# Patient Record
Sex: Female | Born: 1955 | Race: White | Hispanic: No | Marital: Married | Smoking: Former smoker
Health system: Southern US, Community
[De-identification: ages and names within clinical notes are randomized; demographics above are authoritative.]

## PROBLEM LIST (undated history)

## (undated) DIAGNOSIS — E785 Hyperlipidemia, unspecified: Secondary | ICD-10-CM

## (undated) DIAGNOSIS — I890 Lymphedema, not elsewhere classified: Secondary | ICD-10-CM

## (undated) DIAGNOSIS — T4145XA Adverse effect of unspecified anesthetic, initial encounter: Secondary | ICD-10-CM

## (undated) DIAGNOSIS — R079 Chest pain, unspecified: Secondary | ICD-10-CM

## (undated) DIAGNOSIS — C801 Malignant (primary) neoplasm, unspecified: Secondary | ICD-10-CM

## (undated) DIAGNOSIS — K760 Fatty (change of) liver, not elsewhere classified: Secondary | ICD-10-CM

## (undated) DIAGNOSIS — M1990 Unspecified osteoarthritis, unspecified site: Secondary | ICD-10-CM

## (undated) DIAGNOSIS — T8189XA Other complications of procedures, not elsewhere classified, initial encounter: Secondary | ICD-10-CM

## (undated) DIAGNOSIS — M2550 Pain in unspecified joint: Secondary | ICD-10-CM

## (undated) DIAGNOSIS — G4733 Obstructive sleep apnea (adult) (pediatric): Secondary | ICD-10-CM

## (undated) DIAGNOSIS — F419 Anxiety disorder, unspecified: Secondary | ICD-10-CM

## (undated) DIAGNOSIS — K648 Other hemorrhoids: Secondary | ICD-10-CM

## (undated) DIAGNOSIS — M7989 Other specified soft tissue disorders: Secondary | ICD-10-CM

## (undated) DIAGNOSIS — M069 Rheumatoid arthritis, unspecified: Secondary | ICD-10-CM

## (undated) DIAGNOSIS — K439 Ventral hernia without obstruction or gangrene: Secondary | ICD-10-CM

## (undated) DIAGNOSIS — T8781 Dehiscence of amputation stump: Secondary | ICD-10-CM

## (undated) DIAGNOSIS — T8859XA Other complications of anesthesia, initial encounter: Secondary | ICD-10-CM

## (undated) DIAGNOSIS — I519 Heart disease, unspecified: Secondary | ICD-10-CM

## (undated) DIAGNOSIS — K644 Residual hemorrhoidal skin tags: Secondary | ICD-10-CM

## (undated) DIAGNOSIS — K829 Disease of gallbladder, unspecified: Secondary | ICD-10-CM

## (undated) DIAGNOSIS — R0600 Dyspnea, unspecified: Secondary | ICD-10-CM

## (undated) DIAGNOSIS — K460 Unspecified abdominal hernia with obstruction, without gangrene: Secondary | ICD-10-CM

## (undated) DIAGNOSIS — F32A Depression, unspecified: Secondary | ICD-10-CM

## (undated) DIAGNOSIS — I10 Essential (primary) hypertension: Secondary | ICD-10-CM

## (undated) HISTORY — DX: Unspecified abdominal hernia with obstruction, without gangrene: K46.0

## (undated) HISTORY — DX: Residual hemorrhoidal skin tags: K64.4

## (undated) HISTORY — DX: Unspecified osteoarthritis, unspecified site: M19.90

## (undated) HISTORY — DX: Residual hemorrhoidal skin tags: K64.8

## (undated) HISTORY — DX: Heart disease, unspecified: I51.9

## (undated) HISTORY — DX: Hyperlipidemia, unspecified: E78.5

## (undated) HISTORY — DX: Rheumatoid arthritis, unspecified: M06.9

## (undated) HISTORY — DX: Morbid (severe) obesity due to excess calories: E66.01

## (undated) HISTORY — DX: Obstructive sleep apnea (adult) (pediatric): G47.33

## (undated) HISTORY — DX: Other specified soft tissue disorders: M79.89

## (undated) HISTORY — PX: APPLICATION OF WOUND VAC: SHX5189

## (undated) HISTORY — DX: Essential (primary) hypertension: I10

## (undated) HISTORY — DX: Disease of gallbladder, unspecified: K82.9

## (undated) HISTORY — DX: Chest pain, unspecified: R07.9

## (undated) HISTORY — DX: Pain in unspecified joint: M25.50

## (undated) HISTORY — DX: Fatty (change of) liver, not elsewhere classified: K76.0

## (undated) HISTORY — PX: CARDIOVASCULAR STRESS TEST: SHX262

## (undated) HISTORY — DX: Depression, unspecified: F32.A

## (undated) HISTORY — PX: CHOLECYSTECTOMY: SHX55

## (undated) HISTORY — DX: Anxiety disorder, unspecified: F41.9

## (undated) HISTORY — DX: Ventral hernia without obstruction or gangrene: K43.9

---

## 2001-03-15 HISTORY — PX: CORONARY ARTERY BYPASS GRAFT: SHX141

## 2001-04-08 ENCOUNTER — Inpatient Hospital Stay (HOSPITAL_COMMUNITY): Admission: EM | Admit: 2001-04-08 | Discharge: 2001-04-20 | Payer: Self-pay | Admitting: Cardiology

## 2001-04-11 ENCOUNTER — Encounter: Payer: Self-pay | Admitting: Cardiothoracic Surgery

## 2001-04-12 ENCOUNTER — Encounter: Payer: Self-pay | Admitting: Cardiothoracic Surgery

## 2001-04-13 ENCOUNTER — Encounter: Payer: Self-pay | Admitting: Cardiothoracic Surgery

## 2001-04-14 ENCOUNTER — Encounter: Payer: Self-pay | Admitting: Cardiothoracic Surgery

## 2001-04-15 ENCOUNTER — Encounter: Payer: Self-pay | Admitting: Cardiothoracic Surgery

## 2001-04-17 ENCOUNTER — Encounter: Payer: Self-pay | Admitting: Cardiothoracic Surgery

## 2001-04-18 ENCOUNTER — Encounter: Payer: Self-pay | Admitting: Cardiothoracic Surgery

## 2001-04-19 ENCOUNTER — Encounter: Payer: Self-pay | Admitting: Cardiothoracic Surgery

## 2001-05-17 ENCOUNTER — Encounter: Payer: Self-pay | Admitting: Cardiology

## 2001-05-17 ENCOUNTER — Ambulatory Visit (HOSPITAL_COMMUNITY): Admission: RE | Admit: 2001-05-17 | Discharge: 2001-05-17 | Payer: Self-pay | Admitting: Cardiology

## 2001-05-25 ENCOUNTER — Encounter (HOSPITAL_COMMUNITY): Admission: RE | Admit: 2001-05-25 | Discharge: 2001-06-24 | Payer: Self-pay | Admitting: Cardiology

## 2001-06-26 ENCOUNTER — Encounter (HOSPITAL_COMMUNITY): Admission: RE | Admit: 2001-06-26 | Discharge: 2001-07-26 | Payer: Self-pay | Admitting: Cardiology

## 2001-07-28 ENCOUNTER — Encounter (HOSPITAL_COMMUNITY): Admission: RE | Admit: 2001-07-28 | Discharge: 2001-08-27 | Payer: Self-pay | Admitting: Cardiology

## 2001-09-04 ENCOUNTER — Encounter (HOSPITAL_COMMUNITY): Admission: RE | Admit: 2001-09-04 | Discharge: 2001-10-04 | Payer: Self-pay | Admitting: Cardiology

## 2002-04-12 ENCOUNTER — Encounter: Payer: Self-pay | Admitting: Family Medicine

## 2002-04-12 ENCOUNTER — Ambulatory Visit (HOSPITAL_COMMUNITY): Admission: RE | Admit: 2002-04-12 | Discharge: 2002-04-12 | Payer: Self-pay | Admitting: Cardiology

## 2002-04-12 ENCOUNTER — Ambulatory Visit (HOSPITAL_COMMUNITY): Admission: RE | Admit: 2002-04-12 | Discharge: 2002-04-12 | Payer: Self-pay | Admitting: Family Medicine

## 2002-04-19 ENCOUNTER — Ambulatory Visit (HOSPITAL_COMMUNITY): Admission: RE | Admit: 2002-04-19 | Discharge: 2002-04-19 | Payer: Self-pay | Admitting: Family Medicine

## 2002-04-19 ENCOUNTER — Encounter: Payer: Self-pay | Admitting: Family Medicine

## 2003-02-19 ENCOUNTER — Other Ambulatory Visit: Admission: RE | Admit: 2003-02-19 | Discharge: 2003-02-19 | Payer: Self-pay | Admitting: Family Medicine

## 2004-03-31 ENCOUNTER — Other Ambulatory Visit: Admission: RE | Admit: 2004-03-31 | Discharge: 2004-03-31 | Payer: Self-pay | Admitting: Family Medicine

## 2004-04-23 ENCOUNTER — Ambulatory Visit: Payer: Self-pay | Admitting: Cardiology

## 2005-11-08 ENCOUNTER — Other Ambulatory Visit: Admission: RE | Admit: 2005-11-08 | Discharge: 2005-11-08 | Payer: Self-pay | Admitting: Family Medicine

## 2006-07-27 ENCOUNTER — Ambulatory Visit: Payer: Self-pay | Admitting: Cardiology

## 2008-02-21 ENCOUNTER — Ambulatory Visit: Payer: Self-pay | Admitting: Cardiology

## 2008-02-26 ENCOUNTER — Ambulatory Visit: Payer: Self-pay | Admitting: Cardiology

## 2008-02-26 ENCOUNTER — Encounter (HOSPITAL_COMMUNITY): Admission: RE | Admit: 2008-02-26 | Discharge: 2008-03-13 | Payer: Self-pay | Admitting: Cardiology

## 2008-08-29 ENCOUNTER — Encounter (INDEPENDENT_AMBULATORY_CARE_PROVIDER_SITE_OTHER): Payer: Self-pay | Admitting: *Deleted

## 2008-11-12 DIAGNOSIS — I10 Essential (primary) hypertension: Secondary | ICD-10-CM | POA: Insufficient documentation

## 2008-11-12 DIAGNOSIS — E785 Hyperlipidemia, unspecified: Secondary | ICD-10-CM | POA: Insufficient documentation

## 2008-11-12 DIAGNOSIS — E669 Obesity, unspecified: Secondary | ICD-10-CM | POA: Insufficient documentation

## 2008-11-12 DIAGNOSIS — I2510 Atherosclerotic heart disease of native coronary artery without angina pectoris: Secondary | ICD-10-CM | POA: Insufficient documentation

## 2008-11-13 ENCOUNTER — Ambulatory Visit: Payer: Self-pay | Admitting: Cardiology

## 2009-12-10 ENCOUNTER — Ambulatory Visit: Payer: Self-pay | Admitting: Cardiology

## 2010-04-14 NOTE — Assessment & Plan Note (Signed)
Summary: [facility]   Primary Provider:  Paulita Cradle   History of Present Illness: The patient presents for one year followup. Since I last saw her she has done very well. She has had no chest discomfort, neck or arm discomfort. She does not have palpitations, presyncope or syncope. She has no PND or orthopnea. She has just started walking on a treadmill and is not bringing on any symptoms. I reviewed her labs and her cholesterol was not at target recently but she had been off Crestor because of cost. This is now remedied. Her last stress test was 2009.  Current Medications (verified): 1)  Crestor 10 Mg Tabs (Rosuvastatin Calcium) .Marland Kitchen.. 1 By Mouth Daily 2)  Niaspan 1000 Mg Cr-Tabs (Niacin (Antihyperlipidemic)) .Marland Kitchen.. 1 By Mouth Daily 3)  Diovan 160 Mg Tabs (Valsartan) .Marland Kitchen.. 1 By Mouth Daily 4)  Furosemide 20 Mg Tabs (Furosemide) .... 1/2 By Mouth Daily 5)  Fish Oil   Oil (Fish Oil) .... 2 By Mouth Daily 6)  Metoprolol Tartrate 25 Mg Tabs (Metoprolol Tartrate) .... 1/2 By Mouth Bid 7)  Aspirin 81 Mg  Tabs (Aspirin) .Marland Kitchen.. 1 By Mouth Daily  Allergies (verified): 1)  ! Ace Inhibitors  Past History:  Past Medical History: OBESITY, MORBID (ICD-278.01) DYSLIPIDEMIA (ICD-272.4) HYPERTENSION (ICD-401.9) Acute myocardial infarction with a ruptured plaque in the circumflex in 2003 Degenerative joint disease Internal and external hemorrhoids, Anxiety,  Past Surgical History: Four C-sections in the remote past Cholecystectomy CABG in 2003 by Dr. Tyrone Sage.  LIMA to the LAD, free RIMA to the circumflex.  Stress perfusion study December 2009 with no high-risk areas of ischemia.  She has a well-preserved ejection fraction.  Review of Systems       As stated in the HPI and negative for all other systems.   Vital Signs:  Patient profile:   55 year old female Height:      67 inches Weight:      340 pounds BMI:     53.44 Pulse rate:   57 / minute Resp:     18 per minute BP  sitting:   120 / 70  (right arm)  Vitals Entered By: Marrion Coy, CNA (December 10, 2009 11:49 AM)  Physical Exam  General:  Well developed, well nourished, in no acute distress. Head:  normocephalic and atraumatic Eyes:  PERRLA/EOM intact; conjunctiva and lids normal. Neck:  Neck supple, no JVD. No masses, thyromegaly or abnormal cervical nodes. Chest Wall:  Well-healed sternotomy scar Lungs:  Clear bilaterally to auscultation and percussion. Heart:  Non-displaced PMI, chest non-tender; regular rate and rhythm, S1, S2 without murmurs, rubs or gallops. Carotid upstroke normal, no bruit. Normal abdominal aortic size, no bruits. Femorals normal pulses, no bruits. Pedals normal pulses. No edema, no varicosities. Abdomen:  Bowel sounds positive; abdomen soft and non-tender without masses, organomegaly, or hernias noted. No hepatosplenomegaly. Msk:  Back normal, normal gait. Muscle strength and tone normal. Extremities:  No clubbing or cyanosis. Neurologic:  Alert and oriented x 3. Skin:  Intact without lesions or rashes. Cervical Nodes:  no significant adenopathy Inguinal Nodes:  no significant adenopathy Psych:  Normal affect.   EKG  Procedure date:  12/10/2009  Findings:      Sinus rhythm, rate 81, axis within normal limits, intervals within normal limits, RSR prime V1 and V2, no acute ST-T wave changes  Impression & Recommendations:  Problem # 1:  CAD (ICD-414.00) The patient has no new symptoms. No further cardiovascular testing is suggested. She will  continue with risk reduction.  Problem # 2:  OBESITY, MORBID (ICD-278.01) Eye and leg she is exercising. We discussed the need for weight loss with diet and exercise.  Problem # 3:  HYPERTENSION (ICD-401.9) Her blood pressure is controlled and she will continue the meds as listed.  Problem # 4:  DYSLIPIDEMIA (ICD-272.4) She recently started back her Crestor and this will be followed by her primary physician.

## 2010-07-28 NOTE — Assessment & Plan Note (Signed)
[facility]                            CARDIOLOGY OFFICE NOTE   NAME:Julia West, Julia West                  MRN:          [id number]  DATE:11/13/2008                            DOB:          Mar 10, 1956    PRIMARY CARE PHYSICIAN:  Paulita Cradle.   REASON FOR PRESENTATION:  Evaluate the patient with coronary artery  disease.   HISTORY OF PRESENT ILLNESS:  The patient presents for followup of her  known coronary artery disease.  Since I last saw her, she has had no new  problems.  She actually has a treadmill and has started walking 15  minutes a day on this.  With this level of activity, she does not  describe any chest discomfort, neck or arm discomfort.  She does not  report any palpitations, pre syncope or syncope.  She does not have any  PND or orthopnea.  She has had her lipids checked.  Her HDL was slightly  below the target at 32.  Her LDL is slightly above the target at 109.  The triglycerides are slightly high at 175.  However, she had not been  taking her niacin for a while.  She has recently switched to Diovan from  Hyzaar because of cost.  Her blood pressure was controlled at Ms.  Steadman's office, but is a little high here today.   PAST MEDICAL HISTORY:  Coronary artery disease (status post CABG in 2003  by Dr. Tyrone Sage.  LIMA to the LAD, free RIMA to the circumflex.  Stress  perfusion study December 2009 with no high-risk areas of ischemia.  She  has a well-preserved ejection fraction.), acute myocardial infarction  with a ruptured plaque in the circumflex in 2003, morbid obesity,  degenerative joint disease, internal and external hemorrhoids, anxiety,  4 cesarean sections, cholecystectomy.   ALLERGIES:  None.   MEDICATIONS:  1. Niaspan 500 mg b.i.d.  2. Diovan 160 mg daily.  3. Aspirin 325 mg daily.  4. Metoprolol 12.5 mg b.i.d.  5. Crestor 10 mg daily.  6. Lasix 10 mg daily.   REVIEW OF SYSTEMS:  As stated in the HPI and  otherwise negative for all  other systems.   PHYSICAL EXAMINATION:  GENERAL:  The patient is in no distress.  Blood  pressure 154/74, heart rate 60 and regular, weight 350 pounds.  HEENT:  Eyelids are unremarkable, pupils are equal, round and reactive  to light, fundi not visualized, oral mucosa unremarkable.  NECK:  No  jugular venous distention at 45 degrees, carotid upstroke brisk and  symmetric, no bruits, no thyromegaly.  LYMPHATICS:  No cervical, axillary, or inguinal adenopathy.  LUNGS:  Clear to auscultation bilaterally.  BACK:  No costovertebral tenderness.  CHEST:  Unremarkable.  HEART:  PMI not displaced or sustained, S1 and S2 within normal limits,  no S3, no S4, no clicks, no rubs, no murmurs.  ABDOMEN:  Obese, positive bowel sounds.  Normal in frequency and pitch,  no bruits, no rebound, no guarding, no midline pulsatile mass, no  hepatomegaly, no splenomegaly.  SKIN:  No rashes, no nodules.  EXTREMITIES:  2+ upper pulse,  1+ dorsalis pedis and post tibialis  bilaterally, mild bilateral lower extremity edema.  NEURO:  Oriented to person, place and time, cranial nerves II-XII  grossly intact, motor grossly intact.   EKG; sinus rhythm, rate 60, axis within normal limits, intervals within  normal limits, poor anterior R-wave progression, no acute ST-T wave  changes.   ASSESSMENT AND PLAN:  1. Coronary artery disease.  The patient is having no new symptoms.      She had a low-risk stress perfusion study last December.  At this      point, no further cardiovascular testing is suggested.  She needs      to continue with aggressive risk reduction.  2. Dyslipidemia.  Her target is almost there.  I have suggested that      she repeat her labs in about 6 weeks once she has been exercising      back on the niacin.  She can increase the niacin if she has not yet      at target with the above readings.  3. Obesity.  She is going to get on the website and look into the Lap-       Band surgery and see if Medicaid/Medicare would cover this.  This      is a significant problem that needs a drastic solution.  4. Hypertension.  Blood pressure is elevated today, but was not at the      most recent office visit with her primary care.  I have asked her      to keep a reading at home, going to [employer] or [employer].  She may      need to have Diovan HCT substituted if she is not at target.      However, if she loses weight, she probably will be target less than      140/90.  5. Followup.  She will come back in 1 year and I will see her as      needed otherwise.     Rollene Rotunda, MD, [facility]  Electronically Signed    JH/MedQ  DD: 11/13/2008  DT: 11/14/2008  Job #: [id number]   cc:   Paulita Cradle

## 2010-07-28 NOTE — Assessment & Plan Note (Signed)
[facility]                            CARDIOLOGY OFFICE NOTE   NAME:Borawski, BAILI STANG                  MRN:          [id number]  DATE:07/27/2006                            DOB:          1956/02/19    PRIMARY CARE PHYSICIAN:  Paulita Cradle, Nurse Practitioner   REASON FOR PRESENTATION:  Evaluate patient for coronary disease and  cardiomegaly on a recent chest x-ray.   HISTORY OF PRESENT ILLNESS:  The patient returns after about a two year  absence. She has a history of coronary disease as described below. She  recently had a chest x-ray that suggested cardiomegaly, but no other  findings.   The patient has been doing relatively well. She is battling morbid  obesity. Nothing she has tried in the past has allowed her to lose  weight. Her insurance company will not pay for any gastric surgery.  Despite this, she still walks and she has been trying to do this more  and more each day. She walks every day. With this, she does not have any  new shortness of breath. She does not have any resting shortness of  breath. She did not have any PND or orthopnea. She denies any chest  discomfort, neck discomfort, arm discomfort, activity-induced nausea,  vomiting or excessive diaphoresis. She has had no palpitations, pre-  syncope or syncope.   PAST MEDICAL HISTORY:  1. Coronary artery disease (status post coronary artery bypass graft      in 2003, by Dr.  Tyrone Sage, with a Left internal mammary artery      (LIMA) to the left anterior descending artery (LAD), free right      internal mammary to the circumflex).  2. Well-preserved ejection fraction.  3. Acute myocardial infarction with a ruptured plaque in the      circumflex in 2003.  4. Morbid obesity.  5. Degenerative joint disease.  6. Internal and external hemorrhoids.  7. Anxiety.  8. Four Cesarean section.  9. Cholecystectomy.   ALLERGIES:  None.   CURRENT MEDICATIONS:  1. Hyzaar 12.5 mg  daily.  2. Lasix 10 mg daily.  3. Crestor 10 mg daily.  4. Metoprolol 12.5 mg b.i.d.  5. Aspirin 325 mg daily.   REVIEW OF SYSTEMS:  As stated in the HPI and otherwise negative for  other systems.   PHYSICAL EXAMINATION:  The patient is in no distress. Blood pressure  148/84, heart rate 80 and regular.  HEENT: Eyelids unremarkable. Pupils equal, round, and reactive to light.  Fundi not visualized. Oral mucosa is unremarkable.  NECK: No jugular venous distention, waveform within normal limits.  Carotid upstroke brisk and symmetrical. No bruits, no thyromegaly.  LYMPHATICS: No cervical, axillary or inguinal adenopathy.  LUNGS: Clear to auscultation bilaterally.  BACK: No costovertebral angle tenderness.  CHEST: Well-healed sternotomy scar.  HEART: PMI not displaced or sustained. S1, S2 within normal limits. No  S3. No S4. No clicks, rubs or murmurs.  ABDOMEN: Morbidly obese, positive bowel sounds, normal in frequency and  pitch. No bruits. No rebounds. No guarding. No midline pulsatile mass.  No hepatomegaly, splenomegaly.  SKIN: No rashes,  no nodules.  EXTREMITIES: 2+ pulses. Mild bilateral lower extremity edema. No  cyanosis or clubbing.  NEURO: Oriented to person, place and time. Cranial nerves II-XII grossly  intact. Motor grossly intact.   EKG: Sinus rhythm, rate 88. Axis within normal limits. Intervals within  normal limits. No acute ST-T wave changes.   ASSESSMENT/PLAN:  1. Coronary disease. The patient is having no new symptoms. She had a      stress perfusion study ordered by Dr.  Daleen Squibb a couple of years after      her bypass. She has had no new symptoms since then. No further      cardiovascular testing is suggested. She will continue with      secondary risk reduction.  2. Cardiomegaly. This was on chest x-ray. She does not have any      symptoms consistent with reduced ejection fraction. I think we can      follow this clinically without further testing.  3.  Hypertension. Blood pressure is slightly elevated. Hopefully with      her walking she will lose enough weight that it could make an      impact on this slight elevation.  4. Risk reduction. The patient has her lipids followed by Paulita Cradle. The goal is an LDL of less than 100 and HDL greater than      50.  5. Morbid obesity. I would be happy to write letters to the insurance      company as well if anybody thinks it will help to try to get her to      surgery.  6. Followup. I will see her back in 18 months or sooner if needed.     Rollene Rotunda, MD, [facility]     JH/MedQ  DD: 07/27/2006  DT: 07/27/2006  Job #: [id number]   cc:   Paulita Cradle, FNP

## 2010-07-28 NOTE — Assessment & Plan Note (Signed)
[facility]                            CARDIOLOGY OFFICE NOTE   NAME:Ivanoff, MARKITA STCHARLES                  MRN:          [id number]  DATE:02/21/2008                            DOB:          12-Apr-1955    PRIMARY CARE PHYSICIAN:  Paulita Cradle, NP   REASON FOR PRESENTATION:  Patient with coronary disease.   HISTORY OF PRESENT ILLNESS:  The patient returns for followup 18 months  after her last visit.  Since I last saw her, she has had no new  cardiovascular complaints.  She is still not able to be as active as I  would hope, limited by joint pains predominantly.  She does walk her  dogs however.  She probably gets about a half-hour a day at a slower  pace.  She did swim in her daughter's pool this summer.  With this level  of activity, she does not describe any chest discomfort, neck or arm  discomfort.  She does not describe any palpitation or presyncope or  syncope.  She has not had any PND or orthopnea.  She did have to have  her med switched recently as her insurance company would not cover  Hyzaar.  Her blood pressure was up a little bit, but she is now on  Diovan responding well.   PAST MEDICAL HISTORY:  1. Coronary artery disease (status post CABG in 2003 by Dr. Tyrone Sage      with a LIMA to the LAD, free RIMA to the circumflex), well      preserved ejection fraction.  2. Acute myocardial infarction with ruptured plaque in the circumflex      in 2003.  3. Morbid obesity.  4. Degenerative joint disease.  5. Internal and external hemorrhoids.  6. Anxiety.  7. Four cesarean sections.  8. Cholecystectomy.   ALLERGIES:  None.   MEDICATIONS:  1. Niaspan 500 mg b.i.d.  2. Diovan 160 mg daily.  3. Aspirin 325 mg daily.  4. Metoprolol 12.5 mg b.i.d.  5. Crestor 10 mg daily.  6. Lasix 10 mg daily.   REVIEW OF SYSTEMS:  As stated in the HPI and otherwise negative for all  other systems.   PHYSICAL EXAMINATION:  GENERAL:  The patient is  pleasant and in no  distress.  VITAL SIGNS:  Blood pressure 140/66, heart rate 68 and regular, weight  greater than 350, body mass index greater than 52.  HEENT:  Eyes are unremarkable.  Pupils equal round and reactive to  light, fundi not visualized, oral mucosa unremarkable.  NECK:  No jugular venous distension at 45 degrees, carotid upstroke  brisk and symmetrical, no bruits, no thyromegaly.  LYMPHATICS:  No cervical, axillary, inguinal adenopathy.  LUNGS:  Clear to auscultation bilaterally.  BACK:  No costovertebral angle tenderness.  CHEST:  Unremarkable, except for a well-healed surgical scar.  HEART:  PMI not displaced or sustained, S1 and S2 within normal limits,  no S3, no S4, no clicks, no rubs, no murmurs.  ABDOMEN:  Morbidly obese, positive bowel sounds, normal in frequency and  pitch.  No bruits, no rebound, and no guarding or midline  pulsatile  mass.  No hepatomegaly, no splenomegaly.  SKIN:  No rashes, no nodules.  EXTREMITIES:  2+ pulses throughout, no edema, no cyanosis or clubbing.  NEUROLOGIC:  Oriented to person, place, and time.  Cranial nerves II  through XII grossly intact.  Motor grossly intact.   EKG:  Sinus rhythm, rate 68, axis within normal limits, intervals within  normal limits, no acute ST-T wave changes.   ASSESSMENT AND PLAN:  1. Coronary artery disease.  Patient has had coronary artery disease      with bypass grafting 6 years ago.  She has not had stress perfusion      testing since that time.  She is not particularly active.  Given      this, screening with a stress test is indicated.  She would not be      able to walk on a treadmill, so she will need an adenosine      Cardiolite.  Further evaluation based on these results.  2. Hypertension.  Blood pressure is well controlled.  She will      continue the medications as listed.  3. Dyslipidemia.  She recently had niacin added to raise her HDL.      This was excellent combination therapy and I  will defer to Ms.      Steadman with a goal HDL greater than 50 and HDL less than 70.  4. Morbid obesity.  We discussed again trying to find some kind of      exercise that she can do more routinely.  I would suggest a [facility] Diet for weight loss.  5. Followup.  I will see her back in 1 year or sooner if needed.     Rollene Rotunda, MD, [facility]  Electronically Signed    JH/MedQ  DD: 02/21/2008  DT: 02/22/2008  Job #: [id number]   cc:   Paulita Cradle, NP

## 2010-07-31 NOTE — Discharge Summary (Signed)
[place]. [facility]  Patient:    Julia West, Julia West Visit Number: [id number] MRN: [id number]          Service Type: MED Location: 2000 2037 01 Attending Physician:  Waldo Laine Dictated by:   Dominica Severin, P.A. Admit Date:  04/08/2001 Discharge Date: 04/20/2001   CC:         Colon Flattery, D.O.  Thomas C. Wall, M.D. [facility]  CVTS   Discharge Summary  DATE OF BIRTH:  [birth year].  PRIMARY ADMISSION DIAGNOSIS:  Myocardial infarction.  SECONDARY DIAGNOSES/PAST MEDICAL HISTORY: 1. Anxiety diagnosed April 07, 2001. 2. Status post cholecystectomy. 3. Status post cesarean sections x3. 4. History of tobacco abuse x25 years. 5. No known drug allergies.  DISCHARGE DIAGNOSES: 1. Two vessel coronary artery disease, status post coronary artery bypass    graft surgery. 2. Oral Candidiasis. 3. Postoperative febrile illness secondary to lung infection.  BRIEF HISTORY:  This is a 55 year old Caucasian female with episode of chest pain on April 07, 2001, after a stressful event at work evaluated by primary doctor, diagnosed with anxiety and was treated with medications.  On April 08, 2001, she experienced substernal chest pain with shortness of breath, diaphoresis while at rest. EMS took her to [facility].  Chest pain was relieved with nitroglycerin x1 en route.  EKG showed normal sinus rhythm with ST depression.  Troponin was elevated at 0.73.  She was transferred to [facility]. [facility] for further evaluation.  On arrival to [facility], she was evaluated by Rollene Rotunda, M.D.  HOSPITAL COURSE:  On EKG she had ST depression which was resolved.  She was pain-free.  She was hemodynamically stable with hypertension.  Heparin drip was discontinued and Integrelin drip was started.  Also, beta-blocker and ACE I and aspirin.  She was scheduled for a cardiac catheterization. She was ruled in for a  non-Q-wave myocardial infarction. On April 10, 2001, a heart catheterization revealed two vessel coronary artery disease with preserved left ventricular function.  Surgical consult was obtained by Ramon Dredge B. Tyrone Sage, M.D., who recommended undergoing coronary artery bypass graft surgery. The patient had agreed to continue.  She had pre-CABG Dopplers done on April 10, 2001, which revealed no significant carotid disease.  She had palpable pulses bilaterally in her lower extremities.  On April 11, 2001, she underwent a coronary artery bypass graft surgery x2 with left internal mammary artery to left anterior descending artery and the right internal mammary artery free graft to the circumflex branch. She tolerated the surgery well. She was transferred stable to the SICU.  She remained hemodynamically stable.  Her postoperative course was notable for a transient increase in her creatinine to 2.1.  On postop day #2, it was normal; on postop day #3, down to 1.0. She did have some pulmonary congestion. The sputum culture showed normal oropharyngeal flora. She responded to diuresis.  She remained oxygen dependent until the a.m. of postop day #6. Her respiratory distress had improved with incentive spirometer and decreased mobility.  Her oxygen saturation was 85 to 88% on room air. She also had postoperative anemia which required transfusion with hemoglobin of 6.9 and hematocrit of 21.5.  On postop day #4 she was asymptomatic.  She was transfused two units of packed red blood cells.  Postop day #5, her hemoglobin was up to 8.8 and her hematocrit was 26.3.  She had a smoking cessation counsel and the patient expressed desire to quit. She  was then also diagnosed with oral Candidiasis, treated with Mycostatin mouthwash. She was anticipated for discharge initially on April 17, 2001, although she had an increased temperature of 102.5 on postop day #6; therefore, her discharge was held.  Cultures  were sent on sputum, blood culture and urine. Urine was within normal limits.  Her sputum cultures grew gram positive cocci and gram negative rods, although initially preliminary reports stated that it was normal flora.  Her final culture also noted that she had normal flora.  In addition, her final urine culture, although it is preliminary report, noted a Staphylococcus species 40,000 colonies on the specimen obtained on April 17, 2001.  Blood cultures were without growth.  Since that day, she had another spike in her temperature on April 18, 2001, and it was anticipated that she would go home on April 19, 2001, although she was still oxygen dependent and she was started on IV vancomycin and she was on p.o. ciprofloxacin. She continued to ambulate, continued to work on her pulmonary toilet and incentive spirometer.  Her wound remained clean and dry.  She was tolerating her diet without difficulty.  She was later changed to p.o. Keflex and her vancomycin was discontinued.  On postop day #9, April 23, 2001, she was discharged home. She was tolerating room air oxygen saturations greater than 90%.  She was sent home on p.o. antibiotics both for her lungs as well as her urinary tract infection.  CONDITION ON DISCHARGE:  Stable.  She otherwise remained hemodynamically stable throughout her stay.  DISPOSITION:  She is to be discharged home.  DISCHARGE MEDICATIONS:  1. Enteric coated aspirin 325 mg daily.  2. Colace 100 mg tablet two tablets daily.  She was to hold for loose stools.  3. Tylox one or two tablets every four to six hours as needed for pain.  4. Mycostatin mouthwash 10 mm swish and swallow three times a day.  5. Ferrous sulfate 325 mg one daily with food.  6. Zoloft 50 mg daily.  7. Folic acid 1 mg daily.  8. Atenolol 25 mg take half tablet once a day.   9. Keflex 500 mg one tablet three times a day. 10. Ciprofloxacin 500 mg one tablet three times a day.  ACTIVITY:   She is instructed not to do any driving, no heavy lifting more than 10 pounds.  She is to continue her breathing exercises and her daily walks. DISCHARGE INSTRUCTIONS:  She is not do do any smoking.  DIET:  She is to follow a low fat, low sodium diet.  WOUND CARE:  She was told she could shower, clean her wounds with mild soap and water and call our office if any wound problems arise as noted on fact sheet.  FOLLOW-UP:  She is to see Maisie Fus C. Daleen Squibb, M.D., in [place], [place], on May 04, 2001, at 12 p.m.  She is to have a chest x-ray taken at [facility] on May 11, 2001, at 8:30 a.m.  Bring that chest x-ray with her to appointment with Dr. Tyrone Sage on May 11, 2001, at 9:40 a.m. Dictated by:   Dominica Severin, P.A. Attending Physician:  Waldo Laine DD:  04/20/01 TD:  04/21/01 Job: [id number]

## 2010-07-31 NOTE — Op Note (Signed)
[place]  Patient:    Julia West, Julia West Visit Number: [id number] MRN: [id number]          Service Type: MED Location: 2300 2314 01 Attending Physician:  Waldo Laine Dictated by:   Gwenith Daily Tyrone Sage, M.D. Proc. Date: 04/11/01 Admit Date:  04/08/2001   CC:         Arturo Morton. Riley Kill, M.D. [facility]   Operative Report  PREOPERATIVE DIAGNOSIS: Coronary occlusive disease, with acute myocardial infarction.  POSTOPERATIVE DIAGNOSIS: Coronary occlusive disease, with acute myocardial infarction.  OPERATION/PROCEDURE: Coronary artery bypass grafting x2, with left internal mammary to left anterior descending coronary artery and free right internal mammary graft to the circumflex coronary artery.  SURGEON: Gwenith Daily. Tyrone Sage, M.D.  ASSISTANT: Mikey Bussing, M.D.  INDICATIONS FOR PROCEDURE: The patient is a 55 year old female, with strong family history and family history of coronary disease, who presented with an episode of prolonged chest pain which started while she was at work.  She was transferred to [facility] and noted to have elevated enzymes.  She was stabilized medically and underwent catheterization by Dr. Riley Kill.  At the time of catheterization the patient had some luminal irregularities at the right coronary artery, which was a large vessel but without any obstruction.  She had a probable area of lesion in the proximal circumflex that appeared to be ruptured plaque and haziness, with 80% stenosis.  In addition, in the ostium of the takeoff of the left anterior descending coronary artery there was a 70-80% stenosis.  Because of the patients symptoms and critical anatomy involving both the LAD and the circumflex, which was unsuitable for angioplasty, coronary artery bypass grafting was recommended.  The patient agreed and signed informed consent.  DESCRIPTION OF PROCEDURE: With Swan-Ganz and  arterial line monitors in place, the patient underwent general endotracheal anesthesia without incident.  The skin of the chest and legs was prepped with Betadine and draped in the usual sterile manner.  A left median sternotomy was performed.  The left internal mammary artery was dissected down as a pedicle graft.  The distal artery was divided and had good free flow.  In similar fashion, the right internal mammary artery was dissected down as a pedicle graft.  The distal artery was divided and had good free flow.  Both of the vessels had been hydrostatically dilated with heparinized saline.  The pericardium was then opened.  The patient appeared to have overall preserved LV function, though she did have somewhat enlarged ventricle.  Her obesity of 280 pounds with a height of 5 feet 7 inches made consideration for off-pump bypass difficult because of her enlarged heart and anatomy.  She was systemically heparinized.  The ascending aorta and the right atrium were cannulated in the aortic root.  A bent cardioplegic needle was introduced into the ascending aorta.  The patient was placed on cardiopulmonary bypass, 2.4 liters/m/sq m.  The sites of anastomosis were selected and discussed out of the epicardium.  The patients body temperature was cooled to 32 degrees.  The aortic crossclamp was applied and 500 cc of cold blood cardioplegia administered with rapid diastolic arrest of the heart.  Myocardial septal temperature was monitored throughout the crossclamp.  Attention was turned first to the circumflex coronary artery, which was opened an admitted a 1.5 mm probe.  Using a running 8-0 Prolene the right internal mammary artery, which had been removed as a free graft, was anastomosed to the  distal circumflex.  In a similar fashion, the left anterior descending coronary artery was then opened between the mid and distal third, and using a running 8-0 Prolene the left internal mammary artery  was anastomosed to the left anterior descending coronary artery.  With release of the Edwards bulldog on the mammary artery there was appropriate rise in myocardial septal temperature.  The aortic crossclamp was removed, with total crossclamp time 40 minutes.  The patient spontaneously converted to a sinus rhythm.  A partial occlusion clamp was placed on the ascending aorta and a single punch aortotomy was performed to relatively normal aorta.  The proximal anastomosis with the right internal mammary to the aorta was then performed with a running 7-0 Prolene.  Air was evacuated from the grafts and the partial occlusion clamp was removed.  The patient was rewarmed.  The sites of anastomosis were inspected and were free of bleeding.  The patient was then ventilated and weaned from cardiopulmonary bypass without difficulty. Remained hemodynamically stable.  Was decannulated in the usual fashion. Protamine sulfate was administered and with the operative field hemostatic two atrial and two ventricular pacing wires were applied.  Graft markers were applied.  A left pleural tube and two mediastinal tubes were left in place. The sternum was closed with #6 stainless steel wire.  The fascia was closed with interrupted 0 Vicryl, running 3-0 Vicryl in subcutaneous tissue, 4-0 subcuticular stitch in the skin edges.  Dry dressings were applied.  Sponge and needle counts were reported as correct at the completion of the procedure. The patient tolerated the procedure without obvious complication and was transferred to the surgical intensive care unit for further postoperative care. Dictated by:   Gwenith Daily Tyrone Sage, M.D. Attending Physician:  Waldo Laine DD:  04/12/01 TD:  04/13/01 Job: [id number]

## 2010-07-31 NOTE — H&P (Signed)
[facility]  Patient:    Julia West, Julia West Visit Number: [id number] MRN: [id number]          Service Type: MED Location: CCUA 2927 01 Attending Physician:  Mirian Mo Dictated by:   Heloise Beecham, M.D. [facility] Admit Date:  04/08/2001   CC:         Colon Flattery, D.O., [place], [place]   History and Physical  PRIMARY CARE PHYSICIAN:  Dr. Colon Flattery, [place], [place].  CHIEF COMPLAINT:  Chest pain.  HISTORY OF PRESENT ILLNESS:  This is a 55 year old white female who had an argument with her boss one day prior to admission and became very stressed and anxious.  During this episode she was noted to be very hypertensive and had substernal chest pain lasting only a few minutes.  She was seen by her primary care physician, who diagnosed her with anxiety and started Zoloft and Xanax and noted a blood pressure of 180/100.  Today, on the day of admission, she was taking it easy at home when at 5 p.m. at rest she had crushing substernal chest pain associated with shortness of breath and diaphoresis.  The pain slowly increased to a point where she called 911.  En route to [facility] ER she received one sublingual nitroglycerin with complete resolution at chest pain. At [facility] ED an EKG showed normal sinus rhythm with ST depression in V4 through V6.  She had an elevated troponin I and was sent to [facility] for further evaluation.  PAST MEDICAL HISTORY: 1. Anxiety, diagnosed one day prior to admission. 2. Four C-sections in the remote past. 3. Cholecystectomy.  ALLERGIES:  No known drug allergies.  SOCIAL HISTORY:  She lives in [place], [place], with her husband. 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  She is married, with five children, one of whom has pulmonary stenosis.  She has a 25-pack-year history of smoking.  Her only exercise is at work.  She does not drink or do drugs.  FAMILY HISTORY:  Her mother died at age 57 with  an MI and CHF.  Her father died of suicide at age 57.  She has a sister, age 31, with hypertension and a brother, age 44, who is healthy.  MEDICATIONS: 1. Zoloft 50 mg q.d. 2. Xanax 0.5 mg, 1/2 to 1 tablet b.i.d. p.r.n.  REVIEW OF SYSTEMS:  Otherwise negative except as noted in history of present illness.  PHYSICAL EXAMINATION:  VITAL SIGNS:  Temperature 37.4, pulse 60, respiratory rate 15, blood pressure 185/75.  Weight 262 pounds.  Saturation of 97% on room air.  GENERAL:  Obese white female in no apparent distress.  Currently having no chest pain.  HEENT:  Normocephalic, atraumatic.  PERRLA.  EOMI.  Sclerae are clear.  Mucous membranes wet.  She has dentures.  Oropharynx, oral cavity clear.  NECK:  Supple.  Without lymphadenopathy.  No bruits, no JVD.  CARDIOVASCULAR:  Regular rate and rhythm.  No murmurs, rubs, or gallops. Normal S1, S2.  No S3 or S4.  PMI is normal.  Pulses are 2+ throughout and equal.  No bruits.  LUNGS:  Clear to auscultation bilaterally.  With no wheezes, rales, or rhonchi.  SKIN:  No rash.  No other lesions.  ABDOMEN:  Soft, nontender, nondistended.  No rebound or guarding.  No hepatosplenomegaly.  EXTREMITIES:  No cyanosis, clubbing, or edema.  No rashes, lesions, or petechiae.  NEUROLOGIC:  Alert and oriented x3.  Cranial nerves II-XII grossly intact.  LABORATORY DATA:  Chest x-ray is a poor AP film with no acute process.  EKG:  Rate 55, rhythm is normal sinus rhythm.  Axis is normal.  She has mild lateral ST depression.  No ST elevation.  White count 10.3, hemoglobin 12.4, hematocrit 38, platelet count 282.  Sodium 139, potassium 4.5, chloride 109, CO2 27, BUN 14, creatinine 0.9, glucose 121. CK 217, MB 13.6, troponin I 0.73.  PTT 24.6, PT 12.3, INR 1.1.  ASSESSMENT:  The patient is a 55 year old white female who is premenopausal and has a positive smoking history who presents with a non-ST elevation MI after two episodes of chest pain  in the last two days.  Her EKG at the [facility] ED is described above.  On arrival to [facility], her ST depression has resolved, and she is pain-free.  She is currently hemodynamically stable but has hypertension.  Given the fact that she has positive enzymes, positive EKG changes at [facility], we will start a 2B-3A inhibitor now and begin preparations for evaluation of her coronary anatomy.  Actually, after admission to the CCU the patient had a several-beat run of nonsustained VT without any chest pain or change in blood pressure.  During this episode she was asymptomatic.  PLAN: 1. Cardiovascular plan:  The patient is to be n.p.o. after midnight for    catheterization.  We will continue to rule out MI with enzymes.  We will    start an Integrilin drip.  Given that she has positive enzymes I will    obtain a lipid panel and start a beta blocker, ACE inhibitor, and aspirin.    We will continue her heparin for this time. 2. Anxiety:  We will continue her previous medications. 3. Smoking cessation. Dictated by:   Heloise Beecham, M.D. [facility] Attending Physician:  Mirian Mo DD:  04/09/01 TD:  04/09/01 Job: [id number]

## 2010-07-31 NOTE — Cardiovascular Report (Signed)
[place]. [facility]  Patient:    Julia West, Julia West Visit Number: [id number] MRN: [id number]          Service Type: MED Location: CCUA 2927 01 Attending Physician:  Mirian Mo Dictated by:   Arturo Morton Riley Kill, M.D. [facility] Proc. Date: 04/10/01 Admit Date:  04/08/2001   CC:         Colon Flattery, D.O.  Thomas C. Wall, M.D. [facility]  Cardiac Catheterization Lab   Cardiac Catheterization  INDICATIONS:  Ms. [name] is a 55 year old who presents with non-ST elevation myocardial infarction.  She is a smoker.  She is brought to the catheterization lab for further evaluation.  PROCEDURES: 1. Left heart catheterization. 2. Selective coronary arteriography. 3. Selective left ventriculography. 4. Subclavian angiography.  DESCRIPTION OF PROCEDURE:  The procedure was performed from the right femoral artery using #6 French catheters.  She tolerated the procedure without complication.  She was taken to the holding area in satisfactory clinical condition.  She was a little bit restless during the procedure and several doses of benzodiazepine were administered to try to calm her.  HEMODYNAMIC DATA: 1. Central aorta 160/80 (111 mean). 2. Left ventricle 161/6/14. 3. No aortic to left ventricular gradient.  ANGIOGRAPHIC DATA: 1. Left ventriculography was performed in the RAO and LAO projections.    Ejection fraction was preserved.  In the RAO projection, it was    calculated at 53% which is borderline low but it appeared normal in the    LAO projection.  There was some diastolic mitral regurgitation but there    did not appear to be significant systolic regurgitation in either view. 2. There is generalized coronary calcification which is moderate. 3. The left main has some tapered narrowing distally.  There is about 30-40%.    There was also slight damping of the catheter with each engagement;    however, no ostial lesion was noted. 4. The LAD  demonstrates about a 70% ostial stenosis best seen in the LAO    caudal views.  There is also about 30-40% narrowing in the LAD just beyond    the origin of the large diagonal with a second area of about 30-40%    narrowing after the septal perforator.  The distal wraps the apex and    appears to be a graftable vessel. 5. The circumflex provides a tiny first marginal branch which is free of    critical disease.  The AV circumflex has a 70% stenosis and there is a    50-70% area of hypodensity in the mid vessel suggesting ruptured plaque.    The distal vessel is suitable for grafting. 6. The right coronary artery is a dominant vessel that has about 20-30% mid    narrowing.  The PDA and posterolateral system are without critical disease.  CONCLUSIONS: 1. Preserved left ventricular function. 2. Probable ruptured plaque in the mid circumflex coronary. 3. High-grade ostial stenosis of the left anterior descending artery.  DISPOSITION:  The LAD is not ideal for percutaneous intervention.  We will get surgery to see her to potentially consider the options given the location of the LAD lesion.  She will need substantial lifestyle changes if she is to do well over the long haul.Dictated by:   Arturo Morton Riley Kill, M.D. [facility] Attending Physician:  Mirian Mo DD:  04/10/01 TD:  04/10/01 Job: [id number]

## 2010-07-31 NOTE — Procedures (Signed)
   NAME:  Julia West, Julia West                     ACCOUNT NO.:  [id number]   MEDICAL RECORD NO.:  [id number]                   PATIENT TYPE:  OUT   LOCATION:  RAD                                  FACILITY:  [facility]   PHYSICIAN:  Griffin Bing, M.D. [facility]           DATE OF BIRTH:  [birth year]   DATE OF PROCEDURE:  04/12/2002                  AGE:  55  DATE OF DISCHARGE:  04/12/2002                  SEX:  F                                CARDIAC ULTRASOUND   REFERRING PHYSICIANS:  Dr. Reola Calkins from [facility] and  Jesse Sans. Wall, M.D. [facility]   CLINICAL INFORMATION:  A 55 year old woman with prior CABG surgery; now with  chest pain.   M-MODE:  AORTA:  2.6  (<4.0)  LEFT ATRIUM:  4.2  (<4.0)  SEPTUM:  1.4  (0.7-1.1)  POSTERIOR WALL:  1.4  (0.7-1.1)  LV-DIASTOLE:  4.3  (<5.7)  LV-SYSTOLE:  3.8  (<4.0)   FINDINGS/IMPRESSION:  1. A technically difficult and somewhat limited echocardiographic study.  2. Normal left atrium, right atrium and right ventricle.  3. Normal mitral and tricuspid valves; aortic valve not well imaged, but     probably normal.  4. LV systolic function assessed with the assistance of intravenous     echocardiographic contrast.  5. Left ventricular size was normal.  There was borderline LVH.  Not all     myocardial segments adequately imaged--no areas of akinesis nor     dyskinesis.  Overall LV systolic function appeared normal.                                               Cumby Bing, M.D. [facility]    RR/MEDQ  D:  04/13/2002  T:  04/14/2002  Job:  [id number]   cc:   Dr. Reola Calkins  [facility] C. Wall, M.D. [facility]  [address]  [place]  [place] [address]  Fax: 1

## 2010-10-20 ENCOUNTER — Encounter: Payer: Self-pay | Admitting: Cardiology

## 2010-11-17 ENCOUNTER — Encounter: Payer: Self-pay | Admitting: Cardiology

## 2010-11-18 ENCOUNTER — Encounter: Payer: Self-pay | Admitting: Cardiology

## 2010-11-18 ENCOUNTER — Ambulatory Visit (INDEPENDENT_AMBULATORY_CARE_PROVIDER_SITE_OTHER): Payer: Medicare Other | Admitting: Cardiology

## 2010-11-18 DIAGNOSIS — I2510 Atherosclerotic heart disease of native coronary artery without angina pectoris: Secondary | ICD-10-CM

## 2010-11-18 DIAGNOSIS — I10 Essential (primary) hypertension: Secondary | ICD-10-CM

## 2010-11-18 DIAGNOSIS — E785 Hyperlipidemia, unspecified: Secondary | ICD-10-CM

## 2010-11-18 NOTE — Progress Notes (Signed)
HPI The patient presents for follow of CAD.  Since I last saw her she has done well.  The patient denies any new symptoms such as chest discomfort, neck or arm discomfort. There has been no new shortness of breath, PND or orthopnea. There have been no reported palpitations, presyncope or syncope.  She is exercising three times per week. She is getting ready for her daughters wedding.  She is also planning to have bariatric surgery.  Allergies  Allergen Reactions  . Ace Inhibitors     Current Outpatient Prescriptions  Medication Sig Dispense Refill  . aspirin 81 MG tablet Take 81 mg by mouth daily.        . diclofenac (VOLTAREN) 75 MG EC tablet Take 75 mg by mouth 2 (two) times daily.        . fish oil-omega-3 fatty acids 1000 MG capsule Take 2 g by mouth daily.        . furosemide (LASIX) 20 MG tablet Take 10 mg by mouth daily.        . metoprolol (LOPRESSOR) 50 MG tablet Take 50 mg by mouth 2 (two) times daily.        . niacin (NIASPAN) 1000 MG CR tablet Take 1,000 mg by mouth at bedtime.        . rosuvastatin (CRESTOR) 10 MG tablet Take 10 mg by mouth daily.        . valsartan-hydrochlorothiazide (DIOVAN-HCT) 160-12.5 MG per tablet Take 1 tablet by mouth daily.          Past Medical History  Diagnosis Date  . Morbid obesity   . Other and unspecified hyperlipidemia   . Unspecified essential hypertension   . Acute myocardial infarction     with a ruptured plaque in the circumflex in 2003  . Degenerative joint disease   . Internal and external hemorrhoids without complication   . Anxiety     Past Surgical History  Procedure Date  . Cesarean section     x 4 in remote past  . Cholecystectomy   . Coronary artery bypass graft 2003    by Dr. Tyrone Sage. LIMA to the LAD, free RIMA to the circumflex. Stress perfusion study December 2009 with no high-risk areas of ischemia. She has a well-preserved ejection fraction    ROS:  As stated in the HPI and negative for all other  systems.  PHYSICAL EXAM BP 144/68  Pulse 62  Resp 18  Ht 5\' 6"  (1.676 m)  Wt 345 lb (156.491 kg)  BMI 55.68 kg/m2 GENERAL:  Well appearing HEENT:  Pupils equal round and reactive, fundi not visualized, oral mucosa unremarkable NECK:  No jugular venous distention, waveform within normal limits, carotid upstroke brisk and symmetric, no bruits, no thyromegaly LYMPHATICS:  No cervical, inguinal adenopathy LUNGS:  Clear to auscultation bilaterally BACK:  No CVA tenderness CHEST:  Well healed sternotomy scar. HEART:  PMI not displaced or sustained,S1 and S2 within normal limits, no S3, no S4, no clicks, no rubs, no murmurs ABD:  Flat, positive bowel sounds normal in frequency in pitch, no bruits, no rebound, no guarding, no midline pulsatile mass, no hepatomegaly, no splenomegaly, obese EXT:  2 plus pulses throughout, no edema, no cyanosis no clubbing SKIN:  No rashes no nodules NEURO:  Cranial nerves II through XII grossly intact, motor grossly intact throughout PSYCH:  Cognitively intact, oriented to person place and time  EKG:  Sinus rhythm, rate 61, left axis deviation RSR V1 and V2, no acute ST-T  wave changes.  ASSESSMENT AND PLAN

## 2010-11-18 NOTE — Assessment & Plan Note (Signed)
The patient has no new sypmtoms.  No further cardiovascular testing is indicated.  We will continue with aggressive risk reduction and meds as listed.  

## 2010-11-18 NOTE — Assessment & Plan Note (Signed)
I encouraged her to go ahead with bariatric surgery planning.  She would need a stress test prior to the surgery and she will contact me in the months ahead if this surgery is planned

## 2010-11-18 NOTE — Patient Instructions (Addendum)
The current medical regimen is effective;  continue present plan and medications.  Follow up in 1 year with Dr Hochrein.  You will receive a letter in the mail 2 months before you are due.  Please call us when you receive this letter to schedule your follow up appointment.  

## 2010-11-18 NOTE — Assessment & Plan Note (Signed)
The blood pressure is at target. No change in medications is indicated. We will continue with therapeutic lifestyle changes (TLC).  

## 2010-11-18 NOTE — Assessment & Plan Note (Signed)
Her lipids are at target.  I reviewed these.

## 2012-01-03 ENCOUNTER — Other Ambulatory Visit: Payer: Self-pay | Admitting: Family Medicine

## 2012-01-03 DIAGNOSIS — R1904 Left lower quadrant abdominal swelling, mass and lump: Secondary | ICD-10-CM

## 2012-01-04 ENCOUNTER — Ambulatory Visit (HOSPITAL_COMMUNITY)
Admission: RE | Admit: 2012-01-04 | Discharge: 2012-01-04 | Disposition: A | Discharge status: Home / Self Care | Payer: Medicare Other | Source: Ambulatory Visit | Attending: Family Medicine | Admitting: Family Medicine

## 2012-01-04 DIAGNOSIS — R1904 Left lower quadrant abdominal swelling, mass and lump: Secondary | ICD-10-CM

## 2012-01-04 DIAGNOSIS — K439 Ventral hernia without obstruction or gangrene: Secondary | ICD-10-CM | POA: Insufficient documentation

## 2012-01-06 ENCOUNTER — Encounter (INDEPENDENT_AMBULATORY_CARE_PROVIDER_SITE_OTHER): Payer: Self-pay | Admitting: General Surgery

## 2012-01-06 ENCOUNTER — Ambulatory Visit (INDEPENDENT_AMBULATORY_CARE_PROVIDER_SITE_OTHER): Payer: Medicare Other | Admitting: General Surgery

## 2012-01-06 VITALS — BP 148/88 | HR 72 | Temp 95.0°F | Resp 18 | Ht 67.0 in | Wt 327.2 lb

## 2012-01-06 DIAGNOSIS — K432 Incisional hernia without obstruction or gangrene: Secondary | ICD-10-CM

## 2012-01-06 NOTE — Progress Notes (Signed)
Patient ID: Julia West, female   DOB: 1955-09-09, 56 y.o.   MRN: [id number]  No chief complaint on file.   HPI Julia West is a 56 y.o. female he is referred for a incarcerated incisional hernia. Patient states that approximately 2 weeks ago patient began having abdominal pain and a firm abdomen with rigidity. Patient presented to her PCP and underwent MRI which revealed incarcerated incisional hernia.  Since the patient is symptoms and did not have any nausea vomiting at that time. Patient does have history of having a CABG x2 in 2003. She states she has not had a stress test about 6-7 years. I spoke with her daughter Graciella Belton regarding her incarcerated hernia as well as the patient in regards to emerge situation which require fixation. HPI  Past Medical History  Diagnosis Date  . Morbid obesity   . Other and unspecified hyperlipidemia   . Unspecified essential hypertension   . Acute myocardial infarction     with a ruptured plaque in the circumflex in 2003  . Degenerative joint disease   . Internal and external hemorrhoids without complication   . Anxiety     Past Surgical History  Procedure Date  . Cesarean section     x 4 in remote past  . Cholecystectomy   . Coronary artery bypass graft 2003    by Dr. Tyrone Sage. LIMA to the LAD, free RIMA to the circumflex. Stress perfusion study December 2009 with no high-risk areas of ischemia. She has a well-preserved ejection fraction    Family History  Problem Relation Age of Onset  . Heart attack Mother   . Hypertension Sister     Social History History  Substance Use Topics  . Smoking status: Former Smoker -- 1.0 packs/day for 25 years    Types: Cigarettes  . Smokeless tobacco: Not on file  . Alcohol Use: No    Allergies  Allergen Reactions  . Ace Inhibitors     Current Outpatient Prescriptions  Medication Sig Dispense Refill  . aspirin 81 MG tablet Take 81 mg by mouth daily.        . diclofenac (VOLTAREN)  75 MG EC tablet Take 75 mg by mouth 2 (two) times daily.        . fish oil-omega-3 fatty acids 1000 MG capsule Take 2 g by mouth daily.        . furosemide (LASIX) 20 MG tablet Take 10 mg by mouth daily.        . metoprolol (LOPRESSOR) 50 MG tablet Take 50 mg by mouth 2 (two) times daily.        . niacin (NIASPAN) 1000 MG CR tablet Take 1,000 mg by mouth at bedtime.        . rosuvastatin (CRESTOR) 10 MG tablet Take 10 mg by mouth daily.        . valsartan-hydrochlorothiazide (DIOVAN-HCT) 160-12.5 MG per tablet Take 1 tablet by mouth daily.          Review of Systems Review of Systems  Blood pressure 148/88, pulse 72, temperature 95 F (35 C), temperature source Temporal, resp. rate 18, height 5\' 7"  (1.702 m), weight 327 lb 3.2 oz (148.417 kg).  Physical Exam Physical Exam  Data Reviewed MRI of the abdominal wall reveals a large umbilical anterior abdominal wall hernia with mesenteric fat however there is no bowel the hernia sac measures 2.6 x 7.3 x 1.5 cm the neck is most likely 4-5 cm.  Assessment    This  is a 56 year old female with incarcerated incisional hernia she is a C-section in the past. High risk for her apparent patient secondary to previous smoking history which started 2012 as well as a previous CABG in 2003. I discussed the risks and benefits of the patient and her daughter to include wound infection wound complications, cardiac issues, as well as having to stay on the ventilator postoperatively.  H. Acknowledged the fact that the patient needs to have her hernia repaired. They understand that she is a high risk patient like to proceed with cardiac evaluation for possible fixation. It is possible that might seek  second opinion and decided on possible fixation at that time.    Plan    1. Patient undergo cardiac evaluation  2. The patient will followup in call should his next scheduled. Also has symptoms of emergent incarceration/strangulation again discussed with the  patient and have been asked to proceed to the ER should any symptoms occur.       Marigene Ehlers., Romain Erion 01/06/2012, 9:53 AM

## 2012-01-07 ENCOUNTER — Ambulatory Visit: Payer: Medicare Other | Admitting: Physician Assistant

## 2012-01-07 ENCOUNTER — Other Ambulatory Visit (HOSPITAL_COMMUNITY): Payer: Medicare Other

## 2012-01-07 ENCOUNTER — Encounter: Payer: Self-pay | Admitting: Cardiology

## 2012-01-07 ENCOUNTER — Ambulatory Visit (INDEPENDENT_AMBULATORY_CARE_PROVIDER_SITE_OTHER): Payer: Medicare Other | Admitting: Cardiology

## 2012-01-07 VITALS — BP 153/73 | HR 45 | Ht 67.0 in | Wt 328.4 lb

## 2012-01-07 DIAGNOSIS — I2510 Atherosclerotic heart disease of native coronary artery without angina pectoris: Secondary | ICD-10-CM

## 2012-01-07 DIAGNOSIS — I10 Essential (primary) hypertension: Secondary | ICD-10-CM

## 2012-01-07 DIAGNOSIS — E785 Hyperlipidemia, unspecified: Secondary | ICD-10-CM

## 2012-01-07 NOTE — Progress Notes (Signed)
HPI The patient presents for follow of CAD.  Since I last saw her she has done well.  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 She is sent for preoperative clearance. She reports that she's been doing well without any of her previous angina. Her highest level of activity is taking care of a 57-month-old grandson. She does not report any new cardiovascular symptoms such as chest pressure, neck or arm discomfort. She does not have any palpitations, presyncope or syncope. She's not having any PND or orthopnea. She's had no new swelling.  Allergies  Allergen Reactions  . Ace Inhibitors     Current Outpatient Prescriptions  Medication Sig Dispense Refill  . aspirin 81 MG tablet Take 81 mg by mouth daily.        . diclofenac (VOLTAREN) 75 MG EC tablet Take 75 mg by mouth 2 (two) times daily.        . fish oil-omega-3 fatty acids 1000 MG capsule Take 2 g by mouth daily.        . furosemide (LASIX) 20 MG tablet Take 10 mg by mouth daily.        . metoprolol (LOPRESSOR) 50 MG tablet Take 50 mg by mouth 2 (two) times daily.        . niacin (NIASPAN) 1000 MG CR tablet Take 1,000 mg by mouth at bedtime.        . rosuvastatin (CRESTOR) 10 MG tablet Take 10 mg by mouth daily.        . valsartan-hydrochlorothiazide (DIOVAN-HCT) 160-12.5 MG per tablet Take 1 tablet by mouth daily.          Past Medical History  Diagnosis Date  . Morbid obesity   . Other and unspecified hyperlipidemia   . Unspecified essential hypertension   . Acute myocardial infarction     with a ruptured plaque in the circumflex in 2003  . Degenerative joint disease   . Internal and external hemorrhoids without complication   . Anxiety     Past Surgical History  Procedure Date  . Cesarean section     x 4 in remote past  . Cholecystectomy   . Coronary artery bypass graft 2003    by Dr. Tyrone Sage. LIMA to the LAD, free RIMA to the  circumflex. Stress perfusion study December 2009 with no high-risk areas of ischemia. She has a well-preserved ejection fraction    ROS:  As stated in the HPI and negative for all other systems.  PHYSICAL EXAM BP 153/73  Pulse 45  Ht 5\' 7"  (1.702 m)  Wt 328 lb 6.4 oz (148.961 kg)  BMI 51.43 kg/m2 GENERAL:  Well appearing HEENT:  Pupils equal round and reactive, fundi not visualized, oral mucosa unremarkable NECK:  No jugular venous distention, waveform within normal limits, carotid upstroke brisk and symmetric, no bruits, no thyromegaly LYMPHATICS:  No cervical, inguinal adenopathy LUNGS:  Clear to auscultation bilaterally BACK:  No CVA tenderness CHEST:  Well healed sternotomy scar. HEART:  PMI not displaced or sustained,S1 and S2 within normal limits, no S3, no S4, no clicks, no rubs, no murmurs ABD:  Flat, positive bowel sounds normal in frequency in pitch, no bruits, no rebound, no guarding, no midline pulsatile mass, no hepatomegaly, no splenomegaly, obese EXT:  2 plus pulses throughout, no edema, no cyanosis no clubbing SKIN:  No rashes no nodules NEURO:  Cranial nerves II through XII grossly  intact, motor grossly intact throughout PSYCH:  Cognitively intact, oriented to person place and time  EKG:  Sinus rhythm, rate 61, left axis deviation RSR V1 and V2, no acute ST-T wave changes.  ASSESSMENT AND PLAN  PRE OP - The patient is going for a surgery date is moderate risk from a cardiovascular standpoint. She has a moderate functional level of the high. It has been continuous since her bypass graft and 4 years since the last stress test. She needs stress testing for clearance but would not be a walk on a treadmill so she will have a [employer].  CAD - This will be evaluated as above.  HYPERTENSION -  The blood pressure is at target. No change in medications is indicated. We will continue with therapeutic lifestyle changes (TLC).   DYSLIPIDEMIA -  This is followed by  Allie Dimmer with a goal LDL less than 100 and HDL greater than 40.

## 2012-01-07 NOTE — Patient Instructions (Addendum)
The current medical regimen is effective;  continue present plan and medications.  Your physician has requested that you have a lexiscan myoview. For further information please visit [email or web address]. Please follow instruction sheet, as given.  Follow up in 1 year with Dr Hochrein.  You will receive a letter in the mail 2 months before you are due.  Please call us when you receive this letter to schedule your follow up appointment.  

## 2012-01-10 ENCOUNTER — Telehealth: Payer: Self-pay | Admitting: Cardiology

## 2012-01-10 NOTE — Telephone Encounter (Signed)
Pt wants to have an echo before she has a stress test

## 2012-01-10 NOTE — Telephone Encounter (Signed)
Spoke with pt. Pt will keep appt for myoview scheduled for 01/17/12.

## 2012-01-14 ENCOUNTER — Other Ambulatory Visit: Payer: Self-pay | Admitting: Cardiology

## 2012-01-14 DIAGNOSIS — R079 Chest pain, unspecified: Secondary | ICD-10-CM

## 2012-01-17 ENCOUNTER — Encounter (HOSPITAL_COMMUNITY)
Admission: RE | Admit: 2012-01-17 | Discharge: 2012-01-17 | Disposition: A | Discharge status: Home / Self Care | Payer: Medicare Other | Source: Ambulatory Visit | Attending: Cardiology | Admitting: Cardiology

## 2012-01-17 ENCOUNTER — Ambulatory Visit (HOSPITAL_COMMUNITY)
Admission: RE | Admit: 2012-01-17 | Discharge: 2012-01-17 | Disposition: A | Discharge status: Home / Self Care | Payer: Medicare Other | Source: Ambulatory Visit | Attending: Cardiology | Admitting: Cardiology

## 2012-01-17 ENCOUNTER — Ambulatory Visit (HOSPITAL_COMMUNITY): Admission: RE | Admit: 2012-01-17 | Payer: Medicare Other | Source: Ambulatory Visit

## 2012-01-17 ENCOUNTER — Encounter (HOSPITAL_COMMUNITY): Payer: Self-pay | Admitting: Internal Medicine

## 2012-01-17 ENCOUNTER — Other Ambulatory Visit: Payer: Self-pay | Admitting: *Deleted

## 2012-01-17 DIAGNOSIS — I2510 Atherosclerotic heart disease of native coronary artery without angina pectoris: Secondary | ICD-10-CM

## 2012-01-17 DIAGNOSIS — R079 Chest pain, unspecified: Secondary | ICD-10-CM

## 2012-01-17 DIAGNOSIS — Z01810 Encounter for preprocedural cardiovascular examination: Secondary | ICD-10-CM | POA: Insufficient documentation

## 2012-01-17 MED ORDER — SODIUM CHLORIDE 0.9 % IJ SOLN
INTRAMUSCULAR | Status: AC
  Administered 2012-01-17: 10 mL via INTRAVENOUS
  Filled 2012-01-17: qty 10

## 2012-01-17 MED ORDER — REGADENOSON 0.4 MG/5ML IV SOLN
INTRAVENOUS | Status: AC
  Administered 2012-01-17: 0.4 mg via INTRAVENOUS
  Filled 2012-01-17: qty 5

## 2012-01-17 MED ORDER — TECHNETIUM TC 99M SESTAMIBI - CARDIOLITE
30.0000 | Freq: Once | INTRAVENOUS | Status: AC | PRN
  Administered 2012-01-17: 31 via INTRAVENOUS

## 2012-01-17 NOTE — Progress Notes (Signed)
Stress Lab Nurses Notes - [facility]  BRANDIN DILDAY 01/17/2012 Reason for doing test: CAD and Surgical Clearance Type of test: Marlane Hatcher Nurse performing test: Parke Poisson, RN Nuclear Medicine Tech: Lyndel Pleasure Echo Tech: Not Applicable MD performing test: Lovina Reach & Joni Reining NP Family MD :  [facility]  Test explained and consent signed: yes IV started: 22g jelco, Saline lock flushed and No redness or edema Symptoms: Dizziness Treatment/Intervention: None Reason test stopped: protocol completed After recovery IV was: Discontinued via X-ray tech and No redness or edema Patient to return to Nuc. Med at : 11:00 Patient discharged: Home Patient's Condition upon discharge was: stable Comments:  During test BP 148/61 & HR 67.  Recovery BP 136/55 & HR 55.  Symptoms resolved in recovery. Erskine Speed T

## 2012-01-18 ENCOUNTER — Encounter (HOSPITAL_COMMUNITY)
Admission: RE | Admit: 2012-01-18 | Discharge: 2012-01-18 | Disposition: A | Discharge status: Home / Self Care | Payer: Medicare Other | Source: Ambulatory Visit | Attending: Cardiology | Admitting: Cardiology

## 2012-01-18 ENCOUNTER — Ambulatory Visit (HOSPITAL_COMMUNITY)
Admission: RE | Admit: 2012-01-18 | Discharge: 2012-01-18 | Disposition: A | Discharge status: Home / Self Care | Payer: Medicare Other | Source: Ambulatory Visit | Attending: Cardiology | Admitting: Cardiology

## 2012-01-18 ENCOUNTER — Ambulatory Visit (HOSPITAL_COMMUNITY): Payer: Medicare Other

## 2012-01-18 ENCOUNTER — Encounter (HOSPITAL_COMMUNITY): Payer: Medicare Other

## 2012-01-18 DIAGNOSIS — I2510 Atherosclerotic heart disease of native coronary artery without angina pectoris: Secondary | ICD-10-CM | POA: Insufficient documentation

## 2012-01-18 DIAGNOSIS — Z01810 Encounter for preprocedural cardiovascular examination: Secondary | ICD-10-CM | POA: Insufficient documentation

## 2012-01-18 MED ORDER — TECHNETIUM TC 99M SESTAMIBI - CARDIOLITE
25.0000 | Freq: Once | INTRAVENOUS | Status: AC | PRN
  Administered 2012-01-18: 24 via INTRAVENOUS

## 2012-01-25 ENCOUNTER — Telehealth: Payer: Self-pay | Admitting: Cardiology

## 2012-01-25 NOTE — Telephone Encounter (Signed)
busy

## 2012-01-25 NOTE — Telephone Encounter (Signed)
Office note and nuclear report faxed to Dr. Derrell Lolling

## 2012-01-25 NOTE — Telephone Encounter (Signed)
Spoke with patient and she was to call back with [facility] number.  Surgery not scheduled yet awaiting clearance Dr Antoine Poche.

## 2012-01-25 NOTE — Telephone Encounter (Signed)
New problem:    Was told by Pam RN to leave message with triage nurse. Report Dr.Ramirez office  @# [phone] .  Fax # [phone]

## 2012-01-27 ENCOUNTER — Encounter (INDEPENDENT_AMBULATORY_CARE_PROVIDER_SITE_OTHER): Payer: Self-pay

## 2012-01-27 ENCOUNTER — Other Ambulatory Visit (INDEPENDENT_AMBULATORY_CARE_PROVIDER_SITE_OTHER): Payer: Self-pay | Admitting: General Surgery

## 2012-02-07 ENCOUNTER — Observation Stay (HOSPITAL_COMMUNITY): Payer: Medicare Other

## 2012-02-07 ENCOUNTER — Encounter (HOSPITAL_COMMUNITY): Payer: Self-pay | Admitting: Emergency Medicine

## 2012-02-07 ENCOUNTER — Encounter (HOSPITAL_COMMUNITY): Payer: Self-pay | Admitting: Certified Registered Nurse Anesthetist

## 2012-02-07 ENCOUNTER — Encounter (HOSPITAL_COMMUNITY): Admission: EM | Disposition: A | Discharge status: Home / Self Care | Payer: Self-pay | Source: Home / Self Care

## 2012-02-07 ENCOUNTER — Inpatient Hospital Stay (HOSPITAL_COMMUNITY)
Admission: EM | Admit: 2012-02-07 | Discharge: 2012-02-14 | DRG: 330 | Disposition: A | Discharge status: Home / Self Care | Payer: Medicare Other | Attending: General Surgery | Admitting: General Surgery

## 2012-02-07 ENCOUNTER — Observation Stay (HOSPITAL_COMMUNITY): Payer: Medicare Other | Admitting: Certified Registered Nurse Anesthetist

## 2012-02-07 DIAGNOSIS — Z6841 Body Mass Index (BMI) 40.0 and over, adult: Secondary | ICD-10-CM

## 2012-02-07 DIAGNOSIS — Z951 Presence of aortocoronary bypass graft: Secondary | ICD-10-CM

## 2012-02-07 DIAGNOSIS — K560 Paralytic ileus: Secondary | ICD-10-CM | POA: Diagnosis present

## 2012-02-07 DIAGNOSIS — K439 Ventral hernia without obstruction or gangrene: Secondary | ICD-10-CM

## 2012-02-07 DIAGNOSIS — K430 Incisional hernia with obstruction, without gangrene: Secondary | ICD-10-CM

## 2012-02-07 DIAGNOSIS — I2510 Atherosclerotic heart disease of native coronary artery without angina pectoris: Secondary | ICD-10-CM | POA: Diagnosis present

## 2012-02-07 DIAGNOSIS — Z87891 Personal history of nicotine dependence: Secondary | ICD-10-CM

## 2012-02-07 DIAGNOSIS — I252 Old myocardial infarction: Secondary | ICD-10-CM

## 2012-02-07 DIAGNOSIS — I10 Essential (primary) hypertension: Secondary | ICD-10-CM | POA: Diagnosis present

## 2012-02-07 DIAGNOSIS — E785 Hyperlipidemia, unspecified: Secondary | ICD-10-CM | POA: Diagnosis present

## 2012-02-07 DIAGNOSIS — D72829 Elevated white blood cell count, unspecified: Secondary | ICD-10-CM | POA: Diagnosis present

## 2012-02-07 HISTORY — PX: BOWEL RESECTION: SHX1257

## 2012-02-07 HISTORY — PX: INCISIONAL HERNIA REPAIR: SHX193

## 2012-02-07 LAB — BASIC METABOLIC PANEL
CO2: 29 mEq/L (ref 19–32)
Calcium: 9.1 mg/dL (ref 8.4–10.5)
Chloride: 97 mEq/L (ref 96–112)
GFR calc Af Amer: >90 mL/min (ref >90)
Sodium: 136 mEq/L (ref 135–145)

## 2012-02-07 LAB — CBC
HCT: 47.9 % — ABNORMAL HIGH (ref 36.0–46.0)
HCT: 45.1 % (ref 36.0–46.0)
Hemoglobin: 16.7 g/dL — ABNORMAL HIGH (ref 12.0–15.0)
MCH: 29.4 pg (ref 26.0–34.0)
MCV: 84.3 fL (ref 78.0–100.0)
MCV: 85.5 fL (ref 78.0–100.0)
Platelets: 242 10*3/uL (ref 150–400)
Platelets: 237 10*3/uL (ref 150–400)
RBC: 5.68 MIL/uL — ABNORMAL HIGH (ref 3.87–5.11)
RBC: 5.02 MIL/uL (ref 3.87–5.11)
RBC: 5.22 MIL/uL — ABNORMAL HIGH (ref 3.87–5.11)
RDW: 15.1 % (ref 11.5–15.5)
WBC: 14.4 10*3/uL — ABNORMAL HIGH (ref 4.0–10.5)
WBC: 18.2 10*3/uL — ABNORMAL HIGH (ref 4.0–10.5)

## 2012-02-07 LAB — POCT I-STAT, CHEM 8
Calcium, Ion: 1.11 mmol/L — ABNORMAL LOW (ref 1.12–1.23)
Chloride: 100 mEq/L (ref 96–112)
HCT: 52 % — ABNORMAL HIGH (ref 36.0–46.0)
Hemoglobin: 17.7 g/dL — ABNORMAL HIGH (ref 12.0–15.0)
Potassium: 3.6 mEq/L (ref 3.5–5.1)

## 2012-02-07 LAB — CREATININE, SERUM
GFR calc Af Amer: 83 mL/min — ABNORMAL LOW (ref >90)
GFR calc non Af Amer: 72 mL/min — ABNORMAL LOW (ref >90)

## 2012-02-07 SURGERY — REPAIR, HERNIA, INCISIONAL
Anesthesia: General | Site: Abdomen | Wound class: Contaminated

## 2012-02-07 MED ORDER — HYDROMORPHONE 0.3 MG/ML IV SOLN
INTRAVENOUS | Status: DC
  Administered 2012-02-07: 0.4 mg via INTRAVENOUS
  Administered 2012-02-07: 16:00:00 via INTRAVENOUS
  Administered 2012-02-08: 0.999 mg via INTRAVENOUS
  Administered 2012-02-08: 1.79 mg via INTRAVENOUS
  Administered 2012-02-08: 0.749 mg via INTRAVENOUS
  Administered 2012-02-08: 2.3 mg via INTRAVENOUS
  Administered 2012-02-08: 0.6 mg via INTRAVENOUS
  Administered 2012-02-08: 0.9 mg via INTRAVENOUS
  Administered 2012-02-08: 16:00:00 via INTRAVENOUS
  Administered 2012-02-09: 0.6 mg via INTRAVENOUS
  Administered 2012-02-09: 0.3 mg via INTRAVENOUS
  Administered 2012-02-09: 1.5 mg via INTRAVENOUS
  Administered 2012-02-09: 0.9 mg via INTRAVENOUS
  Administered 2012-02-10: 0.6 mg via INTRAVENOUS
  Administered 2012-02-10: 0.9 mg via INTRAVENOUS
  Administered 2012-02-10 (×3): 0.3 mg via INTRAVENOUS
  Administered 2012-02-10 – 2012-02-11 (×2): 0.9 mg via INTRAVENOUS
  Administered 2012-02-11 (×2): 0.6 mg via INTRAVENOUS
  Administered 2012-02-11: 0.9 mg via INTRAVENOUS
  Administered 2012-02-11: 1.08 mg via INTRAVENOUS
  Administered 2012-02-11: 0.6 mg via INTRAVENOUS
  Administered 2012-02-11: 12:00:00 via INTRAVENOUS
  Administered 2012-02-12 (×3): 0.6 mg via INTRAVENOUS
  Administered 2012-02-12: 0.3 mg via INTRAVENOUS
  Administered 2012-02-13: 0.9 mg via INTRAVENOUS
  Filled 2012-02-07 (×3): qty 25

## 2012-02-07 MED ORDER — VALSARTAN-HYDROCHLOROTHIAZIDE 160-12.5 MG PO TABS: 1.0000 | ORAL_TABLET | Freq: Every day | ORAL | Status: DC

## 2012-02-07 MED ORDER — IRBESARTAN 150 MG PO TABS
150.0000 mg | ORAL_TABLET | Freq: Every day | ORAL | Status: DC
  Administered 2012-02-07 – 2012-02-14 (×7): 150 mg via ORAL
  Filled 2012-02-07 (×8): qty 1

## 2012-02-07 MED ORDER — SUCCINYLCHOLINE CHLORIDE 20 MG/ML IJ SOLN
INTRAMUSCULAR | Status: DC | PRN
  Administered 2012-02-07: 120 mg via INTRAVENOUS

## 2012-02-07 MED ORDER — CHLORHEXIDINE GLUCONATE 4 % EX LIQD
1.0000 | Freq: Once | CUTANEOUS | Status: DC
Start: 2012-02-07 — End: 2012-02-07
  Filled 2012-02-07: qty 15

## 2012-02-07 MED ORDER — HYDROMORPHONE HCL PF 1 MG/ML IJ SOLN: 0.2500 mg | INTRAMUSCULAR | Status: DC | PRN

## 2012-02-07 MED ORDER — CHLORHEXIDINE GLUCONATE 4 % EX LIQD
1.0000 "application " | Freq: Once | CUTANEOUS | Status: DC
  Filled 2012-02-07: qty 15

## 2012-02-07 MED ORDER — 0.9 % SODIUM CHLORIDE (POUR BTL) OPTIME
TOPICAL | Status: DC | PRN
  Administered 2012-02-07 (×4): 1000 mL

## 2012-02-07 MED ORDER — DIPHENHYDRAMINE HCL 50 MG/ML IJ SOLN
12.5000 mg | Freq: Four times a day (QID) | INTRAMUSCULAR | Status: DC | PRN
  Administered 2012-02-08 – 2012-02-11 (×4): 12.5 mg via INTRAVENOUS
  Filled 2012-02-07 (×4): qty 1

## 2012-02-07 MED ORDER — ZOLPIDEM TARTRATE 5 MG PO TABS
5.0000 mg | ORAL_TABLET | Freq: Every evening | ORAL | Status: DC | PRN
  Administered 2012-02-08 – 2012-02-13 (×7): 5 mg via ORAL
  Filled 2012-02-07 (×7): qty 1

## 2012-02-07 MED ORDER — NEOSTIGMINE METHYLSULFATE 1 MG/ML IJ SOLN
INTRAMUSCULAR | Status: DC | PRN
  Administered 2012-02-07: 5 mg via INTRAVENOUS

## 2012-02-07 MED ORDER — DICLOFENAC SODIUM 75 MG PO TBEC
75.0000 mg | DELAYED_RELEASE_TABLET | Freq: Two times a day (BID) | ORAL | Status: DC
  Administered 2012-02-07: 75 mg via ORAL
  Filled 2012-02-07 (×2): qty 1

## 2012-02-07 MED ORDER — IOHEXOL 300 MG/ML  SOLN
100.0000 mL | Freq: Once | INTRAMUSCULAR | Status: AC | PRN
  Administered 2012-02-07: 100 mL via INTRAVENOUS

## 2012-02-07 MED ORDER — SODIUM CHLORIDE 0.9 % IJ SOLN: 9.0000 mL | INTRAMUSCULAR | Status: DC | PRN

## 2012-02-07 MED ORDER — OXYCODONE HCL 5 MG PO TABS: 5.0000 mg | ORAL_TABLET | Freq: Once | ORAL | Status: AC | PRN

## 2012-02-07 MED ORDER — DEXAMETHASONE SODIUM PHOSPHATE 4 MG/ML IJ SOLN
INTRAMUSCULAR | Status: DC | PRN
  Administered 2012-02-07: 4 mg via INTRAVENOUS

## 2012-02-07 MED ORDER — DEXTROSE 5 % IV SOLN
3.0000 g | INTRAVENOUS | Status: DC
  Filled 2012-02-07: qty 3000

## 2012-02-07 MED ORDER — LACTATED RINGERS IV SOLN
INTRAVENOUS | Status: DC | PRN
  Administered 2012-02-07 (×3): via INTRAVENOUS

## 2012-02-07 MED ORDER — HYDROMORPHONE 0.3 MG/ML IV SOLN
INTRAVENOUS | Status: AC
  Filled 2012-02-07: qty 25

## 2012-02-07 MED ORDER — MORPHINE SULFATE 4 MG/ML IJ SOLN
4.0000 mg | INTRAMUSCULAR | Status: DC | PRN
  Administered 2012-02-07 (×3): 4 mg via INTRAVENOUS
  Filled 2012-02-07 (×3): qty 1

## 2012-02-07 MED ORDER — VECURONIUM BROMIDE 10 MG IV SOLR
INTRAVENOUS | Status: DC | PRN
  Administered 2012-02-07: 2 mg via INTRAVENOUS
  Administered 2012-02-07 (×2): 1 mg via INTRAVENOUS

## 2012-02-07 MED ORDER — GLYCOPYRROLATE 0.2 MG/ML IJ SOLN
INTRAMUSCULAR | Status: DC | PRN
  Administered 2012-02-07: 0.6 mg via INTRAVENOUS
  Administered 2012-02-07: .8 mg via INTRAVENOUS

## 2012-02-07 MED ORDER — ONDANSETRON HCL 4 MG/2ML IJ SOLN
INTRAMUSCULAR | Status: DC | PRN
  Administered 2012-02-07: 4 mg via INTRAVENOUS

## 2012-02-07 MED ORDER — ONDANSETRON HCL 4 MG/2ML IJ SOLN
4.0000 mg | Freq: Once | INTRAMUSCULAR | Status: AC
  Administered 2012-02-07: 4 mg via INTRAVENOUS
  Filled 2012-02-07: qty 2

## 2012-02-07 MED ORDER — HEPARIN SODIUM (PORCINE) 5000 UNIT/ML IJ SOLN
5000.0000 [IU] | Freq: Three times a day (TID) | INTRAMUSCULAR | Status: DC
  Administered 2012-02-07 – 2012-02-14 (×19): 5000 [IU] via SUBCUTANEOUS
  Filled 2012-02-07 (×24): qty 1

## 2012-02-07 MED ORDER — ONDANSETRON HCL 4 MG/2ML IJ SOLN: 4.0000 mg | Freq: Four times a day (QID) | INTRAMUSCULAR | Status: DC | PRN

## 2012-02-07 MED ORDER — KCL IN DEXTROSE-NACL 20-5-0.9 MEQ/L-%-% IV SOLN
INTRAVENOUS | Status: DC
  Administered 2012-02-08 – 2012-02-13 (×12): via INTRAVENOUS
  Filled 2012-02-07 (×20): qty 1000

## 2012-02-07 MED ORDER — METOPROLOL TARTRATE 50 MG PO TABS
50.0000 mg | ORAL_TABLET | Freq: Two times a day (BID) | ORAL | Status: DC
  Administered 2012-02-07 – 2012-02-14 (×11): 50 mg via ORAL
  Filled 2012-02-07 (×17): qty 1

## 2012-02-07 MED ORDER — FUROSEMIDE 20 MG PO TABS
10.0000 mg | ORAL_TABLET | Freq: Every day | ORAL | Status: DC
  Administered 2012-02-07 – 2012-02-11 (×5): 10 mg via ORAL
  Administered 2012-02-12: 10:00:00 via ORAL
  Administered 2012-02-13 – 2012-02-14 (×2): 10 mg via ORAL
  Filled 2012-02-07 (×8): qty 0.5

## 2012-02-07 MED ORDER — PHENYLEPHRINE HCL 10 MG/ML IJ SOLN
INTRAMUSCULAR | Status: DC | PRN
  Administered 2012-02-07: 120 ug via INTRAVENOUS
  Administered 2012-02-07: 80 ug via INTRAVENOUS
  Administered 2012-02-07: 40 ug via INTRAVENOUS
  Administered 2012-02-07 (×3): 80 ug via INTRAVENOUS

## 2012-02-07 MED ORDER — PROPOFOL 10 MG/ML IV BOLUS
INTRAVENOUS | Status: DC | PRN
  Administered 2012-02-07: 180 mg via INTRAVENOUS

## 2012-02-07 MED ORDER — PIPERACILLIN-TAZOBACTAM 3.375 G IVPB
3.3750 g | Freq: Three times a day (TID) | INTRAVENOUS | Status: DC
  Administered 2012-02-07 – 2012-02-08 (×3): 3.375 g via INTRAVENOUS
  Filled 2012-02-07 (×6): qty 50

## 2012-02-07 MED ORDER — OXYCODONE HCL 5 MG/5ML PO SOLN: 5.0000 mg | Freq: Once | ORAL | Status: AC | PRN

## 2012-02-07 MED ORDER — ROCURONIUM BROMIDE 100 MG/10ML IV SOLN
INTRAVENOUS | Status: DC | PRN
  Administered 2012-02-07: 50 mg via INTRAVENOUS

## 2012-02-07 MED ORDER — LIDOCAINE HCL (CARDIAC) 20 MG/ML IV SOLN
INTRAVENOUS | Status: DC | PRN
  Administered 2012-02-07: 70 mg via INTRAVENOUS

## 2012-02-07 MED ORDER — ONDANSETRON HCL 4 MG/2ML IJ SOLN
4.0000 mg | Freq: Four times a day (QID) | INTRAMUSCULAR | Status: DC | PRN
  Administered 2012-02-07: 4 mg via INTRAVENOUS
  Filled 2012-02-07: qty 2

## 2012-02-07 MED ORDER — SODIUM CHLORIDE 0.9 % IV SOLN
Freq: Once | INTRAVENOUS | Status: AC
  Administered 2012-02-07: 01:00:00 via INTRAVENOUS

## 2012-02-07 MED ORDER — HYDROMORPHONE HCL PF 1 MG/ML IJ SOLN
1.0000 mg | Freq: Once | INTRAMUSCULAR | Status: AC
  Administered 2012-02-07: 1 mg via INTRAVENOUS
  Filled 2012-02-07: qty 1

## 2012-02-07 MED ORDER — ACETAMINOPHEN 10 MG/ML IV SOLN
INTRAVENOUS | Status: DC | PRN
  Administered 2012-02-07: 1000 mg via INTRAVENOUS

## 2012-02-07 MED ORDER — PANTOPRAZOLE SODIUM 40 MG IV SOLR
40.0000 mg | Freq: Every day | INTRAVENOUS | Status: DC
  Administered 2012-02-07 – 2012-02-13 (×7): 40 mg via INTRAVENOUS
  Filled 2012-02-07 (×10): qty 40

## 2012-02-07 MED ORDER — LACTATED RINGERS IV SOLN
INTRAVENOUS | Status: DC
  Administered 2012-02-07: 11:00:00 via INTRAVENOUS

## 2012-02-07 MED ORDER — HYDROCHLOROTHIAZIDE 12.5 MG PO CAPS
12.5000 mg | ORAL_CAPSULE | Freq: Every day | ORAL | Status: DC
  Administered 2012-02-07 – 2012-02-14 (×7): 12.5 mg via ORAL
  Filled 2012-02-07 (×8): qty 1

## 2012-02-07 MED ORDER — KCL IN DEXTROSE-NACL 20-5-0.9 MEQ/L-%-% IV SOLN
INTRAVENOUS | Status: DC
  Administered 2012-02-07: 06:00:00 via INTRAVENOUS
  Filled 2012-02-07 (×3): qty 1000

## 2012-02-07 MED ORDER — ATORVASTATIN CALCIUM 10 MG PO TABS
10.0000 mg | ORAL_TABLET | Freq: Every day | ORAL | Status: DC
  Administered 2012-02-08 – 2012-02-11 (×5): 10 mg via ORAL
  Filled 2012-02-07 (×7): qty 1

## 2012-02-07 MED ORDER — FENTANYL CITRATE 0.05 MG/ML IJ SOLN
INTRAMUSCULAR | Status: DC | PRN
  Administered 2012-02-07: 50 ug via INTRAVENOUS
  Administered 2012-02-07: 100 ug via INTRAVENOUS
  Administered 2012-02-07: 50 ug via INTRAVENOUS
  Administered 2012-02-07: 100 ug via INTRAVENOUS

## 2012-02-07 MED ORDER — NALOXONE HCL 0.4 MG/ML IJ SOLN: 0.4000 mg | INTRAMUSCULAR | Status: DC | PRN

## 2012-02-07 MED ORDER — DIPHENHYDRAMINE HCL 12.5 MG/5ML PO ELIX
12.5000 mg | ORAL_SOLUTION | Freq: Four times a day (QID) | ORAL | Status: DC | PRN
  Filled 2012-02-07: qty 5

## 2012-02-07 SURGICAL SUPPLY — 67 items
BINDER ABD UNIV 12 45-62 (WOUND CARE) ×2 IMPLANT
BINDER ABDOMINAL 46IN 62IN (WOUND CARE) ×3
BLADE SURG ROTATE 9660 (MISCELLANEOUS) IMPLANT
CANISTER SUCTION 2500CC (MISCELLANEOUS) ×3 IMPLANT
CHLORAPREP W/TINT 26ML (MISCELLANEOUS) ×6 IMPLANT
CLOTH BEACON ORANGE TIMEOUT ST (SAFETY) ×3 IMPLANT
CONT SPEC 4OZ CLIKSEAL STRL BL (MISCELLANEOUS) ×3 IMPLANT
COVER SURGICAL LIGHT HANDLE (MISCELLANEOUS) ×3 IMPLANT
DERMABOND ADVANCED (GAUZE/BANDAGES/DRESSINGS)
DERMABOND ADVANCED .7 DNX12 (GAUZE/BANDAGES/DRESSINGS) IMPLANT
DRAIN CHANNEL 19F RND (DRAIN) ×6 IMPLANT
DRAPE LAPAROSCOPIC ABDOMINAL (DRAPES) ×3 IMPLANT
DRAPE UTILITY 15X26 W/TAPE STR (DRAPE) ×6 IMPLANT
DRAPE WARM FLUID 44X44 (DRAPE) ×3 IMPLANT
DRSG PAD ABDOMINAL 8X10 ST (GAUZE/BANDAGES/DRESSINGS) ×6 IMPLANT
ELECT CAUTERY BLADE 6.4 (BLADE) ×6 IMPLANT
ELECT REM PT RETURN 9FT ADLT (ELECTROSURGICAL) ×3
ELECTRODE REM PT RTRN 9FT ADLT (ELECTROSURGICAL) ×2 IMPLANT
EVACUATOR SILICONE 100CC (DRAIN) ×6 IMPLANT
GLOVE BIO SURGEON STRL SZ 6.5 (GLOVE) ×3 IMPLANT
GLOVE BIO SURGEON STRL SZ7 (GLOVE) ×3 IMPLANT
GLOVE BIOGEL M STRL SZ7.5 (GLOVE) ×6 IMPLANT
GLOVE BIOGEL PI IND STRL 6.5 (GLOVE) ×4 IMPLANT
GLOVE BIOGEL PI IND STRL 7.0 (GLOVE) ×4 IMPLANT
GLOVE BIOGEL PI IND STRL 8 (GLOVE) ×4 IMPLANT
GLOVE BIOGEL PI INDICATOR 6.5 (GLOVE) ×2
GLOVE BIOGEL PI INDICATOR 7.0 (GLOVE) ×2
GLOVE BIOGEL PI INDICATOR 8 (GLOVE) ×2
GLOVE ECLIPSE 7.0 STRL STRAW (GLOVE) ×3 IMPLANT
GLOVE ECLIPSE 7.5 STRL STRAW (GLOVE) ×3 IMPLANT
GLOVE SS BIOGEL STRL SZ 6.5 (GLOVE) ×2 IMPLANT
GLOVE SUPERSENSE BIOGEL SZ 6.5 (GLOVE) ×1
GLOVE SURG SS PI 6.5 STRL IVOR (GLOVE) ×6 IMPLANT
GOWN PREVENTION PLUS XXLARGE (GOWN DISPOSABLE) ×3 IMPLANT
GOWN STRL NON-REIN LRG LVL3 (GOWN DISPOSABLE) ×15 IMPLANT
KIT BASIN OR (CUSTOM PROCEDURE TRAY) ×3 IMPLANT
KIT ROOM TURNOVER OR (KITS) ×3 IMPLANT
LIGASURE IMPACT 36 18CM CVD LR (INSTRUMENTS) ×3 IMPLANT
NEEDLE HYPO 25GX1X1/2 BEV (NEEDLE) IMPLANT
NS IRRIG 1000ML POUR BTL (IV SOLUTION) ×6 IMPLANT
PACK GENERAL/GYN (CUSTOM PROCEDURE TRAY) ×3 IMPLANT
PAD ARMBOARD 7.5X6 YLW CONV (MISCELLANEOUS) ×3 IMPLANT
PAD SHARPS MAGNETIC DISPOSAL (MISCELLANEOUS) ×3 IMPLANT
PEN SKIN MARKING BROAD (MISCELLANEOUS) ×3 IMPLANT
RELOAD LINEAR CUT PROX 55 BLUE (ENDOMECHANICALS) ×6 IMPLANT
SEPRAFILM PROCEDURAL PACK 3X5 (MISCELLANEOUS) ×3 IMPLANT
SPONGE GAUZE 4X4 12PLY (GAUZE/BANDAGES/DRESSINGS) ×6 IMPLANT
STAPLER GUN LINEAR PROX 60 (STAPLE) ×3 IMPLANT
STAPLER PROXIMATE 55 BLUE (STAPLE) ×3 IMPLANT
STAPLER VISISTAT (STAPLE) ×6 IMPLANT
STAPLER VISISTAT 35W (STAPLE) IMPLANT
SUCTION POOLE TIP (SUCTIONS) ×3 IMPLANT
SUT ETHILON 2 0 FS 18 (SUTURE) ×6 IMPLANT
SUT MNCRL AB 4-0 PS2 18 (SUTURE) IMPLANT
SUT NOVA 1 T20/GS 25DT (SUTURE) ×6 IMPLANT
SUT PDS AB 1 TP1 96 (SUTURE) ×6 IMPLANT
SUT PROLENE 0 CT 2 (SUTURE) IMPLANT
SUT SILK 2 0 SH CR/8 (SUTURE) ×3 IMPLANT
SUT SILK 3 0 SH CR/8 (SUTURE) ×3 IMPLANT
SUT VIC AB 3-0 SH 27 (SUTURE)
SUT VIC AB 3-0 SH 27X BRD (SUTURE) IMPLANT
SYR CONTROL 10ML LL (SYRINGE) IMPLANT
TOWEL OR 17X24 6PK STRL BLUE (TOWEL DISPOSABLE) ×3 IMPLANT
TOWEL OR 17X26 10 PK STRL BLUE (TOWEL DISPOSABLE) ×3 IMPLANT
TRAY FOLEY CATH 14FRSI W/METER (CATHETERS) ×3 IMPLANT
WATER STERILE IRR 1000ML POUR (IV SOLUTION) IMPLANT
YANKAUER SUCT BULB TIP NO VENT (SUCTIONS) ×3 IMPLANT

## 2012-02-07 NOTE — Transfer of Care (Signed)
Immediate Anesthesia Transfer of Care Note  Patient: Julia West  Procedure(s) Performed: Procedure(s) (LRB) with comments: HERNIA REPAIR INCISIONAL () - Open, Primary repair, strangulated Incisional hernia. SMALL BOWEL RESECTION ()  Patient Location: PACU  Anesthesia Type:General  Level of Consciousness: awake, alert , oriented and patient cooperative  Airway & Oxygen Therapy: Patient Spontanous Breathing and Connected to CPAP oxygen for decreased O2sat  Post-op Assessment: Report given to PACU RN, Post -op Vital signs reviewed and stable and Patient moving all extremities  Post vital signs: Reviewed and stable  Complications: No apparent anesthesia complications

## 2012-02-07 NOTE — Progress Notes (Signed)
Subjective: Still having some pain. No flatus.   Objective: Vital signs in last 24 hours: Temp:  [98.4 F (36.9 C)] 98.4 F (36.9 C) (11/25 0432) Pulse Rate:  [63-88] 87  (11/25 0432) Resp:  [17-21] 21  (11/25 0432) BP: (146-181)/(54-94) 164/70 mmHg (11/25 0432) SpO2:  [88 %-95 %] 88 % (11/25 0432) Weight:  [328 lb 4.2 oz (148.9 kg)] 328 lb 4.2 oz (148.9 kg) (11/25 0432) Last BM Date: 02/06/12  Intake/Output from previous day: 11/24 0701 - 11/25 0700 In: 133.3 [I.V.:133.3] Out: -  Intake/Output this shift:    Alert,  Morbidly obese, resting comfortably cta ant Reg Obese, umbilical hernia -soft, bulge to right of lower midline incision, mild TTP. No cellulitis. No RT  Lab Results:   Basename 02/07/12 0805 02/07/12 0126 02/07/12 0104  WBC 14.4* -- 18.0*  HGB 14.4 17.7* --  HCT 42.9 52.0* --  PLT 242 -- 233   BMET  Basename 02/07/12 0126  NA 136  K 3.6  CL 100  CO2 --  GLUCOSE 162*  BUN 14  CREATININE 1.00  CALCIUM --   PT/INR No results found for this basename: LABPROT:2,INR:2 in the last 72 hours ABG No results found for this basename: PHART:2,PCO2:2,PO2:2,HCO3:2 in the last 72 hours  Studies/Results: Ct Abdomen Pelvis W Contrast  02/07/2012  *RADIOLOGY REPORT*  Clinical Data: Incarcerated abdominal hernia at the right lower quadrant.  Worsening pain and nausea.  CT ABDOMEN AND PELVIS WITH CONTRAST  Technique:  Multidetector CT imaging of the abdomen and pelvis was performed following the standard protocol during bolus administration of intravenous contrast.  Contrast: OMNIPAQUE IOHEXOL 300 MG/ML  SOLN  Comparison: MRI of the abdomen and pelvis performed 01/04/2012  Findings: Mild bibasilar atelectasis is noted.  The patient is status post median sternotomy.  The liver and spleen are unremarkable in appearance.  The gallbladder is within normal limits.  The pancreas and adrenal glands are unremarkable; slight apparent stranding about the tail of the  pancreas likely reflects normal vasculature.  A 5.8 cm cyst is noted at the upper pole of the right kidney.  The kidneys are otherwise unremarkable in appearance.  There is no evidence of hydronephrosis.  No renal or ureteral stones are seen. No perinephric stranding is appreciated.  The stomach is within normal limits.  No acute vascular abnormalities are seen.  Diffuse calcification is noted along the abdominal aorta and its branches.  The appendix is not definitely seen; there is no evidence for appendicitis.  The colon is largely decompressed.  Diffuse diverticulosis is noted along the proximal sigmoid colon; the colon is otherwise unremarkable in appearance.  A moderate-to-large anterior abdominal wall hernia is noted at the right lower quadrant, with a short segment of herniated small bowel.  A small amount of associated fluid is seen, and soft tissue stranding is noted about the herniated small bowel segment.  A small broad-based hernia is noted at the level of the umbilicus.  There is no evidence for bowel obstruction.  The bladder is mildly distended and grossly unremarkable in appearance.  The uterus is within normal limits.  The ovaries are relatively symmetric; no suspicious adnexal masses are seen.  No inguinal lymphadenopathy is seen.  No acute osseous abnormalities are identified.  IMPRESSION:  1.  Moderate to large anterior abdominal wall hernia at the right lower quadrant, with a short segment of herniated small bowel. Associated soft tissue inflammation and small amount of associated fluid; no evidence for bowel obstruction. 2.  Smaller broad-based hernia at the level of the umbilicus, containing only fat. 3.  5.8 cm right renal cyst noted. 4.  Mild bibasilar atelectasis seen. 5.  Diffuse calcification along the abdominal aorta and its branches. 6.  Diffuse diverticulosis along the proximal sigmoid colon, without evidence of diverticulitis.   Original Report Authenticated By: Tonia Ghent, M.D.       Anti-infectives: Anti-infectives    None      Assessment/Plan: Incarcerated incisional hernia - explained to pt and family that now she has intestine stuck in hernia and surgery is needed to prevent strangulation (which already may be occurring) given leukcytosis  We discussed the risk and benefits of surgery including but not limited to bleeding, infection, injury to surrounding structures, hernia recurrence,  hematoma/seroma formation, blood clot formation, urinary retention, post operative ileus, general anesthesia risk, long-term abdominal pain. I explained in this setting with incarcerated intestine with possible strangulation we probably will not be able to use mesh to reinforce the hernia repair because of the risk of mesh infection. Therefore, she is at high risk of hernia recurrence given this along with her morbid obesity. We discussed the possibility of intestinal resection with possible ostomy, including anastomotic leak/stricture. We also discussed the possibility of wound vac placement. I explained that I spoke with Dr Harden Mo who concurred with the plan. We discussed that this procedure can be quite uncomfortable and difficult to recover from. We discussed the importance of avoiding heavy lifting and straining for a period of 6 weeks.  CAD s/p CABG - recent cardiac perfusion study - ok for hernia surgery per cardiologist note  All of the pts and families questions were asked and answered  [name]. [name], MD, FACS General, Bariatric, & Minimally Invasive Surgery [facility], [place]    LOS: 0 days    [name] 02/07/2012

## 2012-02-07 NOTE — H&P (Signed)
Julia West is an 56 y.o. female.   Chief Complaint: abd pain HPI: 56 yo wf morbidly obese who saw Dr. Derrell Lolling recently for lower abd pain. An MRI showed her to have an incarcerated hernia below umbilicus containing only omental fat. She was lifting some groceries tonight and her pain worsened and the bulge seemed to get bigger. No sign of obstruction.  Past Medical History  Diagnosis Date  . Morbid obesity   . Other and unspecified hyperlipidemia   . Unspecified essential hypertension   . Acute myocardial infarction     with a ruptured plaque in the circumflex in 2003  . Degenerative joint disease   . Internal and external hemorrhoids without complication   . Anxiety   . Coronary artery disease     Past Surgical History  Procedure Date  . Cesarean section     x 4 in remote past  . Cholecystectomy   . Coronary artery bypass graft 2003    by Dr. Tyrone Sage. LIMA to the LAD, free RIMA to the circumflex. Stress perfusion study December 2009 with no high-risk areas of ischemia. She has a well-preserved ejection fraction    Family History  Problem Relation Age of Onset  . Heart attack Mother   . Hypertension Sister    Social History:  reports that she quit smoking about 2 months ago. Her smoking use included Cigarettes. She has a 25 pack-year smoking history. She does not have any smokeless tobacco history on file. She reports that she does not drink alcohol or use illicit drugs.  Allergies:  Allergies  Allergen Reactions  . Ace Inhibitors Cough     (Not in a hospital admission)  Results for orders placed during the hospital encounter of 02/07/12 (from the past 48 hour(s))  CBC     Status: Abnormal   Collection Time   02/07/12  1:04 AM      Component Value Range Comment   WBC 18.0 (*) 4.0 - 10.5 K/uL    RBC 5.68 (*) 3.87 - 5.11 MIL/uL    Hemoglobin 16.7 (*) 12.0 - 15.0 g/dL    HCT 11.9 (*) 14.7 - 46.0 %    MCV 84.3  78.0 - 100.0 fL    MCH 29.4  26.0 - 34.0 pg    MCHC 34.9  30.0 - 36.0 g/dL    RDW 82.9  56.2 - 13.0 %    Platelets 233  150 - 400 K/uL   POCT I-STAT, CHEM 8     Status: Abnormal   Collection Time   02/07/12  1:26 AM      Component Value Range Comment   Sodium 136  135 - 145 mEq/L    Potassium 3.6  3.5 - 5.1 mEq/L    Chloride 100  96 - 112 mEq/L    BUN 14  6 - 23 mg/dL    Creatinine, Ser 8.65  0.50 - 1.10 mg/dL    Glucose, Bld 784 (*) 70 - 99 mg/dL    Calcium, Ion 6.96 (*) 1.12 - 1.23 mmol/L    TCO2 28  0 - 100 mmol/L    Hemoglobin 17.7 (*) 12.0 - 15.0 g/dL    HCT 29.5 (*) 28.4 - 46.0 %    No results found.  Review of Systems  Constitutional: Negative.   HENT: Negative.   Eyes: Negative.   Respiratory: Negative.   Cardiovascular: Negative.   Gastrointestinal: Positive for abdominal pain. Negative for vomiting.  Genitourinary: Negative.   Musculoskeletal: Negative.  Skin: Negative.   Neurological: Negative.   Endo/Heme/Allergies: Negative.   Psychiatric/Behavioral: Negative.     Blood pressure 148/54, pulse 72, resp. rate 20, SpO2 95.00%. Physical Exam  Constitutional: She is oriented to person, place, and time.       Morbidly obese  HENT:  Head: Normocephalic and atraumatic.  Eyes: Conjunctivae normal and EOM are normal. Pupils are equal, round, and reactive to light.  Neck: Normal range of motion. Neck supple.  Cardiovascular: Normal rate, regular rhythm and normal heart sounds.   Respiratory: Effort normal and breath sounds normal.  GI: Soft. Bowel sounds are normal.       Incarcerated hernia infraumbilical. No distension. No cellulitis  Musculoskeletal: Normal range of motion.  Neurological: She is alert and oriented to person, place, and time.  Skin: Skin is warm and dry.  Psychiatric: She has a normal mood and affect. Her behavior is normal.     Assessment/Plan Pt has a large incarcerated hernia most likely containing only omental fat. Will get a CT to confirm this. Will admit for pain control and try to  get her to her surgery date unless she has bowel in the hernia.  TOTH III,PAUL S 02/07/2012, 2:23 AM

## 2012-02-07 NOTE — ED Notes (Signed)
Pt continues to desat  To 87% at 3L via [place]

## 2012-02-07 NOTE — Progress Notes (Signed)
56yo female admitted with incarcerated hernia below umbilicus with possible strangulation to begin IV ABX while awaiting confirmation and plan.  Will begin Zosyn 3.375g IV Q8H for CrCl ~90 ml/min.  Vernard Gambles, PharmD, BCPS 02/07/2012 7:53 AM

## 2012-02-07 NOTE — ED Notes (Signed)
Family at bedside. 

## 2012-02-07 NOTE — ED Notes (Signed)
Pt o2 bumped up to humidified 5L via [place]

## 2012-02-07 NOTE — Brief Op Note (Signed)
02/07/2012  1:34 PM  PATIENT:  Julia West  56 y.o. female  PRE-OPERATIVE DIAGNOSIS:  Incarcerated incisional VENTRAL HERNIA  POST-OPERATIVE DIAGNOSIS:  Strangulated incisional VENTRAL HERNIA  PROCEDURE:  Procedure(s) (LRB) with comments: HERNIA REPAIR INCISIONAL () - Open, Primary repair, strangulated Incisional hernia. SMALL BOWEL RESECTION ()  SURGEON:  Surgeon(s) and Role:    * [name], MD,FACS - Primary  PHYSICIAN ASSISTANT: Earl Gala, PA-C  ASSISTANTS: none   ANESTHESIA:   general  EBL:  Total I/O In: 2000 [I.V.:2000] Out: 745 [Urine:45; Other:500; Blood:200]  BLOOD ADMINISTERED:none  DRAINS: (90fr) Jackson-Pratt drain(s) with closed bulb suction in the subcu space x 2   LOCAL MEDICATIONS USED:  NONE  SPECIMEN:  Source of Specimen:  small bowel, omentum  DISPOSITION OF SPECIMEN:  PATHOLOGY  COUNTS:  YES  TOURNIQUET:  * No tourniquets in log *  DICTATION: .Other Dictation: Dictation Number [id number]  PLAN OF CARE: return to med-surg floor after PACU  PATIENT DISPOSITION:  PACU - hemodynamically stable.   Delay start of Pharmacological VTE agent (>24hrs) due to surgical blood loss or risk of bleeding: no

## 2012-02-07 NOTE — ED Notes (Signed)
Pt reports incarcerated hernia to R lower abd-has surgery scheduled for Dec 9th; pt reports increased pain around 10 pm, having "reflux symptoms", pt reports nausea, and hernia firm to palpation

## 2012-02-07 NOTE — Anesthesia Preprocedure Evaluation (Signed)
Anesthesia Evaluation  Patient identified by MRN, date of birth, ID band Patient awake    Reviewed: Allergy & Precautions, H&P , NPO status , Patient's Chart, lab work & pertinent test results  Airway Mallampati: III  Neck ROM: full    Dental   Pulmonary          Cardiovascular hypertension, + CAD, + Past MI and + CABG     Neuro/Psych Anxiety    GI/Hepatic   Endo/Other  Morbid obesity  Renal/GU      Musculoskeletal   Abdominal   Peds  Hematology   Anesthesia Other Findings   Reproductive/Obstetrics                           Anesthesia Physical Anesthesia Plan  ASA: III  Anesthesia Plan: General   Post-op Pain Management:    Induction: Intravenous  Airway Management Planned: Oral ETT  Additional Equipment:   Intra-op Plan:   Post-operative Plan: Extubation in OR  Informed Consent: I have reviewed the patients History and Physical, chart, labs and discussed the procedure including the risks, benefits and alternatives for the proposed anesthesia with the patient or authorized representative who has indicated his/her understanding and acceptance.     Plan Discussed with: CRNA and Surgeon  Anesthesia Plan Comments:         Anesthesia Quick Evaluation

## 2012-02-07 NOTE — ED Notes (Signed)
MD at bedside. 

## 2012-02-07 NOTE — ED Provider Notes (Signed)
History     CSN: [id number]  Arrival date & time 02/07/12  0047   First MD Initiated Contact with Patient 02/07/12 0052      Chief Complaint  Patient presents with  . Hernia    (Consider location/radiation/quality/duration/timing/severity/associated sxs/prior treatment) HPI History provided by patient and family bedside. Chief complaint is "incarcerated hernia" patient was recently diagnosed with abdominal wall hernia by MRI by her primary care physician. She has had previous C-sections. She followed up with general surgeon Dr. Derrell Lolling. She has been cleared by cardiology for surgery. She is scheduled for surgery 02/21/2012. Tonight at home her hernia more painful and got larger so she presents here for evaluation. Some nausea but no vomiting. Normal bowel movement yesterday. No blood in stools or constipation. She called her physician and was told to come the emergency department. Moderate in severity. No known alleviating factors. Pain does not radiate from lower mid abdomen. Sharp in quality. Past Medical History  Diagnosis Date  . Morbid obesity   . Other and unspecified hyperlipidemia   . Unspecified essential hypertension   . Acute myocardial infarction     with a ruptured plaque in the circumflex in 2003  . Degenerative joint disease   . Internal and external hemorrhoids without complication   . Anxiety   . Coronary artery disease     Past Surgical History  Procedure Date  . Cesarean section     x 4 in remote past  . Cholecystectomy   . Coronary artery bypass graft 2003    by Dr. Tyrone Sage. LIMA to the LAD, free RIMA to the circumflex. Stress perfusion study December 2009 with no high-risk areas of ischemia. She has a well-preserved ejection fraction    Family History  Problem Relation Age of Onset  . Heart attack Mother   . Hypertension Sister     History  Substance Use Topics  . Smoking status: Former Smoker -- 1.0 packs/day for 25 years    Types: Cigarettes    Quit date: 11/17/2011  . Smokeless tobacco: Not on file  . Alcohol Use: No    OB History    Grav Para Term Preterm Abortions TAB SAB Ect Mult Living                  Review of Systems  Constitutional: Negative for fever and chills.  HENT: Negative for neck pain and neck stiffness.   Eyes: Negative for pain.  Respiratory: Negative for shortness of breath.   Cardiovascular: Negative for chest pain.  Gastrointestinal: Positive for abdominal pain. Negative for vomiting.  Genitourinary: Negative for dysuria.  Musculoskeletal: Negative for back pain.  Skin: Negative for rash.  Neurological: Negative for headaches.  All other systems reviewed and are negative.    Allergies  Ace inhibitors  Home Medications   Current Outpatient Rx  Name  Route  Sig  Dispense  Refill  . ASPIRIN 81 MG PO TABS   Oral   Take 81 mg by mouth daily.           Marland Kitchen DICLOFENAC SODIUM 75 MG PO TBEC   Oral   Take 75 mg by mouth 2 (two) times daily.           . OMEGA-3 FATTY ACIDS 1000 MG PO CAPS   Oral   Take 2 g by mouth daily.           . FUROSEMIDE 20 MG PO TABS   Oral   Take 10 mg by mouth  daily.           Marland Kitchen METOPROLOL TARTRATE 50 MG PO TABS   Oral   Take 50 mg by mouth 2 (two) times daily.           Marland Kitchen ROSUVASTATIN CALCIUM 10 MG PO TABS   Oral   Take 10 mg by mouth daily.           Marland Kitchen VALSARTAN-HYDROCHLOROTHIAZIDE 160-12.5 MG PO TABS   Oral   Take 1 tablet by mouth daily.           Marland Kitchen ZOLPIDEM TARTRATE 10 MG PO TABS   Oral   Take 10 mg by mouth at bedtime as needed. For sleep           BP 181/94  Pulse 88  Resp 20  SpO2 94%  Physical Exam  Constitutional: She is oriented to person, place, and time. She appears well-developed and well-nourished.  HENT:  Head: Normocephalic and atraumatic.  Eyes: EOM are normal. Pupils are equal, round, and reactive to light.  Neck: Neck supple.  Cardiovascular: Normal rate, regular rhythm and intact distal pulses.     Pulmonary/Chest: Effort normal and breath sounds normal. No respiratory distress.  Abdominal: Soft.       Mildly tender mid lower abdominal hernia which is firm and not reducible. No bowel sounds within this mass, has dec bowel sounds otherwise. No peritonitis.  Musculoskeletal: Normal range of motion. She exhibits no edema.  Neurological: She is alert and oriented to person, place, and time.  Skin: Skin is warm and dry.    ED Course  Procedures (including critical care time)  Results for orders placed during the hospital encounter of 02/07/12  CBC      Component Value Range   WBC 18.0 (*) 4.0 - 10.5 K/uL   RBC 5.68 (*) 3.87 - 5.11 MIL/uL   Hemoglobin 16.7 (*) 12.0 - 15.0 g/dL   HCT 11.9 (*) 41.7 - 40.8 %   MCV 84.3  78.0 - 100.0 fL   MCH 29.4  26.0 - 34.0 pg   MCHC 34.9  30.0 - 36.0 g/dL   RDW 14.4  81.8 - 56.3 %   Platelets 233  150 - 400 K/uL  POCT I-STAT, CHEM 8      Component Value Range   Sodium 136  135 - 145 mEq/L   Potassium 3.6  3.5 - 5.1 mEq/L   Chloride 100  96 - 112 mEq/L   BUN 14  6 - 23 mg/dL   Creatinine, Ser 1.49  0.50 - 1.10 mg/dL   Glucose, Bld 702 (*) 70 - 99 mg/dL   Calcium, Ion 6.37 (*) 1.12 - 1.23 mmol/L   TCO2 28  0 - 100 mmol/L   Hemoglobin 17.7 (*) 12.0 - 15.0 g/dL   HCT 85.8 (*) 85.0 - 27.7 %     Initially I am unable to reduce this hernia. Given IV narcotics and kept in Trendelenburg position and on repeat attempt and still able to reduce hernia. General surgery consult obtained.   2:02 AM case discussed with Dr. Carolynne Edouard, GSU. He recommends getting a CT scan at this time to evaluate for any bowel/ obstruction. Will admit  MDM   Abdominal wall hernia. Old records reviewed including recent MRI of abdomen demonstrating this hernia. Labs. General surgery consult. CT scan and surgery admission. IV narcotics pain control.        Sunnie Nielsen, MD 02/07/12 [phone]

## 2012-02-07 NOTE — ED Notes (Signed)
Pt o2 sats rise to 98% when aroused at 5L via [place], but at rest stay at 92% 5L via [place]

## 2012-02-07 NOTE — Anesthesia Postprocedure Evaluation (Signed)
  Anesthesia Post-op Note  Patient: Julia West  Procedure(s) Performed: Procedure(s) (LRB) with comments: HERNIA REPAIR INCISIONAL () - Open, Primary repair, strangulated Incisional hernia. SMALL BOWEL RESECTION ()  Patient Location: PACU  Anesthesia Type:General  Level of Consciousness: awake  Airway and Oxygen Therapy: Patient Spontanous Breathing  Post-op Pain: mild  Post-op Assessment: Post-op Vital signs reviewed, Patient's Cardiovascular Status Stable, Respiratory Function Stable, Patent Airway, No signs of Nausea or vomiting and Pain level controlled  Post-op Vital Signs: stable  Complications: No apparent anesthesia complications

## 2012-02-07 NOTE — ED Notes (Signed)
Pt o2 sat dropped to 88%, started on 2L bumped up to 3L via [place].

## 2012-02-07 NOTE — ED Notes (Signed)
Pt not placed in Trendelenburg position at this time due to pt oxygen saturation.

## 2012-02-07 NOTE — Progress Notes (Signed)
Patient with an oxygenation of 90 on 4 l [place]. Call to dr. [name] and reported patient using cpap on arrival to pacu and oxygenation issues. He would like her to go to [address].  Room request changed per his request.

## 2012-02-08 ENCOUNTER — Encounter (HOSPITAL_COMMUNITY): Payer: Self-pay | Admitting: General Surgery

## 2012-02-08 LAB — BASIC METABOLIC PANEL
Calcium: 8.8 mg/dL (ref 8.4–10.5)
Chloride: 102 mEq/L (ref 96–112)
Creatinine, Ser: 0.92 mg/dL (ref 0.50–1.10)
GFR calc Af Amer: 78 mL/min — ABNORMAL LOW (ref >90)

## 2012-02-08 LAB — CBC
Platelets: 239 10*3/uL (ref 150–400)
RDW: 15.5 % (ref 11.5–15.5)
WBC: 16.4 10*3/uL — ABNORMAL HIGH (ref 4.0–10.5)

## 2012-02-08 MED ORDER — SODIUM CHLORIDE 0.9 % IJ SOLN
INTRAMUSCULAR | Status: AC
  Administered 2012-02-08: 12:00:00
  Filled 2012-02-08: qty 10

## 2012-02-08 MED ORDER — LACTATED RINGERS IV BOLUS (SEPSIS)
500.0000 mL | Freq: Once | INTRAVENOUS | Status: AC
  Administered 2012-02-08: 500 mL via INTRAVENOUS

## 2012-02-08 NOTE — Progress Notes (Signed)
1 Day Post-Op  Subjective: No major issues. No flatus. abd soreness. Some low uop this am. About 1 L from ng  Objective: Vital signs in last 24 hours: Temp:  [96.9 F (36.1 C)-98.5 F (36.9 C)] 98.5 F (36.9 C) (11/26 0800) Pulse Rate:  [55-78] 62  (11/26 0300) Resp:  [13-20] 16  (11/26 0458) BP: (99-145)/(39-79) 101/44 mmHg (11/26 0800) SpO2:  [83 %-94 %] 93 % (11/26 0458) FiO2 (%):  [4 %] 4 % (11/26 0800) Weight:  [343 lb 4.1 oz (155.7 kg)] 343 lb 4.1 oz (155.7 kg) (11/25 1740) Last BM Date:  (PTA)  Intake/Output from previous day: 11/25 0701 - 11/26 0700 In: 3975 [I.V.:3875; IV Piggyback:100] Out: 2100 [Urine:445; Emesis/NG output:850; Drains:105; Blood:200] Intake/Output this shift: Total I/O In: 260 [I.V.:250; IV Piggyback:10] Out: 289 [Urine:50; Emesis/NG output:200; Drains:39]  Alert, sitting in chair cta ant, dec bs at bases Obese, soft, binder in place. Drains - serosang No edema  Lab Results:   Julia West 02/08/12 0445 02/07/12 1822  WBC 16.4* 18.2*  HGB 13.9 14.7  HCT 43.0 45.1  PLT 239 237   BMET  Basename 02/08/12 0445 02/07/12 1822 02/07/12 0805  NA 136 -- 136  K 4.1 -- 3.8  CL 102 -- 97  CO2 27 -- 29  GLUCOSE 111* -- 124*  BUN 15 -- 11  CREATININE 0.92 0.87 --  CALCIUM 8.8 -- 9.1   PT/INR No results found for this basename: LABPROT:2,INR:2 in the last 72 hours ABG No results found for this basename: PHART:2,PCO2:2,PO2:2,HCO3:2 in the last 72 hours  Studies/Results: Ct Abdomen Pelvis W Contrast  02/07/2012  *RADIOLOGY REPORT*  Clinical Data: Incarcerated abdominal hernia at the right lower quadrant.  Worsening pain and nausea.  CT ABDOMEN AND PELVIS WITH CONTRAST  Technique:  Multidetector CT imaging of the abdomen and pelvis was performed following the standard protocol during bolus administration of intravenous contrast.  Contrast: OMNIPAQUE IOHEXOL 300 MG/ML  SOLN  Comparison: MRI of the abdomen and pelvis performed 01/04/2012   Findings: Mild bibasilar atelectasis is noted.  The patient is status post median sternotomy.  The liver and spleen are unremarkable in appearance.  The gallbladder is within normal limits.  The pancreas and adrenal glands are unremarkable; slight apparent stranding about the tail of the pancreas likely reflects normal vasculature.  A 5.8 cm cyst is noted at the upper pole of the right kidney.  The kidneys are otherwise unremarkable in appearance.  There is no evidence of hydronephrosis.  No renal or ureteral stones are seen. No perinephric stranding is appreciated.  The stomach is within normal limits.  No acute vascular abnormalities are seen.  Diffuse calcification is noted along the abdominal aorta and its branches.  The appendix is not definitely seen; there is no evidence for appendicitis.  The colon is largely decompressed.  Diffuse diverticulosis is noted along the proximal sigmoid colon; the colon is otherwise unremarkable in appearance.  A moderate-to-large anterior abdominal wall hernia is noted at the right lower quadrant, with a short segment of herniated small bowel.  A small amount of associated fluid is seen, and soft tissue stranding is noted about the herniated small bowel segment.  A small broad-based hernia is noted at the level of the umbilicus.  There is no evidence for bowel obstruction.  The bladder is mildly distended and grossly unremarkable in appearance.  The uterus is within normal limits.  The ovaries are relatively symmetric; no suspicious adnexal masses are seen.  No  inguinal lymphadenopathy is seen.  No acute osseous abnormalities are identified.  IMPRESSION:  1.  Moderate to large anterior abdominal wall hernia at the right lower quadrant, with a short segment of herniated small bowel. Associated soft tissue inflammation and small amount of associated fluid; no evidence for bowel obstruction. 2.  Smaller broad-based hernia at the level of the umbilicus, containing only fat. 3.  5.8  cm right renal cyst noted. 4.  Mild bibasilar atelectasis seen. 5.  Diffuse calcification along the abdominal aorta and its branches. 6.  Diffuse diverticulosis along the proximal sigmoid colon, without evidence of diverticulitis.   Original Report Authenticated By: Julia West, M.D.     Anti-infectives: Anti-infectives     Start     Dose/Rate Route Frequency Ordered Stop   02/08/12 0600   ceFAZolin (ANCEF) 3 g in dextrose 5 % 50 mL IVPB  Status:  Discontinued        3 g 160 mL/hr over 30 Minutes Intravenous On call to O.R. 02/07/12 1754 02/08/12 0748   02/07/12 1100   piperacillin-tazobactam (ZOSYN) IVPB 3.375 g  Status:  Discontinued        3.375 g 12.5 mL/hr over 240 Minutes Intravenous Every 8 hours 02/07/12 0956 02/08/12 0748          Assessment/Plan: s/p Procedure(s) (LRB) with comments: HERNIA REPAIR INCISIONAL () - Open, Primary repair, strangulated Incisional hernia. SMALL BOWEL RESECTION ()  pulm toilet, IS, wean O2 as tolerated Cont NPO xcept ice, ng tube to liws. Await return of bowel function OOB May have chewing gum D/c antibiotics Low uop this am - fluid bolus. Cr ok Cont DVT prophylaxis  Julia Sella. [name], MD, FACS General, Bariatric, & Minimally Invasive Surgery [facility], [place]   LOS: 1 day    Julia West 02/08/2012

## 2012-02-08 NOTE — Op Note (Signed)
[name], West NO.:  [id number]  MEDICAL RECORD NO.:  [id number]  LOCATION:  3303                         FACILITY:  [facility]  PHYSICIAN:  [name]. [name], MD, FACSDATE OF BIRTH:  02-05-1956  DATE OF PROCEDURE:  02/07/2012 DATE OF DISCHARGE:                              OPERATIVE REPORT   PREOPERATIVE DIAGNOSIS:  Incarcerated incisional ventral hernia.  POSTOPERATIVE DIAGNOSIS:  Strangulated incisional ventral hernia.  PROCEDURES: 1. Open primary repair of strangulated incisional hernia. 2. Small bowel resection with side-to-side anastomosis.  SURGEON:  [name]. [name], MD, FACS  ASSISTANT:  Letha Cape, PA  ANESTHESIA:  General.  EBL:  200 mL.  SPECIMEN:  Small bowel and omentum.  DRAINS:  Two 19-French JP drains in the subcutaneous tissue on top of the fascia.  INDICATIONS FOR PROCEDURE:  The patient is a morbidly obese, Caucasian female with a known incisional hernia.  She had seen one of my partners few weeks ago.  At that time, she had two incisional hernias, one at the umbilicus and one in the right lower quadrant, mainly just containing omentum.  She even in the process of getting worked up for planned surgery, which included cardiac clearance.  Unfortunately, the patient developed severe abdominal pain yesterday afternoon.  This was associated with nausea.  She came to the emergency room, was found to have a leukocytosis.  A CT scan was performed, which demonstrated an incarcerated small bowel in the lower midline incisional hernia.  There was associated inflammation.  She was admitted and made n.p.o.  Based on the finding of the incarcerated bowel with elevated white count and abdominal pain, I recommended proceeding to the operating room for exploratory laparotomy, possible small-bowel resection.  I explained to the patient and family at length that given the fact that the inflammation and possible strangulated bowel, mesh  implantation would not be feasible.  I also explained that she is at high risk for hernia recurrence.  We discussed the risks and benefits of surgery including, but not limited to, bleeding, infection, injury to surrounding structures, need to resect bowel, possible ostomy, possible anastomotic stricture, possible anastomotic leakage, high likelihood of hernia recurrence, wound healing complications, blood clot formation, cardiac complications, postoperative ileus, the typical postoperative course with drains and not being able to lift anything greater than 15 pounds for 6 weeks.  The patient elected to proceed to surgery.  DESCRIPTION OF PROCEDURE:  After obtaining informed consent, the patient was taken to the operating room and placed supine on the operating table in OR1 at [facility].  General endotracheal anesthesia was established.  Sequential compression devices were placed.  The Foley catheter was placed.  Her abdomen was prepped and draped in usual standard surgical fashion.  Surgical time-out was performed.  She received antibiotics prior to skin incision.  She received IV Tylenol. I outlined her prior lower midline incision with the marking pen.  I then incised the skin sharply with a #10 blade, excising her old scar. Divided the subcutaneous tissue.  She had an umbilical hernia as well as the right lower midline hernia.  I elected to gain entry to the abdominal cavity supraumbilically.  I divided the  subcutaneous tissue with Bovie electrocautery all the way down to the fascia.  I entered the abdominal cavity.  I reduced the umbilical hernia, which contained just omentum.  I then placed Kochers on the fascia.  I then opened up the remaining midline.  There were some omentum stuck to the midline, this was taken down sharply.  This left the incarcerated omentum and bowel to the right of the midline.  I could not reduce it.  The defect was slightly to the right  of the midline.  I ended up opening up the defect sharply with electrocautery.  There was nonviable necrotic omentum. There was also a small section of small bowel probably about 4 cm that was hyperemic and very congested with congestion in the mesentery.  At this point, I completely took down all the remaining adhesions to the anterior abdominal wall.  Based on how the small bowel looked, I elected to transect and remove this section of small bowel.  A small window was created in the mesentery just next to the small bowel lumen.  The bowel was then transected with a single fire of linear cutter [name] 55 stapler with a blue load.  This was done proximally as well.  I then took down the mesentery in a serial fashion with the LigaSure.  The bowel was brought side-to-side.  Enterotomies were made and one-fourth of stapler was placed through each enterotomy.  The stapler was brought together and fired on the antimesenteric border to create a common channel.  The common defect was then closed with a TA 60 stapler blue load.  I then closed the mesenteric defect with figure-of-eight 2-0 Vicryls.  I then imbricated the staple line with 3-0 silk.  I then placed a crotch suture with 3-0 silk.  We had already ran the remainder of the bowel both proximally and distally all the way down to the cecum.  There was no evidence of ischemia or bowel injury.  It should be noted that the patient had a very mobile cecum that actually bring to the midline.  Her appendix appeared normal.  There was a bunch of serosanguineous ascites in the abdominal cavity that I encountered initially when I went in.  At this point, we irrigated the abdomen.  Nonviable omentum had been resected with LigaSure device.  At this point because of the small bowel finding, I elected not to place any mesh.  Kochers were placed on the right side of the abdominal wall fascia.  I removed the umbilical hernia sac as well as the right lower  quadrant hernia sac.  I then lifted the skin and subcutaneous tissue fat off of the abdominal wall fascia and took it probably about 6 inches laterally.  This was carried over to the left side as well lifting the skin and fat off the fascia about 6 inches laterally on that side.  It was also lifted both superiorly and inferiorly.  At this point, Seprafilm was placed into the abdominal cavity after the small bowel and the omentum had been placed back in the abdomen and Seprafilm was placed on top.  A malleable was placed into the abdomen, we then started to reapproximate the fascia with the running #1 looped PDS, one from below and one from above.  I placed interrupted internal retention sutures consisting of #1 Novafils.  Seven Novafils were placed intermittently between the running PDS suture.  The subcutaneous tissue was irrigated.  We appeared to have hemostasis.  Two drains were  placed through the skin and subcutaneous tissues to lay on top of the fascia, one on the left and one on the right.  These drains were secured to the skin with 2-0 nylon.  The skin was reapproximated with skin staples.  Sterile dressings and drain sponges were applied followed by an abdominal binder.  The patient was extubated and brought to the recovery room in stable addition.  All needle, instrument, and sponge counts were correct x2.  There were no immediate complications. The patient tolerated the procedure well.     [name]. [name], MD, FACS    EMW/MEDQ  D:  02/07/2012  T:  02/08/2012  Job:  [id number]

## 2012-02-08 NOTE — Progress Notes (Signed)
MD notified of low urine output, new orders received will continue  to monitor.

## 2012-02-09 LAB — CBC WITH DIFFERENTIAL/PLATELET
Basophils Relative: 0 % (ref 0–1)
Hemoglobin: 12.7 g/dL (ref 12.0–15.0)
Lymphs Abs: 2.7 10*3/uL (ref 0.7–4.0)
Monocytes Relative: 11 % (ref 3–12)
Neutro Abs: 11.4 10*3/uL — ABNORMAL HIGH (ref 1.7–7.7)
Neutrophils Relative %: 70 % (ref 43–77)
RBC: 4.38 MIL/uL (ref 3.87–5.11)

## 2012-02-09 LAB — BASIC METABOLIC PANEL
GFR calc Af Amer: 87 mL/min — ABNORMAL LOW (ref >90)
GFR calc non Af Amer: 75 mL/min — ABNORMAL LOW (ref >90)
Potassium: 4.2 mEq/L (ref 3.5–5.1)
Sodium: 137 mEq/L (ref 135–145)

## 2012-02-09 MED ORDER — SODIUM CHLORIDE 0.9 % IJ SOLN
INTRAMUSCULAR | Status: AC
  Filled 2012-02-09: qty 10

## 2012-02-09 NOTE — Evaluation (Signed)
Occupational Therapy Evaluation Patient Details Name: Julia West MRN: [id number] DOB: Aug 27, 1955 Today's Date: 02/09/2012 Time: 9562-1308 OT Time Calculation (min): 23 min  OT Assessment / Plan / Recommendation Clinical Impression  Pt is 56 y/o female admitted for HERNIA REPAIR INCISIONAL - Open, Primary repair, strangulated Incisional hernia and SBO repair.  Pt moving well however overall deconditioned. Pt will benefit from skilled OT in the acute setting to maximize I with ADL and ADL mobility. No OT f/u needs anticipated at d/c.    OT Assessment  Patient needs continued OT Services    Follow Up Recommendations  No OT follow up;Supervision/Assistance - 24 hour    Barriers to Discharge      Equipment Recommendations  None recommended by OT;None recommended by PT    Recommendations for Other Services    Frequency  Min 2X/week    Precautions / Restrictions Precautions Precautions: None   Pertinent Vitals/Pain Pt reports abdominal pain when attempting to bend over- otherwise, no c/o pain.     ADL  Grooming: Min guard;Wash/dry hands Where Assessed - Grooming: Supported standing Upper Body Dressing: Set up;Supervision/safety Where Assessed - Upper Body Dressing: Unsupported sitting Lower Body Dressing: Maximal assistance Where Assessed - Lower Body Dressing: Supported sit to stand Toilet Transfer: Hydrographic surveyor Method: Sit to Barista: Materials engineer and Hygiene: Minimal assistance Where Assessed - Engineer, mining and Hygiene: Standing Equipment Used: Gait belt Transfers/Ambulation Related to ADLs: Min guard A for sit to stand; Min HHA for ambulation throughout room ADL Comments: Pt doing very well. States she typically sits on EOB and bring leg onto bed to don socks/shoes. Pt states she feel comfortable with husband assist her with this upon d/c    OT Diagnosis: Generalized  weakness;Acute pain  OT Problem List: Decreased activity tolerance;Impaired balance (sitting and/or standing);Decreased knowledge of use of DME or AE;Decreased knowledge of precautions;Pain OT Treatment Interventions: Self-care/ADL training;DME and/or AE instruction;Therapeutic activities;Patient/family education;Balance training   OT Goals Acute Rehab OT Goals OT Goal Formulation: With patient Time For Goal Achievement: 02/16/12 Potential to Achieve Goals: Good ADL Goals Pt Will Perform Grooming: Independently;Standing at sink ADL Goal: Grooming - Progress: Goal set today Pt Will Transfer to Toilet: Independently;Ambulation ADL Goal: Toilet Transfer - Progress: Goal set today Pt Will Perform Toileting - Clothing Manipulation: Independently;Standing ADL Goal: Toileting - Clothing Manipulation - Progress: Goal set today Pt Will Perform Toileting - Hygiene: Independently;Sit to stand from 3-in-1/toilet ADL Goal: Toileting - Hygiene - Progress: Goal set today Pt Will Perform Tub/Shower Transfer: Shower transfer;Independently;Ambulation  Visit Information  Last OT Received On: 02/09/12 Assistance Needed: +1    Subjective Data  Subjective: My husband will be at home to help me Patient Stated Goal: Return home   Prior Functioning     Home Living Lives With: Spouse Available Help at Discharge: Family;Available 24 hours/day Type of Home: Mobile home Home Access: Stairs to enter Entrance Stairs-Number of Steps: 5 Entrance Stairs-Rails: Can reach both Home Layout: One level Bathroom Shower/Tub: Health visitor: Standard Home Adaptive Equipment: None Prior Function Level of Independence: Independent Able to Take Stairs?: Reciprically Driving: Yes Vocation: On disability Communication Communication: No difficulties Dominant Hand: Right         Vision/Perception     Cognition  Overall Cognitive Status: Appears within functional limits for tasks  assessed/performed Arousal/Alertness: Awake/alert Orientation Level: Appears intact for tasks assessed Behavior During Session: [facility] for tasks performed  Extremity/Trunk Assessment Right Upper Extremity Assessment RUE ROM/Strength/Tone: Within functional levels Left Upper Extremity Assessment LUE ROM/Strength/Tone: Within functional levels     Mobility Transfers Sit to Stand: 4: Min guard;From chair/3-in-1 Stand to Sit: 4: Min guard Details for Transfer Assistance: Minguard for safety with cues for hand placement     Shoulder Instructions     Exercise     Balance     End of Session OT - End of Session Equipment Utilized During Treatment: Gait belt Activity Tolerance: Patient tolerated treatment well Patient left:  (w/c in prep for transfer to another floor) Nurse Communication: Mobility status  GO     Loni Delbridge 02/09/2012, 3:53 PM

## 2012-02-09 NOTE — Progress Notes (Signed)
Patient transferred to [facility] room 11.  Report called to receiving RN and all questions answered. Patient transferred via wheelchair with NT and family aware of new room assignment.

## 2012-02-09 NOTE — Evaluation (Signed)
Physical Therapy Evaluation Patient Details Name: Julia West MRN: [id number] DOB: 1955/03/28 Today's Date: 02/09/2012 Time: 1129-1150 PT Time Calculation (min): 21 min  PT Assessment / Plan / Recommendation Clinical Impression  Pt is 56 y/o female admitted for HERNIA REPAIR INCISIONAL - Open, Primary repair, strangulated Incisional hernia and SBO repair.  Pt moving well however overall deconditioned.  Pt will benefit from acute PT services to improve overall mobility prior to d/c home.    PT Assessment  Patient needs continued PT services    Follow Up Recommendations  No PT follow up    Does the patient have the potential to tolerate intense rehabilitation      Barriers to Discharge None      Equipment Recommendations  None recommended by PT    Recommendations for Other Services     Frequency Min 3X/week    Precautions / Restrictions Precautions Precautions: None Restrictions Weight Bearing Restrictions: No   Pertinent Vitals/Pain No c/o pain     Mobility  Transfers Transfers: Sit to Stand;Stand to Sit Sit to Stand: 4: Min guard;From chair/3-in-1 Stand to Sit: 4: Min guard (w/c to transfer off floor) Details for Transfer Assistance: Minguard for safety with cues for hand placement Ambulation/Gait Ambulation/Gait Assistance: 4: Min guard Ambulation Distance (Feet): 15 Feet Ambulation/Gait Assistance Details: minguard for safety Gait Pattern: Step-through pattern;Shuffle;Wide base of support;Antalgic Gait velocity: very slow cadence    Shoulder Instructions     Exercises     PT Diagnosis: Generalized weakness;Acute pain;Difficulty walking  PT Problem List: Decreased strength;Decreased activity tolerance;Decreased balance;Decreased mobility;Decreased knowledge of use of DME;Pain PT Treatment Interventions: DME instruction;Gait training;Stair training;Functional mobility training;Therapeutic activities;Therapeutic exercise;Balance training;Patient/family  education   PT Goals Acute Rehab PT Goals PT Goal Formulation: With patient Time For Goal Achievement: 02/16/12 Potential to Achieve Goals: Good Pt will go Supine/Side to Sit: with modified independence PT Goal: Supine/Side to Sit - Progress: Goal set today Pt will go Sit to Supine/Side: with modified independence PT Goal: Sit to Supine/Side - Progress: Goal set today Pt will go Sit to Stand: with modified independence PT Goal: Sit to Stand - Progress: Goal set today Pt will Ambulate: >150 feet;with modified independence;with least restrictive assistive device PT Goal: Ambulate - Progress: Goal set today Pt will Go Up / Down Stairs: 3-5 stairs;with modified independence;with least restrictive assistive device PT Goal: Up/Down Stairs - Progress: Goal set today  Visit Information  Last PT Received On: 02/09/12 Assistance Needed: +1    Subjective Data  Subjective: "I'm doing ok.  Just wish I wasn't here and unable to eat on Thanksgiving." Patient Stated Goal: To return home soon.   Prior Functioning  Home Living Lives With: Spouse Available Help at Discharge: Family;Available 24 hours/day Type of Home: Mobile home Home Access: Stairs to enter Entrance Stairs-Number of Steps: 5 Entrance Stairs-Rails: Can reach both Home Layout: One level Bathroom Shower/Tub: Health visitor: Standard Home Adaptive Equipment: None Prior Function Level of Independence: Independent Able to Take Stairs?: Reciprically Driving: Yes Vocation: On disability Communication Communication: No difficulties Dominant Hand: Right    Cognition  Overall Cognitive Status: Appears within functional limits for tasks assessed/performed Arousal/Alertness: Awake/alert Orientation Level: Appears intact for tasks assessed Behavior During Session: [facility] for tasks performed    Extremity/Trunk Assessment Right Lower Extremity Assessment RLE ROM/Strength/Tone: Within functional levels Left Lower  Extremity Assessment LLE ROM/Strength/Tone: Within functional levels   Balance    End of Session PT - End of Session Equipment Utilized During  Treatment: Gait belt;Oxygen (NG tube) Activity Tolerance: Patient tolerated treatment well Patient left:  (w/c to transfer floors) Nurse Communication: Mobility status  GP     Yochanan Eddleman 02/09/2012, 1:08 PM. Jake Shark, PT DPT [phone]

## 2012-02-09 NOTE — Progress Notes (Signed)
2 Days Post-Op  Subjective: Some flatus. Wants food. Pain ok.   Objective: Vital signs in last 24 hours: Temp:  [97.8 F (36.6 C)-98.9 F (37.2 C)] 98.9 F (37.2 C) (11/27 0737) Pulse Rate:  [59-72] 72  (11/27 0340) Resp:  [16-19] 19  (11/27 0340) BP: (107-135)/(42-57) 135/49 mmHg (11/27 0340) SpO2:  [90 %-91 %] 91 % (11/27 0340) Last BM Date:  (PTA)  Intake/Output from previous day: 11/26 0701 - 11/27 0700 In: 3885 [I.V.:3375; IV Piggyback:510] Out: 2124 [Urine:550; Emesis/NG output:1300; Drains:274] Intake/Output this shift: Total I/O In: -  Out: 530 [Urine:200; Emesis/NG output:300; Drains:30]  Alert, sitting in chair cta ant with decrease BS at bases Reg Obese, incision c/d/i; a little skin ischemia at umbilicus. Drains - old blood, hypoBS No edema  Lab Results:   Basename 02/09/12 0500 02/08/12 0445  WBC 16.3* 16.4*  HGB 12.7 13.9  HCT 39.8 43.0  PLT 200 239   BMET  Basename 02/09/12 0500 02/08/12 0445  NA 137 136  K 4.2 4.1  CL 103 102  CO2 24 27  GLUCOSE 108* 111*  BUN 19 15  CREATININE 0.84 0.92  CALCIUM 8.5 8.8   PT/INR No results found for this basename: LABPROT:2,INR:2 in the last 72 hours ABG No results found for this basename: PHART:2,PCO2:2,PO2:2,HCO3:2 in the last 72 hours  Studies/Results: No results found.  Anti-infectives: Anti-infectives     Start     Dose/Rate Route Frequency Ordered Stop   02/08/12 0600   ceFAZolin (ANCEF) 3 g in dextrose 5 % 50 mL IVPB  Status:  Discontinued        3 g 160 mL/hr over 30 Minutes Intravenous On call to O.R. 02/07/12 1754 02/08/12 0748   02/07/12 1100   piperacillin-tazobactam (ZOSYN) IVPB 3.375 g  Status:  Discontinued        3.375 g 12.5 mL/hr over 240 Minutes Intravenous Every 8 hours 02/07/12 0956 02/08/12 0748          Assessment/Plan: s/p Procedure(s) (LRB) with comments: HERNIA REPAIR INCISIONAL () - Open, Primary repair, strangulated Incisional hernia. SMALL BOWEL RESECTION  ()  tx to floor PT/OT Cont NG tube - 1300cc out (pt taking ice chips but still bilious) Ice chips D/c foley, Cr ok. uop acceptable Cont VTE prophylaxis  Julia West. [name], MD, FACS General, Bariatric, & Minimally Invasive Surgery [facility], [place]   LOS: 2 days    Julia West 02/09/2012

## 2012-02-10 LAB — CBC
HCT: 34.2 % — ABNORMAL LOW (ref 36.0–46.0)
Hemoglobin: 11 g/dL — ABNORMAL LOW (ref 12.0–15.0)
Hemoglobin: 12 g/dL (ref 12.0–15.0)
MCH: 28.9 pg (ref 26.0–34.0)
MCV: 88.7 fL (ref 78.0–100.0)
RBC: 3.81 MIL/uL — ABNORMAL LOW (ref 3.87–5.11)
RBC: 4.15 MIL/uL (ref 3.87–5.11)
WBC: 12.7 10*3/uL — ABNORMAL HIGH (ref 4.0–10.5)
WBC: 12.7 10*3/uL — ABNORMAL HIGH (ref 4.0–10.5)

## 2012-02-10 LAB — BASIC METABOLIC PANEL
Chloride: 105 mEq/L (ref 96–112)
GFR calc Af Amer: >90 mL/min (ref >90)
GFR calc non Af Amer: >90 mL/min (ref >90)
Potassium: 3.8 mEq/L (ref 3.5–5.1)
Sodium: 138 mEq/L (ref 135–145)

## 2012-02-10 NOTE — Progress Notes (Signed)
Lots of flatus But had >1300 bilious NG output  Alert, nad Drains - serosang, obese, soft, incision c/d/i - stable ischemia to umbilcus   Will CONT ng tube to LIWS - no clamping today since still with high output of bilious O/w plan per pa  [name]. [name], MD, FACS General, Bariatric, & Minimally Invasive Surgery [facility], [place]

## 2012-02-10 NOTE — Progress Notes (Signed)
Patient ID: Julia West, female   DOB: 03/04/1956, 56 y.o.   MRN: [id number] 3 Days Post-Op  Subjective: Doing well overall, passing lots of flatus, wants to eat, denies n/v.  Pain over controlled with PCA  Objective: Vital signs in last 24 hours: Temp:  [97.5 F (36.4 C)-99 F (37.2 C)] 97.9 F (36.6 C) (11/28 0641) Pulse Rate:  [58-89] 64  (11/28 0641) Resp:  [17-23] 20  (11/28 0641) BP: (107-143)/(47-65) 141/56 mmHg (11/28 0641) SpO2:  [89 %-98 %] 95 % (11/28 0641) Last BM Date:  (PTA)  Intake/Output from previous day: 11/27 0701 - 11/28 0700 In: 2380 [I.V.:2280; NG/GT:100] Out: 3490 [Urine:1900; Emesis/NG output:1300; Drains:290] Intake/Output this shift:    PE: Abd: obese but soft, mildly tender around incision, active bowel sounds  Drains:  #1(LLQ): 25cc #2(RLQ): 265cc  Lab Results:   Basename 02/10/12 0630 02/09/12 0500  WBC 12.7* 16.3*  HGB 11.0* 12.7  HCT 34.2* 39.8  PLT 192 200   BMET  Basename 02/10/12 0630 02/09/12 0500  NA 138 137  K 3.8 4.2  CL 105 103  CO2 26 24  GLUCOSE 104* 108*  BUN 11 19  CREATININE 0.66 0.84  CALCIUM 8.3* 8.5   PT/INR No results found for this basename: LABPROT:2,INR:2 in the last 72 hours CMP     Component Value Date/Time   NA 138 02/10/2012 0630   K 3.8 02/10/2012 0630   CL 105 02/10/2012 0630   CO2 26 02/10/2012 0630   GLUCOSE 104* 02/10/2012 0630   BUN 11 02/10/2012 0630   CREATININE 0.66 02/10/2012 0630   CALCIUM 8.3* 02/10/2012 0630   GFRNONAA >90 02/10/2012 0630   GFRAA >90 02/10/2012 0630   Lipase  No results found for this basename: lipase       Studies/Results: No results found.  Anti-infectives: Anti-infectives     Start     Dose/Rate Route Frequency Ordered Stop   02/08/12 0600   ceFAZolin (ANCEF) 3 g in dextrose 5 % 50 mL IVPB  Status:  Discontinued        3 g 160 mL/hr over 30 Minutes Intravenous On call to O.R. 02/07/12 1754 02/08/12 0748   02/07/12 1100    piperacillin-tazobactam (ZOSYN) IVPB 3.375 g  Status:  Discontinued        3.375 g 12.5 mL/hr over 240 Minutes Intravenous Every 8 hours 02/07/12 0956 02/08/12 0748           Assessment/Plan 1. POD#2-Open primary repair of strangulated incisional hernia, SBR: +flatus and active bowel sounds, pain controlled  --clamp NGT, if tolerates for 5-6 hours then d/c NGT and start clears  --OOB and ambulater  --will consider d/cing PCA if able to take in clears and start PO pain meds.  --dressing changes  --keep drains today and monitor output.   LOS: 3 days    Pravin Perezperez 02/10/2012

## 2012-02-11 NOTE — Progress Notes (Signed)
Patient tolerated 4 hours with NG clamped with no nausea or discomfort.  NG to LIS for one hour, then will continue 4 hours clamped, 1 hour to LIS.  Will continue to monitor.

## 2012-02-11 NOTE — Progress Notes (Signed)
JP care instructions given to patient.  Patient observed and demonstrated emptying of JP drain.  Instructed patient on recording JP output, dressing change to JP site and signs and symptoms of infection.  Verbalizes understanding with no further questions.  Will continue to reinforce teaching.

## 2012-02-11 NOTE — Progress Notes (Signed)
Sitting in chair, doing IS +flatus 600 ng tube  Obese, soft, serosang drains; stable skin necrosis at umbilicus  Ng clamping trials today If tolerates, d/c ng in am, clears  [name]. [name], MD, FACS General, Bariatric, & Minimally Invasive Surgery [facility], [place]

## 2012-02-11 NOTE — Progress Notes (Signed)
PT Cancellation Note  Patient Details Name: Julia West MRN: [id number] DOB: Jul 16, 1955   Cancelled Treatment:    Reason Eval/Treat Not Completed: Fatigue/lethargy limiting ability to participate (pt indicates she had ambulated with Nsg earlier this am.  )  Will f/u another time.   Sunny Schlein, [place] [phone] 02/11/2012, 11:59 AM

## 2012-02-11 NOTE — Progress Notes (Signed)
Patient ID: Julia West, female   DOB: 04/25/1955, 56 y.o.   MRN: [id number] 4 Days Post-Op  Subjective: Pt feels well.  Passing some flatus since yesterday.  Eating lots of ice chips she says (4-5 cups)  Objective: Vital signs in last 24 hours: Temp:  [98.1 F (36.7 C)-99.7 F (37.6 C)] 99.7 F (37.6 C) (11/29 0610) Pulse Rate:  [60-77] 77  (11/29 0610) Resp:  [18-24] 22  (11/29 0733) BP: (125-148)/(52-71) 148/52 mmHg (11/29 0610) SpO2:  [93 %-98 %] 93 % (11/29 0733) Last BM Date: 02/07/12  Intake/Output from previous day: 11/28 0701 - 11/29 0700 In: 3097.5 [I.V.:3087.5; IV Piggyback:10] Out: 3040 [Urine:2200; Emesis/NG output:600; Drains:240] Intake/Output this shift:    PE: Abd: soft, appropriately tender, incision c/d/i with staples, there is some umbilical necrosis and incisional necrosis of the skin, but wound still intact. Hypoactive BS, JPs with serosang output  Lab Results:   Basename 02/10/12 1000 02/10/12 0630  WBC 12.7* 12.7*  HGB 12.0 11.0*  HCT 36.8 34.2*  PLT 217 192   BMET  Basename 02/10/12 0630 02/09/12 0500  NA 138 137  K 3.8 4.2  CL 105 103  CO2 26 24  GLUCOSE 104* 108*  BUN 11 19  CREATININE 0.66 0.84  CALCIUM 8.3* 8.5   PT/INR No results found for this basename: LABPROT:2,INR:2 in the last 72 hours CMP     Component Value Date/Time   NA 138 02/10/2012 0630   K 3.8 02/10/2012 0630   CL 105 02/10/2012 0630   CO2 26 02/10/2012 0630   GLUCOSE 104* 02/10/2012 0630   BUN 11 02/10/2012 0630   CREATININE 0.66 02/10/2012 0630   CALCIUM 8.3* 02/10/2012 0630   GFRNONAA >90 02/10/2012 0630   GFRAA >90 02/10/2012 0630   Lipase  No results found for this basename: lipase       Studies/Results: No results found.  Anti-infectives: Anti-infectives     Start     Dose/Rate Route Frequency Ordered Stop   02/08/12 0600   ceFAZolin (ANCEF) 3 g in dextrose 5 % 50 mL IVPB  Status:  Discontinued        3 g 160 mL/hr over 30 Minutes  Intravenous On call to O.R. 02/07/12 1754 02/08/12 0748   02/07/12 1100   piperacillin-tazobactam (ZOSYN) IVPB 3.375 g  Status:  Discontinued        3.375 g 12.5 mL/hr over 240 Minutes Intravenous Every 8 hours 02/07/12 0956 02/08/12 0748           Assessment/Plan  1. S/p ex lap with primary incisional hernia repair and SBR 2. Post op ileus 3. Morbid obesity 4. HTN  Plan: 1. Will try clamping trials today.  If she tolerates well, will dc NGT tomorrow.  Most of patient's NGT output seems to be secondary to the amount of ice chips she is eating.  2. Cont mobilization and pulm toilet 3. Cont JP drains for now   LOS: 4 days    Altair Appenzeller E 02/11/2012, 9:08 AM Pager: [phone]

## 2012-02-12 LAB — CBC
HCT: 32.6 % — ABNORMAL LOW (ref 36.0–46.0)
Hemoglobin: 10.6 g/dL — ABNORMAL LOW (ref 12.0–15.0)
MCHC: 32.5 g/dL (ref 30.0–36.0)
MCV: 88.3 fL (ref 78.0–100.0)
RDW: 15.4 % (ref 11.5–15.5)

## 2012-02-12 NOTE — Progress Notes (Signed)
Doing well. Tolerated clamp. Small NG  Obese, soft, incision c/d/i. -stable skin ischemia at umbilicus Drains - serosang  D/c ngt Clears prob home Monday  [name]. [name], MD, FACS General, Bariatric, & Minimally Invasive Surgery [facility], [place]

## 2012-02-12 NOTE — Progress Notes (Signed)
5 Days Post-Op  Subjective: Pt feeling good today.  Pt hungry and thirsty.  Pt urinating well and passing flatus, also small watery BM yesterday.  Pt tolerating ice chips and NG being clamped.  Pt is up ambulating through the halls and using her IS.    Objective: Vital signs in last 24 hours: Temp:  [97.7 F (36.5 C)-99.1 F (37.3 C)] 98.4 F (36.9 C) (11/30 0504) Pulse Rate:  [59-78] 68  (11/30 0504) Resp:  [18-25] 24  (11/30 0504) BP: (102-144)/(48-88) 144/51 mmHg (11/30 0504) SpO2:  [90 %-94 %] 92 % (11/30 0504) Last BM Date: 02/07/12  Intake/Output from previous day: 11/29 0701 - 11/30 0700 In: 1740 [I.V.:1730; IV Piggyback:10] Out: 2455 [Urine:2000; Emesis/NG output:350; Drains:105] Intake/Output this shift:    PE: Gen:  Alert, NAD, pleasant Abd: soft, appropriately tender, incision c/d/i. +BS, JPs with sero, but mostly sang output   Lab Results:   Basename 02/12/12 0635 02/10/12 1000  WBC 9.4 12.7*  HGB 10.6* 12.0  HCT 32.6* 36.8  PLT 194 217   BMET  Basename 02/10/12 0630  NA 138  K 3.8  CL 105  CO2 26  GLUCOSE 104*  BUN 11  CREATININE 0.66  CALCIUM 8.3*   PT/INR No results found for this basename: LABPROT:2,INR:2 in the last 72 hours CMP     Component Value Date/Time   NA 138 02/10/2012 0630   K 3.8 02/10/2012 0630   CL 105 02/10/2012 0630   CO2 26 02/10/2012 0630   GLUCOSE 104* 02/10/2012 0630   BUN 11 02/10/2012 0630   CREATININE 0.66 02/10/2012 0630   CALCIUM 8.3* 02/10/2012 0630   GFRNONAA >90 02/10/2012 0630   GFRAA >90 02/10/2012 0630   Lipase  No results found for this basename: lipase       Studies/Results: No results found.  Anti-infectives: Anti-infectives     Start     Dose/Rate Route Frequency Ordered Stop   02/08/12 0600   ceFAZolin (ANCEF) 3 g in dextrose 5 % 50 mL IVPB  Status:  Discontinued        3 g 160 mL/hr over 30 Minutes Intravenous On call to O.R. 02/07/12 1754 02/08/12 0748   02/07/12 1100    piperacillin-tazobactam (ZOSYN) IVPB 3.375 g  Status:  Discontinued        3.375 g 12.5 mL/hr over 240 Minutes Intravenous Every 8 hours 02/07/12 0956 02/08/12 0748           Assessment/Plan 1. S/p ex lap with primary incisional hernia repair and SBR  2. Post op ileus  3. Morbid obesity  4. HTN   Plan:  1. Tolerated NG clamping well, will d/c NG and start on clears 2. Cont mobilization and pulm toilet  3. Cont JP drains for now    LOS: 5 days    DORT, Atleigh Gruen 02/12/2012, 8:30 AM Pager: [phone]

## 2012-02-13 LAB — CBC
HCT: 31.3 % — ABNORMAL LOW (ref 36.0–46.0)
MCV: 86.2 fL (ref 78.0–100.0)
RDW: 15.1 % (ref 11.5–15.5)
WBC: 9.7 10*3/uL (ref 4.0–10.5)

## 2012-02-13 MED ORDER — HYDROMORPHONE HCL PF 1 MG/ML IJ SOLN: 0.5000 mg | INTRAMUSCULAR | Status: DC | PRN

## 2012-02-13 MED ORDER — HYDROCODONE-ACETAMINOPHEN 5-325 MG PO TABS
0.5000 | ORAL_TABLET | ORAL | Status: DC | PRN
  Administered 2012-02-13: 2 via ORAL
  Administered 2012-02-14: 1 via ORAL
  Filled 2012-02-13: qty 2
  Filled 2012-02-13: qty 1

## 2012-02-13 NOTE — Progress Notes (Signed)
Doing well.  No n/v.  Obese. Soft, incision c/d/i -stable umbilical skin ishcemia  Adv diet as tolerated Home Monday if no issues today F/u with me in 3-4 weeks F/u with my nurse 12/9 for staple removal.  Will pull drains only when output <30cc/day for 2 days in row  Zaire Vanbuskirk M. [name], MD, FACS General, Bariatric, & Minimally Invasive Surgery [facility], [place]

## 2012-02-13 NOTE — Progress Notes (Signed)
6 Days Post-Op  Subjective: Pt much improved today.  Pt denies any significant pain, n/v.  Pt tolerating diet well and had a BM today and is passing flatus.  Pt up ambulating through the halls.    Objective: Vital signs in last 24 hours: Temp:  [98.4 F (36.9 C)-100 F (37.8 C)] 99 F (37.2 C) (12/01 0528) Pulse Rate:  [55-64] 56  (12/01 0528) Resp:  [15-24] 20  (12/01 0528) BP: (122-145)/(45-60) 130/45 mmHg (12/01 0528) SpO2:  [91 %-95 %] 94 % (12/01 0528) FiO2 (%):  [91 %] 91 % (12/01 0802) Last BM Date: 02/12/12  Intake/Output from previous day: 11/30 0701 - 12/01 0700 In: 2995 [P.O.:1200; I.V.:1795] Out: 3100 [Urine:2800; Drains:300] Intake/Output this shift:    PE: Gen:  Alert, NAD, pleasant Abd: Soft, NT/ND, +BS, no HSM, stable skin ischemia at umbilicus, incisions C/D/I, drain with sanguinous drainage   Lab Results:   Basename 02/13/12 0730 02/12/12 0635  WBC 9.7 9.4  HGB 10.2* 10.6*  HCT 31.3* 32.6*  PLT 204 194   BMET No results found for this basename: NA:2,K:2,CL:2,CO2:2,GLUCOSE:2,BUN:2,CREATININE:2,CALCIUM:2 in the last 72 hours PT/INR No results found for this basename: LABPROT:2,INR:2 in the last 72 hours CMP     Component Value Date/Time   NA 138 02/10/2012 0630   K 3.8 02/10/2012 0630   CL 105 02/10/2012 0630   CO2 26 02/10/2012 0630   GLUCOSE 104* 02/10/2012 0630   BUN 11 02/10/2012 0630   CREATININE 0.66 02/10/2012 0630   CALCIUM 8.3* 02/10/2012 0630   GFRNONAA >90 02/10/2012 0630   GFRAA >90 02/10/2012 0630   Lipase  No results found for this basename: lipase       Studies/Results: No results found.  Anti-infectives: Anti-infectives     Start     Dose/Rate Route Frequency Ordered Stop   02/08/12 0600   ceFAZolin (ANCEF) 3 g in dextrose 5 % 50 mL IVPB  Status:  Discontinued        3 g 160 mL/hr over 30 Minutes Intravenous On call to O.R. 02/07/12 1754 02/08/12 0748   02/07/12 1100   piperacillin-tazobactam (ZOSYN) IVPB 3.375 g   Status:  Discontinued        3.375 g 12.5 mL/hr over 240 Minutes Intravenous Every 8 hours 02/07/12 0956 02/08/12 0748           Assessment/Plan 1. S/p ex lap with primary incisional hernia repair and SBR  2. Post op ileus  3. Morbid obesity  4. HTN   Plan:  1. Prog to fulls today, then heart health diet tomorrow as tolerated 2. Cont mobilization and pulm toilet  3. Cont JP drains for now  4. D/c'ed PCA and started on orals 5.  Plan for D/C tomorrow if improved      LOS: 6 days    DORT, Angad Nabers 02/13/2012, 9:00 AM Pager: [phone]

## 2012-02-14 ENCOUNTER — Telehealth (INDEPENDENT_AMBULATORY_CARE_PROVIDER_SITE_OTHER): Payer: Self-pay | Admitting: General Surgery

## 2012-02-14 MED ORDER — HYDROCODONE-ACETAMINOPHEN 5-325 MG PO TABS: 1.0000 | ORAL_TABLET | ORAL | Status: DC | PRN

## 2012-02-14 NOTE — Telephone Encounter (Signed)
Pt called to report that her drain output has increased since last emptied at 1pm.She was discharged today and has been walking some this pm. She stated"the drainage looks the same as when she was in the hospital, dark red". I asked if it was bright red and she said not at this time. Her output from drain #2 was 100cc between 1pm and 3:30pm, drain #1 was 80cc. I reviewed this with Dr. Fredonia Highland and he said that there may be some pockets of fluid draining, plus increased activity. He said if drains continue to have increased output that she may need to be seen in office tomorrow. I advised her to call office this afternoon if color changes, or call on call Dr. This evening. Otherwise she will call in am to report drainage./gy

## 2012-02-14 NOTE — Discharge Summary (Signed)
Patient ID: MARIHA SLEEPER MRN: [id number] DOB/AGE: 1955/05/06 56 y.o.  Admit date: 02/07/2012 Discharge date: 02/14/2012  Procedures:  1. Open primary repair of strangulated incisional hernia.  2. Small bowel resection with side-to-side anastomosis.  Consults: None  Reason for Admission: 56 yo wf morbidly obese who saw Dr. Derrell Lolling recently for lower abd pain. An MRI showed her to have an incarcerated hernia below umbilicus containing only omental fat. She was lifting some groceries tonight and her pain worsened and the bulge seemed to get bigger. No sign of obstruction.  Admission Diagnoses:  1. Large incarcerated hernia 2. Morbid obesity  Hospital Course: The patient was admitted and taken to the operating room where she had an exploratory laparotomy with SBR and primary repair of hernia.  She had 2 JP drains placed in her subcutaneous tissue for drainage.  She had an NGT placed during surgery and this remained in place.  She had a post op ileus for several days.  On POD# 4, she began passing some flatus.  Her NGT was clamped.  She tolerated her NGT clamping trials and it was removed the following day.  Her diet was then able to be advanced as tolerated.  She otherwise remained stable throughout her admission.  PE: Abd: soft, wound intact with staples.  No drainage or erythema.  JP drains intact with serosang output  Discharge Diagnoses:  Patient Active Problem List  Diagnosis  . DYSLIPIDEMIA  . OBESITY, MORBID  . HYPERTENSION  . CAD  incarcerated ventral hernia S/p ex lap with SBR and primary repair of hernia   Discharge Medications:   Medication List     As of 02/14/2012 11:17 AM    TAKE these medications         aspirin 81 MG tablet   Take 81 mg by mouth daily.      diclofenac 75 MG EC tablet   Commonly known as: VOLTAREN   Take 75 mg by mouth 2 (two) times daily.      fish oil-omega-3 fatty acids 1000 MG capsule   Take 2 g by mouth daily.      furosemide 20  MG tablet   Commonly known as: LASIX   Take 10 mg by mouth daily.      HYDROcodone-acetaminophen 5-325 MG per tablet   Commonly known as: NORCO/VICODIN   Take 1-2 tablets by mouth every 4 (four) hours as needed (take 1/2 tab for mild pain, take 1 tab for moderate pain, take 2 tabs for severe pain).      metoprolol 50 MG tablet   Commonly known as: LOPRESSOR   Take 50 mg by mouth 2 (two) times daily.      rosuvastatin 10 MG tablet   Commonly known as: CRESTOR   Take 10 mg by mouth daily.      valsartan 160 MG tablet   Commonly known as: DIOVAN   Take 160 mg by mouth daily.      zolpidem 10 MG tablet   Commonly known as: AMBIEN   Take 10 mg by mouth at bedtime as needed. For sleep        Discharge Instructions:     Follow-up Information    Follow up with [name], MD,FACS. Schedule an appointment as soon as possible for a visit in 3 weeks.   Contact information:   [address] [place] [address] [phone]       Please follow up. (our office will call you with  nurse visit)          Signed: Letha Cape 02/14/2012, 11:17 AM

## 2012-02-14 NOTE — Discharge Summary (Signed)
Agree with above.   Follow up with Dr. Derrell Lolling.

## 2012-02-14 NOTE — Progress Notes (Signed)
Patient discharged to home with family.  Discharge teaching completed including follow up care, medications, JP drain care and signs and symptoms of infection.  Verbalizes understanding with no further questions.  Correctly demonstrated JP care, including emptying, recording output and dressing change.  Vital signs stable, no complaints of pain.  Tolerating regular diet with no nausea. Discharged per wheelchair with family.

## 2012-02-14 NOTE — Telephone Encounter (Signed)
Message copied by Liliana Cline on Mon Feb 14, 2012  3:22 PM ------      Message from: Littie Deeds      Created: Mon Feb 14, 2012 10:16 AM                   ----- Message -----         From: Letha Cape, PA         Sent: 02/14/2012  10:13 AM           To: [facility] Clinical Pool            This is a patient who had an ex lap with hernia repair (no mesh) by EW.  She needs to see his nurse early next week (mon-wed) for staple removal and drains removed.  Please call her with this appointment time.  She is going home today.            Thanks,       [employer]

## 2012-02-15 ENCOUNTER — Other Ambulatory Visit (HOSPITAL_COMMUNITY): Payer: Medicare Other

## 2012-02-15 NOTE — Telephone Encounter (Signed)
Patient called back. Her drainage decreased to 40 CC in Drain #1 and 55 cc in Drain #2 last night. This morning both drains were at 20 CC. Patient states a small amount of redness at drain site. No pain. No fever. I advised it is common to have a small amount of redness from irritation at the drain site. Patient to call back if redness spreads. Appt made for nurse visit on Monday.

## 2012-02-17 ENCOUNTER — Ambulatory Visit (INDEPENDENT_AMBULATORY_CARE_PROVIDER_SITE_OTHER): Payer: Medicare Other | Admitting: General Surgery

## 2012-02-17 ENCOUNTER — Encounter (INDEPENDENT_AMBULATORY_CARE_PROVIDER_SITE_OTHER): Payer: Self-pay | Admitting: General Surgery

## 2012-02-17 VITALS — BP 138/64 | HR 72 | Temp 97.7°F | Resp 28 | Ht 67.0 in | Wt 330.2 lb

## 2012-02-17 DIAGNOSIS — Z09 Encounter for follow-up examination after completed treatment for conditions other than malignant neoplasm: Secondary | ICD-10-CM

## 2012-02-17 MED ORDER — DOXYCYCLINE HYCLATE 100 MG PO TABS: 100.0000 mg | ORAL_TABLET | Freq: Two times a day (BID) | ORAL | Status: DC

## 2012-02-17 NOTE — Patient Instructions (Addendum)
Keep up the current wound care regimen Keep recording drain output Keep appt this Monday with [facility]

## 2012-02-17 NOTE — Progress Notes (Signed)
Subjective:     Patient ID: Julia West, female   DOB: 1956-01-27, 56 y.o.   MRN: [id number]  HPI 56 year old morbidly obese Caucasian female comes in today because of concerns about her left lower quadrant drain site. The patient underwent emergent exploratory laparotomy with small bowel resection and primary repair of a strangulated incisional hernia about 10 days ago. She denies any fever, chills, nausea, vomiting, diarrhea or constipation. She is no longer taking any narcotics. She reports a decent appetite. There is drains several times a day. She is averaging 80-100 cc per day per drain.  Review of Systems     Objective:   Physical Exam BP 138/64  Pulse 72  Temp 97.7 F (36.5 C) (Temporal)  Resp 28  Ht 5\' 7"  (1.702 m)  Wt 330 lb 3.2 oz (149.778 kg)  BMI 51.72 kg/m2 Morbidly obese wf in NAD Soft, obese, nontender; midline incision c/d/i. Periumbilical skin ischemia actually improved since hospital d/c - less ischemic looking - still dark but viable. Very distal lower midline incision intact - a few blisters near several staples in lower midline. Trace erythema last 1-2 inches of incisions.  LLQ drain site has about 4mm circumferential blanching erythema. Drains sersoang    Assessment:     S/p open primary repair of strangulated incisional hernia with SB resection    Plan:     Overall I think she is doing well. I'm very pleased that the periumbilical skin is improving. She is still having too much drainage to remove the surgical drains. She has developed some mild erythema around the left lower quadrant drain site. I will start her on some doxycycline. She was encouraged to continue recording her drain output. She is scheduled to see my nurse on Monday to have her staples removed. She is to followup with me in about 2 weeks. They are advised to call if the redness spreads  [name]. [name], MD, FACS General, Bariatric, & Minimally Invasive Surgery [facility],  [place]

## 2012-02-21 ENCOUNTER — Ambulatory Visit (INDEPENDENT_AMBULATORY_CARE_PROVIDER_SITE_OTHER): Payer: Medicare Other | Admitting: General Surgery

## 2012-02-21 ENCOUNTER — Ambulatory Visit (HOSPITAL_COMMUNITY): Admission: RE | Admit: 2012-02-21 | Payer: Medicare Other | Source: Ambulatory Visit | Admitting: General Surgery

## 2012-02-21 ENCOUNTER — Encounter (HOSPITAL_COMMUNITY): Admission: RE | Payer: Self-pay | Source: Ambulatory Visit

## 2012-02-21 DIAGNOSIS — Z4802 Encounter for removal of sutures: Secondary | ICD-10-CM

## 2012-02-21 SURGERY — REPAIR, HERNIA, INCISIONAL, LAPAROSCOPIC
Anesthesia: General

## 2012-02-21 NOTE — Progress Notes (Signed)
Patient comes in status post hernia repair with staples and two drains still in place. Drains are both over 30 cc in 24 hours so they were both kept in place. Every other staple was removed. Patient still has redness around her drains and bottom of her staple line. Dr Dwain Sarna examined patient and stated for patient to keep an eye on the area and see Dr [name] later this week. She is still on her antibiotics. Appt made for Thursday and patient instructed to call sooner if any drainage from wound, change in drain color/smell from drains or fevers.

## 2012-02-21 NOTE — Patient Instructions (Signed)
Keep Appt with Dr [name]

## 2012-02-22 ENCOUNTER — Encounter (HOSPITAL_COMMUNITY): Payer: Self-pay | Admitting: *Deleted

## 2012-02-22 ENCOUNTER — Emergency Department (HOSPITAL_COMMUNITY)
Admission: EM | Admit: 2012-02-22 | Discharge: 2012-02-23 | Disposition: A | Discharge status: Home / Self Care | Payer: Medicare Other | Attending: Emergency Medicine | Admitting: Emergency Medicine

## 2012-02-22 ENCOUNTER — Telehealth (INDEPENDENT_AMBULATORY_CARE_PROVIDER_SITE_OTHER): Payer: Self-pay | Admitting: General Surgery

## 2012-02-22 DIAGNOSIS — Z8659 Personal history of other mental and behavioral disorders: Secondary | ICD-10-CM | POA: Insufficient documentation

## 2012-02-22 DIAGNOSIS — T8131XA Disruption of external operation (surgical) wound, not elsewhere classified, initial encounter: Secondary | ICD-10-CM | POA: Insufficient documentation

## 2012-02-22 DIAGNOSIS — Z87891 Personal history of nicotine dependence: Secondary | ICD-10-CM | POA: Insufficient documentation

## 2012-02-22 DIAGNOSIS — Z7982 Long term (current) use of aspirin: Secondary | ICD-10-CM | POA: Insufficient documentation

## 2012-02-22 DIAGNOSIS — Z79899 Other long term (current) drug therapy: Secondary | ICD-10-CM | POA: Insufficient documentation

## 2012-02-22 DIAGNOSIS — I2510 Atherosclerotic heart disease of native coronary artery without angina pectoris: Secondary | ICD-10-CM | POA: Insufficient documentation

## 2012-02-22 DIAGNOSIS — E785 Hyperlipidemia, unspecified: Secondary | ICD-10-CM | POA: Insufficient documentation

## 2012-02-22 DIAGNOSIS — M1990 Unspecified osteoarthritis, unspecified site: Secondary | ICD-10-CM | POA: Insufficient documentation

## 2012-02-22 DIAGNOSIS — T8130XA Disruption of wound, unspecified, initial encounter: Secondary | ICD-10-CM

## 2012-02-22 DIAGNOSIS — Y849 Medical procedure, unspecified as the cause of abnormal reaction of the patient, or of later complication, without mention of misadventure at the time of the procedure: Secondary | ICD-10-CM | POA: Insufficient documentation

## 2012-02-22 DIAGNOSIS — I10 Essential (primary) hypertension: Secondary | ICD-10-CM | POA: Insufficient documentation

## 2012-02-22 DIAGNOSIS — I252 Old myocardial infarction: Secondary | ICD-10-CM | POA: Insufficient documentation

## 2012-02-22 NOTE — Telephone Encounter (Signed)
Her daughter called to say that after removing some staples, a 3-4 in segment of the incision has opened up and she was concerned that it might get infected.  I recommended that they come to the ER for evaluation and possible wound care.

## 2012-02-22 NOTE — ED Notes (Signed)
Pt had double hernia removal in November, had recheck on yesterday and had some staples removed. pts family reports wound was "open" tonight after attempting to change the dressing. Pt has 2inch opening noted to bottom of incision site.

## 2012-02-23 ENCOUNTER — Encounter (INDEPENDENT_AMBULATORY_CARE_PROVIDER_SITE_OTHER): Payer: Self-pay | Admitting: General Surgery

## 2012-02-23 ENCOUNTER — Ambulatory Visit (INDEPENDENT_AMBULATORY_CARE_PROVIDER_SITE_OTHER): Payer: Medicare Other | Admitting: General Surgery

## 2012-02-23 VITALS — BP 132/84 | HR 80 | Resp 20 | Ht 67.0 in | Wt 327.0 lb

## 2012-02-23 DIAGNOSIS — Z09 Encounter for follow-up examination after completed treatment for conditions other than malignant neoplasm: Secondary | ICD-10-CM

## 2012-02-23 DIAGNOSIS — T148XXA Other injury of unspecified body region, initial encounter: Secondary | ICD-10-CM

## 2012-02-23 MED ORDER — DOXYCYCLINE HYCLATE 100 MG PO TABS: 100.0000 mg | ORAL_TABLET | Freq: Two times a day (BID) | ORAL | Status: DC

## 2012-02-23 NOTE — Progress Notes (Signed)
Subjective:     Patient ID: Julia West, female   DOB: 1956-02-04, 56 y.o.   MRN: [id number]  HPI 55 year old Caucasian female comes in for wound check. She underwent an open incisional hernia repair with a small bowel resection for a strangulated incisional hernia on November 25. She had some of her surgical skin staples removed in the clinic this past Monday. She still has her surgical drains in. Yesterday she noticed that the lower part of her incision had opened up slightly. She went to the emergency room for evaluation. Wet-to-dry dressings were applied to the separated skin incision in the lower midline. When I saw her in the clinic late last week I placed her on some doxycycline for some erythema around her left lower quadrant drain site. She states that she has been doing well otherwise. She denies any fever, chills, nausea, vomiting, diarrhea or constipation.  She states the left lower quadrant drain stopped holding suction last evening. She is still putting out over 80 cc per drain per day  Review of Systems     Objective:   Physical Exam BP 132/84  Pulse 80  Resp 20  Ht 5\' 7"  (1.702 m)  Wt 327 lb (148.326 kg)  BMI 51.22 kg/m2 Alert, no apparent distress Abdomen-obese soft, nontender. Surgical staples still in place. The skin around the umbilicus still ischemic but viable. In the lower midline over her panus there is no cellulitis around the incision. The skin has separated for a length of about 3 inches. The wound tracts fairly deeply superiorly. The fascia appears intact. There is no surrounding induration. The left lower quadrant drain site only has minimal erythema. The drains are now more serosanguineous    Assessment:     Status post open primary repair of strangulated incisional hernia with small bowel resection now with some skin separation in the lower midline    Plan:     The patient was instructed on wet-to-dry dressings to the area has opened up. I'm going to  leave the remaining skin staples in for now. I renewed her doxycycline. We wrote down her wound care instructions. We will try to arrange home health nursing. Followup with me next week for a wound check  Julia West. [name], MD, FACS General, Bariatric, & Minimally Invasive Surgery [facility], [place]

## 2012-02-23 NOTE — Progress Notes (Signed)
The lower 2 inches of her midline wound opened up today and drained some bloody fluid.  Has some slight edema and slight erythema.  Wound probed and some serosanguinous fluid removed.  Wound packed with moist kerlix gauze.  Dressing applied.  Will need wet to dry dressings BID.  She will follow up in the office tomorrow for wound packing and wound care teaching and possible home health if family unable to care for wound.  She has been on abx and this was likely wound infection.

## 2012-02-23 NOTE — Patient Instructions (Addendum)
Keep recording drain output The left drain may not hold suction due to the separation of the skin incision Dressing Care - At least once daily - if not twice Remove outer bandages Remove gauze packing - can moisten with saline if 'stuck' Moisten Kerlix (gauze roll - 2in or 3 in wide) with saline, ring out, and pack into wound - can use a q-tip to help pack - leave small amount on outside to help with later change Cover with dry gauze  Pick up antibiotic at pharmacy

## 2012-02-23 NOTE — ED Provider Notes (Signed)
History     CSN: [id number]  Arrival date & time 02/22/12  2153   First MD Initiated Contact with Patient 02/23/12 0104      Chief Complaint  Patient presents with  . Wound Dehiscence    (Consider location/radiation/quality/duration/timing/severity/associated sxs/prior treatment) HPI 56 year old female with hernia operation in November with large midline incision presents to the emergency apartment with complaint of opening of her wound at the inferior portion. She had clinic visit yesterday and staples every other removed while there. Tonight her daughter was changing the dressing and noted that she had a opening to the inferior aspect. No drainage, no pain to the area. Patient with some redness around drain sites, and is currently on doxycycline. She talked to the on-call surgeon who recommended wet-to-dry dressings and followup in the clinic as scheduled on Friday. Patient was concerned as she cannot see the wound due to her pannus, and came to the emergency department. She denies any fever chills   Past Medical History  Diagnosis Date  . Morbid obesity   . Other and unspecified hyperlipidemia   . Unspecified essential hypertension   . Acute myocardial infarction     with a ruptured plaque in the circumflex in 2003  . Degenerative joint disease   . Internal and external hemorrhoids without complication   . Anxiety   . Coronary artery disease     Past Surgical History  Procedure Date  . Cesarean section     x 4 in remote past  . Cholecystectomy   . Coronary artery bypass graft 2003    by Dr. Tyrone Sage. LIMA to the LAD, free RIMA to the circumflex. Stress perfusion study December 2009 with no high-risk areas of ischemia. She has a well-preserved ejection fraction  . Incisional hernia repair 02/07/2012    Procedure: HERNIA REPAIR INCISIONAL;  Surgeon: [name], MD,FACS;  Location: [facility] OR;  Service: General;;  Open, Primary repair, strangulated Incisional hernia.  . Bowel  resection 02/07/2012    Procedure: SMALL BOWEL RESECTION;  Surgeon: [name], MD,FACS;  Location: [facility] OR;  Service: General;;    Family History  Problem Relation Age of Onset  . Heart attack Mother   . Hypertension Sister     History  Substance Use Topics  . Smoking status: Former Smoker -- 1.0 packs/day for 25 years    Types: Cigarettes    Quit date: 11/17/2011  . Smokeless tobacco: Not on file  . Alcohol Use: No    OB History    Grav Para Term Preterm Abortions TAB SAB Ect Mult Living                  Review of Systems  See History of Present Illness; otherwise all other systems are reviewed and negative  Allergies  Ace inhibitors  Home Medications   Current Outpatient Rx  Name  Route  Sig  Dispense  Refill  . ASPIRIN 81 MG PO TABS   Oral   Take 81 mg by mouth daily.           Marland Kitchen DICLOFENAC SODIUM 75 MG PO TBEC   Oral   Take 75 mg by mouth 2 (two) times daily.           Marland Kitchen DOXYCYCLINE HYCLATE 100 MG PO TABS   Oral   Take 1 tablet (100 mg total) by mouth 2 (two) times daily.   14 tablet   0   . OMEGA-3 FATTY ACIDS 1000 MG PO CAPS  Oral   Take 2 g by mouth daily.           . FUROSEMIDE 20 MG PO TABS   Oral   Take 10 mg by mouth daily.           Marland Kitchen HYDROCODONE-ACETAMINOPHEN 5-325 MG PO TABS   Oral   Take 1 tablet by mouth every 6 (six) hours as needed. For pain         . METOPROLOL TARTRATE 50 MG PO TABS   Oral   Take 50 mg by mouth 2 (two) times daily.           Marland Kitchen ROSUVASTATIN CALCIUM 10 MG PO TABS   Oral   Take 10 mg by mouth daily.           Marland Kitchen VALSARTAN 160 MG PO TABS   Oral   Take 160 mg by mouth daily.         Marland Kitchen ZOLPIDEM TARTRATE 10 MG PO TABS   Oral   Take 10 mg by mouth at bedtime as needed. For sleep           BP 129/65  Pulse 92  Temp 98 F (36.7 C) (Oral)  Resp 20  SpO2 91%  Physical Exam  Nursing note and vitals reviewed. Constitutional: She is oriented to person, place, and time. She appears  well-developed and well-nourished.       Morbidly obese female in no acute distress  HENT:  Head: Normocephalic and atraumatic.  Nose: Nose normal.  Mouth/Throat: Oropharynx is clear and moist.  Eyes: Conjunctivae normal and EOM are normal. Pupils are equal, round, and reactive to light.  Neck: Normal range of motion. Neck supple. No JVD present. No tracheal deviation present. No thyromegaly present.  Cardiovascular: Normal rate, regular rhythm, normal heart sounds and intact distal pulses.  Exam reveals no gallop and no friction rub.   No murmur heard. Pulmonary/Chest: Effort normal and breath sounds normal. No stridor. No respiratory distress. She has no wheezes. She has no rales. She exhibits no tenderness.  Abdominal: Soft. Bowel sounds are normal. She exhibits no distension and no mass. There is no tenderness. There is no rebound and no guarding.       Midline incision noted with staples in place. Patient has 3 cm area of open wound at the inferior portion. Wound very gently probed, does not seem to be deeper than subcutaneous tissues. There is no drainage or purulence noted in the wound base.  No sutures or mesh noted  Musculoskeletal: Normal range of motion. She exhibits no edema and no tenderness.  Lymphadenopathy:    She has no cervical adenopathy.  Neurological: She is alert and oriented to person, place, and time. She exhibits normal muscle tone. Coordination normal.  Skin: Skin is warm and dry. No rash noted. There is erythema (erythema around the wound without induration or crepitus). No pallor.  Psychiatric: She has a normal mood and affect. Her behavior is normal. Judgment and thought content normal.    ED Course  Procedures (including critical care time)  Labs Reviewed - No data to display No results found.   1. Wound dehiscence       MDM  56 year old female with wound dehiscence. Wound does not appear to be into the abdominal cavity. Discuss case with Dr.Layton, who  has seen the patient in the ER and agrees with wet to dry dressings and followup in the clinic        Rhona Leavens  Norlene Campbell, MD 02/23/12 [phone]

## 2012-02-23 NOTE — Addendum Note (Signed)
Addended byLiliana Cline on: 02/23/2012 01:13 PM   Modules accepted: Orders

## 2012-02-24 ENCOUNTER — Telehealth (INDEPENDENT_AMBULATORY_CARE_PROVIDER_SITE_OTHER): Payer: Self-pay | Admitting: General Surgery

## 2012-02-24 ENCOUNTER — Encounter (INDEPENDENT_AMBULATORY_CARE_PROVIDER_SITE_OTHER): Payer: Medicare Other | Admitting: General Surgery

## 2012-02-24 NOTE — Telephone Encounter (Signed)
Patient aware that I did refer to [facility] and it does not matter what office I refer to - it matters where they are traveling and they advised they do not have the staff in the area where she lives in order to send a nurse to her. Aware I had already faxed a referral to the number below and spoke with them directly.

## 2012-02-24 NOTE — Telephone Encounter (Signed)
Message copied by Liliana Cline on Thu Feb 24, 2012  4:11 PM ------      Message from: Debbe Odea R      Created: Thu Feb 24, 2012  4:03 PM      Regarding: referral      Contact: [phone]       Pt cannot afford $75 per trip, out-of-pocket, for [facility]. She called her insurance company and they said [facility] in [place] is considered in her network.  She has called them and they said all they need is the referral to be FAXd to them:  [phone].  bp

## 2012-02-24 NOTE — Telephone Encounter (Signed)
Called patient and made her aware I have tried home health referrals to [facility], [place], [place] and two other companies she got from her insurance. [facility] is not excepting new referrals. [facility] does not take her insurance and [facility] will pay at 50%. The other two companies are for assisted living. Patient made aware of the above. Her daughter is doing dressing changes at the moment. She is calling [facility] to figure out how much 50% would be. Patient aware referral with [facility] so if she would like them to see her she can let them know. She will call me if she needs me.

## 2012-02-25 ENCOUNTER — Telehealth (INDEPENDENT_AMBULATORY_CARE_PROVIDER_SITE_OTHER): Payer: Self-pay | Admitting: General Surgery

## 2012-02-25 NOTE — Telephone Encounter (Signed)
Spoke with patient and she asked me to call the insurance to get approval to have an out of network provider provide home health. I called insurance company at [phone] - they transferred me to multiple departments and no one would help me. Information given to clinical manager to work on [facility] seeing patient.

## 2012-02-25 NOTE — Telephone Encounter (Signed)
Message copied by Liliana Cline on Fri Feb 25, 2012 10:14 AM ------      Message from: Zacarias Pontes      Created: Fri Feb 25, 2012 10:04 AM       Pt needs a call back today,she is having trouble finding a home health care provider in network.Please call her before you leave today per pt request at [phone]

## 2012-02-28 ENCOUNTER — Encounter (INDEPENDENT_AMBULATORY_CARE_PROVIDER_SITE_OTHER): Payer: Medicare Other | Admitting: Surgery

## 2012-03-02 ENCOUNTER — Ambulatory Visit (INDEPENDENT_AMBULATORY_CARE_PROVIDER_SITE_OTHER): Payer: Medicare Other | Admitting: General Surgery

## 2012-03-02 ENCOUNTER — Encounter (INDEPENDENT_AMBULATORY_CARE_PROVIDER_SITE_OTHER): Payer: Self-pay | Admitting: General Surgery

## 2012-03-02 VITALS — BP 102/66 | HR 60 | Resp 25 | Ht 67.0 in | Wt 315.0 lb

## 2012-03-02 DIAGNOSIS — Z09 Encounter for follow-up examination after completed treatment for conditions other than malignant neoplasm: Secondary | ICD-10-CM

## 2012-03-02 MED ORDER — HYDROCODONE-ACETAMINOPHEN 5-325 MG PO TABS: 1.0000 | ORAL_TABLET | Freq: Four times a day (QID) | ORAL | Status: DC | PRN

## 2012-03-02 MED ORDER — ABDOMINAL BINDER/ELASTIC 3XL MISC: 1.0000 [IU] | Freq: Every morning | Status: DC

## 2012-03-02 NOTE — Progress Notes (Signed)
Subjective:     Patient ID: [name], female   DOB: 02-Aug-1955, 56 y.o.   MRN: [id number]  HPI 56 year old Caucasian female comes in for a wound check and postoperative check. She underwent an urgent incisional hernia repair on November 25. She had a strangulated incisional hernia which required a small bowel resection and  Open primary repair. When some of her surgical staples were removed from her midline incision her lower midline incision over her panus separated. She has been having wet-to-dry dressings done twice a day. She still has her surgical drains in place. Drain #1 has been averaging 90 cc then 40 cc and then 120 cc for the past 3 days. Drain #2 has averaged 80 cc and 90 cc for the past several days. Drain fluid is serous fluid. She is almost done with her antibiotic course. She denies any fever, chills, nausea, vomiting, diarrhea or constipation. She does have abdominal pain at night that she does try to sleep on her stomach.  Review of Systems     Objective:   Physical Exam BP 102/66  Pulse 60  Resp 25  Ht 5\' 7"  (1.702 m)  Wt 315 lb (142.883 kg)  BMI 49.34 kg/m2 Alert, no apparent distress Abdomen-soft, obese, nontender. Upper midline incision is well approximated. There is some skin necrosis and ischemia at the umbilicus which is stable. The lower part of the incision is still open. The length of the incision that is open is about 6 cm x 3 cm wide by about 5 cm deep. There is beefy granulation tissue at the base of the wound. The fascia appears intact. The wound does track superiorly subcutaneously. The left lower quadrant drain site has about 1 mm of erythema around it. The right lower quadrant drain site appears intact.    Assessment:     Status post open primary repair of strangulated incisional hernia with small bowel resection    Plan:     The erythema over her lower midline incision is almost gone. I am not going to renew her antibiotics. I am going to remove  the left lower quadrant drain today even though she still has a large amount of drain output. We will keep the right lower part of drain in place. We will remove the remaining part of her surgical skins staples. I explained to her and her daughter that the skin may separate at the umbilicus. If it does so we will start wet to dry dressings in that area. I renewed her prescription for Vicodin. I also gave her a prescription for new abdominal binder. I explained that the left lower quadrant drain site will heal over the next few days but may require daily dressing changes until the drain site is completely sealed up. Followup in 2 weeks  [name]. [name], MD, FACS General, Bariatric, & Minimally Invasive Surgery [facility], [place]

## 2012-03-06 ENCOUNTER — Telehealth (INDEPENDENT_AMBULATORY_CARE_PROVIDER_SITE_OTHER): Payer: Self-pay | Admitting: General Surgery

## 2012-03-06 ENCOUNTER — Encounter (INDEPENDENT_AMBULATORY_CARE_PROVIDER_SITE_OTHER): Payer: Self-pay | Admitting: General Surgery

## 2012-03-06 ENCOUNTER — Ambulatory Visit (INDEPENDENT_AMBULATORY_CARE_PROVIDER_SITE_OTHER): Payer: Medicare Other | Admitting: General Surgery

## 2012-03-06 VITALS — BP 134/66 | HR 60 | Temp 98.3°F | Resp 16 | Ht 67.0 in | Wt 316.0 lb

## 2012-03-06 DIAGNOSIS — S31109A Unspecified open wound of abdominal wall, unspecified quadrant without penetration into peritoneal cavity, initial encounter: Secondary | ICD-10-CM | POA: Insufficient documentation

## 2012-03-06 DIAGNOSIS — K439 Ventral hernia without obstruction or gangrene: Secondary | ICD-10-CM

## 2012-03-06 NOTE — Telephone Encounter (Signed)
Pt called to report an area of her wound is oozing pus; has very foul odor.  Denies fever.

## 2012-03-06 NOTE — Progress Notes (Signed)
Subjective:     Patient ID: [name], female   DOB: 1955/11/15, 56 y.o.   MRN: [id number]  HPI The patient is a 56 year old white female who is about one month status post emergent ventral hernia repair. Since her last visit with Dr. [name] she has had more opening of the superior portion of the wound. There is some smell to the drainage. She has had low-grade fevers of 99 home. Her appetite is good and her bowels are working normally.  Review of Systems     Objective:   Physical Exam On exam the lower portion of the wound is opened is clean with good granulation tissue and no tunneling. The midportion of the incision is now open. We probed it with a cotton swab and packed it with Kerlix gauze. This wound does communicate with the area of the drain and the drain no longer keeps a seal. The drain was removed without difficulty.    Assessment:     One month status post emergent ventral hernia repair now with an open abdominal wound.    Plan:     At this point the family will change the dressing and repack the wound twice a day. I think the patient started getting in the shower and washing and irrigating the wound. She will come back to see Dr. [name] in the next week or 2

## 2012-03-06 NOTE — Patient Instructions (Signed)
Change dressing with kerlix twice a day Remove dressing and start showering tomorrow

## 2012-03-09 ENCOUNTER — Telehealth (INDEPENDENT_AMBULATORY_CARE_PROVIDER_SITE_OTHER): Payer: Self-pay | Admitting: General Surgery

## 2012-03-09 NOTE — Telephone Encounter (Signed)
Patient's daughter called back wanting a wound vac ordered now.  I made her aware I would need an order from Dr [name] and he is not available until Monday. She wanted me to have another MD order this. I made her aware this decision needed to be made by the MD's that are caring for her. She did not want to accept this. She wanted the wound vac now. She wanted another MD to look at her chart and make the decision. She is concerned the wound is opened more. They wanted seen this week. I made an appt for her tomorrow with Dr Derrell Lolling at 2:45 pm.

## 2012-03-09 NOTE — Telephone Encounter (Signed)
Patient's daughter, Rockne Menghini, called to request a referral to a wound care specialist. I made her aware I will need an okay from Dr [name] for this referral. Patient had another call and will call back.

## 2012-03-09 NOTE — Telephone Encounter (Signed)
Stanton Kidney, nurse with [facility], called with wound update and request for wound vac.  The wound is not open at two sites and is tunneling.  The top of the incision is 9 cm deep and the bottom is 4.5 cm deep.  There is tunneling up and below.  The home health nurse is recommending a wound vac to expedite healing.  Pt's next appt is 03/17/12.  Please advise.

## 2012-03-10 ENCOUNTER — Encounter (INDEPENDENT_AMBULATORY_CARE_PROVIDER_SITE_OTHER): Payer: Medicare Other | Admitting: General Surgery

## 2012-03-13 ENCOUNTER — Telehealth (INDEPENDENT_AMBULATORY_CARE_PROVIDER_SITE_OTHER): Payer: Self-pay

## 2012-03-13 ENCOUNTER — Other Ambulatory Visit (INDEPENDENT_AMBULATORY_CARE_PROVIDER_SITE_OTHER): Payer: Self-pay | Admitting: General Surgery

## 2012-03-13 NOTE — Telephone Encounter (Signed)
LMOM for Julia West with [facility] to call me back. To make her aware Dr [name] is approving the wound vac and to make sure foam is packed deep to the base of the wound. Awaiting call back to see if they order wound vac of if I do.

## 2012-03-13 NOTE — Telephone Encounter (Signed)
Pt called wanting to know if Lesly Rubenstein has reviewed with Dr [name] about a wound vac or referral to wd center. I advised her that Dr [name] will be back in today and notes are in system re: this. I will send msg to [facility] as reminder.

## 2012-03-13 NOTE — Telephone Encounter (Signed)
Hydrocodone 5/325 #40 approved per Dr [name] and called to [facility].

## 2012-03-13 NOTE — Telephone Encounter (Signed)
[name], with [facility], returned call and given message to begin wound vac.  She stated there is already a wound vac at the pt's home and she is on the way there now.

## 2012-03-16 ENCOUNTER — Ambulatory Visit (INDEPENDENT_AMBULATORY_CARE_PROVIDER_SITE_OTHER): Payer: Medicare Other | Admitting: General Surgery

## 2012-03-16 ENCOUNTER — Encounter (INDEPENDENT_AMBULATORY_CARE_PROVIDER_SITE_OTHER): Payer: Self-pay | Admitting: General Surgery

## 2012-03-16 VITALS — BP 128/76 | HR 68 | Temp 98.1°F | Resp 16 | Ht 67.0 in | Wt 310.6 lb

## 2012-03-16 DIAGNOSIS — S31109A Unspecified open wound of abdominal wall, unspecified quadrant without penetration into peritoneal cavity, initial encounter: Secondary | ICD-10-CM

## 2012-03-16 NOTE — Progress Notes (Signed)
Subjective:     Patient ID: Julia West, female   DOB: 28-Jun-1955, 57 y.o.   MRN: [id number]  HPI 57 year old morbidly obese Caucasian female comes in today for a wound check. I last saw her in December.  The lower part of her midline incision had opened and was undergoing wet to dry dressings. Apparently around December 23 a larger portion of her upper midline incision opened. She saw one of my partners in the office on December 23 and her last subcutaneous drain was removed. She underwent wet-to-dry packing to that area. Since she was last seen she has seen the wound care physician in [place], Dr Julia West. They state that he debrided some of the wound and ordered a wound VAC. She states that the wound VAC was applied this past Monday and when the nurse change the wound VAC yesterday she noticed a portion of the wound was black. She denies any fever, chills, nausea, vomiting, diarrhea or constipation. She reports a decent appetite  Review of Systems     Objective:   Physical Exam BP 128/76  Pulse 68  Temp 98.1 F (36.7 C) (Temporal)  Resp 16  Ht 5\' 7"  (1.702 m)  Wt 310 lb 9.6 oz (140.887 kg)  BMI 48.65 kg/m2 Morbidly obese Caucasian female in no apparent distress Lungs-clear to auscultation Cardiac-regular rate and rhythm Abdomen-soft, morbidly obese; midline incision has opened up. She has 2 openings in her incision - a large upper portion that measures about 14 cm long by 7 cm wide by about 8 cm deep. There is beefy granulation tissue at the wound base. The fascia appears intact. Sutures are visible. The subcutaneous fat on both sides of the incision has superficial necrosis-it is black and gray. The lower portion of the midline incision is open; however, it is viable. There is no necrosis. There is beefy red granulation tissue    Assessment:     Status post emergent repair of strangulated ventral incisional hernia with small bowel resection and open primary hernia repair Open  abdominal wound    Plan:     She appears to have fat necrosis on both sides of the upper portion of the midline wound. we gently debrided both sides with scissors as well as scalpel today. The majority of all necrotic tissue was excised and debridement. She tolerated that well. The lower portion of the wound looks great. I answered all of her and her daughter's questions. She was encouraged to continue with a high protein diet. We packed the wound with a wet to dry gauze today. I cautioned them that she may have ongoing fat necrosis that may require additional bedside debridement. I think she can resume a wound VAC in the next day or so with a white sponge at the base followed by a black sponge. I reiterated to her that she is at increased risk for recurrence now. I also told them if they wanted to continue to see the wound care physician in [place] I had no objections. Followup with me in 10-12 days for another wound check  Julia West. Julia Campanile, MD, FACS General, Bariatric, & Minimally Invasive Surgery [facility], [place]

## 2012-03-16 NOTE — Patient Instructions (Signed)
Can resume wound vac on Friday. White sponge to base of wound then black sponge.

## 2012-03-17 ENCOUNTER — Encounter (INDEPENDENT_AMBULATORY_CARE_PROVIDER_SITE_OTHER): Payer: Medicare Other | Admitting: General Surgery

## 2012-03-17 ENCOUNTER — Telehealth (INDEPENDENT_AMBULATORY_CARE_PROVIDER_SITE_OTHER): Payer: Self-pay

## 2012-03-17 NOTE — Telephone Encounter (Signed)
Julia West at [facility] called to verify we care instructions. Notes from 03-16-12 visit with Dr [name] reviewed with her. She will order white sponge and restart wound vac once sponge arrives. She will continue saline wet to dry until sponge comes in.

## 2012-03-20 ENCOUNTER — Telehealth (INDEPENDENT_AMBULATORY_CARE_PROVIDER_SITE_OTHER): Payer: Self-pay | Admitting: General Surgery

## 2012-03-20 NOTE — Telephone Encounter (Signed)
Message copied by Liliana Cline on Mon Mar 20, 2012  9:34 AM ------      Message from: Zacarias Pontes      Created: Mon Mar 20, 2012  8:42 AM      Contact: [phone]       Pt has questions on ordering gauze and misc supplies.please return her call

## 2012-03-20 NOTE — Telephone Encounter (Signed)
Spoke with patient and she wanted to know how [employer] ordering worked -because she said she was told she could only order so much gauze per the company. I made her aware that we get the order and Dr [name] signs it and sends it back. That is all we do on our end and Dr [name] has signed all his paperwork. Made her aware if she is having trouble her home health nurse may be a better resource because they probably do this more. She will call with any other questions and keep appt on Thursday.

## 2012-03-23 ENCOUNTER — Ambulatory Visit (INDEPENDENT_AMBULATORY_CARE_PROVIDER_SITE_OTHER): Payer: Medicare Other | Admitting: General Surgery

## 2012-03-23 ENCOUNTER — Other Ambulatory Visit (INDEPENDENT_AMBULATORY_CARE_PROVIDER_SITE_OTHER): Payer: Self-pay | Admitting: General Surgery

## 2012-03-23 ENCOUNTER — Encounter (INDEPENDENT_AMBULATORY_CARE_PROVIDER_SITE_OTHER): Payer: Self-pay | Admitting: General Surgery

## 2012-03-23 VITALS — BP 110/70 | HR 60 | Temp 98.0°F | Resp 24 | Wt 299.0 lb

## 2012-03-23 DIAGNOSIS — S31109A Unspecified open wound of abdominal wall, unspecified quadrant without penetration into peritoneal cavity, initial encounter: Secondary | ICD-10-CM

## 2012-03-23 NOTE — Patient Instructions (Signed)
Can resume wound vac with white sponge base then black sponge base Friday

## 2012-03-23 NOTE — Progress Notes (Signed)
Subjective:     Patient ID: Julia West, female   DOB: 04-13-1955, 57 y.o.   MRN: [id number]  HPI 57 year old morbidly obese Caucasian female comes in today for a wound check. I last saw her last week.  The lower part of her midline incision had opened and was undergoing wet to dry dressings. Apparently around December 23 a larger portion of her upper midline incision opened. Last week, we debrided necrotic fat from both the left and right side of the upper midline incision. Her last vac change was yesterday. She has a white sponge base then black foam. She denies any fever, chills, nausea, vomiting, diarrhea or constipation. She reports a decent appetite  Review of Systems     Objective:   Physical Exam BP 110/70  Pulse 60  Temp 98 F (36.7 C) (Oral)  Resp 24  Wt 299 lb (135.626 kg) Morbidly obese Caucasian female in no apparent distress Abdomen-soft, morbidly obese; midline incision open. She has 2 openings in her incision - a large upper portion that measures about 14 cm long by 7 cm wide by about 7 cm deep. - a litter smaller- There is beefy granulation tissue at the wound base. The fascia appears intact. Sutures are visible. The subcutaneous fat on both sides of the incision has superficial necrosis-it is black and gray. The lower portion of the midline incision is open; however, it is viable. There is no necrosis. There is beefy red granulation tissue and smaller than last week    Assessment:     Status post emergent repair of strangulated ventral incisional hernia with small bowel resection and open primary hernia repair Open abdominal wound with ongoing fat necrosis    Plan:     She appears to have ongoing fat necrosis on both sides of the upper portion of the midline wound. we gently debrided both sides with scissors as well as scalpel today. The majority of all necrotic tissue was excised and debridement. She tolerated that well. The lower portion of the wound looks great.  I answered all of her and her daughter's questions. She was encouraged to continue with a high protein diet. We packed the wound with a wet to dry gauze today. I cautioned them that she may have ongoing fat necrosis that may require additional bedside debridement. I think she can resume a wound VAC in the next day or so with a white sponge at the base followed by a black sponge. I reiterated to her that she is at increased risk for recurrence now. I also told them if they wanted to continue to see the wound care physician in [place] I had no objections. Followup with me in 10-12 days for another wound check. If has additional ongoing fat necrosis will stop wound vac and go back to w-d dressings. The wound vac maybe to "powerful" for the subcutaneous fat  Julia West. Julia Campanile, MD, FACS General, Bariatric, & Minimally Invasive Surgery [facility], [place]

## 2012-03-27 ENCOUNTER — Telehealth (INDEPENDENT_AMBULATORY_CARE_PROVIDER_SITE_OTHER): Payer: Self-pay

## 2012-03-27 NOTE — Telephone Encounter (Signed)
The nurse called to report dark tissue in the wound.  It is not black.  She wants to know if she should put the wound vac back on or do a wet to dry dressing change.  I spoke to Dr [name] and Lesly Rubenstein and they advised apply wound vac and then they will see her Wednesday.

## 2012-03-29 ENCOUNTER — Encounter (INDEPENDENT_AMBULATORY_CARE_PROVIDER_SITE_OTHER): Payer: Self-pay | Admitting: General Surgery

## 2012-03-29 ENCOUNTER — Ambulatory Visit (INDEPENDENT_AMBULATORY_CARE_PROVIDER_SITE_OTHER): Payer: Medicare Other | Admitting: General Surgery

## 2012-03-29 ENCOUNTER — Telehealth (INDEPENDENT_AMBULATORY_CARE_PROVIDER_SITE_OTHER): Payer: Self-pay | Admitting: General Surgery

## 2012-03-29 VITALS — BP 128/70 | HR 64 | Temp 97.0°F | Resp 24 | Ht 67.0 in | Wt 297.6 lb

## 2012-03-29 DIAGNOSIS — S31109A Unspecified open wound of abdominal wall, unspecified quadrant without penetration into peritoneal cavity, initial encounter: Secondary | ICD-10-CM

## 2012-03-29 DIAGNOSIS — T8189XA Other complications of procedures, not elsewhere classified, initial encounter: Secondary | ICD-10-CM

## 2012-03-29 NOTE — Patient Instructions (Signed)
We will refer to Dr Kelly Splinter for a 2nd opinion regarding the wound management

## 2012-03-29 NOTE — Progress Notes (Signed)
Subjective:     Patient ID: Julia West, female   DOB: 15-Jun-1955, 57 y.o.   MRN: [id number]  HPI 57 year old morbidly obese Caucasian female comes in today for a wound check. I last saw her last week.  The lower part of her midline incision had opened and was undergoing wet to dry dressings. Apparently around December 23 a larger portion of her upper midline incision opened. Last week, we debrided necrotic fat from both the left and right side of the upper midline incision. Her last vac change was yesterday. She has a white sponge base then black foam. She denies any fever, chills, nausea, vomiting, diarrhea or constipation. She reports a good appetite  Review of Systems     Objective:   Physical Exam BP 128/70  Pulse 64  Temp 97 F (36.1 C) (Temporal)  Resp 24  Ht 5\' 7"  (1.702 m)  Wt 297 lb 9.6 oz (134.99 kg)  BMI 46.61 kg/m2 Morbidly obese Caucasian female in no apparent distress Abdomen-soft, morbidly obese; midline incision open. She has 2 openings in her incision - a large upper portion that measures about 13 cm long by 9.5 cm wide by about 8 cm deep. -essentially no change-there appears more skin non-viability on the left side of at least 1.5cm. There is beefy granulation tissue at the wound base. The fascia appears intact. Sutures are visible. The subcutaneous fat on both sides of the incision has ongoing necrosis-it is black and gray. The lower portion of the midline incision is open; however, it is viable. There is no necrosis. There is beefy red granulation tissue and smaller than last week 2.5 x 2.5 x 2.5cm.     Assessment:     Status post emergent repair of strangulated ventral incisional hernia with small bowel resection and open primary hernia repair 02/07/12 Open abdominal wound with ongoing fat necrosis    Plan:     She appears to have ongoing fat necrosis on both sides of the upper portion of the midline wound. we gently debrided both sides of subcu fat and skin  with scissors as well as scalpel today. She tolerated that well. The lower portion of the wound looks great. Marland Kitchen She was encouraged to continue with a high protein diet. We packed the wound with a wet to dry gauze today.   I explained that I discussed her case with Dr. Kelly Splinter last week. We just are not making any headway on her open abdominal wound. I believe that the patient will require going back to operating room for further debridement of her skin and subcutaneous tissue until we get that back to viable tissue. I explained this to the patient and her sister. I explained that the wound is not healing because she does not have adequate blood supply to the skin and subcutaneous tissue. Dr. Kelly Splinter has agreed to see the patient in consultation. We will try to get the patient into Dr. Leonie Green clinic for next Monday.  [name]. [name], MD, FACS General, Bariatric, & Minimally Invasive Surgery [facility], [place]

## 2012-03-29 NOTE — Telephone Encounter (Signed)
Fax sent to [facility] making them aware to d/c wound vac and start wet to dry daily dressing changes until further notice. Confirmation received.

## 2012-03-30 ENCOUNTER — Telehealth (INDEPENDENT_AMBULATORY_CARE_PROVIDER_SITE_OTHER): Payer: Self-pay | Admitting: General Surgery

## 2012-03-30 NOTE — Telephone Encounter (Signed)
Dr [name] out of the office. Dr Maisie Fus signed paperwork and it has been faxed to [facility] at [phone]. Confirmation received. Supply quantity pre-population by [employer]/insurance.

## 2012-03-30 NOTE — Telephone Encounter (Signed)
Message copied by Liliana Cline on Thu Mar 30, 2012  1:42 PM ------      Message from: Marnette Burgess      Created: Thu Mar 30, 2012  8:48 AM      Contact: [phone]       Patient called, she is almost out of supplies, called her supplier to place an order, the supplier will send an authorization today.  Per patient it is urgent to have that signed today or she will have to go out and buy some supplies and she also needs an increase in quantity.  If you have any questions please call.(pt of Dr. Migdalia Dk)

## 2012-04-03 ENCOUNTER — Encounter (HOSPITAL_BASED_OUTPATIENT_CLINIC_OR_DEPARTMENT_OTHER): Payer: Medicare Other | Attending: Plastic Surgery

## 2012-04-03 ENCOUNTER — Other Ambulatory Visit (INDEPENDENT_AMBULATORY_CARE_PROVIDER_SITE_OTHER): Payer: Self-pay | Admitting: General Surgery

## 2012-04-03 ENCOUNTER — Telehealth (INDEPENDENT_AMBULATORY_CARE_PROVIDER_SITE_OTHER): Payer: Self-pay | Admitting: General Surgery

## 2012-04-03 DIAGNOSIS — I10 Essential (primary) hypertension: Secondary | ICD-10-CM | POA: Insufficient documentation

## 2012-04-03 DIAGNOSIS — L98499 Non-pressure chronic ulcer of skin of other sites with unspecified severity: Secondary | ICD-10-CM | POA: Insufficient documentation

## 2012-04-03 DIAGNOSIS — T8189XA Other complications of procedures, not elsewhere classified, initial encounter: Secondary | ICD-10-CM | POA: Insufficient documentation

## 2012-04-03 DIAGNOSIS — Z87891 Personal history of nicotine dependence: Secondary | ICD-10-CM | POA: Insufficient documentation

## 2012-04-03 DIAGNOSIS — Z79899 Other long term (current) drug therapy: Secondary | ICD-10-CM | POA: Insufficient documentation

## 2012-04-03 DIAGNOSIS — Z7982 Long term (current) use of aspirin: Secondary | ICD-10-CM | POA: Insufficient documentation

## 2012-04-03 DIAGNOSIS — Y838 Other surgical procedures as the cause of abnormal reaction of the patient, or of later complication, without mention of misadventure at the time of the procedure: Secondary | ICD-10-CM | POA: Insufficient documentation

## 2012-04-03 DIAGNOSIS — I2510 Atherosclerotic heart disease of native coronary artery without angina pectoris: Secondary | ICD-10-CM | POA: Insufficient documentation

## 2012-04-03 DIAGNOSIS — E785 Hyperlipidemia, unspecified: Secondary | ICD-10-CM | POA: Insufficient documentation

## 2012-04-03 DIAGNOSIS — I252 Old myocardial infarction: Secondary | ICD-10-CM | POA: Insufficient documentation

## 2012-04-03 NOTE — Telephone Encounter (Signed)
Pt called in wanting a refill on her hydrocodone-acetaminophen 5-325 mg #40 1-2 tablet Q4-6H prn.  I after paging Dr. [name] he gave the okay on this and that it was sent to her pharmacy ([facility] [place], [place] [phone]).

## 2012-04-03 NOTE — Telephone Encounter (Signed)
Pt called in wanting another Rx for hydrocodone-acetaminophen 5-325 mg.  She explained that she is still having pain and discomfort but that she did go see the plastic surgeon for her wound.  I informed her that I would pass this message along to Dr. [name] and that if he gave the okay that I would [facility] [place] [place] [phone].

## 2012-04-03 NOTE — Progress Notes (Signed)
[facility]  NAME:  Julia West, Julia West NO.:  [id number]  MEDICAL RECORD NO.:  [id number]      DATE OF BIRTH:  [birth year]  PHYSICIAN:  Wayland Denis, DO       VISIT DATE:  04/03/2012                                  OFFICE VISIT   The patient is a 57 year old female, who is here for evaluation of a very serious large nonhealing surgical wound of her abdomen.  She has had multiple debridements in the past and it does not seem to be getting better.  The base is granulating, the sizes are quite a bit of necrotic fat.  MEDICATIONS:  Include aspirin, diclofenac, fish oil, furosemide, hydrocodone, metoprolol, Crestor, Diovan, Ambien.  She is allergic to ACE inhibitors.  PAST MEDICAL HISTORY:  Morbid obesity, lymphedema, angina, MI, hypertension, miscarriage, and hyperlipidemia.  SURGERY:  Cholecystectomy, tonsillectomy, C-section, coronary bypass and repair of strangulated incisional hernia for which this was created.  SOCIAL HISTORY:  She lives at home and has excellent support from her family, 2 daughters.  REVIEW OF SYSTEMS:  Otherwise negative.  PHYSICAL EXAMINATION:  GENERAL:  She is alert, oriented, cooperative, not in any acute distress.  She is pleasant. HEENT:  Pupils are equal.  Extraocular muscles are intact.  No cervical lymphadenopathy. CHEST:  Breathing is unlabored. HEART:  Regular. ABDOMEN:  Soft.  Unable to appreciate any organomegaly due to the size. The wound sizes are noted in the note with necrotic fat around, it does not appear to be infected and her abdominal wall seems to have been granulated over.  RECOMMEND:  Pre-albumin, hemoglobin A1c, and evaluate for protein status.  Continue with the wet to dry and set her up for surgery for irrigation and debridement with possible ACell and VAC placement.     Wayland Denis, DO     CS/MEDQ  D:  04/03/2012  T:  04/03/2012  Job:  [id number]

## 2012-04-11 NOTE — Progress Notes (Signed)
[facility]  NAME:  Julia West, Julia West NO.:  [id number]  MEDICAL RECORD NO.:  [id number]      DATE OF BIRTH:  10-31-1955  PHYSICIAN:  Wayland Denis, DO       VISIT DATE:  04/10/2012                                  OFFICE VISIT   The patient is a 57 year old female, who is here for followup on her abdominal ulcer.  She has been using normal saline, wet to dry.  There is still some necrotic tissue, but overall is looking better.  We are still waiting to get her to the operating room and have ordered prealbumin that was not done, so we are going to order that again today.  On exam, she is alert, oriented, cooperative, not in any acute distress. She is pleasant.  Her pupils are equal.  Her extraocular muscles are intact.  The abdomen does not appear to be infected, but does have some necrotic tissue with slight odor to be expected for the size and the necrosis. The lower wound is actually healing and has granulation tissue without any fibrous or necrotic tissue.  We also talked to her about vitamin C, zinc, multivitamin, protein.  She can shower and continue with the wet- to-dry dressings.  We will take her to the OR with plans for debridement, ACell and VAC placement.     Wayland Denis, DO     CS/MEDQ  D:  04/10/2012  T:  04/11/2012  Job:  [id number]

## 2012-04-11 NOTE — Progress Notes (Signed)
[facility]  NAME:  RUSTY, GLODOWSKI NO.:  [id number]  MEDICAL RECORD NO.:  [id number]      DATE OF BIRTH:  20-Feb-1956  PHYSICIAN:  Wayland Denis, DO       VISIT DATE:  04/10/2012                                  OFFICE VISIT   The patient is a 57 year old female with massive abdominal ulcer, post surgical with necrosis. She has been using wet-to-dry it with normal saline.  She is improving actually with some improvement in the granulation tissue.  Her medications and social history is otherwise unchanged.  On exam, she is alert and oriented, cooperative, not in any acute distress.  She is pleasant.  Pupils are equal.  Extraocular muscles are intact.  She has a lot of questions which were answered.  There is some necrotic tissue in the larger wound.  The smaller wound actually looks good and is granulating.  We will plan to take her to the OR.  We are still waiting on a pre-albumin.  She should continue with vitamins C, zinc and multivitamin and continue with protein.  We will also continue with the wet-to-dry dressings and she can shower.     Wayland Denis, DO     CS/MEDQ  D:  04/10/2012  T:  04/10/2012  Job:  [id number]

## 2012-04-12 ENCOUNTER — Encounter (HOSPITAL_BASED_OUTPATIENT_CLINIC_OR_DEPARTMENT_OTHER): Payer: Self-pay | Admitting: *Deleted

## 2012-04-12 ENCOUNTER — Other Ambulatory Visit: Payer: Self-pay | Admitting: Plastic Surgery

## 2012-04-12 ENCOUNTER — Other Ambulatory Visit (INDEPENDENT_AMBULATORY_CARE_PROVIDER_SITE_OTHER): Payer: Self-pay | Admitting: General Surgery

## 2012-04-12 ENCOUNTER — Telehealth (INDEPENDENT_AMBULATORY_CARE_PROVIDER_SITE_OTHER): Payer: Self-pay

## 2012-04-12 DIAGNOSIS — L98499 Non-pressure chronic ulcer of skin of other sites with unspecified severity: Secondary | ICD-10-CM

## 2012-04-12 MED ORDER — HYDROCODONE-ACETAMINOPHEN 5-325 MG PO TABS: 1.0000 | ORAL_TABLET | Freq: Four times a day (QID) | ORAL | Status: DC | PRN

## 2012-04-12 NOTE — Telephone Encounter (Signed)
Ok to refill 

## 2012-04-12 NOTE — Telephone Encounter (Signed)
Ok to refill pain med - protocol

## 2012-04-12 NOTE — Telephone Encounter (Signed)
Patient called in wanting refill of norco 5/325. She is set for surgery with [facility] on 2/3. [facility] will not give pain medicine so she is calling to see if [name] would refill it. Please advise.Marland Kitchen

## 2012-04-12 NOTE — Telephone Encounter (Signed)
Norco 5/325 #40 with [facility]

## 2012-04-12 NOTE — Addendum Note (Signed)
Addended byLiliana Cline on: 04/12/2012 01:17 PM   Modules accepted: Orders

## 2012-04-12 NOTE — Telephone Encounter (Signed)
Yes

## 2012-04-12 NOTE — H&P (Signed)
Julia West is an 56 y.o. female.   Chief Complaint: Large abdominal ulcer HPI: The patient is a 56 yrs old wf here for treatment of her large abdominal ulcer.  She had a hernia that was treated and underwent a laparotomy.  Post operatively she had loss of a large area of the periumbilical area that is full thickness.  She underwent debridement in the last few weeks.  She has remaining necrotic tissue in the abdominal wall.  The base of he ulcer has granulating tissue.  Past Medical History  Diagnosis Date  . Morbid obesity   . Other and unspecified hyperlipidemia   . Unspecified essential hypertension   . Acute myocardial infarction     with a ruptured plaque in the circumflex in 2003  . Degenerative joint disease   . Internal and external hemorrhoids without complication   . Anxiety   . Coronary artery disease   . Complication of anesthesia     states low O2 sats post-op 11/13  . Skin lesion     forehead    Past Surgical History  Procedure Date  . Cesarean section     x 4 in remote past  . Cholecystectomy   . Coronary artery bypass graft 2003    by Dr. Gerhardt. LIMA to the LAD, free RIMA to the circumflex. Stress perfusion study December 2009 with no high-risk areas of ischemia. She has a well-preserved ejection fraction  . Incisional hernia repair 02/07/2012    Procedure: HERNIA REPAIR INCISIONAL;  Surgeon: Eric M Wilson, MD,FACS;  Location: [facility] OR;  Service: General;;  Open, Primary repair, strangulated Incisional hernia.  . Bowel resection 02/07/2012    Procedure: SMALL BOWEL RESECTION;  Surgeon: Eric M Wilson, MD,FACS;  Location: [facility] OR;  Service: General;;  . Application of wound vac     Family History  Problem Relation Age of Onset  . Heart attack Mother   . Hypertension Sister    Social History:  reports that she quit smoking about 4 months ago. Her smoking use included Cigarettes. She has a 25 pack-year smoking history. She does not have any smokeless tobacco  history on file. She reports that she does not drink alcohol or use illicit drugs.  Allergies:  Allergies  Allergen Reactions  . Ace Inhibitors Cough     (Not in a hospital admission)  No results found for this or any previous visit (from the past 48 hour(s)). No results found.  Review of Systems  Constitutional: Negative.   HENT: Negative.   Eyes: Negative.   Respiratory: Negative.   Cardiovascular: Negative.   Gastrointestinal: Negative.   Genitourinary: Negative.   Musculoskeletal: Negative.   Skin: Negative.   Neurological: Negative.   Psychiatric/Behavioral: Negative.     There were no vitals taken for this visit. Physical Exam  Constitutional: She appears well-developed and well-nourished.  HENT:  Head: Normocephalic and atraumatic.  Eyes: Conjunctivae normal and EOM are normal. Pupils are equal, round, and reactive to light.  Neck: Normal range of motion.  Cardiovascular: Normal rate.   Respiratory: Effort normal. No respiratory distress. She has no wheezes.  GI: Soft. There is tenderness.    Musculoskeletal: Normal range of motion.  Neurological: She is alert.  Skin: Skin is warm.  Psychiatric: She has a normal mood and affect. Her behavior is normal. Judgment and thought content normal.     Assessment/Plan Large abdominal ulcer - plan for excision and debridement of abdominal ulcer with placement of Acell and the   VAC. Risks and complications were discussed.  SANGER,CLAIRE 04/12/2012, 4:19 PM    

## 2012-04-12 NOTE — Progress Notes (Signed)
Pt instructed npo p mn  2/2 x metoprolol w sip of water.  To [facility] 2/3 @ 1115.  Needs istat on arrival.  ekg cxr, myoview in epic.

## 2012-04-17 ENCOUNTER — Encounter (HOSPITAL_BASED_OUTPATIENT_CLINIC_OR_DEPARTMENT_OTHER): Payer: Self-pay | Admitting: Anesthesiology

## 2012-04-17 ENCOUNTER — Ambulatory Visit (HOSPITAL_BASED_OUTPATIENT_CLINIC_OR_DEPARTMENT_OTHER)
Admission: RE | Admit: 2012-04-17 | Discharge: 2012-04-17 | Disposition: A | Discharge status: Home / Self Care | Payer: Medicare Other | Source: Ambulatory Visit | Attending: Plastic Surgery | Admitting: Plastic Surgery

## 2012-04-17 ENCOUNTER — Ambulatory Visit (HOSPITAL_BASED_OUTPATIENT_CLINIC_OR_DEPARTMENT_OTHER): Payer: Medicare Other | Admitting: Anesthesiology

## 2012-04-17 ENCOUNTER — Encounter (HOSPITAL_BASED_OUTPATIENT_CLINIC_OR_DEPARTMENT_OTHER)
Admission: RE | Disposition: A | Discharge status: Home / Self Care | Payer: Self-pay | Source: Ambulatory Visit | Attending: Plastic Surgery

## 2012-04-17 ENCOUNTER — Encounter (HOSPITAL_BASED_OUTPATIENT_CLINIC_OR_DEPARTMENT_OTHER): Payer: Self-pay | Admitting: *Deleted

## 2012-04-17 ENCOUNTER — Encounter (HOSPITAL_BASED_OUTPATIENT_CLINIC_OR_DEPARTMENT_OTHER): Payer: Medicare Other | Attending: Plastic Surgery

## 2012-04-17 ENCOUNTER — Encounter (HOSPITAL_BASED_OUTPATIENT_CLINIC_OR_DEPARTMENT_OTHER): Payer: Self-pay | Admitting: Plastic Surgery

## 2012-04-17 DIAGNOSIS — I252 Old myocardial infarction: Secondary | ICD-10-CM | POA: Insufficient documentation

## 2012-04-17 DIAGNOSIS — I10 Essential (primary) hypertension: Secondary | ICD-10-CM | POA: Insufficient documentation

## 2012-04-17 DIAGNOSIS — D1801 Hemangioma of skin and subcutaneous tissue: Secondary | ICD-10-CM | POA: Insufficient documentation

## 2012-04-17 DIAGNOSIS — I2510 Atherosclerotic heart disease of native coronary artery without angina pectoris: Secondary | ICD-10-CM | POA: Insufficient documentation

## 2012-04-17 DIAGNOSIS — Z951 Presence of aortocoronary bypass graft: Secondary | ICD-10-CM | POA: Insufficient documentation

## 2012-04-17 DIAGNOSIS — L98499 Non-pressure chronic ulcer of skin of other sites with unspecified severity: Secondary | ICD-10-CM | POA: Insufficient documentation

## 2012-04-17 DIAGNOSIS — L989 Disorder of the skin and subcutaneous tissue, unspecified: Secondary | ICD-10-CM | POA: Insufficient documentation

## 2012-04-17 HISTORY — PX: INCISION AND DRAINAGE OF WOUND: SHX1803

## 2012-04-17 HISTORY — DX: Adverse effect of unspecified anesthetic, initial encounter: T41.45XA

## 2012-04-17 HISTORY — PX: LESION REMOVAL: SHX5196

## 2012-04-17 HISTORY — DX: Other complications of anesthesia, initial encounter: T88.59XA

## 2012-04-17 LAB — POCT I-STAT 4, (NA,K, GLUC, HGB,HCT)
HCT: 38 % (ref 36.0–46.0)
Hemoglobin: 12.9 g/dL (ref 12.0–15.0)

## 2012-04-17 SURGERY — IRRIGATION AND DEBRIDEMENT WOUND
Anesthesia: General | Site: Face

## 2012-04-17 MED ORDER — LACTATED RINGERS IV SOLN
INTRAVENOUS | Status: DC
  Administered 2012-04-17 (×4): via INTRAVENOUS
  Filled 2012-04-17: qty 1000

## 2012-04-17 MED ORDER — LACTATED RINGERS IV SOLN
INTRAVENOUS | Status: DC
  Filled 2012-04-17: qty 1000

## 2012-04-17 MED ORDER — LIDOCAINE HCL (CARDIAC) 20 MG/ML IV SOLN
INTRAVENOUS | Status: DC | PRN
  Administered 2012-04-17: 80 mg via INTRAVENOUS

## 2012-04-17 MED ORDER — DEXTROSE 5 % IV SOLN
3.0000 g | INTRAVENOUS | Status: AC
  Administered 2012-04-17: 3 g via INTRAVENOUS
  Filled 2012-04-17: qty 3000

## 2012-04-17 MED ORDER — MIDAZOLAM HCL 5 MG/5ML IJ SOLN
INTRAMUSCULAR | Status: DC | PRN
  Administered 2012-04-17: 0.5 mg via INTRAVENOUS

## 2012-04-17 MED ORDER — CEFAZOLIN SODIUM-DEXTROSE 2-3 GM-% IV SOLR
2.0000 g | INTRAVENOUS | Status: DC
  Filled 2012-04-17: qty 50

## 2012-04-17 MED ORDER — EPHEDRINE SULFATE 50 MG/ML IJ SOLN
INTRAMUSCULAR | Status: DC | PRN
  Administered 2012-04-17 (×2): 10 mg via INTRAVENOUS

## 2012-04-17 MED ORDER — SODIUM CHLORIDE 0.9 % IR SOLN
Status: DC | PRN
  Administered 2012-04-17: 13:00:00

## 2012-04-17 MED ORDER — HYDROCODONE-ACETAMINOPHEN 5-325 MG PO TABS
1.0000 | ORAL_TABLET | Freq: Four times a day (QID) | ORAL | Status: DC | PRN
  Administered 2012-04-17: 1 via ORAL
  Filled 2012-04-17: qty 1

## 2012-04-17 MED ORDER — ONDANSETRON HCL 4 MG/2ML IJ SOLN
INTRAMUSCULAR | Status: DC | PRN
  Administered 2012-04-17: 4 mg via INTRAVENOUS

## 2012-04-17 MED ORDER — FENTANYL CITRATE 0.05 MG/ML IJ SOLN
25.0000 ug | INTRAMUSCULAR | Status: DC | PRN
  Administered 2012-04-17: 50 ug via INTRAVENOUS
  Administered 2012-04-17: 25 ug via INTRAVENOUS
  Filled 2012-04-17: qty 1

## 2012-04-17 MED ORDER — FENTANYL CITRATE 0.05 MG/ML IJ SOLN
INTRAMUSCULAR | Status: DC | PRN
  Administered 2012-04-17 (×6): 25 ug via INTRAVENOUS
  Administered 2012-04-17: 50 ug via INTRAVENOUS
  Administered 2012-04-17: 25 ug via INTRAVENOUS

## 2012-04-17 MED ORDER — PROPOFOL 10 MG/ML IV BOLUS
INTRAVENOUS | Status: DC | PRN
  Administered 2012-04-17: 250 mg via INTRAVENOUS

## 2012-04-17 MED ORDER — BUPIVACAINE HCL (PF) 0.25 % IJ SOLN
INTRAMUSCULAR | Status: DC | PRN
  Administered 2012-04-17: 1 mL

## 2012-04-17 SURGICAL SUPPLY — 88 items
BAG DECANTER FOR FLEXI CONT (MISCELLANEOUS) IMPLANT
BANDAGE ELASTIC 3 VELCRO ST LF (GAUZE/BANDAGES/DRESSINGS) IMPLANT
BANDAGE ELASTIC 4 VELCRO ST LF (GAUZE/BANDAGES/DRESSINGS) IMPLANT
BANDAGE ELASTIC 6 VELCRO ST LF (GAUZE/BANDAGES/DRESSINGS) IMPLANT
BANDAGE GAUZE ELAST BULKY 4 IN (GAUZE/BANDAGES/DRESSINGS) IMPLANT
BENZOIN TINCTURE PRP APPL 2/3 (GAUZE/BANDAGES/DRESSINGS) IMPLANT
BLADE MINI RND TIP GREEN BEAV (BLADE) IMPLANT
BLADE SURG 10 STRL SS (BLADE) ×9 IMPLANT
BLADE SURG 15 STRL LF DISP TIS (BLADE) ×4 IMPLANT
BLADE SURG 15 STRL SS (BLADE) ×2
BNDG COHESIVE 1X5 TAN STRL LF (GAUZE/BANDAGES/DRESSINGS) IMPLANT
BNDG COHESIVE 4X5 TAN NS LF (GAUZE/BANDAGES/DRESSINGS) ×3 IMPLANT
BNDG ESMARK 4X9 LF (GAUZE/BANDAGES/DRESSINGS) IMPLANT
CANISTER OMNI JUG 16 LITER (MISCELLANEOUS) IMPLANT
CANISTER SUCTION 1200CC (MISCELLANEOUS) IMPLANT
CANISTER SUCTION 2500CC (MISCELLANEOUS) ×3 IMPLANT
CHLORAPREP W/TINT 26ML (MISCELLANEOUS) IMPLANT
CLOTH BEACON ORANGE TIMEOUT ST (SAFETY) ×3 IMPLANT
CORDS BIPOLAR (ELECTRODE) IMPLANT
COVER MAYO STAND STRL (DRAPES) ×3 IMPLANT
COVER TABLE BACK 60X90 (DRAPES) ×3 IMPLANT
DECANTER SPIKE VIAL GLASS SM (MISCELLANEOUS) IMPLANT
DRAIN PENROSE 18X1/2 LTX STRL (DRAIN) ×3 IMPLANT
DRAPE EXTREMITY T 121X128X90 (DRAPE) IMPLANT
DRAPE INCISE IOBAN 66X45 STRL (DRAPES) IMPLANT
DRAPE LG THREE QUARTER DISP (DRAPES) IMPLANT
DRSG ADAPTIC 3X8 NADH LF (GAUZE/BANDAGES/DRESSINGS) ×3 IMPLANT
DRSG EMULSION OIL 3X3 NADH (GAUZE/BANDAGES/DRESSINGS) IMPLANT
DRSG PAD ABDOMINAL 8X10 ST (GAUZE/BANDAGES/DRESSINGS) IMPLANT
ELECT NEEDLE TIP 2.8 STRL (NEEDLE) IMPLANT
ELECT REM PT RETURN 9FT ADLT (ELECTROSURGICAL) ×3
ELECTRODE REM PT RTRN 9FT ADLT (ELECTROSURGICAL) ×2 IMPLANT
GAUZE SPONGE 4X4 12PLY STRL LF (GAUZE/BANDAGES/DRESSINGS) IMPLANT
GAUZE XEROFORM 1X8 LF (GAUZE/BANDAGES/DRESSINGS) IMPLANT
GAUZE XEROFORM 5X9 LF (GAUZE/BANDAGES/DRESSINGS) IMPLANT
GLOVE BIO SURGEON STRL SZ 6.5 (GLOVE) ×18 IMPLANT
GOWN PREVENTION PLUS XLARGE (GOWN DISPOSABLE) ×3 IMPLANT
HANDPIECE INTERPULSE COAX TIP (DISPOSABLE)
IV NS IRRIG 3000ML ARTHROMATIC (IV SOLUTION) IMPLANT
MATRISTEM MICROMATRIX ×6 IMPLANT
MATRIX SURGICAL PSMX 10X15CM (Tissue) ×3 IMPLANT
MICROMATRIX 500MG (Tissue) ×6 IMPLANT
NEEDLE 27GAX1X1/2 (NEEDLE) IMPLANT
NEEDLE HYPO 30GX1 BEV (NEEDLE) IMPLANT
NS IRRIG 1000ML POUR BTL (IV SOLUTION) ×3 IMPLANT
PACK BASIN DAY SURGERY FS (CUSTOM PROCEDURE TRAY) ×3 IMPLANT
PADDING CAST ABS 3INX4YD NS (CAST SUPPLIES)
PADDING CAST ABS 4INX4YD NS (CAST SUPPLIES)
PADDING CAST ABS COTTON 3X4 (CAST SUPPLIES) IMPLANT
PADDING CAST ABS COTTON 4X4 ST (CAST SUPPLIES) IMPLANT
PENCIL BUTTON HOLSTER BLD 10FT (ELECTRODE) IMPLANT
SET HNDPC FAN SPRY TIP SCT (DISPOSABLE) IMPLANT
SLEEVE SCD COMPRESS KNEE MED (MISCELLANEOUS) IMPLANT
SOLUTION PARTIC MCRMTRX 500MG (Tissue) ×4 IMPLANT
SPLINT PLASTER CAST XFAST 3X15 (CAST SUPPLIES) IMPLANT
SPLINT PLASTER XTRA FASTSET 3X (CAST SUPPLIES)
SPONGE GAUZE 4X4 12PLY (GAUZE/BANDAGES/DRESSINGS) ×3 IMPLANT
SPONGE LAP 18X18 X RAY DECT (DISPOSABLE) IMPLANT
SPONGE LAP 4X18 X RAY DECT (DISPOSABLE) IMPLANT
STAPLER VISISTAT 35W (STAPLE) IMPLANT
STOCKINETTE 4X48 STRL (DRAPES) IMPLANT
STOCKINETTE 6  STRL (DRAPES) ×1
STOCKINETTE 6 STRL (DRAPES) ×2 IMPLANT
STOCKINETTE IMPERVIOUS LG (DRAPES) IMPLANT
STRIP CLOSURE SKIN 1/2X4 (GAUZE/BANDAGES/DRESSINGS) ×3 IMPLANT
SUCTION FRAZIER TIP 10 FR DISP (SUCTIONS) IMPLANT
SURGILUBE 2OZ TUBE FLIPTOP (MISCELLANEOUS) IMPLANT
SUT ETHILON 3 0 PS 1 (SUTURE) IMPLANT
SUT ETHILON 4 0 P 3 18 (SUTURE) IMPLANT
SUT ETHILON 5 0 PS 2 18 (SUTURE) IMPLANT
SUT MON AB 5-0 PS2 18 (SUTURE) ×3 IMPLANT
SUT PROLENE 3 0 PS 2 (SUTURE) IMPLANT
SUT SILK 3 0 PS 1 (SUTURE) IMPLANT
SUT VIC AB 3-0 FS2 27 (SUTURE) IMPLANT
SUT VIC AB 5-0 P-3 18X BRD (SUTURE) IMPLANT
SUT VIC AB 5-0 P3 18 (SUTURE)
SUT VIC AB 5-0 PS2 18 (SUTURE) ×3 IMPLANT
SYR BULB IRRIGATION 50ML (SYRINGE) IMPLANT
SYR CONTROL 10ML LL (SYRINGE) IMPLANT
TAPE HYPAFIX 6X30 (GAUZE/BANDAGES/DRESSINGS) IMPLANT
TIP FLEX 45CM EVICEL (HEMOSTASIS) IMPLANT
TIP RIGID 35CM EVICEL (HEMOSTASIS) ×3 IMPLANT
TOWEL OR 17X24 6PK STRL BLUE (TOWEL DISPOSABLE) ×6 IMPLANT
TRAY DSU PREP LF (CUSTOM PROCEDURE TRAY) ×3 IMPLANT
TUBE CONNECTING 12X1/4 (SUCTIONS) ×3 IMPLANT
UNDERPAD 30X30 INCONTINENT (UNDERPADS AND DIAPERS) ×3 IMPLANT
WATER STERILE IRR 1000ML POUR (IV SOLUTION) ×3 IMPLANT
YANKAUER SUCT BULB TIP NO VENT (SUCTIONS) IMPLANT

## 2012-04-17 NOTE — Brief Op Note (Signed)
04/17/2012  1:46 PM  PATIENT:  Julia West  57 y.o. female  PRE-OPERATIVE DIAGNOSIS:  NON HEALING SURGICAL WOUND OF THE ABDOMEN  POST-OPERATIVE DIAGNOSIS:  NON HEALING SURGICAL WOUND OF THE ABDOMEN  PROCEDURE:  Procedure(s) (LRB) with comments: IRRIGATION AND DEBRIDEMENT WOUND (N/A) - OF ABDOMINAL WOUND, SURGICAL PREP AND PLACEMENT OF VAC, REMOVAL FOREHEAD SKIN LESION LESION REMOVAL (N/A) -  CENTER OF FOREHEAD, REMOVAL FORHEAD SKIN LESION  SURGEON:  Surgeon(s) and Role:    * Maylee Bare [facility], DO - Primary  PHYSICIAN ASSISTANT: Shawn Rayburn, PA  ASSISTANTS: none   ANESTHESIA:   general  EBL:  Total I/O In: 1000 [I.V.:1000] Out: 300 [Blood:300]  BLOOD ADMINISTERED:none  DRAINS: none   LOCAL MEDICATIONS USED:  LIDOCAINE   SPECIMEN:  Source of Specimen:  forehead changing skin lesion  DISPOSITION OF SPECIMEN:  PATHOLOGY  COUNTS:  YES  TOURNIQUET:  * No tourniquets in log *  DICTATION: dictated  PLAN OF CARE: Discharge to home after PACU  PATIENT DISPOSITION:  PACU - hemodynamically stable.   Delay start of Pharmacological VTE agent (>24hrs) due to surgical blood loss or risk of bleeding: no

## 2012-04-17 NOTE — Anesthesia Postprocedure Evaluation (Signed)
Anesthesia Post Note  Patient: Julia West  Procedure(s) Performed: Procedure(s) (LRB): IRRIGATION AND DEBRIDEMENT WOUND (N/A) LESION REMOVAL (N/A)  Anesthesia type: General  Patient location: PACU  Post pain: Pain level controlled  Post assessment: Post-op Vital signs reviewed  Last Vitals:  Filed Vitals:   04/17/12 1415  BP: 116/61  Pulse: 68  Temp:   Resp: 24    Post vital signs: Reviewed  Level of consciousness: sedated  Complications: No apparent anesthesia complications

## 2012-04-17 NOTE — Transfer of Care (Signed)
Immediate Anesthesia Transfer of Care Note  Patient: [name]  Procedure(s) Performed: Procedure(s) (LRB): IRRIGATION AND DEBRIDEMENT WOUND (N/A) LESION REMOVAL (N/A)  Patient Location: Patient transported to PACU with oxygen via face mask at 4 Liters / Min  Anesthesia Type: General  Level of Consciousness: awake and alert   Airway & Oxygen Therapy: Patient Spontanous Breathing and Patient connected to face mask oxygen  Post-op Assessment: Report given to PACU RN and Post -op Vital signs reviewed and stable  Post vital signs: Reviewed and stable  Dentition: Teeth and oropharynx remain in pre-op condition  Complications: No apparent anesthesia complications

## 2012-04-17 NOTE — H&P (View-Only) (Signed)
Julia West is an 56 y.o. female.   Chief Complaint: Large abdominal ulcer HPI: The patient is a 56 yrs old wf here for treatment of her large abdominal ulcer.  She had a hernia that was treated and underwent a laparotomy.  Post operatively she had loss of a large area of the periumbilical area that is full thickness.  She underwent debridement in the last few weeks.  She has remaining necrotic tissue in the abdominal wall.  The base of he ulcer has granulating tissue.  Past Medical History  Diagnosis Date  . Morbid obesity   . Other and unspecified hyperlipidemia   . Unspecified essential hypertension   . Acute myocardial infarction     with a ruptured plaque in the circumflex in 2003  . Degenerative joint disease   . Internal and external hemorrhoids without complication   . Anxiety   . Coronary artery disease   . Complication of anesthesia     states low O2 sats post-op 11/13  . Skin lesion     forehead    Past Surgical History  Procedure Date  . Cesarean section     x 4 in remote past  . Cholecystectomy   . Coronary artery bypass graft 2003    by Dr. Gerhardt. LIMA to the LAD, free RIMA to the circumflex. Stress perfusion study December 2009 with no high-risk areas of ischemia. She has a well-preserved ejection fraction  . Incisional hernia repair 02/07/2012    Procedure: HERNIA REPAIR INCISIONAL;  Surgeon: Eric M Wilson, MD,FACS;  Location: [facility];  Service: General;;  Open, Primary repair, strangulated Incisional hernia.  . Bowel resection 02/07/2012    Procedure: SMALL BOWEL RESECTION;  Surgeon: Eric M Wilson, MD,FACS;  Location: [facility];  Service: General;;  . Application of wound vac     Family History  Problem Relation Age of Onset  . Heart attack Mother   . Hypertension Sister    Social History:  reports that she quit smoking about 4 months ago. Her smoking use included Cigarettes. She has a 25 pack-year smoking history. She does not have any smokeless tobacco  history on file. She reports that she does not drink alcohol or use illicit drugs.  Allergies:  Allergies  Allergen Reactions  . Ace Inhibitors Cough     (Not in a hospital admission)  No results found for this or any previous visit (from the past 48 hour(s)). No results found.  Review of Systems  Constitutional: Negative.   HENT: Negative.   Eyes: Negative.   Respiratory: Negative.   Cardiovascular: Negative.   Gastrointestinal: Negative.   Genitourinary: Negative.   Musculoskeletal: Negative.   Skin: Negative.   Neurological: Negative.   Psychiatric/Behavioral: Negative.     There were no vitals taken for this visit. Physical Exam  Constitutional: She appears well-developed and well-nourished.  HENT:  Head: Normocephalic and atraumatic.  Eyes: Conjunctivae normal and EOM are normal. Pupils are equal, round, and reactive to light.  Neck: Normal range of motion.  Cardiovascular: Normal rate.   Respiratory: Effort normal. No respiratory distress. She has no wheezes.  GI: Soft. There is tenderness.    Musculoskeletal: Normal range of motion.  Neurological: She is alert.  Skin: Skin is warm.  Psychiatric: She has a normal mood and affect. Her behavior is normal. Judgment and thought content normal.     Assessment/Plan Large abdominal ulcer - plan for excision and debridement of abdominal ulcer with placement of Acell and the   VAC. Risks and complications were discussed.  SANGER,[name] Llerena 04/12/2012, 4:19 PM    

## 2012-04-17 NOTE — Anesthesia Procedure Notes (Signed)
Procedure Name: LMA Insertion Date/Time: 04/17/2012 12:33 PM Performed by: Fran Lowes Pre-anesthesia Checklist: Patient identified, Emergency Drugs available, Suction available and Patient being monitored Patient Re-evaluated:Patient Re-evaluated prior to inductionOxygen Delivery Method: Circle System Utilized Preoxygenation: Pre-oxygenation with 100% oxygen Intubation Type: IV induction Ventilation: Mask ventilation without difficulty LMA: LMA with gastric port inserted LMA Size: 5.0 Number of attempts: 1 Airway Equipment and Method: bite block Placement Confirmation: positive ETCO2 Tube secured with: Tape Dental Injury: Teeth and Oropharynx as per pre-operative assessment

## 2012-04-17 NOTE — Anesthesia Preprocedure Evaluation (Addendum)
Anesthesia Evaluation  Patient identified by MRN, date of birth, ID band Patient awake    Reviewed: Allergy & Precautions, H&P , NPO status , Patient's Chart, lab work & pertinent test results, reviewed documented beta blocker date and time   Airway Mallampati: II TM Distance: >3 FB Neck ROM: full    Dental No notable dental hx. (+) Missing, Edentulous Upper and Dental Advisory Given,    Pulmonary neg pulmonary ROS,  Low )2 sats post op 11/13 breath sounds clear to auscultation  Pulmonary exam normal       Cardiovascular hypertension, Pt. on home beta blockers + CAD, + Past MI and + CABG Rhythm:regular Rate:Normal  AMI circumflex 2003   Neuro/Psych negative neurological ROS  negative psych ROS   GI/Hepatic negative GI ROS, Neg liver ROS,   Endo/Other  negative endocrine ROSMorbid obesity  Renal/GU negative Renal ROS  negative genitourinary   Musculoskeletal   Abdominal (+) + obese,   Peds  Hematology negative hematology ROS (+)   Anesthesia Other Findings   Reproductive/Obstetrics negative OB ROS                          Anesthesia Physical Anesthesia Plan  ASA: III  Anesthesia Plan: General   Post-op Pain Management:    Induction: Intravenous  Airway Management Planned: LMA  Additional Equipment:   Intra-op Plan:   Post-operative Plan:   Informed Consent: I have reviewed the patients History and Physical, chart, labs and discussed the procedure including the risks, benefits and alternatives for the proposed anesthesia with the patient or authorized representative who has indicated his/her understanding and acceptance.   Dental Advisory Given  Plan Discussed with: CRNA and Surgeon  Anesthesia Plan Comments:         Anesthesia Quick Evaluation

## 2012-04-17 NOTE — Interval H&P Note (Signed)
History and Physical Interval Note:  04/17/2012 12:14 PM  Julia West  has presented today for surgery, with the diagnosis of NON HEALING SURGICAL WOUND OF THE ABDOMEN  The various methods of treatment have been discussed with the patient and family. After consideration of risks, benefits and other options for treatment, the patient has consented to  Procedure(s) (LRB) with comments: IRRIGATION AND DEBRIDEMENT WOUND (N/A) - OF ABDOMINAL WOUND, POSSIBLE SURGICAL PREP AND PLACEMENT OF VAC as a surgical intervention .  The patient's history has been reviewed, patient examined, no change in status, stable for surgery.  I have reviewed the patient's chart and labs.  Questions were answered to the patient's satisfaction.   We will also excise the forehead lesion that is 1 cm in size.  SANGER,Yosiel Thieme

## 2012-04-18 ENCOUNTER — Encounter (HOSPITAL_BASED_OUTPATIENT_CLINIC_OR_DEPARTMENT_OTHER): Payer: Self-pay | Admitting: Plastic Surgery

## 2012-04-18 NOTE — Op Note (Signed)
NAME:  West, Julia           ACCOUNT NO.:  [id number]  MEDICAL RECORD NO.:  [id number]  LOCATION: [facility] FACILITY:  [facility]  PHYSICIAN:  Claire Sanger, DO      DATE OF BIRTH:  06/30/1955  DATE OF PROCEDURE:  04/17/2012 DATE OF DISCHARGE:                              OPERATIVE REPORT   PREOPERATIVE DIAGNOSIS:  Large abdominal wall ulcer.                          1 cm changing skin lesion of forehead  POSTOPERATIVE DIAGNOSIS:  Large abdominal wall ulcer.                          1 cm changing skin lesion of forehead  PROCEDURE:  Irrigation and debridement of abdominal wall ulcer, skin, subcutaneous tissue, and fat for purpose of placement of ACell and VAC. ACell 3 g and a 5 x 7 sheet and a large VAC, the wound was 17 x 11 x 7 Cm. Excision of 1 cm changing skin lesion of forehead.  ATTENDING SURGEON:  Claire Sanger, DO  ASSISTANT:  Shawn Rayburn, PA  ANESTHESIA:  General.  INDICATION FOR PROCEDURE:  The patient is a 56-year-old female with an extremely large abdominal ulcer from a hernia with complications postoperatively.  She presents for debridement.  She has undergone debridements by General Surgery in the office and just kept breaking down tissue and presenting with necrosis.  The decision was made to bring her to the OR.  Risks and complications were reviewed.  DESCRIPTION OF PROCEDURE:  The patient was taken to the operating room, placed on the operating room table in the supine position.  General anesthesia was administered.  Once adequate, a time-out was called.  All information was confirmed to be correct.  She was prepped and draped in the usual sterile fashion.  Using a 10 blade and a pair of Metz, the nonviable fat was debrided sharply to more viable tissue.  Some of the flaps were grave looking and some of it was just purely necrotic. Hemostasis was achieved with electrocautery.  There were no arterial vessels noted but some  engorged veins.  The pocket was irrigated with antibiotic solution and normal saline.  The skin, subcutaneous tissue, and fat was debrided.  The resulting wound was 20 x 15 x 7 cm.  ACell powder 3 g was placed in the area followed by an ACell sheet which was prepared according to the manufacturer guidelines and pinhole.  It was sutured in place with 5-0 Vicryl.  Adaptic was applied with Hydrogel and A VAC was applied to 75 mmHg pressure.    The forehead was prepped with betadine.  Lidocaine 1% with epinepherine  was injected around the forehead lesion.  After waiting several minutes for the epinepherine to take effect the lesion was excised in an elliptical fashion.  The skin edges were re-approximated with 5-0 monocryl. The patient did tolerate the procedure well.  There were no complications.  She was allowed to wake up, extubated, and taken to recovery in stable condition.    Claire Sanger, DO    CS/MEDQ  D:  04/17/2012  T:  04/18/2012  Job:  [id number] 

## 2012-04-18 NOTE — Op Note (Deleted)
NAME:  Julia West, Julia West NO.:  [id number]  MEDICAL RECORD NO.:  [id number]  LOCATION: [facility] FACILITY:  [facility]  PHYSICIAN:  Wayland Denis, DO      DATE OF BIRTH:  Mar 02, 1956  DATE OF PROCEDURE:  04/17/2012 DATE OF DISCHARGE:                              OPERATIVE REPORT   PREOPERATIVE DIAGNOSIS:  Large abdominal wall ulcer.                          1 cm changing skin lesion of forehead  POSTOPERATIVE DIAGNOSIS:  Large abdominal wall ulcer.                          1 cm changing skin lesion of forehead  PROCEDURE:  Irrigation and debridement of abdominal wall ulcer, skin, subcutaneous tissue, and fat for purpose of placement of ACell and VAC. ACell 3 g and a 5 x 7 sheet and a large VAC, the wound was 17 x 11 x 7 Cm. Excision of 1 cm changing skin lesion of forehead.  ATTENDING SURGEON:  Wayland Denis, DO  ASSISTANT:  Lazaro Arms, PA  ANESTHESIA:  General.  INDICATION FOR PROCEDURE:  The patient is a 57 year old female with an extremely large abdominal ulcer from a hernia with complications postoperatively.  She presents for debridement.  She has undergone debridements by General Surgery in the office and just kept breaking down tissue and presenting with necrosis.  The decision was made to bring her to the OR.  Risks and complications were reviewed.  DESCRIPTION OF PROCEDURE:  The patient was taken to the operating room, placed on the operating room table in the supine position.  General anesthesia was administered.  Once adequate, a time-out was called.  All information was confirmed to be correct.  She was prepped and draped in the usual sterile fashion.  Using a 10 blade and a pair of Metz, the nonviable fat was debrided sharply to more viable tissue.  Some of the flaps were grave looking and some of it was just purely necrotic. Hemostasis was achieved with electrocautery.  There were no arterial vessels noted but some  engorged veins.  The pocket was irrigated with antibiotic solution and normal saline.  The skin, subcutaneous tissue, and fat was debrided.  The resulting wound was 20 x 15 x 7 cm.  ACell powder 3 g was placed in the area followed by an ACell sheet which was prepared according to the manufacturer guidelines and pinhole.  It was sutured in place with 5-0 Vicryl.  Adaptic was applied with Hydrogel and A VAC was applied to 75 mmHg pressure.    The forehead was prepped with betadine.  Lidocaine 1% with epinepherine  was injected around the forehead lesion.  After waiting several minutes for the epinepherine to take effect the lesion was excised in an elliptical fashion.  The skin edges were re-approximated with 5-0 monocryl. The patient did tolerate the procedure well.  There were no complications.  She was allowed to wake up, extubated, and taken to recovery in stable condition.    Wayland Denis, DO    CS/MEDQ  D:  04/17/2012  T:  04/18/2012  Job:  [id number]

## 2012-04-21 ENCOUNTER — Encounter (HOSPITAL_BASED_OUTPATIENT_CLINIC_OR_DEPARTMENT_OTHER): Payer: Self-pay | Admitting: *Deleted

## 2012-04-21 NOTE — Progress Notes (Signed)
NPO AFTER MN. ARRIVES AT 1115. NEEDS ISTAT. CURRENT EKG IN EPIC AND CHART. WILL TAKE METOPROLOL AM OF SURG. W/ SIP OF WATER. 

## 2012-04-24 ENCOUNTER — Other Ambulatory Visit: Payer: Self-pay | Admitting: Physician Assistant

## 2012-04-24 ENCOUNTER — Other Ambulatory Visit: Payer: Self-pay | Admitting: Plastic Surgery

## 2012-04-24 ENCOUNTER — Ambulatory Visit (HOSPITAL_BASED_OUTPATIENT_CLINIC_OR_DEPARTMENT_OTHER)
Admission: RE | Admit: 2012-04-24 | Discharge: 2012-04-24 | Disposition: A | Discharge status: Home / Self Care | Payer: Medicare Other | Source: Ambulatory Visit | Attending: Plastic Surgery | Admitting: Plastic Surgery

## 2012-04-24 ENCOUNTER — Encounter (HOSPITAL_BASED_OUTPATIENT_CLINIC_OR_DEPARTMENT_OTHER): Payer: Self-pay | Admitting: Anesthesiology

## 2012-04-24 ENCOUNTER — Encounter (HOSPITAL_BASED_OUTPATIENT_CLINIC_OR_DEPARTMENT_OTHER)
Admission: RE | Disposition: A | Discharge status: Home / Self Care | Payer: Self-pay | Source: Ambulatory Visit | Attending: Plastic Surgery

## 2012-04-24 ENCOUNTER — Encounter (HOSPITAL_BASED_OUTPATIENT_CLINIC_OR_DEPARTMENT_OTHER): Payer: Self-pay | Admitting: Plastic Surgery

## 2012-04-24 ENCOUNTER — Ambulatory Visit (HOSPITAL_BASED_OUTPATIENT_CLINIC_OR_DEPARTMENT_OTHER): Payer: Medicare Other | Admitting: Anesthesiology

## 2012-04-24 DIAGNOSIS — L98499 Non-pressure chronic ulcer of skin of other sites with unspecified severity: Secondary | ICD-10-CM | POA: Insufficient documentation

## 2012-04-24 DIAGNOSIS — Z951 Presence of aortocoronary bypass graft: Secondary | ICD-10-CM | POA: Insufficient documentation

## 2012-04-24 DIAGNOSIS — I252 Old myocardial infarction: Secondary | ICD-10-CM | POA: Insufficient documentation

## 2012-04-24 DIAGNOSIS — E785 Hyperlipidemia, unspecified: Secondary | ICD-10-CM | POA: Insufficient documentation

## 2012-04-24 DIAGNOSIS — I2510 Atherosclerotic heart disease of native coronary artery without angina pectoris: Secondary | ICD-10-CM | POA: Insufficient documentation

## 2012-04-24 DIAGNOSIS — L989 Disorder of the skin and subcutaneous tissue, unspecified: Secondary | ICD-10-CM | POA: Insufficient documentation

## 2012-04-24 DIAGNOSIS — T8189XA Other complications of procedures, not elsewhere classified, initial encounter: Secondary | ICD-10-CM

## 2012-04-24 HISTORY — PX: INCISION AND DRAINAGE OF WOUND: SHX1803

## 2012-04-24 HISTORY — DX: Other complications of procedures, not elsewhere classified, initial encounter: T81.89XA

## 2012-04-24 LAB — POCT I-STAT 4, (NA,K, GLUC, HGB,HCT)
Glucose, Bld: 139 mg/dL — ABNORMAL HIGH (ref 70–99)
HCT: 31 % — ABNORMAL LOW (ref 36.0–46.0)
Hemoglobin: 10.5 g/dL — ABNORMAL LOW (ref 12.0–15.0)
Potassium: 3.8 meq/L (ref 3.5–5.1)
Sodium: 137 meq/L (ref 135–145)

## 2012-04-24 SURGERY — IRRIGATION AND DEBRIDEMENT WOUND
Anesthesia: General | Site: Abdomen | Wound class: Contaminated

## 2012-04-24 MED ORDER — METRONIDAZOLE IN NACL 5-0.79 MG/ML-% IV SOLN
INTRAVENOUS | Status: DC | PRN
  Administered 2012-04-24: 500 mg via INTRAVENOUS

## 2012-04-24 MED ORDER — 0.9 % SODIUM CHLORIDE (POUR BTL) OPTIME
TOPICAL | Status: DC | PRN
  Administered 2012-04-24: 6000 mL

## 2012-04-24 MED ORDER — SUCCINYLCHOLINE CHLORIDE 20 MG/ML IJ SOLN
INTRAMUSCULAR | Status: DC | PRN
  Administered 2012-04-24: 140 mg via INTRAVENOUS

## 2012-04-24 MED ORDER — LACTATED RINGERS IV SOLN
INTRAVENOUS | Status: DC | PRN
  Administered 2012-04-24 (×2): via INTRAVENOUS

## 2012-04-24 MED ORDER — FENTANYL CITRATE 0.05 MG/ML IJ SOLN
INTRAMUSCULAR | Status: DC | PRN
  Administered 2012-04-24 (×6): 50 ug via INTRAVENOUS

## 2012-04-24 MED ORDER — DEXTROSE 5 % IV SOLN
3.0000 g | INTRAVENOUS | Status: DC | PRN
  Administered 2012-04-24: 3 g via INTRAVENOUS

## 2012-04-24 MED ORDER — ONDANSETRON HCL 4 MG/2ML IJ SOLN
INTRAMUSCULAR | Status: DC | PRN
  Administered 2012-04-24: 4 mg via INTRAVENOUS

## 2012-04-24 MED ORDER — EPHEDRINE SULFATE 50 MG/ML IJ SOLN
INTRAMUSCULAR | Status: DC | PRN
  Administered 2012-04-24 (×4): 10 mg via INTRAVENOUS

## 2012-04-24 MED ORDER — LACTATED RINGERS IV SOLN
INTRAVENOUS | Status: DC
  Filled 2012-04-24: qty 1000

## 2012-04-24 MED ORDER — DEXAMETHASONE SODIUM PHOSPHATE 4 MG/ML IJ SOLN
INTRAMUSCULAR | Status: DC | PRN
  Administered 2012-04-24: 4 mg via INTRAVENOUS

## 2012-04-24 MED ORDER — SODIUM CHLORIDE 0.9 % IR SOLN
Status: DC | PRN
  Administered 2012-04-24: 14:00:00

## 2012-04-24 MED ORDER — LIDOCAINE HCL 4 % MT SOLN
OROMUCOSAL | Status: DC | PRN
  Administered 2012-04-24: 4 mL via TOPICAL

## 2012-04-24 MED ORDER — PROPOFOL 10 MG/ML IV BOLUS
INTRAVENOUS | Status: DC | PRN
  Administered 2012-04-24: 200 mg via INTRAVENOUS
  Administered 2012-04-24: 30 mg via INTRAVENOUS

## 2012-04-24 MED ORDER — MIDAZOLAM HCL 5 MG/5ML IJ SOLN
INTRAMUSCULAR | Status: DC | PRN
  Administered 2012-04-24 (×2): 1 mg via INTRAVENOUS

## 2012-04-24 MED ORDER — LIDOCAINE HCL (CARDIAC) 20 MG/ML IV SOLN
INTRAVENOUS | Status: DC | PRN
  Administered 2012-04-24: 40 mg via INTRAVENOUS
  Administered 2012-04-24: 60 mg via INTRAVENOUS

## 2012-04-24 SURGICAL SUPPLY — 88 items
BAG DECANTER FOR FLEXI CONT (MISCELLANEOUS) IMPLANT
BANDAGE ELASTIC 3 VELCRO ST LF (GAUZE/BANDAGES/DRESSINGS) IMPLANT
BANDAGE ELASTIC 4 VELCRO ST LF (GAUZE/BANDAGES/DRESSINGS) IMPLANT
BANDAGE ELASTIC 6 VELCRO ST LF (GAUZE/BANDAGES/DRESSINGS) IMPLANT
BANDAGE GAUZE ELAST BULKY 4 IN (GAUZE/BANDAGES/DRESSINGS) IMPLANT
BENZOIN TINCTURE PRP APPL 2/3 (GAUZE/BANDAGES/DRESSINGS) IMPLANT
BLADE MINI RND TIP GREEN BEAV (BLADE) IMPLANT
BLADE SURG 10 STRL SS (BLADE) ×10 IMPLANT
BLADE SURG 15 STRL LF DISP TIS (BLADE) ×1 IMPLANT
BLADE SURG 15 STRL SS (BLADE) ×1
BNDG COHESIVE 1X5 TAN STRL LF (GAUZE/BANDAGES/DRESSINGS) IMPLANT
BNDG COHESIVE 4X5 TAN NS LF (GAUZE/BANDAGES/DRESSINGS) IMPLANT
BNDG ESMARK 4X9 LF (GAUZE/BANDAGES/DRESSINGS) IMPLANT
CANISTER OMNI JUG 16 LITER (MISCELLANEOUS) IMPLANT
CANISTER SUCTION 1200CC (MISCELLANEOUS) IMPLANT
CANISTER SUCTION 2500CC (MISCELLANEOUS) ×4 IMPLANT
CHLORAPREP W/TINT 26ML (MISCELLANEOUS) IMPLANT
CLOTH BEACON ORANGE TIMEOUT ST (SAFETY) ×2 IMPLANT
CORDS BIPOLAR (ELECTRODE) IMPLANT
COVER MAYO STAND STRL (DRAPES) ×2 IMPLANT
COVER TABLE BACK 60X90 (DRAPES) ×2 IMPLANT
DECANTER SPIKE VIAL GLASS SM (MISCELLANEOUS) IMPLANT
DRAIN PENROSE 18X1/2 LTX STRL (DRAIN) IMPLANT
DRAPE EXTREMITY T 121X128X90 (DRAPE) IMPLANT
DRAPE INCISE IOBAN 66X45 STRL (DRAPES) ×2 IMPLANT
DRAPE LG THREE QUARTER DISP (DRAPES) IMPLANT
DRSG ADAPTIC 3X8 NADH LF (GAUZE/BANDAGES/DRESSINGS) ×6 IMPLANT
DRSG EMULSION OIL 3X3 NADH (GAUZE/BANDAGES/DRESSINGS) IMPLANT
DRSG PAD ABDOMINAL 8X10 ST (GAUZE/BANDAGES/DRESSINGS) IMPLANT
ELECT NEEDLE TIP 2.8 STRL (NEEDLE) IMPLANT
ELECT REM PT RETURN 9FT ADLT (ELECTROSURGICAL) ×2
ELECTRODE REM PT RTRN 9FT ADLT (ELECTROSURGICAL) ×1 IMPLANT
GAUZE SPONGE 4X4 12PLY STRL LF (GAUZE/BANDAGES/DRESSINGS) IMPLANT
GAUZE XEROFORM 1X8 LF (GAUZE/BANDAGES/DRESSINGS) IMPLANT
GAUZE XEROFORM 5X9 LF (GAUZE/BANDAGES/DRESSINGS) IMPLANT
GLOVE BIO SURGEON STRL SZ 6.5 (GLOVE) ×6 IMPLANT
GOWN PREVENTION PLUS XLARGE (GOWN DISPOSABLE) IMPLANT
HANDPIECE INTERPULSE COAX TIP (DISPOSABLE)
IV NS IRRIG 3000ML ARTHROMATIC (IV SOLUTION) IMPLANT
MATRIX SURGICAL PSM 10X15CM (Tissue) ×2 IMPLANT
MICROMATRIX 1000MG (Tissue) ×4 IMPLANT
NEEDLE 27GAX1X1/2 (NEEDLE) ×2 IMPLANT
NEEDLE HYPO 30GX1 BEV (NEEDLE) IMPLANT
NS IRRIG 1000ML POUR BTL (IV SOLUTION) ×12 IMPLANT
PACK BASIN DAY SURGERY FS (CUSTOM PROCEDURE TRAY) ×2 IMPLANT
PADDING CAST ABS 3INX4YD NS (CAST SUPPLIES)
PADDING CAST ABS 4INX4YD NS (CAST SUPPLIES)
PADDING CAST ABS COTTON 3X4 (CAST SUPPLIES) IMPLANT
PADDING CAST ABS COTTON 4X4 ST (CAST SUPPLIES) IMPLANT
PENCIL BUTTON HOLSTER BLD 10FT (ELECTRODE) ×2 IMPLANT
SET HNDPC FAN SPRY TIP SCT (DISPOSABLE) IMPLANT
SLEEVE SCD COMPRESS KNEE MED (MISCELLANEOUS) IMPLANT
SOLUTION PARTIC MCRMTRX 1000MG (Tissue) ×2 IMPLANT
SPLINT PLASTER CAST XFAST 3X15 (CAST SUPPLIES) IMPLANT
SPLINT PLASTER XTRA FASTSET 3X (CAST SUPPLIES)
SPONGE GAUZE 4X4 12PLY (GAUZE/BANDAGES/DRESSINGS) ×2 IMPLANT
SPONGE LAP 18X18 X RAY DECT (DISPOSABLE) ×8 IMPLANT
SPONGE LAP 4X18 X RAY DECT (DISPOSABLE) ×2 IMPLANT
STAPLER VISISTAT 35W (STAPLE) IMPLANT
STOCKINETTE 4X48 STRL (DRAPES) IMPLANT
STOCKINETTE 6  STRL (DRAPES)
STOCKINETTE 6 STRL (DRAPES) IMPLANT
STOCKINETTE IMPERVIOUS LG (DRAPES) IMPLANT
STRIP CLOSURE SKIN 1/2X4 (GAUZE/BANDAGES/DRESSINGS) IMPLANT
SUCTION FRAZIER TIP 10 FR DISP (SUCTIONS) IMPLANT
SURGILUBE 2OZ TUBE FLIPTOP (MISCELLANEOUS) ×2 IMPLANT
SUT ETHILON 2 0 FS 18 (SUTURE) ×2 IMPLANT
SUT ETHILON 3 0 PS 1 (SUTURE) ×4 IMPLANT
SUT ETHILON 4 0 P 3 18 (SUTURE) IMPLANT
SUT ETHILON 5 0 PS 2 18 (SUTURE) IMPLANT
SUT PROLENE 3 0 PS 2 (SUTURE) IMPLANT
SUT SILK 3 0 PS 1 (SUTURE) IMPLANT
SUT VIC AB 3-0 FS2 27 (SUTURE) IMPLANT
SUT VIC AB 5-0 P-3 18X BRD (SUTURE) IMPLANT
SUT VIC AB 5-0 P3 18 (SUTURE)
SUT VIC AB 5-0 PS2 18 (SUTURE) ×4 IMPLANT
SWAB CULTURE LIQ STUART DBL (MISCELLANEOUS) ×4 IMPLANT
SYR BULB IRRIGATION 50ML (SYRINGE) ×2 IMPLANT
SYR CONTROL 10ML LL (SYRINGE) ×2 IMPLANT
TAPE HYPAFIX 6X30 (GAUZE/BANDAGES/DRESSINGS) IMPLANT
TIP FLEX 45CM EVICEL (HEMOSTASIS) IMPLANT
TIP RIGID 35CM EVICEL (HEMOSTASIS) ×2 IMPLANT
TOWEL OR 17X24 6PK STRL BLUE (TOWEL DISPOSABLE) ×4 IMPLANT
TRAY DSU PREP LF (CUSTOM PROCEDURE TRAY) ×2 IMPLANT
TUBE CONNECTING 12X1/4 (SUCTIONS) ×2 IMPLANT
UNDERPAD 30X30 INCONTINENT (UNDERPADS AND DIAPERS) ×2 IMPLANT
WATER STERILE IRR 1000ML POUR (IV SOLUTION) ×2 IMPLANT
YANKAUER SUCT BULB TIP NO VENT (SUCTIONS) IMPLANT

## 2012-04-24 NOTE — Progress Notes (Signed)
Dr. Acey Lav called and reported room air sats from 88-97%, using IS and up to 2500, Ok to discharge home.

## 2012-04-24 NOTE — H&P (View-Only) (Signed)
Julia West is an 57 y.o. female.   Chief Complaint: Large abdominal ulcer HPI: The patient is a 57 yrs old wf here for treatment of her large abdominal ulcer.  She had a hernia that was treated and underwent a laparotomy.  Post operatively she had loss of a large area of the periumbilical area that is full thickness.  She underwent debridement in the last few weeks.  She has remaining necrotic tissue in the abdominal wall.  The base of he ulcer has granulating tissue.  Past Medical History  Diagnosis Date  . Morbid obesity   . Other and unspecified hyperlipidemia   . Unspecified essential hypertension   . Acute myocardial infarction     with a ruptured plaque in the circumflex in 2003  . Degenerative joint disease   . Internal and external hemorrhoids without complication   . Anxiety   . Coronary artery disease   . Complication of anesthesia     states low O2 sats post-op 11/13  . Skin lesion     forehead    Past Surgical History  Procedure Date  . Cesarean section     x 4 in remote past  . Cholecystectomy   . Coronary artery bypass graft 2003    by Dr. Tyrone Sage. LIMA to the LAD, free RIMA to the circumflex. Stress perfusion study December 2009 with no high-risk areas of ischemia. She has a well-preserved ejection fraction  . Incisional hernia repair 02/07/2012    Procedure: HERNIA REPAIR INCISIONAL;  Surgeon: [name], MD,FACS;  Location: [facility] OR;  Service: General;;  Open, Primary repair, strangulated Incisional hernia.  . Bowel resection 02/07/2012    Procedure: SMALL BOWEL RESECTION;  Surgeon: [name], MD,FACS;  Location: [facility] OR;  Service: General;;  . Application of wound vac     Family History  Problem Relation Age of Onset  . Heart attack Mother   . Hypertension Sister    Social History:  reports that she quit smoking about 4 months ago. Her smoking use included Cigarettes. She has a 25 pack-year smoking history. She does not have any smokeless tobacco  history on file. She reports that she does not drink alcohol or use illicit drugs.  Allergies:  Allergies  Allergen Reactions  . Ace Inhibitors Cough     (Not in a hospital admission)  No results found for this or any previous visit (from the past 48 hour(s)). No results found.  Review of Systems  Constitutional: Negative.   HENT: Negative.   Eyes: Negative.   Respiratory: Negative.   Cardiovascular: Negative.   Gastrointestinal: Negative.   Genitourinary: Negative.   Musculoskeletal: Negative.   Skin: Negative.   Neurological: Negative.   Psychiatric/Behavioral: Negative.     There were no vitals taken for this visit. Physical Exam  Constitutional: She appears well-developed and well-nourished.  HENT:  Head: Normocephalic and atraumatic.  Eyes: Conjunctivae normal and EOM are normal. Pupils are equal, round, and reactive to light.  Neck: Normal range of motion.  Cardiovascular: Normal rate.   Respiratory: Effort normal. No respiratory distress. She has no wheezes.  GI: Soft. There is tenderness.    Musculoskeletal: Normal range of motion.  Neurological: She is alert.  Skin: Skin is warm.  Psychiatric: She has a normal mood and affect. Her behavior is normal. Judgment and thought content normal.     Assessment/Plan Large abdominal ulcer - plan for excision and debridement of abdominal ulcer with placement of Acell and the  VAC. Risks and complications were discussed.  JuliaIyannah West 04/12/2012, 4:19 PM

## 2012-04-24 NOTE — Anesthesia Postprocedure Evaluation (Signed)
Anesthesia Post Note  Patient: Julia West  Procedure(s) Performed: Procedure(s) (LRB): IRRIGATION AND DEBRIDEMENT WOUND (N/A)  Anesthesia type: General  Patient location: PACU  Post pain: Pain level controlled  Post assessment: Post-op Vital signs reviewed  Last Vitals: BP 112/46  Pulse 73  Temp(Src) 37.3 C (Oral)  Resp 24  Ht 5\' 7"  (1.702 m)  Wt 280 lb (127.007 kg)  BMI 43.84 kg/m2  SpO2 98%  Post vital signs: Reviewed  Level of consciousness: sedated  Complications: No apparent anesthesia complications

## 2012-04-24 NOTE — OR Nursing (Signed)
Pre procedural timi out done at 1245pm

## 2012-04-24 NOTE — Anesthesia Preprocedure Evaluation (Addendum)
Anesthesia Evaluation  Patient identified by MRN, date of birth, ID band Patient awake    Reviewed: Allergy & Precautions, H&P , NPO status , Patient's Chart, lab work & pertinent test results, reviewed documented beta blocker date and time   Airway Mallampati: II TM Distance: >3 FB Neck ROM: full    Dental no notable dental hx. (+) Missing, Edentulous Upper and Dental Advisory Given,    Pulmonary neg pulmonary ROS,  Low )2 sats post op 11/13 breath sounds clear to auscultation  Pulmonary exam normal       Cardiovascular hypertension, Pt. on home beta blockers and Pt. on medications + CAD, + Past MI and + CABG Rhythm:regular Rate:Normal  AMI circumflex 2003   Neuro/Psych Anxiety negative neurological ROS     GI/Hepatic negative GI ROS, Neg liver ROS,   Endo/Other  Morbid obesity  Renal/GU negative Renal ROS  negative genitourinary   Musculoskeletal   Abdominal (+) + obese,   Peds  Hematology negative hematology ROS (+)   Anesthesia Other Findings   Reproductive/Obstetrics negative OB ROS                           Anesthesia Physical Anesthesia Plan  ASA: III  Anesthesia Plan: General   Post-op Pain Management:    Induction: Intravenous  Airway Management Planned: Oral ETT  Additional Equipment:   Intra-op Plan:   Post-operative Plan: Extubation in OR  Informed Consent: I have reviewed the patients History and Physical, chart, labs and discussed the procedure including the risks, benefits and alternatives for the proposed anesthesia with the patient or authorized representative who has indicated his/her understanding and acceptance.   Dental advisory given  Plan Discussed with: CRNA  Anesthesia Plan Comments:        Anesthesia Quick Evaluation

## 2012-04-24 NOTE — Anesthesia Procedure Notes (Addendum)
Procedure Name: Intubation Date/Time: 04/24/2012 1:04 PM Performed by: Norva Pavlov Pre-anesthesia Checklist: Patient identified, Emergency Drugs available, Suction available and Patient being monitored Patient Re-evaluated:Patient Re-evaluated prior to inductionOxygen Delivery Method: Circle System Utilized Preoxygenation: Pre-oxygenation with 100% oxygen Intubation Type: IV induction Ventilation: Mask ventilation without difficulty Laryngoscope Size: Mac and 4 Tube type: Oral Tube size: 7.0 mm Number of attempts: 1 Airway Equipment and Method: stylet and LTA kit utilized Placement Confirmation: ETT inserted through vocal cords under direct vision,  positive ETCO2 and breath sounds checked- equal and bilateral Secured at: 22 cm Tube secured with: Tape Dental Injury: Teeth and Oropharynx as per pre-operative assessment    Performed by: Huel Coventry G Ventilation: Oral airway inserted - appropriate to patient size

## 2012-04-24 NOTE — Brief Op Note (Signed)
04/24/2012  2:01 PM  PATIENT:  Julia West  57 y.o. female  PRE-OPERATIVE DIAGNOSIS:  NON-HEALING SURGICAL WOUND   POST-OPERATIVE DIAGNOSIS:  Non healing surgical wound  PROCEDURE:  Procedure(s) with comments: IRRIGATION AND DEBRIDEMENT WOUND (N/A) - I & D ABDOMINAL WOUND WITH VAC AND ACELL  SURGEON:  Surgeon(s) and Role:    * Tour manager, DO - Primary  PHYSICIAN ASSISTANT: Shawn Rayburn, PA  ASSISTANTS: none   ANESTHESIA:   general  EBL:  Total I/O In: 1000 [I.V.:1000] Out: -   BLOOD ADMINISTERED:none  DRAINS: none   LOCAL MEDICATIONS USED:  NONE  SPECIMEN:  Source of Specimen:  abdominal ulcer  DISPOSITION OF SPECIMEN:  micro  COUNTS:  YES  TOURNIQUET:  * No tourniquets in log *  DICTATION: dictation  PLAN OF CARE: Discharge to home after PACU  PATIENT DISPOSITION:  PACU - hemodynamically stable.   Delay start of Pharmacological VTE agent (>24hrs) due to surgical blood loss or risk of bleeding: no

## 2012-04-24 NOTE — Transfer of Care (Signed)
Immediate Anesthesia Transfer of Care Note  Patient: Julia West  Procedure(s) Performed: Procedure(s) (LRB): IRRIGATION AND DEBRIDEMENT WOUND (N/A)  Patient Location: PACU  Anesthesia Type: General  Level of Consciousness: sleepy  Airway & Oxygen Therapy: Patient Spontanous Breathing and Patient connected to face mask oxygen, oral airway in place.  Post-op Assessment: Report given to PACU RN and Post -op Vital signs reviewed and stable  Post vital signs: Reviewed and stable  Complications: No apparent anesthesia complications

## 2012-04-24 NOTE — Interval H&P Note (Signed)
History and Physical Interval Note:  04/24/2012 8:45 AM  Julia West  has presented today for surgery, with the diagnosis of NON-HEALING SURGICAL WOUND   The various methods of treatment have been discussed with the patient and family. After consideration of risks, benefits and other options for treatment, the patient has consented to  Procedure(s) with comments: IRRIGATION AND DEBRIDEMENT WOUND (N/A) - I & D ABDOMINAL WOUND WITH VAC AND ACELL as a surgical intervention .  The patient's history has been reviewed, patient examined, no change in status, stable for surgery.  I have reviewed the patient's chart and labs.  Questions were answered to the patient's satisfaction.     SANGER,CLAIRE

## 2012-04-25 ENCOUNTER — Encounter (HOSPITAL_BASED_OUTPATIENT_CLINIC_OR_DEPARTMENT_OTHER): Payer: Self-pay | Admitting: Plastic Surgery

## 2012-04-26 ENCOUNTER — Encounter (HOSPITAL_BASED_OUTPATIENT_CLINIC_OR_DEPARTMENT_OTHER): Payer: Self-pay | Admitting: *Deleted

## 2012-04-26 NOTE — Op Note (Signed)
NAME:  Julia West, Julia West NO.:  [id number]  MEDICAL RECORD NO.:  [id number]  LOCATION:[facility]   FACILITY:  [facility]  PHYSICIAN:  Wayland Denis, DO      DATE OF BIRTH:  04-Apr-1955  DATE OF PROCEDURE:  04/24/2012 DATE OF DISCHARGE:                              OPERATIVE REPORT   PREOPERATIVE DIAGNOSIS:  Large abdominal wall ulcer.  POSTOPERATIVE DIAGNOSIS:  Large abdominal wall ulcer.  PROCEDURE:  Irrigation and debridement of skin, subcutaneous tissue, and fat of large abdominal ulcer for purpose of preparations for placement of ACell and VAC 20 x 20 cm x 7 cm.  ATTENDING SURGEON:  Wayland Denis, DO  ASSISTANT:  Lazaro Arms, P.A.  ANESTHESIA:  General.  INDICATION FOR PROCEDURE:  The patient is a 57 year old white female who had hernia repaired and since then has undergone necrosis of her abdominal wall of the skin and fat area.  She has had multiple debridements by General Surgery and one debridement by Plastic Surgery 1 week ago.  She was treated with debridement, ACell placement, and VAC. She presents for more treatment and risks and complications were reviewed.  The patient wished to proceed.  DESCRIPTION OF PROCEDURE:  The patient was taken to the operating room, placed on the operating room table in a supine position.  General anesthesia was administered.  Once adequate, a time-out was called and all information was confirmed to be correct.  She was prepped and draped in the usual sterile fashion.  The 10-blade along with tenotomies were used to debride the skin and subcutaneous tissue and fat in all directions.  There was approximately 250 mL worth of nonviable fat that was necrotic that we removed.  The ACell sheet that had been placed on the abdominal wall had incorporated, so that encouraging.  The pocket was irrigated with copious amounts of antibiotic solution and warm saline.  Hemostasis was achieved, were needed  with electrocautery. ACell powder 3 g and 1 ACell sheet was placed again on the abdominal wall.  Adaptic was then applied with Hydrogel and VAC sponge.  The VAC was set at 75 mmHg.  The patient tolerated the procedure well.  There were no complications.  She was allowed to wake up, extubated, and taken to recovery room in stable condition.    Wayland Denis, DO    CS/MEDQ  D:  04/25/2012  T:  04/25/2012  Job:  [id number]

## 2012-04-26 NOTE — Progress Notes (Signed)
NPO AFTER MN. ARRIVES AT 1115. NEEDS ISTAT. CURRENT EKG IN EPIC AND CHART. WILL TAKE METOPROLOL AM OF SURG. W/ SIP OF WATER.

## 2012-04-27 LAB — WOUND CULTURE

## 2012-04-28 ENCOUNTER — Telehealth: Payer: Self-pay | Admitting: Physician Assistant

## 2012-04-28 ENCOUNTER — Other Ambulatory Visit: Payer: Self-pay | Admitting: Plastic Surgery

## 2012-04-28 DIAGNOSIS — L98499 Non-pressure chronic ulcer of skin of other sites with unspecified severity: Secondary | ICD-10-CM

## 2012-04-28 NOTE — Telephone Encounter (Signed)
Ordered Doxycycline 100mg  BID x 10 days Called in to [facility] [place], [place] @[address] [phone]

## 2012-04-29 LAB — ANAEROBIC CULTURE: Gram Stain: NONE SEEN

## 2012-05-01 ENCOUNTER — Ambulatory Visit (HOSPITAL_BASED_OUTPATIENT_CLINIC_OR_DEPARTMENT_OTHER)
Admission: RE | Admit: 2012-05-01 | Discharge: 2012-05-01 | Disposition: A | Discharge status: Home / Self Care | Payer: Medicare Other | Source: Ambulatory Visit | Attending: Plastic Surgery | Admitting: Plastic Surgery

## 2012-05-01 ENCOUNTER — Ambulatory Visit (HOSPITAL_BASED_OUTPATIENT_CLINIC_OR_DEPARTMENT_OTHER): Payer: Medicare Other | Admitting: Anesthesiology

## 2012-05-01 ENCOUNTER — Other Ambulatory Visit: Payer: Self-pay | Admitting: Physician Assistant

## 2012-05-01 ENCOUNTER — Telehealth: Payer: Self-pay | Admitting: Cardiology

## 2012-05-01 ENCOUNTER — Encounter (HOSPITAL_BASED_OUTPATIENT_CLINIC_OR_DEPARTMENT_OTHER)
Admission: RE | Disposition: A | Discharge status: Home / Self Care | Payer: Self-pay | Source: Ambulatory Visit | Attending: Plastic Surgery

## 2012-05-01 ENCOUNTER — Encounter (HOSPITAL_BASED_OUTPATIENT_CLINIC_OR_DEPARTMENT_OTHER): Payer: Self-pay | Admitting: Anesthesiology

## 2012-05-01 ENCOUNTER — Encounter (HOSPITAL_BASED_OUTPATIENT_CLINIC_OR_DEPARTMENT_OTHER): Payer: Self-pay | Admitting: Plastic Surgery

## 2012-05-01 DIAGNOSIS — E785 Hyperlipidemia, unspecified: Secondary | ICD-10-CM | POA: Insufficient documentation

## 2012-05-01 DIAGNOSIS — L98499 Non-pressure chronic ulcer of skin of other sites with unspecified severity: Secondary | ICD-10-CM

## 2012-05-01 DIAGNOSIS — I10 Essential (primary) hypertension: Secondary | ICD-10-CM | POA: Insufficient documentation

## 2012-05-01 HISTORY — PX: INCISION AND DRAINAGE OF WOUND: SHX1803

## 2012-05-01 LAB — POCT I-STAT 4, (NA,K, GLUC, HGB,HCT): Potassium: 3.7 mEq/L (ref 3.5–5.1)

## 2012-05-01 SURGERY — IRRIGATION AND DEBRIDEMENT WOUND
Anesthesia: General | Site: Abdomen | Wound class: Contaminated

## 2012-05-01 MED ORDER — HYDROMORPHONE HCL PF 1 MG/ML IJ SOLN
0.2500 mg | INTRAMUSCULAR | Status: DC | PRN
  Filled 2012-05-01: qty 1

## 2012-05-01 MED ORDER — LACTATED RINGERS IV SOLN
INTRAVENOUS | Status: DC
  Administered 2012-05-01 (×2): via INTRAVENOUS
  Filled 2012-05-01: qty 1000

## 2012-05-01 MED ORDER — SODIUM CHLORIDE 0.9 % IR SOLN
Status: DC | PRN
  Administered 2012-05-01: 14:00:00

## 2012-05-01 MED ORDER — 0.9 % SODIUM CHLORIDE (POUR BTL) OPTIME
TOPICAL | Status: DC | PRN
  Administered 2012-05-01: 4000 mL

## 2012-05-01 MED ORDER — ONDANSETRON HCL 4 MG/2ML IJ SOLN
INTRAMUSCULAR | Status: DC | PRN
  Administered 2012-05-01: 4 mg via INTRAVENOUS

## 2012-05-01 MED ORDER — LIDOCAINE HCL (CARDIAC) 20 MG/ML IV SOLN
INTRAVENOUS | Status: DC | PRN
  Administered 2012-05-01: 100 mg via INTRAVENOUS

## 2012-05-01 MED ORDER — DEXAMETHASONE SODIUM PHOSPHATE 4 MG/ML IJ SOLN
INTRAMUSCULAR | Status: DC | PRN
  Administered 2012-05-01: 10 mg via INTRAVENOUS

## 2012-05-01 MED ORDER — SUCCINYLCHOLINE CHLORIDE 20 MG/ML IJ SOLN
INTRAMUSCULAR | Status: DC | PRN
  Administered 2012-05-01: 20 mg via INTRAVENOUS

## 2012-05-01 MED ORDER — OXYCODONE HCL 5 MG/5ML PO SOLN
5.0000 mg | Freq: Once | ORAL | Status: DC | PRN
  Filled 2012-05-01: qty 5

## 2012-05-01 MED ORDER — MIDAZOLAM HCL 5 MG/5ML IJ SOLN
INTRAMUSCULAR | Status: DC | PRN
  Administered 2012-05-01: 2 mg via INTRAVENOUS

## 2012-05-01 MED ORDER — ACETAMINOPHEN 10 MG/ML IV SOLN
INTRAVENOUS | Status: DC | PRN
  Administered 2012-05-01: 1000 mg via INTRAVENOUS

## 2012-05-01 MED ORDER — ACETAMINOPHEN 10 MG/ML IV SOLN
1000.0000 mg | Freq: Once | INTRAVENOUS | Status: DC | PRN
  Filled 2012-05-01: qty 100

## 2012-05-01 MED ORDER — DEXTROSE 5 % IV SOLN: 3.0000 g | INTRAVENOUS | Status: DC | PRN

## 2012-05-01 MED ORDER — OXYCODONE HCL 5 MG/5ML PO SOLN
5.0000 mg | Freq: Once | ORAL | Status: AC | PRN
  Filled 2012-05-01: qty 5

## 2012-05-01 MED ORDER — MEPERIDINE HCL 25 MG/ML IJ SOLN
6.2500 mg | INTRAMUSCULAR | Status: DC | PRN
  Filled 2012-05-01: qty 1

## 2012-05-01 MED ORDER — OXYCODONE HCL 5 MG PO TABS
5.0000 mg | ORAL_TABLET | Freq: Once | ORAL | Status: AC | PRN
  Administered 2012-05-01: 5 mg via ORAL
  Filled 2012-05-01: qty 1

## 2012-05-01 MED ORDER — KETOROLAC TROMETHAMINE 30 MG/ML IJ SOLN
INTRAMUSCULAR | Status: DC | PRN
  Administered 2012-05-01: 30 mg via INTRAVENOUS

## 2012-05-01 MED ORDER — PROPOFOL 10 MG/ML IV BOLUS
INTRAVENOUS | Status: DC | PRN
  Administered 2012-05-01: 100 mg via INTRAVENOUS
  Administered 2012-05-01: 200 mg via INTRAVENOUS

## 2012-05-01 MED ORDER — METOCLOPRAMIDE HCL 5 MG/ML IJ SOLN
10.0000 mg | Freq: Once | INTRAMUSCULAR | Status: DC | PRN
  Filled 2012-05-01: qty 2

## 2012-05-01 MED ORDER — PROMETHAZINE HCL 25 MG/ML IJ SOLN
6.2500 mg | INTRAMUSCULAR | Status: DC | PRN
  Filled 2012-05-01: qty 1

## 2012-05-01 MED ORDER — FENTANYL CITRATE 0.05 MG/ML IJ SOLN
25.0000 ug | INTRAMUSCULAR | Status: DC | PRN
  Filled 2012-05-01: qty 1

## 2012-05-01 MED ORDER — FENTANYL CITRATE 0.05 MG/ML IJ SOLN
INTRAMUSCULAR | Status: DC | PRN
  Administered 2012-05-01 (×6): 50 ug via INTRAVENOUS

## 2012-05-01 MED ORDER — OXYCODONE HCL 5 MG PO TABS
5.0000 mg | ORAL_TABLET | Freq: Once | ORAL | Status: DC | PRN
  Filled 2012-05-01: qty 1

## 2012-05-01 MED ORDER — DEXTROSE 5 % IV SOLN
3.0000 g | INTRAVENOUS | Status: DC | PRN
  Administered 2012-05-01: 3 g via INTRAVENOUS

## 2012-05-01 SURGICAL SUPPLY — 91 items
BAG DECANTER FOR FLEXI CONT (MISCELLANEOUS) IMPLANT
BANDAGE ELASTIC 3 VELCRO ST LF (GAUZE/BANDAGES/DRESSINGS) IMPLANT
BANDAGE ELASTIC 4 VELCRO ST LF (GAUZE/BANDAGES/DRESSINGS) IMPLANT
BANDAGE ELASTIC 6 VELCRO ST LF (GAUZE/BANDAGES/DRESSINGS) IMPLANT
BANDAGE GAUZE ELAST BULKY 4 IN (GAUZE/BANDAGES/DRESSINGS) IMPLANT
BENZOIN TINCTURE PRP APPL 2/3 (GAUZE/BANDAGES/DRESSINGS) IMPLANT
BLADE MINI RND TIP GREEN BEAV (BLADE) IMPLANT
BLADE SURG 10 STRL SS (BLADE) IMPLANT
BLADE SURG 15 STRL LF DISP TIS (BLADE) ×2 IMPLANT
BLADE SURG 15 STRL SS (BLADE) ×2
BNDG COHESIVE 1X5 TAN STRL LF (GAUZE/BANDAGES/DRESSINGS) IMPLANT
BNDG COHESIVE 4X5 TAN NS LF (GAUZE/BANDAGES/DRESSINGS) IMPLANT
BNDG ESMARK 4X9 LF (GAUZE/BANDAGES/DRESSINGS) IMPLANT
CANISTER OMNI JUG 16 LITER (MISCELLANEOUS) IMPLANT
CANISTER SUCTION 1200CC (MISCELLANEOUS) IMPLANT
CANISTER SUCTION 2500CC (MISCELLANEOUS) ×4 IMPLANT
CHLORAPREP W/TINT 26ML (MISCELLANEOUS) IMPLANT
CLOTH BEACON ORANGE TIMEOUT ST (SAFETY) ×2 IMPLANT
CORDS BIPOLAR (ELECTRODE) IMPLANT
COVER MAYO STAND STRL (DRAPES) ×2 IMPLANT
COVER TABLE BACK 60X90 (DRAPES) ×2 IMPLANT
DECANTER SPIKE VIAL GLASS SM (MISCELLANEOUS) IMPLANT
DRAIN PENROSE 18X1/2 LTX STRL (DRAIN) IMPLANT
DRAPE EXTREMITY T 121X128X90 (DRAPE) IMPLANT
DRAPE INCISE IOBAN 66X45 STRL (DRAPES) ×2 IMPLANT
DRAPE LG THREE QUARTER DISP (DRAPES) IMPLANT
DRSG ADAPTIC 3X8 NADH LF (GAUZE/BANDAGES/DRESSINGS) ×6 IMPLANT
DRSG EMULSION OIL 3X3 NADH (GAUZE/BANDAGES/DRESSINGS) IMPLANT
DRSG PAD ABDOMINAL 8X10 ST (GAUZE/BANDAGES/DRESSINGS) IMPLANT
ELECT NEEDLE TIP 2.8 STRL (NEEDLE) IMPLANT
ELECT REM PT RETURN 9FT ADLT (ELECTROSURGICAL) ×2
ELECTRODE REM PT RTRN 9FT ADLT (ELECTROSURGICAL) ×1 IMPLANT
GAUZE SPONGE 4X4 12PLY STRL LF (GAUZE/BANDAGES/DRESSINGS) ×2 IMPLANT
GAUZE XEROFORM 1X8 LF (GAUZE/BANDAGES/DRESSINGS) IMPLANT
GAUZE XEROFORM 5X9 LF (GAUZE/BANDAGES/DRESSINGS) IMPLANT
GLOVE BIO SURGEON STRL SZ 6.5 (GLOVE) ×2 IMPLANT
GLOVE BIOGEL M STER SZ 6 (GLOVE) ×6 IMPLANT
GLOVE ECLIPSE 6.0 STRL STRAW (GLOVE) ×4 IMPLANT
GOWN PREVENTION PLUS XLARGE (GOWN DISPOSABLE) ×2 IMPLANT
GOWN SURGICAL LARGE (GOWNS) ×2 IMPLANT
HANDPIECE INTERPULSE COAX TIP (DISPOSABLE)
IV NS IRRIG 3000ML ARTHROMATIC (IV SOLUTION) IMPLANT
MATRIX SURGICAL PSMX 10X15CM (Tissue) ×2 IMPLANT
MICROMATRIX 1000MG (Tissue) ×2 IMPLANT
MICROMATRIX 500MG (Tissue) ×8 IMPLANT
NEEDLE 27GAX1X1/2 (NEEDLE) IMPLANT
NEEDLE HYPO 30GX1 BEV (NEEDLE) IMPLANT
NS IRRIG 1000ML POUR BTL (IV SOLUTION) ×8 IMPLANT
PACK BASIN DAY SURGERY FS (CUSTOM PROCEDURE TRAY) ×2 IMPLANT
PADDING CAST ABS 3INX4YD NS (CAST SUPPLIES)
PADDING CAST ABS 4INX4YD NS (CAST SUPPLIES)
PADDING CAST ABS COTTON 3X4 (CAST SUPPLIES) IMPLANT
PADDING CAST ABS COTTON 4X4 ST (CAST SUPPLIES) IMPLANT
PENCIL BUTTON HOLSTER BLD 10FT (ELECTRODE) ×2 IMPLANT
SET HNDPC FAN SPRY TIP SCT (DISPOSABLE) IMPLANT
SLEEVE SCD COMPRESS KNEE MED (MISCELLANEOUS) IMPLANT
SOLUTION PARTIC MCRMTRX 1000MG (Tissue) ×1 IMPLANT
SOLUTION PARTIC MCRMTRX 500MG (Tissue) ×4 IMPLANT
SPLINT PLASTER CAST XFAST 3X15 (CAST SUPPLIES) IMPLANT
SPLINT PLASTER XTRA FASTSET 3X (CAST SUPPLIES)
SPONGE GAUZE 4X4 12PLY (GAUZE/BANDAGES/DRESSINGS) ×2 IMPLANT
SPONGE LAP 18X18 X RAY DECT (DISPOSABLE) ×4 IMPLANT
SPONGE LAP 4X18 X RAY DECT (DISPOSABLE) IMPLANT
STAPLER VISISTAT 35W (STAPLE) IMPLANT
STOCKINETTE 4X48 STRL (DRAPES) IMPLANT
STOCKINETTE 6  STRL (DRAPES) ×1
STOCKINETTE 6 STRL (DRAPES) ×1 IMPLANT
STOCKINETTE IMPERVIOUS LG (DRAPES) IMPLANT
STRIP CLOSURE SKIN 1/2X4 (GAUZE/BANDAGES/DRESSINGS) IMPLANT
SUCTION FRAZIER TIP 10 FR DISP (SUCTIONS) IMPLANT
SURGILUBE 2OZ TUBE FLIPTOP (MISCELLANEOUS) ×4 IMPLANT
SUT ETHILON 3 0 PS 1 (SUTURE) IMPLANT
SUT ETHILON 4 0 P 3 18 (SUTURE) IMPLANT
SUT ETHILON 5 0 PS 2 18 (SUTURE) IMPLANT
SUT PROLENE 3 0 PS 2 (SUTURE) IMPLANT
SUT SILK 3 0 PS 1 (SUTURE) IMPLANT
SUT VIC AB 3-0 FS2 27 (SUTURE) IMPLANT
SUT VIC AB 5-0 P-3 18X BRD (SUTURE) ×1 IMPLANT
SUT VIC AB 5-0 P3 18 (SUTURE) ×1
SUT VIC AB 5-0 PS2 18 (SUTURE) IMPLANT
SYR BULB IRRIGATION 50ML (SYRINGE) IMPLANT
SYR CONTROL 10ML LL (SYRINGE) IMPLANT
TAPE HYPAFIX 6X30 (GAUZE/BANDAGES/DRESSINGS) IMPLANT
TIP FLEX 45CM EVICEL (HEMOSTASIS) IMPLANT
TIP RIGID 35CM EVICEL (HEMOSTASIS) IMPLANT
TOWEL OR 17X24 6PK STRL BLUE (TOWEL DISPOSABLE) ×4 IMPLANT
TRAY DSU PREP LF (CUSTOM PROCEDURE TRAY) ×2 IMPLANT
TUBE CONNECTING 12X1/4 (SUCTIONS) ×2 IMPLANT
UNDERPAD 30X30 INCONTINENT (UNDERPADS AND DIAPERS) ×2 IMPLANT
WATER STERILE IRR 1000ML POUR (IV SOLUTION) ×2 IMPLANT
YANKAUER SUCT BULB TIP NO VENT (SUCTIONS) IMPLANT

## 2012-05-01 NOTE — Telephone Encounter (Signed)
Pt's daughter called to let Dr. Antoine Poche know, that pt had a hernia repair in October 2013. At the time of the surgery the surgeon found that part of pt's bowel was necrotic; so part of the bowel was removed.  A week after the surgery  the wound became open, and was infected, after that pt's wound had problems healing ,then pt had to have a wound vac. Today pt is going to surgery which is her third time pt has gone under anesthesia, to have some necrotic tissue removed. Pt and daughter are concern and would like for Dr. Antoine Poche to know what is going on with pt.

## 2012-05-01 NOTE — Transfer of Care (Signed)
Immediate Anesthesia Transfer of Care Note  Patient: Julia West  Procedure(s) Performed: Procedure(s) with comments: IRRIGATION AND DEBRIDEMENT WOUND (N/A) - WITH SURGICAL PREP AND PLACEMENT OF VAC  Patient Location: PACU  Anesthesia Type:General  Level of Consciousness: awake, alert  and oriented  Airway & Oxygen Therapy: Patient Spontanous Breathing and Patient connected to nasal cannula oxygen  Post-op Assessment: Report given to PACU RN  Post vital signs: Reviewed and stable  Complications: No apparent anesthesia complications

## 2012-05-01 NOTE — Interval H&P Note (Signed)
History and Physical Interval Note:  05/01/2012 9:54 AM  Julia West  has presented today for surgery, with the diagnosis of ABDOMINAL WOUND  The various methods of treatment have been discussed with the patient and family. After consideration of risks, benefits and other options for treatment, the patient has consented to  Procedure(s) with comments: IRRIGATION AND DEBRIDEMENT WOUND (N/A) - WITH SURGICAL PREP AND PLACEMENT OF VAC as a surgical intervention .  The patient's history has been reviewed, patient examined, no change in status, stable for surgery.  I have reviewed the patient's chart and labs.  Questions were answered to the patient's satisfaction.     SANGER,Ibrahim Mcpheeters

## 2012-05-01 NOTE — Telephone Encounter (Signed)
Pt is having out pt surgeries for a wound and they are happening weekly and daughter is concerned and has questions as well as pt. She is having surgery today at 12noon and her daughter's name is Jeoffrey Massed [phone]

## 2012-05-01 NOTE — H&P (View-Only) (Signed)
Julia West is an 57 y.o. female.   Chief Complaint: Large abdominal ulcer HPI: The patient is a 57 yrs old wf here for treatment of her large abdominal ulcer.  She had a hernia that was treated and underwent a laparotomy.  Post operatively she had loss of a large area of the periumbilical area that is full thickness.  She underwent debridement in the last few weeks.  She has remaining necrotic tissue in the abdominal wall.  The base of he ulcer has granulating tissue.  Past Medical History  Diagnosis Date  . Morbid obesity   . Other and unspecified hyperlipidemia   . Unspecified essential hypertension   . Acute myocardial infarction     with a ruptured plaque in the circumflex in 2003  . Degenerative joint disease   . Internal and external hemorrhoids without complication   . Anxiety   . Coronary artery disease   . Complication of anesthesia     states low O2 sats post-op 11/13  . Skin lesion     forehead    Past Surgical History  Procedure Date  . Cesarean section     x 4 in remote past  . Cholecystectomy   . Coronary artery bypass graft 2003    by Dr. Gerhardt. LIMA to the LAD, free RIMA to the circumflex. Stress perfusion study December 2009 with no high-risk areas of ischemia. She has a well-preserved ejection fraction  . Incisional hernia repair 02/07/2012    Procedure: HERNIA REPAIR INCISIONAL;  Surgeon: Eric M Wilson, MD,FACS;  Location: [facility] OR;  Service: General;;  Open, Primary repair, strangulated Incisional hernia.  . Bowel resection 02/07/2012    Procedure: SMALL BOWEL RESECTION;  Surgeon: Eric M Wilson, MD,FACS;  Location: [facility] OR;  Service: General;;  . Application of wound vac     Family History  Problem Relation Age of Onset  . Heart attack Mother   . Hypertension Sister    Social History:  reports that she quit smoking about 4 months ago. Her smoking use included Cigarettes. She has a 25 pack-year smoking history. She does not have any smokeless tobacco  history on file. She reports that she does not drink alcohol or use illicit drugs.  Allergies:  Allergies  Allergen Reactions  . Ace Inhibitors Cough     (Not in a hospital admission)  No results found for this or any previous visit (from the past 48 hour(s)). No results found.  Review of Systems  Constitutional: Negative.   HENT: Negative.   Eyes: Negative.   Respiratory: Negative.   Cardiovascular: Negative.   Gastrointestinal: Negative.   Genitourinary: Negative.   Musculoskeletal: Negative.   Skin: Negative.   Neurological: Negative.   Psychiatric/Behavioral: Negative.     There were no vitals taken for this visit. Physical Exam  Constitutional: She appears well-developed and well-nourished.  HENT:  Head: Normocephalic and atraumatic.  Eyes: Conjunctivae normal and EOM are normal. Pupils are equal, round, and reactive to light.  Neck: Normal range of motion.  Cardiovascular: Normal rate.   Respiratory: Effort normal. No respiratory distress. She has no wheezes.  GI: Soft. There is tenderness.    Musculoskeletal: Normal range of motion.  Neurological: She is alert.  Skin: Skin is warm.  Psychiatric: She has a normal mood and affect. Her behavior is normal. Judgment and thought content normal.     Assessment/Plan Large abdominal ulcer - plan for excision and debridement of abdominal ulcer with placement of Acell and the   VAC. Risks and complications were discussed.  SANGER,CLAIRE 04/12/2012, 4:19 PM    

## 2012-05-01 NOTE — Anesthesia Postprocedure Evaluation (Signed)
Anesthesia Post Note  Patient: Julia West  Procedure(s) Performed: Procedure(s) (LRB): IRRIGATION AND DEBRIDEMENT WOUND (N/A)  Anesthesia type: General  Patient location: PACU  Post pain: Pain level controlled  Post assessment: Patient's Cardiovascular Status Stable  Last Vitals:  Filed Vitals:   05/01/12 1500  BP: 113/46  Pulse:   Temp:   Resp:     Post vital signs: Reviewed and stable  Level of consciousness: alert  Complications: No apparent anesthesia complications

## 2012-05-01 NOTE — Op Note (Signed)
NAME:  Julia West, Julia West NO.:  [id number]  MEDICAL RECORD NO.:  [id number]  LOCATION:[facility]      FACILITY:  [facility]  PHYSICIAN:  Wayland Denis, DO      DATE OF BIRTH:  29-Mar-1955  DATE OF PROCEDURE:  05/01/2012 DATE OF DISCHARGE:                              OPERATIVE REPORT   This was done at the [facility].  PREOPERATIVE DIAGNOSIS:  Large abdominal ulcer.  POSTOPERATIVE DIAGNOSIS:  Large abdominal ulcer.  PROCEDURE:  Irrigation and debridement of large abdominal ulcer, adipose excised for purpose of preparation for placement of ACell and the VAC 21 x 21 cm.  ATTENDING SURGEON:  Wayland Denis, DO.  ASSISTANT:  Shawn Rayburn, P.A.  ANESTHESIA:  General.  INDICATION FOR PROCEDURE:  The patient is a 57 year old female, who has undergone multiple debridements of her abdominal ulcer in the past.  She presents for further debridement.  Risks and complications were reviewed and included bleeding, pain, scar, risk of anesthesia.  DESCRIPTION OF PROCEDURE:  The patient was taken to the operating room and placed on the operating room table in a supine position.  General anesthesia was administered.  Once adequate, a time-out was called and all information was confirmed to be correct.  She was prepped and draped in the usual sterile fashion.  The Adaptic was removed that had been previously placed.  She had complete incorporation of the ACell sheet and powder that had been placed 1 week ago.  After the patient was prepped and draped, then a time-out was called.  Copious amounts of irrigation with antibiotic solution and normal saline was used to irrigate the wound.  Further debridement was done with a pair of tenotomies to remove some nonviable fat particularly along the undersurface of the skin.  A significant reduction of debridement was done compared to 1 week ago.  After hemostasis was obtained  with electrocautery, 3 g of ACell powder was placed in the wound followed by an ACell sheet.  The sheet was tacked in place with 5-0 Vicryl.  Adaptic was applied followed by K-Y Jelly.  VAC sponge was placed.  Two sutures of 2-0 nylon were used to keep the skin edges from separating further and just provide small midline tension.  The Ioban was then placed and the [facility] was set at 124 mmHg.  The patient tolerated the procedure well.  There were no complications.  She was allowed to wake up, extubated, and taken to recovery in stable condition.     Wayland Denis, DO     CS/MEDQ  D:  05/01/2012  T:  05/01/2012  Job:  [id number]

## 2012-05-01 NOTE — Anesthesia Procedure Notes (Signed)
Procedure Name: Intubation Date/Time: 05/01/2012 1:12 PM Performed by: Maris Berger T Pre-anesthesia Checklist: Patient identified, Emergency Drugs available, Suction available and Patient being monitored Patient Re-evaluated:Patient Re-evaluated prior to inductionOxygen Delivery Method: Circle System Utilized Preoxygenation: Pre-oxygenation with 100% oxygen Intubation Type: IV induction Ventilation: Mask ventilation without difficulty Laryngoscope Size: Mac and 3 Grade View: Grade I Tube type: Oral Tube size: 7.0 mm Number of attempts: 1 Airway Equipment and Method: stylet and oral airway Placement Confirmation: ETT inserted through vocal cords under direct vision,  positive ETCO2 and breath sounds checked- equal and bilateral Secured at: 22 cm Tube secured with: Tape Dental Injury: Teeth and Oropharynx as per pre-operative assessment

## 2012-05-01 NOTE — Brief Op Note (Signed)
05/01/2012  2:05 PM  PATIENT:  Julia West  57 y.o. female  PRE-OPERATIVE DIAGNOSIS:  ABDOMINAL WOUND  POST-OPERATIVE DIAGNOSIS:  ABDOMINAL WOUND  PROCEDURE:  Procedure(s) with comments: IRRIGATION AND DEBRIDEMENT WOUND (N/A) - WITH SURGICAL PREP AND PLACEMENT OF VAC  SURGEON:  Surgeon(s) and Role:    * Claire Sanger, DO - Primary  PHYSICIAN ASSISTANT: Shawn Rayburn, PA  ASSISTANTS: none   ANESTHESIA:   general  EBL:  Total I/O In: 200 [I.V.:200] Out: -   BLOOD ADMINISTERED:none  DRAINS: none   LOCAL MEDICATIONS USED:  NONE  SPECIMEN:  No Specimen  DISPOSITION OF SPECIMEN:  N/A  COUNTS:  YES  TOURNIQUET:  * No tourniquets in log *  DICTATION: dictated  PLAN OF CARE: Discharge to home after PACU  PATIENT DISPOSITION:  PACU - hemodynamically stable.   Delay start of Pharmacological VTE agent (>24hrs) due to surgical blood loss or risk of bleeding: no

## 2012-05-01 NOTE — Anesthesia Preprocedure Evaluation (Addendum)
Anesthesia Evaluation  Patient identified by MRN, date of birth, ID band Patient awake    Reviewed: Allergy & Precautions, H&P , NPO status , Patient's Chart, lab work & pertinent test results, reviewed documented beta blocker date and time   History of Anesthesia Complications Negative for: history of anesthetic complications  Airway Mallampati: II TM Distance: >3 FB Neck ROM: full    Dental no notable dental hx. (+) Missing, Edentulous Upper and Dental Advisory Given,    Pulmonary former smoker,  Low )2 sats post op 11/13 breath sounds clear to auscultation  Pulmonary exam normal       Cardiovascular hypertension, Pt. on home beta blockers and Pt. on medications + CAD, + Past MI and + CABG Rhythm:regular Rate:Normal  AMI circumflex 2003   Neuro/Psych Anxiety negative neurological ROS     GI/Hepatic negative GI ROS, Neg liver ROS,   Endo/Other  Morbid obesity  Renal/GU negative Renal ROS  negative genitourinary   Musculoskeletal   Abdominal (+) + obese,   Peds  Hematology negative hematology ROS (+)   Anesthesia Other Findings   Reproductive/Obstetrics negative OB ROS                          Anesthesia Physical  Anesthesia Plan  ASA: III  Anesthesia Plan: General   Post-op Pain Management:    Induction: Intravenous  Airway Management Planned: Oral ETT  Additional Equipment:   Intra-op Plan:   Post-operative Plan: Extubation in OR  Informed Consent: I have reviewed the patients History and Physical, chart, labs and discussed the procedure including the risks, benefits and alternatives for the proposed anesthesia with the patient or authorized representative who has indicated his/her understanding and acceptance.   Dental advisory given  Plan Discussed with: CRNA  Anesthesia Plan Comments:         Anesthesia Quick Evaluation

## 2012-05-02 ENCOUNTER — Encounter (HOSPITAL_BASED_OUTPATIENT_CLINIC_OR_DEPARTMENT_OTHER): Payer: Self-pay | Admitting: Plastic Surgery

## 2012-05-02 NOTE — Telephone Encounter (Signed)
Pt having surgical debridement weekly under anesthesia and just wants to make sure Dr Antoine Poche is OK with this from a cardiac stand point.

## 2012-05-03 ENCOUNTER — Encounter (HOSPITAL_BASED_OUTPATIENT_CLINIC_OR_DEPARTMENT_OTHER): Payer: Self-pay | Admitting: *Deleted

## 2012-05-03 NOTE — Telephone Encounter (Signed)
Pt.notified

## 2012-05-03 NOTE — Progress Notes (Signed)
Pt instructed npo p mn 2/ 23 x lopressor w sip of water.  Ok to take pain med if needed.  To [facility] 2/24 @ 1045.  Needs istat on arrival. Pt states no change in status since last visit.

## 2012-05-03 NOTE — Telephone Encounter (Signed)
Ok

## 2012-05-05 ENCOUNTER — Other Ambulatory Visit: Payer: Self-pay | Admitting: Plastic Surgery

## 2012-05-05 DIAGNOSIS — L98499 Non-pressure chronic ulcer of skin of other sites with unspecified severity: Secondary | ICD-10-CM

## 2012-05-05 NOTE — Progress Notes (Signed)
[facility]  NAME:  SYANNA, REMMERT                ACCOUNT NO.:  MEDICAL RECORD NO.:  [id number]      DATE OF BIRTH:  24-Jun-1955  PHYSICIAN:  Wayland Denis, DO       VISIT DATE:  05/01/2012                                  OFFICE VISIT   Ms. Roettger is a 57 year old white female, who suffers from morbid obesity.  She presents to the office for followup on her large abdominal wound.  She has undergone multiple debridements in the past and is status post debridement today.  She has had ACell and VAC placement on the abdominal wound.  The [facility] is [facility]. On her follow up appointment, she has limited mobility in ambulation, transferring and even changing positions.  Her nutritional status is impaired with a prealbumin of 8.2, and Nutrition consult has been arranged in order to maximize her nutritional status.  Her wound measurements are 20 x 15 x 7 cm and she has patches of the large wound and the smaller wound is 2 x 2 x 2.1.  They are interconnecting underneath.  In addition, she suffers from chronic wound pain due to the ongoing fat necrosis surrounding the actual wound.  At this point, mostly in the lateral positions.  This has impaired her ability to perform her daily activities and making it difficult even for feeding, grooming, dressing, toileting and bathing at home.  The roller walker would provide assistance with getting her out of the bed and getting her more mobile facilitating stability as well as she does her daily activities and prevent a fall which could lead to a hip fracture.  In addition, she would benefit from, and I find it medically necessary for her to have a hospital bed during this period which will help relieve pressure and facilitate changing positions, and hopefully prevent an additional ulcer from pressure of her bed.  A hospital bed and mattress would assist in correct positioning in ways that are not  feasible with a normal bed.  Her pain episodes are infrequent, although improving over the last few days.  There is also a cyst in her ability to position herself in the bed and to actually get out of the bed.     Wayland Denis, DO     CS/MEDQ  D:  05/04/2012  T:  05/04/2012  Job:  [id number]

## 2012-05-08 ENCOUNTER — Ambulatory Visit (HOSPITAL_BASED_OUTPATIENT_CLINIC_OR_DEPARTMENT_OTHER)
Admission: RE | Admit: 2012-05-08 | Discharge: 2012-05-08 | Disposition: A | Discharge status: Home / Self Care | Payer: Medicare Other | Source: Ambulatory Visit | Attending: Plastic Surgery | Admitting: Plastic Surgery

## 2012-05-08 ENCOUNTER — Encounter (HOSPITAL_BASED_OUTPATIENT_CLINIC_OR_DEPARTMENT_OTHER)
Admission: RE | Disposition: A | Discharge status: Home / Self Care | Payer: Self-pay | Source: Ambulatory Visit | Attending: Plastic Surgery

## 2012-05-08 ENCOUNTER — Ambulatory Visit (HOSPITAL_BASED_OUTPATIENT_CLINIC_OR_DEPARTMENT_OTHER): Payer: Medicare Other | Admitting: Anesthesiology

## 2012-05-08 ENCOUNTER — Encounter (HOSPITAL_BASED_OUTPATIENT_CLINIC_OR_DEPARTMENT_OTHER): Payer: Self-pay | Admitting: Anesthesiology

## 2012-05-08 ENCOUNTER — Encounter (HOSPITAL_BASED_OUTPATIENT_CLINIC_OR_DEPARTMENT_OTHER): Payer: Self-pay | Admitting: *Deleted

## 2012-05-08 DIAGNOSIS — Z79899 Other long term (current) drug therapy: Secondary | ICD-10-CM | POA: Insufficient documentation

## 2012-05-08 DIAGNOSIS — I252 Old myocardial infarction: Secondary | ICD-10-CM | POA: Insufficient documentation

## 2012-05-08 DIAGNOSIS — Z6841 Body Mass Index (BMI) 40.0 and over, adult: Secondary | ICD-10-CM | POA: Insufficient documentation

## 2012-05-08 DIAGNOSIS — Z7982 Long term (current) use of aspirin: Secondary | ICD-10-CM | POA: Insufficient documentation

## 2012-05-08 DIAGNOSIS — L98499 Non-pressure chronic ulcer of skin of other sites with unspecified severity: Secondary | ICD-10-CM

## 2012-05-08 DIAGNOSIS — I10 Essential (primary) hypertension: Secondary | ICD-10-CM | POA: Insufficient documentation

## 2012-05-08 DIAGNOSIS — I2510 Atherosclerotic heart disease of native coronary artery without angina pectoris: Secondary | ICD-10-CM | POA: Insufficient documentation

## 2012-05-08 DIAGNOSIS — E785 Hyperlipidemia, unspecified: Secondary | ICD-10-CM | POA: Insufficient documentation

## 2012-05-08 DIAGNOSIS — Z87891 Personal history of nicotine dependence: Secondary | ICD-10-CM | POA: Insufficient documentation

## 2012-05-08 DIAGNOSIS — Z951 Presence of aortocoronary bypass graft: Secondary | ICD-10-CM | POA: Insufficient documentation

## 2012-05-08 HISTORY — PX: INCISION AND DRAINAGE OF WOUND: SHX1803

## 2012-05-08 SURGERY — IRRIGATION AND DEBRIDEMENT WOUND
Anesthesia: General | Site: Abdomen | Wound class: Clean Contaminated

## 2012-05-08 MED ORDER — PROPOFOL 10 MG/ML IV BOLUS
INTRAVENOUS | Status: DC | PRN
  Administered 2012-05-08: 200 mg via INTRAVENOUS
  Administered 2012-05-08: 40 mg via INTRAVENOUS

## 2012-05-08 MED ORDER — FENTANYL CITRATE 0.05 MG/ML IJ SOLN
INTRAMUSCULAR | Status: DC | PRN
  Administered 2012-05-08 (×4): 50 ug via INTRAVENOUS

## 2012-05-08 MED ORDER — DEXTROSE 5 % IV SOLN
3.0000 g | INTRAVENOUS | Status: AC
  Administered 2012-05-08: 2 g via INTRAVENOUS
  Filled 2012-05-08: qty 3000

## 2012-05-08 MED ORDER — KETOROLAC TROMETHAMINE 30 MG/ML IJ SOLN
INTRAMUSCULAR | Status: DC | PRN
  Administered 2012-05-08: 30 mg via INTRAVENOUS

## 2012-05-08 MED ORDER — CEFAZOLIN SODIUM-DEXTROSE 2-3 GM-% IV SOLR
2.0000 g | INTRAVENOUS | Status: DC
  Filled 2012-05-08: qty 50

## 2012-05-08 MED ORDER — EPHEDRINE SULFATE 50 MG/ML IJ SOLN
INTRAMUSCULAR | Status: DC | PRN
  Administered 2012-05-08: 10 mg via INTRAVENOUS

## 2012-05-08 MED ORDER — SODIUM CHLORIDE 0.9 % IR SOLN
Status: DC | PRN
  Administered 2012-05-08: 13:00:00

## 2012-05-08 MED ORDER — FENTANYL CITRATE 0.05 MG/ML IJ SOLN
25.0000 ug | INTRAMUSCULAR | Status: DC | PRN
  Administered 2012-05-08: 25 ug via INTRAVENOUS
  Filled 2012-05-08: qty 1

## 2012-05-08 MED ORDER — SUCCINYLCHOLINE CHLORIDE 20 MG/ML IJ SOLN
INTRAMUSCULAR | Status: DC | PRN
  Administered 2012-05-08: 120 mg via INTRAVENOUS

## 2012-05-08 MED ORDER — LACTATED RINGERS IV SOLN
INTRAVENOUS | Status: DC
  Administered 2012-05-08: 11:00:00 via INTRAVENOUS
  Filled 2012-05-08: qty 1000

## 2012-05-08 MED ORDER — ACETAMINOPHEN 10 MG/ML IV SOLN
INTRAVENOUS | Status: DC | PRN
  Administered 2012-05-08: 1000 mg via INTRAVENOUS

## 2012-05-08 MED ORDER — LACTATED RINGERS IV SOLN
INTRAVENOUS | Status: DC
  Filled 2012-05-08: qty 1000

## 2012-05-08 MED ORDER — HYDROCODONE-ACETAMINOPHEN 5-325 MG PO TABS
1.0000 | ORAL_TABLET | Freq: Four times a day (QID) | ORAL | Status: DC | PRN
  Administered 2012-05-08: 1 via ORAL
  Filled 2012-05-08: qty 1

## 2012-05-08 MED ORDER — MIDAZOLAM HCL 5 MG/5ML IJ SOLN
INTRAMUSCULAR | Status: DC | PRN
  Administered 2012-05-08: 2 mg via INTRAVENOUS

## 2012-05-08 MED ORDER — MEPERIDINE HCL 25 MG/ML IJ SOLN
6.2500 mg | INTRAMUSCULAR | Status: DC | PRN
  Filled 2012-05-08: qty 1

## 2012-05-08 MED ORDER — PROMETHAZINE HCL 25 MG/ML IJ SOLN
6.2500 mg | INTRAMUSCULAR | Status: DC | PRN
  Filled 2012-05-08: qty 1

## 2012-05-08 SURGICAL SUPPLY — 87 items
BAG DECANTER FOR FLEXI CONT (MISCELLANEOUS) IMPLANT
BANDAGE ELASTIC 3 VELCRO ST LF (GAUZE/BANDAGES/DRESSINGS) IMPLANT
BANDAGE ELASTIC 4 VELCRO ST LF (GAUZE/BANDAGES/DRESSINGS) IMPLANT
BANDAGE ELASTIC 6 VELCRO ST LF (GAUZE/BANDAGES/DRESSINGS) IMPLANT
BANDAGE GAUZE ELAST BULKY 4 IN (GAUZE/BANDAGES/DRESSINGS) IMPLANT
BENZOIN TINCTURE PRP APPL 2/3 (GAUZE/BANDAGES/DRESSINGS) IMPLANT
BLADE MINI RND TIP GREEN BEAV (BLADE) IMPLANT
BLADE SURG 10 STRL SS (BLADE) IMPLANT
BLADE SURG 15 STRL LF DISP TIS (BLADE) ×1 IMPLANT
BLADE SURG 15 STRL SS (BLADE) ×1
BNDG COHESIVE 1X5 TAN STRL LF (GAUZE/BANDAGES/DRESSINGS) IMPLANT
BNDG COHESIVE 4X5 TAN NS LF (GAUZE/BANDAGES/DRESSINGS) ×2 IMPLANT
BNDG ESMARK 4X9 LF (GAUZE/BANDAGES/DRESSINGS) IMPLANT
CANISTER OMNI JUG 16 LITER (MISCELLANEOUS) IMPLANT
CANISTER SUCTION 1200CC (MISCELLANEOUS) IMPLANT
CANISTER SUCTION 2500CC (MISCELLANEOUS) IMPLANT
CHLORAPREP W/TINT 26ML (MISCELLANEOUS) IMPLANT
CLOTH BEACON ORANGE TIMEOUT ST (SAFETY) ×2 IMPLANT
CORDS BIPOLAR (ELECTRODE) IMPLANT
COVER MAYO STAND STRL (DRAPES) ×2 IMPLANT
COVER TABLE BACK 60X90 (DRAPES) ×2 IMPLANT
DECANTER SPIKE VIAL GLASS SM (MISCELLANEOUS) IMPLANT
DRAIN PENROSE 18X1/2 LTX STRL (DRAIN) ×2 IMPLANT
DRAPE EXTREMITY T 121X128X90 (DRAPE) IMPLANT
DRAPE INCISE IOBAN 66X45 STRL (DRAPES) IMPLANT
DRAPE LG THREE QUARTER DISP (DRAPES) IMPLANT
DRSG ADAPTIC 3X8 NADH LF (GAUZE/BANDAGES/DRESSINGS) ×2 IMPLANT
DRSG EMULSION OIL 3X3 NADH (GAUZE/BANDAGES/DRESSINGS) IMPLANT
DRSG PAD ABDOMINAL 8X10 ST (GAUZE/BANDAGES/DRESSINGS) IMPLANT
ELECT NEEDLE TIP 2.8 STRL (NEEDLE) IMPLANT
ELECT REM PT RETURN 9FT ADLT (ELECTROSURGICAL) ×2
ELECTRODE REM PT RTRN 9FT ADLT (ELECTROSURGICAL) ×1 IMPLANT
GAUZE SPONGE 4X4 12PLY STRL LF (GAUZE/BANDAGES/DRESSINGS) IMPLANT
GAUZE XEROFORM 1X8 LF (GAUZE/BANDAGES/DRESSINGS) IMPLANT
GAUZE XEROFORM 5X9 LF (GAUZE/BANDAGES/DRESSINGS) IMPLANT
GLOVE BIO SURGEON STRL SZ 6 (GLOVE) ×2 IMPLANT
GLOVE BIO SURGEON STRL SZ 6.5 (GLOVE) ×4 IMPLANT
GOWN PREVENTION PLUS XLARGE (GOWN DISPOSABLE) ×2 IMPLANT
HANDPIECE INTERPULSE COAX TIP (DISPOSABLE)
IV NS IRRIG 3000ML ARTHROMATIC (IV SOLUTION) IMPLANT
MATRIX SURGICAL PSM 10X15CM (Tissue) ×2 IMPLANT
MICROMATRIX 1000MG (Tissue) ×6 IMPLANT
NEEDLE 27GAX1X1/2 (NEEDLE) IMPLANT
NEEDLE HYPO 30GX1 BEV (NEEDLE) IMPLANT
NS IRRIG 1000ML POUR BTL (IV SOLUTION) ×2 IMPLANT
PACK BASIN DAY SURGERY FS (CUSTOM PROCEDURE TRAY) ×2 IMPLANT
PADDING CAST ABS 3INX4YD NS (CAST SUPPLIES)
PADDING CAST ABS 4INX4YD NS (CAST SUPPLIES)
PADDING CAST ABS COTTON 3X4 (CAST SUPPLIES) IMPLANT
PADDING CAST ABS COTTON 4X4 ST (CAST SUPPLIES) IMPLANT
PENCIL BUTTON HOLSTER BLD 10FT (ELECTRODE) IMPLANT
SET HNDPC FAN SPRY TIP SCT (DISPOSABLE) IMPLANT
SLEEVE SCD COMPRESS KNEE MED (MISCELLANEOUS) IMPLANT
SOLUTION PARTIC MCRMTRX 1000MG (Tissue) ×3 IMPLANT
SPLINT PLASTER CAST XFAST 3X15 (CAST SUPPLIES) IMPLANT
SPLINT PLASTER XTRA FASTSET 3X (CAST SUPPLIES)
SPONGE GAUZE 4X4 12PLY (GAUZE/BANDAGES/DRESSINGS) ×2 IMPLANT
SPONGE LAP 18X18 X RAY DECT (DISPOSABLE) IMPLANT
SPONGE LAP 4X18 X RAY DECT (DISPOSABLE) IMPLANT
STAPLER VISISTAT 35W (STAPLE) IMPLANT
STOCKINETTE 4X48 STRL (DRAPES) IMPLANT
STOCKINETTE 6  STRL (DRAPES) ×1
STOCKINETTE 6 STRL (DRAPES) ×1 IMPLANT
STOCKINETTE IMPERVIOUS LG (DRAPES) IMPLANT
STRIP CLOSURE SKIN 1/2X4 (GAUZE/BANDAGES/DRESSINGS) IMPLANT
SUCTION FRAZIER TIP 10 FR DISP (SUCTIONS) IMPLANT
SURGILUBE 2OZ TUBE FLIPTOP (MISCELLANEOUS) IMPLANT
SUT ETHILON 3 0 PS 1 (SUTURE) IMPLANT
SUT ETHILON 4 0 P 3 18 (SUTURE) IMPLANT
SUT ETHILON 5 0 PS 2 18 (SUTURE) IMPLANT
SUT PROLENE 3 0 PS 2 (SUTURE) IMPLANT
SUT SILK 3 0 PS 1 (SUTURE) IMPLANT
SUT VIC AB 3-0 FS2 27 (SUTURE) IMPLANT
SUT VIC AB 5-0 P-3 18X BRD (SUTURE) IMPLANT
SUT VIC AB 5-0 P3 18 (SUTURE)
SUT VIC AB 5-0 PS2 18 (SUTURE) IMPLANT
SYR BULB IRRIGATION 50ML (SYRINGE) IMPLANT
SYR CONTROL 10ML LL (SYRINGE) ×2 IMPLANT
TAPE HYPAFIX 6X30 (GAUZE/BANDAGES/DRESSINGS) IMPLANT
TIP FLEX 45CM EVICEL (HEMOSTASIS) IMPLANT
TIP RIGID 35CM EVICEL (HEMOSTASIS) IMPLANT
TOWEL OR 17X24 6PK STRL BLUE (TOWEL DISPOSABLE) ×2 IMPLANT
TRAY DSU PREP LF (CUSTOM PROCEDURE TRAY) IMPLANT
TUBE CONNECTING 12X1/4 (SUCTIONS) IMPLANT
UNDERPAD 30X30 INCONTINENT (UNDERPADS AND DIAPERS) ×2 IMPLANT
WATER STERILE IRR 1000ML POUR (IV SOLUTION) ×2 IMPLANT
YANKAUER SUCT BULB TIP NO VENT (SUCTIONS) IMPLANT

## 2012-05-08 NOTE — Brief Op Note (Signed)
05/08/2012  1:03 PM  PATIENT:  Julia West  57 y.o. female  PRE-OPERATIVE DIAGNOSIS:  ABDOMINAL WOUND   POST-OPERATIVE DIAGNOSIS:  ABDOMINAL WOUND   PROCEDURE:  Procedure(s) with comments: IRRIGATION AND DEBRIDEMENT OF ABD WOUND SURGICAL PREP AND PLACEMENT OF VAC  (N/A) - IRRIGATION AND DEBRIDEMENT OF ABD WOUND SURGICAL PREP AND PLACEMENT OF VAC   SURGEON:  Surgeon(s) and Role:    * Jerelene Salaam Sanger, DO - Primary  PHYSICIAN ASSISTANT: Shawn Rayburn, PA  ASSISTANTS: none   ANESTHESIA:   general  EBL:  Total I/O In: 200 [I.V.:200] Out: -   BLOOD ADMINISTERED:none  DRAINS: none   LOCAL MEDICATIONS USED:  NONE  SPECIMEN:  No Specimen  DISPOSITION OF SPECIMEN:  N/A  COUNTS:  YES  TOURNIQUET:  * No tourniquets in log *  DICTATION: dictated  PLAN OF CARE: Discharge to home after PACU  PATIENT DISPOSITION:  PACU - hemodynamically stable.   Delay start of Pharmacological VTE agent (>24hrs) due to surgical blood loss or risk of bleeding: no

## 2012-05-08 NOTE — H&P (View-Only) (Signed)
Julia West is an 56 y.o. female.   Chief Complaint: Large abdominal ulcer HPI: The patient is a 56 yrs old wf here for treatment of her large abdominal ulcer.  She had a hernia that was treated and underwent a laparotomy.  Post operatively she had loss of a large area of the periumbilical area that is full thickness.  She underwent debridement in the last few weeks.  She has remaining necrotic tissue in the abdominal wall.  The base of he ulcer has granulating tissue.  Past Medical History  Diagnosis Date  . Morbid obesity   . Other and unspecified hyperlipidemia   . Unspecified essential hypertension   . Acute myocardial infarction     with a ruptured plaque in the circumflex in 2003  . Degenerative joint disease   . Internal and external hemorrhoids without complication   . Anxiety   . Coronary artery disease   . Complication of anesthesia     states low O2 sats post-op 11/13  . Skin lesion     forehead    Past Surgical History  Procedure Date  . Cesarean section     x 4 in remote past  . Cholecystectomy   . Coronary artery bypass graft 2003    by Dr. Gerhardt. LIMA to the LAD, free RIMA to the circumflex. Stress perfusion study December 2009 with no high-risk areas of ischemia. She has a well-preserved ejection fraction  . Incisional hernia repair 02/07/2012    Procedure: HERNIA REPAIR INCISIONAL;  Surgeon: Eric M Wilson, MD,FACS;  Location: [facility] OR;  Service: General;;  Open, Primary repair, strangulated Incisional hernia.  . Bowel resection 02/07/2012    Procedure: SMALL BOWEL RESECTION;  Surgeon: Eric M Wilson, MD,FACS;  Location: [facility] OR;  Service: General;;  . Application of wound vac     Family History  Problem Relation Age of Onset  . Heart attack Mother   . Hypertension Sister    Social History:  reports that she quit smoking about 4 months ago. Her smoking use included Cigarettes. She has a 25 pack-year smoking history. She does not have any smokeless tobacco  history on file. She reports that she does not drink alcohol or use illicit drugs.  Allergies:  Allergies  Allergen Reactions  . Ace Inhibitors Cough     (Not in a hospital admission)  No results found for this or any previous visit (from the past 48 hour(s)). No results found.  Review of Systems  Constitutional: Negative.   HENT: Negative.   Eyes: Negative.   Respiratory: Negative.   Cardiovascular: Negative.   Gastrointestinal: Negative.   Genitourinary: Negative.   Musculoskeletal: Negative.   Skin: Negative.   Neurological: Negative.   Psychiatric/Behavioral: Negative.     There were no vitals taken for this visit. Physical Exam  Constitutional: She appears well-developed and well-nourished.  HENT:  Head: Normocephalic and atraumatic.  Eyes: Conjunctivae normal and EOM are normal. Pupils are equal, round, and reactive to light.  Neck: Normal range of motion.  Cardiovascular: Normal rate.   Respiratory: Effort normal. No respiratory distress. She has no wheezes.  GI: Soft. There is tenderness.    Musculoskeletal: Normal range of motion.  Neurological: She is alert.  Skin: Skin is warm.  Psychiatric: She has a normal mood and affect. Her behavior is normal. Judgment and thought content normal.     Assessment/Plan Large abdominal ulcer - plan for excision and debridement of abdominal ulcer with placement of Acell and the   VAC. Risks and complications were discussed.  West,Julia Selsor 04/12/2012, 4:19 PM    

## 2012-05-08 NOTE — Anesthesia Procedure Notes (Signed)
Procedure Name: Intubation Date/Time: 05/08/2012 12:25 PM Performed by: Maris Berger T Pre-anesthesia Checklist: Patient identified, Emergency Drugs available, Suction available and Patient being monitored Patient Re-evaluated:Patient Re-evaluated prior to inductionOxygen Delivery Method: Circle System Utilized Preoxygenation: Pre-oxygenation with 100% oxygen Intubation Type: IV induction Ventilation: Mask ventilation without difficulty Laryngoscope Size: Mac and 3 Grade View: Grade I Tube type: Oral Tube size: 7.0 mm Number of attempts: 1 Airway Equipment and Method: stylet and oral airway Placement Confirmation: ETT inserted through vocal cords under direct vision,  positive ETCO2 and breath sounds checked- equal and bilateral Secured at: 21 cm Tube secured with: Tape Dental Injury: Teeth and Oropharynx as per pre-operative assessment

## 2012-05-08 NOTE — Anesthesia Postprocedure Evaluation (Signed)
  Anesthesia Post-op Note  Patient: Julia West  Procedure(s) Performed: Procedure(s) (LRB): IRRIGATION AND DEBRIDEMENT OF ABD WOUND SURGICAL PREP AND PLACEMENT OF VAC  (N/A)  Patient Location: PACU  Anesthesia Type: General  Level of Consciousness: awake and alert   Airway and Oxygen Therapy: Patient Spontanous Breathing  Post-op Pain: mild  Post-op Assessment: Post-op Vital signs reviewed, Patient's Cardiovascular Status Stable, Respiratory Function Stable, Patent Airway and No signs of Nausea or vomiting  Last Vitals:  Filed Vitals:   05/08/12 1313  BP: 109/43  Pulse:   Temp: 37.1 C  Resp: 16    Post-op Vital Signs: stable   Complications: No apparent anesthesia complications

## 2012-05-08 NOTE — Anesthesia Preprocedure Evaluation (Signed)
Anesthesia Evaluation  Patient identified by MRN, date of birth, ID band Patient awake    Reviewed: Allergy & Precautions, H&P , NPO status , Patient's Chart, lab work & pertinent test results, reviewed documented beta blocker date and time   History of Anesthesia Complications Negative for: history of anesthetic complications  Airway Mallampati: II TM Distance: >3 FB Neck ROM: full    Dental no notable dental hx. (+) Missing, Edentulous Upper and Dental Advisory Given,    Pulmonary neg pulmonary ROS, former smoker,  Low )2 sats post op 11/13 breath sounds clear to auscultation  Pulmonary exam normal       Cardiovascular hypertension, Pt. on home beta blockers and Pt. on medications + CAD, + Past MI and + CABG Rhythm:regular Rate:Normal  AMI circumflex 2003   Neuro/Psych Anxiety negative neurological ROS  negative psych ROS   GI/Hepatic negative GI ROS, Neg liver ROS,   Endo/Other  Morbid obesity  Renal/GU negative Renal ROS  negative genitourinary   Musculoskeletal negative musculoskeletal ROS (+)   Abdominal (+) + obese,   Peds negative pediatric ROS (+)  Hematology negative hematology ROS (+)   Anesthesia Other Findings   Reproductive/Obstetrics negative OB ROS                           Anesthesia Physical  Anesthesia Plan  ASA: III  Anesthesia Plan: General   Post-op Pain Management:    Induction: Intravenous  Airway Management Planned: Oral ETT  Additional Equipment:   Intra-op Plan:   Post-operative Plan: Extubation in OR  Informed Consent: I have reviewed the patients History and Physical, chart, labs and discussed the procedure including the risks, benefits and alternatives for the proposed anesthesia with the patient or authorized representative who has indicated his/her understanding and acceptance.   Dental advisory given  Plan Discussed with: CRNA  Anesthesia  Plan Comments:         Anesthesia Quick Evaluation

## 2012-05-08 NOTE — Transfer of Care (Signed)
Immediate Anesthesia Transfer of Care Note  Patient: Julia West  Procedure(s) Performed: Procedure(s) with comments: IRRIGATION AND DEBRIDEMENT OF ABD WOUND SURGICAL PREP AND PLACEMENT OF VAC  (N/A) - IRRIGATION AND DEBRIDEMENT OF ABD WOUND SURGICAL PREP AND PLACEMENT OF VAC   Patient Location: PACU  Anesthesia Type:General  Level of Consciousness: awake and oriented  Airway & Oxygen Therapy: Patient Spontanous Breathing and Patient connected to nasal cannula oxygen  Post-op Assessment: Report given to PACU RN  Post vital signs: Reviewed and stable  Complications: No apparent anesthesia complications

## 2012-05-08 NOTE — Interval H&P Note (Signed)
History and Physical Interval Note:  05/08/2012 12:01 PM  Julia West  has presented today for surgery, with the diagnosis of ABDOMINAL WOUND   The various methods of treatment have been discussed with the patient and family. After consideration of risks, benefits and other options for treatment, the patient has consented to  Procedure(s) with comments: IRRIGATION AND DEBRIDEMENT OF ABD WOUND SURGICAL PREP AND PLACEMENT OF VAC  (N/A) - IRRIGATION AND DEBRIDEMENT OF ABD WOUND SURGICAL PREP AND PLACEMENT OF VAC  as a surgical intervention .  The patient's history has been reviewed, patient examined, no change in status, stable for surgery.  I have reviewed the patient's chart and labs.  Questions were answered to the patient's satisfaction.     SANGER,Dharma Pare

## 2012-05-09 ENCOUNTER — Encounter (HOSPITAL_BASED_OUTPATIENT_CLINIC_OR_DEPARTMENT_OTHER): Payer: Self-pay | Admitting: Plastic Surgery

## 2012-05-10 ENCOUNTER — Encounter (HOSPITAL_BASED_OUTPATIENT_CLINIC_OR_DEPARTMENT_OTHER): Payer: Self-pay | Admitting: *Deleted

## 2012-05-10 NOTE — Progress Notes (Signed)
NPO AFTER MN. ARRIVES AT 1015. NEEDS ISTAT. CURRENT EKG IN EPIC AND CHART. WILL TAKE METOPROLOL AM OF SURG W/ SIP OF WATER.

## 2012-05-15 ENCOUNTER — Ambulatory Visit (HOSPITAL_BASED_OUTPATIENT_CLINIC_OR_DEPARTMENT_OTHER): Payer: Medicare Other | Admitting: Anesthesiology

## 2012-05-15 ENCOUNTER — Encounter (HOSPITAL_BASED_OUTPATIENT_CLINIC_OR_DEPARTMENT_OTHER)
Admission: RE | Disposition: A | Discharge status: Home / Self Care | Payer: Self-pay | Source: Ambulatory Visit | Attending: Plastic Surgery

## 2012-05-15 ENCOUNTER — Ambulatory Visit (HOSPITAL_BASED_OUTPATIENT_CLINIC_OR_DEPARTMENT_OTHER)
Admission: RE | Admit: 2012-05-15 | Discharge: 2012-05-15 | Disposition: A | Discharge status: Home / Self Care | Payer: Medicare Other | Source: Ambulatory Visit | Attending: Plastic Surgery | Admitting: Plastic Surgery

## 2012-05-15 ENCOUNTER — Other Ambulatory Visit: Payer: Self-pay | Admitting: Plastic Surgery

## 2012-05-15 ENCOUNTER — Encounter (HOSPITAL_BASED_OUTPATIENT_CLINIC_OR_DEPARTMENT_OTHER): Payer: Self-pay | Admitting: Plastic Surgery

## 2012-05-15 ENCOUNTER — Encounter (HOSPITAL_BASED_OUTPATIENT_CLINIC_OR_DEPARTMENT_OTHER): Payer: Self-pay | Admitting: Anesthesiology

## 2012-05-15 DIAGNOSIS — E785 Hyperlipidemia, unspecified: Secondary | ICD-10-CM | POA: Insufficient documentation

## 2012-05-15 DIAGNOSIS — I2510 Atherosclerotic heart disease of native coronary artery without angina pectoris: Secondary | ICD-10-CM | POA: Insufficient documentation

## 2012-05-15 DIAGNOSIS — Z951 Presence of aortocoronary bypass graft: Secondary | ICD-10-CM | POA: Insufficient documentation

## 2012-05-15 DIAGNOSIS — Z7982 Long term (current) use of aspirin: Secondary | ICD-10-CM | POA: Insufficient documentation

## 2012-05-15 DIAGNOSIS — L98499 Non-pressure chronic ulcer of skin of other sites with unspecified severity: Secondary | ICD-10-CM | POA: Insufficient documentation

## 2012-05-15 DIAGNOSIS — L98492 Non-pressure chronic ulcer of skin of other sites with fat layer exposed: Secondary | ICD-10-CM

## 2012-05-15 DIAGNOSIS — I252 Old myocardial infarction: Secondary | ICD-10-CM | POA: Insufficient documentation

## 2012-05-15 DIAGNOSIS — Z79899 Other long term (current) drug therapy: Secondary | ICD-10-CM | POA: Insufficient documentation

## 2012-05-15 DIAGNOSIS — I10 Essential (primary) hypertension: Secondary | ICD-10-CM | POA: Insufficient documentation

## 2012-05-15 HISTORY — PX: INCISION AND DRAINAGE OF WOUND: SHX1803

## 2012-05-15 LAB — POCT I-STAT, CHEM 8
Calcium, Ion: 1.18 mmol/L (ref 1.12–1.23)
Creatinine, Ser: 0.9 mg/dL (ref 0.50–1.10)
Glucose, Bld: 111 mg/dL — ABNORMAL HIGH (ref 70–99)
HCT: 33 % — ABNORMAL LOW (ref 36.0–46.0)
Hemoglobin: 11.2 g/dL — ABNORMAL LOW (ref 12.0–15.0)
Potassium: 4.4 mEq/L (ref 3.5–5.1)

## 2012-05-15 SURGERY — IRRIGATION AND DEBRIDEMENT WOUND
Anesthesia: General | Site: Abdomen | Wound class: Clean Contaminated

## 2012-05-15 MED ORDER — SUCCINYLCHOLINE CHLORIDE 20 MG/ML IJ SOLN
INTRAMUSCULAR | Status: DC | PRN
  Administered 2012-05-15: 120 mg via INTRAVENOUS

## 2012-05-15 MED ORDER — FENTANYL CITRATE 0.05 MG/ML IJ SOLN
INTRAMUSCULAR | Status: DC | PRN
  Administered 2012-05-15 (×4): 50 ug via INTRAVENOUS

## 2012-05-15 MED ORDER — MIDAZOLAM HCL 5 MG/5ML IJ SOLN
INTRAMUSCULAR | Status: DC | PRN
  Administered 2012-05-15: 2 mg via INTRAVENOUS

## 2012-05-15 MED ORDER — KETOROLAC TROMETHAMINE 30 MG/ML IJ SOLN
INTRAMUSCULAR | Status: DC | PRN
  Administered 2012-05-15: 30 mg via INTRAVENOUS

## 2012-05-15 MED ORDER — PROPOFOL 10 MG/ML IV BOLUS
INTRAVENOUS | Status: DC | PRN
  Administered 2012-05-15: 250 mg via INTRAVENOUS

## 2012-05-15 MED ORDER — ONDANSETRON HCL 4 MG/2ML IJ SOLN
INTRAMUSCULAR | Status: DC | PRN
  Administered 2012-05-15: 4 mg via INTRAVENOUS

## 2012-05-15 MED ORDER — KETOROLAC TROMETHAMINE 30 MG/ML IJ SOLN
15.0000 mg | Freq: Once | INTRAMUSCULAR | Status: DC | PRN
  Filled 2012-05-15: qty 1

## 2012-05-15 MED ORDER — DEXAMETHASONE SODIUM PHOSPHATE 4 MG/ML IJ SOLN
INTRAMUSCULAR | Status: DC | PRN
  Administered 2012-05-15: 4 mg via INTRAVENOUS

## 2012-05-15 MED ORDER — CEFAZOLIN SODIUM-DEXTROSE 2-3 GM-% IV SOLR
2.0000 g | INTRAVENOUS | Status: AC
  Administered 2012-05-15: 3 g via INTRAVENOUS
  Filled 2012-05-15: qty 50

## 2012-05-15 MED ORDER — PROMETHAZINE HCL 25 MG/ML IJ SOLN
6.2500 mg | INTRAMUSCULAR | Status: DC | PRN
  Filled 2012-05-15: qty 1

## 2012-05-15 MED ORDER — EPHEDRINE SULFATE 50 MG/ML IJ SOLN
INTRAMUSCULAR | Status: DC | PRN
  Administered 2012-05-15: 15 mg via INTRAVENOUS
  Administered 2012-05-15 (×2): 10 mg via INTRAVENOUS

## 2012-05-15 MED ORDER — FENTANYL CITRATE 0.05 MG/ML IJ SOLN
25.0000 ug | INTRAMUSCULAR | Status: DC | PRN
  Filled 2012-05-15: qty 1

## 2012-05-15 MED ORDER — LACTATED RINGERS IV SOLN
INTRAVENOUS | Status: DC
  Administered 2012-05-15: 11:00:00 via INTRAVENOUS
  Filled 2012-05-15: qty 1000

## 2012-05-15 MED ORDER — LIDOCAINE HCL (CARDIAC) 20 MG/ML IV SOLN
INTRAVENOUS | Status: DC | PRN
  Administered 2012-05-15: 80 mg via INTRAVENOUS

## 2012-05-15 MED ORDER — SODIUM CHLORIDE 0.9 % IR SOLN
Status: DC | PRN
  Administered 2012-05-15: 13:00:00

## 2012-05-15 MED ORDER — ACETAMINOPHEN 10 MG/ML IV SOLN
INTRAVENOUS | Status: DC | PRN
  Administered 2012-05-15: 1000 mg via INTRAVENOUS

## 2012-05-15 MED ORDER — HYDROCODONE-ACETAMINOPHEN 5-325 MG PO TABS
1.0000 | ORAL_TABLET | Freq: Four times a day (QID) | ORAL | Status: AC | PRN
  Administered 2012-05-15: 1 via ORAL
  Filled 2012-05-15: qty 1

## 2012-05-15 SURGICAL SUPPLY — 90 items
BAG DECANTER FOR FLEXI CONT (MISCELLANEOUS) IMPLANT
BANDAGE ELASTIC 3 VELCRO ST LF (GAUZE/BANDAGES/DRESSINGS) IMPLANT
BANDAGE ELASTIC 4 VELCRO ST LF (GAUZE/BANDAGES/DRESSINGS) IMPLANT
BANDAGE ELASTIC 6 VELCRO ST LF (GAUZE/BANDAGES/DRESSINGS) IMPLANT
BANDAGE GAUZE ELAST BULKY 4 IN (GAUZE/BANDAGES/DRESSINGS) IMPLANT
BENZOIN TINCTURE PRP APPL 2/3 (GAUZE/BANDAGES/DRESSINGS) IMPLANT
BLADE MINI RND TIP GREEN BEAV (BLADE) IMPLANT
BLADE SURG 10 STRL SS (BLADE) ×2 IMPLANT
BLADE SURG 15 STRL LF DISP TIS (BLADE) ×1 IMPLANT
BLADE SURG 15 STRL SS (BLADE) ×1
BNDG COHESIVE 1X5 TAN STRL LF (GAUZE/BANDAGES/DRESSINGS) IMPLANT
BNDG COHESIVE 4X5 TAN NS LF (GAUZE/BANDAGES/DRESSINGS) ×2 IMPLANT
BNDG ESMARK 4X9 LF (GAUZE/BANDAGES/DRESSINGS) IMPLANT
CANISTER OMNI JUG 16 LITER (MISCELLANEOUS) IMPLANT
CANISTER SUCTION 1200CC (MISCELLANEOUS) IMPLANT
CANISTER SUCTION 2500CC (MISCELLANEOUS) ×2 IMPLANT
CHLORAPREP W/TINT 26ML (MISCELLANEOUS) IMPLANT
CLOTH BEACON ORANGE TIMEOUT ST (SAFETY) ×2 IMPLANT
CORDS BIPOLAR (ELECTRODE) IMPLANT
COVER MAYO STAND STRL (DRAPES) ×2 IMPLANT
COVER TABLE BACK 60X90 (DRAPES) ×2 IMPLANT
DECANTER SPIKE VIAL GLASS SM (MISCELLANEOUS) IMPLANT
DRAIN PENROSE 18X1/2 LTX STRL (DRAIN) ×2 IMPLANT
DRAPE EXTREMITY T 121X128X90 (DRAPE) IMPLANT
DRAPE INCISE IOBAN 66X45 STRL (DRAPES) ×2 IMPLANT
DRAPE LG THREE QUARTER DISP (DRAPES) IMPLANT
DRSG ADAPTIC 3X8 NADH LF (GAUZE/BANDAGES/DRESSINGS) ×2 IMPLANT
DRSG EMULSION OIL 3X3 NADH (GAUZE/BANDAGES/DRESSINGS) IMPLANT
DRSG PAD ABDOMINAL 8X10 ST (GAUZE/BANDAGES/DRESSINGS) IMPLANT
ELECT NEEDLE TIP 2.8 STRL (NEEDLE) IMPLANT
ELECT REM PT RETURN 9FT ADLT (ELECTROSURGICAL) ×2
ELECTRODE REM PT RTRN 9FT ADLT (ELECTROSURGICAL) ×1 IMPLANT
GAUZE SPONGE 4X4 12PLY STRL LF (GAUZE/BANDAGES/DRESSINGS) IMPLANT
GAUZE XEROFORM 1X8 LF (GAUZE/BANDAGES/DRESSINGS) IMPLANT
GAUZE XEROFORM 5X9 LF (GAUZE/BANDAGES/DRESSINGS) IMPLANT
GLOVE BIO SURGEON STRL SZ 6.5 (GLOVE) ×4 IMPLANT
GLOVE ECLIPSE 7.0 STRL STRAW (GLOVE) ×2 IMPLANT
GLOVE INDICATOR 7.5 STRL GRN (GLOVE) ×2 IMPLANT
GOWN PREVENTION PLUS XLARGE (GOWN DISPOSABLE) ×2 IMPLANT
HANDPIECE INTERPULSE COAX TIP (DISPOSABLE)
IV NS IRRIG 3000ML ARTHROMATIC (IV SOLUTION) IMPLANT
MICROMATRIX 1000MG (Tissue) ×6 IMPLANT
MICROMATRIX 500MG (Tissue) ×2 IMPLANT
NEEDLE 27GAX1X1/2 (NEEDLE) IMPLANT
NEEDLE HYPO 30GX1 BEV (NEEDLE) IMPLANT
NS IRRIG 1000ML POUR BTL (IV SOLUTION) IMPLANT
PACK BASIN DAY SURGERY FS (CUSTOM PROCEDURE TRAY) ×2 IMPLANT
PADDING CAST ABS 3INX4YD NS (CAST SUPPLIES)
PADDING CAST ABS 4INX4YD NS (CAST SUPPLIES)
PADDING CAST ABS COTTON 3X4 (CAST SUPPLIES) IMPLANT
PADDING CAST ABS COTTON 4X4 ST (CAST SUPPLIES) IMPLANT
PENCIL BUTTON HOLSTER BLD 10FT (ELECTRODE) IMPLANT
SET HNDPC FAN SPRY TIP SCT (DISPOSABLE) IMPLANT
SLEEVE SCD COMPRESS KNEE MED (MISCELLANEOUS) IMPLANT
SOLUTION PARTIC MCRMTRX 1000MG (Tissue) ×3 IMPLANT
SOLUTION PARTIC MCRMTRX 500MG (Tissue) ×1 IMPLANT
SPLINT PLASTER CAST XFAST 3X15 (CAST SUPPLIES) IMPLANT
SPLINT PLASTER XTRA FASTSET 3X (CAST SUPPLIES)
SPONGE GAUZE 4X4 12PLY (GAUZE/BANDAGES/DRESSINGS) IMPLANT
SPONGE LAP 18X18 X RAY DECT (DISPOSABLE) IMPLANT
SPONGE LAP 4X18 X RAY DECT (DISPOSABLE) IMPLANT
STAPLER VISISTAT 35W (STAPLE) IMPLANT
STOCKINETTE 4X48 STRL (DRAPES) IMPLANT
STOCKINETTE 6  STRL (DRAPES)
STOCKINETTE 6 STRL (DRAPES) IMPLANT
STOCKINETTE IMPERVIOUS LG (DRAPES) IMPLANT
STRIP CLOSURE SKIN 1/2X4 (GAUZE/BANDAGES/DRESSINGS) IMPLANT
SUCTION FRAZIER TIP 10 FR DISP (SUCTIONS) IMPLANT
SURGILUBE 2OZ TUBE FLIPTOP (MISCELLANEOUS) IMPLANT
SUT ETHILON 2 0 PS N (SUTURE) ×4 IMPLANT
SUT ETHILON 3 0 PS 1 (SUTURE) IMPLANT
SUT ETHILON 4 0 P 3 18 (SUTURE) IMPLANT
SUT ETHILON 5 0 PS 2 18 (SUTURE) IMPLANT
SUT PROLENE 3 0 PS 2 (SUTURE) IMPLANT
SUT SILK 3 0 PS 1 (SUTURE) IMPLANT
SUT VIC AB 3-0 FS2 27 (SUTURE) IMPLANT
SUT VIC AB 5-0 P-3 18X BRD (SUTURE) IMPLANT
SUT VIC AB 5-0 P3 18 (SUTURE)
SUT VIC AB 5-0 PS2 18 (SUTURE) IMPLANT
SYR BULB IRRIGATION 50ML (SYRINGE) IMPLANT
SYR CONTROL 10ML LL (SYRINGE) ×2 IMPLANT
TAPE HYPAFIX 6X30 (GAUZE/BANDAGES/DRESSINGS) IMPLANT
TIP FLEX 45CM EVICEL (HEMOSTASIS) IMPLANT
TIP RIGID 35CM EVICEL (HEMOSTASIS) IMPLANT
TOWEL OR 17X24 6PK STRL BLUE (TOWEL DISPOSABLE) ×2 IMPLANT
TRAY DSU PREP LF (CUSTOM PROCEDURE TRAY) IMPLANT
TUBE CONNECTING 12X1/4 (SUCTIONS) IMPLANT
UNDERPAD 30X30 INCONTINENT (UNDERPADS AND DIAPERS) ×2 IMPLANT
WATER STERILE IRR 1000ML POUR (IV SOLUTION) IMPLANT
YANKAUER SUCT BULB TIP NO VENT (SUCTIONS) IMPLANT

## 2012-05-15 NOTE — Transfer of Care (Signed)
Immediate Anesthesia Transfer of Care Note  Patient: Julia West  Procedure(s) Performed: Procedure(s) (LRB): IRRIGATION AND DEBRIDEMENT OF ABDOMINAL WOUND WITH POSSIBLE SURGICAL PREP AND PLACEMENT OF VAC (N/A)  Patient Location: PACU  Anesthesia Type: General  Level of Consciousness: drowsy  Airway & Oxygen Therapy: Patient Spontanous Breathing and Patient connected to face mask oxygen  Post-op Assessment: Report given to PACU RN and Post -op Vital signs reviewed and stable  Post vital signs: Reviewed and stable  Complications: No apparent anesthesia complications

## 2012-05-15 NOTE — H&P (View-Only) (Signed)
Julia West is an 57 y.o. female.   Chief Complaint: abdominal ulcer HPI: The patient is a 57 yrs old wf being treated for a massive abdominal ulcer.  She has undergone multiple debridements over the past several weeks.  Acell and the VAC were placed with improvement noted at the last debridement.  She is tolerating the VAC well at home and is starting show granulation formation at the base of the ulcer.  Past Medical History  Diagnosis Date  . Morbid obesity   . Other and unspecified hyperlipidemia   . Unspecified essential hypertension   . Acute myocardial infarction     with a ruptured plaque in the circumflex in 2003  . Degenerative joint disease   . Internal and external hemorrhoids without complication   . Anxiety   . Coronary artery disease   . Complication of anesthesia     states low O2 sats post-op 11/13  . Surgical wound, non healing ABDOMINAL     has wound vac @ 125 mm Hg    Past Surgical History  Procedure Laterality Date  . Cesarean section      x 4 in remote past  . Cholecystectomy    . Coronary artery bypass graft  2003    by Dr. Gerhardt. LIMA to the LAD, free RIMA to the circumflex. Stress perfusion study December 2009 with no high-risk areas of ischemia. She has a well-preserved ejection fraction  . Incisional hernia repair  02/07/2012    Procedure: HERNIA REPAIR INCISIONAL;  Surgeon: Eric M Wilson, MD,FACS;  Location: [facility];  Service: General;;  Open, Primary repair, strangulated Incisional hernia.  . Bowel resection  02/07/2012    Procedure: SMALL BOWEL RESECTION;  Surgeon: Eric M Wilson, MD,FACS;  Location: [facility];  Service: General;;  . Application of wound vac    . Incision and drainage of wound  04/17/2012    Procedure: IRRIGATION AND DEBRIDEMENT WOUND;  Surgeon: Claire Sanger, DO;  Location: [facility];  Service: Plastics;  Laterality: N/A;  OF ABDOMINAL WOUND, SURGICAL PREP AND PLACEMENT OF VAC, REMOVAL FOREHEAD SKIN LESION  . Lesion  removal  04/17/2012    Procedure: LESION REMOVAL;  Surgeon: Claire Sanger, DO;  Location: [facility];  Service: Plastics;  Laterality: N/A;   CENTER OF FOREHEAD, REMOVAL FORHEAD SKIN LESION  . Incision and drainage of wound N/A 04/24/2012    Procedure: IRRIGATION AND DEBRIDEMENT WOUND;  Surgeon: Claire Sanger, DO;  Location: [facility];  Service: Plastics;  Laterality: N/A;  I & D ABDOMINAL WOUND WITH VAC AND ACELL  . Incision and drainage of wound N/A 05/01/2012    Procedure: IRRIGATION AND DEBRIDEMENT WOUND;  Surgeon: Claire Sanger, DO;  Location: [facility];  Service: Plastics;  Laterality: N/A;  WITH SURGICAL PREP AND PLACEMENT OF VAC  . Incision and drainage of wound N/A 05/08/2012    Procedure: IRRIGATION AND DEBRIDEMENT OF ABD WOUND SURGICAL PREP AND PLACEMENT OF VAC ;  Surgeon: Claire Sanger, DO;  Location: [facility];  Service: Plastics;  Laterality: N/A;  IRRIGATION AND DEBRIDEMENT OF ABD WOUND SURGICAL PREP AND PLACEMENT OF VAC   . Cardiovascular stress test  01-17-2012  DR HOCHREIN    LOW RISK NUCLEAR TEST    Family History  Problem Relation Age of Onset  . Heart attack Mother   . Hypertension Sister    Social History:  reports that she quit smoking about 5 months ago. Her smoking use   included Cigarettes. She has a 25 pack-year smoking history. She has never used smokeless tobacco. She reports that she does not drink alcohol or use illicit drugs.  Allergies:  Allergies  Allergen Reactions  . Ace Inhibitors Cough    Medications Prior to Admission  Medication Sig Dispense Refill  . aspirin 81 MG tablet Take 81 mg by mouth daily.       . beta carotene w/minerals (OCUVITE) tablet Take 1 tablet by mouth daily.      . citalopram (CELEXA) 20 MG tablet Take 20 mg by mouth daily.      . diclofenac (VOLTAREN) 75 MG EC tablet Take 75 mg by mouth 2 (two) times daily.        . doxycycline (DORYX) 100 MG EC tablet Take 100 mg by  mouth 2 (two) times daily.      . Fe Fum-FePoly-Vit C-Vit B3 (INTEGRA PO) Take by mouth.      . fish oil-omega-3 fatty acids 1000 MG capsule Take 2 g by mouth daily.        . furosemide (LASIX) 20 MG tablet Take 10 mg by mouth daily.        . HYDROcodone-acetaminophen (NORCO/VICODIN) 5-325 MG per tablet Take 1 tablet by mouth every 6 (six) hours as needed for pain.  40 tablet  0  . metoprolol (LOPRESSOR) 50 MG tablet Take 50 mg by mouth 2 (two) times daily.        . rosuvastatin (CRESTOR) 10 MG tablet Take 10 mg by mouth daily.        . valsartan-hydrochlorothiazide (DIOVAN-HCT) 160-12.5 MG per tablet Take 1 tablet by mouth daily.      . vitamin C (ASCORBIC ACID) 500 MG tablet Take 500 mg by mouth daily.      . zinc gluconate 50 MG tablet Take 50 mg by mouth daily.      . zolpidem (AMBIEN) 10 MG tablet Take 10 mg by mouth at bedtime as needed. For sleep      . ALPRAZolam (XANAX) 0.25 MG tablet Take 0.25 mg by mouth at bedtime as needed for sleep.        No results found for this or any previous visit (from the past 48 hour(s)). No results found.  Review of Systems  Constitutional: Negative.   HENT: Negative.   Eyes: Negative.   Respiratory: Negative.   Cardiovascular: Negative.   Gastrointestinal: Negative.   Genitourinary: Negative.   Musculoskeletal: Negative.   Skin: Negative.   Psychiatric/Behavioral: Negative.     Blood pressure 134/69, pulse 56, temperature 97.3 F (36.3 C), temperature source Oral, resp. rate 18, weight 126.554 kg (279 lb), SpO2 98.00%. Physical Exam  Constitutional: She appears well-developed.  HENT:  Head: Normocephalic and atraumatic.  Eyes: Pupils are equal, round, and reactive to light.  Cardiovascular: Normal rate.   Respiratory: Effort normal.  GI: Soft. There is tenderness.    Skin: Skin is warm. There is erythema.  Psychiatric: She has a normal mood and affect. Her behavior is normal. Thought content normal.     Assessment/Plan Chronic  Abdominal ulcer - Irrigation and debridement of the ulcer with placement of Acell and the VAC.  SANGER,CLAIRE 05/15/2012, 10:44 AM    

## 2012-05-15 NOTE — H&P (Signed)
Julia West is an 57 y.o. female.   Chief Complaint: abdominal ulcer HPI: The patient is a 57 yrs old wf being treated for a massive abdominal ulcer.  She has undergone multiple debridements over the past several weeks.  Acell and the VAC were placed with improvement noted at the last debridement.  She is tolerating the VAC well at home and is starting show granulation formation at the base of the ulcer.  Past Medical History  Diagnosis Date  . Morbid obesity   . Other and unspecified hyperlipidemia   . Unspecified essential hypertension   . Acute myocardial infarction     with a ruptured plaque in the circumflex in 2003  . Degenerative joint disease   . Internal and external hemorrhoids without complication   . Anxiety   . Coronary artery disease   . Complication of anesthesia     states low O2 sats post-op 11/13  . Surgical wound, non healing ABDOMINAL     has wound vac @ 125 mm Hg    Past Surgical History  Procedure Laterality Date  . Cesarean section      x 4 in remote past  . Cholecystectomy    . Coronary artery bypass graft  2003    by Dr. Gerhardt. LIMA to the LAD, free RIMA to the circumflex. Stress perfusion study December 2009 with no high-risk areas of ischemia. She has a well-preserved ejection fraction  . Incisional hernia repair  02/07/2012    Procedure: HERNIA REPAIR INCISIONAL;  Surgeon: Eric M Wilson, MD,FACS;  Location: [facility];  Service: General;;  Open, Primary repair, strangulated Incisional hernia.  . Bowel resection  02/07/2012    Procedure: SMALL BOWEL RESECTION;  Surgeon: Eric M Wilson, MD,FACS;  Location: [facility];  Service: General;;  . Application of wound vac    . Incision and drainage of wound  04/17/2012    Procedure: IRRIGATION AND DEBRIDEMENT WOUND;  Surgeon: Claire Sanger, DO;  Location: [facility];  Service: Plastics;  Laterality: N/A;  OF ABDOMINAL WOUND, SURGICAL PREP AND PLACEMENT OF VAC, REMOVAL FOREHEAD SKIN LESION  . Lesion  removal  04/17/2012    Procedure: LESION REMOVAL;  Surgeon: Claire Sanger, DO;  Location: [facility];  Service: Plastics;  Laterality: N/A;   CENTER OF FOREHEAD, REMOVAL FORHEAD SKIN LESION  . Incision and drainage of wound N/A 04/24/2012    Procedure: IRRIGATION AND DEBRIDEMENT WOUND;  Surgeon: Claire Sanger, DO;  Location: [facility];  Service: Plastics;  Laterality: N/A;  I & D ABDOMINAL WOUND WITH VAC AND ACELL  . Incision and drainage of wound N/A 05/01/2012    Procedure: IRRIGATION AND DEBRIDEMENT WOUND;  Surgeon: Claire Sanger, DO;  Location: [facility];  Service: Plastics;  Laterality: N/A;  WITH SURGICAL PREP AND PLACEMENT OF VAC  . Incision and drainage of wound N/A 05/08/2012    Procedure: IRRIGATION AND DEBRIDEMENT OF ABD WOUND SURGICAL PREP AND PLACEMENT OF VAC ;  Surgeon: Claire Sanger, DO;  Location: [facility];  Service: Plastics;  Laterality: N/A;  IRRIGATION AND DEBRIDEMENT OF ABD WOUND SURGICAL PREP AND PLACEMENT OF VAC   . Cardiovascular stress test  01-17-2012  DR HOCHREIN    LOW RISK NUCLEAR TEST    Family History  Problem Relation Age of Onset  . Heart attack Mother   . Hypertension Sister    Social History:  reports that she quit smoking about 5 months ago. Her smoking use   included Cigarettes. She has a 25 pack-year smoking history. She has never used smokeless tobacco. She reports that she does not drink alcohol or use illicit drugs.  Allergies:  Allergies  Allergen Reactions  . Ace Inhibitors Cough    Medications Prior to Admission  Medication Sig Dispense Refill  . aspirin 81 MG tablet Take 81 mg by mouth daily.       . beta carotene w/minerals (OCUVITE) tablet Take 1 tablet by mouth daily.      . citalopram (CELEXA) 20 MG tablet Take 20 mg by mouth daily.      . diclofenac (VOLTAREN) 75 MG EC tablet Take 75 mg by mouth 2 (two) times daily.        . doxycycline (DORYX) 100 MG EC tablet Take 100 mg by  mouth 2 (two) times daily.      . Fe Fum-FePoly-Vit C-Vit B3 (INTEGRA PO) Take by mouth.      . fish oil-omega-3 fatty acids 1000 MG capsule Take 2 g by mouth daily.        . furosemide (LASIX) 20 MG tablet Take 10 mg by mouth daily.        . HYDROcodone-acetaminophen (NORCO/VICODIN) 5-325 MG per tablet Take 1 tablet by mouth every 6 (six) hours as needed for pain.  40 tablet  0  . metoprolol (LOPRESSOR) 50 MG tablet Take 50 mg by mouth 2 (two) times daily.        . rosuvastatin (CRESTOR) 10 MG tablet Take 10 mg by mouth daily.        . valsartan-hydrochlorothiazide (DIOVAN-HCT) 160-12.5 MG per tablet Take 1 tablet by mouth daily.      . vitamin C (ASCORBIC ACID) 500 MG tablet Take 500 mg by mouth daily.      . zinc gluconate 50 MG tablet Take 50 mg by mouth daily.      . zolpidem (AMBIEN) 10 MG tablet Take 10 mg by mouth at bedtime as needed. For sleep      . ALPRAZolam (XANAX) 0.25 MG tablet Take 0.25 mg by mouth at bedtime as needed for sleep.        No results found for this or any previous visit (from the past 48 hour(s)). No results found.  Review of Systems  Constitutional: Negative.   HENT: Negative.   Eyes: Negative.   Respiratory: Negative.   Cardiovascular: Negative.   Gastrointestinal: Negative.   Genitourinary: Negative.   Musculoskeletal: Negative.   Skin: Negative.   Psychiatric/Behavioral: Negative.     Blood pressure 134/69, pulse 56, temperature 97.3 F (36.3 C), temperature source Oral, resp. rate 18, weight 126.554 kg (279 lb), SpO2 98.00%. Physical Exam  Constitutional: She appears well-developed.  HENT:  Head: Normocephalic and atraumatic.  Eyes: Pupils are equal, round, and reactive to light.  Cardiovascular: Normal rate.   Respiratory: Effort normal.  GI: Soft. There is tenderness.    Skin: Skin is warm. There is erythema.  Psychiatric: She has a normal mood and affect. Her behavior is normal. Thought content normal.     Assessment/Plan Chronic  Abdominal ulcer - Irrigation and debridement of the ulcer with placement of Acell and the VAC.  SANGER,CLAIRE 05/15/2012, 10:44 AM    

## 2012-05-15 NOTE — Progress Notes (Signed)
Dr Leta Jungling notified of pt 02 sats dip into 84% approx q 5" but goes back up to 95 %. Pt on RA & using IS. Dr Leta Jungling requests 02 sats maintain > 90% on RA over 10" & not dip into 84% pt may be D/C home.

## 2012-05-15 NOTE — Anesthesia Postprocedure Evaluation (Signed)
  Anesthesia Post-op Note  Patient: Julia West  Procedure(s) Performed: Procedure(s) (LRB): IRRIGATION AND DEBRIDEMENT OF ABDOMINAL WOUND WITH POSSIBLE SURGICAL PREP AND PLACEMENT OF VAC (N/A)  Patient Location: PACU  Anesthesia Type: General  Level of Consciousness: awake and alert   Airway and Oxygen Therapy: Patient Spontanous Breathing  Post-op Pain: mild  Post-op Assessment: Post-op Vital signs reviewed, Patient's Cardiovascular Status Stable, Respiratory Function Stable, Patent Airway and No signs of Nausea or vomiting  Last Vitals:  Filed Vitals:   05/15/12 1315  BP: 118/47  Pulse: 58  Temp:   Resp: 18    Post-op Vital Signs: stable   Complications: No apparent anesthesia complications

## 2012-05-15 NOTE — Anesthesia Preprocedure Evaluation (Signed)
Anesthesia Evaluation  Patient identified by MRN, date of birth, ID band Patient awake    Reviewed: Allergy & Precautions, H&P , NPO status , Patient's Chart, lab work & pertinent test results  Airway Mallampati: II TM Distance: >3 FB Neck ROM: Full    Dental no notable dental hx.    Pulmonary former smoker,  breath sounds clear to auscultation  Pulmonary exam normal       Cardiovascular hypertension, + CAD, + Past MI and + CABG negative cardio ROS  Rhythm:Regular Rate:Normal     Neuro/Psych negative neurological ROS  negative psych ROS   GI/Hepatic negative GI ROS, Neg liver ROS,   Endo/Other  Morbid obesity  Renal/GU negative Renal ROS  negative genitourinary   Musculoskeletal negative musculoskeletal ROS (+)   Abdominal   Peds negative pediatric ROS (+)  Hematology  (+) Blood dyscrasia, anemia ,   Anesthesia Other Findings   Reproductive/Obstetrics negative OB ROS                           Anesthesia Physical Anesthesia Plan  ASA: III  Anesthesia Plan: General   Post-op Pain Management:    Induction: Intravenous  Airway Management Planned: Oral ETT  Additional Equipment:   Intra-op Plan:   Post-operative Plan: Extubation in OR  Informed Consent: I have reviewed the patients History and Physical, chart, labs and discussed the procedure including the risks, benefits and alternatives for the proposed anesthesia with the patient or authorized representative who has indicated his/her understanding and acceptance.   Dental advisory given  Plan Discussed with: CRNA and Surgeon  Anesthesia Plan Comments:         Anesthesia Quick Evaluation

## 2012-05-15 NOTE — Discharge Instructions (Signed)
Negative pressure 125 mmHg continuous suction.   Post Anesthesia Home Care Instructions  Activity: Get plenty of rest for the remainder of the day. A responsible adult should stay with you for 24 hours following the procedure.  For the next 24 hours, DO NOT: -Drive a car -Advertising copywriter -Drink alcoholic beverages -Take any medication unless instructed by your physician -Make any legal decisions or sign important papers.  Meals: Start with liquid foods such as gelatin or soup. Progress to regular foods as tolerated. Avoid greasy, spicy, heavy foods. If nausea and/or vomiting occur, drink only clear liquids until the nausea and/or vomiting subsides. Call your physician if vomiting continues.  Special Instructions/Symptoms: Your throat may feel dry or sore from the anesthesia or the breathing tube placed in your throat during surgery. If this causes discomfort, gargle with warm salt water. The discomfort should disappear within 24 hours.   Vacuum-Assisted Closure Therapy Vacuum-assisted closure therapy uses a device that removes fluid and germs from wounds to help them heal. It is used on wounds that cannot be closed with stitches. They often heal slowly. Vacuum-assisted therapy helps the wound stay clean and healthy while the open wound slowly grows back together. Vacuum-assisted closure therapy uses a bandage (dressing) that is made of foam. It is put inside the wound. Then, a drape is placed over the wound. This drape sticks to your skin to keep air out. A tube is hooked up to a small pump and is attached to the drape. The pump sucks out the fluid and germs. Vacuum-assisted closure therapy can also help reduce the bad smell that comes from the wound. HOW DOES IT WORK?  The vacuum pump pulls fluid through the foam dressing. The fluid goes into the tube and away from the wound. The fluid then goes into a container. The fluid in the container must be replaced if it is full or at least once a  week, even if the container is not full. The pulling from the pump helps to close the wound and bring better circulation to the wound area. The foam dressing covers and protects the wound. It helps your wound heal faster.  HOW DOES IT FEEL?   You might feel a little pulling when the pump is on.  You might feel some discomfort when the dressing is taken off. CAN I MOVE AROUND WITH VACUUM-ASSISTED CLOSURE THERAPY? Yes, it has a backup battery which is used when the machine is not plugged in, as long as the battery is working, you can move freely. WHAT ARE SOME THINGS I MUST KNOW?  Do not turn off the pump yourself.  Do not take off the dressing yourself, unless instructed to do so by your caregiver.  You can wash or shower with the dressing. However, you cannot take the pump into the shower. Make sure the wound dressing is protected and covered with plastic. The wound area must stay dry.  Do not turn off the pump for more than 2 hours. If the pump is off for more than 2 hours, your nurse must change your dressing. THE ALARM IS SOUNDING! WHAT SHOULD I DO?   Stay calm.  Ask your caregiver what to do if the alarm goes off, she will give you instructions of what to do.  Call your caregiver right away if the alarm goes off and you cannot fix the problem. WHEN SHOULD I CALL FOR HELP?   You have very bad pain.  You have bleeding that will not stop.  Your wound smells bad.  You have redness, swelling, or fluid leaking from your wound.  Your alarm goes off and you do not know what to do.  You have a fever.  Your wound itches or hurts.  Your dressing changes are often painful or bleeding often occurs.  You have diarrhea.  You have a sore throat.  You have a rash.  You feel nauseous.  You feel dizzy or weak. HOW DO I GET READY TO GO HOME WITH A PUMP?  A trained caregiver will talk to you and answer your questions about your vacuum-assisted closure therapy before you go home.  She will explain what to expect. A caregiver will also come to your home to apply the pump and care for your wound. The at home caregiver will also be available for questions and will come back for the scheduled dressing changes as well as if you are having a problem. If you have questions or do not know what to do when you go home, talk to your caregiver. Document Released: 02/12/2008 Document Revised: 05/24/2011 Document Reviewed: 02/12/2011 [facility] Patient Information 2013 [place], [place].

## 2012-05-15 NOTE — Anesthesia Procedure Notes (Signed)
Procedure Name: Intubation Date/Time: 05/15/2012 12:31 PM Performed by: Norva Pavlov Pre-anesthesia Checklist: Patient identified, Emergency Drugs available, Suction available and Patient being monitored Patient Re-evaluated:Patient Re-evaluated prior to inductionOxygen Delivery Method: Circle System Utilized Preoxygenation: Pre-oxygenation with 100% oxygen Intubation Type: IV induction Ventilation: Mask ventilation without difficulty Laryngoscope Size: 3 and Mac Grade View: Grade I Tube type: Oral Tube size: 7.0 mm Number of attempts: 1 Airway Equipment and Method: stylet and oral airway Placement Confirmation: ETT inserted through vocal cords under direct vision,  positive ETCO2 and breath sounds checked- equal and bilateral Secured at: 22 cm Tube secured with: Tape Dental Injury: Teeth and Oropharynx as per pre-operative assessment

## 2012-05-15 NOTE — Op Note (Signed)
NAMECINZIA, DEVOS   MEDICAL RECORD NO.: [id number]   LOCATION: [facility] FACILITY: [facility]   PHYSICIAN: Wayland Denis, DO   DATE OF PROCEDURE: 05/15/2012   OPERATIVE REPORT   PREOPERATIVE DIAGNOSIS: Large abdominal wall ulcer.   POSTOPERATIVE DIAGNOSIS: Large abdominal wall ulcer.   PROCEDURE: Irrigation and debridement of abdominal wall ulcer, skin, subcutaneous tissue, and fat for purpose of placement of ACell and VAC.  ACell 3.5 g and a large VAC, the wound was 10 x 10 x 4 cm.   ATTENDING SURGEON: Wayland Denis, DO   ASSISTANT: Lazaro Arms, PA   ANESTHESIA: General.   INDICATION FOR PROCEDURE: The patient is a 57 year old female with an extremely large abdominal ulcer. She presents for debridement. She has undergone debridements in the past and presents for further surgical care. Risks and complications were reviewed.   DESCRIPTION OF PROCEDURE: The patient was taken to the operating room, placed on the operating room table in the supine position. General  anesthesia was administered. Once adequate, a time-out was called. All information was confirmed to be correct. She was prepped and draped in  the usual sterile fashion.  The pocket was irrigated with antibiotic solution and normal saline. There is a great improvement in the tissue.  There was partial incorporation of the Acell sheet.  ACell powder 3.5 g was placed in the area.  Adaptic was applied with Hydrogel and A VAC was applied to 125 mmHg pressure. The skin edges were re-approximated with 2-0 nylon suture. The patient did tolerate the procedure well. There were no complications. She was allowed to wake up, extubated, and taken to recovery in stable condition.

## 2012-05-15 NOTE — Interval H&P Note (Signed)
History and Physical Interval Note:  05/15/2012 11:56 AM  Julia West  has presented today for surgery, with the diagnosis of ABDOMINAL WOUND   The various methods of treatment have been discussed with the patient and family. After consideration of risks, benefits and other options for treatment, the patient has consented to  Procedure(s) with comments: IRRIGATION AND DEBRIDEMENT OF ABD WOUND SURGICAL PREP AND PLACEMENT OF VAC  (N/A) - IRRIGATION AND DEBRIDEMENT OF ABD WOUND SURGICAL PREP AND PLACEMENT OF VAC  as a surgical intervention .  The patient's history has been reviewed, patient examined, no change in status, stable for surgery.  I have reviewed the patient's chart and labs.  Questions were answered to the patient's satisfaction.     SANGER,CLAIRE

## 2012-05-15 NOTE — Brief Op Note (Signed)
05/15/2012  1:11 PM  PATIENT:  Julia West  57 y.o. female  PRE-OPERATIVE DIAGNOSIS:  Abdominal Wound  POST-OPERATIVE DIAGNOSIS:  Abdominal wound  PROCEDURE:  Procedure(s): IRRIGATION AND DEBRIDEMENT OF ABDOMINAL WOUND WITH POSSIBLE SURGICAL PREP AND PLACEMENT OF VAC (N/A)  SURGEON:  Surgeon(s) and Role:    * Claire Sanger, DO - Primary  PHYSICIAN ASSISTANT: Shawn Rayburn, PA  ASSISTANTS: none   ANESTHESIA:   general  EBL:  Total I/O In: 200 [I.V.:200] Out: -   BLOOD ADMINISTERED:none  DRAINS: none   LOCAL MEDICATIONS USED:  NONE  SPECIMEN:  No Specimen  DISPOSITION OF SPECIMEN:  N/A  COUNTS:  YES  TOURNIQUET:  * No tourniquets in log *  DICTATION: .Dragon Dictation  PLAN OF CARE: Discharge to home after PACU  PATIENT DISPOSITION:  PACU - hemodynamically stable.   Delay start of Pharmacological VTE agent (>24hrs) due to surgical blood loss or risk of bleeding: no

## 2012-05-16 ENCOUNTER — Encounter (HOSPITAL_BASED_OUTPATIENT_CLINIC_OR_DEPARTMENT_OTHER): Payer: Self-pay | Admitting: Plastic Surgery

## 2012-05-16 NOTE — Progress Notes (Signed)
NPO AFTER MN. ARRIVES AT 1115. NEEDS ISTAT. CURRENT EKG IN EPIC AND CHART. WILL TAKE METOPROLOL AM OF SURG. W/ SIP OF WATER. 

## 2012-05-16 NOTE — Op Note (Signed)
NAMEHUDSYN, BARICH NO.:  [id number]  MEDICAL RECORD NO.:  [id number]  LOCATION: [facility]  PHYSICIAN:  Wayland Denis, DO      DATE OF BIRTH:  1955-05-08  DATE OF PROCEDURE:  05/08/2012 DATE OF DISCHARGE:                              OPERATIVE REPORT   LOCATION:  [facility]  PREOPERATIVE DIAGNOSIS:  Large abdominal wall ulcer.  POSTOPERATIVE DIAGNOSIS:  Large abdominal wall ulcer.  PROCEDURE:  Irrigation and debridement of abdominal wall ulcer, subcutaneous tissue, and fat for purposes of preparation for placement of ACell and VAC, ACell powder and a 5 x 7 sheet use with a large VAC, wound size 17 x 11 x 7.  ATTENDING SURGEON:  Wayland Denis, DO  ASSISTANT:  Shawn Rayburn, PA-C  ANESTHESIA:  General.  INDICATION FOR PROCEDURE:  The patient is a 56 year old female who presents with a chronic large abdominal ulcer.  She has undergone multiple debridements and presents for further management.  Risks and complications were reviewed and included bleeding, pain, scar, and the need for further surgery.  DESCRIPTION OF PROCEDURE:  The patient was taken to the operating room, placed on the operating room table in the supine position.  General anesthesia was administered and once adequate, a time-out was called. All the information was confirmed to be correct.  She was prepped and draped in the usual sterile fashion.  A 10 blade was used, along with a pair of mets to debride the nonviable fat mostly in the lower right and upper left quadrant.  The debridement was less extensive than previously, but still required some fat debridement. The ACell had complete take which was very encouraging and the hemostasis needed was achieved with electrocautery.  The pocket was irrigated with copious amounts of antibiotic solution.  ACell powder was placed with a 5 x 7 ACell sheet that was tacked in  place with 5-0 Vicryl.  The VAC was then set at 125 mmHg pressure, and Adaptic was placed over the ACell, followed by Hydrogel and the VAC sponge and then Ioban was used to obtain a seal.  A 2-0 nylon was used to reapproximate the skin by providing a little bit of tension, so that she does not rotate laterally and an excellent seal was obtained.  She was allowed to wake up, extubated, and taken to recovery room in stable condition.     Wayland Denis, DO     CS/MEDQ  D:  05/15/2012  T:  05/15/2012  Job:  [id number]

## 2012-05-22 ENCOUNTER — Encounter (HOSPITAL_BASED_OUTPATIENT_CLINIC_OR_DEPARTMENT_OTHER): Payer: Self-pay | Admitting: Plastic Surgery

## 2012-05-22 ENCOUNTER — Ambulatory Visit (HOSPITAL_BASED_OUTPATIENT_CLINIC_OR_DEPARTMENT_OTHER)
Admission: RE | Admit: 2012-05-22 | Discharge: 2012-05-22 | Disposition: A | Discharge status: Home / Self Care | Payer: Medicare Other | Source: Ambulatory Visit | Attending: Plastic Surgery | Admitting: Plastic Surgery

## 2012-05-22 ENCOUNTER — Encounter (HOSPITAL_BASED_OUTPATIENT_CLINIC_OR_DEPARTMENT_OTHER)
Admission: RE | Disposition: A | Discharge status: Home / Self Care | Payer: Self-pay | Source: Ambulatory Visit | Attending: Plastic Surgery

## 2012-05-22 ENCOUNTER — Encounter (HOSPITAL_BASED_OUTPATIENT_CLINIC_OR_DEPARTMENT_OTHER): Payer: Self-pay | Admitting: Anesthesiology

## 2012-05-22 ENCOUNTER — Ambulatory Visit (HOSPITAL_BASED_OUTPATIENT_CLINIC_OR_DEPARTMENT_OTHER): Payer: Medicare Other | Admitting: Anesthesiology

## 2012-05-22 DIAGNOSIS — I252 Old myocardial infarction: Secondary | ICD-10-CM | POA: Insufficient documentation

## 2012-05-22 DIAGNOSIS — I2510 Atherosclerotic heart disease of native coronary artery without angina pectoris: Secondary | ICD-10-CM | POA: Insufficient documentation

## 2012-05-22 DIAGNOSIS — Z7982 Long term (current) use of aspirin: Secondary | ICD-10-CM | POA: Insufficient documentation

## 2012-05-22 DIAGNOSIS — D649 Anemia, unspecified: Secondary | ICD-10-CM | POA: Insufficient documentation

## 2012-05-22 DIAGNOSIS — Z87891 Personal history of nicotine dependence: Secondary | ICD-10-CM | POA: Insufficient documentation

## 2012-05-22 DIAGNOSIS — L98499 Non-pressure chronic ulcer of skin of other sites with unspecified severity: Secondary | ICD-10-CM | POA: Insufficient documentation

## 2012-05-22 DIAGNOSIS — E785 Hyperlipidemia, unspecified: Secondary | ICD-10-CM | POA: Insufficient documentation

## 2012-05-22 DIAGNOSIS — I10 Essential (primary) hypertension: Secondary | ICD-10-CM | POA: Insufficient documentation

## 2012-05-22 DIAGNOSIS — Z6841 Body Mass Index (BMI) 40.0 and over, adult: Secondary | ICD-10-CM | POA: Insufficient documentation

## 2012-05-22 DIAGNOSIS — Z79899 Other long term (current) drug therapy: Secondary | ICD-10-CM | POA: Insufficient documentation

## 2012-05-22 HISTORY — PX: INCISION AND DRAINAGE OF WOUND: SHX1803

## 2012-05-22 LAB — POCT I-STAT 4, (NA,K, GLUC, HGB,HCT)
Glucose, Bld: 92 mg/dL (ref 70–99)
Potassium: 4 mEq/L (ref 3.5–5.1)
Sodium: 138 mEq/L (ref 135–145)

## 2012-05-22 SURGERY — IRRIGATION AND DEBRIDEMENT WOUND
Anesthesia: General | Site: Abdomen | Wound class: Clean

## 2012-05-22 MED ORDER — LACTATED RINGERS IV SOLN
INTRAVENOUS | Status: DC
  Administered 2012-05-22: 100 mL/h via INTRAVENOUS
  Filled 2012-05-22: qty 1000

## 2012-05-22 MED ORDER — HYDROCODONE-ACETAMINOPHEN 5-325 MG PO TABS
1.0000 | ORAL_TABLET | Freq: Four times a day (QID) | ORAL | Status: AC | PRN
  Administered 2012-05-22: 1 via ORAL
  Filled 2012-05-22: qty 1

## 2012-05-22 MED ORDER — MIDAZOLAM HCL 5 MG/5ML IJ SOLN
INTRAMUSCULAR | Status: DC | PRN
  Administered 2012-05-22: 2 mg via INTRAVENOUS

## 2012-05-22 MED ORDER — ONDANSETRON HCL 4 MG/2ML IJ SOLN
INTRAMUSCULAR | Status: DC | PRN
  Administered 2012-05-22: 4 mg via INTRAVENOUS

## 2012-05-22 MED ORDER — FENTANYL CITRATE 0.05 MG/ML IJ SOLN
INTRAMUSCULAR | Status: DC | PRN
  Administered 2012-05-22: 80 ug via INTRAVENOUS
  Administered 2012-05-22: 50 ug via INTRAVENOUS

## 2012-05-22 MED ORDER — PROPOFOL 10 MG/ML IV BOLUS
INTRAVENOUS | Status: DC | PRN
  Administered 2012-05-22: 300 mg via INTRAVENOUS

## 2012-05-22 MED ORDER — DEXAMETHASONE SODIUM PHOSPHATE 4 MG/ML IJ SOLN
INTRAMUSCULAR | Status: DC | PRN
  Administered 2012-05-22: 10 mg via INTRAVENOUS

## 2012-05-22 MED ORDER — CEFAZOLIN SODIUM-DEXTROSE 2-3 GM-% IV SOLR
INTRAVENOUS | Status: DC | PRN
  Administered 2012-05-22: 2 g via INTRAVENOUS

## 2012-05-22 MED ORDER — SUCCINYLCHOLINE CHLORIDE 20 MG/ML IJ SOLN
INTRAMUSCULAR | Status: DC | PRN
  Administered 2012-05-22: 100 mg via INTRAVENOUS

## 2012-05-22 MED ORDER — KETOROLAC TROMETHAMINE 30 MG/ML IJ SOLN
INTRAMUSCULAR | Status: DC | PRN
  Administered 2012-05-22: 30 mg via INTRAVENOUS

## 2012-05-22 MED ORDER — ACETAMINOPHEN 10 MG/ML IV SOLN
INTRAVENOUS | Status: DC | PRN
  Administered 2012-05-22: 1000 mg via INTRAVENOUS

## 2012-05-22 MED ORDER — 0.9 % SODIUM CHLORIDE (POUR BTL) OPTIME
TOPICAL | Status: DC | PRN
  Administered 2012-05-22: 1000 mL

## 2012-05-22 MED ORDER — SODIUM CHLORIDE 0.9 % IR SOLN
Status: DC | PRN
  Administered 2012-05-22: 13:00:00

## 2012-05-22 SURGICAL SUPPLY — 94 items
BAG DECANTER FOR FLEXI CONT (MISCELLANEOUS) IMPLANT
BANDAGE ELASTIC 3 VELCRO ST LF (GAUZE/BANDAGES/DRESSINGS) IMPLANT
BANDAGE ELASTIC 4 VELCRO ST LF (GAUZE/BANDAGES/DRESSINGS) IMPLANT
BANDAGE ELASTIC 6 VELCRO ST LF (GAUZE/BANDAGES/DRESSINGS) IMPLANT
BANDAGE GAUZE ELAST BULKY 4 IN (GAUZE/BANDAGES/DRESSINGS) IMPLANT
BENZOIN TINCTURE PRP APPL 2/3 (GAUZE/BANDAGES/DRESSINGS) IMPLANT
BLADE MINI RND TIP GREEN BEAV (BLADE) IMPLANT
BLADE SURG 10 STRL SS (BLADE) ×2 IMPLANT
BLADE SURG 15 STRL LF DISP TIS (BLADE) ×2 IMPLANT
BLADE SURG 15 STRL SS (BLADE) ×2
BNDG COHESIVE 1X5 TAN STRL LF (GAUZE/BANDAGES/DRESSINGS) IMPLANT
BNDG COHESIVE 4X5 TAN NS LF (GAUZE/BANDAGES/DRESSINGS) ×2 IMPLANT
BNDG ESMARK 4X9 LF (GAUZE/BANDAGES/DRESSINGS) IMPLANT
CANISTER OMNI JUG 16 LITER (MISCELLANEOUS) IMPLANT
CANISTER SUCTION 1200CC (MISCELLANEOUS) IMPLANT
CANISTER SUCTION 2500CC (MISCELLANEOUS) IMPLANT
CHLORAPREP W/TINT 26ML (MISCELLANEOUS) IMPLANT
CLOTH BEACON ORANGE TIMEOUT ST (SAFETY) ×2 IMPLANT
CORDS BIPOLAR (ELECTRODE) IMPLANT
COVER MAYO STAND STRL (DRAPES) ×2 IMPLANT
COVER TABLE BACK 60X90 (DRAPES) ×2 IMPLANT
DECANTER SPIKE VIAL GLASS SM (MISCELLANEOUS) IMPLANT
DRAIN PENROSE 18X1/2 LTX STRL (DRAIN) ×2 IMPLANT
DRAPE EXTREMITY T 121X128X90 (DRAPE) IMPLANT
DRAPE INCISE IOBAN 66X45 STRL (DRAPES) ×2 IMPLANT
DRAPE LG THREE QUARTER DISP (DRAPES) IMPLANT
DRSG ADAPTIC 3X8 NADH LF (GAUZE/BANDAGES/DRESSINGS) ×6 IMPLANT
DRSG EMULSION OIL 3X3 NADH (GAUZE/BANDAGES/DRESSINGS) IMPLANT
DRSG PAD ABDOMINAL 8X10 ST (GAUZE/BANDAGES/DRESSINGS) IMPLANT
ELECT NEEDLE TIP 2.8 STRL (NEEDLE) IMPLANT
ELECT REM PT RETURN 9FT ADLT (ELECTROSURGICAL) ×2
ELECTRODE REM PT RTRN 9FT ADLT (ELECTROSURGICAL) ×1 IMPLANT
GAUZE SPONGE 4X4 12PLY STRL LF (GAUZE/BANDAGES/DRESSINGS) IMPLANT
GAUZE XEROFORM 1X8 LF (GAUZE/BANDAGES/DRESSINGS) IMPLANT
GAUZE XEROFORM 5X9 LF (GAUZE/BANDAGES/DRESSINGS) IMPLANT
GLOVE BIO SURGEON STRL SZ 6.5 (GLOVE) ×2 IMPLANT
GLOVE BIO SURGEON STRL SZ7 (GLOVE) ×2 IMPLANT
GLOVE ECLIPSE 7.0 STRL STRAW (GLOVE) ×2 IMPLANT
GOWN PREVENTION PLUS XLARGE (GOWN DISPOSABLE) ×2 IMPLANT
GOWN SURGICAL LARGE (GOWNS) ×4 IMPLANT
HANDPIECE INTERPULSE COAX TIP (DISPOSABLE)
IV NS IRRIG 3000ML ARTHROMATIC (IV SOLUTION) IMPLANT
MATRIX SURGICAL PSM 10X15CM (Tissue) ×2 IMPLANT
MICROMATRIX 1000MG (Tissue) ×4 IMPLANT
MICROMATRIX 500MG (Tissue) ×2 IMPLANT
MONTGOMERY STRAPS ×4 IMPLANT
NEEDLE 27GAX1X1/2 (NEEDLE) IMPLANT
NEEDLE HYPO 30GX1 BEV (NEEDLE) IMPLANT
NS IRRIG 1000ML POUR BTL (IV SOLUTION) ×2 IMPLANT
PACK BASIN DAY SURGERY FS (CUSTOM PROCEDURE TRAY) ×2 IMPLANT
PADDING CAST ABS 3INX4YD NS (CAST SUPPLIES)
PADDING CAST ABS 4INX4YD NS (CAST SUPPLIES)
PADDING CAST ABS COTTON 3X4 (CAST SUPPLIES) IMPLANT
PADDING CAST ABS COTTON 4X4 ST (CAST SUPPLIES) IMPLANT
PENCIL BUTTON HOLSTER BLD 10FT (ELECTRODE) ×2 IMPLANT
SET HNDPC FAN SPRY TIP SCT (DISPOSABLE) IMPLANT
SLEEVE SCD COMPRESS KNEE MED (MISCELLANEOUS) IMPLANT
SOLUTION PARTIC MCRMTRX 1000MG (Tissue) ×2 IMPLANT
SOLUTION PARTIC MCRMTRX 500MG (Tissue) ×1 IMPLANT
SPLINT PLASTER CAST XFAST 3X15 (CAST SUPPLIES) IMPLANT
SPLINT PLASTER XTRA FASTSET 3X (CAST SUPPLIES)
SPONGE GAUZE 4X4 12PLY (GAUZE/BANDAGES/DRESSINGS) ×2 IMPLANT
SPONGE LAP 18X18 X RAY DECT (DISPOSABLE) IMPLANT
SPONGE LAP 4X18 X RAY DECT (DISPOSABLE) IMPLANT
STAPLER VISISTAT 35W (STAPLE) IMPLANT
STOCKINETTE 4X48 STRL (DRAPES) IMPLANT
STOCKINETTE 6  STRL (DRAPES) ×1
STOCKINETTE 6 STRL (DRAPES) ×1 IMPLANT
STOCKINETTE IMPERVIOUS LG (DRAPES) IMPLANT
STRIP CLOSURE SKIN 1/2X4 (GAUZE/BANDAGES/DRESSINGS) IMPLANT
SUCTION FRAZIER TIP 10 FR DISP (SUCTIONS) IMPLANT
SURGILUBE 2OZ TUBE FLIPTOP (MISCELLANEOUS) IMPLANT
SUT ETHILON 3 0 PS 1 (SUTURE) IMPLANT
SUT ETHILON 4 0 P 3 18 (SUTURE) IMPLANT
SUT ETHILON 5 0 PS 2 18 (SUTURE) IMPLANT
SUT PROLENE 3 0 PS 2 (SUTURE) IMPLANT
SUT SILK 0 PSL NDL (SUTURE) ×6 IMPLANT
SUT SILK 3 0 PS 1 (SUTURE) IMPLANT
SUT VIC AB 3-0 FS2 27 (SUTURE) IMPLANT
SUT VIC AB 5-0 P-3 18X BRD (SUTURE) IMPLANT
SUT VIC AB 5-0 P3 18 (SUTURE)
SUT VIC AB 5-0 PS2 18 (SUTURE) IMPLANT
SYR BULB IRRIGATION 50ML (SYRINGE) IMPLANT
SYR CONTROL 10ML LL (SYRINGE) ×2 IMPLANT
TAPE HYPAFIX 6X30 (GAUZE/BANDAGES/DRESSINGS) ×4 IMPLANT
TIP FLEX 45CM EVICEL (HEMOSTASIS) IMPLANT
TIP RIGID 35CM EVICEL (HEMOSTASIS) IMPLANT
TOWEL OR 17X24 6PK STRL BLUE (TOWEL DISPOSABLE) ×4 IMPLANT
TRAY DSU PREP LF (CUSTOM PROCEDURE TRAY) ×2 IMPLANT
TUBE CONNECTING 12X1/4 (SUCTIONS) ×2 IMPLANT
UMBILICAL TAPE ×6 IMPLANT
UNDERPAD 30X30 INCONTINENT (UNDERPADS AND DIAPERS) ×2 IMPLANT
WATER STERILE IRR 1000ML POUR (IV SOLUTION) IMPLANT
YANKAUER SUCT BULB TIP NO VENT (SUCTIONS) IMPLANT

## 2012-05-22 NOTE — Anesthesia Procedure Notes (Signed)
Procedure Name: Intubation Date/Time: 05/22/2012 12:44 PM Performed by: Maris Berger T Pre-anesthesia Checklist: Patient identified, Emergency Drugs available, Suction available and Patient being monitored Patient Re-evaluated:Patient Re-evaluated prior to inductionOxygen Delivery Method: Circle System Utilized Preoxygenation: Pre-oxygenation with 100% oxygen Intubation Type: IV induction Ventilation: Mask ventilation without difficulty Laryngoscope Size: Mac and 3 Grade View: Grade I Tube type: Oral Tube size: 7.0 mm Number of attempts: 1 Airway Equipment and Method: stylet Placement Confirmation: ETT inserted through vocal cords under direct vision,  positive ETCO2 and breath sounds checked- equal and bilateral Secured at: 21 cm Tube secured with: Tape Dental Injury: Teeth and Oropharynx as per pre-operative assessment  Comments: Pre oxygenated to 99%.  Rapid desat on induction to 80's, easily ventilated with sat returning to 100% after holding peep

## 2012-05-22 NOTE — Op Note (Signed)
NAMEZENOLA, DEZARN   MEDICAL RECORD NO.: [id number]   LOCATION: [facility] FACILITY: [facility]   PHYSICIAN: Wayland Denis, DO   DATE OF PROCEDURE: 05/22/2012   OPERATIVE REPORT   PREOPERATIVE DIAGNOSIS: Large abdominal wall ulcer.   POSTOPERATIVE DIAGNOSIS: Large abdominal wall ulcer.   PROCEDURE: Placement of Acell and the VAC on abdominal wall ulcer.   ACell 3.5 g and a large VAC, the wound was 10 x 10 x .3.5 cm.   ATTENDING SURGEON: Wayland Denis, DO   ASSISTANT: Lazaro Arms, PA   ANESTHESIA: General.   INDICATION FOR PROCEDURE: The patient is a 57 year old female with an extremely large abdominal ulcer. She presents for debridement. She has undergone debridements in the past and presents for further surgical care. Risks and complications were reviewed.   DESCRIPTION OF PROCEDURE: The patient was taken to the operating room, placed on the operating room table in the supine position. General anesthesia was administered. Once adequate, a time-out was called. All information was confirmed to be correct. She was prepped and draped in the usual sterile fashion. The pocket was irrigated with antibiotic solution and normal saline. There is a great improvement in the tissue. There was partial incorporation of the Acell sheet. ACell powder 2.5 g and a 5 x 9 sheet was placed in the area. Adaptic was applied with Hydrogel and A VAC was applied to 125 mmHg pressure. The skin edges were approximated with 1-0 silk suture. The patient did tolerate the procedure well. There were no complications. She was allowed to wake up, extubated, and taken to recovery in stable condition.

## 2012-05-22 NOTE — Transfer of Care (Signed)
Immediate Anesthesia Transfer of Care Note  Patient: Julia West  Procedure(s) Performed: Procedure(s): IRRIGATION AND DEBRIDEMENT OF ABODOMINAL WOUND WITH POSSIBLE SURGICAL PREP AND VAC PLACEMENT (N/A)  Patient Location: PACU  Anesthesia Type:General  Level of Consciousness: awake, alert  and oriented  Airway & Oxygen Therapy: Patient Spontanous Breathing and Patient connected to nasal cannula oxygen  Post-op Assessment: Report given to PACU RN  Post vital signs: Reviewed and stable  Complications: No apparent anesthesia complications

## 2012-05-22 NOTE — Preoperative (Signed)
Beta Blockers   Reason not to administer Beta Blockers:Concurrent use of intravenous inotropic medications during the perioperative 

## 2012-05-22 NOTE — Interval H&P Note (Signed)
History and Physical Interval Note:  05/22/2012 11:04 AM  Julia West  has presented today for surgery, with the diagnosis of Abdominal wound  The various methods of treatment have been discussed with the patient and family. After consideration of risks, benefits and other options for treatment, the patient has consented to  Procedure(s): IRRIGATION AND DEBRIDEMENT OF ABODOMINAL WOUND WITH POSSIBLE SURGICAL PREP AND VAC PLACEMENT (N/A) as a surgical intervention .  The patient's history has been reviewed, patient examined, no change in status, stable for surgery.  I have reviewed the patient's chart and labs.  Questions were answered to the patient's satisfaction.     SANGER,CLAIRE

## 2012-05-22 NOTE — Anesthesia Preprocedure Evaluation (Addendum)
Anesthesia Evaluation  Patient identified by MRN, date of birth, ID band Patient awake    Reviewed: Allergy & Precautions, H&P , NPO status , Patient's Chart, lab work & pertinent test results, reviewed documented beta blocker date and time   Airway Mallampati: II TM Distance: >3 FB Neck ROM: full    Dental no notable dental hx.    Pulmonary former smoker,  breath sounds clear to auscultation  Pulmonary exam normal       Cardiovascular Exercise Tolerance: Good hypertension, On Medications and On Home Beta Blockers + CAD and + Past MI Rhythm:regular Rate:Normal  Normal stress test 01/17/12   Neuro/Psych negative neurological ROS  negative psych ROS   GI/Hepatic negative GI ROS, Neg liver ROS,   Endo/Other  Morbid obesity  Renal/GU negative Renal ROS  negative genitourinary   Musculoskeletal   Abdominal (+) + obese,   Peds  Hematology negative hematology ROS (+) anemia ,   Anesthesia Other Findings   Reproductive/Obstetrics negative OB ROS                          Anesthesia Physical Anesthesia Plan  ASA: III  Anesthesia Plan: General   Post-op Pain Management:    Induction:   Airway Management Planned: Oral ETT  Additional Equipment:   Intra-op Plan:   Post-operative Plan:   Informed Consent: I have reviewed the patients History and Physical, chart, labs and discussed the procedure including the risks, benefits and alternatives for the proposed anesthesia with the patient or authorized representative who has indicated his/her understanding and acceptance.   Dental Advisory Given  Plan Discussed with: CRNA  Anesthesia Plan Comments:         Anesthesia Quick Evaluation

## 2012-05-22 NOTE — H&P (View-Only) (Signed)
Julia West is an 57 y.o. female.   Chief Complaint: abdominal ulcer HPI: The patient is a 57 yrs old wf being treated for a massive abdominal ulcer.  She has undergone multiple debridements over the past several weeks.  Acell and the [facility] were placed with improvement noted at the last debridement.  She is tolerating the VAC well at home and is starting show granulation formation at the base of the ulcer.  Past Medical History  Diagnosis Date  . Morbid obesity   . Other and unspecified hyperlipidemia   . Unspecified essential hypertension   . Acute myocardial infarction     with a ruptured plaque in the circumflex in 2003  . Degenerative joint disease   . Internal and external hemorrhoids without complication   . Anxiety   . Coronary artery disease   . Complication of anesthesia     states low O2 sats post-op 11/13  . Surgical wound, non healing ABDOMINAL     has wound vac @ 125 mm Hg    Past Surgical History  Procedure Laterality Date  . Cesarean section      x 4 in remote past  . Cholecystectomy    . Coronary artery bypass graft  2003    by Dr. Tyrone Sage. LIMA to the LAD, free RIMA to the circumflex. Stress perfusion study December 2009 with no high-risk areas of ischemia. She has a well-preserved ejection fraction  . Incisional hernia repair  02/07/2012    Procedure: HERNIA REPAIR INCISIONAL;  Surgeon: [name], MD,FACS;  Location: [facility] OR;  Service: General;;  Open, Primary repair, strangulated Incisional hernia.  . Bowel resection  02/07/2012    Procedure: SMALL BOWEL RESECTION;  Surgeon: [name], MD,FACS;  Location: [facility] OR;  Service: General;;  . Application of wound vac    . Incision and drainage of wound  04/17/2012    Procedure: IRRIGATION AND DEBRIDEMENT WOUND;  Surgeon: Wayland Denis, DO;  Location: [facility];  Service: Plastics;  Laterality: N/A;  OF ABDOMINAL WOUND, SURGICAL PREP AND PLACEMENT OF VAC, REMOVAL FOREHEAD SKIN LESION  . Lesion  removal  04/17/2012    Procedure: LESION REMOVAL;  Surgeon: Wayland Denis, DO;  Location: [facility];  Service: Plastics;  Laterality: N/A;   CENTER OF FOREHEAD, REMOVAL FORHEAD SKIN LESION  . Incision and drainage of wound N/A 04/24/2012    Procedure: IRRIGATION AND DEBRIDEMENT WOUND;  Surgeon: Wayland Denis, DO;  Location: [facility];  Service: Plastics;  Laterality: N/A;  I & D ABDOMINAL WOUND WITH VAC AND ACELL  . Incision and drainage of wound N/A 05/01/2012    Procedure: IRRIGATION AND DEBRIDEMENT WOUND;  Surgeon: Wayland Denis, DO;  Location: [facility];  Service: Plastics;  Laterality: N/A;  WITH SURGICAL PREP AND PLACEMENT OF VAC  . Incision and drainage of wound N/A 05/08/2012    Procedure: IRRIGATION AND DEBRIDEMENT OF ABD WOUND SURGICAL PREP AND PLACEMENT OF VAC ;  Surgeon: Wayland Denis, DO;  Location: [facility];  Service: Plastics;  Laterality: N/A;  IRRIGATION AND DEBRIDEMENT OF ABD WOUND SURGICAL PREP AND PLACEMENT OF VAC   . Cardiovascular stress test  01-17-2012  DR [facility]    LOW RISK NUCLEAR TEST    Family History  Problem Relation Age of Onset  . Heart attack Mother   . Hypertension Sister    Social History:  reports that she quit smoking about 5 months ago. Her smoking use  included Cigarettes. She has a 25 pack-year smoking history. She has never used smokeless tobacco. She reports that she does not drink alcohol or use illicit drugs.  Allergies:  Allergies  Allergen Reactions  . Ace Inhibitors Cough    Medications Prior to Admission  Medication Sig Dispense Refill  . aspirin 81 MG tablet Take 81 mg by mouth daily.       . beta carotene w/minerals (OCUVITE) tablet Take 1 tablet by mouth daily.      . citalopram (CELEXA) 20 MG tablet Take 20 mg by mouth daily.      . diclofenac (VOLTAREN) 75 MG EC tablet Take 75 mg by mouth 2 (two) times daily.        Marland Kitchen doxycycline (DORYX) 100 MG EC tablet Take 100 mg by  mouth 2 (two) times daily.      . Fe Fum-FePoly-Vit C-Vit B3 (INTEGRA PO) Take by mouth.      . fish oil-omega-3 fatty acids 1000 MG capsule Take 2 g by mouth daily.        . furosemide (LASIX) 20 MG tablet Take 10 mg by mouth daily.        Marland Kitchen HYDROcodone-acetaminophen (NORCO/VICODIN) 5-325 MG per tablet Take 1 tablet by mouth every 6 (six) hours as needed for pain.  40 tablet  0  . metoprolol (LOPRESSOR) 50 MG tablet Take 50 mg by mouth 2 (two) times daily.        . rosuvastatin (CRESTOR) 10 MG tablet Take 10 mg by mouth daily.        . valsartan-hydrochlorothiazide (DIOVAN-HCT) 160-12.5 MG per tablet Take 1 tablet by mouth daily.      . vitamin C (ASCORBIC ACID) 500 MG tablet Take 500 mg by mouth daily.      Marland Kitchen zinc gluconate 50 MG tablet Take 50 mg by mouth daily.      Marland Kitchen zolpidem (AMBIEN) 10 MG tablet Take 10 mg by mouth at bedtime as needed. For sleep      . ALPRAZolam (XANAX) 0.25 MG tablet Take 0.25 mg by mouth at bedtime as needed for sleep.        No results found for this or any previous visit (from the past 48 hour(s)). No results found.  Review of Systems  Constitutional: Negative.   HENT: Negative.   Eyes: Negative.   Respiratory: Negative.   Cardiovascular: Negative.   Gastrointestinal: Negative.   Genitourinary: Negative.   Musculoskeletal: Negative.   Skin: Negative.   Psychiatric/Behavioral: Negative.     Blood pressure 134/69, pulse 56, temperature 97.3 F (36.3 C), temperature source Oral, resp. rate 18, weight 126.554 kg (279 lb), SpO2 98.00%. Physical Exam  Constitutional: She appears well-developed.  HENT:  Head: Normocephalic and atraumatic.  Eyes: Pupils are equal, round, and reactive to light.  Cardiovascular: Normal rate.   Respiratory: Effort normal.  GI: Soft. There is tenderness.    Skin: Skin is warm. There is erythema.  Psychiatric: She has a normal mood and affect. Her behavior is normal. Thought content normal.     Assessment/Plan Chronic  Abdominal ulcer - Irrigation and debridement of the ulcer with placement of Acell and the VAC.  SANGER,CLAIRE 05/15/2012, 10:44 AM

## 2012-05-22 NOTE — Brief Op Note (Signed)
05/22/2012  1:23 PM  PATIENT:  Julia West  57 y.o. female  PRE-OPERATIVE DIAGNOSIS:  Chronic Abdominal ulcer  POST-OPERATIVE DIAGNOSIS:  Chronic abdominal ulcer  PROCEDURE:  Procedure(s): IRRIGATION AND DEBRIDEMENT OF ABODOMINAL WOUND WITH POSSIBLE SURGICAL PREP AND VAC PLACEMENT (N/A)  SURGEON:  Surgeon(s) and Role:    * Claire Sanger, DO - Primary  PHYSICIAN ASSISTANT: Shawn Rayburn, PA  ASSISTANTS: none   ANESTHESIA:   general  EBL:  Total I/O In: 200 [I.V.:200] Out: -   BLOOD ADMINISTERED:none  DRAINS: none   LOCAL MEDICATIONS USED:  NONE  SPECIMEN:  No Specimen  DISPOSITION OF SPECIMEN:  N/A  COUNTS:  YES  TOURNIQUET:  * No tourniquets in log *  DICTATION: .Dragon Dictation  PLAN OF CARE: Discharge to home after PACU  PATIENT DISPOSITION:  PACU - hemodynamically stable.   Delay start of Pharmacological VTE agent (>24hrs) due to surgical blood loss or risk of bleeding: no

## 2012-05-23 ENCOUNTER — Encounter (HOSPITAL_BASED_OUTPATIENT_CLINIC_OR_DEPARTMENT_OTHER): Payer: Self-pay | Admitting: Plastic Surgery

## 2012-05-23 NOTE — Anesthesia Postprocedure Evaluation (Signed)
  Anesthesia Post-op Note  Patient: Julia West  Procedure(s) Performed: Procedure(s) (LRB): IRRIGATION AND DEBRIDEMENT OF ABODOMINAL WOUND WITH  SURGICAL PREP AND VAC PLACEMENT (N/A)  Patient Location: PACU  Anesthesia Type: General  Level of Consciousness: awake and alert   Airway and Oxygen Therapy: Patient Spontanous Breathing  Post-op Pain: mild  Post-op Assessment: Post-op Vital signs reviewed, Patient's Cardiovascular Status Stable, Respiratory Function Stable, Patent Airway and No signs of Nausea or vomiting  Last Vitals:  Filed Vitals:   05/22/12 1607  BP: 134/71  Pulse:   Temp: 36.2 C  Resp: 18    Post-op Vital Signs: stable   Complications: No apparent anesthesia complications

## 2012-05-26 NOTE — Addendum Note (Signed)
Addendum created 05/26/12 1157 by Azell Der, MD   Modules edited: Anesthesia Responsible Staff

## 2012-05-29 ENCOUNTER — Encounter (HOSPITAL_BASED_OUTPATIENT_CLINIC_OR_DEPARTMENT_OTHER): Payer: Medicare Other | Attending: Plastic Surgery

## 2012-05-29 DIAGNOSIS — L98499 Non-pressure chronic ulcer of skin of other sites with unspecified severity: Secondary | ICD-10-CM | POA: Insufficient documentation

## 2012-06-05 ENCOUNTER — Emergency Department (HOSPITAL_COMMUNITY)
Admission: EM | Admit: 2012-06-05 | Discharge: 2012-06-05 | Disposition: A | Discharge status: Home / Self Care | Payer: Medicare Other | Attending: Emergency Medicine | Admitting: Emergency Medicine

## 2012-06-05 ENCOUNTER — Encounter (HOSPITAL_COMMUNITY): Payer: Self-pay | Admitting: *Deleted

## 2012-06-05 DIAGNOSIS — Y9289 Other specified places as the place of occurrence of the external cause: Secondary | ICD-10-CM | POA: Insufficient documentation

## 2012-06-05 DIAGNOSIS — I10 Essential (primary) hypertension: Secondary | ICD-10-CM | POA: Insufficient documentation

## 2012-06-05 DIAGNOSIS — I2510 Atherosclerotic heart disease of native coronary artery without angina pectoris: Secondary | ICD-10-CM | POA: Insufficient documentation

## 2012-06-05 DIAGNOSIS — Z8739 Personal history of other diseases of the musculoskeletal system and connective tissue: Secondary | ICD-10-CM | POA: Insufficient documentation

## 2012-06-05 DIAGNOSIS — Z79899 Other long term (current) drug therapy: Secondary | ICD-10-CM | POA: Insufficient documentation

## 2012-06-05 DIAGNOSIS — W010XXA Fall on same level from slipping, tripping and stumbling without subsequent striking against object, initial encounter: Secondary | ICD-10-CM | POA: Insufficient documentation

## 2012-06-05 DIAGNOSIS — Z8679 Personal history of other diseases of the circulatory system: Secondary | ICD-10-CM | POA: Insufficient documentation

## 2012-06-05 DIAGNOSIS — Z87891 Personal history of nicotine dependence: Secondary | ICD-10-CM | POA: Insufficient documentation

## 2012-06-05 DIAGNOSIS — Y939 Activity, unspecified: Secondary | ICD-10-CM | POA: Insufficient documentation

## 2012-06-05 DIAGNOSIS — Z7982 Long term (current) use of aspirin: Secondary | ICD-10-CM | POA: Insufficient documentation

## 2012-06-05 DIAGNOSIS — W19XXXA Unspecified fall, initial encounter: Secondary | ICD-10-CM

## 2012-06-05 DIAGNOSIS — S0990XA Unspecified injury of head, initial encounter: Secondary | ICD-10-CM | POA: Insufficient documentation

## 2012-06-05 DIAGNOSIS — B749 Filariasis, unspecified: Secondary | ICD-10-CM | POA: Insufficient documentation

## 2012-06-05 DIAGNOSIS — I252 Old myocardial infarction: Secondary | ICD-10-CM | POA: Insufficient documentation

## 2012-06-05 DIAGNOSIS — IMO0002 Reserved for concepts with insufficient information to code with codable children: Secondary | ICD-10-CM | POA: Insufficient documentation

## 2012-06-05 DIAGNOSIS — E785 Hyperlipidemia, unspecified: Secondary | ICD-10-CM | POA: Insufficient documentation

## 2012-06-05 DIAGNOSIS — F411 Generalized anxiety disorder: Secondary | ICD-10-CM | POA: Insufficient documentation

## 2012-06-05 NOTE — ED Provider Notes (Signed)
History     CSN: [id number]  Arrival date & time 06/05/12  1120   First MD Initiated Contact with Patient 06/05/12 1130      Chief Complaint  Patient presents with  . Fall    (Consider location/radiation/quality/duration/timing/severity/associated sxs/prior treatment) HPI Comments: This is a 57 year old female, no pertinent past medical history, who presents emergency department with chief complaint of fall. Patient was just finishing up treatment at the wound care Center, when she tripped over the curb and fell to the ground. She struck her head and face on the concrete. She denies being in any pain at this time. Denies loss of consciousness. She has not tried anything to alleviate her symptoms. Pain does not radiate.  The history is provided by the patient. No language interpreter was used.    Past Medical History  Diagnosis Date  . Morbid obesity   . Other and unspecified hyperlipidemia   . Unspecified essential hypertension   . Acute myocardial infarction     with a ruptured plaque in the circumflex in 2003  . Degenerative joint disease   . Internal and external hemorrhoids without complication   . Anxiety   . Coronary artery disease   . Complication of anesthesia     states low O2 sats post-op 11/13  . Surgical wound, non healing ABDOMINAL     has wound vac @ 125 mm Hg    Past Surgical History  Procedure Laterality Date  . Cesarean section      x 4 in remote past  . Cholecystectomy    . Coronary artery bypass graft  2003    by Dr. Tyrone Sage. LIMA to the LAD, free RIMA to the circumflex. Stress perfusion study December 2009 with no high-risk areas of ischemia. She has a well-preserved ejection fraction  . Incisional hernia repair  02/07/2012    Procedure: HERNIA REPAIR INCISIONAL;  Surgeon: [name], MD,FACS;  Location: [facility] OR;  Service: General;;  Open, Primary repair, strangulated Incisional hernia.  . Bowel resection  02/07/2012    Procedure: SMALL BOWEL  RESECTION;  Surgeon: [name], MD,FACS;  Location: [facility] OR;  Service: General;;  . Application of wound vac    . Incision and drainage of wound  04/17/2012    Procedure: IRRIGATION AND DEBRIDEMENT WOUND;  Surgeon: Wayland Denis, DO;  Location: [facility] [place];  Service: Plastics;  Laterality: N/A;  OF ABDOMINAL WOUND, SURGICAL PREP AND PLACEMENT OF VAC, REMOVAL FOREHEAD SKIN LESION  . Lesion removal  04/17/2012    Procedure: LESION REMOVAL;  Surgeon: Wayland Denis, DO;  Location: [facility] [place];  Service: Plastics;  Laterality: N/A;   CENTER OF FOREHEAD, REMOVAL FORHEAD SKIN LESION  . Incision and drainage of wound N/A 04/24/2012    Procedure: IRRIGATION AND DEBRIDEMENT WOUND;  Surgeon: Wayland Denis, DO;  Location: [facility] [place];  Service: Plastics;  Laterality: N/A;  I & D ABDOMINAL WOUND WITH VAC AND ACELL  . Incision and drainage of wound N/A 05/01/2012    Procedure: IRRIGATION AND DEBRIDEMENT WOUND;  Surgeon: Wayland Denis, DO;  Location: [facility] [place];  Service: Plastics;  Laterality: N/A;  WITH SURGICAL PREP AND PLACEMENT OF VAC  . Incision and drainage of wound N/A 05/08/2012    Procedure: IRRIGATION AND DEBRIDEMENT OF ABD WOUND SURGICAL PREP AND PLACEMENT OF VAC ;  Surgeon: Wayland Denis, DO;  Location: [facility];  Service: Plastics;  Laterality: N/A;  IRRIGATION AND DEBRIDEMENT OF ABD WOUND  SURGICAL PREP AND PLACEMENT OF VAC   . Cardiovascular stress test  01-17-2012  DR HOCHREIN    LOW RISK NUCLEAR TEST  . Incision and drainage of wound N/A 05/15/2012    Procedure: IRRIGATION AND DEBRIDEMENT OF ABDOMINAL WOUND WITH POSSIBLE SURGICAL PREP AND PLACEMENT OF VAC;  Surgeon: Wayland Denis, DO;  Location: [facility];  Service: Plastics;  Laterality: N/A;  . Incision and drainage of wound N/A 05/22/2012    Procedure: IRRIGATION AND DEBRIDEMENT OF ABODOMINAL WOUND WITH  SURGICAL PREP AND VAC PLACEMENT;  Surgeon: Wayland Denis, DO;  Location: [facility];  Service: Plastics;  Laterality: N/A;    Family History  Problem Relation Age of Onset  . Heart attack Mother   . Hypertension Sister     History  Substance Use Topics  . Smoking status: Former Smoker -- 1.00 packs/day for 25 years    Types: Cigarettes    Quit date: 11/17/2011  . Smokeless tobacco: Never Used  . Alcohol Use: No    OB History   Grav Para Term Preterm Abortions TAB SAB Ect Mult Living                  Review of Systems  All other systems reviewed and are negative.    Allergies  Ace inhibitors  Home Medications   Current Outpatient Rx  Name  Route  Sig  Dispense  Refill  . ALPRAZolam (XANAX) 0.25 MG tablet   Oral   Take 0.25 mg by mouth at bedtime as needed for sleep.         Marland Kitchen aspirin 81 MG tablet   Oral   Take 81 mg by mouth daily.          . citalopram (CELEXA) 20 MG tablet   Oral   Take 20 mg by mouth daily.         . Fe Fum-FePoly-Vit C-Vit B3 (INTEGRA PO)   Oral   Take by mouth.         . ferrous sulfate 325 (65 FE) MG tablet   Oral   Take 325 mg by mouth daily with breakfast.         . fish oil-omega-3 fatty acids 1000 MG capsule   Oral   Take 2 g by mouth daily.           . furosemide (LASIX) 20 MG tablet   Oral   Take 10 mg by mouth daily.           Marland Kitchen HYDROcodone-acetaminophen (NORCO/VICODIN) 5-325 MG per tablet   Oral   Take 1 tablet by mouth every 6 (six) hours as needed for pain.   40 tablet   0   . ibuprofen (ADVIL,MOTRIN) 200 MG tablet   Oral   Take 600 mg by mouth daily. Pain         . metoprolol (LOPRESSOR) 50 MG tablet   Oral   Take 50 mg by mouth 2 (two) times daily.           . penicillin v potassium (VEETID) 500 MG tablet   Oral   Take 500 mg by mouth 2 (two) times daily.         . rosuvastatin (CRESTOR) 10 MG tablet   Oral   Take 10 mg by mouth at bedtime.          . valsartan-hydrochlorothiazide (DIOVAN-HCT) 160-12.5 MG per  tablet   Oral   Take 1 tablet by mouth  daily.         . vitamin C (ASCORBIC ACID) 500 MG tablet   Oral   Take 500 mg by mouth daily.         Marland Kitchen zinc gluconate 50 MG tablet   Oral   Take 50 mg by mouth daily.         Marland Kitchen zolpidem (AMBIEN) 10 MG tablet   Oral   Take 10 mg by mouth at bedtime as needed. For sleep           BP 93/55  Pulse 59  Temp(Src) 98.9 F (37.2 C) (Oral)  Resp 18  SpO2 97%  Physical Exam  Nursing note and vitals reviewed. Constitutional: She is oriented to person, place, and time. She appears well-developed and well-nourished.  HENT:  Head: Normocephalic and atraumatic.  No bony tenderness over the facial bones, no obvious deformities  Eyes: Conjunctivae and EOM are normal. Pupils are equal, round, and reactive to light.  Neck: Normal range of motion. Neck supple.  Cardiovascular: Normal rate and regular rhythm.  Exam reveals no gallop and no friction rub.   No murmur heard. Pulmonary/Chest: Effort normal and breath sounds normal. No respiratory distress. She has no wheezes. She has no rales. She exhibits no tenderness.  Abdominal: Soft. Bowel sounds are normal. She exhibits no distension and no mass. There is no tenderness. There is no rebound and no guarding.  Musculoskeletal: Normal range of motion. She exhibits no edema and no tenderness.  Neurological: She is alert and oriented to person, place, and time.  Skin: Skin is warm and dry.  Abrasions to left cheek and left forehead, without evidence of foreign body, bleeding is controlled, no deep laceration  Psychiatric: She has a normal mood and affect. Her behavior is normal. Judgment and thought content normal.    ED Course  Procedures (including critical care time)  Labs Reviewed - No data to display No results found.   1. Fall, initial encounter       MDM  57 year old female with mechanical fall. She has some abrasions on her face, no bony tenderness or deformities. No imaging  warranted at this time based on [place] head CT rules. No loss of consciousness. Patient states that she feels fine, and was just told to come to the emergency department by her wound care doctor. She is stable and ready for discharge.       Roxy Horseman, PA-C 06/05/12 1239  Roxy Horseman, PA-C 06/05/12 1239

## 2012-06-05 NOTE — ED Notes (Signed)
Pt reports tripping on a cement block today falling.  Abrasions noted on L side of her face and nose.  Pt denies any pain anywhere else.  Pt also denies LOC at this time.

## 2012-06-05 NOTE — ED Provider Notes (Signed)
Medical screening examination/treatment/procedure(s) were performed by non-physician practitioner and as supervising physician I was immediately available for consultation/collaboration.   Lyanne Co, MD 06/05/12 [phone]

## 2012-06-06 ENCOUNTER — Other Ambulatory Visit: Payer: Self-pay | Admitting: Nurse Practitioner

## 2012-06-06 MED ORDER — ALPRAZOLAM 0.25 MG PO TABS: 0.2500 mg | ORAL_TABLET | Freq: Every evening | ORAL | Status: DC | PRN

## 2012-06-08 ENCOUNTER — Other Ambulatory Visit: Payer: Self-pay | Admitting: *Deleted

## 2012-06-08 MED ORDER — ALPRAZOLAM 0.25 MG PO TABS: 0.2500 mg | ORAL_TABLET | Freq: Two times a day (BID) | ORAL | Status: DC | PRN

## 2012-06-08 NOTE — Telephone Encounter (Signed)
Phone in RX. Let patient know

## 2012-06-13 NOTE — Progress Notes (Signed)
[facility]  NAME:  Julia West, Julia West NO.:  [id number]  MEDICAL RECORD NO.:  [id number]      DATE OF BIRTH:  May 04, 1955  PHYSICIAN:  Wayland Denis, DO       VISIT DATE:  06/12/2012                                  OFFICE VISIT   The patient is a 57 year old female who is here for followup on her chronic abdominal ulcer.  She is doing extremely well.  The area has improved remarkably filling in both in depth and in overall width.  We will continue with the [facility] and we will see her back in a week.     Wayland Denis, DO     CS/MEDQ  D:  06/12/2012  T:  06/13/2012  Job:  [id number]

## 2012-06-13 NOTE — Op Note (Deleted)
NAME:  Julia West, Julia West NO.:  [id number]  MEDICAL RECORD NO.:  [id number]  LOCATION: [facility]  FACILITY:  [facility]  PHYSICIAN:  Wayland Denis, DO      DATE OF BIRTH:  04/02/1955  DATE OF PROCEDURE:  05/29/2012 DATE OF DISCHARGE:                              OPERATIVE REPORT   PREOPERATIVE DIAGNOSIS:  Large abdominal wall ulcer.  POSTOPERATIVE DIAGNOSIS:  Large abdominal wall ulcer.  PROCEDURE:  Placement of ACell and VAC on abdominal wall ulcer.  Wound size 17 x 11 x 7 cm.  ATTENDING SURGEON:  Wayland Denis, DO  ASSISTANT:  Lazaro Arms, P.A.  ANESTHESIA:  General.  INDICATION FOR PROCEDURE:  The patient is a 57 year old white female, who has a chronic large abdominal ulcer.  She is undergoing serial debridements with irrigation, debridement, and placement of ACell.  She is showing signs of improvement.  DESCRIPTION OF PROCEDURE:  The patient was taken to the operating room and placed on the operating room table in supine position.  General anesthesia was administered.  Once anesthesia was adequate, a time-out was called and all the information was confirmed to be correct.  She was prepped and draped in the usual sterile fashion.  A 10 blade was used to debride any nonviable tissue, or necrosis of the fat area, which was mostly in the right lower and left upper quadrant.  Copious amounts of irrigation was used to irrigate the wound with antibiotic solution and warm saline.  Once that was complete, the ACell powder was placed in the wound with a sheet 5 x 7 cm.  The sheet was tacked in place with 5-0 Vicryl.  Adaptic was then applied with Hydrogel and the VAC was set at 125 mmHg pressure.  2-0 nylon was used to reapproximate the skin to some extent and improved and the size.  The patient tolerated the procedure well.  There were no complications.  The patient was allowed to wake up, extubated, and taken to recovery room in stable  condition.     Wayland Denis, DO     CS/MEDQ  D:  06/12/2012  T:  06/13/2012  Job:  [id number]

## 2012-06-13 NOTE — Op Note (Signed)
NAME:  West, Julia           ACCOUNT NO.:  [id number]  MEDICAL RECORD NO.:  [id number]  LOCATION: [facility]  FACILITY:  [facility]  PHYSICIAN:  Mykaela Arena Sanger, DO      DATE OF BIRTH:  01/09/1956  DATE OF PROCEDURE:  05/29/2012 DATE OF DISCHARGE:                              OPERATIVE REPORT   PREOPERATIVE DIAGNOSIS:  Large abdominal wall ulcer.  POSTOPERATIVE DIAGNOSIS:  Large abdominal wall ulcer.  PROCEDURE:  Placement of ACell and VAC on abdominal wall ulcer.  Wound size 17 x 11 x 7 cm.  ATTENDING SURGEON:  Aayan Haskew Sanger, DO  ASSISTANT:  Shawn Rayburn, P.A.  ANESTHESIA:  General.  INDICATION FOR PROCEDURE:  The patient is a 57-year-old white female, who has a chronic large abdominal ulcer.  She is undergoing serial debridements with irrigation, debridement, and placement of ACell.  She is showing signs of improvement.  DESCRIPTION OF PROCEDURE:  The patient was taken to the operating room and placed on the operating room table in supine position.  General anesthesia was administered.  Once anesthesia was adequate, a time-out was called and all the information was confirmed to be correct.  She was prepped and draped in the usual sterile fashion.  A 10 blade was used to debride any nonviable tissue, or necrosis of the fat area, which was mostly in the right lower and left upper quadrant.  Copious amounts of irrigation was used to irrigate the wound with antibiotic solution and warm saline.  Once that was complete, the ACell powder was placed in the wound with a sheet 5 x 7 cm.  The sheet was tacked in place with 5-0 Vicryl.  Adaptic was then applied with Hydrogel and the VAC was set at 125 mmHg pressure.  2-0 nylon was used to reapproximate the skin to some extent and improved and the size.  The patient tolerated the procedure well.  There were no complications.  The patient was allowed to wake up, extubated, and taken to recovery room in stable  condition.     Alby Schwabe Sanger, DO     CS/MEDQ  D:  06/12/2012  T:  06/13/2012  Job:  [id number] 

## 2012-06-19 ENCOUNTER — Encounter (HOSPITAL_BASED_OUTPATIENT_CLINIC_OR_DEPARTMENT_OTHER): Payer: Medicare Other | Attending: Plastic Surgery

## 2012-06-19 DIAGNOSIS — L98499 Non-pressure chronic ulcer of skin of other sites with unspecified severity: Secondary | ICD-10-CM | POA: Insufficient documentation

## 2012-06-20 ENCOUNTER — Other Ambulatory Visit: Payer: Self-pay | Admitting: *Deleted

## 2012-06-20 MED ORDER — ZOLPIDEM TARTRATE 10 MG PO TABS: 10.0000 mg | ORAL_TABLET | Freq: Every day | ORAL | Status: DC

## 2012-06-20 NOTE — Telephone Encounter (Signed)
rx called in

## 2012-06-20 NOTE — Telephone Encounter (Signed)
Pt last seen on 05-24-12. Ambien last filled on 05-18-12 for #30. Please advise. If approved please have nurse call in to [facility] in [place]. Thank you

## 2012-06-20 NOTE — Telephone Encounter (Signed)
Pleae call in [facility] with 1 refill

## 2012-06-20 NOTE — Progress Notes (Signed)
[facility]  NAME:  TRENIYA, LOBB NO.:  [id number]  MEDICAL RECORD NO.:  [id number]      DATE OF BIRTH:  12-Feb-1956  PHYSICIAN:  Wayland Denis, DO       VISIT DATE:  06/19/2012                                  OFFICE VISIT   The patient is a 57 year old female who is here for followup on her abdominal ulcer.  She is doing extremely well.  She has been using the VAC.  PHYSICAL EXAMINATION:  She is very pleased with her progress.  The area is granulating in and taken down on the lateral aspect.  We will continue with the [facility] and plan to take her back to the OR for just some standard irrigation and debridement, and placement of ACell and the VAC.     Wayland Denis, DO     CS/MEDQ  D:  06/19/2012  T:  06/20/2012  Job:  [id number]

## 2012-06-22 NOTE — Progress Notes (Signed)
NPO AFTER MN. ARRIVES AT 0800. NEEDS ISTAT. WILL TAKE METOPROLOL AM OF SURG W/ SIP OF WATER. CURRENT EKG IN EPIC.

## 2012-06-23 ENCOUNTER — Other Ambulatory Visit: Payer: Self-pay | Admitting: Plastic Surgery

## 2012-06-23 DIAGNOSIS — L98493 Non-pressure chronic ulcer of skin of other sites with necrosis of muscle: Secondary | ICD-10-CM

## 2012-06-27 NOTE — Progress Notes (Signed)
[facility]  NAME:  Julia West, Julia West NO.:  [id number]  MEDICAL RECORD NO.:  [id number]      DATE OF BIRTH:  1955-10-19  PHYSICIAN:  Wayland Denis, DO       VISIT DATE:  06/26/2012                                  OFFICE VISIT   The patient is a 57 year old female who is here for followup on her abdominal ulcer.  She is doing incredibly well with marked improvement in the overall size.  We will continue with the VAC.  We planned on taking her to the OR on Wednesday for more debridement and ACell and VAC, and we will see her back in a week.     Wayland Denis, DO     CS/MEDQ  D:  06/26/2012  T:  06/27/2012  Job:  [id number]

## 2012-06-28 ENCOUNTER — Ambulatory Visit (HOSPITAL_BASED_OUTPATIENT_CLINIC_OR_DEPARTMENT_OTHER): Payer: Medicare Other | Admitting: Anesthesiology

## 2012-06-28 ENCOUNTER — Encounter (HOSPITAL_BASED_OUTPATIENT_CLINIC_OR_DEPARTMENT_OTHER): Payer: Self-pay | Admitting: Anesthesiology

## 2012-06-28 ENCOUNTER — Ambulatory Visit (HOSPITAL_BASED_OUTPATIENT_CLINIC_OR_DEPARTMENT_OTHER)
Admission: RE | Admit: 2012-06-28 | Discharge: 2012-06-28 | Disposition: A | Discharge status: Home / Self Care | Payer: Medicare Other | Source: Ambulatory Visit | Attending: Plastic Surgery | Admitting: Plastic Surgery

## 2012-06-28 ENCOUNTER — Encounter (HOSPITAL_BASED_OUTPATIENT_CLINIC_OR_DEPARTMENT_OTHER): Payer: Self-pay

## 2012-06-28 ENCOUNTER — Encounter (HOSPITAL_BASED_OUTPATIENT_CLINIC_OR_DEPARTMENT_OTHER)
Admission: RE | Disposition: A | Discharge status: Home / Self Care | Payer: Self-pay | Source: Ambulatory Visit | Attending: Plastic Surgery

## 2012-06-28 DIAGNOSIS — I252 Old myocardial infarction: Secondary | ICD-10-CM | POA: Insufficient documentation

## 2012-06-28 DIAGNOSIS — Z79899 Other long term (current) drug therapy: Secondary | ICD-10-CM | POA: Insufficient documentation

## 2012-06-28 DIAGNOSIS — F411 Generalized anxiety disorder: Secondary | ICD-10-CM | POA: Insufficient documentation

## 2012-06-28 DIAGNOSIS — Z87891 Personal history of nicotine dependence: Secondary | ICD-10-CM | POA: Insufficient documentation

## 2012-06-28 DIAGNOSIS — L98499 Non-pressure chronic ulcer of skin of other sites with unspecified severity: Secondary | ICD-10-CM | POA: Insufficient documentation

## 2012-06-28 DIAGNOSIS — Z7982 Long term (current) use of aspirin: Secondary | ICD-10-CM | POA: Insufficient documentation

## 2012-06-28 DIAGNOSIS — I2510 Atherosclerotic heart disease of native coronary artery without angina pectoris: Secondary | ICD-10-CM | POA: Insufficient documentation

## 2012-06-28 DIAGNOSIS — L98493 Non-pressure chronic ulcer of skin of other sites with necrosis of muscle: Secondary | ICD-10-CM

## 2012-06-28 DIAGNOSIS — E785 Hyperlipidemia, unspecified: Secondary | ICD-10-CM | POA: Insufficient documentation

## 2012-06-28 HISTORY — PX: INCISION AND DRAINAGE OF WOUND: SHX1803

## 2012-06-28 LAB — POCT I-STAT 4, (NA,K, GLUC, HGB,HCT)
Glucose, Bld: 90 mg/dL (ref 70–99)
HCT: 36 % (ref 36.0–46.0)
Hemoglobin: 12.2 g/dL (ref 12.0–15.0)
Potassium: 3.5 mEq/L (ref 3.5–5.1)

## 2012-06-28 SURGERY — IRRIGATION AND DEBRIDEMENT WOUND
Anesthesia: General

## 2012-06-28 MED ORDER — ONDANSETRON HCL 4 MG/2ML IJ SOLN
INTRAMUSCULAR | Status: DC | PRN
  Administered 2012-06-28: 4 mg via INTRAVENOUS

## 2012-06-28 MED ORDER — MIDAZOLAM HCL 5 MG/5ML IJ SOLN
INTRAMUSCULAR | Status: DC | PRN
  Administered 2012-06-28 (×2): 1 mg via INTRAVENOUS

## 2012-06-28 MED ORDER — PROMETHAZINE HCL 25 MG/ML IJ SOLN
6.2500 mg | INTRAMUSCULAR | Status: DC | PRN
Start: 2012-06-28 — End: 2012-06-28
  Filled 2012-06-28: qty 1

## 2012-06-28 MED ORDER — KETOROLAC TROMETHAMINE 30 MG/ML IJ SOLN
INTRAMUSCULAR | Status: DC | PRN
  Administered 2012-06-28: 30 mg via INTRAVENOUS

## 2012-06-28 MED ORDER — PROPOFOL 10 MG/ML IV BOLUS
INTRAVENOUS | Status: DC | PRN
  Administered 2012-06-28: 200 mg via INTRAVENOUS

## 2012-06-28 MED ORDER — FENTANYL CITRATE 0.05 MG/ML IJ SOLN
INTRAMUSCULAR | Status: DC | PRN
  Administered 2012-06-28 (×3): 25 ug via INTRAVENOUS
  Administered 2012-06-28 (×2): 50 ug via INTRAVENOUS
  Administered 2012-06-28: 25 ug via INTRAVENOUS

## 2012-06-28 MED ORDER — LIDOCAINE HCL (CARDIAC) 20 MG/ML IV SOLN
INTRAVENOUS | Status: DC | PRN
  Administered 2012-06-28: 100 mg via INTRAVENOUS

## 2012-06-28 MED ORDER — EPHEDRINE SULFATE 50 MG/ML IJ SOLN
INTRAMUSCULAR | Status: DC | PRN
  Administered 2012-06-28 (×2): 10 mg via INTRAVENOUS

## 2012-06-28 MED ORDER — CEFAZOLIN SODIUM-DEXTROSE 2-3 GM-% IV SOLR
2.0000 g | INTRAVENOUS | Status: DC
  Filled 2012-06-28: qty 50

## 2012-06-28 MED ORDER — LACTATED RINGERS IV SOLN
INTRAVENOUS | Status: DC | PRN
  Administered 2012-06-28 (×2): via INTRAVENOUS

## 2012-06-28 MED ORDER — LACTATED RINGERS IV SOLN
INTRAVENOUS | Status: DC
  Administered 2012-06-28: 08:00:00 via INTRAVENOUS
  Filled 2012-06-28: qty 1000

## 2012-06-28 MED ORDER — CEFAZOLIN SODIUM-DEXTROSE 2-3 GM-% IV SOLR
INTRAVENOUS | Status: DC | PRN
  Administered 2012-06-28: 2 g via INTRAVENOUS

## 2012-06-28 MED ORDER — SODIUM CHLORIDE 0.9 % IR SOLN
Status: DC | PRN
  Administered 2012-06-28: 09:00:00

## 2012-06-28 MED ORDER — DEXAMETHASONE SODIUM PHOSPHATE 4 MG/ML IJ SOLN
INTRAMUSCULAR | Status: DC | PRN
  Administered 2012-06-28: 10 mg via INTRAVENOUS

## 2012-06-28 MED ORDER — FENTANYL CITRATE 0.05 MG/ML IJ SOLN
25.0000 ug | INTRAMUSCULAR | Status: DC | PRN
  Filled 2012-06-28: qty 1

## 2012-06-28 SURGICAL SUPPLY — 89 items
BAG DECANTER FOR FLEXI CONT (MISCELLANEOUS) IMPLANT
BANDAGE ELASTIC 3 VELCRO ST LF (GAUZE/BANDAGES/DRESSINGS) IMPLANT
BANDAGE ELASTIC 4 VELCRO ST LF (GAUZE/BANDAGES/DRESSINGS) IMPLANT
BANDAGE ELASTIC 6 VELCRO ST LF (GAUZE/BANDAGES/DRESSINGS) IMPLANT
BANDAGE GAUZE ELAST BULKY 4 IN (GAUZE/BANDAGES/DRESSINGS) IMPLANT
BENZOIN TINCTURE PRP APPL 2/3 (GAUZE/BANDAGES/DRESSINGS) IMPLANT
BLADE MINI RND TIP GREEN BEAV (BLADE) IMPLANT
BLADE SURG 10 STRL SS (BLADE) IMPLANT
BLADE SURG 15 STRL LF DISP TIS (BLADE) ×1 IMPLANT
BLADE SURG 15 STRL SS (BLADE) ×1
BNDG COHESIVE 1X5 TAN STRL LF (GAUZE/BANDAGES/DRESSINGS) IMPLANT
BNDG COHESIVE 4X5 TAN NS LF (GAUZE/BANDAGES/DRESSINGS) ×2 IMPLANT
BNDG ESMARK 4X9 LF (GAUZE/BANDAGES/DRESSINGS) IMPLANT
CANISTER OMNI JUG 16 LITER (MISCELLANEOUS) IMPLANT
CANISTER SUCTION 1200CC (MISCELLANEOUS) IMPLANT
CANISTER SUCTION 2500CC (MISCELLANEOUS) IMPLANT
CHLORAPREP W/TINT 26ML (MISCELLANEOUS) IMPLANT
CLOTH BEACON ORANGE TIMEOUT ST (SAFETY) ×2 IMPLANT
CORDS BIPOLAR (ELECTRODE) IMPLANT
COVER MAYO STAND STRL (DRAPES) ×2 IMPLANT
COVER TABLE BACK 60X90 (DRAPES) ×2 IMPLANT
DECANTER SPIKE VIAL GLASS SM (MISCELLANEOUS) IMPLANT
DRAIN PENROSE 18X1/2 LTX STRL (DRAIN) ×2 IMPLANT
DRAPE EXTREMITY T 121X128X90 (DRAPE) IMPLANT
DRAPE INCISE IOBAN 66X45 STRL (DRAPES) IMPLANT
DRAPE LG THREE QUARTER DISP (DRAPES) IMPLANT
DRSG EMULSION OIL 3X3 NADH (GAUZE/BANDAGES/DRESSINGS) IMPLANT
DRSG PAD ABDOMINAL 8X10 ST (GAUZE/BANDAGES/DRESSINGS) IMPLANT
ELECT NEEDLE TIP 2.8 STRL (NEEDLE) IMPLANT
ELECT REM PT RETURN 9FT ADLT (ELECTROSURGICAL) ×2
ELECTRODE REM PT RTRN 9FT ADLT (ELECTROSURGICAL) ×1 IMPLANT
GAUZE SPONGE 4X4 12PLY STRL LF (GAUZE/BANDAGES/DRESSINGS) IMPLANT
GAUZE XEROFORM 1X8 LF (GAUZE/BANDAGES/DRESSINGS) IMPLANT
GAUZE XEROFORM 5X9 LF (GAUZE/BANDAGES/DRESSINGS) IMPLANT
GLOVE BIO SURGEON STRL SZ 6.5 (GLOVE) ×4 IMPLANT
GLOVE BIO SURGEON STRL SZ7 (GLOVE) ×2 IMPLANT
GLOVE SKINSENSE NS SZ6.5 (GLOVE) ×1
GLOVE SKINSENSE STRL SZ6.5 (GLOVE) ×1 IMPLANT
GOWN PREVENTION PLUS XLARGE (GOWN DISPOSABLE) ×2 IMPLANT
HANDPIECE INTERPULSE COAX TIP (DISPOSABLE)
IV NS IRRIG 3000ML ARTHROMATIC (IV SOLUTION) IMPLANT
MATRIX SURGICAL PSMX 7X10CM (Tissue) ×2 IMPLANT
MICROMATRIX 500MG (Tissue) ×4 IMPLANT
MONTGOMMERY ×2 IMPLANT
NEEDLE 27GAX1X1/2 (NEEDLE) IMPLANT
NEEDLE HYPO 30GX1 BEV (NEEDLE) IMPLANT
NS IRRIG 1000ML POUR BTL (IV SOLUTION) ×2 IMPLANT
PACK BASIN DAY SURGERY FS (CUSTOM PROCEDURE TRAY) ×2 IMPLANT
PADDING CAST ABS 3INX4YD NS (CAST SUPPLIES)
PADDING CAST ABS 4INX4YD NS (CAST SUPPLIES)
PADDING CAST ABS COTTON 3X4 (CAST SUPPLIES) IMPLANT
PADDING CAST ABS COTTON 4X4 ST (CAST SUPPLIES) IMPLANT
PENCIL BUTTON HOLSTER BLD 10FT (ELECTRODE) IMPLANT
SET HNDPC FAN SPRY TIP SCT (DISPOSABLE) IMPLANT
SLEEVE SCD COMPRESS KNEE MED (MISCELLANEOUS) IMPLANT
SOLUTION PARTIC MCRMTRX 500MG (Tissue) ×2 IMPLANT
SPLINT PLASTER CAST XFAST 3X15 (CAST SUPPLIES) IMPLANT
SPLINT PLASTER XTRA FASTSET 3X (CAST SUPPLIES)
SPONGE GAUZE 4X4 12PLY (GAUZE/BANDAGES/DRESSINGS) ×2 IMPLANT
SPONGE LAP 18X18 X RAY DECT (DISPOSABLE) IMPLANT
SPONGE LAP 4X18 X RAY DECT (DISPOSABLE) IMPLANT
STAPLER VISISTAT 35W (STAPLE) IMPLANT
STOCKINETTE 4X48 STRL (DRAPES) IMPLANT
STOCKINETTE 6  STRL (DRAPES) ×1
STOCKINETTE 6 STRL (DRAPES) ×1 IMPLANT
STOCKINETTE IMPERVIOUS LG (DRAPES) IMPLANT
STRIP CLOSURE SKIN 1/2X4 (GAUZE/BANDAGES/DRESSINGS) IMPLANT
SUCTION FRAZIER TIP 10 FR DISP (SUCTIONS) IMPLANT
SURGILUBE 2OZ TUBE FLIPTOP (MISCELLANEOUS) IMPLANT
SUT ETHILON 3 0 PS 1 (SUTURE) IMPLANT
SUT ETHILON 4 0 P 3 18 (SUTURE) IMPLANT
SUT ETHILON 5 0 PS 2 18 (SUTURE) IMPLANT
SUT PROLENE 3 0 PS 2 (SUTURE) IMPLANT
SUT SILK 3 0 PS 1 (SUTURE) IMPLANT
SUT VIC AB 3-0 FS2 27 (SUTURE) IMPLANT
SUT VIC AB 5-0 P-3 18X BRD (SUTURE) IMPLANT
SUT VIC AB 5-0 P3 18 (SUTURE)
SUT VIC AB 5-0 PS2 18 (SUTURE) IMPLANT
SYR BULB IRRIGATION 50ML (SYRINGE) IMPLANT
SYR CONTROL 10ML LL (SYRINGE) ×2 IMPLANT
TAPE HYPAFIX 6X30 (GAUZE/BANDAGES/DRESSINGS) IMPLANT
TIP FLEX 45CM EVICEL (HEMOSTASIS) IMPLANT
TIP RIGID 35CM EVICEL (HEMOSTASIS) IMPLANT
TOWEL OR 17X24 6PK STRL BLUE (TOWEL DISPOSABLE) ×2 IMPLANT
TRAY DSU PREP LF (CUSTOM PROCEDURE TRAY) IMPLANT
TUBE CONNECTING 12X1/4 (SUCTIONS) IMPLANT
UNDERPAD 30X30 INCONTINENT (UNDERPADS AND DIAPERS) ×2 IMPLANT
WATER STERILE IRR 1000ML POUR (IV SOLUTION) ×2 IMPLANT
YANKAUER SUCT BULB TIP NO VENT (SUCTIONS) IMPLANT

## 2012-06-28 NOTE — Anesthesia Procedure Notes (Signed)
Procedure Name: LMA Insertion Date/Time: 06/28/2012 8:40 AM Performed by: Jessica Priest Pre-anesthesia Checklist: Patient identified, Emergency Drugs available, Suction available and Patient being monitored Patient Re-evaluated:Patient Re-evaluated prior to inductionOxygen Delivery Method: Circle System Utilized Preoxygenation: Pre-oxygenation with 100% oxygen Intubation Type: IV induction Ventilation: Mask ventilation without difficulty LMA: LMA with gastric port inserted LMA Size: 4.0 Number of attempts: 1 Placement Confirmation: positive ETCO2 Tube secured with: Tape Dental Injury: Teeth and Oropharynx as per pre-operative assessment

## 2012-06-28 NOTE — Anesthesia Postprocedure Evaluation (Signed)
  Anesthesia Post-op Note  Patient: Julia West  Procedure(s) Performed: Procedure(s) (LRB): IRRIGATION AND DEBRIDEMENT OF ABDOMINAL ULCER SURGICAL PREP AND PLACEMENT OF ACELL AND VAC (N/A)  Patient Location: PACU  Anesthesia Type: General  Level of Consciousness: awake and alert   Airway and Oxygen Therapy: Patient Spontanous Breathing  Post-op Pain: mild  Post-op Assessment: Post-op Vital signs reviewed, Patient's Cardiovascular Status Stable, Respiratory Function Stable, Patent Airway and No signs of Nausea or vomiting  Last Vitals:  Filed Vitals:   06/28/12 0925  BP: 108/45  Pulse: 47  Temp: 37.2 C  Resp: 8    Post-op Vital Signs: stable   Complications: No apparent anesthesia complications

## 2012-06-28 NOTE — H&P (Signed)
Julia West is an 57 y.o. female.   Chief Complaint: abdominal wall ulcer HPI: The patient is a 57 yrs old wf here for treatment of her chronic abdominal wall ulcer.  She has undergone serial debridement with placement of Acell and the VAC.  She has done very well and the wound is getting significantly smaller.  There infection has resolved and now it is a matter of healing.  Past Medical History  Diagnosis Date  . Morbid obesity   . Other and unspecified hyperlipidemia   . Unspecified essential hypertension   . Acute myocardial infarction     with a ruptured plaque in the circumflex in 2003  . Degenerative joint disease   . Internal and external hemorrhoids without complication   . Anxiety   . Coronary artery disease   . Complication of anesthesia     states low O2 sats post-op 11/13  . Surgical wound, non healing ABDOMINAL     has wound vac @ 125 mm Hg    Past Surgical History  Procedure Laterality Date  . Cesarean section      x 4 in remote past  . Cholecystectomy    . Coronary artery bypass graft  2003    by Dr. Tyrone Sage. LIMA to the LAD, free RIMA to the circumflex. Stress perfusion study December 2009 with no high-risk areas of ischemia. She has a well-preserved ejection fraction  . Incisional hernia repair  02/07/2012    Procedure: HERNIA REPAIR INCISIONAL;  Surgeon: [name], MD,FACS;  Location: [facility] OR;  Service: General;;  Open, Primary repair, strangulated Incisional hernia.  . Bowel resection  02/07/2012    Procedure: SMALL BOWEL RESECTION;  Surgeon: [name], MD,FACS;  Location: [facility] OR;  Service: General;;  . Application of wound vac    . Incision and drainage of wound  04/17/2012    Procedure: IRRIGATION AND DEBRIDEMENT WOUND;  Surgeon: Wayland Denis, DO;  Location: [facility] [place];  Service: Plastics;  Laterality: N/A;  OF ABDOMINAL WOUND, SURGICAL PREP AND PLACEMENT OF VAC, REMOVAL FOREHEAD SKIN LESION  . Lesion removal  04/17/2012     Procedure: LESION REMOVAL;  Surgeon: Wayland Denis, DO;  Location: [facility] [place];  Service: Plastics;  Laterality: N/A;   CENTER OF FOREHEAD, REMOVAL FORHEAD SKIN LESION  . Incision and drainage of wound N/A 04/24/2012    Procedure: IRRIGATION AND DEBRIDEMENT WOUND;  Surgeon: Wayland Denis, DO;  Location: [facility] [place];  Service: Plastics;  Laterality: N/A;  I & D ABDOMINAL WOUND WITH VAC AND ACELL  . Incision and drainage of wound N/A 05/01/2012    Procedure: IRRIGATION AND DEBRIDEMENT WOUND;  Surgeon: Wayland Denis, DO;  Location: [facility];  Service: Plastics;  Laterality: N/A;  WITH SURGICAL PREP AND PLACEMENT OF VAC  . Incision and drainage of wound N/A 05/08/2012    Procedure: IRRIGATION AND DEBRIDEMENT OF ABD WOUND SURGICAL PREP AND PLACEMENT OF VAC ;  Surgeon: Wayland Denis, DO;  Location: [facility];  Service: Plastics;  Laterality: N/A;  IRRIGATION AND DEBRIDEMENT OF ABD WOUND SURGICAL PREP AND PLACEMENT OF VAC   . Cardiovascular stress test  01-17-2012  DR HOCHREIN    LOW RISK NUCLEAR TEST  . Incision and drainage of wound N/A 05/15/2012    Procedure: IRRIGATION AND DEBRIDEMENT OF ABDOMINAL WOUND WITH POSSIBLE SURGICAL PREP AND PLACEMENT OF VAC;  Surgeon: Wayland Denis, DO;  Location: [facility];  Service: Plastics;  Laterality:  N/A;  . Incision and drainage of wound N/A 05/22/2012    Procedure: IRRIGATION AND DEBRIDEMENT OF ABODOMINAL WOUND WITH  SURGICAL PREP AND VAC PLACEMENT;  Surgeon: Wayland Denis, DO;  Location: [facility];  Service: Plastics;  Laterality: N/A;    Family History  Problem Relation Age of Onset  . Heart attack Mother   . Hypertension Sister    Social History:  reports that she quit smoking about 7 months ago. Her smoking use included Cigarettes. She has a 25 pack-year smoking history. She has never used smokeless tobacco. She reports that she does not drink alcohol or use illicit  drugs.  Allergies:  Allergies  Allergen Reactions  . Ace Inhibitors Cough    Medications Prior to Admission  Medication Sig Dispense Refill  . ALPRAZolam (XANAX) 0.25 MG tablet Take 1 tablet (0.25 mg total) by mouth 2 (two) times daily as needed for sleep.  60 tablet  0  . aspirin 81 MG tablet Take 81 mg by mouth daily.       . citalopram (CELEXA) 20 MG tablet Take 20 mg by mouth daily.      . Fe Fum-FePoly-Vit C-Vit B3 (INTEGRA PO) Take by mouth.      . ferrous sulfate 325 (65 FE) MG tablet Take 325 mg by mouth daily with breakfast.      . fish oil-omega-3 fatty acids 1000 MG capsule Take 2 g by mouth daily.        . furosemide (LASIX) 20 MG tablet Take 10 mg by mouth daily.        Marland Kitchen HYDROcodone-acetaminophen (NORCO/VICODIN) 5-325 MG per tablet Take 1 tablet by mouth every 6 (six) hours as needed for pain.  40 tablet  0  . ibuprofen (ADVIL,MOTRIN) 200 MG tablet Take 600 mg by mouth daily. Pain      . metoprolol (LOPRESSOR) 50 MG tablet Take 50 mg by mouth 2 (two) times daily.        . penicillin v potassium (VEETID) 500 MG tablet Take 500 mg by mouth 2 (two) times daily.      . rosuvastatin (CRESTOR) 10 MG tablet Take 10 mg by mouth at bedtime.       . valsartan-hydrochlorothiazide (DIOVAN-HCT) 160-12.5 MG per tablet Take 1 tablet by mouth daily.      . vitamin C (ASCORBIC ACID) 500 MG tablet Take 500 mg by mouth daily.      Marland Kitchen zinc gluconate 50 MG tablet Take 50 mg by mouth daily.      Marland Kitchen zolpidem (AMBIEN) 10 MG tablet Take 1 tablet (10 mg total) by mouth at bedtime. For sleep  30 tablet  1    Results for orders placed during the hospital encounter of 06/28/12 (from the past 48 hour(s))  POCT I-STAT 4, (NA,K, GLUC, HGB,HCT)     Status: None   Collection Time    06/28/12  7:54 AM      Result Value Range   Sodium 142  135 - 145 mEq/L   Potassium 3.5  3.5 - 5.1 mEq/L   Glucose, Bld 90  70 - 99 mg/dL   HCT 16.1  09.6 - 04.5 %   Hemoglobin 12.2  12.0 - 15.0 g/dL   No results  found.  Review of Systems  Constitutional: Negative.   HENT: Negative.   Eyes: Negative.   Respiratory: Negative.   Cardiovascular: Negative.   Gastrointestinal: Positive for abdominal pain. Negative for heartburn, nausea and constipation.  Genitourinary: Negative.  Musculoskeletal: Negative.   Skin: Negative.   Psychiatric/Behavioral: Negative.     Blood pressure 139/69, pulse 47, temperature 97 F (36.1 C), temperature source Oral, resp. rate 18, height 5\' 7"  (1.702 m), weight 130.636 kg (288 lb), SpO2 94.00%. Physical Exam  Constitutional: She appears well-developed and well-nourished.  HENT:  Head: Normocephalic and atraumatic.  Eyes: Conjunctivae and EOM are normal. Pupils are equal, round, and reactive to light.  Cardiovascular: Normal rate.   Respiratory: Effort normal.  GI: Soft. She exhibits distension. She exhibits no mass. There is tenderness. There is guarding. There is no rebound.  Neurological: She is alert.  Skin: Skin is warm.  Psychiatric: She has a normal mood and affect. Her behavior is normal.     Assessment/Plan Abdominal ulcer - plan for more irrigation and debridement with Acell and Vac placement. Risks and complications were reviewed and the consent was signed.  SANGER,CLAIRE 06/28/2012, 8:27 AM

## 2012-06-28 NOTE — Transfer of Care (Signed)
Immediate Anesthesia Transfer of Care Note  Patient: Julia West  Procedure(s) Performed: Procedure(s) (LRB): IRRIGATION AND DEBRIDEMENT OF ABDOMINAL ULCER SURGICAL PREP AND PLACEMENT OF ACELL AND VAC (N/A)  Patient Location: PACU  Anesthesia Type: General  Level of Consciousness: awake, sedated, patient cooperative and responds to stimulation  Airway & Oxygen Therapy: Patient Spontanous Breathing and Patient connected to face mask oxygen  Post-op Assessment: Report given to PACU RN, Post -op Vital signs reviewed and stable and Patient moving all extremities  Post vital signs: Reviewed and stable  Complications: No apparent anesthesia complications

## 2012-06-28 NOTE — Op Note (Signed)
OPERATIVE REPORT  LOCATION: [facility] FACILITY: [facility]   DATE OF PROCEDURE: 05/22/2012   PREOPERATIVE DIAGNOSIS: Large abdominal wall ulcer.   POSTOPERATIVE DIAGNOSIS: Large abdominal wall ulcer.   PROCEDURE: Placement of Acell and the VAC on abdominal wall ulcer. ACell 1 g powder and a sheet (5 x 5 x 2 cm)    ATTENDING SURGEON: Claire Sanger, DO  ANESTHESIA: General.   INDICATION FOR PROCEDURE: The patient is a 57 year old female with an extremely large abdominal ulcer. She presents for debridement. She has undergone debridements in the past and presents for further surgical care. Risks and complications were reviewed.   DESCRIPTION OF PROCEDURE: The patient was taken to the operating room, placed on the operating room table in the supine position. General anesthesia was administered. Once adequate, a time-out was called. All information was confirmed to be correct. She was prepped and draped in the usual sterile fashion. The pocket was irrigated with antibiotic solution and normal saline. There is a great improvement in the tissue. There was partial incorporation of the Acell sheet. ACell powder 1 g and a 5 x 9 sheet was placed in the area. Adaptic was applied with Hydrogel and A VAC was applied to 125 mmHg pressure. The patient did tolerate the procedure well. There were no complications. She was allowed to wake up, extubated, and taken to recovery in stable condition.

## 2012-06-28 NOTE — Anesthesia Preprocedure Evaluation (Addendum)
Anesthesia Evaluation  Patient identified by MRN, date of birth, ID band Patient awake  General Assessment Comment:Has had multiple procedures recently. She often has postoperative relative low oxygen saturations from years of smoking.  Reviewed: Allergy & Precautions, H&P , NPO status , Patient's Chart, lab work & pertinent test results  Airway Mallampati: II TM Distance: >3 FB Neck ROM: Full    Dental  (+) Edentulous Lower and Edentulous Upper   Pulmonary neg pulmonary ROS,  breath sounds clear to auscultation  Pulmonary exam normal       Cardiovascular hypertension, Pt. on medications and Pt. on home beta blockers + CAD and + Past MI Rhythm:Regular Rate:Normal     Neuro/Psych PSYCHIATRIC DISORDERS Anxiety negative neurological ROS     GI/Hepatic negative GI ROS, Neg liver ROS,   Endo/Other  Morbid obesity  Renal/GU negative Renal ROS  negative genitourinary   Musculoskeletal negative musculoskeletal ROS (+)   Abdominal (+) + obese,   Peds negative pediatric ROS (+)  Hematology negative hematology ROS (+)   Anesthesia Other Findings   Reproductive/Obstetrics negative OB ROS                          Anesthesia Physical Anesthesia Plan  ASA: III  Anesthesia Plan: General   Post-op Pain Management:    Induction: Intravenous  Airway Management Planned: LMA  Additional Equipment:   Intra-op Plan:   Post-operative Plan: Extubation in OR  Informed Consent: I have reviewed the patients History and Physical, chart, labs and discussed the procedure including the risks, benefits and alternatives for the proposed anesthesia with the patient or authorized representative who has indicated his/her understanding and acceptance.   Dental advisory given  Plan Discussed with: CRNA  Anesthesia Plan Comments:         Anesthesia Quick Evaluation

## 2012-06-28 NOTE — Brief Op Note (Signed)
06/28/2012  9:09 AM  PATIENT:  Julia West  57 y.o. female  PRE-OPERATIVE DIAGNOSIS:  abdominal ulcer  POST-OPERATIVE DIAGNOSIS:  abdominal ulcer  PROCEDURE:  Procedure(s): IRRIGATION AND DEBRIDEMENT OF ABDOMINAL ULCER SURGICAL PREP AND PLACEMENT OF ACELL AND VAC (N/A)  SURGEON:  Surgeon(s) and Role:    * Claire Sanger, DO - Primary  PHYSICIAN ASSISTANT: none  ASSISTANTS: Harrie Foreman, LPN   ANESTHESIA:   general  EBL:  Total I/O In: 1000 [I.V.:1000] Out: -   BLOOD ADMINISTERED:none  DRAINS: none   LOCAL MEDICATIONS USED:  NONE  SPECIMEN:  No Specimen  DISPOSITION OF SPECIMEN:  N/A  COUNTS:  YES  TOURNIQUET:  * No tourniquets in log *  DICTATION: .Dragon Dictation  PLAN OF CARE: Discharge to home after PACU  PATIENT DISPOSITION:  PACU - hemodynamically stable.   Delay start of Pharmacological VTE agent (>24hrs) due to surgical blood loss or risk of bleeding: no

## 2012-06-30 NOTE — Op Note (Signed)
OPERATIVE REPORT  LOCATION: [facility] FACILITY: [facility]   DATE OF PROCEDURE: 06/28/2012   PREOPERATIVE DIAGNOSIS: Large abdominal wall ulcer.   POSTOPERATIVE DIAGNOSIS: Large abdominal wall ulcer.   PROCEDURE: Placement of Acell and the VAC on abdominal wall ulcer. ACell 1 g powder and a sheet (5 x 5 x 2 cm)    ATTENDING SURGEON: Claire Sanger, DO  ANESTHESIA: General.   INDICATION FOR PROCEDURE: The patient is a 57 year old female with an extremely large abdominal ulcer. She presents for debridement. She has undergone debridements in the past and presents for further surgical care. Risks and complications were reviewed.   DESCRIPTION OF PROCEDURE: The patient was taken to the operating room, placed on the operating room table in the supine position. General anesthesia was administered. Once adequate, a time-out was called. All information was confirmed to be correct. She was prepped and draped in the usual sterile fashion. The pocket was irrigated with antibiotic solution and normal saline. There is a great improvement in the tissue. There was partial incorporation of the Acell sheet. ACell powder 1 g and a 5 x 9 sheet was placed in the area. Adaptic was applied with Hydrogel and A VAC was applied to 125 mmHg pressure. The patient did tolerate the procedure well. There were no complications. She was allowed to wake up, extubated, and taken to recovery in stable condition.

## 2012-07-03 ENCOUNTER — Encounter (HOSPITAL_BASED_OUTPATIENT_CLINIC_OR_DEPARTMENT_OTHER): Payer: Self-pay | Admitting: Plastic Surgery

## 2012-07-03 NOTE — Progress Notes (Signed)
[facility]  NAME:  Julia West, Julia West NO.:  [id number]  MEDICAL RECORD NO.:  [id number]      DATE OF BIRTH:  1955/09/07  PHYSICIAN:  Wayland Denis, DO       VISIT DATE:  07/03/2012                                  OFFICE VISIT   The patient is a 57 year old female who is here for followup on her abdominal ulcer.  She is doing extremely well, healing in.  No sign of infection.  The ACell is partially incorporated.  The Adaptic was changed.  VAC was reapplied with K-Y jelly and we will see her back in a week.     Wayland Denis, DO     CS/MEDQ  D:  07/03/2012  T:  07/03/2012  Job:  [id number]

## 2012-07-17 ENCOUNTER — Encounter (HOSPITAL_BASED_OUTPATIENT_CLINIC_OR_DEPARTMENT_OTHER): Payer: Medicare Other | Attending: Plastic Surgery

## 2012-07-17 DIAGNOSIS — L98499 Non-pressure chronic ulcer of skin of other sites with unspecified severity: Secondary | ICD-10-CM | POA: Insufficient documentation

## 2012-07-18 NOTE — Progress Notes (Signed)
[facility]  NAME:  Julia West, Julia West           ACCOUNT NO.:  [id number]  MEDICAL RECORD NO.:  [id number]      DATE OF BIRTH:  November 28, 1955  PHYSICIAN:  Wayland Denis, DO       VISIT DATE:  07/17/2012                                  OFFICE VISIT   The patient is a 57 year old female who is here for followup on her abdominal ulcer, and has gotten remarkably better and decrease in depth and width and height.  He has been using the VAC.  We will continue with that and change it in a week.     Wayland Denis, DO     CS/MEDQ  D:  07/17/2012  T:  07/18/2012  Job:  [id number]

## 2012-07-25 NOTE — Progress Notes (Signed)
[facility]  NAME:  Julia West, Julia West                ACCOUNT NO.:  MEDICAL RECORD NO.:  [id number]      DATE OF BIRTH:  03/28/1955  PHYSICIAN:  Wayland Denis, DO            VISIT DATE:                                  OFFICE VISIT   The patient is a 57 year old female, who is here for followup on her chronic abdominal ulcer.  She is doing extremely well.  Using the [facility], she has a little bit of VAC that was stuck there, that was pulled out, and there is a periwound irritation that is pretty significant.  So, we are going to hold the [facility] for a week and use Silvercel gel with wet-to- dry dressings daily, and we will see her back in a week.  She can shower daily as well.     Wayland Denis, DO     CS/MEDQ  D:  07/24/2012  T:  07/25/2012  Job:  [id number]

## 2012-08-01 NOTE — Progress Notes (Signed)
[facility]  NAME:  Julia West, Julia West           ACCOUNT NO.:  [id number]  MEDICAL RECORD NO.:  [id number]      DATE OF BIRTH:  [birth year]  PHYSICIAN:  Wayland Denis, DO       VISIT DATE:  07/31/2012                                  OFFICE VISIT   The patient is a 57 year old female who is here for followup of her huge abdominal ulcer.  She is doing incredibly well.  She has been off the [facility] for another week.  Her skin looks much better.  We will do Endoform for the next 2 weeks and see her back in a week and send the VAC back at this point.     Wayland Denis, DO     CS/MEDQ  D:  07/31/2012  T:  08/01/2012  Job:  [id number]

## 2012-08-14 ENCOUNTER — Encounter (HOSPITAL_BASED_OUTPATIENT_CLINIC_OR_DEPARTMENT_OTHER): Payer: Medicare Other | Attending: Plastic Surgery

## 2012-08-14 DIAGNOSIS — L98499 Non-pressure chronic ulcer of skin of other sites with unspecified severity: Secondary | ICD-10-CM | POA: Insufficient documentation

## 2012-08-15 NOTE — Progress Notes (Signed)
[facility]  NAME:  Julia West, Julia West NO.:  [id number]  MEDICAL RECORD NO.:  [id number]      DATE OF BIRTH:  [birth year]  PHYSICIAN:  Wayland Denis, DO       VISIT DATE:  08/14/2012                                  OFFICE VISIT   The patient is a 57 year old female who is here for followup after serial debridement of her abdominal ulceration that was chronic.  She has done extremely well.  The wound has improved remarkably in its overall size and depth.  There is no undermining.  The tissue is red, clean, and granulating.  We will continue with Endoform and then wet-to- dry dressings and we will see her back in a week.     Wayland Denis, DO     CS/MEDQ  D:  08/14/2012  T:  08/15/2012  Job:  [id number]

## 2012-08-29 NOTE — Progress Notes (Signed)
[facility]  NAME:  Julia West, Julia West                ACCOUNT NO.:  MEDICAL RECORD NO.:  [id number]      DATE OF BIRTH:  Jan 26, 1956  PHYSICIAN:  Wayland Denis, DO       VISIT DATE:  08/28/2012                                  OFFICE VISIT   The patient is a 57 year old female who is here for followup on her abdominal ulcer.  She is doing extremely well.  The area is healing. There is no sign of infection.  It is granulating well and getting smaller.  We will continue with a Fibracol for this week, and see her back in 1 week.     Wayland Denis, DO     CS/MEDQ  D:  08/28/2012  T:  08/28/2012  Job:  [id number]

## 2012-09-04 ENCOUNTER — Other Ambulatory Visit: Payer: Self-pay | Admitting: Nurse Practitioner

## 2012-09-04 NOTE — Progress Notes (Signed)
[facility]  NAME:  Julia West, Julia West NO.:  [id number]  MEDICAL RECORD NO.:  [id number]      DATE OF BIRTH:  01-17-1956  PHYSICIAN:  Wayland Denis, DO       VISIT DATE:  09/04/2012                                  OFFICE VISIT   The patient is a 57 year old female here for followup on her abdominal ulcer.  She has done extremely well.  It is decreased in size as well as depth.  It is clean.  There is no sign of infection or fibrous tissue. We will continue with the collagen and silver, and see her back in a week.     Wayland Denis, DO     CS/MEDQ  D:  09/04/2012  T:  09/04/2012  Job:  [id number]

## 2012-09-06 NOTE — Telephone Encounter (Signed)
Last seen 05/24/12, last filled 06/08/12. Call into [facility] [phone]

## 2012-09-06 NOTE — Telephone Encounter (Signed)
Med called in

## 2012-09-06 NOTE — Telephone Encounter (Signed)
Please call in xanax rx with 2 refills 

## 2012-09-18 ENCOUNTER — Encounter (HOSPITAL_BASED_OUTPATIENT_CLINIC_OR_DEPARTMENT_OTHER): Payer: Medicare Other | Attending: Plastic Surgery

## 2012-09-18 DIAGNOSIS — L98499 Non-pressure chronic ulcer of skin of other sites with unspecified severity: Secondary | ICD-10-CM | POA: Insufficient documentation

## 2012-10-03 NOTE — Progress Notes (Signed)
[facility]  NAME:  MACEE, VENABLES NO.:  [id number]  MEDICAL RECORD NO.:  [id number]      DATE OF BIRTH:  [birth year]  PHYSICIAN:  Wayland Denis, DO            VISIT DATE:                                  OFFICE VISIT   The patient is a 57 year old female, who is here for followup on her abdominal ulcer.  She has been done extremely well and has healed it. She is extremely pleased with her progress.  She can go back to daily activities.  I would recommend she work her way up slowly and we will see her in my office in 1 month.     Wayland Denis, DO     CS/MEDQ  D:  10/02/2012  T:  10/03/2012  Job:  [id number]

## 2012-12-04 ENCOUNTER — Other Ambulatory Visit: Payer: Self-pay | Admitting: Nurse Practitioner

## 2012-12-06 ENCOUNTER — Telehealth: Payer: Self-pay | Admitting: *Deleted

## 2012-12-06 MED ORDER — ALPRAZOLAM 0.25 MG PO TABS: 0.2500 mg | ORAL_TABLET | Freq: Every evening | ORAL | Status: DC | PRN

## 2012-12-06 NOTE — Telephone Encounter (Signed)
Wants refill on Xanax. Has an upcoming appt. If approved please route to Pool B so nurse can called to [facility]

## 2012-12-06 NOTE — Telephone Encounter (Signed)
Script called in

## 2012-12-06 NOTE — Telephone Encounter (Signed)
Please call in xanax 0.25 1 PO QHS prn #[phone] 1 refill

## 2013-01-12 ENCOUNTER — Encounter: Payer: Self-pay | Admitting: Nurse Practitioner

## 2013-01-12 ENCOUNTER — Ambulatory Visit (INDEPENDENT_AMBULATORY_CARE_PROVIDER_SITE_OTHER): Payer: Medicare Other | Admitting: Nurse Practitioner

## 2013-01-12 ENCOUNTER — Other Ambulatory Visit: Payer: Self-pay | Admitting: Nurse Practitioner

## 2013-01-12 VITALS — BP 149/90 | HR 52 | Temp 97.7°F | Ht 67.0 in | Wt 324.0 lb

## 2013-01-12 DIAGNOSIS — Z0000 Encounter for general adult medical examination without abnormal findings: Secondary | ICD-10-CM

## 2013-01-12 DIAGNOSIS — Z23 Encounter for immunization: Secondary | ICD-10-CM

## 2013-01-12 DIAGNOSIS — Z01419 Encounter for gynecological examination (general) (routine) without abnormal findings: Secondary | ICD-10-CM

## 2013-01-12 DIAGNOSIS — I10 Essential (primary) hypertension: Secondary | ICD-10-CM

## 2013-01-12 DIAGNOSIS — E785 Hyperlipidemia, unspecified: Secondary | ICD-10-CM

## 2013-01-12 DIAGNOSIS — Z124 Encounter for screening for malignant neoplasm of cervix: Secondary | ICD-10-CM

## 2013-01-12 LAB — POCT CBC
HCT, POC: 44.6 % (ref 37.7–47.9)
Hemoglobin: 14.2 g/dL (ref 12.2–16.2)
MCH, POC: 25.9 pg — AB (ref 27–31.2)
MCV: 81.2 fL (ref 80–97)
RBC: 5.5 M/uL — AB (ref 4.04–5.48)

## 2013-01-12 MED ORDER — FUROSEMIDE 20 MG PO TABS: 10.0000 mg | ORAL_TABLET | Freq: Every day | ORAL | Status: DC

## 2013-01-12 MED ORDER — METOPROLOL TARTRATE 50 MG PO TABS: 50.0000 mg | ORAL_TABLET | Freq: Two times a day (BID) | ORAL | Status: DC

## 2013-01-12 MED ORDER — VALSARTAN-HYDROCHLOROTHIAZIDE 160-12.5 MG PO TABS: 1.0000 | ORAL_TABLET | Freq: Every day | ORAL | Status: DC

## 2013-01-12 MED ORDER — FERROUS SULFATE 325 (65 FE) MG PO TABS: 325.0000 mg | ORAL_TABLET | Freq: Every day | ORAL | Status: DC

## 2013-01-12 NOTE — Progress Notes (Signed)
Subjective:    Patient ID: Julia West, female    DOB: [birth year], 57 y.o.   MRN: [id number]  Hypertension This is a chronic problem. The current episode started more than 1 year ago. The problem has been waxing and waning since onset. The problem is controlled. Associated symptoms include anxiety and peripheral edema. Pertinent negatives include no blurred vision, chest pain, palpitations or shortness of breath. Risk factors for coronary artery disease include dyslipidemia, family history and post-menopausal state. Past treatments include diuretics and calcium channel blockers. The current treatment provides moderate improvement. Compliance problems include exercise.  There is no history of a thyroid problem.  Hyperlipidemia This is a chronic problem. The current episode started more than 1 year ago. The problem is uncontrolled. Recent lipid tests were reviewed and are high. Exacerbating diseases include obesity. She has no history of diabetes. Associated symptoms include leg pain and myalgias. Pertinent negatives include no chest pain or shortness of breath. Treatments tried: Pt was taking Crestor, but had joint pain and stopped four months ago. The current treatment provides no improvement of lipids. Compliance problems include medication side effects and medication cost.  Risk factors for coronary artery disease include post-menopausal, hypertension, obesity and dyslipidemia.  Anxiety Presents for follow-up visit. Symptoms include insomnia. Patient reports no chest pain, depressed mood, excessive worry, nervous/anxious behavior, palpitations, panic or shortness of breath. Symptoms occur occasionally. The quality of sleep is fair. Nighttime awakenings: occasional.   There is no history of anxiety/panic attacks, fibromyalgia or hyperthyroidism. Past treatments include benzodiazephines. The treatment provided significant relief. Compliance with prior treatments has been good.  Insomnia Pt use to  take ambien but never follow-up for refills-Has been taking xanax .25 at bedtime it works well. No complaints Peripheral Edema Pt takes lasix daily-Pt states it is working well-Edema decreased in lower extremities   *Pt has had 8 surgeries since Nov 2013- hernia repair and wound repair. Pt would like to discuss options for weight loss while she is here.   Review of Systems  Eyes: Negative for blurred vision.  Respiratory: Negative for shortness of breath.   Cardiovascular: Negative for chest pain and palpitations.  Musculoskeletal: Positive for myalgias.  Psychiatric/Behavioral: The patient has insomnia. The patient is not nervous/anxious.        Objective:   Physical Exam  Constitutional: She is oriented to person, place, and time. She appears well-developed and well-nourished.  HENT:  Head: Normocephalic.  Right Ear: Hearing, tympanic membrane, external ear and ear canal normal.  Left Ear: Hearing, tympanic membrane, external ear and ear canal normal.  Nose: Nose normal.  Mouth/Throat: Uvula is midline and oropharynx is clear and moist.  Eyes: Conjunctivae and EOM are normal. Pupils are equal, round, and reactive to light.  Neck: Normal range of motion and full passive range of motion without pain. Neck supple. No JVD present. Carotid bruit is not present. No mass and no thyromegaly present.  Cardiovascular: Normal rate, normal heart sounds and intact distal pulses.   No murmur heard. Pulmonary/Chest: Effort normal and breath sounds normal. Right breast exhibits no inverted nipple, no mass, no nipple discharge, no skin change and no tenderness. Left breast exhibits no inverted nipple, no mass, no nipple discharge, no skin change and no tenderness.  Abdominal: Soft. Bowel sounds are normal. She exhibits no mass. There is no tenderness.  Genitourinary: Vagina normal and uterus normal. No breast swelling, tenderness, discharge or bleeding.  bimanual exam-No adnexal masses or  tenderness. Cervix parous and pink-  no discharge  Musculoskeletal: Normal range of motion.  Lymphadenopathy:    She has no cervical adenopathy.  Neurological: She is alert and oriented to person, place, and time.  Skin: Skin is warm and dry.  Psychiatric: She has a normal mood and affect. Her behavior is normal. Judgment and thought content normal.    BP 149/90  Pulse 52  Temp(Src) 97.7 F (36.5 C) (Oral)  Ht 5\' 7"  (1.702 m)  Wt 324 lb (146.965 kg)  BMI 50.73 kg/m2       Assessment & Plan:   1. Encounter for routine gynecological examination   2. HYPERTENSION   3. DYSLIPIDEMIA   4. OBESITY, MORBID   5. Annual physical exam    Orders Placed This Encounter  Procedures  . CMP14+EGFR  . NMR, lipoprofile  . Thyroid Panel With TSH  . POCT CBC   Meds ordered this encounter  Medications  . metoprolol (LOPRESSOR) 50 MG tablet    Sig: Take 1 tablet (50 mg total) by mouth 2 (two) times daily.    Dispense:  180 tablet    Refill:  3    Order Specific Question:  Supervising Provider    Answer:  Ernestina Penna [[id number]]  . ferrous sulfate 325 (65 FE) MG tablet    Sig: Take 1 tablet (325 mg total) by mouth daily with breakfast.    Dispense:  90 tablet    Refill:  3    Order Specific Question:  Supervising Provider    Answer:  Ernestina Penna [[id number]]  . furosemide (LASIX) 20 MG tablet    Sig: Take 0.5 tablets (10 mg total) by mouth daily.    Dispense:  90 tablet    Refill:  3    Order Specific Question:  Supervising Provider    Answer:  Ernestina Penna [[id number]]  . valsartan-hydrochlorothiazide (DIOVAN-HCT) 160-12.5 MG per tablet    Sig: Take 1 tablet by mouth daily.    Dispense:  90 tablet    Refill:  3    Order Specific Question:  Supervising Provider    Answer:  Deborra Medina   Flu shot given today Continue all meds Labs pending Diet and exercise encouraged Health maintenance reviewed Follow up in 6 months  Mary-Margaret Daphine Deutscher, FNP

## 2013-01-12 NOTE — Patient Instructions (Signed)

## 2013-01-14 LAB — CMP14+EGFR
ALT: 11 IU/L (ref 0–32)
Albumin/Globulin Ratio: 1.4 (ref 1.1–2.5)
Albumin: 4.3 g/dL (ref 3.5–5.5)
GFR calc Af Amer: 78 mL/min/{1.73_m2} (ref >59)
GFR calc non Af Amer: 67 mL/min/{1.73_m2} (ref >59)
Glucose: 97 mg/dL (ref 65–99)
Potassium: 4.6 mmol/L (ref 3.5–5.2)
Total Bilirubin: 0.3 mg/dL (ref 0.0–1.2)
Total Protein: 7.3 g/dL (ref 6.0–8.5)

## 2013-01-14 LAB — NMR, LIPOPROFILE
Cholesterol: 211 mg/dL — ABNORMAL HIGH (ref <200)
HDL Particle Number: 29.3 umol/L — ABNORMAL LOW (ref >=30.5)
LDL Size: 20.7 nm (ref >20.5)
Small LDL Particle Number: 831 nmol/L — ABNORMAL HIGH (ref <=527)
Triglycerides by NMR: 203 mg/dL — ABNORMAL HIGH (ref <150)

## 2013-01-14 LAB — THYROID PANEL WITH TSH
Free Thyroxine Index: 1.9 (ref 1.2–4.9)
T3 Uptake Ratio: 27 % (ref 24–39)

## 2013-01-15 NOTE — Telephone Encounter (Signed)
Please call in ambien rx 

## 2013-01-15 NOTE — Telephone Encounter (Signed)
Patient seen by you in office on 01-12-13. According to that note looks like patient was taking xanax at bedtime instead of ambien. Please advise. If approved please route to pool B so nurse can phone in to pharmacy

## 2013-01-16 ENCOUNTER — Other Ambulatory Visit: Payer: Self-pay | Admitting: Nurse Practitioner

## 2013-01-16 ENCOUNTER — Ambulatory Visit (INDEPENDENT_AMBULATORY_CARE_PROVIDER_SITE_OTHER): Payer: Medicare Other | Admitting: Family Medicine

## 2013-01-16 ENCOUNTER — Encounter: Payer: Self-pay | Admitting: Family Medicine

## 2013-01-16 VITALS — BP 134/70 | HR 57 | Temp 98.8°F | Ht 67.0 in | Wt 323.0 lb

## 2013-01-16 DIAGNOSIS — H60399 Other infective otitis externa, unspecified ear: Secondary | ICD-10-CM

## 2013-01-16 DIAGNOSIS — L0390 Cellulitis, unspecified: Secondary | ICD-10-CM

## 2013-01-16 DIAGNOSIS — L0291 Cutaneous abscess, unspecified: Secondary | ICD-10-CM

## 2013-01-16 DIAGNOSIS — H6091 Unspecified otitis externa, right ear: Secondary | ICD-10-CM

## 2013-01-16 LAB — SPECIMEN STATUS REPORT

## 2013-01-16 MED ORDER — NEOMYCIN-POLYMYXIN-HC 3.5-10000-1 OT SOLN: 3.0000 [drp] | Freq: Four times a day (QID) | OTIC | Status: DC

## 2013-01-16 MED ORDER — DOXYCYCLINE HYCLATE 100 MG PO CAPS: 100.0000 mg | ORAL_CAPSULE | Freq: Two times a day (BID) | ORAL | Status: DC

## 2013-01-16 NOTE — Progress Notes (Signed)
  Subjective:    Patient ID: Julia West, female    DOB: 11/17/1955, 57 y.o.   MRN: [id number]  HPI  Pt presents today with cellulitis of central forehead.  Pt was seen last week for flu shot.  Noticed area of redness on forehead.  Began to scratch area.  Has had progressive redness and pain.  No purulent drainage.  No headache or vision changes.   Has also had some R sided ear pain and discomfort over similar time frame.  No loss of hearing.   Pt is nondiabetic.     Review of Systems  All other systems reviewed and are negative.       Objective:   Physical Exam  Constitutional: She appears well-developed and well-nourished.  HENT:  Head: Normocephalic.    Left Ear: External ear normal.  R ear canal erythema and tenderness to otoscopic evaluation  2-3cm x 2-3 cm area of erythema, swelling and mild TTP between eyebrows   Eyes: Conjunctivae are normal. Pupils are equal, round, and reactive to light.  Neck: Normal range of motion.  Cardiovascular: Normal rate and regular rhythm.   Pulmonary/Chest: Effort normal and breath sounds normal.  Abdominal: Soft.  Musculoskeletal: Normal range of motion.  Neurological: She is alert.          Assessment & Plan:  Cellulitis - Plan: doxycycline (VIBRAMYCIN) 100 MG capsule  OE (otitis externa), right - Plan: neomycin-polymyxin-hydrocortisone (CORTISPORIN) otic solution  Wil place on doxy for soft tissue coverage Cortisporin otic for affected ear  Discussed infectious, derm and ENT red flags.  Follow up as needed.

## 2013-01-17 LAB — PAP IG W/ RFLX HPV ASCU

## 2013-01-18 ENCOUNTER — Other Ambulatory Visit: Payer: Self-pay | Admitting: Nurse Practitioner

## 2013-01-18 NOTE — Telephone Encounter (Signed)
rx called into pharmacy

## 2013-01-19 ENCOUNTER — Telehealth: Payer: Self-pay | Admitting: Family Medicine

## 2013-01-19 NOTE — Telephone Encounter (Signed)
Patient is very upset she has already been in here 2 times she will just go somewhere else if we can not help her.

## 2013-01-19 NOTE — Telephone Encounter (Signed)
Patient was seen for cellulitis of the central forehead. Patient needs to come in for reevaluation if she feels that her headache is worsening or she needs an antibiotic.

## 2013-03-01 ENCOUNTER — Other Ambulatory Visit: Payer: Self-pay | Admitting: Nurse Practitioner

## 2013-03-05 NOTE — Telephone Encounter (Signed)
Called onto [facility]

## 2013-03-05 NOTE — Telephone Encounter (Signed)
Please call in ambien rx 

## 2013-03-05 NOTE — Telephone Encounter (Signed)
Last seen 01/26/13  Dr Alvester Morin   If approved route to nurse to call into [facility]  [phone]

## 2013-03-31 ENCOUNTER — Other Ambulatory Visit: Payer: Self-pay | Admitting: Nurse Practitioner

## 2013-04-02 ENCOUNTER — Telehealth: Payer: Self-pay | Admitting: Nurse Practitioner

## 2013-04-02 MED ORDER — ZOLPIDEM TARTRATE 10 MG PO TABS: 10.0000 mg | ORAL_TABLET | Freq: Every day | ORAL | Status: DC

## 2013-04-02 NOTE — Telephone Encounter (Signed)
Called into [facility]

## 2013-04-02 NOTE — Telephone Encounter (Signed)
Please call in ambien 10mg  1 po qhs #30 0 refills

## 2013-04-30 ENCOUNTER — Other Ambulatory Visit: Payer: Self-pay | Admitting: Nurse Practitioner

## 2013-05-01 NOTE — Telephone Encounter (Signed)
Last seen 01/12/13, last filled 04/02/13. Call into [place] [phone]

## 2013-05-02 ENCOUNTER — Telehealth: Payer: Self-pay | Admitting: Nurse Practitioner

## 2013-05-02 IMAGING — CT CT ABD-PELV W/ CM
2 of 5 series · 16 of 46 positions shown, 18 images · IV contrast (APPLIED)
Comparison: MRI of the abdomen and pelvis performed 01/04/2012

CLINICAL DATA: Incarcerated abdominal hernia at the right lower
quadrant.  Worsening pain and nausea.

CT ABDOMEN AND PELVIS WITH CONTRAST
TECHNIQUE: Multidetector CT imaging of the abdomen and pelvis was
performed following the standard protocol during bolus
administration of intravenous contrast.
Contrast: 100mL OMNIPAQUE IOHEXOL 300 MG/ML  SOLN

[Series 2: abd/pelv with 5.0 b31f st · axial · 0.88mm/px · z∈[-394,+31]mm · 13 of 97 slices shown, 15 images]
[im 6/97  soft-tissue]
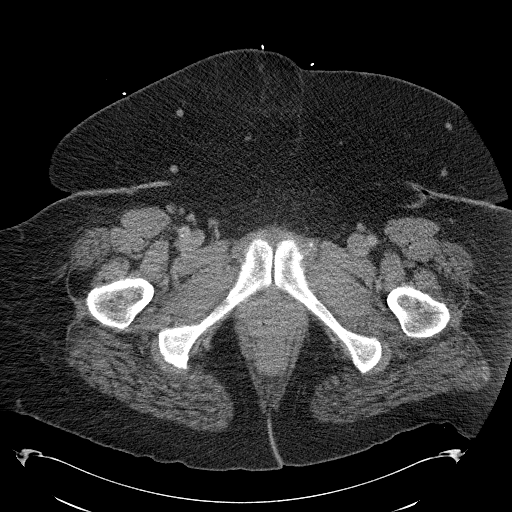
[im 6/97  bone]
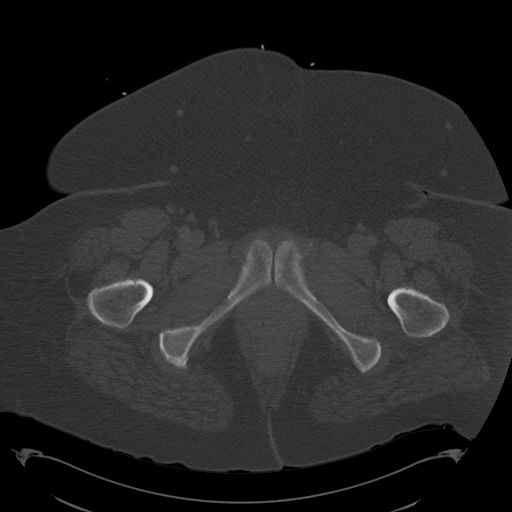
[im 11/97  soft-tissue]
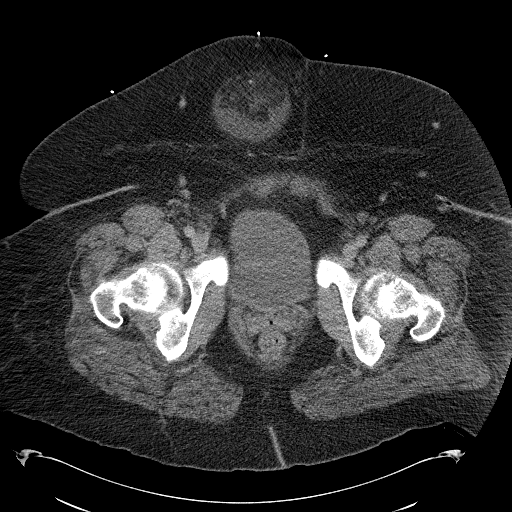
[im 22/97  soft-tissue]
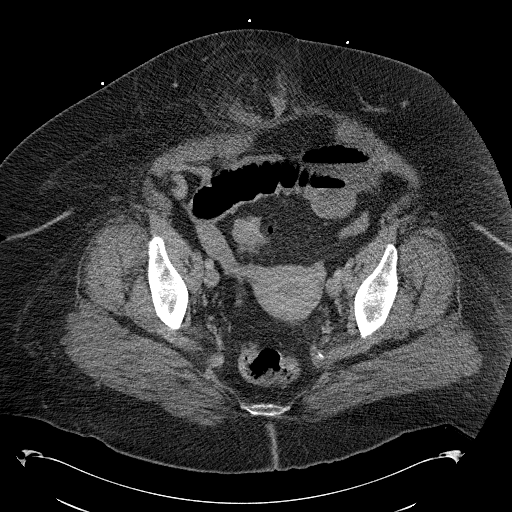
[im 27/97  soft-tissue]
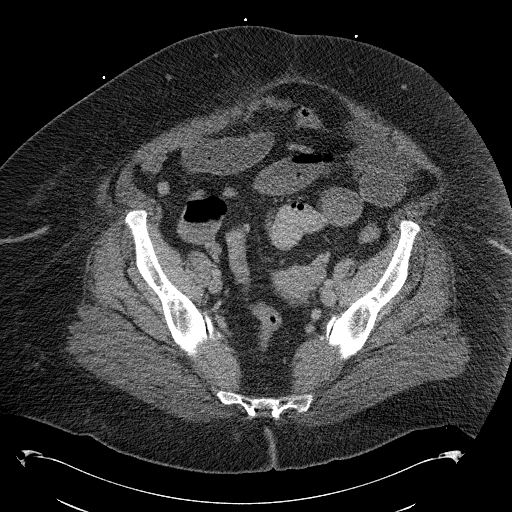
[im 33/97  soft-tissue]
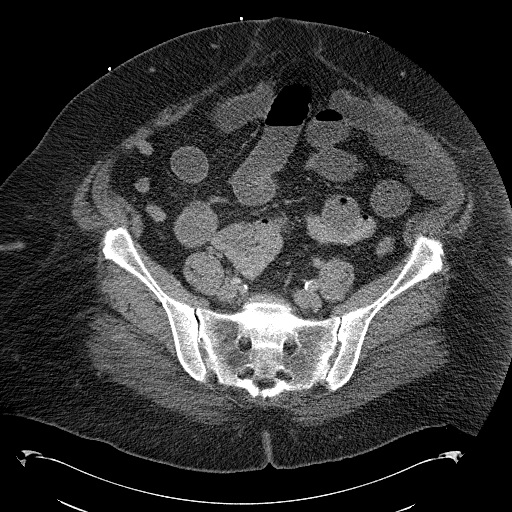
[im 43/97  soft-tissue]
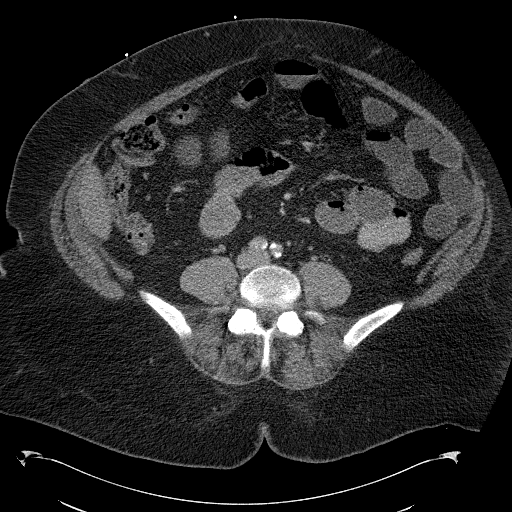
[im 49/97  soft-tissue]
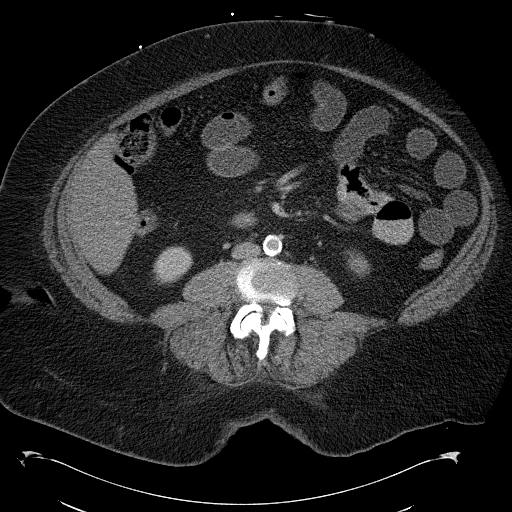
[im 54/97  soft-tissue]
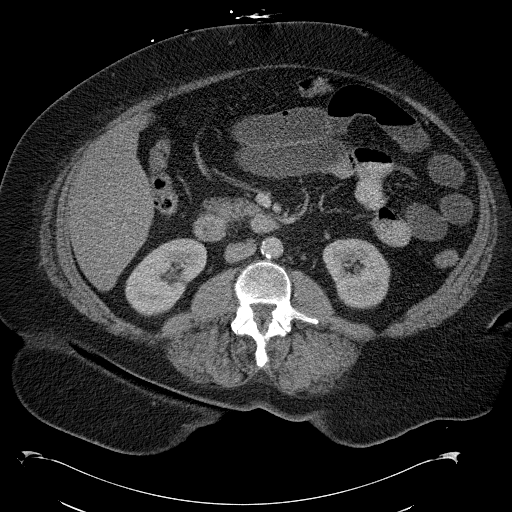
[im 65/97  soft-tissue]
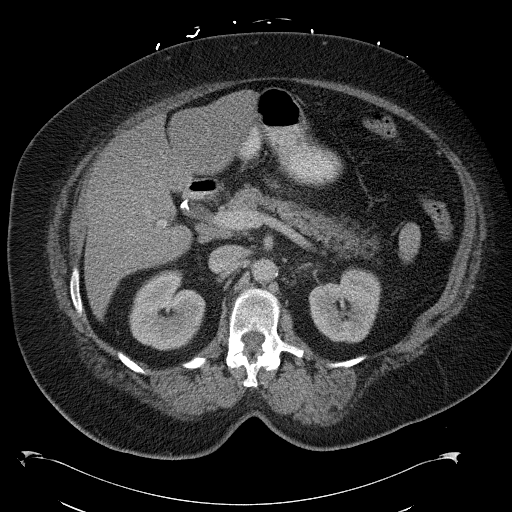
[im 65/97  bone]
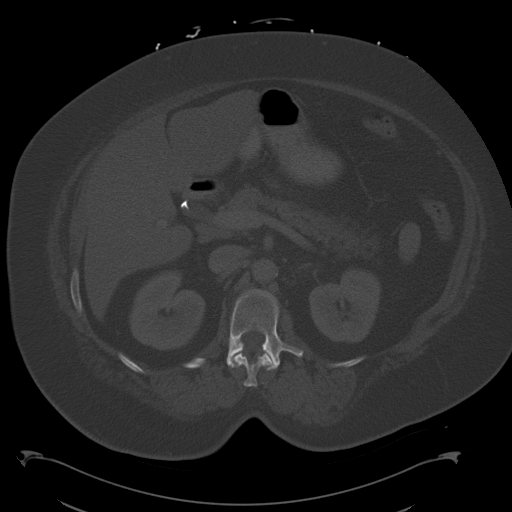
[im 70/97  soft-tissue]
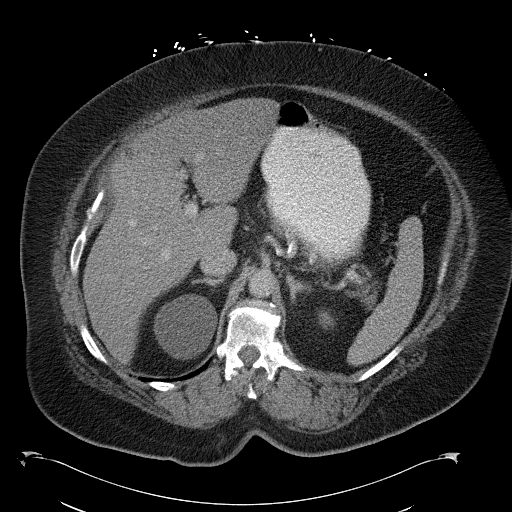
[im 75/97  soft-tissue]
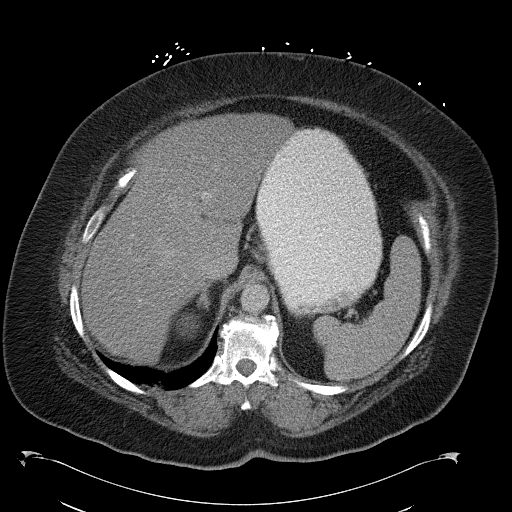
[im 86/97  soft-tissue]
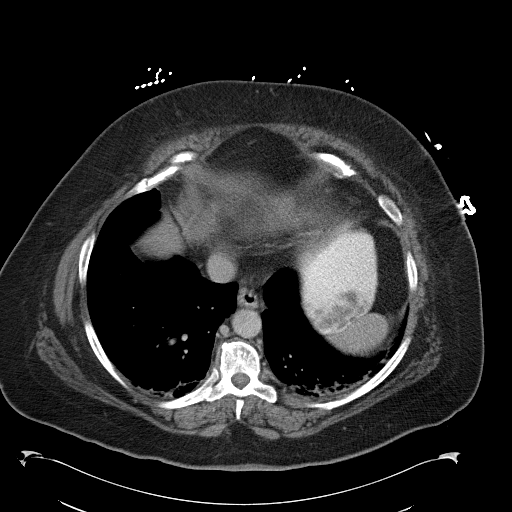
[im 91/97  soft-tissue]
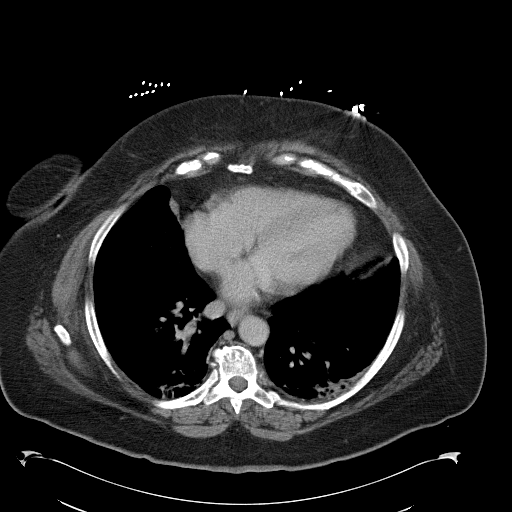

[Series 602: cor · coronal · 0.95mm/px · 3 of 193 slices shown]
[im 65/193  soft-tissue]
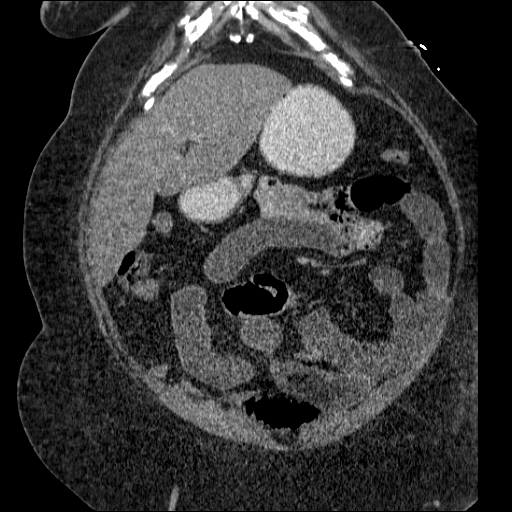
[im 86/193  soft-tissue]
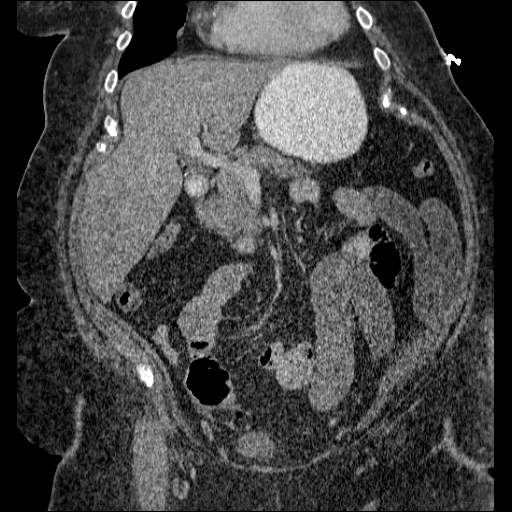
[im 107/193  soft-tissue]
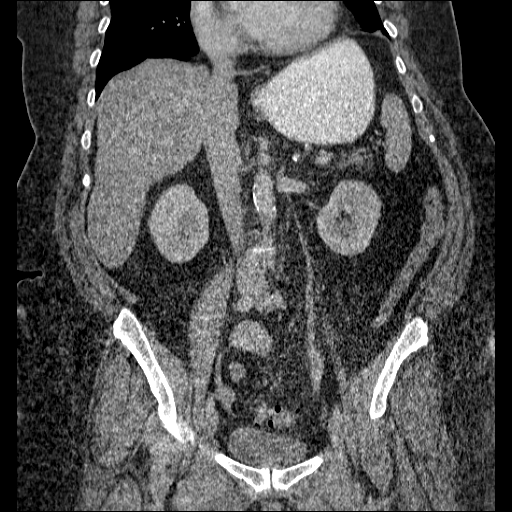

[16 of 46 positions shown; findings below may reference images not displayed]

FINDINGS: Mild bibasilar atelectasis is noted.  The patient is
status post median sternotomy.

The liver and spleen are unremarkable in appearance.  The
gallbladder is within normal limits.  The pancreas and adrenal
glands are unremarkable; slight apparent stranding about the tail
of the pancreas likely reflects normal vasculature.

A 5.8 cm cyst is noted at the upper pole of the right kidney.  The
kidneys are otherwise unremarkable in appearance.  There is no
evidence of hydronephrosis.  No renal or ureteral stones are seen.
No perinephric stranding is appreciated.

The stomach is within normal limits.  No acute vascular
abnormalities are seen.  Diffuse calcification is noted along the
abdominal aorta and its branches.

The appendix is not definitely seen; there is no evidence for
appendicitis.  The colon is largely decompressed.  Diffuse
diverticulosis is noted along the proximal sigmoid colon; the colon
is otherwise unremarkable in appearance.

A moderate-to-large anterior abdominal wall hernia is noted at the
right lower quadrant, with a short segment of herniated small
bowel.  A small amount of associated fluid is seen, and soft tissue
stranding is noted about the herniated small bowel segment.  A
small broad-based hernia is noted at the level of the umbilicus.

There is no evidence for bowel obstruction.

The bladder is mildly distended and grossly unremarkable in
appearance.  The uterus is within normal limits.  The ovaries are
relatively symmetric; no suspicious adnexal masses are seen.  No
inguinal lymphadenopathy is seen.

No acute osseous abnormalities are identified.
IMPRESSION: 1.  Moderate to large anterior abdominal wall hernia at the right
lower quadrant, with a short segment of herniated small bowel.
Associated soft tissue inflammation and small amount of associated
fluid; no evidence for bowel obstruction.
2.  Smaller broad-based hernia at the level of the umbilicus,
containing only fat.
3.  5.8 cm right renal cyst noted.
4.  Mild bibasilar atelectasis seen.
5.  Diffuse calcification along the abdominal aorta and its
branches.
6.  Diffuse diverticulosis along the proximal sigmoid colon,
without evidence of diverticulitis.

## 2013-05-02 NOTE — Telephone Encounter (Signed)
Called in.

## 2013-05-02 NOTE — Telephone Encounter (Signed)
Please call in ambien with 1 refills 

## 2013-05-03 MED ORDER — ZOLPIDEM TARTRATE 10 MG PO TABS: ORAL_TABLET | ORAL | Status: DC

## 2013-05-03 NOTE — Telephone Encounter (Signed)
Please call in ambien 10 mg 1 po qhs with 1 refills

## 2013-05-03 NOTE — Telephone Encounter (Signed)
This was called in yesterday.

## 2013-05-22 ENCOUNTER — Telehealth: Payer: Self-pay | Admitting: Nurse Practitioner

## 2013-05-22 NOTE — Telephone Encounter (Signed)
Clear nasal discharge and post nasal drainage. No facial pain/pressure. Sounds more like allergies. Sinus infection not likely. She has taken one dose of Allegra and has used Flonase. Suggested she continue the Flonase and allegra or another antihistamine. Give it a few days and if symptoms worsen or don't improve she should schedule an appt. Patient stated understanding and agreement to plan.

## 2013-05-25 ENCOUNTER — Encounter: Payer: Self-pay | Admitting: General Practice

## 2013-05-25 ENCOUNTER — Ambulatory Visit (INDEPENDENT_AMBULATORY_CARE_PROVIDER_SITE_OTHER): Payer: Medicare Other | Admitting: General Practice

## 2013-05-25 VITALS — BP 143/77 | HR 84 | Temp 97.8°F | Ht 67.0 in | Wt 346.8 lb

## 2013-05-25 DIAGNOSIS — J019 Acute sinusitis, unspecified: Secondary | ICD-10-CM

## 2013-05-25 MED ORDER — AZITHROMYCIN 250 MG PO TABS: ORAL_TABLET | ORAL | Status: DC

## 2013-05-25 NOTE — Patient Instructions (Signed)

## 2013-05-25 NOTE — Progress Notes (Signed)
   Subjective:    Patient ID: Julia West, female    DOB: 1955-05-13, 58 y.o.   MRN: [id number]  Sinusitis This is a new problem. The current episode started in the past 7 days. The problem has been gradually worsening since onset. There has been no fever. Associated symptoms include congestion and sinus pressure. Pertinent negatives include no coughing or sore throat. Past treatments include nothing.      Review of Systems  HENT: Positive for congestion and sinus pressure. Negative for sore throat.   Respiratory: Negative for cough and chest tightness.   Cardiovascular: Negative for chest pain and palpitations.       Objective:   Physical Exam  Constitutional: She appears well-developed and well-nourished.  HENT:  Head: Normocephalic and atraumatic.  Nose: Right sinus exhibits maxillary sinus tenderness and frontal sinus tenderness. Left sinus exhibits maxillary sinus tenderness and frontal sinus tenderness.  Mouth/Throat: Posterior oropharyngeal erythema present.  Cardiovascular: Normal rate, regular rhythm and normal heart sounds.   No murmur heard. Pulmonary/Chest: Effort normal and breath sounds normal. No respiratory distress. She exhibits no tenderness.  Neurological: She is alert.  Skin: Skin is warm and dry. No rash noted.  Psychiatric: She has a normal mood and affect.          Assessment & Plan:  1. Acute sinusitis - azithromycin (ZITHROMAX) 250 MG tablet; Take as directed  Dispense: 6 tablet; Refill: 0 -adequate hydration -RTO if symptoms worsen or unresolved Patient verbalized understanding Erby Pian, FNP-C

## 2013-06-02 ENCOUNTER — Telehealth: Payer: Self-pay | Admitting: Nurse Practitioner

## 2013-06-02 NOTE — Telephone Encounter (Signed)
Patient is very upset wanting this done

## 2013-06-04 ENCOUNTER — Other Ambulatory Visit: Payer: Self-pay | Admitting: Nurse Practitioner

## 2013-06-04 MED ORDER — FUROSEMIDE 20 MG PO TABS: 20.0000 mg | ORAL_TABLET | Freq: Every day | ORAL | Status: DC

## 2013-06-04 NOTE — Telephone Encounter (Signed)
Already taken care of

## 2013-07-04 ENCOUNTER — Telehealth: Payer: Self-pay | Admitting: Nurse Practitioner

## 2013-07-04 MED ORDER — ZOLPIDEM TARTRATE 10 MG PO TABS: ORAL_TABLET | ORAL | Status: DC

## 2013-07-04 NOTE — Telephone Encounter (Signed)
Please call in ambien 10mg  1 po QHS #30 with 1 refills

## 2013-07-04 NOTE — Telephone Encounter (Signed)
Left refill authorization on voicemail

## 2013-07-23 ENCOUNTER — Telehealth: Payer: Self-pay | Admitting: Nurse Practitioner

## 2013-07-23 DIAGNOSIS — M25561 Pain in right knee: Secondary | ICD-10-CM

## 2013-07-23 NOTE — Telephone Encounter (Signed)
Referral made to ortho but can't promise can get appointment this week.

## 2013-08-28 ENCOUNTER — Other Ambulatory Visit: Payer: Self-pay | Admitting: Nurse Practitioner

## 2013-08-30 NOTE — Telephone Encounter (Signed)
Last seen 05/25/13, last filled [facility]/02/13, [facility]

## 2013-08-31 ENCOUNTER — Telehealth: Payer: Self-pay | Admitting: Nurse Practitioner

## 2013-08-31 NOTE — Telephone Encounter (Signed)
[facility]

## 2013-09-12 ENCOUNTER — Ambulatory Visit (INDEPENDENT_AMBULATORY_CARE_PROVIDER_SITE_OTHER): Payer: Medicare Other | Admitting: Nurse Practitioner

## 2013-09-12 ENCOUNTER — Encounter: Payer: Self-pay | Admitting: Nurse Practitioner

## 2013-09-12 ENCOUNTER — Telehealth: Payer: Self-pay | Admitting: Nurse Practitioner

## 2013-09-12 VITALS — BP 130/62 | HR 68 | Temp 98.1°F | Ht 67.0 in | Wt 341.0 lb

## 2013-09-12 DIAGNOSIS — I10 Essential (primary) hypertension: Secondary | ICD-10-CM

## 2013-09-12 DIAGNOSIS — I2510 Atherosclerotic heart disease of native coronary artery without angina pectoris: Secondary | ICD-10-CM

## 2013-09-12 DIAGNOSIS — R635 Abnormal weight gain: Secondary | ICD-10-CM

## 2013-09-12 DIAGNOSIS — Z713 Dietary counseling and surveillance: Secondary | ICD-10-CM

## 2013-09-12 DIAGNOSIS — E785 Hyperlipidemia, unspecified: Secondary | ICD-10-CM

## 2013-09-12 MED ORDER — HYDROCODONE-ACETAMINOPHEN 5-325 MG PO TABS: 1.0000 | ORAL_TABLET | Freq: Four times a day (QID) | ORAL | Status: DC | PRN

## 2013-09-12 MED ORDER — ROSUVASTATIN CALCIUM 10 MG PO TABS: 10.0000 mg | ORAL_TABLET | Freq: Every day | ORAL | Status: DC

## 2013-09-12 MED ORDER — FUROSEMIDE 20 MG PO TABS: 20.0000 mg | ORAL_TABLET | Freq: Every day | ORAL | Status: DC

## 2013-09-12 MED ORDER — METOPROLOL TARTRATE 50 MG PO TABS: 50.0000 mg | ORAL_TABLET | Freq: Two times a day (BID) | ORAL | Status: DC

## 2013-09-12 MED ORDER — VALSARTAN-HYDROCHLOROTHIAZIDE 160-12.5 MG PO TABS: 1.0000 | ORAL_TABLET | Freq: Every day | ORAL | Status: DC

## 2013-09-12 NOTE — Progress Notes (Deleted)
Subjective:    Patient ID: Julia West, female    DOB: 10/09/1955, 58 y.o.   MRN: [id number] Patient is here today for chronic follow up.   Hypertension This is a chronic problem. The current episode started more than 1 year ago. The problem has been waxing and waning since onset. The problem is controlled. Associated symptoms include anxiety and peripheral edema. Pertinent negatives include no blurred vision, chest pain, palpitations or shortness of breath. Risk factors for coronary artery disease include dyslipidemia, family history and post-menopausal state. Past treatments include diuretics and calcium channel blockers. The current treatment provides moderate improvement. Compliance problems include exercise.  There is no history of a thyroid problem.  Hyperlipidemia This is a chronic problem. The current episode started more than 1 year ago. The problem is uncontrolled. Recent lipid tests were reviewed and are high. Exacerbating diseases include obesity. She has no history of diabetes. Associated symptoms include leg pain and myalgias. Pertinent negatives include no chest pain or shortness of breath. Treatments tried: Pt was taking Crestor, but had joint pain and stopped four months ago. The current treatment provides no improvement of lipids. Compliance problems include medication side effects and medication cost.  Risk factors for coronary artery disease include post-menopausal, hypertension, obesity and dyslipidemia.  Anxiety Presents for follow-up visit. Symptoms include insomnia. Patient reports no chest pain, depressed mood, excessive worry, nervous/anxious behavior, palpitations, panic or shortness of breath. Symptoms occur occasionally. The quality of sleep is fair. Nighttime awakenings: occasional.   There is no history of anxiety/panic attacks, fibromyalgia or hyperthyroidism. Past treatments include benzodiazephines. The treatment provided significant relief. Compliance with prior  treatments has been good.  Insomnia Pt use to take ambien but never follow-up for refills-Has been taking xanax .25 at bedtime it works well. No complaints Peripheral Edema Pt takes lasix daily-Pt states it is working well-Edema decreased in lower extremities     Review of Systems  Eyes: Negative for blurred vision.  Respiratory: Negative for shortness of breath.   Cardiovascular: Negative for chest pain and palpitations.  Musculoskeletal: Positive for myalgias.  Psychiatric/Behavioral: The patient has insomnia. The patient is not nervous/anxious.        Objective:   Physical Exam  Constitutional: She is oriented to person, place, and time. She appears well-developed and well-nourished.  HENT:  Head: Normocephalic.  Right Ear: Hearing, tympanic membrane, external ear and ear canal normal.  Left Ear: Hearing, tympanic membrane, external ear and ear canal normal.  Nose: Nose normal.  Mouth/Throat: Uvula is midline and oropharynx is clear and moist.  Eyes: Conjunctivae and EOM are normal. Pupils are equal, round, and reactive to light.  Neck: Normal range of motion and full passive range of motion without pain. Neck supple. No JVD present. Carotid bruit is not present. No mass and no thyromegaly present.  Cardiovascular: Normal rate, normal heart sounds and intact distal pulses.   No murmur heard. Pulmonary/Chest: Effort normal and breath sounds normal. Right breast exhibits no inverted nipple, no mass, no nipple discharge, no skin change and no tenderness. Left breast exhibits no inverted nipple, no mass, no nipple discharge, no skin change and no tenderness.  Abdominal: Soft. Bowel sounds are normal. She exhibits no mass. There is no tenderness.  Genitourinary: Vagina normal and uterus normal. No breast swelling, tenderness, discharge or bleeding.  bimanual exam-No adnexal masses or tenderness. Cervix parous and pink- no discharge  Musculoskeletal: Normal range of motion.   Lymphadenopathy:    She has no cervical   adenopathy.  Neurological: She is alert and oriented to person, place, and time.  Skin: Skin is warm and dry.  Psychiatric: She has a normal mood and affect. Her behavior is normal. Judgment and thought content normal.    BP 130/62  Pulse 68  Temp(Src) 98.1 F (36.7 C) (Oral)  Ht 5' 7" (1.702 m)  Wt 341 lb (154.677 kg)  BMI 53.40 kg/m2       Assessment & Plan:   1. DYSLIPIDEMIA   2. HYPERTENSION   3. CAD   4. OBESITY, MORBID   5. Weight loss counseling, encounter for    Orders Placed This Encounter  Procedures  . CMP14+EGFR  . NMR, lipoprofile   Meds ordered this encounter  Medications  . DISCONTD: HYDROcodone-acetaminophen (NORCO/VICODIN) 5-325 MG per tablet    Sig: Take by mouth. Take 1 tablet 2-3 times daily as needed for pain  . HYDROcodone-acetaminophen (NORCO/VICODIN) 5-325 MG per tablet    Sig: Take 1 tablet by mouth every 6 (six) hours as needed for moderate pain. Take 1 tablet 2-3 times daily as needed for pain    Dispense:  30 tablet    Refill:  0    Order Specific Question:  Supervising Provider    Answer:  Chipper Herb [[id number]]  . valsartan-hydrochlorothiazide (DIOVAN-HCT) 160-12.5 MG per tablet    Sig: Take 1 tablet by mouth daily.    Dispense:  90 tablet    Refill:  3    Order Specific Question:  Supervising Provider    Answer:  Chipper Herb [[id number]]  . rosuvastatin (CRESTOR) 10 MG tablet    Sig: Take 1 tablet (10 mg total) by mouth at bedtime.    Dispense:  90 tablet    Refill:  1    Order Specific Question:  Supervising Provider    Answer:  Chipper Herb [[id number]]  . metoprolol (LOPRESSOR) 50 MG tablet    Sig: Take 1 tablet (50 mg total) by mouth 2 (two) times daily.    Dispense:  180 tablet    Refill:  1    Order Specific Question:  Supervising Provider    Answer:  Chipper Herb [[id number]]  . furosemide (LASIX) 20 MG tablet    Sig: Take 1 tablet (20 mg total) by mouth daily.    Dispense:  90 tablet     Refill:  1    Order Specific Question:  Supervising Provider    Answer:  Chipper Herb [[id number]]    Labs pending Health maintenance reviewed Diet and exercise encouraged Continue all meds Follow up  In 3 mnths    Mary-Margaret Hassell Done, FNP

## 2013-09-12 NOTE — Progress Notes (Deleted)
Subjective:    Patient ID: Julia West, female    DOB: 07/25/1955, 58 y.o.   MRN: [id number] Patient is here today for chronic follow up.   Hypertension This is a chronic problem. The current episode started more than 1 year ago. The problem has been waxing and waning since onset. The problem is controlled. Associated symptoms include anxiety and peripheral edema. Pertinent negatives include no blurred vision, chest pain, palpitations or shortness of breath. Risk factors for coronary artery disease include dyslipidemia, family history and post-menopausal state. Past treatments include diuretics and calcium channel blockers. The current treatment provides moderate improvement. Compliance problems include exercise.  There is no history of a thyroid problem.  Hyperlipidemia This is a chronic problem. The current episode started more than 1 year ago. The problem is uncontrolled. Recent lipid tests were reviewed and are high. Exacerbating diseases include obesity. She has no history of diabetes. Associated symptoms include leg pain and myalgias. Pertinent negatives include no chest pain or shortness of breath. Treatments tried: Pt was taking Crestor, but had joint pain and stopped four months ago. The current treatment provides no improvement of lipids. Compliance problems include medication side effects and medication cost.  Risk factors for coronary artery disease include post-menopausal, hypertension, obesity and dyslipidemia.  Anxiety Presents for follow-up visit. Symptoms include insomnia. Patient reports no chest pain, depressed mood, excessive worry, nervous/anxious behavior, palpitations, panic or shortness of breath. Symptoms occur occasionally. The quality of sleep is fair. Nighttime awakenings: occasional.   There is no history of anxiety/panic attacks, fibromyalgia or hyperthyroidism. Past treatments include benzodiazephines. The treatment provided significant relief. Compliance with prior  treatments has been good.  Insomnia Pt use to take ambien but never follow-up for refills-Has been taking xanax .25 at bedtime it works well. No complaints Peripheral Edema Pt takes lasix daily-Pt states it is working well-Edema decreased in lower extremities   * abdominal wound finally healed  Review of Systems  Eyes: Negative for blurred vision.  Respiratory: Negative for shortness of breath.   Cardiovascular: Negative for chest pain and palpitations.  Musculoskeletal: Positive for myalgias.  Psychiatric/Behavioral: The patient has insomnia. The patient is not nervous/anxious.        Objective:   Physical Exam  Constitutional: She is oriented to person, place, and time. She appears well-developed and well-nourished.  HENT:  Head: Normocephalic.  Right Ear: Hearing, tympanic membrane, external ear and ear canal normal.  Left Ear: Hearing, tympanic membrane, external ear and ear canal normal.  Nose: Nose normal.  Mouth/Throat: Uvula is midline and oropharynx is clear and moist.  Eyes: Conjunctivae and EOM are normal. Pupils are equal, round, and reactive to light.  Neck: Normal range of motion and full passive range of motion without pain. Neck supple. No JVD present. Carotid bruit is not present. No mass and no thyromegaly present.  Cardiovascular: Normal rate, normal heart sounds and intact distal pulses.   No murmur heard. Pulmonary/Chest: Effort normal and breath sounds normal. Right breast exhibits no inverted nipple, no mass, no nipple discharge, no skin change and no tenderness. Left breast exhibits no inverted nipple, no mass, no nipple discharge, no skin change and no tenderness.  Abdominal: Soft. Bowel sounds are normal. She exhibits no mass. There is no tenderness.  Genitourinary: Vagina normal and uterus normal. No breast swelling, tenderness, discharge or bleeding.  bimanual exam-No adnexal masses or tenderness. Cervix parous and pink- no discharge  Musculoskeletal:  Normal range of motion.  Lymphadenopathy:      She has no cervical adenopathy.  Neurological: She is alert and oriented to person, place, and time.  Skin: Skin is warm and dry.  Psychiatric: She has a normal mood and affect. Her behavior is normal. Judgment and thought content normal.    BP 130/62  Pulse 68  Temp(Src) 98.1 F (36.7 C) (Oral)  Ht 5' 7" (1.702 m)  Wt 341 lb (154.677 kg)  BMI 53.40 kg/m2       Assessment & Plan:   1. DYSLIPIDEMIA   2. HYPERTENSION   3. CAD   4. OBESITY, MORBID   5. Weight loss counseling, encounter for    Orders Placed This Encounter  Procedures  . CMP14+EGFR  . NMR, lipoprofile   Meds ordered this encounter  Medications  . DISCONTD: HYDROcodone-acetaminophen (NORCO/VICODIN) 5-325 MG per tablet    Sig: Take by mouth. Take 1 tablet 2-3 times daily as needed for pain  . HYDROcodone-acetaminophen (NORCO/VICODIN) 5-325 MG per tablet    Sig: Take 1 tablet by mouth every 6 (six) hours as needed for moderate pain. Take 1 tablet 2-3 times daily as needed for pain    Dispense:  30 tablet    Refill:  0    Order Specific Question:  Supervising Provider    Answer:  MOORE, DONALD W [[id number]]  . valsartan-hydrochlorothiazide (DIOVAN-HCT) 160-12.5 MG per tablet    Sig: Take 1 tablet by mouth daily.    Dispense:  90 tablet    Refill:  3    Order Specific Question:  Supervising Provider    Answer:  MOORE, DONALD W [[id number]]  . rosuvastatin (CRESTOR) 10 MG tablet    Sig: Take 1 tablet (10 mg total) by mouth at bedtime.    Dispense:  90 tablet    Refill:  1    Order Specific Question:  Supervising Provider    Answer:  MOORE, DONALD W [[id number]]  . metoprolol (LOPRESSOR) 50 MG tablet    Sig: Take 1 tablet (50 mg total) by mouth 2 (two) times daily.    Dispense:  180 tablet    Refill:  1    Order Specific Question:  Supervising Provider    Answer:  MOORE, DONALD W [[id number]]  . furosemide (LASIX) 20 MG tablet    Sig: Take 1 tablet (20 mg total) by mouth daily.     Dispense:  90 tablet    Refill:  1    Order Specific Question:  Supervising Provider    Answer:  MOORE, DONALD W [[id number]]   hemoccult cards given to patient with directions Labs pending Health maintenance reviewed Diet and exercise encouraged Continue all meds Follow up  In 3 months   Mary-Margaret Martin, FNP    

## 2013-09-12 NOTE — Progress Notes (Deleted)
Subjective:    Patient ID: Julia West, female    DOB: 06/26/1955, 58 y.o.   MRN: [id number] Patient is here today for chronic follow up.   Hypertension This is a chronic problem. The current episode started more than 1 year ago. The problem has been waxing and waning since onset. The problem is controlled. Associated symptoms include anxiety and peripheral edema. Pertinent negatives include no blurred vision, chest pain, palpitations or shortness of breath. Risk factors for coronary artery disease include dyslipidemia, family history and post-menopausal state. Past treatments include diuretics and calcium channel blockers. The current treatment provides moderate improvement. Compliance problems include exercise.  There is no history of a thyroid problem.  Hyperlipidemia This is a chronic problem. The current episode started more than 1 year ago. The problem is uncontrolled. Recent lipid tests were reviewed and are high. Exacerbating diseases include obesity. She has no history of diabetes. Associated symptoms include leg pain and myalgias. Pertinent negatives include no chest pain or shortness of breath. Treatments tried: Pt was taking Crestor, but had joint pain and stopped four months ago. The current treatment provides no improvement of lipids. Compliance problems include medication side effects and medication cost.  Risk factors for coronary artery disease include post-menopausal, hypertension, obesity and dyslipidemia.  Anxiety Presents for follow-up visit. Symptoms include insomnia. Patient reports no chest pain, depressed mood, excessive worry, nervous/anxious behavior, palpitations, panic or shortness of breath. Symptoms occur occasionally. The quality of sleep is fair. Nighttime awakenings: occasional.   There is no history of anxiety/panic attacks, fibromyalgia or hyperthyroidism. Past treatments include benzodiazephines. The treatment provided significant relief. Compliance with prior  treatments has been good.  Insomnia Pt use to take ambien but never follow-up for refills-Has been taking xanax .25 at bedtime it works well. No complaints Peripheral Edema Pt takes lasix daily-Pt states it is working well-Edema decreased in lower extremities     Review of Systems  Eyes: Negative for blurred vision.  Respiratory: Negative for shortness of breath.   Cardiovascular: Negative for chest pain and palpitations.  Musculoskeletal: Positive for myalgias.  Psychiatric/Behavioral: The patient has insomnia. The patient is not nervous/anxious.        Objective:   Physical Exam  Constitutional: She is oriented to person, place, and time. She appears well-developed and well-nourished.  HENT:  Head: Normocephalic.  Right Ear: Hearing, tympanic membrane, external ear and ear canal normal.  Left Ear: Hearing, tympanic membrane, external ear and ear canal normal.  Nose: Nose normal.  Mouth/Throat: Uvula is midline and oropharynx is clear and moist.  Eyes: Conjunctivae and EOM are normal. Pupils are equal, round, and reactive to light.  Neck: Normal range of motion and full passive range of motion without pain. Neck supple. No JVD present. Carotid bruit is not present. No mass and no thyromegaly present.  Cardiovascular: Normal rate, normal heart sounds and intact distal pulses.   No murmur heard. Pulmonary/Chest: Effort normal and breath sounds normal. Right breast exhibits no inverted nipple, no mass, no nipple discharge, no skin change and no tenderness. Left breast exhibits no inverted nipple, no mass, no nipple discharge, no skin change and no tenderness.  Abdominal: Soft. Bowel sounds are normal. She exhibits no mass. There is no tenderness.  Genitourinary: Vagina normal and uterus normal. No breast swelling, tenderness, discharge or bleeding.  bimanual exam-No adnexal masses or tenderness. Cervix parous and pink- no discharge  Musculoskeletal: Normal range of motion.   Lymphadenopathy:    She has no cervical   adenopathy.  Neurological: She is alert and oriented to person, place, and time.  Skin: Skin is warm and dry.  Psychiatric: She has a normal mood and affect. Her behavior is normal. Judgment and thought content normal.    BP 130/62  Pulse 68  Temp(Src) 98.1 F (36.7 C) (Oral)  Ht 5' 7" (1.702 m)  Wt 341 lb (154.677 kg)  BMI 53.40 kg/m2       Assessment & Plan:   1. DYSLIPIDEMIA   2. HYPERTENSION   3. CAD   4. OBESITY, MORBID   5. Weight loss counseling, encounter for    Orders Placed This Encounter  Procedures  . CMP14+EGFR  . NMR, lipoprofile   Meds ordered this encounter  Medications  . DISCONTD: HYDROcodone-acetaminophen (NORCO/VICODIN) 5-325 MG per tablet    Sig: Take by mouth. Take 1 tablet 2-3 times daily as needed for pain  . HYDROcodone-acetaminophen (NORCO/VICODIN) 5-325 MG per tablet    Sig: Take 1 tablet by mouth every 6 (six) hours as needed for moderate pain. Take 1 tablet 2-3 times daily as needed for pain    Dispense:  30 tablet    Refill:  0    Order Specific Question:  Supervising Provider    Answer:  Chipper Herb [[id number]]  . valsartan-hydrochlorothiazide (DIOVAN-HCT) 160-12.5 MG per tablet    Sig: Take 1 tablet by mouth daily.    Dispense:  90 tablet    Refill:  3    Order Specific Question:  Supervising Provider    Answer:  Chipper Herb [[id number]]  . rosuvastatin (CRESTOR) 10 MG tablet    Sig: Take 1 tablet (10 mg total) by mouth at bedtime.    Dispense:  90 tablet    Refill:  1    Order Specific Question:  Supervising Provider    Answer:  Chipper Herb [[id number]]  . metoprolol (LOPRESSOR) 50 MG tablet    Sig: Take 1 tablet (50 mg total) by mouth 2 (two) times daily.    Dispense:  180 tablet    Refill:  1    Order Specific Question:  Supervising Provider    Answer:  Chipper Herb [[id number]]  . furosemide (LASIX) 20 MG tablet    Sig: Take 1 tablet (20 mg total) by mouth daily.    Dispense:  90 tablet     Refill:  1    Order Specific Question:  Supervising Provider    Answer:  Chipper Herb [[id number]]    Labs pending Health maintenance reviewed Diet and exercise encouraged Continue all meds Follow up  In 3 mnths    Mary-Margaret Hassell Done, FNP

## 2013-09-12 NOTE — Patient Instructions (Signed)

## 2013-09-12 NOTE — Progress Notes (Signed)
Subjective:    Patient ID: Julia West, female    DOB: 12/04/1955, 58 y.o.   MRN: [id number] Patient is here today for chronic follow up.   Hypertension This is a chronic problem. The current episode started more than 1 year ago. The problem has been waxing and waning since onset. The problem is controlled. Associated symptoms include anxiety and peripheral edema. Pertinent negatives include no blurred vision, chest pain, palpitations or shortness of breath. Risk factors for coronary artery disease include dyslipidemia, family history and post-menopausal state. Past treatments include diuretics and calcium channel blockers. The current treatment provides moderate improvement. Compliance problems include exercise.  There is no history of a thyroid problem.  Hyperlipidemia This is a chronic problem. The current episode started more than 1 year ago. The problem is uncontrolled. Recent lipid tests were reviewed and are high. Exacerbating diseases include obesity. She has no history of diabetes. Associated symptoms include leg pain and myalgias. Pertinent negatives include no chest pain or shortness of breath. Treatments tried: Pt was taking Crestor, but had joint pain and stopped four months ago. The current treatment provides no improvement of lipids. Compliance problems include medication side effects and medication cost.  Risk factors for coronary artery disease include post-menopausal, hypertension, obesity and dyslipidemia.  Anxiety Presents for follow-up visit. Symptoms include insomnia. Patient reports no chest pain, depressed mood, excessive worry, nervous/anxious behavior, palpitations, panic or shortness of breath. Symptoms occur occasionally. The quality of sleep is fair. Nighttime awakenings: occasional.   There is no history of anxiety/panic attacks, fibromyalgia or hyperthyroidism. Past treatments include benzodiazephines. The treatment provided significant relief. Compliance with prior  treatments has been good.  Insomnia Pt use to take ambien but never follow-up for refills-Has been taking xanax .25 at bedtime it works well. No complaints Peripheral Edema Pt takes lasix daily-Pt states it is working well-Edema decreased in lower extremities     Review of Systems  Eyes: Negative for blurred vision.  Respiratory: Negative for shortness of breath.   Cardiovascular: Negative for chest pain and palpitations.  Musculoskeletal: Positive for myalgias.  Psychiatric/Behavioral: The patient has insomnia. The patient is not nervous/anxious.        Objective:   Physical Exam  Constitutional: She is oriented to person, place, and time. She appears well-developed and well-nourished.  HENT:  Head: Normocephalic.  Right Ear: Hearing, tympanic membrane, external ear and ear canal normal.  Left Ear: Hearing, tympanic membrane, external ear and ear canal normal.  Nose: Nose normal.  Mouth/Throat: Uvula is midline and oropharynx is clear and moist.  Eyes: Conjunctivae and EOM are normal. Pupils are equal, round, and reactive to light.  Neck: Normal range of motion and full passive range of motion without pain. Neck supple. No JVD present. Carotid bruit is not present. No mass and no thyromegaly present.  Cardiovascular: Normal rate, normal heart sounds and intact distal pulses.   No murmur heard. Pulmonary/Chest: Effort normal and breath sounds normal. Right breast exhibits no inverted nipple, no mass, no nipple discharge, no skin change and no tenderness. Left breast exhibits no inverted nipple, no mass, no nipple discharge, no skin change and no tenderness.  Abdominal: Soft. Bowel sounds are normal. She exhibits no mass. There is no tenderness.  Genitourinary: Vagina normal and uterus normal. No breast swelling, tenderness, discharge or bleeding.  bimanual exam-No adnexal masses or tenderness. Cervix parous and pink- no discharge  Musculoskeletal: Normal range of motion.   Lymphadenopathy:    She has no cervical   adenopathy.  Neurological: She is alert and oriented to person, place, and time.  Skin: Skin is warm and dry.  Psychiatric: She has a normal mood and affect. Her behavior is normal. Judgment and thought content normal.    BP 130/62  Pulse 68  Temp(Src) 98.1 F (36.7 C) (Oral)  Ht 5' 7" (1.702 m)  Wt 341 lb (154.677 kg)  BMI 53.40 kg/m2       Assessment & Plan:   1. DYSLIPIDEMIA   2. HYPERTENSION   3. CAD   4. OBESITY, MORBID   5. Weight loss counseling, encounter for    Orders Placed This Encounter  Procedures  . CMP14+EGFR  . NMR, lipoprofile   Meds ordered this encounter  Medications  . DISCONTD: HYDROcodone-acetaminophen (NORCO/VICODIN) 5-325 MG per tablet    Sig: Take by mouth. Take 1 tablet 2-3 times daily as needed for pain  . HYDROcodone-acetaminophen (NORCO/VICODIN) 5-325 MG per tablet    Sig: Take 1 tablet by mouth every 6 (six) hours as needed for moderate pain. Take 1 tablet 2-3 times daily as needed for pain    Dispense:  30 tablet    Refill:  0    Order Specific Question:  Supervising Provider    Answer:  Chipper Herb [[id number]]  . valsartan-hydrochlorothiazide (DIOVAN-HCT) 160-12.5 MG per tablet    Sig: Take 1 tablet by mouth daily.    Dispense:  90 tablet    Refill:  3    Order Specific Question:  Supervising Provider    Answer:  Chipper Herb [[id number]]  . rosuvastatin (CRESTOR) 10 MG tablet    Sig: Take 1 tablet (10 mg total) by mouth at bedtime.    Dispense:  90 tablet    Refill:  1    Order Specific Question:  Supervising Provider    Answer:  Chipper Herb [[id number]]  . metoprolol (LOPRESSOR) 50 MG tablet    Sig: Take 1 tablet (50 mg total) by mouth 2 (two) times daily.    Dispense:  180 tablet    Refill:  1    Order Specific Question:  Supervising Provider    Answer:  Chipper Herb [[id number]]  . furosemide (LASIX) 20 MG tablet    Sig: Take 1 tablet (20 mg total) by mouth daily.    Dispense:  90 tablet     Refill:  1    Order Specific Question:  Supervising Provider    Answer:  Chipper Herb [[id number]]    Labs pending Health maintenance reviewed Diet and exercise encouraged Continue all meds Follow up  In 3 mnths    Mary-Margaret Hassell Done, FNP

## 2013-09-13 ENCOUNTER — Other Ambulatory Visit: Payer: Self-pay | Admitting: Nurse Practitioner

## 2013-09-13 LAB — CMP14+EGFR
ALK PHOS: 67 IU/L (ref 39–117)
ALT: 17 IU/L (ref 0–32)
AST: 16 IU/L (ref 0–40)
Albumin/Globulin Ratio: 1.5 (ref 1.1–2.5)
Albumin: 4.3 g/dL (ref 3.6–4.8)
BUN / CREAT RATIO: 16 (ref 11–26)
BUN: 18 mg/dL (ref 8–27)
CO2: 27 mmol/L (ref 18–29)
CREATININE: 1.14 mg/dL — AB (ref 0.57–1.00)
Calcium: 9.6 mg/dL (ref 8.7–10.3)
Chloride: 98 mmol/L (ref 97–108)
GFR calc Af Amer: 60 mL/min/{1.73_m2} (ref >59)
GFR calc non Af Amer: 52 mL/min/{1.73_m2} — ABNORMAL LOW (ref >59)
GLOBULIN, TOTAL: 2.8 g/dL (ref 1.5–4.5)
Glucose: 114 mg/dL — ABNORMAL HIGH (ref 65–99)
POTASSIUM: 4.6 mmol/L (ref 3.5–5.2)
SODIUM: 142 mmol/L (ref 134–144)
Total Bilirubin: 0.3 mg/dL (ref 0.0–1.2)
Total Protein: 7.1 g/dL (ref 6.0–8.5)

## 2013-09-13 LAB — NMR, LIPOPROFILE
Cholesterol: 199 mg/dL (ref 100–199)
HDL Cholesterol by NMR: 35 mg/dL — ABNORMAL LOW (ref >39)
HDL PARTICLE NUMBER: 27.1 umol/L — AB (ref >=30.5)
LDL Particle Number: 1541 nmol/L — ABNORMAL HIGH (ref <1000)
LDL Size: 20.6 nm (ref >20.5)
LDLC SERPL CALC-MCNC: 115 mg/dL — AB (ref 0–99)
LP-IR SCORE: 79 — AB (ref <=45)
SMALL LDL PARTICLE NUMBER: 776 nmol/L — AB (ref <=527)
Triglycerides by NMR: 247 mg/dL — ABNORMAL HIGH (ref 0–149)

## 2013-09-13 MED ORDER — FENOFIBRATE 145 MG PO TABS: 145.0000 mg | ORAL_TABLET | Freq: Every day | ORAL | Status: DC

## 2013-09-13 NOTE — Telephone Encounter (Signed)
NTBS to discuss 

## 2013-09-13 NOTE — Telephone Encounter (Signed)
Patient aware.

## 2013-10-22 ENCOUNTER — Other Ambulatory Visit: Payer: Self-pay | Admitting: Nurse Practitioner

## 2013-10-22 NOTE — Telephone Encounter (Signed)
Please call in ambien with 1 refills 

## 2013-10-22 NOTE — Telephone Encounter (Signed)
Last seen 09/12/13  MMM   If approved route to nurse to call into [facility]

## 2013-10-23 NOTE — Telephone Encounter (Signed)
Called in.

## 2013-10-31 ENCOUNTER — Encounter: Payer: Self-pay | Admitting: Cardiology

## 2013-10-31 ENCOUNTER — Ambulatory Visit (INDEPENDENT_AMBULATORY_CARE_PROVIDER_SITE_OTHER): Payer: Medicare Other | Admitting: Cardiology

## 2013-10-31 VITALS — BP 149/72 | HR 62 | Ht 67.0 in | Wt 342.0 lb

## 2013-10-31 DIAGNOSIS — I2510 Atherosclerotic heart disease of native coronary artery without angina pectoris: Secondary | ICD-10-CM

## 2013-10-31 DIAGNOSIS — I10 Essential (primary) hypertension: Secondary | ICD-10-CM

## 2013-10-31 NOTE — Patient Instructions (Signed)
The current medical regimen is effective;  continue present plan and medications.  Follow up in 1 year with Dr Skains.  You will receive a letter in the mail 2 months before you are due.  Please call us when you receive this letter to schedule your follow up appointment.  

## 2013-10-31 NOTE — Progress Notes (Signed)
HPI The patient presents for follow of CAD.  She has stress perfusion study in 2013 it was low risk demonstrating an potential small distal inferolateral ischemic defect. However, this was managed medically.  She was cleared for umbilical hernia repair at that time. However, she ended having emergent surgery and she's had multiple surgeries after that for repair of an incarcerated bowel and then subsequent dehiscence of the wound. With all of this she denies any cardiovascular pains. She's had no chest pressure, neck or arm discomfort. She's had no new shortness of breath, PND or orthopnea. She's actually exercising in a pool now.  Allergies  Allergen Reactions  . Ace Inhibitors Cough    Current Outpatient Prescriptions  Medication Sig Dispense Refill  . aspirin 81 MG tablet Take 81 mg by mouth daily.       . fenofibrate (TRICOR) 145 MG tablet Take 1 tablet (145 mg total) by mouth daily.  30 tablet  5  . fish oil-omega-3 fatty acids 1000 MG capsule Take 2 g by mouth daily.        . furosemide (LASIX) 20 MG tablet Take 1 tablet (20 mg total) by mouth daily.  90 tablet  1  . ibuprofen (ADVIL,MOTRIN) 200 MG tablet Take 600 mg by mouth daily. Pain      . metoprolol (LOPRESSOR) 50 MG tablet Take 1 tablet (50 mg total) by mouth 2 (two) times daily.  180 tablet  1  . rosuvastatin (CRESTOR) 10 MG tablet Take 1 tablet (10 mg total) by mouth at bedtime.  90 tablet  1  . valsartan-hydrochlorothiazide (DIOVAN-HCT) 160-12.5 MG per tablet Take 1 tablet by mouth daily.  90 tablet  3  . zolpidem (AMBIEN) 10 MG tablet TAKE 1 TABLET BY MOUTH AT BEDTIME  30 tablet  1  . HYDROcodone-acetaminophen (NORCO/VICODIN) 5-325 MG per tablet Take 1 tablet by mouth every 6 (six) hours as needed for moderate pain. Take 1 tablet 2-3 times daily as needed for pain  30 tablet  0   No current facility-administered medications for this visit.    Past Medical History  Diagnosis Date  . Morbid obesity   . Other and  unspecified hyperlipidemia   . Unspecified essential hypertension   . Acute myocardial infarction     with a ruptured plaque in the circumflex in 2003  . Degenerative joint disease   . Internal and external hemorrhoids without complication   . Anxiety   . Coronary artery disease   . Complication of anesthesia     states low O2 sats post-op 11/13  . Surgical wound, non healing ABDOMINAL     has wound vac @ 125 mm Hg    Past Surgical History  Procedure Laterality Date  . Cesarean section      x 4 in remote past  . Cholecystectomy    . Coronary artery bypass graft  2003    by Dr. Servando Snare. LIMA to the LAD, free RIMA to the circumflex. Stress perfusion study December 2009 with no high-risk areas of ischemia. She has a well-preserved ejection fraction  . Incisional hernia repair  02/07/2012    Procedure: HERNIA REPAIR INCISIONAL;  Surgeon: Gayland Curry, MD,FACS;  Location: [place];  Service: General;;  Open, Primary repair, strangulated Incisional hernia.  . Bowel resection  02/07/2012    Procedure: SMALL BOWEL RESECTION;  Surgeon: Gayland Curry, MD,FACS;  Location: [place];  Service: General;;  . Application of wound vac    . Incision  and drainage of wound  04/17/2012    Procedure: IRRIGATION AND DEBRIDEMENT WOUND;  Surgeon: Theodoro Kos, DO;  Location: [place];  Service: Plastics;  Laterality: N/A;  OF ABDOMINAL WOUND, SURGICAL PREP AND PLACEMENT OF VAC, REMOVAL FOREHEAD SKIN LESION  . Lesion removal  04/17/2012    Procedure: LESION REMOVAL;  Surgeon: Theodoro Kos, DO;  Location: [place];  Service: Plastics;  Laterality: N/A;   CENTER OF FOREHEAD, REMOVAL FORHEAD SKIN LESION  . Incision and drainage of wound N/A 04/24/2012    Procedure: IRRIGATION AND DEBRIDEMENT WOUND;  Surgeon: Theodoro Kos, DO;  Location: [place];  Service: Plastics;  Laterality: N/A;  I & D ABDOMINAL WOUND WITH VAC AND ACELL  . Incision and drainage of wound N/A  05/01/2012    Procedure: IRRIGATION AND DEBRIDEMENT WOUND;  Surgeon: Theodoro Kos, DO;  Location: [place];  Service: Plastics;  Laterality: N/A;  WITH SURGICAL PREP AND PLACEMENT OF VAC  . Incision and drainage of wound N/A 05/08/2012    Procedure: IRRIGATION AND DEBRIDEMENT OF ABD WOUND SURGICAL PREP AND PLACEMENT OF VAC ;  Surgeon: Theodoro Kos, DO;  Location: [place];  Service: Plastics;  Laterality: N/A;  IRRIGATION AND DEBRIDEMENT OF ABD WOUND SURGICAL PREP AND PLACEMENT OF VAC   . Cardiovascular stress test  01-17-2012  DR Oyindamola Key    LOW RISK NUCLEAR TEST  . Incision and drainage of wound N/A 05/15/2012    Procedure: IRRIGATION AND DEBRIDEMENT OF ABDOMINAL WOUND WITH POSSIBLE SURGICAL PREP AND PLACEMENT OF VAC;  Surgeon: Theodoro Kos, DO;  Location: [place];  Service: Plastics;  Laterality: N/A;  . Incision and drainage of wound N/A 05/22/2012    Procedure: IRRIGATION AND DEBRIDEMENT OF ABODOMINAL WOUND WITH  SURGICAL PREP AND VAC PLACEMENT;  Surgeon: Theodoro Kos, DO;  Location: [place];  Service: Plastics;  Laterality: N/A;  . Incision and drainage of wound N/A 06/28/2012    Procedure: IRRIGATION AND DEBRIDEMENT OF ABDOMINAL ULCER SURGICAL PREP AND PLACEMENT OF ACELL AND VAC;  Surgeon: Theodoro Kos, DO;  Location: [place];  Service: Plastics;  Laterality: N/A;    ROS:  As stated in the HPI and negative for all other systems.  PHYSICAL EXAM BP 149/72  Pulse 62  Ht 5\' 7"  (1.702 m)  Wt 342 lb (155.13 kg)  BMI 53.55 kg/m2 GENERAL:  Well appearing HEENT:  Pupils equal round and reactive, fundi not visualized, oral mucosa unremarkable NECK:  No jugular venous distention, waveform within normal limits, carotid upstroke brisk and symmetric, no bruits, no thyromegaly LYMPHATICS:  No cervical, inguinal adenopathy LUNGS:  Clear to auscultation bilaterally BACK:  No CVA tenderness CHEST:  Well  healed sternotomy scar. HEART:  PMI not displaced or sustained,S1 and S2 within normal limits, no S3, no S4, no clicks, no rubs, no murmurs ABD:  Flat, positive bowel sounds normal in frequency in pitch, no bruits, no rebound, no guarding, no midline pulsatile mass, no hepatomegaly, no splenomegaly, obese EXT:  2 plus pulses throughout, no edema, no cyanosis no clubbing SKIN:  No rashes no nodules NEURO:  Cranial nerves II through XII grossly intact, motor grossly intact throughout PSYCH:  Cognitively intact, oriented to person place and time  EKG:  Sinus rhythm, rate 65, left axis deviation RSR V1 and V2, no acute ST-T wave changes.  10/31/2013  ASSESSMENT AND PLAN  CAD - The patient has no new sypmtoms.  No  further cardiovascular testing is indicated.  We will continue with aggressive risk reduction and meds as listed.  HYPERTENSION -  Although the blood pressure is slightly elevated today she says this is quite unusual.. No change in medications is indicated. We will continue with therapeutic lifestyle changes (TLC).   DYSLIPIDEMIA -  Her LDL is slightly elevated at 115. This is slightly above target with her increased activity we will see if this comes down. She will remain on the meds as listed.

## 2013-12-18 ENCOUNTER — Other Ambulatory Visit: Payer: Self-pay | Admitting: Nurse Practitioner

## 2013-12-20 NOTE — Telephone Encounter (Signed)
Patient last seen in office on 09-12-13. Rx last filled on 11-24-13 for #30. Please advise. If approved please route to Pool B so nurse can phone in to pharmacy

## 2013-12-20 NOTE — Telephone Encounter (Signed)
Please call in ambien  with 0 refills 

## 2013-12-21 NOTE — Telephone Encounter (Signed)
Called to pharmacy 

## 2014-01-01 ENCOUNTER — Ambulatory Visit (INDEPENDENT_AMBULATORY_CARE_PROVIDER_SITE_OTHER): Payer: Medicare Other | Admitting: Family Medicine

## 2014-01-01 ENCOUNTER — Encounter: Payer: Self-pay | Admitting: Family Medicine

## 2014-01-01 VITALS — BP 123/69 | HR 80 | Temp 98.2°F | Ht 67.0 in | Wt 343.0 lb

## 2014-01-01 DIAGNOSIS — J069 Acute upper respiratory infection, unspecified: Secondary | ICD-10-CM

## 2014-01-01 DIAGNOSIS — M25561 Pain in right knee: Secondary | ICD-10-CM

## 2014-01-01 MED ORDER — HYDROCODONE-ACETAMINOPHEN 5-325 MG PO TABS: 1.0000 | ORAL_TABLET | Freq: Four times a day (QID) | ORAL | Status: DC | PRN

## 2014-01-01 MED ORDER — AZITHROMYCIN 250 MG PO TABS: ORAL_TABLET | ORAL | Status: DC

## 2014-01-01 NOTE — Progress Notes (Signed)
   Subjective:    Patient ID: Julia West, female    DOB: 11/01/1955, 58 y.o.   MRN: [id number]  HPI C/o right ear pressure and right knee pain discomfort.  She is c/o uri sx's   Review of Systems No chest pain, SOB, HA, dizziness, vision change, N/V, diarrhea, constipation, dysuria, urinary urgency or frequency, myalgias, arthralgias or rash.     Objective:   Physical Exam  Vital signs noted  Well developed well nourished female.  HEENT - Head atraumatic Normocephalic                Eyes - PERRLA, Conjuctiva - clear Sclera- Clear EOMI                Ears - EAC's Wnl TM's Wnl Gross Hearing WNL                Nose - Nares patent                 Throat - oropharanx wnl Respiratory - Lungs CTA bilateral Cardiac - RRR S1 and S2 without murmur GI - Abdomen soft Nontender and bowel sounds active x 4 Extremities - No edema. Neuro - Grossly intact.      Assessment & Plan:  Right knee pain - Plan: HYDROcodone-acetaminophen (NORCO/VICODIN) 5-325 MG per tablet  URI (upper respiratory infection) - Plan: azithromycin (ZITHROMAX) 250 MG tablet  Lysbeth Penner FNP

## 2014-01-21 ENCOUNTER — Other Ambulatory Visit: Payer: Self-pay | Admitting: Nurse Practitioner

## 2014-01-22 NOTE — Telephone Encounter (Signed)
Last ov 01/01/14. Route to nurse pool. If approved call to [facility]. Last refill 12/21/13.

## 2014-01-22 NOTE — Telephone Encounter (Signed)
[facility]

## 2014-01-25 ENCOUNTER — Ambulatory Visit: Payer: Medicare Other | Admitting: Obstetrics and Gynecology

## 2014-02-04 ENCOUNTER — Encounter: Payer: Self-pay | Admitting: Family Medicine

## 2014-02-04 ENCOUNTER — Ambulatory Visit (INDEPENDENT_AMBULATORY_CARE_PROVIDER_SITE_OTHER): Payer: Medicare Other | Admitting: Family Medicine

## 2014-02-04 VITALS — BP 155/62 | HR 54 | Temp 97.0°F | Ht 67.0 in | Wt 335.8 lb

## 2014-02-04 DIAGNOSIS — H65191 Other acute nonsuppurative otitis media, right ear: Secondary | ICD-10-CM

## 2014-02-04 DIAGNOSIS — B009 Herpesviral infection, unspecified: Secondary | ICD-10-CM

## 2014-02-04 MED ORDER — HYDROCODONE-ACETAMINOPHEN 10-325 MG PO TABS: 1.0000 | ORAL_TABLET | Freq: Three times a day (TID) | ORAL | Status: DC | PRN

## 2014-02-04 MED ORDER — AMOXICILLIN 875 MG PO TABS: 875.0000 mg | ORAL_TABLET | Freq: Two times a day (BID) | ORAL | Status: DC

## 2014-02-04 MED ORDER — VALACYCLOVIR HCL 1 G PO TABS: 1000.0000 mg | ORAL_TABLET | Freq: Two times a day (BID) | ORAL | Status: DC

## 2014-02-04 NOTE — Progress Notes (Signed)
   Subjective:    Patient ID: Julia West, female    DOB: 28-Oct-1955, 58 y.o.   MRN: [id number]  HPI 58 year old female with pain and a rash on her right upper buttock. There was some blistering initially but the blisters have now ruptured and there is just some erythema. He does not have a true dermatomal distribution so I think it is not likely to be zoster.  She also some complains of pain and fullness in her right ear. The there is popping. She does not have any sinus pain pressure or postnasal drainage    Review of Systems  Constitutional: Negative.   HENT: Positive for ear pain.   Eyes: Negative.   Respiratory: Negative.   Cardiovascular: Negative.   Gastrointestinal: Negative.   Endocrine: Negative.   Genitourinary: Negative.   Musculoskeletal: Positive for myalgias.       Bilateral thumb pain  Skin: Positive for rash.  Hematological: Negative.   Psychiatric/Behavioral: Negative.        Objective:   Physical Exam  Constitutional: She is oriented to person, place, and time. She appears well-developed and well-nourished.  HENT:  Right TM is red with increased vascular markings. TM is a little immobile with Valsalva maneuver  Eyes: Conjunctivae and EOM are normal.  Neck: Normal range of motion. Neck supple.  Cardiovascular: Normal rate, regular rhythm and normal heart sounds.   Pulmonary/Chest: Effort normal and breath sounds normal.  Abdominal: Soft. Bowel sounds are normal.  Musculoskeletal: Normal range of motion.  Neurological: She is alert and oriented to person, place, and time. She has normal reflexes.  Skin: Skin is warm and dry.  Area of erythema and old blister on right upper buttock suspicious for herpes simplex  Psychiatric: She has a normal mood and affect. Her behavior is normal. Thought content normal.          Assessment & Plan:  1. Acute nonsuppurative otitis media of right ear Amoxicillin 875 mg twice a day, ear exercises as discussed with  patient, and continued use of Flonase  2. Herpes simplex Discussed simplex versus zoster plan Valtrex 1000 mg twice a day for 1 week  Wardell Honour MD

## 2014-02-04 NOTE — Patient Instructions (Signed)
Herpes Simplex Herpes simplex is generally classified as Type 1 or Type 2. Type 1 is generally the type that is responsible for cold sores. Type 2 is generally associated with sexually transmitted diseases. We now know that most of the thoughts on these viruses are inaccurate. We find that HSV1 is also present genitally and HSV2 can be present orally, but this will vary in different locations of the world. Herpes simplex is usually detected by doing a culture. Blood tests are also available for this virus; however, the accuracy is often not as good.  PREPARATION FOR TEST No preparation or fasting is necessary. NORMAL FINDINGS  No virus present  No HSV antigens or antibodies present Ranges for normal findings may vary among different laboratories and hospitals. You should always check with your doctor after having lab work or other tests done to discuss the meaning of your test results and whether your values are considered within normal limits. MEANING OF TEST  Your caregiver will go over the test results with you and discuss the importance and meaning of your results, as well as treatment options and the need for additional tests if necessary. OBTAINING THE TEST RESULTS  It is your responsibility to obtain your test results. Ask the lab or department performing the test when and how you will get your results. Document Released: 04/03/2004 Document Revised: 05/24/2011 Document Reviewed: 02/10/2008 Exit[employer] Patient Information 2015 [employer]. This information is not intended to replace advice given to you by your health care provider. Make sure you discuss any questions you have with your health care provider.  

## 2014-03-21 ENCOUNTER — Other Ambulatory Visit: Payer: Medicare Other | Admitting: Obstetrics and Gynecology

## 2014-03-22 ENCOUNTER — Other Ambulatory Visit: Payer: Medicare Other | Admitting: Obstetrics and Gynecology

## 2014-03-25 ENCOUNTER — Encounter: Payer: Self-pay | Admitting: Obstetrics and Gynecology

## 2014-03-25 ENCOUNTER — Other Ambulatory Visit (HOSPITAL_COMMUNITY)
Admission: RE | Admit: 2014-03-25 | Discharge: 2014-03-25 | Disposition: A | Discharge status: Home / Self Care | Payer: Medicare Other | Source: Ambulatory Visit | Attending: Obstetrics and Gynecology | Admitting: Obstetrics and Gynecology

## 2014-03-25 ENCOUNTER — Ambulatory Visit (INDEPENDENT_AMBULATORY_CARE_PROVIDER_SITE_OTHER): Payer: Medicare Other | Admitting: Obstetrics and Gynecology

## 2014-03-25 VITALS — BP 118/60 | Ht 66.0 in | Wt 344.0 lb

## 2014-03-25 DIAGNOSIS — Z1151 Encounter for screening for human papillomavirus (HPV): Secondary | ICD-10-CM | POA: Insufficient documentation

## 2014-03-25 DIAGNOSIS — L680 Hirsutism: Secondary | ICD-10-CM

## 2014-03-25 DIAGNOSIS — Z01419 Encounter for gynecological examination (general) (routine) without abnormal findings: Secondary | ICD-10-CM | POA: Diagnosis not present

## 2014-03-25 DIAGNOSIS — Z124 Encounter for screening for malignant neoplasm of cervix: Secondary | ICD-10-CM | POA: Insufficient documentation

## 2014-03-25 LAB — FECAL OCCULT BLOOD, GUAIAC: Fecal Occult Blood: NEGATIVE

## 2014-03-25 MED ORDER — CLOBETASOL PROPIONATE 0.05 % EX CREA: 1.0000 "application " | TOPICAL_CREAM | Freq: Two times a day (BID) | CUTANEOUS | Status: DC

## 2014-03-25 NOTE — Addendum Note (Signed)
Addended by: Farley Ly on: 03/25/2014 01:20 PM   Modules accepted: Orders

## 2014-03-25 NOTE — Progress Notes (Signed)
Patient ID: Julia West, female   DOB: 1955-04-12, 59 y.o.   MRN: [id number] Pt here today for her annual exam. Pt states that she would like to discuss with Dr. Glo Herring, the bleeding she has been experiencing. The pt states that she went from August of 2013 to October of 2015 with no period then started in October 2015 and bled for almost 2 weeks and now has bleeding when ever "it" wants to. Very irregular bleeding. Pt states that she can't sleep she has excess hair growth of her face, Pt states that she keeps an itchy feeling on the right side of her vagina on the outer side of the vagina.

## 2014-03-25 NOTE — Progress Notes (Signed)
Patient ID: Julia West, female   DOB: [birth year], 59 y.o.   MRN: [id number]  Assessment:  Annual Gyn Exam Postmenopausal bleeding Morbid obesity S/p resection of incarcerated bowel with postop wound infection and 10 f/u wound care incisions Vulvar prurutis.   Plan:  1. pap smear done, next pap due 3 years 2. return annually or prn 3    Annual mammogram advised 4.   Follow-up for u/s and subsequent endometrial biopsy Subjective:  Julia West is a 59 y.o. female No obstetric history on file. who presents for annual exam. No LMP recorded. Patient is postmenopausal. The patient has complaints today of intermittent vaginal bleeding that started three months ago.  She states that she had not experienced vaginal bleeding for a year and a half prior to this episode.  She does not have a history of abnormal pap smears or hysterectomy.  She had emergency surgery in November 2014 due to an incarcerated hernia.  She was unable to heal from the surgery and was on a vacuum pump for 9 months and underwent 9-10 abdominal surgeries.  She has not had a colonoscopy recently.   Labial and vulvar pruritis on right side (pt with right leg lymphedema >>left)  The following portions of the patient's history were reviewed and updated as appropriate: allergies, current medications, past family history, past medical history, past social history, past surgical history and problem list. Past Medical History  Diagnosis Date  . Morbid obesity   . Other and unspecified hyperlipidemia   . Unspecified essential hypertension   . Acute myocardial infarction     with a ruptured plaque in the circumflex in 2003  . Degenerative joint disease   . Internal and external hemorrhoids without complication   . Anxiety   . Coronary artery disease   . Complication of anesthesia     states low O2 sats post-op 11/13  . Surgical wound, non healing ABDOMINAL     has wound vac @ 125 mm Hg    Past Surgical History   Procedure Laterality Date  . Cesarean section      x 4 in remote past  . Cholecystectomy    . Coronary artery bypass graft  2003    by Dr. Servando Snare. LIMA to the LAD, free RIMA to the circumflex. Stress perfusion study December 2009 with no high-risk areas of ischemia. She has a well-preserved ejection fraction  . Incisional hernia repair  02/07/2012    Procedure: HERNIA REPAIR INCISIONAL;  Surgeon: Gayland Curry, MD,FACS;  Location: [place];  Service: General;;  Open, Primary repair, strangulated Incisional hernia.  . Bowel resection  02/07/2012    Procedure: SMALL BOWEL RESECTION;  Surgeon: Gayland Curry, MD,FACS;  Location: [place];  Service: General;;  . Application of wound vac    . Incision and drainage of wound  04/17/2012    Procedure: IRRIGATION AND DEBRIDEMENT WOUND;  Surgeon: Theodoro Kos, DO;  Location: [place];  Service: Plastics;  Laterality: N/A;  OF ABDOMINAL WOUND, SURGICAL PREP AND PLACEMENT OF VAC, REMOVAL FOREHEAD SKIN LESION  . Lesion removal  04/17/2012    Procedure: LESION REMOVAL;  Surgeon: Theodoro Kos, DO;  Location: [place];  Service: Plastics;  Laterality: N/A;   CENTER OF FOREHEAD, REMOVAL FORHEAD SKIN LESION  . Incision and drainage of wound N/A 04/24/2012    Procedure: IRRIGATION AND DEBRIDEMENT WOUND;  Surgeon: Theodoro Kos, DO;  Location: [place];  Service: Plastics;  Laterality: N/A;  I & D ABDOMINAL WOUND WITH VAC AND ACELL  . Incision and drainage of wound N/A 05/01/2012    Procedure: IRRIGATION AND DEBRIDEMENT WOUND;  Surgeon: Theodoro Kos, DO;  Location: [place];  Service: Plastics;  Laterality: N/A;  WITH SURGICAL PREP AND PLACEMENT OF VAC  . Incision and drainage of wound N/A 05/08/2012    Procedure: IRRIGATION AND DEBRIDEMENT OF ABD WOUND SURGICAL PREP AND PLACEMENT OF VAC ;  Surgeon: Theodoro Kos, DO;  Location: [place];  Service: Plastics;  Laterality: N/A;   IRRIGATION AND DEBRIDEMENT OF ABD WOUND SURGICAL PREP AND PLACEMENT OF VAC   . Cardiovascular stress test  01-17-2012  DR HOCHREIN    LOW RISK NUCLEAR TEST  . Incision and drainage of wound N/A 05/15/2012    Procedure: IRRIGATION AND DEBRIDEMENT OF ABDOMINAL WOUND WITH POSSIBLE SURGICAL PREP AND PLACEMENT OF VAC;  Surgeon: Theodoro Kos, DO;  Location: [place];  Service: Plastics;  Laterality: N/A;  . Incision and drainage of wound N/A 05/22/2012    Procedure: IRRIGATION AND DEBRIDEMENT OF ABODOMINAL WOUND WITH  SURGICAL PREP AND VAC PLACEMENT;  Surgeon: Theodoro Kos, DO;  Location: [place];  Service: Plastics;  Laterality: N/A;  . Incision and drainage of wound N/A 06/28/2012    Procedure: IRRIGATION AND DEBRIDEMENT OF ABDOMINAL ULCER SURGICAL PREP AND PLACEMENT OF ACELL AND VAC;  Surgeon: Theodoro Kos, DO;  Location: [place];  Service: Plastics;  Laterality: N/A;    Current outpatient prescriptions: aspirin 81 MG tablet, Take 81 mg by mouth daily. , Disp: , Rfl: ;  fish oil-omega-3 fatty acids 1000 MG capsule, Take 2 g by mouth daily.  , Disp: , Rfl: ;  furosemide (LASIX) 20 MG tablet, Take 1 tablet (20 mg total) by mouth daily., Disp: 90 tablet, Rfl: 1;  ibuprofen (ADVIL,MOTRIN) 200 MG tablet, Take 600 mg by mouth daily. Pain, Disp: , Rfl:  metoprolol (LOPRESSOR) 50 MG tablet, Take 1 tablet (50 mg total) by mouth 2 (two) times daily., Disp: 180 tablet, Rfl: 1;  Multiple Vitamin (MULTIVITAMIN) tablet, Take 1 tablet by mouth daily., Disp: , Rfl: ;  valsartan-hydrochlorothiazide (DIOVAN-HCT) 160-12.5 MG per tablet, Take 1 tablet by mouth daily., Disp: 90 tablet, Rfl: 3  Review of Systems Constitutional: negative Gastrointestinal: negative Genitourinary: negative  Objective:  BP 118/60 mmHg  Ht 5\' 6"  (1.676 m)  Wt 344 lb (156.037 kg)  BMI 55.55 kg/m2   BMI: Body mass index is 55.55 kg/(m^2).  General Appearance: Alert, appropriate  appearance for age. No acute distress HEENT: Grossly normal Neck / Thyroid:  Cardiovascular: RRR; normal S1, S2, no murmur Lungs: CTA bilaterally Back: No CVAT Breast Exam: No dimpling, nipple retraction or discharge. No masses or nodes. Gastrointestinal: Soft, non-tender, no masses or organomegaly Pelvic Exam: External genitalia: normal general appearance Vaginal: normal mucosa without prolapse or lesions and normal without tenderness, induration or masses, good support, vaginal tissues are smooth with no atrophy Cervix: normal appearance Adnexa: normal bimanual exam, nontender, no masses Uterus: normal single, nontender Rectovaginal: normal rectal, no masses Lymphatic Exam: Non-palpable nodes in neck, clavicular, axillary, or inguinal regions Skin: no rash or abnormalities Neurologic: Normal gait and speech, no tremor  Psychiatric: Alert and oriented, appropriate affect.  Urinalysis:Not done Hemoccult: Negative  Mallory Shirk. MD Pgr [phone] 11:43 AM  This chart was scribed for Jonnie Kind, MD by Donato Schultz, ED Scribe. This patient was seen in Room 2 and the patient's care  was started at 11:43 AM.

## 2014-03-26 LAB — TESTOSTERONE: Testosterone: 61 ng/dL (ref 10–70)

## 2014-03-26 LAB — CYTOLOGY - PAP

## 2014-03-28 ENCOUNTER — Telehealth: Payer: Self-pay | Admitting: Obstetrics and Gynecology

## 2014-03-28 NOTE — Telephone Encounter (Signed)
Pt aware of results and stated that she would discuss the blood with Dr. Glo Herring at her next visit.

## 2014-04-03 ENCOUNTER — Other Ambulatory Visit: Payer: Self-pay | Admitting: Obstetrics and Gynecology

## 2014-04-03 DIAGNOSIS — N950 Postmenopausal bleeding: Secondary | ICD-10-CM

## 2014-04-08 ENCOUNTER — Ambulatory Visit (INDEPENDENT_AMBULATORY_CARE_PROVIDER_SITE_OTHER): Payer: Medicare Other

## 2014-04-08 ENCOUNTER — Ambulatory Visit (INDEPENDENT_AMBULATORY_CARE_PROVIDER_SITE_OTHER): Payer: Medicare Other | Admitting: Obstetrics and Gynecology

## 2014-04-08 ENCOUNTER — Encounter: Payer: Self-pay | Admitting: Obstetrics and Gynecology

## 2014-04-08 VITALS — BP 124/70 | Ht 67.0 in

## 2014-04-08 DIAGNOSIS — R938 Abnormal findings on diagnostic imaging of other specified body structures: Secondary | ICD-10-CM | POA: Diagnosis not present

## 2014-04-08 DIAGNOSIS — R9389 Abnormal findings on diagnostic imaging of other specified body structures: Secondary | ICD-10-CM

## 2014-04-08 DIAGNOSIS — N950 Postmenopausal bleeding: Secondary | ICD-10-CM

## 2014-04-09 DIAGNOSIS — N840 Polyp of corpus uteri: Secondary | ICD-10-CM | POA: Insufficient documentation

## 2014-04-09 NOTE — Progress Notes (Signed)
Patient ID: Julia West, female   DOB: Aug 04, 1955, 59 y.o.   MRN: [id number]   [facility] Visit  Patient name: Julia West MRN [id number]  Date of birth: [birth year]  CC & HPI:  Julia West is a 59 y.o. female presenting today for followup of u/s  Additionally the pt has not bought clobetasol for vulvar discomfort due to cost  ROS:  Pt reports that she had an endometrial biopsy in 2011, but review of EPIC shows NO evidence to confirm this.  Pertinent History Reviewed:   Reviewed: Significant for obesity. Postmenopausal status. Medical         Past Medical History  Diagnosis Date  . Morbid obesity   . Other and unspecified hyperlipidemia   . Unspecified essential hypertension   . Acute myocardial infarction     with a ruptured plaque in the circumflex in 2003  . Degenerative joint disease   . Internal and external hemorrhoids without complication   . Anxiety   . Coronary artery disease   . Complication of anesthesia     states low O2 sats post-op 11/13  . Surgical wound, non healing ABDOMINAL     has wound vac @ 125 mm Hg                              Surgical Hx:    Past Surgical History  Procedure Laterality Date  . Cesarean section      x 4 in remote past  . Cholecystectomy    . Coronary artery bypass graft  2003    by Dr. Servando Snare. LIMA to the LAD, free RIMA to the circumflex. Stress perfusion study December 2009 with no high-risk areas of ischemia. She has a well-preserved ejection fraction  . Incisional hernia repair  02/07/2012    Procedure: HERNIA REPAIR INCISIONAL;  Surgeon: Gayland Curry, MD,FACS;  Location: [place];  Service: General;;  Open, Primary repair, strangulated Incisional hernia.  . Bowel resection  02/07/2012    Procedure: SMALL BOWEL RESECTION;  Surgeon: Gayland Curry, MD,FACS;  Location: [place];  Service: General;;  . Application of wound vac    . Incision and drainage of wound  04/17/2012    Procedure: IRRIGATION AND  DEBRIDEMENT WOUND;  Surgeon: Theodoro Kos, DO;  Location: [place];  Service: Plastics;  Laterality: N/A;  OF ABDOMINAL WOUND, SURGICAL PREP AND PLACEMENT OF VAC, REMOVAL FOREHEAD SKIN LESION  . Lesion removal  04/17/2012    Procedure: LESION REMOVAL;  Surgeon: Theodoro Kos, DO;  Location: [place];  Service: Plastics;  Laterality: N/A;   CENTER OF FOREHEAD, REMOVAL FORHEAD SKIN LESION  . Incision and drainage of wound N/A 04/24/2012    Procedure: IRRIGATION AND DEBRIDEMENT WOUND;  Surgeon: Theodoro Kos, DO;  Location: [place];  Service: Plastics;  Laterality: N/A;  I & D ABDOMINAL WOUND WITH VAC AND ACELL  . Incision and drainage of wound N/A 05/01/2012    Procedure: IRRIGATION AND DEBRIDEMENT WOUND;  Surgeon: Theodoro Kos, DO;  Location: [place];  Service: Plastics;  Laterality: N/A;  WITH SURGICAL PREP AND PLACEMENT OF VAC  . Incision and drainage of wound N/A 05/08/2012    Procedure: IRRIGATION AND DEBRIDEMENT OF ABD WOUND SURGICAL PREP AND PLACEMENT OF VAC ;  Surgeon: Theodoro Kos, DO;  Location: [place];  Service: Plastics;  Laterality: N/A;  IRRIGATION AND  DEBRIDEMENT OF ABD WOUND SURGICAL PREP AND PLACEMENT OF VAC   . Cardiovascular stress test  01-17-2012  DR HOCHREIN    LOW RISK NUCLEAR TEST  . Incision and drainage of wound N/A 05/15/2012    Procedure: IRRIGATION AND DEBRIDEMENT OF ABDOMINAL WOUND WITH POSSIBLE SURGICAL PREP AND PLACEMENT OF VAC;  Surgeon: Theodoro Kos, DO;  Location: [place];  Service: Plastics;  Laterality: N/A;  . Incision and drainage of wound N/A 05/22/2012    Procedure: IRRIGATION AND DEBRIDEMENT OF ABODOMINAL WOUND WITH  SURGICAL PREP AND VAC PLACEMENT;  Surgeon: Theodoro Kos, DO;  Location: [place];  Service: Plastics;  Laterality: N/A;  . Incision and drainage of wound N/A 06/28/2012    Procedure: IRRIGATION AND DEBRIDEMENT OF ABDOMINAL  ULCER SURGICAL PREP AND PLACEMENT OF ACELL AND VAC;  Surgeon: Theodoro Kos, DO;  Location: [place];  Service: Plastics;  Laterality: N/A;   Medications: Reviewed & Updated - see associated section                       Current outpatient prescriptions:  .  aspirin 81 MG tablet, Take 81 mg by mouth daily. , Disp: , Rfl:  .  clobetasol cream (TEMOVATE) 3.50 %, Apply 1 application topically 2 (two) times daily., Disp: 30 g, Rfl: 0 .  fish oil-omega-3 fatty acids 1000 MG capsule, Take 2 g by mouth daily.  , Disp: , Rfl:  .  furosemide (LASIX) 20 MG tablet, Take 1 tablet (20 mg total) by mouth daily., Disp: 90 tablet, Rfl: 1 .  ibuprofen (ADVIL,MOTRIN) 200 MG tablet, Take 600 mg by mouth daily. Pain, Disp: , Rfl:  .  metoprolol (LOPRESSOR) 50 MG tablet, Take 1 tablet (50 mg total) by mouth 2 (two) times daily., Disp: 180 tablet, Rfl: 1 .  Multiple Vitamin (MULTIVITAMIN) tablet, Take 1 tablet by mouth daily., Disp: , Rfl:  .  valsartan-hydrochlorothiazide (DIOVAN-HCT) 160-12.5 MG per tablet, Take 1 tablet by mouth daily., Disp: 90 tablet, Rfl: 3   Social History: Reviewed -  reports that she quit smoking about 2 years ago. Her smoking use included Cigarettes. She has a 25 pack-year smoking history. She has never used smokeless tobacco.  Objective Findings:  Vitals: Blood pressure 124/70, height 5\' 7"  (1.702 m).  Physical Examination: talk only review of records   Assessment & Plan:   A:  1. Endometrial thickening 18  Mm   P:  1. Endometrial biopsy 1 wk.

## 2014-04-15 ENCOUNTER — Other Ambulatory Visit: Payer: Self-pay | Admitting: Obstetrics and Gynecology

## 2014-04-15 ENCOUNTER — Ambulatory Visit (INDEPENDENT_AMBULATORY_CARE_PROVIDER_SITE_OTHER): Payer: Medicare Other | Admitting: Obstetrics and Gynecology

## 2014-04-15 VITALS — BP 120/60 | Ht 67.0 in | Wt 349.0 lb

## 2014-04-15 DIAGNOSIS — N840 Polyp of corpus uteri: Secondary | ICD-10-CM | POA: Diagnosis not present

## 2014-04-15 DIAGNOSIS — N950 Postmenopausal bleeding: Secondary | ICD-10-CM | POA: Insufficient documentation

## 2014-04-15 DIAGNOSIS — N841 Polyp of cervix uteri: Secondary | ICD-10-CM | POA: Diagnosis not present

## 2014-04-15 NOTE — Progress Notes (Signed)
Patient ID: Julia West, female   DOB: 10-02-1955, 59 y.o.   MRN: [id number]  Endometrial Biopsy: Patient given informed consent, signed copy in the chart, time out was performed. Time out taken. The patient was placed in the lithotomy position and the cervix brought into view with sterile speculum.  Portio of cervix cleansed x 2 with betadine swabs.  A tenaculum was placed in the anterior lip of the cervix. The uterus was sounded for depth of 8.5 cm. Milex uterine Explora 3 mm was introduced to into the uterus, suction created,  and an endometrial sample was obtained. All equipment was removed and accounted for.   The patient tolerated the procedure well.   Patient given post procedure instructions.  Followup: 4 days by phone  This chart was scribed for Jonnie Kind, MD by Donato Schultz, ED Scribe. This patient was seen in Room 2 and the patient's care was started at 11:54 AM.

## 2014-04-19 ENCOUNTER — Telehealth: Payer: Self-pay | Admitting: Obstetrics and Gynecology

## 2014-04-19 NOTE — Telephone Encounter (Signed)
Pt aware of results but has some questions on what the next step is and what she needs to do about the bleeding that she is having. I advised the pt that Dr..Ferguson is out of the office today and that he would be back Monday but that I would send him this message anyway but it may be Monday before she heard from him.

## 2014-04-24 NOTE — Telephone Encounter (Signed)
Pt has benign endometrial biopsy, but the 18 mm tissue lining in uterus is suggestive of a polyp or other pathology. Hysteroscopy and D&C recommended. Pt will see me on 22 Feb, and that can be preop Pt sees Dr Percival Spanish for cardiology, and should be fine for a minor procedure.

## 2014-05-03 ENCOUNTER — Encounter: Payer: Medicare Other | Admitting: Obstetrics and Gynecology

## 2014-05-06 ENCOUNTER — Encounter: Payer: Self-pay | Admitting: Obstetrics and Gynecology

## 2014-05-06 ENCOUNTER — Ambulatory Visit (INDEPENDENT_AMBULATORY_CARE_PROVIDER_SITE_OTHER): Payer: Medicare Other | Admitting: Obstetrics and Gynecology

## 2014-05-06 VITALS — BP 140/80 | Wt 350.0 lb

## 2014-05-06 DIAGNOSIS — N950 Postmenopausal bleeding: Secondary | ICD-10-CM

## 2014-05-06 NOTE — Progress Notes (Addendum)
Patient ID: Julia West, female   DOB: 1955-08-04, 59 y.o.   MRN: [id number]  Preoperative History and Physical  Julia West is a 59 y.o. No obstetric history on file. here for surgical management of endometrial polyps.   No significant preoperative concerns. Pt notes lymphedema on the left side after removal of lymph nodes.   Proposed surgery: hysteroscopy , dilation and curettage, removal of endometrial polyp  Past Medical History  Diagnosis Date  . Morbid obesity   . Other and unspecified hyperlipidemia   . Unspecified essential hypertension   . Acute myocardial infarction     with a ruptured plaque in the circumflex in 2003  . Degenerative joint disease   . Internal and external hemorrhoids without complication   . Anxiety   . Coronary artery disease   . Complication of anesthesia     states low O2 sats post-op 11/13  . Surgical wound, non healing ABDOMINAL     has wound vac @ 125 mm Hg   Past Surgical History  Procedure Laterality Date  . Cesarean section      x 4 in remote past  . Cholecystectomy    . Coronary artery bypass graft  2003    by Dr. Servando Snare. LIMA to the LAD, free RIMA to the circumflex. Stress perfusion study December 2009 with no high-risk areas of ischemia. She has a well-preserved ejection fraction  . Incisional hernia repair  02/07/2012    Procedure: HERNIA REPAIR INCISIONAL;  Surgeon: Gayland Curry, MD,FACS;  Location: [place];  Service: General;;  Open, Primary repair, strangulated Incisional hernia.  . Bowel resection  02/07/2012    Procedure: SMALL BOWEL RESECTION;  Surgeon: Gayland Curry, MD,FACS;  Location: [place];  Service: General;;  . Application of wound vac    . Incision and drainage of wound  04/17/2012    Procedure: IRRIGATION AND DEBRIDEMENT WOUND;  Surgeon: Theodoro Kos, DO;  Location: [place];  Service: Plastics;  Laterality: N/A;  OF ABDOMINAL WOUND, SURGICAL PREP AND PLACEMENT OF VAC, REMOVAL FOREHEAD SKIN  LESION  . Lesion removal  04/17/2012    Procedure: LESION REMOVAL;  Surgeon: Theodoro Kos, DO;  Location: [place];  Service: Plastics;  Laterality: N/A;   CENTER OF FOREHEAD, REMOVAL FORHEAD SKIN LESION  . Incision and drainage of wound N/A 04/24/2012    Procedure: IRRIGATION AND DEBRIDEMENT WOUND;  Surgeon: Theodoro Kos, DO;  Location: [place];  Service: Plastics;  Laterality: N/A;  I & D ABDOMINAL WOUND WITH VAC AND ACELL  . Incision and drainage of wound N/A 05/01/2012    Procedure: IRRIGATION AND DEBRIDEMENT WOUND;  Surgeon: Theodoro Kos, DO;  Location: [place];  Service: Plastics;  Laterality: N/A;  WITH SURGICAL PREP AND PLACEMENT OF VAC  . Incision and drainage of wound N/A 05/08/2012    Procedure: IRRIGATION AND DEBRIDEMENT OF ABD WOUND SURGICAL PREP AND PLACEMENT OF VAC ;  Surgeon: Theodoro Kos, DO;  Location: [place];  Service: Plastics;  Laterality: N/A;  IRRIGATION AND DEBRIDEMENT OF ABD WOUND SURGICAL PREP AND PLACEMENT OF VAC   . Cardiovascular stress test  01-17-2012  DR HOCHREIN    LOW RISK NUCLEAR TEST  . Incision and drainage of wound N/A 05/15/2012    Procedure: IRRIGATION AND DEBRIDEMENT OF ABDOMINAL WOUND WITH POSSIBLE SURGICAL PREP AND PLACEMENT OF VAC;  Surgeon: Theodoro Kos, DO;  Location: [place];  Service: Plastics;  Laterality: N/A;  .  Incision and drainage of wound N/A 05/22/2012    Procedure: IRRIGATION AND DEBRIDEMENT OF ABODOMINAL WOUND WITH  SURGICAL PREP AND VAC PLACEMENT;  Surgeon: Theodoro Kos, DO;  Location: [place];  Service: Plastics;  Laterality: N/A;  . Incision and drainage of wound N/A 06/28/2012    Procedure: IRRIGATION AND DEBRIDEMENT OF ABDOMINAL ULCER SURGICAL PREP AND PLACEMENT OF ACELL AND VAC;  Surgeon: Theodoro Kos, DO;  Location: [place];  Service: Plastics;  Laterality: N/A;   OB History  No data available  Patient  denies any other pertinent gynecologic issues.   Current Outpatient Prescriptions on File Prior to Visit  Medication Sig Dispense Refill  . aspirin 81 MG tablet Take 81 mg by mouth daily.     . clobetasol cream (TEMOVATE) 0.97 % Apply 1 application topically 2 (two) times daily. 30 g 0  . fish oil-omega-3 fatty acids 1000 MG capsule Take 2 g by mouth daily.      . furosemide (LASIX) 20 MG tablet Take 1 tablet (20 mg total) by mouth daily. 90 tablet 1  . ibuprofen (ADVIL,MOTRIN) 200 MG tablet Take 600 mg by mouth daily. Pain    . metoprolol (LOPRESSOR) 50 MG tablet Take 1 tablet (50 mg total) by mouth 2 (two) times daily. 180 tablet 1  . Multiple Vitamin (MULTIVITAMIN) tablet Take 1 tablet by mouth daily.    . valsartan-hydrochlorothiazide (DIOVAN-HCT) 160-12.5 MG per tablet Take 1 tablet by mouth daily. 90 tablet 3   No current facility-administered medications on file prior to visit.   Allergies  Allergen Reactions  . Ace Inhibitors Cough    Social History:   reports that she quit smoking about 2 years ago. Her smoking use included Cigarettes. She has a 25 pack-year smoking history. She has never used smokeless tobacco. She reports that she does not drink alcohol or use illicit drugs.  Family History  Problem Relation Age of Onset  . Heart attack Mother   . Hypertension Sister   . Diabetes Brother   . Birth defects Son     heart defect    Review of Systems: Noncontributory  PHYSICAL EXAM: Blood pressure 140/80, weight 350 lb (158.759 kg). General appearance - alert, well appearing, and in no distress Chest - clear to auscultation, no wheezes, rales or rhonchi, symmetric air entry Heart - normal rate (80 bpm) and regular rhythm Abdomen - soft, nontender, nondistended, no masses or organomegaly Pelvic - examination not indicated; completed previously Extremities - peripheral pulses normal, no pedal edema, no clubbing or cyanosis  Labs: No results found for this or any  previous visit (from the past 336 hour(s)).  Imaging Studies: US Transvaginal Non-ob  2014/04/24   GYNECOLOGIC SONOGRAM   Julia West is a 59 y.o.  for a pelvic sonogram for post  menopausal bleeding (with clots noted) off and on since October 2015.  Uterus                      Retroverted 7.1 x 4.8 x 4.4 cm,   Endometrium          18.3 mm, symmetrical, no obvious solitary mass noted  within the endometrial cavity  Right ovary             2.9 x 2.6 x 2.1 cm,   Left ovary                3.4 x 2.0 x 2.0 cm,   No  free fluid noted within the pelvis  Patient body habitus makes the scan technically difficult  Technician Comments:  Retroverted UTERUS, ENDOM- 18.78mm, bilateral adnexa/ovaries appear WNL, no  free fluid noted    Lazarus Gowda 04/08/2014 10:11 AM  Clinical Impression and recommendations:  I have reviewed the sonogram results above.  Combined with the patient's current clinical course, below are my  impressions and any appropriate recommendations for management based on  the sonographic findings:  1 Abnormally thickened endometrium, recommend endometrial biopsy. 2. Patient reports that she has had prior biopsy in 2012, but current  biopsy still advised . No evidence of prior biopsy in EPIC.  Elajah Kunsman V     Assessment: Patient Active Problem List   Diagnosis Date Noted  . Post-menopausal bleeding 04/15/2014  . Endometrial thickening on ultra sound 04/09/2014  . Ventral hernia 03/06/2012  . Open abdominal wall wound 03/06/2012  . DYSLIPIDEMIA 11/12/2008  . OBESITY, MORBID 11/12/2008  . HYPERTENSION 11/12/2008  . CAD 11/12/2008    Plan: Patient will undergo surgical management with hysteroscopy, dilation and curettage, removal of endometrial polyp.   The risks of surgery were discussed in detail with the patient including but not limited to: bleeding which may require transfusion or reoperation; infection which may require antibiotics; injury to surrounding organs which may  involve bowel, bladder, ureters ; need for additional procedures including laparoscopy or laparotomy; thromboembolic phenomenon, surgical site problems and other postoperative/anesthesia complications. Likelihood of success in alleviating the patient's condition was discussed. Routine postoperative instructions will be reviewed with the patient and her family in detail after surgery.   05/06/2014 10:55 AM  This chart was scribed for Jonnie Kind, MD by Tula Nakayama, ED Scribe. This patient was seen in office and the patient's care was started at 10:55 AM.   I personally performed the services described in this documentation, which was scribed in my presence. The recorded information has been reviewed and considered accurate. It has been edited as necessary during review. Jonnie Kind, MD

## 2014-05-06 NOTE — Progress Notes (Signed)
Patient ID: Julia West, female   DOB: 1956-02-26, 59 y.o.   MRN: [id number]

## 2014-05-09 ENCOUNTER — Other Ambulatory Visit (HOSPITAL_COMMUNITY): Payer: Medicare Other

## 2014-05-09 NOTE — Patient Instructions (Signed)
Julia West  05/09/2014   Your procedure is scheduled on:  05/14/2014  Report to [facility] at  50  AM.  Call this number if you have problems the morning of surgery: [phone]   Remember:   Do not eat food or drink liquids after midnight.   Take these medicines the morning of surgery with A SIP OF WATER:  Metoprolol, valsartan   Do not wear jewelry, make-up or nail polish.  Do not wear lotions, powders, or perfumes.   Do not shave 48 hours prior to surgery. Men may shave face and neck.  Do not bring valuables to the hospital.  [facility] is not responsible for any belongings or valuables.               Contacts, dentures or bridgework may not be worn into surgery.  Leave suitcase in the car. After surgery it may be brought to your room.  For patients admitted to the hospital, discharge time is determined by your treatment team.               Patients discharged the day of surgery will not be allowed to drive home.  Name and phone number of your driver: family  Special Instructions: Shower using CHG 2 nights before surgery and the night before surgery.  If you shower the day of surgery use CHG.  Use special wash - you have one bottle of CHG for all showers.  You should use approximately 1/3 of the bottle for each shower.   Please read over the following fact sheets that you were given: Pain Booklet, Coughing and Deep Breathing, Surgical Site Infection Prevention, Anesthesia Post-op Instructions and Care and Recovery After Surgery Endometrial Ablation Endometrial ablation removes the lining of the uterus (endometrium). It is usually a same-day, outpatient treatment. Ablation helps avoid major surgery, such as surgery to remove the cervix and uterus (hysterectomy). After endometrial ablation, you will have little or no menstrual bleeding and may not be able to have children. However, if you are premenopausal, you will need to use a reliable method of birth control  following the procedure because of the small chance that pregnancy can occur. There are different reasons to have this procedure, which include:  Heavy periods.  Bleeding that is causing anemia.  Irregular bleeding.  Bleeding fibroids on the lining inside the uterus if they are smaller than 3 centimeters. This procedure should not be done if:  You want children in the future.  You have severe cramps with your menstrual period.  You have precancerous or cancerous cells in your uterus.  You were recently pregnant.  You have gone through menopause.  You have had major surgery on the uterus, such as a cesarean delivery. LET [facility] CARE PROVIDER KNOW ABOUT:  Any allergies you have.  All medicines you are taking, including vitamins, herbs, eye drops, creams, and over-the-counter medicines.  Previous problems you or members of your family have had with the use of anesthetics.  Any blood disorders you have.  Previous surgeries you have had.  Medical conditions you have. RISKS AND COMPLICATIONS  Generally, this is a safe procedure. However, as with any procedure, complications can occur. Possible complications include:  Perforation of the uterus.  Bleeding.  Infection of the uterus, bladder, or vagina.  Injury to surrounding organs.  An air bubble to the lung (air embolus).  Pregnancy following the procedure.  Failure of the procedure  to help the problem, requiring hysterectomy.  Decreased ability to diagnose cancer in the lining of the uterus. BEFORE THE PROCEDURE  The lining of the uterus must be tested to make sure there is no pre-cancerous or cancer cells present.  An ultrasound may be performed to look at the size of the uterus and to check for abnormalities.  Medicines may be given to thin the lining of the uterus. PROCEDURE  During the procedure, your health care provider will use a tool called a resectoscope to help see inside your uterus. There are  different ways to remove the lining of your uterus.   Radiofrequency - This method uses a radiofrequency-alternating electric current to remove the lining of the uterus.  Cryotherapy - This method uses extreme cold to freeze the lining of the uterus.  Heated-Free Liquid - This method uses heated salt (saline) solution to remove the lining of the uterus.  Microwave - This method uses high-energy microwaves to heat up the lining of the uterus to remove it.  Thermal balloon - This method involves inserting a catheter with a balloon tip into the uterus. The balloon tip is filled with heated fluid to remove the lining of the uterus. AFTER THE PROCEDURE  After your procedure, do not have sexual intercourse or insert anything into your vagina until permitted by your health care provider. After the procedure, you may experience:  Cramps.  Vaginal discharge.  Frequent urination. Document Released: 01/09/2004 Document Revised: 11/01/2012 Document Reviewed: 08/02/2012 [facility] Patient Information 2015 [place], [place]. This information is not intended to replace advice given to you by your health care provider. Make sure you discuss any questions you have with your health care provider. Hysteroscopy Hysteroscopy is a procedure used for looking inside the womb (uterus). It may be done for various reasons, including:  To evaluate abnormal bleeding, fibroid (benign, noncancerous) tumors, polyps, scar tissue (adhesions), and possibly cancer of the uterus.  To look for lumps (tumors) and other uterine growths.  To look for causes of why a woman cannot get pregnant (infertility), causes of recurrent loss of pregnancy (miscarriages), or a lost intrauterine device (IUD).  To perform a sterilization by blocking the fallopian tubes from inside the uterus. In this procedure, a thin, flexible tube with a tiny light and camera on the end of it (hysteroscope) is used to look inside the uterus. A hysteroscopy  should be done right after a menstrual period to be sure you are not pregnant. LET [facility] CARE PROVIDER KNOW ABOUT:   Any allergies you have.  All medicines you are taking, including vitamins, herbs, eye drops, creams, and over-the-counter medicines.  Previous problems you or members of your family have had with the use of anesthetics.  Any blood disorders you have.  Previous surgeries you have had.  Medical conditions you have. RISKS AND COMPLICATIONS  Generally, this is a safe procedure. However, as with any procedure, complications can occur. Possible complications include:  Putting a hole in the uterus.  Excessive bleeding.  Infection.  Damage to the cervix.  Injury to other organs.  Allergic reaction to medicines.  Too much fluid used in the uterus for the procedure. BEFORE THE PROCEDURE   Ask your health care provider about changing or stopping any regular medicines.  Do not take aspirin or blood thinners for 1 week before the procedure, or as directed by your health care provider. These can cause bleeding.  If you smoke, do not smoke for 2 weeks before the procedure.  In some cases, a medicine is placed in the cervix the day before the procedure. This medicine makes the cervix have a larger opening (dilate). This makes it easier for the instrument to be inserted into the uterus during the procedure.  Do not eat or drink anything for at least 8 hours before the surgery.  Arrange for someone to take you home after the procedure. PROCEDURE   You may be given a medicine to relax you (sedative). You may also be given one of the following:  A medicine that numbs the area around the cervix (local anesthetic).  A medicine that makes you sleep through the procedure (general anesthetic).  The hysteroscope is inserted through the vagina into the uterus. The camera on the hysteroscope sends a picture to a TV screen. This gives the surgeon a good view inside the  uterus.  During the procedure, air or a liquid is put into the uterus, which allows the surgeon to see better.  Sometimes, tissue is gently scraped from inside the uterus. These tissue samples are sent to a lab for testing. AFTER THE PROCEDURE   If you had a general anesthetic, you may be groggy for a couple hours after the procedure.  If you had a local anesthetic, you will be able to go home as soon as you are stable and feel ready.  You may have some cramping. This normally lasts for a couple days.  You may have bleeding, which varies from light spotting for a few days to menstrual-like bleeding for 3-7 days. This is normal.  If your test results are not back during the visit, make an appointment with your health care provider to find out the results. Document Released: 06/07/2000 Document Revised: 12/20/2012 Document Reviewed: 09/28/2012 [facility] Patient Information 2015 [place], [place]. This information is not intended to replace advice given to you by your health care provider. Make sure you discuss any questions you have with your health care provider. Dilation and Curettage or Vacuum Curettage Dilation and curettage (D&C) and vacuum curettage are minor procedures. A D&C involves stretching (dilation) the cervix and scraping (curettage) the inside lining of the womb (uterus). During a D&C, tissue is gently scraped from the inside lining of the uterus. During a vacuum curettage, the lining and tissue in the uterus are removed with the use of gentle suction.  Curettage may be performed to either diagnose or treat a problem. As a diagnostic procedure, curettage is performed to examine tissues from the uterus. A diagnostic curettage may be performed for the following symptoms:   Irregular bleeding in the uterus.   Bleeding with the development of clots.   Spotting between menstrual periods.   Prolonged menstrual periods.   Bleeding after menopause.   No menstrual period  (amenorrhea).   A change in size and shape of the uterus.  As a treatment procedure, curettage may be performed for the following reasons:   Removal of an IUD (intrauterine device).   Removal of retained placenta after giving birth. Retained placenta can cause an infection or bleeding severe enough to require transfusions.   Abortion.   Miscarriage.   Removal of polyps inside the uterus.   Removal of uncommon types of noncancerous lumps (fibroids).  LET [facility] CARE PROVIDER KNOW ABOUT:   Any allergies you have.   All medicines you are taking, including vitamins, herbs, eye drops, creams, and over-the-counter medicines.   Previous problems you or members of your family have had with the use of anesthetics.  Any blood disorders you have.   Previous surgeries you have had.   Medical conditions you have. RISKS AND COMPLICATIONS  Generally, this is a safe procedure. However, as with any procedure, complications can occur. Possible complications include:  Excessive bleeding.   Infection of the uterus.   Damage to the cervix.   Development of scar tissue (adhesions) inside the uterus, later causing abnormal amounts of menstrual bleeding.   Complications from the general anesthetic, if a general anesthetic is used.   Putting a hole (perforation) in the uterus. This is rare.  BEFORE THE PROCEDURE   Eat and drink before the procedure only as directed by your health care provider.   Arrange for someone to take you home.  PROCEDURE  This procedure usually takes about 15-30 minutes.  You will be given one of the following:  A medicine that numbs the area in and around the cervix (local anesthetic).   A medicine to make you sleep through the procedure (general anesthetic).  You will lie on your back with your legs in stirrups.   A warm metal or plastic instrument (speculum) will be placed in your vagina to keep it open and to allow the health  care provider to see the cervix.  There are two ways in which your cervix can be softened and dilated. These include:   Taking a medicine.   Having thin rods (laminaria) inserted into your cervix.   A curved tool (curette) will be used to scrape cells from the inside lining of the uterus. In some cases, gentle suction is applied with the curette. The curette will then be removed.  AFTER THE PROCEDURE   You will rest in the recovery area until you are stable and are ready to go home.   You may feel sick to your stomach (nauseous) or throw up (vomit) if you were given a general anesthetic.   You may have a sore throat if a tube was placed in your throat during general anesthesia.   You may have light cramping and bleeding. This may last for 2 days to 2 weeks after the procedure.   Your uterus needs to make a new lining after the procedure. This may make your next period late. Document Released: 03/01/2005 Document Revised: 11/01/2012 Document Reviewed: 09/28/2012 [facility] Patient Information 2015 [place], [place]. This information is not intended to replace advice given to you by your health care provider. Make sure you discuss any questions you have with your health care provider. PATIENT INSTRUCTIONS POST-ANESTHESIA  IMMEDIATELY FOLLOWING SURGERY:  Do not drive or operate machinery for the first twenty four hours after surgery.  Do not make any important decisions for twenty four hours after surgery or while taking narcotic pain medications or sedatives.  If you develop intractable nausea and vomiting or a severe headache please notify your doctor immediately.  FOLLOW-UP:  Please make an appointment with your surgeon as instructed. You do not need to follow up with anesthesia unless specifically instructed to do so.  WOUND CARE INSTRUCTIONS (if applicable):  Keep a dry clean dressing on the anesthesia/puncture wound site if there is drainage.  Once the wound has quit draining you  may leave it open to air.  Generally you should leave the bandage intact for twenty four hours unless there is drainage.  If the epidural site drains for more than 36-48 hours please call the anesthesia department.  QUESTIONS?:  Please feel free to call your physician or the hospital operator if you have  any questions, and they will be happy to assist you.

## 2014-05-10 ENCOUNTER — Other Ambulatory Visit: Payer: Self-pay | Admitting: Obstetrics and Gynecology

## 2014-05-10 ENCOUNTER — Encounter (HOSPITAL_COMMUNITY): Payer: Self-pay

## 2014-05-10 ENCOUNTER — Other Ambulatory Visit: Payer: Self-pay

## 2014-05-10 ENCOUNTER — Encounter (HOSPITAL_COMMUNITY)
Admission: RE | Admit: 2014-05-10 | Discharge: 2014-05-10 | Disposition: A | Discharge status: Home / Self Care | Payer: Medicare Other | Source: Ambulatory Visit | Attending: Obstetrics and Gynecology | Admitting: Obstetrics and Gynecology

## 2014-05-10 DIAGNOSIS — N950 Postmenopausal bleeding: Secondary | ICD-10-CM | POA: Diagnosis not present

## 2014-05-10 DIAGNOSIS — Z01818 Encounter for other preprocedural examination: Secondary | ICD-10-CM | POA: Diagnosis not present

## 2014-05-10 DIAGNOSIS — N840 Polyp of corpus uteri: Secondary | ICD-10-CM | POA: Diagnosis not present

## 2014-05-10 HISTORY — DX: Lymphedema, not elsewhere classified: I89.0

## 2014-05-10 LAB — URINALYSIS, ROUTINE W REFLEX MICROSCOPIC
Bilirubin Urine: NEGATIVE
GLUCOSE, UA: NEGATIVE mg/dL
KETONES UR: NEGATIVE mg/dL
Leukocytes, UA: NEGATIVE
Nitrite: NEGATIVE
Protein, ur: NEGATIVE mg/dL
Specific Gravity, Urine: 1.015 (ref 1.005–1.030)
Urobilinogen, UA: 0.2 mg/dL (ref 0.0–1.0)
pH: 5 (ref 5.0–8.0)

## 2014-05-10 LAB — CBC
HCT: 42.1 % (ref 36.0–46.0)
HEMOGLOBIN: 13.4 g/dL (ref 12.0–15.0)
MCH: 28.2 pg (ref 26.0–34.0)
MCHC: 31.8 g/dL (ref 30.0–36.0)
MCV: 88.6 fL (ref 78.0–100.0)
Platelets: 250 10*3/uL (ref 150–400)
RBC: 4.75 MIL/uL (ref 3.87–5.11)
RDW: 15.4 % (ref 11.5–15.5)
WBC: 12.2 10*3/uL — ABNORMAL HIGH (ref 4.0–10.5)

## 2014-05-10 LAB — COMPREHENSIVE METABOLIC PANEL
ALT: 15 U/L (ref 0–35)
AST: 17 U/L (ref 0–37)
Albumin: 3.6 g/dL (ref 3.5–5.2)
Alkaline Phosphatase: 59 U/L (ref 39–117)
Anion gap: 7 (ref 5–15)
BILIRUBIN TOTAL: 0.3 mg/dL (ref 0.3–1.2)
BUN: 21 mg/dL (ref 6–23)
CHLORIDE: 103 mmol/L (ref 96–112)
CO2: 29 mmol/L (ref 19–32)
Calcium: 9 mg/dL (ref 8.4–10.5)
Creatinine, Ser: 0.77 mg/dL (ref 0.50–1.10)
GFR calc Af Amer: >90 mL/min (ref >90)
GFR, EST NON AFRICAN AMERICAN: 89 mL/min — AB (ref >90)
Glucose, Bld: 109 mg/dL — ABNORMAL HIGH (ref 70–99)
POTASSIUM: 3.8 mmol/L (ref 3.5–5.1)
Sodium: 139 mmol/L (ref 135–145)
TOTAL PROTEIN: 6.7 g/dL (ref 6.0–8.3)

## 2014-05-10 LAB — HCG, SERUM, QUALITATIVE: Preg, Serum: NEGATIVE

## 2014-05-10 LAB — URINE MICROSCOPIC-ADD ON

## 2014-05-13 NOTE — Pre-Procedure Instructions (Signed)
Dr Patsey Berthold aware of EKG and no pt's meds. No orders given.

## 2014-05-14 ENCOUNTER — Ambulatory Visit (HOSPITAL_COMMUNITY)
Admission: RE | Admit: 2014-05-14 | Discharge: 2014-05-14 | Disposition: A | Discharge status: Home / Self Care | Payer: Medicare Other | Source: Ambulatory Visit | Attending: Obstetrics and Gynecology | Admitting: Obstetrics and Gynecology

## 2014-05-14 ENCOUNTER — Encounter (HOSPITAL_COMMUNITY)
Admission: RE | Disposition: A | Discharge status: Home / Self Care | Payer: Self-pay | Source: Ambulatory Visit | Attending: Obstetrics and Gynecology

## 2014-05-14 ENCOUNTER — Ambulatory Visit (HOSPITAL_COMMUNITY): Payer: Medicare Other | Admitting: Anesthesiology

## 2014-05-14 ENCOUNTER — Encounter (HOSPITAL_COMMUNITY): Payer: Self-pay

## 2014-05-14 DIAGNOSIS — Z87891 Personal history of nicotine dependence: Secondary | ICD-10-CM | POA: Diagnosis not present

## 2014-05-14 DIAGNOSIS — N8501 Benign endometrial hyperplasia: Secondary | ICD-10-CM | POA: Diagnosis not present

## 2014-05-14 DIAGNOSIS — Z951 Presence of aortocoronary bypass graft: Secondary | ICD-10-CM | POA: Diagnosis not present

## 2014-05-14 DIAGNOSIS — E785 Hyperlipidemia, unspecified: Secondary | ICD-10-CM | POA: Diagnosis not present

## 2014-05-14 DIAGNOSIS — E6609 Other obesity due to excess calories: Secondary | ICD-10-CM | POA: Insufficient documentation

## 2014-05-14 DIAGNOSIS — I10 Essential (primary) hypertension: Secondary | ICD-10-CM | POA: Diagnosis not present

## 2014-05-14 DIAGNOSIS — F419 Anxiety disorder, unspecified: Secondary | ICD-10-CM | POA: Insufficient documentation

## 2014-05-14 DIAGNOSIS — Z888 Allergy status to other drugs, medicaments and biological substances status: Secondary | ICD-10-CM | POA: Insufficient documentation

## 2014-05-14 DIAGNOSIS — N840 Polyp of corpus uteri: Secondary | ICD-10-CM | POA: Insufficient documentation

## 2014-05-14 DIAGNOSIS — M1990 Unspecified osteoarthritis, unspecified site: Secondary | ICD-10-CM | POA: Insufficient documentation

## 2014-05-14 DIAGNOSIS — Z6841 Body Mass Index (BMI) 40.0 and over, adult: Secondary | ICD-10-CM | POA: Diagnosis not present

## 2014-05-14 DIAGNOSIS — I2510 Atherosclerotic heart disease of native coronary artery without angina pectoris: Secondary | ICD-10-CM | POA: Diagnosis not present

## 2014-05-14 DIAGNOSIS — Z7982 Long term (current) use of aspirin: Secondary | ICD-10-CM | POA: Diagnosis not present

## 2014-05-14 DIAGNOSIS — N950 Postmenopausal bleeding: Secondary | ICD-10-CM | POA: Diagnosis not present

## 2014-05-14 DIAGNOSIS — Z9049 Acquired absence of other specified parts of digestive tract: Secondary | ICD-10-CM | POA: Insufficient documentation

## 2014-05-14 DIAGNOSIS — I252 Old myocardial infarction: Secondary | ICD-10-CM | POA: Insufficient documentation

## 2014-05-14 HISTORY — PX: POLYPECTOMY: SHX5525

## 2014-05-14 HISTORY — PX: HYSTEROSCOPY WITH D & C: SHX1775

## 2014-05-14 SURGERY — POLYPECTOMY
Anesthesia: General

## 2014-05-14 MED ORDER — CEFAZOLIN SODIUM-DEXTROSE 2-3 GM-% IV SOLR
INTRAVENOUS | Status: AC
  Filled 2014-05-14: qty 50

## 2014-05-14 MED ORDER — PROPOFOL 10 MG/ML IV BOLUS
INTRAVENOUS | Status: DC | PRN
  Administered 2014-05-14: 200 mg via INTRAVENOUS

## 2014-05-14 MED ORDER — LACTATED RINGERS IV SOLN
INTRAVENOUS | Status: DC
Start: 2014-05-14 — End: 2014-05-14
  Administered 2014-05-14: 07:00:00 via INTRAVENOUS

## 2014-05-14 MED ORDER — ONDANSETRON HCL 4 MG/2ML IJ SOLN
4.0000 mg | Freq: Once | INTRAMUSCULAR | Status: AC
  Administered 2014-05-14: 4 mg via INTRAVENOUS

## 2014-05-14 MED ORDER — GLYCOPYRROLATE 0.2 MG/ML IJ SOLN
INTRAMUSCULAR | Status: AC
  Filled 2014-05-14: qty 1

## 2014-05-14 MED ORDER — KETOROLAC TROMETHAMINE 30 MG/ML IJ SOLN
30.0000 mg | Freq: Once | INTRAMUSCULAR | Status: AC
  Administered 2014-05-14: 30 mg via INTRAVENOUS

## 2014-05-14 MED ORDER — DEXTROSE 5 % IV SOLN
3.0000 g | INTRAVENOUS | Status: DC
  Administered 2014-05-14: 3 g via INTRAVENOUS
  Filled 2014-05-14: qty 3000

## 2014-05-14 MED ORDER — KETOROLAC TROMETHAMINE 30 MG/ML IJ SOLN
INTRAMUSCULAR | Status: AC
  Filled 2014-05-14: qty 1

## 2014-05-14 MED ORDER — SUCCINYLCHOLINE CHLORIDE 20 MG/ML IJ SOLN
INTRAMUSCULAR | Status: DC | PRN
  Administered 2014-05-14: 130 mg via INTRAVENOUS

## 2014-05-14 MED ORDER — ROCURONIUM BROMIDE 100 MG/10ML IV SOLN
INTRAVENOUS | Status: DC | PRN
  Administered 2014-05-14: 8 mg via INTRAVENOUS

## 2014-05-14 MED ORDER — MIDAZOLAM HCL 2 MG/2ML IJ SOLN
INTRAMUSCULAR | Status: AC
  Filled 2014-05-14: qty 2

## 2014-05-14 MED ORDER — FENTANYL CITRATE 0.05 MG/ML IJ SOLN
INTRAMUSCULAR | Status: DC | PRN
  Administered 2014-05-14: 50 ug via INTRAVENOUS
  Administered 2014-05-14: 25 ug via INTRAVENOUS
  Administered 2014-05-14 (×2): 50 ug via INTRAVENOUS

## 2014-05-14 MED ORDER — EPHEDRINE SULFATE 50 MG/ML IJ SOLN
INTRAMUSCULAR | Status: DC | PRN
  Administered 2014-05-14 (×3): 10 mg via INTRAVENOUS

## 2014-05-14 MED ORDER — ONDANSETRON HCL 4 MG/2ML IJ SOLN
INTRAMUSCULAR | Status: AC
  Filled 2014-05-14: qty 2

## 2014-05-14 MED ORDER — FENTANYL CITRATE 0.05 MG/ML IJ SOLN: 25.0000 ug | INTRAMUSCULAR | Status: DC | PRN

## 2014-05-14 MED ORDER — PROPOFOL 10 MG/ML IV BOLUS
INTRAVENOUS | Status: AC
  Filled 2014-05-14: qty 20

## 2014-05-14 MED ORDER — SODIUM CHLORIDE 0.9 % IR SOLN
Status: DC | PRN
  Administered 2014-05-14: 3000 mL

## 2014-05-14 MED ORDER — FENTANYL CITRATE 0.05 MG/ML IJ SOLN
INTRAMUSCULAR | Status: AC
  Filled 2014-05-14: qty 5

## 2014-05-14 MED ORDER — CEFAZOLIN SODIUM 1-5 GM-% IV SOLN
INTRAVENOUS | Status: AC
  Filled 2014-05-14: qty 50

## 2014-05-14 MED ORDER — MIDAZOLAM HCL 2 MG/2ML IJ SOLN
1.0000 mg | INTRAMUSCULAR | Status: DC | PRN
  Administered 2014-05-14: 2 mg via INTRAVENOUS

## 2014-05-14 MED ORDER — LIDOCAINE HCL 1 % IJ SOLN
INTRAMUSCULAR | Status: DC | PRN
  Administered 2014-05-14: 50 mg via INTRADERMAL

## 2014-05-14 MED ORDER — BUPIVACAINE-EPINEPHRINE (PF) 0.5% -1:200000 IJ SOLN
INTRAMUSCULAR | Status: AC
  Filled 2014-05-14: qty 30

## 2014-05-14 MED ORDER — GLYCOPYRROLATE 0.2 MG/ML IJ SOLN
0.2000 mg | Freq: Once | INTRAMUSCULAR | Status: AC
  Administered 2014-05-14: 0.2 mg via INTRAVENOUS

## 2014-05-14 MED ORDER — ONDANSETRON HCL 4 MG/2ML IJ SOLN
4.0000 mg | Freq: Once | INTRAMUSCULAR | Status: DC | PRN
Start: 2014-05-14 — End: 2014-05-14

## 2014-05-14 SURGICAL SUPPLY — 29 items
BAG DECANTER FOR FLEXI CONT (MISCELLANEOUS) ×2 IMPLANT
BAG HAMPER (MISCELLANEOUS) ×2 IMPLANT
CLOTH BEACON ORANGE TIMEOUT ST (SAFETY) ×2 IMPLANT
COVER LIGHT HANDLE STERIS (MISCELLANEOUS) ×4 IMPLANT
DECANTER SPIKE VIAL GLASS SM (MISCELLANEOUS) ×2 IMPLANT
DRAPE PROXIMA HALF (DRAPES) ×2 IMPLANT
ELECT REM PT RETURN 9FT ADLT (ELECTROSURGICAL) ×2
ELECTRODE REM PT RTRN 9FT ADLT (ELECTROSURGICAL) ×1 IMPLANT
FORMALIN 10 PREFIL 120ML (MISCELLANEOUS) ×2 IMPLANT
GLOVE BIOGEL M 7.0 STRL (GLOVE) ×2 IMPLANT
GLOVE BIOGEL PI IND STRL 7.0 (GLOVE) ×2 IMPLANT
GLOVE BIOGEL PI IND STRL 9 (GLOVE) ×1 IMPLANT
GLOVE BIOGEL PI INDICATOR 7.0 (GLOVE) ×2
GLOVE BIOGEL PI INDICATOR 9 (GLOVE) ×1
GLOVE ECLIPSE 6.5 STRL STRAW (GLOVE) ×2 IMPLANT
GLOVE ECLIPSE 9.0 STRL (GLOVE) ×2 IMPLANT
GOWN SPEC L3 XXLG W/TWL (GOWN DISPOSABLE) ×2 IMPLANT
GOWN STRL REUS W/ TWL LRG LVL3 (GOWN DISPOSABLE) ×2 IMPLANT
GOWN STRL REUS W/TWL LRG LVL3 (GOWN DISPOSABLE) ×2
INST SET HYSTEROSCOPY (KITS) ×2 IMPLANT
IV NS IRRIG 3000ML ARTHROMATIC (IV SOLUTION) ×2 IMPLANT
KIT ROOM TURNOVER APOR (KITS) ×2 IMPLANT
MANIFOLD NEPTUNE II (INSTRUMENTS) ×2 IMPLANT
NS IRRIG 1000ML POUR BTL (IV SOLUTION) ×2 IMPLANT
PACK PERI GYN (CUSTOM PROCEDURE TRAY) ×2 IMPLANT
PAD ARMBOARD 7.5X6 YLW CONV (MISCELLANEOUS) ×2 IMPLANT
PAD TELFA 3X4 1S STER (GAUZE/BANDAGES/DRESSINGS) ×2 IMPLANT
SET BASIN LINEN APH (SET/KITS/TRAYS/PACK) ×2 IMPLANT
SET IRRIG Y TYPE TUR BLADDER L (SET/KITS/TRAYS/PACK) ×2 IMPLANT

## 2014-05-14 NOTE — Anesthesia Preprocedure Evaluation (Signed)
Anesthesia Evaluation  Patient identified by MRN, date of birth, ID band Patient awake    Reviewed: Allergy & Precautions, NPO status , Patient's Chart, lab work & pertinent test results, reviewed documented beta blocker date and time   History of Anesthesia Complications (+) history of anesthetic complications ("low O2 sat after surgery ")  Airway Mallampati: II  TM Distance: >3 FB Neck ROM: Full    Dental  (+) Edentulous Upper, Edentulous Lower   Pulmonary former smoker,  breath sounds clear to auscultation        Cardiovascular hypertension, Pt. on medications and Pt. on home beta blockers + CAD, + Past MI and + DOE Rhythm:Regular Rate:Bradycardia     Neuro/Psych PSYCHIATRIC DISORDERS Anxiety    GI/Hepatic   Endo/Other  Morbid obesity  Renal/GU      Musculoskeletal  (+) Arthritis -,   Abdominal   Peds  Hematology   Anesthesia Other Findings   Reproductive/Obstetrics                             Anesthesia Physical Anesthesia Plan  ASA: III  Anesthesia Plan: General   Post-op Pain Management:    Induction: Intravenous, Rapid sequence and Cricoid pressure planned  Airway Management Planned: Oral ETT  Additional Equipment:   Intra-op Plan:   Post-operative Plan: Extubation in OR  Informed Consent: I have reviewed the patients History and Physical, chart, labs and discussed the procedure including the risks, benefits and alternatives for the proposed anesthesia with the patient or authorized representative who has indicated his/her understanding and acceptance.     Plan Discussed with:   Anesthesia Plan Comments:         Anesthesia Quick Evaluation

## 2014-05-14 NOTE — Op Note (Signed)
See brief operative note for details

## 2014-05-14 NOTE — Anesthesia Procedure Notes (Signed)
Procedure Name: Intubation Date/Time: 05/14/2014 7:45 AM Performed by: Tressie Stalker E Pre-anesthesia Checklist: Patient identified, Patient being monitored, Timeout performed, Emergency Drugs available and Suction available Patient Re-evaluated:Patient Re-evaluated prior to inductionOxygen Delivery Method: Circle System Utilized Preoxygenation: Pre-oxygenation with 100% oxygen Intubation Type: IV induction, Rapid sequence and Cricoid Pressure applied Ventilation: Mask ventilation without difficulty Laryngoscope Size: Mac and 3 Grade View: Grade I Tube type: Oral Tube size: 7.0 mm Number of attempts: 1 Airway Equipment and Method: Stylet Placement Confirmation: ETT inserted through vocal cords under direct vision,  positive ETCO2 and breath sounds checked- equal and bilateral Secured at: 22 cm Tube secured with: Tape Dental Injury: Teeth and Oropharynx as per pre-operative assessment

## 2014-05-14 NOTE — H&P (Signed)
Expand All Collapse All   Patient ID: Julia West, female DOB: [birth year], 59 y.o. MRN: [id number]  Preoperative History and Physical  Julia West is a 59 y.o. No obstetric history on file. here for surgical management of endometrial polyps. No significant preoperative concerns. Pt notes lymphedema on the left side after removal of lymph nodes.   Proposed surgery: hysteroscopy , dilation and curettage, removal of endometrial polyp  Past Medical History  Diagnosis Date  . Morbid obesity   . Other and unspecified hyperlipidemia   . Unspecified essential hypertension   . Acute myocardial infarction     with a ruptured plaque in the circumflex in 2003  . Degenerative joint disease   . Internal and external hemorrhoids without complication   . Anxiety   . Coronary artery disease   . Complication of anesthesia     states low O2 sats post-op 11/13  . Surgical wound, non healing ABDOMINAL     has wound vac @ 125 mm Hg   Past Surgical History  Procedure Laterality Date  . Cesarean section      x 4 in remote past  . Cholecystectomy    . Coronary artery bypass graft  2003    by Dr. Servando Snare. LIMA to the LAD, free RIMA to the circumflex. Stress perfusion study December 2009 with no high-risk areas of ischemia. She has a well-preserved ejection fraction  . Incisional hernia repair  02/07/2012    Procedure: HERNIA REPAIR INCISIONAL; Surgeon: Gayland Curry, MD,FACS; Location: [place]; Service: General;; Open, Primary repair, strangulated Incisional hernia.  . Bowel resection  02/07/2012    Procedure: SMALL BOWEL RESECTION; Surgeon: Gayland Curry, MD,FACS; Location: [place]; Service: General;;  . Application of wound vac    . Incision and drainage of wound  04/17/2012    Procedure: IRRIGATION AND DEBRIDEMENT WOUND; Surgeon: Theodoro Kos, DO; Location: [place];  Service: Plastics; Laterality: N/A; OF ABDOMINAL WOUND, SURGICAL PREP AND PLACEMENT OF VAC, REMOVAL FOREHEAD SKIN LESION  . Lesion removal  04/17/2012    Procedure: LESION REMOVAL; Surgeon: Theodoro Kos, DO; Location: [place]; Service: Plastics; Laterality: N/A; CENTER OF FOREHEAD, REMOVAL FORHEAD SKIN LESION  . Incision and drainage of wound N/A 04/24/2012    Procedure: IRRIGATION AND DEBRIDEMENT WOUND; Surgeon: Theodoro Kos, DO; Location: [place]; Service: Plastics; Laterality: N/A; I & D ABDOMINAL WOUND WITH VAC AND ACELL  . Incision and drainage of wound N/A 05/01/2012    Procedure: IRRIGATION AND DEBRIDEMENT WOUND; Surgeon: Theodoro Kos, DO; Location: [place]; Service: Plastics; Laterality: N/A; WITH SURGICAL PREP AND PLACEMENT OF VAC  . Incision and drainage of wound N/A 05/08/2012    Procedure: IRRIGATION AND DEBRIDEMENT OF ABD WOUND SURGICAL PREP AND PLACEMENT OF VAC ; Surgeon: Theodoro Kos, DO; Location: [place]; Service: Plastics; Laterality: N/A; IRRIGATION AND DEBRIDEMENT OF ABD WOUND SURGICAL PREP AND PLACEMENT OF VAC   . Cardiovascular stress test  01-17-2012 DR HOCHREIN    LOW RISK NUCLEAR TEST  . Incision and drainage of wound N/A 05/15/2012    Procedure: IRRIGATION AND DEBRIDEMENT OF ABDOMINAL WOUND WITH POSSIBLE SURGICAL PREP AND PLACEMENT OF VAC; Surgeon: Theodoro Kos, DO; Location: [place]; Service: Plastics; Laterality: N/A;  . Incision and drainage of wound N/A 05/22/2012    Procedure: IRRIGATION AND DEBRIDEMENT OF ABODOMINAL WOUND WITH SURGICAL PREP AND VAC PLACEMENT; Surgeon: Theodoro Kos, DO; Location: [place]; Service: Plastics; Laterality: N/A;  .  Incision and drainage of wound N/A 06/28/2012    Procedure: IRRIGATION AND DEBRIDEMENT OF ABDOMINAL ULCER SURGICAL PREP AND  PLACEMENT OF ACELL AND VAC; Surgeon: Theodoro Kos, DO; Location: [place]; Service: Plastics; Laterality: N/A;   OB History  No data available  Patient denies any other pertinent gynecologic issues.   Current Outpatient Prescriptions on File Prior to Visit  Medication Sig Dispense Refill  . aspirin 81 MG tablet Take 81 mg by mouth daily.     . clobetasol cream (TEMOVATE) 5.42 % Apply 1 application topically 2 (two) times daily. 30 g 0  . fish oil-omega-3 fatty acids 1000 MG capsule Take 2 g by mouth daily.     . furosemide (LASIX) 20 MG tablet Take 1 tablet (20 mg total) by mouth daily. 90 tablet 1  . ibuprofen (ADVIL,MOTRIN) 200 MG tablet Take 600 mg by mouth daily. Pain    . metoprolol (LOPRESSOR) 50 MG tablet Take 1 tablet (50 mg total) by mouth 2 (two) times daily. 180 tablet 1  . Multiple Vitamin (MULTIVITAMIN) tablet Take 1 tablet by mouth daily.    . valsartan-hydrochlorothiazide (DIOVAN-HCT) 160-12.5 MG per tablet Take 1 tablet by mouth daily. 90 tablet 3   No current facility-administered medications on file prior to visit.   Allergies  Allergen Reactions  . Ace Inhibitors Cough    Social History:  reports that she quit smoking about 2 years ago. Her smoking use included Cigarettes. She has a 25 pack-year smoking history. She has never used smokeless tobacco. She reports that she does not drink alcohol or use illicit drugs.  Family History  Problem Relation Age of Onset  . Heart attack Mother   . Hypertension Sister   . Diabetes Brother   . Birth defects Son     heart defect    Review of Systems: Noncontributory  PHYSICAL EXAM: Blood pressure 140/80, weight 350 lb (158.759 kg). General appearance - alert, well appearing, and in no distress Chest - clear to auscultation, no wheezes, rales or rhonchi, symmetric air entry Heart - normal rate (80 bpm) and regular  rhythm Abdomen - soft, nontender, nondistended, no masses or organomegaly Pelvic - examination not indicated; completed previously Extremities - peripheral pulses normal, no pedal edema, no clubbing or cyanosis  Labs: No results found for this or any previous visit (from the past 336 hour(s)).  Imaging Studies:  Imaging Results    US Transvaginal Non-ob  04/08/2014 GYNECOLOGIC SONOGRAM Julia West is a 59 y.o. for a pelvic sonogram for post menopausal bleeding (with clots noted) off and on since October 2015. Uterus Retroverted 7.1 x 4.8 x 4.4 cm, Endometrium 18.3 mm, symmetrical, no obvious solitary mass noted within the endometrial cavity Right ovary 2.9 x 2.6 x 2.1 cm, Left ovary 3.4 x 2.0 x 2.0 cm, No free fluid noted within the pelvis Patient body habitus makes the scan technically difficult Technician Comments: Retroverted UTERUS, ENDOM- 18.44mm, bilateral adnexa/ovaries appear WNL, no free fluid noted Lazarus Gowda 04/08/2014 10:11 AM Clinical Impression and recommendations: I have reviewed the sonogram results above. Combined with the patient's current clinical course, below are my impressions and any appropriate recommendations for management based on the sonographic findings: 1 Abnormally thickened endometrium, recommend endometrial biopsy. 2. Patient reports that she has had prior biopsy in 2012, but current biopsy still advised . No evidence of prior biopsy in EPIC. Nathin Saran V     Assessment: Patient Active Problem List   Diagnosis Date Noted  .  Post-menopausal bleeding 04/15/2014  . Endometrial thickening on ultra sound 04/09/2014  . Ventral hernia 03/06/2012  . Open abdominal wall wound 03/06/2012  . DYSLIPIDEMIA 11/12/2008  . OBESITY, MORBID 11/12/2008  . HYPERTENSION 11/12/2008  . CAD 11/12/2008    Plan: Patient will undergo surgical management  with hysteroscopy, dilation and curettage, removal of endometrial polyp. The risks of surgery were discussed in detail with the patient including but not limited to: bleeding which may require transfusion or reoperation; infection which may require antibiotics; injury to surrounding organs which may involve bowel, bladder, ureters ; need for additional procedures including laparoscopy or laparotomy; thromboembolic phenomenon, surgical site problems and other postoperative/anesthesia complications. Likelihood of success in alleviating the patient's condition was discussed. Routine postoperative instructions will be reviewed with the patient and her family in detail after surgery.   05/06/2014 10:55 AM  This chart was scribed for Jonnie Kind, MD by Tula Nakayama, ED Scribe. This patient was seen in office and the patient's care was started at 10:55 AM.   I personally performed the services described in this documentation, which was scribed in my presence. The recorded information has been reviewed and considered accurate. It has been edited as necessary during review. Jonnie Kind, MD

## 2014-05-14 NOTE — Brief Op Note (Signed)
05/14/2014  8:50 AM  PATIENT:  Julia West  59 y.o. female  PRE-OPERATIVE DIAGNOSIS:  post menopausal bleeding Endometrial polyp  POST-OPERATIVE DIAGNOSIS: postmenopausal  bleeding, endometrial polyps  PROCEDURE:  Procedure(s): ENDOMETRIAL POLYPECTOMY (N/A) DILATATION AND CURETTAGE (no specimen); HYSTEROSCOPY (N/A)  SURGEON:  Surgeon(s) and Role:    * Jonnie Kind, MD - Primary  PHYSICIAN ASSISTANT:   ASSISTANTS: none   ANESTHESIA:   general  EBL:  Total I/O In: 900 [I.V.:900] Out: 0   BLOOD ADMINISTERED:none  DRAINS: none   LOCAL MEDICATIONS USED:  NONE  SPECIMEN:  Source of Specimen:  Endometrial polyps  DISPOSITION OF SPECIMEN:  PATHOLOGY  COUNTS:  YES  TOURNIQUET:  * No tourniquets in log *  DICTATION: .Dragon Dictation  PLAN OF CARE: Discharge to home after PACU  PATIENT DISPOSITION:  PACU - hemodynamically stable.   Delay start of Pharmacological VTE agent (>24hrs) due to surgical blood loss or risk of bleeding: not applicable Details of procedure:. Patient was taken operating room prepped and draped for vaginal procedure after general anesthesia introduced and intubation performed. Timeout was conducted. Ancef was administered 3 g. Speculum was inserted, cervix grasped with single-tooth tenaculum sounded to 9.5 cm, dilated to 23 [place] allowing introduction of the 23 [place] 30 hysteroscope which identified the endometrial cavity as having multiple small polyps. Each individual polyp was amputated at its base and extracted. Photo documentation of the smooth endometrial cavity followed. Smooth curettage obtaining no additional tissue fragments. Tubal ostia were visualized. There was no evidence of complication. Specimen was passed off the table and patient allowed to awaken and go to recovery room. Family was counseled over the outcome of the procedure and pathology report will be pending, to notify family and patient in 1 week

## 2014-05-14 NOTE — Anesthesia Postprocedure Evaluation (Signed)
  Anesthesia Post-op Note  Patient: Julia West  Procedure(s) Performed: Procedure(s): ENDOMETRIAL POLYPECTOMY (N/A) DILATATION AND CURETTAGE (no specimen); HYSTEROSCOPY (N/A)  Patient Location: PACU  Anesthesia Type:General  Level of Consciousness: awake, alert  and oriented  Airway and Oxygen Therapy: Patient Spontanous Breathing  Post-op Pain: none  Post-op Assessment: Post-op Vital signs reviewed, Patient's Cardiovascular Status Stable, Respiratory Function Stable, Patent Airway and No signs of Nausea or vomiting  Post-op Vital Signs: Reviewed and stable  Last Vitals:  Filed Vitals:   05/14/14 0730  BP: 124/54  Pulse:   Temp:   Resp: 15    Complications: No apparent anesthesia complications

## 2014-05-14 NOTE — Discharge Instructions (Signed)
Hysteroscopy Hysteroscopy is a procedure used for looking inside the womb (uterus). It may be done for various reasons, including:  To evaluate abnormal bleeding, fibroid (benign, noncancerous) tumors, polyps, scar tissue (adhesions), and possibly cancer of the uterus.  To look for lumps (tumors) and other uterine growths.  To look for causes of why a woman cannot get pregnant (infertility), causes of recurrent loss of pregnancy (miscarriages), or a lost intrauterine device (IUD).  To perform a sterilization by blocking the fallopian tubes from inside the uterus. In this procedure, a thin, flexible tube with a tiny light and camera on the end of it (hysteroscope) is used to look inside the uterus. A hysteroscopy should be done right after a menstrual period to be sure you are not pregnant. LET [facility] CARE PROVIDER KNOW ABOUT:   Any allergies you have.  All medicines you are taking, including vitamins, herbs, eye drops, creams, and over-the-counter medicines.  Previous problems you or members of your family have had with the use of anesthetics.  Any blood disorders you have.  Previous surgeries you have had.  Medical conditions you have. RISKS AND COMPLICATIONS  Generally, this is a safe procedure. However, as with any procedure, complications can occur. Possible complications include:  Putting a hole in the uterus.  Excessive bleeding.  Infection.  Damage to the cervix.  Injury to other organs.  Allergic reaction to medicines.  Too much fluid used in the uterus for the procedure. BEFORE THE PROCEDURE   Ask your health care provider about changing or stopping any regular medicines.  Do not take aspirin or blood thinners for 1 week before the procedure, or as directed by your health care provider. These can cause bleeding.  If you smoke, do not smoke for 2 weeks before the procedure.  In some cases, a medicine is placed in the cervix the day before the procedure.  This medicine makes the cervix have a larger opening (dilate). This makes it easier for the instrument to be inserted into the uterus during the procedure.  Do not eat or drink anything for at least 8 hours before the surgery.  Arrange for someone to take you home after the procedure. PROCEDURE   You may be given a medicine to relax you (sedative). You may also be given one of the following:  A medicine that numbs the area around the cervix (local anesthetic).  A medicine that makes you sleep through the procedure (general anesthetic).  The hysteroscope is inserted through the vagina into the uterus. The camera on the hysteroscope sends a picture to a TV screen. This gives the surgeon a good view inside the uterus.  During the procedure, air or a liquid is put into the uterus, which allows the surgeon to see better.  Sometimes, tissue is gently scraped from inside the uterus. These tissue samples are sent to a lab for testing. AFTER THE PROCEDURE   If you had a general anesthetic, you may be groggy for a couple hours after the procedure.  If you had a local anesthetic, you will be able to go home as soon as you are stable and feel ready.  You may have some cramping. This normally lasts for a couple days.  You may have bleeding, which varies from light spotting for a few days to menstrual-like bleeding for 3-7 days. This is normal.  If your test results are not back during the visit, make an appointment with your health care provider to find out the  results. Document Released: 06/07/2000 Document Revised: 12/20/2012 Document Reviewed: 09/28/2012 Exit[employer] Patient Information 2015 [employer]. This information is not intended to replace advice given to you by your health care provider. Make sure you discuss any questions you have with your health care provider.   

## 2014-05-14 NOTE — Transfer of Care (Signed)
Immediate Anesthesia Transfer of Care Note  Patient: Julia West  Procedure(s) Performed: Procedure(s): ENDOMETRIAL POLYPECTOMY (N/A) DILATATION AND CURETTAGE (no specimen); HYSTEROSCOPY (N/A)  Patient Location: PACU  Anesthesia Type:General  Level of Consciousness: awake, alert  and oriented  Airway & Oxygen Therapy: Patient Spontanous Breathing and Patient connected to face mask oxygen  Post-op Assessment: Report given to RN  Post vital signs: Reviewed and stable  Last Vitals:  Filed Vitals:   05/14/14 0730  BP: 124/54  Pulse:   Temp:   Resp: 15    Complications: No apparent anesthesia complications

## 2014-05-15 ENCOUNTER — Encounter (HOSPITAL_COMMUNITY): Payer: Self-pay | Admitting: Obstetrics and Gynecology

## 2014-05-15 ENCOUNTER — Encounter: Payer: Self-pay | Admitting: Obstetrics and Gynecology

## 2014-05-15 NOTE — Addendum Note (Signed)
Addendum  created 05/15/14 1029 by Mickel Baas, CRNA   Modules edited: Anesthesia Medication Administration

## 2014-05-16 ENCOUNTER — Telehealth: Payer: Self-pay | Admitting: Obstetrics and Gynecology

## 2014-05-16 NOTE — Telephone Encounter (Signed)
Discuss at followup postop appt.

## 2014-05-16 NOTE — Telephone Encounter (Signed)
Pt aware that PCOS testing is not necessary per Dr. Glo Herring. Pt was also advised that Dr. Glo Herring would discuss her labs at her post op appointment.

## 2014-06-03 ENCOUNTER — Other Ambulatory Visit: Payer: Self-pay | Admitting: Nurse Practitioner

## 2014-06-17 ENCOUNTER — Ambulatory Visit (INDEPENDENT_AMBULATORY_CARE_PROVIDER_SITE_OTHER): Payer: Medicare Other | Admitting: Obstetrics and Gynecology

## 2014-06-17 ENCOUNTER — Encounter: Payer: Self-pay | Admitting: Obstetrics and Gynecology

## 2014-06-17 VITALS — BP 140/84 | Ht 67.0 in | Wt 344.5 lb

## 2014-06-17 DIAGNOSIS — Z9889 Other specified postprocedural states: Secondary | ICD-10-CM

## 2014-06-17 DIAGNOSIS — N840 Polyp of corpus uteri: Secondary | ICD-10-CM | POA: Diagnosis not present

## 2014-06-17 NOTE — Progress Notes (Signed)
Patient ID: Julia West, female   DOB: 24-Oct-1955, 59 y.o.   MRN: [id number]  Subjective:     Julia West is a 59 y.o. female who presents to the clinic 4 weeks status post endometrial polypectomy for pelvic pain. Diet:       regular without difficulty. Bowel function is: normal. Pain:     The patient is not having any pain.   Pt reports 6 lb weight loss after surgery due to lifestyle and diet changes.   The following portions of the patient's history were reviewed and updated as appropriate: allergies, current medications, past family history, past medical history, past social history, past surgical history and problem list.  Review of Systems Pertinent items are noted in HPI.    Objective:    BP 140/84 mmHg  Ht 5\' 7"  (1.702 m)  Wt 344 lb 8 oz (156.264 kg)  BMI 53.94 kg/m2 General:  alert  Abdomen: soft, bowel sounds active, non-tender extensive surgical scars  Incision:   n/a       Pelvic: normal efg, vagina, and cervix.               Bimanual noncontributory    Assessment:  S.p removal of endometrial polyps benign  Doing well postoperatively. Operative findings again reviewed. Pathology report discussed. pt will get ROS in 1 yr , pap and consideration of u/s in 3 yr.   Plan:    1. Continue any current medications. . 3. Activity restrictions: none 4. Anticipated return to work: now. contiue exercise at [facility] 5. Follow up: 1 year for  Ros. Consider u/s at  80yrs.and pap   This chart was scribed for Julia Kind, MD by Tula Nakayama, ED Scribe. This patient was seen in room 1 and the patient's care was started at 10:12 AM.   I personally performed the services described in this documentation, which was SCRIBED in my presence. The recorded information has been reviewed and considered accurate. It has been edited as necessary during review. Julia Kind, MD

## 2014-09-05 ENCOUNTER — Other Ambulatory Visit: Payer: Self-pay

## 2014-09-05 ENCOUNTER — Ambulatory Visit (INDEPENDENT_AMBULATORY_CARE_PROVIDER_SITE_OTHER): Payer: Medicare Other | Admitting: Family Medicine

## 2014-09-05 ENCOUNTER — Encounter: Payer: Self-pay | Admitting: Family Medicine

## 2014-09-05 VITALS — BP 159/79 | HR 57 | Temp 97.1°F | Ht 67.0 in | Wt 341.2 lb

## 2014-09-05 DIAGNOSIS — I10 Essential (primary) hypertension: Secondary | ICD-10-CM | POA: Diagnosis not present

## 2014-09-05 DIAGNOSIS — H6501 Acute serous otitis media, right ear: Secondary | ICD-10-CM

## 2014-09-05 DIAGNOSIS — E785 Hyperlipidemia, unspecified: Secondary | ICD-10-CM | POA: Diagnosis not present

## 2014-09-05 DIAGNOSIS — M5431 Sciatica, right side: Secondary | ICD-10-CM

## 2014-09-05 DIAGNOSIS — M25561 Pain in right knee: Secondary | ICD-10-CM

## 2014-09-05 LAB — POCT CBC
GRANULOCYTE PERCENT: 63.9 % (ref 37–80)
HEMATOCRIT: 47.2 % (ref 37.7–47.9)
HEMOGLOBIN: 14.5 g/dL (ref 12.2–16.2)
Lymph, poc: 3.7 — AB (ref 0.6–3.4)
MCH, POC: 26.4 pg — AB (ref 27–31.2)
MCHC: 30.7 g/dL — AB (ref 31.8–35.4)
MCV: 86 fL (ref 80–97)
MPV: 9.5 fL (ref 0–99.8)
POC Granulocyte: 7.3 — AB (ref 2–6.9)
POC LYMPH PERCENT: 32.5 %L (ref 10–50)
Platelet Count, POC: 257 10*3/uL (ref 142–424)
RBC: 5.49 M/uL — AB (ref 4.04–5.48)
RDW, POC: 16.8 %
WBC: 11.5 10*3/uL — AB (ref 4.6–10.2)

## 2014-09-05 MED ORDER — PREDNISONE 10 MG PO TABS: ORAL_TABLET | ORAL | Status: DC

## 2014-09-05 MED ORDER — CEFUROXIME AXETIL 500 MG PO TABS: 500.0000 mg | ORAL_TABLET | Freq: Two times a day (BID) | ORAL | Status: DC

## 2014-09-05 MED ORDER — FUROSEMIDE 20 MG PO TABS: ORAL_TABLET | ORAL | Status: DC

## 2014-09-05 NOTE — Progress Notes (Signed)
Subjective:  Patient ID: Julia West, female    DOB: 1955/07/03  Age: 59 y.o. MRN: [id number]  CC: Medication Refill; Ear Problem; Sciatica; and Knee Pain   HPI SHAELYNN DRAGOS presents for right knee pain. She is having increasing pain of 6/10. This responded well to injection 3 months ago and she would like to have the injection again today. The pain is beginning to slow down her ability to ambulate. She is also having discomfort in the right ear. She denies any changes in hearing. There is a full sensation. Additionally her sciatica has been flaring up. Pain radiates down the right posterior thigh from the buttocks. Kilos just past the knee. Pain is 5-7/10. Worsened with ambulation.  History Doni has a past medical history of Morbid obesity; Other and unspecified hyperlipidemia; Unspecified essential hypertension; Acute myocardial infarction; Degenerative joint disease; Internal and external hemorrhoids without complication; Anxiety; Coronary artery disease; Complication of anesthesia; Surgical wound, non healing (ABDOMINAL ); Degenerative joint disease; and Lymphedema of arm.   She has past surgical history that includes Cesarean section; Cholecystectomy; Coronary artery bypass graft (2003); Incisional hernia repair (02/07/2012); Bowel resection (02/07/2012); Application if wound vac; Incision and drainage of wound (04/17/2012); Lesion removal (04/17/2012); Incision and drainage of wound (N/A, 04/24/2012); Incision and drainage of wound (N/A, 05/01/2012); Incision and drainage of wound (N/A, 05/08/2012); Cardiovascular stress test (01-17-2012  [facility]); Incision and drainage of wound (N/A, 05/15/2012); Incision and drainage of wound (N/A, 05/22/2012); Incision and drainage of wound (N/A, 06/28/2012); polypectomy (N/A, 05/14/2014); and Hysteroscopy w/D&C (N/A, 05/14/2014).   Her family history includes Birth defects in her son; Diabetes in her brother; Heart attack in her mother; Hypertension  in her sister.She reports that she quit smoking about 2 years ago. Her smoking use included Cigarettes. She has a 25 pack-year smoking history. She has never used smokeless tobacco. She reports that she does not drink alcohol or use illicit drugs.  Outpatient Prescriptions Prior to Visit  Medication Sig Dispense Refill  . aspirin 81 MG tablet Take 81 mg by mouth daily.     . fish oil-omega-3 fatty acids 1000 MG capsule Take 2 g by mouth daily.      Marland Kitchen ibuprofen (ADVIL,MOTRIN) 200 MG tablet Take 600 mg by mouth daily as needed for moderate pain. Pain    . metoprolol (LOPRESSOR) 50 MG tablet Take 1 tablet (50 mg total) by mouth 2 (two) times daily. 180 tablet 1  . Multiple Vitamin (MULTIVITAMIN) tablet Take 1 tablet by mouth daily.    . valsartan-hydrochlorothiazide (DIOVAN-HCT) 160-12.5 MG per tablet Take 1 tablet by mouth daily. 90 tablet 3  . furosemide (LASIX) 20 MG tablet TAKE 1 TABLET (20 MG TOTAL) BY MOUTH DAILY. 90 tablet 0  . clobetasol cream (TEMOVATE) 9.16 % Apply 1 application topically 2 (two) times daily. 30 g 0   No facility-administered medications prior to visit.    ROS Review of Systems  Constitutional: Negative for fever, chills, diaphoresis, appetite change, fatigue and unexpected weight change.  HENT: Negative for congestion, ear pain, hearing loss, postnasal drip, rhinorrhea, sneezing, sore throat and trouble swallowing.   Eyes: Negative for pain.  Respiratory: Negative for cough, chest tightness and shortness of breath.   Cardiovascular: Negative for chest pain and palpitations.  Gastrointestinal: Negative for nausea, vomiting, abdominal pain, diarrhea and constipation.  Genitourinary: Negative for dysuria, frequency and menstrual problem.  Musculoskeletal: Negative for joint swelling and arthralgias.  Skin: Negative for rash.  Neurological: Negative for dizziness,  weakness, numbness and headaches.  Psychiatric/Behavioral: Negative for dysphoric mood and agitation.     Objective:  BP 159/79 mmHg  Pulse 57  Temp(Src) 97.1 F (36.2 C) (Oral)  Ht _0  (1.702 m)  Wt 341 lb 3.2 oz (154.767 kg)  BMI 53.43 kg/m2  BP Readings from Last 3 Encounters:  09/05/14 159/79  06/17/14 140/84  05/14/14 120/51    Wt Readings from Last 3 Encounters:  09/05/14 341 lb 3.2 oz (154.767 kg)  06/17/14 344 lb 8 oz (156.264 kg)  05/14/14 350 lb (158.759 kg)     Physical Exam  Constitutional: She is oriented to person, place, and time. She appears well-developed and well-nourished. No distress.  HENT:  Head: Normocephalic and atraumatic.  Right Ear: External ear normal.  Left Ear: External ear normal.  Nose: Nose normal.  Mouth/Throat: Oropharynx is clear and moist.  Eyes: Conjunctivae and EOM are normal. Pupils are equal, round, and reactive to light.  Neck: Normal range of motion. Neck supple. No thyromegaly present.  Cardiovascular: Normal rate, regular rhythm and normal heart sounds.   No murmur heard. Pulmonary/Chest: Effort normal and breath sounds normal. No respiratory distress. She has no wheezes. She has no rales.  Abdominal: Soft. Bowel sounds are normal. She exhibits no distension. There is no tenderness.  Lymphadenopathy:    She has no cervical adenopathy.  Neurological: She is alert and oriented to person, place, and time. She has normal reflexes.  Skin: Skin is warm and dry.  Psychiatric: She has a normal mood and affect. Her behavior is normal. Judgment and thought content normal.    No results found for: HGBA1C  Lab Results  Component Value Date   WBC 11.5* 09/05/2014   HGB 14.5 09/05/2014   HCT 47.2 09/05/2014   PLT 250 05/10/2014   GLUCOSE 93 09/05/2014   CHOL 177 09/05/2014   TRIG 127 09/05/2014   HDL 46 09/05/2014   LDLCALC 106* 09/05/2014   ALT 10 09/05/2014   AST 16 09/05/2014   NA 141 09/05/2014   K 4.7 09/05/2014   CL 100 09/05/2014   CREATININE 1.00 09/05/2014   BUN 16 09/05/2014   CO2 25 09/05/2014   TSH 1.140  01/12/2013    No results found.  Assessment & Plan:   Esmae was seen today for medication refill, ear problem, sciatica and knee pain.  Diagnoses and all orders for this visit:  Right knee pain Orders: -     POCT CBC -     CMP14+EGFR -     Lipid panel  OBESITY, MORBID Orders: -     POCT CBC -     CMP14+EGFR -     Lipid panel  Essential hypertension Orders: -     POCT CBC -     CMP14+EGFR -     Lipid panel  Hyperlipidemia Orders: -     Lipid panel  Sciatica, right  Right acute serous otitis media, recurrence not specified  Other orders -     predniSONE (DELTASONE) 10 MG tablet; Take 5 daily for 3 days followed by 4,3,2 and 1 for 3 days each. -     cefUROXime (CEFTIN) 500 MG tablet; Take 1 tablet (500 mg total) by mouth 2 (two) times daily with a meal.   I have discontinued Ms. Mulvaney's clobetasol cream. I am also having her start on predniSONE and cefUROXime. Additionally, I am having her maintain her aspirin, fish oil-omega-3 fatty acids, ibuprofen, valsartan-hydrochlorothiazide, metoprolol, multivitamin, and fenofibrate.  Meds  ordered this encounter  Medications  . fenofibrate (TRICOR) 145 MG tablet    Sig: Take 1 tablet by mouth daily.    Refill:  5  . predniSONE (DELTASONE) 10 MG tablet    Sig: Take 5 daily for 3 days followed by 4,3,2 and 1 for 3 days each.    Dispense:  45 tablet    Refill:  0  . cefUROXime (CEFTIN) 500 MG tablet    Sig: Take 1 tablet (500 mg total) by mouth 2 (two) times daily with a meal.    Dispense:  20 tablet    Refill:  0   A steroid injection was performed at after sterile prep lateral aspect of the right knee using 1% plain Lidocaine and 12 mg of Celestone. This was well tolerated.   Follow-up: Return in about 3 months (around 12/06/2014).  [name], M.D.

## 2014-09-06 LAB — CMP14+EGFR
ALBUMIN: 4.4 g/dL (ref 3.6–4.8)
ALT: 10 IU/L (ref 0–32)
AST: 16 IU/L (ref 0–40)
Albumin/Globulin Ratio: 1.5 (ref 1.1–2.5)
Alkaline Phosphatase: 48 IU/L (ref 39–117)
BILIRUBIN TOTAL: 0.4 mg/dL (ref 0.0–1.2)
BUN/Creatinine Ratio: 16 (ref 11–26)
BUN: 16 mg/dL (ref 8–27)
CALCIUM: 9.5 mg/dL (ref 8.7–10.3)
CHLORIDE: 100 mmol/L (ref 97–108)
CO2: 25 mmol/L (ref 18–29)
Creatinine, Ser: 1 mg/dL (ref 0.57–1.00)
GFR calc Af Amer: 70 mL/min/{1.73_m2} (ref >59)
GFR, EST NON AFRICAN AMERICAN: 61 mL/min/{1.73_m2} (ref >59)
GLOBULIN, TOTAL: 3 g/dL (ref 1.5–4.5)
GLUCOSE: 93 mg/dL (ref 65–99)
Potassium: 4.7 mmol/L (ref 3.5–5.2)
Sodium: 141 mmol/L (ref 134–144)
TOTAL PROTEIN: 7.4 g/dL (ref 6.0–8.5)

## 2014-09-06 LAB — LIPID PANEL
Chol/HDL Ratio: 3.8 ratio units (ref 0.0–4.4)
Cholesterol, Total: 177 mg/dL (ref 100–199)
HDL: 46 mg/dL (ref >39)
LDL Calculated: 106 mg/dL — ABNORMAL HIGH (ref 0–99)
Triglycerides: 127 mg/dL (ref 0–149)
VLDL Cholesterol Cal: 25 mg/dL (ref 5–40)

## 2014-09-17 ENCOUNTER — Other Ambulatory Visit: Payer: Self-pay | Admitting: Family Medicine

## 2014-09-17 NOTE — Telephone Encounter (Signed)
Last seen 09/05/14  Dr Livia Snellen

## 2014-09-18 ENCOUNTER — Telehealth: Payer: Self-pay | Admitting: Family Medicine

## 2014-09-18 ENCOUNTER — Other Ambulatory Visit: Payer: Self-pay

## 2014-09-18 DIAGNOSIS — I10 Essential (primary) hypertension: Secondary | ICD-10-CM

## 2014-09-18 MED ORDER — METOPROLOL TARTRATE 50 MG PO TABS: 50.0000 mg | ORAL_TABLET | Freq: Two times a day (BID) | ORAL | Status: DC

## 2014-09-18 MED ORDER — FENOFIBRATE 145 MG PO TABS: 145.0000 mg | ORAL_TABLET | Freq: Every day | ORAL | Status: DC

## 2014-09-18 MED ORDER — VALSARTAN-HYDROCHLOROTHIAZIDE 160-12.5 MG PO TABS: 1.0000 | ORAL_TABLET | Freq: Every day | ORAL | Status: DC

## 2014-09-18 MED ORDER — FUROSEMIDE 20 MG PO TABS: ORAL_TABLET | ORAL | Status: DC

## 2014-10-09 ENCOUNTER — Telehealth: Payer: Self-pay | Admitting: Family Medicine

## 2014-10-09 NOTE — Telephone Encounter (Signed)
Patient aware that the prednisone was a one time medication and that she is not suppose to have it refilled.

## 2014-10-23 ENCOUNTER — Telehealth: Payer: Self-pay | Admitting: Family Medicine

## 2014-10-23 MED ORDER — PREDNISONE 10 MG PO TABS: ORAL_TABLET | ORAL | Status: DC

## 2014-10-23 NOTE — Telephone Encounter (Signed)
Go ahead with the refill of the Dosepak as in July. If her symptoms continue after that I would like to see her in follow-up for more long-term management.

## 2014-10-23 NOTE — Telephone Encounter (Signed)
TC pt finished the prednisone back from first of July, she felt really good with this and was able to move about better, now that she has been off of it her back pain is keeping her up at night. She is wondering if she can get a refill on the prednisone or is there another medication that can be called in for her back pain/arthritis. Has her husbands birthday celebration this weekend and is just needing to be able to get around. [facility]

## 2014-10-23 NOTE — Telephone Encounter (Signed)
Patient aware and has appointment in September

## 2014-11-20 ENCOUNTER — Ambulatory Visit: Payer: Medicare Other | Admitting: Cardiology

## 2014-11-30 ENCOUNTER — Other Ambulatory Visit: Payer: Self-pay | Admitting: Nurse Practitioner

## 2014-12-04 ENCOUNTER — Telehealth: Payer: Self-pay | Admitting: Family Medicine

## 2014-12-04 MED ORDER — VALSARTAN-HYDROCHLOROTHIAZIDE 160-12.5 MG PO TABS: 1.0000 | ORAL_TABLET | Freq: Every day | ORAL | Status: DC

## 2014-12-04 NOTE — Telephone Encounter (Signed)
done

## 2014-12-09 ENCOUNTER — Ambulatory Visit (INDEPENDENT_AMBULATORY_CARE_PROVIDER_SITE_OTHER): Payer: Medicare Other | Admitting: Family Medicine

## 2014-12-09 ENCOUNTER — Encounter: Payer: Self-pay | Admitting: Family Medicine

## 2014-12-09 VITALS — BP 139/58 | HR 57 | Temp 97.1°F | Ht 67.0 in | Wt 348.8 lb

## 2014-12-09 DIAGNOSIS — I10 Essential (primary) hypertension: Secondary | ICD-10-CM

## 2014-12-09 DIAGNOSIS — M129 Arthropathy, unspecified: Secondary | ICD-10-CM

## 2014-12-09 DIAGNOSIS — E785 Hyperlipidemia, unspecified: Secondary | ICD-10-CM | POA: Diagnosis not present

## 2014-12-09 DIAGNOSIS — G4700 Insomnia, unspecified: Secondary | ICD-10-CM | POA: Diagnosis not present

## 2014-12-09 DIAGNOSIS — M25561 Pain in right knee: Secondary | ICD-10-CM

## 2014-12-09 DIAGNOSIS — R5383 Other fatigue: Secondary | ICD-10-CM | POA: Diagnosis not present

## 2014-12-09 DIAGNOSIS — M1710 Unilateral primary osteoarthritis, unspecified knee: Secondary | ICD-10-CM

## 2014-12-09 MED ORDER — BETAMETHASONE 0.6 MG/5ML PO SOLN: 1.2000 mg | Freq: Two times a day (BID) | ORAL | Status: DC

## 2014-12-09 MED ORDER — TRAZODONE HCL 150 MG PO TABS: ORAL_TABLET | ORAL | Status: DC

## 2014-12-09 MED ORDER — PHENTERMINE HCL 37.5 MG PO CAPS: 37.5000 mg | ORAL_CAPSULE | ORAL | Status: DC

## 2014-12-09 NOTE — Progress Notes (Signed)
Subjective:  Patient ID: Julia West, female    DOB: 01-Feb-1956  Age: 59 y.o. MRN: [id number]  CC: Hypertension and Hyperlipidemia   HPI Julia West presents for  follow-up of hypertension. Patient has no history of headache chest pain or shortness of breath or recent cough. Patient also denies symptoms of TIA such as numbness weakness lateralizing. Patient checks  blood pressure at home and has not had any elevated readings recently. Patient denies side effects from his medication. States taking it regularly.  Patient also  in for follow-up of elevated cholesterol. Doing well without complaints on current medication. Denies side effects of statin including myalgia and arthralgia and nausea. Also in today for liver function testing. Currently no chest pain, shortness of breath or other cardiovascular related symptoms noted.  Pain in right knee persists. Moderately severe. She states that she is trying to lose weight. She is concerned about taking prednisone because it makes her very hungry. Requests steroid injection.    History [name] has a past medical history of Morbid obesity; Other and unspecified hyperlipidemia; Unspecified essential hypertension; Acute myocardial infarction; Degenerative joint disease; Internal and external hemorrhoids without complication; Anxiety; Coronary artery disease; Complication of anesthesia; Surgical wound, non healing (ABDOMINAL ); Degenerative joint disease; and Lymphedema of arm.   She has past surgical history that includes Cesarean section; Cholecystectomy; Coronary artery bypass graft (2003); Incisional hernia repair (02/07/2012); Bowel resection (02/07/2012); Application if wound vac; Incision and drainage of wound (04/17/2012); Lesion removal (04/17/2012); Incision and drainage of wound (N/A, 04/24/2012); Incision and drainage of wound (N/A, 05/01/2012); Incision and drainage of wound (N/A, 05/08/2012); Cardiovascular stress test (01-17-2012  [facility]); Incision and drainage of wound (N/A, 05/15/2012); Incision and drainage of wound (N/A, 05/22/2012); Incision and drainage of wound (N/A, 06/28/2012); polypectomy (N/A, 05/14/2014); and Hysteroscopy w/D&C (N/A, 05/14/2014).   Her family history includes Birth defects in her son; Diabetes in her brother; Heart attack in her mother; Hypertension in her sister.She reports that she quit smoking about 3 years ago. Her smoking use included Cigarettes. She has a 25 pack-year smoking history. She has never used smokeless tobacco. She reports that she does not drink alcohol or use illicit drugs.  Current Outpatient Prescriptions on File Prior to Visit  Medication Sig Dispense Refill  . aspirin 81 MG tablet Take 81 mg by mouth daily.     . fenofibrate (TRICOR) 145 MG tablet Take 1 tablet (145 mg total) by mouth daily. 90 tablet 1  . fish oil-omega-3 fatty acids 1000 MG capsule Take 2 g by mouth daily.      . furosemide (LASIX) 20 MG tablet TAKE 1 TABLET (20 MG TOTAL) BY MOUTH DAILY. 90 tablet 1  . ibuprofen (ADVIL,MOTRIN) 200 MG tablet Take 600 mg by mouth daily as needed for moderate pain. Pain    . metoprolol (LOPRESSOR) 50 MG tablet Take 1 tablet (50 mg total) by mouth 2 (two) times daily. 180 tablet 1  . Multiple Vitamin (MULTIVITAMIN) tablet Take 1 tablet by mouth daily.    . valsartan-hydrochlorothiazide (DIOVAN-HCT) 160-12.5 MG per tablet Take 1 tablet by mouth daily. 90 tablet 1   No current facility-administered medications on file prior to visit.    ROS Review of Systems  Constitutional: Negative for fever, chills, diaphoresis, appetite change, fatigue and unexpected weight change.  HENT: Negative for congestion, ear pain, hearing loss, postnasal drip, rhinorrhea, sneezing, sore throat and trouble swallowing.   Eyes: Negative for pain.  Respiratory: Negative for cough,  chest tightness and shortness of breath.   Cardiovascular: Negative for chest pain and palpitations.  Gastrointestinal:  Negative for nausea, vomiting, abdominal pain, diarrhea and constipation.  Genitourinary: Negative for dysuria, frequency and menstrual problem.  Musculoskeletal: Positive for myalgias, joint swelling and arthralgias.  Skin: Negative for rash.  Neurological: Negative for dizziness, weakness, numbness and headaches.  Psychiatric/Behavioral: Negative for dysphoric mood and agitation.    Objective:  BP 139/58 mmHg  Pulse 57  Temp(Src) 97.1 F (36.2 C) (Oral)  Ht $R'5\' 7"'KH$  (1.702 m)  Wt 348 lb 12.8 oz (158.215 kg)  BMI 54.62 kg/m2  BP Readings from Last 3 Encounters:  12/09/14 139/58  09/05/14 159/79  06/17/14 140/84    Wt Readings from Last 3 Encounters:  12/09/14 348 lb 12.8 oz (158.215 kg)  09/05/14 341 lb 3.2 oz (154.767 kg)  06/17/14 344 lb 8 oz (156.264 kg)     Physical Exam  Constitutional: She is oriented to person, place, and time. She appears well-developed and well-nourished. No distress.  HENT:  Head: Normocephalic and atraumatic.  Right Ear: External ear normal.  Left Ear: External ear normal.  Nose: Nose normal.  Mouth/Throat: Oropharynx is clear and moist.  Eyes: Conjunctivae and EOM are normal. Pupils are equal, round, and reactive to light.  Neck: Normal range of motion. Neck supple. No thyromegaly present.  Cardiovascular: Normal rate, regular rhythm and normal heart sounds.   No murmur heard. Pulmonary/Chest: Effort normal and breath sounds normal. No respiratory distress. She has no wheezes. She has no rales.  Abdominal: Soft. Bowel sounds are normal. She exhibits no distension. There is no tenderness.  Lymphadenopathy:    She has no cervical adenopathy.  Neurological: She is alert and oriented to person, place, and time. She has normal reflexes.  Skin: Skin is warm and dry.  Psychiatric: She has a normal mood and affect. Her behavior is normal. Judgment and thought content normal.    No results found for: HGBA1C  Lab Results  Component Value Date    WBC 11.5* 09/05/2014   HGB 14.5 09/05/2014   HCT 47.2 09/05/2014   PLT 250 05/10/2014   GLUCOSE 93 09/05/2014   CHOL 177 09/05/2014   TRIG 127 09/05/2014   HDL 46 09/05/2014   LDLCALC 106* 09/05/2014   ALT 10 09/05/2014   AST 16 09/05/2014   NA 141 09/05/2014   K 4.7 09/05/2014   CL 100 09/05/2014   CREATININE 1.00 09/05/2014   BUN 16 09/05/2014   CO2 25 09/05/2014   TSH 1.140 01/12/2013    No results found.  Assessment & Plan:   Celsa was seen today for hypertension and hyperlipidemia.  Diagnoses and all orders for this visit:  Essential hypertension -     CMP14+EGFR  Hyperlipemia -     Lipid panel  Other fatigue -     CBC with Differential/Platelet -     TSH -     T4, Free  Arthritis of knee -     PR DRAIN/INJECT LARGE JOINT/BURSA  Insomnia  Other orders -     Discontinue: betamethasone (CELESTONE) 0.6 MG/5ML solution; Take 10 mLs (1.2 mg total) by mouth 2 (two) times daily. -     phentermine 37.5 MG capsule; Take 1 capsule (37.5 mg total) by mouth every morning. -     traZODone (DESYREL) 150 MG tablet; 1 or 2 at bedtime for sleep   I have discontinued Ms. Daoust's cefUROXime, predniSONE, and betamethasone. I am also having her start  on phentermine and traZODone. Additionally, I am having her maintain her aspirin, fish oil-omega-3 fatty acids, ibuprofen, multivitamin, fenofibrate, furosemide, valsartan-hydrochlorothiazide, and metoprolol.  Meds ordered this encounter  Medications  . DISCONTD: betamethasone (CELESTONE) 0.6 MG/5ML solution    Sig: Take 10 mLs (1.2 mg total) by mouth 2 (two) times daily.    Dispense:  118 mL    Refill:  0  . phentermine 37.5 MG capsule    Sig: Take 1 capsule (37.5 mg total) by mouth every morning.    Dispense:  30 capsule    Refill:  2  . traZODone (DESYREL) 150 MG tablet    Sig: 1 or 2 at bedtime for sleep    Dispense:  60 tablet    Refill:  2   A steroid injection was performed at the lateral joint space of the  right knee using 1% plain Lidocaine and 9 mg of Celestone. This was well tolerated.   Follow-up: Return in about 3 months (around 03/10/2015).  [name], M.D.

## 2014-12-10 LAB — CMP14+EGFR
A/G RATIO: 1.6 (ref 1.1–2.5)
ALT: 12 IU/L (ref 0–32)
AST: 12 IU/L (ref 0–40)
Albumin: 3.9 g/dL (ref 3.6–4.8)
Alkaline Phosphatase: 42 IU/L (ref 39–117)
BUN/Creatinine Ratio: 21 (ref 11–26)
BUN: 20 mg/dL (ref 8–27)
Bilirubin Total: 0.4 mg/dL (ref 0.0–1.2)
CALCIUM: 9.5 mg/dL (ref 8.7–10.3)
CHLORIDE: 100 mmol/L (ref 97–108)
CO2: 25 mmol/L (ref 18–29)
Creatinine, Ser: 0.95 mg/dL (ref 0.57–1.00)
GFR, EST AFRICAN AMERICAN: 75 mL/min/{1.73_m2} (ref >59)
GFR, EST NON AFRICAN AMERICAN: 65 mL/min/{1.73_m2} (ref >59)
GLOBULIN, TOTAL: 2.5 g/dL (ref 1.5–4.5)
Glucose: 123 mg/dL — ABNORMAL HIGH (ref 65–99)
POTASSIUM: 4.1 mmol/L (ref 3.5–5.2)
SODIUM: 140 mmol/L (ref 134–144)
Total Protein: 6.4 g/dL (ref 6.0–8.5)

## 2014-12-10 LAB — CBC WITH DIFFERENTIAL/PLATELET
Basophils Absolute: 0.1 10*3/uL (ref 0.0–0.2)
Basos: 0 %
EOS (ABSOLUTE): 0.3 10*3/uL (ref 0.0–0.4)
EOS: 3 %
HEMATOCRIT: 43.5 % (ref 34.0–46.6)
Hemoglobin: 14 g/dL (ref 11.1–15.9)
IMMATURE GRANULOCYTES: 0 %
Immature Grans (Abs): 0 10*3/uL (ref 0.0–0.1)
LYMPHS ABS: 3.2 10*3/uL — AB (ref 0.7–3.1)
Lymphs: 28 %
MCH: 28.7 pg (ref 26.6–33.0)
MCHC: 32.2 g/dL (ref 31.5–35.7)
MCV: 89 fL (ref 79–97)
Monocytes Absolute: 0.9 10*3/uL (ref 0.1–0.9)
Monocytes: 8 %
NEUTROS ABS: 6.9 10*3/uL (ref 1.4–7.0)
Neutrophils: 61 %
Platelets: 320 10*3/uL (ref 150–379)
RBC: 4.88 x10E6/uL (ref 3.77–5.28)
RDW: 15.6 % — ABNORMAL HIGH (ref 12.3–15.4)
WBC: 11.4 10*3/uL — ABNORMAL HIGH (ref 3.4–10.8)

## 2014-12-10 LAB — TSH: TSH: 0.414 u[IU]/mL — AB (ref 0.450–4.500)

## 2014-12-10 LAB — LIPID PANEL
CHOL/HDL RATIO: 4.2 ratio (ref 0.0–4.4)
Cholesterol, Total: 147 mg/dL (ref 100–199)
HDL: 35 mg/dL — ABNORMAL LOW (ref >39)
LDL Calculated: 79 mg/dL (ref 0–99)
TRIGLYCERIDES: 164 mg/dL — AB (ref 0–149)
VLDL Cholesterol Cal: 33 mg/dL (ref 5–40)

## 2014-12-10 LAB — T4, FREE: Free T4: 1.25 ng/dL (ref 0.82–1.77)

## 2014-12-11 ENCOUNTER — Telehealth: Payer: Self-pay | Admitting: Family Medicine

## 2014-12-11 NOTE — Telephone Encounter (Signed)
Pt notified of results Verbalizes understanding 

## 2014-12-11 NOTE — Telephone Encounter (Signed)
-----   Message from [name], MD sent at 12/10/2014  2:02 PM EDT ----- Alleen Borne,    Your lab result is normal.Some minor variations that are not significant are commonly marked abnormal, but do not represent any medical problem for you.  Best regards, [name], M.D.

## 2014-12-14 ENCOUNTER — Other Ambulatory Visit: Payer: Self-pay | Admitting: Family Medicine

## 2014-12-18 ENCOUNTER — Ambulatory Visit (INDEPENDENT_AMBULATORY_CARE_PROVIDER_SITE_OTHER): Payer: Medicare Other | Admitting: Cardiology

## 2014-12-18 ENCOUNTER — Encounter: Payer: Self-pay | Admitting: Cardiology

## 2014-12-18 VITALS — BP 120/62 | HR 60 | Ht 66.0 in | Wt 338.0 lb

## 2014-12-18 DIAGNOSIS — I10 Essential (primary) hypertension: Secondary | ICD-10-CM

## 2014-12-18 NOTE — Progress Notes (Signed)
HPI The patient presents for follow of CAD.  She has stress perfusion study in 2013 it was low risk demonstrating an potential small distal inferolateral ischemic defect. However, this was managed medically.  Since I last saw her she has done well.  The patient denies any new symptoms such as chest discomfort, neck or arm discomfort. There has been no new shortness of breath, PND or orthopnea. There have been no reported palpitations, presyncope or syncope.  She does walk for exercise.   Allergies  Allergen Reactions  . Ace Inhibitors Cough    Current Outpatient Prescriptions  Medication Sig Dispense Refill  . aspirin 81 MG tablet Take 81 mg by mouth daily.     . fenofibrate (TRICOR) 145 MG tablet Take 1 tablet by mouth  daily 90 tablet 1  . fish oil-omega-3 fatty acids 1000 MG capsule Take 2 g by mouth daily.      . furosemide (LASIX) 20 MG tablet TAKE 1 TABLET (20 MG TOTAL) BY MOUTH DAILY. 90 tablet 1  . ibuprofen (ADVIL,MOTRIN) 200 MG tablet Take 600 mg by mouth daily as needed for moderate pain. Pain    . metoprolol (LOPRESSOR) 50 MG tablet Take 1 tablet (50 mg total) by mouth 2 (two) times daily. 180 tablet 1  . Multiple Vitamin (MULTIVITAMIN) tablet Take 1 tablet by mouth daily.    . traZODone (DESYREL) 150 MG tablet 1 or 2 at bedtime for sleep (Patient taking differently: Take 150 mg by mouth. 1 or 2 at bedtime for sleep) 60 tablet 2  . traZODone (DESYREL) 150 MG tablet 150 mg. Take 1 to 2 by mouth daily as needed for sleep    . valsartan-hydrochlorothiazide (DIOVAN-HCT) 160-12.5 MG per tablet Take 1 tablet by mouth daily. 90 tablet 1  . phentermine 37.5 MG capsule Take 1 capsule (37.5 mg total) by mouth every morning. (Patient not taking: Reported on 12/18/2014) 30 capsule 2   No current facility-administered medications for this visit.    Past Medical History  Diagnosis Date  . Morbid obesity ([place])   . Other and unspecified hyperlipidemia   . Unspecified essential  hypertension   . Acute myocardial infarction     with a ruptured plaque in the circumflex in 2003  . Degenerative joint disease   . Internal and external hemorrhoids without complication   . Anxiety   . Coronary artery disease   . Complication of anesthesia     states low O2 sats post-op 11/13  . Surgical wound, non healing ABDOMINAL     has wound vac @ 125 mm Hg  . Degenerative joint disease   . Lymphedema of arm     right    Past Surgical History  Procedure Laterality Date  . Cesarean section      x 4 in remote past  . Cholecystectomy    . Coronary artery bypass graft  2003    by Dr. Servando Snare. LIMA to the LAD, free RIMA to the circumflex. Stress perfusion study December 2009 with no high-risk areas of ischemia. She has a well-preserved ejection fraction  . Incisional hernia repair  02/07/2012    Procedure: HERNIA REPAIR INCISIONAL;  Surgeon: Gayland Curry, MD,FACS;  Location: [place];  Service: General;;  Open, Primary repair, strangulated Incisional hernia.  . Bowel resection  02/07/2012    Procedure: SMALL BOWEL RESECTION;  Surgeon: Gayland Curry, MD,FACS;  Location: [place];  Service: General;;  . Application of wound vac    .  Incision and drainage of wound  04/17/2012    Procedure: IRRIGATION AND DEBRIDEMENT WOUND;  Surgeon: Theodoro Kos, DO;  Location: [place];  Service: Plastics;  Laterality: N/A;  OF ABDOMINAL WOUND, SURGICAL PREP AND PLACEMENT OF VAC, REMOVAL FOREHEAD SKIN LESION  . Lesion removal  04/17/2012    Procedure: LESION REMOVAL;  Surgeon: Theodoro Kos, DO;  Location: [place];  Service: Plastics;  Laterality: N/A;   CENTER OF FOREHEAD, REMOVAL FORHEAD SKIN LESION  . Incision and drainage of wound N/A 04/24/2012    Procedure: IRRIGATION AND DEBRIDEMENT WOUND;  Surgeon: Theodoro Kos, DO;  Location: [place];  Service: Plastics;  Laterality: N/A;  I & D ABDOMINAL WOUND WITH VAC AND ACELL  . Incision and drainage of  wound N/A 05/01/2012    Procedure: IRRIGATION AND DEBRIDEMENT WOUND;  Surgeon: Theodoro Kos, DO;  Location: [place];  Service: Plastics;  Laterality: N/A;  WITH SURGICAL PREP AND PLACEMENT OF VAC  . Incision and drainage of wound N/A 05/08/2012    Procedure: IRRIGATION AND DEBRIDEMENT OF ABD WOUND SURGICAL PREP AND PLACEMENT OF VAC ;  Surgeon: Theodoro Kos, DO;  Location: [place];  Service: Plastics;  Laterality: N/A;  IRRIGATION AND DEBRIDEMENT OF ABD WOUND SURGICAL PREP AND PLACEMENT OF VAC   . Cardiovascular stress test  01-17-2012  DR Rolondo Pierre    LOW RISK NUCLEAR TEST  . Incision and drainage of wound N/A 05/15/2012    Procedure: IRRIGATION AND DEBRIDEMENT OF ABDOMINAL WOUND WITH POSSIBLE SURGICAL PREP AND PLACEMENT OF VAC;  Surgeon: Theodoro Kos, DO;  Location: [place];  Service: Plastics;  Laterality: N/A;  . Incision and drainage of wound N/A 05/22/2012    Procedure: IRRIGATION AND DEBRIDEMENT OF ABODOMINAL WOUND WITH  SURGICAL PREP AND VAC PLACEMENT;  Surgeon: Theodoro Kos, DO;  Location: [place];  Service: Plastics;  Laterality: N/A;  . Incision and drainage of wound N/A 06/28/2012    Procedure: IRRIGATION AND DEBRIDEMENT OF ABDOMINAL ULCER SURGICAL PREP AND PLACEMENT OF ACELL AND VAC;  Surgeon: Theodoro Kos, DO;  Location: [place];  Service: Plastics;  Laterality: N/A;  . Polypectomy N/A 05/14/2014    Procedure: ENDOMETRIAL POLYPECTOMY;  Surgeon: Jonnie Kind, MD;  Location: [facility];  Service: Gynecology;  Laterality: N/A;  . Hysteroscopy w/d&c N/A 05/14/2014    Procedure: DILATATION AND CURETTAGE (no specimen); HYSTEROSCOPY;  Surgeon: Jonnie Kind, MD;  Location: [facility];  Service: Gynecology;  Laterality: N/A;    ROS:  As stated in the HPI and negative for all other systems.  PHYSICAL EXAM BP 120/62 mmHg  Pulse 60  Ht 5\' 6"  (1.676 m)  Wt 338 lb (153.316 kg)  BMI 54.58 kg/m2 GENERAL:   Well appearing NECK:  No jugular venous distention, waveform within normal limits, carotid upstroke brisk and symmetric, no bruits, no thyromegaly LUNGS:  Clear to auscultation bilaterally BACK:  No CVA tenderness CHEST:  Well healed sternotomy scar. HEART:  PMI not displaced or sustained,S1 and S2 within normal limits, no S3, no S4, no clicks, no rubs, no murmurs ABD:  Flat, positive bowel sounds normal in frequency in pitch, no bruits, no rebound, no guarding, no midline pulsatile mass, no hepatomegaly, no splenomegaly, obese EXT:  2 plus pulses throughout, no edema, no cyanosis no clubbing   EKG:  Sinus rhythm, rate 60, left axis deviation RSR V1 and V2, no acute ST-T wave changes.  12/18/2014  ASSESSMENT AND PLAN  CAD - The patient has no new sypmtoms.  No further cardiovascular testing is indicated.  We will continue with aggressive risk reduction and meds as listed. I will consider doing a POET (Plain Old Exercise Treadmill) next year.  HYPERTENSION - . The blood pressure is at target. No change in medications is indicated. We will continue with therapeutic lifestyle changes (TLC).  DYSLIPIDEMIA -  Lab Results  Component Value Date   CHOL 147 12/09/2014   TRIG 164* 12/09/2014   HDL 35* 12/09/2014   LDLCALC 79 12/09/2014   She has not tolerated statins in the past. She has good lipid profile as above and so will remain on the meds as listed.  OBESITY - We had a long discussion about strategies to manage this. Because of her past complications of previous surgery she probably would not be a good candidate for bariatric treatment.

## 2014-12-18 NOTE — Patient Instructions (Signed)
Medication Instructions:  The current medical regimen is effective;  continue present plan and medications.  Follow-Up: Follow up in 1 year with Dr. Hochrein in [place].  You will receive a letter in the mail 2 months before you are due.  Please call us when you receive this letter to schedule your follow up appointment.  Thank you for choosing [facility]!!        

## 2015-01-22 ENCOUNTER — Other Ambulatory Visit: Payer: Self-pay | Admitting: Nurse Practitioner

## 2015-02-18 ENCOUNTER — Telehealth: Payer: Self-pay | Admitting: Family Medicine

## 2015-02-18 NOTE — Telephone Encounter (Signed)
Spoke to pt

## 2015-02-19 ENCOUNTER — Encounter: Payer: Self-pay | Admitting: *Deleted

## 2015-02-27 ENCOUNTER — Telehealth: Payer: Self-pay | Admitting: Family Medicine

## 2015-02-27 NOTE — Telephone Encounter (Signed)
Spoke with patient and advised with her hypertension she only needed the plain mucinex do not use a decongestant.

## 2015-02-27 NOTE — Telephone Encounter (Signed)
Stp and advised she can try mucinex. Pt voiced understanding.

## 2015-03-11 ENCOUNTER — Ambulatory Visit (INDEPENDENT_AMBULATORY_CARE_PROVIDER_SITE_OTHER): Payer: Medicare Other | Admitting: Family Medicine

## 2015-03-11 ENCOUNTER — Encounter: Payer: Self-pay | Admitting: Family Medicine

## 2015-03-11 VITALS — BP 125/60 | HR 46 | Temp 97.2°F | Ht 66.0 in | Wt 341.2 lb

## 2015-03-11 DIAGNOSIS — J0100 Acute maxillary sinusitis, unspecified: Secondary | ICD-10-CM

## 2015-03-11 DIAGNOSIS — E785 Hyperlipidemia, unspecified: Secondary | ICD-10-CM | POA: Diagnosis not present

## 2015-03-11 DIAGNOSIS — I10 Essential (primary) hypertension: Secondary | ICD-10-CM

## 2015-03-11 DIAGNOSIS — M1710 Unilateral primary osteoarthritis, unspecified knee: Secondary | ICD-10-CM

## 2015-03-11 DIAGNOSIS — M129 Arthropathy, unspecified: Secondary | ICD-10-CM | POA: Diagnosis not present

## 2015-03-11 MED ORDER — DICLOFENAC SODIUM 75 MG PO TBEC: 75.0000 mg | DELAYED_RELEASE_TABLET | Freq: Two times a day (BID) | ORAL | Status: DC

## 2015-03-11 MED ORDER — AMOXICILLIN-POT CLAVULANATE 875-125 MG PO TABS: 1.0000 | ORAL_TABLET | Freq: Two times a day (BID) | ORAL | Status: DC

## 2015-03-11 MED ORDER — FENOFIBRATE 160 MG PO TABS: 160.0000 mg | ORAL_TABLET | Freq: Every day | ORAL | Status: DC

## 2015-03-11 NOTE — Progress Notes (Signed)
Subjective:  Patient ID: Julia West, female    DOB: [birth year]  Age: 59 y.o. MRN: [id number]  CC: Follow-up   HPI BOBBIJO HOLST presents for  follow-up of hypertension. Patient has no history of headache chest pain or shortness of breath or recent cough. Patient also denies symptoms of TIA such as numbness weakness lateralizing. Patient checks  blood pressure at home and has not had any elevated readings recently. Patient denies side effects from his medication. States taking it regularly.  Patient also  in for follow-up of elevated cholesterol. Doing well without complaints on current medication. Denies side effects of statin including myalgia and arthralgia and nausea. Also in today for liver function testing. Currently no chest pain, shortness of breath or other cardiovascular related symptoms noted.  Current symptoms include congestion, facial pain, nasal congestion, no  fever, non productive cough, post nasal drip and sinus pressure with no fever, chills, night sweats or weight loss. Onset of symptoms was a few days ago, gradually worsening since that time. Pt.is drinking moderate amounts of fluids.     History Laci has a past medical history of Morbid obesity ([place]); Other and unspecified hyperlipidemia; Unspecified essential hypertension; Acute myocardial infarction; Degenerative joint disease; Internal and external hemorrhoids without complication; Anxiety; Coronary artery disease; Complication of anesthesia; Surgical wound, non healing (ABDOMINAL ); Degenerative joint disease; and Lymphedema of arm.   She has past surgical history that includes Cesarean section; Cholecystectomy; Coronary artery bypass graft (2003); Incisional hernia repair (02/07/2012); Bowel resection (02/07/2012); Application if wound vac; Incision and drainage of wound (04/17/2012); Lesion removal (04/17/2012); Incision and drainage of wound (N/A, 04/24/2012); Incision and drainage of wound (N/A, 05/01/2012);  Incision and drainage of wound (N/A, 05/08/2012); Cardiovascular stress test (01-17-2012  DR [facility]); Incision and drainage of wound (N/A, 05/15/2012); Incision and drainage of wound (N/A, 05/22/2012); Incision and drainage of wound (N/A, 06/28/2012); polypectomy (N/A, 05/14/2014); and Hysteroscopy w/D&C (N/A, 05/14/2014).   Her family history includes Birth defects in her son; Diabetes in her brother; Heart attack in her mother; Hypertension in her sister.She reports that she quit smoking about 3 years ago. Her smoking use included Cigarettes. She has a 25 pack-year smoking history. She has never used smokeless tobacco. She reports that she does not drink alcohol or use illicit drugs.  Current Outpatient Prescriptions on File Prior to Visit  Medication Sig Dispense Refill  . aspirin 81 MG tablet Take 81 mg by mouth daily.     . fish oil-omega-3 fatty acids 1000 MG capsule Take 2 g by mouth daily.      . furosemide (LASIX) 20 MG tablet TAKE 1 TABLET (20 MG TOTAL) BY MOUTH DAILY. 90 tablet 1  . metoprolol (LOPRESSOR) 50 MG tablet Take 1 tablet (50 mg total) by mouth 2 (two) times daily. 180 tablet 1  . Multiple Vitamin (MULTIVITAMIN) tablet Take 1 tablet by mouth daily.    . traZODone (DESYREL) 150 MG tablet 1 or 2 at bedtime for sleep (Patient taking differently: Take 150 mg by mouth. 1 or 2 at bedtime for sleep) 60 tablet 2  . traZODone (DESYREL) 150 MG tablet 150 mg. Take 1 to 2 by mouth daily as needed for sleep    . valsartan-hydrochlorothiazide (DIOVAN-HCT) 160-12.5 MG per tablet Take 1 tablet by mouth daily. 90 tablet 1   No current facility-administered medications on file prior to visit.    ROS Review of Systems  Constitutional: Negative for fever, chills, activity change and appetite change.  HENT: Positive for congestion, postnasal drip, rhinorrhea and sinus pressure. Negative for ear discharge, ear pain, hearing loss, nosebleeds, sneezing, sore throat and trouble swallowing.   Eyes:  Negative for visual disturbance.  Respiratory: Negative for cough, chest tightness and shortness of breath.   Cardiovascular: Negative for chest pain and palpitations.  Gastrointestinal: Negative for nausea, abdominal pain and diarrhea.  Genitourinary: Negative for dysuria.  Musculoskeletal: Negative for myalgias and arthralgias.  Skin: Negative for rash.    Objective:  BP 125/60 mmHg  Pulse 46  Temp(Src) 97.2 F (36.2 C) (Oral)  Ht _0  (1.676 m)  Wt 341 lb 3.2 oz (154.767 kg)  BMI 55.10 kg/m2  SpO2 96%  BP Readings from Last 3 Encounters:  03/11/15 125/60  12/18/14 120/62  12/09/14 139/58    Wt Readings from Last 3 Encounters:  03/11/15 341 lb 3.2 oz (154.767 kg)  12/18/14 338 lb (153.316 kg)  12/09/14 348 lb 12.8 oz (158.215 kg)     Physical Exam  Constitutional: She is oriented to person, place, and time. She appears well-developed and well-nourished. No distress.  HENT:  Head: Normocephalic and atraumatic.  Right Ear: Tympanic membrane and external ear normal. No decreased hearing is noted.  Left Ear: Tympanic membrane and external ear normal. No decreased hearing is noted.  Nose: Mucosal edema and sinus tenderness present. Right sinus exhibits maxillary sinus tenderness. Right sinus exhibits no frontal sinus tenderness. Left sinus exhibits maxillary sinus tenderness. Left sinus exhibits no frontal sinus tenderness.  Mouth/Throat: Oropharynx is clear and moist. No oropharyngeal exudate or posterior oropharyngeal erythema.  Eyes: Conjunctivae and EOM are normal. Pupils are equal, round, and reactive to light.  Neck: Normal range of motion. Neck supple. No Brudzinski's sign noted. No thyromegaly present.  Cardiovascular: Normal rate, regular rhythm and normal heart sounds.   No murmur heard. Pulmonary/Chest: Effort normal and breath sounds normal. No respiratory distress. She has no wheezes. She has no rales.  Abdominal: Soft. Bowel sounds are normal. She exhibits no  distension. There is no tenderness.  Lymphadenopathy:       Head (right side): No preauricular adenopathy present.       Head (left side): No preauricular adenopathy present.    She has no cervical adenopathy.       Right cervical: No superficial cervical adenopathy present.      Left cervical: No superficial cervical adenopathy present.  Neurological: She is alert and oriented to person, place, and time. She has normal reflexes.  Skin: Skin is warm and dry.  Psychiatric: She has a normal mood and affect. Her behavior is normal. Judgment and thought content normal.    No results found for: HGBA1C  Lab Results  Component Value Date   WBC 11.4* 12/09/2014   HGB 14.5 09/05/2014   HCT 43.5 12/09/2014   PLT 250 05/10/2014   GLUCOSE 92 03/11/2015   CHOL 142 03/11/2015   TRIG 110 03/11/2015   HDL 47 03/11/2015   LDLCALC 73 03/11/2015   ALT 12 03/11/2015   AST 15 03/11/2015   NA 143 03/11/2015   K 4.3 03/11/2015   CL 97 03/11/2015   CREATININE 0.93 03/11/2015   BUN 17 03/11/2015   CO2 27 03/11/2015   TSH 0.414* 12/09/2014    No results found.  Assessment & Plan:    Hyperlipidemia - Plan: CMP14+EGFR, Lipid panel  Acute maxillary sinusitis, recurrence not specified  Essential hypertension  Morbid obesity, unspecified obesity type ([place])  Arthritis of knee Medcation additions, deletions  and continuations summarized below:  Current outpatient prescriptions:  .  aspirin 81 MG tablet, Take 81 mg by mouth daily. , Disp: , Rfl:  .  fish oil-omega-3 fatty acids 1000 MG capsule, Take 2 g by mouth daily.  , Disp: , Rfl:  .  furosemide (LASIX) 20 MG tablet, TAKE 1 TABLET (20 MG TOTAL) BY MOUTH DAILY., Disp: 90 tablet, Rfl: 1 .  metoprolol (LOPRESSOR) 50 MG tablet, Take 1 tablet (50 mg total) by mouth 2 (two) times daily., Disp: 180 tablet, Rfl: 1 .  Multiple Vitamin (MULTIVITAMIN) tablet, Take 1 tablet by mouth daily., Disp: , Rfl:  .  traZODone (DESYREL) 150 MG tablet, 1 or 2 at  bedtime for sleep (Patient taking differently: Take 150 mg by mouth. 1 or 2 at bedtime for sleep), Disp: 60 tablet, Rfl: 2 .  traZODone (DESYREL) 150 MG tablet, 150 mg. Take 1 to 2 by mouth daily as needed for sleep, Disp: , Rfl:  .  valsartan-hydrochlorothiazide (DIOVAN-HCT) 160-12.5 MG per tablet, Take 1 tablet by mouth daily., Disp: 90 tablet, Rfl: 1 .  amoxicillin-clavulanate (AUGMENTIN) 875-125 MG tablet, Take 1 tablet by mouth 2 (two) times daily. Take all of this medication, Disp: 20 tablet, Rfl: 0 .  diclofenac (VOLTAREN) 75 MG EC tablet, Take 1 tablet (75 mg total) by mouth 2 (two) times daily., Disp: 180 tablet, Rfl: 1 .  fenofibrate 160 MG tablet, Take 1 tablet (160 mg total) by mouth daily. For cholesterol and triglyceride, Disp: 90 tablet, Rfl: 3     Follow-up: No Follow-up on file.  [name], M.D.

## 2015-03-12 LAB — CMP14+EGFR
A/G RATIO: 1.6 (ref 1.1–2.5)
ALK PHOS: 41 IU/L (ref 39–117)
ALT: 12 IU/L (ref 0–32)
AST: 15 IU/L (ref 0–40)
Albumin: 4.1 g/dL (ref 3.6–4.8)
BILIRUBIN TOTAL: 0.3 mg/dL (ref 0.0–1.2)
BUN/Creatinine Ratio: 18 (ref 11–26)
BUN: 17 mg/dL (ref 8–27)
CO2: 27 mmol/L (ref 18–29)
Calcium: 9.7 mg/dL (ref 8.7–10.3)
Chloride: 97 mmol/L (ref 96–106)
Creatinine, Ser: 0.93 mg/dL (ref 0.57–1.00)
GFR calc Af Amer: 77 mL/min/{1.73_m2} (ref >59)
GFR, EST NON AFRICAN AMERICAN: 67 mL/min/{1.73_m2} (ref >59)
GLOBULIN, TOTAL: 2.5 g/dL (ref 1.5–4.5)
Glucose: 92 mg/dL (ref 65–99)
Potassium: 4.3 mmol/L (ref 3.5–5.2)
Sodium: 143 mmol/L (ref 134–144)
Total Protein: 6.6 g/dL (ref 6.0–8.5)

## 2015-03-12 LAB — LIPID PANEL
CHOLESTEROL TOTAL: 142 mg/dL (ref 100–199)
Chol/HDL Ratio: 3 ratio units (ref 0.0–4.4)
HDL: 47 mg/dL (ref >39)
LDL CALC: 73 mg/dL (ref 0–99)
TRIGLYCERIDES: 110 mg/dL (ref 0–149)
VLDL CHOLESTEROL CAL: 22 mg/dL (ref 5–40)

## 2015-03-21 ENCOUNTER — Telehealth: Payer: Self-pay | Admitting: Family Medicine

## 2015-03-21 ENCOUNTER — Other Ambulatory Visit: Payer: Self-pay | Admitting: *Deleted

## 2015-03-21 MED ORDER — AMOXICILLIN-POT CLAVULANATE 875-125 MG PO TABS: 1.0000 | ORAL_TABLET | Freq: Two times a day (BID) | ORAL | Status: DC

## 2015-03-21 NOTE — Telephone Encounter (Signed)
Refill of augmentin sent. Use mucinex D also (OTC _ ask pharmacist for it.)

## 2015-03-21 NOTE — Telephone Encounter (Signed)
Patient is having some drainage,  post nasal drip, and cough

## 2015-03-21 NOTE — Telephone Encounter (Signed)
Detailed message left for patient with instructions per [facility].

## 2015-03-21 NOTE — Progress Notes (Signed)
RX for Augmentin sent into mail order in error Canceled Rx thru mail order and called into [facility] per pt request

## 2015-03-25 ENCOUNTER — Other Ambulatory Visit: Payer: Self-pay | Admitting: *Deleted

## 2015-03-25 ENCOUNTER — Other Ambulatory Visit: Payer: Self-pay

## 2015-03-25 MED ORDER — TRAZODONE HCL 150 MG PO TABS: ORAL_TABLET | ORAL | Status: DC

## 2015-03-25 MED ORDER — TRAZODONE HCL 150 MG PO TABS: 150.0000 mg | ORAL_TABLET | Freq: Every day | ORAL | Status: DC

## 2015-03-25 NOTE — Telephone Encounter (Signed)
Last seen 03/11/15  Dr Livia Snellen  This is for mail order

## 2015-03-25 NOTE — Progress Notes (Signed)
Pt wanted RX sent into [facility] sent in per pt request

## 2015-03-27 ENCOUNTER — Other Ambulatory Visit: Payer: Self-pay | Admitting: Family Medicine

## 2015-03-27 NOTE — Telephone Encounter (Signed)
Last seen 03/11/15  Dr Livia Snellen

## 2015-05-06 ENCOUNTER — Other Ambulatory Visit: Payer: Self-pay | Admitting: Family Medicine

## 2015-05-20 ENCOUNTER — Telehealth: Payer: Self-pay | Admitting: Family Medicine

## 2015-05-20 NOTE — Telephone Encounter (Signed)
Complains of post nasal drainage and cough. No fever and no other symptoms. Suggested Delsym, Claritin, and Flonase.  She will try these for a few days. If symptoms worse or persist she will call back.

## 2015-08-06 ENCOUNTER — Other Ambulatory Visit: Payer: Self-pay | Admitting: Family Medicine

## 2015-08-20 ENCOUNTER — Ambulatory Visit (INDEPENDENT_AMBULATORY_CARE_PROVIDER_SITE_OTHER): Payer: Medicare Other | Admitting: Family Medicine

## 2015-08-20 ENCOUNTER — Encounter: Payer: Self-pay | Admitting: Family Medicine

## 2015-08-20 VITALS — BP 138/56 | HR 60 | Temp 97.2°F | Ht 66.0 in | Wt 325.2 lb

## 2015-08-20 DIAGNOSIS — J40 Bronchitis, not specified as acute or chronic: Secondary | ICD-10-CM | POA: Diagnosis not present

## 2015-08-20 DIAGNOSIS — I10 Essential (primary) hypertension: Secondary | ICD-10-CM | POA: Diagnosis not present

## 2015-08-20 DIAGNOSIS — E785 Hyperlipidemia, unspecified: Secondary | ICD-10-CM

## 2015-08-20 DIAGNOSIS — J329 Chronic sinusitis, unspecified: Secondary | ICD-10-CM

## 2015-08-20 MED ORDER — AMOXICILLIN-POT CLAVULANATE 875-125 MG PO TABS: 1.0000 | ORAL_TABLET | Freq: Two times a day (BID) | ORAL | Status: DC

## 2015-08-20 MED ORDER — PSEUDOEPHEDRINE-GUAIFENESIN ER 120-1200 MG PO TB12: 1.0000 | ORAL_TABLET | Freq: Two times a day (BID) | ORAL | Status: DC

## 2015-08-20 NOTE — Addendum Note (Signed)
Addended by: Marin Olp on: 08/20/2015 04:05 PM   Modules accepted: Orders

## 2015-08-20 NOTE — Progress Notes (Signed)
Subjective:  Patient ID: Julia West, female    DOB: Dec 26, 1955  Age: 60 y.o. MRN: [id number]  CC: Hypertension and Hyperlipidemia   HPI Julia West presents for  follow-up of hypertension. Patient has no history of headache chest pain or shortness of breath or recent cough. Patient also denies symptoms of TIA such as numbness weakness lateralizing. Patient checks  blood pressure at home and has not had any elevated readings recently. Patient denies side effects from his medication. States taking it regularly.  Patient also  in for follow-up of elevated cholesterol. Doing well without complaints on current medication. Denies side effects of statin including myalgia and arthralgia and nausea. Also in today for liver function testing. Currently no chest pain, shortness of breath or other cardiovascular related symptoms noted.  Also having congestion, facial pain, nasal congestion, productive cough, post nasal drip and sinus pressure with no fever, chills, night sweats. Onset of symptoms was a few days ago, gradually worsening since that time. Pt.is drinking moderate amounts of fluids.     History Rickita has a past medical history of Morbid obesity ([place]); Other and unspecified hyperlipidemia; Unspecified essential hypertension; Acute myocardial infarction; Degenerative joint disease; Internal and external hemorrhoids without complication; Anxiety; Coronary artery disease; Complication of anesthesia; Surgical wound, non healing (ABDOMINAL ); Degenerative joint disease; and Lymphedema of arm.   She has past surgical history that includes Cesarean section; Cholecystectomy; Coronary artery bypass graft (2003); Incisional hernia repair (02/07/2012); Bowel resection (02/07/2012); Application if wound vac; Incision and drainage of wound (04/17/2012); Lesion removal (04/17/2012); Incision and drainage of wound (N/A, 04/24/2012); Incision and drainage of wound (N/A, 05/01/2012); Incision and  drainage of wound (N/A, 05/08/2012); Cardiovascular stress test (01-17-2012  [facility]); Incision and drainage of wound (N/A, 05/15/2012); Incision and drainage of wound (N/A, 05/22/2012); Incision and drainage of wound (N/A, 06/28/2012); polypectomy (N/A, 05/14/2014); and Hysteroscopy w/D&C (N/A, 05/14/2014).   Her family history includes Birth defects in her son; Diabetes in her brother; Heart attack in her mother; Hypertension in her sister.She reports that she quit smoking about 3 years ago. Her smoking use included Cigarettes. She has a 25 pack-year smoking history. She has never used smokeless tobacco. She reports that she does not drink alcohol or use illicit drugs.  Current Outpatient Prescriptions on File Prior to Visit  Medication Sig Dispense Refill  . aspirin 81 MG tablet Take 81 mg by mouth daily.     . diclofenac (VOLTAREN) 75 MG EC tablet Take 1 tablet by mouth two  times daily 180 tablet 0  . fenofibrate 160 MG tablet Take 1 tablet (160 mg total) by mouth daily. For cholesterol and triglyceride 90 tablet 3  . fish oil-omega-3 fatty acids 1000 MG capsule Take 2 g by mouth daily.      . furosemide (LASIX) 20 MG tablet Take 1 tablet by mouth  daily 90 tablet 1  . metoprolol (LOPRESSOR) 50 MG tablet Take 1 tablet by mouth two  times daily 180 tablet 1  . Multiple Vitamin (MULTIVITAMIN) tablet Take 1 tablet by mouth daily.    . traZODone (DESYREL) 150 MG tablet TAKE 1 TO 2 TABLETS AT BEDTIME FOR SLEEP 60 tablet 2  . valsartan-hydrochlorothiazide (DIOVAN-HCT) 160-12.5 MG tablet Take 1 tablet by mouth  daily 90 tablet 0   No current facility-administered medications on file prior to visit.    ROS Review of Systems  Constitutional: Negative for fever, activity change and appetite change.  HENT: Negative for congestion, rhinorrhea  and sore throat.   Eyes: Negative for visual disturbance.  Respiratory: Negative for cough and shortness of breath.   Cardiovascular: Negative for chest pain and  palpitations.  Gastrointestinal: Negative for nausea, abdominal pain and diarrhea.  Genitourinary: Negative for dysuria.  Musculoskeletal: Negative for myalgias and arthralgias.    Objective:  BP 138/56 mmHg  Pulse 60  Temp(Src) 97.2 F (36.2 C) (Oral)  Ht 5' 6" (1.676 m)  Wt 325 lb 3.2 oz (147.51 kg)  BMI 52.51 kg/m2  SpO2 96%  BP Readings from Last 3 Encounters:  08/20/15 138/56  03/11/15 125/60  12/18/14 120/62    Wt Readings from Last 3 Encounters:  08/20/15 325 lb 3.2 oz (147.51 kg)  03/11/15 341 lb 3.2 oz (154.767 kg)  12/18/14 338 lb (153.316 kg)     Physical Exam  Constitutional: She is oriented to person, place, and time. She appears well-developed and well-nourished. No distress.  HENT:  Head: Normocephalic and atraumatic.  Right Ear: External ear normal.  Left Ear: External ear normal.  Nose: Nose normal.  Mouth/Throat: Oropharynx is clear and moist.  Eyes: Conjunctivae and EOM are normal. Pupils are equal, round, and reactive to light.  Neck: Normal range of motion. Neck supple. No thyromegaly present.  Cardiovascular: Normal rate, regular rhythm and normal heart sounds.   No murmur heard. Pulmonary/Chest: Effort normal and breath sounds normal. No respiratory distress. She has no wheezes. She has no rales.  Abdominal: Soft. Bowel sounds are normal. She exhibits no distension. There is no tenderness.  Lymphadenopathy:    She has no cervical adenopathy.  Neurological: She is alert and oriented to person, place, and time. She has normal reflexes.  Skin: Skin is warm and dry.  Psychiatric: She has a normal mood and affect. Her behavior is normal. Judgment and thought content normal.    No results found for: HGBA1C  Lab Results  Component Value Date   WBC 11.4* 12/09/2014   HGB 14.5 09/05/2014   HCT 43.5 12/09/2014   PLT 320 12/09/2014   GLUCOSE 92 03/11/2015   CHOL 142 03/11/2015   TRIG 110 03/11/2015   HDL 47 03/11/2015   LDLCALC 73 03/11/2015     ALT 12 03/11/2015   AST 15 03/11/2015   NA 143 03/11/2015   K 4.3 03/11/2015   CL 97 03/11/2015   CREATININE 0.93 03/11/2015   BUN 17 03/11/2015   CO2 27 03/11/2015   TSH 0.414* 12/09/2014    No results found.  Assessment & Plan:   Erilyn was seen today for hypertension and hyperlipidemia.  Diagnoses and all orders for this visit:  Essential hypertension -     CBC with Differential/Platelet -     CMP14+EGFR -     Lipid panel  Hyperlipemia -     CBC with Differential/Platelet -     CMP14+EGFR -     Lipid panel  Morbid obesity, unspecified obesity type (HCC) -     CBC with Differential/Platelet -     CMP14+EGFR -     Lipid panel  Sinobronchitis  Other orders -     amoxicillin-clavulanate (AUGMENTIN) 875-125 MG tablet; Take 1 tablet by mouth 2 (two) times daily. Take all of this medication -     Pseudoephedrine-Guaifenesin [phone] MG TB12; Take 1 tablet by mouth 2 (two) times daily. For congestion   I have discontinued Ms. Rhatigan's amoxicillin-clavulanate. I am also having her start on amoxicillin-clavulanate and Pseudoephedrine-Guaifenesin. Additionally, I am having her maintain her aspirin, fish  oil-omega-3 fatty acids, multivitamin, fenofibrate, traZODone, furosemide, metoprolol, valsartan-hydrochlorothiazide, and diclofenac.  Meds ordered this encounter  Medications  . amoxicillin-clavulanate (AUGMENTIN) 875-125 MG tablet    Sig: Take 1 tablet by mouth 2 (two) times daily. Take all of this medication    Dispense:  20 tablet    Refill:  0  . Pseudoephedrine-Guaifenesin [phone] MG TB12    Sig: Take 1 tablet by mouth 2 (two) times daily. For congestion    Dispense:  20 each    Refill:  0     Follow-up: Return in about 6 months (around 02/19/2016).  [name], M.D.

## 2015-08-20 NOTE — Addendum Note (Signed)
Addended by: Thana Ates on: 08/20/2015 01:54 PM   Modules accepted: Orders

## 2015-08-21 LAB — CBC WITH DIFFERENTIAL/PLATELET
BASOS: 0 %
Basophils Absolute: 0 10*3/uL (ref 0.0–0.2)
EOS (ABSOLUTE): 0.3 10*3/uL (ref 0.0–0.4)
EOS: 3 %
HEMATOCRIT: 42.8 % (ref 34.0–46.6)
Hemoglobin: 14 g/dL (ref 11.1–15.9)
Immature Grans (Abs): 0 10*3/uL (ref 0.0–0.1)
Immature Granulocytes: 0 %
LYMPHS ABS: 3.3 10*3/uL — AB (ref 0.7–3.1)
Lymphs: 30 %
MCH: 28.6 pg (ref 26.6–33.0)
MCHC: 32.7 g/dL (ref 31.5–35.7)
MCV: 88 fL (ref 79–97)
MONOS ABS: 0.7 10*3/uL (ref 0.1–0.9)
Monocytes: 6 %
Neutrophils Absolute: 6.7 10*3/uL (ref 1.4–7.0)
Neutrophils: 61 %
Platelets: 302 10*3/uL (ref 150–379)
RBC: 4.89 x10E6/uL (ref 3.77–5.28)
RDW: 15.1 % (ref 12.3–15.4)
WBC: 11 10*3/uL — ABNORMAL HIGH (ref 3.4–10.8)

## 2015-08-21 LAB — CMP14+EGFR
A/G RATIO: 1.5 (ref 1.2–2.2)
ALBUMIN: 4.4 g/dL (ref 3.6–4.8)
ALT: 15 IU/L (ref 0–32)
AST: 16 IU/L (ref 0–40)
Alkaline Phosphatase: 39 IU/L (ref 39–117)
BUN / CREAT RATIO: 21 (ref 12–28)
BUN: 28 mg/dL — ABNORMAL HIGH (ref 8–27)
Bilirubin Total: 0.4 mg/dL (ref 0.0–1.2)
CALCIUM: 9.7 mg/dL (ref 8.7–10.3)
CO2: 25 mmol/L (ref 18–29)
Chloride: 97 mmol/L (ref 96–106)
Creatinine, Ser: 1.32 mg/dL — ABNORMAL HIGH (ref 0.57–1.00)
GFR calc Af Amer: 50 mL/min/{1.73_m2} — ABNORMAL LOW (ref >59)
GFR, EST NON AFRICAN AMERICAN: 43 mL/min/{1.73_m2} — AB (ref >59)
GLOBULIN, TOTAL: 2.9 g/dL (ref 1.5–4.5)
Glucose: 86 mg/dL (ref 65–99)
POTASSIUM: 4.5 mmol/L (ref 3.5–5.2)
SODIUM: 142 mmol/L (ref 134–144)
Total Protein: 7.3 g/dL (ref 6.0–8.5)

## 2015-08-21 LAB — LIPID PANEL
CHOLESTEROL TOTAL: 172 mg/dL (ref 100–199)
Chol/HDL Ratio: 4.5 ratio units — ABNORMAL HIGH (ref 0.0–4.4)
HDL: 38 mg/dL — AB (ref >39)
LDL Calculated: 100 mg/dL — ABNORMAL HIGH (ref 0–99)
TRIGLYCERIDES: 169 mg/dL — AB (ref 0–149)
VLDL Cholesterol Cal: 34 mg/dL (ref 5–40)

## 2015-08-27 ENCOUNTER — Telehealth: Payer: Self-pay | Admitting: *Deleted

## 2015-08-27 NOTE — Telephone Encounter (Signed)
Lab results reviewed with patient.

## 2015-09-08 ENCOUNTER — Ambulatory Visit: Payer: Medicare Other | Admitting: Family Medicine

## 2015-09-23 ENCOUNTER — Telehealth: Payer: Self-pay | Admitting: Family Medicine

## 2015-09-23 NOTE — Telephone Encounter (Signed)
Is viral if antibiotic did not help- will have to run its course

## 2015-09-23 NOTE — Telephone Encounter (Signed)
Patient aware.

## 2015-09-23 NOTE — Telephone Encounter (Signed)
Patient was seen 6/17 and given amoxicillin and states she is no better. Patient has tried OTC mucinex and benadryl and npt helping. Patient also states that she has been drinking hot tea with lemon. Patient is still having bilateral eye pressure, cracking in bilateral ears, cough and runny nose. Covering PCP please advise.

## 2015-10-16 ENCOUNTER — Other Ambulatory Visit: Payer: Self-pay | Admitting: Family Medicine

## 2015-12-17 ENCOUNTER — Encounter: Payer: Self-pay | Admitting: Cardiology

## 2015-12-30 NOTE — Progress Notes (Signed)
c   HPI The patient presents for follow of CAD.  She has stress perfusion study in 2013 it was low risk demonstrating a potential small distal inferolateral ischemic defect. However, this was managed medically.  Since I last saw her she has done very well.  She has lost over 35 lbs since last Dec.  She walks for exercise.  The patient denies any new symptoms such as chest discomfort, neck or arm discomfort. There has been no new shortness of breath, PND or orthopnea. There have been no reported palpitations, presyncope or syncope.  Allergies  Allergen Reactions  . Ace Inhibitors Cough    Current Outpatient Prescriptions  Medication Sig Dispense Refill  . aspirin 81 MG tablet Take 81 mg by mouth daily.     . diclofenac (VOLTAREN) 75 MG EC tablet Take 1 tablet by mouth two  times daily 180 tablet 1  . fenofibrate 160 MG tablet Take 1 tablet (160 mg total) by mouth daily. For cholesterol and triglyceride 90 tablet 3  . fish oil-omega-3 fatty acids 1000 MG capsule Take 2 g by mouth daily.      . furosemide (LASIX) 20 MG tablet Take 1 tablet by mouth  daily 90 tablet 1  . metoprolol (LOPRESSOR) 50 MG tablet Take 1 tablet by mouth two  times daily 180 tablet 1  . Multiple Vitamin (MULTIVITAMIN) tablet Take 1 tablet by mouth daily.    . traZODone (DESYREL) 150 MG tablet Take one to two tablets by mouth at bedtime    . valsartan-hydrochlorothiazide (DIOVAN-HCT) 160-12.5 MG tablet Take 1 tablet by mouth  daily 90 tablet 1   No current facility-administered medications for this visit.     Past Medical History:  Diagnosis Date  . Acute myocardial infarction    with a ruptured plaque in the circumflex in 2003  . Anxiety   . Complication of anesthesia    states low O2 sats post-op 11/13  . Degenerative joint disease   . Internal and external hemorrhoids without complication   . Lymphedema of arm    right  . Morbid obesity ([place])   . Other and unspecified hyperlipidemia   . Surgical wound,  non healing ABDOMINAL    has wound vac @ 125 mm Hg  . Unspecified essential hypertension     Past Surgical History:  Procedure Laterality Date  . APPLICATION OF WOUND VAC    . BOWEL RESECTION  02/07/2012   Procedure: SMALL BOWEL RESECTION;  Surgeon: Gayland Curry, MD,FACS;  Location: [place];  Service: General;;  . CARDIOVASCULAR STRESS TEST  01-17-2012  DR [facility]ier[facility] W[facility]   LOW RISK NUCLEAR TEST  . CESAREAN SECTION     x 4 in remote past  . CHOLECYSTECTOMY    . CORONARY ARTERY BYPASS GRAFT  2003   by Dr. Servando Snare. LIMA to the LAD, free RIMA to the circumflex. Stress perfusion study December 2009 with no high-risk areas of ischemia. She has a well-preserved ejection fraction  . HYSTEROSCOPY W/D&C N/A 05/14/2014   Procedure: DILATATION AND CURETTAGE (no specimen); HYSTEROSCOPY;  Surgeon: Jonnie Kind, MD;  Location: [facility];  Service: Gynecology;  Laterality: N/A;  . INCISION AND DRAINAGE OF WOUND  04/17/2012   Procedure: IRRIGATION AND DEBRIDEMENT WOUND;  Surgeon: Theodoro Kos, DO;  Location: [place];  Service: Plastics;  Laterality: N/A;  OF ABDOMINAL WOUND, SURGICAL PREP AND PLACEMENT OF VAC, REMOVAL FOREHEAD SKIN LESION  . INCISION AND DRAINAGE OF WOUND N/A 04/24/2012   Procedure:  IRRIGATION AND DEBRIDEMENT WOUND;  Surgeon: Theodoro Kos, DO;  Location: [place];  Service: Plastics;  Laterality: N/A;  I & D ABDOMINAL WOUND WITH VAC AND ACELL  . INCISION AND DRAINAGE OF WOUND N/A 05/01/2012   Procedure: IRRIGATION AND DEBRIDEMENT WOUND;  Surgeon: Theodoro Kos, DO;  Location: [place];  Service: Plastics;  Laterality: N/A;  WITH SURGICAL PREP AND PLACEMENT OF VAC  . INCISION AND DRAINAGE OF WOUND N/A 05/08/2012   Procedure: IRRIGATION AND DEBRIDEMENT OF ABD WOUND SURGICAL PREP AND PLACEMENT OF VAC ;  Surgeon: Theodoro Kos, DO;  Location: [place];  Service: Plastics;  Laterality: N/A;  IRRIGATION AND DEBRIDEMENT OF ABD WOUND  SURGICAL PREP AND PLACEMENT OF VAC   . INCISION AND DRAINAGE OF WOUND N/A 05/15/2012   Procedure: IRRIGATION AND DEBRIDEMENT OF ABDOMINAL WOUND WITH POSSIBLE SURGICAL PREP AND PLACEMENT OF VAC;  Surgeon: Theodoro Kos, DO;  Location: [place];  Service: Plastics;  Laterality: N/A;  . INCISION AND DRAINAGE OF WOUND N/A 05/22/2012   Procedure: IRRIGATION AND DEBRIDEMENT OF ABODOMINAL WOUND WITH  SURGICAL PREP AND VAC PLACEMENT;  Surgeon: Theodoro Kos, DO;  Location: [place];  Service: Plastics;  Laterality: N/A;  . INCISION AND DRAINAGE OF WOUND N/A 06/28/2012   Procedure: IRRIGATION AND DEBRIDEMENT OF ABDOMINAL ULCER SURGICAL PREP AND PLACEMENT OF ACELL AND VAC;  Surgeon: Theodoro Kos, DO;  Location: [place];  Service: Plastics;  Laterality: N/A;  . INCISIONAL HERNIA REPAIR  02/07/2012   Procedure: HERNIA REPAIR INCISIONAL;  Surgeon: Gayland Curry, MD,FACS;  Location: [place];  Service: General;;  Open, Primary repair, strangulated Incisional hernia.  Marland Kitchen LESION REMOVAL  04/17/2012   Procedure: LESION REMOVAL;  Surgeon: Theodoro Kos, DO;  Location: [place];  Service: Plastics;  Laterality: N/A;   CENTER OF FOREHEAD, REMOVAL FORHEAD SKIN LESION  . POLYPECTOMY N/A 05/14/2014   Procedure: ENDOMETRIAL POLYPECTOMY;  Surgeon: Jonnie Kind, MD;  Location: [facility];  Service: Gynecology;  Laterality: N/A;    ROS:   As stated in the HPI and negative for all other systems.  PHYSICAL EXAM BP 132/73   Pulse (!) 48   Ht 5\' 7"  (1.702 m)   Wt (!) 304 lb (137.9 kg)   BMI 47.61 kg/m  GENERAL:  Well appearing NECK:  No jugular venous distention, waveform within normal limits, carotid upstroke brisk and symmetric, no bruits, no thyromegaly LUNGS:  Clear to auscultation bilaterally BACK:  No CVA tenderness CHEST:  Well healed sternotomy scar. HEART:  PMI not displaced or sustained,S1 and S2 within normal limits, no S3, no S4, no clicks, no  rubs, no murmurs ABD:  Flat, positive bowel sounds normal in frequency in pitch, no bruits, no rebound, no guarding, no midline pulsatile mass, no hepatomegaly, no splenomegaly, obese EXT:  2 plus pulses throughout, no edema, no cyanosis no clubbing   EKG:  Sinus rhythm, rate 48, left axis deviation RSR V1 and V2, no acute ST-T wave changes.  12/31/2015  ASSESSMENT AND PLAN  CAD - The patient has no new sypmtoms.  No further cardiovascular testing is indicated.  We will continue with aggressive risk reduction and meds as listed.   HYPERTENSION - . The blood pressure is at target. No change in medications is indicated. We will continue with therapeutic lifestyle changes (TLC).  DYSLIPIDEMIA -  Lab Results  Component Value Date   CHOL 172 08/20/2015   TRIG 169 (  H) 08/20/2015   HDL 38 (L) 08/20/2015   LDLCALC 100 (H) 08/20/2015   She has not tolerated statins in the past. She has good lipid profile as above and so will remain on the meds as listed.  OBESITY - I am so proud of her!!!   She will continue more of the same.

## 2015-12-31 ENCOUNTER — Ambulatory Visit (INDEPENDENT_AMBULATORY_CARE_PROVIDER_SITE_OTHER): Payer: Medicare Other | Admitting: Cardiology

## 2015-12-31 ENCOUNTER — Encounter: Payer: Self-pay | Admitting: Cardiology

## 2015-12-31 VITALS — BP 132/73 | HR 48 | Ht 67.0 in | Wt 304.0 lb

## 2015-12-31 DIAGNOSIS — I2510 Atherosclerotic heart disease of native coronary artery without angina pectoris: Secondary | ICD-10-CM | POA: Diagnosis not present

## 2015-12-31 DIAGNOSIS — I10 Essential (primary) hypertension: Secondary | ICD-10-CM

## 2015-12-31 MED ORDER — NITROGLYCERIN 0.4 MG SL SUBL: 0.4000 mg | SUBLINGUAL_TABLET | SUBLINGUAL | 99 refills | Status: DC | PRN

## 2015-12-31 NOTE — Addendum Note (Signed)
Addended by: Shellia Cleverly on: 12/31/2015 11:13 AM   Modules accepted: Orders

## 2015-12-31 NOTE — Patient Instructions (Signed)

## 2016-01-01 ENCOUNTER — Other Ambulatory Visit: Payer: Self-pay | Admitting: Family Medicine

## 2016-01-08 ENCOUNTER — Other Ambulatory Visit: Payer: Self-pay | Admitting: *Deleted

## 2016-01-08 ENCOUNTER — Other Ambulatory Visit (HOSPITAL_COMMUNITY): Payer: Self-pay | Admitting: *Deleted

## 2016-01-08 MED ORDER — NITROGLYCERIN 0.4 MG SL SUBL: 0.4000 mg | SUBLINGUAL_TABLET | SUBLINGUAL | 6 refills | Status: DC | PRN

## 2016-01-09 ENCOUNTER — Ambulatory Visit (INDEPENDENT_AMBULATORY_CARE_PROVIDER_SITE_OTHER): Payer: Medicare Other | Admitting: Family Medicine

## 2016-01-09 ENCOUNTER — Other Ambulatory Visit: Payer: Self-pay | Admitting: *Deleted

## 2016-01-09 ENCOUNTER — Encounter: Payer: Self-pay | Admitting: Family Medicine

## 2016-01-09 VITALS — BP 138/78 | HR 50 | Temp 98.1°F | Ht 67.0 in | Wt 305.0 lb

## 2016-01-09 DIAGNOSIS — N3090 Cystitis, unspecified without hematuria: Secondary | ICD-10-CM

## 2016-01-09 DIAGNOSIS — R3915 Urgency of urination: Secondary | ICD-10-CM

## 2016-01-09 DIAGNOSIS — Z23 Encounter for immunization: Secondary | ICD-10-CM | POA: Diagnosis not present

## 2016-01-09 LAB — URINALYSIS, COMPLETE
Bilirubin, UA: NEGATIVE
Glucose, UA: NEGATIVE
NITRITE UA: NEGATIVE
RBC, UA: NEGATIVE
Specific Gravity, UA: 1.025 (ref 1.005–1.030)
Urobilinogen, Ur: 1 mg/dL (ref 0.2–1.0)
pH, UA: 5 (ref 5.0–7.5)

## 2016-01-09 LAB — MICROSCOPIC EXAMINATION

## 2016-01-09 MED ORDER — LEVOFLOXACIN 500 MG PO TABS: 500.0000 mg | ORAL_TABLET | Freq: Every day | ORAL | 0 refills | Status: DC

## 2016-01-09 NOTE — Progress Notes (Signed)
Subjective:  Patient ID: Julia West, female    DOB: 05/31/1955  Age: 60 y.o. MRN: [id number]  CC: Urinary Tract Infection   HPI [name] presents for burning with urination and frequency for several days. Denies fever . No flank pain. No nausea, vomiting.    History Julia West has a past medical history of Acute myocardial infarction; Anxiety; Complication of anesthesia; Degenerative joint disease; Internal and external hemorrhoids without complication; Lymphedema of arm; Morbid obesity ([place]); Other and unspecified hyperlipidemia; Surgical wound, non healing (ABDOMINAL ); and Unspecified essential hypertension.   She has a past surgical history that includes Cesarean section; Cholecystectomy; Coronary artery bypass graft (2003); Incisional hernia repair (02/07/2012); Bowel resection (02/07/2012); Application if wound vac; Incision and drainage of wound (04/17/2012); Lesion removal (04/17/2012); Incision and drainage of wound (N/A, 04/24/2012); Incision and drainage of wound (N/A, 05/01/2012); Incision and drainage of wound (N/A, 05/08/2012); Cardiovascular stress test (01-17-2012  [facility]); Incision and drainage of wound (N/A, 05/15/2012); Incision and drainage of wound (N/A, 05/22/2012); Incision and drainage of wound (N/A, 06/28/2012); polypectomy (N/A, 05/14/2014); and Hysteroscopy w/D&C (N/A, 05/14/2014).   Her family history includes Birth defects in her son; Diabetes in her brother; Heart attack in her mother; Hypertension in her sister.She reports that she quit smoking about 4 years ago. Her smoking use included Cigarettes. She has a 25.00 pack-year smoking history. She has never used smokeless tobacco. She reports that she does not drink alcohol or use drugs.    ROS Review of Systems  Constitutional: Negative for chills, diaphoresis and fever.  HENT: Negative for congestion.   Eyes: Negative for visual disturbance.  Respiratory: Negative for cough and shortness of breath.     Cardiovascular: Negative for chest pain and palpitations.  Gastrointestinal: Negative for constipation, diarrhea and nausea.  Genitourinary: Positive for dysuria, frequency and urgency. Negative for decreased urine volume, flank pain, hematuria, menstrual problem and pelvic pain.  Musculoskeletal: Negative for arthralgias and joint swelling.  Skin: Negative for rash.  Neurological: Negative for dizziness and numbness.    Objective:  BP 138/78   Pulse (!) 50   Temp 98.1 F (36.7 C) (Oral)   Ht 5\' 7"  (1.702 m)   Wt (!) 305 lb (138.3 kg)   BMI 47.77 kg/m   BP Readings from Last 3 Encounters:  01/09/16 138/78  12/31/15 132/73  08/20/15 (!) 138/56    Wt Readings from Last 3 Encounters:  01/09/16 (!) 305 lb (138.3 kg)  12/31/15 (!) 304 lb (137.9 kg)  08/20/15 (!) 325 lb 3.2 oz (147.5 kg)     Physical Exam  Constitutional: She is oriented to person, place, and time. She appears well-developed and well-nourished.  HENT:  Head: Normocephalic and atraumatic.  Cardiovascular: Normal rate and regular rhythm.   No murmur heard. Pulmonary/Chest: Effort normal and breath sounds normal.  Abdominal: Soft. Bowel sounds are normal. She exhibits no mass. There is no tenderness. There is no rebound and no guarding.  Musculoskeletal: She exhibits no tenderness.  Neurological: She is alert and oriented to person, place, and time.  Skin: Skin is warm and dry.  Psychiatric: She has a normal mood and affect. Her behavior is normal.     Lab Results  Component Value Date   WBC 11.0 (H) 08/20/2015   HGB 14.5 09/05/2014   HCT 42.8 08/20/2015   PLT 302 08/20/2015   GLUCOSE 86 08/20/2015   CHOL 172 08/20/2015   TRIG 169 (H) 08/20/2015   HDL 38 (L) 08/20/2015  LDLCALC 100 (H) 08/20/2015   ALT 15 08/20/2015   AST 16 08/20/2015   NA 142 08/20/2015   K 4.5 08/20/2015   CL 97 08/20/2015   CREATININE 1.32 (H) 08/20/2015   BUN 28 (H) 08/20/2015   CO2 25 08/20/2015   TSH 0.414 (L)  12/09/2014    No results found.  Assessment & Plan:   Jeris was seen today for urinary tract infection.  Diagnoses and all orders for this visit:  Cystitis  Urinary urgency -     Urinalysis, Complete -     Urine culture  Encounter for immunization -     Flu Vaccine QUAD 36+ mos IM  Other orders -     levofloxacin (LEVAQUIN) 500 MG tablet; Take 1 tablet (500 mg total) by mouth daily. -     Microscopic Examination    I am having Ms. Domagala start on levofloxacin. I am also having her maintain her aspirin, fish oil-omega-3 fatty acids, multivitamin, metoprolol, valsartan-hydrochlorothiazide, diclofenac, furosemide, fenofibrate, traZODone, and nitroGLYCERIN.  Meds ordered this encounter  Medications  . levofloxacin (LEVAQUIN) 500 MG tablet    Sig: Take 1 tablet (500 mg total) by mouth daily.    Dispense:  7 tablet    Refill:  0     Follow-up: Return if symptoms worsen or fail to improve.  [name], M.D.

## 2016-01-10 LAB — URINE CULTURE: ORGANISM ID, BACTERIA: NO GROWTH

## 2016-02-02 ENCOUNTER — Encounter: Payer: Medicare Other | Admitting: *Deleted

## 2016-02-02 DIAGNOSIS — Z1231 Encounter for screening mammogram for malignant neoplasm of breast: Secondary | ICD-10-CM | POA: Diagnosis not present

## 2016-04-13 ENCOUNTER — Other Ambulatory Visit: Payer: Self-pay | Admitting: Family Medicine

## 2016-07-01 ENCOUNTER — Other Ambulatory Visit: Payer: Self-pay | Admitting: Orthopedic Surgery

## 2016-07-01 ENCOUNTER — Other Ambulatory Visit (INDEPENDENT_AMBULATORY_CARE_PROVIDER_SITE_OTHER): Payer: Medicare Other

## 2016-07-01 ENCOUNTER — Ambulatory Visit (INDEPENDENT_AMBULATORY_CARE_PROVIDER_SITE_OTHER): Payer: Medicare Other

## 2016-07-01 DIAGNOSIS — R52 Pain, unspecified: Secondary | ICD-10-CM

## 2016-07-01 DIAGNOSIS — M79671 Pain in right foot: Secondary | ICD-10-CM | POA: Diagnosis not present

## 2016-07-01 DIAGNOSIS — M2140 Flat foot [pes planus] (acquired), unspecified foot: Secondary | ICD-10-CM | POA: Diagnosis not present

## 2016-07-01 DIAGNOSIS — M25572 Pain in left ankle and joints of left foot: Secondary | ICD-10-CM | POA: Diagnosis not present

## 2016-07-01 DIAGNOSIS — Z029 Encounter for administrative examinations, unspecified: Secondary | ICD-10-CM

## 2016-07-02 ENCOUNTER — Other Ambulatory Visit: Payer: Self-pay | Admitting: Family Medicine

## 2016-07-14 DIAGNOSIS — M19072 Primary osteoarthritis, left ankle and foot: Secondary | ICD-10-CM | POA: Diagnosis not present

## 2016-07-14 DIAGNOSIS — M25572 Pain in left ankle and joints of left foot: Secondary | ICD-10-CM | POA: Diagnosis not present

## 2016-07-14 DIAGNOSIS — M6702 Short Achilles tendon (acquired), left ankle: Secondary | ICD-10-CM | POA: Diagnosis not present

## 2016-07-14 DIAGNOSIS — M2140 Flat foot [pes planus] (acquired), unspecified foot: Secondary | ICD-10-CM | POA: Diagnosis not present

## 2016-07-15 ENCOUNTER — Telehealth: Payer: Self-pay | Admitting: Family Medicine

## 2016-07-15 ENCOUNTER — Encounter: Payer: Self-pay | Admitting: Family Medicine

## 2016-07-15 ENCOUNTER — Ambulatory Visit (INDEPENDENT_AMBULATORY_CARE_PROVIDER_SITE_OTHER): Payer: Medicare Other | Admitting: Family Medicine

## 2016-07-15 VITALS — BP 117/51 | HR 45 | Temp 97.8°F | Ht 67.0 in | Wt 289.0 lb

## 2016-07-15 DIAGNOSIS — H6501 Acute serous otitis media, right ear: Secondary | ICD-10-CM | POA: Diagnosis not present

## 2016-07-15 DIAGNOSIS — M1710 Unilateral primary osteoarthritis, unspecified knee: Secondary | ICD-10-CM

## 2016-07-15 MED ORDER — PSEUDOEPHEDRINE-GUAIFENESIN ER 120-1200 MG PO TB12: 1.0000 | ORAL_TABLET | Freq: Two times a day (BID) | ORAL | 0 refills | Status: DC

## 2016-07-15 MED ORDER — CELECOXIB 400 MG PO CAPS: 400.0000 mg | ORAL_CAPSULE | Freq: Every day | ORAL | 5 refills | Status: DC

## 2016-07-15 MED ORDER — AMOXICILLIN-POT CLAVULANATE 875-125 MG PO TABS: 1.0000 | ORAL_TABLET | Freq: Two times a day (BID) | ORAL | 0 refills | Status: DC

## 2016-07-15 NOTE — Progress Notes (Signed)
Subjective:  Patient ID: Julia West, female    DOB: October 27, 1955  Age: 61 y.o. MRN: [id number]  CC: Knee Pain (pt here today wanting a cortisone shot in her knee and was seen at [place] yesterday for foot and ankle. She also thinks she is having sinus problems.)   HPI Julia West presents for Symptoms include congestion, facial pain, nasal congestion, non productive cough, post nasal drip and sinus pressure. There is no fever, chills, or sweats. Onset of symptoms was a few days ago, gradually worsening since that time.   Knee pain as above. Moderately severe. Has to limp. Getting worse. Wants to delay knee replacement. Responds well to injection, but oral meds are not effective.   History Julia West has a past medical history of Acute myocardial infarction; Anxiety; Complication of anesthesia; Degenerative joint disease; Internal and external hemorrhoids without complication; Lymphedema of arm; Morbid obesity ([place]); Other and unspecified hyperlipidemia; Surgical wound, non healing (ABDOMINAL ); and Unspecified essential hypertension.   She has a past surgical history that includes Cesarean section; Cholecystectomy; Coronary artery bypass graft (2003); Incisional hernia repair (02/07/2012); Bowel resection (02/07/2012); Application if wound vac; Incision and drainage of wound (04/17/2012); Lesion removal (04/17/2012); Incision and drainage of wound (N/A, 04/24/2012); Incision and drainage of wound (N/A, 05/01/2012); Incision and drainage of wound (N/A, 05/08/2012); Cardiovascular stress test (01-17-2012  [facility]); Incision and drainage of wound (N/A, 05/15/2012); Incision and drainage of wound (N/A, 05/22/2012); Incision and drainage of wound (N/A, 06/28/2012); polypectomy (N/A, 05/14/2014); and Hysteroscopy w/D&C (N/A, 05/14/2014).   Her family history includes Birth defects in her son; Diabetes in her brother; Heart attack in her mother; Hypertension in her sister.She reports that she quit  smoking about 4 years ago. Her smoking use included Cigarettes. She has a 25.00 pack-year smoking history. She has never used smokeless tobacco. She reports that she does not drink alcohol or use drugs.    ROS Review of Systems  Constitutional: Negative for activity change, appetite change, chills and fever.  HENT: Positive for congestion, postnasal drip, rhinorrhea and sinus pressure. Negative for ear discharge, ear pain, hearing loss, nosebleeds, sneezing, sore throat and trouble swallowing.   Respiratory: Negative for cough, chest tightness and shortness of breath.   Cardiovascular: Negative for chest pain and palpitations.  Gastrointestinal: Negative for nausea and vomiting.  Musculoskeletal: Positive for arthralgias, gait problem, joint swelling and myalgias.  Skin: Negative for rash.    Objective:  BP (!) 117/51   Pulse (!) 45   Temp 97.8 F (36.6 C) (Oral)   Ht 5\' 7"  (1.702 m)   Wt 289 lb (131.1 kg)   BMI 45.26 kg/m   BP Readings from Last 3 Encounters:  07/15/16 (!) 117/51  01/09/16 138/78  12/31/15 132/73    Wt Readings from Last 3 Encounters:  07/15/16 289 lb (131.1 kg)  01/09/16 (!) 305 lb (138.3 kg)  12/31/15 (!) 304 lb (137.9 kg)     Physical Exam  Constitutional: She appears well-developed and well-nourished.  HENT:  Head: Normocephalic and atraumatic.  Right Ear: External ear normal. A middle ear effusion is present. No decreased hearing is noted.  Left Ear: Tympanic membrane and external ear normal. No decreased hearing is noted.  Nose: Mucosal edema present. Right sinus exhibits no frontal sinus tenderness. Left sinus exhibits no frontal sinus tenderness.  Mouth/Throat: No oropharyngeal exudate or posterior oropharyngeal erythema.  Neck: No Brudzinski's sign noted.  Pulmonary/Chest: Breath sounds normal. No respiratory distress.  Musculoskeletal:  Right knee: She exhibits decreased range of motion, swelling, effusion, abnormal alignment and bony  tenderness. She exhibits no erythema. Tenderness found. Medial joint line and lateral joint line tenderness noted.  Lymphadenopathy:       Head (right side): No preauricular adenopathy present.       Head (left side): No preauricular adenopathy present.       Right cervical: No superficial cervical adenopathy present.      Left cervical: No superficial cervical adenopathy present.      Assessment & Plan:   Julia West was seen today for knee pain.  Diagnoses and all orders for this visit:  Arthritis of knee  Right acute serous otitis media, recurrence not specified  Other orders -     amoxicillin-clavulanate (AUGMENTIN) 875-125 MG tablet; Take 1 tablet by mouth 2 (two) times daily. Take all of this medication -     Pseudoephedrine-Guaifenesin [phone] MG TB12; Take 1 tablet by mouth 2 (two) times daily. For congestion -     Discontinue: celecoxib (CELEBREX) 400 MG capsule; Take 1 capsule (400 mg total) by mouth daily. With food for arthritis    A steroid injection was performed at medial joint line, righ tknee  using 1% plain Lidocaine and 9 mg of Celestone. This was well tolerated.    I have discontinued Julia West's levofloxacin and diclofenac. I am also having her start on amoxicillin-clavulanate and Pseudoephedrine-Guaifenesin. Additionally, I am having her maintain her aspirin, fish oil-omega-3 fatty acids, multivitamin, nitroGLYCERIN, furosemide, valsartan-hydrochlorothiazide, fenofibrate, traZODone, and metoprolol.  Allergies as of 07/15/2016      Reactions   Ace Inhibitors Cough      Medication List       Accurate as of 07/15/16 11:59 PM. Always use your most recent med list.          amoxicillin-clavulanate 875-125 MG tablet Commonly known as:  AUGMENTIN Take 1 tablet by mouth 2 (two) times daily. Take all of this medication   aspirin 81 MG tablet Take 81 mg by mouth daily.   celecoxib 400 MG capsule Commonly known as:  CELEBREX Take 1 capsule (400 mg total)  by mouth daily. With food for arthritis   fenofibrate 160 MG tablet TAKE 1 TABLET BY MOUTH  DAILY FOR CHOLESTEROL AND  TRIGLYCERIDES   fish oil-omega-3 fatty acids 1000 MG capsule Take 2 g by mouth daily.   furosemide 20 MG tablet Commonly known as:  LASIX TAKE 1 TABLET BY MOUTH  DAILY   metoprolol 50 MG tablet Commonly known as:  LOPRESSOR TAKE 1 TABLET BY MOUTH TWO  TIMES DAILY   multivitamin tablet Take 1 tablet by mouth daily.   nitroGLYCERIN 0.4 MG SL tablet Commonly known as:  NITROSTAT Place 1 tablet (0.4 mg total) under the tongue every 5 (five) minutes x 3 doses as needed for chest pain. If pain persist after call 911   Pseudoephedrine-Guaifenesin [phone] MG Tb12 Take 1 tablet by mouth 2 (two) times daily. For congestion   traZODone 150 MG tablet Commonly known as:  DESYREL TAKE 1 OR 2 TABLETS BY  MOUTH AT BEDTIME AS NEEDED  FOR SLEEP   valsartan-hydrochlorothiazide 160-12.5 MG tablet Commonly known as:  DIOVAN-HCT TAKE 1 TABLET BY MOUTH  DAILY        Follow-up: Return in about 3 months (around 10/15/2016) for Pain.  [name], M.D.

## 2016-07-18 ENCOUNTER — Other Ambulatory Visit: Payer: Self-pay | Admitting: Family Medicine

## 2016-07-18 MED ORDER — MELOXICAM 15 MG PO TABS
15.0000 mg | ORAL_TABLET | Freq: Every day | ORAL | 5 refills | Status: DC
Start: 2016-07-18 — End: 2016-10-22

## 2016-07-18 NOTE — Telephone Encounter (Signed)
I sent in the requested prescription 

## 2016-07-19 NOTE — Telephone Encounter (Signed)
Aware, per message on voice mail , meloxicam was sent to pharmacy.

## 2016-07-29 ENCOUNTER — Telehealth: Payer: Self-pay | Admitting: Family Medicine

## 2016-07-29 NOTE — Telephone Encounter (Signed)
Pt has continued ear pressure Denies pain Will try some Flonase nasal spray and OTC decongestant Will RTC if sxs persist or worsen

## 2016-08-03 ENCOUNTER — Other Ambulatory Visit: Payer: Self-pay | Admitting: Family Medicine

## 2016-09-09 ENCOUNTER — Other Ambulatory Visit: Payer: Self-pay | Admitting: Family Medicine

## 2016-09-09 NOTE — Telephone Encounter (Signed)
Last seen 07/15/16  Dr Livia Snellen

## 2016-09-14 DIAGNOSIS — M2142 Flat foot [pes planus] (acquired), left foot: Secondary | ICD-10-CM | POA: Diagnosis not present

## 2016-10-22 ENCOUNTER — Other Ambulatory Visit: Payer: Self-pay | Admitting: *Deleted

## 2016-10-25 MED ORDER — MELOXICAM 15 MG PO TABS: 15.0000 mg | ORAL_TABLET | Freq: Every day | ORAL | 0 refills | Status: DC

## 2016-10-28 ENCOUNTER — Other Ambulatory Visit: Payer: Self-pay | Admitting: Family Medicine

## 2016-12-10 ENCOUNTER — Encounter: Payer: Self-pay | Admitting: Family Medicine

## 2016-12-10 ENCOUNTER — Ambulatory Visit (INDEPENDENT_AMBULATORY_CARE_PROVIDER_SITE_OTHER): Payer: Medicare Other | Admitting: Family Medicine

## 2016-12-10 VITALS — BP 130/58 | HR 47 | Temp 98.2°F | Ht 67.0 in | Wt 287.0 lb

## 2016-12-10 DIAGNOSIS — H6504 Acute serous otitis media, recurrent, right ear: Secondary | ICD-10-CM | POA: Diagnosis not present

## 2016-12-10 DIAGNOSIS — Z780 Asymptomatic menopausal state: Secondary | ICD-10-CM

## 2016-12-10 DIAGNOSIS — Z0000 Encounter for general adult medical examination without abnormal findings: Secondary | ICD-10-CM | POA: Diagnosis not present

## 2016-12-10 DIAGNOSIS — M1711 Unilateral primary osteoarthritis, right knee: Secondary | ICD-10-CM

## 2016-12-10 DIAGNOSIS — I10 Essential (primary) hypertension: Secondary | ICD-10-CM | POA: Diagnosis not present

## 2016-12-10 DIAGNOSIS — Z23 Encounter for immunization: Secondary | ICD-10-CM | POA: Diagnosis not present

## 2016-12-10 DIAGNOSIS — E785 Hyperlipidemia, unspecified: Secondary | ICD-10-CM | POA: Diagnosis not present

## 2016-12-10 DIAGNOSIS — M1710 Unilateral primary osteoarthritis, unspecified knee: Secondary | ICD-10-CM

## 2016-12-10 LAB — URINALYSIS
BILIRUBIN UA: NEGATIVE
GLUCOSE, UA: NEGATIVE
Ketones, UA: NEGATIVE
Nitrite, UA: NEGATIVE
PH UA: 5 (ref 5.0–7.5)
PROTEIN UA: NEGATIVE
RBC, UA: NEGATIVE
Specific Gravity, UA: 1.015 (ref 1.005–1.030)
Urobilinogen, Ur: 0.2 mg/dL (ref 0.2–1.0)

## 2016-12-10 MED ORDER — FENOFIBRATE 160 MG PO TABS: ORAL_TABLET | ORAL | 1 refills | Status: DC

## 2016-12-10 MED ORDER — METOPROLOL TARTRATE 50 MG PO TABS
25.0000 mg | ORAL_TABLET | Freq: Two times a day (BID) | ORAL | 1 refills | Status: DC
Start: 2016-12-10 — End: 2017-02-08

## 2016-12-10 MED ORDER — DICLOFENAC SODIUM 75 MG PO TBEC: 75.0000 mg | DELAYED_RELEASE_TABLET | Freq: Two times a day (BID) | ORAL | 1 refills | Status: DC

## 2016-12-10 MED ORDER — VALSARTAN-HYDROCHLOROTHIAZIDE 160-12.5 MG PO TABS: 1.0000 | ORAL_TABLET | Freq: Every day | ORAL | 1 refills | Status: DC

## 2016-12-10 MED ORDER — AMOXICILLIN-POT CLAVULANATE 875-125 MG PO TABS: 1.0000 | ORAL_TABLET | Freq: Two times a day (BID) | ORAL | 0 refills | Status: DC

## 2016-12-10 NOTE — Progress Notes (Addendum)
Subjective:  Patient ID: Julia West, female    DOB: May 16, 1955  Age: 61 y.o. MRN: [id number]  CC: Annual Exam (pt here today for CPE)   HPI Julia West presents for CPE and follow up of medical dx. Now wearing left foot brace. Chronic   Deformity noted. Surgery recommended for reconstruction. Pt. Declined, accepted brace as a substitute. Pt. Able to ambulate with a cane. Right knee pain has returned and requests steroid injection. That still gives significant relief for several months.   follow-up of hypertension. Patient has no history of headache chest pain or shortness of breath or recent cough. Patient also denies symptoms of TIA such as numbness weakness lateralizing. Patient checks  blood pressure at home and has not had any elevated readings recently. Patient denies side effects from his medication. States taking it regularly. Patient in for follow-up of elevated cholesterol. Doing well without complaints on current medication. Denies side effects of statin including myalgia and arthralgia and nausea. Also in today for liver function testing. Currently no chest pain, shortness of breath or other cardiovascular related symptoms noted.  Depression screen [facility] 2/9 12/10/2016 07/15/2016 01/09/2016  Decreased Interest 0 0 0  Down, Depressed, Hopeless 0 0 0  PHQ - 2 Score 0 0 0  Altered sleeping - - -  Tired, decreased energy - - -  Change in appetite - - -  Feeling bad or failure about yourself  - - -  Trouble concentrating - - -  Moving slowly or fidgety/restless - - -  Suicidal thoughts - - -  PHQ-9 Score - - -    History Julia West has a past medical history of Acute myocardial infarction; Anxiety; Complication of anesthesia; Degenerative joint disease; Internal and external hemorrhoids without complication; Lymphedema of arm; Morbid obesity ([place]); Other and unspecified hyperlipidemia; Surgical wound, non healing (ABDOMINAL ); and Unspecified essential hypertension.   She  has a past surgical history that includes Cesarean section; Cholecystectomy; Coronary artery bypass graft (2003); Incisional hernia repair (02/07/2012); Bowel resection (02/07/2012); Application if wound vac; Incision and drainage of wound (04/17/2012); Lesion removal (04/17/2012); Incision and drainage of wound (N/A, 04/24/2012); Incision and drainage of wound (N/A, 05/01/2012); Incision and drainage of wound (N/A, 05/08/2012); Cardiovascular stress test (01-17-2012  [facility]); Incision and drainage of wound (N/A, 05/15/2012); Incision and drainage of wound (N/A, 05/22/2012); Incision and drainage of wound (N/A, 06/28/2012); polypectomy (N/A, 05/14/2014); and Hysteroscopy w/D&C (N/A, 05/14/2014).   Her family history includes Birth defects in her son; Diabetes in her brother; Heart attack in her mother; Hypertension in her sister.She reports that she quit smoking about 5 years ago. Her smoking use included Cigarettes. She has a 25.00 pack-year smoking history. She has never used smokeless tobacco. She reports that she does not drink alcohol or use drugs.    ROS Review of Systems  Constitutional: Negative for appetite change, chills, diaphoresis, fatigue, fever and unexpected weight change.  HENT: Negative for congestion, ear pain, hearing loss, postnasal drip, rhinorrhea, sneezing, sore throat and trouble swallowing.   Eyes: Negative for pain.  Respiratory: Negative for cough, chest tightness and shortness of breath.   Cardiovascular: Negative for chest pain and palpitations.  Gastrointestinal: Negative for abdominal pain, constipation, diarrhea, nausea and vomiting.  Endocrine: Negative for cold intolerance, heat intolerance, polydipsia, polyphagia and polyuria.  Genitourinary: Negative for dysuria, frequency and menstrual problem.  Musculoskeletal: Positive for arthralgias (right knee, left foot) and joint swelling (right knee).  Skin: Negative for rash.  Allergic/Immunologic:  Negative for environmental  allergies.  Neurological: Negative for dizziness, weakness, numbness and headaches.  Psychiatric/Behavioral: Negative for agitation and dysphoric mood.    Objective:  BP (!) 130/58   Pulse (!) 47   Temp 98.2 F (36.8 C) (Oral)   Ht _0  (1.702 m)   Wt 287 lb (130.2 kg)   BMI 44.95 kg/m   BP Readings from Last 3 Encounters:  12/10/16 (!) 130/58  07/15/16 (!) 117/51  01/09/16 138/78    Wt Readings from Last 3 Encounters:  12/10/16 287 lb (130.2 kg)  07/15/16 289 lb (131.1 kg)  01/09/16 (!) 305 lb (138.3 kg)     Physical Exam  Constitutional: She is oriented to person, place, and time. She appears well-developed and well-nourished. No distress.  HENT:  Head: Normocephalic and atraumatic.  Right Ear: External ear normal. A middle ear effusion is present.  Left Ear: External ear normal.  Nose: Nose normal.  Mouth/Throat: Oropharynx is clear and moist.  Eyes: Pupils are equal, round, and reactive to light. Conjunctivae and EOM are normal.  Neck: Normal range of motion. Neck supple. No thyromegaly present.  Cardiovascular: Normal rate, regular rhythm and normal heart sounds.   No murmur heard. Pulmonary/Chest: Effort normal and breath sounds normal. No respiratory distress. She has no wheezes. She has no rales.  Breast exam deferred by pt. Plans to see GYN for Pap & breast exam this fall.    Abdominal: Soft. Normal appearance and bowel sounds are normal. She exhibits no distension, no abdominal bruit and no mass. There is no splenomegaly or hepatomegaly. There is no tenderness. There is no tenderness at McBurney's point and negative Murphy's sign.  Musculoskeletal: Normal range of motion. She exhibits no edema or tenderness.  Lymphadenopathy:    She has no cervical adenopathy.  Neurological: She is alert and oriented to person, place, and time. She has normal reflexes.  Skin: Skin is warm and dry. No rash noted.  Psychiatric: She has a normal mood and affect. Her behavior  is normal. Judgment and thought content normal.      Assessment & Plan:   Julia West was seen today for annual exam.  Diagnoses and all orders for this visit:  Hyperlipidemia, unspecified hyperlipidemia type -     fenofibrate 160 MG tablet; TAKE 1 TABLET BY MOUTH  DAILY FOR CHOLESTEROL AND  TRIGLYCERIDES -     Lipid panel -     Urinalysis  Need for immunization against influenza -     Flu Vaccine QUAD 36+ mos IM  Essential hypertension -     metoprolol tartrate (LOPRESSOR) 50 MG tablet; Take 0.5 tablets (25 mg total) by mouth 2 (two) times daily. -     CBC with Differential/Platelet -     CMP14+EGFR  Postmenopausal -     VITAMIN D 25 Hydroxy (Vit-D Deficiency, Fractures)  Recurrent acute serous otitis media of right ear -     amoxicillin-clavulanate (AUGMENTIN) 875-125 MG tablet; Take 1 tablet by mouth 2 (two) times daily.  Arthritis of knee -     diclofenac (VOLTAREN) 75 MG EC tablet; Take 1 tablet (75 mg total) by mouth 2 (two) times daily.  Well adult exam  Other orders -     valsartan-hydrochlorothiazide (DIOVAN-HCT) 160-12.5 MG tablet; Take 1 tablet by mouth daily.    A steroid injection was performed at medial joint line right knee using betadine prep and 9 mg of Celestone. This was well tolerated.    I have discontinued Julia West's amoxicillin-clavulanate and Pseudoephedrine-Guaifenesin. I have also changed her diclofenac, valsartan-hydrochlorothiazide, and metoprolol tartrate. Additionally, I am having her start on amoxicillin-clavulanate. Lastly, I am having her maintain her aspirin, fish oil-omega-3 fatty acids, multivitamin, nitroGLYCERIN, furosemide, meloxicam, traZODone, and fenofibrate.  Allergies as of 12/10/2016      Reactions   Ace Inhibitors Cough      Medication List       Accurate as of 12/10/16 10:49 PM. Always use your most recent med list.          amoxicillin-clavulanate 875-125 MG tablet Commonly known as:  AUGMENTIN Take 1 tablet  by mouth 2 (two) times daily.   aspirin 81 MG tablet Take 81 mg by mouth daily.   diclofenac 75 MG EC tablet Commonly known as:  VOLTAREN Take 1 tablet (75 mg total) by mouth 2 (two) times daily.   fenofibrate 160 MG tablet TAKE 1 TABLET BY MOUTH  DAILY FOR CHOLESTEROL AND  TRIGLYCERIDES   fish oil-omega-3 fatty acids 1000 MG capsule Take 2 g by mouth daily.   furosemide 20 MG tablet Commonly known as:  LASIX TAKE 1 TABLET BY MOUTH  DAILY   meloxicam 15 MG tablet Commonly known as:  MOBIC Take 1 tablet (15 mg total) by mouth daily. For joint and muscle pain   metoprolol tartrate 50 MG tablet Commonly known as:  LOPRESSOR Take 0.5 tablets (25 mg total) by mouth 2 (two) times daily.   multivitamin tablet Take 1 tablet by mouth daily.   nitroGLYCERIN 0.4 MG SL tablet Commonly known as:  NITROSTAT Place 1 tablet (0.4 mg total) under the tongue every 5 (five) minutes x 3 doses as needed for chest pain. If pain persist after call 911   traZODone 150 MG tablet Commonly known as:  DESYREL TAKE 1 OR 2 TABLETS BY  MOUTH AT BEDTIME AS NEEDED  FOR SLEEP   valsartan-hydrochlorothiazide 160-12.5 MG tablet Commonly known as:  DIOVAN-HCT Take 1 tablet by mouth daily.            Discharge Care Instructions        Start     Ordered   12/10/16 0000  diclofenac (VOLTAREN) 75 MG EC tablet  2 times daily     12/10/16 1155   12/10/16 0000  fenofibrate 160 MG tablet     12/10/16 1155   12/10/16 0000  valsartan-hydrochlorothiazide (DIOVAN-HCT) 160-12.5 MG tablet  Daily     12/10/16 1155   12/10/16 0000  metoprolol tartrate (LOPRESSOR) 50 MG tablet  2 times daily     12/10/16 1155   12/10/16 0000  amoxicillin-clavulanate (AUGMENTIN) 875-125 MG tablet  2 times daily     12/10/16 1156   12/10/16 0000  Flu Vaccine QUAD 36+ mos IM     12/10/16 1157   12/10/16 0000  CBC with Differential/Platelet     12/10/16 1159   12/10/16 0000  CMP14+EGFR     12/10/16 1159   12/10/16 0000   Lipid panel     12/10/16 1159   12/10/16 0000  VITAMIN D 25 Hydroxy (Vit-D Deficiency, Fractures)     12/10/16 1159   12/10/16 0000  Urinalysis     12/10/16 1159       Follow-up: Return in about 6 months (around 06/09/2017).  [name], M.D.

## 2016-12-11 LAB — VITAMIN D 25 HYDROXY (VIT D DEFICIENCY, FRACTURES): Vit D, 25-Hydroxy: 25.7 ng/mL — ABNORMAL LOW (ref 30.0–100.0)

## 2016-12-11 LAB — LIPID PANEL
CHOLESTEROL TOTAL: 172 mg/dL (ref 100–199)
Chol/HDL Ratio: 4.8 ratio — ABNORMAL HIGH (ref 0.0–4.4)
HDL: 36 mg/dL — ABNORMAL LOW (ref >39)
LDL CALC: 87 mg/dL (ref 0–99)
Triglycerides: 246 mg/dL — ABNORMAL HIGH (ref 0–149)
VLDL Cholesterol Cal: 49 mg/dL — ABNORMAL HIGH (ref 5–40)

## 2016-12-11 LAB — CBC WITH DIFFERENTIAL/PLATELET
BASOS ABS: 0.1 10*3/uL (ref 0.0–0.2)
Basos: 1 %
EOS (ABSOLUTE): 0.5 10*3/uL — ABNORMAL HIGH (ref 0.0–0.4)
EOS: 4 %
HEMOGLOBIN: 14.1 g/dL (ref 11.1–15.9)
Hematocrit: 43.3 % (ref 34.0–46.6)
IMMATURE GRANS (ABS): 0 10*3/uL (ref 0.0–0.1)
Immature Granulocytes: 0 %
Lymphocytes Absolute: 3.6 10*3/uL — ABNORMAL HIGH (ref 0.7–3.1)
Lymphs: 31 %
MCH: 29 pg (ref 26.6–33.0)
MCHC: 32.6 g/dL (ref 31.5–35.7)
MCV: 89 fL (ref 79–97)
Monocytes Absolute: 0.9 10*3/uL (ref 0.1–0.9)
Monocytes: 7 %
Neutrophils Absolute: 6.5 10*3/uL (ref 1.4–7.0)
Neutrophils: 57 %
PLATELETS: 295 10*3/uL (ref 150–379)
RBC: 4.87 x10E6/uL (ref 3.77–5.28)
RDW: 15.8 % — ABNORMAL HIGH (ref 12.3–15.4)
WBC: 11.6 10*3/uL — AB (ref 3.4–10.8)

## 2016-12-11 LAB — CMP14+EGFR
ALBUMIN: 4.6 g/dL (ref 3.6–4.8)
ALK PHOS: 45 IU/L (ref 39–117)
ALT: 7 IU/L (ref 0–32)
AST: 15 IU/L (ref 0–40)
Albumin/Globulin Ratio: 1.8 (ref 1.2–2.2)
BUN / CREAT RATIO: 19 (ref 12–28)
BUN: 24 mg/dL (ref 8–27)
Bilirubin Total: 0.3 mg/dL (ref 0.0–1.2)
CO2: 24 mmol/L (ref 20–29)
CREATININE: 1.24 mg/dL — AB (ref 0.57–1.00)
Calcium: 9.6 mg/dL (ref 8.7–10.3)
Chloride: 99 mmol/L (ref 96–106)
GFR calc Af Amer: 53 mL/min/{1.73_m2} — ABNORMAL LOW (ref >59)
GFR calc non Af Amer: 46 mL/min/{1.73_m2} — ABNORMAL LOW (ref >59)
GLUCOSE: 86 mg/dL (ref 65–99)
Globulin, Total: 2.5 g/dL (ref 1.5–4.5)
Potassium: 4.6 mmol/L (ref 3.5–5.2)
Sodium: 142 mmol/L (ref 134–144)
Total Protein: 7.1 g/dL (ref 6.0–8.5)

## 2016-12-21 ENCOUNTER — Other Ambulatory Visit: Payer: Self-pay | Admitting: Family Medicine

## 2016-12-21 ENCOUNTER — Telehealth: Payer: Self-pay | Admitting: Family Medicine

## 2016-12-21 DIAGNOSIS — H6504 Acute serous otitis media, recurrent, right ear: Secondary | ICD-10-CM

## 2016-12-21 MED ORDER — AMOXICILLIN-POT CLAVULANATE 875-125 MG PO TABS: 1.0000 | ORAL_TABLET | Freq: Two times a day (BID) | ORAL | 0 refills | Status: DC

## 2016-12-21 NOTE — Telephone Encounter (Signed)
Please advise 

## 2016-12-21 NOTE — Telephone Encounter (Signed)
Pt notified of RX 

## 2016-12-21 NOTE — Telephone Encounter (Signed)
I sent in the requested prescription 

## 2017-01-13 ENCOUNTER — Other Ambulatory Visit: Payer: Self-pay | Admitting: Family Medicine

## 2017-01-30 ENCOUNTER — Other Ambulatory Visit: Payer: Self-pay

## 2017-01-30 ENCOUNTER — Emergency Department (HOSPITAL_BASED_OUTPATIENT_CLINIC_OR_DEPARTMENT_OTHER): Payer: Medicare Other

## 2017-01-30 ENCOUNTER — Emergency Department (HOSPITAL_BASED_OUTPATIENT_CLINIC_OR_DEPARTMENT_OTHER)
Admission: EM | Admit: 2017-01-30 | Discharge: 2017-01-30 | Disposition: A | Discharge status: Home / Self Care | Payer: Medicare Other | Attending: Emergency Medicine | Admitting: Emergency Medicine

## 2017-01-30 ENCOUNTER — Encounter (HOSPITAL_BASED_OUTPATIENT_CLINIC_OR_DEPARTMENT_OTHER): Payer: Self-pay | Admitting: *Deleted

## 2017-01-30 DIAGNOSIS — Z79899 Other long term (current) drug therapy: Secondary | ICD-10-CM | POA: Insufficient documentation

## 2017-01-30 DIAGNOSIS — L97321 Non-pressure chronic ulcer of left ankle limited to breakdown of skin: Secondary | ICD-10-CM | POA: Insufficient documentation

## 2017-01-30 DIAGNOSIS — L97329 Non-pressure chronic ulcer of left ankle with unspecified severity: Secondary | ICD-10-CM | POA: Diagnosis not present

## 2017-01-30 DIAGNOSIS — Y929 Unspecified place or not applicable: Secondary | ICD-10-CM | POA: Insufficient documentation

## 2017-01-30 DIAGNOSIS — X58XXXA Exposure to other specified factors, initial encounter: Secondary | ICD-10-CM | POA: Insufficient documentation

## 2017-01-30 DIAGNOSIS — I252 Old myocardial infarction: Secondary | ICD-10-CM | POA: Diagnosis not present

## 2017-01-30 DIAGNOSIS — M7989 Other specified soft tissue disorders: Secondary | ICD-10-CM | POA: Diagnosis not present

## 2017-01-30 DIAGNOSIS — Y9389 Activity, other specified: Secondary | ICD-10-CM | POA: Diagnosis not present

## 2017-01-30 DIAGNOSIS — Z7982 Long term (current) use of aspirin: Secondary | ICD-10-CM | POA: Insufficient documentation

## 2017-01-30 DIAGNOSIS — Y998 Other external cause status: Secondary | ICD-10-CM | POA: Diagnosis not present

## 2017-01-30 DIAGNOSIS — Z87891 Personal history of nicotine dependence: Secondary | ICD-10-CM | POA: Diagnosis not present

## 2017-01-30 DIAGNOSIS — S99912A Unspecified injury of left ankle, initial encounter: Secondary | ICD-10-CM | POA: Diagnosis present

## 2017-01-30 DIAGNOSIS — I10 Essential (primary) hypertension: Secondary | ICD-10-CM | POA: Insufficient documentation

## 2017-01-30 DIAGNOSIS — I2510 Atherosclerotic heart disease of native coronary artery without angina pectoris: Secondary | ICD-10-CM | POA: Diagnosis not present

## 2017-01-30 MED ORDER — AMOXICILLIN-POT CLAVULANATE 875-125 MG PO TABS: 1.0000 | ORAL_TABLET | Freq: Two times a day (BID) | ORAL | 0 refills | Status: DC

## 2017-01-30 MED ORDER — AMOXICILLIN-POT CLAVULANATE 875-125 MG PO TABS
1.0000 | ORAL_TABLET | Freq: Once | ORAL | Status: AC
  Administered 2017-01-30: 1 via ORAL
  Filled 2017-01-30: qty 1

## 2017-01-30 NOTE — ED Triage Notes (Signed)
Pt reports wound to L ankle d/t ill-fitting boot after an ankle injury. Reports redness and drainage -- states she has appt with orthopedist tomorrow.

## 2017-01-30 NOTE — ED Notes (Signed)
Pt and FM given d/c instructions as per chart. Rx x 1. Verbalizes understanding. No questions.

## 2017-01-30 NOTE — ED Provider Notes (Signed)
[place] EMERGENCY DEPARTMENT Provider Note   CSN: [id number] Arrival date & time: 01/30/17  1602     History   Chief Complaint Chief Complaint  Patient presents with  . Wound Check    HPI Julia West is a 61 y.o. female. Chief complaint is foot infection  HPI: 61 year old female with a history of chronic deformity of her left foot and ankle. There is marked eversion and lateral rotation of the foot at the ankle. She was seen by orthopedics in place until "leather boot". She states she's when this was several months per his been rubbing on her ankle she is now developed an ulcer that is draining and she thinks may be infected. She has appointment tomorrow to see Dr. Sharol Given of [facility] orthopedics  Past Medical History:  Diagnosis Date  . Acute myocardial infarction    with a ruptured plaque in the circumflex in 2003  . Anxiety   . Complication of anesthesia    states low O2 sats post-op 11/13  . Degenerative joint disease   . Internal and external hemorrhoids without complication   . Lymphedema of arm    right  . Morbid obesity ([place])   . Other and unspecified hyperlipidemia   . Surgical wound, non healing ABDOMINAL    has wound vac @ 125 mm Hg  . Unspecified essential hypertension     Patient Active Problem List   Diagnosis Date Noted  . Post-menopausal bleeding 04/15/2014  . Endometrial polyp 04/09/2014  . Ventral hernia 03/06/2012  . Open abdominal wall wound 03/06/2012  . Hyperlipemia 11/12/2008  . OBESITY, MORBID 11/12/2008  . Essential hypertension 11/12/2008  . CAD 11/12/2008    Past Surgical History:  Procedure Laterality Date  . APPLICATION OF WOUND VAC    . CARDIOVASCULAR STRESS TEST  01-17-2012  DR HOCHREIN   LOW RISK NUCLEAR TEST  . CESAREAN SECTION     x 4 in remote past  . CHOLECYSTECTOMY    . CORONARY ARTERY BYPASS GRAFT  2003   by Dr. Servando Snare. LIMA to the LAD, free RIMA to the circumflex. Stress perfusion study December  2009 with no high-risk areas of ischemia. She has a well-preserved ejection fraction  . DILATATION AND CURETTAGE (no specimen); HYSTEROSCOPY N/A 05/14/2014   Performed by Jonnie Kind, MD at [facility]  . ENDOMETRIAL POLYPECTOMY N/A 05/14/2014   Performed by Jonnie Kind, MD at [facility]  . HERNIA REPAIR INCISIONAL  02/07/2012   Performed by Gayland Curry, MD,FACS at [place]  . IRRIGATION AND DEBRIDEMENT OF ABD WOUND SURGICAL PREP AND PLACEMENT OF VAC N/A 05/08/2012   Performed by Theodoro Kos, DO at [facility]  . IRRIGATION AND DEBRIDEMENT OF ABDOMINAL ULCER SURGICAL PREP AND PLACEMENT OF ACELL AND VAC N/A 06/28/2012   Performed by Theodoro Kos, DO at [facility]  . IRRIGATION AND DEBRIDEMENT OF ABDOMINAL WOUND WITH POSSIBLE SURGICAL PREP AND PLACEMENT OF VAC N/A 05/15/2012   Performed by Theodoro Kos, DO at [facility]  . IRRIGATION AND DEBRIDEMENT OF ABODOMINAL WOUND WITH  SURGICAL PREP AND VAC PLACEMENT N/A 05/22/2012   Performed by Theodoro Kos, DO at [facility]  . IRRIGATION AND DEBRIDEMENT WOUND N/A 05/01/2012   Performed by Theodoro Kos, DO at [facility]  . IRRIGATION AND DEBRIDEMENT WOUND N/A 04/24/2012   Performed by Theodoro Kos, DO at [facility]  . IRRIGATION AND DEBRIDEMENT WOUND N/A  04/17/2012   Performed by Theodoro Kos, DO at [facility]  . LESION REMOVAL N/A 04/17/2012   Performed by Theodoro Kos, DO at [facility]  . SMALL BOWEL RESECTION  02/07/2012   Performed by Gayland Curry, MD,FACS at [facility] OR    OB History    No data available       Home Medications    Prior to Admission medications   Medication Sig Start Date End Date Taking? Authorizing Provider  aspirin 81 MG tablet Take 81 mg by mouth daily.    Yes [provider]  diclofenac (VOLTAREN) 75 MG EC tablet Take 1 tablet (75 mg total) by mouth 2 (two) times daily. 12/10/16   Yes [name], MD  fenofibrate 160 MG tablet TAKE 1 TABLET BY MOUTH  DAILY FOR CHOLESTEROL AND  TRIGLYCERIDES 12/10/16  Yes Stacks, Cletus Gash, MD  fish oil-omega-3 fatty acids 1000 MG capsule Take 2 g by mouth daily.     Yes [provider]  furosemide (LASIX) 20 MG tablet TAKE 1 TABLET BY MOUTH  DAILY 08/04/16  Yes Stacks, Cletus Gash, MD  meloxicam (MOBIC) 15 MG tablet Take 1 tablet (15 mg total) by mouth daily. For joint and muscle pain 10/25/16  Yes Stacks, Cletus Gash, MD  metoprolol tartrate (LOPRESSOR) 50 MG tablet Take 0.5 tablets (25 mg total) by mouth 2 (two) times daily. 12/10/16  Yes [name], MD  nitroGLYCERIN (NITROSTAT) 0.4 MG SL tablet Place 1 tablet (0.4 mg total) under the tongue every 5 (five) minutes x 3 doses as needed for chest pain. If pain persist after call 911 01/08/16 01/30/17 Yes Hochrein, Jeneen Rinks, MD  traZODone (DESYREL) 150 MG tablet TAKE 1 OR 2 TABLETS BY  MOUTH AT BEDTIME AS NEEDED  FOR SLEEP 01/13/17  Yes Stacks, Cletus Gash, MD  valsartan-hydrochlorothiazide (DIOVAN-HCT) 160-12.5 MG tablet Take 1 tablet by mouth daily. 12/10/16  Yes [name], MD  amoxicillin-clavulanate (AUGMENTIN) 875-125 MG tablet Take 1 tablet 2 (two) times daily by mouth. 01/30/17   Tanna Furry, MD  Multiple Vitamin (MULTIVITAMIN) tablet Take 1 tablet by mouth daily.    [provider]    Family History Family History  Problem Relation Age of Onset  . Heart attack Mother   . Hypertension Sister   . Diabetes Brother   . Birth defects Son        heart defect    Social History Social History   Tobacco Use  . Smoking status: Former Smoker    Packs/day: 1.00    Years: 25.00    Pack years: 25.00    Types: Cigarettes    Last attempt to quit: 11/17/2011    Years since quitting: 5.2  . Smokeless tobacco: Never Used  Substance Use Topics  . Alcohol use: No  . Drug use: No     Allergies   Ace inhibitors   Review of Systems Review of Systems  Constitutional: Negative for  appetite change, chills, diaphoresis, fatigue and fever.  HENT: Negative for mouth sores, sore throat and trouble swallowing.   Eyes: Negative for visual disturbance.  Respiratory: Negative for cough, chest tightness, shortness of breath and wheezing.   Cardiovascular: Negative for chest pain.  Gastrointestinal: Negative for abdominal distention, abdominal pain, diarrhea, nausea and vomiting.  Endocrine: Negative for polydipsia, polyphagia and polyuria.  Genitourinary: Negative for dysuria, frequency and hematuria.  Musculoskeletal: Negative for gait problem.  Skin: Positive for wound. Negative for color change, pallor and rash.  Neurological: Negative for  dizziness, syncope, light-headedness and headaches.  Hematological: Does not bruise/bleed easily.  Psychiatric/Behavioral: Negative for behavioral problems and confusion.     Physical Exam Updated Vital Signs BP (!) 113/54 (BP Location: Left Arm)   Pulse 66   Temp 97.9 F (36.6 C) (Oral)   Resp 20   Ht 5\' 6"  (1.676 m)   Wt 127 kg (280 lb)   SpO2 94%   BMI 45.19 kg/m   Physical Exam  Constitutional: She is oriented to person, place, and time. She appears well-developed and well-nourished. No distress.  HENT:  Head: Normocephalic.  Eyes: Conjunctivae are normal. Pupils are equal, round, and reactive to light. No scleral icterus.  Neck: Normal range of motion. Neck supple. No thyromegaly present.  Cardiovascular: Normal rate and regular rhythm. Exam reveals no gallop and no friction rub.  No murmur heard. Pulmonary/Chest: Effort normal and breath sounds normal. No respiratory distress. She has no wheezes. She has no rales.  Abdominal: Soft. Bowel sounds are normal. She exhibits no distension. There is no tenderness. There is no rebound.  Musculoskeletal: Normal range of motion.  Evaluation of the left foot shows lateral rotation and eversion of the talotibial joint. This appears very chronic deformity and gives prominence of  the medial malleolus. She has a thickened edged ulcer overlying this. It is straining some turbid fluid. There is minimal surrounding erythema approximately 3 cm radially and each direction. No proximal lymphangitic spread.  Neurological: She is alert and oriented to person, place, and time.  Skin: Skin is warm and dry. No rash noted.  Psychiatric: She has a normal mood and affect. Her behavior is normal.     ED Treatments / Results  Labs (all labs ordered are listed, but only abnormal results are displayed) Labs Reviewed - No data to display  EKG  EKG Interpretation None       Radiology Dg Ankle 2 Views Left  Result Date: 01/30/2017 CLINICAL DATA:  61 year old female with skin wound over the lateral malleolus. Redness and swelling and drainage from the wound. EXAM: LEFT ANKLE - 2 VIEW COMPARISON:  Left foot radiograph dated 07/01/2016 FINDINGS: Evaluation of the ankle is limited due to suboptimal positioning of the foot. There is diffuse soft tissue swelling of the ankle. There is a small amorphous calcific density in the soft tissues over the lateral malleolus which may represent a displaced avulsed fracture. No other acute fracture or dislocation identified. The bones are osteopenic. There is degenerative changes at the ankle joint as well as degenerative changes of the tarsal joints with bone spurring. There is also soft tissue swelling over the medial hindfoot with a 7 mm deep skin ulcer with overlying dressing. There is pes planus. IMPRESSION: 1. Diffuse soft tissue swelling of the ankle. A small amorphous calcific density in the soft tissues of the lateral aspect of the ankle may represent an avulsed fracture possibly from the tip of the lateral malleolus. Evaluation is limited on the provided images due to suboptimal positioning of the foot. 2. Soft tissue swelling and skin ulcer over the medial aspect of the hindfoot. 3. Osteopenia with degenerative changes of the ankle.  Electronically Signed   By: Anner Crete M.D.   On: 01/30/2017 18:47    Procedures Procedures (including critical care time)  Medications Ordered in ED Medications  amoxicillin-clavulanate (AUGMENTIN) 875-125 MG per tablet 1 tablet (1 tablet Oral Given 01/30/17 1924)     Initial Impression / Assessment and Plan / ED Course  I have  reviewed the triage vital signs and the nursing notes.  Pertinent labs & imaging results that were available during my care of the patient were reviewed by me and considered in my medical decision making (see chart for details).  Clinical Course as of Jan 31 2204  Sun Jan 30, 2017  1900 DG Ankle 2 Views Left [MJ]  1900 DG Ankle 2 Views Left [MJ]  1900 DG Ankle 2 Views Left [MJ]  1900 DG Ankle 2 Views Left [MJ]    Clinical Course User Index [MJ] Tanna Furry, MD   His constitutional symptoms. Leg is not swollen. Neck is or for DVT. X-ray does not show obvious bony distraction. Given Augmentin. Prescription for the same. Wet-to-dry dressing and Ace wrap placed. Perfect follow-up with her seeing Dr. Sharol Given tomorrow.  Final Clinical Impressions(s) / ED Diagnoses   Final diagnoses:  Skin ulcer of left ankle, limited to breakdown of skin [facility])    ED Discharge Orders        Ordered    amoxicillin-clavulanate (AUGMENTIN) 875-125 MG tablet  2 times daily     01/30/17 1908       Tanna Furry, MD 01/30/17 2211

## 2017-01-30 NOTE — Discharge Instructions (Signed)
Antibiotic as prescribed. Change dressing daily with clean gauze. See Dr. Sharol Given tomorrow as scheduled.

## 2017-01-31 ENCOUNTER — Ambulatory Visit (INDEPENDENT_AMBULATORY_CARE_PROVIDER_SITE_OTHER): Payer: Medicare Other | Admitting: Orthopedic Surgery

## 2017-01-31 ENCOUNTER — Ambulatory Visit (INDEPENDENT_AMBULATORY_CARE_PROVIDER_SITE_OTHER): Payer: Self-pay

## 2017-01-31 ENCOUNTER — Encounter (INDEPENDENT_AMBULATORY_CARE_PROVIDER_SITE_OTHER): Payer: Self-pay | Admitting: Orthopedic Surgery

## 2017-01-31 VITALS — Ht 66.0 in | Wt 280.0 lb

## 2017-01-31 DIAGNOSIS — E11610 Type 2 diabetes mellitus with diabetic neuropathic arthropathy: Secondary | ICD-10-CM | POA: Diagnosis not present

## 2017-01-31 DIAGNOSIS — M86272 Subacute osteomyelitis, left ankle and foot: Secondary | ICD-10-CM

## 2017-01-31 MED ORDER — DOXYCYCLINE HYCLATE 100 MG PO TABS: 100.0000 mg | ORAL_TABLET | Freq: Two times a day (BID) | ORAL | 0 refills | Status: DC

## 2017-01-31 NOTE — Addendum Note (Signed)
Addended by: Azucena Cecil on: 01/31/2017 07:03 PM   Modules accepted: Orders

## 2017-01-31 NOTE — Progress Notes (Addendum)
Office Visit Note   Patient: Julia West           Date of Birth: December 15, 1955           MRN: [id number] Visit Date: 01/31/2017              Requested by: [name], MD [place],  [id number] PCP: [name], MD  Chief Complaint  Patient presents with  . Left Foot - Open Wound    To ER yesterday for ulcer       HPI: Patient is a 61 year old woman who presents with Charcot collapse of the left foot.  She went to the emergency room yesterday radiographs were obtained of her ankle she was told there was no evidence of osteomyelitis of the ankle.  She advised to perform a wet-to-dry dressing change she was given a prescription for Augmentin she has not filled.  Patient states she has a history of lymphedema she states she is not sure whether she is diabetic.  She states that she has seen 3 different orthopedic doctors at [place] and was referred to [facility] for an ankle foot orthosis.  Patient complains of redness and drainage from the ulcer over the talar head.  Assessment & Plan: Visit Diagnoses:  1. 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 Subacute osteomyelitis, left ankle and foot ([place])     Plan: Will need to proceed with excision of the infected bone of the talar head with reconstruction of the medial column with intramedullary screw fixation.  Discussed that patient has an increased risk of the wound not healing risk of potential amputation of the foot.  Discussed that she would need to be strict nonweightbearing after surgery.  Discussed that with weightbearing she could lose reduction and could break down the incision which would require amputation.  Discussed that she may require discharge to skilled nursing.  A prescription is called in for doxycycline.  We will set up surgery as soon as possible.  Follow-Up Instructions: No Follow-up on file.   Ortho Exam  Patient is alert, oriented, no adenopathy,  well-dressed, normal affect, normal respiratory effort. Examination patient has a good dorsalis pedis pulse.  She has a severe pronation and valgus deformity through the midfoot with a severe Charcot collapse.  She has ulcer over the medial border of the talar head.  This was probed with a Q-tip and the acute trip probed 1 cm deep down to the talar head.  There is no ascending cellulitis there is no abscess no drainage.  Patient does not have protective sensation.  Clinically patient has classic destructive changes of her foot with diabetic Charcot arthropathy.  Imaging: Dg Ankle 2 Views Left  Result Date: 01/30/2017 CLINICAL DATA:  61 year old female with skin wound over the lateral malleolus. Redness and swelling and drainage from the wound. EXAM: LEFT ANKLE - 2 VIEW COMPARISON:  Left foot radiograph dated 07/01/2016 FINDINGS: Evaluation of the ankle is limited due to suboptimal positioning of the foot. There is diffuse soft tissue swelling of the ankle. There is a small amorphous calcific density in the soft tissues over the lateral malleolus which may represent a displaced avulsed fracture. No other acute fracture or dislocation identified. The bones are osteopenic. There is degenerative changes at the ankle joint as well as degenerative changes of the tarsal joints with bone spurring. There is also soft tissue swelling over the medial hindfoot with a 7 mm  deep skin ulcer with overlying dressing. There is pes planus. IMPRESSION: 1. Diffuse soft tissue swelling of the ankle. A small amorphous calcific density in the soft tissues of the lateral aspect of the ankle may represent an avulsed fracture possibly from the tip of the lateral malleolus. Evaluation is limited on the provided images due to suboptimal positioning of the foot. 2. Soft tissue swelling and skin ulcer over the medial aspect of the hindfoot. 3. Osteopenia with degenerative changes of the ankle. Electronically Signed   By: Anner Crete  M.D.   On: 01/30/2017 18:47   No images are attached to the encounter.  Labs: Lab Results  Component Value Date   REPTSTATUS 04/29/2012 FINAL 04/24/2012   REPTSTATUS 04/27/2012 FINAL 04/24/2012   GRAMSTAIN  04/24/2012    NO WBC SEEN NO SQUAMOUS EPITHELIAL CELLS SEEN MODERATE GRAM POSITIVE COCCI IN PAIRS RARE GRAM NEGATIVE RODS   GRAMSTAIN  04/24/2012    NO WBC SEEN NO SQUAMOUS EPITHELIAL CELLS SEEN MODERATE GRAM POSITIVE COCCI IN PAIRS RARE GRAM NEGATIVE RODS   CULT NO ANAEROBES ISOLATED 04/24/2012   CULT MODERATE PROTEUS MIRABILIS 04/24/2012   LABORGA PROTEUS MIRABILIS 04/24/2012    Orders:  Orders Placed This Encounter  Procedures  . XR Foot Complete Left   No orders of the defined types were placed in this encounter.    Procedures: No procedures performed  Clinical Data: No additional findings.  ROS:  All other systems negative, except as noted in the HPI. Review of Systems  Objective: Vital Signs: Ht 5\' 6"  (1.676 m)   Wt 280 lb (127 kg)   BMI 45.19 kg/m   Specialty Comments:  No specialty comments available.  PMFS History: Patient Active Problem List   Diagnosis Date Noted  . Post-menopausal bleeding 04/15/2014  . Endometrial polyp 04/09/2014  . Ventral hernia 03/06/2012  . Open abdominal wall wound 03/06/2012  . Hyperlipemia 11/12/2008  . OBESITY, MORBID 11/12/2008  . Essential hypertension 11/12/2008  . CAD 11/12/2008   Past Medical History:  Diagnosis Date  . Acute myocardial infarction    with a ruptured plaque in the circumflex in 2003  . Anxiety   . Complication of anesthesia    states low O2 sats post-op 11/13  . Degenerative joint disease   . Internal and external hemorrhoids without complication   . Lymphedema of arm    right  . Morbid obesity ([place])   . Other and unspecified hyperlipidemia   . Surgical wound, non healing ABDOMINAL    has wound vac @ 125 mm Hg  . Unspecified essential hypertension     Family History    Problem Relation Age of Onset  . Heart attack Mother   . Hypertension Sister   . Diabetes Brother   . Birth defects Son        heart defect    Past Surgical History:  Procedure Laterality Date  . APPLICATION OF WOUND VAC    . CARDIOVASCULAR STRESS TEST  01-17-2012  DR HOCHREIN   LOW RISK NUCLEAR TEST  . CESAREAN SECTION     x 4 in remote past  . CHOLECYSTECTOMY    . CORONARY ARTERY BYPASS GRAFT  2003   by Dr. Servando Snare. LIMA to the LAD, free RIMA to the circumflex. Stress perfusion study December 2009 with no high-risk areas of ischemia. She has a well-preserved ejection fraction  . DILATATION AND CURETTAGE (no specimen); HYSTEROSCOPY N/A 05/14/2014   Performed by Jonnie Kind, MD at [facility]  .  ENDOMETRIAL POLYPECTOMY N/A 05/14/2014   Performed by Jonnie Kind, MD at [facility]  . HERNIA REPAIR INCISIONAL  02/07/2012   Performed by Gayland Curry, MD,FACS at [place]  . IRRIGATION AND DEBRIDEMENT OF ABD WOUND SURGICAL PREP AND PLACEMENT OF VAC N/A 05/08/2012   Performed by Theodoro Kos, DO at [facility]  . IRRIGATION AND DEBRIDEMENT OF ABDOMINAL ULCER SURGICAL PREP AND PLACEMENT OF ACELL AND VAC N/A 06/28/2012   Performed by Theodoro Kos, DO at [facility]  . IRRIGATION AND DEBRIDEMENT OF ABDOMINAL WOUND WITH POSSIBLE SURGICAL PREP AND PLACEMENT OF VAC N/A 05/15/2012   Performed by Theodoro Kos, DO at [facility]  . IRRIGATION AND DEBRIDEMENT OF ABODOMINAL WOUND WITH  SURGICAL PREP AND VAC PLACEMENT N/A 05/22/2012   Performed by Theodoro Kos, DO at [facility]  . IRRIGATION AND DEBRIDEMENT WOUND N/A 05/01/2012   Performed by Theodoro Kos, DO at [facility]  . IRRIGATION AND DEBRIDEMENT WOUND N/A 04/24/2012   Performed by Theodoro Kos, DO at [facility]  . IRRIGATION AND DEBRIDEMENT WOUND N/A 04/17/2012   Performed by Theodoro Kos, DO at [facility]  . LESION REMOVAL N/A  04/17/2012   Performed by Theodoro Kos, DO at [facility]  . SMALL BOWEL RESECTION  02/07/2012   Performed by Gayland Curry, MD,FACS at [place] History   Occupational History  . Occupation: Estate manager/land agent    Comment: In the [employer]  Tobacco Use  . Smoking status: Former Smoker    Packs/day: 1.00    Years: 25.00    Pack years: 25.00    Types: Cigarettes    Last attempt to quit: 11/17/2011    Years since quitting: 5.2  . Smokeless tobacco: Never Used  Substance and Sexual Activity  . Alcohol use: No  . Drug use: No  . Sexual activity: Yes    Birth control/protection: Post-menopausal

## 2017-02-01 ENCOUNTER — Telehealth (INDEPENDENT_AMBULATORY_CARE_PROVIDER_SITE_OTHER): Payer: Self-pay | Admitting: Orthopedic Surgery

## 2017-02-01 ENCOUNTER — Encounter: Payer: Self-pay | Admitting: Family Medicine

## 2017-02-01 NOTE — Telephone Encounter (Signed)
I called and spoke with patients daughter [name]. Due to information she received yesterday she had been thinking of several questions after processing this information. She had spoken with friends and is wondering if patients needs to be on IV ABX, even though she was placed on doxycycline yesterday and was already taking augmentin. She is worried that next week would be too long to wait. She did not know if it was due to it being a holiday. I did advised that Dr. Sharol Given was on call this holiday. I did have Dr. Sharol Given come to phone to speak with [facility] as well. He did offer to do surgery tomorrow 02/02/17, that the decision was up to them ultimately. He gave Cheryl's direct line she will discuss with her mother what they would like to do. Patient is wanting to hold off until next week. Surgery will be next Wednesday.

## 2017-02-01 NOTE — Telephone Encounter (Signed)
Patient was seen yesterday by Dr. Sharol Given.  After processing all the information from yesterday, Julia West (her daughter who was with her yesterday during the appointment) has some questions and would like someone to call her.  CB#[phone].  Thank you.

## 2017-02-02 ENCOUNTER — Telehealth (INDEPENDENT_AMBULATORY_CARE_PROVIDER_SITE_OTHER): Payer: Self-pay | Admitting: Orthopedic Surgery

## 2017-02-02 ENCOUNTER — Telehealth (INDEPENDENT_AMBULATORY_CARE_PROVIDER_SITE_OTHER): Payer: Self-pay

## 2017-02-02 ENCOUNTER — Telehealth: Payer: Self-pay | Admitting: Cardiology

## 2017-02-02 NOTE — Telephone Encounter (Signed)
I called and advised surgery will be 02-09-17.  I scheduled patient to see their PA, Suanne Marker next Tuesday, 02-08-17.

## 2017-02-02 NOTE — Telephone Encounter (Signed)
Patient called needing to know when should she stop taking her low dose aspirin. Patient advised she was asked that she call Dr Gavin Potters office to get cardiac clearence. The number to contact patient is [phone]

## 2017-02-02 NOTE — Telephone Encounter (Signed)
    Chart reviewed as part of pre-operative protocol coverage. Because of Julia West's past medical history and time since last visit, he/she will require a follow-up visit in order to better assess preoperative cardiovascular risk. This has already been scheduled per intake note ("Patient has not been seen in over year.  Scheduled her with Rosaria Ferries 02/08/17 11a").  Pre-op covering staff: - Please contact requesting surgeon's office via preferred method (i.e, phone, fax) to inform them of need for appointment prior to surgery.  Charlie Pitter, PA-C  02/02/2017, 2:51 PM

## 2017-02-02 NOTE — Telephone Encounter (Signed)
I sent  this to you since Julia West is out of the office. Thanks.

## 2017-02-02 NOTE — Telephone Encounter (Signed)
Julia West From [facility] called and states she received preop clearance today. Patient is scheduled for SU tomorrow. (EXCISION TALAR HEAD, INTERNAL FIXATION MEDIAL COLUMN LEFT FOOT).   Would like a Call back ASAP  [phone]  Ask for Pre op callback pool.

## 2017-02-02 NOTE — Telephone Encounter (Signed)
I called patient to advise cardiologist would like to see her. Appointment scheduled by [place]. Advised patient 11am appointment to arrive at 1045 to see Rosaria Ferries PA-C at [facility]. She can continue aspirin.

## 2017-02-02 NOTE — Telephone Encounter (Signed)
New Message   Patient has not been seen in over year.  Scheduled her with Rosaria Ferries 02/08/17 11a     [facility] Pre-operative Risk Assessment    Request for surgical clearance:  1. What type of surgery is being performed? Excision taylorhead with internal fixation left foot   2. When is this surgery scheduled?  02/03/17  3. Are there any medications that need to be held prior to surgery and how long? no  4. Practice name and name of physician performing surgery?  Dr Sharol Given   5. What is your office phone and fax number?  [facility] fax [phone]  6. Anesthesia type (None, local, MAC, general) ?  General    Howie Ill 02/02/2017, 1:19 PM  _________________________________________________________________   (provider comments below)

## 2017-02-02 NOTE — Telephone Encounter (Signed)
Left a message for Dr. Jess Barters Triage RN to call back re: clearance we received for pts procedure scheduled for tomorrow, 02/03/17. Pt hasn't been seen in over a year and pt will need an appt (02/08/17) before she can be cleared.

## 2017-02-02 NOTE — Telephone Encounter (Signed)
New message    Pt wants to know if there is anything in her blood work that would show that she is a diabetic?    She has a wound on  Her foot that is not healing

## 2017-02-02 NOTE — Telephone Encounter (Signed)
I called patient to advise cardiologist would like to see her. Appointment scheduled by [place]. Advised patient 11am appointment to arrive at 1045 to see Rosaria Ferries PA-C at [facility]. She can continue aspirin.

## 2017-02-02 NOTE — Telephone Encounter (Signed)
Judeen Hammans from [facility] called back to let us know pt already has an appt for 02/08/17 with Rosaria Ferries, PA-C for cardiac clearance.

## 2017-02-02 NOTE — Telephone Encounter (Signed)
I spoke w patient. She sees Dr. Percival Spanish annually and follows w PCP every 3 months.  States she has had a non-healing wound for several months. 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 They will plan to do some type of surgery which may include amputation.  Pt understandably upset about this. However, she does not note a history of DM and last glucose values have been normal range. She has never been advised that she might be diabetic.  I gave pt encouragement to call her PCP to look into this further, she states she will do so. I also let her know that if orthopedist requires cardiac clearance they will send [place] a request and proceed from there. She voiced understanding and thanks.

## 2017-02-02 NOTE — Telephone Encounter (Signed)
Patient left a message stating that she needs you all to call her cardiologist at [phone] for pre-op testing clearance.  Patient's CB#[phone].  Thank you.

## 2017-02-04 NOTE — Pre-Procedure Instructions (Signed)
MARGUERITE BARBA  02/04/2017      [id number] - [place], [place] - [address] [place] [address] Phone: [phone] Fax: [phone]  [id number] - [place], [place] [address] Phone: [phone] Fax: [phone]  [place], [facility] [address] [place] [address] Phone: [phone] Fax: [phone]    Your procedure is scheduled on Wednesday, February 09, 2017  Report to [facility] Admitting Entrance "A" at 9:00AM   Call this number if you have problems the morning of surgery:  [phone]   Remember:  Do not eat food or drink liquids after midnight.  Take these medicines the morning of surgery with A SIP OF WATER: Doxycycline (VIBRA-TABS) and Metoprolol tartrate (LOPRESSOR). If needed NitroGLYCERIN (NITROSTAT) for chest pain (Notify the staff if you had to take this medicine).  Follow your doctor's instruction regarding Aspirin.  As of today, stop taking all Aspirin products, Vitamins, Fish oils, and Herbal medications. Also stop all NSAIDS i.e. Advil, Ibuprofen, Motrin, Aleve, Anaprox, Naproxen, BC and Goody Powders.   Do not wear jewelry, make-up or nail polish.  Do not wear lotions, powders, perfumes, or deodorant.  Do not shave 48 hours prior to surgery.    Do not bring valuables to the hospital.  [facility] is not responsible for any belongings or valuables.  Contacts, dentures or bridgework may not be worn into surgery.  Leave your suitcase in the car.  After surgery it may be brought to your room.  For patients admitted to the hospital, discharge time will be determined by your treatment team.  Patients discharged the day of surgery will not be allowed to drive home.   Special instructions:   [facility]- Preparing For Surgery  Before  surgery, you can play an important role. Because skin is not sterile, your skin needs to be as free of germs as possible. You can reduce the number of germs on your skin by washing with CHG (chlorahexidine gluconate) Soap before surgery.  CHG is an antiseptic cleaner which kills germs and bonds with the skin to continue killing germs even after washing.  Please do not use if you have an allergy to CHG or antibacterial soaps. If your skin becomes reddened/irritated stop using the CHG.  Do not shave (including legs and underarms) for at least 48 hours prior to first CHG shower. It is OK to shave your face.  Please follow these instructions carefully.   1. Shower the NIGHT BEFORE SURGERY and the MORNING OF SURGERY with CHG.   2. If you chose to wash your hair, wash your hair first as usual with your normal shampoo.  3. After you shampoo, rinse your hair and body thoroughly to remove the shampoo.  4. Use CHG as you would any other liquid soap. You can apply CHG directly to the skin and wash gently with a scrungie or a clean washcloth.   5. Apply the CHG Soap to your body ONLY FROM THE NECK DOWN.  Do not use on open wounds or open sores. Avoid contact with your eyes, ears, mouth and genitals (private parts). Wash Face and genitals (private parts)  with your normal soap.  6. Wash thoroughly, paying special attention to the area where your surgery will be performed.  7. Thoroughly rinse your body  with warm water from the neck down.  8. DO NOT shower/wash with your normal soap after using and rinsing off the CHG Soap.  9. Pat yourself dry with a CLEAN TOWEL.  10. Wear CLEAN PAJAMAS to bed the night before surgery, wear comfortable clothes the morning of surgery  11. Place CLEAN SHEETS on your bed the night of your first shower and DO NOT SLEEP WITH PETS.  Day of Surgery: Do not apply any deodorants/lotions. Please wear clean clothes to the hospital/surgery center.    Please read over the  following fact sheets that you were given. Pain Booklet, Coughing and Deep Breathing and Surgical Site Infection Prevention

## 2017-02-07 ENCOUNTER — Other Ambulatory Visit (INDEPENDENT_AMBULATORY_CARE_PROVIDER_SITE_OTHER): Payer: Self-pay | Admitting: Family

## 2017-02-07 ENCOUNTER — Encounter (HOSPITAL_COMMUNITY): Payer: Self-pay

## 2017-02-07 ENCOUNTER — Other Ambulatory Visit (HOSPITAL_COMMUNITY): Payer: Medicare Other

## 2017-02-07 ENCOUNTER — Encounter (HOSPITAL_COMMUNITY)
Admission: RE | Admit: 2017-02-07 | Discharge: 2017-02-07 | Disposition: A | Discharge status: Home / Self Care | Payer: Medicare Other | Source: Ambulatory Visit | Attending: Orthopedic Surgery | Admitting: Orthopedic Surgery

## 2017-02-07 ENCOUNTER — Other Ambulatory Visit: Payer: Self-pay

## 2017-02-07 DIAGNOSIS — I10 Essential (primary) hypertension: Secondary | ICD-10-CM

## 2017-02-07 DIAGNOSIS — I2510 Atherosclerotic heart disease of native coronary artery without angina pectoris: Secondary | ICD-10-CM | POA: Diagnosis not present

## 2017-02-07 DIAGNOSIS — M86272 Subacute osteomyelitis, left ankle and foot: Secondary | ICD-10-CM | POA: Diagnosis not present

## 2017-02-07 DIAGNOSIS — I252 Old myocardial infarction: Secondary | ICD-10-CM | POA: Diagnosis not present

## 2017-02-07 DIAGNOSIS — R001 Bradycardia, unspecified: Secondary | ICD-10-CM

## 2017-02-07 DIAGNOSIS — Z01810 Encounter for preprocedural cardiovascular examination: Secondary | ICD-10-CM

## 2017-02-07 DIAGNOSIS — Z8249 Family history of ischemic heart disease and other diseases of the circulatory system: Secondary | ICD-10-CM | POA: Diagnosis not present

## 2017-02-07 DIAGNOSIS — R0600 Dyspnea, unspecified: Secondary | ICD-10-CM | POA: Insufficient documentation

## 2017-02-07 DIAGNOSIS — E785 Hyperlipidemia, unspecified: Secondary | ICD-10-CM | POA: Insufficient documentation

## 2017-02-07 DIAGNOSIS — Z9049 Acquired absence of other specified parts of digestive tract: Secondary | ICD-10-CM

## 2017-02-07 DIAGNOSIS — Z01812 Encounter for preprocedural laboratory examination: Secondary | ICD-10-CM | POA: Insufficient documentation

## 2017-02-07 DIAGNOSIS — Z01818 Encounter for other preprocedural examination: Secondary | ICD-10-CM | POA: Diagnosis not present

## 2017-02-07 DIAGNOSIS — I890 Lymphedema, not elsewhere classified: Secondary | ICD-10-CM | POA: Insufficient documentation

## 2017-02-07 DIAGNOSIS — I517 Cardiomegaly: Secondary | ICD-10-CM

## 2017-02-07 DIAGNOSIS — F419 Anxiety disorder, unspecified: Secondary | ICD-10-CM

## 2017-02-07 DIAGNOSIS — Z9889 Other specified postprocedural states: Secondary | ICD-10-CM

## 2017-02-07 DIAGNOSIS — Z951 Presence of aortocoronary bypass graft: Secondary | ICD-10-CM | POA: Diagnosis not present

## 2017-02-07 DIAGNOSIS — Z85828 Personal history of other malignant neoplasm of skin: Secondary | ICD-10-CM

## 2017-02-07 DIAGNOSIS — E1169 Type 2 diabetes mellitus with other specified complication: Secondary | ICD-10-CM | POA: Diagnosis not present

## 2017-02-07 DIAGNOSIS — Z888 Allergy status to other drugs, medicaments and biological substances status: Secondary | ICD-10-CM | POA: Diagnosis not present

## 2017-02-07 DIAGNOSIS — E11610 Type 2 diabetes mellitus with diabetic neuropathic arthropathy: Secondary | ICD-10-CM | POA: Diagnosis not present

## 2017-02-07 DIAGNOSIS — Z87891 Personal history of nicotine dependence: Secondary | ICD-10-CM | POA: Diagnosis not present

## 2017-02-07 HISTORY — DX: Dyspnea, unspecified: R06.00

## 2017-02-07 HISTORY — DX: Lymphedema, not elsewhere classified: I89.0

## 2017-02-07 HISTORY — DX: Malignant (primary) neoplasm, unspecified: C80.1

## 2017-02-07 LAB — CBC
HEMATOCRIT: 42.2 % (ref 36.0–46.0)
HEMOGLOBIN: 13.8 g/dL (ref 12.0–15.0)
MCH: 29.3 pg (ref 26.0–34.0)
MCHC: 32.7 g/dL (ref 30.0–36.0)
MCV: 89.6 fL (ref 78.0–100.0)
Platelets: 308 10*3/uL (ref 150–400)
RBC: 4.71 MIL/uL (ref 3.87–5.11)
RDW: 14.8 % (ref 11.5–15.5)
WBC: 11.6 10*3/uL — ABNORMAL HIGH (ref 4.0–10.5)

## 2017-02-07 LAB — BASIC METABOLIC PANEL
ANION GAP: 10 (ref 5–15)
BUN: 37 mg/dL — ABNORMAL HIGH (ref 6–20)
CALCIUM: 9.3 mg/dL (ref 8.9–10.3)
CO2: 25 mmol/L (ref 22–32)
Chloride: 103 mmol/L (ref 101–111)
Creatinine, Ser: 1.39 mg/dL — ABNORMAL HIGH (ref 0.44–1.00)
GFR calc non Af Amer: 39 mL/min — ABNORMAL LOW (ref >60)
GFR, EST AFRICAN AMERICAN: 46 mL/min — AB (ref >60)
Glucose, Bld: 128 mg/dL — ABNORMAL HIGH (ref 65–99)
POTASSIUM: 3.8 mmol/L (ref 3.5–5.1)
Sodium: 138 mmol/L (ref 135–145)

## 2017-02-07 NOTE — Pre-Procedure Instructions (Signed)
Julia West  02/07/2017    Your procedure is scheduled on Wednesday, February 09, 2017  Report to [facility] Admitting Entrance "A" at [place] surgery or procedure is scheduled for 11:00 AM   Call this number if you have problems the morning of surgery:[phone]--pre op desk number                             Remember:  Do not eat food or drink liquids after midnight Tuesday, November 27.   Take these medicines the morning of surgery with A SIP OF WATER:  Metoprolol tartrate (Lopressor).   May take Nitroglycerin if needed and notify nurse that you took it.  STOP taking  Aspirin Products (Goody Powder, Excedrin Migraine), Ibuprofen (Advil), Naproxen (Aleve), Vitamins and Herbal Products (ie Fish Oil)  Special instructions:   [facility]- Preparing For Surgery  Before surgery, you can play an important role. Because skin is not sterile, your skin needs to be as free of germs as possible. You can reduce the number of germs on your skin by washing with CHG (chlorahexidine gluconate) Soap before surgery.  CHG is an antiseptic cleaner which kills germs and bonds with the skin to continue killing germs even after washing.  Please do not use if you have an allergy to CHG or antibacterial soaps. If your skin becomes reddened/irritated stop using the CHG.  Do not shave (including legs and underarms) for at least 48 hours prior to first CHG shower. It is OK to shave your face.  Please follow these instructions carefully.   1. Shower the NIGHT BEFORE SURGERY and the MORNING OF SURGERY with CHG.   2. If you chose to wash your hair, wash your hair first as usual with your normal shampoo.  3. After you shampoo, rinse your hair and body thoroughly to remove the shampoo.  4. Use CHG as you would any other liquid soap. You can apply CHG directly to the skin and wash gently with a scrungie or a clean washcloth.   5. Apply the CHG Soap to your body  ONLY FROM THE NECK DOWN.  Do not use on open wounds or open sores. Avoid contact with your eyes, ears, mouth and genitals (private parts). Wash Face and genitals (private parts)  with your normal soap.  6. Wash thoroughly, paying special attention to the area where your surgery will be performed.  7. Thoroughly rinse your body with warm water from the neck down.  8. DO NOT shower/wash with your normal soap after using and rinsing off the CHG Soap.  9. Pat yourself dry with a CLEAN TOWEL.  10. Wear CLEAN PAJAMAS to bed the night before surgery, wear comfortable clothes the morning of surgery  11. Place CLEAN SHEETS on your bed the night of your first shower and DO NOT SLEEP WITH PETS.  Day of Surgery: shower as above Do not apply any deodorants/lotionspowders and colognes Please wear clean clothes to the hospital/surgery center.               Do not wear jewelry, make-up or nail polish.  Do not shave 48 hours prior to surgery.    Do not bring valuables to the hospital.  [facility] is not responsible for any belongings or valuables.  Contacts, dentures or bridgework may not be  worn into surgery.  Leave your suitcase in the car.  After surgery it may be brought to your room.  For patients admitted to the hospital, discharge time will be determined by your treatment team.  Patients discharged the day of surgery will not be allowed to drive home.   Please read over the following fact sheets that you were given. Pain Booklet, Coughing and Deep Breathing and Surgical Site Infection Prevention

## 2017-02-07 NOTE — Pre-Procedure Instructions (Signed)
Julia West  02/07/2017    Your procedure is scheduled on Wednesday, February 09, 2017  Report to [facility] Admitting Entrance "A" at [place] surgery or procedure is scheduled for 11:00 AM   Call this number if you have problems the morning of [phone]--pre op desk number                             Remember:  Do not eat food or drink liquids after midnight Tuesday, November 27.   Take these medicines the morning of surgery with A SIP OF WATER:  Metoprolol tartrate (Lopressor).   May take Nitroglycerin if needed and notify nurse that you took it.  Special instructions:   [facility]- Preparing For Surgery  Before surgery, you can play an important role. Because skin is not sterile, your skin needs to be as free of germs as possible. You can reduce the number of germs on your skin by washing with CHG (chlorahexidine gluconate) Soap before surgery.  CHG is an antiseptic cleaner which kills germs and bonds with the skin to continue killing germs even after washing.  Please do not use if you have an allergy to CHG or antibacterial soaps. If your skin becomes reddened/irritated stop using the CHG.  Do not shave (including legs and underarms) for at least 48 hours prior to first CHG shower. It is OK to shave your face.  Please follow these instructions carefully.   1. Shower the NIGHT BEFORE SURGERY and the MORNING OF SURGERY with CHG.   2. If you chose to wash your hair, wash your hair first as usual with your normal shampoo.  3. After you shampoo, rinse your hair and body thoroughly to remove the shampoo.  4. Use CHG as you would any other liquid soap. You can apply CHG directly to the skin and wash gently with a scrungie or a clean washcloth.   5. Apply the CHG Soap to your body ONLY FROM THE NECK DOWN.  Do not use on open wounds or open sores. Avoid contact with your eyes, ears, mouth and genitals (private parts). Wash Face  and genitals (private parts)  with your normal soap.  6. Wash thoroughly, paying special attention to the area where your surgery will be performed.  7. Thoroughly rinse your body with warm water from the neck down.  8. DO NOT shower/wash with your normal soap after using and rinsing off the CHG Soap.  9. Pat yourself dry with a CLEAN TOWEL.  10. Wear CLEAN PAJAMAS to bed the night before surgery, wear comfortable clothes the morning of surgery  11. Place CLEAN SHEETS on your bed the night of your first shower and DO NOT SLEEP WITH PETS.  Day of Surgery: shower as above Do not apply any deodorants/lotionspowders and colognes Please wear clean clothes to the hospital/surgery center.               Do not wear jewelry, make-up or nail polish.  Do not shave 48 hours prior to surgery.    Do not bring valuables to the hospital.  [facility] is not responsible for any belongings or valuables.  Contacts, dentures or bridgework may not be worn into surgery.  Leave your suitcase in the car.  After surgery it may be brought to your room.  For patients admitted to the hospital, discharge time will be determined by your treatment team.  Patients discharged the day of surgery will not be allowed to drive home.   Please read over the following fact sheets that you were given. Pain Booklet, Coughing and Deep Breathing and Surgical Site Infection Prevention

## 2017-02-07 NOTE — Progress Notes (Signed)
Mrs Robidoux denies chest pain or shortness of breath at rest.  Dr Jenkins Rouge is patient's cardiologist, patient has an appointment tomorrow with a PA at [facility] for surgery clearance. Ms Jackalyn Lombard reports that her glucose levels have been normal in the past and she is not aware of having an A1C , "Dr Sharol Given said this wound is one that is found in diabetics."

## 2017-02-07 NOTE — Progress Notes (Signed)
   02/07/17 0940  OBSTRUCTIVE SLEEP APNEA  Have you ever been diagnosed with sleep apnea through a sleep study? No  Do you snore loudly (loud enough to be heard through closed doors)?  1  Do you often feel tired, fatigued, or sleepy during the daytime (such as falling asleep during driving or talking to someone)? 0  Has anyone observed you stop breathing during your sleep? 0  Do you have, or are you being treated for high blood pressure? 1  BMI more than 35 kg/m2? 1  Age > 50 (1-yes) 1  Neck circumference greater than:Female 16 inches or larger, Female 17inches or larger? 1 (16.5)  Female Gender (Yes=1) 0  Obstructive Sleep Apnea Score 5  Score 5 or greater  Results sent to PCP

## 2017-02-08 ENCOUNTER — Encounter: Payer: Self-pay | Admitting: *Deleted

## 2017-02-08 ENCOUNTER — Encounter: Payer: Self-pay | Admitting: Physician Assistant

## 2017-02-08 ENCOUNTER — Ambulatory Visit: Payer: Medicare Other | Admitting: Physician Assistant

## 2017-02-08 VITALS — BP 133/69 | HR 50 | Ht 66.0 in | Wt 280.0 lb

## 2017-02-08 DIAGNOSIS — I10 Essential (primary) hypertension: Secondary | ICD-10-CM | POA: Diagnosis not present

## 2017-02-08 DIAGNOSIS — Z01818 Encounter for other preprocedural examination: Secondary | ICD-10-CM

## 2017-02-08 DIAGNOSIS — I2510 Atherosclerotic heart disease of native coronary artery without angina pectoris: Secondary | ICD-10-CM

## 2017-02-08 DIAGNOSIS — R001 Bradycardia, unspecified: Secondary | ICD-10-CM | POA: Diagnosis not present

## 2017-02-08 LAB — HEMOGLOBIN A1C
HEMOGLOBIN A1C: 5.6 % (ref 4.8–5.6)
Mean Plasma Glucose: 114 mg/dL

## 2017-02-08 MED ORDER — METOPROLOL TARTRATE 25 MG PO TABS: 12.5000 mg | ORAL_TABLET | Freq: Two times a day (BID) | ORAL | 3 refills | Status: DC

## 2017-02-08 MED ORDER — DEXTROSE 5 % IV SOLN
3.0000 g | INTRAVENOUS | Status: AC
  Administered 2017-02-09: 3 g via INTRAVENOUS
  Filled 2017-02-08: qty 3000

## 2017-02-08 NOTE — Progress Notes (Signed)
Anesthesia Chart Review: Patient is a 61 year old female scheduled for excision talar head, internal fixation medial column left foot on 02/09/17 by Dr. Meridee Score.  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 BMI is consistent with morbid obesity.   - PCP is listed as Dr. [name]. - Cardiologist is Dr. [name]. Patient was seen by Rosaria Ferries, PA-C on 02/08/17 for preoperative evaluation. Metoprolol dose was decreased due to bradycardia. She wrote, "Because of her history of bypass surgery her risk is slightly increased.  However, her RCRI is only 0.9% She is at acceptable risk for the planned procedure without additional testing."  Meds include ASA 81 mg, doxycycline, fenofibrate, Lasix, Nitro, trazodone, Diovan-HCT.  BP (!) 94/57   Pulse (!) 55   Temp 36.8 C   Resp 20   Ht 5\' 6"  (1.676 m)   Wt 278 lb 6.4 oz (126.3 kg)   SpO2 96%   BMI 44.93 kg/m   EKG 02/07/17: SB at 48 bpm, LVH.  Nuclear stress test 01/18/12: IMPRESSION: Equivocal pharmacologic stress nuclear myocardial study revealing mild left ventricular  dilatation, a small segmental wall motion abnormality with a normal ejection fraction, and no significant stress-induced EKG abnormalities.  On scintigraphic imaging, there were questionable small areas of ischemia in the distal inferolateral and basilar inferior segments, perhaps with some superimposed scarring.  Other findings as noted. (Dr. Percival Spanish reviewed and felt that overall scan was low risk. Patient was asymptomatic. He cleared patient for a surgical procedure at that time.)  Echo 04/12/02: FINDINGS/IMPRESSION:  1. A  technically difficult and somewhat limited echocardiographic study.  2. Normal left atrium, right atrium and right ventricle.  3. Normal mitral and tricuspid valves; aortic valve not well imaged, but     probably normal.  4. LV systolic function assessed with the assistance of intravenous     echocardiographic contrast.  5. Left ventricular size was normal.  There was borderline LVH.  Not all     myocardial segments adequately imaged--no areas of akinesis nor     dyskinesis.  Overall LV systolic function appeared normal.  Last LHC seen was prior to 2003 CABG.  Preoperative labs noted. BUN 37, Cr 1.39. WBC 11.6, H/H 13.8/42.2. A1c 5.6.   If no acute changes then I anticipate that she can proceed as planned.  George Hugh [facility] Short Stay Center/Anesthesiology Phone [phone] 02/08/2017 4:36 PM

## 2017-02-08 NOTE — Progress Notes (Signed)
Cardiology Office Note   Date:  02/08/2017   ID:  Julia West, DOB 12-09-1955, MRN [id number]  PCP:  [name], MD  Cardiologist: Dr. Percival Spanish, 12/31/2015 Rosaria Ferries, PA-C   Chief Complaint  Patient presents with  . Follow-up    cardiac clearance    History of Present Illness: Julia West is a 61 y.o. 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 She needs surgery to keep from losing her L foot.  She does not exercise, she has been restricted in activity because of her foot for the last week or so. Before that, she was carrying her 20 lb grandson around regularly. She takes care of him 3 days per week. She vacuums 3 x week because they have dogs, and does other housework.  She denies CP or SOB w/ exertion. No LE edema, no orthopnea or PND. She does not feel her activity is limited by SOB or CP at all. She has lost some weight, was 305 lbs 12/2015.   Her HR is low, her PCP cut the metoprolol 50 mg twice daily to 1/2 tab bid, but her HR is still low. She denies presyncope or syncope.    Past Medical History:  Diagnosis Date  . Acute myocardial infarction    with a ruptured plaque in the circumflex in 2003  . Anxiety   . 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 Complication of anesthesia    states low O2 sats post-op 11/13, always slow to awaken  . Degenerative joint disease   . Dyspnea    with activity- car to house - steps  . Heart murmur    "not to be concerned"  . HTN (hypertension)   . Hyperlipidemia LDL goal <70   . Internal and external hemorrhoids without complication   . Lymphedema    right side of body  . Lymphedema of arm    right  . Morbid obesity ([place])   . Surgical wound, non  healing ABDOMINAL    has wound vac @ 125 mm Hg    Past Surgical History:  Procedure Laterality Date  . APPLICATION OF WOUND VAC    . BOWEL RESECTION  02/07/2012   Procedure: SMALL BOWEL RESECTION;  Surgeon: Gayland Curry, MD,FACS;  Location: [place];  Service: General;;  . CARDIOVASCULAR STRESS TEST  01-17-2012  DR HOCHREIN   LOW RISK NUCLEAR TEST  . CESAREAN SECTION     x 4 in remote past  . CHOLECYSTECTOMY    . CORONARY ARTERY BYPASS GRAFT  2003   by Dr. Servando Snare. LIMA to the LAD, free RIMA to the circumflex. Stress perfusion study December 2009 with no high-risk areas of ischemia. She has a well-preserved ejection fraction  . HYSTEROSCOPY W/D&C N/A 05/14/2014   Procedure: DILATATION AND CURETTAGE (no specimen); HYSTEROSCOPY;  Surgeon: Jonnie Kind, MD;  Location: [facility];  Service: Gynecology;  Laterality: N/A;  . INCISION AND DRAINAGE OF WOUND  04/17/2012   Procedure: IRRIGATION AND DEBRIDEMENT WOUND;  Surgeon: Theodoro Kos, DO;  Location: [place];  Service: Plastics;  Laterality: N/A;  OF ABDOMINAL WOUND, SURGICAL PREP AND PLACEMENT OF VAC, REMOVAL FOREHEAD SKIN LESION  . INCISION  AND DRAINAGE OF WOUND N/A 04/24/2012   Procedure: IRRIGATION AND DEBRIDEMENT WOUND;  Surgeon: Theodoro Kos, DO;  Location: [place];  Service: Plastics;  Laterality: N/A;  I & D ABDOMINAL WOUND WITH VAC AND ACELL  . INCISION AND DRAINAGE OF WOUND N/A 05/01/2012   Procedure: IRRIGATION AND DEBRIDEMENT WOUND;  Surgeon: Theodoro Kos, DO;  Location: [place];  Service: Plastics;  Laterality: N/A;  WITH SURGICAL PREP AND PLACEMENT OF VAC  . INCISION AND DRAINAGE OF WOUND N/A 05/08/2012   Procedure: IRRIGATION AND DEBRIDEMENT OF ABD WOUND SURGICAL PREP AND PLACEMENT OF VAC ;  Surgeon: Theodoro Kos, DO;  Location: [place];  Service: Plastics;  Laterality: N/A;  IRRIGATION AND DEBRIDEMENT OF ABD WOUND SURGICAL PREP AND PLACEMENT OF VAC   .  INCISION AND DRAINAGE OF WOUND N/A 05/15/2012   Procedure: IRRIGATION AND DEBRIDEMENT OF ABDOMINAL WOUND WITH POSSIBLE SURGICAL PREP AND PLACEMENT OF VAC;  Surgeon: Theodoro Kos, DO;  Location: [place];  Service: Plastics;  Laterality: N/A;  . INCISION AND DRAINAGE OF WOUND N/A 05/22/2012   Procedure: IRRIGATION AND DEBRIDEMENT OF ABODOMINAL WOUND WITH  SURGICAL PREP AND VAC PLACEMENT;  Surgeon: Theodoro Kos, DO;  Location: [place];  Service: Plastics;  Laterality: N/A;  . INCISION AND DRAINAGE OF WOUND N/A 06/28/2012   Procedure: IRRIGATION AND DEBRIDEMENT OF ABDOMINAL ULCER SURGICAL PREP AND PLACEMENT OF ACELL AND VAC;  Surgeon: Theodoro Kos, DO;  Location: [place];  Service: Plastics;  Laterality: N/A;  . INCISIONAL HERNIA REPAIR  02/07/2012   Procedure: HERNIA REPAIR INCISIONAL;  Surgeon: Gayland Curry, MD,FACS;  Location: [place];  Service: General;;  Open, Primary repair, strangulated Incisional hernia.  Marland Kitchen LESION REMOVAL  04/17/2012   Procedure: LESION REMOVAL;  Surgeon: Theodoro Kos, DO;  Location: [place];  Service: Plastics;  Laterality: N/A;   CENTER OF FOREHEAD, REMOVAL FORHEAD SKIN LESION  . POLYPECTOMY N/A 05/14/2014   Procedure: ENDOMETRIAL POLYPECTOMY;  Surgeon: Jonnie Kind, MD;  Location: [facility];  Service: Gynecology;  Laterality: N/A;    Medication Sig  . aspirin 81 MG tablet Take 81 mg by mouth daily.   . diclofenac (VOLTAREN) 75 MG EC tablet Take 1 tablet (75 mg total) by mouth 2 (two) times daily.  Marland Kitchen doxycycline (VIBRA-TABS) 100 MG tablet Take 1 tablet (100 mg total) 2 (two) times daily by mouth.  . fenofibrate 160 MG tablet TAKE 1 TABLET BY MOUTH  DAILY FOR CHOLESTEROL AND  TRIGLYCERIDES (Patient taking differently: Take 160 mg by mouth daily. TAKE 1 TABLET BY MOUTH  DAILY FOR CHOLESTEROL AND  TRIGLYCERIDES)  . furosemide (LASIX) 20 MG tablet TAKE 1 TABLET BY MOUTH  DAILY  . ibuprofen (ADVIL,MOTRIN) 200  MG tablet Take 400 mg by mouth every 8 (eight) hours as needed for mild pain.  . meloxicam (MOBIC) 15 MG tablet Take 1 tablet (15 mg total) by mouth daily. For joint and muscle pain  . metoprolol tartrate (LOPRESSOR) 50 MG tablet Take 0.5 tablets (25 mg total) by mouth 2 (two) times daily.  . Multiple Vitamin (MULTIVITAMIN) tablet Take 1 tablet by mouth daily.  . nitroGLYCERIN (NITROSTAT) 0.4 MG SL tablet Place 1 tablet (0.4 mg total) under the tongue every 5 (five) minutes x 3 doses as needed for chest pain. If pain persist after call 911  . traZODone (DESYREL) 150 MG tablet TAKE 1 OR 2 TABLETS BY  MOUTH AT BEDTIME AS  NEEDED  FOR SLEEP  . valsartan-hydrochlorothiazide (DIOVAN-HCT) 160-12.5 MG tablet Take 1 tablet by mouth daily.  Marland Kitchen amoxicillin-clavulanate (AUGMENTIN) 875-125 MG tablet Take 1 tablet 2 (two) times daily by mouth. (Patient not taking: Reported on 02/08/2017)  . doxycycline (VIBRA-TABS) 100 MG tablet Take 1 tablet (100 mg total) 2 (two) times daily by mouth. (Patient not taking: Reported on 02/08/2017)   No current facility-administered medications for this visit.    Facility-Administered Medications Ordered in Other Visits  Medication Dose Route Frequency Provider Last Rate Last Dose  . [START ON 02/09/2017] ceFAZolin (ANCEF) 3 g in dextrose 5 % 50 mL IVPB  3 g Intravenous On Call to OR Newt Minion, MD        Allergies:   Ace inhibitors    Social History:  The patient  reports that she quit smoking about 5 years ago. Her smoking use included cigarettes. She has a 25.00 pack-year smoking history. she has never used smokeless tobacco. She reports that she does not drink alcohol or use drugs.   Family History:  The patient's family history includes Birth defects in her son; Diabetes in her brother; Heart attack in her mother; Hypertension in her sister.    ROS:  Please see the history of present illness. All other systems are reviewed and negative.    PHYSICAL EXAM: VS:   BP 133/69   Pulse (!) 50   Ht 5\' 6"  (1.676 m)   Wt 280 lb (127 kg)   BMI 45.19 kg/m  , BMI Body mass index is 45.19 kg/m. GEN: Well nourished, well developed, female in no acute distress  HEENT: normal for age  Neck: no JVD seen but difficult to assess secondary to body habitus, no carotid bruit, no masses Cardiac: RRR; soft murmur, no rubs, or gallops Respiratory:  clear to auscultation bilaterally, normal work of breathing GI: soft, nontender, nondistended, + BS MS: no deformity or atrophy; no edema; distal pulses are 2+ in all 4 extremities, left foot is bandaged and not disturbed Skin: warm and dry, no rash Neuro:  Strength and sensation are intact Psych: euthymic mood, full affect   EKG:  EKG is not ordered today. The ekg ordered yesterday demonstrates sinus brady, HR 48, +LVH, no acute ischemic changes No significant change from 12/2015 ECG  MYOVIEW: 01/17/2012 Scintigraphic Data: Acquisition notable for moderate breast attenuation.  The left ventricle was mildly dilated.  On tomographic images reconstructed in standard planes, there was generally patchy uptake of tracer in the myocardium.   Very small defects were seen in the distal inferolateral segment, the basilar inferior wall and the mid and basilar anteroseptal region.  The latter defect was numerically insignificant.  Moderate reversibility was seen inferolaterally and partial reversibility at the base of the inferior wall.  The gated reconstruction demonstrated moderate inferoseptal hypokinesis with preserved overall LV systolic function.  Estimated ejection was 63%.  There was essentially normal systolic accentuation of activity throughout except perhaps for a slight decrease at the base of the inferior wall. IMPRESSION: Equivocal pharmacologic stress nuclear myocardial study revealing mild left ventricular  dilatation, a small segmental wall motion abnormality with a normal ejection fraction, and no  significant stress-induced EKG abnormalities.  On scintigraphic imaging, there were questionable small areas of ischemia in the distal inferolateral and basilar inferior segments, perhaps with some superimposed scarring.  Other findings as noted.   Recent Labs: 12/10/2016: ALT 7 02/07/2017: BUN 37; Creatinine, Ser 1.39; Hemoglobin 13.8; Platelets 308; Potassium 3.8; Sodium 138  Lipid Panel    Component Value Date/Time   CHOL 172 12/10/2016 1203   TRIG 246 (H) 12/10/2016 1203   TRIG 247 (H) 09/12/2013 1137   HDL 36 (L) 12/10/2016 1203   HDL 35 (L) 09/12/2013 1137   CHOLHDL 4.8 (H) 12/10/2016 1203   LDLCALC 87 12/10/2016 1203   LDLCALC 115 (H) 09/12/2013 1137     Wt Readings from Last 3 Encounters:  02/08/17 280 lb (127 kg)  02/07/17 278 lb 6.4 oz (126.3 kg)  01/31/17 280 lb (127 kg)     Other studies Reviewed: Additional studies/ records that were reviewed today include: Office notes, hospital records and testing.  ASSESSMENT AND PLAN:  1.  Preop evaluation: She had bypass surgery in 2002, last stress test was low risk in 2013.  She is having no ischemic symptoms.  Prior to her acute problem, her activity level was good.  Although she does not exercise, she was able to carry her grandson around and do other things around the house without getting chest pain or shortness of breath.  No signs or symptoms of CHF, angina, or arrhythmia.  Because of her history of bypass surgery her risk is slightly increased.  However, her RCRI is only 0.9% She is at acceptable risk for the planned procedure without additional testing.  2.  CAD: She is encouraged to take her baby aspirin and metoprolol the day of the surgery.  She has been intolerant of statins in the past.  She is encouraged to maintain a low-sodium, low-cholesterol diet  3.  Sinus bradycardia: She is asymptomatic with a heart rate in the 40s.  However, I worried that the anesthesia will cause her heart rate to slow further.   I will therefore decrease her metoprolol to 25 mg twice daily down to 12.5 mg twice daily.  4.  Hypertension: 133/69; she is on both Lasix 20 mg a day and Diovan/HCTZ.  She is not having any signs and symptoms of volume overload. Her blood pressure is just above target today.  Continue current medications and reassess on the lower dose of metoprolol.   Current medicines are reviewed at length with the patient today.  The patient does not have concerns regarding medicines.  The following changes have been made: Decrease metoprolol  Labs/ tests ordered today include:  No orders of the defined types were placed in this encounter.    Disposition:   FU with Dr. Percival Spanish  Signed, Rosaria Ferries, PA-C  02/08/2017 4:08 PM    [place] Phone: [phone]; Fax: [phone]  This note was written with the assistance of speech recognition software. Please excuse any transcriptional errors.

## 2017-02-08 NOTE — Patient Instructions (Addendum)
Your physician has recommended you make the following change in your medication: DECREASE METOPROLOL TO 12.5 MG  TWICE DAILY   Your physician recommends that you schedule a follow-up appointment in:  [place]

## 2017-02-09 ENCOUNTER — Inpatient Hospital Stay (HOSPITAL_COMMUNITY): Payer: Medicare Other | Admitting: Vascular Surgery

## 2017-02-09 ENCOUNTER — Other Ambulatory Visit: Payer: Self-pay

## 2017-02-09 ENCOUNTER — Encounter (HOSPITAL_COMMUNITY): Payer: Self-pay | Admitting: Certified Registered"

## 2017-02-09 ENCOUNTER — Inpatient Hospital Stay (HOSPITAL_COMMUNITY)
Admission: RE | Admit: 2017-02-09 | Discharge: 2017-02-14 | DRG: 982 | Disposition: A | Discharge status: Home / Self Care | Payer: Medicare Other | Source: Ambulatory Visit | Attending: Orthopedic Surgery | Admitting: Orthopedic Surgery

## 2017-02-09 ENCOUNTER — Other Ambulatory Visit: Payer: Self-pay | Admitting: Family Medicine

## 2017-02-09 ENCOUNTER — Inpatient Hospital Stay (HOSPITAL_COMMUNITY): Payer: Medicare Other | Admitting: Anesthesiology

## 2017-02-09 ENCOUNTER — Encounter (HOSPITAL_COMMUNITY)
Admission: RE | Disposition: A | Discharge status: Home / Self Care | Payer: Self-pay | Source: Ambulatory Visit | Attending: Orthopedic Surgery

## 2017-02-09 DIAGNOSIS — I119 Hypertensive heart disease without heart failure: Secondary | ICD-10-CM | POA: Diagnosis not present

## 2017-02-09 DIAGNOSIS — I890 Lymphedema, not elsewhere classified: Secondary | ICD-10-CM | POA: Diagnosis present

## 2017-02-09 DIAGNOSIS — G8911 Acute pain due to trauma: Secondary | ICD-10-CM | POA: Diagnosis not present

## 2017-02-09 DIAGNOSIS — Z8249 Family history of ischemic heart disease and other diseases of the circulatory system: Secondary | ICD-10-CM | POA: Diagnosis not present

## 2017-02-09 DIAGNOSIS — M86172 Other acute osteomyelitis, left ankle and foot: Secondary | ICD-10-CM | POA: Diagnosis not present

## 2017-02-09 DIAGNOSIS — I10 Essential (primary) hypertension: Secondary | ICD-10-CM | POA: Diagnosis present

## 2017-02-09 DIAGNOSIS — E785 Hyperlipidemia, unspecified: Secondary | ICD-10-CM | POA: Diagnosis not present

## 2017-02-09 DIAGNOSIS — M86272 Subacute osteomyelitis, left ankle and foot: Secondary | ICD-10-CM | POA: Diagnosis not present

## 2017-02-09 DIAGNOSIS — M14672 Charcot's joint, left ankle and foot: Secondary | ICD-10-CM

## 2017-02-09 DIAGNOSIS — I252 Old myocardial infarction: Secondary | ICD-10-CM

## 2017-02-09 DIAGNOSIS — I2510 Atherosclerotic heart disease of native coronary artery without angina pectoris: Secondary | ICD-10-CM | POA: Diagnosis not present

## 2017-02-09 DIAGNOSIS — E1169 Type 2 diabetes mellitus with other specified complication: Secondary | ICD-10-CM | POA: Diagnosis not present

## 2017-02-09 DIAGNOSIS — Z951 Presence of aortocoronary bypass graft: Secondary | ICD-10-CM | POA: Diagnosis not present

## 2017-02-09 DIAGNOSIS — Z4789 Encounter for other orthopedic aftercare: Secondary | ICD-10-CM | POA: Diagnosis not present

## 2017-02-09 DIAGNOSIS — Z888 Allergy status to other drugs, medicaments and biological substances status: Secondary | ICD-10-CM | POA: Diagnosis not present

## 2017-02-09 DIAGNOSIS — S92909A Unspecified fracture of unspecified foot, initial encounter for closed fracture: Secondary | ICD-10-CM | POA: Diagnosis not present

## 2017-02-09 DIAGNOSIS — M869 Osteomyelitis, unspecified: Secondary | ICD-10-CM | POA: Diagnosis not present

## 2017-02-09 DIAGNOSIS — E11610 Type 2 diabetes mellitus with diabetic neuropathic arthropathy: Secondary | ICD-10-CM | POA: Diagnosis not present

## 2017-02-09 DIAGNOSIS — Z87891 Personal history of nicotine dependence: Secondary | ICD-10-CM

## 2017-02-09 DIAGNOSIS — G8918 Other acute postprocedural pain: Secondary | ICD-10-CM | POA: Diagnosis not present

## 2017-02-09 DIAGNOSIS — R2681 Unsteadiness on feet: Secondary | ICD-10-CM | POA: Diagnosis not present

## 2017-02-09 DIAGNOSIS — L89621 Pressure ulcer of left heel, stage 1: Secondary | ICD-10-CM | POA: Diagnosis not present

## 2017-02-09 HISTORY — PX: ORIF TOE FRACTURE: SHX5032

## 2017-02-09 LAB — GLUCOSE, CAPILLARY
GLUCOSE-CAPILLARY: 97 mg/dL (ref 65–99)
GLUCOSE-CAPILLARY: 98 mg/dL (ref 65–99)

## 2017-02-09 SURGERY — OPEN REDUCTION INTERNAL FIXATION (ORIF) METATARSAL (TOE) FRACTURE
Anesthesia: General | Laterality: Left

## 2017-02-09 MED ORDER — POLYETHYLENE GLYCOL 3350 17 G PO PACK
17.0000 g | PACK | Freq: Every day | ORAL | Status: DC | PRN
  Administered 2017-02-13: 17 g via ORAL
  Filled 2017-02-09: qty 1

## 2017-02-09 MED ORDER — MIDAZOLAM HCL 2 MG/2ML IJ SOLN
INTRAMUSCULAR | Status: AC
  Filled 2017-02-09: qty 2

## 2017-02-09 MED ORDER — METOCLOPRAMIDE HCL 5 MG/ML IJ SOLN: 5.0000 mg | Freq: Three times a day (TID) | INTRAMUSCULAR | Status: DC | PRN

## 2017-02-09 MED ORDER — FENTANYL CITRATE (PF) 100 MCG/2ML IJ SOLN
INTRAMUSCULAR | Status: AC
  Administered 2017-02-09: 50 ug via INTRAVENOUS
  Filled 2017-02-09: qty 2

## 2017-02-09 MED ORDER — CHLORHEXIDINE GLUCONATE 4 % EX LIQD: 60.0000 mL | Freq: Once | CUTANEOUS | Status: DC

## 2017-02-09 MED ORDER — FENTANYL CITRATE (PF) 100 MCG/2ML IJ SOLN
INTRAMUSCULAR | Status: AC
  Administered 2017-02-09: 50 ug
  Filled 2017-02-09: qty 2

## 2017-02-09 MED ORDER — HYDROMORPHONE HCL 1 MG/ML IJ SOLN
1.0000 mg | INTRAMUSCULAR | Status: DC | PRN
  Administered 2017-02-09 – 2017-02-11 (×4): 1 mg via INTRAVENOUS
  Filled 2017-02-09 (×5): qty 1

## 2017-02-09 MED ORDER — BUPIVACAINE-EPINEPHRINE (PF) 0.5% -1:200000 IJ SOLN
INTRAMUSCULAR | Status: DC | PRN
  Administered 2017-02-09: 30 mL via PERINEURAL

## 2017-02-09 MED ORDER — ASPIRIN EC 325 MG PO TBEC
325.0000 mg | DELAYED_RELEASE_TABLET | Freq: Every day | ORAL | Status: DC
  Administered 2017-02-09 – 2017-02-14 (×6): 325 mg via ORAL
  Filled 2017-02-09 (×6): qty 1

## 2017-02-09 MED ORDER — FENTANYL CITRATE (PF) 100 MCG/2ML IJ SOLN
INTRAMUSCULAR | Status: AC
  Filled 2017-02-09: qty 2

## 2017-02-09 MED ORDER — BISACODYL 10 MG RE SUPP: 10.0000 mg | Freq: Every day | RECTAL | Status: DC | PRN

## 2017-02-09 MED ORDER — FENTANYL CITRATE (PF) 100 MCG/2ML IJ SOLN
INTRAMUSCULAR | Status: DC | PRN
  Administered 2017-02-09 (×3): 50 ug via INTRAVENOUS
  Administered 2017-02-09 (×2): 25 ug via INTRAVENOUS

## 2017-02-09 MED ORDER — LACTATED RINGERS IV SOLN
INTRAVENOUS | Status: DC
  Administered 2017-02-09: 50 mL/h via INTRAVENOUS
  Administered 2017-02-09: 13:00:00 via INTRAVENOUS

## 2017-02-09 MED ORDER — IRBESARTAN 150 MG PO TABS
150.0000 mg | ORAL_TABLET | Freq: Every day | ORAL | Status: DC
  Administered 2017-02-13: 150 mg via ORAL
  Filled 2017-02-09 (×5): qty 1

## 2017-02-09 MED ORDER — FUROSEMIDE 20 MG PO TABS
20.0000 mg | ORAL_TABLET | Freq: Every day | ORAL | Status: DC
  Administered 2017-02-10 – 2017-02-14 (×5): 20 mg via ORAL
  Filled 2017-02-09 (×5): qty 1

## 2017-02-09 MED ORDER — PROPOFOL 10 MG/ML IV BOLUS
INTRAVENOUS | Status: DC | PRN
  Administered 2017-02-09: 200 mg via INTRAVENOUS
  Administered 2017-02-09: 40 mg via INTRAVENOUS

## 2017-02-09 MED ORDER — HYDROCHLOROTHIAZIDE 12.5 MG PO CAPS
12.5000 mg | ORAL_CAPSULE | Freq: Every day | ORAL | Status: DC
  Administered 2017-02-13 – 2017-02-14 (×2): 12.5 mg via ORAL
  Filled 2017-02-09 (×5): qty 1

## 2017-02-09 MED ORDER — METOCLOPRAMIDE HCL 5 MG PO TABS: 5.0000 mg | ORAL_TABLET | Freq: Three times a day (TID) | ORAL | Status: DC | PRN

## 2017-02-09 MED ORDER — METHOCARBAMOL 500 MG PO TABS
ORAL_TABLET | ORAL | Status: AC
  Administered 2017-02-09: 500 mg via ORAL
  Filled 2017-02-09: qty 1

## 2017-02-09 MED ORDER — 0.9 % SODIUM CHLORIDE (POUR BTL) OPTIME
TOPICAL | Status: DC | PRN
  Administered 2017-02-09: 1000 mL

## 2017-02-09 MED ORDER — OXYCODONE HCL 5 MG PO TABS
ORAL_TABLET | ORAL | Status: AC
  Administered 2017-02-09: 10 mg via ORAL
  Filled 2017-02-09: qty 2

## 2017-02-09 MED ORDER — FENTANYL CITRATE (PF) 100 MCG/2ML IJ SOLN
25.0000 ug | INTRAMUSCULAR | Status: DC | PRN
  Administered 2017-02-09 (×3): 50 ug via INTRAVENOUS

## 2017-02-09 MED ORDER — METOPROLOL TARTRATE 12.5 MG HALF TABLET
12.5000 mg | ORAL_TABLET | Freq: Two times a day (BID) | ORAL | Status: DC
  Administered 2017-02-09 – 2017-02-13 (×5): 12.5 mg via ORAL
  Filled 2017-02-09 (×9): qty 1

## 2017-02-09 MED ORDER — ACETAMINOPHEN 650 MG RE SUPP: 650.0000 mg | RECTAL | Status: DC | PRN

## 2017-02-09 MED ORDER — DEXTROSE 5 % IV SOLN: 500.0000 mg | Freq: Four times a day (QID) | INTRAVENOUS | Status: DC | PRN

## 2017-02-09 MED ORDER — FENTANYL CITRATE (PF) 250 MCG/5ML IJ SOLN
INTRAMUSCULAR | Status: AC
  Filled 2017-02-09: qty 5

## 2017-02-09 MED ORDER — DEXAMETHASONE SODIUM PHOSPHATE 10 MG/ML IJ SOLN
INTRAMUSCULAR | Status: DC | PRN
  Administered 2017-02-09: 10 mg via INTRAVENOUS

## 2017-02-09 MED ORDER — CEFAZOLIN SODIUM-DEXTROSE 2-4 GM/100ML-% IV SOLN
2.0000 g | Freq: Four times a day (QID) | INTRAVENOUS | Status: AC
  Administered 2017-02-09 – 2017-02-10 (×3): 2 g via INTRAVENOUS
  Filled 2017-02-09 (×3): qty 100

## 2017-02-09 MED ORDER — MIDAZOLAM HCL 2 MG/2ML IJ SOLN
INTRAMUSCULAR | Status: AC
  Administered 2017-02-09: 1 mg
  Filled 2017-02-09: qty 2

## 2017-02-09 MED ORDER — SODIUM CHLORIDE 0.9 % IV SOLN
INTRAVENOUS | Status: DC
  Administered 2017-02-09: 18:00:00 via INTRAVENOUS

## 2017-02-09 MED ORDER — OXYCODONE HCL 5 MG PO TABS
10.0000 mg | ORAL_TABLET | ORAL | Status: DC | PRN
  Administered 2017-02-09 – 2017-02-14 (×17): 10 mg via ORAL
  Filled 2017-02-09 (×17): qty 2

## 2017-02-09 MED ORDER — ONDANSETRON HCL 4 MG/2ML IJ SOLN
INTRAMUSCULAR | Status: DC | PRN
  Administered 2017-02-09: 4 mg via INTRAVENOUS

## 2017-02-09 MED ORDER — EPHEDRINE SULFATE 50 MG/ML IJ SOLN
INTRAMUSCULAR | Status: DC | PRN
  Administered 2017-02-09: 5 mg via INTRAVENOUS
  Administered 2017-02-09: 10 mg via INTRAVENOUS

## 2017-02-09 MED ORDER — METHOCARBAMOL 500 MG PO TABS
500.0000 mg | ORAL_TABLET | Freq: Four times a day (QID) | ORAL | Status: DC | PRN
  Administered 2017-02-09 – 2017-02-14 (×10): 500 mg via ORAL
  Filled 2017-02-09 (×9): qty 1

## 2017-02-09 MED ORDER — DOCUSATE SODIUM 100 MG PO CAPS
100.0000 mg | ORAL_CAPSULE | Freq: Two times a day (BID) | ORAL | Status: DC
  Administered 2017-02-09 – 2017-02-14 (×10): 100 mg via ORAL
  Filled 2017-02-09 (×10): qty 1

## 2017-02-09 MED ORDER — HYDROCODONE-ACETAMINOPHEN 5-325 MG PO TABS
1.0000 | ORAL_TABLET | ORAL | Status: DC | PRN
  Administered 2017-02-09 – 2017-02-13 (×11): 1 via ORAL
  Filled 2017-02-09 (×11): qty 1

## 2017-02-09 MED ORDER — MAGNESIUM CITRATE PO SOLN: 1.0000 | Freq: Once | ORAL | Status: DC | PRN

## 2017-02-09 MED ORDER — TRAZODONE HCL 100 MG PO TABS
150.0000 mg | ORAL_TABLET | Freq: Every evening | ORAL | Status: DC | PRN
  Administered 2017-02-09 – 2017-02-14 (×4): 150 mg via ORAL
  Filled 2017-02-09 (×4): qty 1

## 2017-02-09 MED ORDER — PROPOFOL 10 MG/ML IV BOLUS
INTRAVENOUS | Status: AC
  Filled 2017-02-09: qty 40

## 2017-02-09 MED ORDER — ONDANSETRON HCL 4 MG PO TABS: 4.0000 mg | ORAL_TABLET | Freq: Four times a day (QID) | ORAL | Status: DC | PRN

## 2017-02-09 MED ORDER — ACETAMINOPHEN 325 MG PO TABS
650.0000 mg | ORAL_TABLET | ORAL | Status: DC | PRN
  Administered 2017-02-13: 650 mg via ORAL
  Filled 2017-02-09: qty 2

## 2017-02-09 MED ORDER — GLYCOPYRROLATE 0.2 MG/ML IJ SOLN
INTRAMUSCULAR | Status: DC | PRN
  Administered 2017-02-09: 0.2 mg via INTRAVENOUS

## 2017-02-09 MED ORDER — VALSARTAN-HYDROCHLOROTHIAZIDE 160-12.5 MG PO TABS: 1.0000 | ORAL_TABLET | Freq: Every day | ORAL | Status: DC

## 2017-02-09 MED ORDER — ONDANSETRON HCL 4 MG/2ML IJ SOLN: 4.0000 mg | Freq: Once | INTRAMUSCULAR | Status: DC | PRN

## 2017-02-09 MED ORDER — ONDANSETRON HCL 4 MG/2ML IJ SOLN
4.0000 mg | Freq: Four times a day (QID) | INTRAMUSCULAR | Status: DC | PRN
  Administered 2017-02-10: 4 mg via INTRAVENOUS
  Filled 2017-02-09: qty 2

## 2017-02-09 MED ORDER — LIDOCAINE 2% (20 MG/ML) 5 ML SYRINGE
INTRAMUSCULAR | Status: DC | PRN
  Administered 2017-02-09: 60 mg via INTRAVENOUS

## 2017-02-09 SURGICAL SUPPLY — 57 items
BIT DRILL 2.5 X LONG (BIT) ×1
BIT DRILL PERC QC 2.8X200 100 (BIT) ×1 IMPLANT
BIT DRILL X LONG 2.5 (BIT) ×1 IMPLANT
BLADE AVERAGE 25X9 (BLADE) ×4 IMPLANT
BLADE MINI RND TIP GREEN BEAV (BLADE) IMPLANT
BNDG COHESIVE 1X5 TAN STRL LF (GAUZE/BANDAGES/DRESSINGS) IMPLANT
BNDG COHESIVE 6X5 TAN STRL LF (GAUZE/BANDAGES/DRESSINGS) ×2 IMPLANT
BNDG ESMARK 4X9 LF (GAUZE/BANDAGES/DRESSINGS) ×2 IMPLANT
BNDG GAUZE ELAST 4 BULKY (GAUZE/BANDAGES/DRESSINGS) ×2 IMPLANT
BNDG GAUZE STRTCH 6 (GAUZE/BANDAGES/DRESSINGS) IMPLANT
COTTON STERILE ROLL (GAUZE/BANDAGES/DRESSINGS) IMPLANT
COVER SURGICAL LIGHT HANDLE (MISCELLANEOUS) ×2 IMPLANT
CUFF TOURNIQUET SINGLE 18IN (TOURNIQUET CUFF) IMPLANT
CUFF TOURNIQUET SINGLE 24IN (TOURNIQUET CUFF) IMPLANT
CUFF TOURNIQUET SINGLE 34IN LL (TOURNIQUET CUFF) IMPLANT
CUFF TOURNIQUET SINGLE 44IN (TOURNIQUET CUFF) IMPLANT
DRAPE OEC MINIVIEW 54X84 (DRAPES) ×2 IMPLANT
DRAPE U-SHAPE 47X51 STRL (DRAPES) ×2 IMPLANT
DRILL BIT QUICK COUP 2.8MM 100 (BIT) ×1
DRILL BIT X LONG 2.5 (BIT) ×1
DRSG ADAPTIC 3X8 NADH LF (GAUZE/BANDAGES/DRESSINGS) ×2 IMPLANT
DURAPREP 26ML APPLICATOR (WOUND CARE) ×2 IMPLANT
ELECT REM PT RETURN 9FT ADLT (ELECTROSURGICAL) ×2
ELECTRODE REM PT RTRN 9FT ADLT (ELECTROSURGICAL) ×1 IMPLANT
GAUZE SPONGE 2X2 8PLY STRL LF (GAUZE/BANDAGES/DRESSINGS) IMPLANT
GAUZE SPONGE 4X4 12PLY STRL (GAUZE/BANDAGES/DRESSINGS) ×2 IMPLANT
GLOVE BIOGEL PI IND STRL 9 (GLOVE) ×1 IMPLANT
GLOVE BIOGEL PI INDICATOR 9 (GLOVE) ×1
GLOVE SURG ORTHO 9.0 STRL STRW (GLOVE) ×2 IMPLANT
GOWN STRL REUS W/ TWL XL LVL3 (GOWN DISPOSABLE) ×2 IMPLANT
GOWN STRL REUS W/TWL XL LVL3 (GOWN DISPOSABLE) ×2
KIT BASIN OR (CUSTOM PROCEDURE TRAY) ×2 IMPLANT
KIT ROOM TURNOVER OR (KITS) ×2 IMPLANT
MANIFOLD NEPTUNE II (INSTRUMENTS) ×2 IMPLANT
NEEDLE HYPO 25GX1X1/2 BEV (NEEDLE) IMPLANT
NS IRRIG 1000ML POUR BTL (IV SOLUTION) ×2 IMPLANT
PACK ORTHO EXTREMITY (CUSTOM PROCEDURE TRAY) ×2 IMPLANT
PAD ARMBOARD 7.5X6 YLW CONV (MISCELLANEOUS) ×4 IMPLANT
PAD CAST 4YDX4 CTTN HI CHSV (CAST SUPPLIES) IMPLANT
PADDING CAST COTTON 4X4 STRL (CAST SUPPLIES)
PLATE LOCK VA 3.5X78 LT (Plate) ×2 IMPLANT
SCREW 3.5X22MM (Screw) ×2 IMPLANT
SCREW CORTEX PROFILE 3.5X40 (Screw) ×6 IMPLANT
SCREW HEAD ST STARDR 3.5X38 (Screw) ×2 IMPLANT
SCREW LOCK ST STARDR 3.5X42 (Screw) ×2 IMPLANT
SCREW LOCK ST STARDR 3.5X46 (Screw) ×4 IMPLANT
SCREW LOCKING VA 3.5X30MM (Screw) ×2 IMPLANT
SPECIMEN JAR SMALL (MISCELLANEOUS) IMPLANT
SPONGE GAUZE 2X2 STER 10/PKG (GAUZE/BANDAGES/DRESSINGS)
SUCTION FRAZIER HANDLE 10FR (MISCELLANEOUS) ×1
SUCTION TUBE FRAZIER 10FR DISP (MISCELLANEOUS) ×1 IMPLANT
SUT ETHILON 2 0 PSLX (SUTURE) ×4 IMPLANT
SUT VIC AB 2-0 CT1 27 (SUTURE) ×1
SUT VIC AB 2-0 CT1 TAPERPNT 27 (SUTURE) ×1 IMPLANT
SYR CONTROL 10ML LL (SYRINGE) IMPLANT
TUBE CONNECTING 12X1/4 (SUCTIONS) ×2 IMPLANT
WATER STERILE IRR 1000ML POUR (IV SOLUTION) IMPLANT

## 2017-02-09 NOTE — H&P (Signed)
Julia West is an 61 y.o. female.   Chief Complaint: left foot open wound HPI: Patient is a 61 year old woman who presents with Charcot collapse of the left foot.  She went to the emergency room  radiographs were obtained of her ankle she was told there was no evidence of osteomyelitis of the ankle.  She advised to perform a wet-to-dry dressing change she was given a prescription for Augmentin she has not filled.  Patient states she has a history of lymphedema she states she is not sure whether she is diabetic.  She states that she has seen 3 different orthopedic doctors at [place] and was referred to [facility] for an ankle foot orthosis.  Patient complains of redness and drainage from the ulcer over the talar head.     Past Medical History:  Diagnosis Date  . Acute myocardial infarction    with a ruptured plaque in the circumflex in 2003  . Anxiety   . Cancer ([place])    basal cell face  . Complication of anesthesia    states low O2 sats post-op 11/13, always slow to awaken  . Degenerative joint disease   . Dyspnea    with activity- car to house - steps  . Heart murmur    "not to be concerned"  . HTN (hypertension)   . Hyperlipidemia LDL goal <70   . Internal and external hemorrhoids without complication   . Lymphedema    right side of body  . Lymphedema of arm    right  . Morbid obesity ([place])   . Surgical wound, non healing ABDOMINAL    has wound vac @ 125 mm Hg    Past Surgical History:  Procedure Laterality Date  . APPLICATION OF WOUND VAC    . BOWEL RESECTION  02/07/2012   Procedure: SMALL BOWEL RESECTION;  Surgeon: Gayland Curry, MD,FACS;  Location: [place];  Service: General;;  . CARDIOVASCULAR STRESS TEST  01-17-2012  DR HOCHREIN   LOW RISK NUCLEAR TEST  . CESAREAN SECTION     x 4 in remote past  . CHOLECYSTECTOMY    . CORONARY ARTERY BYPASS GRAFT  2003   by Dr. Servando Snare. LIMA to the LAD, free RIMA to the circumflex. Stress perfusion study December  2009 with no high-risk areas of ischemia. She has a well-preserved ejection fraction  . HYSTEROSCOPY W/D&C N/A 05/14/2014   Procedure: DILATATION AND CURETTAGE (no specimen); HYSTEROSCOPY;  Surgeon: Jonnie Kind, MD;  Location[facility];  Service: Gynecology;  Laterality: N/A;  . INCISION AND DRAINAGE OF WOUND  04/17/2012   Procedure: IRRIGATION AND DEBRIDEMENT WOUND;  Surgeon: Theodoro Kos, DO;  Location: [place];  Service: Plastics;  Laterality: N/A;  OF ABDOMINAL WOUND, SURGICAL PREP AND PLACEMENT OF VAC, REMOVAL FOREHEAD SKIN LESION  . INCISION AND DRAINAGE OF WOUND N/A 04/24/2012   Procedure: IRRIGATION AND DEBRIDEMENT WOUND;  Surgeon: Theodoro Kos, DO;  Location: [place];  Service: Plastics;  Laterality: N/A;  I & D ABDOMINAL WOUND WITH VAC AND ACELL  . INCISION AND DRAINAGE OF WOUND N/A 05/01/2012   Procedure: IRRIGATION AND DEBRIDEMENT WOUND;  Surgeon: Theodoro Kos, DO;  Location: [place];  Service: Plastics;  Laterality: N/A;  WITH SURGICAL PREP AND PLACEMENT OF VAC  . INCISION AND DRAINAGE OF WOUND N/A 05/08/2012   Procedure: IRRIGATION AND DEBRIDEMENT OF ABD WOUND SURGICAL PREP AND PLACEMENT OF VAC ;  Surgeon: Theodoro Kos, DO;  Location: [facility];  Service: Clinical cytogeneticist;  Laterality: N/A;  IRRIGATION AND DEBRIDEMENT OF ABD WOUND SURGICAL PREP AND PLACEMENT OF VAC   . INCISION AND DRAINAGE OF WOUND N/A 05/15/2012   Procedure: IRRIGATION AND DEBRIDEMENT OF ABDOMINAL WOUND WITH POSSIBLE SURGICAL PREP AND PLACEMENT OF VAC;  Surgeon: Theodoro Kos, DO;  Location: [place];  Service: Plastics;  Laterality: N/A;  . INCISION AND DRAINAGE OF WOUND N/A 05/22/2012   Procedure: IRRIGATION AND DEBRIDEMENT OF ABODOMINAL WOUND WITH  SURGICAL PREP AND VAC PLACEMENT;  Surgeon: Theodoro Kos, DO;  Location: [place];  Service: Plastics;  Laterality: N/A;  . INCISION AND DRAINAGE OF WOUND N/A 06/28/2012    Procedure: IRRIGATION AND DEBRIDEMENT OF ABDOMINAL ULCER SURGICAL PREP AND PLACEMENT OF ACELL AND VAC;  Surgeon: Theodoro Kos, DO;  Location: [place];  Service: Plastics;  Laterality: N/A;  . INCISIONAL HERNIA REPAIR  02/07/2012   Procedure: HERNIA REPAIR INCISIONAL;  Surgeon: Gayland Curry, MD,FACS;  Location: [place];  Service: General;;  Open, Primary repair, strangulated Incisional hernia.  Marland Kitchen LESION REMOVAL  04/17/2012   Procedure: LESION REMOVAL;  Surgeon: Theodoro Kos, DO;  Location: [place];  Service: Plastics;  Laterality: N/A;   CENTER OF FOREHEAD, REMOVAL FORHEAD SKIN LESION  . POLYPECTOMY N/A 05/14/2014   Procedure: ENDOMETRIAL POLYPECTOMY;  Surgeon: Jonnie Kind, MD;  Location[facility];  Service: Gynecology;  Laterality: N/A;    Family History  Problem Relation Age of Onset  . Heart attack Mother   . Hypertension Sister   . Diabetes Brother   . Birth defects Son        heart defect   Social History:  reports that she quit smoking about 5 years ago. Her smoking use included cigarettes. She has a 25.00 pack-year smoking history. she has never used smokeless tobacco. She reports that she does not drink alcohol or use drugs.  Allergies:  Allergies  Allergen Reactions  . Ace Inhibitors Cough    No medications prior to admission.    Results for orders placed or performed during the hospital encounter of 02/07/17 (from the past 48 hour(s))  Basic metabolic panel     Status: Abnormal   Collection Time: 02/07/17  9:59 AM  Result Value Ref Range   Sodium 138 135 - 145 mmol/L   Potassium 3.8 3.5 - 5.1 mmol/L   Chloride 103 101 - 111 mmol/L   CO2 25 22 - 32 mmol/L   Glucose, Bld 128 (H) 65 - 99 mg/dL   BUN 37 (H) 6 - 20 mg/dL   Creatinine, Ser 1.39 (H) 0.44 - 1.00 mg/dL   Calcium 9.3 8.9 - 10.3 mg/dL   GFR calc non Af Amer 39 (L) >60 mL/min   GFR calc Af Amer 46 (L) >60 mL/min    Comment: (NOTE) The eGFR has been calculated using the CKD  EPI equation. This calculation has not been validated in all clinical situations. eGFR's persistently <60 mL/min signify possible Chronic Kidney Disease.    Anion gap 10 5 - 15  CBC     Status: Abnormal   Collection Time: 02/07/17  9:59 AM  Result Value Ref Range   WBC 11.6 (H) 4.0 - 10.5 K/uL   RBC 4.71 3.87 - 5.11 MIL/uL   Hemoglobin 13.8 12.0 - 15.0 g/dL   HCT 42.2 36.0 - 46.0 %   MCV 89.6 78.0 - 100.0 fL   MCH 29.3 26.0 - 34.0 pg   MCHC  32.7 30.0 - 36.0 g/dL   RDW 14.8 11.5 - 15.5 %   Platelets 308 150 - 400 K/uL  Hemoglobin A1c     Status: None   Collection Time: 02/07/17  9:59 AM  Result Value Ref Range   Hgb A1c MFr Bld 5.6 4.8 - 5.6 %    Comment: (NOTE)         Prediabetes: 5.7 - 6.4         Diabetes: >6.4         Glycemic control for adults with diabetes: <7.0    Mean Plasma Glucose 114 mg/dL    Comment: (NOTE) Performed At: [facility] [place], [place] [id number] Rush Farmer MD TU:[id number]    No results found.  Review of Systems  All other systems reviewed and are negative.   There were no vitals taken for this visit. Physical Exam  Patient is alert, oriented, no adenopathy, well-dressed, normal affect, normal respiratory effort. Examination patient has a good dorsalis pedis pulse.  She has a severe pronation and valgus deformity through the midfoot with a severe Charcot collapse.  She has ulcer over the medial border of the talar head.  This was probed with a Q-tip and  probed 1 cm deep down to the talar head.  There is no ascending cellulitis there is no abscess no drainage.  Patient does not have protective sensation.  Clinically patient has classic destructive changes of her foot with diabetic Charcot arthropathy.   Assessment/Plan 1. 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 Subacute osteomyelitis, left ankle and foot ([place])     Plan: Will need to proceed with excision of the infected bone of  the talar head with reconstruction of the medial column with intramedullary screw fixation.  Discussed that patient has an increased risk of the wound not healing risk of potential amputation of the foot.  Discussed that she would need to be strict nonweightbearing after surgery.  Discussed that with weightbearing she could lose reduction and could break down the incision which would require amputation.  Discussed that she may require discharge to skilled nursing.  A prescription is called in for doxycycline.  We will set up surgery as soon as possible.     Newt Minion, MD 02/09/2017, 6:35 AM

## 2017-02-09 NOTE — Anesthesia Preprocedure Evaluation (Addendum)
Anesthesia Evaluation  Patient identified by MRN, date of birth, ID band Patient awake    Reviewed: Allergy & Precautions, NPO status , Patient's Chart, lab work & pertinent test results, reviewed documented beta blocker date and time   History of Anesthesia Complications (+) PROLONGED EMERGENCE and history of anesthetic complications ("low O2 sat after surgery ")  Airway Mallampati: III  TM Distance: >3 FB Neck ROM: Full    Dental  (+) Edentulous Upper, Edentulous Lower   Pulmonary neg COPD, former smoker,    breath sounds clear to auscultation       Cardiovascular hypertension, Pt. on medications and Pt. on home beta blockers + CAD, + Past MI, + CABG and + DOE   Rhythm:Regular Rate:Bradycardia  EKG - SB with LVH   Neuro/Psych PSYCHIATRIC DISORDERS Anxiety negative neurological ROS     GI/Hepatic Neg liver ROS, neg GERD  ,  Endo/Other  Morbid obesity  Renal/GU Renal InsufficiencyRenal disease     Musculoskeletal  (+) Arthritis ,   Abdominal (+) + obese,   Peds  Hematology   Anesthesia Other Findings Basal cell ca  Reproductive/Obstetrics                           Anesthesia Physical  Anesthesia Plan  ASA: III  Anesthesia Plan: General   Post-op Pain Management:  Regional for Post-op pain   Induction: Intravenous  PONV Risk Score and Plan: 4 or greater and Ondansetron, Dexamethasone and Midazolam  Airway Management Planned: LMA  Additional Equipment:   Intra-op Plan:   Post-operative Plan: Extubation in OR  Informed Consent: I have reviewed the patients History and Physical, chart, labs and discussed the procedure including the risks, benefits and alternatives for the proposed anesthesia with the patient or authorized representative who has indicated his/her understanding and acceptance.     Plan Discussed with: CRNA and Surgeon  Anesthesia Plan Comments:          Anesthesia Quick Evaluation

## 2017-02-09 NOTE — Anesthesia Postprocedure Evaluation (Signed)
Anesthesia Post Note  Patient: Julia West  Procedure(s) Performed: EXCISION TALAR HEAD, INTERNAL FIXATION MEDIAL COLUMN LEFT FOOT (Left )     Patient location during evaluation: PACU Anesthesia Type: General Level of consciousness: awake and alert Pain management: pain level controlled Vital Signs Assessment: post-procedure vital signs reviewed and stable Respiratory status: spontaneous breathing, nonlabored ventilation, respiratory function stable and patient connected to nasal cannula oxygen Cardiovascular status: blood pressure returned to baseline and stable Postop Assessment: no apparent nausea or vomiting Anesthetic complications: no    Last Vitals:  Vitals:   02/09/17 1330 02/09/17 1355  BP: (!) 141/47   Pulse: 62 78  Resp: 12   Temp:  36.6 C  SpO2: 93% 96%    Last Pain:  Vitals:   02/09/17 1355  TempSrc:   PainSc: 4     LLE Motor Response: Purposeful movement;Responds to commands (02/09/17 1355) LLE Sensation: Full sensation (02/09/17 1355)          Audry Pili

## 2017-02-09 NOTE — Plan of Care (Signed)
  Clinical Measurements: Ability to maintain clinical measurements within normal limits will improve 02/09/2017 1458 - Progressing by Williams Che, RN   Coping: Level of anxiety will decrease 02/09/2017 1458 - Progressing by Williams Che, RN   Pain Managment: General experience of comfort will improve 02/09/2017 1458 - Progressing by Williams Che, RN   Safety: Ability to remain free from injury will improve 02/09/2017 1458 - Progressing by Williams Che, RN

## 2017-02-09 NOTE — Progress Notes (Signed)
Orthopedic Tech Progress Note Patient Details:  Julia West 05-31-1955 [id number]  Ortho Devices Type of Ortho Device: CAM walker Ortho Device/Splint Location: lle Ortho Device/Splint Interventions: Application   Juanna Pudlo 02/09/2017, 1:28 PM

## 2017-02-09 NOTE — Anesthesia Procedure Notes (Signed)
Anesthesia Regional Block: Adductor canal block   Pre-Anesthetic Checklist: ,, timeout performed, Correct Patient, Correct Site, Correct Laterality, Correct Procedure, Correct Position, site marked, Risks and benefits discussed,  Surgical consent,  Pre-op evaluation,  At surgeon's request and post-op pain management  Laterality: Left  Prep: chloraprep       Needles:  Injection technique: Single-shot  Needle Type: Echogenic Needle     Needle Length: 10cm  Needle Gauge: 21     Additional Needles:   Narrative:  Start time: 02/09/2017 10:18 AM End time: 02/09/2017 10:23 AM Injection made incrementally with aspirations every 5 mL.  Performed by: Personally  Anesthesiologist: Audry Pili, MD  Additional Notes: No pain on injection. No increased resistance to injection. Injection made in 5cc increments. Good needle visualization. Patient tolerated the procedure well.

## 2017-02-09 NOTE — Transfer of Care (Signed)
Immediate Anesthesia Transfer of Care Note  Patient: Julia West  Procedure(s) Performed: EXCISION TALAR HEAD, INTERNAL FIXATION MEDIAL COLUMN LEFT FOOT (Left )  Patient Location: PACU  Anesthesia Type:GA combined with regional for post-op pain  Level of Consciousness: awake, alert  and oriented  Airway & Oxygen Therapy: Patient Spontanous Breathing and Patient connected to nasal cannula oxygen  Post-op Assessment: Report given to RN and Post -op Vital signs reviewed and stable  Post vital signs: Reviewed and stable  Last Vitals:  Vitals:   02/09/17 1055 02/09/17 1243  BP: (!) 152/50   Pulse: (!) 50 (P) 85  Resp: 13 (P) 12  Temp:  (P) 36.4 C  SpO2: 100% (P) 97%    Last Pain:  Vitals:   02/09/17 1055  TempSrc:   PainSc: 0-No pain      Patients Stated Pain Goal: 3 (83/25/49 8264)  Complications: No apparent anesthesia complications

## 2017-02-09 NOTE — Anesthesia Procedure Notes (Addendum)
Procedure Name: LMA Insertion Date/Time: 02/09/2017 11:31 AM Performed by: Imagene Riches, CRNA Pre-anesthesia Checklist: Patient identified, Emergency Drugs available, Suction available and Patient being monitored Patient Re-evaluated:Patient Re-evaluated prior to induction Oxygen Delivery Method: Circle System Utilized Preoxygenation: Pre-oxygenation with 100% oxygen Induction Type: IV induction Ventilation: Mask ventilation without difficulty LMA: LMA inserted LMA Size: 5.0 Number of attempts: 1 Placement Confirmation: positive ETCO2 Tube secured with: Tape Dental Injury: Teeth and Oropharynx as per pre-operative assessment

## 2017-02-09 NOTE — Op Note (Signed)
02/09/2017  12:42 PM  PATIENT:  Julia West    PRE-OPERATIVE DIAGNOSIS:  Osteomyelitis Left Talar Head, Charcot Foot  POST-OPERATIVE DIAGNOSIS:  Same  PROCEDURE:  EXCISION TALAR HEAD,  INTERNAL FIXATION MEDIAL COLUMN LEFT FOOT Local tissue rearrangement for wound closure 10 x 4 cm. C arm fluoroscopy.  SURGEON:  Newt Minion, MD  PHYSICIAN ASSISTANT:None ANESTHESIA:   General  PREOPERATIVE INDICATIONS:  Julia West is a  61 y.o. female with a diagnosis of Osteomyelitis Left Talar Head, Charcot Foot who failed conservative measures and elected for surgical management.    The risks benefits and alternatives were discussed with the patient preoperatively including but not limited to the risks of infection, bleeding, nerve injury, cardiopulmonary complications, the need for revision surgery, among others, and the patient was willing to proceed.  OPERATIVE IMPLANTS: Synthes medial column plate  OPERATIVE FINDINGS: No abscess complete Charcot collapse through the midfoot  OPERATIVE PROCEDURE: Patient was brought the operating room and underwent a general anesthetic.  After adequate levels of anesthesia were obtained patient's left lower extremity was prepped using DuraPrep draped into a sterile field a timeout was called.  A medial incision was made and this was used to encompass the ulcerative tissue this left a wound that was 10 x 4 cm.  Sharp dissection was carried down to the talonavicular joint.  The dislocated talar head the distal centimeter was resected.  The base of the navicular was also debrided of cartilage back to subchondral bone.  This had good petechial bleeding.  The medial column was reduced and a Synthes plate was applied along the medial column.  3 compression screws were placed proximally into the talus.  Compression and locking screws were placed distally with screws into the navicular and screws into the base of the first metatarsal.  C-arm fluoroscopy  verified alignment.  This reduced the pronation and valgus of the forefoot and provide a straight minor grade foot.  Local tissue rearrangement was used to close the wound that was 10 x 4 cm.  2-0 nylon was used for skin closure.  The wound was irrigated with normal saline throughout the case.  A sterile dressing was applied patient was extubated taken the PACU in stable condition.

## 2017-02-10 NOTE — Plan of Care (Signed)
  Coping: Level of anxiety will decrease 02/10/2017 1320 - Progressing by Williams Che, RN   Elimination: Will not experience complications related to bowel motility 02/10/2017 1320 - Progressing by Williams Che, RN   Pain Managment: General experience of comfort will improve 02/10/2017 1320 - Progressing by Williams Che, RN   Safety: Ability to remain free from injury will improve 02/10/2017 1320 - Progressing by Williams Che, RN

## 2017-02-10 NOTE — NC FL2 (Signed)
[place] MEDICAID FL2 LEVEL OF CARE SCREENING TOOL     IDENTIFICATION  Patient Name: Julia West Birthdate: Nov 20, 1955 Sex: female Admission Date (Current Location): 02/09/2017  [facility] and [place] Number:  Herbalist and Address:  [facility] [place], [place] [address] [place], [place] [address]      Provider Number: [id number]  Attending Physician Name and Address:  Newt Minion, MD  Relative Name and Phone Number:  Eddie Dibbles, spouse, [phone]    Current Level of Care: Hospital Recommended Level of Care: [place] Prior Approval Number:    Date Approved/Denied:   PASRR Number: [id number]  Discharge Plan: SNF    Current Diagnoses: Patient Active Problem List   Diagnosis Date Noted  . Charcot foot due to diabetes mellitus ([place]) 02/09/2017  . 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 Post-menopausal bleeding 04/15/2014  . Endometrial polyp 04/09/2014  . Ventral hernia 03/06/2012  . Open abdominal wall wound 03/06/2012  . Hyperlipemia 11/12/2008  . OBESITY, MORBID 11/12/2008  . Essential hypertension 11/12/2008  . CAD 11/12/2008    Orientation RESPIRATION BLADDER Height & Weight     Self, Time, Situation, Place  O2(Nasal cannula 2L) Continent Weight:   Height:     BEHAVIORAL SYMPTOMS/MOOD NEUROLOGICAL BOWEL NUTRITION STATUS      Continent Diet(Please see DC Summary)  AMBULATORY STATUS COMMUNICATION OF NEEDS Skin   Limited Assist Verbally Surgical wounds(Closed incision on foot and abdomen)                       Personal Care Assistance Level of Assistance  Bathing, Feeding, Dressing Bathing Assistance: Maximum assistance Feeding assistance: Independent Dressing Assistance: Limited assistance     Functional Limitations Info             SPECIAL CARE FACTORS FREQUENCY  PT (By licensed PT)     PT Frequency: 5x/week              Contractures      Additional  Factors Info  Code Status, Allergies Code Status Info: Full Allergies Info: Ace Inhibitors           Current Medications (02/10/2017):  This is the current hospital active medication list Current Facility-Administered Medications  Medication Dose Route Frequency Provider Last Rate Last Dose  . 0.9 %  sodium chloride infusion   Intravenous Continuous Newt Minion, MD 10 mL/hr at 02/09/17 1809    . acetaminophen (TYLENOL) tablet 650 mg  650 mg Oral Q4H PRN Newt Minion, MD       Or  . acetaminophen (TYLENOL) suppository 650 mg  650 mg Rectal Q4H PRN Newt Minion, MD      . aspirin EC tablet 325 mg  325 mg Oral Daily Newt Minion, MD   325 mg at 02/10/17 0901  . bisacodyl (DULCOLAX) suppository 10 mg  10 mg Rectal Daily PRN Newt Minion, MD      . docusate sodium (COLACE) capsule 100 mg  100 mg Oral BID Newt Minion, MD   100 mg at 02/10/17 0901  . furosemide (LASIX) tablet 20 mg  20 mg Oral Daily Newt Minion, MD   20 mg at 02/10/17 0902  . irbesartan (AVAPRO) tablet 150 mg  150 mg Oral Daily Newt Minion, MD       And  . hydrochlorothiazide (MICROZIDE) capsule 12.5 mg  12.5 mg Oral  Daily Newt Minion, MD      . HYDROcodone-acetaminophen (NORCO/VICODIN) 5-325 MG per tablet 1 tablet  1 tablet Oral Q4H PRN Newt Minion, MD   1 tablet at 02/10/17 1032  . HYDROmorphone (DILAUDID) injection 1 mg  1 mg Intravenous Q2H PRN Newt Minion, MD   1 mg at 02/10/17 0504  . magnesium citrate solution 1 Bottle  1 Bottle Oral Once PRN Newt Minion, MD      . methocarbamol (ROBAXIN) tablet 500 mg  500 mg Oral Q6H PRN Newt Minion, MD   500 mg at 02/10/17 0900   Or  . methocarbamol (ROBAXIN) 500 mg in dextrose 5 % 50 mL IVPB  500 mg Intravenous Q6H PRN Newt Minion, MD      . metoCLOPramide (REGLAN) tablet 5-10 mg  5-10 mg Oral Q8H PRN Newt Minion, MD       Or  . metoCLOPramide (REGLAN) injection 5-10 mg  5-10 mg Intravenous Q8H PRN Newt Minion, MD      . metoprolol  tartrate (LOPRESSOR) tablet 12.5 mg  12.5 mg Oral BID Newt Minion, MD   12.5 mg at 02/09/17 2057  . ondansetron (ZOFRAN) tablet 4 mg  4 mg Oral Q6H PRN Newt Minion, MD       Or  . ondansetron [facility]) injection 4 mg  4 mg Intravenous Q6H PRN Newt Minion, MD   4 mg at 02/10/17 0100  . oxyCODONE (Oxy IR/ROXICODONE) immediate release tablet 10 mg  10 mg Oral Q3H PRN Newt Minion, MD   10 mg at 02/10/17 1354  . polyethylene glycol (MIRALAX / GLYCOLAX) packet 17 g  17 g Oral Daily PRN Newt Minion, MD      . traZODone (DESYREL) tablet 150 mg  150 mg Oral QHS PRN Newt Minion, MD   150 mg at 02/09/17 2053     Discharge Medications: Please see discharge summary for a list of discharge medications.  Relevant Imaging Results:  Relevant Lab Results:   Additional Information SSN: [place], [place]

## 2017-02-10 NOTE — OR Nursing (Signed)
Late entry due to implant correction. 

## 2017-02-10 NOTE — Progress Notes (Signed)
Patient ID: Julia West, female   DOB: 02/21/1956, 61 y.o.   MRN: [id number] Postoperative day 1 reconstruction left foot status post Charcot arthropathy and collapse.  Patient has no complaints this morning, plan for physical therapy progressive ambulation nonweightbearing on the left I do not think the patient has the resources at home or will have the ability to maintain nonweightbearing.  Patient will need discharge to skilled nursing.

## 2017-02-10 NOTE — Clinical Social Work Note (Signed)
Clinical Social Work Assessment  Patient Details  Name: Julia West MRN: [id number] Date of Birth: 1955-11-04  Date of referral:  02/10/17               Reason for consult:  Facility Placement                Permission sought to share information with:  Chartered certified accountant granted to share information::  Yes, Verbal Permission Granted  Name::     Shavaun Osterloh  Agency::  SNF  Relationship::  spouse  Contact Information:     Housing/Transportation Living arrangements for the past 2 months:  [place] of Information:  Patient Patient Interpreter Needed:  None Criminal Activity/Legal Involvement Pertinent to Current Situation/Hospitalization:  No - Comment as needed Significant Relationships:  Adult Children, Spouse, Other Family Members Lives with:  Spouse Do you feel safe going back to the place where you live?  No Need for family participation in patient care:  No (Coment)  Care giving concerns:  Pt from home with spouse. Pt indicated that she managed her ADL's at home independently, however her husband does the cooking. Pt ambulated with at cane prior to hospitalization. Pt agrees with recommendation to [facility] and is interested in [facility] in [place].  CSW Will f/u on SNF placement.  Social Worker assessment / plan:  To assist with transition to SNF when medically ready. CSW will f/u on SNF offers and [facility] will be priority. CSW will assist with transport.  Employment status:  Disabled (Comment on whether or not currently receiving Disability) Insurance information:  Managed Medicare PT Recommendations:  [place] / Referral to community resources:  [place]  Patient/Family's Response to care:  Patient appreciative of CSW meeting to discuss SNF plans and disposition. No issues or concerns identified.  Patient/Family's Understanding of and Emotional Response to Diagnosis, Current  Treatment, and Prognosis:  Patient has good understanding of diagnosis, current treatment and prognosis as evidenced by her agreement to SNF placement and identifying that she can not return home until her impairment is improved. CSW will assist with placement. No issues or concerns identified.  Emotional Assessment Appearance:  Appears stated age Attitude/Demeanor/Rapport:  (Cooperative) Affect (typically observed):  Accepting, Appropriate Orientation:  Oriented to Situation, Oriented to  Time, Oriented to Place, Oriented to Self Alcohol / Substance use:  Not Applicable Psych involvement (Current and /or in the community):  No (Comment)  Discharge Needs  Concerns to be addressed:  Discharge Planning Concerns Readmission within the last 30 days:  No Current discharge risk:  Dependent with Mobility, Physical Impairment Barriers to Discharge:  No Barriers Identified   Normajean Baxter, LCSW 02/10/2017, 4:36 PM

## 2017-02-10 NOTE — Evaluation (Signed)
Physical Therapy Evaluation Patient Details Name: Julia West MRN: [id number] DOB: Apr 08, 1955 Today's Date: 02/10/2017   History of Present Illness  61 y.o female with Charcot arthropathy now s/p reconstruction of left foot on 11/27.  PMH includes: morbid obesity, lymphedema, HTN, dyspnea, basal cell cancer, anxiety, acute myocardial infarction,  CABG,     Clinical Impression  Patient is s/p above surgery resulting in functional limitations due to the deficits listed below (see PT Problem List). PTA, pt was mod I with all mobility utilizing cane for ambulation and electric scooter for long distances. Pt lives with husband in 1 story home with 6 stairs to enter. Upon eval, patient presenting with post op pain and weakness, obiesty, and fatigue that limits functional mobility. Currently min A x 2 for transfers, able to stand pivot into [facility] and bedside chair. Patient and I agree that SNF will be the best course for her before returning to home. Next PT session to progress OOB mobility and gait train. Patient will benefit from skilled PT to increase their independence and safety with mobility to allow discharge to the venue listed below.       Follow Up Recommendations SNF;DC plan and follow up therapy as arranged by surgeon;Supervision/Assistance - 24 hour    Equipment Recommendations  None recommended by PT    Recommendations for Other Services OT consult     Precautions / Restrictions Precautions Precautions: Fall Precaution Comments: NWB LLE, weakness, obeisty Restrictions Weight Bearing Restrictions: Yes LLE Weight Bearing: Non weight bearing      Mobility  Bed Mobility Overal bed mobility: Needs Assistance Bed Mobility: Supine to Sit     Supine to sit: Supervision     General bed mobility comments: HOB elevated, using bed rails to rise with BUE support. once sitting, good balance   Transfers Overall transfer level: Needs assistance Equipment used: Rolling walker  (2 wheeled) Transfers: Sit to/from [facility] Sit to Stand: Min assist;+2 physical assistance Stand pivot transfers: Min assist;+2 physical assistance       General transfer comment: Min A x2 to power up in walker. cues for hand placement and safety with transfers as patient allows the RW to travel to far from her body. Patient sits down prematurly, educated patient on safer seqeucning. Overall using scooting with RLE while adhering to WB status of LLE. unale to hop at this time.   Ambulation/Gait                Stairs            Wheelchair Mobility    Modified Rankin (Stroke Patients Only)       Balance Overall balance assessment: Needs assistance Sitting-balance support: Feet unsupported;No upper extremity supported Sitting balance-Leahy Scale: Good     Standing balance support: During functional activity;Bilateral upper extremity supported Standing balance-Leahy Scale: Poor Standing balance comment: Heavy reliance on BUE support during dynamic activity.                             Pertinent Vitals/Pain Pain Assessment: 0-10 Pain Score: 3  Pain Location: L FOOT Pain Descriptors / Indicators: Discomfort;Grimacing    Home Living Family/patient expects to be discharged to:: Skilled nursing facility Living Arrangements: Spouse/significant other               Additional Comments: Patient lives in 1 story condo with 6 stairs to eneter, BL rails, standard height toliet, tub with no grab  bars. Patient lives with husband who is able bodied and avail for support 24/7.     Prior Function Level of Independence: Independent with assistive device(s)         Comments: ambulation with SPC     Hand Dominance        Extremity/Trunk Assessment   Upper Extremity Assessment Upper Extremity Assessment: Overall WFL for tasks assessed    Lower Extremity Assessment Lower Extremity Assessment: Difficult to assess due to impaired  cognition(Strength RLE globally 4/5 LLE globally 3/5 post op pain ) LLE Deficits / Details: weakness from surgery.        Communication   Communication: No difficulties  Cognition Arousal/Alertness: Awake/alert Behavior During Therapy: WFL for tasks assessed/performed Overall Cognitive Status: Within Functional Limits for tasks assessed                                        General Comments General comments (skin integrity, edema, etc.): SpO2=87-92 on RA at rest, >90 with activity on 2L. HR=45-65 throughout session.     Exercises General Exercises - Lower Extremity Ankle Circles/Pumps: AROM;Right;10 reps Long Arc Quad: AROM;Both;10 reps Straight Leg Raises: AROM;Both;10 reps   Assessment/Plan    PT Assessment Patient needs continued PT services  PT Problem List Decreased strength;Decreased range of motion;Decreased activity tolerance;Decreased knowledge of use of DME;Decreased mobility;Decreased balance;Pain;Obesity       PT Treatment Interventions DME instruction;Stair training;Gait training;Functional mobility training;Therapeutic activities;Therapeutic exercise;Balance training    PT Goals (Current goals can be found in the Care Plan section)  Acute Rehab PT Goals Patient Stated Goal: to go to SNF PT Goal Formulation: With patient Time For Goal Achievement: 02/17/17 Potential to Achieve Goals: Good    Frequency Min 3X/week   Barriers to discharge        Co-evaluation               AM-PAC PT "6 Clicks" Daily Activity  Outcome Measure Difficulty turning over in bed (including adjusting bedclothes, sheets and blankets)?: A Little Difficulty moving from lying on back to sitting on the side of the bed? : A Little Difficulty sitting down on and standing up from a chair with arms (e.g., wheelchair, bedside commode, etc,.)?: Unable Help needed moving to and from a bed to chair (including a wheelchair)?: A Lot Help needed walking in hospital room?:  A Lot Help needed climbing 3-5 steps with a railing? : Total 6 Click Score: 12    End of Session Equipment Utilized During Treatment: Gait belt;Oxygen Activity Tolerance: Patient limited by fatigue Patient left: in chair;with call bell/phone within reach Nurse Communication: Mobility status PT Visit Diagnosis: Unsteadiness on feet (R26.81);Other abnormalities of gait and mobility (R26.89);Repeated falls (R29.6);Muscle weakness (generalized) (M62.81);Pain Pain - Right/Left: Left Pain - part of body: Ankle and joints of foot    Time: 0851-0920 PT Time Calculation (min) (ACUTE ONLY): 29 min   Charges:   PT Evaluation $PT Eval Moderate Complexity: 1 Mod PT Treatments $Therapeutic Activity: 8-22 mins   PT G Codes:        Reinaldo Berber, PT, DPT Acute Rehab Services Pager: [phone]    Reinaldo Berber 02/10/2017, 10:06 AM

## 2017-02-11 NOTE — Plan of Care (Signed)
  Coping: Level of anxiety will decrease 02/11/2017 1312 - Progressing by Williams Che, RN   Elimination: Will not experience complications related to bowel motility 02/11/2017 1312 - Progressing by Williams Che, RN   Pain Managment: General experience of comfort will improve 02/11/2017 1312 - Progressing by Williams Che, RN   Safety: Ability to remain free from injury will improve 02/11/2017 1312 - Progressing by Williams Che, RN

## 2017-02-11 NOTE — Progress Notes (Signed)
Patient ID: Julia West, female   DOB: June 22, 1955, 61 y.o.   MRN: [id number] Postoperative day 2 open reduction internal fixation Charcot collapse left foot.  Patient is strict nonweightbearing left lower extremity.  FL 2 is completed.  Anticipate discharge to skilled nursing on Monday.

## 2017-02-11 NOTE — Progress Notes (Signed)
Physical Therapy Treatment Patient Details Name: Julia West MRN: [id number] DOB: 11/22/1955 Today's Date: 02/11/2017    History of Present Illness 61 y.o female with Charcot arthropathy now s/p reconstruction of left foot on 11/27.  PMH includes: morbid obesity, lymphedema, HTN, dyspnea, basal cell cancer, anxiety, acute myocardial infarction,  CABG,     PT Comments    Session focused on transfer and gait training. Pt ambulating with min guard to bathroom and back to bed at this time, adhering to WB status. Pt eager and encouraged to continue to progress. Demonstrating improvements in safety with transfers. Next session should progress activity tolerance and ensure cary over of safety with transfers.      Follow Up Recommendations  SNF;DC plan and follow up therapy as arranged by surgeon;Supervision/Assistance - 24 hour     Equipment Recommendations  None recommended by PT    Recommendations for Other Services       Precautions / Restrictions Precautions Precautions: Fall Precaution Comments: NWB LLE, weakness, obeisty Restrictions Weight Bearing Restrictions: Yes LLE Weight Bearing: Non weight bearing    Mobility  Bed Mobility Overal bed mobility: Needs Assistance Bed Mobility: Sit to Supine       Sit to supine: Supervision   General bed mobility comments: able to sit to supine without physical assistance, utilizing bed rail.   Transfers Overall transfer level: Needs assistance Equipment used: Rolling walker (2 wheeled) Transfers: Sit to/from [facility] Sit to Stand: Min assist;+2 physical assistance Stand pivot transfers: Min assist;+2 physical assistance       General transfer comment: Min A x1 with x2 to follow with chair for safety. 1 LOB when sit to stand, able to do without LOB x3 this session. Patient improved with sequencing today, but could still use reinforcment in subsquesnt sessions.   Ambulation/Gait Ambulation/Gait  assistance: Min assist;+2 safety/equipment Ambulation Distance (Feet): 15 Feet Assistive device: Rolling walker (2 wheeled) Gait Pattern/deviations: Step-to pattern(R foot hop to gait with RW) Gait velocity: decreased   General Gait Details: Min guard-Min A for ambulation for balance, intermittent foot scooting to hop to gait, adherence to WB precautions with some light cueing. Overall progrsessing well with gait at this time.    Stairs            Wheelchair Mobility    Modified Rankin (Stroke Patients Only)       Balance Overall balance assessment: Needs assistance Sitting-balance support: Feet unsupported;No upper extremity supported Sitting balance-Leahy Scale: Good     Standing balance support: During functional activity;Bilateral upper extremity supported Standing balance-Leahy Scale: Poor Standing balance comment: Heavy reliance on BUE support during dynamic activity.                            Cognition Arousal/Alertness: Awake/alert Behavior During Therapy: WFL for tasks assessed/performed Overall Cognitive Status: Within Functional Limits for tasks assessed                                        Exercises General Exercises - Lower Extremity Ankle Circles/Pumps: AROM;Right;10 reps Long Arc Quad: AROM;Both;10 reps Straight Leg Raises: AROM;Both;10 reps    General Comments General comments (skin integrity, edema, etc.): SpO2=>90% on RA with activity       Pertinent Vitals/Pain Pain Assessment: Faces Faces Pain Scale: Hurts little more Pain Location: L FOOT Pain Descriptors / Indicators:  Discomfort;Grimacing Pain Intervention(s): Limited activity within patient's tolerance;Monitored during session;Premedicated before session    Home Living                      Prior Function            PT Goals (current goals can now be found in the care plan section) Acute Rehab PT Goals Patient Stated Goal: to go to SNF PT  Goal Formulation: With patient Time For Goal Achievement: 02/17/17 Potential to Achieve Goals: Good Progress towards PT goals: Progressing toward goals    Frequency    Min 3X/week      PT Plan Current plan remains appropriate    Co-evaluation              AM-PAC PT "6 Clicks" Daily Activity  Outcome Measure  Difficulty turning over in bed (including adjusting bedclothes, sheets and blankets)?: A Little Difficulty moving from lying on back to sitting on the side of the bed? : A Little Difficulty sitting down on and standing up from a chair with arms (e.g., wheelchair, bedside commode, etc,.)?: Unable Help needed moving to and from a bed to chair (including a wheelchair)?: A Lot Help needed walking in hospital room?: A Lot Help needed climbing 3-5 steps with a railing? : Total 6 Click Score: 12    End of Session Equipment Utilized During Treatment: Gait belt Activity Tolerance: Patient limited by fatigue Patient left: in chair;with call bell/phone within reach Nurse Communication: Mobility status PT Visit Diagnosis: Unsteadiness on feet (R26.81);Other abnormalities of gait and mobility (R26.89);Repeated falls (R29.6);Muscle weakness (generalized) (M62.81);Pain Pain - Right/Left: Left Pain - part of body: Ankle and joints of foot     Time: 4235-3614 PT Time Calculation (min) (ACUTE ONLY): 31 min  Charges:  $Gait Training: 8-22 mins $Therapeutic Activity: 8-22 mins                    G Codes:       Reinaldo Berber, PT, DPT Acute Rehab Services Pager: [phone]     Reinaldo Berber 02/11/2017, 2:16 PM

## 2017-02-12 ENCOUNTER — Encounter (HOSPITAL_COMMUNITY): Payer: Self-pay | Admitting: Orthopedic Surgery

## 2017-02-12 NOTE — Progress Notes (Signed)
Physical Therapy Treatment Patient Details Name: Julia West MRN: [id number] DOB: Jul 20, 1955 Today's Date: 02/12/2017    History of Present Illness 61 y.o female with Charcot arthropathy now s/p reconstruction of left foot on 11/27.  PMH includes: morbid obesity, lymphedema, HTN, dyspnea, basal cell cancer, anxiety, acute myocardial infarction,  CABG,     PT Comments    Continuing work on functional mobility and activity tolerance;  Difficulty maintaining NWB LLE in CAM boot today; we discussed options for mobility, like wheelchair, if she has difficulty keeping NWB   Follow Up Recommendations  SNF;DC plan and follow up therapy as arranged by surgeon;Supervision/Assistance - 24 hour     Equipment Recommendations  None recommended by PT    Recommendations for Other Services       Precautions / Restrictions Precautions Precautions: Fall Precaution Comments: NWB LLE, weakness, obeisty Restrictions LLE Weight Bearing: Non weight bearing    Mobility  Bed Mobility                  Transfers Overall transfer level: Needs assistance Equipment used: Rolling walker (2 wheeled) Transfers: Sit to/from Stand Sit to Stand: Mod assist;+2 physical assistance         General transfer comment: Cues for hand placement and safety; Mod assist to rise and steady while pt moved her hands from armrests to RW  Ambulation/Gait Ambulation/Gait assistance: Mod assist;+2 safety/equipment Ambulation Distance (Feet): 2 Feet Assistive device: Rolling walker (2 wheeled) Gait Pattern/deviations: Step-to pattern(R foot hop to gait with RW)     General Gait Details: Difficulty maintaining NWB LLE with Cam boot on and sneaker on R foot; took 2 steps, but unable to maintain NWB LLE   Stairs            Wheelchair Mobility    Modified Rankin (Stroke Patients Only)       Balance     Sitting balance-Leahy Scale: Good       Standing balance-Leahy Scale: Poor                               Cognition Arousal/Alertness: Awake/alert Behavior During Therapy: WFL for tasks assessed/performed Overall Cognitive Status: Within Functional Limits for tasks assessed                                        Exercises      General Comments General comments (skin integrity, edema, etc.): SpO2=>90% on RA with activity       Pertinent Vitals/Pain Pain Assessment: Faces Faces Pain Scale: Hurts little more Pain Location: L FOOT Pain Descriptors / Indicators: Discomfort;Grimacing Pain Intervention(s): Limited activity within patient's tolerance    Home Living                      Prior Function            PT Goals (current goals can now be found in the care plan section) Acute Rehab PT Goals Patient Stated Goal: to go to SNF PT Goal Formulation: With patient Time For Goal Achievement: 02/17/17 Potential to Achieve Goals: Good Progress towards PT goals: Not progressing toward goals - comment(Difficulty keeping NWB LLE)    Frequency    Min 3X/week      PT Plan Current plan remains appropriate    Co-evaluation  AM-PAC PT "6 Clicks" Daily Activity  Outcome Measure  Difficulty turning over in bed (including adjusting bedclothes, sheets and blankets)?: A Little Difficulty moving from lying on back to sitting on the side of the bed? : A Little Difficulty sitting down on and standing up from a chair with arms (e.g., wheelchair, bedside commode, etc,.)?: Unable Help needed moving to and from a bed to chair (including a wheelchair)?: Total Help needed walking in hospital room?: Total Help needed climbing 3-5 steps with a railing? : Total 6 Click Score: 10    End of Session Equipment Utilized During Treatment: Gait belt Activity Tolerance: Patient limited by fatigue Patient left: in chair;with call bell/phone within reach;with family/visitor present Nurse Communication: Mobility status PT Visit  Diagnosis: Unsteadiness on feet (R26.81);Other abnormalities of gait and mobility (R26.89);Repeated falls (R29.6);Muscle weakness (generalized) (M62.81);Pain Pain - Right/Left: Left Pain - part of body: Ankle and joints of foot     Time: 1335-1402 PT Time Calculation (min) (ACUTE ONLY): 27 min  Charges:  $Gait Training: 8-22 mins $Therapeutic Activity: 8-22 mins                    G Codes:       Roney Marion, PT  Acute Rehabilitation Services Pager [phone] Office [place] 02/12/2017, 2:58 PM

## 2017-02-12 NOTE — Progress Notes (Signed)
Subjective: 3 Days Post-Op Procedure(s) (LRB): EXCISION TALAR HEAD, INTERNAL FIXATION MEDIAL COLUMN LEFT FOOT (Left) Patient reports pain as mild and moderate.    Objective: Vital signs in last 24 hours: Temp:  [98.1 F (36.7 C)-99.9 F (37.7 C)] 99 F (37.2 C) (12/01 0600) Pulse Rate:  [50-65] 54 (12/01 0842) Resp:  [15] 15 (12/01 0600) BP: (124-142)/(32-41) 135/32 (12/01 0842) SpO2:  [93 %-95 %] 95 % (12/01 0600)  Intake/Output from previous day: 11/30 0701 - 12/01 0700 In: 500 [P.O.:500] Out: -  Intake/Output this shift: No intake/output data recorded.  No results for input(s): HGB in the last 72 hours. No results for input(s): WBC, RBC, HCT, PLT in the last 72 hours. No results for input(s): NA, K, CL, CO2, BUN, CREATININE, GLUCOSE, CALCIUM in the last 72 hours. No results for input(s): LABPT, INR in the last 72 hours.  Sensation intact distally Dorsiflexion/Plantar flexion intact  Assessment/Plan: 3 Days Post-Op Procedure(s) (LRB): EXCISION TALAR HEAD, INTERNAL FIXATION MEDIAL COLUMN LEFT FOOT (Left) Up with therapy  Biagio Borg 02/12/2017, 10:08 AM

## 2017-02-13 MED ORDER — ORAL CARE MOUTH RINSE
15.0000 mL | Freq: Two times a day (BID) | OROMUCOSAL | Status: DC
  Administered 2017-02-13 – 2017-02-14 (×2): 15 mL via OROMUCOSAL

## 2017-02-13 NOTE — Progress Notes (Signed)
Subjective: 4 Days Post-Op Procedure(s) (LRB): EXCISION TALAR HEAD, INTERNAL FIXATION MEDIAL COLUMN LEFT FOOT (Left) Patient reports pain as mild and moderate.    Objective: Vital signs in last 24 hours: Temp:  [97.7 F (36.5 C)-99.6 F (37.6 C)] 98.9 F (37.2 C) (12/02 0424) Pulse Rate:  [50-58] 58 (12/02 0424) Resp:  [15-18] 18 (12/02 0853) BP: (126-140)/(40-58) 140/58 (12/02 0424) SpO2:  [96 %-97 %] 97 % (12/01 2015)  Intake/Output from previous day: 12/01 0701 - 12/02 0700 In: 240 [P.O.:240] Out: -  Intake/Output this shift: Total I/O In: 240 [P.O.:240] Out: -   No results for input(s): HGB in the last 72 hours. No results for input(s): WBC, RBC, HCT, PLT in the last 72 hours. No results for input(s): NA, K, CL, CO2, BUN, CREATININE, GLUCOSE, CALCIUM in the last 72 hours. No results for input(s): LABPT, INR in the last 72 hours.  Toes warm with good cap refill.  Moving toes without difficulty.  Sensate  Assessment/Plan: 4 Days Post-Op Procedure(s) (LRB): EXCISION TALAR HEAD, INTERNAL FIXATION MEDIAL COLUMN LEFT FOOT (Left) Advance diet  Awaiting discharge on Monday  Brian Petrarca 02/13/2017, 10:30 AM

## 2017-02-13 NOTE — Progress Notes (Signed)
Physical Therapy Treatment Patient Details Name: Julia West MRN: [id number] DOB: 24-Jan-1956 Today's Date: 02/13/2017    History of Present Illness 61 y.o female with Charcot arthropathy now s/p reconstruction of left foot on 11/27.  PMH includes: morbid obesity, lymphedema, HTN, dyspnea, basal cell cancer, anxiety, acute myocardial infarction,  CABG,     PT Comments    Patient is making gradual progress toward mobility goals and able to maintain NWB L LE during stand pivot transfer. Pt did require assistance to stand/sit and maintain weight bearing status.  CAM boot in room does not fit properly due to bulky bandaging on L LE. Current plan remains appropriate.   Follow Up Recommendations  SNF;DC plan and follow up therapy as arranged by surgeon;Supervision/Assistance - 24 hour     Equipment Recommendations  None recommended by PT    Recommendations for Other Services OT consult     Precautions / Restrictions Precautions Precautions: Fall Precaution Comments: NWB LLE, weakness, obeisty Restrictions Weight Bearing Restrictions: Yes LLE Weight Bearing: Non weight bearing    Mobility  Bed Mobility Overal bed mobility: Modified Independent Bed Mobility: Supine to Sit           General bed mobility comments: use of rails and HOB elevated  Transfers Overall transfer level: Needs assistance Equipment used: Rolling walker (2 wheeled) Transfers: Sit to/from [facility] Sit to Stand: Mod assist;+2 physical assistance Stand pivot transfers: Min assist;+2 safety/equipment       General transfer comment: cues for safe hand placement and technique; assist to power up into standing and to maintain NWB when standing/sitting; pt able to maintian weight bearing status while pivoting but did require assist to steady and for management of RW  Ambulation/Gait             General Gait Details: deferred due to difficulty maintaining weight bearing status and  CAM boot not fitting proprerly   Stairs            Wheelchair Mobility    Modified Rankin (Stroke Patients Only)       Balance Overall balance assessment: Needs assistance   Sitting balance-Leahy Scale: Good     Standing balance support: During functional activity;Bilateral upper extremity supported Standing balance-Leahy Scale: Poor                              Cognition Arousal/Alertness: Awake/alert Behavior During Therapy: WFL for tasks assessed/performed Overall Cognitive Status: Within Functional Limits for tasks assessed                                        Exercises General Exercises - Upper Extremity Chair Push Up: AROM;5 reps General Exercises - Lower Extremity Ankle Circles/Pumps: AROM;Right;10 reps Long Arc Quad: AROM;Both;10 reps;Seated Hip Flexion/Marching: AROM;Both;10 reps;Seated    General Comments        Pertinent Vitals/Pain Pain Assessment: Faces Faces Pain Scale: Hurts a little bit Pain Location: L FOOT Pain Descriptors / Indicators: Discomfort Pain Intervention(s): Monitored during session;Repositioned    Home Living                      Prior Function            PT Goals (current goals can now be found in the care plan section) Acute Rehab PT Goals Patient Stated Goal:  to go to SNF PT Goal Formulation: With patient Time For Goal Achievement: 02/17/17 Potential to Achieve Goals: Good Progress towards PT goals: Progressing toward goals    Frequency    Min 3X/week      PT Plan Current plan remains appropriate    Co-evaluation              AM-PAC PT "6 Clicks" Daily Activity  Outcome Measure  Difficulty turning over in bed (including adjusting bedclothes, sheets and blankets)?: A Little Difficulty moving from lying on back to sitting on the side of the bed? : A Little Difficulty sitting down on and standing up from a chair with arms (e.g., wheelchair, bedside commode,  etc,.)?: Unable Help needed moving to and from a bed to chair (including a wheelchair)?: Total Help needed walking in hospital room?: Total Help needed climbing 3-5 steps with a railing? : Total 6 Click Score: 10    End of Session Equipment Utilized During Treatment: Gait belt Activity Tolerance: Patient tolerated treatment well Patient left: in chair;with call bell/phone within reach;with family/visitor present Nurse Communication: Mobility status PT Visit Diagnosis: Unsteadiness on feet (R26.81);Other abnormalities of gait and mobility (R26.89);Repeated falls (R29.6);Muscle weakness (generalized) (M62.81);Pain Pain - Right/Left: Left Pain - part of body: Ankle and joints of foot     Time: 0927-0950 PT Time Calculation (min) (ACUTE ONLY): 23 min  Charges:  $Therapeutic Activity: 23-37 mins                    G Codes:       Earney Navy, PTA Pager: [phone]     Darliss Cheney 02/13/2017, 10:17 AM

## 2017-02-13 NOTE — Care Management Note (Addendum)
Case Management Note  Patient Details  Name: MAKYNLIE ROSSINI MRN: [id number] Date of Birth: 08-25-1955  Subjective/Objective:   excision talar head, internal fixation medial column left foot                Action/Plan: Discharge Planning: Chart reviewed. Pt wants SNF rehab. CSW following for SNF placement  PCP  [name] MD   Expected Discharge Date:  02/14/2017              Expected Discharge Plan:  [place]  In-House Referral:  Clinical Social Work  Discharge planning Services  CM Consult  Post Acute Care Choice:  NA Choice offered to:  NA  DME Arranged:  N/A DME Agency:  NA  HH Arranged:  NA HH Agency:  NA  Status of Service:  Completed, signed off  If discussed at [employer] of Stay Meetings, dates discussed:    Additional Comments:  Erenest Rasher, RN 02/13/2017, 5:15 PM

## 2017-02-14 ENCOUNTER — Inpatient Hospital Stay (INDEPENDENT_AMBULATORY_CARE_PROVIDER_SITE_OTHER): Payer: Medicare Other | Admitting: Orthopedic Surgery

## 2017-02-14 DIAGNOSIS — E11610 Type 2 diabetes mellitus with diabetic neuropathic arthropathy: Secondary | ICD-10-CM | POA: Diagnosis not present

## 2017-02-14 DIAGNOSIS — G8911 Acute pain due to trauma: Secondary | ICD-10-CM | POA: Diagnosis not present

## 2017-02-14 DIAGNOSIS — S92909A Unspecified fracture of unspecified foot, initial encounter for closed fracture: Secondary | ICD-10-CM | POA: Diagnosis not present

## 2017-02-14 DIAGNOSIS — L89621 Pressure ulcer of left heel, stage 1: Secondary | ICD-10-CM | POA: Diagnosis not present

## 2017-02-14 DIAGNOSIS — R2681 Unsteadiness on feet: Secondary | ICD-10-CM | POA: Diagnosis not present

## 2017-02-14 DIAGNOSIS — S91302A Unspecified open wound, left foot, initial encounter: Secondary | ICD-10-CM | POA: Diagnosis not present

## 2017-02-14 DIAGNOSIS — Z951 Presence of aortocoronary bypass graft: Secondary | ICD-10-CM | POA: Diagnosis not present

## 2017-02-14 DIAGNOSIS — Z4789 Encounter for other orthopedic aftercare: Secondary | ICD-10-CM | POA: Diagnosis not present

## 2017-02-14 DIAGNOSIS — T8189XA Other complications of procedures, not elsewhere classified, initial encounter: Secondary | ICD-10-CM | POA: Diagnosis not present

## 2017-02-14 DIAGNOSIS — I119 Hypertensive heart disease without heart failure: Secondary | ICD-10-CM | POA: Diagnosis not present

## 2017-02-14 DIAGNOSIS — I2510 Atherosclerotic heart disease of native coronary artery without angina pectoris: Secondary | ICD-10-CM | POA: Diagnosis not present

## 2017-02-14 DIAGNOSIS — E785 Hyperlipidemia, unspecified: Secondary | ICD-10-CM | POA: Diagnosis not present

## 2017-02-14 DIAGNOSIS — M79672 Pain in left foot: Secondary | ICD-10-CM | POA: Diagnosis not present

## 2017-02-14 DIAGNOSIS — I252 Old myocardial infarction: Secondary | ICD-10-CM | POA: Diagnosis not present

## 2017-02-14 MED ORDER — HYDROCODONE-ACETAMINOPHEN 5-325 MG PO TABS: 1.0000 | ORAL_TABLET | ORAL | 0 refills | Status: DC | PRN

## 2017-02-14 NOTE — Discharge Summary (Signed)
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 female who was admitted 02/09/2017 with a chief complaint of open charcot collapse left foot, with a final diagnosis of Osteomyelitis Left Talar Head, Charcot Foot.  Patient was brought to the operating room on 02/09/2017 and underwent Procedure(s): EXCISION TALAR HEAD, INTERNAL FIXATION MEDIAL COLUMN LEFT FOOT.    Patient was given perioperative antibiotics:  Anti-infectives (From admission, onward)   Start     Dose/Rate Route Frequency Ordered Stop   02/09/17 1730  ceFAZolin (ANCEF) IVPB 2g/100 mL premix     2 g 200 mL/hr over 30 Minutes Intravenous Every 6 hours 02/09/17 1411 02/10/17 0559   02/09/17 0600  ceFAZolin (ANCEF) 3 g in dextrose 5 % 50 mL IVPB     3 g 130 mL/hr over 30 Minutes Intravenous On call to O.R. 02/08/17 0848 02/09/17 1134    .  Patient was given sequential compression devices, early ambulation, and aspirin for DVT prophylaxis.  Recent vital signs:  Patient Vitals for the past 24 hrs:  BP Temp Temp src Pulse Resp SpO2 Height  02/14/17 0440 (!) 132/36 99 F (37.2 C) Oral (!) 52 16 93 % -  02/13/17 2258 (!) 127/40 99.4 F (37.4 C) Oral (!) 55 17 92 % -  02/13/17 1410 (!) 122/55 98.1 F (36.7 C) Oral (!) 58 18 97 % -  02/13/17 1101 - - - 62 - 96 % -  02/13/17 1056 (!) 154/45 - - - - - -  02/13/17 0853 - - - - 18 - 5\' 6"  (1.676 m)  .  Recent laboratory studies: No results found.  Discharge Medications:   Allergies as of 02/14/2017      Reactions   Ace Inhibitors Cough      Medication List    STOP taking these medications   amoxicillin-clavulanate 875-125 MG tablet Commonly known as:   AUGMENTIN   diclofenac 75 MG EC tablet Commonly known as:  VOLTAREN   doxycycline 100 MG tablet Commonly known as:  VIBRA-TABS   ibuprofen 200 MG tablet Commonly known as:  ADVIL,MOTRIN   meloxicam 15 MG tablet Commonly known as:  MOBIC     TAKE these medications   aspirin 81 MG tablet Take 81 mg by mouth daily.   fenofibrate 160 MG tablet TAKE 1 TABLET BY MOUTH  DAILY FOR CHOLESTEROL AND  TRIGLYCERIDES What changed:    how much to take  how to take this  when to take this  additional instructions   furosemide 20 MG tablet Commonly known as:  LASIX TAKE 1 TABLET BY MOUTH  DAILY   HYDROcodone-acetaminophen 5-325 MG tablet Commonly known as:  NORCO/VICODIN Take 1 tablet by mouth every 4 (four) hours as needed for moderate pain or severe pain ((score 4 to 6)).   metoprolol tartrate 25 MG tablet Commonly known as:  LOPRESSOR Take 0.5 tablets (12.5 mg total) by mouth 2 (two) times daily.   multivitamin tablet Take 1 tablet by mouth daily.   nitroGLYCERIN 0.4 MG SL tablet Commonly known as:  NITROSTAT Place 1 tablet (0.4 mg total) under the tongue every 5 (five) minutes x 3 doses as needed for chest pain. If pain  persist after call 911   traZODone 150 MG tablet Commonly known as:  DESYREL TAKE 1 OR 2 TABLETS BY  MOUTH AT BEDTIME AS NEEDED  FOR SLEEP   valsartan-hydrochlorothiazide 160-12.5 MG tablet Commonly known as:  DIOVAN-HCT Take 1 tablet by mouth daily.            Discharge Care Instructions  (From admission, onward)        Start     Ordered   02/14/17 0000  Non weight bearing    Question Answer Comment  Laterality left   Extremity Lower      02/14/17 0607      Diagnostic Studies: Dg Ankle 2 Views Left  Result Date: 01/30/2017 CLINICAL DATA:  61 year old female with skin wound over the lateral malleolus. Redness and swelling and drainage from the wound. EXAM: LEFT ANKLE - 2 VIEW COMPARISON:  Left foot radiograph dated 07/01/2016  FINDINGS: Evaluation of the ankle is limited due to suboptimal positioning of the foot. There is diffuse soft tissue swelling of the ankle. There is a small amorphous calcific density in the soft tissues over the lateral malleolus which may represent a displaced avulsed fracture. No other acute fracture or dislocation identified. The bones are osteopenic. There is degenerative changes at the ankle joint as well as degenerative changes of the tarsal joints with bone spurring. There is also soft tissue swelling over the medial hindfoot with a 7 mm deep skin ulcer with overlying dressing. There is pes planus. IMPRESSION: 1. Diffuse soft tissue swelling of the ankle. A small amorphous calcific density in the soft tissues of the lateral aspect of the ankle may represent an avulsed fracture possibly from the tip of the lateral malleolus. Evaluation is limited on the provided images due to suboptimal positioning of the foot. 2. Soft tissue swelling and skin ulcer over the medial aspect of the hindfoot. 3. Osteopenia with degenerative changes of the ankle. Electronically Signed   By: Anner Crete M.D.   On: 01/30/2017 18:47   Xr Foot Complete Left  Result Date: 01/31/2017 3 view radiographs of the left foot shows destructive changes of the talar head consistent with osteomyelitis.  This communicates with the ulcer with subcutaneous air.   Patient benefited maximally from their hospital stay and there were no complications.     Disposition: 01-Home or Self Care Discharge Instructions    Call MD / Call 911   Complete by:  As directed    If you experience chest pain or shortness of breath, CALL 911 and be transported to the hospital emergency room.  If you develope a fever above 101 F, pus (white drainage) or increased drainage or redness at the wound, or calf pain, call your surgeon's office.   Constipation Prevention   Complete by:  As directed    Drink plenty of fluids.  Prune juice may be helpful.   You may use a stool softener, such as Colace (over the counter) 100 mg twice a day.  Use MiraLax (over the counter) for constipation as needed.   Diet - low sodium heart healthy   Complete by:  As directed    Increase activity slowly as tolerated   Complete by:  As directed    Non weight bearing   Complete by:  As directed    Laterality:  left   Extremity:  Lower   Post-op shoe   Complete by:  As directed       Contact information for follow-up providers  Newt Minion, MD Follow up in 1 week(s).   Specialty:  Orthopedic Surgery Contact information: [place] [address] [phone]            Contact information for after-discharge care    Destination    [facility] Follow up.   Service:  Skilled Nursing Contact information: [address] [place] [phone]                   Signed: Newt Minion 02/14/2017, 6:07 AM

## 2017-02-14 NOTE — Care Management Important Message (Signed)
Important Message  Patient Details  Name: Julia West MRN: [id number] Date of Birth: 02/02/1956   Medicare Important Message Given:  Yes    Erenest Rasher, RN 02/14/2017, 10:15 AM

## 2017-02-14 NOTE — Clinical Social Work Placement (Signed)
   CLINICAL SOCIAL WORK PLACEMENT  NOTE  Date:  02/14/2017  Patient Details  Name: Julia West MRN: [id number] Date of Birth: 04-03-1955  Clinical Social Work is seeking post-discharge placement for this patient at the [place] level of care (*CSW will initial, date and re-position this form in  chart as items are completed):  Yes   Patient/family provided with [facility] Work Department's list of facilities offering this level of care within the geographic area requested by the patient (or if unable, by the patient's family).  Yes   Patient/family informed of their freedom to choose among providers that offer the needed level of care, that participate in Medicare, Medicaid or managed care program needed by the patient, have an available bed and are willing to accept the patient.  Yes   Patient/family informed of [facility] ownership interest in [facility] and [facility], as well as of the fact that they are under no obligation to receive care at these facilities.  PASRR submitted to EDS on       PASRR number received on 02/10/17     Existing PASRR number confirmed on       FL2 transmitted to all facilities in geographic area requested by pt/family on 02/10/17     FL2 transmitted to all facilities within larger geographic area on       Patient informed that his/her managed care company has contracts with or will negotiate with certain facilities, including the following:        Yes   Patient/family informed of bed offers received.  Patient chooses bed at [facility]     Physician recommends and patient chooses bed at      Patient to be transferred to [facility] on 02/14/17.  Patient to be transferred to facility by [facility]     Patient family notified on 02/14/17 of transfer.  Name of family member notified:  pt responsible for self and CSW contacted spouse     PHYSICIAN       Additional Comment:     _______________________________________________ Normajean Baxter, LCSW 02/14/2017, 10:02 AM

## 2017-02-14 NOTE — Social Work (Signed)
Clinical Social Worker facilitated patient discharge including contacting patient family and facility to confirm patient discharge plans.  Clinical information faxed to facility and family agreeable with plan.    CSW arranged ambulance transport via [place] to [facility] .    RN to call [phone] report prior to discharge.  Clinical Social Worker will sign off for now as social work intervention is no longer needed. Please consult [place] again if new need arises.  [name], LCSW Clinical Social Worker [phone]

## 2017-02-15 ENCOUNTER — Other Ambulatory Visit (INDEPENDENT_AMBULATORY_CARE_PROVIDER_SITE_OTHER): Payer: Self-pay

## 2017-02-15 ENCOUNTER — Telehealth (INDEPENDENT_AMBULATORY_CARE_PROVIDER_SITE_OTHER): Payer: Self-pay | Admitting: Orthopedic Surgery

## 2017-02-15 DIAGNOSIS — Z4789 Encounter for other orthopedic aftercare: Secondary | ICD-10-CM | POA: Diagnosis not present

## 2017-02-15 DIAGNOSIS — Z951 Presence of aortocoronary bypass graft: Secondary | ICD-10-CM | POA: Diagnosis not present

## 2017-02-15 DIAGNOSIS — I119 Hypertensive heart disease without heart failure: Secondary | ICD-10-CM | POA: Diagnosis not present

## 2017-02-15 DIAGNOSIS — I2510 Atherosclerotic heart disease of native coronary artery without angina pectoris: Secondary | ICD-10-CM | POA: Diagnosis not present

## 2017-02-15 NOTE — Telephone Encounter (Signed)
I called and sw pts daughter. A bariatric walker was ordered for pt height and weight included in order to facility and advised that she is to be non weight bearing due to her recent surgery. Also per request order written for motorized wheelchair rental per daughters request. When the pt d/c home she would like for her to be independent and safe and said that she will work on the coverage with insurance and the rental company. She asked that this be included on the order as well. This was faxed to the facility as requested [phone]. also assured that the dressing can remain in tact form the surgery until her post op appt as long as the dressing is not soiled. If it does have drainage then nursing can remove and change daily but if nothing is noted ok to leave until appt with erin. To call with any additional questions.

## 2017-02-15 NOTE — Telephone Encounter (Signed)
Patients daughter, [name] called and said that nursing home does not have correct (larger) walker for patient since she is overweight. She stated that patient was not supposed to put any weight on her left foot. Can we get an order for a new walker for the facility? Also, she had a question about follow up for her foot as far as it being unwrapped. Can you call the daughter back to advise? # [phone]

## 2017-02-16 ENCOUNTER — Telehealth: Payer: Self-pay | Admitting: Family Medicine

## 2017-02-16 ENCOUNTER — Telehealth (INDEPENDENT_AMBULATORY_CARE_PROVIDER_SITE_OTHER): Payer: Self-pay

## 2017-02-16 NOTE — Telephone Encounter (Signed)
I also received a voicemail from [employer] at [facility] making sure it was ok to use bariatric walker, she wanted to confirm this wasn't suppose to be a wheelchair.  I called and left vm saying walker is ok for walker.

## 2017-02-16 NOTE — Telephone Encounter (Signed)
Patient is at [facility], patient daughter [name] [phone] calling stating they aren't using a larger wheelchair stating the hospital didn't give them that information. She states that only ONE person is helping her mom get around and she states Sharol Given specifically said NON weightbearing--she is worried this ONE person couldn't catch her if she fell. She states they are telling her they didn't get any specific orders from [place] and are begging [place] to call and give them some

## 2017-02-24 ENCOUNTER — Encounter (INDEPENDENT_AMBULATORY_CARE_PROVIDER_SITE_OTHER): Payer: Self-pay | Admitting: Family

## 2017-02-24 ENCOUNTER — Ambulatory Visit (INDEPENDENT_AMBULATORY_CARE_PROVIDER_SITE_OTHER): Payer: Medicare Other | Admitting: Family

## 2017-02-24 ENCOUNTER — Ambulatory Visit (INDEPENDENT_AMBULATORY_CARE_PROVIDER_SITE_OTHER): Payer: Medicare Other

## 2017-02-24 VITALS — Ht 66.0 in | Wt 280.0 lb

## 2017-02-24 DIAGNOSIS — I119 Hypertensive heart disease without heart failure: Secondary | ICD-10-CM | POA: Diagnosis not present

## 2017-02-24 DIAGNOSIS — Z4789 Encounter for other orthopedic aftercare: Secondary | ICD-10-CM | POA: Diagnosis not present

## 2017-02-24 DIAGNOSIS — E11610 Type 2 diabetes mellitus with diabetic neuropathic arthropathy: Secondary | ICD-10-CM | POA: Diagnosis not present

## 2017-02-24 DIAGNOSIS — I2510 Atherosclerotic heart disease of native coronary artery without angina pectoris: Secondary | ICD-10-CM | POA: Diagnosis not present

## 2017-02-24 DIAGNOSIS — M79672 Pain in left foot: Secondary | ICD-10-CM

## 2017-02-24 MED ORDER — DOXYCYCLINE HYCLATE 100 MG PO TABS: 100.0000 mg | ORAL_TABLET | Freq: Two times a day (BID) | ORAL | 0 refills | Status: DC

## 2017-02-24 NOTE — Progress Notes (Signed)
Post-Op Visit Note   Patient: Julia West           Date of Birth: 02-12-1956           MRN: [id number] Visit Date: 02/24/2017 PCP: [name], MD  Chief Complaint:  Chief Complaint  Patient presents with  . Left Foot - Routine Post Op    02/09/17 left foot internal fixation medial column    HPI:  HPI The patient is a 61 year old woman who presents today nearly 2 weeks status post left foot ORIF of the medial column for Charcot collapse.  Does have a history of lymphedema.  She is residing at skilled nursing for her rehabilitation.  Has swelling and ulceration along her incision today.  States has pain when wearing her cam boot this is too tight.  Has been having dry dressing changes for the last week.  Complains of burning pain to right posterolateral heel. Having issues with foot resting on heel, pressure.   Ortho Exam Examination of her incision incision well approximated with sutures there is no gaping.  Does have maceration and ulceration scattered along the incision there is weeping serous drainage from her incision.  Pitting edema up to the tibial tubercle.  No cellulitis.  Mild erythema surrounding incision.  No odor no purulence.  Visit Diagnoses:  1. Charcot foot due to diabetes mellitus ([place])   2. Pain in left foot   3. 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  Have provided a peripheral boot to offload her heel will provide a properly fitting Cam boot as well.  She will continue nonweightbearing.  Follow-Up Instructions: Return in about 1 week (around 03/03/2017).   Imaging: Xr Foot Complete Left  Result Date: 02/24/2017 Radiographs of left foot show stable internal fixation of medial column. No hardware failure.    Orders:  Orders Placed This Encounter  Procedures  . XR Foot Complete Left   Meds ordered this encounter  Medications  . doxycycline (VIBRA-TABS)  100 MG tablet    Sig: Take 1 tablet (100 mg total) by mouth 2 (two) times daily.    Dispense:  28 tablet    Refill:  0     PMFS History: Patient Active Problem List   Diagnosis Date Noted  . Charcot foot due to diabetes mellitus ([place]) 02/09/2017  . 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 Post-menopausal bleeding 04/15/2014  . Endometrial polyp 04/09/2014  . Ventral hernia 03/06/2012  . Open abdominal wall wound 03/06/2012  . Hyperlipemia 11/12/2008  . OBESITY, MORBID 11/12/2008  . Essential hypertension 11/12/2008  . CAD 11/12/2008   Past Medical History:  Diagnosis Date  . Acute myocardial infarction    with a ruptured plaque in the circumflex in 2003  . Anxiety   . Cancer ([place])    basal cell face  . Complication of anesthesia    states low O2 sats post-op 11/13, always slow to awaken  . Coronary artery disease   . Degenerative joint disease   . Dyspnea    with activity- car to house - steps  . Heart murmur    "not to be concerned"  . HTN (hypertension)   . Hyperlipidemia LDL goal <70   . Internal and external hemorrhoids without complication   . Lymphedema    right side of body  . Lymphedema of arm  right  . Morbid obesity ([place])   . Surgical wound, non healing ABDOMINAL    has wound vac @ 125 mm Hg    Family History  Problem Relation Age of Onset  . Heart attack Mother   . Hypertension Sister   . Diabetes Brother   . Birth defects Son        heart defect    Past Surgical History:  Procedure Laterality Date  . APPLICATION OF WOUND VAC    . BOWEL RESECTION  02/07/2012   Procedure: SMALL BOWEL RESECTION;  Surgeon: Gayland Curry, MD,FACS;  Location: [place];  Service: General;;  . CARDIOVASCULAR STRESS TEST  01-17-2012  DR HOCHREIN   LOW RISK NUCLEAR TEST  . CESAREAN SECTION     x 4 in remote past  . CHOLECYSTECTOMY    . CORONARY ARTERY BYPASS GRAFT  2003   by Dr. Servando Snare. LIMA to the LAD, free RIMA to the circumflex.  Stress perfusion study December 2009 with no high-risk areas of ischemia. She has a well-preserved ejection fraction  . HYSTEROSCOPY W/D&C N/A 05/14/2014   Procedure: DILATATION AND CURETTAGE (no specimen); HYSTEROSCOPY;  Surgeon: Jonnie Kind, MD;  Location: [facility];  Service: Gynecology;  Laterality: N/A;  . INCISION AND DRAINAGE OF WOUND  04/17/2012   Procedure: IRRIGATION AND DEBRIDEMENT WOUND;  Surgeon: Theodoro Kos, DO;  Location: [place];  Service: Plastics;  Laterality: N/A;  OF ABDOMINAL WOUND, SURGICAL PREP AND PLACEMENT OF VAC, REMOVAL FOREHEAD SKIN LESION  . INCISION AND DRAINAGE OF WOUND N/A 04/24/2012   Procedure: IRRIGATION AND DEBRIDEMENT WOUND;  Surgeon: Theodoro Kos, DO;  Location: [place];  Service: Plastics;  Laterality: N/A;  I & D ABDOMINAL WOUND WITH VAC AND ACELL  . INCISION AND DRAINAGE OF WOUND N/A 05/01/2012   Procedure: IRRIGATION AND DEBRIDEMENT WOUND;  Surgeon: Theodoro Kos, DO;  Location: [place];  Service: Plastics;  Laterality: N/A;  WITH SURGICAL PREP AND PLACEMENT OF VAC  . INCISION AND DRAINAGE OF WOUND N/A 05/08/2012   Procedure: IRRIGATION AND DEBRIDEMENT OF ABD WOUND SURGICAL PREP AND PLACEMENT OF VAC ;  Surgeon: Theodoro Kos, DO;  Location: [place];  Service: Plastics;  Laterality: N/A;  IRRIGATION AND DEBRIDEMENT OF ABD WOUND SURGICAL PREP AND PLACEMENT OF VAC   . INCISION AND DRAINAGE OF WOUND N/A 05/15/2012   Procedure: IRRIGATION AND DEBRIDEMENT OF ABDOMINAL WOUND WITH POSSIBLE SURGICAL PREP AND PLACEMENT OF VAC;  Surgeon: Theodoro Kos, DO;  Location: [place];  Service: Plastics;  Laterality: N/A;  . INCISION AND DRAINAGE OF WOUND N/A 05/22/2012   Procedure: IRRIGATION AND DEBRIDEMENT OF ABODOMINAL WOUND WITH  SURGICAL PREP AND VAC PLACEMENT;  Surgeon: Theodoro Kos, DO;  Location: [place];  Service: Plastics;  Laterality: N/A;  . INCISION AND  DRAINAGE OF WOUND N/A 06/28/2012   Procedure: IRRIGATION AND DEBRIDEMENT OF ABDOMINAL ULCER SURGICAL PREP AND PLACEMENT OF ACELL AND VAC;  Surgeon: Theodoro Kos, DO;  Location: [place];  Service: Plastics;  Laterality: N/A;  . INCISIONAL HERNIA REPAIR  02/07/2012   Procedure: HERNIA REPAIR INCISIONAL;  Surgeon: Gayland Curry, MD,FACS;  Location: [place];  Service: General;;  Open, Primary repair, strangulated Incisional hernia.  Marland Kitchen LESION REMOVAL  04/17/2012   Procedure: LESION REMOVAL;  Surgeon: Theodoro Kos, DO;  Location: [place];  Service: Plastics;  Laterality: N/A;   CENTER OF FOREHEAD, REMOVAL FORHEAD SKIN LESION  .  ORIF TOE FRACTURE Left 02/09/2017   Procedure: EXCISION TALAR HEAD, INTERNAL FIXATION MEDIAL COLUMN LEFT FOOT;  Surgeon: Newt Minion, MD;  Location: [place];  Service: Orthopedics;  Laterality: Left;  . POLYPECTOMY N/A 05/14/2014   Procedure: ENDOMETRIAL POLYPECTOMY;  Surgeon: Jonnie Kind, MD;  Location: [facility];  Service: Gynecology;  Laterality: N/A;   Social History   Occupational History  . Occupation: Estate manager/land agent    Comment: In the [employer]  Tobacco Use  . Smoking status: Former Smoker    Packs/day: 1.00    Years: 25.00    Pack years: 25.00    Types: Cigarettes    Last attempt to quit: 11/17/2011    Years since quitting: 5.2  . Smokeless tobacco: Never Used  Substance and Sexual Activity  . Alcohol use: No  . Drug use: No  . Sexual activity: Yes    Birth control/protection: Post-menopausal

## 2017-02-28 ENCOUNTER — Encounter (INDEPENDENT_AMBULATORY_CARE_PROVIDER_SITE_OTHER): Payer: Self-pay | Admitting: Orthopedic Surgery

## 2017-02-28 ENCOUNTER — Ambulatory Visit (INDEPENDENT_AMBULATORY_CARE_PROVIDER_SITE_OTHER): Payer: Medicare Other | Admitting: Orthopedic Surgery

## 2017-02-28 DIAGNOSIS — L89621 Pressure ulcer of left heel, stage 1: Secondary | ICD-10-CM

## 2017-02-28 DIAGNOSIS — E11610 Type 2 diabetes mellitus with diabetic neuropathic arthropathy: Secondary | ICD-10-CM

## 2017-02-28 NOTE — Progress Notes (Signed)
Office Visit Note   Patient: Julia West           Date of Birth: Jul 16, 1955           MRN: [id number] Visit Date: 02/28/2017              Requested by: [name], MD [place], [place] [address] PCP: [name], MD  Chief Complaint  Patient presents with  . Left Foot - Pain      HPI: Patient is a 61 year old woman who is status post open reduction internal fixation for Charcot collapse of the left foot.  She has been at skilled nursing the wrap was removed and no new compression was applied at skilled nursing.  Patient states she has been wearing the [facility] but has developed a decubitus heel ulcer.  Assessment & Plan: Visit Diagnoses:  1. Charcot foot due to diabetes mellitus ([place])   2. Decubitus ulcer of left heel, stage 1     Plan: The photographs from the foot and ankle after the compression wrap was initially removed compared to at this time she has significant increased swelling in the left lower extremity.  We will need to have her continue using the compression wraps or use compression socks.  Patient was discussed that she would like to continue with the compression wraps.  These need to stay in place until follow-up.  She is to have her heel floated off the bed so there is no pressure on her heel at any time.  She may discontinue the PRAFO if the heel was properly floated off the bed.  Patient was given orders to elevate the foot above the heart strict nonweightbearing on the left lower extremity float the heel off the bed she was given a note for physical therapy as well as occupational therapy and if necessary.  Follow-Up Instructions: Return in about 1 week (around 03/07/2017).   Ortho Exam  Patient is alert, oriented, no adenopathy, well-dressed, normal affect, normal respiratory effort. Examination patient has developed a new superficial decubitus heel ulcer on the left is approximately 3 cm in diameter and is superficial and dark in color  there is no cellulitis no drainage no full-thickness skin defect.  Examination of her foot she does have increased swelling there is no cellulitis no odor she has a very small amount of serosanguineous drainage the remainder of the surgical incision is well-healed.  Imaging: No results found. No images are attached to the encounter.  Labs: Lab Results  Component Value Date   HGBA1C 5.6 02/07/2017   REPTSTATUS 04/29/2012 FINAL 04/24/2012   REPTSTATUS 04/27/2012 FINAL 04/24/2012   GRAMSTAIN  04/24/2012    NO WBC SEEN NO SQUAMOUS EPITHELIAL CELLS SEEN MODERATE GRAM POSITIVE COCCI IN PAIRS RARE GRAM NEGATIVE RODS   GRAMSTAIN  04/24/2012    NO WBC SEEN NO SQUAMOUS EPITHELIAL CELLS SEEN MODERATE GRAM POSITIVE COCCI IN PAIRS RARE GRAM NEGATIVE RODS   CULT NO ANAEROBES ISOLATED 04/24/2012   CULT MODERATE PROTEUS MIRABILIS 04/24/2012   LABORGA PROTEUS MIRABILIS 04/24/2012    @LABSALLVALUES (HGBA1)@  There is no height or weight on file to calculate BMI.  Orders:  No orders of the defined types were placed in this encounter.  No orders of the defined types were placed in this encounter.    Procedures: No procedures performed  Clinical Data: No additional findings.  ROS:  All other systems negative, except as noted in the HPI. Review of Systems  Objective: Vital Signs:  There were no vitals taken for this visit.  Specialty Comments:  No specialty comments available.  PMFS History: Patient Active Problem List   Diagnosis Date Noted  . Charcot foot due to diabetes mellitus ([place]) 02/09/2017  . 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 Post-menopausal bleeding 04/15/2014  . Endometrial polyp 04/09/2014  . Ventral hernia 03/06/2012  . Open abdominal wall wound 03/06/2012  . Hyperlipemia 11/12/2008  . OBESITY, MORBID 11/12/2008  . Essential hypertension 11/12/2008  . CAD 11/12/2008   Past Medical History:  Diagnosis Date  . Acute  myocardial infarction    with a ruptured plaque in the circumflex in 2003  . Anxiety   . Cancer ([place])    basal cell face  . Complication of anesthesia    states low O2 sats post-op 11/13, always slow to awaken  . Coronary artery disease   . Degenerative joint disease   . Dyspnea    with activity- car to house - steps  . Heart murmur    "not to be concerned"  . HTN (hypertension)   . Hyperlipidemia LDL goal <70   . Internal and external hemorrhoids without complication   . Lymphedema    right side of body  . Lymphedema of arm    right  . Morbid obesity ([place])   . Surgical wound, non healing ABDOMINAL    has wound vac @ 125 mm Hg    Family History  Problem Relation Age of Onset  . Heart attack Mother   . Hypertension Sister   . Diabetes Brother   . Birth defects Son        heart defect    Past Surgical History:  Procedure Laterality Date  . APPLICATION OF WOUND VAC    . BOWEL RESECTION  02/07/2012   Procedure: SMALL BOWEL RESECTION;  Surgeon: Gayland Curry, MD,FACS;  Location: [place];  Service: General;;  . CARDIOVASCULAR STRESS TEST  01-17-2012  DR HOCHREIN   LOW RISK NUCLEAR TEST  . CESAREAN SECTION     x 4 in remote past  . CHOLECYSTECTOMY    . CORONARY ARTERY BYPASS GRAFT  2003   by Dr. Servando Snare. LIMA to the LAD, free RIMA to the circumflex. Stress perfusion study December 2009 with no high-risk areas of ischemia. She has a well-preserved ejection fraction  . HYSTEROSCOPY W/D&C N/A 05/14/2014   Procedure: DILATATION AND CURETTAGE (no specimen); HYSTEROSCOPY;  Surgeon: Jonnie Kind, MD;  Location: [facility];  Service: Gynecology;  Laterality: N/A;  . INCISION AND DRAINAGE OF WOUND  04/17/2012   Procedure: IRRIGATION AND DEBRIDEMENT WOUND;  Surgeon: Theodoro Kos, DO;  Location: [place];  Service: Plastics;  Laterality: N/A;  OF ABDOMINAL WOUND, SURGICAL PREP AND PLACEMENT OF VAC, REMOVAL FOREHEAD SKIN LESION  . INCISION AND DRAINAGE OF WOUND N/A 04/24/2012    Procedure: IRRIGATION AND DEBRIDEMENT WOUND;  Surgeon: Theodoro Kos, DO;  Location: [place];  Service: Plastics;  Laterality: N/A;  I & D ABDOMINAL WOUND WITH VAC AND ACELL  . INCISION AND DRAINAGE OF WOUND N/A 05/01/2012   Procedure: IRRIGATION AND DEBRIDEMENT WOUND;  Surgeon: Theodoro Kos, DO;  Location: [place];  Service: Plastics;  Laterality: N/A;  WITH SURGICAL PREP AND PLACEMENT OF VAC  . INCISION AND DRAINAGE OF WOUND N/A 05/08/2012   Procedure: IRRIGATION AND DEBRIDEMENT OF ABD WOUND SURGICAL PREP AND PLACEMENT OF VAC ;  Surgeon: Theodoro Kos, DO;  Location: [place];  Service: Plastics;  Laterality: N/A;  IRRIGATION AND DEBRIDEMENT OF ABD WOUND SURGICAL PREP AND PLACEMENT OF VAC   . INCISION AND DRAINAGE OF WOUND N/A 05/15/2012   Procedure: IRRIGATION AND DEBRIDEMENT OF ABDOMINAL WOUND WITH POSSIBLE SURGICAL PREP AND PLACEMENT OF VAC;  Surgeon: Theodoro Kos, DO;  Location: [place];  Service: Plastics;  Laterality: N/A;  . INCISION AND DRAINAGE OF WOUND N/A 05/22/2012   Procedure: IRRIGATION AND DEBRIDEMENT OF ABODOMINAL WOUND WITH  SURGICAL PREP AND VAC PLACEMENT;  Surgeon: Theodoro Kos, DO;  Location: [place];  Service: Plastics;  Laterality: N/A;  . INCISION AND DRAINAGE OF WOUND N/A 06/28/2012   Procedure: IRRIGATION AND DEBRIDEMENT OF ABDOMINAL ULCER SURGICAL PREP AND PLACEMENT OF ACELL AND VAC;  Surgeon: Theodoro Kos, DO;  Location: [place];  Service: Plastics;  Laterality: N/A;  . INCISIONAL HERNIA REPAIR  02/07/2012   Procedure: HERNIA REPAIR INCISIONAL;  Surgeon: Gayland Curry, MD,FACS;  Location: [place];  Service: General;;  Open, Primary repair, strangulated Incisional hernia.  Marland Kitchen LESION REMOVAL  04/17/2012   Procedure: LESION REMOVAL;  Surgeon: Theodoro Kos, DO;  Location: [place];  Service: Plastics;  Laterality: N/A;   CENTER OF FOREHEAD, REMOVAL  FORHEAD SKIN LESION  . ORIF TOE FRACTURE Left 02/09/2017   Procedure: EXCISION TALAR HEAD, INTERNAL FIXATION MEDIAL COLUMN LEFT FOOT;  Surgeon: Newt Minion, MD;  Location: [place];  Service: Orthopedics;  Laterality: Left;  . POLYPECTOMY N/A 05/14/2014   Procedure: ENDOMETRIAL POLYPECTOMY;  Surgeon: Jonnie Kind, MD;  Location: [facility];  Service: Gynecology;  Laterality: N/A;   Social History   Occupational History  . Occupation: Estate manager/land agent    Comment: In the [employer]  Tobacco Use  . Smoking status: Former Smoker    Packs/day: 1.00    Years: 25.00    Pack years: 25.00    Types: Cigarettes    Last attempt to quit: 11/17/2011    Years since quitting: 5.2  . Smokeless tobacco: Never Used  Substance and Sexual Activity  . Alcohol use: No  . Drug use: No  . Sexual activity: Yes    Birth control/protection: Post-menopausal

## 2017-03-03 ENCOUNTER — Ambulatory Visit (INDEPENDENT_AMBULATORY_CARE_PROVIDER_SITE_OTHER): Payer: Medicare Other | Admitting: Orthopedic Surgery

## 2017-03-03 ENCOUNTER — Telehealth (INDEPENDENT_AMBULATORY_CARE_PROVIDER_SITE_OTHER): Payer: Self-pay | Admitting: Orthopedic Surgery

## 2017-03-03 ENCOUNTER — Encounter (INDEPENDENT_AMBULATORY_CARE_PROVIDER_SITE_OTHER): Payer: Self-pay | Admitting: Orthopedic Surgery

## 2017-03-03 ENCOUNTER — Ambulatory Visit (INDEPENDENT_AMBULATORY_CARE_PROVIDER_SITE_OTHER): Payer: Medicare Other | Admitting: Family

## 2017-03-03 DIAGNOSIS — E11610 Type 2 diabetes mellitus with diabetic neuropathic arthropathy: Secondary | ICD-10-CM

## 2017-03-03 NOTE — Progress Notes (Signed)
Office Visit Note   Patient: Julia West           Date of Birth: 08-12-1955           MRN: [id number] Visit Date: 03/03/2017              Requested by: [name], MD [place], [place] [address] PCP: [name], MD  Chief Complaint  Patient presents with  . Left Foot - Follow-up    02/09/17 Excision Talar Head, internal fixation of medial column left foot.       HPI: Patient presents in follow-up status post reconstruction for Charcot collapse left foot.  Patient has been elevating her foot most of the time but has sat in a wheelchair with her leg dependent this caused increased swelling increased pain and because the top of the Dynaflex wrap to cut into her calf.  Assessment & Plan: Visit Diagnoses:  1. Charcot foot due to diabetes mellitus ([place])     Plan: Discussed the importance of strict elevation.  Discussed with without elevation she will have increased swelling she has potential for developing a wound proximal of the calf and also has a potential for wound dehiscence with potential for amputation with the wound dehiscence.  Patient and the family state they understand.  Patient will have the Dynaflex wrap change 3 times a week at skilled nursing.  She can work on therapy with her upper extremities still nonweightbearing on the left foot.  The family was interested in patient being admitted to the hospital to care for her foot and I stated that unless she had an acute medical condition such as pneumonia or urinary tract infection that admission to a hospital for bed rest and elevation of her foot would not be appropriate.  Discussed that the rehab facility does have wound care nurses and the medical director to care for her dressing changes and care for her medically.  Family is also inquiring regarding a different rehab facility or admission to the hospital rehab facility.  I discussed that a different rehab facility is their option and they could look into  another facility if they wish.  I do not feel that patient would be provided enough care and inpatient rehab to justify admission to inpatient rehab.  Reinforced again that without compliance with elevation and compression the risk of wound dehiscence is significant and the risk of a potential amputation is significant as well.  Follow-Up Instructions: Return in about 1 week (around 03/10/2017).   Ortho Exam  Patient is alert, oriented, no adenopathy, well-dressed, normal affect, normal respiratory effort. Examination patient now has wrinkling of the skin she is much improved from the previous exam the wound is approximately 90% healed with a small amount of serosanguineous drainage distally.  There is no redness no cellulitis no signs of insect infection.  Patient is developing some pressure areas on the heel and the importance of offloading her heel off the bed was discussed.  Imaging: No results found. No images are attached to the encounter.  Labs: Lab Results  Component Value Date   HGBA1C 5.6 02/07/2017   REPTSTATUS 04/29/2012 FINAL 04/24/2012   REPTSTATUS 04/27/2012 FINAL 04/24/2012   GRAMSTAIN  04/24/2012    NO WBC SEEN NO SQUAMOUS EPITHELIAL CELLS SEEN MODERATE GRAM POSITIVE COCCI IN PAIRS RARE GRAM NEGATIVE RODS   GRAMSTAIN  04/24/2012    NO WBC SEEN NO SQUAMOUS EPITHELIAL CELLS SEEN MODERATE GRAM POSITIVE COCCI IN PAIRS RARE GRAM  NEGATIVE RODS   CULT NO ANAEROBES ISOLATED 04/24/2012   CULT MODERATE PROTEUS MIRABILIS 04/24/2012   LABORGA PROTEUS MIRABILIS 04/24/2012    @LABSALLVALUES (HGBA1)@  There is no height or weight on file to calculate BMI.  Orders:  No orders of the defined types were placed in this encounter.  No orders of the defined types were placed in this encounter.    Procedures: No procedures performed  Clinical Data: No additional findings.  ROS:  All other systems negative, except as noted in the HPI. Review of  Systems  Objective: Vital Signs: There were no vitals taken for this visit.  Specialty Comments:  No specialty comments available.  PMFS History: Patient Active Problem List   Diagnosis Date Noted  . Charcot foot due to diabetes mellitus ([place]) 02/09/2017  . 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 Post-menopausal bleeding 04/15/2014  . Endometrial polyp 04/09/2014  . Ventral hernia 03/06/2012  . Open abdominal wall wound 03/06/2012  . Hyperlipemia 11/12/2008  . OBESITY, MORBID 11/12/2008  . Essential hypertension 11/12/2008  . CAD 11/12/2008   Past Medical History:  Diagnosis Date  . Acute myocardial infarction    with a ruptured plaque in the circumflex in 2003  . Anxiety   . Cancer ([place])    basal cell face  . Complication of anesthesia    states low O2 sats post-op 11/13, always slow to awaken  . Coronary artery disease   . Degenerative joint disease   . Dyspnea    with activity- car to house - steps  . Heart murmur    "not to be concerned"  . HTN (hypertension)   . Hyperlipidemia LDL goal <70   . Internal and external hemorrhoids without complication   . Lymphedema    right side of body  . Lymphedema of arm    right  . Morbid obesity ([place])   . Surgical wound, non healing ABDOMINAL    has wound vac @ 125 mm Hg    Family History  Problem Relation Age of Onset  . Heart attack Mother   . Hypertension Sister   . Diabetes Brother   . Birth defects Son        heart defect    Past Surgical History:  Procedure Laterality Date  . APPLICATION OF WOUND VAC    . BOWEL RESECTION  02/07/2012   Procedure: SMALL BOWEL RESECTION;  Surgeon: Gayland Curry, MD,FACS;  Location: [facility];  Service: General;;  . CARDIOVASCULAR STRESS TEST  01-17-2012  DR HOCHREIN   LOW RISK NUCLEAR TEST  . CESAREAN SECTION     x 4 in remote past  . CHOLECYSTECTOMY    . CORONARY ARTERY BYPASS GRAFT  2003   by Dr. Servando Snare. LIMA to the LAD, free RIMA to the  circumflex. Stress perfusion study December 2009 with no high-risk areas of ischemia. She has a well-preserved ejection fraction  . HYSTEROSCOPY W/D&C N/A 05/14/2014   Procedure: DILATATION AND CURETTAGE (no specimen); HYSTEROSCOPY;  Surgeon: Jonnie Kind, MD;  Location: [facility];  Service: Gynecology;  Laterality: N/A;  . INCISION AND DRAINAGE OF WOUND  04/17/2012   Procedure: IRRIGATION AND DEBRIDEMENT WOUND;  Surgeon: Theodoro Kos, DO;  Location: [facility];  Service: Plastics;  Laterality: N/A;  OF ABDOMINAL WOUND, SURGICAL PREP AND PLACEMENT OF VAC, REMOVAL FOREHEAD SKIN LESION  . INCISION AND DRAINAGE OF WOUND N/A 04/24/2012   Procedure: IRRIGATION AND DEBRIDEMENT WOUND;  Surgeon:  Claire Sanger, DO;  Location: [facility];  Service: Plastics;  Laterality: N/A;  I & D ABDOMINAL WOUND WITH VAC AND ACELL  . INCISION AND DRAINAGE OF WOUND N/A 05/01/2012   Procedure: IRRIGATION AND DEBRIDEMENT WOUND;  Surgeon: Theodoro Kos, DO;  Location: [facility];  Service: Plastics;  Laterality: N/A;  WITH SURGICAL PREP AND PLACEMENT OF VAC  . INCISION AND DRAINAGE OF WOUND N/A 05/08/2012   Procedure: IRRIGATION AND DEBRIDEMENT OF ABD WOUND SURGICAL PREP AND PLACEMENT OF VAC ;  Surgeon: Theodoro Kos, DO;  Location: [facility];  Service: Plastics;  Laterality: N/A;  IRRIGATION AND DEBRIDEMENT OF ABD WOUND SURGICAL PREP AND PLACEMENT OF VAC   . INCISION AND DRAINAGE OF WOUND N/A 05/15/2012   Procedure: IRRIGATION AND DEBRIDEMENT OF ABDOMINAL WOUND WITH POSSIBLE SURGICAL PREP AND PLACEMENT OF VAC;  Surgeon: Theodoro Kos, DO;  Location: [facility];  Service: Plastics;  Laterality: N/A;  . INCISION AND DRAINAGE OF WOUND N/A 05/22/2012   Procedure: IRRIGATION AND DEBRIDEMENT OF ABODOMINAL WOUND WITH  SURGICAL PREP AND VAC PLACEMENT;  Surgeon: Theodoro Kos, DO;  Location: [facility];  Service: Plastics;  Laterality: N/A;  .  INCISION AND DRAINAGE OF WOUND N/A 06/28/2012   Procedure: IRRIGATION AND DEBRIDEMENT OF ABDOMINAL ULCER SURGICAL PREP AND PLACEMENT OF ACELL AND VAC;  Surgeon: Theodoro Kos, DO;  Location: [facility];  Service: Plastics;  Laterality: N/A;  . INCISIONAL HERNIA REPAIR  02/07/2012   Procedure: HERNIA REPAIR INCISIONAL;  Surgeon: Gayland Curry, MD,FACS;  Location: [facility];  Service: General;;  Open, Primary repair, strangulated Incisional hernia.  Marland Kitchen LESION REMOVAL  04/17/2012   Procedure: LESION REMOVAL;  Surgeon: Theodoro Kos, DO;  Location: [facility];  Service: Plastics;  Laterality: N/A;   CENTER OF FOREHEAD, REMOVAL FORHEAD SKIN LESION  . ORIF TOE FRACTURE Left 02/09/2017   Procedure: EXCISION TALAR HEAD, INTERNAL FIXATION MEDIAL COLUMN LEFT FOOT;  Surgeon: Newt Minion, MD;  Location: [facility];  Service: Orthopedics;  Laterality: Left;  . POLYPECTOMY N/A 05/14/2014   Procedure: ENDOMETRIAL POLYPECTOMY;  Surgeon: Jonnie Kind, MD;  Location: [facility];  Service: Gynecology;  Laterality: N/A;   Social History   Occupational History  . Occupation: Estate manager/land agent    Comment: In the [employer]  Tobacco Use  . Smoking status: Former Smoker    Packs/day: 1.00    Years: 25.00    Pack years: 25.00    Types: Cigarettes    Last attempt to quit: 11/17/2011    Years since quitting: 5.2  . Smokeless tobacco: Never Used  Substance and Sexual Activity  . Alcohol use: No  . Drug use: No  . Sexual activity: Yes    Birth control/protection: Post-menopausal

## 2017-03-03 NOTE — Telephone Encounter (Signed)
error 

## 2017-03-10 ENCOUNTER — Ambulatory Visit (INDEPENDENT_AMBULATORY_CARE_PROVIDER_SITE_OTHER): Payer: Medicare Other | Admitting: Orthopaedic Surgery

## 2017-03-10 ENCOUNTER — Encounter (INDEPENDENT_AMBULATORY_CARE_PROVIDER_SITE_OTHER): Payer: Self-pay | Admitting: Orthopaedic Surgery

## 2017-03-10 DIAGNOSIS — E11610 Type 2 diabetes mellitus with diabetic neuropathic arthropathy: Secondary | ICD-10-CM

## 2017-03-10 MED ORDER — SULFAMETHOXAZOLE-TRIMETHOPRIM 800-160 MG PO TABS: 1.0000 | ORAL_TABLET | Freq: Two times a day (BID) | ORAL | 0 refills | Status: DC

## 2017-03-10 MED ORDER — MUPIROCIN 2 % EX OINT: 1.0000 "application " | TOPICAL_OINTMENT | Freq: Two times a day (BID) | CUTANEOUS | 0 refills | Status: DC

## 2017-03-10 NOTE — Progress Notes (Signed)
Patient ID: EWA HIPP, female   DOB: 1955-12-12, 60 y.o.   MRN: [id number]  Patient is 29 days status post reconstruction of Charcot foot collapse.  She comes in for wound check.  Her stitches are intact.  There is a couple spots of superficial dehiscence with subcutaneous tissue without any evidence of infection.  There is some local erythema around the incision.  Swelling is present but the compartments are soft.  Foot is without neurovascular compromise.  She has a stable heel pressure ulcer.  I switched her over to Bactrim today.  May continue with PT and OT for the left lower extremity.  Recommend Bactroban with Xeroform and dry dressings twice a day.  Follow-up in 1 week for recheck with a either Dr. Sharol Given or Dondra Prader

## 2017-03-10 NOTE — Telephone Encounter (Signed)
Attempted to contact patient - no call back - this encounter will be closed.  

## 2017-03-16 DIAGNOSIS — S91302A Unspecified open wound, left foot, initial encounter: Secondary | ICD-10-CM | POA: Diagnosis not present

## 2017-03-16 DIAGNOSIS — T8189XA Other complications of procedures, not elsewhere classified, initial encounter: Secondary | ICD-10-CM | POA: Diagnosis not present

## 2017-03-17 ENCOUNTER — Ambulatory Visit (INDEPENDENT_AMBULATORY_CARE_PROVIDER_SITE_OTHER): Payer: Medicare Other

## 2017-03-17 ENCOUNTER — Telehealth (INDEPENDENT_AMBULATORY_CARE_PROVIDER_SITE_OTHER): Payer: Self-pay

## 2017-03-17 ENCOUNTER — Ambulatory Visit (INDEPENDENT_AMBULATORY_CARE_PROVIDER_SITE_OTHER): Payer: Medicare Other | Admitting: Orthopedic Surgery

## 2017-03-17 ENCOUNTER — Encounter (INDEPENDENT_AMBULATORY_CARE_PROVIDER_SITE_OTHER): Payer: Self-pay | Admitting: Orthopedic Surgery

## 2017-03-17 DIAGNOSIS — L89621 Pressure ulcer of left heel, stage 1: Secondary | ICD-10-CM

## 2017-03-17 DIAGNOSIS — E11610 Type 2 diabetes mellitus with diabetic neuropathic arthropathy: Secondary | ICD-10-CM

## 2017-03-17 NOTE — Progress Notes (Signed)
Office Visit Note   Patient: Julia West           Date of Birth: [birth year]           MRN: [id number] Visit Date: 03/17/2017              Requested by: [name], MD [place], [place] [address] PCP: [name], MD  Chief Complaint  Patient presents with  . Left Foot - Routine Post Op      HPI: Patient presents in follow-up status post open reduction internal fixation for Charcot collapse left foot.  Patient states that she developed significant side effects from the Bactrim DS and has stopped taking it.  Patient states that her decubitus ulcer is getting worse in the left heel she did see Dr. Elisabeth Cara and recommended moisturizing lotion to the heel.  Patient has not been wearing her PRAFO boot.  Assessment & Plan: Visit Diagnoses:  1. Diabetic neurogenic arthropathy ([place])   2. Decubitus ulcer of left heel, stage 1     Plan: Patient was given instructions for wearing the [facility] boot 24 hours a day strict nonweightbearing on the left heel.  She may begin physical therapy progressive ambulation strict nonweightbearing on the left.  Dry dressing changes to the left heel and surgical incision harvest the sutures today.  Patient may try using a kneeling scooter but discussed that this would take a lot of balance.  Dry dressing change daily.  Follow-Up Instructions: Return in about 2 weeks (around 03/31/2017).   Ortho Exam  Patient is alert, oriented, no adenopathy, well-dressed, normal affect, normal respiratory effort. Examination the surgical incision is healing nicely there is a small area of hyper granulation tissue that is about 5 mm in diameter this was touched with silver nitrate.  Patient has a superficial black eschar over the left heel with a superficial decubitus heel ulcer.  There is no redness no cellulitis there is no skin breakdown.  We will harvest the sutures today.  Imaging: Xr Foot Complete Left  Result Date: 03/17/2017 3 view radiographs  of the left foot shows stable internal fixation for the Charcot collapse left medial condyle and  No images are attached to the encounter.  Labs: Lab Results  Component Value Date   HGBA1C 5.6 02/07/2017   REPTSTATUS 04/29/2012 FINAL 04/24/2012   REPTSTATUS 04/27/2012 FINAL 04/24/2012   GRAMSTAIN  04/24/2012    NO WBC SEEN NO SQUAMOUS EPITHELIAL CELLS SEEN MODERATE GRAM POSITIVE COCCI IN PAIRS RARE GRAM NEGATIVE RODS   GRAMSTAIN  04/24/2012    NO WBC SEEN NO SQUAMOUS EPITHELIAL CELLS SEEN MODERATE GRAM POSITIVE COCCI IN PAIRS RARE GRAM NEGATIVE RODS   CULT NO ANAEROBES ISOLATED 04/24/2012   CULT MODERATE PROTEUS MIRABILIS 04/24/2012   LABORGA PROTEUS MIRABILIS 04/24/2012    @LABSALLVALUES (HGBA1)@  There is no height or weight on file to calculate BMI.  Orders:  Orders Placed This Encounter  Procedures  . XR Foot Complete Left   No orders of the defined types were placed in this encounter.    Procedures: No procedures performed  Clinical Data: No additional findings.  ROS:  All other systems negative, except as noted in the HPI. Review of Systems  Objective: Vital Signs: There were no vitals taken for this visit.  Specialty Comments:  No specialty comments available.  PMFS History: Patient Active Problem List   Diagnosis Date Noted  . Charcot foot due to diabetes mellitus ([place]) 02/09/2017  . Type 2  diabetes mellitus with Charcot's joint of left foot ([place])   . Post-menopausal bleeding 04/15/2014  . Endometrial polyp 04/09/2014  . Ventral hernia 03/06/2012  . Open abdominal wall wound 03/06/2012  . Hyperlipemia 11/12/2008  . OBESITY, MORBID 11/12/2008  . Essential hypertension 11/12/2008  . CAD 11/12/2008   Past Medical History:  Diagnosis Date  . Acute myocardial infarction    with a ruptured plaque in the circumflex in 2003  . Anxiety   . Cancer ([place])    basal cell face  . Complication of anesthesia    states low O2 sats post-op 11/13, always  slow to awaken  . Coronary artery disease   . Degenerative joint disease   . Dyspnea    with activity- car to house - steps  . Heart murmur    "not to be concerned"  . HTN (hypertension)   . Hyperlipidemia LDL goal <70   . Internal and external hemorrhoids without complication   . Lymphedema    right side of body  . Lymphedema of arm    right  . Morbid obesity ([place])   . Surgical wound, non healing ABDOMINAL    has wound vac @ 125 mm Hg    Family History  Problem Relation Age of Onset  . Heart attack Mother   . Hypertension Sister   . Diabetes Brother   . Birth defects Son        heart defect    Past Surgical History:  Procedure Laterality Date  . APPLICATION OF WOUND VAC    . BOWEL RESECTION  02/07/2012   Procedure: SMALL BOWEL RESECTION;  Surgeon: Gayland Curry, MD,FACS;  Location: [facility];  Service: General;;  . CARDIOVASCULAR STRESS TEST  01-17-2012  DR HOCHREIN   LOW RISK NUCLEAR TEST  . CESAREAN SECTION     x 4 in remote past  . CHOLECYSTECTOMY    . CORONARY ARTERY BYPASS GRAFT  2003   by Dr. Servando Snare. LIMA to the LAD, free RIMA to the circumflex. Stress perfusion study December 2009 with no high-risk areas of ischemia. She has a well-preserved ejection fraction  . HYSTEROSCOPY W/D&C N/A 05/14/2014   Procedure: DILATATION AND CURETTAGE (no specimen); HYSTEROSCOPY;  Surgeon: Jonnie Kind, MD;  Location: [facility];  Service: Gynecology;  Laterality: N/A;  . INCISION AND DRAINAGE OF WOUND  04/17/2012   Procedure: IRRIGATION AND DEBRIDEMENT WOUND;  Surgeon: Theodoro Kos, DO;  Location: [facility];  Service: Plastics;  Laterality: N/A;  OF ABDOMINAL WOUND, SURGICAL PREP AND PLACEMENT OF VAC, REMOVAL FOREHEAD SKIN LESION  . INCISION AND DRAINAGE OF WOUND N/A 04/24/2012   Procedure: IRRIGATION AND DEBRIDEMENT WOUND;  Surgeon: Theodoro Kos, DO;  Location: [facility];  Service: Plastics;  Laterality: N/A;  I & D ABDOMINAL WOUND WITH VAC AND ACELL  .  INCISION AND DRAINAGE OF WOUND N/A 05/01/2012   Procedure: IRRIGATION AND DEBRIDEMENT WOUND;  Surgeon: Theodoro Kos, DO;  Location: [facility];  Service: Plastics;  Laterality: N/A;  WITH SURGICAL PREP AND PLACEMENT OF VAC  . INCISION AND DRAINAGE OF WOUND N/A 05/08/2012   Procedure: IRRIGATION AND DEBRIDEMENT OF ABD WOUND SURGICAL PREP AND PLACEMENT OF VAC ;  Surgeon: Theodoro Kos, DO;  Location: [facility];  Service: Plastics;  Laterality: N/A;  IRRIGATION AND DEBRIDEMENT OF ABD WOUND SURGICAL PREP AND PLACEMENT OF VAC   . INCISION AND DRAINAGE OF WOUND N/A 05/15/2012   Procedure: IRRIGATION AND DEBRIDEMENT OF ABDOMINAL  WOUND WITH POSSIBLE SURGICAL PREP AND PLACEMENT OF VAC;  Surgeon: Theodoro Kos, DO;  Location: [facility];  Service: Plastics;  Laterality: N/A;  . INCISION AND DRAINAGE OF WOUND N/A 05/22/2012   Procedure: IRRIGATION AND DEBRIDEMENT OF ABODOMINAL WOUND WITH  SURGICAL PREP AND VAC PLACEMENT;  Surgeon: Theodoro Kos, DO;  Location: [facility];  Service: Plastics;  Laterality: N/A;  . INCISION AND DRAINAGE OF WOUND N/A 06/28/2012   Procedure: IRRIGATION AND DEBRIDEMENT OF ABDOMINAL ULCER SURGICAL PREP AND PLACEMENT OF ACELL AND VAC;  Surgeon: Theodoro Kos, DO;  Location: [facility];  Service: Plastics;  Laterality: N/A;  . INCISIONAL HERNIA REPAIR  02/07/2012   Procedure: HERNIA REPAIR INCISIONAL;  Surgeon: Gayland Curry, MD,FACS;  Location: [facility];  Service: General;;  Open, Primary repair, strangulated Incisional hernia.  Marland Kitchen LESION REMOVAL  04/17/2012   Procedure: LESION REMOVAL;  Surgeon: Theodoro Kos, DO;  Location: [facility];  Service: Plastics;  Laterality: N/A;   CENTER OF FOREHEAD, REMOVAL FORHEAD SKIN LESION  . ORIF TOE FRACTURE Left 02/09/2017   Procedure: EXCISION TALAR HEAD, INTERNAL FIXATION MEDIAL COLUMN LEFT FOOT;  Surgeon: Newt Minion, MD;  Location: [facility];  Service:  Orthopedics;  Laterality: Left;  . POLYPECTOMY N/A 05/14/2014   Procedure: ENDOMETRIAL POLYPECTOMY;  Surgeon: Jonnie Kind, MD;  Location: [facility];  Service: Gynecology;  Laterality: N/A;   Social History   Occupational History  . Occupation: Estate manager/land agent    Comment: In the [employer]  Tobacco Use  . Smoking status: Former Smoker    Packs/day: 1.00    Years: 25.00    Pack years: 25.00    Types: Cigarettes    Last attempt to quit: 11/17/2011    Years since quitting: 5.3  . Smokeless tobacco: Never Used  Substance and Sexual Activity  . Alcohol use: No  . Drug use: No  . Sexual activity: Yes    Birth control/protection: Post-menopausal

## 2017-03-17 NOTE — Telephone Encounter (Signed)
Joy with [facility] would like clarification on elevation of left foot.  CB# is [phone].  Please advise.  Thank you.

## 2017-03-17 NOTE — Telephone Encounter (Signed)
Called and sw Joy to advise that when she is not doing physical therapy ( which is strict non weight bearing on the left) she should continue to elevate higher than her heart with [facility]

## 2017-03-24 ENCOUNTER — Encounter (INDEPENDENT_AMBULATORY_CARE_PROVIDER_SITE_OTHER): Payer: Self-pay | Admitting: Orthopedic Surgery

## 2017-03-24 ENCOUNTER — Ambulatory Visit (INDEPENDENT_AMBULATORY_CARE_PROVIDER_SITE_OTHER): Payer: Medicare Other | Admitting: Orthopedic Surgery

## 2017-03-24 DIAGNOSIS — E11610 Type 2 diabetes mellitus with diabetic neuropathic arthropathy: Secondary | ICD-10-CM

## 2017-03-24 MED ORDER — DOXYCYCLINE HYCLATE 100 MG PO TABS: 100.0000 mg | ORAL_TABLET | Freq: Two times a day (BID) | ORAL | 0 refills | Status: DC

## 2017-03-24 NOTE — Progress Notes (Signed)
Office Visit Note   Patient: Julia West           Date of Birth: March 02, 1956           MRN: [id number] Visit Date: 03/24/2017              Requested by: [name], MD [place], [place] [address] PCP: [name], MD  Chief Complaint  Patient presents with  . 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  Patient is at skilled nursing she has been keeping her foot dependent the most distal wound has broken open and now has hyper granulation tissue.  Assessment & Plan: Visit Diagnoses:  1. 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  Prescription provided for doxycycline.  Follow-Up Instructions: Return in about 2 weeks (around 04/07/2017).   Ortho Exam  Patient is alert, oriented, no adenopathy, well-dressed, normal affect, normal respiratory effort. Examination patient has increased breakdown of the incision with hyper granulation tissue with clear serous drainage.  The 2 layer areas of hyper granulation tissue are 7 mm in diameter and 1 mm proud this was touched with silver nitrate.  Patient does have increased swelling there is no cellulitis no odor no signs of infection.  Discussed the process of Charcot arthropathy discussed the importance of elevation with her foot level with her heart.  Imaging: No results found. No images are attached to the encounter.  Labs: Lab Results  Component Value Date   HGBA1C 5.6 02/07/2017   REPTSTATUS 04/29/2012 FINAL 04/24/2012   REPTSTATUS 04/27/2012 FINAL 04/24/2012   GRAMSTAIN  04/24/2012    NO WBC SEEN NO SQUAMOUS EPITHELIAL CELLS SEEN MODERATE GRAM POSITIVE COCCI IN PAIRS RARE GRAM NEGATIVE RODS   GRAMSTAIN  04/24/2012    NO WBC SEEN NO SQUAMOUS EPITHELIAL CELLS SEEN MODERATE GRAM POSITIVE COCCI IN PAIRS RARE GRAM NEGATIVE RODS   CULT NO ANAEROBES ISOLATED 04/24/2012   CULT MODERATE PROTEUS MIRABILIS 04/24/2012   LABORGA PROTEUS MIRABILIS 04/24/2012    @LABSALLVALUES (HGBA1)@  There is no height or weight on file to calculate BMI.  Orders:  No orders of the defined types were placed in this encounter.  Meds ordered this encounter  Medications  . doxycycline (VIBRA-TABS) 100 MG tablet    Sig: Take 1 tablet (100 mg total) by mouth 2 (two) times daily.    Dispense:  60 tablet    Refill:  0     Procedures: No procedures performed  Clinical Data: No additional findings.  ROS:  All other systems negative, except as noted in the HPI. Review of Systems  Objective: Vital Signs: There were no vitals taken for this visit.  Specialty Comments:  No specialty comments available.  PMFS History: Patient Active Problem List   Diagnosis Date Noted  . Charcot foot due to diabetes mellitus ([place]) 02/09/2017  . 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 Post-menopausal bleeding 04/15/2014  . Endometrial polyp 04/09/2014  . Ventral hernia 03/06/2012  . Open abdominal wall wound 03/06/2012  . Hyperlipemia 11/12/2008  . OBESITY, MORBID 11/12/2008  . Essential hypertension 11/12/2008  . CAD 11/12/2008   Past Medical History:  Diagnosis  Date  . Acute myocardial infarction    with a ruptured plaque in the circumflex in 2003  . Anxiety   . Cancer ([place])    basal cell face  . Complication of anesthesia    states low O2 sats post-op 11/13, always slow to awaken  . Coronary artery disease   . Degenerative joint disease   . Dyspnea    with activity- car to house - steps  . Heart murmur    "not to be concerned"  . HTN (hypertension)   . Hyperlipidemia LDL goal <70   . Internal and external hemorrhoids without complication   . Lymphedema    right  side of body  . Lymphedema of arm    right  . Morbid obesity ([place])   . Surgical wound, non healing ABDOMINAL    has wound vac @ 125 mm Hg    Family History  Problem Relation Age of Onset  . Heart attack Mother   . Hypertension Sister   . Diabetes Brother   . Birth defects Son        heart defect    Past Surgical History:  Procedure Laterality Date  . APPLICATION OF WOUND VAC    . BOWEL RESECTION  02/07/2012   Procedure: SMALL BOWEL RESECTION;  Surgeon: Gayland Curry, MD,FACS;  Location: [facility];  Service: General;;  . CARDIOVASCULAR STRESS TEST  01-17-2012  DR HOCHREIN   LOW RISK NUCLEAR TEST  . CESAREAN SECTION     x 4 in remote past  . CHOLECYSTECTOMY    . CORONARY ARTERY BYPASS GRAFT  2003   by Dr. Servando Snare. LIMA to the LAD, free RIMA to the circumflex. Stress perfusion study December 2009 with no high-risk areas of ischemia. She has a well-preserved ejection fraction  . HYSTEROSCOPY W/D&C N/A 05/14/2014   Procedure: DILATATION AND CURETTAGE (no specimen); HYSTEROSCOPY;  Surgeon: Jonnie Kind, MD;  Location: [facility];  Service: Gynecology;  Laterality: N/A;  . INCISION AND DRAINAGE OF WOUND  04/17/2012   Procedure: IRRIGATION AND DEBRIDEMENT WOUND;  Surgeon: Theodoro Kos, DO;  Location: [facility];  Service: Plastics;  Laterality: N/A;  OF ABDOMINAL WOUND, SURGICAL PREP AND PLACEMENT OF VAC, REMOVAL FOREHEAD SKIN LESION  . INCISION AND DRAINAGE OF WOUND N/A 04/24/2012   Procedure: IRRIGATION AND DEBRIDEMENT WOUND;  Surgeon: Theodoro Kos, DO;  Location: [facility];  Service: Plastics;  Laterality: N/A;  I & D ABDOMINAL WOUND WITH VAC AND ACELL  . INCISION AND DRAINAGE OF WOUND N/A 05/01/2012   Procedure: IRRIGATION AND DEBRIDEMENT WOUND;  Surgeon: Theodoro Kos, DO;  Location: [facility];  Service: Plastics;  Laterality: N/A;  WITH SURGICAL PREP AND PLACEMENT OF VAC  . INCISION AND DRAINAGE OF WOUND N/A 05/08/2012   Procedure: IRRIGATION  AND DEBRIDEMENT OF ABD WOUND SURGICAL PREP AND PLACEMENT OF VAC ;  Surgeon: Theodoro Kos, DO;  Location: [facility];  Service: Plastics;  Laterality: N/A;  IRRIGATION AND DEBRIDEMENT OF ABD WOUND SURGICAL PREP AND PLACEMENT OF VAC   . INCISION AND DRAINAGE OF WOUND N/A 05/15/2012   Procedure: IRRIGATION AND DEBRIDEMENT OF ABDOMINAL WOUND WITH POSSIBLE SURGICAL PREP AND PLACEMENT OF VAC;  Surgeon: Theodoro Kos, DO;  Location: [facility];  Service: Plastics;  Laterality: N/A;  . INCISION AND DRAINAGE OF WOUND N/A 05/22/2012   Procedure: IRRIGATION AND DEBRIDEMENT OF ABODOMINAL WOUND WITH  SURGICAL PREP AND VAC PLACEMENT;  Surgeon: Theodoro Kos, DO;  Location: [facility];  Service: Plastics;  Laterality: N/A;  . INCISION AND DRAINAGE OF WOUND N/A 06/28/2012   Procedure: IRRIGATION AND DEBRIDEMENT OF ABDOMINAL ULCER SURGICAL PREP AND PLACEMENT OF ACELL AND VAC;  Surgeon: Theodoro Kos, DO;  Location: [facility];  Service: Plastics;  Laterality: N/A;  . INCISIONAL HERNIA REPAIR  02/07/2012   Procedure: HERNIA REPAIR INCISIONAL;  Surgeon: Gayland Curry, MD,FACS;  Location: [facility];  Service: General;;  Open, Primary repair, strangulated Incisional hernia.  Marland Kitchen LESION REMOVAL  04/17/2012   Procedure: LESION REMOVAL;  Surgeon: Theodoro Kos, DO;  Location: [facility];  Service: Plastics;  Laterality: N/A;   CENTER OF FOREHEAD, REMOVAL FORHEAD SKIN LESION  . ORIF TOE FRACTURE Left 02/09/2017   Procedure: EXCISION TALAR HEAD, INTERNAL FIXATION MEDIAL COLUMN LEFT FOOT;  Surgeon: Newt Minion, MD;  Location: [facility];  Service: Orthopedics;  Laterality: Left;  . POLYPECTOMY N/A 05/14/2014   Procedure: ENDOMETRIAL POLYPECTOMY;  Surgeon: Jonnie Kind, MD;  Location: [facility];  Service: Gynecology;  Laterality: N/A;   Social History   Occupational History  . Occupation: Estate manager/land agent    Comment: In the [employer]  Tobacco Use  . Smoking  status: Former Smoker    Packs/day: 1.00    Years: 25.00    Pack years: 25.00    Types: Cigarettes    Last attempt to quit: 11/17/2011    Years since quitting: 5.3  . Smokeless tobacco: Never Used  Substance and Sexual Activity  . Alcohol use: No  . Drug use: No  . Sexual activity: Yes    Birth control/protection: Post-menopausal

## 2017-03-30 ENCOUNTER — Ambulatory Visit: Payer: Medicare Other | Admitting: Cardiology

## 2017-03-31 ENCOUNTER — Ambulatory Visit (INDEPENDENT_AMBULATORY_CARE_PROVIDER_SITE_OTHER): Payer: Medicare Other | Admitting: Orthopedic Surgery

## 2017-04-07 ENCOUNTER — Ambulatory Visit (INDEPENDENT_AMBULATORY_CARE_PROVIDER_SITE_OTHER): Payer: Medicare Other | Admitting: Orthopedic Surgery

## 2017-04-07 ENCOUNTER — Encounter (INDEPENDENT_AMBULATORY_CARE_PROVIDER_SITE_OTHER): Payer: Self-pay | Admitting: Orthopedic Surgery

## 2017-04-07 VITALS — Ht 66.0 in | Wt 280.0 lb

## 2017-04-07 DIAGNOSIS — E11610 Type 2 diabetes mellitus with diabetic neuropathic arthropathy: Secondary | ICD-10-CM

## 2017-04-07 DIAGNOSIS — L89621 Pressure ulcer of left heel, stage 1: Secondary | ICD-10-CM

## 2017-04-07 NOTE — Progress Notes (Signed)
Office Visit Note   Patient: Julia West           Date of Birth: 1955/12/10           MRN: [id number] Visit Date: 04/07/2017              Requested by: [name], MD [place], [place] [address] PCP: [name], MD  Chief Complaint  Patient presents with  . Left Foot - Routine Post Op    02/09/17 Excision talar head internal fixation medial column left 8 weeks post op      HPI: Patient presents 2 months status post open reduction internal fixation for Charcot collapse left foot.  Patient is leaving skilled nursing shortly and will have to put some weightbearing on her foot in order to get around her home.  She states that the skilled nursing will not allow her to stay any longer.  We will place her in a fracture boot to allow her for weightbearing for transfers only she will start Dial soap cleansing dry dressing changes daily with an Ace wrap.  Assessment & Plan: Visit Diagnoses:  1. 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 Decubitus ulcer of left heel, stage 1     Plan: Follow-up in 2 weeks with 3 view radiographs of the left foot.  Examination patient has 2 small areas of clear  Follow-Up Instructions: Return in about 2 weeks (around 04/21/2017).   Ortho Exam  Patient is alert, oriented, no adenopathy, well-dressed, normal affect, normal respiratory effort. Serous drainage with hyper granulation tissue there is no cellulitis no odor no clinical signs of infection there is no pain to palpation around the foot.  Imaging: No results found. No images are attached to the encounter.  Labs: Lab Results  Component Value Date   HGBA1C 5.6 02/07/2017   REPTSTATUS 04/29/2012 FINAL 04/24/2012   REPTSTATUS 04/27/2012 FINAL 04/24/2012   GRAMSTAIN  04/24/2012    NO WBC SEEN NO SQUAMOUS EPITHELIAL CELLS SEEN MODERATE GRAM POSITIVE COCCI IN PAIRS RARE GRAM NEGATIVE RODS   GRAMSTAIN  04/24/2012    NO WBC SEEN NO SQUAMOUS  EPITHELIAL CELLS SEEN MODERATE GRAM POSITIVE COCCI IN PAIRS RARE GRAM NEGATIVE RODS   CULT NO ANAEROBES ISOLATED 04/24/2012   CULT MODERATE PROTEUS MIRABILIS 04/24/2012   LABORGA PROTEUS MIRABILIS 04/24/2012    @LABSALLVALUES (HGBA1)@  Body mass index is 45.19 kg/m.  Orders:  No orders of the defined types were placed in this encounter.  No orders of the defined types were placed in this encounter.    Procedures: No procedures performed  Clinical Data: No additional findings.  ROS:  All other systems negative, except as noted in the HPI. Review of Systems  Objective: Vital Signs: Ht 5\' 6"  (1.676 m)   Wt 280 lb (127 kg)   BMI 45.19 kg/m   Specialty Comments:  No specialty comments available.  PMFS History: Patient Active Problem List   Diagnosis Date Noted  . Charcot foot due to diabetes mellitus ([place]) 02/09/2017  . 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 Post-menopausal bleeding 04/15/2014  . Endometrial polyp 04/09/2014  . Ventral hernia 03/06/2012  . Open abdominal wall wound 03/06/2012  . Hyperlipemia 11/12/2008  . OBESITY, MORBID 11/12/2008  . Essential hypertension 11/12/2008  . CAD 11/12/2008   Past Medical History:  Diagnosis Date  . Acute myocardial infarction    with a ruptured plaque  in the circumflex in 2003  . Anxiety   . Cancer ([place])    basal cell face  . Complication of anesthesia    states low O2 sats post-op 11/13, always slow to awaken  . Coronary artery disease   . Degenerative joint disease   . Dyspnea    with activity- car to house - steps  . Heart murmur    "not to be concerned"  . HTN (hypertension)   . Hyperlipidemia LDL goal <70   . Internal and external hemorrhoids without complication   . Lymphedema    right side of body  . Lymphedema of arm    right  . Morbid obesity ([place])   . Surgical wound, non healing ABDOMINAL    has wound vac @ 125 mm Hg    Family History  Problem Relation Age  of Onset  . Heart attack Mother   . Hypertension Sister   . Diabetes Brother   . Birth defects Son        heart defect    Past Surgical History:  Procedure Laterality Date  . APPLICATION OF WOUND VAC    . BOWEL RESECTION  02/07/2012   Procedure: SMALL BOWEL RESECTION;  Surgeon: Gayland Curry, MD,FACS;  Location: [facility];  Service: General;;  . CARDIOVASCULAR STRESS TEST  01-17-2012  DR HOCHREIN   LOW RISK NUCLEAR TEST  . CESAREAN SECTION     x 4 in remote past  . CHOLECYSTECTOMY    . CORONARY ARTERY BYPASS GRAFT  2003   by Dr. Servando Snare. LIMA to the LAD, free RIMA to the circumflex. Stress perfusion study December 2009 with no high-risk areas of ischemia. She has a well-preserved ejection fraction  . HYSTEROSCOPY W/D&C N/A 05/14/2014   Procedure: DILATATION AND CURETTAGE (no specimen); HYSTEROSCOPY;  Surgeon: Jonnie Kind, MD;  Location: [facility];  Service: Gynecology;  Laterality: N/A;  . INCISION AND DRAINAGE OF WOUND  04/17/2012   Procedure: IRRIGATION AND DEBRIDEMENT WOUND;  Surgeon: Theodoro Kos, DO;  Location: [facility];  Service: Plastics;  Laterality: N/A;  OF ABDOMINAL WOUND, SURGICAL PREP AND PLACEMENT OF VAC, REMOVAL FOREHEAD SKIN LESION  . INCISION AND DRAINAGE OF WOUND N/A 04/24/2012   Procedure: IRRIGATION AND DEBRIDEMENT WOUND;  Surgeon: Theodoro Kos, DO;  Location: [name];  Service: Plastics;  Laterality: N/A;  I & D ABDOMINAL WOUND WITH VAC AND ACELL  . INCISION AND DRAINAGE OF WOUND N/A 05/01/2012   Procedure: IRRIGATION AND DEBRIDEMENT WOUND;  Surgeon: Theodoro Kos, DO;  Location: [facility];  Service: Plastics;  Laterality: N/A;  WITH SURGICAL PREP AND PLACEMENT OF VAC  . INCISION AND DRAINAGE OF WOUND N/A 05/08/2012   Procedure: IRRIGATION AND DEBRIDEMENT OF ABD WOUND SURGICAL PREP AND PLACEMENT OF VAC ;  Surgeon: Theodoro Kos, DO;  Location: [facility];  Service: Plastics;  Laterality: N/A;  IRRIGATION  AND DEBRIDEMENT OF ABD WOUND SURGICAL PREP AND PLACEMENT OF VAC   . INCISION AND DRAINAGE OF WOUND N/A 05/15/2012   Procedure: IRRIGATION AND DEBRIDEMENT OF ABDOMINAL WOUND WITH POSSIBLE SURGICAL PREP AND PLACEMENT OF VAC;  Surgeon: Theodoro Kos, DO;  Location: [facility];  Service: Plastics;  Laterality: N/A;  . INCISION AND DRAINAGE OF WOUND N/A 05/22/2012   Procedure: IRRIGATION AND DEBRIDEMENT OF ABODOMINAL WOUND WITH  SURGICAL PREP AND VAC PLACEMENT;  Surgeon: Theodoro Kos, DO;  Location: [facility];  Service: Plastics;  Laterality: N/A;  .  INCISION AND DRAINAGE OF WOUND N/A 06/28/2012   Procedure: IRRIGATION AND DEBRIDEMENT OF ABDOMINAL ULCER SURGICAL PREP AND PLACEMENT OF ACELL AND VAC;  Surgeon: Theodoro Kos, DO;  Location: [facility];  Service: Plastics;  Laterality: N/A;  . INCISIONAL HERNIA REPAIR  02/07/2012   Procedure: HERNIA REPAIR INCISIONAL;  Surgeon: Gayland Curry, MD,FACS;  Location: [facility];  Service: General;;  Open, Primary repair, strangulated Incisional hernia.  Marland Kitchen LESION REMOVAL  04/17/2012   Procedure: LESION REMOVAL;  Surgeon: Theodoro Kos, DO;  Location: [facility];  Service: Plastics;  Laterality: N/A;   CENTER OF FOREHEAD, REMOVAL FORHEAD SKIN LESION  . ORIF TOE FRACTURE Left 02/09/2017   Procedure: EXCISION TALAR HEAD, INTERNAL FIXATION MEDIAL COLUMN LEFT FOOT;  Surgeon: Newt Minion, MD;  Location: [facility];  Service: Orthopedics;  Laterality: Left;  . POLYPECTOMY N/A 05/14/2014   Procedure: ENDOMETRIAL POLYPECTOMY;  Surgeon: Jonnie Kind, MD;  Location: [facility];  Service: Gynecology;  Laterality: N/A;   Social History   Occupational History  . Occupation: Estate manager/land agent    Comment: In the [employer]  Tobacco Use  . Smoking status: Former Smoker    Packs/day: 1.00    Years: 25.00    Pack years: 25.00    Types: Cigarettes    Last attempt to quit: 11/17/2011    Years since quitting: 5.3  . Smokeless  tobacco: Never Used  Substance and Sexual Activity  . Alcohol use: No  . Drug use: No  . Sexual activity: Yes    Birth control/protection: Post-menopausal

## 2017-04-10 DIAGNOSIS — Z85828 Personal history of other malignant neoplasm of skin: Secondary | ICD-10-CM | POA: Diagnosis not present

## 2017-04-10 DIAGNOSIS — M14672 Charcot's joint, left ankle and foot: Secondary | ICD-10-CM | POA: Diagnosis not present

## 2017-04-10 DIAGNOSIS — I119 Hypertensive heart disease without heart failure: Secondary | ICD-10-CM | POA: Diagnosis not present

## 2017-04-10 DIAGNOSIS — G8929 Other chronic pain: Secondary | ICD-10-CM | POA: Diagnosis not present

## 2017-04-10 DIAGNOSIS — R2681 Unsteadiness on feet: Secondary | ICD-10-CM | POA: Diagnosis not present

## 2017-04-10 DIAGNOSIS — Z4801 Encounter for change or removal of surgical wound dressing: Secondary | ICD-10-CM | POA: Diagnosis not present

## 2017-04-10 DIAGNOSIS — I890 Lymphedema, not elsewhere classified: Secondary | ICD-10-CM | POA: Diagnosis not present

## 2017-04-10 DIAGNOSIS — G4700 Insomnia, unspecified: Secondary | ICD-10-CM | POA: Diagnosis not present

## 2017-04-10 DIAGNOSIS — I2510 Atherosclerotic heart disease of native coronary artery without angina pectoris: Secondary | ICD-10-CM | POA: Diagnosis not present

## 2017-04-10 DIAGNOSIS — Z951 Presence of aortocoronary bypass graft: Secondary | ICD-10-CM | POA: Diagnosis not present

## 2017-04-10 DIAGNOSIS — E785 Hyperlipidemia, unspecified: Secondary | ICD-10-CM | POA: Diagnosis not present

## 2017-04-10 DIAGNOSIS — Z79891 Long term (current) use of opiate analgesic: Secondary | ICD-10-CM | POA: Diagnosis not present

## 2017-04-10 DIAGNOSIS — M86172 Other acute osteomyelitis, left ankle and foot: Secondary | ICD-10-CM | POA: Diagnosis not present

## 2017-04-10 DIAGNOSIS — Z7982 Long term (current) use of aspirin: Secondary | ICD-10-CM | POA: Diagnosis not present

## 2017-04-10 DIAGNOSIS — I252 Old myocardial infarction: Secondary | ICD-10-CM | POA: Diagnosis not present

## 2017-04-10 DIAGNOSIS — L89620 Pressure ulcer of left heel, unstageable: Secondary | ICD-10-CM | POA: Diagnosis not present

## 2017-04-10 DIAGNOSIS — M79672 Pain in left foot: Secondary | ICD-10-CM | POA: Diagnosis not present

## 2017-04-11 ENCOUNTER — Telehealth (INDEPENDENT_AMBULATORY_CARE_PROVIDER_SITE_OTHER): Payer: Self-pay | Admitting: *Deleted

## 2017-04-11 DIAGNOSIS — E785 Hyperlipidemia, unspecified: Secondary | ICD-10-CM | POA: Diagnosis not present

## 2017-04-11 DIAGNOSIS — M79672 Pain in left foot: Secondary | ICD-10-CM | POA: Diagnosis not present

## 2017-04-11 DIAGNOSIS — G8929 Other chronic pain: Secondary | ICD-10-CM | POA: Diagnosis not present

## 2017-04-11 DIAGNOSIS — Z79891 Long term (current) use of opiate analgesic: Secondary | ICD-10-CM | POA: Diagnosis not present

## 2017-04-11 DIAGNOSIS — M86172 Other acute osteomyelitis, left ankle and foot: Secondary | ICD-10-CM | POA: Diagnosis not present

## 2017-04-11 DIAGNOSIS — Z4801 Encounter for change or removal of surgical wound dressing: Secondary | ICD-10-CM | POA: Diagnosis not present

## 2017-04-11 DIAGNOSIS — Z7982 Long term (current) use of aspirin: Secondary | ICD-10-CM | POA: Diagnosis not present

## 2017-04-11 DIAGNOSIS — L89620 Pressure ulcer of left heel, unstageable: Secondary | ICD-10-CM | POA: Diagnosis not present

## 2017-04-11 DIAGNOSIS — I119 Hypertensive heart disease without heart failure: Secondary | ICD-10-CM | POA: Diagnosis not present

## 2017-04-11 DIAGNOSIS — G4700 Insomnia, unspecified: Secondary | ICD-10-CM | POA: Diagnosis not present

## 2017-04-11 DIAGNOSIS — I2510 Atherosclerotic heart disease of native coronary artery without angina pectoris: Secondary | ICD-10-CM | POA: Diagnosis not present

## 2017-04-11 DIAGNOSIS — I252 Old myocardial infarction: Secondary | ICD-10-CM | POA: Diagnosis not present

## 2017-04-11 DIAGNOSIS — M14672 Charcot's joint, left ankle and foot: Secondary | ICD-10-CM | POA: Diagnosis not present

## 2017-04-11 DIAGNOSIS — Z951 Presence of aortocoronary bypass graft: Secondary | ICD-10-CM | POA: Diagnosis not present

## 2017-04-11 DIAGNOSIS — I890 Lymphedema, not elsewhere classified: Secondary | ICD-10-CM | POA: Diagnosis not present

## 2017-04-11 NOTE — Telephone Encounter (Signed)
Received call from Kathlee Nations with [facility] stating pt recently had L foot surgery and pt has a L wound that is unstageable escar pressure ulcer of L Heal and wants to know if can paint it with betadine qd. Please advise.  CB: [phone]

## 2017-04-12 DIAGNOSIS — Z7982 Long term (current) use of aspirin: Secondary | ICD-10-CM | POA: Diagnosis not present

## 2017-04-12 DIAGNOSIS — I119 Hypertensive heart disease without heart failure: Secondary | ICD-10-CM | POA: Diagnosis not present

## 2017-04-12 DIAGNOSIS — I252 Old myocardial infarction: Secondary | ICD-10-CM | POA: Diagnosis not present

## 2017-04-12 DIAGNOSIS — Z79891 Long term (current) use of opiate analgesic: Secondary | ICD-10-CM | POA: Diagnosis not present

## 2017-04-12 DIAGNOSIS — L89620 Pressure ulcer of left heel, unstageable: Secondary | ICD-10-CM | POA: Diagnosis not present

## 2017-04-12 DIAGNOSIS — Z951 Presence of aortocoronary bypass graft: Secondary | ICD-10-CM | POA: Diagnosis not present

## 2017-04-12 DIAGNOSIS — M14672 Charcot's joint, left ankle and foot: Secondary | ICD-10-CM | POA: Diagnosis not present

## 2017-04-12 DIAGNOSIS — E785 Hyperlipidemia, unspecified: Secondary | ICD-10-CM | POA: Diagnosis not present

## 2017-04-12 DIAGNOSIS — G4700 Insomnia, unspecified: Secondary | ICD-10-CM | POA: Diagnosis not present

## 2017-04-12 DIAGNOSIS — G8929 Other chronic pain: Secondary | ICD-10-CM | POA: Diagnosis not present

## 2017-04-12 DIAGNOSIS — I2510 Atherosclerotic heart disease of native coronary artery without angina pectoris: Secondary | ICD-10-CM | POA: Diagnosis not present

## 2017-04-12 DIAGNOSIS — M86172 Other acute osteomyelitis, left ankle and foot: Secondary | ICD-10-CM | POA: Diagnosis not present

## 2017-04-12 DIAGNOSIS — M79672 Pain in left foot: Secondary | ICD-10-CM | POA: Diagnosis not present

## 2017-04-12 DIAGNOSIS — I890 Lymphedema, not elsewhere classified: Secondary | ICD-10-CM | POA: Diagnosis not present

## 2017-04-12 DIAGNOSIS — Z4801 Encounter for change or removal of surgical wound dressing: Secondary | ICD-10-CM | POA: Diagnosis not present

## 2017-04-12 MED ORDER — SILVER SULFADIAZINE 1 % EX CREA
1.0000 | TOPICAL_CREAM | Freq: Every day | CUTANEOUS | 0 refills | Status: DC
Start: 2017-04-12 — End: 2017-05-11

## 2017-04-12 NOTE — Telephone Encounter (Signed)
HHN aking if ok  to paint heel eschar with betadine daily as wound care what would you like them to do?

## 2017-04-12 NOTE — Telephone Encounter (Signed)
I called and spoke with Julia West to advise that per Dr. Sharol Given no betadine painting, please apply small amount of silvadene to eschar daily and float heels off the bed. She advise rx would be needed for silvadene. This was sent to [facility] in [place] saved in patients chart.

## 2017-04-12 NOTE — Telephone Encounter (Signed)
Could they apply a small amount of silvadine with dry dressing instead, make sure heels are floated off the bed

## 2017-04-13 DIAGNOSIS — I2510 Atherosclerotic heart disease of native coronary artery without angina pectoris: Secondary | ICD-10-CM | POA: Diagnosis not present

## 2017-04-13 DIAGNOSIS — I890 Lymphedema, not elsewhere classified: Secondary | ICD-10-CM | POA: Diagnosis not present

## 2017-04-13 DIAGNOSIS — G8929 Other chronic pain: Secondary | ICD-10-CM | POA: Diagnosis not present

## 2017-04-13 DIAGNOSIS — Z4801 Encounter for change or removal of surgical wound dressing: Secondary | ICD-10-CM | POA: Diagnosis not present

## 2017-04-13 DIAGNOSIS — Z79891 Long term (current) use of opiate analgesic: Secondary | ICD-10-CM | POA: Diagnosis not present

## 2017-04-13 DIAGNOSIS — E785 Hyperlipidemia, unspecified: Secondary | ICD-10-CM | POA: Diagnosis not present

## 2017-04-13 DIAGNOSIS — G4700 Insomnia, unspecified: Secondary | ICD-10-CM | POA: Diagnosis not present

## 2017-04-13 DIAGNOSIS — M79672 Pain in left foot: Secondary | ICD-10-CM | POA: Diagnosis not present

## 2017-04-13 DIAGNOSIS — I252 Old myocardial infarction: Secondary | ICD-10-CM | POA: Diagnosis not present

## 2017-04-13 DIAGNOSIS — L89620 Pressure ulcer of left heel, unstageable: Secondary | ICD-10-CM | POA: Diagnosis not present

## 2017-04-13 DIAGNOSIS — M14672 Charcot's joint, left ankle and foot: Secondary | ICD-10-CM | POA: Diagnosis not present

## 2017-04-13 DIAGNOSIS — Z951 Presence of aortocoronary bypass graft: Secondary | ICD-10-CM | POA: Diagnosis not present

## 2017-04-13 DIAGNOSIS — Z7982 Long term (current) use of aspirin: Secondary | ICD-10-CM | POA: Diagnosis not present

## 2017-04-13 DIAGNOSIS — M86172 Other acute osteomyelitis, left ankle and foot: Secondary | ICD-10-CM | POA: Diagnosis not present

## 2017-04-13 DIAGNOSIS — I119 Hypertensive heart disease without heart failure: Secondary | ICD-10-CM | POA: Diagnosis not present

## 2017-04-14 DIAGNOSIS — M86172 Other acute osteomyelitis, left ankle and foot: Secondary | ICD-10-CM | POA: Diagnosis not present

## 2017-04-14 DIAGNOSIS — I252 Old myocardial infarction: Secondary | ICD-10-CM | POA: Diagnosis not present

## 2017-04-14 DIAGNOSIS — T8189XA Other complications of procedures, not elsewhere classified, initial encounter: Secondary | ICD-10-CM | POA: Diagnosis not present

## 2017-04-14 DIAGNOSIS — Z951 Presence of aortocoronary bypass graft: Secondary | ICD-10-CM | POA: Diagnosis not present

## 2017-04-14 DIAGNOSIS — G8929 Other chronic pain: Secondary | ICD-10-CM | POA: Diagnosis not present

## 2017-04-14 DIAGNOSIS — Z7982 Long term (current) use of aspirin: Secondary | ICD-10-CM | POA: Diagnosis not present

## 2017-04-14 DIAGNOSIS — S31105A Unspecified open wound of abdominal wall, periumbilic region without penetration into peritoneal cavity, initial encounter: Secondary | ICD-10-CM | POA: Diagnosis not present

## 2017-04-14 DIAGNOSIS — G4700 Insomnia, unspecified: Secondary | ICD-10-CM | POA: Diagnosis not present

## 2017-04-14 DIAGNOSIS — I2510 Atherosclerotic heart disease of native coronary artery without angina pectoris: Secondary | ICD-10-CM | POA: Diagnosis not present

## 2017-04-14 DIAGNOSIS — E785 Hyperlipidemia, unspecified: Secondary | ICD-10-CM | POA: Diagnosis not present

## 2017-04-14 DIAGNOSIS — I119 Hypertensive heart disease without heart failure: Secondary | ICD-10-CM | POA: Diagnosis not present

## 2017-04-14 DIAGNOSIS — I890 Lymphedema, not elsewhere classified: Secondary | ICD-10-CM | POA: Diagnosis not present

## 2017-04-14 DIAGNOSIS — M79672 Pain in left foot: Secondary | ICD-10-CM | POA: Diagnosis not present

## 2017-04-14 DIAGNOSIS — Z4801 Encounter for change or removal of surgical wound dressing: Secondary | ICD-10-CM | POA: Diagnosis not present

## 2017-04-14 DIAGNOSIS — Z79891 Long term (current) use of opiate analgesic: Secondary | ICD-10-CM | POA: Diagnosis not present

## 2017-04-14 DIAGNOSIS — M14672 Charcot's joint, left ankle and foot: Secondary | ICD-10-CM | POA: Diagnosis not present

## 2017-04-14 DIAGNOSIS — L89620 Pressure ulcer of left heel, unstageable: Secondary | ICD-10-CM | POA: Diagnosis not present

## 2017-04-15 DIAGNOSIS — Z79891 Long term (current) use of opiate analgesic: Secondary | ICD-10-CM | POA: Diagnosis not present

## 2017-04-15 DIAGNOSIS — L89620 Pressure ulcer of left heel, unstageable: Secondary | ICD-10-CM | POA: Diagnosis not present

## 2017-04-15 DIAGNOSIS — Z4801 Encounter for change or removal of surgical wound dressing: Secondary | ICD-10-CM | POA: Diagnosis not present

## 2017-04-15 DIAGNOSIS — M79672 Pain in left foot: Secondary | ICD-10-CM | POA: Diagnosis not present

## 2017-04-15 DIAGNOSIS — M14672 Charcot's joint, left ankle and foot: Secondary | ICD-10-CM | POA: Diagnosis not present

## 2017-04-15 DIAGNOSIS — Z951 Presence of aortocoronary bypass graft: Secondary | ICD-10-CM | POA: Diagnosis not present

## 2017-04-15 DIAGNOSIS — I119 Hypertensive heart disease without heart failure: Secondary | ICD-10-CM | POA: Diagnosis not present

## 2017-04-15 DIAGNOSIS — I252 Old myocardial infarction: Secondary | ICD-10-CM | POA: Diagnosis not present

## 2017-04-15 DIAGNOSIS — E785 Hyperlipidemia, unspecified: Secondary | ICD-10-CM | POA: Diagnosis not present

## 2017-04-15 DIAGNOSIS — I890 Lymphedema, not elsewhere classified: Secondary | ICD-10-CM | POA: Diagnosis not present

## 2017-04-15 DIAGNOSIS — M86172 Other acute osteomyelitis, left ankle and foot: Secondary | ICD-10-CM | POA: Diagnosis not present

## 2017-04-15 DIAGNOSIS — I2510 Atherosclerotic heart disease of native coronary artery without angina pectoris: Secondary | ICD-10-CM | POA: Diagnosis not present

## 2017-04-15 DIAGNOSIS — G8929 Other chronic pain: Secondary | ICD-10-CM | POA: Diagnosis not present

## 2017-04-15 DIAGNOSIS — G4700 Insomnia, unspecified: Secondary | ICD-10-CM | POA: Diagnosis not present

## 2017-04-15 DIAGNOSIS — Z7982 Long term (current) use of aspirin: Secondary | ICD-10-CM | POA: Diagnosis not present

## 2017-04-18 DIAGNOSIS — Z4801 Encounter for change or removal of surgical wound dressing: Secondary | ICD-10-CM | POA: Diagnosis not present

## 2017-04-18 DIAGNOSIS — M86172 Other acute osteomyelitis, left ankle and foot: Secondary | ICD-10-CM | POA: Diagnosis not present

## 2017-04-18 DIAGNOSIS — L89620 Pressure ulcer of left heel, unstageable: Secondary | ICD-10-CM | POA: Diagnosis not present

## 2017-04-18 DIAGNOSIS — M14672 Charcot's joint, left ankle and foot: Secondary | ICD-10-CM | POA: Diagnosis not present

## 2017-04-18 DIAGNOSIS — I119 Hypertensive heart disease without heart failure: Secondary | ICD-10-CM | POA: Diagnosis not present

## 2017-04-18 DIAGNOSIS — I252 Old myocardial infarction: Secondary | ICD-10-CM | POA: Diagnosis not present

## 2017-04-18 DIAGNOSIS — Z951 Presence of aortocoronary bypass graft: Secondary | ICD-10-CM | POA: Diagnosis not present

## 2017-04-18 DIAGNOSIS — E785 Hyperlipidemia, unspecified: Secondary | ICD-10-CM | POA: Diagnosis not present

## 2017-04-18 DIAGNOSIS — I890 Lymphedema, not elsewhere classified: Secondary | ICD-10-CM | POA: Diagnosis not present

## 2017-04-18 DIAGNOSIS — G8929 Other chronic pain: Secondary | ICD-10-CM | POA: Diagnosis not present

## 2017-04-18 DIAGNOSIS — M79672 Pain in left foot: Secondary | ICD-10-CM | POA: Diagnosis not present

## 2017-04-18 DIAGNOSIS — G4700 Insomnia, unspecified: Secondary | ICD-10-CM | POA: Diagnosis not present

## 2017-04-18 DIAGNOSIS — Z7982 Long term (current) use of aspirin: Secondary | ICD-10-CM | POA: Diagnosis not present

## 2017-04-18 DIAGNOSIS — I2510 Atherosclerotic heart disease of native coronary artery without angina pectoris: Secondary | ICD-10-CM | POA: Diagnosis not present

## 2017-04-18 DIAGNOSIS — Z79891 Long term (current) use of opiate analgesic: Secondary | ICD-10-CM | POA: Diagnosis not present

## 2017-04-19 DIAGNOSIS — M86172 Other acute osteomyelitis, left ankle and foot: Secondary | ICD-10-CM | POA: Diagnosis not present

## 2017-04-19 DIAGNOSIS — R2681 Unsteadiness on feet: Secondary | ICD-10-CM | POA: Diagnosis not present

## 2017-04-19 DIAGNOSIS — Z85828 Personal history of other malignant neoplasm of skin: Secondary | ICD-10-CM | POA: Diagnosis not present

## 2017-04-19 DIAGNOSIS — E785 Hyperlipidemia, unspecified: Secondary | ICD-10-CM | POA: Diagnosis not present

## 2017-04-19 DIAGNOSIS — Z7982 Long term (current) use of aspirin: Secondary | ICD-10-CM | POA: Diagnosis not present

## 2017-04-19 DIAGNOSIS — M79672 Pain in left foot: Secondary | ICD-10-CM | POA: Diagnosis not present

## 2017-04-19 DIAGNOSIS — G8929 Other chronic pain: Secondary | ICD-10-CM | POA: Diagnosis not present

## 2017-04-19 DIAGNOSIS — Z4801 Encounter for change or removal of surgical wound dressing: Secondary | ICD-10-CM | POA: Diagnosis not present

## 2017-04-19 DIAGNOSIS — T8189XA Other complications of procedures, not elsewhere classified, initial encounter: Secondary | ICD-10-CM | POA: Diagnosis not present

## 2017-04-19 DIAGNOSIS — Z951 Presence of aortocoronary bypass graft: Secondary | ICD-10-CM | POA: Diagnosis not present

## 2017-04-19 DIAGNOSIS — L89620 Pressure ulcer of left heel, unstageable: Secondary | ICD-10-CM | POA: Diagnosis not present

## 2017-04-19 DIAGNOSIS — I119 Hypertensive heart disease without heart failure: Secondary | ICD-10-CM | POA: Diagnosis not present

## 2017-04-19 DIAGNOSIS — G4700 Insomnia, unspecified: Secondary | ICD-10-CM | POA: Diagnosis not present

## 2017-04-19 DIAGNOSIS — I2510 Atherosclerotic heart disease of native coronary artery without angina pectoris: Secondary | ICD-10-CM | POA: Diagnosis not present

## 2017-04-19 DIAGNOSIS — I252 Old myocardial infarction: Secondary | ICD-10-CM | POA: Diagnosis not present

## 2017-04-19 DIAGNOSIS — I890 Lymphedema, not elsewhere classified: Secondary | ICD-10-CM | POA: Diagnosis not present

## 2017-04-19 DIAGNOSIS — Z79891 Long term (current) use of opiate analgesic: Secondary | ICD-10-CM | POA: Diagnosis not present

## 2017-04-19 DIAGNOSIS — M14672 Charcot's joint, left ankle and foot: Secondary | ICD-10-CM | POA: Diagnosis not present

## 2017-04-19 DIAGNOSIS — M869 Osteomyelitis, unspecified: Secondary | ICD-10-CM | POA: Diagnosis not present

## 2017-04-20 DIAGNOSIS — G8929 Other chronic pain: Secondary | ICD-10-CM | POA: Diagnosis not present

## 2017-04-20 DIAGNOSIS — I890 Lymphedema, not elsewhere classified: Secondary | ICD-10-CM | POA: Diagnosis not present

## 2017-04-20 DIAGNOSIS — M14672 Charcot's joint, left ankle and foot: Secondary | ICD-10-CM | POA: Diagnosis not present

## 2017-04-20 DIAGNOSIS — E785 Hyperlipidemia, unspecified: Secondary | ICD-10-CM | POA: Diagnosis not present

## 2017-04-20 DIAGNOSIS — M86172 Other acute osteomyelitis, left ankle and foot: Secondary | ICD-10-CM | POA: Diagnosis not present

## 2017-04-20 DIAGNOSIS — I252 Old myocardial infarction: Secondary | ICD-10-CM | POA: Diagnosis not present

## 2017-04-20 DIAGNOSIS — L89620 Pressure ulcer of left heel, unstageable: Secondary | ICD-10-CM | POA: Diagnosis not present

## 2017-04-20 DIAGNOSIS — I2510 Atherosclerotic heart disease of native coronary artery without angina pectoris: Secondary | ICD-10-CM | POA: Diagnosis not present

## 2017-04-20 DIAGNOSIS — M79672 Pain in left foot: Secondary | ICD-10-CM | POA: Diagnosis not present

## 2017-04-20 DIAGNOSIS — G4700 Insomnia, unspecified: Secondary | ICD-10-CM | POA: Diagnosis not present

## 2017-04-20 DIAGNOSIS — Z79891 Long term (current) use of opiate analgesic: Secondary | ICD-10-CM | POA: Diagnosis not present

## 2017-04-20 DIAGNOSIS — Z4801 Encounter for change or removal of surgical wound dressing: Secondary | ICD-10-CM | POA: Diagnosis not present

## 2017-04-20 DIAGNOSIS — Z7982 Long term (current) use of aspirin: Secondary | ICD-10-CM | POA: Diagnosis not present

## 2017-04-20 DIAGNOSIS — Z951 Presence of aortocoronary bypass graft: Secondary | ICD-10-CM | POA: Diagnosis not present

## 2017-04-20 DIAGNOSIS — I119 Hypertensive heart disease without heart failure: Secondary | ICD-10-CM | POA: Diagnosis not present

## 2017-04-21 ENCOUNTER — Ambulatory Visit (INDEPENDENT_AMBULATORY_CARE_PROVIDER_SITE_OTHER): Payer: Medicare Other | Admitting: Orthopedic Surgery

## 2017-04-22 ENCOUNTER — Telehealth: Payer: Self-pay | Admitting: *Deleted

## 2017-04-22 DIAGNOSIS — Z79891 Long term (current) use of opiate analgesic: Secondary | ICD-10-CM | POA: Diagnosis not present

## 2017-04-22 DIAGNOSIS — M86172 Other acute osteomyelitis, left ankle and foot: Secondary | ICD-10-CM | POA: Diagnosis not present

## 2017-04-22 DIAGNOSIS — I252 Old myocardial infarction: Secondary | ICD-10-CM | POA: Diagnosis not present

## 2017-04-22 DIAGNOSIS — I890 Lymphedema, not elsewhere classified: Secondary | ICD-10-CM | POA: Diagnosis not present

## 2017-04-22 DIAGNOSIS — Z4801 Encounter for change or removal of surgical wound dressing: Secondary | ICD-10-CM | POA: Diagnosis not present

## 2017-04-22 DIAGNOSIS — L89620 Pressure ulcer of left heel, unstageable: Secondary | ICD-10-CM | POA: Diagnosis not present

## 2017-04-22 DIAGNOSIS — M14672 Charcot's joint, left ankle and foot: Secondary | ICD-10-CM | POA: Diagnosis not present

## 2017-04-22 DIAGNOSIS — M79672 Pain in left foot: Secondary | ICD-10-CM | POA: Diagnosis not present

## 2017-04-22 DIAGNOSIS — I2510 Atherosclerotic heart disease of native coronary artery without angina pectoris: Secondary | ICD-10-CM | POA: Diagnosis not present

## 2017-04-22 DIAGNOSIS — I119 Hypertensive heart disease without heart failure: Secondary | ICD-10-CM | POA: Diagnosis not present

## 2017-04-22 DIAGNOSIS — E785 Hyperlipidemia, unspecified: Secondary | ICD-10-CM | POA: Diagnosis not present

## 2017-04-22 DIAGNOSIS — Z7982 Long term (current) use of aspirin: Secondary | ICD-10-CM | POA: Diagnosis not present

## 2017-04-22 DIAGNOSIS — G4700 Insomnia, unspecified: Secondary | ICD-10-CM | POA: Diagnosis not present

## 2017-04-22 DIAGNOSIS — Z951 Presence of aortocoronary bypass graft: Secondary | ICD-10-CM | POA: Diagnosis not present

## 2017-04-22 DIAGNOSIS — G8929 Other chronic pain: Secondary | ICD-10-CM | POA: Diagnosis not present

## 2017-04-22 MED ORDER — SERTRALINE HCL 50 MG PO TABS: 50.0000 mg | ORAL_TABLET | Freq: Every day | ORAL | 0 refills | Status: DC

## 2017-04-22 NOTE — Telephone Encounter (Signed)
Pt notified of RX Will call back to schedule

## 2017-04-22 NOTE — Telephone Encounter (Signed)
Sent in #30 tabs. Must be seen for eval and refills, not able to do more than that until office visit. Can you schedule her f/u with PCP?

## 2017-04-22 NOTE — Telephone Encounter (Signed)
Pt called requesting refill on Sentraline 50mg  Not on pt's med list Pt states she was put on med while in rehab for foot Pt unable to come in for appt due to inability to walk and transportation issues Pt states med is working well Can med be refilled until pt is able to come in for appt

## 2017-04-26 ENCOUNTER — Telehealth (INDEPENDENT_AMBULATORY_CARE_PROVIDER_SITE_OTHER): Payer: Self-pay | Admitting: Orthopedic Surgery

## 2017-04-26 DIAGNOSIS — G4700 Insomnia, unspecified: Secondary | ICD-10-CM | POA: Diagnosis not present

## 2017-04-26 DIAGNOSIS — Z7982 Long term (current) use of aspirin: Secondary | ICD-10-CM | POA: Diagnosis not present

## 2017-04-26 DIAGNOSIS — M14672 Charcot's joint, left ankle and foot: Secondary | ICD-10-CM | POA: Diagnosis not present

## 2017-04-26 DIAGNOSIS — I890 Lymphedema, not elsewhere classified: Secondary | ICD-10-CM | POA: Diagnosis not present

## 2017-04-26 DIAGNOSIS — G8929 Other chronic pain: Secondary | ICD-10-CM | POA: Diagnosis not present

## 2017-04-26 DIAGNOSIS — M86172 Other acute osteomyelitis, left ankle and foot: Secondary | ICD-10-CM | POA: Diagnosis not present

## 2017-04-26 DIAGNOSIS — I252 Old myocardial infarction: Secondary | ICD-10-CM | POA: Diagnosis not present

## 2017-04-26 DIAGNOSIS — E785 Hyperlipidemia, unspecified: Secondary | ICD-10-CM | POA: Diagnosis not present

## 2017-04-26 DIAGNOSIS — I119 Hypertensive heart disease without heart failure: Secondary | ICD-10-CM | POA: Diagnosis not present

## 2017-04-26 DIAGNOSIS — L89620 Pressure ulcer of left heel, unstageable: Secondary | ICD-10-CM | POA: Diagnosis not present

## 2017-04-26 DIAGNOSIS — Z79891 Long term (current) use of opiate analgesic: Secondary | ICD-10-CM | POA: Diagnosis not present

## 2017-04-26 DIAGNOSIS — I2510 Atherosclerotic heart disease of native coronary artery without angina pectoris: Secondary | ICD-10-CM | POA: Diagnosis not present

## 2017-04-26 DIAGNOSIS — Z4801 Encounter for change or removal of surgical wound dressing: Secondary | ICD-10-CM | POA: Diagnosis not present

## 2017-04-26 DIAGNOSIS — Z951 Presence of aortocoronary bypass graft: Secondary | ICD-10-CM | POA: Diagnosis not present

## 2017-04-26 DIAGNOSIS — M79672 Pain in left foot: Secondary | ICD-10-CM | POA: Diagnosis not present

## 2017-04-26 NOTE — Telephone Encounter (Signed)
Ok change to dry dressing and we will evaluate at follow up

## 2017-04-26 NOTE — Telephone Encounter (Signed)
Morey Hummingbird, from [facility] called stating that the patient has been using the Silver Sulfadiazine and now her healthy tissue is coming up and the wound is not closing like it should.  She wanted to know if Dr. Sharol Given would want them to go back to dry dressing or continue with what they are doing.  Their next scheduled visit is next week.  They are able to go see again before then if Dr. Sharol Given would like them to.  CB#[phone].  Thank you.

## 2017-04-26 NOTE — Telephone Encounter (Signed)
Please see below and advise.

## 2017-04-27 DIAGNOSIS — I890 Lymphedema, not elsewhere classified: Secondary | ICD-10-CM | POA: Diagnosis not present

## 2017-04-27 DIAGNOSIS — Z951 Presence of aortocoronary bypass graft: Secondary | ICD-10-CM | POA: Diagnosis not present

## 2017-04-27 DIAGNOSIS — E785 Hyperlipidemia, unspecified: Secondary | ICD-10-CM | POA: Diagnosis not present

## 2017-04-27 DIAGNOSIS — L89620 Pressure ulcer of left heel, unstageable: Secondary | ICD-10-CM | POA: Diagnosis not present

## 2017-04-27 DIAGNOSIS — Z4801 Encounter for change or removal of surgical wound dressing: Secondary | ICD-10-CM | POA: Diagnosis not present

## 2017-04-27 DIAGNOSIS — M14672 Charcot's joint, left ankle and foot: Secondary | ICD-10-CM | POA: Diagnosis not present

## 2017-04-27 DIAGNOSIS — I252 Old myocardial infarction: Secondary | ICD-10-CM | POA: Diagnosis not present

## 2017-04-27 DIAGNOSIS — G4700 Insomnia, unspecified: Secondary | ICD-10-CM | POA: Diagnosis not present

## 2017-04-27 DIAGNOSIS — Z7982 Long term (current) use of aspirin: Secondary | ICD-10-CM | POA: Diagnosis not present

## 2017-04-27 DIAGNOSIS — M86172 Other acute osteomyelitis, left ankle and foot: Secondary | ICD-10-CM | POA: Diagnosis not present

## 2017-04-27 DIAGNOSIS — I2510 Atherosclerotic heart disease of native coronary artery without angina pectoris: Secondary | ICD-10-CM | POA: Diagnosis not present

## 2017-04-27 DIAGNOSIS — Z79891 Long term (current) use of opiate analgesic: Secondary | ICD-10-CM | POA: Diagnosis not present

## 2017-04-27 DIAGNOSIS — M79672 Pain in left foot: Secondary | ICD-10-CM | POA: Diagnosis not present

## 2017-04-27 DIAGNOSIS — G8929 Other chronic pain: Secondary | ICD-10-CM | POA: Diagnosis not present

## 2017-04-27 DIAGNOSIS — I119 Hypertensive heart disease without heart failure: Secondary | ICD-10-CM | POA: Diagnosis not present

## 2017-04-27 NOTE — Telephone Encounter (Signed)
Called and advised of message below. To call with questions.

## 2017-04-28 DIAGNOSIS — G8929 Other chronic pain: Secondary | ICD-10-CM | POA: Diagnosis not present

## 2017-04-28 DIAGNOSIS — I890 Lymphedema, not elsewhere classified: Secondary | ICD-10-CM | POA: Diagnosis not present

## 2017-04-28 DIAGNOSIS — M14672 Charcot's joint, left ankle and foot: Secondary | ICD-10-CM | POA: Diagnosis not present

## 2017-04-28 DIAGNOSIS — Z79891 Long term (current) use of opiate analgesic: Secondary | ICD-10-CM | POA: Diagnosis not present

## 2017-04-28 DIAGNOSIS — M79672 Pain in left foot: Secondary | ICD-10-CM | POA: Diagnosis not present

## 2017-04-28 DIAGNOSIS — I119 Hypertensive heart disease without heart failure: Secondary | ICD-10-CM | POA: Diagnosis not present

## 2017-04-28 DIAGNOSIS — G4700 Insomnia, unspecified: Secondary | ICD-10-CM | POA: Diagnosis not present

## 2017-04-28 DIAGNOSIS — I2510 Atherosclerotic heart disease of native coronary artery without angina pectoris: Secondary | ICD-10-CM | POA: Diagnosis not present

## 2017-04-28 DIAGNOSIS — M86172 Other acute osteomyelitis, left ankle and foot: Secondary | ICD-10-CM | POA: Diagnosis not present

## 2017-04-28 DIAGNOSIS — Z951 Presence of aortocoronary bypass graft: Secondary | ICD-10-CM | POA: Diagnosis not present

## 2017-04-28 DIAGNOSIS — Z7982 Long term (current) use of aspirin: Secondary | ICD-10-CM | POA: Diagnosis not present

## 2017-04-28 DIAGNOSIS — L89620 Pressure ulcer of left heel, unstageable: Secondary | ICD-10-CM | POA: Diagnosis not present

## 2017-04-28 DIAGNOSIS — I252 Old myocardial infarction: Secondary | ICD-10-CM | POA: Diagnosis not present

## 2017-04-28 DIAGNOSIS — Z4801 Encounter for change or removal of surgical wound dressing: Secondary | ICD-10-CM | POA: Diagnosis not present

## 2017-04-28 DIAGNOSIS — E785 Hyperlipidemia, unspecified: Secondary | ICD-10-CM | POA: Diagnosis not present

## 2017-04-29 DIAGNOSIS — I119 Hypertensive heart disease without heart failure: Secondary | ICD-10-CM | POA: Diagnosis not present

## 2017-04-29 DIAGNOSIS — I2510 Atherosclerotic heart disease of native coronary artery without angina pectoris: Secondary | ICD-10-CM | POA: Diagnosis not present

## 2017-04-29 DIAGNOSIS — Z4801 Encounter for change or removal of surgical wound dressing: Secondary | ICD-10-CM | POA: Diagnosis not present

## 2017-04-29 DIAGNOSIS — M14672 Charcot's joint, left ankle and foot: Secondary | ICD-10-CM | POA: Diagnosis not present

## 2017-04-29 DIAGNOSIS — Z951 Presence of aortocoronary bypass graft: Secondary | ICD-10-CM | POA: Diagnosis not present

## 2017-04-29 DIAGNOSIS — I890 Lymphedema, not elsewhere classified: Secondary | ICD-10-CM | POA: Diagnosis not present

## 2017-04-29 DIAGNOSIS — M86172 Other acute osteomyelitis, left ankle and foot: Secondary | ICD-10-CM | POA: Diagnosis not present

## 2017-04-29 DIAGNOSIS — L89620 Pressure ulcer of left heel, unstageable: Secondary | ICD-10-CM | POA: Diagnosis not present

## 2017-04-29 DIAGNOSIS — Z7982 Long term (current) use of aspirin: Secondary | ICD-10-CM | POA: Diagnosis not present

## 2017-04-29 DIAGNOSIS — M79672 Pain in left foot: Secondary | ICD-10-CM | POA: Diagnosis not present

## 2017-04-29 DIAGNOSIS — E785 Hyperlipidemia, unspecified: Secondary | ICD-10-CM | POA: Diagnosis not present

## 2017-04-29 DIAGNOSIS — G4700 Insomnia, unspecified: Secondary | ICD-10-CM | POA: Diagnosis not present

## 2017-04-29 DIAGNOSIS — Z79891 Long term (current) use of opiate analgesic: Secondary | ICD-10-CM | POA: Diagnosis not present

## 2017-04-29 DIAGNOSIS — I252 Old myocardial infarction: Secondary | ICD-10-CM | POA: Diagnosis not present

## 2017-04-29 DIAGNOSIS — G8929 Other chronic pain: Secondary | ICD-10-CM | POA: Diagnosis not present

## 2017-05-02 ENCOUNTER — Ambulatory Visit (INDEPENDENT_AMBULATORY_CARE_PROVIDER_SITE_OTHER): Payer: Medicare Other

## 2017-05-02 ENCOUNTER — Encounter (INDEPENDENT_AMBULATORY_CARE_PROVIDER_SITE_OTHER): Payer: Self-pay | Admitting: Orthopedic Surgery

## 2017-05-02 ENCOUNTER — Ambulatory Visit (INDEPENDENT_AMBULATORY_CARE_PROVIDER_SITE_OTHER): Payer: Medicare Other | Admitting: Orthopedic Surgery

## 2017-05-02 VITALS — Ht 66.0 in | Wt 280.0 lb

## 2017-05-02 DIAGNOSIS — M79672 Pain in left foot: Secondary | ICD-10-CM | POA: Diagnosis not present

## 2017-05-02 DIAGNOSIS — I70262 Atherosclerosis of native arteries of extremities with gangrene, left leg: Secondary | ICD-10-CM

## 2017-05-02 DIAGNOSIS — T8131XS Disruption of external operation (surgical) wound, not elsewhere classified, sequela: Secondary | ICD-10-CM

## 2017-05-02 NOTE — Progress Notes (Signed)
Office Visit Note   Patient: Julia West           Date of Birth: 29-May-1955           MRN: [id number] Visit Date: 05/02/2017              Requested by: [name], MD [place], [place] [address] PCP: [name], MD  Chief Complaint  Patient presents with  . Left Foot - Routine Post Op    04/11/17 excision talar head internal fixation  Medial column      HPI: Patient is a 62 year old woman who is approximately 2-1/2 months status post reconstruction of the medial column left foot secondary to Charcot collapse and arthropathy.  Patient has had maintenance of the medial column without hardware failure however she has had progressive wound dehiscence proximally and distally with 2 small wounds that now communicate with the hardware.  Patient is also had progressive increasing black gangrenous dry gangrene of the left heel ulcer despite being in a PRAFO boot for pressure unloading.  Assessment & Plan: Visit Diagnoses:  1. Pain in left foot   2. Dehiscence of operative wound, sequela   3. Atherosclerosis of native artery of left lower extremity with gangrene ([place])     Plan: Discussed with the patient that we could continue proceeding with limb salvage intervention with removal of the deep hardware debridement of the eschar skin grafting and wound VAC treatment.  Discussed that with her progressive gangrenous changes of the heel I am concerned that the microcirculation is insufficient for [place] to have adequate healing of the revision surgery.  Discussed that with patient's current limitations lack of mobility that her best option would be to proceed with a transtibial amputation.  We would have patient discharged to skilled nursing postoperatively anticipate return to home after that time and most likely could fit her for a prosthesis in approximately 4 months.  Discussed that continued limb salvage intervention is an option but the risks of complications with the  revision surgery are increased.  Follow-Up Instructions: Return in about 2 weeks (around 05/16/2017).   Ortho Exam  Patient is alert, oriented, no adenopathy, well-dressed, normal affect, normal respiratory effort. Examination patient is ambulating in a wheelchair.  The black decubitus heel ulcer is larger it is 4 cm in diameter there is no drainage no cellulitis no signs of infection.  The medial incision shows no redness no cellulitis.  The proximal and distal wounds are larger.  These are probed with a probe all the way down to the hardware and there is a large soft tissue void beneath these wounds.  There is no ascending cellulitis no purulent drainage.  Imaging: Xr Foot Complete Left  Result Date: 05/02/2017 3 view radiographs of the left foot shows restoration of the alignment of the medial column no hardware failure no loss of reduction no rocker-bottom deformity.  No images are attached to the encounter.  Labs: Lab Results  Component Value Date   HGBA1C 5.6 02/07/2017   REPTSTATUS 04/29/2012 FINAL 04/24/2012   REPTSTATUS 04/27/2012 FINAL 04/24/2012   GRAMSTAIN  04/24/2012    NO WBC SEEN NO SQUAMOUS EPITHELIAL CELLS SEEN MODERATE GRAM POSITIVE COCCI IN PAIRS RARE GRAM NEGATIVE RODS   GRAMSTAIN  04/24/2012    NO WBC SEEN NO SQUAMOUS EPITHELIAL CELLS SEEN MODERATE GRAM POSITIVE COCCI IN PAIRS RARE GRAM NEGATIVE RODS   CULT NO ANAEROBES ISOLATED 04/24/2012   CULT MODERATE PROTEUS MIRABILIS 04/24/2012  LABORGA PROTEUS MIRABILIS 04/24/2012    @LABSALLVALUES (HGBA1)@  Body mass index is 45.19 kg/m.  Orders:  Orders Placed This Encounter  Procedures  . XR Foot Complete Left   No orders of the defined types were placed in this encounter.    Procedures: No procedures performed  Clinical Data: No additional findings.  ROS:  All other systems negative, except as noted in the HPI. Review of Systems  Objective: Vital Signs: Ht 5\' 6"  (1.676 m)   Wt 280 lb (127  kg)   BMI 45.19 kg/m   Specialty Comments:  No specialty comments available.  PMFS History: Patient Active Problem List   Diagnosis Date Noted  . Charcot foot due to diabetes mellitus ([place]) 02/09/2017  . 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 Post-menopausal bleeding 04/15/2014  . Endometrial polyp 04/09/2014  . Ventral hernia 03/06/2012  . Open abdominal wall wound 03/06/2012  . Hyperlipemia 11/12/2008  . OBESITY, MORBID 11/12/2008  . Essential hypertension 11/12/2008  . CAD 11/12/2008   Past Medical History:  Diagnosis Date  . Acute myocardial infarction    with a ruptured plaque in the circumflex in 2003  . Anxiety   . Cancer ([place])    basal cell face  . Complication of anesthesia    states low O2 sats post-op 11/13, always slow to awaken  . Coronary artery disease   . Degenerative joint disease   . Dyspnea    with activity- car to house - steps  . Heart murmur    "not to be concerned"  . HTN (hypertension)   . Hyperlipidemia LDL goal <70   . Internal and external hemorrhoids without complication   . Lymphedema    right side of body  . Lymphedema of arm    right  . Morbid obesity ([place])   . Surgical wound, non healing ABDOMINAL    has wound vac @ 125 mm Hg    Family History  Problem Relation Age of Onset  . Heart attack Mother   . Hypertension Sister   . Diabetes Brother   . Birth defects Son        heart defect    Past Surgical History:  Procedure Laterality Date  . APPLICATION OF WOUND VAC    . BOWEL RESECTION  02/07/2012   Procedure: SMALL BOWEL RESECTION;  Surgeon: Gayland Curry, MD,FACS;  Location: [facility];  Service: General;;  . CARDIOVASCULAR STRESS TEST  01-17-2012  DR HOCHREIN   LOW RISK NUCLEAR TEST  . CESAREAN SECTION     x 4 in remote past  . CHOLECYSTECTOMY    . CORONARY ARTERY BYPASS GRAFT  2003   by Dr. Servando Snare. LIMA to the LAD, free RIMA to the circumflex. Stress perfusion study December 2009 with no  high-risk areas of ischemia. She has a well-preserved ejection fraction  . HYSTEROSCOPY W/D&C N/A 05/14/2014   Procedure: DILATATION AND CURETTAGE (no specimen); HYSTEROSCOPY;  Surgeon: Jonnie Kind, MD;  Location: [facility];  Service: Gynecology;  Laterality: N/A;  . INCISION AND DRAINAGE OF WOUND  04/17/2012   Procedure: IRRIGATION AND DEBRIDEMENT WOUND;  Surgeon: Theodoro Kos, DO;  Location: [facility];  Service: Plastics;  Laterality: N/A;  OF ABDOMINAL WOUND, SURGICAL PREP AND PLACEMENT OF VAC, REMOVAL FOREHEAD SKIN LESION  . INCISION AND DRAINAGE OF WOUND N/A 04/24/2012   Procedure: IRRIGATION AND DEBRIDEMENT WOUND;  Surgeon: Theodoro Kos, DO;  Location: [facility];  Service:  Plastics;  Laterality: N/A;  I & D ABDOMINAL WOUND WITH VAC AND ACELL  . INCISION AND DRAINAGE OF WOUND N/A 05/01/2012   Procedure: IRRIGATION AND DEBRIDEMENT WOUND;  Surgeon: Theodoro Kos, DO;  Location: [facility];  Service: Plastics;  Laterality: N/A;  WITH SURGICAL PREP AND PLACEMENT OF VAC  . INCISION AND DRAINAGE OF WOUND N/A 05/08/2012   Procedure: IRRIGATION AND DEBRIDEMENT OF ABD WOUND SURGICAL PREP AND PLACEMENT OF VAC ;  Surgeon: Theodoro Kos, DO;  Location: [facility];  Service: Plastics;  Laterality: N/A;  IRRIGATION AND DEBRIDEMENT OF ABD WOUND SURGICAL PREP AND PLACEMENT OF VAC   . INCISION AND DRAINAGE OF WOUND N/A 05/15/2012   Procedure: IRRIGATION AND DEBRIDEMENT OF ABDOMINAL WOUND WITH POSSIBLE SURGICAL PREP AND PLACEMENT OF VAC;  Surgeon: Theodoro Kos, DO;  Location: [facility];  Service: Plastics;  Laterality: N/A;  . INCISION AND DRAINAGE OF WOUND N/A 05/22/2012   Procedure: IRRIGATION AND DEBRIDEMENT OF ABODOMINAL WOUND WITH  SURGICAL PREP AND VAC PLACEMENT;  Surgeon: Theodoro Kos, DO;  Location: [facility];  Service: Plastics;  Laterality: N/A;  . INCISION AND DRAINAGE OF WOUND N/A 06/28/2012   Procedure:  IRRIGATION AND DEBRIDEMENT OF ABDOMINAL ULCER SURGICAL PREP AND PLACEMENT OF ACELL AND VAC;  Surgeon: Theodoro Kos, DO;  Location: [facility];  Service: Plastics;  Laterality: N/A;  . INCISIONAL HERNIA REPAIR  02/07/2012   Procedure: HERNIA REPAIR INCISIONAL;  Surgeon: Gayland Curry, MD,FACS;  Location: [facility];  Service: General;;  Open, Primary repair, strangulated Incisional hernia.  Marland Kitchen LESION REMOVAL  04/17/2012   Procedure: LESION REMOVAL;  Surgeon: Theodoro Kos, DO;  Location: [facility];  Service: Plastics;  Laterality: N/A;   CENTER OF FOREHEAD, REMOVAL FORHEAD SKIN LESION  . ORIF TOE FRACTURE Left 02/09/2017   Procedure: EXCISION TALAR HEAD, INTERNAL FIXATION MEDIAL COLUMN LEFT FOOT;  Surgeon: Newt Minion, MD;  Location: [facility];  Service: Orthopedics;  Laterality: Left;  . POLYPECTOMY N/A 05/14/2014   Procedure: ENDOMETRIAL POLYPECTOMY;  Surgeon: Jonnie Kind, MD;  Location: [facility];  Service: Gynecology;  Laterality: N/A;   Social History   Occupational History  . Occupation: Estate manager/land agent    Comment: In the [employer]  Tobacco Use  . Smoking status: Former Smoker    Packs/day: 1.00    Years: 25.00    Pack years: 25.00    Types: Cigarettes    Last attempt to quit: 11/17/2011    Years since quitting: 5.4  . Smokeless tobacco: Never Used  Substance and Sexual Activity  . Alcohol use: No  . Drug use: No  . Sexual activity: Yes    Birth control/protection: Post-menopausal

## 2017-05-03 ENCOUNTER — Telehealth: Payer: Self-pay | Admitting: Family Medicine

## 2017-05-03 DIAGNOSIS — G8929 Other chronic pain: Secondary | ICD-10-CM | POA: Diagnosis not present

## 2017-05-03 DIAGNOSIS — I2510 Atherosclerotic heart disease of native coronary artery without angina pectoris: Secondary | ICD-10-CM | POA: Diagnosis not present

## 2017-05-03 DIAGNOSIS — Z4801 Encounter for change or removal of surgical wound dressing: Secondary | ICD-10-CM | POA: Diagnosis not present

## 2017-05-03 DIAGNOSIS — E785 Hyperlipidemia, unspecified: Secondary | ICD-10-CM | POA: Diagnosis not present

## 2017-05-03 DIAGNOSIS — I890 Lymphedema, not elsewhere classified: Secondary | ICD-10-CM | POA: Diagnosis not present

## 2017-05-03 DIAGNOSIS — L89620 Pressure ulcer of left heel, unstageable: Secondary | ICD-10-CM | POA: Diagnosis not present

## 2017-05-03 DIAGNOSIS — Z951 Presence of aortocoronary bypass graft: Secondary | ICD-10-CM | POA: Diagnosis not present

## 2017-05-03 DIAGNOSIS — G4700 Insomnia, unspecified: Secondary | ICD-10-CM | POA: Diagnosis not present

## 2017-05-03 DIAGNOSIS — I119 Hypertensive heart disease without heart failure: Secondary | ICD-10-CM | POA: Diagnosis not present

## 2017-05-03 DIAGNOSIS — Z7982 Long term (current) use of aspirin: Secondary | ICD-10-CM | POA: Diagnosis not present

## 2017-05-03 DIAGNOSIS — Z79891 Long term (current) use of opiate analgesic: Secondary | ICD-10-CM | POA: Diagnosis not present

## 2017-05-03 DIAGNOSIS — M86172 Other acute osteomyelitis, left ankle and foot: Secondary | ICD-10-CM | POA: Diagnosis not present

## 2017-05-03 DIAGNOSIS — M79672 Pain in left foot: Secondary | ICD-10-CM | POA: Diagnosis not present

## 2017-05-03 DIAGNOSIS — I252 Old myocardial infarction: Secondary | ICD-10-CM | POA: Diagnosis not present

## 2017-05-03 DIAGNOSIS — M14672 Charcot's joint, left ankle and foot: Secondary | ICD-10-CM | POA: Diagnosis not present

## 2017-05-03 NOTE — Telephone Encounter (Signed)
Spoke to pt and advised we can't order labs for inpatient hospital as our providers don't have hospital privileges and can't do hospital orders. Pt voiced understanding.

## 2017-05-04 ENCOUNTER — Telehealth (INDEPENDENT_AMBULATORY_CARE_PROVIDER_SITE_OTHER): Payer: Self-pay | Admitting: Orthopedic Surgery

## 2017-05-04 ENCOUNTER — Encounter (HOSPITAL_COMMUNITY): Payer: Self-pay

## 2017-05-04 ENCOUNTER — Other Ambulatory Visit (INDEPENDENT_AMBULATORY_CARE_PROVIDER_SITE_OTHER): Payer: Self-pay | Admitting: Family

## 2017-05-04 NOTE — Progress Notes (Signed)
PCP: [name], MD  Cardiologist: Dr.  Josefa Half  EKG:m 01/2017  Stress test: 01/17/2012  ECHO: pt denies  Cardiac Cath: pt denies  Chest x-ray:denies past year, no recent respiratory infections.complications  Pt had CABG in 2003-anesthesia to review chart due to heart hx

## 2017-05-04 NOTE — Telephone Encounter (Signed)
Patient called stating that she has some questions in regards to some information that she read on her MyChart.  CB#[phone].  Thank you.

## 2017-05-04 NOTE — Progress Notes (Signed)
Anesthesia Chart Review: SAME DAY WORK-UP.  Patient is a 62 year old female scheduled for hardware removal left foot versus BKA on 05/06/17 by Dr. Meridee Score.   History includes former smoker (quit '13), MI (ruptured CX plaque) '03 s/p CABG (LIMA-LAD, free RIMA-CX) 04/11/01, HTN, HLD, anxiety, right lymphedema, exertional dyspnea, murmur, skin cancer ([facility]), cholecystectomy, incarcerated ventral hernia s/p hernia repair and small bowel resection 62/95/28 (complicated by wound dehiscence, s/p multiple I&D/Acell/Wound VAC procedures), excision talar head/internal fixation medial column left foot 02/09/17. Reports she had post-operative hypoxemia after 01/2012 and is slow to awaken from anesthesia. BMI is consistent with morbid obesity.   - PCP is listed as Dr. [name]. - Cardiologist is Dr. [name]. Patient was seen by Rosaria Ferries, PA-C on 02/08/17 for preoperative evaluation prior to left foot surgery (done 02/08/17). She was felt slightly higher risk, but acceptable risk for that surgery. B-blocker decreased at that time due to bradycardia.  Meds include ASA 81 mg, Wellbutrin, fenofibrate, Lasix, Norco, Lopressor, Nitro, Zoloft, tramadol, trazodone, Diovan-HCT.  EKG 02/07/17: SB at 48 bpm, LVH.  Nuclear stress test 01/17/12: IMPRESSION: Equivocal pharmacologic stress nuclear myocardial study revealing mild left ventricular dilatation, a small segmental wall motion abnormality with a normal ejection fraction, and no significant stress-induced EKG abnormalities. On scintigraphic imaging, there were questionable small areas of ischemia in the distal inferolateral and basilar inferior segments, perhaps with some superimposed scarring. Other findings as noted. (Dr. Percival Spanish reviewed and felt that overall scan was low risk. Patient was asymptomatic. He cleared patient for a surgical procedure at that time.)  Echo 04/12/02: FINDINGS/IMPRESSION: 1. A technically difficult and  somewhat limited echocardiographic study. 2. Normal left atrium, right atrium and right ventricle. 3. Normal mitral and tricuspid valves; aortic valve not well imaged, but probably normal. 4. LV systolic function assessed with the assistance of intravenous echocardiographic contrast. 5. Left ventricular size was normal. There was borderline LVH. Not all myocardial segments adequately imaged--no areas of akinesis nor dyskinesis. Overall LV systolic function appeared normal.  Last LHC seen was prior to 2003 CABG.  Currently last labs seen are from 02/07/17. (Message left with Malachy Mood at Dr. Jess Barters office to fax or scan more recent labs if available.) A1c 5.6 on 02/07/17.   Patient is a same day work-up, so further evaluation on the day of surgery. However, if no acute changes and if labs acceptable then I would anticipate that she can proceed as planned.  George Hugh [facility] Short Stay Center/Anesthesiology Phone [phone] 05/04/2017 4:45 PM

## 2017-05-04 NOTE — Telephone Encounter (Signed)
I called patient and discussed her treatment options and also answered all questions.  Discussed that the dry eschar on the heel is dry gangrene and do not know how deep this extends.  Discussed the importance of proceeding with skilled nursing care postoperatively.  Discussed treatment options and recommendations to proceed with a transtibial amputation.  Feel that this would be the quickest way to get the patient back up on her feet without problems with wound healing from further foot salvage treatment.

## 2017-05-04 NOTE — Telephone Encounter (Signed)
Pt's daughter also calling to discuss surgery.

## 2017-05-04 NOTE — Telephone Encounter (Signed)
After talking with the patient she wanted me to call her daughter.  I discussed with the daughter the black eschar being dry gangrene over the heel we discussed patient having a borderline elevated glucose with renal insufficiency.  Will obtain a glucose tolerance test during hospitalization and also use Neurontin for neuropathic pain plan for discharge to skilled nursing.

## 2017-05-04 NOTE — Telephone Encounter (Signed)
Patients daughter called inquiring about the amputation patient is supposed to be having on Friday, she was told to call with any questions she had about this procedure. Please advise Dianne # [phone]

## 2017-05-04 NOTE — Telephone Encounter (Signed)
For you to call and discuss with pt. Having a BKA

## 2017-05-05 ENCOUNTER — Telehealth (INDEPENDENT_AMBULATORY_CARE_PROVIDER_SITE_OTHER): Payer: Self-pay | Admitting: Orthopedic Surgery

## 2017-05-05 MED ORDER — DEXTROSE 5 % IV SOLN
3.0000 g | INTRAVENOUS | Status: DC
  Filled 2017-05-05: qty 3000

## 2017-05-05 NOTE — Telephone Encounter (Signed)
Devon from [facility] left a message stating that the patient is almost at her maximum usage for Skilled Nursing.  She stated that the patient is having surgery tomorrow.  Devon wanted to know if Dr. Sharol Given is going to write an order for Long-term care or Acute Rehab.  This does not count toward the patient's maximum days for Medicare.  Devon's number is [phone] ext. [phone].  Thank you.

## 2017-05-06 ENCOUNTER — Inpatient Hospital Stay (HOSPITAL_COMMUNITY)
Admission: RE | Admit: 2017-05-06 | Discharge: 2017-05-11 | DRG: 908 | Disposition: A | Discharge status: Skilled Nursing Facility | Payer: Medicare Other | Source: Ambulatory Visit | Attending: Orthopedic Surgery | Admitting: Orthopedic Surgery

## 2017-05-06 ENCOUNTER — Inpatient Hospital Stay (HOSPITAL_COMMUNITY): Payer: Medicare Other | Admitting: Vascular Surgery

## 2017-05-06 ENCOUNTER — Encounter (HOSPITAL_COMMUNITY): Payer: Self-pay | Admitting: *Deleted

## 2017-05-06 ENCOUNTER — Encounter (HOSPITAL_COMMUNITY)
Admission: RE | Disposition: A | Discharge status: Skilled Nursing Facility | Payer: Self-pay | Source: Ambulatory Visit | Attending: Orthopedic Surgery

## 2017-05-06 DIAGNOSIS — F419 Anxiety disorder, unspecified: Secondary | ICD-10-CM | POA: Diagnosis present

## 2017-05-06 DIAGNOSIS — Z87891 Personal history of nicotine dependence: Secondary | ICD-10-CM | POA: Diagnosis not present

## 2017-05-06 DIAGNOSIS — I890 Lymphedema, not elsewhere classified: Secondary | ICD-10-CM

## 2017-05-06 DIAGNOSIS — T8133XA Disruption of traumatic injury wound repair, initial encounter: Secondary | ICD-10-CM | POA: Diagnosis not present

## 2017-05-06 DIAGNOSIS — G8918 Other acute postprocedural pain: Secondary | ICD-10-CM | POA: Diagnosis not present

## 2017-05-06 DIAGNOSIS — I2510 Atherosclerotic heart disease of native coronary artery without angina pectoris: Secondary | ICD-10-CM | POA: Diagnosis present

## 2017-05-06 DIAGNOSIS — I70262 Atherosclerosis of native arteries of extremities with gangrene, left leg: Secondary | ICD-10-CM | POA: Diagnosis not present

## 2017-05-06 DIAGNOSIS — Z6841 Body Mass Index (BMI) 40.0 and over, adult: Secondary | ICD-10-CM | POA: Diagnosis not present

## 2017-05-06 DIAGNOSIS — T8131XA Disruption of external operation (surgical) wound, not elsewhere classified, initial encounter: Principal | ICD-10-CM | POA: Diagnosis present

## 2017-05-06 DIAGNOSIS — E785 Hyperlipidemia, unspecified: Secondary | ICD-10-CM | POA: Diagnosis not present

## 2017-05-06 DIAGNOSIS — I252 Old myocardial infarction: Secondary | ICD-10-CM

## 2017-05-06 DIAGNOSIS — R278 Other lack of coordination: Secondary | ICD-10-CM | POA: Diagnosis not present

## 2017-05-06 DIAGNOSIS — R001 Bradycardia, unspecified: Secondary | ICD-10-CM | POA: Diagnosis not present

## 2017-05-06 DIAGNOSIS — Z9049 Acquired absence of other specified parts of digestive tract: Secondary | ICD-10-CM

## 2017-05-06 DIAGNOSIS — S88019A Complete traumatic amputation at knee level, unspecified lower leg, initial encounter: Secondary | ICD-10-CM | POA: Diagnosis not present

## 2017-05-06 DIAGNOSIS — F32A Depression, unspecified: Secondary | ICD-10-CM | POA: Diagnosis present

## 2017-05-06 DIAGNOSIS — Z85828 Personal history of other malignant neoplasm of skin: Secondary | ICD-10-CM

## 2017-05-06 DIAGNOSIS — L89629 Pressure ulcer of left heel, unspecified stage: Secondary | ICD-10-CM | POA: Diagnosis not present

## 2017-05-06 DIAGNOSIS — Z951 Presence of aortocoronary bypass graft: Secondary | ICD-10-CM

## 2017-05-06 DIAGNOSIS — M14672 Charcot's joint, left ankle and foot: Secondary | ICD-10-CM | POA: Diagnosis present

## 2017-05-06 DIAGNOSIS — L97429 Non-pressure chronic ulcer of left heel and midfoot with unspecified severity: Secondary | ICD-10-CM | POA: Diagnosis not present

## 2017-05-06 DIAGNOSIS — I96 Gangrene, not elsewhere classified: Secondary | ICD-10-CM | POA: Diagnosis present

## 2017-05-06 DIAGNOSIS — Z89512 Acquired absence of left leg below knee: Secondary | ICD-10-CM | POA: Diagnosis not present

## 2017-05-06 DIAGNOSIS — Y838 Other surgical procedures as the cause of abnormal reaction of the patient, or of later complication, without mention of misadventure at the time of the procedure: Secondary | ICD-10-CM | POA: Diagnosis present

## 2017-05-06 DIAGNOSIS — I10 Essential (primary) hypertension: Secondary | ICD-10-CM | POA: Diagnosis not present

## 2017-05-06 DIAGNOSIS — G629 Polyneuropathy, unspecified: Secondary | ICD-10-CM | POA: Diagnosis present

## 2017-05-06 DIAGNOSIS — F329 Major depressive disorder, single episode, unspecified: Secondary | ICD-10-CM | POA: Diagnosis present

## 2017-05-06 DIAGNOSIS — M6281 Muscle weakness (generalized): Secondary | ICD-10-CM | POA: Diagnosis not present

## 2017-05-06 DIAGNOSIS — T8131XS Disruption of external operation (surgical) wound, not elsewhere classified, sequela: Secondary | ICD-10-CM | POA: Diagnosis not present

## 2017-05-06 DIAGNOSIS — G8911 Acute pain due to trauma: Secondary | ICD-10-CM | POA: Diagnosis not present

## 2017-05-06 HISTORY — PX: AMPUTATION: SHX166

## 2017-05-06 LAB — BASIC METABOLIC PANEL
ANION GAP: 14 (ref 5–15)
BUN: 26 mg/dL — ABNORMAL HIGH (ref 6–20)
CALCIUM: 9.7 mg/dL (ref 8.9–10.3)
CHLORIDE: 101 mmol/L (ref 101–111)
CO2: 21 mmol/L — AB (ref 22–32)
Creatinine, Ser: 0.92 mg/dL (ref 0.44–1.00)
GFR calc non Af Amer: >60 mL/min (ref >60)
Glucose, Bld: 94 mg/dL (ref 65–99)
Potassium: 3.7 mmol/L (ref 3.5–5.1)
Sodium: 136 mmol/L (ref 135–145)

## 2017-05-06 LAB — CBC
HEMATOCRIT: 41.6 % (ref 36.0–46.0)
HEMOGLOBIN: 13.3 g/dL (ref 12.0–15.0)
MCH: 27.5 pg (ref 26.0–34.0)
MCHC: 32 g/dL (ref 30.0–36.0)
MCV: 86 fL (ref 78.0–100.0)
Platelets: 366 10*3/uL (ref 150–400)
RBC: 4.84 MIL/uL (ref 3.87–5.11)
RDW: 16.1 % — AB (ref 11.5–15.5)
WBC: 12.4 10*3/uL — AB (ref 4.0–10.5)

## 2017-05-06 SURGERY — AMPUTATION BELOW KNEE
Anesthesia: General | Laterality: Left

## 2017-05-06 MED ORDER — MIDAZOLAM HCL 2 MG/2ML IJ SOLN
INTRAMUSCULAR | Status: AC
  Administered 2017-05-06: 2 mg via INTRAVENOUS
  Filled 2017-05-06: qty 2

## 2017-05-06 MED ORDER — METOCLOPRAMIDE HCL 5 MG/ML IJ SOLN: 5.0000 mg | Freq: Three times a day (TID) | INTRAMUSCULAR | Status: DC | PRN

## 2017-05-06 MED ORDER — NITROGLYCERIN 0.4 MG SL SUBL: 0.4000 mg | SUBLINGUAL_TABLET | SUBLINGUAL | Status: DC | PRN

## 2017-05-06 MED ORDER — HYDROMORPHONE HCL 1 MG/ML IJ SOLN: 0.2500 mg | INTRAMUSCULAR | Status: DC | PRN

## 2017-05-06 MED ORDER — HYDROMORPHONE HCL 1 MG/ML IJ SOLN
0.5000 mg | INTRAMUSCULAR | Status: AC | PRN
  Administered 2017-05-06 (×4): 0.5 mg via INTRAVENOUS

## 2017-05-06 MED ORDER — HYDROMORPHONE HCL 1 MG/ML IJ SOLN
1.0000 mg | INTRAMUSCULAR | Status: DC | PRN
  Administered 2017-05-06 (×3): 1 mg via INTRAVENOUS
  Filled 2017-05-06 (×3): qty 1

## 2017-05-06 MED ORDER — LIDOCAINE 2% (20 MG/ML) 5 ML SYRINGE
INTRAMUSCULAR | Status: AC
  Filled 2017-05-06: qty 5

## 2017-05-06 MED ORDER — ONDANSETRON HCL 4 MG/2ML IJ SOLN
INTRAMUSCULAR | Status: DC | PRN
  Administered 2017-05-06: 4 mg via INTRAVENOUS

## 2017-05-06 MED ORDER — DEXMEDETOMIDINE HCL IN NACL 200 MCG/50ML IV SOLN
INTRAVENOUS | Status: DC | PRN
  Administered 2017-05-06 (×3): 8 ug via INTRAVENOUS

## 2017-05-06 MED ORDER — OXYCODONE HCL 5 MG PO TABS
5.0000 mg | ORAL_TABLET | ORAL | Status: DC | PRN
  Administered 2017-05-07 – 2017-05-10 (×10): 5 mg via ORAL
  Filled 2017-05-06 (×9): qty 1

## 2017-05-06 MED ORDER — FENTANYL CITRATE (PF) 100 MCG/2ML IJ SOLN
50.0000 ug | INTRAMUSCULAR | Status: DC | PRN
  Administered 2017-05-06: 100 ug via INTRAVENOUS

## 2017-05-06 MED ORDER — SERTRALINE HCL 50 MG PO TABS
50.0000 mg | ORAL_TABLET | Freq: Every day | ORAL | Status: DC
  Administered 2017-05-06 – 2017-05-11 (×6): 50 mg via ORAL
  Filled 2017-05-06 (×6): qty 1

## 2017-05-06 MED ORDER — IRBESARTAN 150 MG PO TABS
150.0000 mg | ORAL_TABLET | Freq: Every day | ORAL | Status: DC
  Administered 2017-05-06 – 2017-05-11 (×6): 150 mg via ORAL
  Filled 2017-05-06 (×6): qty 1

## 2017-05-06 MED ORDER — KETOROLAC TROMETHAMINE 30 MG/ML IJ SOLN
INTRAMUSCULAR | Status: AC
  Administered 2017-05-06: 30 mg via INTRAVENOUS
  Filled 2017-05-06: qty 1

## 2017-05-06 MED ORDER — METOPROLOL TARTRATE 12.5 MG HALF TABLET
12.5000 mg | ORAL_TABLET | Freq: Two times a day (BID) | ORAL | Status: DC
  Administered 2017-05-06: 12.5 mg via ORAL
  Filled 2017-05-06: qty 1

## 2017-05-06 MED ORDER — VALSARTAN-HYDROCHLOROTHIAZIDE 160-12.5 MG PO TABS: 1.0000 | ORAL_TABLET | Freq: Every day | ORAL | Status: DC

## 2017-05-06 MED ORDER — FENTANYL CITRATE (PF) 250 MCG/5ML IJ SOLN
INTRAMUSCULAR | Status: AC
  Filled 2017-05-06: qty 5

## 2017-05-06 MED ORDER — METHOCARBAMOL 500 MG PO TABS
500.0000 mg | ORAL_TABLET | Freq: Four times a day (QID) | ORAL | Status: DC | PRN
  Administered 2017-05-06 – 2017-05-11 (×15): 500 mg via ORAL
  Filled 2017-05-06 (×15): qty 1

## 2017-05-06 MED ORDER — CEFAZOLIN SODIUM-DEXTROSE 2-4 GM/100ML-% IV SOLN
2.0000 g | Freq: Four times a day (QID) | INTRAVENOUS | Status: AC
  Administered 2017-05-06 – 2017-05-07 (×3): 2 g via INTRAVENOUS
  Filled 2017-05-06 (×3): qty 100

## 2017-05-06 MED ORDER — ACETAMINOPHEN 325 MG PO TABS
650.0000 mg | ORAL_TABLET | ORAL | Status: DC | PRN
Start: 2017-05-06 — End: 2017-05-11
  Filled 2017-05-06: qty 2

## 2017-05-06 MED ORDER — MIDAZOLAM HCL 2 MG/2ML IJ SOLN
INTRAMUSCULAR | Status: AC
  Administered 2017-05-06: 1 mg via INTRAVENOUS
  Filled 2017-05-06: qty 2

## 2017-05-06 MED ORDER — ONDANSETRON HCL 4 MG PO TABS: 4.0000 mg | ORAL_TABLET | Freq: Four times a day (QID) | ORAL | Status: DC | PRN

## 2017-05-06 MED ORDER — MEPERIDINE HCL 50 MG/ML IJ SOLN: 6.2500 mg | INTRAMUSCULAR | Status: DC | PRN

## 2017-05-06 MED ORDER — MIDAZOLAM HCL 2 MG/2ML IJ SOLN
1.0000 mg | Freq: Once | INTRAMUSCULAR | Status: AC
  Administered 2017-05-06: 1 mg via INTRAVENOUS

## 2017-05-06 MED ORDER — HYDROMORPHONE HCL 1 MG/ML IJ SOLN
INTRAMUSCULAR | Status: AC
  Administered 2017-05-06: 0.5 mg via INTRAVENOUS
  Filled 2017-05-06: qty 1

## 2017-05-06 MED ORDER — ACETAMINOPHEN 10 MG/ML IV SOLN: 1000.0000 mg | Freq: Once | INTRAVENOUS | Status: DC

## 2017-05-06 MED ORDER — POLYETHYLENE GLYCOL 3350 17 G PO PACK: 17.0000 g | PACK | Freq: Every day | ORAL | Status: DC | PRN

## 2017-05-06 MED ORDER — LIDOCAINE 2% (20 MG/ML) 5 ML SYRINGE
INTRAMUSCULAR | Status: DC | PRN
  Administered 2017-05-06: 40 mg via INTRAVENOUS
  Administered 2017-05-06: 60 mg via INTRAVENOUS

## 2017-05-06 MED ORDER — FUROSEMIDE 20 MG PO TABS
20.0000 mg | ORAL_TABLET | Freq: Every day | ORAL | Status: DC
  Administered 2017-05-07 – 2017-05-11 (×5): 20 mg via ORAL
  Filled 2017-05-06 (×5): qty 1

## 2017-05-06 MED ORDER — BISACODYL 10 MG RE SUPP: 10.0000 mg | Freq: Every day | RECTAL | Status: DC | PRN

## 2017-05-06 MED ORDER — FENTANYL CITRATE (PF) 100 MCG/2ML IJ SOLN
INTRAMUSCULAR | Status: AC
  Administered 2017-05-06: 50 ug via INTRAVENOUS
  Filled 2017-05-06: qty 2

## 2017-05-06 MED ORDER — ROPIVACAINE HCL 7.5 MG/ML IJ SOLN
INTRAMUSCULAR | Status: DC | PRN
  Administered 2017-05-06 (×6): 5 mL via PERINEURAL

## 2017-05-06 MED ORDER — MAGNESIUM CITRATE PO SOLN: 1.0000 | Freq: Once | ORAL | Status: DC | PRN

## 2017-05-06 MED ORDER — TRAZODONE HCL 50 MG PO TABS: 150.0000 mg | ORAL_TABLET | Freq: Every evening | ORAL | Status: DC | PRN

## 2017-05-06 MED ORDER — KETOROLAC TROMETHAMINE 30 MG/ML IJ SOLN
30.0000 mg | Freq: Once | INTRAMUSCULAR | Status: DC | PRN
  Administered 2017-05-06: 30 mg via INTRAVENOUS

## 2017-05-06 MED ORDER — LACTATED RINGERS IV SOLN
INTRAVENOUS | Status: DC
  Administered 2017-05-06 (×2): via INTRAVENOUS

## 2017-05-06 MED ORDER — OXYCODONE HCL 5 MG PO TABS
10.0000 mg | ORAL_TABLET | ORAL | Status: DC | PRN
  Administered 2017-05-06 – 2017-05-11 (×15): 10 mg via ORAL
  Filled 2017-05-06 (×17): qty 2

## 2017-05-06 MED ORDER — PROMETHAZINE HCL 25 MG/ML IJ SOLN
INTRAMUSCULAR | Status: AC
  Administered 2017-05-06: 6.25 mg via INTRAVENOUS
  Filled 2017-05-06: qty 1

## 2017-05-06 MED ORDER — ACETAMINOPHEN 650 MG RE SUPP: 650.0000 mg | RECTAL | Status: DC | PRN

## 2017-05-06 MED ORDER — FENTANYL CITRATE (PF) 250 MCG/5ML IJ SOLN
INTRAMUSCULAR | Status: DC | PRN
  Administered 2017-05-06 (×6): 50 ug via INTRAVENOUS

## 2017-05-06 MED ORDER — FENTANYL CITRATE (PF) 100 MCG/2ML IJ SOLN
50.0000 ug | INTRAMUSCULAR | Status: DC | PRN
  Administered 2017-05-06: 50 ug via INTRAVENOUS

## 2017-05-06 MED ORDER — DEXMEDETOMIDINE HCL IN NACL 200 MCG/50ML IV SOLN
INTRAVENOUS | Status: AC
  Filled 2017-05-06: qty 50

## 2017-05-06 MED ORDER — FENOFIBRATE 160 MG PO TABS
160.0000 mg | ORAL_TABLET | Freq: Every day | ORAL | Status: DC
  Administered 2017-05-06 – 2017-05-11 (×6): 160 mg via ORAL
  Filled 2017-05-06 (×6): qty 1

## 2017-05-06 MED ORDER — PROPOFOL 10 MG/ML IV BOLUS
INTRAVENOUS | Status: AC
  Filled 2017-05-06: qty 20

## 2017-05-06 MED ORDER — MIDAZOLAM HCL 2 MG/2ML IJ SOLN
1.0000 mg | INTRAMUSCULAR | Status: DC | PRN
  Administered 2017-05-06: 2 mg via INTRAVENOUS

## 2017-05-06 MED ORDER — GABAPENTIN 300 MG PO CAPS
300.0000 mg | ORAL_CAPSULE | Freq: Three times a day (TID) | ORAL | Status: DC
  Administered 2017-05-06 – 2017-05-11 (×15): 300 mg via ORAL
  Filled 2017-05-06 (×15): qty 1

## 2017-05-06 MED ORDER — 0.9 % SODIUM CHLORIDE (POUR BTL) OPTIME
TOPICAL | Status: DC | PRN
  Administered 2017-05-06: 1000 mL

## 2017-05-06 MED ORDER — METHOCARBAMOL 1000 MG/10ML IJ SOLN
500.0000 mg | Freq: Four times a day (QID) | INTRAVENOUS | Status: DC | PRN
  Filled 2017-05-06: qty 5

## 2017-05-06 MED ORDER — ONDANSETRON HCL 4 MG/2ML IJ SOLN: 4.0000 mg | Freq: Four times a day (QID) | INTRAMUSCULAR | Status: DC | PRN

## 2017-05-06 MED ORDER — BUPROPION HCL 100 MG PO TABS
50.0000 mg | ORAL_TABLET | Freq: Every day | ORAL | Status: DC
  Administered 2017-05-07 – 2017-05-11 (×5): 50 mg via ORAL
  Filled 2017-05-06 (×5): qty 0.5

## 2017-05-06 MED ORDER — DEXAMETHASONE SODIUM PHOSPHATE 10 MG/ML IJ SOLN
INTRAMUSCULAR | Status: DC | PRN
  Administered 2017-05-06: 4 mg via INTRAVENOUS

## 2017-05-06 MED ORDER — DOCUSATE SODIUM 100 MG PO CAPS
100.0000 mg | ORAL_CAPSULE | Freq: Two times a day (BID) | ORAL | Status: DC
  Administered 2017-05-06 – 2017-05-11 (×10): 100 mg via ORAL
  Filled 2017-05-06 (×10): qty 1

## 2017-05-06 MED ORDER — HYDROCHLOROTHIAZIDE 12.5 MG PO CAPS
12.5000 mg | ORAL_CAPSULE | Freq: Every day | ORAL | Status: DC
  Administered 2017-05-06 – 2017-05-11 (×6): 12.5 mg via ORAL
  Filled 2017-05-06 (×6): qty 1

## 2017-05-06 MED ORDER — ASPIRIN EC 81 MG PO TBEC
81.0000 mg | DELAYED_RELEASE_TABLET | Freq: Every day | ORAL | Status: DC
  Administered 2017-05-07 – 2017-05-11 (×5): 81 mg via ORAL
  Filled 2017-05-06 (×5): qty 1

## 2017-05-06 MED ORDER — METOCLOPRAMIDE HCL 5 MG PO TABS: 5.0000 mg | ORAL_TABLET | Freq: Three times a day (TID) | ORAL | Status: DC | PRN

## 2017-05-06 MED ORDER — PROPOFOL 10 MG/ML IV BOLUS
INTRAVENOUS | Status: DC | PRN
  Administered 2017-05-06: 120 mg via INTRAVENOUS
  Administered 2017-05-06: 40 mg via INTRAVENOUS

## 2017-05-06 MED ORDER — GABAPENTIN 400 MG PO CAPS: 400.0000 mg | ORAL_CAPSULE | Freq: Once | ORAL | Status: DC

## 2017-05-06 MED ORDER — PROMETHAZINE HCL 25 MG/ML IJ SOLN
6.2500 mg | INTRAMUSCULAR | Status: DC | PRN
  Administered 2017-05-06: 6.25 mg via INTRAVENOUS

## 2017-05-06 MED ORDER — SODIUM CHLORIDE 0.9 % IV SOLN
INTRAVENOUS | Status: DC
  Administered 2017-05-06: 14:00:00 via INTRAVENOUS

## 2017-05-06 MED ORDER — FENTANYL CITRATE (PF) 100 MCG/2ML IJ SOLN
INTRAMUSCULAR | Status: AC
  Administered 2017-05-06: 100 ug via INTRAVENOUS
  Filled 2017-05-06: qty 2

## 2017-05-06 MED ORDER — DEXTROSE 5 % IV SOLN
INTRAVENOUS | Status: DC | PRN
  Administered 2017-05-06: 3 g via INTRAVENOUS

## 2017-05-06 SURGICAL SUPPLY — 34 items
BLADE SAW RECIP 87.9 MT (BLADE) ×2 IMPLANT
BLADE SURG 21 STRL SS (BLADE) ×2 IMPLANT
BNDG COHESIVE 6X5 TAN STRL LF (GAUZE/BANDAGES/DRESSINGS) ×4 IMPLANT
BNDG GAUZE ELAST 4 BULKY (GAUZE/BANDAGES/DRESSINGS) ×4 IMPLANT
CANISTER SUCTION 1500CC (MISCELLANEOUS) ×2 IMPLANT
COVER SURGICAL LIGHT HANDLE (MISCELLANEOUS) ×2 IMPLANT
CUFF TOURNIQUET SINGLE 34IN LL (TOURNIQUET CUFF) IMPLANT
CUFF TOURNIQUET SINGLE 44IN (TOURNIQUET CUFF) IMPLANT
DRAPE INCISE IOBAN 66X45 STRL (DRAPES) IMPLANT
DRAPE U-SHAPE 47X51 STRL (DRAPES) ×2 IMPLANT
DRESSING PREVENA PLUS CUSTOM (GAUZE/BANDAGES/DRESSINGS) ×1 IMPLANT
DRSG PREVENA PLUS CUSTOM (GAUZE/BANDAGES/DRESSINGS) ×2
DRSG VAC ATS MED SENSATRAC (GAUZE/BANDAGES/DRESSINGS) ×2 IMPLANT
ELECT REM PT RETURN 9FT ADLT (ELECTROSURGICAL) ×2
ELECTRODE REM PT RTRN 9FT ADLT (ELECTROSURGICAL) ×1 IMPLANT
GLOVE BIOGEL PI IND STRL 9 (GLOVE) ×1 IMPLANT
GLOVE BIOGEL PI INDICATOR 9 (GLOVE) ×1
GLOVE SURG ORTHO 9.0 STRL STRW (GLOVE) ×2 IMPLANT
GOWN STRL REUS W/ TWL XL LVL3 (GOWN DISPOSABLE) ×2 IMPLANT
GOWN STRL REUS W/TWL XL LVL3 (GOWN DISPOSABLE) ×2
KIT BASIN OR (CUSTOM PROCEDURE TRAY) ×2 IMPLANT
KIT ROOM TURNOVER OR (KITS) ×2 IMPLANT
MANIFOLD NEPTUNE II (INSTRUMENTS) ×2 IMPLANT
NS IRRIG 1000ML POUR BTL (IV SOLUTION) ×2 IMPLANT
PACK ORTHO EXTREMITY (CUSTOM PROCEDURE TRAY) ×2 IMPLANT
PAD ARMBOARD 7.5X6 YLW CONV (MISCELLANEOUS) ×2 IMPLANT
SPONGE LAP 18X18 X RAY DECT (DISPOSABLE) IMPLANT
STAPLER VISISTAT 35W (STAPLE) IMPLANT
STOCKINETTE IMPERVIOUS LG (DRAPES) ×2 IMPLANT
SUT SILK 2 0 (SUTURE) ×1
SUT SILK 2-0 18XBRD TIE 12 (SUTURE) ×1 IMPLANT
SUT VIC AB 1 CTX 27 (SUTURE) IMPLANT
TOWEL OR 17X26 10 PK STRL BLUE (TOWEL DISPOSABLE) ×2 IMPLANT
YANKAUER SUCT BULB TIP NO VENT (SUCTIONS) ×2 IMPLANT

## 2017-05-06 NOTE — Anesthesia Preprocedure Evaluation (Signed)
Anesthesia Evaluation  Patient identified by MRN, date of birth, ID band Patient awake    Reviewed: Allergy & Precautions, NPO status , Patient's Chart, lab work & pertinent test results, reviewed documented beta blocker date and time   History of Anesthesia Complications (+) PROLONGED EMERGENCE and history of anesthetic complications ("low O2 sat after surgery ")  Airway Mallampati: III  TM Distance: >3 FB Neck ROM: Full    Dental  (+) Edentulous Upper, Edentulous Lower   Pulmonary neg COPD, former smoker,    breath sounds clear to auscultation       Cardiovascular hypertension, Pt. on medications and Pt. on home beta blockers + CAD, + Past MI, + CABG and + DOE   Rhythm:Regular Rate:Bradycardia  EKG - SB with LVH   Neuro/Psych PSYCHIATRIC DISORDERS Anxiety negative neurological ROS     GI/Hepatic Neg liver ROS, neg GERD  ,  Endo/Other  Morbid obesity  Renal/GU Renal InsufficiencyRenal disease     Musculoskeletal  (+) Arthritis ,   Abdominal (+) + obese,   Peds  Hematology   Anesthesia Other Findings Basal cell ca  Reproductive/Obstetrics                             Anesthesia Physical  Anesthesia Plan  ASA: III  Anesthesia Plan: General   Post-op Pain Management:  Regional for Post-op pain   Induction: Intravenous  PONV Risk Score and Plan: 4 or greater and Ondansetron, Dexamethasone and Midazolam  Airway Management Planned: LMA  Additional Equipment:   Intra-op Plan:   Post-operative Plan: Extubation in OR  Informed Consent: I have reviewed the patients History and Physical, chart, labs and discussed the procedure including the risks, benefits and alternatives for the proposed anesthesia with the patient or authorized representative who has indicated his/her understanding and acceptance.     Plan Discussed with: CRNA and Surgeon  Anesthesia Plan Comments:          Anesthesia Quick Evaluation

## 2017-05-06 NOTE — Anesthesia Postprocedure Evaluation (Signed)
Anesthesia Post Note  Patient: Julia West  Procedure(s) Performed: BELOW KNEE AMPUTATION (Left )     Patient location during evaluation: PACU Anesthesia Type: General Level of consciousness: awake and sedated Pain management: pain level not controlled Vital Signs Assessment: post-procedure vital signs reviewed and stable Respiratory status: spontaneous breathing Cardiovascular status: stable Anesthetic complications: yes Anesthetic complication details: PONV   Last Vitals:  Vitals:   05/06/17 1131 05/06/17 1146  BP: (!) 153/75 139/66  Pulse: 100 91  Resp: 15 12  Temp:    SpO2: 99% 97%    Last Pain:  Vitals:   05/06/17 1132  TempSrc:   PainSc: 10-Worst pain ever   Pain Goal: Patients Stated Pain Goal: 5 (05/06/17 0834)               Trooper

## 2017-05-06 NOTE — Transfer of Care (Signed)
Immediate Anesthesia Transfer of Care Note  Patient: Julia West  Procedure(s) Performed: BELOW KNEE AMPUTATION (Left )  Patient Location: PACU  Anesthesia Type:GA combined with regional for post-op pain  Level of Consciousness: sedated, drowsy and responds to stimulation  Airway & Oxygen Therapy: Patient Spontanous Breathing and Patient connected to nasal cannula oxygen  Post-op Assessment: Report given to RN and Post -op Vital signs reviewed and stable  Post vital signs: Reviewed and stable  Last Vitals:  Vitals:   05/06/17 0945 05/06/17 0950  BP: (!) 117/43 (!) 131/54  Pulse: (!) 55 (!) 51  Resp: 18 16  Temp:    SpO2: 92% 95%    Last Pain:  Vitals:   05/06/17 0834  TempSrc:   PainSc: 6       Patients Stated Pain Goal: 5 (86/57/84 6962)  Complications: No apparent anesthesia complications

## 2017-05-06 NOTE — Anesthesia Procedure Notes (Addendum)
Anesthesia Regional Block: Adductor canal block   Pre-Anesthetic Checklist: ,, timeout performed, Correct Patient, Correct Site, Correct Laterality, Correct Procedure, Correct Position, site marked, Risks and benefits discussed,  Surgical consent,  Pre-op evaluation,  At surgeon's request and post-op pain management  Laterality: Left and Lower  Prep: chloraprep       Needles:  Injection technique: Single-shot     Needle Length: 9cm  Needle Gauge: 21   Needle insertion depth: 4 cm   Additional Needles:   Procedures:,,,, ultrasound used (permanent image in chart),,,,  Narrative:  Start time: 05/06/2017 9:25 AM End time: 05/06/2017 9:43 AM Injection made incrementally with aspirations every 5 mL.  Performed by: Personally  Anesthesiologist: Lyn Hollingshead, MD

## 2017-05-06 NOTE — Anesthesia Procedure Notes (Signed)
Procedure Name: LMA Insertion Date/Time: 05/06/2017 10:11 AM Performed by: Renato Shin, CRNA Pre-anesthesia Checklist: Patient identified, Emergency Drugs available, Suction available and Patient being monitored Patient Re-evaluated:Patient Re-evaluated prior to induction Oxygen Delivery Method: Circle system utilized Preoxygenation: Pre-oxygenation with 100% oxygen Induction Type: IV induction LMA: LMA inserted LMA Size: 4.0 Number of attempts: 1 Placement Confirmation: positive ETCO2,  CO2 detector and breath sounds checked- equal and bilateral Tube secured with: Tape Dental Injury: Teeth and Oropharynx as per pre-operative assessment

## 2017-05-06 NOTE — H&P (Signed)
Julia West is an 62 y.o. female.   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  Patient has had progressive dehiscence of the surgical incisions and has had progressive black gangrenous changes to the heel pad.  Assessment:  Past Medical History:  Diagnosis Date  . Acute myocardial infarction    with a ruptured plaque in the circumflex in 2003  . Anxiety   . Cancer ([place])    basal cell face  . Complication of anesthesia    states low O2 sats post-op 11/13, always slow to awaken  . Coronary artery disease   . Degenerative joint disease   . Dyspnea    with activity- car to house - steps  . Heart murmur    "not to be concerned"  . HTN (hypertension)   . Hyperlipidemia LDL goal <70   . Internal and external hemorrhoids without complication   . Lymphedema    right side of body  . Lymphedema of arm    right  . Morbid obesity ([place])   . Surgical wound, non healing ABDOMINAL    has wound vac @ 125 mm Hg    Past Surgical History:  Procedure Laterality Date  . APPLICATION OF WOUND VAC    . BOWEL RESECTION  02/07/2012   Procedure: SMALL BOWEL RESECTION;  Surgeon: Julia Curry, MD,FACS;  Location: [place];  Service: General;;  . CARDIOVASCULAR STRESS TEST  01-17-2012  DR HOCHREIN   LOW RISK NUCLEAR TEST  . CESAREAN SECTION     x 4 in remote past  . CHOLECYSTECTOMY    . CORONARY ARTERY BYPASS GRAFT  2003   by Dr. Servando Snare. LIMA to the LAD, free RIMA to the circumflex. Stress perfusion study December 2009 with no high-risk areas of ischemia. She has a well-preserved ejection fraction  . HYSTEROSCOPY W/D&C N/A 05/14/2014   Procedure: DILATATION AND CURETTAGE (no specimen); HYSTEROSCOPY;  Surgeon: Julia Kind, MD;  Location: [facility];  Service: Gynecology;  Laterality: N/A;  . INCISION AND DRAINAGE OF WOUND  04/17/2012   Procedure: IRRIGATION AND  DEBRIDEMENT WOUND;  Surgeon: Julia Kos, DO;  Location: [place];  Service: Plastics;  Laterality: N/A;  OF ABDOMINAL WOUND, SURGICAL PREP AND PLACEMENT OF VAC, REMOVAL FOREHEAD SKIN LESION  . INCISION AND DRAINAGE OF WOUND N/A 04/24/2012   Procedure: IRRIGATION AND DEBRIDEMENT WOUND;  Surgeon: Julia Kos, DO;  Location: [place];  Service: Plastics;  Laterality: N/A;  I & D ABDOMINAL WOUND WITH VAC AND ACELL  . INCISION AND DRAINAGE OF WOUND N/A 05/01/2012   Procedure: IRRIGATION AND DEBRIDEMENT WOUND;  Surgeon: Julia Kos, DO;  Location: [place];  Service: Plastics;  Laterality: N/A;  WITH SURGICAL PREP AND PLACEMENT OF VAC  . INCISION AND DRAINAGE OF WOUND N/A 05/08/2012   Procedure: IRRIGATION AND DEBRIDEMENT OF ABD WOUND SURGICAL PREP AND PLACEMENT OF VAC ;  Surgeon: Julia Kos, DO;  Location: [place];  Service: Plastics;  Laterality: N/A;  IRRIGATION AND DEBRIDEMENT OF ABD WOUND SURGICAL PREP AND PLACEMENT OF VAC   . INCISION AND DRAINAGE OF WOUND N/A 05/15/2012   Procedure: IRRIGATION AND DEBRIDEMENT OF ABDOMINAL WOUND WITH POSSIBLE SURGICAL PREP AND PLACEMENT OF VAC;  Surgeon: Julia Kos, DO;  Location: [facility];  Service: Plastics;  Laterality: N/A;  . INCISION AND DRAINAGE OF  WOUND N/A 05/22/2012   Procedure: IRRIGATION AND DEBRIDEMENT OF ABODOMINAL WOUND WITH  SURGICAL PREP AND VAC PLACEMENT;  Surgeon: Julia Kos, DO;  Location: [place];  Service: Plastics;  Laterality: N/A;  . INCISION AND DRAINAGE OF WOUND N/A 06/28/2012   Procedure: IRRIGATION AND DEBRIDEMENT OF ABDOMINAL ULCER SURGICAL PREP AND PLACEMENT OF ACELL AND VAC;  Surgeon: Julia Kos, DO;  Location: [place];  Service: Plastics;  Laterality: N/A;  . INCISIONAL HERNIA REPAIR  02/07/2012   Procedure: HERNIA REPAIR INCISIONAL;  Surgeon: Julia Curry, MD,FACS;  Location: [place];  Service:  General;;  Open, Primary repair, strangulated Incisional hernia.  Marland Kitchen LESION REMOVAL  04/17/2012   Procedure: LESION REMOVAL;  Surgeon: Julia Kos, DO;  Location: [place];  Service: Plastics;  Laterality: N/A;   CENTER OF FOREHEAD, REMOVAL FORHEAD SKIN LESION  . ORIF TOE FRACTURE Left 02/09/2017   Procedure: EXCISION TALAR HEAD, INTERNAL FIXATION MEDIAL COLUMN LEFT FOOT;  Surgeon: Julia Minion, MD;  Location: [place];  Service: Orthopedics;  Laterality: Left;  . POLYPECTOMY N/A 05/14/2014   Procedure: ENDOMETRIAL POLYPECTOMY;  Surgeon: Julia Kind, MD;  Location: [facility];  Service: Gynecology;  Laterality: N/A;    Family History  Problem Relation Age of Onset  . Heart attack Mother   . Hypertension Sister   . Diabetes Brother   . Birth defects Son        heart defect   Social History:  reports that she quit smoking about 5 years ago. Her smoking use included cigarettes. She has a 25.00 pack-year smoking history. she has never used smokeless tobacco. She reports that she does not drink alcohol or use drugs.  Allergies:  Allergies  Allergen Reactions  . Ace Inhibitors Cough    No medications prior to admission.    No results found for this or any previous visit (from the past 48 hour(s)). No results found.  ROS  There were no vitals taken for this visit. Physical Exam  On examination patient has 2 small wounds medially which communicates to the hardware into a deep soft tissue void adjacent to the hardware.  There is no cellulitis her foot is plantigrade.  She has a progressive enlarging black decubitus heel ulcer with dry gangrene. Assessment/Plan Right foot wound dehiscence status post reconstruction for Charcot collapse with black eschar right heel with worsening dry gangrenous changes to the right heel.  Plan: Discussed with patient recommendation to proceed with a transtibial amputation versus continued foot salvage discussed the prolonged nature and  uncertain outcome of prolonged foot salvage with the microcirculatory disorder involving her foot with progressive gangrenous changes to the heel pad without weightbearing.  Patient and family state they understand a will discuss prior to surgery whether they want to pursue foot salvage intervention versus transtibial amputation.  Dr. Marla Roe to consult preoperatively with the family.  Julia Minion, MD 05/06/2017, 7:13 AM

## 2017-05-06 NOTE — Plan of Care (Signed)
  Nutrition: Adequate nutrition will be maintained 05/06/2017 1247 - Progressing by Williams Che, RN   Elimination: Will not experience complications related to bowel motility 05/06/2017 1247 - Progressing by Williams Che, RN   Pain Managment: General experience of comfort will improve 05/06/2017 1247 - Progressing by Williams Che, RN   Safety: Ability to remain free from injury will improve 05/06/2017 1247 - Progressing by Williams Che, RN   Skin Integrity: Risk for impaired skin integrity will decrease 05/06/2017 1247 - Progressing by Williams Che, RN

## 2017-05-06 NOTE — Op Note (Signed)
   Date of Surgery: 05/06/2017  INDICATIONS: Ms. 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  Patient has developed progressive ischemic changes to the incision with wound breakdown and a black necrotic eschar to the heel.  Due to patient's failure of foot salvage intervention she presents at this time for transtibial amputation.Marland Kitchen  PREOPERATIVE DIAGNOSIS: Charcot collapse with wound breakdown and decubitus left heel ulcer  POSTOPERATIVE DIAGNOSIS: Same.  PROCEDURE: Transtibial amputation Application of Prevena wound VAC  SURGEON: Sharol Given, M.D.  ANESTHESIA:  general  IV FLUIDS AND URINE: See anesthesia.  ESTIMATED BLOOD LOSS: 100 mL.  COMPLICATIONS: None.  DESCRIPTION OF PROCEDURE: The patient was brought to the operating room and underwent a general anesthetic. After adequate levels of anesthesia were obtained patient's lower extremity was prepped using DuraPrep draped into a sterile field. A timeout was called. The foot was draped out of the sterile field with impervious stockinette. A transverse incision was made 11 cm distal to the tibial tubercle. This curved proximally and a large posterior flap was created. The tibia was transected 1 cm proximal to the skin incision. The fibula was transected just proximal to the tibial incision. The tibia was beveled anteriorly. A large posterior flap was created. The sciatic nerve was pulled cut and allowed to retract. The vascular bundles were suture ligated with 2-0 silk. The deep and superficial fascial layers were closed using #1 Vicryl. The skin was closed using staples and 2-0 nylon. The wound was covered with a Prevena wound VAC. There was a good suction fit. A prosthetic shrinker was applied. Patient was extubated taken to the PACU in stable condition.   DISCHARGE PLANNING:  Antibiotic duration: 24  hours  Weightbearing: Nonweightbearing on the left  Pain medication: As needed  Dressing care/ Wound VAC: Incisional wound VAC  Discharge to: Skilled nursing facility.  Follow-up: In the office 1 week post operative.  Meridee Score, MD [place] 10:42 AM

## 2017-05-06 NOTE — Clinical Social Work Note (Addendum)
Clinical Social Work Assessment  Patient Details  Name: Julia West MRN: [id number] Date of Birth: 1955/06/24  Date of referral:                  Reason for consult:  Facility Placement                Permission sought to share information with:  Facility Art therapist granted to share information::  Yes, Release of Information Signed  Name::        Agency::  SNF  Relationship::     Contact Information:     Housing/Transportation Living arrangements for the past 2 months:  Single Family Home Source of Information:  Adult Children, Patient Patient Interpreter Needed:  None Criminal Activity/Legal Involvement Pertinent to Current Situation/Hospitalization:  No - Comment as needed Significant Relationships:  Adult Children, Other Family Members Lives with:  Spouse Do you feel safe going back to the place where you live?  No Need for family participation in patient care:  Yes (Comment)  Care giving concerns:  Pt from home with spouse and with new impairment will need SNF. Pt has been to SNF for an impairment back in November 2018. Pt was not pleased with SNF she was at previous and would like other facilities. Daughter is concerned about patient using the remaining SNF days out of the 100 and not having enough time in rehabilitation before having to pay. CSW validated her concerns and reiterated that the disposition would be for short term placement.  Daughter indicated that [facility], advised her that we need to use the language of "LTC or acute rehab" so that she would be covered for Skilled nursing. CSW indicated that is what we refer to from the hospital for short term rehab which is the same as acute rehab.  CSW then asked if daughter is looking for long term care-LTC and she said no, but that is the wording that Insurance company advised her need to be specified. CSW indicated that patient would need medicaid to cover any LTC bed and that process would need  to happen by family. Daughter indicated that she has already applied for medicaid and it is pending. CSW also indicated that if patient gets medicaid then she would be limited by nursing facilities that only accept medicaid.  CSW indicated that patient would go to SNF for the time needed and while daughter is working on [employer] process.  CSW validated daughters concerns about SNF placement/process. CSW would f/u to assist with SNF placement.  Social Worker assessment / plan:  Send out to SNF's in [place] once PT/OT evaluated. CSW will f/u for disposition.  Employment status:  Retired Forensic scientist:  Medicare PT Recommendations:  Not assessed at this time [place] / Referral to community resources:  Other (Comment Required)(Deferred, awaiting PT/OT Evaluations)  Patient/Family's Response to care:  Patient/daughter is concerned about SNF placement as she was not pleased with last facility. CSW validated and will provide other SNF's that can offer placement once evaluated by PT/OT.  Patient/Family's Understanding of and Emotional Response to Diagnosis, Current Treatment, and Prognosis: Patient/family acknowledge the need for SNF as patient cannot be cared for at home with new impairment. Family agreement with SNF and has experience with it in the past. Family is concerned about covering patient for SNF days if she uses her 100 days left. CSW validated. Daughter has applied for medicaid. CSW will continue to assist with disposition as patient will need SNF prior  to returning home.  Emotional Assessment Appearance:  Appears older than stated age Attitude/Demeanor/Rapport:  Lethargic Affect (typically observed):  Quiet(Resting) Orientation:  Oriented to Self, Oriented to Place, Oriented to  Time, Oriented to Situation Alcohol / Substance use:  Not Applicable Psych involvement (Current and /or in the community):  No (Comment)  Discharge Needs  Concerns to be addressed:   Discharge Planning Concerns Readmission within the last 30 days:  No Current discharge risk:  Dependent with Mobility, Physical Impairment Barriers to Discharge:  No Barriers Identified   Normajean Baxter, LCSW 05/06/2017, 3:00 PM

## 2017-05-06 NOTE — Telephone Encounter (Signed)
Please see below and advise. Thanks

## 2017-05-07 ENCOUNTER — Encounter (HOSPITAL_COMMUNITY): Payer: Self-pay | Admitting: Orthopedic Surgery

## 2017-05-07 DIAGNOSIS — Z89512 Acquired absence of left leg below knee: Secondary | ICD-10-CM

## 2017-05-07 DIAGNOSIS — R001 Bradycardia, unspecified: Secondary | ICD-10-CM | POA: Diagnosis present

## 2017-05-07 DIAGNOSIS — I2510 Atherosclerotic heart disease of native coronary artery without angina pectoris: Secondary | ICD-10-CM

## 2017-05-07 DIAGNOSIS — F32A Depression, unspecified: Secondary | ICD-10-CM | POA: Diagnosis present

## 2017-05-07 DIAGNOSIS — I890 Lymphedema, not elsewhere classified: Secondary | ICD-10-CM

## 2017-05-07 DIAGNOSIS — F329 Major depressive disorder, single episode, unspecified: Secondary | ICD-10-CM | POA: Diagnosis present

## 2017-05-07 LAB — URINALYSIS, ROUTINE W REFLEX MICROSCOPIC
Bilirubin Urine: NEGATIVE
Glucose, UA: NEGATIVE mg/dL
Hgb urine dipstick: NEGATIVE
Ketones, ur: NEGATIVE mg/dL
LEUKOCYTES UA: NEGATIVE
Nitrite: NEGATIVE
PROTEIN: NEGATIVE mg/dL
Specific Gravity, Urine: 1.009 (ref 1.005–1.030)
pH: 5 (ref 5.0–8.0)

## 2017-05-07 LAB — TROPONIN I
Troponin I: <0.03 ng/mL (ref <0.03)
Troponin I: <0.03 ng/mL (ref <0.03)
Troponin I: <0.03 ng/mL (ref <0.03)

## 2017-05-07 LAB — TSH: TSH: 0.28 u[IU]/mL — AB (ref 0.350–4.500)

## 2017-05-07 NOTE — Progress Notes (Signed)
Had call CCMD to let RN know if HR less than 45 and CCMD called sev. Times; CCMD called at this time HR 39; RN checking heart monitor on the floor and pt.'s HR 41-42- L. Irena Reichmann, [place] paged again at 0305- no RTC and paged [phone], RTC and informed of present HR; she will have Hospitalist see pt.; pt. resting no problems noted and c/o's chest pain.

## 2017-05-07 NOTE — Plan of Care (Signed)
  Nutrition: Adequate nutrition will be maintained 05/07/2017 1516 - Progressing by Williams Che, RN   Elimination: Will not experience complications related to bowel motility 05/07/2017 1516 - Progressing by Williams Che, RN   Pain Managment: General experience of comfort will improve 05/07/2017 1516 - Progressing by Williams Che, RN   Safety: Ability to remain free from injury will improve 05/07/2017 1516 - Progressing by Williams Che, RN   Skin Integrity: Risk for impaired skin integrity will decrease 05/07/2017 1516 - Progressing by Williams Che, RN

## 2017-05-07 NOTE — Progress Notes (Signed)
Dr. Blaine Hamper in to see pt.- talked with pt. and daughter.

## 2017-05-07 NOTE — Progress Notes (Signed)
Physical Therapy Evaluation Patient Details Name: Julia West MRN: [id number] DOB: 11/11/1955 Today's Date: 05/07/2017   History of Present Illness  Julia West is a 62 y/o female admitted on 05/06/17 for L transtibial amputation s/p wound dehiscence after reconstruction for Charcot collapse with worsening gangrenous change to L foot. Patient with a PMH significant for MI, anxiety, CAD, HTN, lymphedema.  Clinical Impression  Patient admitted with the above listed diagnosis. L LE BKA after reduced healing and wound dehiscence s/p reconstruction for Charcot collapse approx. 2.5 months ago. Patient motivated to work with PT today. Session focusing on improving independence with bed mobility and transfers with patient performing supine to sit EOB. Does require Min A for L LE management due to increased pain with active movements. Did not progress transfer training due to patients request and reports of increased phantom pain. Will continue to follow acutely to maximize functional mobility prior to d/c.      Follow Up Recommendations SNF    Equipment Recommendations  Other (comment)(will determine at next venue of care)    Recommendations for Other Services       Precautions / Restrictions Precautions Precautions: Fall Restrictions Weight Bearing Restrictions: Yes LLE Weight Bearing: Non weight bearing      Mobility  Bed Mobility Overal bed mobility: Needs Assistance Bed Mobility: Supine to Sit;Sit to Supine     Supine to sit: Min assist Sit to supine: Min assist   General bed mobility comments: Min A for assistance with residual limb due to pain  Transfers                 General transfer comment: patient requesting to hold transfers at this time due to phantom pain of L LE  Ambulation/Gait                Stairs            Wheelchair Mobility    Modified Rankin (Stroke Patients Only)       Balance Overall balance assessment: Needs  assistance Sitting-balance support: Single extremity supported Sitting balance-Leahy Scale: Good                                       Pertinent Vitals/Pain Pain Assessment: No/denies pain    Home Living Family/patient expects to be discharged to:: Skilled nursing facility Living Arrangements: Spouse/significant other   Type of Home: House Home Access: Stairs to enter Entrance Stairs-Rails: Can reach both Entrance Stairs-Number of Steps: 6 Home Layout: One level        Prior Function Level of Independence: Independent with assistive device(s)         Comments: ambulation with SPC     Hand Dominance   Dominant Hand: Right    Extremity/Trunk Assessment   Upper Extremity Assessment Upper Extremity Assessment: Overall WFL for tasks assessed    Lower Extremity Assessment Lower Extremity Assessment: Generalized weakness    Cervical / Trunk Assessment Cervical / Trunk Assessment: Normal  Communication   Communication: No difficulties  Cognition Arousal/Alertness: Awake/alert Behavior During Therapy: WFL for tasks assessed/performed Overall Cognitive Status: Within Functional Limits for tasks assessed                                        General Comments      Exercises  Assessment/Plan    PT Assessment Patient needs continued PT services  PT Problem List Decreased strength;Decreased range of motion;Decreased activity tolerance;Decreased balance;Decreased mobility;Decreased knowledge of use of DME;Pain       PT Treatment Interventions DME instruction;Gait training;Functional mobility training;Therapeutic activities;Therapeutic exercise;Balance training;Patient/family education    PT Goals (Current goals can be found in the Care Plan section)  Acute Rehab PT Goals Patient Stated Goal: regain mobility PT Goal Formulation: With patient Time For Goal Achievement: 05/21/17 Potential to Achieve Goals: Good    Frequency  Min 2X/week   Barriers to discharge Inaccessible home environment 6 stairs to enter home    Co-evaluation               AM-PAC PT "6 Clicks" Daily Activity  Outcome Measure Difficulty turning over in bed (including adjusting bedclothes, sheets and blankets)?: Unable Difficulty moving from lying on back to sitting on the side of the bed? : Unable Difficulty sitting down on and standing up from a chair with arms (e.g., wheelchair, bedside commode, etc,.)?: Unable Help needed moving to and from a bed to chair (including a wheelchair)?: A Lot Help needed walking in hospital room?: Total Help needed climbing 3-5 steps with a railing? : Total 6 Click Score: 7    End of Session   Activity Tolerance: Patient tolerated treatment well Patient left: in bed;with call bell/phone within reach;with family/visitor present;with SCD's reapplied Nurse Communication: Mobility status PT Visit Diagnosis: Unsteadiness on feet (R26.81);Other abnormalities of gait and mobility (R26.89);Muscle weakness (generalized) (M62.81);Difficulty in walking, not elsewhere classified (R26.2)    Time: 1040-1110 PT Time Calculation (min) (ACUTE ONLY): 30 min   Charges:   PT Evaluation $PT Eval Moderate Complexity: 1 Mod PT Treatments $Therapeutic Activity: 8-22 mins   PT G Codes:         Lanney Gins, PT, DPT 05/07/17 12:00 PM

## 2017-05-07 NOTE — Progress Notes (Signed)
Pt.'s HR 48-50's on pulse oximetry machine; paged Lanier Clam.PA and RTC 0130 stated to hold off on the Dilaudid and just give Oxyir the rest of the shift; 0155 called PA back daughter with pt. and wants her on telemetry and order given for tele.

## 2017-05-07 NOTE — Plan of Care (Signed)
  Education: Knowledge of General Education information will improve 05/07/2017 0515 - Progressing by Anson Fret, RN Note POC and orders reviewed with pt./daughter.

## 2017-05-07 NOTE — Progress Notes (Signed)
   Follow Up Note  62 yr old female s/p L BKA on 05/06/17, overnight had episodes of bradycardia. Hospitalist consulted to assist in managing bradycardia likely due to dilaudid + possible morbid obesity + BB. Pt BB was held and should be discontinued upon discharge. Pt still asymptomatic, denies any dizziness, pre-syncopal/syncopal episodes, chest pain, SOB, fever/chills. Reported some discomfort with urination. Pt and daughter insisted on cardiology consult due to hx of CABG. Cardiology consulted.   Exam: CV: S1, S2 present Lungs: CTA B/L Abd: Soft, NT, ND, BS+ Ext: L BKA ace wrapped  Present on Admission: . Gangrene of left foot ([place]) . Bradycardia . Hyperlipemia . CAD (coronary artery disease) . Depression . Essential hypertension   Disposition: Continue present management.

## 2017-05-07 NOTE — Progress Notes (Signed)
Subjective: 1 Day Post-Op Procedure(s) (LRB): BELOW KNEE AMPUTATION (Left) Patient reports pain as mild.  hospitalist consulted last night due to bradycardia.  Patient continues to be asymptomatic.  HR 48 this am.  Objective: Vital signs in last 24 hours: Temp:  [96.8 F (36 C)-98.6 F (37 C)] 98.1 F (36.7 C) (02/23 0752) Pulse Rate:  [48-103] 48 (02/23 0752) Resp:  [12-23] 15 (02/22 1216) BP: (96-153)/(34-81) 128/58 (02/23 0752) SpO2:  [92 %-100 %] 95 % (02/23 0752)  Intake/Output from previous day: 02/22 0701 - 02/23 0700 In: 900 [I.V.:900] Out: 100 [Blood:100] Intake/Output this shift: No intake/output data recorded.  Recent Labs    05/06/17 0828  HGB 13.3   Recent Labs    05/06/17 0828  WBC 12.4*  RBC 4.84  HCT 41.6  PLT 366   Recent Labs    05/06/17 0828  NA 136  K 3.7  CL 101  CO2 21*  BUN 26*  CREATININE 0.92  GLUCOSE 94  CALCIUM 9.7   No results for input(s): LABPT, INR in the last 72 hours.  Neurologically intact Neurovascular intact Sensation intact distally Intact pulses distally Dorsiflexion/Plantar flexion intact Incision: dressing C/D/I No cellulitis present Compartment soft  Assessment/Plan: 1 Day Post-Op Procedure(s) (LRB): BELOW KNEE AMPUTATION (Left) Advance diet Up with therapy  Continue wound vac NWB LLE OK FOR SPONGE BATH Continue plan per medicine regarding bradycardia *Dr. Sharol Given, patient and daughter wondering about obtaining a brace to help keep operative extremity in full extension to avoid flexion contracture.  Please adviseAundra Dubin 05/07/2017, 8:13 AM

## 2017-05-07 NOTE — Consult Note (Signed)
Cardiology Consultation:   Patient ID: Julia West; [id number]; [birth year]   Admit date: 05/06/2017 Date of Consult: 05/07/2017  Primary Care Provider: [name], MD Primary Cardiologist: No primary care provider on file. Dr. Percival Spanish Primary Electrophysiologist:  none   Patient Profile:   Julia West is a 62 y.o. female with CAD who is being seen today for the evaluation of bradycardia at the request of Dr. Sharol Given.  History of Present Illness:   Julia West is a 62 year old female with coronary artery disease post ruptured plaque in the circumflex in 2003, acute myocardial infarction at that time with subsequent CABG, LIMA to LAD, RIMA to circumflex with stress test in 2013 low risk showing a potential small distal inferolateral ischemic defect here with wound dehiscence and right heel eschar post reconstruction due to Charcot collapse.  She had progressive black gangrenous changes to the heel pad.  She underwent surgical procedure by Dr. Sharol Given to yesterday morning.  Subsequently, she was placed on Dilaudid for pain control.  In the middle the night, she had periodic heart rates in the upper 30s, low 40s.  Asymptomatic.  No prior history of syncope.  No chest pain, no shortness of breath.  We were asked to see her because of the transient bradycardia.  Past Medical History:  Diagnosis Date  . Acute myocardial infarction    with a ruptured plaque in the circumflex in 2003  . Anxiety   . Cancer ([place])    basal cell face  . Complication of anesthesia    states low O2 sats post-op 11/13, always slow to awaken  . Coronary artery disease   . Degenerative joint disease   . Dyspnea    with activity- car to house - steps  . Heart murmur    "not to be concerned"  . HTN (hypertension)   . Hyperlipidemia LDL goal <70   . Internal and external hemorrhoids without complication   . Lymphedema    right side of body  . Lymphedema of arm    right  . Morbid obesity ([place])     . Surgical wound, non healing ABDOMINAL    has wound vac @ 125 mm Hg    Past Surgical History:  Procedure Laterality Date  . AMPUTATION Left 05/06/2017   Procedure: BELOW KNEE AMPUTATION;  Surgeon: Newt Minion, MD;  Location: [facility];  Service: Orthopedics;  Laterality: Left;  . APPLICATION OF WOUND VAC    . BOWEL RESECTION  02/07/2012   Procedure: SMALL BOWEL RESECTION;  Surgeon: Gayland Curry, MD,FACS;  Location: [facility];  Service: General;;  . CARDIOVASCULAR STRESS TEST  01-17-2012  DR HOCHREIN   LOW RISK NUCLEAR TEST  . CESAREAN SECTION     x 4 in remote past  . CHOLECYSTECTOMY    . CORONARY ARTERY BYPASS GRAFT  2003   by Dr. Servando Snare. LIMA to the LAD, free RIMA to the circumflex. Stress perfusion study December 2009 with no high-risk areas of ischemia. She has a well-preserved ejection fraction  . HYSTEROSCOPY W/D&C N/A 05/14/2014   Procedure: DILATATION AND CURETTAGE (no specimen); HYSTEROSCOPY;  Surgeon: Jonnie Kind, MD;  Location: [facility];  Service: Gynecology;  Laterality: N/A;  . INCISION AND DRAINAGE OF WOUND  04/17/2012   Procedure: IRRIGATION AND DEBRIDEMENT WOUND;  Surgeon: Theodoro Kos, DO;  Location: [facility];  Service: Plastics;  Laterality: N/A;  OF ABDOMINAL WOUND, SURGICAL PREP AND PLACEMENT OF VAC, REMOVAL FOREHEAD SKIN LESION  . INCISION  AND DRAINAGE OF WOUND N/A 04/24/2012   Procedure: IRRIGATION AND DEBRIDEMENT WOUND;  Surgeon: Theodoro Kos, DO;  Location: [facility];  Service: Plastics;  Laterality: N/A;  I & D ABDOMINAL WOUND WITH VAC AND ACELL  . INCISION AND DRAINAGE OF WOUND N/A 05/01/2012   Procedure: IRRIGATION AND DEBRIDEMENT WOUND;  Surgeon: Theodoro Kos, DO;  Location: [facility];  Service: Plastics;  Laterality: N/A;  WITH SURGICAL PREP AND PLACEMENT OF VAC  . INCISION AND DRAINAGE OF WOUND N/A 05/08/2012   Procedure: IRRIGATION AND DEBRIDEMENT OF ABD WOUND SURGICAL PREP AND PLACEMENT OF VAC ;  Surgeon:  Theodoro Kos, DO;  Location: [facility];  Service: Plastics;  Laterality: N/A;  IRRIGATION AND DEBRIDEMENT OF ABD WOUND SURGICAL PREP AND PLACEMENT OF VAC   . INCISION AND DRAINAGE OF WOUND N/A 05/15/2012   Procedure: IRRIGATION AND DEBRIDEMENT OF ABDOMINAL WOUND WITH POSSIBLE SURGICAL PREP AND PLACEMENT OF VAC;  Surgeon: Theodoro Kos, DO;  Location: [facility];  Service: Plastics;  Laterality: N/A;  . INCISION AND DRAINAGE OF WOUND N/A 05/22/2012   Procedure: IRRIGATION AND DEBRIDEMENT OF ABODOMINAL WOUND WITH  SURGICAL PREP AND VAC PLACEMENT;  Surgeon: Theodoro Kos, DO;  Location: [facility];  Service: Plastics;  Laterality: N/A;  . INCISION AND DRAINAGE OF WOUND N/A 06/28/2012   Procedure: IRRIGATION AND DEBRIDEMENT OF ABDOMINAL ULCER SURGICAL PREP AND PLACEMENT OF ACELL AND VAC;  Surgeon: Theodoro Kos, DO;  Location: [facility];  Service: Plastics;  Laterality: N/A;  . INCISIONAL HERNIA REPAIR  02/07/2012   Procedure: HERNIA REPAIR INCISIONAL;  Surgeon: Gayland Curry, MD,FACS;  Location: [facility];  Service: General;;  Open, Primary repair, strangulated Incisional hernia.  Marland Kitchen LESION REMOVAL  04/17/2012   Procedure: LESION REMOVAL;  Surgeon: Theodoro Kos, DO;  Location: [facility];  Service: Plastics;  Laterality: N/A;   CENTER OF FOREHEAD, REMOVAL FORHEAD SKIN LESION  . ORIF TOE FRACTURE Left 02/09/2017   Procedure: EXCISION TALAR HEAD, INTERNAL FIXATION MEDIAL COLUMN LEFT FOOT;  Surgeon: Newt Minion, MD;  Location: [facility];  Service: Orthopedics;  Laterality: Left;  . POLYPECTOMY N/A 05/14/2014   Procedure: ENDOMETRIAL POLYPECTOMY;  Surgeon: Jonnie Kind, MD;  Location: [facility];  Service: Gynecology;  Laterality: N/A;     Home Medications:  Prior to Admission medications   Medication Sig Start Date End Date Taking? Authorizing Provider  aspirin 81 MG tablet Take 81 mg by mouth daily.    Yes [provider]    buPROPion (WELLBUTRIN) 100 MG tablet Take 50 mg by mouth daily.   Yes [provider]  docusate sodium (COLACE) 50 MG capsule Take 50 mg by mouth daily as needed for mild constipation.   Yes [provider]  fenofibrate 160 MG tablet TAKE 1 TABLET BY MOUTH  DAILY FOR CHOLESTEROL AND  TRIGLYCERIDES Patient taking differently: Take 160 mg by mouth daily.  12/10/16  Yes Stacks, Cletus Gash, MD  furosemide (LASIX) 20 MG tablet TAKE 1 TABLET BY MOUTH  DAILY Patient taking differently: Take 20 mg by mouth daily 08/04/16  Yes [name], MD  HYDROcodone-acetaminophen (NORCO/VICODIN) 5-325 MG tablet Take 1 tablet by mouth every 4 (four) hours as needed for moderate pain or severe pain ((score 4 to 6)). 02/14/17  Yes Newt Minion, MD  metoprolol tartrate (LOPRESSOR) 25 MG tablet Take 0.5 tablets (12.5 mg total) by mouth 2 (two) times daily. 02/08/17  Yes Barrett, Suanne Marker  G, PA-C  nitroGLYCERIN (NITROSTAT) 0.4 MG SL tablet Place 1 tablet (0.4 mg total) under the tongue every 5 (five) minutes x 3 doses as needed for chest pain. If pain persist after call 911 01/08/16 05/02/17 Yes Hochrein, Jeneen Rinks, MD  sertraline (ZOLOFT) 50 MG tablet Take 50 mg by mouth daily.   Yes [provider]  silver sulfADIAZINE (SILVADENE) 1 % cream Apply 1 application topically daily. 04/12/17  Yes Newt Minion, MD  traMADol (ULTRAM) 50 MG tablet Take 100 mg by mouth every 6 (six) hours as needed for moderate pain.   Yes [provider]  traZODone (DESYREL) 150 MG tablet TAKE 1 OR 2 TABLETS BY  MOUTH AT BEDTIME AS NEEDED  FOR SLEEP Patient taking differently: Take 150 mg by mouth at bedtime 01/13/17  Yes Stacks, Cletus Gash, MD  valsartan-hydrochlorothiazide (DIOVAN-HCT) 160-12.5 MG tablet Take 1 tablet by mouth daily. 12/10/16  Yes Stacks, Cletus Gash, MD  mupirocin ointment (BACTROBAN) 2 % Apply 1 application topically 2 (two) times daily. Patient not taking: Reported on 05/02/2017 03/10/17   Leandrew Koyanagi, MD     Inpatient Medications: Scheduled Meds: . aspirin EC  81 mg Oral Daily  . buPROPion  50 mg Oral Daily  . docusate sodium  100 mg Oral BID  . fenofibrate  160 mg Oral Daily  . furosemide  20 mg Oral Daily  . gabapentin  300 mg Oral TID  . irbesartan  150 mg Oral Daily   And  . hydrochlorothiazide  12.5 mg Oral Daily  . sertraline  50 mg Oral Daily   Continuous Infusions: . sodium chloride 10 mL/hr at 05/06/17 1330  . methocarbamol (ROBAXIN)  IV     PRN Meds: acetaminophen **OR** acetaminophen, bisacodyl, HYDROmorphone (DILAUDID) injection, magnesium citrate, methocarbamol **OR** methocarbamol (ROBAXIN)  IV, metoCLOPramide **OR** metoCLOPramide (REGLAN) injection, nitroGLYCERIN, ondansetron **OR** ondansetron (ZOFRAN) IV, oxyCODONE, oxyCODONE, polyethylene glycol, traZODone  Allergies:    Allergies  Allergen Reactions  . Ace Inhibitors Cough    Social History:   Social History   Socioeconomic History  . Marital status: Married    Spouse name: Not on file  . Number of children: 5  . Years of education: Not on file  . Highest education level: Not on file  Social Needs  . Financial resource strain: Not on file  . Food insecurity - worry: Not on file  . Food insecurity - inability: Not on file  . Transportation needs - medical: Not on file  . Transportation needs - non-medical: Not on file  Occupational History  . Occupation: Estate manager/land agent    Comment: In the [employer]  Tobacco Use  . Smoking status: Former Smoker    Packs/day: 1.00    Years: 25.00    Pack years: 25.00    Types: Cigarettes    Last attempt to quit: 11/17/2011    Years since quitting: 5.4  . Smokeless tobacco: Never Used  Substance and Sexual Activity  . Alcohol use: No  . Drug use: No  . Sexual activity: Yes    Birth control/protection: Post-menopausal  Other Topics Concern  . Not on file  Social History Narrative   Lives in [place] with her husband. 5 children. One of whom has pulmonary  stenosis. Her only exercise is at work    Family History:    Family History  Problem Relation Age of Onset  . Heart attack Mother   . Hypertension Sister   . Diabetes Brother   . Birth defects Son  heart defect     ROS:  Please see the history of present illness.   All other ROS reviewed and negative.     Physical Exam/Data:   Vitals:   05/07/17 0116 05/07/17 0429 05/07/17 0752 05/07/17 0900  BP: (!) 119/37 96/69 (!) 128/58 (!) 144/42  Pulse: (!) 49 (!) 51 (!) 48 (!) 54  Resp:      Temp: 98 F (36.7 C) 98.6 F (37 C) 98.1 F (36.7 C)   TempSrc: Oral Oral Oral   SpO2: 97% 99% 95%   Weight:       No intake or output data in the 24 hours ending 05/07/17 1244 Filed Weights   05/06/17 0805  Weight: 280 lb (127 kg)   Body mass index is 45.19 kg/m.  General:  Well nourished, well developed, in no acute distress, obese HEENT: normal Lymph: no adenopathy Neck: no JVD Endocrine:  No thryomegaly Vascular: No carotid bruits; FA pulses 2+ bilaterally without bruits  Cardiac:  normal S1, S2; RRR; no murmur  Lungs:  clear to auscultation bilaterally, no wheezing, rhonchi or rales  Abd: soft, nontender, no hepatomegaly  Ext: Right postoperative leg changes noted Musculoskeletal:  Post surgical change noted Skin: warm and dry  Neuro:  CNs 2-12 intact, no focal abnormalities noted Psych:  Normal affect   EKG:  The EKG was personally reviewed and demonstrates: Sinus bradycardia rate 48 with PVC, no ischemic changes. Telemetry:  Telemetry was personally reviewed and demonstrates: Transient heart rates with sinus bradycardia in the upper 30s, low 40s in the middle of the night, currently sinus rhythm in the 60s.  No pauses.  Relevant CV Studies:  Nuclear stress test 2013 overall low risk with small area of possible inferolateral ischemia.  Laboratory Data:  Chemistry Recent Labs  Lab 05/06/17 0828  NA 136  K 3.7  CL 101  CO2 21*  GLUCOSE 94  BUN 26*    CREATININE 0.92  CALCIUM 9.7  GFRNONAA >60  GFRAA >60  ANIONGAP 14    No results for input(s): PROT, ALBUMIN, AST, ALT, ALKPHOS, BILITOT in the last 168 hours. Hematology Recent Labs  Lab 05/06/17 0828  WBC 12.4*  RBC 4.84  HGB 13.3  HCT 41.6  MCV 86.0  MCH 27.5  MCHC 32.0  RDW 16.1*  PLT 366   Cardiac Enzymes Recent Labs  Lab 05/07/17 0504 05/07/17 1045  TROPONINI <0.03 <0.03   No results for input(s): TROPIPOC in the last 168 hours.  BNPNo results for input(s): BNP, PROBNP in the last 168 hours.  DDimer No results for input(s): DDIMER in the last 168 hours.  Radiology/Studies:  No results found.  Assessment and Plan:   Transient bradycardia -Agree with stopping low-dose metoprolol.  I would not restart this medication at home.  It is likely that her pain, sleep, Dilaudid all potentiated her bradycardic response last night.  Morbid obesity as well is typically associated with nocturnal bradycardia given its increased vagal tone effect.  Her early morning transient bradycardia as well is not unusual.  Overall reassurance has been given.  No further cardiac workup necessary.  Once again avoid beta-blockers.  Morbid obesity -Continue to encourage weight loss.  She snores. Encourage outpatient sleep evaluation. This plays a role as well in nocturnal bradycardia.   Gangrene postop below-knee amputation- Per primary team.  Please let us know we can be of further assistance.  We will sign off.  For questions or updates, please contact [facility] Please consult [email or web address]  for contact info under Cardiology/STEMI.   Signed, Candee Furbish, MD  05/07/2017 12:44 PM

## 2017-05-07 NOTE — Consult Note (Signed)
Medical Consultation   Julia West  UUV:[id number]  DOB: March 10, 1956  DOA: 05/06/2017  PCP: [name], MD   Outpatient Specialists:  Requesting physician: Lonell Grandchild  Reason for consultation: Bradycardia  History of Present Illness: Julia West is an 62 y.o. female hypertension, hyperlipidemia, depression, obesity, lymphedema, CAD, myocardial infarction, who was admitted by orthopedic surgeon and underwent left BKA yesterday. We were called to consult for pt's bradycardia.  Pt was s/p of left BKA yesterday. She has been doing fine post surgically except for pain. At about 9:20 PM, patient was given her regular metoprolol 12.5 mg per nurse. She developed bradycardia with heart rate down to 34. When I saw pt, her HR is 51. Pt is asymptomatic. Patient denies chest pain, shortness breath, palpitation. No fever or chills. Patient also denies nausea, vomiting, diarrhea, abdominal pain, symptoms of UTI or unilateral weakness. She complains of moderate pain is surgical site. Stat EKG showed sinus bradycardia, heart rate 48, Q wave in lead 3, mild T-wave inversion in V1-V3.  Review of Systems:   General: no fevers, chills, no changes in body weight, no changes in appetite Skin: no rash HEENT: no blurry vision, hearing changes or sore throat Pulm: no dyspnea, coughing, wheezing CV: no chest pain, palpitations, shortness of breath Abd: no nausea/vomiting, abdominal pain, diarrhea/constipation GU: no dysuria, hematuria, polyuria Ext: no arthralgias, myalgias. Neuro: no weakness, numbness, or tingling Musculoskeletal: s/p of L BKA with pain in surgical site.    Past Medical History: Past Medical History:  Diagnosis Date  . Acute myocardial infarction    with a ruptured plaque in the circumflex in 2003  . Anxiety   . Cancer ([place])    basal cell face  . Complication of anesthesia    states low O2 sats post-op 11/13, always slow to awaken  .  Coronary artery disease   . Degenerative joint disease   . Dyspnea    with activity- car to house - steps  . Heart murmur    "not to be concerned"  . HTN (hypertension)   . Hyperlipidemia LDL goal <70   . Internal and external hemorrhoids without complication   . Lymphedema    right side of body  . Lymphedema of arm    right  . Morbid obesity ([place])   . Surgical wound, non healing ABDOMINAL    has wound vac @ 125 mm Hg    Past Surgical History: Past Surgical History:  Procedure Laterality Date  . APPLICATION OF WOUND VAC    . BOWEL RESECTION  02/07/2012   Procedure: SMALL BOWEL RESECTION;  Surgeon: Gayland Curry, MD,FACS;  Location: [facility];  Service: General;;  . CARDIOVASCULAR STRESS TEST  01-17-2012  DR HOCHREIN   LOW RISK NUCLEAR TEST  . CESAREAN SECTION     x 4 in remote past  . CHOLECYSTECTOMY    . CORONARY ARTERY BYPASS GRAFT  2003   by Dr. Servando Snare. LIMA to the LAD, free RIMA to the circumflex. Stress perfusion study December 2009 with no high-risk areas of ischemia. She has a well-preserved ejection fraction  . HYSTEROSCOPY W/D&C N/A 05/14/2014   Procedure: DILATATION AND CURETTAGE (no specimen); HYSTEROSCOPY;  Surgeon: Jonnie Kind, MD;  [facility];  Service: Gynecology;  Laterality: N/A;  . INCISION AND DRAINAGE OF WOUND  04/17/2012   Procedure: IRRIGATION AND DEBRIDEMENT WOUND;  Surgeon: Theodoro Kos, DO;  Location: [facility];  Service: Plastics;  Laterality: N/A;  OF ABDOMINAL WOUND, SURGICAL PREP AND PLACEMENT OF VAC, REMOVAL FOREHEAD SKIN LESION  . INCISION AND DRAINAGE OF WOUND N/A 04/24/2012   Procedure: IRRIGATION AND DEBRIDEMENT WOUND;  Surgeon: Theodoro Kos, DO;  Location: [facility];  Service: Plastics;  Laterality: N/A;  I & D ABDOMINAL WOUND WITH VAC AND ACELL  . INCISION AND DRAINAGE OF WOUND N/A 05/01/2012   Procedure: IRRIGATION AND DEBRIDEMENT WOUND;  Surgeon: Theodoro Kos, DO;  Location: [facility];   Service: Plastics;  Laterality: N/A;  WITH SURGICAL PREP AND PLACEMENT OF VAC  . INCISION AND DRAINAGE OF WOUND N/A 05/08/2012   Procedure: IRRIGATION AND DEBRIDEMENT OF ABD WOUND SURGICAL PREP AND PLACEMENT OF VAC ;  Surgeon: Theodoro Kos, DO;  Location: [facility];  Service: Plastics;  Laterality: N/A;  IRRIGATION AND DEBRIDEMENT OF ABD WOUND SURGICAL PREP AND PLACEMENT OF VAC   . INCISION AND DRAINAGE OF WOUND N/A 05/15/2012   Procedure: IRRIGATION AND DEBRIDEMENT OF ABDOMINAL WOUND WITH POSSIBLE SURGICAL PREP AND PLACEMENT OF VAC;  Surgeon: Theodoro Kos, DO;  Location: [facility];  Service: Plastics;  Laterality: N/A;  . INCISION AND DRAINAGE OF WOUND N/A 05/22/2012   Procedure: IRRIGATION AND DEBRIDEMENT OF ABODOMINAL WOUND WITH  SURGICAL PREP AND VAC PLACEMENT;  Surgeon: Theodoro Kos, DO;  Location: [facility];  Service: Plastics;  Laterality: N/A;  . INCISION AND DRAINAGE OF WOUND N/A 06/28/2012   Procedure: IRRIGATION AND DEBRIDEMENT OF ABDOMINAL ULCER SURGICAL PREP AND PLACEMENT OF ACELL AND VAC;  Surgeon: Theodoro Kos, DO;  Location: [facility];  Service: Plastics;  Laterality: N/A;  . INCISIONAL HERNIA REPAIR  02/07/2012   Procedure: HERNIA REPAIR INCISIONAL;  Surgeon: Gayland Curry, MD,FACS;  Location: [facility];  Service: General;;  Open, Primary repair, strangulated Incisional hernia.  Marland Kitchen LESION REMOVAL  04/17/2012   Procedure: LESION REMOVAL;  Surgeon: Theodoro Kos, DO;  Location: [facility];  Service: Plastics;  Laterality: N/A;   CENTER OF FOREHEAD, REMOVAL FORHEAD SKIN LESION  . ORIF TOE FRACTURE Left 02/09/2017   Procedure: EXCISION TALAR HEAD, INTERNAL FIXATION MEDIAL COLUMN LEFT FOOT;  Surgeon: Newt Minion, MD;  Location: [facility];  Service: Orthopedics;  Laterality: Left;  . POLYPECTOMY N/A 05/14/2014   Procedure: ENDOMETRIAL POLYPECTOMY;  Surgeon: Jonnie Kind, MD;  Location: [facility];  Service: Gynecology;   Laterality: N/A;     Allergies:   Allergies  Allergen Reactions  . Ace Inhibitors Cough     Social History:  reports that she quit smoking about 5 years ago. Her smoking use included cigarettes. She has a 25.00 pack-year smoking history. she has never used smokeless tobacco. She reports that she does not drink alcohol or use drugs.   Family History: Family History  Problem Relation Age of Onset  . Heart attack Mother   . Hypertension Sister   . Diabetes Brother   . Birth defects Son        heart defect    Physical Exam: Vitals:   05/06/17 1644 05/06/17 2104 05/07/17 0116 05/07/17 0429  BP:  (!) 120/45 (!) 119/37 96/69  Pulse:  (!) 53 (!) 49 (!) 51  Resp:      Temp: (!) 96.8 F (36 C) 97.9 F (36.6 C) 98 F (36.7 C) 98.6 F (37 C)  TempSrc:  Oral Oral Oral  SpO2:  94% 97% 99%  Weight:       General: Not in acute distress HEENT: PERRL, EOMI, no scleral icterus, No JVD or bruit Cardiac: Q2/I2, RRR, 2/6 systolic murmurs, gallops or rubs Pulm: Clear to auscultation bilaterally. No rales, wheezing, rhonchi or rubs. Abd: Soft, nondistended, nontender, no rebound pain, no organomegaly, BS present Ext: has trace leg edema. 2+DP/PT pulse in R leg, s/p of L BKA. Musculoskeletal: No joint deformities, erythema, or stiffness, ROM full Skin: No rashes.  Neuro: Alert and oriented X3, cranial nerves II-XII grossly intact, muscle strength 5/5 in all extremeties Psych: Patient is not psychotic, no suicidal or hemocidal ideation.  Data reviewed:  I have personally reviewed following labs and imaging studies Labs:  CBC: Recent Labs  Lab 05/06/17 0828  WBC 12.4*  HGB 13.3  HCT 41.6  MCV 86.0  PLT 979    Basic Metabolic Panel: Recent Labs  Lab 05/06/17 0828  NA 136  K 3.7  CL 101  CO2 21*  GLUCOSE 94  BUN 26*  CREATININE 0.92  CALCIUM 9.7   GFR Estimated Creatinine Clearance: 84.3 mL/min (by C-G formula based on SCr of 0.92 mg/dL). Liver Function Tests: No  results for input(s): AST, ALT, ALKPHOS, BILITOT, PROT, ALBUMIN in the last 168 hours. No results for input(s): LIPASE, AMYLASE in the last 168 hours. No results for input(s): AMMONIA in the last 168 hours. Coagulation profile No results for input(s): INR, PROTIME in the last 168 hours.  Cardiac Enzymes: No results for input(s): CKTOTAL, CKMB, CKMBINDEX, TROPONINI in the last 168 hours. BNP: Invalid input(s): POCBNP CBG: No results for input(s): GLUCAP in the last 168 hours. D-Dimer No results for input(s): DDIMER in the last 72 hours. Hgb A1c No results for input(s): HGBA1C in the last 72 hours. Lipid Profile No results for input(s): CHOL, HDL, LDLCALC, TRIG, CHOLHDL, LDLDIRECT in the last 72 hours. Thyroid function studies No results for input(s): TSH, T4TOTAL, T3FREE, THYROIDAB in the last 72 hours.  Invalid input(s): FREET3 Anemia work up No results for input(s): VITAMINB12, FOLATE, FERRITIN, TIBC, IRON, RETICCTPCT in the last 72 hours. Urinalysis    Component Value Date/Time   COLORURINE YELLOW 05/10/2014 1039   APPEARANCEUR Clear 12/10/2016 1203   LABSPEC 1.015 05/10/2014 1039   PHURINE 5.0 05/10/2014 1039   GLUCOSEU Negative 12/10/2016 1203   HGBUR LARGE (A) 05/10/2014 1039   BILIRUBINUR Negative 12/10/2016 1203   KETONESUR NEGATIVE 05/10/2014 1039   PROTEINUR Negative 12/10/2016 1203   PROTEINUR NEGATIVE 05/10/2014 1039   UROBILINOGEN 0.2 05/10/2014 1039   NITRITE Negative 12/10/2016 1203   NITRITE NEGATIVE 05/10/2014 1039   LEUKOCYTESUR Trace (A) 12/10/2016 1203     Microbiology No results found for this or any previous visit (from the past 240 hour(s)).     Inpatient Medications:   Scheduled Meds: . aspirin EC  81 mg Oral Daily  . buPROPion  50 mg Oral Daily  . docusate sodium  100 mg Oral BID  . fenofibrate  160 mg Oral Daily  . furosemide  20 mg Oral Daily  . gabapentin  300 mg Oral TID  . irbesartan  150 mg Oral Daily   And  .  hydrochlorothiazide  12.5 mg Oral Daily  . sertraline  50 mg Oral Daily   Continuous Infusions: . sodium chloride 10 mL/hr at 05/06/17 1330  . methocarbamol (ROBAXIN)  IV       Radiological Exams on Admission: No results found.  Impression/Recommendations Principal Problem:   S/P BKA (below knee amputation) unilateral,  left [facility]) Active Problems:   Hyperlipemia   Essential hypertension   CAD (coronary artery disease)   Gangrene of left foot (HCC)   Wound dehiscence, surgical, sequela   Bradycardia   Depression   Lymphedema  Bradycardia: HR down to 34-->50s now. Patient is asymptomatic. Stat EKG showed sinus bradycardia, heart rate 48, Q wave in lead 3, mild T-wave inversion in V1-V3. Most likely due to metoprolol use and IV narcotic pain medication use.  -oberve on Tele -d/c'ed metoprolol -trop x 3 and TSH. -if significantly worsening or develop symptoms such as chest pain or SOB, will treat with glucagon  S/P BKA (below knee amputation) unilateral, left ([place]): -pain management per primary care team  HLD -fenofibrate  CAD (coronary artery disease): no CP -ASA and fenofibrate -Before meals metoprolol  Depression: Stable, no suicidal or homicidal ideations. -Continue home medications: Wellbutrin and Zoloft  HTN: -Irbesartan and HCTZ -IV hydralazine when necessary  Lymphedema: -Continue Lasix   Thank you for this consultation.  Our [facility] hospitalist team will follow the patient with you.   Time Spent: 30 min  Ivor Costa M.D. [facility] 05/07/2017, 5:40 AM

## 2017-05-08 DIAGNOSIS — E785 Hyperlipidemia, unspecified: Secondary | ICD-10-CM

## 2017-05-08 DIAGNOSIS — I890 Lymphedema, not elsewhere classified: Secondary | ICD-10-CM

## 2017-05-08 DIAGNOSIS — I10 Essential (primary) hypertension: Secondary | ICD-10-CM

## 2017-05-08 LAB — CBC WITH DIFFERENTIAL/PLATELET
Basophils Absolute: 0 10*3/uL (ref 0.0–0.1)
Basophils Relative: 0 %
EOS PCT: 4 %
Eosinophils Absolute: 0.4 10*3/uL (ref 0.0–0.7)
HCT: 34.7 % — ABNORMAL LOW (ref 36.0–46.0)
Hemoglobin: 10.8 g/dL — ABNORMAL LOW (ref 12.0–15.0)
LYMPHS PCT: 38 %
Lymphs Abs: 4.2 10*3/uL — ABNORMAL HIGH (ref 0.7–4.0)
MCH: 27.1 pg (ref 26.0–34.0)
MCHC: 31.1 g/dL (ref 30.0–36.0)
MCV: 87 fL (ref 78.0–100.0)
MONO ABS: 0.8 10*3/uL (ref 0.1–1.0)
Monocytes Relative: 7 %
Neutro Abs: 5.6 10*3/uL (ref 1.7–7.7)
Neutrophils Relative %: 51 %
PLATELETS: 336 10*3/uL (ref 150–400)
RBC: 3.99 MIL/uL (ref 3.87–5.11)
RDW: 16.2 % — ABNORMAL HIGH (ref 11.5–15.5)
WBC: 11 10*3/uL — ABNORMAL HIGH (ref 4.0–10.5)

## 2017-05-08 LAB — URINE CULTURE: Culture: NO GROWTH

## 2017-05-08 MED ORDER — ENOXAPARIN SODIUM 60 MG/0.6ML ~~LOC~~ SOLN
60.0000 mg | SUBCUTANEOUS | Status: DC
  Administered 2017-05-08 – 2017-05-11 (×4): 60 mg via SUBCUTANEOUS
  Filled 2017-05-08 (×4): qty 0.6

## 2017-05-08 NOTE — NC FL2 (Signed)
[place] MEDICAID FL2 LEVEL OF CARE SCREENING TOOL     IDENTIFICATION  Patient Name: Julia West Birthdate: 16-Mar-1955 Sex: female Admission Date (Current Location): 05/06/2017  [facility] and [place] Number:  Herbalist and Address:  The . [facility], [place] [address], [place], [place] [address]      Provider Number: [id number]  Attending Physician Name and Address:  Newt Minion, MD  Relative Name and Phone Number:  Darl Pikes, daughter, [phone]    Current Level of Care: Hospital Recommended Level of Care: [place] Prior Approval Number:    Date Approved/Denied:   PASRR Number: [id number]  Discharge Plan: SNF    Current Diagnoses: Patient Active Problem List   Diagnosis Date Noted  . Bradycardia 05/07/2017  . Depression 05/07/2017  . Lymphedema 05/07/2017  . S/P BKA (below knee amputation) unilateral, left ([place]) 05/06/2017  . Gangrene of left foot ([place])   . Wound dehiscence, surgical, sequela   . Charcot foot due to diabetes mellitus ([place]) 02/09/2017  . 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 Post-menopausal bleeding 04/15/2014  . Endometrial polyp 04/09/2014  . Ventral hernia 03/06/2012  . Open abdominal wall wound 03/06/2012  . Hyperlipemia 11/12/2008  . OBESITY, MORBID 11/12/2008  . Essential hypertension 11/12/2008  . CAD (coronary artery disease) 11/12/2008    Orientation RESPIRATION BLADDER Height & Weight     Self, Time, Situation, Place  O2(Nasal Cannula) Continent Weight: 280 lb (127 kg) Height:     BEHAVIORAL SYMPTOMS/MOOD NEUROLOGICAL BOWEL NUTRITION STATUS      Continent Diet(See DC Summary)  AMBULATORY STATUS COMMUNICATION OF NEEDS Skin   Extensive Assist Verbally Surgical wounds, Wound Vac                       Personal Care Assistance Level of Assistance  Bathing, Feeding, Dressing Bathing Assistance: Maximum assistance Feeding assistance:  Independent Dressing Assistance: Maximum assistance     Functional Limitations Info  Sight, Hearing, Speech Sight Info: Adequate Hearing Info: Adequate Speech Info: Adequate    SPECIAL CARE FACTORS FREQUENCY  PT (By licensed PT), OT (By licensed OT)     PT Frequency: 2x OT Frequency: 2x            Contractures Contractures Info: Not present    Additional Factors Info  Code Status, Allergies, Psychotropic Code Status Info: Full Allergies Info: ACE INHIBITORS  Psychotropic Info: Wellbutrim, Zoloft   Isolation Precautions Info: N/A     Current Medications (05/08/2017):  This is the current hospital active medication list Current Facility-Administered Medications  Medication Dose Route Frequency Provider Last Rate Last Dose  . 0.9 %  sodium chloride infusion   Intravenous Continuous Newt Minion, MD 10 mL/hr at 05/06/17 1330    . acetaminophen (TYLENOL) tablet 650 mg  650 mg Oral Q4H PRN Newt Minion, MD       Or  . acetaminophen (TYLENOL) suppository 650 mg  650 mg Rectal Q4H PRN Newt Minion, MD      . aspirin EC tablet 81 mg  81 mg Oral Daily Newt Minion, MD   81 mg at 05/08/17 0843  . bisacodyl (DULCOLAX) suppository 10 mg  10 mg Rectal Daily PRN Newt Minion, MD      . buPROPion [facility]) tablet 50 mg  50 mg Oral Daily Newt Minion, MD   50 mg at 05/08/17 0849  .  docusate sodium (COLACE) capsule 100 mg  100 mg Oral BID Newt Minion, MD   100 mg at 05/08/17 0841  . enoxaparin (LOVENOX) injection 60 mg  60 mg Subcutaneous Q24H Alma Friendly, MD   60 mg at 05/08/17 1313  . fenofibrate tablet 160 mg  160 mg Oral Daily Newt Minion, MD   160 mg at 05/08/17 0849  . furosemide (LASIX) tablet 20 mg  20 mg Oral Daily Newt Minion, MD   20 mg at 05/08/17 [phone]  . gabapentin (NEURONTIN) capsule 300 mg  300 mg Oral TID Newt Minion, MD   300 mg at 05/08/17 0841  . irbesartan (AVAPRO) tablet 150 mg  150 mg Oral Daily Newt Minion, MD   150 mg at  05/08/17 8115   And  . hydrochlorothiazide (MICROZIDE) capsule 12.5 mg  12.5 mg Oral Daily Newt Minion, MD   12.5 mg at 05/08/17 0841  . magnesium citrate solution 1 Bottle  1 Bottle Oral Once PRN Newt Minion, MD      . methocarbamol (ROBAXIN) tablet 500 mg  500 mg Oral Q6H PRN Newt Minion, MD   500 mg at 05/08/17 0343   Or  . methocarbamol (ROBAXIN) 500 mg in dextrose 5 % 50 mL IVPB  500 mg Intravenous Q6H PRN Newt Minion, MD      . metoCLOPramide (REGLAN) tablet 5-10 mg  5-10 mg Oral Q8H PRN Newt Minion, MD       Or  . metoCLOPramide (REGLAN) injection 5-10 mg  5-10 mg Intravenous Q8H PRN Newt Minion, MD      . nitroGLYCERIN (NITROSTAT) SL tablet 0.4 mg  0.4 mg Sublingual Q5 Min x 3 PRN Newt Minion, MD      . ondansetron [facility]) tablet 4 mg  4 mg Oral Q6H PRN Newt Minion, MD       Or  . ondansetron [facility]) injection 4 mg  4 mg Intravenous Q6H PRN Newt Minion, MD      . oxyCODONE (Oxy IR/ROXICODONE) immediate release tablet 10 mg  10 mg Oral Q3H PRN Newt Minion, MD   10 mg at 05/08/17 0343  . oxyCODONE (Oxy IR/ROXICODONE) immediate release tablet 5 mg  5 mg Oral Q3H PRN Newt Minion, MD   5 mg at 05/08/17 1312  . polyethylene glycol (MIRALAX / GLYCOLAX) packet 17 g  17 g Oral Daily PRN Newt Minion, MD      . sertraline (ZOLOFT) tablet 50 mg  50 mg Oral Daily Newt Minion, MD   50 mg at 05/08/17 [phone]  . traZODone (DESYREL) tablet 150 mg  150 mg Oral QHS PRN Newt Minion, MD         Discharge Medications: Please see discharge summary for a list of discharge medications.  Relevant Imaging Results:  Relevant Lab Results:   Additional Information SS#: Birney Barlow Harrison, LCSW

## 2017-05-08 NOTE — Plan of Care (Signed)
  Nutrition: Adequate nutrition will be maintained 05/08/2017 1104 - Progressing by Williams Che, RN   Elimination: Will not experience complications related to bowel motility 05/08/2017 1104 - Progressing by Williams Che, RN   Pain Managment: General experience of comfort will improve 05/08/2017 1104 - Progressing by Williams Che, RN   Safety: Ability to remain free from injury will improve 05/08/2017 1104 - Progressing by Williams Che, RN   Skin Integrity: Risk for impaired skin integrity will decrease 05/08/2017 1104 - Progressing by Williams Che, RN

## 2017-05-08 NOTE — Progress Notes (Signed)
PROGRESS NOTE  Julia West IRS:[id number] DOB: 09-13-1955 DOA: 05/06/2017 PCP: [name], MD  HPI/Recap of past 24 hours: Julia West is an 62 y.o. female hypertension, hyperlipidemia, depression, obesity, lymphedema, CAD, myocardial infarction, who was admitted by orthopedic surgeon for a left BKA on 05/06/17. Hospitalists were consulted due to episodes of bradycardia, likely due to narcotics.  Today, Julia West reported no new complaints asides post op pain, controlled by pain meds. Julia West denies any chest pain, SOB, abdominal pain, N/V, fever/chills. Julia West still c/o dysuria, UC pending.   Assessment/Plan: Principal Problem:   S/P BKA (below knee amputation) unilateral, left (HCC) Active Problems:   Hyperlipemia   Essential hypertension   CAD (coronary artery disease)   Gangrene of left foot (HCC)   Wound dehiscence, surgical, sequela   Bradycardia   Depression   Lymphedema  Bradycardia Improving, HR in the 50s-60, asymptomatic Likely multifactorial: dilaudid, morbid obesity, BB  Trop X 3 neg, EKG no acute ST changes Cardiology consulted, likely multifactorial as mentioned above, discontinue BB on discharge Telemetry D/C BB on discharge  S/P BKA (below knee amputation) unilateral, left Management by orthopedics team  HLD -fenofibrate  CAD (coronary artery disease) Chest pain free ASA and fenofibrate  Depression No suicidal or homicidal ideations. Continue home medications: Wellbutrin and Zoloft  HTN: Irbesartan and HCTZ  Lymphedema: Continue Lasix  Low TSH: Free T4 pending     Code Status: Full  Family Communication: Daughter at bedside   Disposition Plan: TBD, likely SNF   Consultants:  Orthopedics  Procedures:  L BKA on 05/06/17  Antimicrobials:  None  DVT prophylaxis:  Lovenox   Objective: Vitals:   05/07/17 0900 05/07/17 1500 05/07/17 1930 05/08/17 0514  BP: (!) 144/42 126/67 (!) 129/48 (!) 137/53  Pulse: (!) 54 (!) 53  60 60  Resp:  16 16 16   Temp:  98.2 F (36.8 C) 98.3 F (36.8 C) 98.9 F (37.2 C)  TempSrc:  Oral Oral Oral  SpO2:  97% 95% 93%  Weight:        Intake/Output Summary (Last 24 hours) at 05/08/2017 1126 Last data filed at 05/08/2017 1100 Gross per 24 hour  Intake 360 ml  Output 2701 ml  Net -2341 ml   Filed Weights   05/06/17 0805  Weight: 127 kg (280 lb)    Exam:   General: NAD  Cardiovascular: S1, S2 present  Respiratory: CTA  Abdomen: Obese, soft, NT, ND, BS+  Musculoskeletal: L BKA, Lymphedema on RLE  Skin: Normal  Psychiatry: Normal mood   Data Reviewed: CBC: Recent Labs  Lab 05/06/17 0828 05/08/17 0412  WBC 12.4* 11.0*  NEUTROABS  --  5.6  HGB 13.3 10.8*  HCT 41.6 34.7*  MCV 86.0 87.0  PLT 366 009   Basic Metabolic Panel: Recent Labs  Lab 05/06/17 0828  NA 136  K 3.7  CL 101  CO2 21*  GLUCOSE 94  BUN 26*  CREATININE 0.92  CALCIUM 9.7   GFR: Estimated Creatinine Clearance: 84.3 mL/min (by C-G formula based on SCr of 0.92 mg/dL). Liver Function Tests: No results for input(s): AST, ALT, ALKPHOS, BILITOT, PROT, ALBUMIN in the last 168 hours. No results for input(s): LIPASE, AMYLASE in the last 168 hours. No results for input(s): AMMONIA in the last 168 hours. Coagulation Profile: No results for input(s): INR, PROTIME in the last 168 hours. Cardiac Enzymes: Recent Labs  Lab 05/07/17 0504 05/07/17 1045 05/07/17 1741  TROPONINI <0.03 <0.03 <0.03   BNP (last 3  results) No results for input(s): PROBNP in the last 8760 hours. HbA1C: No results for input(s): HGBA1C in the last 72 hours. CBG: No results for input(s): GLUCAP in the last 168 hours. Lipid Profile: No results for input(s): CHOL, HDL, LDLCALC, TRIG, CHOLHDL, LDLDIRECT in the last 72 hours. Thyroid Function Tests: Recent Labs    05/07/17 0504  TSH 0.280*   Anemia Panel: No results for input(s): VITAMINB12, FOLATE, FERRITIN, TIBC, IRON, RETICCTPCT in the last 72  hours. Urine analysis:    Component Value Date/Time   COLORURINE STRAW (A) 05/07/2017 1751   APPEARANCEUR CLEAR 05/07/2017 1751   APPEARANCEUR Clear 12/10/2016 1203   LABSPEC 1.009 05/07/2017 1751   PHURINE 5.0 05/07/2017 1751   GLUCOSEU NEGATIVE 05/07/2017 1751   HGBUR NEGATIVE 05/07/2017 1751   BILIRUBINUR NEGATIVE 05/07/2017 1751   BILIRUBINUR Negative 12/10/2016 [place] 05/07/2017 1751   PROTEINUR NEGATIVE 05/07/2017 1751   UROBILINOGEN 0.2 05/10/2014 1039   NITRITE NEGATIVE 05/07/2017 1751   LEUKOCYTESUR NEGATIVE 05/07/2017 1751   LEUKOCYTESUR Trace (A) 12/10/2016 1203   Sepsis Labs: @LABRCNTIP (procalcitonin:4,lacticidven:4)  )No results found for this or any previous visit (from the past 240 hour(s)).    Studies: No results found.  Scheduled Meds: . aspirin EC  81 mg Oral Daily  . buPROPion  50 mg Oral Daily  . docusate sodium  100 mg Oral BID  . fenofibrate  160 mg Oral Daily  . furosemide  20 mg Oral Daily  . gabapentin  300 mg Oral TID  . irbesartan  150 mg Oral Daily   And  . hydrochlorothiazide  12.5 mg Oral Daily  . sertraline  50 mg Oral Daily    Continuous Infusions: . sodium chloride 10 mL/hr at 05/06/17 1330  . methocarbamol (ROBAXIN)  IV       LOS: 2 days     Alma Friendly, MD [facility]  If 7PM-7AM, please contact night-coverage [email or web address] Password [facility] 05/08/2017, 11:26 AM

## 2017-05-09 ENCOUNTER — Encounter (HOSPITAL_COMMUNITY): Payer: Self-pay | Admitting: *Deleted

## 2017-05-09 LAB — T4, FREE: Free T4: 1.16 ng/dL — ABNORMAL HIGH (ref 0.61–1.12)

## 2017-05-09 MED ORDER — KETOROLAC TROMETHAMINE 30 MG/ML IJ SOLN
30.0000 mg | Freq: Three times a day (TID) | INTRAMUSCULAR | Status: AC
  Administered 2017-05-09 – 2017-05-10 (×5): 30 mg via INTRAVENOUS
  Filled 2017-05-09 (×5): qty 1

## 2017-05-09 MED ORDER — FOSFOMYCIN TROMETHAMINE 3 G PO PACK
3.0000 g | PACK | Freq: Once | ORAL | Status: AC
  Administered 2017-05-09: 3 g via ORAL
  Filled 2017-05-09: qty 3

## 2017-05-09 MED ORDER — CYCLOBENZAPRINE HCL 10 MG PO TABS
5.0000 mg | ORAL_TABLET | Freq: Once | ORAL | Status: AC
  Administered 2017-05-09: 5 mg via ORAL
  Filled 2017-05-09: qty 1

## 2017-05-09 NOTE — Social Work (Addendum)
CSW f/u for SNF placement.  CSW contacted SNF's per daughter's interest: [facility], [facility], [facility].  CSW left message for admission staff at [facility] listed.  10:00am CSW contacted the daughter to discuss SNF offers. Daughter indicated that [facility] indicated that they may have a SNF bed. CSW sent referral to SNF again and will f/u with admission staff. Daughter indicated that she is going to SNF to take a tour of facility.  CSW discussed SNF offers with daughter as well.  CSW contacted admission Julia West, Julia West and she indicated that they will review clinicals/ insurance and let CSW know if they can offer a SNF bed.  CSW will continue to follow up.  4:03pm: CSW contacted daughter Julia West to discuss SNF offers again. CSW advised daughter that that [name] is not in network; [facility], [facility], [facility], [facility] have declined to offer SNF bed.  CSW provided daughter the list of SNF's that have offered a bed. CSW encouraged daughter to select a SNF expeditiously as the SNF will need to obtain Insurance Auth for placement. Daughter in agreement and will f/u.  CSW will continue to follow for disposition.  [name], LCSW Clinical Social Worker [phone]

## 2017-05-09 NOTE — Progress Notes (Signed)
Physical Therapy Treatment Patient Details Name: Julia West MRN: [id number] DOB: Apr 12, 1955 Today's Date: 05/09/2017    History of Present Illness Julia West is a 62 y/o female admitted on 05/06/17 for L transtibial amputation s/p wound dehiscence after reconstruction for Charcot collapse with worsening gangrenous change to L foot. Patient with a PMH significant for MI, anxiety, CAD, HTN, lymphedema.    PT Comments    Patient is making progress toward mobility goals and is eager to participate. Pt tolerated AP transfer EOB to recliner with mod A +2 and min A for bed mobility and getting into long sitting. NT educated on AP transfer for return to bed and NT and pt aware that hoyer lift may be required for safe return to bed from recliner. Continue to progress as tolerated.     Follow Up Recommendations  SNF     Equipment Recommendations  Other (comment)(will determine at next venue of care)    Recommendations for Other Services       Precautions / Restrictions Precautions Precautions: Fall Restrictions Weight Bearing Restrictions: Yes LLE Weight Bearing: Non weight bearing    Mobility  Bed Mobility Overal bed mobility: Needs Assistance Bed Mobility: Supine to Sit     Supine to sit: Min assist     General bed mobility comments: assist with bed pad to come into long sitting in preparation for AP transfer; cues for sequencing and use of rail  Transfers Overall transfer level: Needs assistance   Transfers: Anterior-Posterior Transfer       Anterior-Posterior transfers: Mod assist;+2 physical assistance   General transfer comment: cues for positioning, hand placement, anterior translation of trunk, and seqeuncing; use of bed pad to scoot hips   Ambulation/Gait                 Stairs            Wheelchair Mobility    Modified Rankin (Stroke Patients Only)       Balance Overall balance assessment: Needs assistance Sitting-balance support:  Bilateral upper extremity supported;Feet unsupported Sitting balance-Leahy Scale: Fair                                      Cognition Arousal/Alertness: Awake/alert Behavior During Therapy: WFL for tasks assessed/performed Overall Cognitive Status: Within Functional Limits for tasks assessed                                        Exercises      General Comments        Pertinent Vitals/Pain Pain Assessment: Faces Faces Pain Scale: Hurts even more Pain Location: L LE with mobility Pain Descriptors / Indicators: Aching;Sore Pain Intervention(s): Limited activity within patient's tolerance;Monitored during session;Premedicated before session;Repositioned    Home Living                      Prior Function            PT Goals (current goals can now be found in the care plan section) Acute Rehab PT Goals PT Goal Formulation: With patient Time For Goal Achievement: 05/21/17 Potential to Achieve Goals: Good Progress towards PT goals: Progressing toward goals    Frequency    Min 2X/week      PT Plan Current plan remains appropriate  Co-evaluation              AM-PAC PT "6 Clicks" Daily Activity  Outcome Measure  Difficulty turning over in bed (including adjusting bedclothes, sheets and blankets)?: Unable Difficulty moving from lying on back to sitting on the side of the bed? : Unable Difficulty sitting down on and standing up from a chair with arms (e.g., wheelchair, bedside commode, etc,.)?: Unable Help needed moving to and from a bed to chair (including a wheelchair)?: A Lot Help needed walking in hospital room?: Total Help needed climbing 3-5 steps with a railing? : Total 6 Click Score: 7    End of Session   Activity Tolerance: Patient tolerated treatment well Patient left: with call bell/phone within reach;in chair Nurse Communication: Mobility status PT Visit Diagnosis: Unsteadiness on feet (R26.81);Other  abnormalities of gait and mobility (R26.89);Muscle weakness (generalized) (M62.81);Difficulty in walking, not elsewhere classified (R26.2)     Time: 4481-8563 PT Time Calculation (min) (ACUTE ONLY): 30 min  Charges:  $Therapeutic Activity: 23-37 mins                    G Codes:       Earney Navy, PTA Pager: [phone]     Darliss Cheney 05/09/2017, 4:39 PM

## 2017-05-09 NOTE — Progress Notes (Signed)
PROGRESS NOTE  [name] HCW:[id number] DOB: [birth year] DOA: 05/06/2017 PCP: [name], MD  HPI/Recap of past 24 hours: Julia West is an 62 y.o. female hypertension, hyperlipidemia, depression, obesity, lymphedema, CAD, myocardial infarction, who was admitted by orthopedic surgeon for a left BKA on 05/06/17. Hospitalists were consulted due to episodes of bradycardia, likely due to narcotics.  Today, pt still reported dysuria, denies any chest pain, SOB, abdominal pain, N/V, fever/chills   Assessment/Plan: Principal Problem:   S/P BKA (below knee amputation) unilateral, left (HCC) Active Problems:   Hyperlipemia   Essential hypertension   CAD (coronary artery disease)   Gangrene of left foot (HCC)   Wound dehiscence, surgical, sequela   Bradycardia   Depression   Lymphedema  Bradycardia Resolved, HR in the 60s, asymptomatic Likely multifactorial: dilaudid, morbid obesity, BB  Trop X 3 neg, EKG no acute ST changes Cardiology consulted, likely multifactorial as mentioned above, discontinue BB on discharge Telemetry D/C BB on discharge  S/P BKA (below knee amputation) unilateral, left Management by orthopedics team  UTI C/O persistent dysuria U/A neg, UC no growth Due to persistent sym, will treat with 1 dose of fosfomycin  3G X 1dose  HLD -fenofibrate  CAD (coronary artery disease) Chest pain free ASA and fenofibrate  Depression No suicidal or homicidal ideations. Continue home medications: Wellbutrin and Zoloft  HTN: Irbesartan and HCTZ  Lymphedema: Continue Lasix  Low TSH: Free T4 minimal elevated at 1.16 PCP to repeat TSH and free T4     Code Status: Full  Family Communication: Daughter at bedside   Disposition Plan: TBD, likely SNF   Consultants:  Orthopedics  Procedures:  L BKA on 05/06/17  Antimicrobials:  PO fosfomycin  DVT prophylaxis:  Lovenox   Objective: Vitals:   05/08/17 1300 05/08/17 1959  05/09/17 0544 05/09/17 1300  BP: (!) 167/72 (!) 135/47 136/74 126/67  Pulse: 94 64 60 (!) 56  Resp: 16 18 18 18   Temp: 98.8 F (37.1 C) 99.6 F (37.6 C) 98 F (36.7 C) 98.1 F (36.7 C)  TempSrc: Oral Oral Oral Oral  SpO2: 99%  94% 92%  Weight:        Intake/Output Summary (Last 24 hours) at 05/09/2017 1630 Last data filed at 05/09/2017 1508 Gross per 24 hour  Intake 1456.33 ml  Output -  Net 1456.33 ml   Filed Weights   05/06/17 0805  Weight: 127 kg (280 lb)    Exam:   General: NAD  Cardiovascular: S1, S2 present  Respiratory: CTA  Abdomen: Obese, soft, NT, ND, BS+  Musculoskeletal: L BKA, Lymphedema on RLE  Skin: Normal  Psychiatry: Normal mood   Data Reviewed: CBC: Recent Labs  Lab 05/06/17 0828 05/08/17 0412  WBC 12.4* 11.0*  NEUTROABS  --  5.6  HGB 13.3 10.8*  HCT 41.6 34.7*  MCV 86.0 87.0  PLT 366 176   Basic Metabolic Panel: Recent Labs  Lab 05/06/17 0828  NA 136  K 3.7  CL 101  CO2 21*  GLUCOSE 94  BUN 26*  CREATININE 0.92  CALCIUM 9.7   GFR: Estimated Creatinine Clearance: 84.3 mL/min (by C-G formula based on SCr of 0.92 mg/dL). Liver Function Tests: No results for input(s): AST, ALT, ALKPHOS, BILITOT, PROT, ALBUMIN in the last 168 hours. No results for input(s): LIPASE, AMYLASE in the last 168 hours. No results for input(s): AMMONIA in the last 168 hours. Coagulation Profile: No results for input(s): INR, PROTIME in the last 168 hours. Cardiac Enzymes:  Recent Labs  Lab 05/07/17 0504 05/07/17 1045 05/07/17 1741  TROPONINI <0.03 <0.03 <0.03   BNP (last 3 results) No results for input(s): PROBNP in the last 8760 hours. HbA1C: No results for input(s): HGBA1C in the last 72 hours. CBG: No results for input(s): GLUCAP in the last 168 hours. Lipid Profile: No results for input(s): CHOL, HDL, LDLCALC, TRIG, CHOLHDL, LDLDIRECT in the last 72 hours. Thyroid Function Tests: Recent Labs    05/07/17 0504 05/09/17 0457  TSH  0.280*  --   FREET4  --  1.16*   Anemia Panel: No results for input(s): VITAMINB12, FOLATE, FERRITIN, TIBC, IRON, RETICCTPCT in the last 72 hours. Urine analysis:    Component Value Date/Time   COLORURINE STRAW (A) 05/07/2017 1751   APPEARANCEUR CLEAR 05/07/2017 1751   APPEARANCEUR Clear 12/10/2016 1203   LABSPEC 1.009 05/07/2017 1751   PHURINE 5.0 05/07/2017 1751   GLUCOSEU NEGATIVE 05/07/2017 1751   HGBUR NEGATIVE 05/07/2017 1751   BILIRUBINUR NEGATIVE 05/07/2017 1751   BILIRUBINUR Negative 12/10/2016 [place] 05/07/2017 1751   PROTEINUR NEGATIVE 05/07/2017 1751   UROBILINOGEN 0.2 05/10/2014 1039   NITRITE NEGATIVE 05/07/2017 1751   LEUKOCYTESUR NEGATIVE 05/07/2017 1751   LEUKOCYTESUR Trace (A) 12/10/2016 1203   Sepsis Labs: @LABRCNTIP (procalcitonin:4,lacticidven:4)  ) Recent Results (from the past 240 hour(s))  Urine Culture     Status: None   Collection Time: 05/07/17  2:50 PM  Result Value Ref Range Status   Specimen Description URINE, CLEAN CATCH  Final   Special Requests NONE  Final   Culture   Final    NO GROWTH Performed at [facility], [place] [address] [place], [place] [address]    Report Status 05/08/2017 FINAL  Final      Studies: No results found.  Scheduled Meds: . aspirin EC  81 mg Oral Daily  . buPROPion  50 mg Oral Daily  . docusate sodium  100 mg Oral BID  . enoxaparin (LOVENOX) injection  60 mg Subcutaneous Q24H  . fenofibrate  160 mg Oral Daily  . furosemide  20 mg Oral Daily  . gabapentin  300 mg Oral TID  . irbesartan  150 mg Oral Daily   And  . hydrochlorothiazide  12.5 mg Oral Daily  . ketorolac  30 mg Intravenous Q8H  . sertraline  50 mg Oral Daily    Continuous Infusions: . sodium chloride 10 mL/hr at 05/06/17 1330  . methocarbamol (ROBAXIN)  IV       LOS: 3 days     Alma Friendly, MD [facility]  If 7PM-7AM, please contact night-coverage [email or web address] Password [facility] 05/09/2017, 4:30  PM

## 2017-05-09 NOTE — Progress Notes (Signed)
Patient ID: Julia West, female   DOB: Nov 22, 1955, 62 y.o.   MRN: [id number] Status post transtibial amputation.  There is minimal drainage in the wound VAC canister.  Patient is having difficulty with mobilization and pain control.  Patient became oversedated with IV narcotic pain medicine.  We will plan for discharge to skilled nursing.  Creatinine in normal range we will add Toradol to her pain medication.

## 2017-05-10 ENCOUNTER — Encounter (HOSPITAL_COMMUNITY): Payer: Self-pay | Admitting: General Practice

## 2017-05-10 ENCOUNTER — Other Ambulatory Visit: Payer: Self-pay

## 2017-05-10 ENCOUNTER — Telehealth: Payer: Self-pay | Admitting: Physician Assistant

## 2017-05-10 LAB — HEMOGLOBIN A1C
Hgb A1c MFr Bld: 5.4 % (ref 4.8–5.6)
MEAN PLASMA GLUCOSE: 108 mg/dL

## 2017-05-10 NOTE — Telephone Encounter (Signed)
Called and spoke with pt in the hospital to let her know Dr Warren Lacy is not in the hospital this week, pt stated that her BP has been running low (diastolic) in the 75O and 83G. Advised pt to speak with the Dr and/or nurse on call about her BP, pt voice understanding

## 2017-05-10 NOTE — Plan of Care (Signed)
  Nutrition: Adequate nutrition will be maintained 05/10/2017 1035 - Progressing by Williams Che, RN   Elimination: Will not experience complications related to bowel motility 05/10/2017 1035 - Progressing by Williams Che, RN   Pain Managment: General experience of comfort will improve 05/10/2017 1035 - Progressing by Williams Che, RN   Safety: Ability to remain free from injury will improve 05/10/2017 1035 - Progressing by Williams Che, RN   Skin Integrity: Risk for impaired skin integrity will decrease 05/10/2017 1035 - Progressing by Williams Che, RN

## 2017-05-10 NOTE — Progress Notes (Signed)
PROGRESS NOTE  Julia West HDQ:[id number] DOB: 09-Jul-1955 DOA: 05/06/2017 PCP: [name], MD  HPI/Recap of past 24 hours: Julia West is an 62 y.o. female hypertension, hyperlipidemia, depression, obesity, lymphedema, CAD, myocardial infarction, who was admitted by orthopedic surgeon for a left BKA on 05/06/17. Hospitalists were consulted due to episodes of bradycardia, likely due to narcotics.  Today, pt reported feeling better, still unable to participate fully in PT. Denies any chest pain, SOB, abdominal pain, N/V, fever/chills.   Assessment/Plan: Principal Problem:   S/P BKA (below knee amputation) unilateral, left (HCC) Active Problems:   Hyperlipemia   Essential hypertension   CAD (coronary artery disease)   Gangrene of left foot (HCC)   Wound dehiscence, surgical, sequela   Bradycardia   Depression   Lymphedema  Bradycardia Resolved, HR in the 60s, asymptomatic Likely multifactorial: dilaudid, morbid obesity, BB  Trop X 3 neg, EKG no acute ST changes Cardiology consulted, likely multifactorial as mentioned above, discontinue BB on discharge Telemetry D/C BB on discharge  S/P BKA (below knee amputation) unilateral, left Management by orthopedics team  UTI Resolved C/O persistent dysuria U/A neg, UC no growth Due to persistent sym, treated with 1 dose of fosfomycin 3G on 05/09/17  HLD -fenofibrate  CAD (coronary artery disease) Chest pain free ASA and fenofibrate  Depression No suicidal or homicidal ideations. Continue home medications: Wellbutrin and Zoloft  HTN: Irbesartan and HCTZ  Lymphedema: Continue Lasix  Low TSH: Free T4 minimal elevated at 1.16 PCP to repeat TSH and free T4   HOSPITALIST WILL SIGN OFF ON PATIENT, THANK YOU FOR THE CONSULT    Code Status: Full  Family Communication: None at bedside   Disposition Plan: Likely SNF on 05/11/17   Consultants:  Orthopedics  Procedures:  L BKA on  05/06/17  Antimicrobials:  PO fosfomycin  DVT prophylaxis:  Lovenox   Objective: Vitals:   05/10/17 0547 05/10/17 0805 05/10/17 1350 05/10/17 1432  BP: (!) 133/45 (!) 108/36 129/69 (!) 128/41  Pulse: (!) 54 (!) 55 (!) 57 (!) 57  Resp: 18 16 17 18   Temp: 98.6 F (37 C) 98.3 F (36.8 C) 98.8 F (37.1 C) 99.9 F (37.7 C)  TempSrc: Oral Oral Oral Oral  SpO2: 94% 94% 95% 96%  Weight:        Intake/Output Summary (Last 24 hours) at 05/10/2017 1559 Last data filed at 05/10/2017 1000 Gross per 24 hour  Intake 960 ml  Output 900 ml  Net 60 ml   Filed Weights   05/06/17 0805 05/09/17 2132  Weight: 127 kg (280 lb) 119.5 kg (263 lb 7.2 oz)    Exam:   General: NAD  Cardiovascular: S1, S2 present  Respiratory: CTA  Abdomen: Obese, soft, NT, ND, BS+  Musculoskeletal: L BKA with wound vac, Lymphedema on RLE  Skin: Normal  Psychiatry: Normal mood   Data Reviewed: CBC: Recent Labs  Lab 05/06/17 0828 05/08/17 0412  WBC 12.4* 11.0*  NEUTROABS  --  5.6  HGB 13.3 10.8*  HCT 41.6 34.7*  MCV 86.0 87.0  PLT 366 119   Basic Metabolic Panel: Recent Labs  Lab 05/06/17 0828  NA 136  K 3.7  CL 101  CO2 21*  GLUCOSE 94  BUN 26*  CREATININE 0.92  CALCIUM 9.7   GFR: Estimated Creatinine Clearance: 81.3 mL/min (by C-G formula based on SCr of 0.92 mg/dL). Liver Function Tests: No results for input(s): AST, ALT, ALKPHOS, BILITOT, PROT, ALBUMIN in the last 168 hours. No  results for input(s): LIPASE, AMYLASE in the last 168 hours. No results for input(s): AMMONIA in the last 168 hours. Coagulation Profile: No results for input(s): INR, PROTIME in the last 168 hours. Cardiac Enzymes: Recent Labs  Lab 05/07/17 0504 05/07/17 1045 05/07/17 1741  TROPONINI <0.03 <0.03 <0.03   BNP (last 3 results) No results for input(s): PROBNP in the last 8760 hours. HbA1C: Recent Labs    05/09/17 0457  HGBA1C 5.4   CBG: No results for input(s): GLUCAP in the last 168  hours. Lipid Profile: No results for input(s): CHOL, HDL, LDLCALC, TRIG, CHOLHDL, LDLDIRECT in the last 72 hours. Thyroid Function Tests: Recent Labs    05/09/17 0457  FREET4 1.16*   Anemia Panel: No results for input(s): VITAMINB12, FOLATE, FERRITIN, TIBC, IRON, RETICCTPCT in the last 72 hours. Urine analysis:    Component Value Date/Time   COLORURINE STRAW (A) 05/07/2017 1751   APPEARANCEUR CLEAR 05/07/2017 1751   APPEARANCEUR Clear 12/10/2016 1203   LABSPEC 1.009 05/07/2017 1751   PHURINE 5.0 05/07/2017 1751   GLUCOSEU NEGATIVE 05/07/2017 1751   HGBUR NEGATIVE 05/07/2017 1751   BILIRUBINUR NEGATIVE 05/07/2017 1751   BILIRUBINUR Negative 12/10/2016 [place] 05/07/2017 1751   PROTEINUR NEGATIVE 05/07/2017 1751   UROBILINOGEN 0.2 05/10/2014 1039   NITRITE NEGATIVE 05/07/2017 1751   LEUKOCYTESUR NEGATIVE 05/07/2017 1751   LEUKOCYTESUR Trace (A) 12/10/2016 1203   Sepsis Labs: @LABRCNTIP (procalcitonin:4,lacticidven:4)  ) Recent Results (from the past 240 hour(s))  Urine Culture     Status: None   Collection Time: 05/07/17  2:50 PM  Result Value Ref Range Status   Specimen Description URINE, CLEAN CATCH  Final   Special Requests NONE  Final   Culture   Final    NO GROWTH Performed at [facility], [place] [address], [place], [place] [address]    Report Status 05/08/2017 FINAL  Final      Studies: No results found.  Scheduled Meds: . aspirin EC  81 mg Oral Daily  . buPROPion  50 mg Oral Daily  . docusate sodium  100 mg Oral BID  . enoxaparin (LOVENOX) injection  60 mg Subcutaneous Q24H  . fenofibrate  160 mg Oral Daily  . furosemide  20 mg Oral Daily  . gabapentin  300 mg Oral TID  . irbesartan  150 mg Oral Daily   And  . hydrochlorothiazide  12.5 mg Oral Daily  . sertraline  50 mg Oral Daily    Continuous Infusions: . sodium chloride 10 mL/hr at 05/06/17 1330  . methocarbamol (ROBAXIN)  IV       LOS: 4 days     Alma Friendly, MD [facility]  If 7PM-7AM, please contact night-coverage [email or web address] Password [facility] 05/10/2017, 3:59 PM

## 2017-05-10 NOTE — Social Work (Addendum)
CSW contacted daughter to discuss SNF placement. Daughter not pleased with the assistance CSW is providing as daughter wants patient to get long term care temporarily to ensure that she is covered through benefits. CSW advised daughter that patient is being recommending for short term/acurte rehab at a nursing facility.  Daughter has applied for medicaid, which would cover a long term bed in SNF if needed. CSW  Validated her feelings and advised that she can go to nursing facility while daughter can work on getting a long term bed for placement once the medicaid is active or patient/family would have to private pay for long term care. Daughter indicated that she is visiting facilities now. Daughter indicated that she wants the patient advocate from [facility] that is assisting her to speak with CSW. CSW provided daughter with contact information to share with [facility] advocate.  CSW then was provided contact information for [facility] patient [id number]([phone]) from floor RN. CSW contacted Devin at [facility] to discuss. CSW provided Devin with a list of SNF's that have offered a SNF bed. CSW explained her role in the disposition process. Devin indicated that she will check insurances for the SNF's that offered a SNF bed and provide to the daughter.  CSW will continue to follow.  [name], LCSW Clinical Social Worker [phone]

## 2017-05-10 NOTE — Social Work (Addendum)
CSW discussed SNF placement with patient. She expressed interest in [facility]. CSW sent referral to [facility] and will f/u.  CSW discussed the need to select a SNF today as the SNF will need to obtain Insurance Auth for placement. Pt voiced understanding and still had concerns about the SNF process. CSW validated concerns and explained what to expect with the SNF process again.  Pt desires to return home and is struggling with having to go to a facility again after being in a facility and dealing with this impairment since November. CSW provided supportive listening.  CSW then contacted admission staff at [facility] and left message requesting a call back to discuss. CSW also contacted, admisssion staff at [facility] in [place] as well. Dawn, admission staff, will review and get back to CSW.  CSW will continue to follow up for SNF placement.  9:43am: CSW met with patient at bedside and discussed SNF placement again. Pt struggling to settle on a SNF. CSW validated and discussed SNF offers again. Pt stated she will call daughter and discuss.  CSW reiterated that SNF will need time to obtain Insurance Auth for placement. CSW also advised patient that medicare covers a semi-private room and there could be a fee at [facility] for a private room.  CSW will f/u with daughter an patient again.  [name], LCSW Clinical Social Worker [phone]

## 2017-05-10 NOTE — Progress Notes (Signed)
Patient ID: Julia West, female   DOB: [birth year], 62 y.o.   MRN: [id number] Patient still with extremely slow progress with therapy with physical therapy.  She states she is able to use a sliding board to get from a bed to chair.  She has not performed any gait training yet.  Patient and family are trying to find their ideal skilled nursing facility.  Patient is not a candidate for inpatient rehab.  Anticipate discharge to skilled nursing tomorrow.  Patient is alert oriented no complaints this morning.

## 2017-05-10 NOTE — Telephone Encounter (Signed)
New message  Pt verbalized that she is calling for the [facility] is trying to discharge pt but pt want Dr.Hochrein to come and see her in the hospital   She states that her vitals are unstable, is being dc and sent to a rehab pt said   she dont want to leave until a provider from our office comes to see her

## 2017-05-11 DIAGNOSIS — S88019A Complete traumatic amputation at knee level, unspecified lower leg, initial encounter: Secondary | ICD-10-CM | POA: Diagnosis not present

## 2017-05-11 DIAGNOSIS — M6281 Muscle weakness (generalized): Secondary | ICD-10-CM | POA: Diagnosis not present

## 2017-05-11 DIAGNOSIS — T8130XD Disruption of wound, unspecified, subsequent encounter: Secondary | ICD-10-CM | POA: Diagnosis not present

## 2017-05-11 DIAGNOSIS — M14672 Charcot's joint, left ankle and foot: Secondary | ICD-10-CM | POA: Diagnosis not present

## 2017-05-11 DIAGNOSIS — I890 Lymphedema, not elsewhere classified: Secondary | ICD-10-CM | POA: Diagnosis not present

## 2017-05-11 DIAGNOSIS — I2510 Atherosclerotic heart disease of native coronary artery without angina pectoris: Secondary | ICD-10-CM | POA: Diagnosis not present

## 2017-05-11 DIAGNOSIS — R001 Bradycardia, unspecified: Secondary | ICD-10-CM | POA: Diagnosis not present

## 2017-05-11 DIAGNOSIS — I10 Essential (primary) hypertension: Secondary | ICD-10-CM | POA: Diagnosis not present

## 2017-05-11 DIAGNOSIS — R278 Other lack of coordination: Secondary | ICD-10-CM | POA: Diagnosis not present

## 2017-05-11 DIAGNOSIS — G8911 Acute pain due to trauma: Secondary | ICD-10-CM | POA: Diagnosis not present

## 2017-05-11 LAB — CREATININE, SERUM
CREATININE: 0.87 mg/dL (ref 0.44–1.00)
GFR calc Af Amer: >60 mL/min (ref >60)

## 2017-05-11 MED ORDER — HYDROCODONE-ACETAMINOPHEN 5-325 MG PO TABS: 1.0000 | ORAL_TABLET | Freq: Four times a day (QID) | ORAL | 0 refills | Status: DC | PRN

## 2017-05-11 NOTE — Progress Notes (Signed)
Clinical Social Worker facilitated patient discharge including contacting patient family and facility to confirm patient discharge plans.  Clinical information faxed to facility and family agreeable with plan.  CSW arranged ambulance transport via [facility] to [facility]  .  RN to call [phone] will be placed in room 3221)  for report prior to discharge.  Clinical Social Worker will sign off for now as social work intervention is no longer needed. Please consult [place] again if new need arises.  Rhea Pink, MSW, [place]

## 2017-05-11 NOTE — Progress Notes (Signed)
[name] to be D/C'd Skilled nursing facility per MD order.  Discussed prescriptions and follow up appointments with the patient. Prescriptions given to patient, medication list explained in detail. Pt verbalized understanding.  Allergies as of 05/11/2017      Reactions   Ace Inhibitors Cough      Medication List    STOP taking these medications   metoprolol tartrate 25 MG tablet Commonly known as:  LOPRESSOR   mupirocin ointment 2 % Commonly known as:  BACTROBAN   silver sulfADIAZINE 1 % cream Commonly known as:  SILVADENE   traMADol 50 MG tablet Commonly known as:  ULTRAM   traZODone 150 MG tablet Commonly known as:  DESYREL     TAKE these medications   aspirin 81 MG tablet Take 81 mg by mouth daily.   buPROPion 100 MG tablet Commonly known as:  WELLBUTRIN Take 50 mg by mouth daily.   docusate sodium 50 MG capsule Commonly known as:  COLACE Take 50 mg by mouth daily as needed for mild constipation.   fenofibrate 160 MG tablet TAKE 1 TABLET BY MOUTH  DAILY FOR CHOLESTEROL AND  TRIGLYCERIDES What changed:    how much to take  how to take this  when to take this  additional instructions   furosemide 20 MG tablet Commonly known as:  LASIX TAKE 1 TABLET BY MOUTH  DAILY What changed:    how much to take  how to take this  when to take this   HYDROcodone-acetaminophen 5-325 MG tablet Commonly known as:  NORCO/VICODIN Take 1-2 tablets by mouth every 6 (six) hours as needed for moderate pain or severe pain ((score 4 to 6)). What changed:    how much to take  when to take this   nitroGLYCERIN 0.4 MG SL tablet Commonly known as:  NITROSTAT Place 1 tablet (0.4 mg total) under the tongue every 5 (five) minutes x 3 doses as needed for chest pain. If pain persist after call 911   sertraline 50 MG tablet Commonly known as:  ZOLOFT Take 50 mg by mouth daily.   valsartan-hydrochlorothiazide 160-12.5 MG tablet Commonly known as:   DIOVAN-HCT Take 1 tablet by mouth daily.       Vitals:   05/10/17 2127 05/11/17 0500  BP: (!) 126/54 (!) 138/45  Pulse: (!) 54 61  Resp: 18 16  Temp: 99.2 F (37.3 C) 99.3 F (37.4 C)  SpO2: 96% 93%    Skin clean, dry and intact without evidence of skin break down, no evidence of skin tears noted. Dressing clean, dry and intact covered with 4x4 gauze and compression wrap. IV catheter discontinued intact. Site without signs and symptoms of complications. Dressing and pressure applied. Pt denies pain at this time. No complaints noted.  An After Visit Summary and prescriptions were printed and given to the [place]. Patient escorted via stretcher, and D/C to SNF via [facility].  [place] RN

## 2017-05-11 NOTE — Clinical Social Work Placement (Signed)
   CLINICAL SOCIAL WORK PLACEMENT  NOTE  Date:  05/11/2017  Patient Details  Name: Julia West MRN: [id number] Date of Birth: 1955/06/27  Clinical Social Work is seeking post-discharge placement for this patient at the [place] level of care (*CSW will initial, date and re-position this form in  chart as items are completed):  Yes   Patient/family provided with [facility] Work Department's list of facilities offering this level of care within the geographic area requested by the patient (or if unable, by the patient's family).  Yes   Patient/family informed of their freedom to choose among providers that offer the needed level of care, that participate in Medicare, Medicaid or managed care program needed by the patient, have an available bed and are willing to accept the patient.  Yes   Patient/family informed of [facility]'s ownership interest in [facility] and [facility], as well as of the fact that they are under no obligation to receive care at these facilities.  PASRR submitted to EDS on       PASRR number received on       Existing PASRR number confirmed on 05/11/17     FL2 transmitted to all facilities in geographic area requested by pt/family on 05/11/17     FL2 transmitted to all facilities within larger geographic area on       Patient informed that his/her managed care company has contracts with or will negotiate with certain facilities, including the following:        Yes   Patient/family informed of bed offers received.  Patient chooses bed at [facility]     Physician recommends and patient chooses bed at      Patient to be transferred to [facility] on 05/11/17.  Patient to be transferred to facility by [facility]     Patient family notified on 05/11/17 of transfer.  Name of family member notified:  pt daughter is aware/ spoke with dianne via phone      PHYSICIAN       Additional  Comment:    _______________________________________________ Wende Neighbors, LCSW 05/11/2017, 2:26 PM

## 2017-05-11 NOTE — Discharge Summary (Signed)
Discharge Diagnoses:  Principal Problem:   S/P BKA (below knee amputation) unilateral, left (HCC) Active Problems:   Hyperlipemia   Essential hypertension   CAD (coronary artery disease)   Gangrene of left foot (HCC)   Wound dehiscence, surgical, sequela   Bradycardia   Depression   Lymphedema   Surgeries: Procedure(s): BELOW KNEE AMPUTATION on 05/06/2017    Consultants:   Discharged Condition: Improved  Hospital Course: Julia West is an 62 y.o. female who was admitted 05/06/2017 with a chief complaint of wound dehiscence with Charcot collapse left foot, with a final diagnosis of Wound Dehiscence Left Charcot Foot.  Patient was brought to the operating room on 05/06/2017 and underwent Procedure(s): [place].    Patient was given perioperative antibiotics:  Anti-infectives (From admission, onward)   Start     Dose/Rate Route Frequency Ordered Stop   05/06/17 1600  ceFAZolin (ANCEF) IVPB 2g/100 mL premix     2 g 200 mL/hr over 30 Minutes Intravenous Every 6 hours 05/06/17 1237 05/07/17 0440   05/06/17 0945  ceFAZolin (ANCEF) 3 g in dextrose 5 % 50 mL IVPB  Status:  Discontinued     3 g 130 mL/hr over 30 Minutes Intravenous To Surgery 05/05/17 1120 05/06/17 1230    .  Patient was given sequential compression devices, early ambulation, and aspirin for DVT prophylaxis.  Recent vital signs:  Patient Vitals for the past 24 hrs:  BP Temp Temp src Pulse Resp SpO2  05/11/17 0500 (!) 138/45 99.3 F (37.4 C) Oral 61 16 93 %  05/10/17 2127 (!) 126/54 99.2 F (37.3 C) Oral (!) 54 18 96 %  05/10/17 1432 (!) 128/41 99.9 F (37.7 C) Oral (!) 57 18 96 %  05/10/17 1350 129/69 98.8 F (37.1 C) Oral (!) 57 17 95 %  05/10/17 0805 (!) 108/36 98.3 F (36.8 C) Oral (!) 55 16 94 %  .  Recent laboratory studies: No results found.  Discharge Medications:   Allergies as of 05/11/2017      Reactions   Ace Inhibitors Cough      Medication List    STOP taking these  medications   metoprolol tartrate 25 MG tablet Commonly known as:  LOPRESSOR   mupirocin ointment 2 % Commonly known as:  BACTROBAN   silver sulfADIAZINE 1 % cream Commonly known as:  SILVADENE   traMADol 50 MG tablet Commonly known as:  ULTRAM   traZODone 150 MG tablet Commonly known as:  DESYREL     TAKE these medications   aspirin 81 MG tablet Take 81 mg by mouth daily.   buPROPion 100 MG tablet Commonly known as:  WELLBUTRIN Take 50 mg by mouth daily.   docusate sodium 50 MG capsule Commonly known as:  COLACE Take 50 mg by mouth daily as needed for mild constipation.   fenofibrate 160 MG tablet TAKE 1 TABLET BY MOUTH  DAILY FOR CHOLESTEROL AND  TRIGLYCERIDES What changed:    how much to take  how to take this  when to take this  additional instructions   furosemide 20 MG tablet Commonly known as:  LASIX TAKE 1 TABLET BY MOUTH  DAILY What changed:    how much to take  how to take this  when to take this   HYDROcodone-acetaminophen 5-325 MG tablet Commonly known as:  NORCO/VICODIN Take 1-2 tablets by mouth every 6 (six) hours as needed for moderate pain or severe pain ((score 4 to 6)). What changed:  how much to take  when to take this   nitroGLYCERIN 0.4 MG SL tablet Commonly known as:  NITROSTAT Place 1 tablet (0.4 mg total) under the tongue every 5 (five) minutes x 3 doses as needed for chest pain. If pain persist after call 911   sertraline 50 MG tablet Commonly known as:  ZOLOFT Take 50 mg by mouth daily.   valsartan-hydrochlorothiazide 160-12.5 MG tablet Commonly known as:  DIOVAN-HCT Take 1 tablet by mouth daily.       Diagnostic Studies: Xr Foot Complete Left  Result Date: 05/02/2017 3 view radiographs of the left foot shows restoration of the alignment of the medial column no hardware failure no loss of reduction no rocker-bottom deformity.   Patient benefited maximally from their hospital stay and there were no  complications.     Disposition: 01-Home or Self Care Discharge Instructions    Call MD / Call 911   Complete by:  As directed    If you experience chest pain or shortness of breath, CALL 911 and be transported to the hospital emergency room.  If you develope a fever above 101 F, pus (white drainage) or increased drainage or redness at the wound, or calf pain, call your surgeon's office.   Constipation Prevention   Complete by:  As directed    Drink plenty of fluids.  Prune juice may be helpful.  You may use a stool softener, such as Colace (over the counter) 100 mg twice a day.  Use MiraLax (over the counter) for constipation as needed.   Diet - low sodium heart healthy   Complete by:  As directed    Increase activity slowly as tolerated   Complete by:  As directed      Follow-up Information    Newt Minion, MD Follow up in 2 week(s).   Specialty:  Orthopedic Surgery Contact information: [address] [place] [address] [phone]            Signed: Newt Minion 05/11/2017, 6:51 AM

## 2017-05-12 DIAGNOSIS — I10 Essential (primary) hypertension: Secondary | ICD-10-CM | POA: Diagnosis not present

## 2017-05-12 DIAGNOSIS — I2510 Atherosclerotic heart disease of native coronary artery without angina pectoris: Secondary | ICD-10-CM | POA: Diagnosis not present

## 2017-05-12 DIAGNOSIS — M14672 Charcot's joint, left ankle and foot: Secondary | ICD-10-CM | POA: Diagnosis not present

## 2017-05-12 DIAGNOSIS — T8130XD Disruption of wound, unspecified, subsequent encounter: Secondary | ICD-10-CM | POA: Diagnosis not present

## 2017-05-13 DIAGNOSIS — Y835 Amputation of limb(s) as the cause of abnormal reaction of the patient, or of later complication, without mention of misadventure at the time of the procedure: Secondary | ICD-10-CM | POA: Diagnosis present

## 2017-05-13 DIAGNOSIS — L03116 Cellulitis of left lower limb: Secondary | ICD-10-CM | POA: Diagnosis not present

## 2017-05-13 DIAGNOSIS — R278 Other lack of coordination: Secondary | ICD-10-CM | POA: Diagnosis not present

## 2017-05-13 DIAGNOSIS — M25561 Pain in right knee: Secondary | ICD-10-CM | POA: Diagnosis not present

## 2017-05-13 DIAGNOSIS — T148XXA Other injury of unspecified body region, initial encounter: Secondary | ICD-10-CM | POA: Diagnosis not present

## 2017-05-13 DIAGNOSIS — Z951 Presence of aortocoronary bypass graft: Secondary | ICD-10-CM | POA: Diagnosis not present

## 2017-05-13 DIAGNOSIS — L03818 Cellulitis of other sites: Secondary | ICD-10-CM | POA: Diagnosis not present

## 2017-05-13 DIAGNOSIS — R262 Difficulty in walking, not elsewhere classified: Secondary | ICD-10-CM | POA: Diagnosis not present

## 2017-05-13 DIAGNOSIS — T8789 Other complications of amputation stump: Secondary | ICD-10-CM | POA: Diagnosis not present

## 2017-05-13 DIAGNOSIS — R2681 Unsteadiness on feet: Secondary | ICD-10-CM | POA: Diagnosis not present

## 2017-05-13 DIAGNOSIS — M6281 Muscle weakness (generalized): Secondary | ICD-10-CM | POA: Diagnosis not present

## 2017-05-13 DIAGNOSIS — R509 Fever, unspecified: Secondary | ICD-10-CM | POA: Diagnosis not present

## 2017-05-13 DIAGNOSIS — M79662 Pain in left lower leg: Secondary | ICD-10-CM | POA: Diagnosis not present

## 2017-05-13 DIAGNOSIS — I517 Cardiomegaly: Secondary | ICD-10-CM | POA: Diagnosis not present

## 2017-05-13 DIAGNOSIS — I2510 Atherosclerotic heart disease of native coronary artery without angina pectoris: Secondary | ICD-10-CM | POA: Diagnosis not present

## 2017-05-13 DIAGNOSIS — Z79899 Other long term (current) drug therapy: Secondary | ICD-10-CM | POA: Diagnosis not present

## 2017-05-13 DIAGNOSIS — R2689 Other abnormalities of gait and mobility: Secondary | ICD-10-CM | POA: Diagnosis not present

## 2017-05-13 DIAGNOSIS — T8744 Infection of amputation stump, left lower extremity: Secondary | ICD-10-CM | POA: Diagnosis not present

## 2017-05-13 DIAGNOSIS — G546 Phantom limb syndrome with pain: Secondary | ICD-10-CM | POA: Diagnosis not present

## 2017-05-13 DIAGNOSIS — S8992XA Unspecified injury of left lower leg, initial encounter: Secondary | ICD-10-CM | POA: Diagnosis not present

## 2017-05-13 DIAGNOSIS — G8929 Other chronic pain: Secondary | ICD-10-CM | POA: Diagnosis not present

## 2017-05-13 DIAGNOSIS — L89620 Pressure ulcer of left heel, unstageable: Secondary | ICD-10-CM | POA: Diagnosis not present

## 2017-05-13 DIAGNOSIS — M79606 Pain in leg, unspecified: Secondary | ICD-10-CM | POA: Diagnosis not present

## 2017-05-13 DIAGNOSIS — I10 Essential (primary) hypertension: Secondary | ICD-10-CM | POA: Diagnosis not present

## 2017-05-13 DIAGNOSIS — T8740 Infection of amputation stump, unspecified extremity: Secondary | ICD-10-CM | POA: Diagnosis not present

## 2017-05-13 DIAGNOSIS — L089 Local infection of the skin and subcutaneous tissue, unspecified: Secondary | ICD-10-CM | POA: Diagnosis not present

## 2017-05-13 DIAGNOSIS — I252 Old myocardial infarction: Secondary | ICD-10-CM | POA: Diagnosis not present

## 2017-05-13 DIAGNOSIS — Z89512 Acquired absence of left leg below knee: Secondary | ICD-10-CM | POA: Diagnosis not present

## 2017-05-13 DIAGNOSIS — Z6841 Body Mass Index (BMI) 40.0 and over, adult: Secondary | ICD-10-CM | POA: Diagnosis not present

## 2017-05-13 DIAGNOSIS — Z7982 Long term (current) use of aspirin: Secondary | ICD-10-CM | POA: Diagnosis not present

## 2017-05-13 DIAGNOSIS — E785 Hyperlipidemia, unspecified: Secondary | ICD-10-CM | POA: Diagnosis not present

## 2017-05-13 DIAGNOSIS — Z85828 Personal history of other malignant neoplasm of skin: Secondary | ICD-10-CM | POA: Diagnosis not present

## 2017-05-13 DIAGNOSIS — T8131XS Disruption of external operation (surgical) wound, not elsewhere classified, sequela: Secondary | ICD-10-CM | POA: Diagnosis not present

## 2017-05-13 DIAGNOSIS — T8131XA Disruption of external operation (surgical) wound, not elsewhere classified, initial encounter: Secondary | ICD-10-CM | POA: Diagnosis not present

## 2017-05-13 DIAGNOSIS — M14672 Charcot's joint, left ankle and foot: Secondary | ICD-10-CM | POA: Diagnosis not present

## 2017-05-13 DIAGNOSIS — M86172 Other acute osteomyelitis, left ankle and foot: Secondary | ICD-10-CM | POA: Diagnosis not present

## 2017-05-13 DIAGNOSIS — M7989 Other specified soft tissue disorders: Secondary | ICD-10-CM | POA: Diagnosis not present

## 2017-05-13 DIAGNOSIS — Z87891 Personal history of nicotine dependence: Secondary | ICD-10-CM | POA: Diagnosis not present

## 2017-05-16 DIAGNOSIS — M79662 Pain in left lower leg: Secondary | ICD-10-CM | POA: Diagnosis not present

## 2017-05-16 DIAGNOSIS — I2510 Atherosclerotic heart disease of native coronary artery without angina pectoris: Secondary | ICD-10-CM | POA: Diagnosis not present

## 2017-05-16 DIAGNOSIS — G546 Phantom limb syndrome with pain: Secondary | ICD-10-CM | POA: Diagnosis not present

## 2017-05-16 DIAGNOSIS — M25561 Pain in right knee: Secondary | ICD-10-CM | POA: Diagnosis not present

## 2017-05-16 DIAGNOSIS — R2689 Other abnormalities of gait and mobility: Secondary | ICD-10-CM | POA: Diagnosis not present

## 2017-05-16 DIAGNOSIS — I10 Essential (primary) hypertension: Secondary | ICD-10-CM | POA: Diagnosis not present

## 2017-05-16 DIAGNOSIS — Z89512 Acquired absence of left leg below knee: Secondary | ICD-10-CM | POA: Diagnosis not present

## 2017-05-17 DIAGNOSIS — I10 Essential (primary) hypertension: Secondary | ICD-10-CM | POA: Diagnosis not present

## 2017-05-17 DIAGNOSIS — Z89512 Acquired absence of left leg below knee: Secondary | ICD-10-CM | POA: Diagnosis not present

## 2017-05-17 DIAGNOSIS — I2510 Atherosclerotic heart disease of native coronary artery without angina pectoris: Secondary | ICD-10-CM | POA: Diagnosis not present

## 2017-05-18 ENCOUNTER — Ambulatory Visit: Payer: Medicare Other | Admitting: Cardiology

## 2017-05-19 ENCOUNTER — Encounter (INDEPENDENT_AMBULATORY_CARE_PROVIDER_SITE_OTHER): Payer: Self-pay | Admitting: Orthopedic Surgery

## 2017-05-19 ENCOUNTER — Ambulatory Visit (INDEPENDENT_AMBULATORY_CARE_PROVIDER_SITE_OTHER): Payer: Medicare Other | Admitting: Orthopedic Surgery

## 2017-05-19 VITALS — Ht 66.0 in | Wt 263.0 lb

## 2017-05-19 DIAGNOSIS — Z89512 Acquired absence of left leg below knee: Secondary | ICD-10-CM

## 2017-05-19 DIAGNOSIS — G546 Phantom limb syndrome with pain: Secondary | ICD-10-CM | POA: Diagnosis not present

## 2017-05-19 NOTE — Progress Notes (Signed)
Office Visit Note   Patient: Julia West           Date of Birth: May 26, 1955           MRN: [id number] Visit Date: 05/19/2017              Requested by: [name], MD [place], [place] [address] PCP: [name], MD  Chief Complaint  Patient presents with  . Left Leg - Routine Post Op    05/06/17 left BKA      HPI: Patient presents in follow-up for left transtibial amputation.  She states she does have phantom pain.  Assessment & Plan: Visit Diagnoses:  1. Acquired absence of left leg below knee [facility])     Plan: [facility] is at [facility].  She is given a prescription for a biotech for a stump shrinker as well as a K2 level prosthesis.  Patient will follow-up in 2 weeks to remove the sutures.  Follow-Up Instructions: Return in about 2 weeks (around 06/02/2017).   Ortho Exam  Patient is alert, oriented, no adenopathy, well-dressed, normal affect, normal respiratory effort. Examination the wound edges are well approximated.  Patient has 2 areas of superficial eschar which are approximately 2 cm in diameter 0.1 mm deep from her microcirculatory insufficiency.  There is no wound dehiscence no cellulitis no drainage no odor no signs of infection.  Patient refused to have the staples removed today.  Patient's daughters are with her during the exam.  Imaging: No results found. No images are attached to the encounter.  Labs: Lab Results  Component Value Date   HGBA1C 5.4 05/09/2017   HGBA1C 5.6 02/07/2017   REPTSTATUS 05/08/2017 FINAL 05/07/2017   GRAMSTAIN  04/24/2012    NO WBC SEEN NO SQUAMOUS EPITHELIAL CELLS SEEN MODERATE GRAM POSITIVE COCCI IN PAIRS RARE GRAM NEGATIVE RODS   GRAMSTAIN  04/24/2012    NO WBC SEEN NO SQUAMOUS EPITHELIAL CELLS SEEN MODERATE GRAM POSITIVE COCCI IN PAIRS RARE GRAM NEGATIVE RODS   CULT  05/07/2017    NO GROWTH Performed at [facility], [place] [address] [place], [place] [address]    [place] 04/24/2012    @LABSALLVALUES (HGBA1)@  Body mass index is 42.45 kg/m.  Orders:  No orders of the defined types were placed in this encounter.  No orders of the defined types were placed in this encounter.    Procedures: No procedures performed  Clinical Data: No additional findings.  ROS:  All other systems negative, except as noted in the HPI. Review of Systems  Objective: Vital Signs: Ht 5\' 6"  (1.676 m)   Wt 263 lb (119.3 kg)   BMI 42.45 kg/m   Specialty Comments:  No specialty comments available.  PMFS History: Patient Active Problem List   Diagnosis Date Noted  . Acquired absence of left leg below knee ([place]) 05/19/2017  . Bradycardia 05/07/2017  . Depression 05/07/2017  . Lymphedema 05/07/2017  . S/P BKA (below knee amputation) unilateral, left ([place]) 05/06/2017  . Gangrene of left foot ([place])   . Wound dehiscence, surgical, sequela   . Charcot foot due to diabetes mellitus ([place]) 02/09/2017  . 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 Post-menopausal bleeding 04/15/2014  . Endometrial polyp 04/09/2014  . Ventral hernia 03/06/2012  . Open abdominal wall wound 03/06/2012  . Hyperlipemia 11/12/2008  . OBESITY, MORBID 11/12/2008  . Essential hypertension 11/12/2008  . CAD (coronary artery disease)  11/12/2008   Past Medical History:  Diagnosis Date  . Acute myocardial infarction    with a ruptured plaque in the circumflex in 2003  . Anxiety   . Cancer ([place])    basal cell face  . Complication of anesthesia    states low O2 sats post-op 11/13, always slow to awaken  . Coronary artery disease   . Degenerative joint disease   . Dyspnea    with activity- car to house - steps  . Heart murmur    "not to be concerned"  . HTN (hypertension)   . Hyperlipidemia LDL goal <70   . Internal and external hemorrhoids without complication   . Lymphedema    right side of body  . Lymphedema of arm    right  . Morbid obesity  ([place])   . Surgical wound, non healing ABDOMINAL    has wound vac @ 125 mm Hg    Family History  Problem Relation Age of Onset  . Heart attack Mother   . Hypertension Sister   . Diabetes Brother   . Birth defects Son        heart defect    Past Surgical History:  Procedure Laterality Date  . AMPUTATION Left 05/06/2017   Procedure: BELOW KNEE AMPUTATION;  Surgeon: Newt Minion, MD;  Location: [place];  Service: Orthopedics;  Laterality: Left;  . APPLICATION OF WOUND VAC    . BOWEL RESECTION  02/07/2012   Procedure: SMALL BOWEL RESECTION;  Surgeon: [place] Curry, MD,FACS;  Location: [place];  Service: General;;  . CARDIOVASCULAR STRESS TEST  01-17-2012  DR HOCHREIN   LOW RISK NUCLEAR TEST  . CESAREAN SECTION     x 4 in remote past  . CHOLECYSTECTOMY    . CORONARY ARTERY BYPASS GRAFT  2003   by Dr. Servando Snare. LIMA to the LAD, free RIMA to the circumflex. Stress perfusion study December 2009 with no high-risk areas of ischemia. She has a well-preserved ejection fraction  . HYSTEROSCOPY W/D&C N/A 05/14/2014   Procedure: DILATATION AND CURETTAGE (no specimen); HYSTEROSCOPY;  Surgeon: Jonnie Kind, MD;  Location: [facility];  Service: Gynecology;  Laterality: N/A;  . INCISION AND DRAINAGE OF WOUND  04/17/2012   Procedure: IRRIGATION AND DEBRIDEMENT WOUND;  Surgeon: Theodoro Kos, DO;  Location: [place];  Service: Plastics;  Laterality: N/A;  OF ABDOMINAL WOUND, SURGICAL PREP AND PLACEMENT OF VAC, REMOVAL FOREHEAD SKIN LESION  . INCISION AND DRAINAGE OF WOUND N/A 04/24/2012   Procedure: IRRIGATION AND DEBRIDEMENT WOUND;  Surgeon: Theodoro Kos, DO;  Location: [place];  Service: Plastics;  Laterality: N/A;  I & D ABDOMINAL WOUND WITH VAC AND ACELL  . INCISION AND DRAINAGE OF WOUND N/A 05/01/2012   Procedure: IRRIGATION AND DEBRIDEMENT WOUND;  Surgeon: Theodoro Kos, DO;  Location: [place];  Service: Plastics;  Laterality: N/A;  WITH SURGICAL PREP  AND PLACEMENT OF VAC  . INCISION AND DRAINAGE OF WOUND N/A 05/08/2012   Procedure: IRRIGATION AND DEBRIDEMENT OF ABD WOUND SURGICAL PREP AND PLACEMENT OF VAC ;  Surgeon: Theodoro Kos, DO;  Location: [place];  Service: Plastics;  Laterality: N/A;  IRRIGATION AND DEBRIDEMENT OF ABD WOUND SURGICAL PREP AND PLACEMENT OF VAC   . INCISION AND DRAINAGE OF WOUND N/A 05/15/2012   Procedure: IRRIGATION AND DEBRIDEMENT OF ABDOMINAL WOUND WITH POSSIBLE SURGICAL PREP AND PLACEMENT OF VAC;  Surgeon: Theodoro Kos, DO;  Location: [place];  Service:  Plastics;  Laterality: N/A;  . INCISION AND DRAINAGE OF WOUND N/A 05/22/2012   Procedure: IRRIGATION AND DEBRIDEMENT OF ABODOMINAL WOUND WITH  SURGICAL PREP AND VAC PLACEMENT;  Surgeon: Theodoro Kos, DO;  Location: [place];  Service: Plastics;  Laterality: N/A;  . INCISION AND DRAINAGE OF WOUND N/A 06/28/2012   Procedure: IRRIGATION AND DEBRIDEMENT OF ABDOMINAL ULCER SURGICAL PREP AND PLACEMENT OF ACELL AND VAC;  Surgeon: Theodoro Kos, DO;  Location: [place];  Service: Plastics;  Laterality: N/A;  . INCISIONAL HERNIA REPAIR  02/07/2012   Procedure: HERNIA REPAIR INCISIONAL;  Surgeon: [place] Curry, MD,FACS;  Location: [place];  Service: General;;  Open, Primary repair, strangulated Incisional hernia.  Marland Kitchen LESION REMOVAL  04/17/2012   Procedure: LESION REMOVAL;  Surgeon: Theodoro Kos, DO;  Location: [place];  Service: Plastics;  Laterality: N/A;   CENTER OF FOREHEAD, REMOVAL FORHEAD SKIN LESION  . ORIF TOE FRACTURE Left 02/09/2017   Procedure: EXCISION TALAR HEAD, INTERNAL FIXATION MEDIAL COLUMN LEFT FOOT;  Surgeon: Newt Minion, MD;  Location: [place];  Service: Orthopedics;  Laterality: Left;  . POLYPECTOMY N/A 05/14/2014   Procedure: ENDOMETRIAL POLYPECTOMY;  Surgeon: Jonnie Kind, MD;  Location: [facility];  Service: Gynecology;  Laterality: N/A;   Social History   Occupational  History  . Occupation: Estate manager/land agent    Comment: In the [employer]  Tobacco Use  . Smoking status: Former Smoker    Packs/day: 1.00    Years: 25.00    Pack years: 25.00    Types: Cigarettes    Last attempt to quit: 11/17/2011    Years since quitting: 5.5  . Smokeless tobacco: Never Used  Substance and Sexual Activity  . Alcohol use: No  . Drug use: No  . Sexual activity: Yes    Birth control/protection: Post-menopausal

## 2017-05-22 ENCOUNTER — Other Ambulatory Visit: Payer: Self-pay | Admitting: Pediatrics

## 2017-05-23 DIAGNOSIS — Z89512 Acquired absence of left leg below knee: Secondary | ICD-10-CM | POA: Diagnosis not present

## 2017-05-23 DIAGNOSIS — G546 Phantom limb syndrome with pain: Secondary | ICD-10-CM | POA: Diagnosis not present

## 2017-05-23 DIAGNOSIS — R2689 Other abnormalities of gait and mobility: Secondary | ICD-10-CM | POA: Diagnosis not present

## 2017-05-23 DIAGNOSIS — M25561 Pain in right knee: Secondary | ICD-10-CM | POA: Diagnosis not present

## 2017-05-23 DIAGNOSIS — M79662 Pain in left lower leg: Secondary | ICD-10-CM | POA: Diagnosis not present

## 2017-05-23 NOTE — Telephone Encounter (Signed)
Last seen 12/10/16

## 2017-05-25 DIAGNOSIS — M25561 Pain in right knee: Secondary | ICD-10-CM | POA: Diagnosis not present

## 2017-05-25 DIAGNOSIS — R2689 Other abnormalities of gait and mobility: Secondary | ICD-10-CM | POA: Diagnosis not present

## 2017-05-25 DIAGNOSIS — M79662 Pain in left lower leg: Secondary | ICD-10-CM | POA: Diagnosis not present

## 2017-05-25 DIAGNOSIS — G546 Phantom limb syndrome with pain: Secondary | ICD-10-CM | POA: Diagnosis not present

## 2017-05-25 DIAGNOSIS — Z89512 Acquired absence of left leg below knee: Secondary | ICD-10-CM | POA: Diagnosis not present

## 2017-06-01 DIAGNOSIS — M25561 Pain in right knee: Secondary | ICD-10-CM | POA: Diagnosis not present

## 2017-06-01 DIAGNOSIS — G546 Phantom limb syndrome with pain: Secondary | ICD-10-CM | POA: Diagnosis not present

## 2017-06-01 DIAGNOSIS — M79662 Pain in left lower leg: Secondary | ICD-10-CM | POA: Diagnosis not present

## 2017-06-01 DIAGNOSIS — R2689 Other abnormalities of gait and mobility: Secondary | ICD-10-CM | POA: Diagnosis not present

## 2017-06-01 DIAGNOSIS — Z89512 Acquired absence of left leg below knee: Secondary | ICD-10-CM | POA: Diagnosis not present

## 2017-06-02 ENCOUNTER — Ambulatory Visit (INDEPENDENT_AMBULATORY_CARE_PROVIDER_SITE_OTHER): Payer: Medicare Other | Admitting: Orthopedic Surgery

## 2017-06-02 ENCOUNTER — Encounter (INDEPENDENT_AMBULATORY_CARE_PROVIDER_SITE_OTHER): Payer: Self-pay | Admitting: Orthopedic Surgery

## 2017-06-02 VITALS — Ht 66.0 in | Wt 263.0 lb

## 2017-06-02 DIAGNOSIS — Z89512 Acquired absence of left leg below knee: Secondary | ICD-10-CM

## 2017-06-02 NOTE — Progress Notes (Signed)
Office Visit Note   Patient: Julia West           Date of Birth: May 04, 1955           MRN: [id number] Visit Date: 06/02/2017              Requested by: [name], MD [place], [place] [id number] PCP: [name], MD  Chief Complaint  Patient presents with  . Left Leg - Routine Post Op    05/06/17 Left BKA      HPI: Patient is a 62 year old woman who presents follow-up status post left transtibial amputation she is currently at skilled nursing.  She has appointment with biotech for a stump shrinker for the next week.  Patient states that she can barely stand a minute on the right lower extremity.  She complains of increased swelling of the right lower extremity and complains of swelling in the left lower extremity.  Assessment & Plan: Visit Diagnoses:  1. Acquired absence of left leg below knee (HCC)     Plan: Discussed the importance of compression for the right lower extremity.  Her calf is 55 cm in circumference and will need a double extra-large medical compression stocking a prescription was provided.  The importance of ensuring there is no wrinkles in the office we will down to make sure it does not cause a pressure point proximally.  Discussed the importance of exercise and elevation to decrease the swelling in both lower extremities.  Discussed that her eschar on the left leg appears superficial but the if this is deep we would need to plan for debridement surgery.  Discussed that with her difficulty standing that she most likely would require a scooter would not be a good candidate for prosthesis.  Discussed that she improves her strength in the right lower extremity she would then be a good candidate for a prosthesis and would not recommend a motorized scooter.  Follow-Up Instructions: Return in about 3 weeks (around 06/23/2017).   Ortho Exam  Patient is alert, oriented, no adenopathy, well-dressed, normal affect, normal respiratory  effort. Examination patient has massive swelling in both lower extremities there are no ulcers left leg has a eschar that is about 3 x 4 cm and appears superficial with 1 mm of depth.  The wound edges are well approximated we will harvest the sutures today.  She has significant pitting edema in the right lower extremity with her calf measuring 55 cm in circumference.  Patient's legs are dependent and she is only able to stand about a minute a day.  Imaging: No results found. No images are attached to the encounter.  Labs: Lab Results  Component Value Date   HGBA1C 5.4 05/09/2017   HGBA1C 5.6 02/07/2017   REPTSTATUS 05/08/2017 FINAL 05/07/2017   GRAMSTAIN  04/24/2012    NO WBC SEEN NO SQUAMOUS EPITHELIAL CELLS SEEN MODERATE GRAM POSITIVE COCCI IN PAIRS RARE GRAM NEGATIVE RODS   GRAMSTAIN  04/24/2012    NO WBC SEEN NO SQUAMOUS EPITHELIAL CELLS SEEN MODERATE GRAM POSITIVE COCCI IN PAIRS RARE GRAM NEGATIVE RODS   CULT  05/07/2017    NO GROWTH Performed at [facility], [place] [address] [place], [place] [address]    [place] 04/24/2012    @LABSALLVALUES (HGBA1)@  Body mass index is 42.45 kg/m.  Orders:  No orders of the defined types were placed in this encounter.  No orders of the defined types were placed in this encounter.  Procedures: No procedures performed  Clinical Data: No additional findings.  ROS:  All other systems negative, except as noted in the HPI. Review of Systems  Objective: Vital Signs: Ht 5\' 6"  (1.676 m)   Wt 263 lb (119.3 kg)   BMI 42.45 kg/m   Specialty Comments:  No specialty comments available.  PMFS History: Patient Active Problem List   Diagnosis Date Noted  . Acquired absence of left leg below knee ([place]) 05/19/2017  . Bradycardia 05/07/2017  . Depression 05/07/2017  . Lymphedema 05/07/2017  . S/P BKA (below knee amputation) unilateral, left ([place]) 05/06/2017  . Gangrene of left foot ([place])   . Wound  dehiscence, surgical, sequela   . Charcot foot due to diabetes mellitus ([place]) 02/09/2017  . 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 Post-menopausal bleeding 04/15/2014  . Endometrial polyp 04/09/2014  . Ventral hernia 03/06/2012  . Open abdominal wall wound 03/06/2012  . Hyperlipemia 11/12/2008  . OBESITY, MORBID 11/12/2008  . Essential hypertension 11/12/2008  . CAD (coronary artery disease) 11/12/2008   Past Medical History:  Diagnosis Date  . Acute myocardial infarction    with a ruptured plaque in the circumflex in 2003  . Anxiety   . Cancer ([place])    basal cell face  . Complication of anesthesia    states low O2 sats post-op 11/13, always slow to awaken  . Coronary artery disease   . Degenerative joint disease   . Dyspnea    with activity- car to house - steps  . Heart murmur    "not to be concerned"  . HTN (hypertension)   . Hyperlipidemia LDL goal <70   . Internal and external hemorrhoids without complication   . Lymphedema    right side of body  . Lymphedema of arm    right  . Morbid obesity ([place])   . Surgical wound, non healing ABDOMINAL    has wound vac @ 125 mm Hg    Family History  Problem Relation Age of Onset  . Heart attack Mother   . Hypertension Sister   . Diabetes Brother   . Birth defects Son        heart defect    Past Surgical History:  Procedure Laterality Date  . AMPUTATION Left 05/06/2017   Procedure: BELOW KNEE AMPUTATION;  Surgeon: Newt Minion, MD;  Location: [place];  Service: Orthopedics;  Laterality: Left;  . APPLICATION OF WOUND VAC    . BOWEL RESECTION  02/07/2012   Procedure: SMALL BOWEL RESECTION;  Surgeon: [place] Curry, MD,FACS;  Location: [place];  Service: General;;  . CARDIOVASCULAR STRESS TEST  01-17-2012  DR HOCHREIN   LOW RISK NUCLEAR TEST  . CESAREAN SECTION     x 4 in remote past  . CHOLECYSTECTOMY    . CORONARY ARTERY BYPASS GRAFT  2003   by Dr. Servando Snare. LIMA to the LAD, free RIMA to the  circumflex. Stress perfusion study December 2009 with no high-risk areas of ischemia. She has a well-preserved ejection fraction  . HYSTEROSCOPY W/D&C N/A 05/14/2014   Procedure: DILATATION AND CURETTAGE (no specimen); HYSTEROSCOPY;  Surgeon: Jonnie Kind, MD;  Location: [facility];  Service: Gynecology;  Laterality: N/A;  . INCISION AND DRAINAGE OF WOUND  04/17/2012   Procedure: IRRIGATION AND DEBRIDEMENT WOUND;  Surgeon: Theodoro Kos, DO;  Location: [place];  Service: Plastics;  Laterality: N/A;  OF ABDOMINAL WOUND, SURGICAL PREP AND PLACEMENT OF VAC,  REMOVAL FOREHEAD SKIN LESION  . INCISION AND DRAINAGE OF WOUND N/A 04/24/2012   Procedure: IRRIGATION AND DEBRIDEMENT WOUND;  Surgeon: Theodoro Kos, DO;  Location: [place];  Service: Plastics;  Laterality: N/A;  I & D ABDOMINAL WOUND WITH VAC AND ACELL  . INCISION AND DRAINAGE OF WOUND N/A 05/01/2012   Procedure: IRRIGATION AND DEBRIDEMENT WOUND;  Surgeon: Theodoro Kos, DO;  Location: [place];  Service: Plastics;  Laterality: N/A;  WITH SURGICAL PREP AND PLACEMENT OF VAC  . INCISION AND DRAINAGE OF WOUND N/A 05/08/2012   Procedure: IRRIGATION AND DEBRIDEMENT OF ABD WOUND SURGICAL PREP AND PLACEMENT OF VAC ;  Surgeon: Theodoro Kos, DO;  Location: [place];  Service: Plastics;  Laterality: N/A;  IRRIGATION AND DEBRIDEMENT OF ABD WOUND SURGICAL PREP AND PLACEMENT OF VAC   . INCISION AND DRAINAGE OF WOUND N/A 05/15/2012   Procedure: IRRIGATION AND DEBRIDEMENT OF ABDOMINAL WOUND WITH POSSIBLE SURGICAL PREP AND PLACEMENT OF VAC;  Surgeon: Theodoro Kos, DO;  Location: [facility];  Service: Plastics;  Laterality: N/A;  . INCISION AND DRAINAGE OF WOUND N/A 05/22/2012   Procedure: IRRIGATION AND DEBRIDEMENT OF ABODOMINAL WOUND WITH  SURGICAL PREP AND VAC PLACEMENT;  Surgeon: Theodoro Kos, DO;  Location: [facility];  Service: Plastics;  Laterality: N/A;  .  INCISION AND DRAINAGE OF WOUND N/A 06/28/2012   Procedure: IRRIGATION AND DEBRIDEMENT OF ABDOMINAL ULCER SURGICAL PREP AND PLACEMENT OF ACELL AND VAC;  Surgeon: Theodoro Kos, DO;  Location: [facility];  Service: Plastics;  Laterality: N/A;  . INCISIONAL HERNIA REPAIR  02/07/2012   Procedure: HERNIA REPAIR INCISIONAL;  Surgeon: Gay[place] Curry, MD,FACS;  Location: [place];  Service: General;;  Open, Primary repair, strangulated Incisional hernia.  Marland Kitchen LESION REMOVAL  04/17/2012   Procedure: LESION REMOVAL;  Surgeon: Theodoro Kos, DO;  Location: [place];  Service: Plastics;  Laterality: N/A;   CENTER OF FOREHEAD, REMOVAL FORHEAD SKIN LESION  . ORIF TOE FRACTURE Left 02/09/2017   Procedure: EXCISION TALAR HEAD, INTERNAL FIXATION MEDIAL COLUMN LEFT FOOT;  Surgeon: Newt Minion, MD;  Location: [facility];  Service: Orthopedics;  Laterality: Left;  . POLYPECTOMY N/A 05/14/2014   Procedure: ENDOMETRIAL POLYPECTOMY;  Surgeon: Jonnie Kind, MD;  Location: [facility];  Service: Gynecology;  Laterality: N/A;   Social History   Occupational History  . Occupation: Estate manager/land agent    Comment: In the [employer]  Tobacco Use  . Smoking status: Former Smoker    Packs/day: 1.00    Years: 25.00    Pack years: 25.00    Types: Cigarettes    Last attempt to quit: 11/17/2011    Years since quitting: 5.5  . Smokeless tobacco: Never Used  Substance and Sexual Activity  . Alcohol use: No  . Drug use: No  . Sexual activity: Yes    Birth control/protection: Post-menopausal

## 2017-06-03 DIAGNOSIS — T8789 Other complications of amputation stump: Secondary | ICD-10-CM | POA: Diagnosis not present

## 2017-06-07 ENCOUNTER — Ambulatory Visit (INDEPENDENT_AMBULATORY_CARE_PROVIDER_SITE_OTHER): Payer: Medicare Other

## 2017-06-07 DIAGNOSIS — L89620 Pressure ulcer of left heel, unstageable: Secondary | ICD-10-CM

## 2017-06-07 DIAGNOSIS — M86172 Other acute osteomyelitis, left ankle and foot: Secondary | ICD-10-CM | POA: Diagnosis not present

## 2017-06-07 DIAGNOSIS — G8929 Other chronic pain: Secondary | ICD-10-CM | POA: Diagnosis not present

## 2017-06-07 DIAGNOSIS — M14672 Charcot's joint, left ankle and foot: Secondary | ICD-10-CM | POA: Diagnosis not present

## 2017-06-08 DIAGNOSIS — M79662 Pain in left lower leg: Secondary | ICD-10-CM | POA: Diagnosis not present

## 2017-06-08 DIAGNOSIS — M25561 Pain in right knee: Secondary | ICD-10-CM | POA: Diagnosis not present

## 2017-06-08 DIAGNOSIS — R2689 Other abnormalities of gait and mobility: Secondary | ICD-10-CM | POA: Diagnosis not present

## 2017-06-08 DIAGNOSIS — Z89512 Acquired absence of left leg below knee: Secondary | ICD-10-CM | POA: Diagnosis not present

## 2017-06-08 DIAGNOSIS — G546 Phantom limb syndrome with pain: Secondary | ICD-10-CM | POA: Diagnosis not present

## 2017-06-15 DIAGNOSIS — Z89512 Acquired absence of left leg below knee: Secondary | ICD-10-CM | POA: Diagnosis not present

## 2017-06-15 DIAGNOSIS — M25561 Pain in right knee: Secondary | ICD-10-CM | POA: Diagnosis not present

## 2017-06-15 DIAGNOSIS — R2689 Other abnormalities of gait and mobility: Secondary | ICD-10-CM | POA: Diagnosis not present

## 2017-06-15 DIAGNOSIS — M79662 Pain in left lower leg: Secondary | ICD-10-CM | POA: Diagnosis not present

## 2017-06-15 DIAGNOSIS — G546 Phantom limb syndrome with pain: Secondary | ICD-10-CM | POA: Diagnosis not present

## 2017-06-17 ENCOUNTER — Ambulatory Visit: Payer: Medicare Other | Admitting: Family Medicine

## 2017-06-17 DIAGNOSIS — Z89512 Acquired absence of left leg below knee: Secondary | ICD-10-CM | POA: Diagnosis not present

## 2017-06-17 DIAGNOSIS — M79662 Pain in left lower leg: Secondary | ICD-10-CM | POA: Diagnosis not present

## 2017-06-17 DIAGNOSIS — M25561 Pain in right knee: Secondary | ICD-10-CM | POA: Diagnosis not present

## 2017-06-17 DIAGNOSIS — R2689 Other abnormalities of gait and mobility: Secondary | ICD-10-CM | POA: Diagnosis not present

## 2017-06-17 DIAGNOSIS — G546 Phantom limb syndrome with pain: Secondary | ICD-10-CM | POA: Diagnosis not present

## 2017-06-19 ENCOUNTER — Emergency Department (HOSPITAL_COMMUNITY): Payer: Medicare Other

## 2017-06-19 ENCOUNTER — Inpatient Hospital Stay (HOSPITAL_COMMUNITY)
Admission: EM | Admit: 2017-06-19 | Discharge: 2017-06-23 | DRG: 501 | Disposition: A | Discharge status: Skilled Nursing Facility | Payer: Medicare Other | Attending: Internal Medicine | Admitting: Internal Medicine

## 2017-06-19 ENCOUNTER — Encounter (HOSPITAL_COMMUNITY): Payer: Self-pay | Admitting: *Deleted

## 2017-06-19 ENCOUNTER — Other Ambulatory Visit: Payer: Self-pay

## 2017-06-19 DIAGNOSIS — I10 Essential (primary) hypertension: Secondary | ICD-10-CM | POA: Diagnosis present

## 2017-06-19 DIAGNOSIS — M79606 Pain in leg, unspecified: Secondary | ICD-10-CM | POA: Diagnosis not present

## 2017-06-19 DIAGNOSIS — Y835 Amputation of limb(s) as the cause of abnormal reaction of the patient, or of later complication, without mention of misadventure at the time of the procedure: Secondary | ICD-10-CM | POA: Diagnosis present

## 2017-06-19 DIAGNOSIS — L03116 Cellulitis of left lower limb: Secondary | ICD-10-CM | POA: Diagnosis present

## 2017-06-19 DIAGNOSIS — I2510 Atherosclerotic heart disease of native coronary artery without angina pectoris: Secondary | ICD-10-CM | POA: Diagnosis present

## 2017-06-19 DIAGNOSIS — S81802A Unspecified open wound, left lower leg, initial encounter: Secondary | ICD-10-CM

## 2017-06-19 DIAGNOSIS — L8992 Pressure ulcer of unspecified site, stage 2: Secondary | ICD-10-CM | POA: Diagnosis not present

## 2017-06-19 DIAGNOSIS — Z87891 Personal history of nicotine dependence: Secondary | ICD-10-CM | POA: Diagnosis not present

## 2017-06-19 DIAGNOSIS — Z85828 Personal history of other malignant neoplasm of skin: Secondary | ICD-10-CM

## 2017-06-19 DIAGNOSIS — Z79899 Other long term (current) drug therapy: Secondary | ICD-10-CM | POA: Diagnosis not present

## 2017-06-19 DIAGNOSIS — L089 Local infection of the skin and subcutaneous tissue, unspecified: Secondary | ICD-10-CM

## 2017-06-19 DIAGNOSIS — E785 Hyperlipidemia, unspecified: Secondary | ICD-10-CM | POA: Diagnosis present

## 2017-06-19 DIAGNOSIS — Z951 Presence of aortocoronary bypass graft: Secondary | ICD-10-CM

## 2017-06-19 DIAGNOSIS — Z89512 Acquired absence of left leg below knee: Secondary | ICD-10-CM | POA: Diagnosis not present

## 2017-06-19 DIAGNOSIS — Z6841 Body Mass Index (BMI) 40.0 and over, adult: Secondary | ICD-10-CM | POA: Diagnosis not present

## 2017-06-19 DIAGNOSIS — L03818 Cellulitis of other sites: Secondary | ICD-10-CM | POA: Diagnosis not present

## 2017-06-19 DIAGNOSIS — Z7982 Long term (current) use of aspirin: Secondary | ICD-10-CM

## 2017-06-19 DIAGNOSIS — T8131XS Disruption of external operation (surgical) wound, not elsewhere classified, sequela: Secondary | ICD-10-CM | POA: Diagnosis not present

## 2017-06-19 DIAGNOSIS — T8789 Other complications of amputation stump: Secondary | ICD-10-CM | POA: Diagnosis not present

## 2017-06-19 DIAGNOSIS — T8740 Infection of amputation stump, unspecified extremity: Secondary | ICD-10-CM | POA: Diagnosis not present

## 2017-06-19 DIAGNOSIS — M79662 Pain in left lower leg: Secondary | ICD-10-CM | POA: Diagnosis not present

## 2017-06-19 DIAGNOSIS — G546 Phantom limb syndrome with pain: Secondary | ICD-10-CM | POA: Diagnosis not present

## 2017-06-19 DIAGNOSIS — I252 Old myocardial infarction: Secondary | ICD-10-CM | POA: Diagnosis not present

## 2017-06-19 DIAGNOSIS — T8131XA Disruption of external operation (surgical) wound, not elsewhere classified, initial encounter: Secondary | ICD-10-CM | POA: Diagnosis present

## 2017-06-19 DIAGNOSIS — T8744 Infection of amputation stump, left lower extremity: Secondary | ICD-10-CM | POA: Diagnosis present

## 2017-06-19 DIAGNOSIS — M6281 Muscle weakness (generalized): Secondary | ICD-10-CM | POA: Diagnosis not present

## 2017-06-19 DIAGNOSIS — I517 Cardiomegaly: Secondary | ICD-10-CM | POA: Diagnosis not present

## 2017-06-19 DIAGNOSIS — T148XXA Other injury of unspecified body region, initial encounter: Secondary | ICD-10-CM | POA: Diagnosis not present

## 2017-06-19 DIAGNOSIS — M7989 Other specified soft tissue disorders: Secondary | ICD-10-CM | POA: Diagnosis not present

## 2017-06-19 DIAGNOSIS — E669 Obesity, unspecified: Secondary | ICD-10-CM | POA: Diagnosis present

## 2017-06-19 DIAGNOSIS — R278 Other lack of coordination: Secondary | ICD-10-CM | POA: Diagnosis not present

## 2017-06-19 DIAGNOSIS — S8992XA Unspecified injury of left lower leg, initial encounter: Secondary | ICD-10-CM | POA: Diagnosis not present

## 2017-06-19 DIAGNOSIS — S81839A Puncture wound without foreign body, unspecified lower leg, initial encounter: Secondary | ICD-10-CM | POA: Diagnosis not present

## 2017-06-19 DIAGNOSIS — R509 Fever, unspecified: Secondary | ICD-10-CM | POA: Diagnosis not present

## 2017-06-19 DIAGNOSIS — R2689 Other abnormalities of gait and mobility: Secondary | ICD-10-CM | POA: Diagnosis not present

## 2017-06-19 DIAGNOSIS — M25561 Pain in right knee: Secondary | ICD-10-CM | POA: Diagnosis not present

## 2017-06-19 LAB — BASIC METABOLIC PANEL
ANION GAP: 12 (ref 5–15)
BUN: 21 mg/dL — ABNORMAL HIGH (ref 6–20)
CALCIUM: 9.2 mg/dL (ref 8.9–10.3)
CO2: 27 mmol/L (ref 22–32)
Chloride: 100 mmol/L — ABNORMAL LOW (ref 101–111)
Creatinine, Ser: 1.22 mg/dL — ABNORMAL HIGH (ref 0.44–1.00)
GFR calc Af Amer: 53 mL/min — ABNORMAL LOW (ref >60)
GFR, EST NON AFRICAN AMERICAN: 46 mL/min — AB (ref >60)
GLUCOSE: 99 mg/dL (ref 65–99)
POTASSIUM: 3.6 mmol/L (ref 3.5–5.1)
Sodium: 139 mmol/L (ref 135–145)

## 2017-06-19 LAB — CBC WITH DIFFERENTIAL/PLATELET
Basophils Absolute: 0 10*3/uL (ref 0.0–0.1)
Basophils Relative: 0 %
EOS ABS: 0.4 10*3/uL (ref 0.0–0.7)
EOS PCT: 3 %
HCT: 38.3 % (ref 36.0–46.0)
Hemoglobin: 11.8 g/dL — ABNORMAL LOW (ref 12.0–15.0)
LYMPHS ABS: 3.7 10*3/uL (ref 0.7–4.0)
Lymphocytes Relative: 28 %
MCH: 27.2 pg (ref 26.0–34.0)
MCHC: 30.8 g/dL (ref 30.0–36.0)
MCV: 88.2 fL (ref 78.0–100.0)
MONO ABS: 1.1 10*3/uL — AB (ref 0.1–1.0)
MONOS PCT: 8 %
Neutro Abs: 8.3 10*3/uL — ABNORMAL HIGH (ref 1.7–7.7)
Neutrophils Relative %: 61 %
PLATELETS: 367 10*3/uL (ref 150–400)
RBC: 4.34 MIL/uL (ref 3.87–5.11)
RDW: 16.8 % — ABNORMAL HIGH (ref 11.5–15.5)
WBC: 13.5 10*3/uL — ABNORMAL HIGH (ref 4.0–10.5)

## 2017-06-19 LAB — I-STAT CG4 LACTIC ACID, ED: LACTIC ACID, VENOUS: 1.28 mmol/L (ref 0.5–1.9)

## 2017-06-19 MED ORDER — LORATADINE 10 MG PO TABS
10.0000 mg | ORAL_TABLET | Freq: Every day | ORAL | Status: DC
  Administered 2017-06-20 – 2017-06-23 (×4): 10 mg via ORAL
  Filled 2017-06-19 (×4): qty 1

## 2017-06-19 MED ORDER — ASPIRIN EC 81 MG PO TBEC
81.0000 mg | DELAYED_RELEASE_TABLET | Freq: Every day | ORAL | Status: DC
  Administered 2017-06-20 – 2017-06-23 (×4): 81 mg via ORAL
  Filled 2017-06-19 (×4): qty 1

## 2017-06-19 MED ORDER — ACETAMINOPHEN 325 MG PO TABS: 650.0000 mg | ORAL_TABLET | Freq: Four times a day (QID) | ORAL | Status: DC | PRN

## 2017-06-19 MED ORDER — VANCOMYCIN HCL 10 G IV SOLR
2500.0000 mg | Freq: Once | INTRAVENOUS | Status: AC
  Administered 2017-06-19: 2500 mg via INTRAVENOUS
  Filled 2017-06-19: qty 500

## 2017-06-19 MED ORDER — GABAPENTIN 100 MG PO CAPS
100.0000 mg | ORAL_CAPSULE | Freq: Every day | ORAL | Status: DC
  Administered 2017-06-20 – 2017-06-22 (×4): 100 mg via ORAL
  Filled 2017-06-19 (×4): qty 1

## 2017-06-19 MED ORDER — BUPROPION HCL 100 MG PO TABS
50.0000 mg | ORAL_TABLET | Freq: Every day | ORAL | Status: DC
  Administered 2017-06-20 – 2017-06-23 (×4): 50 mg via ORAL
  Filled 2017-06-19 (×4): qty 0.5

## 2017-06-19 MED ORDER — ADULT MULTIVITAMIN W/MINERALS CH
1.0000 | ORAL_TABLET | Freq: Every day | ORAL | Status: DC
  Administered 2017-06-20 – 2017-06-23 (×4): 1 via ORAL
  Filled 2017-06-19 (×4): qty 1

## 2017-06-19 MED ORDER — VANCOMYCIN HCL IN DEXTROSE 1-5 GM/200ML-% IV SOLN
1000.0000 mg | Freq: Once | INTRAVENOUS | Status: DC
  Filled 2017-06-19: qty 200

## 2017-06-19 MED ORDER — HYDROCODONE-ACETAMINOPHEN 5-325 MG PO TABS
1.0000 | ORAL_TABLET | Freq: Four times a day (QID) | ORAL | Status: DC | PRN
  Administered 2017-06-20 – 2017-06-21 (×4): 2 via ORAL
  Filled 2017-06-19 (×4): qty 2

## 2017-06-19 MED ORDER — DOCUSATE SODIUM 50 MG PO CAPS
50.0000 mg | ORAL_CAPSULE | Freq: Every day | ORAL | Status: DC | PRN
  Filled 2017-06-19: qty 1

## 2017-06-19 MED ORDER — HYDROCODONE-ACETAMINOPHEN 7.5-325 MG PO TABS: 1.0000 | ORAL_TABLET | Freq: Four times a day (QID) | ORAL | Status: DC | PRN

## 2017-06-19 MED ORDER — PIPERACILLIN-TAZOBACTAM 3.375 G IVPB 30 MIN
3.3750 g | Freq: Once | INTRAVENOUS | Status: AC
  Administered 2017-06-19: 3.375 g via INTRAVENOUS
  Filled 2017-06-19: qty 50

## 2017-06-19 MED ORDER — SODIUM CHLORIDE 0.9 % IV BOLUS
1000.0000 mL | Freq: Once | INTRAVENOUS | Status: AC
  Administered 2017-06-19: 1000 mL via INTRAVENOUS

## 2017-06-19 MED ORDER — ONDANSETRON HCL 4 MG PO TABS: 4.0000 mg | ORAL_TABLET | Freq: Four times a day (QID) | ORAL | Status: DC | PRN

## 2017-06-19 MED ORDER — SERTRALINE HCL 50 MG PO TABS
50.0000 mg | ORAL_TABLET | Freq: Every day | ORAL | Status: DC
  Administered 2017-06-20 – 2017-06-23 (×4): 50 mg via ORAL
  Filled 2017-06-19 (×4): qty 1

## 2017-06-19 MED ORDER — FENOFIBRATE 160 MG PO TABS
160.0000 mg | ORAL_TABLET | Freq: Every day | ORAL | Status: DC
  Administered 2017-06-20 – 2017-06-23 (×4): 160 mg via ORAL
  Filled 2017-06-19 (×5): qty 1

## 2017-06-19 MED ORDER — TRAZODONE HCL 50 MG PO TABS
150.0000 mg | ORAL_TABLET | Freq: Every day | ORAL | Status: DC
  Administered 2017-06-20 – 2017-06-22 (×4): 150 mg via ORAL
  Filled 2017-06-19 (×4): qty 1

## 2017-06-19 MED ORDER — VALSARTAN-HYDROCHLOROTHIAZIDE 160-12.5 MG PO TABS: 1.0000 | ORAL_TABLET | Freq: Every day | ORAL | Status: DC

## 2017-06-19 MED ORDER — ENOXAPARIN SODIUM 60 MG/0.6ML ~~LOC~~ SOLN
60.0000 mg | Freq: Every day | SUBCUTANEOUS | Status: DC
  Administered 2017-06-20 – 2017-06-23 (×3): 60 mg via SUBCUTANEOUS
  Filled 2017-06-19 (×4): qty 0.6

## 2017-06-19 MED ORDER — DICLOFENAC SODIUM 1 % TD GEL
2.0000 g | Freq: Three times a day (TID) | TRANSDERMAL | Status: DC
  Administered 2017-06-20 – 2017-06-22 (×6): 2 g via TOPICAL
  Filled 2017-06-19: qty 100

## 2017-06-19 MED ORDER — ACETAMINOPHEN 650 MG RE SUPP: 650.0000 mg | Freq: Four times a day (QID) | RECTAL | Status: DC | PRN

## 2017-06-19 MED ORDER — ONDANSETRON HCL 4 MG/2ML IJ SOLN: 4.0000 mg | Freq: Four times a day (QID) | INTRAMUSCULAR | Status: DC | PRN

## 2017-06-19 NOTE — Progress Notes (Signed)
A consult was received from an ED physician for zosyn and vancomycin per pharmacy dosing.  The patient's profile has been reviewed for ht/wt/allergies/indication/available labs.   A one time order has been placed for Zosyn 3.375 Gm and Vancomycin 2500 mg.  Further antibiotics/pharmacy consults should be ordered by admitting physician if indicated.                       Thank you, Dorrene German 06/19/2017  9:32 PM

## 2017-06-19 NOTE — ED Notes (Signed)
Patient transported to X-ray 

## 2017-06-19 NOTE — H&P (Addendum)
History and Physical    Julia West ZOX:[id number] DOB: [birth year] DOA: 06/19/2017  PCP: [name], MD  Patient coming from: Home  I have personally briefly reviewed patient's old medical records in [place]  Chief Complaint: Stump infection  HPI: Julia West is a 62 y.o. female with medical history significant of HTN, amputation of L foot BKA.  Patient is not diabetic.  Patient's post-op course from amputation 10/23/89 has been complicated by wound dehissance and infection.  This is followed by both Dr. Sharol Given who preformed the surgery as well as Dr. Marla Roe with plastic surgery / wound care.  Wound has been looking worse the past couple of days, and today wound care RN recommended that she go to ED.  Patient has had low grade fevers today.  Redness surrounding wound which has been spreading for past couple of days.   ED Course: Patient put on zosyn / vanc by EDP.  Dr. Marla Roe will see patient in AM.   Review of Systems: As per HPI otherwise 10 point review of systems negative.   Past Medical History:  Diagnosis Date  . Acute myocardial infarction    with a ruptured plaque in the circumflex in 2003  . Anxiety   . Cancer ([place])    basal cell face  . Complication of anesthesia    states low O2 sats post-op 11/13, always slow to awaken  . Coronary artery disease   . Degenerative joint disease   . Dyspnea    with activity- car to house - steps  . Heart murmur    "not to be concerned"  . HTN (hypertension)   . Hyperlipidemia LDL goal <70   . Internal and external hemorrhoids without complication   . Lymphedema    right side of body  . Lymphedema of arm    right  . Morbid obesity ([place])   . Surgical wound, non healing ABDOMINAL    has wound vac @ 125 mm Hg    Past Surgical History:  Procedure Laterality Date  . AMPUTATION Left 05/06/2017   Procedure: BELOW KNEE AMPUTATION;  Surgeon: Newt Minion, MD;  Location: [facility];  Service: Orthopedics;   Laterality: Left;  . APPLICATION OF WOUND VAC    . BOWEL RESECTION  02/07/2012   Procedure: SMALL BOWEL RESECTION;  Surgeon: Gayland Curry, MD,FACS;  Location: [facility];  Service: General;;  . CARDIOVASCULAR STRESS TEST  01-17-2012  DR HOCHREIN   LOW RISK NUCLEAR TEST  . CESAREAN SECTION     x 4 in remote past  . CHOLECYSTECTOMY    . CORONARY ARTERY BYPASS GRAFT  2003   by Dr. Servando Snare. LIMA to the LAD, free RIMA to the circumflex. Stress perfusion study December 2009 with no high-risk areas of ischemia. She has a well-preserved ejection fraction  . HYSTEROSCOPY W/D&C N/A 05/14/2014   Procedure: DILATATION AND CURETTAGE (no specimen); HYSTEROSCOPY;  Surgeon: Jonnie Kind, MD;  [facility]: [facility];  Service: Gynecology;  Laterality: N/A;  . INCISION AND DRAINAGE OF WOUND  04/17/2012   Procedure: IRRIGATION AND DEBRIDEMENT WOUND;  Surgeon: Theodoro Kos, DO;  Location: [facility];  Service: Plastics;  Laterality: N/A;  OF ABDOMINAL WOUND, SURGICAL PREP AND PLACEMENT OF VAC, REMOVAL FOREHEAD SKIN LESION  . INCISION AND DRAINAGE OF WOUND N/A 04/24/2012   Procedure: IRRIGATION AND DEBRIDEMENT WOUND;  Surgeon: Theodoro Kos, DO;  Location: [facility];  Service: Plastics;  Laterality: N/A;  I & D  ABDOMINAL WOUND WITH VAC AND ACELL  . INCISION AND DRAINAGE OF WOUND N/A 05/01/2012   Procedure: IRRIGATION AND DEBRIDEMENT WOUND;  Surgeon: Theodoro Kos, DO;  Location: [facility];  Service: Plastics;  Laterality: N/A;  WITH SURGICAL PREP AND PLACEMENT OF VAC  . INCISION AND DRAINAGE OF WOUND N/A 05/08/2012   Procedure: IRRIGATION AND DEBRIDEMENT OF ABD WOUND SURGICAL PREP AND PLACEMENT OF VAC ;  Surgeon: Theodoro Kos, DO;  Location: [facility];  Service: Plastics;  Laterality: N/A;  IRRIGATION AND DEBRIDEMENT OF ABD WOUND SURGICAL PREP AND PLACEMENT OF VAC   . INCISION AND DRAINAGE OF WOUND N/A 05/15/2012   Procedure: IRRIGATION AND DEBRIDEMENT OF  ABDOMINAL WOUND WITH POSSIBLE SURGICAL PREP AND PLACEMENT OF VAC;  Surgeon: Theodoro Kos, DO;  Location: [facility];  Service: Plastics;  Laterality: N/A;  . INCISION AND DRAINAGE OF WOUND N/A 05/22/2012   Procedure: IRRIGATION AND DEBRIDEMENT OF ABODOMINAL WOUND WITH  SURGICAL PREP AND VAC PLACEMENT;  Surgeon: Theodoro Kos, DO;  Location: [facility];  Service: Plastics;  Laterality: N/A;  . INCISION AND DRAINAGE OF WOUND N/A 06/28/2012   Procedure: IRRIGATION AND DEBRIDEMENT OF ABDOMINAL ULCER SURGICAL PREP AND PLACEMENT OF ACELL AND VAC;  Surgeon: Theodoro Kos, DO;  Location: [facility];  Service: Plastics;  Laterality: N/A;  . INCISIONAL HERNIA REPAIR  02/07/2012   Procedure: HERNIA REPAIR INCISIONAL;  Surgeon: Gayland Curry, MD,FACS;  Location: [facility];  Service: General;;  Open, Primary repair, strangulated Incisional hernia.  Marland Kitchen LESION REMOVAL  04/17/2012   Procedure: LESION REMOVAL;  Surgeon: Theodoro Kos, DO;  Location: [facility];  Service: Plastics;  Laterality: N/A;   CENTER OF FOREHEAD, REMOVAL FORHEAD SKIN LESION  . ORIF TOE FRACTURE Left 02/09/2017   Procedure: EXCISION TALAR HEAD, INTERNAL FIXATION MEDIAL COLUMN LEFT FOOT;  Surgeon: Newt Minion, MD;  Location: [facility];  Service: Orthopedics;  Laterality: Left;  . POLYPECTOMY N/A 05/14/2014   Procedure: ENDOMETRIAL POLYPECTOMY;  Surgeon: Jonnie Kind, MD;  Location: [facility];  Service: Gynecology;  Laterality: N/A;     reports that she quit smoking about 5 years ago. Her smoking use included cigarettes. She has a 25.00 pack-year smoking history. She has never used smokeless tobacco. She reports that she does not drink alcohol or use drugs.  Allergies  Allergen Reactions  . Ace Inhibitors Cough    Family History  Problem Relation Age of Onset  . Heart attack Mother   . Hypertension Sister   . Diabetes Brother   . Birth defects Son        heart defect     Prior  to Admission medications   Medication Sig Start Date End Date Taking? Authorizing Provider  aspirin 81 MG tablet Take 81 mg by mouth daily.     [provider]  buPROPion (WELLBUTRIN) 100 MG tablet Take 50 mg by mouth daily.    [provider]  docusate sodium (COLACE) 50 MG capsule Take 50 mg by mouth daily as needed for mild constipation.    [provider]  fenofibrate 160 MG tablet TAKE 1 TABLET BY MOUTH  DAILY FOR CHOLESTEROL AND  TRIGLYCERIDES Patient taking differently: Take 160 mg by mouth daily.  12/10/16   [name], MD  furosemide (LASIX) 20 MG tablet TAKE 1 TABLET BY MOUTH  DAILY Patient taking differently: Take 20 mg by mouth daily 08/04/16   [name], MD  HYDROcodone-acetaminophen (NORCO/VICODIN)  5-325 MG tablet Take 1-2 tablets by mouth every 6 (six) hours as needed for moderate pain or severe pain ((score 4 to 6)). 05/11/17   Newt Minion, MD  nitroGLYCERIN (NITROSTAT) 0.4 MG SL tablet Place 1 tablet (0.4 mg total) under the tongue every 5 (five) minutes x 3 doses as needed for chest pain. If pain persist after call 911 01/08/16 05/02/17  [name], MD  sertraline (ZOLOFT) 50 MG tablet TAKE 1 TABLET BY MOUTH EVERY DAY 05/23/17   [name], MD  valsartan-hydrochlorothiazide (DIOVAN-HCT) 160-12.5 MG tablet Take 1 tablet by mouth daily. 12/10/16   [name], MD    Physical Exam: Vitals:   06/19/17 1417 06/19/17 1420 06/19/17 2123  BP:  (!) 146/46 (!) 137/53  Pulse:  64 72  Resp:  18 17  Temp:  98.7 F (37.1 C)   TempSrc:  Oral   SpO2:  97% 93%  Weight: 119.3 kg (263 lb)    Height: 5\' 6"  (1.676 m)      Constitutional: NAD, calm, comfortable Eyes: PERRL, lids and conjunctivae normal ENMT: Mucous membranes are moist. Posterior pharynx clear of any exudate or lesions.Normal dentition.  Neck: normal, supple, no masses, no thyromegaly Respiratory: clear to auscultation bilaterally, no wheezing, no crackles. Normal respiratory  effort. No accessory muscle use.  Cardiovascular: Regular rate and rhythm, no murmurs / rubs / gallops. No extremity edema. 2+ pedal pulses. No carotid bruits.  Abdomen: no tenderness, no masses palpated. No hepatosplenomegaly. Bowel sounds positive.  Musculoskeletal: no clubbing / cyanosis. No joint deformity upper and lower extremities. Good ROM, no contractures. Normal muscle tone.  Skin:    Neurologic: CN 2-12 grossly intact. Sensation intact, DTR normal. Strength 5/5 in all 4.  Psychiatric: Normal judgment and insight. Alert and oriented x 3. Normal mood.    Labs on Admission: I have personally reviewed following labs and imaging studies  CBC: Recent Labs  Lab 06/19/17 1855  WBC 13.5*  NEUTROABS 8.3*  HGB 11.8*  HCT 38.3  MCV 88.2  PLT 517   Basic Metabolic Panel: Recent Labs  Lab 06/19/17 1855  NA 139  K 3.6  CL 100*  CO2 27  GLUCOSE 99  BUN 21*  CREATININE 1.22*  CALCIUM 9.2   GFR: Estimated Creatinine Clearance: 61.3 mL/min (A) (by C-G formula based on SCr of 1.22 mg/dL (H)). Liver Function Tests: No results for input(s): AST, ALT, ALKPHOS, BILITOT, PROT, ALBUMIN in the last 168 hours. No results for input(s): LIPASE, AMYLASE in the last 168 hours. No results for input(s): AMMONIA in the last 168 hours. Coagulation Profile: No results for input(s): INR, PROTIME in the last 168 hours. Cardiac Enzymes: No results for input(s): CKTOTAL, CKMB, CKMBINDEX, TROPONINI in the last 168 hours. BNP (last 3 results) No results for input(s): PROBNP in the last 8760 hours. HbA1C: No results for input(s): HGBA1C in the last 72 hours. CBG: No results for input(s): GLUCAP in the last 168 hours. Lipid Profile: No results for input(s): CHOL, HDL, LDLCALC, TRIG, CHOLHDL, LDLDIRECT in the last 72 hours. Thyroid Function Tests: No results for input(s): TSH, T4TOTAL, FREET4, T3FREE, THYROIDAB in the last 72 hours. Anemia Panel: No results for input(s): VITAMINB12, FOLATE,  FERRITIN, TIBC, IRON, RETICCTPCT in the last 72 hours. Urine analysis:    Component Value Date/Time   COLORURINE STRAW (A) 05/07/2017 1751   APPEARANCEUR CLEAR 05/07/2017 1751   APPEARANCEUR Clear 12/10/2016 1203   LABSPEC 1.009 05/07/2017 1751   PHURINE 5.0 05/07/2017 1751  GLUCOSEU NEGATIVE 05/07/2017 1751   HGBUR NEGATIVE 05/07/2017 1751   BILIRUBINUR NEGATIVE 05/07/2017 1751   BILIRUBINUR Negative 12/10/2016 [place] 05/07/2017 1751   PROTEINUR NEGATIVE 05/07/2017 1751   UROBILINOGEN 0.2 05/10/2014 1039   NITRITE NEGATIVE 05/07/2017 1751   LEUKOCYTESUR NEGATIVE 05/07/2017 1751   LEUKOCYTESUR Trace (A) 12/10/2016 1203    Radiological Exams on Admission: Dg Knee Complete 4 Views Left  Result Date: 06/19/2017 CLINICAL DATA:  Below the knee amputation in February. Worsening pain, redness and bleeding. EXAM: LEFT KNEE - COMPLETE 4+ VIEW COMPARISON:  None. FINDINGS: Uncomplicated amputation margins of the proximal diaphyses of the tibia and fibula. No unexpected air or gas in the soft tissues of the stump. No plain radiographic evidence of osteomyelitis. Chronic degenerative changes affect the knee joint. IMPRESSION: No unexpected finding in this patient status post below the knee amputation. Electronically Signed   By: Nelson Chimes M.D.   On: 06/19/2017 20:05    EKG: Independently reviewed.  Assessment/Plan Principal Problem:   Infection of amputation stump of left lower extremity (HCC) Active Problems:   Wound dehiscence, surgical, sequela   S/P BKA (below knee amputation) unilateral, left (HCC)   Acquired absence of left leg below knee ([place])    1. Infection of LLE stump - 1. Will leave on zosyn / vanc for the moment 2. BCx pending 3. Got 1L IVF bolus 4. Cont neurontin and norco 5. Wound care consult 6. Dr. Marla Roe coming to see patient in AM 2. HTN - 1. Holding Lasix 2. Will continue diovan-hctz in AM  DVT prophylaxis: Lovenox Code Status:  Full Family Communication: Daughter at bedside Disposition Plan: Home after admit Consults called: Dr. Marla Roe called by EDP Admission status: Admit to inpatient   Etta Quill DO [facility] Pager [phone]  If 7AM-7PM, please contact day team taking care of patient [email or web address] Password [facility]  06/19/2017, 10:54 PM

## 2017-06-19 NOTE — ED Triage Notes (Signed)
Left bka in Feb, has been going to wound care, now has increased pain, redness which is increasing,

## 2017-06-19 NOTE — ED Notes (Signed)
Hospitalist at bedside 

## 2017-06-19 NOTE — ED Provider Notes (Signed)
[facility] DEPT Provider Note   CSN: [id number] Arrival date & time: 06/19/17  1406     History   Chief Complaint Chief Complaint  Patient presents with  . Leg Pain    left    HPI Julia West is a 62 y.o. female.  Patient with hx pvd, htn, presents with poorly healing LLE BKA site. Surgery was 2/22 for non healing chronic foot wound/Charcot foot. Pt notes initially stump looked good, but in past few weeks, pain to stump, and now w spreading redness from wound, and new area of serosanguinous drainage along medial aspect of wound. States homecare/wound nurse outlined area of erythema this AM, and since it is mildly worse. Low grade fevers today.   The history is provided by the patient.  Leg Pain      Past Medical History:  Diagnosis Date  . Acute myocardial infarction    with a ruptured plaque in the circumflex in 2003  . Anxiety   . Cancer ([place])    basal cell face  . Complication of anesthesia    states low O2 sats post-op 11/13, always slow to awaken  . Coronary artery disease   . Degenerative joint disease   . Dyspnea    with activity- car to house - steps  . Heart murmur    "not to be concerned"  . HTN (hypertension)   . Hyperlipidemia LDL goal <70   . Internal and external hemorrhoids without complication   . Lymphedema    right side of body  . Lymphedema of arm    right  . Morbid obesity ([place])   . Surgical wound, non healing ABDOMINAL    has wound vac @ 125 mm Hg    Patient Active Problem List   Diagnosis Date Noted  . Acquired absence of left leg below knee ([place]) 05/19/2017  . Bradycardia 05/07/2017  . Depression 05/07/2017  . Lymphedema 05/07/2017  . S/P BKA (below knee amputation) unilateral, left ([place]) 05/06/2017  . Gangrene of left foot ([place])   . Wound dehiscence, surgical, sequela   . Charcot foot due to diabetes mellitus ([place]) 02/09/2017  . 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 Post-menopausal bleeding 04/15/2014  . Endometrial polyp 04/09/2014  . Ventral hernia 03/06/2012  . Open abdominal wall wound 03/06/2012  . Hyperlipemia 11/12/2008  . OBESITY, MORBID 11/12/2008  . Essential hypertension 11/12/2008  . CAD (coronary artery disease) 11/12/2008    Past Surgical History:  Procedure Laterality Date  . AMPUTATION Left 05/06/2017   Procedure: BELOW KNEE AMPUTATION;  Surgeon: Newt Minion, MD;  Location: [facility];  Service: Orthopedics;  Laterality: Left;  . APPLICATION OF WOUND VAC    . BOWEL RESECTION  02/07/2012   Procedure: SMALL BOWEL RESECTION;  Surgeon: Gayland Curry, MD,FACS;  Location: [facility];  Service: General;;  . CARDIOVASCULAR STRESS TEST  01-17-2012  DR HOCHREIN   LOW RISK NUCLEAR TEST  . CESAREAN SECTION     x 4 in remote past  . CHOLECYSTECTOMY    . CORONARY ARTERY BYPASS GRAFT  2003   by Dr. Servando Snare. LIMA to the LAD, free RIMA to the circumflex. Stress perfusion study December 2009 with no high-risk areas of ischemia. She has a well-preserved ejection fraction  . HYSTEROSCOPY W/D&C N/A 05/14/2014   Procedure: DILATATION AND CURETTAGE (no specimen); HYSTEROSCOPY;  Surgeon: Jonnie Kind, MD;  [facility];  Service: Gynecology;  Laterality:  N/A;  . INCISION AND DRAINAGE OF WOUND  04/17/2012   Procedure: IRRIGATION AND DEBRIDEMENT WOUND;  Surgeon: Theodoro Kos, DO;  Location: [facility];  Service: Plastics;  Laterality: N/A;  OF ABDOMINAL WOUND, SURGICAL PREP AND PLACEMENT OF VAC, REMOVAL FOREHEAD SKIN LESION  . INCISION AND DRAINAGE OF WOUND N/A 04/24/2012   Procedure: IRRIGATION AND DEBRIDEMENT WOUND;  Surgeon: Theodoro Kos, DO;  Location: [facility];  Service: Plastics;  Laterality: N/A;  I & D ABDOMINAL WOUND WITH VAC AND ACELL  . INCISION AND DRAINAGE OF WOUND N/A 05/01/2012   Procedure: IRRIGATION AND DEBRIDEMENT WOUND;  Surgeon: Theodoro Kos, DO;  Location: [facility];  Service:  Plastics;  Laterality: N/A;  WITH SURGICAL PREP AND PLACEMENT OF VAC  . INCISION AND DRAINAGE OF WOUND N/A 05/08/2012   Procedure: IRRIGATION AND DEBRIDEMENT OF ABD WOUND SURGICAL PREP AND PLACEMENT OF VAC ;  Surgeon: Theodoro Kos, DO;  Location: [facility];  Service: Plastics;  Laterality: N/A;  IRRIGATION AND DEBRIDEMENT OF ABD WOUND SURGICAL PREP AND PLACEMENT OF VAC   . INCISION AND DRAINAGE OF WOUND N/A 05/15/2012   Procedure: IRRIGATION AND DEBRIDEMENT OF ABDOMINAL WOUND WITH POSSIBLE SURGICAL PREP AND PLACEMENT OF VAC;  Surgeon: Theodoro Kos, DO;  Location: [facility];  Service: Plastics;  Laterality: N/A;  . INCISION AND DRAINAGE OF WOUND N/A 05/22/2012   Procedure: IRRIGATION AND DEBRIDEMENT OF ABODOMINAL WOUND WITH  SURGICAL PREP AND VAC PLACEMENT;  Surgeon: Theodoro Kos, DO;  Location: [facility];  Service: Plastics;  Laterality: N/A;  . INCISION AND DRAINAGE OF WOUND N/A 06/28/2012   Procedure: IRRIGATION AND DEBRIDEMENT OF ABDOMINAL ULCER SURGICAL PREP AND PLACEMENT OF ACELL AND VAC;  Surgeon: Theodoro Kos, DO;  Location: [facility];  Service: Plastics;  Laterality: N/A;  . INCISIONAL HERNIA REPAIR  02/07/2012   Procedure: HERNIA REPAIR INCISIONAL;  Surgeon: Gayland Curry, MD,FACS;  Location: [facility];  Service: General;;  Open, Primary repair, strangulated Incisional hernia.  Marland Kitchen LESION REMOVAL  04/17/2012   Procedure: LESION REMOVAL;  Surgeon: Theodoro Kos, DO;  Location: [facility];  Service: Plastics;  Laterality: N/A;   CENTER OF FOREHEAD, REMOVAL FORHEAD SKIN LESION  . ORIF TOE FRACTURE Left 02/09/2017   Procedure: EXCISION TALAR HEAD, INTERNAL FIXATION MEDIAL COLUMN LEFT FOOT;  Surgeon: Newt Minion, MD;  Location: [facility];  Service: Orthopedics;  Laterality: Left;  . POLYPECTOMY N/A 05/14/2014   Procedure: ENDOMETRIAL POLYPECTOMY;  Surgeon: Jonnie Kind, MD;  Location[facility];  Service: Gynecology;   Laterality: N/A;     OB History   None      Home Medications    Prior to Admission medications   Medication Sig Start Date End Date Taking? Authorizing Provider  aspirin 81 MG tablet Take 81 mg by mouth daily.     [provider]  buPROPion (WELLBUTRIN) 100 MG tablet Take 50 mg by mouth daily.    [provider]  docusate sodium (COLACE) 50 MG capsule Take 50 mg by mouth daily as needed for mild constipation.    [provider]  fenofibrate 160 MG tablet TAKE 1 TABLET BY MOUTH  DAILY FOR CHOLESTEROL AND  TRIGLYCERIDES Patient taking differently: Take 160 mg by mouth daily.  12/10/16   [name], MD  furosemide (LASIX) 20 MG tablet TAKE 1 TABLET BY MOUTH  DAILY Patient taking differently: Take 20 mg by mouth daily 08/04/16  [name], MD  HYDROcodone-acetaminophen (NORCO/VICODIN) 5-325 MG tablet Take 1-2 tablets by mouth every 6 (six) hours as needed for moderate pain or severe pain ((score 4 to 6)). 05/11/17   Newt Minion, MD  nitroGLYCERIN (NITROSTAT) 0.4 MG SL tablet Place 1 tablet (0.4 mg total) under the tongue every 5 (five) minutes x 3 doses as needed for chest pain. If pain persist after call 911 01/08/16 05/02/17  [name], MD  sertraline (ZOLOFT) 50 MG tablet TAKE 1 TABLET BY MOUTH EVERY DAY 05/23/17   [name], MD  valsartan-hydrochlorothiazide (DIOVAN-HCT) 160-12.5 MG tablet Take 1 tablet by mouth daily. 12/10/16   [name], MD    Family History Family History  Problem Relation Age of Onset  . Heart attack Mother   . Hypertension Sister   . Diabetes Brother   . Birth defects Son        heart defect    Social History Social History   Tobacco Use  . Smoking status: Former Smoker    Packs/day: 1.00    Years: 25.00    Pack years: 25.00    Types: Cigarettes    Last attempt to quit: 11/17/2011    Years since quitting: 5.5  . Smokeless tobacco: Never Used  Substance Use Topics  . Alcohol use: No  . Drug use: No       Allergies   Ace inhibitors   Review of Systems Review of Systems  Constitutional: Positive for fever.  HENT: Negative for sore throat.   Eyes: Negative for redness.  Respiratory: Negative for cough.   Cardiovascular: Negative for chest pain.  Gastrointestinal: Negative for abdominal pain.  Genitourinary: Negative for flank pain.  Musculoskeletal: Negative for back pain.  Skin: Positive for wound.  Neurological: Negative for headaches.  Hematological: Does not bruise/bleed easily.  Psychiatric/Behavioral: Negative for confusion.     Physical Exam Updated Vital Signs BP (!) 146/46 (BP Location: Left Arm)   Pulse 64   Temp 98.7 F (37.1 C) (Oral)   Resp 18   Ht 1.676 m (5\' 6" )   Wt 119.3 kg (263 lb)   SpO2 97%   BMI 42.45 kg/m   Physical Exam  Constitutional: She appears well-developed and well-nourished. No distress.  HENT:  Head: Atraumatic.  Eyes: Conjunctivae are normal. No scleral icterus.  Neck: Neck supple. No tracheal deviation present.  Cardiovascular: Normal rate, regular rhythm, normal heart sounds and intact distal pulses.  Pulmonary/Chest: Effort normal and breath sounds normal. No respiratory distress.  Abdominal: Normal appearance. She exhibits no distension. There is no tenderness.  Musculoskeletal:  Left BKA. Area eschar centrally, approximately 2 by 4 cm. Medial there is open area approximately 5 mm draining serosanguinous material (pt notes homecare rn probed wound and it tracked several cm underneath skin). Outline of erythema noted - currently erythema extends approximately 2-3 cm beyond that line. +increased warmth to skin of area.   Neurological: She is alert.  Skin: Skin is warm and dry. No rash noted. She is not diaphoretic.  Psychiatric: She has a normal mood and affect.  Nursing note and vitals reviewed.      ED Treatments / Results  Labs (all labs ordered are listed, but only abnormal results are displayed) Results for orders  placed or performed during the hospital encounter of 06/19/17  CBC with Differential/Platelet  Result Value Ref Range   WBC 13.5 (H) 4.0 - 10.5 K/uL   RBC 4.34 3.87 - 5.11 MIL/uL   Hemoglobin 11.8 (L)  12.0 - 15.0 g/dL   HCT 38.3 36.0 - 46.0 %   MCV 88.2 78.0 - 100.0 fL   MCH 27.2 26.0 - 34.0 pg   MCHC 30.8 30.0 - 36.0 g/dL   RDW 16.8 (H) 11.5 - 15.5 %   Platelets 367 150 - 400 K/uL   Neutrophils Relative % 61 %   Neutro Abs 8.3 (H) 1.7 - 7.7 K/uL   Lymphocytes Relative 28 %   Lymphs Abs 3.7 0.7 - 4.0 K/uL   Monocytes Relative 8 %   Monocytes Absolute 1.1 (H) 0.1 - 1.0 K/uL   Eosinophils Relative 3 %   Eosinophils Absolute 0.4 0.0 - 0.7 K/uL   Basophils Relative 0 %   Basophils Absolute 0.0 0.0 - 0.1 K/uL  Basic metabolic panel  Result Value Ref Range   Sodium 139 135 - 145 mmol/L   Potassium 3.6 3.5 - 5.1 mmol/L   Chloride 100 (L) 101 - 111 mmol/L   CO2 27 22 - 32 mmol/L   Glucose, Bld 99 65 - 99 mg/dL   BUN 21 (H) 6 - 20 mg/dL   Creatinine, Ser 1.22 (H) 0.44 - 1.00 mg/dL   Calcium 9.2 8.9 - 10.3 mg/dL   GFR calc non Af Amer 46 (L) >60 mL/min   GFR calc Af Amer 53 (L) >60 mL/min   Anion gap 12 5 - 15  I-Stat CG4 Lactic Acid, ED  Result Value Ref Range   Lactic Acid, Venous 1.28 0.5 - 1.9 mmol/L   EKG None  Radiology No results found.  Procedures Procedures (including critical care time)  Medications Ordered in ED Medications - No data to display   Initial Impression / Assessment and Plan / ED Course  I have reviewed the triage vital signs and the nursing notes.  Pertinent labs & imaging results that were available during my care of the patient were reviewed by me and considered in my medical decision making (see chart for details).  Iv ns. Labs.   Pts surgeon/wound care specialist notified - discussed care with Dr Marla Roe - she will see in hospital in AM, admit to medicine.   Labs pending.   Will order abx.  Reviewed nursing notes and prior charts  for additional history.   Iv abx given.  Iv ns bolus.  hospitalists consulted for admission.    Final Clinical Impressions(s) / ED Diagnoses   Final diagnoses:  None    ED Discharge Orders    None       Lajean Saver, MD 06/19/17 2145

## 2017-06-20 ENCOUNTER — Other Ambulatory Visit: Payer: Self-pay

## 2017-06-20 ENCOUNTER — Inpatient Hospital Stay (HOSPITAL_COMMUNITY): Payer: Medicare Other

## 2017-06-20 DIAGNOSIS — T8744 Infection of amputation stump, left lower extremity: Principal | ICD-10-CM

## 2017-06-20 DIAGNOSIS — I517 Cardiomegaly: Secondary | ICD-10-CM

## 2017-06-20 LAB — ECHOCARDIOGRAM LIMITED
Height: 66 in
Weight: 4208 oz

## 2017-06-20 LAB — MRSA PCR SCREENING: MRSA by PCR: NEGATIVE

## 2017-06-20 MED ORDER — VANCOMYCIN HCL IN DEXTROSE 1-5 GM/200ML-% IV SOLN
1000.0000 mg | INTRAVENOUS | Status: DC
  Administered 2017-06-20 – 2017-06-22 (×3): 1000 mg via INTRAVENOUS
  Filled 2017-06-20 (×4): qty 200

## 2017-06-20 MED ORDER — COLLAGENASE 250 UNIT/GM EX OINT
TOPICAL_OINTMENT | Freq: Every day | CUTANEOUS | Status: DC
  Administered 2017-06-20 – 2017-06-21 (×2): via TOPICAL
  Filled 2017-06-20: qty 30

## 2017-06-20 MED ORDER — COLLAGENASE 250 UNIT/GM EX OINT
TOPICAL_OINTMENT | Freq: Every day | CUTANEOUS | Status: DC
  Filled 2017-06-20: qty 90

## 2017-06-20 MED ORDER — HYDROCHLOROTHIAZIDE 12.5 MG PO CAPS
12.5000 mg | ORAL_CAPSULE | Freq: Every day | ORAL | Status: DC
  Administered 2017-06-20 – 2017-06-23 (×3): 12.5 mg via ORAL
  Filled 2017-06-20 (×4): qty 1

## 2017-06-20 MED ORDER — PIPERACILLIN-TAZOBACTAM 3.375 G IVPB
3.3750 g | Freq: Three times a day (TID) | INTRAVENOUS | Status: DC
  Administered 2017-06-20 – 2017-06-23 (×11): 3.375 g via INTRAVENOUS
  Filled 2017-06-20 (×11): qty 50

## 2017-06-20 MED ORDER — IRBESARTAN 150 MG PO TABS
150.0000 mg | ORAL_TABLET | Freq: Every day | ORAL | Status: DC
  Administered 2017-06-20 – 2017-06-23 (×3): 150 mg via ORAL
  Filled 2017-06-20 (×4): qty 1

## 2017-06-20 NOTE — Progress Notes (Signed)
Pharmacy Antibiotic Note  CORTANA VANDERFORD is a 62 y.o. female admitted on 06/19/2017 with wound infection.  Pharmacy has been consulted for zosyn and vancomycin dosing.  Plan: Zosyn 3.375g IV q8h (4 hour infusion).  Vancomycin 2500 mg x1 then 1 Gm IV q24h for est AUC = 460 Goal AUC = 400-500 Daily Scr F/u scr/cultures  Height: 5\' 6"  (167.6 cm) Weight: 263 lb (119.3 kg) IBW/kg (Calculated) : 59.3  Temp (24hrs), Avg:98.7 F (37.1 C), Min:98.7 F (37.1 C), Max:98.7 F (37.1 C)  Recent Labs  Lab 06/19/17 1855 06/19/17 2029  WBC 13.5*  --   CREATININE 1.22*  --   LATICACIDVEN  --  1.28    Estimated Creatinine Clearance: 61.3 mL/min (A) (by C-G formula based on SCr of 1.22 mg/dL (H)).    Allergies  Allergen Reactions  . Ace Inhibitors Cough    Antimicrobials this admission: 4/8 zosyn >>  4/8 vancomycin >>   Dose adjustments this admission:   Microbiology results:  BCx:   UCx:    Sputum:    MRSA PCR:   Thank you for allowing pharmacy to be a part of this patient's care.  Dorrene German 06/20/2017 12:02 AM

## 2017-06-20 NOTE — Consult Note (Signed)
ORTHOPAEDIC CONSULTATION  REQUESTING PHYSICIAN: Purohit, Konrad Dolores, MD  Chief Complaint: Redness and black eschar left transtibial amputation.  HPI: Julia West is a 62 y.o. female who presents with a thin black eschar over the transtibial amputation residual limb she did have cellulitis she was referred from [facility].  She was started on IV antibiotics and she states that the cellulitis has resolved significantly after starting IV antibiotics.  Past Medical History:  Diagnosis Date  . Acute myocardial infarction    with a ruptured plaque in the circumflex in 2003  . Anxiety   . Cancer ([place])    basal cell face  . Complication of anesthesia    states low O2 sats post-op 11/13, always slow to awaken  . Coronary artery disease   . Degenerative joint disease   . Dyspnea    with activity- car to house - steps  . Heart murmur    "not to be concerned"  . HTN (hypertension)   . Hyperlipidemia LDL goal <70   . Internal and external hemorrhoids without complication   . Lymphedema    right side of body  . Lymphedema of arm    right  . Morbid obesity ([place])   . Surgical wound, non healing ABDOMINAL    has wound vac @ 125 mm Hg   Past Surgical History:  Procedure Laterality Date  . AMPUTATION Left 05/06/2017   Procedure: BELOW KNEE AMPUTATION;  Surgeon: Newt Minion, MD;  Location: [facility];  Service: Orthopedics;  Laterality: Left;  . APPLICATION OF WOUND VAC    . BOWEL RESECTION  02/07/2012   Procedure: SMALL BOWEL RESECTION;  Surgeon: Gayland Curry, MD,FACS;  Location: [facility];  Service: General;;  . CARDIOVASCULAR STRESS TEST  01-17-2012  DR HOCHREIN   LOW RISK NUCLEAR TEST  . CESAREAN SECTION     x 4 in remote past  . CHOLECYSTECTOMY    . CORONARY ARTERY BYPASS GRAFT  2003   by Dr. Servando Snare. LIMA to the LAD, free RIMA to the circumflex. Stress perfusion study December 2009 with no high-risk areas of ischemia. She has a well-preserved ejection fraction  .  HYSTEROSCOPY W/D&C N/A 05/14/2014   Procedure: DILATATION AND CURETTAGE (no specimen); HYSTEROSCOPY;  Surgeon: Jonnie Kind, MD;  Location: [facility];  Service: Gynecology;  Laterality: N/A;  . INCISION AND DRAINAGE OF WOUND  04/17/2012   Procedure: IRRIGATION AND DEBRIDEMENT WOUND;  Surgeon: Theodoro Kos, DO;  Location: [place];  Service: Plastics;  Laterality: N/A;  OF ABDOMINAL WOUND, SURGICAL PREP AND PLACEMENT OF VAC, REMOVAL FOREHEAD SKIN LESION  . INCISION AND DRAINAGE OF WOUND N/A 04/24/2012   Procedure: IRRIGATION AND DEBRIDEMENT WOUND;  Surgeon: Theodoro Kos, DO;  Location: [facility];  Service: Plastics;  Laterality: N/A;  I & D ABDOMINAL WOUND WITH VAC AND ACELL  . INCISION AND DRAINAGE OF WOUND N/A 05/01/2012   Procedure: IRRIGATION AND DEBRIDEMENT WOUND;  Surgeon: Theodoro Kos, DO;  Location: [facility];  Service: Plastics;  Laterality: N/A;  WITH SURGICAL PREP AND PLACEMENT OF VAC  . INCISION AND DRAINAGE OF WOUND N/A 05/08/2012   Procedure: IRRIGATION AND DEBRIDEMENT OF ABD WOUND SURGICAL PREP AND PLACEMENT OF VAC ;  Surgeon: Theodoro Kos, DO;  Location: [facility];  Service: Plastics;  Laterality: N/A;  IRRIGATION AND DEBRIDEMENT OF ABD WOUND SURGICAL PREP AND PLACEMENT OF VAC   . INCISION AND DRAINAGE OF WOUND N/A 05/15/2012  Procedure: IRRIGATION AND DEBRIDEMENT OF ABDOMINAL WOUND WITH POSSIBLE SURGICAL PREP AND PLACEMENT OF VAC;  Surgeon: Theodoro Kos, DO;  Location: [facility];  Service: Plastics;  Laterality: N/A;  . INCISION AND DRAINAGE OF WOUND N/A 05/22/2012   Procedure: IRRIGATION AND DEBRIDEMENT OF ABODOMINAL WOUND WITH  SURGICAL PREP AND VAC PLACEMENT;  Surgeon: Theodoro Kos, DO;  Location: [facility];  Service: Plastics;  Laterality: N/A;  . INCISION AND DRAINAGE OF WOUND N/A 06/28/2012   Procedure: IRRIGATION AND DEBRIDEMENT OF ABDOMINAL ULCER SURGICAL PREP AND PLACEMENT OF  ACELL AND VAC;  Surgeon: Theodoro Kos, DO;  Location: [facility];  Service: Plastics;  Laterality: N/A;  . INCISIONAL HERNIA REPAIR  02/07/2012   Procedure: HERNIA REPAIR INCISIONAL;  Surgeon: Gayland Curry, MD,FACS;  Location: [facility];  Service: General;;  Open, Primary repair, strangulated Incisional hernia.  Marland Kitchen LESION REMOVAL  04/17/2012   Procedure: LESION REMOVAL;  Surgeon: Theodoro Kos, DO;  Location: [facility];  Service: Plastics;  Laterality: N/A;   CENTER OF FOREHEAD, REMOVAL FORHEAD SKIN LESION  . ORIF TOE FRACTURE Left 02/09/2017   Procedure: EXCISION TALAR HEAD, INTERNAL FIXATION MEDIAL COLUMN LEFT FOOT;  Surgeon: Newt Minion, MD;  Location: [facility];  Service: Orthopedics;  Laterality: Left;  . POLYPECTOMY N/A 05/14/2014   Procedure: ENDOMETRIAL POLYPECTOMY;  Surgeon: Jonnie Kind, MD;  Location: [facility];  Service: Gynecology;  Laterality: N/A;   Social History   Socioeconomic History  . Marital status: Married    Spouse name: Not on file  . Number of children: 5  . Years of education: Not on file  . Highest education level: Not on file  Occupational History  . Occupation: Estate manager/land agent    Comment: In the [employer]  . Financial resource strain: Not on file  . Food insecurity:    Worry: Not on file    Inability: Not on file  . Transportation needs:    Medical: Not on file    Non-medical: Not on file  Tobacco Use  . Smoking status: Former Smoker    Packs/day: 1.00    Years: 25.00    Pack years: 25.00    Types: Cigarettes    Last attempt to quit: 11/17/2011    Years since quitting: 5.5  . Smokeless tobacco: Never Used  Substance and Sexual Activity  . Alcohol use: No  . Drug use: No  . Sexual activity: Yes    Birth control/protection: Post-menopausal  Lifestyle  . Physical activity:    Days per week: Not on file    Minutes per session: Not on file  . Stress: Not on file  Relationships  . Social connections:     Talks on phone: Not on file    Gets together: Not on file    Attends religious service: Not on file    Active member of club or organization: Not on file    Attends meetings of clubs or organizations: Not on file    Relationship status: Not on file  Other Topics Concern  . Not on file  Social History Narrative   Lives in [place] with her husband. 5 children. One of whom has pulmonary stenosis. Her only exercise is at work   Family History  Problem Relation Age of Onset  . Heart attack Mother   . Hypertension Sister   . Diabetes Brother   . Birth defects Son        heart  defect   - negative except otherwise stated in the family history section Allergies  Allergen Reactions  . Ace Inhibitors Cough   Prior to Admission medications   Medication Sig Start Date End Date Taking? Authorizing Provider  aspirin 81 MG tablet Take 81 mg by mouth daily.    Yes [provider]  diclofenac sodium (VOLTAREN) 1 % GEL Apply 2 g topically 3 (three) times daily.   Yes [provider]  fenofibrate 160 MG tablet TAKE 1 TABLET BY MOUTH  DAILY FOR CHOLESTEROL AND  TRIGLYCERIDES Patient taking differently: Take 160 mg by mouth daily.  12/10/16  Yes Stacks, Cletus Gash, MD  fexofenadine (ALLEGRA) 180 MG tablet Take 180 mg by mouth daily.   Yes [provider]  furosemide (LASIX) 20 MG tablet TAKE 1 TABLET BY MOUTH  DAILY Patient taking differently: Take 20 mg by mouth daily 08/04/16  Yes Stacks, Cletus Gash, MD  gabapentin (NEURONTIN) 100 MG capsule Take 100 mg by mouth at bedtime.   Yes [provider]  HYDROcodone-acetaminophen (NORCO) 7.5-325 MG tablet Take 1 tablet by mouth every 6 (six) hours as needed for moderate pain.   Yes [provider]  loratadine (CLARITIN) 10 MG tablet Take 10 mg by mouth daily.   Yes [provider]  Multiple Vitamins-Minerals (MULTIVITAMIN ADULT) TABS Take 1 tablet by mouth daily.   Yes [provider]  nitroGLYCERIN  (NITROSTAT) 0.4 MG SL tablet Place 1 tablet (0.4 mg total) under the tongue every 5 (five) minutes x 3 doses as needed for chest pain. If pain persist after call 911 01/08/16 06/19/17 Yes Hochrein, Jeneen Rinks, MD  sertraline (ZOLOFT) 50 MG tablet TAKE 1 TABLET BY MOUTH EVERY DAY 05/23/17  Yes Stacks, Cletus Gash, MD  silver sulfADIAZINE (SILVADENE) 1 % cream Apply 1 application topically daily.   Yes [provider]  traZODone (DESYREL) 150 MG tablet Take 150 mg by mouth at bedtime.   Yes [provider]  valsartan-hydrochlorothiazide (DIOVAN-HCT) 160-12.5 MG tablet Take 1 tablet by mouth daily. 12/10/16  Yes [name], MD  HYDROcodone-acetaminophen (NORCO/VICODIN) 5-325 MG tablet Take 1-2 tablets by mouth every 6 (six) hours as needed for moderate pain or severe pain ((score 4 to 6)). Patient not taking: Reported on 06/19/2017 05/11/17   Newt Minion, MD   Dg Knee Complete 4 Views Left  Result Date: 06/19/2017 CLINICAL DATA:  Below the knee amputation in February. Worsening pain, redness and bleeding. EXAM: LEFT KNEE - COMPLETE 4+ VIEW COMPARISON:  None. FINDINGS: Uncomplicated amputation margins of the proximal diaphyses of the tibia and fibula. No unexpected air or gas in the soft tissues of the stump. No plain radiographic evidence of osteomyelitis. Chronic degenerative changes affect the knee joint. IMPRESSION: No unexpected finding in this patient status post below the knee amputation. Electronically Signed   By: Nelson Chimes M.D.   On: 06/19/2017 20:05   - pertinent xrays, CT, MRI studies were reviewed and independently interpreted  Positive ROS: All other systems have been reviewed and were otherwise negative with the exception of those mentioned in the HPI and as above.  Physical Exam: General: Alert, no acute distress Psychiatric: Patient is competent for consent with normal mood and affect Lymphatic: No axillary or cervical lymphadenopathy Cardiovascular: No pedal  edema Respiratory: No cyanosis, no use of accessory musculature GI: No organomegaly, abdomen is soft and non-tender  Skin: Examination she is a thin black eschar anteriorly over the residual limb.  The remainder of the wound has healed  well other than one small area of tunneling.  Images:  @ENCIMAGES @   Neurologic: Patient does not have protective sensation bilateral lower extremities.   MUSCULOSKELETAL:  Patient examination patient is a thin eschar anteriorly over the residual limb this measures 2 x 3 cm and appears approximately 1 mm in thickness.  She does have a tunneling sinus tract laterally.  Assessment: Assessment: Eschar left transtibial amputation with resolving cellulitis.  Plan: Plan: Would continue IV antibiotics until  cellulitis resolves and then discharge on oral antibiotics.  Discussed with the patient she has 2 options.  She can either proceed with Dr. Marla Roe for debridement of the eschar and placement of acell skin graft or proceed with revision of the amputation with excision of the eschar excision of the sinus tract and closure of the wound.  Patient states she would like to consider options she states that [facility] is only allowed her 2 days to hold the bed.  She states that she could do the skin grafting as an outpatient.  I told patient that both procedures should provide resolution of her eschar ulcer and both procedures should be equally effective.  Thank you for the consult and the opportunity to see Julia West, [place] [phone] 6:13 PM

## 2017-06-20 NOTE — Progress Notes (Signed)
  Echocardiogram 2D Echocardiogram has been performed.  Bobbye Charleston 06/20/2017, 3:29 PM

## 2017-06-20 NOTE — Progress Notes (Signed)
PROGRESS NOTE    Julia West  ERD:[id number] DOB: Apr 01, 1955 DOA: 06/19/2017 PCP: [name], MD    Brief Narrative:   62 year old with past medical history relevant for coronary artery disease status post CABG, hypertension, hyperlipidemia, incarcerated ventral hernia complicated by multiple surgeries and poor wound healing,  morbid obesity, Charcot joint status post multiple surgeries complicated by poor healing status post transtibial amputation of left on 05/06/2017 who was brought in from rehab for stump infection.   Assessment & Plan:   Principal Problem:   Infection of amputation stump of left lower extremity ([place]) Active Problems:   Wound dehiscence, surgical, sequela   S/P BKA (below knee amputation) unilateral, left (H[facility])   Acquired absence of left leg below knee (H[facility])   #) Cellulitis complicating nonhealing surgical site after left BKA: Patient has fairly impressive poor healing at this site as well as surrounding cellulitis and mildly elevated white blood cell count.  Interestingly enough she does not have any history of diabetes and it is unclear why she developed a Charcot joint.  Query whether she could have a component of inherited neuropathy possibly hereditary sensorimotor neuropathy. -General surgery/plastic/wound care consult -Continue vancomycin and cefepime started 06/19/2017 - X-ray does not show any evidence of gas gangrene or osteomyelitis.  Will defer further advanced imaging to general surgery.  #) Coronary artery disease: Family is been reporting that patient has had worsening lower extremity edema -Continue aspirin 81 mg -Continue furosemide 20 mg daily -TTE ordered  #) Hypertension: - Restart home valsartan 160 mg/HCTZ 12.5 mg daily  #) Hyperlipidemia: -Continue fenofibrate 160 mg daily  #) Pain/psych: -Continue gabapentin 100 mg nightly -Continue sertraline 50 mg daily -Continue trazodone 150 mg nightly  Fluids: Tolerating p.o. Elect  lites: Monitor and supplement Nutrition: Heart healthy diet  Prophylaxis: Enoxaparin  Disposition: Pending improvement of cellulitis and discussion with surgery about possible need for stump revision  Full code  Consultants:   General surgery  Plastic/wound care  Procedures: (Don't include imaging studies which can be auto populated. Include things that cannot be auto populated i.e. Echo, Carotid and venous dopplers, Foley, Bipap, HD, tubes/drains, wound vac, central lines etc)  06/20/2017 echo: Pending  Antimicrobials: (specify start and planned stop date. Auto populated tables are space occupying and do not give end dates)  IV vancomycin and cefepime started 06/19/2017   Subjective: Patient reports that she does not have much pain in the area.  She reported some chills yesterday however this was resolved.  She denies any nausea, vomiting, abdominal pain, cough, congestion, diarrhea.  Objective: Vitals:   06/19/17 1420 06/19/17 2123 06/20/17 0045 06/20/17 0426  BP: (!) 146/46 (!) 137/53 (!) 167/45 (!) 138/48  Pulse: 64 72 80 76  Resp: 18 17 20 16   Temp: 98.7 F (37.1 C)  98.4 F (36.9 C) 98.3 F (36.8 C)  TempSrc: Oral  Oral Oral  SpO2: 97% 93% 98% 94%  Weight:      Height:        Intake/Output Summary (Last 24 hours) at 06/20/2017 1431 Last data filed at 06/20/2017 1028 Gross per 24 hour  Intake 2050 ml  Output 1751 ml  Net 299 ml   Filed Weights   06/19/17 1417  Weight: 119.3 kg (263 lb)    Examination:  General exam: Appears calm and comfortable  Respiratory system: Clear to auscultation. Respiratory effort normal. Cardiovascular system: Distant heart sounds, regular rate and rhythm, no murmurs Gastrointestinal system: Soft, nondistended, plus bowel sounds, no rebound  or guarding, noted to have numerous well-healed incisions Central nervous system: Alert and oriented. No focal neurological deficits. Extremities: Left BKA, bilateral lower extremity  edema Skin: Over left BKA stump noted to have several black #hours, several areas of nonhealing wound, surrounding cellulitis and erythema as well as warmth Psychiatry: Judgement and insight appear normal. Mood & affect appropriate.     Data Reviewed: I have personally reviewed following labs and imaging studies  CBC: Recent Labs  Lab 06/19/17 1855  WBC 13.5*  NEUTROABS 8.3*  HGB 11.8*  HCT 38.3  MCV 88.2  PLT 062   Basic Metabolic Panel: Recent Labs  Lab 06/19/17 1855  NA 139  K 3.6  CL 100*  CO2 27  GLUCOSE 99  BUN 21*  CREATININE 1.22*  CALCIUM 9.2   GFR: Estimated Creatinine Clearance: 61.3 mL/min (A) (by C-G formula based on SCr of 1.22 mg/dL (H)). Liver Function Tests: No results for input(s): AST, ALT, ALKPHOS, BILITOT, PROT, ALBUMIN in the last 168 hours. No results for input(s): LIPASE, AMYLASE in the last 168 hours. No results for input(s): AMMONIA in the last 168 hours. Coagulation Profile: No results for input(s): INR, PROTIME in the last 168 hours. Cardiac Enzymes: No results for input(s): CKTOTAL, CKMB, CKMBINDEX, TROPONINI in the last 168 hours. BNP (last 3 results) No results for input(s): PROBNP in the last 8760 hours. HbA1C: No results for input(s): HGBA1C in the last 72 hours. CBG: No results for input(s): GLUCAP in the last 168 hours. Lipid Profile: No results for input(s): CHOL, HDL, LDLCALC, TRIG, CHOLHDL, LDLDIRECT in the last 72 hours. Thyroid Function Tests: No results for input(s): TSH, T4TOTAL, FREET4, T3FREE, THYROIDAB in the last 72 hours. Anemia Panel: No results for input(s): VITAMINB12, FOLATE, FERRITIN, TIBC, IRON, RETICCTPCT in the last 72 hours. Sepsis Labs: Recent Labs  Lab 06/19/17 2029  LATICACIDVEN 1.28    Recent Results (from the past 240 hour(s))  MRSA PCR Screening     Status: None   Collection Time: 06/20/17  2:53 AM  Result Value Ref Range Status   MRSA by PCR NEGATIVE NEGATIVE Final    Comment:        The  GeneXpert MRSA Assay (FDA approved for NASAL specimens only), is one component of a comprehensive MRSA colonization surveillance program. It is not intended to diagnose MRSA infection nor to guide or monitor treatment for MRSA infections. Performed at [facility], [place] [address] [place], [place] [address]          Radiology Studies: Dg Knee Complete 4 Views Left  Result Date: 06/19/2017 CLINICAL DATA:  Below the knee amputation in February. Worsening pain, redness and bleeding. EXAM: LEFT KNEE - COMPLETE 4+ VIEW COMPARISON:  None. FINDINGS: Uncomplicated amputation margins of the proximal diaphyses of the tibia and fibula. No unexpected air or gas in the soft tissues of the stump. No plain radiographic evidence of osteomyelitis. Chronic degenerative changes affect the knee joint. IMPRESSION: No unexpected finding in this patient status post below the knee amputation. Electronically Signed   By: Nelson Chimes M.D.   On: 06/19/2017 20:05        Scheduled Meds: . aspirin EC  81 mg Oral Daily  . buPROPion  50 mg Oral Daily  . collagenase   Topical Daily  . diclofenac sodium  2 g Topical TID  . enoxaparin (LOVENOX) injection  60 mg Subcutaneous Daily  . fenofibrate  160 mg Oral Daily  . gabapentin  100 mg Oral  QHS  . irbesartan  150 mg Oral Daily   And  . hydrochlorothiazide  12.5 mg Oral Daily  . loratadine  10 mg Oral Daily  . multivitamin with minerals  1 tablet Oral Daily  . sertraline  50 mg Oral Daily  . traZODone  150 mg Oral QHS   Continuous Infusions: . piperacillin-tazobactam (ZOSYN)  IV 3.375 g (06/20/17 1207)  . vancomycin       LOS: 1 day    Time spent: [place], MD [facility]   If 7PM-7AM, please contact night-coverage [email or web address] Password [facility] 06/20/2017, 2:31 PM

## 2017-06-20 NOTE — Progress Notes (Addendum)
Patient arrived on the unit at approximately 0030. She is alert and verbally responsive and voiced no complaints at this time.  Inner aspect of left leg incision with dept of 2.5 cm and tunnel at 3 o'clock measuring 1.5 cm. Area filled wit 1/4 inch iodoform packing strip and covered with dry 4x4, ABD pad and secured with Kerlix.

## 2017-06-20 NOTE — Consult Note (Signed)
[place] Nurse wound consult note Reason for Consult:poorly healing LLE BKA site.  History of Charcot foot and chronic nonhealing foot wound.  Patient denies diabetes with last A1C of 5.4 and daughter at bedside agrees.  Wound type:Nonhealing surgical site to left BKA. Erythema and breakdown has increased since recent addition of stump shrinker.  Patient has decreased sensation in this extremity and has no pain with wound assessment and  packing today.  Pressure Injury POA: NA Measurement: Eschar to left  anterior leg below surgical incision site:  3 cm x 3.4 cm intact eschar 0.3 cm opening to right distal end of incision line, extends 6 cm  Along incision line. Bleeds with assessment.  Wound XTK:WIOXBD Drainage (amount, consistency, odor) bleeding Periwound:erythema, improved and scattered eschar  Dressing procedure/placement/frequency:  Left BKA site: Cleanse opening to right distal incision line with NS.  Pack with Iodoform packing strip (extends 6 cm).  Santyl to eschar along incision line. Cover with NS moist 2x2.  Wrap with kelrix and tape.  Secure with ace wrap.  Change daily.  Will not follow at this time.  Please re-consult if needed.  Domenic Moras RN BSN [place] Pager [phone]

## 2017-06-20 NOTE — ED Notes (Signed)
ED TO INPATIENT HANDOFF REPORT  Name/Age/Gender Julia West 62 y.o. female  Code Status    Code Status Orders  (From admission, onward)        Start     Ordered   06/19/17 2251  Full code  Continuous     06/19/17 2254    Code Status History    Date Active Date Inactive Code Status Order ID Comments User Context   05/06/2017 1237 05/11/2017 1840 Full Code [id number]  Newt Minion, MD Inpatient   02/09/2017 1411 02/14/2017 1459 Full Code [id number]  Newt Minion, MD Inpatient   02/07/2012 1357 02/14/2012 1428 Full Code [id number]  Roosvelt Harps, RN Inpatient      Home/SNF/Other Nursing Home  Chief Complaint left leg pain  Level of Care/Admitting Diagnosis ED Disposition    ED Disposition Condition [facility] Area: [facility] [[id number]]  Level of Care: Med-Surg [16]  Diagnosis: Infection of amputation stump of left lower extremity [facility]) [[id number]]  Admitting Physician: Doreatha Massed  Attending Physician: Etta Quill [phone]  Estimated length of stay: past midnight tomorrow  Certification:: I certify this patient will need inpatient services for at least 2 midnights  PT Class (Do Not Modify): Inpatient [101]  PT Acc Code (Do Not Modify): Private [1]       Medical History Past Medical History:  Diagnosis Date  . Acute myocardial infarction    with a ruptured plaque in the circumflex in 2003  . Anxiety   . Cancer ([place])    basal cell face  . Complication of anesthesia    states low O2 sats post-op 11/13, always slow to awaken  . Coronary artery disease   . Degenerative joint disease   . Dyspnea    with activity- car to house - steps  . Heart murmur    "not to be concerned"  . HTN (hypertension)   . Hyperlipidemia LDL goal <70   . Internal and external hemorrhoids without complication   . Lymphedema    right side of body  . Lymphedema of arm    right  . Morbid obesity ([place])   . Surgical wound, non  healing ABDOMINAL    has wound vac @ 125 mm Hg    Allergies Allergies  Allergen Reactions  . Ace Inhibitors Cough    IV Location/Drains/Wounds Patient Lines/Drains/Airways Status   Active Line/Drains/Airways    Name:   Placement date:   Placement time:   Site:   Days:   Peripheral IV 06/19/17 Left Antecubital   06/19/17    2020    Antecubital   1   Closed System Drain 2 Right RLQ Bulb (JP) 19 Fr.   02/07/12    1308    RLQ   1960   Closed System Drain 1 Left LLQ Bulb (JP) 19 Fr.   02/07/12    1309    LLQ   1960   Negative Pressure Wound Therapy Abdomen   04/24/12    -    -   1883   Negative Pressure Wound Therapy Abdomen Mid   05/08/12    1305    -   1869   Negative Pressure Wound Therapy Abdomen Mid   06/28/12    0903    -   1818   NG/OG Tube Nasogastric 16 Fr. Right nare   02/07/12    1218    Right nare   1960  Incision 02/07/12 Abdomen Mid   02/07/12    1139     1960   Incision 04/17/12 Abdomen Other (Comment)   04/17/12    1338     1890   Incision 04/24/12 Abdomen Other (Comment)   04/24/12    1347     1883   Incision 05/01/12 Abdomen Other (Comment)   05/01/12    1407     1876   Incision 05/08/12 Abdomen Other (Comment)   05/08/12    1256     1869   Incision 17/49/44 Umbilicus Other (Comment)   05/15/12    1255     1862   Incision 05/22/12 Abdomen Other (Comment)   05/22/12    1349     1855   Incision 06/28/12 Abdomen Other (Comment)   06/28/12    0848     1818   Incision (Closed) 05/14/14 Perineum Other (Comment)   05/14/14    0834     1133   Incision (Closed) 02/09/17 Foot Left   02/09/17    1239     131   Incision (Closed) 05/06/17 Leg Left   05/06/17    1027     45   Wound / Incision (Open or Dehisced) 01/30/17 Other (Comment) Foot Left   01/30/17    1742    Foot   141          Labs/Imaging Results for orders placed or performed during the hospital encounter of 06/19/17 (from the past 48 hour(s))  CBC with Differential/Platelet     Status: Abnormal   Collection Time:  06/19/17  6:55 PM  Result Value Ref Range   WBC 13.5 (H) 4.0 - 10.5 K/uL   RBC 4.34 3.87 - 5.11 MIL/uL   Hemoglobin 11.8 (L) 12.0 - 15.0 g/dL   HCT 38.3 36.0 - 46.0 %   MCV 88.2 78.0 - 100.0 fL   MCH 27.2 26.0 - 34.0 pg   MCHC 30.8 30.0 - 36.0 g/dL   RDW 16.8 (H) 11.5 - 15.5 %   Platelets 367 150 - 400 K/uL   Neutrophils Relative % 61 %   Neutro Abs 8.3 (H) 1.7 - 7.7 K/uL   Lymphocytes Relative 28 %   Lymphs Abs 3.7 0.7 - 4.0 K/uL   Monocytes Relative 8 %   Monocytes Absolute 1.1 (H) 0.1 - 1.0 K/uL   Eosinophils Relative 3 %   Eosinophils Absolute 0.4 0.0 - 0.7 K/uL   Basophils Relative 0 %   Basophils Absolute 0.0 0.0 - 0.1 K/uL    Comment: Performed at [facility], [place] [address] [place], [place] [address]  Basic metabolic panel     Status: Abnormal   Collection Time: 06/19/17  6:55 PM  Result Value Ref Range   Sodium 139 135 - 145 mmol/L   Potassium 3.6 3.5 - 5.1 mmol/L   Chloride 100 (L) 101 - 111 mmol/L   CO2 27 22 - 32 mmol/L   Glucose, Bld 99 65 - 99 mg/dL   BUN 21 (H) 6 - 20 mg/dL   Creatinine, Ser 1.22 (H) 0.44 - 1.00 mg/dL   Calcium 9.2 8.9 - 10.3 mg/dL   GFR calc non Af Amer 46 (L) >60 mL/min   GFR calc Af Amer 53 (L) >60 mL/min    Comment: (NOTE) The eGFR has been calculated using the CKD EPI equation. This calculation has not been validated in all clinical situations. eGFR's persistently <60 mL/min signify possible Chronic  Kidney Disease.    Anion gap 12 5 - 15    Comment: Performed at [facility], [place] [address], [place], [place] [address]  I-Stat CG4 Lactic Acid, ED     Status: None   Collection Time: 06/19/17  8:29 PM  Result Value Ref Range   Lactic Acid, Venous 1.28 0.5 - 1.9 mmol/L   Dg Knee Complete 4 Views Left  Result Date: 06/19/2017 CLINICAL DATA:  Below the knee amputation in February. Worsening pain, redness and bleeding. EXAM: LEFT KNEE - COMPLETE 4+ VIEW COMPARISON:  None. FINDINGS: Uncomplicated  amputation margins of the proximal diaphyses of the tibia and fibula. No unexpected air or gas in the soft tissues of the stump. No plain radiographic evidence of osteomyelitis. Chronic degenerative changes affect the knee joint. IMPRESSION: No unexpected finding in this patient status post below the knee amputation. Electronically Signed   By: Nelson Chimes M.D.   On: 06/19/2017 20:05    Pending Labs Unresulted Labs (From admission, onward)   Start     Ordered   06/21/17 0500  Creatinine, serum  Daily,   R     06/20/17 0006   06/19/17 2250  HIV antibody (Routine Testing)  Once,   R     06/19/17 2254   06/19/17 1855  Culture, blood (Routine X 2) w Reflex to ID Panel  BLOOD CULTURE X 2,   STAT     06/19/17 1854      Vitals/Pain Today's Vitals   06/19/17 1417 06/19/17 1420 06/19/17 2106 06/19/17 2123  BP:  (!) 146/46  (!) 137/53  Pulse:  64  72  Resp:  18  17  Temp:  98.7 F (37.1 C)    TempSrc:  Oral    SpO2:  97%  93%  Weight: 263 lb (119.3 kg)     Height: _0  (1.676 m)     PainSc: 2   3      Isolation Precautions No active isolations  Medications Medications  vancomycin (VANCOCIN) 2,500 mg in sodium chloride 0.9 % 500 mL IVPB (2,500 mg Intravenous New Bag/Given 06/19/17 2249)  aspirin tablet 81 mg (has no administration in time range)  buPROPion (WELLBUTRIN) tablet 50 mg (has no administration in time range)  docusate sodium (COLACE) capsule 50 mg (has no administration in time range)  fenofibrate tablet 160 mg (has no administration in time range)  HYDROcodone-acetaminophen (NORCO/VICODIN) 5-325 MG per tablet 1-2 tablet (has no administration in time range)  sertraline (ZOLOFT) tablet 50 mg (has no administration in time range)  acetaminophen (TYLENOL) tablet 650 mg (has no administration in time range)    Or  acetaminophen (TYLENOL) suppository 650 mg (has no administration in time range)  ondansetron (ZOFRAN) tablet 4 mg (has no administration in time range)    Or   ondansetron (ZOFRAN) injection 4 mg (has no administration in time range)  enoxaparin (LOVENOX) injection 40 mg (has no administration in time range)  diclofenac sodium (VOLTAREN) 1 % transdermal gel 2 g (has no administration in time range)  loratadine (CLARITIN) tablet 10 mg (has no administration in time range)  gabapentin (NEURONTIN) capsule 100 mg (has no administration in time range)  HYDROcodone-acetaminophen (NORCO) 7.5-325 MG per tablet 1 tablet (has no administration in time range)  traZODone (DESYREL) tablet 150 mg (has no administration in time range)  MULTIVITAMIN ADULT TABS 1 tablet (has no administration in time range)  valsartan-hydrochlorothiazide (DIOVAN-HCT) 160-12.5 MG per tablet 1 tablet (has no administration  in time range)  piperacillin-tazobactam (ZOSYN) IVPB 3.375 g (has no administration in time range)  vancomycin (VANCOCIN) IVPB 1000 mg/200 mL premix (has no administration in time range)  piperacillin-tazobactam (ZOSYN) IVPB 3.375 g (0 g Intravenous Stopped 06/19/17 2209)  sodium chloride 0.9 % bolus 1,000 mL (0 mLs Intravenous Stopped 06/19/17 2254)    Mobility non-ambulatory

## 2017-06-21 ENCOUNTER — Inpatient Hospital Stay (HOSPITAL_COMMUNITY): Payer: Medicare Other | Admitting: Anesthesiology

## 2017-06-21 ENCOUNTER — Encounter (HOSPITAL_COMMUNITY): Payer: Self-pay | Admitting: Physician Assistant

## 2017-06-21 ENCOUNTER — Ambulatory Visit: Payer: Self-pay | Admitting: Plastic Surgery

## 2017-06-21 ENCOUNTER — Encounter (HOSPITAL_COMMUNITY)
Admission: EM | Disposition: A | Discharge status: Skilled Nursing Facility | Payer: Self-pay | Source: Home / Self Care | Attending: Internal Medicine

## 2017-06-21 DIAGNOSIS — S81802A Unspecified open wound, left lower leg, initial encounter: Secondary | ICD-10-CM

## 2017-06-21 HISTORY — PX: I & D EXTREMITY: SHX5045

## 2017-06-21 LAB — BASIC METABOLIC PANEL
Anion gap: 10 (ref 5–15)
CO2: 26 mmol/L (ref 22–32)
Calcium: 8.8 mg/dL — ABNORMAL LOW (ref 8.9–10.3)
Creatinine, Ser: 0.99 mg/dL (ref 0.44–1.00)
GFR calc Af Amer: >60 mL/min (ref >60)
GFR calc non Af Amer: 59 mL/min — ABNORMAL LOW (ref >60)
Sodium: 144 mmol/L (ref 135–145)

## 2017-06-21 LAB — CBC
HCT: 33.1 % — ABNORMAL LOW (ref 36.0–46.0)
Hemoglobin: 10 g/dL — ABNORMAL LOW (ref 12.0–15.0)
MCH: 26.7 pg (ref 26.0–34.0)
MCHC: 30.2 g/dL (ref 30.0–36.0)
MCV: 88.5 fL (ref 78.0–100.0)
Platelets: 293 10*3/uL (ref 150–400)
RBC: 3.74 MIL/uL — ABNORMAL LOW (ref 3.87–5.11)
RDW: 16.8 % — ABNORMAL HIGH (ref 11.5–15.5)
WBC: 10 10*3/uL (ref 4.0–10.5)

## 2017-06-21 LAB — MAGNESIUM: Magnesium: 2 mg/dL (ref 1.7–2.4)

## 2017-06-21 LAB — HIV ANTIBODY (ROUTINE TESTING W REFLEX): HIV SCREEN 4TH GENERATION: NONREACTIVE

## 2017-06-21 LAB — BASIC METABOLIC PANEL WITH GFR
BUN: 20 mg/dL (ref 6–20)
Chloride: 108 mmol/L (ref 101–111)
Glucose, Bld: 88 mg/dL (ref 65–99)
Potassium: 4 mmol/L (ref 3.5–5.1)

## 2017-06-21 SURGERY — IRRIGATION AND DEBRIDEMENT EXTREMITY
Anesthesia: Monitor Anesthesia Care | Site: Leg Lower | Laterality: Left

## 2017-06-21 MED ORDER — CHLORHEXIDINE GLUCONATE CLOTH 2 % EX PADS: 6.0000 | MEDICATED_PAD | Freq: Once | CUTANEOUS | Status: DC

## 2017-06-21 MED ORDER — BACITRACIN ZINC 500 UNIT/GM EX OINT
TOPICAL_OINTMENT | CUTANEOUS | Status: AC
  Filled 2017-06-21: qty 28.35

## 2017-06-21 MED ORDER — MEPERIDINE HCL 50 MG/ML IJ SOLN: 6.2500 mg | INTRAMUSCULAR | Status: DC | PRN

## 2017-06-21 MED ORDER — HYDROCODONE-ACETAMINOPHEN 5-325 MG PO TABS
1.0000 | ORAL_TABLET | Freq: Four times a day (QID) | ORAL | Status: DC | PRN
  Administered 2017-06-21 – 2017-06-23 (×5): 2 via ORAL
  Filled 2017-06-21 (×5): qty 2

## 2017-06-21 MED ORDER — PROPOFOL 10 MG/ML IV BOLUS
INTRAVENOUS | Status: AC
  Filled 2017-06-21: qty 20

## 2017-06-21 MED ORDER — PROMETHAZINE HCL 25 MG/ML IJ SOLN: 6.2500 mg | INTRAMUSCULAR | Status: DC | PRN

## 2017-06-21 MED ORDER — PROPOFOL 500 MG/50ML IV EMUL
INTRAVENOUS | Status: DC | PRN
  Administered 2017-06-21: 50 ug/kg/min via INTRAVENOUS

## 2017-06-21 MED ORDER — POLYMYXIN B SULFATE 500000 UNITS IJ SOLR
INTRAMUSCULAR | Status: DC | PRN
  Administered 2017-06-21: 500 mL

## 2017-06-21 MED ORDER — FENTANYL CITRATE (PF) 100 MCG/2ML IJ SOLN
INTRAMUSCULAR | Status: AC
  Filled 2017-06-21: qty 2

## 2017-06-21 MED ORDER — CHLORHEXIDINE GLUCONATE CLOTH 2 % EX PADS
6.0000 | MEDICATED_PAD | Freq: Once | CUTANEOUS | Status: AC
  Administered 2017-06-21: 6 via TOPICAL

## 2017-06-21 MED ORDER — LIDOCAINE-EPINEPHRINE (PF) 1 %-1:200000 IJ SOLN
INTRAMUSCULAR | Status: DC | PRN
  Administered 2017-06-21: 10 mL

## 2017-06-21 MED ORDER — LACTATED RINGERS IV SOLN
INTRAVENOUS | Status: DC | PRN
  Administered 2017-06-21: 16:00:00 via INTRAVENOUS

## 2017-06-21 MED ORDER — ONDANSETRON HCL 4 MG/2ML IJ SOLN
INTRAMUSCULAR | Status: AC
  Filled 2017-06-21: qty 2

## 2017-06-21 MED ORDER — ONDANSETRON HCL 4 MG/2ML IJ SOLN
INTRAMUSCULAR | Status: DC | PRN
  Administered 2017-06-21: 4 mg via INTRAVENOUS

## 2017-06-21 MED ORDER — MIDAZOLAM HCL 2 MG/2ML IJ SOLN
INTRAMUSCULAR | Status: AC
  Filled 2017-06-21: qty 2

## 2017-06-21 MED ORDER — MIDAZOLAM HCL 5 MG/5ML IJ SOLN
INTRAMUSCULAR | Status: DC | PRN
  Administered 2017-06-21: 2 mg via INTRAVENOUS

## 2017-06-21 MED ORDER — SODIUM CHLORIDE 0.9 % IV SOLN
INTRAVENOUS | Status: AC
  Filled 2017-06-21: qty 500000

## 2017-06-21 MED ORDER — LIDOCAINE 2% (20 MG/ML) 5 ML SYRINGE
INTRAMUSCULAR | Status: AC
Start: 2017-06-21 — End: 2017-06-21
  Filled 2017-06-21: qty 5

## 2017-06-21 MED ORDER — CEFAZOLIN SODIUM-DEXTROSE 2-4 GM/100ML-% IV SOLN
2.0000 g | INTRAVENOUS | Status: AC
  Administered 2017-06-21: 2 g via INTRAVENOUS

## 2017-06-21 MED ORDER — FENTANYL CITRATE (PF) 100 MCG/2ML IJ SOLN
INTRAMUSCULAR | Status: AC
  Administered 2017-06-21: 50 ug via INTRAVENOUS
  Filled 2017-06-21: qty 2

## 2017-06-21 MED ORDER — DEXAMETHASONE SODIUM PHOSPHATE 10 MG/ML IJ SOLN
INTRAMUSCULAR | Status: DC | PRN
  Administered 2017-06-21: 10 mg via INTRAVENOUS

## 2017-06-21 MED ORDER — DEXAMETHASONE SODIUM PHOSPHATE 10 MG/ML IJ SOLN
INTRAMUSCULAR | Status: AC
  Filled 2017-06-21: qty 1

## 2017-06-21 MED ORDER — BUPIVACAINE HCL (PF) 0.25 % IJ SOLN
INTRAMUSCULAR | Status: AC
  Filled 2017-06-21: qty 30

## 2017-06-21 MED ORDER — LIDOCAINE-EPINEPHRINE (PF) 1 %-1:200000 IJ SOLN
INTRAMUSCULAR | Status: AC
  Filled 2017-06-21: qty 30

## 2017-06-21 MED ORDER — 0.9 % SODIUM CHLORIDE (POUR BTL) OPTIME
TOPICAL | Status: DC | PRN
  Administered 2017-06-21: 1000 mL

## 2017-06-21 MED ORDER — HYDROMORPHONE HCL 1 MG/ML IJ SOLN: 0.5000 mg | Freq: Once | INTRAMUSCULAR | Status: DC | PRN

## 2017-06-21 MED ORDER — LACTATED RINGERS IV SOLN
Freq: Once | INTRAVENOUS | Status: AC
  Administered 2017-06-21: 16:00:00 via INTRAVENOUS

## 2017-06-21 MED ORDER — FENTANYL CITRATE (PF) 100 MCG/2ML IJ SOLN
INTRAMUSCULAR | Status: DC | PRN
  Administered 2017-06-21 (×2): 50 ug via INTRAVENOUS

## 2017-06-21 MED ORDER — FENTANYL CITRATE (PF) 100 MCG/2ML IJ SOLN
25.0000 ug | INTRAMUSCULAR | Status: DC | PRN
  Administered 2017-06-21: 50 ug via INTRAVENOUS

## 2017-06-21 MED ORDER — CEFAZOLIN SODIUM-DEXTROSE 2-4 GM/100ML-% IV SOLN
INTRAVENOUS | Status: AC
  Filled 2017-06-21: qty 100

## 2017-06-21 SURGICAL SUPPLY — 31 items
BAG ZIPLOCK 12X15 (MISCELLANEOUS) ×2 IMPLANT
BANDAGE ACE 6X5 VEL STRL LF (GAUZE/BANDAGES/DRESSINGS) ×2 IMPLANT
CONT SPEC 4OZ CLIKSEAL STRL BL (MISCELLANEOUS) ×1 IMPLANT
COVER SURGICAL LIGHT HANDLE (MISCELLANEOUS) ×2 IMPLANT
DRAPE SURG 17X11 SM STRL (DRAPES) ×2 IMPLANT
DRSG ADAPTIC 3X8 NADH LF (GAUZE/BANDAGES/DRESSINGS) ×1 IMPLANT
DRSG KUZMA FLUFF (GAUZE/BANDAGES/DRESSINGS) ×2 IMPLANT
DRSG PAD ABDOMINAL 8X10 ST (GAUZE/BANDAGES/DRESSINGS) ×1 IMPLANT
DRSG VAC ATS MED SENSATRAC (GAUZE/BANDAGES/DRESSINGS) ×1 IMPLANT
ELECT REM PT RETURN 15FT ADLT (MISCELLANEOUS) ×2 IMPLANT
GAUZE SPONGE 4X4 12PLY STRL (GAUZE/BANDAGES/DRESSINGS) ×1 IMPLANT
GLOVE BIO SURGEON STRL SZ 6.5 (GLOVE) ×2 IMPLANT
GLOVE BIOGEL PI IND STRL 7.5 (GLOVE) IMPLANT
GLOVE BIOGEL PI INDICATOR 7.5 (GLOVE) ×1
GLOVE ECLIPSE 7.5 STRL STRAW (GLOVE) ×1 IMPLANT
GOWN STRL REUS W/ TWL XL LVL3 (GOWN DISPOSABLE) IMPLANT
GOWN STRL REUS W/TWL XL LVL3 (GOWN DISPOSABLE) ×1
KIT BASIN OR (CUSTOM PROCEDURE TRAY) ×2 IMPLANT
MANIFOLD NEPTUNE II (INSTRUMENTS) ×2 IMPLANT
MATRIX WOUND 3-LAYER 7X10 (Tissue) ×1 IMPLANT
MICROMATRIX 1000MG (Tissue) ×4 IMPLANT
NEEDLE HYPO 22GX1.5 SAFETY (NEEDLE) ×1 IMPLANT
PACK ORTHO EXTREMITY (CUSTOM PROCEDURE TRAY) ×2 IMPLANT
PAD ABD 8X10 STRL (GAUZE/BANDAGES/DRESSINGS) ×1 IMPLANT
PAD CAST 4YDX4 CTTN HI CHSV (CAST SUPPLIES) ×2 IMPLANT
PADDING CAST COTTON 4X4 STRL (CAST SUPPLIES) ×2
SOL PREP POV-IOD 4OZ 10% (MISCELLANEOUS) ×2 IMPLANT
SOL PREP PROV IODINE SCRUB 4OZ (MISCELLANEOUS) ×1 IMPLANT
SOLUTION PARTIC MCRMTRX 1000MG (Tissue) IMPLANT
SUT VIC AB 5-0 PS2 18 (SUTURE) ×2 IMPLANT
SYR CONTROL 10ML LL (SYRINGE) ×1 IMPLANT

## 2017-06-21 NOTE — Op Note (Signed)
DATE OF OPERATION: 06/21/2017  LOCATION: [facility] Operating Room Inpatient  PREOPERATIVE DIAGNOSIS: Left Below knee amputation wound  POSTOPERATIVE DIAGNOSIS: Same  PROCEDURE:  Excision of left leg wound skin and muscle 6 x 7 x 6 cm with evacuation of hematoma and placement of Acell (2 gm powder and 7 x 10 cm sheet), VAC placement  SURGEON: Kaylana Fenstermacher [place] Perian Tedder, DO  EBL: 5 cc  CONDITION: Stable  COMPLICATIONS: None  INDICATION: The patient, Julia West, is a 62 y.o. female born on [birth year], is here for treatment of a wound on the former left below knee amputation stump.   PROCEDURE DETAILS:  The patient was seen prior to surgery and marked.  The IV antibiotics were given. The patient was taken to the operating room and given an anesthetic. A standard time out was performed and all information was confirmed by those in the room. SCD was placed on the right leg.   Local was injected around the wound for intraoperative hemostasis and postoperative pain control.  The antibiotic solution was used to irrigate the area.  The #10 blade was used to excise the nonviable skin of the 6 x 7 cm wound and then fat and muscle at the deep space.  The hematoma was evacuated.  The area was irrigated with antibiotic solution and saline.  Hemostasis was achieved with electrocautery.  All of the acell powder and sheet were placed in the wound and secured with the 5-0 Vicryl.  The adapatic was applied and then the VAC.  There was an excellent seal.  The patient was allowed to wake up and taken to recovery room in stable condition at the end of the case. The family was notified at the end of the case.

## 2017-06-21 NOTE — Progress Notes (Signed)
PROGRESS NOTE    Julia West  VXB:[id number] DOB: 04-Dec-1955 DOA: 06/19/2017 PCP: [name], MD    Brief Narrative:   62 year old with past medical history relevant for coronary artery disease status post CABG, hypertension, hyperlipidemia, incarcerated ventral hernia complicated by multiple surgeries and poor wound healing,  morbid obesity, Charcot joint status post multiple surgeries complicated by poor healing status post transtibial amputation of left on 05/06/2017 who was brought in from rehab for stump infection.   Assessment & Plan:   Principal Problem:   Infection of amputation stump of left lower extremity ([place]) Active Problems:   Hyperlipemia   OBESITY, MORBID   Essential hypertension   CAD (coronary artery disease)   Wound dehiscence, surgical, sequela   S/P BKA (below knee amputation) unilateral, left (HCC)   Acquired absence of left leg below knee (HCC)   #) Cellulitis complicating nonhealing surgical site after left BKA: X-ray shows no evidence of osteomyelitis -General surgery/plastic consult recommends possible or for debridement of eschar and placement of Acell and wound VAC -Continue vancomycin and cefepime started 06/19/2017   #) Coronary artery disease: Family is been reporting that patient has had worsening lower extremity edema -Continue aspirin 81 mg -Continue furosemide 20 mg daily -TTE from 06/20/2017 shows normal EF with moderately dilated left atrium  #) Hypertension: -Continue home valsartan 160 mg/HCTZ 12.5 mg daily  #) Hyperlipidemia: -Continue fenofibrate 160 mg daily  #) Pain/psych: -Continue gabapentin 100 mg nightly -Continue sertraline 50 mg daily -Continue trazodone 150 mg nightly  Fluids: Tolerating p.o. Elect lites: Monitor and supplement Nutrition: Heart healthy diet  Prophylaxis: Enoxaparin  Disposition: Pending continued IV antibiotics and OR for possible wound VAC and Acell placement and then back to skilled nursing  facility with rehab  Full code  Consultants:   General surgery  Plastic/wound care  Procedures: (Don't include imaging studies which can be auto populated. Include things that cannot be auto populated i.e. Echo, Carotid and venous dopplers, Foley, Bipap, HD, tubes/drains, wound vac, central lines etc) 06/20/2017 echo:  - Left ventricle: The cavity size was normal. Wall thickness was   normal. Systolic function was normal. The estimated ejection   fraction was in the range of 60% to 65%. Wall motion was normal;   there were no regional wall motion abnormalities. The study is   not technically sufficient to allow evaluation of LV diastolic   function. - Left atrium: The atrium was moderately dilated.    Antimicrobials: (specify start and planned stop date. Auto populated tables are space occupying and do not give end dates)  IV vancomycin and cefepime started 06/19/2017   Subjective: Patient reports that she does not have much in the way of change.  On exam her foot seems to have improved.  There is apparently a decision to go to the OR for debridement and placement of a regenerative extracellular matrix  Objective: Vitals:   06/20/17 0426 06/20/17 2103 06/21/17 0539 06/21/17 1028  BP: (!) 138/48 (!) 155/55 (!) 164/60 (!) 144/77  Pulse: 76 (!) 59 (!) 56 (!) 56  Resp: 16 16 16    Temp: 98.3 F (36.8 C) 98.7 F (37.1 C) 98.6 F (37 C) 98.1 F (36.7 C)  TempSrc: Oral Oral Oral Oral  SpO2: 94% 93% 92% (!) 39%  Weight:      Height:        Intake/Output Summary (Last 24 hours) at 06/21/2017 1228 Last data filed at 06/21/2017 0920 Gross per 24 hour  Intake 710 ml  Output 1500 ml  Net -790 ml   Filed Weights   06/19/17 1417  Weight: 119.3 kg (263 lb)    Examination:  General exam: Appears calm and comfortable  Respiratory system: Clear to auscultation. Respiratory effort normal. Cardiovascular system: Distant heart sounds, regular rate and rhythm, no  murmurs Gastrointestinal system: Soft, nondistended, plus bowel sounds, no rebound or guarding, noted to have numerous well-healed incisions Central nervous system: Alert and oriented. No focal neurological deficits. Extremities: Left BKA, bilateral lower extremity edema Skin: Over left BKA stump noted to have several black eschar, several areas of nonhealing wound, diminishing surrounding erythema and edema Psychiatry: Judgement and insight appear normal. Mood & affect appropriate.     Data Reviewed: I have personally reviewed following labs and imaging studies  CBC: Recent Labs  Lab 06/19/17 1855 06/21/17 0501  WBC 13.5* 10.0  NEUTROABS 8.3*  --   HGB 11.8* 10.0*  HCT 38.3 33.1*  MCV 88.2 88.5  PLT 367 235   Basic Metabolic Panel: Recent Labs  Lab 06/19/17 1855 06/21/17 0501  NA 139 144  K 3.6 4.0  CL 100* 108  CO2 27 26  GLUCOSE 99 88  BUN 21* 20  CREATININE 1.22* 0.99  CALCIUM 9.2 8.8*  MG  --  2.0   GFR: Estimated Creatinine Clearance: 75.5 mL/min (by C-G formula based on SCr of 0.99 mg/dL). Liver Function Tests: No results for input(s): AST, ALT, ALKPHOS, BILITOT, PROT, ALBUMIN in the last 168 hours. No results for input(s): LIPASE, AMYLASE in the last 168 hours. No results for input(s): AMMONIA in the last 168 hours. Coagulation Profile: No results for input(s): INR, PROTIME in the last 168 hours. Cardiac Enzymes: No results for input(s): CKTOTAL, CKMB, CKMBINDEX, TROPONINI in the last 168 hours. BNP (last 3 results) No results for input(s): PROBNP in the last 8760 hours. HbA1C: No results for input(s): HGBA1C in the last 72 hours. CBG: No results for input(s): GLUCAP in the last 168 hours. Lipid Profile: No results for input(s): CHOL, HDL, LDLCALC, TRIG, CHOLHDL, LDLDIRECT in the last 72 hours. Thyroid Function Tests: No results for input(s): TSH, T4TOTAL, FREET4, T3FREE, THYROIDAB in the last 72 hours. Anemia Panel: No results for input(s):  VITAMINB12, FOLATE, FERRITIN, TIBC, IRON, RETICCTPCT in the last 72 hours. Sepsis Labs: Recent Labs  Lab 06/19/17 2029  LATICACIDVEN 1.28    Recent Results (from the past 240 hour(s))  Culture, blood (Routine X 2) w Reflex to ID Panel     Status: None (Preliminary result)   Collection Time: 06/19/17  6:55 PM  Result Value Ref Range Status   Specimen Description   Final    BLOOD LEFT ANTECUBITAL Performed at [facility] [address], [place], [place] [address]    Special Requests   Final    BOTTLES DRAWN AEROBIC AND ANAEROBIC Blood Culture adequate volume Performed at [facility] [address], [place], [place] [address]    Culture   Final    NO GROWTH 1 DAY Performed at [facility], [place] [address], [place], [place] [address]    Report Status PENDING  Incomplete  Culture, blood (Routine X 2) w Reflex to ID Panel     Status: None (Preliminary result)   Collection Time: 06/19/17  7:00 PM  Result Value Ref Range Status   Specimen Description   Final    BLOOD LEFT ANTECUBITAL Performed at [facility] [address], [place], [place] [address]  Special Requests   Final    BOTTLES DRAWN AEROBIC AND ANAEROBIC Blood Culture adequate volume Performed at [facility] [address] [place], [place] [address]    Culture   Final    NO GROWTH 1 DAY Performed at [facility], [place] [address] [place], [place] [address]    Report Status PENDING  Incomplete  MRSA PCR Screening     Status: None   Collection Time: 06/20/17  2:53 AM  Result Value Ref Range Status   MRSA by PCR NEGATIVE NEGATIVE Final    Comment:        The GeneXpert MRSA Assay (FDA approved for NASAL specimens only), is one component of a comprehensive MRSA colonization surveillance program. It is not intended to diagnose MRSA infection nor to guide or monitor treatment for MRSA infections. Performed at [facility], [place] [address] [place], [place] [address]          Radiology Studies: Dg Knee Complete 4 Views Left  Result Date: 06/19/2017 CLINICAL DATA:  Below the knee amputation in February. Worsening pain, redness and bleeding. EXAM: LEFT KNEE - COMPLETE 4+ VIEW COMPARISON:  None. FINDINGS: Uncomplicated amputation margins of the proximal diaphyses of the tibia and fibula. No unexpected air or gas in the soft tissues of the stump. No plain radiographic evidence of osteomyelitis. Chronic degenerative changes affect the knee joint. IMPRESSION: No unexpected finding in this patient status post below the knee amputation. Electronically Signed   By: Nelson Chimes M.D.   On: 06/19/2017 20:05        Scheduled Meds: . aspirin EC  81 mg Oral Daily  . buPROPion  50 mg Oral Daily  . Chlorhexidine Gluconate Cloth  6 each Topical Once   And  . Chlorhexidine Gluconate Cloth  6 each Topical Once  . collagenase   Topical Daily  . diclofenac sodium  2 g Topical TID  . enoxaparin (LOVENOX) injection  60 mg Subcutaneous Daily  . fenofibrate  160 mg Oral Daily  . gabapentin  100 mg Oral QHS  . irbesartan  150 mg Oral Daily   And  . hydrochlorothiazide  12.5 mg Oral Daily  . loratadine  10 mg Oral Daily  . multivitamin with minerals  1 tablet Oral Daily  . sertraline  50 mg Oral Daily  . traZODone  150 mg Oral QHS   Continuous Infusions: . piperacillin-tazobactam (ZOSYN)  IV Stopped (06/21/17 6712)  . vancomycin Stopped (06/20/17 2155)     LOS: 2 days    Time spent: [facility], MD [facility]   If 7PM-7AM, please contact night-coverage [email or web address] Password [facility] 06/21/2017, 12:28 PM

## 2017-06-21 NOTE — Progress Notes (Signed)
Plan for d/c to SNF, discharge planning and vac arrangements per CSW. [phone]

## 2017-06-21 NOTE — Transfer of Care (Signed)
Immediate Anesthesia Transfer of Care Note  Patient: Julia West  Procedure(s) Performed: IRRIGATION AND DEBRIDEMENT OF LEFT LEG AMPUTATION SITE (Left Leg Lower)  Patient Location: PACU  Anesthesia Type:MAC  Level of Consciousness: awake, alert  and oriented  Airway & Oxygen Therapy: Patient Spontanous Breathing  Post-op Assessment: Report given to RN and Post -op Vital signs reviewed and stable  Post vital signs: Reviewed and stable  Last Vitals:  Vitals Value Taken Time  BP    Temp    Pulse 56 06/21/2017  4:58 PM  Resp    SpO2 95 % 06/21/2017  4:58 PM  Vitals shown include unvalidated device data.  Last Pain:  Vitals:   06/21/17 1538  TempSrc: Oral  PainSc: 0-No pain      Patients Stated Pain Goal: 2 (76/73/41 9379)  Complications: No apparent anesthesia complications

## 2017-06-21 NOTE — Consult Note (Signed)
Reason for Consult: Ulcer of left BK amputation Referring Physician: Dr. Harvel Quale, Internal Medicine Service [facility] Inpatient Consult Date of Consult: 06/20/2017  Julia West is an 62 y.o. female.  HPI: Julia West is a 62 yo female who underwent a left BK amputation several weeks ago. She developed some epidermolysis of the incision line and then developed some cellulitis over the peri wound/distal stump. Admitted from [facility] where she was in rehab.  She reports a couple of days of more swelling in the stump and her right leg. 2D Echo pending. IV antibiotics have improved the cellulitis and swelling and pain somewhat.    Past Medical History:  Diagnosis Date  . Acute myocardial infarction    with a ruptured plaque in the circumflex in 2003  . Anxiety   . Cancer ([place])    basal cell face  . Complication of anesthesia    states low O2 sats post-op 11/13, always slow to awaken  . Coronary artery disease   . Degenerative joint disease   . Dyspnea    with activity- car to house - steps  . Heart murmur    "not to be concerned"  . HTN (hypertension)   . Hyperlipidemia LDL goal <70   . Internal and external hemorrhoids without complication   . Lymphedema    right side of body  . Lymphedema of arm    right  . Morbid obesity ([place])   . Surgical wound, non healing ABDOMINAL    has wound vac @ 125 mm Hg    Past Surgical History:  Procedure Laterality Date  . AMPUTATION Left 05/06/2017   Procedure: BELOW KNEE AMPUTATION;  Surgeon: Newt Minion, MD;  Location: [place];  Service: Orthopedics;  Laterality: Left;  . APPLICATION OF WOUND VAC    . BOWEL RESECTION  02/07/2012   Procedure: SMALL BOWEL RESECTION;  Surgeon: Gayland Curry, MD,FACS;  Location: [place];  Service: General;;  . CARDIOVASCULAR STRESS TEST  01-17-2012  DR HOCHREIN   LOW RISK NUCLEAR TEST  . CESAREAN SECTION     x 4 in remote past  . CHOLECYSTECTOMY    . CORONARY  ARTERY BYPASS GRAFT  2003   by Dr. Servando Snare. LIMA to the LAD, free RIMA to the circumflex. Stress perfusion study December 2009 with no high-risk areas of ischemia. She has a well-preserved ejection fraction  . HYSTEROSCOPY W/D&C N/A 05/14/2014   Procedure: DILATATION AND CURETTAGE (no specimen); HYSTEROSCOPY;  Surgeon: Jonnie Kind, MD;  Location: [facility];  Service: Gynecology;  Laterality: N/A;  . INCISION AND DRAINAGE OF WOUND  04/17/2012   Procedure: IRRIGATION AND DEBRIDEMENT WOUND;  Surgeon: Theodoro Kos, DO;  Location: [place];  Service: Plastics;  Laterality: N/A;  OF ABDOMINAL WOUND, SURGICAL PREP AND PLACEMENT OF VAC, REMOVAL FOREHEAD SKIN LESION  . INCISION AND DRAINAGE OF WOUND N/A 04/24/2012   Procedure: IRRIGATION AND DEBRIDEMENT WOUND;  Surgeon: Theodoro Kos, DO;  Location: [place];  Service: Plastics;  Laterality: N/A;  I & D ABDOMINAL WOUND WITH VAC AND ACELL  . INCISION AND DRAINAGE OF WOUND N/A 05/01/2012   Procedure: IRRIGATION AND DEBRIDEMENT WOUND;  Surgeon: Theodoro Kos, DO;  Location: [place];  Service: Plastics;  Laterality: N/A;  WITH SURGICAL PREP AND PLACEMENT OF VAC  . INCISION AND DRAINAGE OF WOUND N/A 05/08/2012   Procedure: IRRIGATION AND DEBRIDEMENT OF ABD WOUND SURGICAL PREP AND PLACEMENT OF VAC ;  Surgeon: Theodoro Kos, DO;  Location: [facility];  Service: Plastics;  Laterality: N/A;  IRRIGATION AND DEBRIDEMENT OF ABD WOUND SURGICAL PREP AND PLACEMENT OF VAC   . INCISION AND DRAINAGE OF WOUND N/A 05/15/2012   Procedure: IRRIGATION AND DEBRIDEMENT OF ABDOMINAL WOUND WITH POSSIBLE SURGICAL PREP AND PLACEMENT OF VAC;  Surgeon: Theodoro Kos, DO;  Location: [place];  Service: Plastics;  Laterality: N/A;  . INCISION AND DRAINAGE OF WOUND N/A 05/22/2012   Procedure: IRRIGATION AND DEBRIDEMENT OF ABODOMINAL WOUND WITH  SURGICAL PREP AND VAC PLACEMENT;  Surgeon: Theodoro Kos, DO;   Location: [place];  Service: Plastics;  Laterality: N/A;  . INCISION AND DRAINAGE OF WOUND N/A 06/28/2012   Procedure: IRRIGATION AND DEBRIDEMENT OF ABDOMINAL ULCER SURGICAL PREP AND PLACEMENT OF ACELL AND VAC;  Surgeon: Theodoro Kos, DO;  Location: [place];  Service: Plastics;  Laterality: N/A;  . INCISIONAL HERNIA REPAIR  02/07/2012   Procedure: HERNIA REPAIR INCISIONAL;  Surgeon: Gayland Curry, MD,FACS;  Location: [place];  Service: General;;  Open, Primary repair, strangulated Incisional hernia.  Marland Kitchen LESION REMOVAL  04/17/2012   Procedure: LESION REMOVAL;  Surgeon: Theodoro Kos, DO;  Location: [place];  Service: Plastics;  Laterality: N/A;   CENTER OF FOREHEAD, REMOVAL FORHEAD SKIN LESION  . ORIF TOE FRACTURE Left 02/09/2017   Procedure: EXCISION TALAR HEAD, INTERNAL FIXATION MEDIAL COLUMN LEFT FOOT;  Surgeon: Newt Minion, MD;  Location: [place];  Service: Orthopedics;  Laterality: Left;  . POLYPECTOMY N/A 05/14/2014   Procedure: ENDOMETRIAL POLYPECTOMY;  Surgeon: Jonnie Kind, MD;  Location: [facility];  Service: Gynecology;  Laterality: N/A;    Family History  Problem Relation Age of Onset  . Heart attack Mother   . Hypertension Sister   . Diabetes Brother   . Birth defects Son        heart defect    Social History:  reports that she quit smoking about 5 years ago. Her smoking use included cigarettes. She has a 25.00 pack-year smoking history. She has never used smokeless tobacco. She reports that she does not drink alcohol or use drugs.  Allergies:  Allergies  Allergen Reactions  . Ace Inhibitors Cough    Medications: I have reviewed the patient's current medications.  Results for orders placed or performed during the hospital encounter of 06/19/17 (from the past 48 hour(s))  CBC with Differential/Platelet     Status: Abnormal   Collection Time: 06/19/17  6:55 PM  Result Value Ref Range   WBC 13.5 (H) 4.0 - 10.5 K/uL   RBC  4.34 3.87 - 5.11 MIL/uL   Hemoglobin 11.8 (L) 12.0 - 15.0 g/dL   HCT 38.3 36.0 - 46.0 %   MCV 88.2 78.0 - 100.0 fL   MCH 27.2 26.0 - 34.0 pg   MCHC 30.8 30.0 - 36.0 g/dL   RDW 16.8 (H) 11.5 - 15.5 %   Platelets 367 150 - 400 K/uL   Neutrophils Relative % 61 %   Neutro Abs 8.3 (H) 1.7 - 7.7 K/uL   Lymphocytes Relative 28 %   Lymphs Abs 3.7 0.7 - 4.0 K/uL   Monocytes Relative 8 %   Monocytes Absolute 1.1 (H) 0.1 - 1.0 K/uL   Eosinophils Relative 3 %   Eosinophils Absolute 0.4 0.0 - 0.7 K/uL   Basophils Relative 0 %   Basophils Absolute 0.0 0.0 - 0.1 K/uL    Comment: Performed at [facility]  [place] [address] [place], [place] [address]  Basic metabolic panel     Status: Abnormal   Collection Time: 06/19/17  6:55 PM  Result Value Ref Range   Sodium 139 135 - 145 mmol/L   Potassium 3.6 3.5 - 5.1 mmol/L   Chloride 100 (L) 101 - 111 mmol/L   CO2 27 22 - 32 mmol/L   Glucose, Bld 99 65 - 99 mg/dL   BUN 21 (H) 6 - 20 mg/dL   Creatinine, Ser 1.22 (H) 0.44 - 1.00 mg/dL   Calcium 9.2 8.9 - 10.3 mg/dL   GFR calc non Af Amer 46 (L) >60 mL/min   GFR calc Af Amer 53 (L) >60 mL/min    Comment: (NOTE) The eGFR has been calculated using the CKD EPI equation. This calculation has not been validated in all clinical situations. eGFR's persistently <60 mL/min signify possible Chronic Kidney Disease.    Anion gap 12 5 - 15    Comment: Performed at [facility], [place] [address] [place], [place] [id number]  I-Stat CG4 Lactic Acid, ED     Status: None   Collection Time: 06/19/17  8:29 PM  Result Value Ref Range   Lactic Acid, Venous 1.28 0.5 - 1.9 mmol/L  MRSA PCR Screening     Status: None   Collection Time: 06/20/17  2:53 AM  Result Value Ref Range   MRSA by PCR NEGATIVE NEGATIVE    Comment:        The GeneXpert MRSA Assay (FDA approved for NASAL specimens only), is one component of a comprehensive MRSA colonization surveillance program. It is  not intended to diagnose MRSA infection nor to guide or monitor treatment for MRSA infections. Performed at [facility], [place] [address] [place], [place] [address]   CBC     Status: Abnormal   Collection Time: 06/21/17  5:01 AM  Result Value Ref Range   WBC 10.0 4.0 - 10.5 K/uL   RBC 3.74 (L) 3.87 - 5.11 MIL/uL   Hemoglobin 10.0 (L) 12.0 - 15.0 g/dL   HCT 33.1 (L) 36.0 - 46.0 %   MCV 88.5 78.0 - 100.0 fL   MCH 26.7 26.0 - 34.0 pg   MCHC 30.2 30.0 - 36.0 g/dL   RDW 16.8 (H) 11.5 - 15.5 %   Platelets 293 150 - 400 K/uL    Comment: Performed at [facility], [place] [address] [place], [place] [address]  Basic metabolic panel     Status: Abnormal   Collection Time: 06/21/17  5:01 AM  Result Value Ref Range   Sodium 144 135 - 145 mmol/L   Potassium 4.0 3.5 - 5.1 mmol/L   Chloride 108 101 - 111 mmol/L   CO2 26 22 - 32 mmol/L   Glucose, Bld 88 65 - 99 mg/dL   BUN 20 6 - 20 mg/dL   Creatinine, Ser 0.99 0.44 - 1.00 mg/dL   Calcium 8.8 (L) 8.9 - 10.3 mg/dL   GFR calc non Af Amer 59 (L) >60 mL/min   GFR calc Af Amer >60 >60 mL/min    Comment: (NOTE) The eGFR has been calculated using the CKD EPI equation. This calculation has not been validated in all clinical situations. eGFR's persistently <60 mL/min signify possible Chronic Kidney Disease.    Anion gap 10 5 - 15    Comment: Performed at [facility], [place] [address] [place], [place] [address]  Magnesium  Status: None   Collection Time: 06/21/17  5:01 AM  Result Value Ref Range   Magnesium 2.0 1.7 - 2.4 mg/dL    Comment: Performed at [facility], [place] [address] [place], [place] [address]    Dg Knee Complete 4 Views Left  Result Date: 06/19/2017 CLINICAL DATA:  Below the knee amputation in February. Worsening pain, redness and bleeding. EXAM: LEFT KNEE - COMPLETE 4+ VIEW COMPARISON:  None. FINDINGS: Uncomplicated amputation margins of the proximal  diaphyses of the tibia and fibula. No unexpected air or gas in the soft tissues of the stump. No plain radiographic evidence of osteomyelitis. Chronic degenerative changes affect the knee joint. IMPRESSION: No unexpected finding in this patient status post below the knee amputation. Electronically Signed   By: Nelson Chimes M.D.   On: 06/19/2017 20:05    ROS Blood pressure (!) 164/60, pulse (!) 56, temperature 98.6 F (37 C), temperature source Oral, resp. rate 16, height 5' 6" (1.676 m), weight 119.3 kg (263 lb), SpO2 92 %. Physical Exam  Constitutional: She is oriented to person, place, and time.  Centrally obese female in NAD.   Cardiovascular: Normal rate.  Respiratory: Effort normal. No respiratory distress.  GI: Soft.  Small area of breakdown over the scar of the central abdominal wall from previous surgery.   Musculoskeletal:  The left BK distal stump incision line with dark brown-black eschar ~ 3 x 3 cm which is adherent .  Medial wound ~ 0.5 cm tunnels to~  6 cm.  Erythema of distal stump and edema.   Neurological: She is alert and oriented to person, place, and time.    Assessment/Plan: Celllulitis/wound of left BKA- improving on IV antibiotics. Could benefit from I&D in the OR with placement of Acell and VAC dressing to facilitate healing.  Will try to get on schedule this week.   Geneveive Furness,PA-C Plastic Surgery [phone]

## 2017-06-21 NOTE — H&P (Signed)
Julia West is an 62 y.o. female.   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  She was at the nursing facility when she noticed an increase in the drainage, pain and swelling of the stump.  She was brought to the ED by her family and then admitted for IV antibiotics.    Past Medical History:  Diagnosis Date  . Acute myocardial infarction    with a ruptured plaque in the circumflex in 2003  . Anxiety   . Cancer ([place])    basal cell face  . Complication of anesthesia    states low O2 sats post-op 11/13, always slow to awaken  . Coronary artery disease   . Degenerative joint disease   . Dyspnea    with activity- car to house - steps  . Heart murmur    "not to be concerned"  . HTN (hypertension)   . Hyperlipidemia LDL goal <70   . Internal and external hemorrhoids without complication   . Lymphedema    right side of body  . Lymphedema of arm    right  . Morbid obesity ([place])   . Surgical wound, non healing ABDOMINAL    has wound vac @ 125 mm Hg    Past Surgical History:  Procedure Laterality Date  . AMPUTATION Left 05/06/2017   Procedure: BELOW KNEE AMPUTATION;  Surgeon: Newt Minion, MD;  Location: [facility];  Service: Orthopedics;  Laterality: Left;  . APPLICATION OF WOUND VAC    . BOWEL RESECTION  02/07/2012   Procedure: SMALL BOWEL RESECTION;  Surgeon: Gayland Curry, MD,FACS;  Location: [facility];  Service: General;;  . CARDIOVASCULAR STRESS TEST  01-17-2012  DR HOCHREIN   LOW RISK NUCLEAR TEST  . CESAREAN SECTION     x 4 in remote past  . CHOLECYSTECTOMY    . CORONARY ARTERY BYPASS GRAFT  2003   by Dr. Servando Snare. LIMA to the LAD, free RIMA to the circumflex. Stress perfusion study December 2009 with no high-risk areas of ischemia. She has a well-preserved ejection fraction  . HYSTEROSCOPY W/D&C N/A 05/14/2014   Procedure: DILATATION AND CURETTAGE (no  specimen); HYSTEROSCOPY;  Surgeon: Jonnie Kind, MD;  Location: [facility];  Service: Gynecology;  Laterality: N/A;  . INCISION AND DRAINAGE OF WOUND  04/17/2012   Procedure: IRRIGATION AND DEBRIDEMENT WOUND;  Surgeon: Theodoro Kos, DO;  Location: [facility];  Service: Plastics;  Laterality: N/A;  OF ABDOMINAL WOUND, SURGICAL PREP AND PLACEMENT OF VAC, REMOVAL FOREHEAD SKIN LESION  . INCISION AND DRAINAGE OF WOUND N/A 04/24/2012   Procedure: IRRIGATION AND DEBRIDEMENT WOUND;  Surgeon: Theodoro Kos, DO;  Location: [facility];  Service: Plastics;  Laterality: N/A;  I & D ABDOMINAL WOUND WITH VAC AND ACELL  . INCISION AND DRAINAGE OF WOUND N/A 05/01/2012   Procedure: IRRIGATION AND DEBRIDEMENT WOUND;  Surgeon: Theodoro Kos, DO;  Location: [facility];  Service: Plastics;  Laterality: N/A;  WITH SURGICAL PREP AND PLACEMENT OF VAC  . INCISION AND DRAINAGE OF WOUND N/A 05/08/2012   Procedure: IRRIGATION AND DEBRIDEMENT OF ABD WOUND SURGICAL PREP AND PLACEMENT OF VAC ;  Surgeon: Theodoro Kos, DO;  Location: [facility];  Service: Plastics;  Laterality: N/A;  IRRIGATION AND DEBRIDEMENT OF ABD WOUND SURGICAL PREP AND PLACEMENT OF VAC   . INCISION  AND DRAINAGE OF WOUND N/A 05/15/2012   Procedure: IRRIGATION AND DEBRIDEMENT OF ABDOMINAL WOUND WITH POSSIBLE SURGICAL PREP AND PLACEMENT OF VAC;  Surgeon: Theodoro Kos, DO;  Location: [facility];  Service: Plastics;  Laterality: N/A;  . INCISION AND DRAINAGE OF WOUND N/A 05/22/2012   Procedure: IRRIGATION AND DEBRIDEMENT OF ABODOMINAL WOUND WITH  SURGICAL PREP AND VAC PLACEMENT;  Surgeon: Theodoro Kos, DO;  Location: [facility];  Service: Plastics;  Laterality: N/A;  . INCISION AND DRAINAGE OF WOUND N/A 06/28/2012   Procedure: IRRIGATION AND DEBRIDEMENT OF ABDOMINAL ULCER SURGICAL PREP AND PLACEMENT OF ACELL AND VAC;  Surgeon: Theodoro Kos, DO;  Location: [facility];  Service: Plastics;  Laterality: N/A;  . INCISIONAL HERNIA REPAIR  02/07/2012   Procedure: HERNIA REPAIR INCISIONAL;  Surgeon: Gayland Curry, MD,FACS;  Location: [facility];  Service: General;;  Open, Primary repair, strangulated Incisional hernia.  Marland Kitchen LESION REMOVAL  04/17/2012   Procedure: LESION REMOVAL;  Surgeon: Theodoro Kos, DO;  Location: [facility];  Service: Plastics;  Laterality: N/A;   CENTER OF FOREHEAD, REMOVAL FORHEAD SKIN LESION  . ORIF TOE FRACTURE Left 02/09/2017   Procedure: EXCISION TALAR HEAD, INTERNAL FIXATION MEDIAL COLUMN LEFT FOOT;  Surgeon: Newt Minion, MD;  Location: [facility];  Service: Orthopedics;  Laterality: Left;  . POLYPECTOMY N/A 05/14/2014   Procedure: ENDOMETRIAL POLYPECTOMY;  Surgeon: Jonnie Kind, MD;  Location: [facility];  Service: Gynecology;  Laterality: N/A;    Family History  Problem Relation Age of Onset  . Heart attack Mother   . Hypertension Sister   . Diabetes Brother   . Birth defects Son        heart defect   Social History:  reports that she quit smoking about 5 years ago. Her smoking use included cigarettes. She has a 25.00 pack-year smoking history. She has never used smokeless tobacco. She reports that she does not drink alcohol or use drugs.  Allergies:  Allergies  Allergen Reactions  . Ace Inhibitors Cough    Medications Prior to Admission  Medication Sig Dispense Refill  . aspirin 81 MG tablet Take 81 mg by mouth daily.     . diclofenac sodium (VOLTAREN) 1 % GEL Apply 2 g topically 3 (three) times daily.    . fenofibrate 160 MG tablet TAKE 1 TABLET BY MOUTH  DAILY FOR CHOLESTEROL AND  TRIGLYCERIDES (Patient taking differently: Take 160 mg by mouth daily. ) 90 tablet 1  . fexofenadine (ALLEGRA) 180 MG tablet Take 180 mg by mouth daily.    . furosemide (LASIX) 20 MG tablet TAKE 1 TABLET BY MOUTH  DAILY (Patient taking differently: Take 20 mg by mouth daily) 90 tablet 3  . gabapentin (NEURONTIN) 100 MG capsule Take  100 mg by mouth at bedtime.    Marland Kitchen HYDROcodone-acetaminophen (NORCO) 7.5-325 MG tablet Take 1 tablet by mouth every 6 (six) hours as needed for moderate pain.    Marland Kitchen loratadine (CLARITIN) 10 MG tablet Take 10 mg by mouth daily.    . Multiple Vitamins-Minerals (MULTIVITAMIN ADULT) TABS Take 1 tablet by mouth daily.    . nitroGLYCERIN (NITROSTAT) 0.4 MG SL tablet Place 1 tablet (0.4 mg total) under the tongue every 5 (five) minutes x 3 doses as needed for chest pain. If pain persist after call 911 25 tablet 6  . sertraline (ZOLOFT) 50 MG tablet TAKE 1 TABLET BY MOUTH EVERY DAY 30 tablet 0  . silver sulfADIAZINE (SILVADENE)  1 % cream Apply 1 application topically daily.    . traZODone (DESYREL) 150 MG tablet Take 150 mg by mouth at bedtime.    . valsartan-hydrochlorothiazide (DIOVAN-HCT) 160-12.5 MG tablet Take 1 tablet by mouth daily. 90 tablet 1  . HYDROcodone-acetaminophen (NORCO/VICODIN) 5-325 MG tablet Take 1-2 tablets by mouth every 6 (six) hours as needed for moderate pain or severe pain ((score 4 to 6)). (Patient not taking: Reported on 06/19/2017) 40 tablet 0    Results for orders placed or performed during the hospital encounter of 06/19/17 (from the past 48 hour(s))  Culture, blood (Routine X 2) w Reflex to ID Panel     Status: None (Preliminary result)   Collection Time: 06/19/17  6:55 PM  Result Value Ref Range   Specimen Description      BLOOD LEFT ANTECUBITAL Performed at [facility], [place] [address], [place], [place] [address]    Special Requests      BOTTLES DRAWN AEROBIC AND ANAEROBIC Blood Culture adequate volume Performed at [facility] [address], [place], [place] [address]    Culture      NO GROWTH 1 DAY Performed at [facility] [address] [place], [place] [address]    Report Status PENDING   CBC with Differential/Platelet     Status: Abnormal   Collection Time: 06/19/17  6:55 PM  Result Value Ref Range   WBC 13.5  (H) 4.0 - 10.5 K/uL   RBC 4.34 3.87 - 5.11 MIL/uL   Hemoglobin 11.8 (L) 12.0 - 15.0 g/dL   HCT 38.3 36.0 - 46.0 %   MCV 88.2 78.0 - 100.0 fL   MCH 27.2 26.0 - 34.0 pg   MCHC 30.8 30.0 - 36.0 g/dL   RDW 16.8 (H) 11.5 - 15.5 %   Platelets 367 150 - 400 K/uL   Neutrophils Relative % 61 %   Neutro Abs 8.3 (H) 1.7 - 7.7 K/uL   Lymphocytes Relative 28 %   Lymphs Abs 3.7 0.7 - 4.0 K/uL   Monocytes Relative 8 %   Monocytes Absolute 1.1 (H) 0.1 - 1.0 K/uL   Eosinophils Relative 3 %   Eosinophils Absolute 0.4 0.0 - 0.7 K/uL   Basophils Relative 0 %   Basophils Absolute 0.0 0.0 - 0.1 K/uL    Comment: Performed at [facility], [place] [address], [place], [place] [address]  Basic metabolic panel     Status: Abnormal   Collection Time: 06/19/17  6:55 PM  Result Value Ref Range   Sodium 139 135 - 145 mmol/L   Potassium 3.6 3.5 - 5.1 mmol/L   Chloride 100 (L) 101 - 111 mmol/L   CO2 27 22 - 32 mmol/L   Glucose, Bld 99 65 - 99 mg/dL   BUN 21 (H) 6 - 20 mg/dL   Creatinine, Ser 1.22 (H) 0.44 - 1.00 mg/dL   Calcium 9.2 8.9 - 10.3 mg/dL   GFR calc non Af Amer 46 (L) >60 mL/min   GFR calc Af Amer 53 (L) >60 mL/min    Comment: (NOTE) The eGFR has been calculated using the CKD EPI equation. This calculation has not been validated in all clinical situations. eGFR's persistently <60 mL/min signify possible Chronic Kidney Disease.    Anion gap 12 5 - 15    Comment: Performed at [facility], [place] [address] [place], [place] [address]  Culture, blood (Routine X 2) w Reflex to ID Panel  Status: None (Preliminary result)   Collection Time: 06/19/17  7:00 PM  Result Value Ref Range   Specimen Description      BLOOD LEFT ANTECUBITAL Performed at [facility], [place] [address] [place], [place] [address]    Special Requests      BOTTLES DRAWN AEROBIC AND ANAEROBIC Blood Culture adequate volume Performed at [facility]  [address] [place], [place] [address]    Culture      NO GROWTH 1 DAY Performed at [facility] [address], [place], [place] [address]    Report Status PENDING   I-Stat CG4 Lactic Acid, ED     Status: None   Collection Time: 06/19/17  8:29 PM  Result Value Ref Range   Lactic Acid, Venous 1.28 0.5 - 1.9 mmol/L  MRSA PCR Screening     Status: None   Collection Time: 06/20/17  2:53 AM  Result Value Ref Range   MRSA by PCR NEGATIVE NEGATIVE    Comment:        The GeneXpert MRSA Assay (FDA approved for NASAL specimens only), is one component of a comprehensive MRSA colonization surveillance program. It is not intended to diagnose MRSA infection nor to guide or monitor treatment for MRSA infections. Performed at [facility], [place] [address], [place], [place] [address]   HIV antibody (Routine Testing)     Status: None   Collection Time: 06/21/17  5:01 AM  Result Value Ref Range   HIV Screen 4th Generation wRfx Non Reactive Non Reactive    Comment: (NOTE) Performed At: [facility] [place], [place] [id number] Rush Farmer MD [id number] Performed at [facility], [place] [address] [place], [place] [address]   CBC     Status: Abnormal   Collection Time: 06/21/17  5:01 AM  Result Value Ref Range   WBC 10.0 4.0 - 10.5 K/uL   RBC 3.74 (L) 3.87 - 5.11 MIL/uL   Hemoglobin 10.0 (L) 12.0 - 15.0 g/dL   HCT 33.1 (L) 36.0 - 46.0 %   MCV 88.5 78.0 - 100.0 fL   MCH 26.7 26.0 - 34.0 pg   MCHC 30.2 30.0 - 36.0 g/dL   RDW 16.8 (H) 11.5 - 15.5 %   Platelets 293 150 - 400 K/uL    Comment: Performed at [facility], [place] [address] [place], [place] [address]  Basic metabolic panel     Status: Abnormal   Collection Time: 06/21/17  5:01 AM  Result Value Ref Range   Sodium 144 135 - 145 mmol/L   Potassium 4.0 3.5 - 5.1 mmol/L   Chloride 108 101 - 111 mmol/L   CO2 26 22 - 32 mmol/L   Glucose, Bld 88  65 - 99 mg/dL   BUN 20 6 - 20 mg/dL   Creatinine, Ser 0.99 0.44 - 1.00 mg/dL   Calcium 8.8 (L) 8.9 - 10.3 mg/dL   GFR calc non Af Amer 59 (L) >60 mL/min   GFR calc Af Amer >60 >60 mL/min    Comment: (NOTE) The eGFR has been calculated using the CKD EPI equation. This calculation has not been validated in all clinical situations. eGFR's persistently <60 mL/min signify possible Chronic Kidney Disease.    Anion gap 10 5 - 15    Comment: Performed at [facility], [place] [address] [place], [place] [address]  Magnesium     Status: None   Collection  Time: 06/21/17  5:01 AM  Result Value Ref Range   Magnesium 2.0 1.7 - 2.4 mg/dL    Comment: Performed at [facility], [place] [address], [place], [place] [address]   Dg Knee Complete 4 Views Left  Result Date: 06/19/2017 CLINICAL DATA:  Below the knee amputation in February. Worsening pain, redness and bleeding. EXAM: LEFT KNEE - COMPLETE 4+ VIEW COMPARISON:  None. FINDINGS: Uncomplicated amputation margins of the proximal diaphyses of the tibia and fibula. No unexpected air or gas in the soft tissues of the stump. No plain radiographic evidence of osteomyelitis. Chronic degenerative changes affect the knee joint. IMPRESSION: No unexpected finding in this patient status post below the knee amputation. Electronically Signed   By: Nelson Chimes M.D.   On: 06/19/2017 20:05    Review of Systems  Constitutional: Positive for chills and fever.  HENT: Negative.   Eyes: Negative.   Respiratory: Negative.   Gastrointestinal: Negative.   Genitourinary: Negative.   Musculoskeletal: Negative.   Skin: Negative.   Psychiatric/Behavioral: Negative.     Blood pressure (!) 154/53, pulse (!) 58, temperature 98 F (36.7 C), temperature source Oral, resp. rate 18, height '5\' 6"'$  (1.676 m), weight 119.3 kg (263 lb), SpO2 94 %. Physical Exam  Constitutional: She is oriented to person, place, and time. She appears well-developed.   HENT:  Head: Normocephalic and atraumatic.  Cardiovascular: Normal rate.  Respiratory: Effort normal.  GI: Soft.  Neurological: She is alert and oriented to person, place, and time.  Skin: Skin is warm. There is erythema.  Psychiatric: She has a normal mood and affect. Her behavior is normal. Thought content normal.     Assessment/Plan Plan for debridement of the left leg BKA and placement of ACell and VAC.  [place], DO 06/21/2017, 3:53 PM

## 2017-06-21 NOTE — Anesthesia Preprocedure Evaluation (Addendum)
Anesthesia Evaluation  Patient identified by MRN, date of birth, ID band Patient awake    Reviewed: Allergy & Precautions, NPO status , Patient's Chart, lab work & pertinent test results, reviewed documented beta blocker date and time   History of Anesthesia Complications (+) PROLONGED EMERGENCE and history of anesthetic complications ("low O2 sat after surgery ")  Airway Mallampati: III  TM Distance: >3 FB Neck ROM: Full    Dental  (+) Edentulous Upper, Edentulous Lower   Pulmonary neg COPD, former smoker,    breath sounds clear to auscultation       Cardiovascular hypertension, Pt. on medications and Pt. on home beta blockers + CAD, + Past MI, + CABG and + DOE   Rhythm:Regular Rate:Bradycardia  EKG - SB with LVH   Neuro/Psych PSYCHIATRIC DISORDERS Anxiety negative neurological ROS     GI/Hepatic Neg liver ROS, neg GERD  ,  Endo/Other  Morbid obesity  Renal/GU Renal InsufficiencyRenal disease     Musculoskeletal  (+) Arthritis ,   Abdominal (+) + obese,   Peds  Hematology   Anesthesia Other Findings Basal cell ca  Reproductive/Obstetrics                             Anesthesia Physical  Anesthesia Plan  ASA: III  Anesthesia Plan: MAC   Post-op Pain Management:  Regional for Post-op pain   Induction: Intravenous  PONV Risk Score and Plan: 3 and Ondansetron, Dexamethasone and Midazolam  Airway Management Planned:   Additional Equipment:   Intra-op Plan:   Post-operative Plan:   Informed Consent: I have reviewed the patients History and Physical, chart, labs and discussed the procedure including the risks, benefits and alternatives for the proposed anesthesia with the patient or authorized representative who has indicated his/her understanding and acceptance.   Dental advisory given  Plan Discussed with: CRNA  Anesthesia Plan Comments:        Anesthesia Quick  Evaluation

## 2017-06-22 ENCOUNTER — Encounter (HOSPITAL_COMMUNITY): Payer: Self-pay | Admitting: Plastic Surgery

## 2017-06-22 LAB — CREATININE, SERUM
CREATININE: 0.87 mg/dL (ref 0.44–1.00)
GFR calc Af Amer: >60 mL/min (ref >60)
GFR calc non Af Amer: >60 mL/min (ref >60)

## 2017-06-22 LAB — THYROID STIMULATING IMMUNOGLOBULIN: Thyroid Stimulating Immunoglob: <0.1 IU/L (ref 0.00–0.55)

## 2017-06-22 MED ORDER — AMOXICILLIN-POT CLAVULANATE 875-125 MG PO TABS: 1.0000 | ORAL_TABLET | Freq: Two times a day (BID) | ORAL | 0 refills | Status: AC

## 2017-06-22 MED ORDER — MUPIROCIN CALCIUM 2 % EX CREA
TOPICAL_CREAM | Freq: Every day | CUTANEOUS | Status: DC
  Administered 2017-06-22 – 2017-06-23 (×2): via TOPICAL
  Filled 2017-06-22: qty 15

## 2017-06-22 MED ORDER — SULFAMETHOXAZOLE-TRIMETHOPRIM 400-80 MG PO TABS: 1.0000 | ORAL_TABLET | Freq: Two times a day (BID) | ORAL | 0 refills | Status: DC

## 2017-06-22 NOTE — Discharge Instructions (Signed)

## 2017-06-22 NOTE — Anesthesia Postprocedure Evaluation (Signed)
Anesthesia Post Note  Patient: Julia West  Procedure(s) Performed: IRRIGATION AND DEBRIDEMENT OF LEFT LEG AMPUTATION SITE (Left Leg Lower)     Patient location during evaluation: PACU Anesthesia Type: MAC Level of consciousness: sedated and patient cooperative Pain management: pain level controlled Vital Signs Assessment: post-procedure vital signs reviewed and stable Respiratory status: spontaneous breathing Cardiovascular status: stable Anesthetic complications: no    Last Vitals:  Vitals:   06/22/17 1351 06/22/17 2219  BP: (!) 136/51 (!) 144/44  Pulse: (!) 52 (!) 47  Resp: 18 15  Temp: 36.9 C 36.6 C  SpO2: 94% 91%    Last Pain:  Vitals:   06/22/17 2219  TempSrc: Oral  PainSc:                  [name]

## 2017-06-22 NOTE — Care Management Important Message (Signed)
Important Message  Patient Details  Name: MAYSEL MCCOLM MRN: [id number] Date of Birth: Apr 27, 1955   Medicare Important Message Given:  Yes    Kerin Salen 06/22/2017, 10:39 AMImportant Message  Patient Details  Name: RASHEEN SCHEWE MRN: [id number] Date of Birth: [birth year]   Medicare Important Message Given:  Yes    Kerin Salen 06/22/2017, 10:37 AM

## 2017-06-22 NOTE — Progress Notes (Signed)
Pharmacy Antibiotic Note  Julia West is a 61 y.o. female admitted on 06/19/2017 with wound infection.  Pharmacy has been consulted for zosyn and vancomycin dosing.  Today, 06/22/2017:  D3 (full) abx  Cx remain clear  No fevers, WBC normal x 48 hr  SCr improved to baseline  Plan:  Continue Zosyn 3.375 g IV q8h (4 hour infusion).   Continue Vancomycin 1 Gm IV q24h  Daily Scr while on Vanc/Zosyn  F/u scr/cultures  Now that patient is s/p I&D in OR, consider removing MRSA and pseudomonal coverage. Rocephin/Flagyl will cover skin flora plus Gm negatives and anaerobes with minimal side effects.  Height: 5\' 6"  (167.6 cm) Weight: 263 lb (119.3 kg) IBW/kg (Calculated) : 59.3  Temp (24hrs), Avg:98.1 F (36.7 C), Min:97.6 F (36.4 C), Max:99.3 F (37.4 C)  Recent Labs  Lab 06/19/17 1855 06/19/17 2029 06/21/17 0501 06/22/17 0507  WBC 13.5*  --  10.0  --   CREATININE 1.22*  --  0.99 0.87  LATICACIDVEN  --  1.28  --   --     Estimated Creatinine Clearance: 85.9 mL/min (by C-G formula based on SCr of 0.87 mg/dL).    Allergies  Allergen Reactions  . Ace Inhibitors Cough    AAntimicrobials this admission: 4/7 zosyn >>  4/7 vancomcyin >>   Dose adjustments this admission:  Microbiology results: 4/7 BCx: ngtd 4/8 MRSA PCR: neg  Thank you for allowing pharmacy to be a part of this patient's care.  Reuel Boom, PharmD, BCPS [phone] 06/22/2017, 10:25 AM

## 2017-06-22 NOTE — Discharge Summary (Addendum)
Physician Discharge Summary  Julia West:[id number] DOB: 07-16-1955 DOA: 06/19/2017  PCP: [name], MD  Admit date: 06/19/2017 Discharge date: 06/23/2017  Admitted From: Skilled nursing facility Disposition: Skilled nursing facility  Recommendations for Outpatient Follow-up:  1. Follow up with PCP in 1-2 weeks 2. Please obtain BMP/CBC in one week   Home Health: No Equipment/Devices: Wound VAC  Discharge Condition: Stable CODE STATUS: Full Diet recommendation: Heart Healthy   Brief/Interim Summary:  #) Cellulitis complicating nonhealing surgical site after left BKA: Admission x-ray showed no evidence of ostium mellitus.  General surgery and plastic surgery were consulted.  Patient was taken to the OR on 06/21/2017 and had excision of left leg wound with evacuation of hematoma and placement of a cell powder and sheet and wound VAC placement.  She was covered empirically with vancomycin and Zosyn and discharged with Augmentin and Bactrim to complete a total of 10 days of antibiotics.  Her erythema and redness improved dramatically.  #) His coronary artery disease: Patient did report worsening lower extremity edema and remaining foot.  An echo was unchanged and showed a normal EF.  She was continued on her home aspirin and furosemide.  #) Hyperlipidemia/hypertension: Patient was continued on her home ARB and HCTZ.  Patient was continued on home fenofibrate.  #) Pain/psych: Patient was continued on home gabapentin, sertraline, trazodone.  Discharge Diagnoses:  Principal Problem:   Infection of amputation stump of left lower extremity ([place]) Active Problems:   Hyperlipemia   OBESITY, MORBID   Essential hypertension   CAD (coronary artery disease)   Wound dehiscence, surgical, sequela   S/P BKA (below knee amputation) unilateral, left (HCC)   Acquired absence of left leg below knee [facility])    Discharge Instructions  Discharge Instructions    Call MD for:  difficulty  breathing, headache or visual disturbances   Complete by:  As directed    Call MD for:  hives   Complete by:  As directed    Call MD for:  persistant dizziness or light-headedness   Complete by:  As directed    Call MD for:  persistant nausea and vomiting   Complete by:  As directed    Call MD for:  redness, tenderness, or signs of infection (pain, swelling, redness, odor or green/yellow discharge around incision site)   Complete by:  As directed    Call MD for:  severe uncontrolled pain   Complete by:  As directed    Call MD for:  temperature >100.4   Complete by:  As directed    Diet - low sodium heart healthy   Complete by:  As directed    Discharge instructions   Complete by:  As directed    Please take antibiotics as prescribed.  Please follow-up with your primary care doctor in 1 week.   Increase activity slowly   Complete by:  As directed      Allergies as of 06/23/2017      Reactions   Ace Inhibitors Cough      Medication List    TAKE these medications   amoxicillin-clavulanate 875-125 MG tablet Commonly known as:  AUGMENTIN Take 1 tablet by mouth 2 (two) times daily for 7 days.   aspirin 81 MG tablet Take 81 mg by mouth daily.   diclofenac sodium 1 % Gel Commonly known as:  VOLTAREN Apply 2 g topically 3 (three) times daily.   fenofibrate 160 MG tablet TAKE 1 TABLET BY MOUTH  DAILY FOR CHOLESTEROL  AND  TRIGLYCERIDES What changed:    how much to take  how to take this  when to take this  additional instructions   fexofenadine 180 MG tablet Commonly known as:  ALLEGRA Take 180 mg by mouth daily.   furosemide 20 MG tablet Commonly known as:  LASIX TAKE 1 TABLET BY MOUTH  DAILY What changed:    how much to take  how to take this  when to take this   gabapentin 100 MG capsule Commonly known as:  NEURONTIN Take 100 mg by mouth at bedtime.   HYDROcodone-acetaminophen 7.5-325 MG tablet Commonly known as:  NORCO Take 1 tablet by mouth every 6  (six) hours as needed for moderate pain.   HYDROcodone-acetaminophen 5-325 MG tablet Commonly known as:  NORCO/VICODIN Take 1-2 tablets by mouth every 6 (six) hours as needed for moderate pain or severe pain ((score 4 to 6)).   loratadine 10 MG tablet Commonly known as:  CLARITIN Take 10 mg by mouth daily.   MULTIVITAMIN ADULT Tabs Take 1 tablet by mouth daily.   mupirocin cream 2 % Commonly known as:  BACTROBAN Apply topically daily. Start taking on:  06/24/2017   nitroGLYCERIN 0.4 MG SL tablet Commonly known as:  NITROSTAT Place 1 tablet (0.4 mg total) under the tongue every 5 (five) minutes x 3 doses as needed for chest pain. If pain persist after call 911   sertraline 50 MG tablet Commonly known as:  ZOLOFT TAKE 1 TABLET BY MOUTH EVERY DAY   silver sulfADIAZINE 1 % cream Commonly known as:  SILVADENE Apply 1 application topically daily.   sulfamethoxazole-trimethoprim 400-80 MG tablet Commonly known as:  BACTRIM Take 1 tablet by mouth 2 (two) times daily.   traZODone 150 MG tablet Commonly known as:  DESYREL Take 150 mg by mouth at bedtime.   valsartan-hydrochlorothiazide 160-12.5 MG tablet Commonly known as:  DIOVAN-HCT Take 1 tablet by mouth daily.       Contact information for follow-up providers    Dillingham, Loel Lofty, DO In 1 week.   Specialty:  Plastic Surgery Contact information: [place] [address] [phone]            Contact information for after-discharge care    Destination    [facility] .   Service:  Skilled Nursing Contact information: [place] [address] [phone]                 Allergies  Allergen Reactions  . Ace Inhibitors Cough    Consultations:  Plastic surgery  General Surgery   Procedures/Studies: Dg Knee Complete 4 Views Left  Result Date: 06/19/2017 CLINICAL DATA:  Below the knee amputation in February. Worsening pain, redness and  bleeding. EXAM: LEFT KNEE - COMPLETE 4+ VIEW COMPARISON:  None. FINDINGS: Uncomplicated amputation margins of the proximal diaphyses of the tibia and fibula. No unexpected air or gas in the soft tissues of the stump. No plain radiographic evidence of osteomyelitis. Chronic degenerative changes affect the knee joint. IMPRESSION: No unexpected finding in this patient status post below the knee amputation. Electronically Signed   By: Nelson Chimes M.D.   On: 06/19/2017 20:05      Subjective:   Discharge Exam: Vitals:   06/22/17 2219 06/23/17 0517  BP: (!) 144/44 (!) 163/73  Pulse: (!) 47 (!) 50  Resp: 15 14  Temp: 97.8 F (36.6 C) 98.4 F (36.9 C)  SpO2: 91% 92%   Vitals:  06/22/17 1100 06/22/17 1351 06/22/17 2219 06/23/17 0517  BP: (!) 132/48 (!) 136/51 (!) 144/44 (!) 163/73  Pulse: 60 (!) 52 (!) 47 (!) 50  Resp:  18 15 14   Temp:  98.5 F (36.9 C) 97.8 F (36.6 C) 98.4 F (36.9 C)  TempSrc:  Oral Oral Oral  SpO2:  94% 91% 92%  Weight:      Height:        General exam: Appears calm and comfortable  Respiratory system: Clear to auscultation. Respiratory effort normal. Cardiovascular system: Distant heart sounds, regular rate and rhythm, no murmurs Gastrointestinal system: Soft, nondistended, plus bowel sounds, no rebound or guarding, noted to have numerous well-healed incisions Central nervous system: Alert and oriented. No focal neurological deficits. Extremities: Left BKA, bilateral lower extremity edema Skin: Over left BKA stump covered in dressing with wound vac Psychiatry: Judgement and insight appear normal. Mood & affect appropriate.      The results of significant diagnostics from this hospitalization (including imaging, microbiology, ancillary and laboratory) are listed below for reference.     Microbiology: Recent Results (from the past 240 hour(s))  Culture, blood (Routine X 2) w Reflex to ID Panel     Status: None (Preliminary result)   Collection Time:  06/19/17  6:55 PM  Result Value Ref Range Status   Specimen Description   Final    BLOOD LEFT ANTECUBITAL Performed at [facility] [address], [place], [place] [address]    Special Requests   Final    BOTTLES DRAWN AEROBIC AND ANAEROBIC Blood Culture adequate volume Performed at [facility] [address], [place], [place] [address]    Culture   Final    NO GROWTH 2 DAYS Performed at [facility] [address] [place], [place] [address]    Report Status PENDING  Incomplete  Culture, blood (Routine X 2) w Reflex to ID Panel     Status: None (Preliminary result)   Collection Time: 06/19/17  7:00 PM  Result Value Ref Range Status   Specimen Description   Final    BLOOD LEFT ANTECUBITAL Performed at [facility] [address], [place], [place] [address]    Special Requests   Final    BOTTLES DRAWN AEROBIC AND ANAEROBIC Blood Culture adequate volume Performed at [facility] [address], [place], [place] [address]    Culture   Final    NO GROWTH 2 DAYS Performed at [facility] [address], [place], [place] [address]    Report Status PENDING  Incomplete  MRSA PCR Screening     Status: None   Collection Time: 06/20/17  2:53 AM  Result Value Ref Range Status   MRSA by PCR NEGATIVE NEGATIVE Final    Comment:        The GeneXpert MRSA Assay (FDA approved for NASAL specimens only), is one component of a comprehensive MRSA colonization surveillance program. It is not intended to diagnose MRSA infection nor to guide or monitor treatment for MRSA infections. Performed at [facility], [place] [address], [place], [place] [address]      Labs: BNP (last 3 results) No results for input(s): BNP in the last 8760 hours. Basic Metabolic Panel: Recent Labs  Lab 06/19/17 1855 06/21/17 0501 06/22/17 0507 06/23/17 0504  NA 139 144  --  140  K 3.6 4.0  --  4.1   CL 100* 108  --  104  CO2 27 26  --  26  GLUCOSE 99 88  --  94  BUN 21* 20  --  23*  CREATININE 1.22* 0.99 0.87 0.93  CALCIUM 9.2 8.8*  --  9.0  MG  --  2.0  --  2.1   Liver Function Tests: No results for input(s): AST, ALT, ALKPHOS, BILITOT, PROT, ALBUMIN in the last 168 hours. No results for input(s): LIPASE, AMYLASE in the last 168 hours. No results for input(s): AMMONIA in the last 168 hours. CBC: Recent Labs  Lab 06/19/17 1855 06/21/17 0501 06/23/17 0504  WBC 13.5* 10.0 13.1*  NEUTROABS 8.3*  --   --   HGB 11.8* 10.0* 10.3*  HCT 38.3 33.1* 34.4*  MCV 88.2 88.5 88.9  PLT 367 293 338   Cardiac Enzymes: No results for input(s): CKTOTAL, CKMB, CKMBINDEX, TROPONINI in the last 168 hours. BNP: Invalid input(s): POCBNP CBG: No results for input(s): GLUCAP in the last 168 hours. D-Dimer No results for input(s): DDIMER in the last 72 hours. Hgb A1c No results for input(s): HGBA1C in the last 72 hours. Lipid Profile No results for input(s): CHOL, HDL, LDLCALC, TRIG, CHOLHDL, LDLDIRECT in the last 72 hours. Thyroid function studies No results for input(s): TSH, T4TOTAL, T3FREE, THYROIDAB in the last 72 hours.  Invalid input(s): FREET3 Anemia work up No results for input(s): VITAMINB12, FOLATE, FERRITIN, TIBC, IRON, RETICCTPCT in the last 72 hours. Urinalysis    Component Value Date/Time   COLORURINE STRAW (A) 05/07/2017 1751   APPEARANCEUR CLEAR 05/07/2017 1751   APPEARANCEUR Clear 12/10/2016 1203   LABSPEC 1.009 05/07/2017 1751   PHURINE 5.0 05/07/2017 1751   GLUCOSEU NEGATIVE 05/07/2017 1751   HGBUR NEGATIVE 05/07/2017 1751   BILIRUBINUR NEGATIVE 05/07/2017 1751   BILIRUBINUR Negative 12/10/2016 [place] 05/07/2017 1751   PROTEINUR NEGATIVE 05/07/2017 1751   UROBILINOGEN 0.2 05/10/2014 1039   NITRITE NEGATIVE 05/07/2017 1751   LEUKOCYTESUR NEGATIVE 05/07/2017 1751   LEUKOCYTESUR Trace (A) 12/10/2016 1203   Sepsis Labs Invalid input(s):  PROCALCITONIN,  WBC,  LACTICIDVEN Microbiology Recent Results (from the past 240 hour(s))  Culture, blood (Routine X 2) w Reflex to ID Panel     Status: None (Preliminary result)   Collection Time: 06/19/17  6:55 PM  Result Value Ref Range Status   Specimen Description   Final    BLOOD LEFT ANTECUBITAL Performed at [facility], [place] [address] [place], [place] [address]    Special Requests   Final    BOTTLES DRAWN AEROBIC AND ANAEROBIC Blood Culture adequate volume Performed at [facility] [address], [place], [place] [address]    Culture   Final    NO GROWTH 2 DAYS Performed at [facility] [address], [place], [place] [address]    Report Status PENDING  Incomplete  Culture, blood (Routine X 2) w Reflex to ID Panel     Status: None (Preliminary result)   Collection Time: 06/19/17  7:00 PM  Result Value Ref Range Status   Specimen Description   Final    BLOOD LEFT ANTECUBITAL Performed at [facility] [address] [place], [place] [address]    Special Requests   Final    BOTTLES DRAWN AEROBIC AND ANAEROBIC Blood Culture adequate volume Performed at [facility] [address], [place], [place] [address]    Culture   Final    NO GROWTH 2 DAYS Performed at [facility], [address], [place], [place] [address]    Report Status PENDING  Incomplete  MRSA PCR Screening     Status: None   Collection Time: 06/20/17  2:53 AM  Result Value Ref Range Status   MRSA by PCR NEGATIVE NEGATIVE Final    Comment:        The GeneXpert MRSA Assay (FDA approved for NASAL specimens only), is one component of a comprehensive MRSA colonization surveillance program. It is not intended to diagnose MRSA infection nor to guide or monitor treatment for MRSA infections. Performed at [facility], [place] [address], [place], [place] [address]      Time coordinating  discharge: Over 30 minutes  SIGNED:   Cristy Folks, MD  [facility] 06/23/2017, 11:59 AM   If 7PM-7AM, please contact night-coverage [email or web address] Password [email or web address]

## 2017-06-22 NOTE — Evaluation (Signed)
Physical Therapy Evaluation Patient Details Name: Julia West MRN: [id number] DOB: January 06, 1956 Today's Date: 06/22/2017   History of Present Illness  Ms. Whidbee is a 62 y/o female admitted on 06/18/17 for I and D and wound VAC placed to left BKA. >   Clinical Impression  The patient is very pleasant and motivated to return to rehab to improve to eventually return home. Pt admitted with above diagnosis. Pt currently with functional limitations due to the deficits listed below (see PT Problem List).  Pt will benefit from skilled PT to increase their independence and safety with mobility to allow discharge to the venue listed below.       Follow Up Recommendations SNF    Equipment Recommendations  None recommended by PT    Recommendations for Other Services       Precautions / Restrictions Precautions Precautions: Fall Precaution Comments: VAC      Mobility  Bed Mobility Overal bed mobility: Modified Independent;Independent                Transfers                 General transfer comment: NT, patient may be DC'd later today. patient verbally reviewed transfwers performed PTA   Ambulation/Gait                Stairs            Wheelchair Mobility    Modified Rankin (Stroke Patients Only)       Balance Overall balance assessment: Needs assistance Sitting-balance support: No upper extremity supported;Feet supported Sitting balance-Leahy Scale: Good                                       Pertinent Vitals/Pain Pain Assessment: 0-10 Pain Score: 3  Pain Location: left residual limb    Home Living Family/patient expects to be discharged to:: Skilled nursing facility                      Prior Function Level of Independence: Needs assistance   Gait / Transfers Assistance Needed: non ambulatory .  ADL's / Homemaking Assistance Needed: transsfers with scooting from bed to [facility] to Drop arm toilet riser at snf.         Hand Dominance        Extremity/Trunk Assessment   Upper Extremity Assessment Upper Extremity Assessment: Overall WFL for tasks assessed    Lower Extremity Assessment Lower Extremity Assessment: LLE deficits/detail LLE Deficits / Details: BKA       Communication      Cognition Arousal/Alertness: Awake/alert Behavior During Therapy: WFL for tasks assessed/performed Overall Cognitive Status: Within Functional Limits for tasks assessed                                        General Comments      Exercises     Assessment/Plan    PT Assessment Patient needs continued PT services  PT Problem List Decreased strength;Decreased knowledge of use of DME;Decreased activity tolerance;Pain;Decreased mobility       PT Treatment Interventions Functional mobility training;Therapeutic activities;Therapeutic exercise;Patient/family education    PT Goals (Current goals can be found in the Care Plan section)  Acute Rehab PT Goals Patient Stated Goal: to get my prosthetic and walk  PT Goal Formulation: With patient Time For Goal Achievement: 06/29/17 Potential to Achieve Goals: Good    Frequency Min 2X/week   Barriers to discharge        Co-evaluation               AM-PAC PT "6 Clicks" Daily Activity  Outcome Measure Difficulty turning over in bed (including adjusting bedclothes, sheets and blankets)?: None Difficulty moving from lying on back to sitting on the side of the bed? : None Difficulty sitting down on and standing up from a chair with arms (e.g., wheelchair, bedside commode, etc,.)?: Unable Help needed moving to and from a bed to chair (including a wheelchair)?: Total Help needed walking in hospital room?: Total Help needed climbing 3-5 steps with a railing? : Total 6 Click Score: 12    End of Session   Activity Tolerance: Patient tolerated treatment well Patient left: in bed(sitting on bed edge) Nurse Communication: Mobility  status PT Visit Diagnosis: Unsteadiness on feet (R26.81)    Time: 1500-1530 PT Time Calculation (min) (ACUTE ONLY): 30 min   Charges:   PT Evaluation $PT Eval Low Complexity: 1 Low PT Treatments $Therapeutic Activity: 8-22 mins   PT G CodesTresa Endo PT [phone]   Claretha Cooper 06/22/2017, 3:39 PM

## 2017-06-22 NOTE — Clinical Social Work Note (Signed)
Clinical Social Work Assessment  Patient Details  Name: Julia West MRN: [id number] Date of Birth: 1955/11/22  Date of referral:  06/22/17               Reason for consult:  Facility Placement                Permission sought to share information with:  Facility Art therapist granted to share information::  Yes, Verbal Permission Granted  Name::      Agricultural consultant::  [facility]  Relationship::  Daughter   Contact Information:   [phone]  Housing/Transportation Living arrangements for the past 2 months:  [place] of Information:  Patient Patient Interpreter Needed:  None Criminal Activity/Legal Involvement Pertinent to Current Situation/Hospitalization:  No - Comment as needed Significant Relationships:  Adult Children Lives with:  Facility Resident Do you feel safe going back to the place where you live?    Need for family participation in patient care: Yes (Dependent with mobility at this time.   Care giving concerns:   -poor healing status of transtibial amputation for left on 2/22 who was brought in from rehab for stump infection.   Social Worker assessment / plan:  Patient resident at [facility] for [employer]. Patient admitted to [facility] on 2/27 and has been there since.  Patient admitted for treatment of wound on the former left below knee amputation. -Patient will discharge with a wound vac. Facility aware. Patient plan is to return to [facility],she understands she will be in her Insurance copay days starting 4/16. Patient reports since being at SNF she has appealed twice for insurance to continue covering her stay.   Patient will need updated physical therapy evaluation before discharging today.- Patient nurse has ordered for PT to see her.   Patient states she has been learning to transfer and pivot into a wheel chair. She reports her next goal is to learn to stand and use a walker and transfer safely  into a car.    Plan: SNF  Employment status:    Insurance information:  Medicare PT Recommendations:  [place] / Referral to community resources:  [place]  Patient/Family's Response to care:  Agreeable and Responding to care.   Patient/Family's Understanding of and Emotional Response to Diagnosis, Current Treatment, and Prognosis: "Physcial therapy has been going great." Patient explain her progress at the skilled facility, current treatment plan hope to use a  Prosthetic leg one day. Patient daughter [name] is very involved in patient care and has a good understanding her diagnosis.   Emotional Assessment Appearance:  Appears stated age Attitude/Demeanor/Rapport:    Affect (typically observed):  Accepting Orientation:  Oriented to Self, Oriented to Place, Oriented to  Time, Oriented to Situation Alcohol / Substance use:  Not Applicable Psych involvement (Current and /or in the community):  No (Comment)  Discharge Needs  Concerns to be addressed:  Discharge Planning Concerns Readmission within the last 30 days:  No Current discharge risk:  Dependent with Mobility Barriers to Discharge:  Continued Medical Work up, [place], LCSW 06/22/2017, 11:38 AM

## 2017-06-22 NOTE — Progress Notes (Signed)
[facility] is requesting updated PT evaluation for patient due to patient hospitalization. The patient was currently in an appeal period after requesting additional days and is inquiring about the patient change in condition.The facility reports the patient only has 7 days left in her SNF benefits to use. Patient is aware of this. The facility will start insurance authorization once the patient PT notes have been submitted by CSW.   Per nurse PT is working with the patient at this time.  Nurse and Physician informed. Patient and daughter [name] informed.  CSW will continue to follow for placement needs.  Kathrin Greathouse, Latanya Presser, MSW Clinical Social Worker  [phone] 06/22/2017  3:25 PM

## 2017-06-22 NOTE — [place] FL2 (Signed)
[place] MEDICAID FL2 LEVEL OF CARE SCREENING TOOL     IDENTIFICATION  Patient Name: Julia West Birthdate: [birth year] Sex: female Admission Date (Current Location): 06/19/2017  [facility] and [place] Number:  Herbalist and Address:  [facility],  [place] [address] [place]      Provider Number: [id number]  Attending Physician Name and Address:  Cristy Folks, MD  Relative Name and Phone Number:       Current Level of Care: Hospital Recommended Level of Care: [place] Prior Approval Number:    Date Approved/Denied:   PASRR Number: [id number]  Discharge Plan: SNF    Current Diagnoses: Patient Active Problem List   Diagnosis Date Noted  . Infection of amputation stump of left lower extremity ([place]) 06/19/2017  . Acquired absence of left leg below knee ([place]) 05/19/2017  . Bradycardia 05/07/2017  . Depression 05/07/2017  . Lymphedema 05/07/2017  . S/P BKA (below knee amputation) unilateral, left ([place]) 05/06/2017  . Gangrene of left foot ([place])   . Wound dehiscence, surgical, sequela   . Charcot foot due to diabetes mellitus ([place]) 02/09/2017  . 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 Post-menopausal bleeding 04/15/2014  . Endometrial polyp 04/09/2014  . Ventral hernia 03/06/2012  . Open abdominal wall wound 03/06/2012  . Hyperlipemia 11/12/2008  . OBESITY, MORBID 11/12/2008  . Essential hypertension 11/12/2008  . CAD (coronary artery disease) 11/12/2008    Orientation RESPIRATION BLADDER Height & Weight     Self, Time, Situation, Place  Normal Continent Weight: 263 lb (119.3 kg) Height:  5\' 6"  (167.6 cm)  BEHAVIORAL SYMPTOMS/MOOD NEUROLOGICAL BOWEL NUTRITION STATUS      Continent Diet(carb modified diet)  AMBULATORY STATUS COMMUNICATION OF NEEDS Skin   Extensive Assist Verbally PU Stage and Appropriate Care, Surgical wounds   Wound vac- mid abdomen (non pressure wound) and L  leg (left leg BKA incision)     Wound type:Nonhealing surgical site to left BKA. Erythema and breakdown has increased since recent addition of stump shrinker.  Patient has decreased sensation in this extremity and has no pain with wound assessment and  packing today.  Pressure Injury POA: NA Measurement: Eschar to left  anterior leg below surgical incision site:  3 cm x 3.4 cm intact eschar 0.3 cm opening to right distal end of incision line, extends 6 cm  Along incision line. Bleeds with assessment.  Wound WPY:KDXIPJ Drainage (amount, consistency, odor) bleeding Periwound:erythema, improved and scattered eschar  Dressing procedure/placement/frequency:  Left BKA site: Cleanse opening to right distal incision line with NS.  Pack with Iodoform packing strip (extends 6 cm).  Santyl to eschar along incision line. Cover with NS moist 2x2.  Wrap with kelrix and tape.  Secure with ace wrap.  Change daily.                      Personal Care Assistance Level of Assistance  Bathing, Feeding, Dressing Bathing Assistance: Limited assistance Feeding assistance: Independent Dressing Assistance: Limited assistance     Functional Limitations Info  Sight, Hearing, Speech Sight Info: Adequate Hearing Info: Adequate Speech Info: Adequate    SPECIAL CARE FACTORS FREQUENCY  PT (By licensed PT), OT (By licensed OT)     PT Frequency: 5x OT Frequency: 5x            Contractures Contractures Info: Not present    Additional Factors Info  Code Status, Allergies  Code Status Info: full code Allergies Info: ace inhibitors           Current Medications (06/22/2017):  This is the current hospital active medication list Current Facility-Administered Medications  Medication Dose Route Frequency Provider Last Rate Last Dose  . acetaminophen (TYLENOL) tablet 650 mg  650 mg Oral Q6H PRN Dillingham, Loel Lofty, DO       Or  . acetaminophen (TYLENOL) suppository 650 mg  650 mg Rectal Q6H PRN  Dillingham, Loel Lofty, DO      . aspirin EC tablet 81 mg  81 mg Oral Daily Dillingham, Claire S, DO   81 mg at 06/22/17 1115  . buPROPion [facility]) tablet 50 mg  50 mg Oral Daily Dillingham, Claire S, DO   50 mg at 06/22/17 1116  . Chlorhexidine Gluconate Cloth 2 % PADS 6 each  6 each Topical Once Dillingham, Loel Lofty, DO      . collagenase (SANTYL) ointment   Topical Daily Dillingham, Loel Lofty, DO      . diclofenac sodium (VOLTAREN) 1 % transdermal gel 2 g  2 g Topical TID Wallace Going, DO   2 g at 06/21/17 2203  . docusate sodium (COLACE) capsule 50 mg  50 mg Oral Daily PRN Dillingham, Loel Lofty, DO      . enoxaparin (LOVENOX) injection 60 mg  60 mg Subcutaneous Daily Dillingham, Claire S, DO   60 mg at 06/22/17 1117  . fenofibrate tablet 160 mg  160 mg Oral Daily Dillingham, Loel Lofty, DO   160 mg at 06/22/17 1116  . gabapentin (NEURONTIN) capsule 100 mg  100 mg Oral QHS Dillingham, Loel Lofty, DO   100 mg at 06/21/17 2201  . irbesartan (AVAPRO) tablet 150 mg  150 mg Oral Daily Dillingham, Claire S, DO   150 mg at 06/21/17 1019   And  . hydrochlorothiazide (MICROZIDE) capsule 12.5 mg  12.5 mg Oral Daily Dillingham, Claire S, DO   12.5 mg at 06/21/17 1019  . HYDROcodone-acetaminophen (NORCO/VICODIN) 5-325 MG per tablet 1-2 tablet  1-2 tablet Oral Q6H PRN Wallace Going, DO   2 tablet at 06/22/17 [id number]  . loratadine (CLARITIN) tablet 10 mg  10 mg Oral Daily Dillingham, Loel Lofty, DO   10 mg at 06/22/17 1115  . multivitamin with minerals tablet 1 tablet  1 tablet Oral Daily Dillingham, Loel Lofty, DO   1 tablet at 06/22/17 1115  . ondansetron (ZOFRAN) tablet 4 mg  4 mg Oral Q6H PRN Dillingham, Loel Lofty, DO       Or  . ondansetron (ZOFRAN) injection 4 mg  4 mg Intravenous Q6H PRN Dillingham, Loel Lofty, DO      . piperacillin-tazobactam (ZOSYN) IVPB 3.375 g  3.375 g Intravenous Q8H Dillingham, Loel Lofty, DO   Stopped at 06/22/17 0859  . sertraline (ZOLOFT) tablet 50 mg  50 mg Oral Daily  Dillingham, Claire S, DO   50 mg at 06/22/17 1116  . traZODone (DESYREL) tablet 150 mg  150 mg Oral QHS Dillingham, Loel Lofty, DO   150 mg at 06/21/17 2202  . vancomycin (VANCOCIN) IVPB 1000 mg/200 mL premix  1,000 mg Intravenous Q24H Wallace Going, DO   Stopped at 06/21/17 2124     Discharge Medications: Please see discharge summary for a list of discharge medications.  Relevant Imaging Results:  Relevant Lab Results:   Additional Information SS#: [id number], LCSW

## 2017-06-23 ENCOUNTER — Ambulatory Visit (INDEPENDENT_AMBULATORY_CARE_PROVIDER_SITE_OTHER): Payer: Medicare Other | Admitting: Orthopedic Surgery

## 2017-06-23 DIAGNOSIS — Z89512 Acquired absence of left leg below knee: Secondary | ICD-10-CM | POA: Diagnosis not present

## 2017-06-23 DIAGNOSIS — G546 Phantom limb syndrome with pain: Secondary | ICD-10-CM | POA: Diagnosis not present

## 2017-06-23 DIAGNOSIS — T8149XA Infection following a procedure, other surgical site, initial encounter: Secondary | ICD-10-CM | POA: Diagnosis not present

## 2017-06-23 DIAGNOSIS — M25561 Pain in right knee: Secondary | ICD-10-CM | POA: Diagnosis not present

## 2017-06-23 DIAGNOSIS — R2689 Other abnormalities of gait and mobility: Secondary | ICD-10-CM | POA: Diagnosis not present

## 2017-06-23 DIAGNOSIS — T8744 Infection of amputation stump, left lower extremity: Secondary | ICD-10-CM | POA: Diagnosis not present

## 2017-06-23 DIAGNOSIS — I10 Essential (primary) hypertension: Secondary | ICD-10-CM | POA: Diagnosis not present

## 2017-06-23 DIAGNOSIS — R278 Other lack of coordination: Secondary | ICD-10-CM | POA: Diagnosis not present

## 2017-06-23 DIAGNOSIS — M6281 Muscle weakness (generalized): Secondary | ICD-10-CM | POA: Diagnosis not present

## 2017-06-23 DIAGNOSIS — S81839A Puncture wound without foreign body, unspecified lower leg, initial encounter: Secondary | ICD-10-CM | POA: Diagnosis not present

## 2017-06-23 DIAGNOSIS — L8992 Pressure ulcer of unspecified site, stage 2: Secondary | ICD-10-CM | POA: Diagnosis not present

## 2017-06-23 DIAGNOSIS — M79662 Pain in left lower leg: Secondary | ICD-10-CM | POA: Diagnosis not present

## 2017-06-23 LAB — CBC
HCT: 34.4 % — ABNORMAL LOW (ref 36.0–46.0)
Hemoglobin: 10.3 g/dL — ABNORMAL LOW (ref 12.0–15.0)
MCH: 26.6 pg (ref 26.0–34.0)
MCHC: 29.9 g/dL — ABNORMAL LOW (ref 30.0–36.0)
MCV: 88.9 fL (ref 78.0–100.0)
Platelets: 338 K/uL (ref 150–400)
RBC: 3.87 MIL/uL (ref 3.87–5.11)
RDW: 16.7 % — ABNORMAL HIGH (ref 11.5–15.5)
WBC: 13.1 10*3/uL — ABNORMAL HIGH (ref 4.0–10.5)

## 2017-06-23 LAB — BASIC METABOLIC PANEL
Anion gap: 10 (ref 5–15)
BUN: 23 mg/dL — ABNORMAL HIGH (ref 6–20)
CO2: 26 mmol/L (ref 22–32)
Calcium: 9 mg/dL (ref 8.9–10.3)
Chloride: 104 mmol/L (ref 101–111)
Creatinine, Ser: 0.93 mg/dL (ref 0.44–1.00)
GFR calc Af Amer: >60 mL/min (ref >60)
GFR calc non Af Amer: >60 mL/min (ref >60)
Glucose, Bld: 94 mg/dL (ref 65–99)
Potassium: 4.1 mmol/L (ref 3.5–5.1)
Sodium: 140 mmol/L (ref 135–145)

## 2017-06-23 LAB — MAGNESIUM: Magnesium: 2.1 mg/dL (ref 1.7–2.4)

## 2017-06-23 MED ORDER — MUPIROCIN CALCIUM 2 % EX CREA: TOPICAL_CREAM | Freq: Every day | CUTANEOUS | 0 refills | Status: DC

## 2017-06-23 NOTE — Progress Notes (Addendum)
[facility] Authorization Pending. Authorization received.  Updated d/c Summary sent via [place]. Patient will have Lunch and then transport to [place] by [facility]  Nurse given number to call report.   Kathrin Greathouse, Latanya Presser, MSW Clinical Social Worker  [phone] 06/23/2017  12:00 PM

## 2017-06-23 NOTE — Progress Notes (Signed)
Pt alert and oriented.  Tolerating diet.  Report was called to [facility].  Pt was d/cd to [place].

## 2017-06-24 DIAGNOSIS — T8149XA Infection following a procedure, other surgical site, initial encounter: Secondary | ICD-10-CM | POA: Diagnosis not present

## 2017-06-24 DIAGNOSIS — Z89512 Acquired absence of left leg below knee: Secondary | ICD-10-CM | POA: Diagnosis not present

## 2017-06-24 DIAGNOSIS — I10 Essential (primary) hypertension: Secondary | ICD-10-CM | POA: Diagnosis not present

## 2017-06-25 LAB — CULTURE, BLOOD (ROUTINE X 2)
Culture: NO GROWTH
Culture: NO GROWTH
SPECIAL REQUESTS: ADEQUATE
Special Requests: ADEQUATE

## 2017-06-27 ENCOUNTER — Encounter (HOSPITAL_COMMUNITY): Payer: Self-pay | Admitting: Plastic Surgery

## 2017-07-04 DIAGNOSIS — Z87891 Personal history of nicotine dependence: Secondary | ICD-10-CM | POA: Diagnosis not present

## 2017-07-04 DIAGNOSIS — E785 Hyperlipidemia, unspecified: Secondary | ICD-10-CM | POA: Diagnosis not present

## 2017-07-04 DIAGNOSIS — T8189XA Other complications of procedures, not elsewhere classified, initial encounter: Secondary | ICD-10-CM | POA: Diagnosis not present

## 2017-07-04 DIAGNOSIS — I2510 Atherosclerotic heart disease of native coronary artery without angina pectoris: Secondary | ICD-10-CM | POA: Diagnosis not present

## 2017-07-04 DIAGNOSIS — R2681 Unsteadiness on feet: Secondary | ICD-10-CM | POA: Diagnosis not present

## 2017-07-04 DIAGNOSIS — T8789 Other complications of amputation stump: Secondary | ICD-10-CM | POA: Diagnosis not present

## 2017-07-04 DIAGNOSIS — T8781 Dehiscence of amputation stump: Secondary | ICD-10-CM | POA: Diagnosis not present

## 2017-07-04 DIAGNOSIS — M869 Osteomyelitis, unspecified: Secondary | ICD-10-CM | POA: Diagnosis not present

## 2017-07-04 DIAGNOSIS — I25810 Atherosclerosis of coronary artery bypass graft(s) without angina pectoris: Secondary | ICD-10-CM | POA: Diagnosis not present

## 2017-07-04 DIAGNOSIS — Z951 Presence of aortocoronary bypass graft: Secondary | ICD-10-CM | POA: Diagnosis not present

## 2017-07-04 DIAGNOSIS — S81009A Unspecified open wound, unspecified knee, initial encounter: Secondary | ICD-10-CM | POA: Diagnosis not present

## 2017-07-04 DIAGNOSIS — I10 Essential (primary) hypertension: Secondary | ICD-10-CM | POA: Diagnosis not present

## 2017-07-05 ENCOUNTER — Ambulatory Visit (INDEPENDENT_AMBULATORY_CARE_PROVIDER_SITE_OTHER): Payer: Medicare Other | Admitting: Orthopedic Surgery

## 2017-07-05 ENCOUNTER — Telehealth (INDEPENDENT_AMBULATORY_CARE_PROVIDER_SITE_OTHER): Payer: Self-pay

## 2017-07-05 NOTE — Telephone Encounter (Signed)
Patient would like to know if she can be worked in for Thursday, 07/07/17 with Dr. Sharol Given.  Cb# is [phone].  Please advise.  Thank you.

## 2017-07-06 NOTE — Telephone Encounter (Signed)
Please call pt and make appt for tomorrow morning.

## 2017-07-06 NOTE — Telephone Encounter (Signed)
Patient called back this morning asking if she could be seen tomorrow, she states she is at [facility] and doesn't want them to touch her wound, even though they told her it isn't healing correctly and she needs to be seen by Dr. Sharol Given. She said the facility canceled her appointment for this week since they didn't have transportation for her and she didn't want to be brought by ambulance. CB # [phone]

## 2017-07-07 ENCOUNTER — Encounter (INDEPENDENT_AMBULATORY_CARE_PROVIDER_SITE_OTHER): Payer: Self-pay | Admitting: Orthopedic Surgery

## 2017-07-07 ENCOUNTER — Ambulatory Visit (INDEPENDENT_AMBULATORY_CARE_PROVIDER_SITE_OTHER): Payer: Medicare Other | Admitting: Orthopedic Surgery

## 2017-07-07 DIAGNOSIS — T8131XS Disruption of external operation (surgical) wound, not elsewhere classified, sequela: Secondary | ICD-10-CM

## 2017-07-07 DIAGNOSIS — Z89512 Acquired absence of left leg below knee: Secondary | ICD-10-CM

## 2017-07-07 NOTE — Progress Notes (Signed)
Office Visit Note   Patient: Julia West           Date of Birth: 11-19-1955           MRN: [id number] Visit Date: 07/07/2017              Requested by: [name], MD [place], [place] [address] PCP: [name], MD  Chief Complaint  Patient presents with  . Left Leg - Pain      HPI: Patient is seen in follow-up status post left below the knee amputation she has had wound dehiscence she has had revision surgery she did go to [facility] for skilled nursing she states she used up her 100 days and then went to inpatient rehabilitation at [facility].  Patient states that [facility] could not get the wound VAC to function properly and that she had worsening of the ulcerative due to nonfunctioning of the wound VAC.  Patient states she is discharged to home at this time.  Assessment & Plan: Visit Diagnoses:  1. Acquired absence of left leg below knee (HCC)   2. Dehiscence of operative wound, sequela     Plan: [facility] health wound VAC to be changed 3 times a week she is to work with physical [facility] from [facility] we will reevaluate in 3 weeks.  Follow-Up Instructions: Return in about 3 weeks (around 07/28/2017).   Ortho Exam  Patient is alert, oriented, no adenopathy, well-dressed, normal affect, normal respiratory effort. Examination patient has dermatitis lateral to the medial ulcer.  Patient states that this was due to the dressing that was applied.  The wound has approximately 75% healthy granulation tissue 25% fibrinous tissue.  The wound is approximately 5 cm deep and 5 cm in diameter.  There is no purulence no cellulitis no tenderness to palpation no odor no signs of infection.  No indication for antibiotics.  Patient was given a application for handicap parking.  Imaging: No results found. No images are attached to the encounter.  Labs: Lab Results  Component Value Date   HGBA1C 5.4 05/09/2017   HGBA1C 5.6 02/07/2017   REPTSTATUS 06/25/2017 FINAL 06/19/2017   GRAMSTAIN  04/24/2012    NO WBC SEEN NO SQUAMOUS EPITHELIAL CELLS SEEN MODERATE GRAM POSITIVE COCCI IN PAIRS RARE GRAM NEGATIVE RODS   GRAMSTAIN  04/24/2012    NO WBC SEEN NO SQUAMOUS EPITHELIAL CELLS SEEN MODERATE GRAM POSITIVE COCCI IN PAIRS RARE GRAM NEGATIVE RODS   CULT  06/19/2017    NO GROWTH 5 DAYS Performed at [facility], [place] [address], [place], [place] [address]    [place] 04/24/2012    @LABSALLVALUES (HGBA1)@  There is no height or weight on file to calculate BMI.  Orders:  No orders of the defined types were placed in this encounter.  No orders of the defined types were placed in this encounter.    Procedures: No procedures performed  Clinical Data: No additional findings.  ROS:  All other systems negative, except as noted in the HPI. Review of Systems  Objective: Vital Signs: There were no vitals taken for this visit.  Specialty Comments:  No specialty comments available.  PMFS History: Patient Active Problem List   Diagnosis Date Noted  . Infection of amputation stump of left lower extremity ([place]) 06/19/2017  . Acquired absence of left leg below knee ([place]) 05/19/2017  . Bradycardia 05/07/2017  . Depression 05/07/2017  .  Lymphedema 05/07/2017  . S/P BKA (below knee amputation) unilateral, left ([place]) 05/06/2017  . Gangrene of left foot ([place])   . Wound dehiscence, surgical, sequela   . Charcot foot due to diabetes mellitus ([place]) 02/09/2017  . 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 Post-menopausal bleeding 04/15/2014  . Endometrial polyp 04/09/2014  . Ventral hernia 03/06/2012  . Open abdominal wall wound 03/06/2012  . Hyperlipemia 11/12/2008  . OBESITY, MORBID 11/12/2008  . Essential hypertension 11/12/2008  . CAD (coronary artery disease) 11/12/2008   Past Medical History:  Diagnosis Date  . Acute myocardial  infarction    with a ruptured plaque in the circumflex in 2003  . Anxiety   . Cancer ([place])    basal cell face  . Complication of anesthesia    states low O2 sats post-op 11/13, always slow to awaken  . Coronary artery disease   . Degenerative joint disease   . Dyspnea    with activity- car to house - steps  . Heart murmur    "not to be concerned"  . HTN (hypertension)   . Hyperlipidemia LDL goal <70   . Internal and external hemorrhoids without complication   . Lymphedema    right side of body  . Lymphedema of arm    right  . Morbid obesity ([place])   . Surgical wound, non healing ABDOMINAL    has wound vac @ 125 mm Hg    Family History  Problem Relation Age of Onset  . Heart attack Mother   . Hypertension Sister   . Diabetes Brother   . Birth defects Son        heart defect    Past Surgical History:  Procedure Laterality Date  . AMPUTATION Left 05/06/2017   Procedure: BELOW KNEE AMPUTATION;  Surgeon: Newt Minion, MD;  Location: [facility];  Service: Orthopedics;  Laterality: Left;  . APPLICATION OF WOUND VAC    . BOWEL RESECTION  02/07/2012   Procedure: SMALL BOWEL RESECTION;  Surgeon: Gayland Curry, MD,FACS;  Location: [facility];  Service: General;;  . CARDIOVASCULAR STRESS TEST  01-17-2012  DR HOCHREIN   LOW RISK NUCLEAR TEST  . CESAREAN SECTION     x 4 in remote past  . CHOLECYSTECTOMY    . CORONARY ARTERY BYPASS GRAFT  2003   by Dr. Servando Snare. LIMA to the LAD, free RIMA to the circumflex. Stress perfusion study December 2009 with no high-risk areas of ischemia. She has a well-preserved ejection fraction  . HYSTEROSCOPY W/D&C N/A 05/14/2014   Procedure: DILATATION AND CURETTAGE (no specimen); HYSTEROSCOPY;  Surgeon: Jonnie Kind, MD;  Location: [facility];  Service: Gynecology;  Laterality: N/A;  . I&D EXTREMITY Left 06/21/2017   Procedure: IRRIGATION AND DEBRIDEMENT OF LEFT LEG AMPUTATION SITE;  Surgeon: Wallace Going, DO;  Location: [facility];  Service: Plastics;   Laterality: Left;  . INCISION AND DRAINAGE OF WOUND  04/17/2012   Procedure: IRRIGATION AND DEBRIDEMENT WOUND;  Surgeon: Theodoro Kos, DO;  Location: [facility];  Service: Plastics;  Laterality: N/A;  OF ABDOMINAL WOUND, SURGICAL PREP AND PLACEMENT OF VAC, REMOVAL FOREHEAD SKIN LESION  . INCISION AND DRAINAGE OF WOUND N/A 04/24/2012   Procedure: IRRIGATION AND DEBRIDEMENT WOUND;  Surgeon: Theodoro Kos, DO;  Location: [facility];  Service: Plastics;  Laterality: N/A;  I & D ABDOMINAL WOUND WITH VAC AND ACELL  . INCISION AND DRAINAGE OF WOUND N/A 05/01/2012  Procedure: IRRIGATION AND DEBRIDEMENT WOUND;  Surgeon: Theodoro Kos, DO;  Location: [facility];  Service: Plastics;  Laterality: N/A;  WITH SURGICAL PREP AND PLACEMENT OF VAC  . INCISION AND DRAINAGE OF WOUND N/A 05/08/2012   Procedure: IRRIGATION AND DEBRIDEMENT OF ABD WOUND SURGICAL PREP AND PLACEMENT OF VAC ;  Surgeon: Theodoro Kos, DO;  Location: [facility];  Service: Plastics;  Laterality: N/A;  IRRIGATION AND DEBRIDEMENT OF ABD WOUND SURGICAL PREP AND PLACEMENT OF VAC   . INCISION AND DRAINAGE OF WOUND N/A 05/15/2012   Procedure: IRRIGATION AND DEBRIDEMENT OF ABDOMINAL WOUND WITH POSSIBLE SURGICAL PREP AND PLACEMENT OF VAC;  Surgeon: Theodoro Kos, DO;  Location: [facility];  Service: Plastics;  Laterality: N/A;  . INCISION AND DRAINAGE OF WOUND N/A 05/22/2012   Procedure: IRRIGATION AND DEBRIDEMENT OF ABODOMINAL WOUND WITH  SURGICAL PREP AND VAC PLACEMENT;  Surgeon: Theodoro Kos, DO;  Location: [facility];  Service: Plastics;  Laterality: N/A;  . INCISION AND DRAINAGE OF WOUND N/A 06/28/2012   Procedure: IRRIGATION AND DEBRIDEMENT OF ABDOMINAL ULCER SURGICAL PREP AND PLACEMENT OF ACELL AND VAC;  Surgeon: Theodoro Kos, DO;  Location: [facility];  Service: Plastics;  Laterality: N/A;  . INCISIONAL HERNIA REPAIR  02/07/2012   Procedure:  HERNIA REPAIR INCISIONAL;  Surgeon: Gayland Curry, MD,FACS;  Location: [facility];  Service: General;;  Open, Primary repair, strangulated Incisional hernia.  Marland Kitchen LESION REMOVAL  04/17/2012   Procedure: LESION REMOVAL;  Surgeon: Theodoro Kos, DO;  Location: [facility];  Service: Plastics;  Laterality: N/A;   CENTER OF FOREHEAD, REMOVAL FORHEAD SKIN LESION  . ORIF TOE FRACTURE Left 02/09/2017   Procedure: EXCISION TALAR HEAD, INTERNAL FIXATION MEDIAL COLUMN LEFT FOOT;  Surgeon: Newt Minion, MD;  Location: [facility];  Service: Orthopedics;  Laterality: Left;  . POLYPECTOMY N/A 05/14/2014   Procedure: ENDOMETRIAL POLYPECTOMY;  Surgeon: Jonnie Kind, MD;  Location: [facility];  Service: Gynecology;  Laterality: N/A;   Social History   Occupational History  . Occupation: Estate manager/land agent    Comment: In the [employer]  Tobacco Use  . Smoking status: Former Smoker    Packs/day: 1.00    Years: 25.00    Pack years: 25.00    Types: Cigarettes    Last attempt to quit: 11/17/2011    Years since quitting: 5.6  . Smokeless tobacco: Never Used  Substance and Sexual Activity  . Alcohol use: No  . Drug use: No  . Sexual activity: Yes    Birth control/protection: Post-menopausal

## 2017-07-08 DIAGNOSIS — Z7982 Long term (current) use of aspirin: Secondary | ICD-10-CM | POA: Diagnosis not present

## 2017-07-08 DIAGNOSIS — E785 Hyperlipidemia, unspecified: Secondary | ICD-10-CM | POA: Diagnosis not present

## 2017-07-08 DIAGNOSIS — Z4789 Encounter for other orthopedic aftercare: Secondary | ICD-10-CM | POA: Diagnosis not present

## 2017-07-08 DIAGNOSIS — Z87891 Personal history of nicotine dependence: Secondary | ICD-10-CM | POA: Diagnosis not present

## 2017-07-08 DIAGNOSIS — I2510 Atherosclerotic heart disease of native coronary artery without angina pectoris: Secondary | ICD-10-CM | POA: Diagnosis not present

## 2017-07-08 DIAGNOSIS — E11610 Type 2 diabetes mellitus with diabetic neuropathic arthropathy: Secondary | ICD-10-CM | POA: Diagnosis not present

## 2017-07-08 DIAGNOSIS — T8781 Dehiscence of amputation stump: Secondary | ICD-10-CM | POA: Diagnosis not present

## 2017-07-08 DIAGNOSIS — Z791 Long term (current) use of non-steroidal anti-inflammatories (NSAID): Secondary | ICD-10-CM | POA: Diagnosis not present

## 2017-07-08 DIAGNOSIS — Z89512 Acquired absence of left leg below knee: Secondary | ICD-10-CM | POA: Diagnosis not present

## 2017-07-08 DIAGNOSIS — I10 Essential (primary) hypertension: Secondary | ICD-10-CM | POA: Diagnosis not present

## 2017-07-08 DIAGNOSIS — Z79891 Long term (current) use of opiate analgesic: Secondary | ICD-10-CM | POA: Diagnosis not present

## 2017-07-08 DIAGNOSIS — R2681 Unsteadiness on feet: Secondary | ICD-10-CM | POA: Diagnosis not present

## 2017-07-09 DIAGNOSIS — Z791 Long term (current) use of non-steroidal anti-inflammatories (NSAID): Secondary | ICD-10-CM | POA: Diagnosis not present

## 2017-07-09 DIAGNOSIS — Z7982 Long term (current) use of aspirin: Secondary | ICD-10-CM | POA: Diagnosis not present

## 2017-07-09 DIAGNOSIS — Z79891 Long term (current) use of opiate analgesic: Secondary | ICD-10-CM | POA: Diagnosis not present

## 2017-07-09 DIAGNOSIS — T8781 Dehiscence of amputation stump: Secondary | ICD-10-CM | POA: Diagnosis not present

## 2017-07-09 DIAGNOSIS — Z89512 Acquired absence of left leg below knee: Secondary | ICD-10-CM | POA: Diagnosis not present

## 2017-07-09 DIAGNOSIS — E785 Hyperlipidemia, unspecified: Secondary | ICD-10-CM | POA: Diagnosis not present

## 2017-07-09 DIAGNOSIS — I10 Essential (primary) hypertension: Secondary | ICD-10-CM | POA: Diagnosis not present

## 2017-07-09 DIAGNOSIS — I2510 Atherosclerotic heart disease of native coronary artery without angina pectoris: Secondary | ICD-10-CM | POA: Diagnosis not present

## 2017-07-09 DIAGNOSIS — Z87891 Personal history of nicotine dependence: Secondary | ICD-10-CM | POA: Diagnosis not present

## 2017-07-11 DIAGNOSIS — Z79891 Long term (current) use of opiate analgesic: Secondary | ICD-10-CM | POA: Diagnosis not present

## 2017-07-11 DIAGNOSIS — I2510 Atherosclerotic heart disease of native coronary artery without angina pectoris: Secondary | ICD-10-CM | POA: Diagnosis not present

## 2017-07-11 DIAGNOSIS — Z791 Long term (current) use of non-steroidal anti-inflammatories (NSAID): Secondary | ICD-10-CM | POA: Diagnosis not present

## 2017-07-11 DIAGNOSIS — T8781 Dehiscence of amputation stump: Secondary | ICD-10-CM | POA: Diagnosis not present

## 2017-07-11 DIAGNOSIS — Z89512 Acquired absence of left leg below knee: Secondary | ICD-10-CM | POA: Diagnosis not present

## 2017-07-11 DIAGNOSIS — Z87891 Personal history of nicotine dependence: Secondary | ICD-10-CM | POA: Diagnosis not present

## 2017-07-11 DIAGNOSIS — E785 Hyperlipidemia, unspecified: Secondary | ICD-10-CM | POA: Diagnosis not present

## 2017-07-11 DIAGNOSIS — Z7982 Long term (current) use of aspirin: Secondary | ICD-10-CM | POA: Diagnosis not present

## 2017-07-11 DIAGNOSIS — I10 Essential (primary) hypertension: Secondary | ICD-10-CM | POA: Diagnosis not present

## 2017-07-12 ENCOUNTER — Encounter (INDEPENDENT_AMBULATORY_CARE_PROVIDER_SITE_OTHER): Payer: Self-pay

## 2017-07-12 ENCOUNTER — Ambulatory Visit (INDEPENDENT_AMBULATORY_CARE_PROVIDER_SITE_OTHER): Payer: Medicare Other | Admitting: Orthopedic Surgery

## 2017-07-12 DIAGNOSIS — T8189XA Other complications of procedures, not elsewhere classified, initial encounter: Secondary | ICD-10-CM | POA: Diagnosis not present

## 2017-07-12 DIAGNOSIS — S81009A Unspecified open wound, unspecified knee, initial encounter: Secondary | ICD-10-CM | POA: Diagnosis not present

## 2017-07-13 DIAGNOSIS — I10 Essential (primary) hypertension: Secondary | ICD-10-CM | POA: Diagnosis not present

## 2017-07-13 DIAGNOSIS — E785 Hyperlipidemia, unspecified: Secondary | ICD-10-CM | POA: Diagnosis not present

## 2017-07-13 DIAGNOSIS — I2510 Atherosclerotic heart disease of native coronary artery without angina pectoris: Secondary | ICD-10-CM | POA: Diagnosis not present

## 2017-07-13 DIAGNOSIS — Z89512 Acquired absence of left leg below knee: Secondary | ICD-10-CM | POA: Diagnosis not present

## 2017-07-13 DIAGNOSIS — Z7982 Long term (current) use of aspirin: Secondary | ICD-10-CM | POA: Diagnosis not present

## 2017-07-13 DIAGNOSIS — Z791 Long term (current) use of non-steroidal anti-inflammatories (NSAID): Secondary | ICD-10-CM | POA: Diagnosis not present

## 2017-07-13 DIAGNOSIS — Z79891 Long term (current) use of opiate analgesic: Secondary | ICD-10-CM | POA: Diagnosis not present

## 2017-07-13 DIAGNOSIS — Z87891 Personal history of nicotine dependence: Secondary | ICD-10-CM | POA: Diagnosis not present

## 2017-07-13 DIAGNOSIS — T8781 Dehiscence of amputation stump: Secondary | ICD-10-CM | POA: Diagnosis not present

## 2017-07-14 DIAGNOSIS — Z79891 Long term (current) use of opiate analgesic: Secondary | ICD-10-CM | POA: Diagnosis not present

## 2017-07-14 DIAGNOSIS — E785 Hyperlipidemia, unspecified: Secondary | ICD-10-CM | POA: Diagnosis not present

## 2017-07-14 DIAGNOSIS — Z89512 Acquired absence of left leg below knee: Secondary | ICD-10-CM | POA: Diagnosis not present

## 2017-07-14 DIAGNOSIS — T8781 Dehiscence of amputation stump: Secondary | ICD-10-CM | POA: Diagnosis not present

## 2017-07-14 DIAGNOSIS — Z7982 Long term (current) use of aspirin: Secondary | ICD-10-CM | POA: Diagnosis not present

## 2017-07-14 DIAGNOSIS — Z791 Long term (current) use of non-steroidal anti-inflammatories (NSAID): Secondary | ICD-10-CM | POA: Diagnosis not present

## 2017-07-14 DIAGNOSIS — Z87891 Personal history of nicotine dependence: Secondary | ICD-10-CM | POA: Diagnosis not present

## 2017-07-14 DIAGNOSIS — I2510 Atherosclerotic heart disease of native coronary artery without angina pectoris: Secondary | ICD-10-CM | POA: Diagnosis not present

## 2017-07-14 DIAGNOSIS — I10 Essential (primary) hypertension: Secondary | ICD-10-CM | POA: Diagnosis not present

## 2017-07-15 DIAGNOSIS — Z87891 Personal history of nicotine dependence: Secondary | ICD-10-CM | POA: Diagnosis not present

## 2017-07-15 DIAGNOSIS — I2510 Atherosclerotic heart disease of native coronary artery without angina pectoris: Secondary | ICD-10-CM | POA: Diagnosis not present

## 2017-07-15 DIAGNOSIS — I10 Essential (primary) hypertension: Secondary | ICD-10-CM | POA: Diagnosis not present

## 2017-07-15 DIAGNOSIS — E785 Hyperlipidemia, unspecified: Secondary | ICD-10-CM | POA: Diagnosis not present

## 2017-07-15 DIAGNOSIS — T8781 Dehiscence of amputation stump: Secondary | ICD-10-CM | POA: Diagnosis not present

## 2017-07-15 DIAGNOSIS — Z7982 Long term (current) use of aspirin: Secondary | ICD-10-CM | POA: Diagnosis not present

## 2017-07-15 DIAGNOSIS — Z79891 Long term (current) use of opiate analgesic: Secondary | ICD-10-CM | POA: Diagnosis not present

## 2017-07-15 DIAGNOSIS — Z89512 Acquired absence of left leg below knee: Secondary | ICD-10-CM | POA: Diagnosis not present

## 2017-07-15 DIAGNOSIS — Z791 Long term (current) use of non-steroidal anti-inflammatories (NSAID): Secondary | ICD-10-CM | POA: Diagnosis not present

## 2017-07-18 ENCOUNTER — Ambulatory Visit: Payer: Medicare Other | Admitting: Family Medicine

## 2017-07-18 DIAGNOSIS — I2510 Atherosclerotic heart disease of native coronary artery without angina pectoris: Secondary | ICD-10-CM | POA: Diagnosis not present

## 2017-07-18 DIAGNOSIS — E785 Hyperlipidemia, unspecified: Secondary | ICD-10-CM | POA: Diagnosis not present

## 2017-07-18 DIAGNOSIS — T8781 Dehiscence of amputation stump: Secondary | ICD-10-CM | POA: Diagnosis not present

## 2017-07-18 DIAGNOSIS — Z79891 Long term (current) use of opiate analgesic: Secondary | ICD-10-CM | POA: Diagnosis not present

## 2017-07-18 DIAGNOSIS — Z89512 Acquired absence of left leg below knee: Secondary | ICD-10-CM | POA: Diagnosis not present

## 2017-07-18 DIAGNOSIS — I10 Essential (primary) hypertension: Secondary | ICD-10-CM | POA: Diagnosis not present

## 2017-07-18 DIAGNOSIS — Z7982 Long term (current) use of aspirin: Secondary | ICD-10-CM | POA: Diagnosis not present

## 2017-07-18 DIAGNOSIS — Z791 Long term (current) use of non-steroidal anti-inflammatories (NSAID): Secondary | ICD-10-CM | POA: Diagnosis not present

## 2017-07-18 DIAGNOSIS — Z87891 Personal history of nicotine dependence: Secondary | ICD-10-CM | POA: Diagnosis not present

## 2017-07-19 DIAGNOSIS — Z87891 Personal history of nicotine dependence: Secondary | ICD-10-CM | POA: Diagnosis not present

## 2017-07-19 DIAGNOSIS — Z89512 Acquired absence of left leg below knee: Secondary | ICD-10-CM | POA: Diagnosis not present

## 2017-07-19 DIAGNOSIS — I2510 Atherosclerotic heart disease of native coronary artery without angina pectoris: Secondary | ICD-10-CM | POA: Diagnosis not present

## 2017-07-19 DIAGNOSIS — E785 Hyperlipidemia, unspecified: Secondary | ICD-10-CM | POA: Diagnosis not present

## 2017-07-19 DIAGNOSIS — T8789 Other complications of amputation stump: Secondary | ICD-10-CM | POA: Diagnosis not present

## 2017-07-19 DIAGNOSIS — I10 Essential (primary) hypertension: Secondary | ICD-10-CM | POA: Diagnosis not present

## 2017-07-19 DIAGNOSIS — Z79891 Long term (current) use of opiate analgesic: Secondary | ICD-10-CM | POA: Diagnosis not present

## 2017-07-19 DIAGNOSIS — Z7982 Long term (current) use of aspirin: Secondary | ICD-10-CM | POA: Diagnosis not present

## 2017-07-19 DIAGNOSIS — T8781 Dehiscence of amputation stump: Secondary | ICD-10-CM | POA: Diagnosis not present

## 2017-07-19 DIAGNOSIS — Z791 Long term (current) use of non-steroidal anti-inflammatories (NSAID): Secondary | ICD-10-CM | POA: Diagnosis not present

## 2017-07-20 DIAGNOSIS — T8781 Dehiscence of amputation stump: Secondary | ICD-10-CM | POA: Diagnosis not present

## 2017-07-20 DIAGNOSIS — Z87891 Personal history of nicotine dependence: Secondary | ICD-10-CM | POA: Diagnosis not present

## 2017-07-20 DIAGNOSIS — I10 Essential (primary) hypertension: Secondary | ICD-10-CM | POA: Diagnosis not present

## 2017-07-20 DIAGNOSIS — Z791 Long term (current) use of non-steroidal anti-inflammatories (NSAID): Secondary | ICD-10-CM | POA: Diagnosis not present

## 2017-07-20 DIAGNOSIS — I2510 Atherosclerotic heart disease of native coronary artery without angina pectoris: Secondary | ICD-10-CM | POA: Diagnosis not present

## 2017-07-20 DIAGNOSIS — Z79891 Long term (current) use of opiate analgesic: Secondary | ICD-10-CM | POA: Diagnosis not present

## 2017-07-20 DIAGNOSIS — Z7982 Long term (current) use of aspirin: Secondary | ICD-10-CM | POA: Diagnosis not present

## 2017-07-20 DIAGNOSIS — E785 Hyperlipidemia, unspecified: Secondary | ICD-10-CM | POA: Diagnosis not present

## 2017-07-20 DIAGNOSIS — Z89512 Acquired absence of left leg below knee: Secondary | ICD-10-CM | POA: Diagnosis not present

## 2017-07-21 ENCOUNTER — Ambulatory Visit (INDEPENDENT_AMBULATORY_CARE_PROVIDER_SITE_OTHER): Payer: Medicare Other | Admitting: Orthopedic Surgery

## 2017-07-21 DIAGNOSIS — T8789 Other complications of amputation stump: Secondary | ICD-10-CM | POA: Diagnosis not present

## 2017-07-22 DIAGNOSIS — T8781 Dehiscence of amputation stump: Secondary | ICD-10-CM | POA: Diagnosis not present

## 2017-07-22 DIAGNOSIS — Z79891 Long term (current) use of opiate analgesic: Secondary | ICD-10-CM | POA: Diagnosis not present

## 2017-07-22 DIAGNOSIS — Z89512 Acquired absence of left leg below knee: Secondary | ICD-10-CM | POA: Diagnosis not present

## 2017-07-22 DIAGNOSIS — I10 Essential (primary) hypertension: Secondary | ICD-10-CM | POA: Diagnosis not present

## 2017-07-22 DIAGNOSIS — Z7982 Long term (current) use of aspirin: Secondary | ICD-10-CM | POA: Diagnosis not present

## 2017-07-22 DIAGNOSIS — Z87891 Personal history of nicotine dependence: Secondary | ICD-10-CM | POA: Diagnosis not present

## 2017-07-22 DIAGNOSIS — I2510 Atherosclerotic heart disease of native coronary artery without angina pectoris: Secondary | ICD-10-CM | POA: Diagnosis not present

## 2017-07-22 DIAGNOSIS — Z791 Long term (current) use of non-steroidal anti-inflammatories (NSAID): Secondary | ICD-10-CM | POA: Diagnosis not present

## 2017-07-22 DIAGNOSIS — E785 Hyperlipidemia, unspecified: Secondary | ICD-10-CM | POA: Diagnosis not present

## 2017-07-25 DIAGNOSIS — Z7982 Long term (current) use of aspirin: Secondary | ICD-10-CM | POA: Diagnosis not present

## 2017-07-25 DIAGNOSIS — E785 Hyperlipidemia, unspecified: Secondary | ICD-10-CM | POA: Diagnosis not present

## 2017-07-25 DIAGNOSIS — I2510 Atherosclerotic heart disease of native coronary artery without angina pectoris: Secondary | ICD-10-CM | POA: Diagnosis not present

## 2017-07-25 DIAGNOSIS — Z791 Long term (current) use of non-steroidal anti-inflammatories (NSAID): Secondary | ICD-10-CM | POA: Diagnosis not present

## 2017-07-25 DIAGNOSIS — Z87891 Personal history of nicotine dependence: Secondary | ICD-10-CM | POA: Diagnosis not present

## 2017-07-25 DIAGNOSIS — I10 Essential (primary) hypertension: Secondary | ICD-10-CM | POA: Diagnosis not present

## 2017-07-25 DIAGNOSIS — Z89512 Acquired absence of left leg below knee: Secondary | ICD-10-CM | POA: Diagnosis not present

## 2017-07-25 DIAGNOSIS — Z79891 Long term (current) use of opiate analgesic: Secondary | ICD-10-CM | POA: Diagnosis not present

## 2017-07-25 DIAGNOSIS — T8781 Dehiscence of amputation stump: Secondary | ICD-10-CM | POA: Diagnosis not present

## 2017-07-26 DIAGNOSIS — T8781 Dehiscence of amputation stump: Secondary | ICD-10-CM | POA: Diagnosis not present

## 2017-07-26 DIAGNOSIS — Z89512 Acquired absence of left leg below knee: Secondary | ICD-10-CM | POA: Diagnosis not present

## 2017-07-26 DIAGNOSIS — Z7982 Long term (current) use of aspirin: Secondary | ICD-10-CM | POA: Diagnosis not present

## 2017-07-26 DIAGNOSIS — I10 Essential (primary) hypertension: Secondary | ICD-10-CM | POA: Diagnosis not present

## 2017-07-26 DIAGNOSIS — Z79891 Long term (current) use of opiate analgesic: Secondary | ICD-10-CM | POA: Diagnosis not present

## 2017-07-26 DIAGNOSIS — Z87891 Personal history of nicotine dependence: Secondary | ICD-10-CM | POA: Diagnosis not present

## 2017-07-26 DIAGNOSIS — E785 Hyperlipidemia, unspecified: Secondary | ICD-10-CM | POA: Diagnosis not present

## 2017-07-26 DIAGNOSIS — Z791 Long term (current) use of non-steroidal anti-inflammatories (NSAID): Secondary | ICD-10-CM | POA: Diagnosis not present

## 2017-07-26 DIAGNOSIS — I2510 Atherosclerotic heart disease of native coronary artery without angina pectoris: Secondary | ICD-10-CM | POA: Diagnosis not present

## 2017-07-27 DIAGNOSIS — I10 Essential (primary) hypertension: Secondary | ICD-10-CM | POA: Diagnosis not present

## 2017-07-27 DIAGNOSIS — Z791 Long term (current) use of non-steroidal anti-inflammatories (NSAID): Secondary | ICD-10-CM | POA: Diagnosis not present

## 2017-07-27 DIAGNOSIS — E785 Hyperlipidemia, unspecified: Secondary | ICD-10-CM | POA: Diagnosis not present

## 2017-07-27 DIAGNOSIS — T8781 Dehiscence of amputation stump: Secondary | ICD-10-CM | POA: Diagnosis not present

## 2017-07-27 DIAGNOSIS — Z89512 Acquired absence of left leg below knee: Secondary | ICD-10-CM | POA: Diagnosis not present

## 2017-07-27 DIAGNOSIS — Z79891 Long term (current) use of opiate analgesic: Secondary | ICD-10-CM | POA: Diagnosis not present

## 2017-07-27 DIAGNOSIS — Z7982 Long term (current) use of aspirin: Secondary | ICD-10-CM | POA: Diagnosis not present

## 2017-07-27 DIAGNOSIS — I2510 Atherosclerotic heart disease of native coronary artery without angina pectoris: Secondary | ICD-10-CM | POA: Diagnosis not present

## 2017-07-27 DIAGNOSIS — Z87891 Personal history of nicotine dependence: Secondary | ICD-10-CM | POA: Diagnosis not present

## 2017-07-28 ENCOUNTER — Encounter (INDEPENDENT_AMBULATORY_CARE_PROVIDER_SITE_OTHER): Payer: Self-pay | Admitting: Orthopedic Surgery

## 2017-07-28 ENCOUNTER — Ambulatory Visit (INDEPENDENT_AMBULATORY_CARE_PROVIDER_SITE_OTHER): Payer: Medicare Other | Admitting: Orthopedic Surgery

## 2017-07-28 DIAGNOSIS — Z791 Long term (current) use of non-steroidal anti-inflammatories (NSAID): Secondary | ICD-10-CM | POA: Diagnosis not present

## 2017-07-28 DIAGNOSIS — I10 Essential (primary) hypertension: Secondary | ICD-10-CM | POA: Diagnosis not present

## 2017-07-28 DIAGNOSIS — Z87891 Personal history of nicotine dependence: Secondary | ICD-10-CM | POA: Diagnosis not present

## 2017-07-28 DIAGNOSIS — I2510 Atherosclerotic heart disease of native coronary artery without angina pectoris: Secondary | ICD-10-CM | POA: Diagnosis not present

## 2017-07-28 DIAGNOSIS — T8131XS Disruption of external operation (surgical) wound, not elsewhere classified, sequela: Secondary | ICD-10-CM

## 2017-07-28 DIAGNOSIS — Z89512 Acquired absence of left leg below knee: Secondary | ICD-10-CM | POA: Diagnosis not present

## 2017-07-28 DIAGNOSIS — Z79891 Long term (current) use of opiate analgesic: Secondary | ICD-10-CM | POA: Diagnosis not present

## 2017-07-28 DIAGNOSIS — E785 Hyperlipidemia, unspecified: Secondary | ICD-10-CM | POA: Diagnosis not present

## 2017-07-28 DIAGNOSIS — T8781 Dehiscence of amputation stump: Secondary | ICD-10-CM | POA: Diagnosis not present

## 2017-07-28 DIAGNOSIS — Z7982 Long term (current) use of aspirin: Secondary | ICD-10-CM | POA: Diagnosis not present

## 2017-07-28 MED ORDER — HYDROCODONE-ACETAMINOPHEN 5-325 MG PO TABS: 1.0000 | ORAL_TABLET | Freq: Four times a day (QID) | ORAL | 0 refills | Status: DC | PRN

## 2017-07-28 NOTE — Progress Notes (Signed)
Office Visit Note   Patient: Julia West           Date of Birth: [birth year]           MRN: [id number] Visit Date: 07/28/2017              Requested by: [name], MD [place], [place] [address] PCP: [name], MD  Chief Complaint  Patient presents with  . Left Leg - Follow-up, Routine Post Op      HPI: Patient is a 62 year old woman who was status post transtibial amputation on the left with wound dehiscence.  Patient is currently undergoing a wound VAC therapy with home health nursing 3 times a week.  Patient does have dermatitis from some drainage.  Assessment & Plan: Visit Diagnoses:  1. Acquired absence of left leg below knee (HCC)   2. Dehiscence of operative wound, sequela     Plan: Recommend continue with the wound VAC 3 times a week continue with protein supplements recommended probiotics.  Will reevaluate in 4 weeks.  Patient's wound is showing good slow steady improvement.  Follow-Up Instructions: Return in about 1 month (around 08/25/2017).   Ortho Exam  Patient is alert, oriented, no adenopathy, well-dressed, normal affect, normal respiratory effort. Examination patient's wound has excellent granulation tissue it measures 4 x 5 cm and is 2 cm deep.  There is 100% beefy granulation tissue there is some mild dermatitis around the wound edges but there is no cellulitis no purulence no odor no signs of infection.  Imaging: No results found. No images are attached to the encounter.  Labs: Lab Results  Component Value Date   HGBA1C 5.4 05/09/2017   HGBA1C 5.6 02/07/2017   REPTSTATUS 06/25/2017 FINAL 06/19/2017   GRAMSTAIN  04/24/2012    NO WBC SEEN NO SQUAMOUS EPITHELIAL CELLS SEEN MODERATE GRAM POSITIVE COCCI IN PAIRS RARE GRAM NEGATIVE RODS   GRAMSTAIN  04/24/2012    NO WBC SEEN NO SQUAMOUS EPITHELIAL CELLS SEEN MODERATE GRAM POSITIVE COCCI IN PAIRS RARE GRAM NEGATIVE RODS   CULT  06/19/2017    NO GROWTH 5 DAYS Performed at  [facility], [place] [address] [place], [place] [address]    [place] 04/24/2012     Lab Results  Component Value Date   ALBUMIN 4.6 12/10/2016   ALBUMIN 4.4 08/20/2015   ALBUMIN 4.1 03/11/2015    There is no height or weight on file to calculate BMI.  Orders:  No orders of the defined types were placed in this encounter.  Meds ordered this encounter  Medications  . HYDROcodone-acetaminophen (NORCO/VICODIN) 5-325 MG tablet    Sig: Take 1-2 tablets by mouth every 6 (six) hours as needed for moderate pain or severe pain ((score 4 to 6)).    Dispense:  40 tablet    Refill:  0     Procedures: No procedures performed  Clinical Data: No additional findings.  ROS:  All other systems negative, except as noted in the HPI. Review of Systems  Objective: Vital Signs: There were no vitals taken for this visit.  Specialty Comments:  No specialty comments available.  PMFS History: Patient Active Problem List   Diagnosis Date Noted  . Infection of amputation stump of left lower extremity ([place]) 06/19/2017  . Acquired absence of left leg below knee ([place]) 05/19/2017  . Bradycardia 05/07/2017  . Depression 05/07/2017  . Lymphedema 05/07/2017  . S/P BKA (below knee amputation) unilateral, left ([place]) 05/06/2017  .  Gangrene of left foot ([place])   . Wound dehiscence, surgical, sequela   . Charcot foot due to diabetes mellitus ([place]) 02/09/2017  . 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 Post-menopausal bleeding 04/15/2014  . Endometrial polyp 04/09/2014  . Ventral hernia 03/06/2012  . Open abdominal wall wound 03/06/2012  . Hyperlipemia 11/12/2008  . OBESITY, MORBID 11/12/2008  . Essential hypertension 11/12/2008  . CAD (coronary artery disease) 11/12/2008   Past Medical History:  Diagnosis Date  . Acute myocardial infarction    with a ruptured plaque in the circumflex in 2003  . Anxiety   . Cancer ([place])    basal cell face    . Complication of anesthesia    states low O2 sats post-op 11/13, always slow to awaken  . Coronary artery disease   . Degenerative joint disease   . Dyspnea    with activity- car to house - steps  . Heart murmur    "not to be concerned"  . HTN (hypertension)   . Hyperlipidemia LDL goal <70   . Internal and external hemorrhoids without complication   . Lymphedema    right side of body  . Lymphedema of arm    right  . Morbid obesity ([place])   . Surgical wound, non healing ABDOMINAL    has wound vac @ 125 mm Hg    Family History  Problem Relation Age of Onset  . Heart attack Mother   . Hypertension Sister   . Diabetes Brother   . Birth defects Son        heart defect    Past Surgical History:  Procedure Laterality Date  . AMPUTATION Left 05/06/2017   Procedure: BELOW KNEE AMPUTATION;  Surgeon: Newt Minion, MD;  Location: [place];  Service: Orthopedics;  Laterality: Left;  . APPLICATION OF WOUND VAC    . BOWEL RESECTION  02/07/2012   Procedure: SMALL BOWEL RESECTION;  Surgeon: Gayland Curry, MD,FACS;  Location: [place];  Service: General;;  . CARDIOVASCULAR STRESS TEST  01-17-2012  DR HOCHREIN   LOW RISK NUCLEAR TEST  . CESAREAN SECTION     x 4 in remote past  . CHOLECYSTECTOMY    . CORONARY ARTERY BYPASS GRAFT  2003   by Dr. Servando Snare. LIMA to the LAD, free RIMA to the circumflex. Stress perfusion study December 2009 with no high-risk areas of ischemia. She has a well-preserved ejection fraction  . HYSTEROSCOPY W/D&C N/A 05/14/2014   Procedure: DILATATION AND CURETTAGE (no specimen); HYSTEROSCOPY;  Surgeon: Jonnie Kind, MD;  Location: [facility];  Service: Gynecology;  Laterality: N/A;  . I&D EXTREMITY Left 06/21/2017   Procedure: IRRIGATION AND DEBRIDEMENT OF LEFT LEG AMPUTATION SITE;  Surgeon: Wallace Going, DO;  [facility]: [facility];  Service: Plastics;  Laterality: Left;  . INCISION AND DRAINAGE OF WOUND  04/17/2012   Procedure: IRRIGATION AND DEBRIDEMENT WOUND;  Surgeon:  Theodoro Kos, DO;  Location: [facility];  Service: Plastics;  Laterality: N/A;  OF ABDOMINAL WOUND, SURGICAL PREP AND PLACEMENT OF VAC, REMOVAL FOREHEAD SKIN LESION  . INCISION AND DRAINAGE OF WOUND N/A 04/24/2012   Procedure: IRRIGATION AND DEBRIDEMENT WOUND;  Surgeon: Theodoro Kos, DO;  Location: [facility];  Service: Plastics;  Laterality: N/A;  I & D ABDOMINAL WOUND WITH VAC AND ACELL  . INCISION AND DRAINAGE OF WOUND N/A 05/01/2012   Procedure: IRRIGATION AND DEBRIDEMENT WOUND;  Surgeon: Theodoro Kos, DO;  Location:  [place];  Service: Plastics;  Laterality: N/A;  WITH SURGICAL PREP AND PLACEMENT OF VAC  . INCISION AND DRAINAGE OF WOUND N/A 05/08/2012   Procedure: IRRIGATION AND DEBRIDEMENT OF ABD WOUND SURGICAL PREP AND PLACEMENT OF VAC ;  Surgeon: Theodoro Kos, DO;  Location: [facility];  Service: Plastics;  Laterality: N/A;  IRRIGATION AND DEBRIDEMENT OF ABD WOUND SURGICAL PREP AND PLACEMENT OF VAC   . INCISION AND DRAINAGE OF WOUND N/A 05/15/2012   Procedure: IRRIGATION AND DEBRIDEMENT OF ABDOMINAL WOUND WITH POSSIBLE SURGICAL PREP AND PLACEMENT OF VAC;  Surgeon: Theodoro Kos, DO;  Location: [facility];  Service: Plastics;  Laterality: N/A;  . INCISION AND DRAINAGE OF WOUND N/A 05/22/2012   Procedure: IRRIGATION AND DEBRIDEMENT OF ABODOMINAL WOUND WITH  SURGICAL PREP AND VAC PLACEMENT;  Surgeon: Theodoro Kos, DO;  Location: [facility];  Service: Plastics;  Laterality: N/A;  . INCISION AND DRAINAGE OF WOUND N/A 06/28/2012   Procedure: IRRIGATION AND DEBRIDEMENT OF ABDOMINAL ULCER SURGICAL PREP AND PLACEMENT OF ACELL AND VAC;  Surgeon: Theodoro Kos, DO;  Location: [facility];  Service: Plastics;  Laterality: N/A;  . INCISIONAL HERNIA REPAIR  02/07/2012   Procedure: HERNIA REPAIR INCISIONAL;  Surgeon: Gayland Curry, MD,FACS;  Location: [facility];  Service: General;;  Open, Primary repair,  strangulated Incisional hernia.  Marland Kitchen LESION REMOVAL  04/17/2012   Procedure: LESION REMOVAL;  Surgeon: Theodoro Kos, DO;  Location: [facility];  Service: Plastics;  Laterality: N/A;   CENTER OF FOREHEAD, REMOVAL FORHEAD SKIN LESION  . ORIF TOE FRACTURE Left 02/09/2017   Procedure: EXCISION TALAR HEAD, INTERNAL FIXATION MEDIAL COLUMN LEFT FOOT;  Surgeon: Newt Minion, MD;  Location: [place];  Service: Orthopedics;  Laterality: Left;  . POLYPECTOMY N/A 05/14/2014   Procedure: ENDOMETRIAL POLYPECTOMY;  Surgeon: Jonnie Kind, MD;  Location[facility];  Service: Gynecology;  Laterality: N/A;   Social History   Occupational History  . Occupation: Estate manager/land agent    Comment: In the [employer]  Tobacco Use  . Smoking status: Former Smoker    Packs/day: 1.00    Years: 25.00    Pack years: 25.00    Types: Cigarettes    Last attempt to quit: 11/17/2011    Years since quitting: 5.6  . Smokeless tobacco: Never Used  Substance and Sexual Activity  . Alcohol use: No  . Drug use: No  . Sexual activity: Yes    Birth control/protection: Post-menopausal

## 2017-07-29 DIAGNOSIS — Z79891 Long term (current) use of opiate analgesic: Secondary | ICD-10-CM | POA: Diagnosis not present

## 2017-07-29 DIAGNOSIS — E785 Hyperlipidemia, unspecified: Secondary | ICD-10-CM | POA: Diagnosis not present

## 2017-07-29 DIAGNOSIS — Z791 Long term (current) use of non-steroidal anti-inflammatories (NSAID): Secondary | ICD-10-CM | POA: Diagnosis not present

## 2017-07-29 DIAGNOSIS — I2510 Atherosclerotic heart disease of native coronary artery without angina pectoris: Secondary | ICD-10-CM | POA: Diagnosis not present

## 2017-07-29 DIAGNOSIS — I10 Essential (primary) hypertension: Secondary | ICD-10-CM | POA: Diagnosis not present

## 2017-07-29 DIAGNOSIS — Z7982 Long term (current) use of aspirin: Secondary | ICD-10-CM | POA: Diagnosis not present

## 2017-07-29 DIAGNOSIS — T8781 Dehiscence of amputation stump: Secondary | ICD-10-CM | POA: Diagnosis not present

## 2017-07-29 DIAGNOSIS — Z89512 Acquired absence of left leg below knee: Secondary | ICD-10-CM | POA: Diagnosis not present

## 2017-07-29 DIAGNOSIS — Z87891 Personal history of nicotine dependence: Secondary | ICD-10-CM | POA: Diagnosis not present

## 2017-07-31 DIAGNOSIS — Z87891 Personal history of nicotine dependence: Secondary | ICD-10-CM | POA: Diagnosis not present

## 2017-07-31 DIAGNOSIS — Z7982 Long term (current) use of aspirin: Secondary | ICD-10-CM | POA: Diagnosis not present

## 2017-07-31 DIAGNOSIS — Z79891 Long term (current) use of opiate analgesic: Secondary | ICD-10-CM | POA: Diagnosis not present

## 2017-07-31 DIAGNOSIS — Z89512 Acquired absence of left leg below knee: Secondary | ICD-10-CM | POA: Diagnosis not present

## 2017-07-31 DIAGNOSIS — T8781 Dehiscence of amputation stump: Secondary | ICD-10-CM | POA: Diagnosis not present

## 2017-07-31 DIAGNOSIS — I2510 Atherosclerotic heart disease of native coronary artery without angina pectoris: Secondary | ICD-10-CM | POA: Diagnosis not present

## 2017-07-31 DIAGNOSIS — I10 Essential (primary) hypertension: Secondary | ICD-10-CM | POA: Diagnosis not present

## 2017-07-31 DIAGNOSIS — Z791 Long term (current) use of non-steroidal anti-inflammatories (NSAID): Secondary | ICD-10-CM | POA: Diagnosis not present

## 2017-07-31 DIAGNOSIS — E785 Hyperlipidemia, unspecified: Secondary | ICD-10-CM | POA: Diagnosis not present

## 2017-08-01 DIAGNOSIS — E785 Hyperlipidemia, unspecified: Secondary | ICD-10-CM | POA: Diagnosis not present

## 2017-08-01 DIAGNOSIS — Z89512 Acquired absence of left leg below knee: Secondary | ICD-10-CM | POA: Diagnosis not present

## 2017-08-01 DIAGNOSIS — Z79891 Long term (current) use of opiate analgesic: Secondary | ICD-10-CM | POA: Diagnosis not present

## 2017-08-01 DIAGNOSIS — Z791 Long term (current) use of non-steroidal anti-inflammatories (NSAID): Secondary | ICD-10-CM | POA: Diagnosis not present

## 2017-08-01 DIAGNOSIS — Z7982 Long term (current) use of aspirin: Secondary | ICD-10-CM | POA: Diagnosis not present

## 2017-08-01 DIAGNOSIS — I2510 Atherosclerotic heart disease of native coronary artery without angina pectoris: Secondary | ICD-10-CM | POA: Diagnosis not present

## 2017-08-01 DIAGNOSIS — T8781 Dehiscence of amputation stump: Secondary | ICD-10-CM | POA: Diagnosis not present

## 2017-08-01 DIAGNOSIS — I10 Essential (primary) hypertension: Secondary | ICD-10-CM | POA: Diagnosis not present

## 2017-08-01 DIAGNOSIS — Z87891 Personal history of nicotine dependence: Secondary | ICD-10-CM | POA: Diagnosis not present

## 2017-08-02 DIAGNOSIS — Z79891 Long term (current) use of opiate analgesic: Secondary | ICD-10-CM | POA: Diagnosis not present

## 2017-08-02 DIAGNOSIS — I2510 Atherosclerotic heart disease of native coronary artery without angina pectoris: Secondary | ICD-10-CM | POA: Diagnosis not present

## 2017-08-02 DIAGNOSIS — Z87891 Personal history of nicotine dependence: Secondary | ICD-10-CM | POA: Diagnosis not present

## 2017-08-02 DIAGNOSIS — I10 Essential (primary) hypertension: Secondary | ICD-10-CM | POA: Diagnosis not present

## 2017-08-02 DIAGNOSIS — Z89512 Acquired absence of left leg below knee: Secondary | ICD-10-CM | POA: Diagnosis not present

## 2017-08-02 DIAGNOSIS — Z7982 Long term (current) use of aspirin: Secondary | ICD-10-CM | POA: Diagnosis not present

## 2017-08-02 DIAGNOSIS — Z791 Long term (current) use of non-steroidal anti-inflammatories (NSAID): Secondary | ICD-10-CM | POA: Diagnosis not present

## 2017-08-02 DIAGNOSIS — E785 Hyperlipidemia, unspecified: Secondary | ICD-10-CM | POA: Diagnosis not present

## 2017-08-02 DIAGNOSIS — T8781 Dehiscence of amputation stump: Secondary | ICD-10-CM | POA: Diagnosis not present

## 2017-08-03 DIAGNOSIS — Z7982 Long term (current) use of aspirin: Secondary | ICD-10-CM | POA: Diagnosis not present

## 2017-08-03 DIAGNOSIS — Z79891 Long term (current) use of opiate analgesic: Secondary | ICD-10-CM | POA: Diagnosis not present

## 2017-08-03 DIAGNOSIS — I10 Essential (primary) hypertension: Secondary | ICD-10-CM | POA: Diagnosis not present

## 2017-08-03 DIAGNOSIS — Z89512 Acquired absence of left leg below knee: Secondary | ICD-10-CM | POA: Diagnosis not present

## 2017-08-03 DIAGNOSIS — T8781 Dehiscence of amputation stump: Secondary | ICD-10-CM | POA: Diagnosis not present

## 2017-08-03 DIAGNOSIS — Z791 Long term (current) use of non-steroidal anti-inflammatories (NSAID): Secondary | ICD-10-CM | POA: Diagnosis not present

## 2017-08-03 DIAGNOSIS — T8189XA Other complications of procedures, not elsewhere classified, initial encounter: Secondary | ICD-10-CM | POA: Diagnosis not present

## 2017-08-03 DIAGNOSIS — R2681 Unsteadiness on feet: Secondary | ICD-10-CM | POA: Diagnosis not present

## 2017-08-03 DIAGNOSIS — I2510 Atherosclerotic heart disease of native coronary artery without angina pectoris: Secondary | ICD-10-CM | POA: Diagnosis not present

## 2017-08-03 DIAGNOSIS — Z87891 Personal history of nicotine dependence: Secondary | ICD-10-CM | POA: Diagnosis not present

## 2017-08-03 DIAGNOSIS — E785 Hyperlipidemia, unspecified: Secondary | ICD-10-CM | POA: Diagnosis not present

## 2017-08-03 DIAGNOSIS — M869 Osteomyelitis, unspecified: Secondary | ICD-10-CM | POA: Diagnosis not present

## 2017-08-03 DIAGNOSIS — Z85828 Personal history of other malignant neoplasm of skin: Secondary | ICD-10-CM | POA: Diagnosis not present

## 2017-08-04 ENCOUNTER — Other Ambulatory Visit: Payer: Self-pay | Admitting: Family Medicine

## 2017-08-04 MED ORDER — SERTRALINE HCL 50 MG PO TABS: 50.0000 mg | ORAL_TABLET | Freq: Every day | ORAL | 0 refills | Status: DC

## 2017-08-04 MED ORDER — HYDROCHLOROTHIAZIDE 12.5 MG PO CAPS: 12.5000 mg | ORAL_CAPSULE | Freq: Every day | ORAL | 0 refills | Status: DC

## 2017-08-04 MED ORDER — LOSARTAN POTASSIUM 100 MG PO TABS: 100.0000 mg | ORAL_TABLET | Freq: Every day | ORAL | 0 refills | Status: DC

## 2017-08-04 NOTE — Telephone Encounter (Signed)
Pt is needing refills on has apt on 6/12 with Dr Livia Snellen  sertraline (ZOLOFT) 50 MG tablet Losartan 100 mg Hydrochlorothiazide 12.5 mg   Uses [facility]

## 2017-08-05 DIAGNOSIS — T8189XA Other complications of procedures, not elsewhere classified, initial encounter: Secondary | ICD-10-CM | POA: Diagnosis not present

## 2017-08-05 DIAGNOSIS — Z87891 Personal history of nicotine dependence: Secondary | ICD-10-CM | POA: Diagnosis not present

## 2017-08-05 DIAGNOSIS — S81009A Unspecified open wound, unspecified knee, initial encounter: Secondary | ICD-10-CM | POA: Diagnosis not present

## 2017-08-05 DIAGNOSIS — Z7982 Long term (current) use of aspirin: Secondary | ICD-10-CM | POA: Diagnosis not present

## 2017-08-05 DIAGNOSIS — Z791 Long term (current) use of non-steroidal anti-inflammatories (NSAID): Secondary | ICD-10-CM | POA: Diagnosis not present

## 2017-08-05 DIAGNOSIS — I2510 Atherosclerotic heart disease of native coronary artery without angina pectoris: Secondary | ICD-10-CM | POA: Diagnosis not present

## 2017-08-05 DIAGNOSIS — I10 Essential (primary) hypertension: Secondary | ICD-10-CM | POA: Diagnosis not present

## 2017-08-05 DIAGNOSIS — E785 Hyperlipidemia, unspecified: Secondary | ICD-10-CM | POA: Diagnosis not present

## 2017-08-05 DIAGNOSIS — T8781 Dehiscence of amputation stump: Secondary | ICD-10-CM | POA: Diagnosis not present

## 2017-08-05 DIAGNOSIS — Z89512 Acquired absence of left leg below knee: Secondary | ICD-10-CM | POA: Diagnosis not present

## 2017-08-05 DIAGNOSIS — Z79891 Long term (current) use of opiate analgesic: Secondary | ICD-10-CM | POA: Diagnosis not present

## 2017-08-06 DIAGNOSIS — S81009A Unspecified open wound, unspecified knee, initial encounter: Secondary | ICD-10-CM | POA: Diagnosis not present

## 2017-08-06 DIAGNOSIS — R2681 Unsteadiness on feet: Secondary | ICD-10-CM | POA: Diagnosis not present

## 2017-08-06 DIAGNOSIS — T8189XA Other complications of procedures, not elsewhere classified, initial encounter: Secondary | ICD-10-CM | POA: Diagnosis not present

## 2017-08-07 DIAGNOSIS — E11610 Type 2 diabetes mellitus with diabetic neuropathic arthropathy: Secondary | ICD-10-CM | POA: Diagnosis not present

## 2017-08-07 DIAGNOSIS — Z4789 Encounter for other orthopedic aftercare: Secondary | ICD-10-CM | POA: Diagnosis not present

## 2017-08-07 DIAGNOSIS — R2681 Unsteadiness on feet: Secondary | ICD-10-CM | POA: Diagnosis not present

## 2017-08-08 DIAGNOSIS — Z87891 Personal history of nicotine dependence: Secondary | ICD-10-CM | POA: Diagnosis not present

## 2017-08-08 DIAGNOSIS — Z791 Long term (current) use of non-steroidal anti-inflammatories (NSAID): Secondary | ICD-10-CM | POA: Diagnosis not present

## 2017-08-08 DIAGNOSIS — Z7982 Long term (current) use of aspirin: Secondary | ICD-10-CM | POA: Diagnosis not present

## 2017-08-08 DIAGNOSIS — Z89512 Acquired absence of left leg below knee: Secondary | ICD-10-CM | POA: Diagnosis not present

## 2017-08-08 DIAGNOSIS — E785 Hyperlipidemia, unspecified: Secondary | ICD-10-CM | POA: Diagnosis not present

## 2017-08-08 DIAGNOSIS — Z79891 Long term (current) use of opiate analgesic: Secondary | ICD-10-CM | POA: Diagnosis not present

## 2017-08-08 DIAGNOSIS — I10 Essential (primary) hypertension: Secondary | ICD-10-CM | POA: Diagnosis not present

## 2017-08-08 DIAGNOSIS — I2510 Atherosclerotic heart disease of native coronary artery without angina pectoris: Secondary | ICD-10-CM | POA: Diagnosis not present

## 2017-08-08 DIAGNOSIS — T8781 Dehiscence of amputation stump: Secondary | ICD-10-CM | POA: Diagnosis not present

## 2017-08-10 DIAGNOSIS — Z87891 Personal history of nicotine dependence: Secondary | ICD-10-CM | POA: Diagnosis not present

## 2017-08-10 DIAGNOSIS — Z79891 Long term (current) use of opiate analgesic: Secondary | ICD-10-CM | POA: Diagnosis not present

## 2017-08-10 DIAGNOSIS — I10 Essential (primary) hypertension: Secondary | ICD-10-CM | POA: Diagnosis not present

## 2017-08-10 DIAGNOSIS — Z89512 Acquired absence of left leg below knee: Secondary | ICD-10-CM | POA: Diagnosis not present

## 2017-08-10 DIAGNOSIS — I2510 Atherosclerotic heart disease of native coronary artery without angina pectoris: Secondary | ICD-10-CM | POA: Diagnosis not present

## 2017-08-10 DIAGNOSIS — E785 Hyperlipidemia, unspecified: Secondary | ICD-10-CM | POA: Diagnosis not present

## 2017-08-10 DIAGNOSIS — Z7982 Long term (current) use of aspirin: Secondary | ICD-10-CM | POA: Diagnosis not present

## 2017-08-10 DIAGNOSIS — Z791 Long term (current) use of non-steroidal anti-inflammatories (NSAID): Secondary | ICD-10-CM | POA: Diagnosis not present

## 2017-08-10 DIAGNOSIS — T8781 Dehiscence of amputation stump: Secondary | ICD-10-CM | POA: Diagnosis not present

## 2017-08-11 DIAGNOSIS — T8781 Dehiscence of amputation stump: Secondary | ICD-10-CM | POA: Diagnosis not present

## 2017-08-11 DIAGNOSIS — I2510 Atherosclerotic heart disease of native coronary artery without angina pectoris: Secondary | ICD-10-CM | POA: Diagnosis not present

## 2017-08-11 DIAGNOSIS — Z791 Long term (current) use of non-steroidal anti-inflammatories (NSAID): Secondary | ICD-10-CM | POA: Diagnosis not present

## 2017-08-11 DIAGNOSIS — Z7982 Long term (current) use of aspirin: Secondary | ICD-10-CM | POA: Diagnosis not present

## 2017-08-11 DIAGNOSIS — I10 Essential (primary) hypertension: Secondary | ICD-10-CM | POA: Diagnosis not present

## 2017-08-11 DIAGNOSIS — Z87891 Personal history of nicotine dependence: Secondary | ICD-10-CM | POA: Diagnosis not present

## 2017-08-11 DIAGNOSIS — Z89512 Acquired absence of left leg below knee: Secondary | ICD-10-CM | POA: Diagnosis not present

## 2017-08-11 DIAGNOSIS — Z79891 Long term (current) use of opiate analgesic: Secondary | ICD-10-CM | POA: Diagnosis not present

## 2017-08-11 DIAGNOSIS — E785 Hyperlipidemia, unspecified: Secondary | ICD-10-CM | POA: Diagnosis not present

## 2017-08-12 ENCOUNTER — Telehealth (INDEPENDENT_AMBULATORY_CARE_PROVIDER_SITE_OTHER): Payer: Self-pay | Admitting: Orthopedic Surgery

## 2017-08-12 DIAGNOSIS — T8789 Other complications of amputation stump: Secondary | ICD-10-CM | POA: Diagnosis not present

## 2017-08-12 NOTE — Telephone Encounter (Signed)
Patient would like a refill on her hydrocodone, she will be in the area around 11 and would like a call if possible before then. CB # [phone]

## 2017-08-12 NOTE — Telephone Encounter (Signed)
IC patient and advised it will be Monday before this could be addressed, she says she will see if her wound doctor will refill it for her, and then she will have the PCP take over refills.  She will call if she needs anything further.

## 2017-08-15 ENCOUNTER — Telehealth: Payer: Self-pay | Admitting: Family Medicine

## 2017-08-15 ENCOUNTER — Other Ambulatory Visit (INDEPENDENT_AMBULATORY_CARE_PROVIDER_SITE_OTHER): Payer: Self-pay | Admitting: Orthopedic Surgery

## 2017-08-15 ENCOUNTER — Telehealth (INDEPENDENT_AMBULATORY_CARE_PROVIDER_SITE_OTHER): Payer: Self-pay | Admitting: Orthopedic Surgery

## 2017-08-15 DIAGNOSIS — Z87891 Personal history of nicotine dependence: Secondary | ICD-10-CM | POA: Diagnosis not present

## 2017-08-15 DIAGNOSIS — Z791 Long term (current) use of non-steroidal anti-inflammatories (NSAID): Secondary | ICD-10-CM | POA: Diagnosis not present

## 2017-08-15 DIAGNOSIS — I10 Essential (primary) hypertension: Secondary | ICD-10-CM | POA: Diagnosis not present

## 2017-08-15 DIAGNOSIS — E785 Hyperlipidemia, unspecified: Secondary | ICD-10-CM | POA: Diagnosis not present

## 2017-08-15 DIAGNOSIS — Z89512 Acquired absence of left leg below knee: Secondary | ICD-10-CM | POA: Diagnosis not present

## 2017-08-15 DIAGNOSIS — T8781 Dehiscence of amputation stump: Secondary | ICD-10-CM | POA: Diagnosis not present

## 2017-08-15 DIAGNOSIS — Z79891 Long term (current) use of opiate analgesic: Secondary | ICD-10-CM | POA: Diagnosis not present

## 2017-08-15 DIAGNOSIS — Z7982 Long term (current) use of aspirin: Secondary | ICD-10-CM | POA: Diagnosis not present

## 2017-08-15 DIAGNOSIS — I2510 Atherosclerotic heart disease of native coronary artery without angina pectoris: Secondary | ICD-10-CM | POA: Diagnosis not present

## 2017-08-15 MED ORDER — HYDROCODONE-ACETAMINOPHEN 5-325 MG PO TABS: 1.0000 | ORAL_TABLET | Freq: Four times a day (QID) | ORAL | 0 refills | Status: DC | PRN

## 2017-08-15 NOTE — Telephone Encounter (Signed)
Aware.  Must get refill from original prescriber.

## 2017-08-15 NOTE — Telephone Encounter (Signed)
Pt is a BKA and is asking for a refill of her Vicodin.

## 2017-08-15 NOTE — Telephone Encounter (Signed)
Called and advised that rx is at the front desk for pick up.

## 2017-08-15 NOTE — Telephone Encounter (Signed)
Patient requesting rx refill on hydrocodone. Patients # [phone]

## 2017-08-15 NOTE — Telephone Encounter (Signed)
rx written

## 2017-08-17 DIAGNOSIS — T8781 Dehiscence of amputation stump: Secondary | ICD-10-CM | POA: Diagnosis not present

## 2017-08-17 DIAGNOSIS — Z7982 Long term (current) use of aspirin: Secondary | ICD-10-CM | POA: Diagnosis not present

## 2017-08-17 DIAGNOSIS — E785 Hyperlipidemia, unspecified: Secondary | ICD-10-CM | POA: Diagnosis not present

## 2017-08-17 DIAGNOSIS — Z89512 Acquired absence of left leg below knee: Secondary | ICD-10-CM | POA: Diagnosis not present

## 2017-08-17 DIAGNOSIS — I10 Essential (primary) hypertension: Secondary | ICD-10-CM | POA: Diagnosis not present

## 2017-08-17 DIAGNOSIS — I2510 Atherosclerotic heart disease of native coronary artery without angina pectoris: Secondary | ICD-10-CM | POA: Diagnosis not present

## 2017-08-17 DIAGNOSIS — Z791 Long term (current) use of non-steroidal anti-inflammatories (NSAID): Secondary | ICD-10-CM | POA: Diagnosis not present

## 2017-08-17 DIAGNOSIS — Z87891 Personal history of nicotine dependence: Secondary | ICD-10-CM | POA: Diagnosis not present

## 2017-08-17 DIAGNOSIS — Z79891 Long term (current) use of opiate analgesic: Secondary | ICD-10-CM | POA: Diagnosis not present

## 2017-08-19 DIAGNOSIS — Z89512 Acquired absence of left leg below knee: Secondary | ICD-10-CM | POA: Diagnosis not present

## 2017-08-19 DIAGNOSIS — Z85828 Personal history of other malignant neoplasm of skin: Secondary | ICD-10-CM | POA: Diagnosis not present

## 2017-08-19 DIAGNOSIS — I10 Essential (primary) hypertension: Secondary | ICD-10-CM | POA: Diagnosis not present

## 2017-08-19 DIAGNOSIS — R2681 Unsteadiness on feet: Secondary | ICD-10-CM | POA: Diagnosis not present

## 2017-08-19 DIAGNOSIS — T8781 Dehiscence of amputation stump: Secondary | ICD-10-CM | POA: Diagnosis not present

## 2017-08-19 DIAGNOSIS — E785 Hyperlipidemia, unspecified: Secondary | ICD-10-CM | POA: Diagnosis not present

## 2017-08-19 DIAGNOSIS — Z87891 Personal history of nicotine dependence: Secondary | ICD-10-CM | POA: Diagnosis not present

## 2017-08-19 DIAGNOSIS — M869 Osteomyelitis, unspecified: Secondary | ICD-10-CM | POA: Diagnosis not present

## 2017-08-19 DIAGNOSIS — Z791 Long term (current) use of non-steroidal anti-inflammatories (NSAID): Secondary | ICD-10-CM | POA: Diagnosis not present

## 2017-08-19 DIAGNOSIS — T8189XA Other complications of procedures, not elsewhere classified, initial encounter: Secondary | ICD-10-CM | POA: Diagnosis not present

## 2017-08-19 DIAGNOSIS — Z79891 Long term (current) use of opiate analgesic: Secondary | ICD-10-CM | POA: Diagnosis not present

## 2017-08-19 DIAGNOSIS — Z7982 Long term (current) use of aspirin: Secondary | ICD-10-CM | POA: Diagnosis not present

## 2017-08-19 DIAGNOSIS — I2510 Atherosclerotic heart disease of native coronary artery without angina pectoris: Secondary | ICD-10-CM | POA: Diagnosis not present

## 2017-08-22 ENCOUNTER — Ambulatory Visit (INDEPENDENT_AMBULATORY_CARE_PROVIDER_SITE_OTHER): Payer: Medicare Other | Admitting: Orthopedic Surgery

## 2017-08-22 ENCOUNTER — Encounter (INDEPENDENT_AMBULATORY_CARE_PROVIDER_SITE_OTHER): Payer: Self-pay | Admitting: Orthopedic Surgery

## 2017-08-22 DIAGNOSIS — T8189XA Other complications of procedures, not elsewhere classified, initial encounter: Secondary | ICD-10-CM | POA: Diagnosis not present

## 2017-08-22 DIAGNOSIS — T8131XS Disruption of external operation (surgical) wound, not elsewhere classified, sequela: Secondary | ICD-10-CM

## 2017-08-22 DIAGNOSIS — Z89512 Acquired absence of left leg below knee: Secondary | ICD-10-CM

## 2017-08-22 DIAGNOSIS — S31105A Unspecified open wound of abdominal wall, periumbilic region without penetration into peritoneal cavity, initial encounter: Secondary | ICD-10-CM | POA: Diagnosis not present

## 2017-08-22 DIAGNOSIS — T8781 Dehiscence of amputation stump: Secondary | ICD-10-CM | POA: Diagnosis not present

## 2017-08-22 MED ORDER — DOXYCYCLINE HYCLATE 100 MG PO TABS: 100.0000 mg | ORAL_TABLET | Freq: Two times a day (BID) | ORAL | 0 refills | Status: DC

## 2017-08-22 NOTE — Progress Notes (Signed)
Office Visit Note   Patient: Julia West           Date of Birth: Jun 01, 1955           MRN: [id number] Visit Date: 08/22/2017              Requested by: [name], MD [place], [place] [address] PCP: [name], MD  Chief Complaint  Patient presents with  . Left Leg - Follow-up    Irrigation and Debridement of Left Leg Amputation Site-left on 06/21/17      HPI: Patient is a 62 year old woman status post revision left transtibial amputation with a large wound she is currently using a wound VAC that is being changed 3 times a week at home.  Patient is completed a course of doxycycline and Bactrim DS.  Assessment & Plan: Visit Diagnoses:  1. Acquired absence of left leg below knee (HCC)   2. Dehiscence of operative wound, sequela     Plan: We will restart doxycycline continue with the wound VAC follow-up in 2 weeks.  Discussed that a follow-up were not showing any improvement we would need to consider revision surgery.  Follow-Up Instructions: Return in about 2 weeks (around 09/05/2017).   Ortho Exam  Patient is alert, oriented, no adenopathy, well-dressed, normal affect, normal respiratory effort. Examination patient does have some dermatitis around the wound.  There is healthy granulation tissue at the base of the wound there are 2 new tunneling ulcers laterally these do communicate with the main ulcer.  There is no exposed bone or tendon these do not probe to bone.  Imaging: No results found. No images are attached to the encounter.  Labs: Lab Results  Component Value Date   HGBA1C 5.4 05/09/2017   HGBA1C 5.6 02/07/2017   REPTSTATUS 06/25/2017 FINAL 06/19/2017   GRAMSTAIN  04/24/2012    NO WBC SEEN NO SQUAMOUS EPITHELIAL CELLS SEEN MODERATE GRAM POSITIVE COCCI IN PAIRS RARE GRAM NEGATIVE RODS   GRAMSTAIN  04/24/2012    NO WBC SEEN NO SQUAMOUS EPITHELIAL CELLS SEEN MODERATE GRAM POSITIVE COCCI IN PAIRS RARE GRAM NEGATIVE RODS   CULT   06/19/2017    NO GROWTH 5 DAYS Performed at [facility], [place] [address], [place], [place] [address]    [place] 04/24/2012     Lab Results  Component Value Date   ALBUMIN 4.6 12/10/2016   ALBUMIN 4.4 08/20/2015   ALBUMIN 4.1 03/11/2015    There is no height or weight on file to calculate BMI.  Orders:  No orders of the defined types were placed in this encounter.  Meds ordered this encounter  Medications  . doxycycline (VIBRA-TABS) 100 MG tablet    Sig: Take 1 tablet (100 mg total) by mouth 2 (two) times daily.    Dispense:  60 tablet    Refill:  0     Procedures: No procedures performed  Clinical Data: No additional findings.  ROS:  All other systems negative, except as noted in the HPI. Review of Systems  Objective: Vital Signs: There were no vitals taken for this visit.  Specialty Comments:  No specialty comments available.  PMFS History: Patient Active Problem List   Diagnosis Date Noted  . Infection of amputation stump of left lower extremity ([place]) 06/19/2017  . Acquired absence of left leg below knee ([place]) 05/19/2017  . Bradycardia 05/07/2017  . Depression 05/07/2017  . Lymphedema 05/07/2017  . S/P BKA (below knee amputation) unilateral,  left ([place]) 05/06/2017  . Gangrene of left foot ([place])   . Wound dehiscence, surgical, sequela   . Charcot foot due to diabetes mellitus ([place]) 02/09/2017  . 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 Post-menopausal bleeding 04/15/2014  . Endometrial polyp 04/09/2014  . Ventral hernia 03/06/2012  . Open abdominal wall wound 03/06/2012  . Hyperlipemia 11/12/2008  . OBESITY, MORBID 11/12/2008  . Essential hypertension 11/12/2008  . CAD (coronary artery disease) 11/12/2008   Past Medical History:  Diagnosis Date  . Acute myocardial infarction    with a ruptured plaque in the circumflex in 2003  . Anxiety   . Cancer ([place])    basal cell face  . Complication of  anesthesia    states low O2 sats post-op 11/13, always slow to awaken  . Coronary artery disease   . Degenerative joint disease   . Dyspnea    with activity- car to house - steps  . Heart murmur    "not to be concerned"  . HTN (hypertension)   . Hyperlipidemia LDL goal <70   . Internal and external hemorrhoids without complication   . Lymphedema    right side of body  . Lymphedema of arm    right  . Morbid obesity ([place])   . Surgical wound, non healing ABDOMINAL    has wound vac @ 125 mm Hg    Family History  Problem Relation Age of Onset  . Heart attack Mother   . Hypertension Sister   . Diabetes Brother   . Birth defects Son        heart defect    Past Surgical History:  Procedure Laterality Date  . AMPUTATION Left 05/06/2017   Procedure: BELOW KNEE AMPUTATION;  Surgeon: Newt Minion, MD;  Location: [facility];  Service: Orthopedics;  Laterality: Left;  . APPLICATION OF WOUND VAC    . BOWEL RESECTION  02/07/2012   Procedure: SMALL BOWEL RESECTION;  Surgeon: Gayland Curry, MD,FACS;  Location: [facility];  Service: General;;  . CARDIOVASCULAR STRESS TEST  01-17-2012  DR HOCHREIN   LOW RISK NUCLEAR TEST  . CESAREAN SECTION     x 4 in remote past  . CHOLECYSTECTOMY    . CORONARY ARTERY BYPASS GRAFT  2003   by Dr. Servando Snare. LIMA to the LAD, free RIMA to the circumflex. Stress perfusion study December 2009 with no high-risk areas of ischemia. She has a well-preserved ejection fraction  . HYSTEROSCOPY W/D&C N/A 05/14/2014   Procedure: DILATATION AND CURETTAGE (no specimen); HYSTEROSCOPY;  Surgeon: Jonnie Kind, MD;  [facility]: [facility];  Service: Gynecology;  Laterality: N/A;  . I&D EXTREMITY Left 06/21/2017   Procedure: IRRIGATION AND DEBRIDEMENT OF LEFT LEG AMPUTATION SITE;  Surgeon: Wallace Going, DO;  [facility];  Service: Plastics;  Laterality: Left;  . INCISION AND DRAINAGE OF WOUND  04/17/2012   Procedure: IRRIGATION AND DEBRIDEMENT WOUND;  Surgeon: Theodoro Kos, DO;   Location: [facility];  Service: Plastics;  Laterality: N/A;  OF ABDOMINAL WOUND, SURGICAL PREP AND PLACEMENT OF VAC, REMOVAL FOREHEAD SKIN LESION  . INCISION AND DRAINAGE OF WOUND N/A 04/24/2012   Procedure: IRRIGATION AND DEBRIDEMENT WOUND;  Surgeon: Theodoro Kos, DO;  Location: [facility];  Service: Plastics;  Laterality: N/A;  I & D ABDOMINAL WOUND WITH VAC AND ACELL  . INCISION AND DRAINAGE OF WOUND N/A 05/01/2012   Procedure: IRRIGATION AND DEBRIDEMENT WOUND;  Surgeon: Lyndee Leo  [place]anger, DO;  Location: [facility];  Service: Clinical cytogeneticist;  Laterality: N/A;  WITH SURGICAL PREP AND PLACEMENT OF VAC  . INCISION AND DRAINAGE OF WOUND N/A 05/08/2012   Procedure: IRRIGATION AND DEBRIDEMENT OF ABD WOUND SURGICAL PREP AND PLACEMENT OF VAC ;  Surgeon: Theodoro Kos, DO;  Location: [facility];  Service: Plastics;  Laterality: N/A;  IRRIGATION AND DEBRIDEMENT OF ABD WOUND SURGICAL PREP AND PLACEMENT OF VAC   . INCISION AND DRAINAGE OF WOUND N/A 05/15/2012   Procedure: IRRIGATION AND DEBRIDEMENT OF ABDOMINAL WOUND WITH POSSIBLE SURGICAL PREP AND PLACEMENT OF VAC;  Surgeon: Theodoro Kos, DO;  Location: [facility];  Service: Plastics;  Laterality: N/A;  . INCISION AND DRAINAGE OF WOUND N/A 05/22/2012   Procedure: IRRIGATION AND DEBRIDEMENT OF ABODOMINAL WOUND WITH  SURGICAL PREP AND VAC PLACEMENT;  Surgeon: Theodoro Kos, DO;  Location: [facility];  Service: Plastics;  Laterality: N/A;  . INCISION AND DRAINAGE OF WOUND N/A 06/28/2012   Procedure: IRRIGATION AND DEBRIDEMENT OF ABDOMINAL ULCER SURGICAL PREP AND PLACEMENT OF ACELL AND VAC;  Surgeon: Theodoro Kos, DO;  Location: [place];  Service: Plastics;  Laterality: N/A;  . INCISIONAL HERNIA REPAIR  02/07/2012   Procedure: HERNIA REPAIR INCISIONAL;  Surgeon: Gayland Curry, MD,FACS;  Location: [place];  Service: General;;  Open, Primary repair, strangulated  Incisional hernia.  Marland Kitchen LESION REMOVAL  04/17/2012   Procedure: LESION REMOVAL;  Surgeon: Theodoro Kos, DO;  Location: [facility];  Service: Plastics;  Laterality: N/A;   CENTER OF FOREHEAD, REMOVAL FORHEAD SKIN LESION  . ORIF TOE FRACTURE Left 02/09/2017   Procedure: EXCISION TALAR HEAD, INTERNAL FIXATION MEDIAL COLUMN LEFT FOOT;  Surgeon: Newt Minion, MD;  Location: [facility];  Service: Orthopedics;  Laterality: Left;  . POLYPECTOMY N/A 05/14/2014   Procedure: ENDOMETRIAL POLYPECTOMY;  Surgeon: Jonnie Kind, MD;  Location: [facility];  Service: Gynecology;  Laterality: N/A;   Social History   Occupational History  . Occupation: Estate manager/land agent    Comment: In the [employer]  Tobacco Use  . Smoking status: Former Smoker    Packs/day: 1.00    Years: 25.00    Pack years: 25.00    Types: Cigarettes    Last attempt to quit: 11/17/2011    Years since quitting: 5.7  . Smokeless tobacco: Never Used  Substance and Sexual Activity  . Alcohol use: No  . Drug use: No  . Sexual activity: Yes    Birth control/protection: Post-menopausal

## 2017-08-24 ENCOUNTER — Ambulatory Visit (INDEPENDENT_AMBULATORY_CARE_PROVIDER_SITE_OTHER): Payer: Medicare Other | Admitting: Family Medicine

## 2017-08-24 ENCOUNTER — Encounter: Payer: Self-pay | Admitting: Family Medicine

## 2017-08-24 VITALS — BP 99/61 | HR 58 | Temp 96.8°F | Ht 66.0 in

## 2017-08-24 DIAGNOSIS — Z89512 Acquired absence of left leg below knee: Secondary | ICD-10-CM | POA: Diagnosis not present

## 2017-08-24 DIAGNOSIS — E11610 Type 2 diabetes mellitus with diabetic neuropathic arthropathy: Secondary | ICD-10-CM

## 2017-08-24 DIAGNOSIS — Z87891 Personal history of nicotine dependence: Secondary | ICD-10-CM | POA: Diagnosis not present

## 2017-08-24 DIAGNOSIS — I10 Essential (primary) hypertension: Secondary | ICD-10-CM

## 2017-08-24 DIAGNOSIS — E782 Mixed hyperlipidemia: Secondary | ICD-10-CM

## 2017-08-24 DIAGNOSIS — E785 Hyperlipidemia, unspecified: Secondary | ICD-10-CM | POA: Diagnosis not present

## 2017-08-24 DIAGNOSIS — T8781 Dehiscence of amputation stump: Secondary | ICD-10-CM | POA: Diagnosis not present

## 2017-08-24 DIAGNOSIS — Z79891 Long term (current) use of opiate analgesic: Secondary | ICD-10-CM | POA: Diagnosis not present

## 2017-08-24 DIAGNOSIS — Z7982 Long term (current) use of aspirin: Secondary | ICD-10-CM | POA: Diagnosis not present

## 2017-08-24 DIAGNOSIS — I2510 Atherosclerotic heart disease of native coronary artery without angina pectoris: Secondary | ICD-10-CM | POA: Diagnosis not present

## 2017-08-24 DIAGNOSIS — Z791 Long term (current) use of non-steroidal anti-inflammatories (NSAID): Secondary | ICD-10-CM | POA: Diagnosis not present

## 2017-08-24 LAB — BAYER DCA HB A1C WAIVED: HB A1C: 5.3 % (ref <7.0)

## 2017-08-24 MED ORDER — FENOFIBRATE 160 MG PO TABS: ORAL_TABLET | ORAL | 1 refills | Status: DC

## 2017-08-24 MED ORDER — SERTRALINE HCL 50 MG PO TABS: 50.0000 mg | ORAL_TABLET | Freq: Every day | ORAL | 1 refills | Status: DC

## 2017-08-24 MED ORDER — LOSARTAN POTASSIUM 100 MG PO TABS: 100.0000 mg | ORAL_TABLET | Freq: Every day | ORAL | 1 refills | Status: DC

## 2017-08-24 MED ORDER — HYDROCHLOROTHIAZIDE 12.5 MG PO CAPS: 12.5000 mg | ORAL_CAPSULE | Freq: Every day | ORAL | 1 refills | Status: DC

## 2017-08-24 MED ORDER — VALSARTAN-HYDROCHLOROTHIAZIDE 160-12.5 MG PO TABS: 1.0000 | ORAL_TABLET | Freq: Every day | ORAL | 1 refills | Status: DC

## 2017-08-24 NOTE — Progress Notes (Signed)
Subjective:  Patient ID: Julia West,  female    DOB: October 23, 1955  Age: 62 y.o.    CC: Medical Management of Chronic Issues   HPI [name] presents for  follow-up of hypertension. Patient has no history of headache chest pain or shortness of breath or recent cough. Patient also denies symptoms of TIA such as numbness weakness lateralizing. Patient denies side effects from medication. States taking it regularly.  Patient also  in for follow-up of elevated cholesterol. Doing well without complaints on current medication. Denies side effects  including myalgia and arthralgia and nausea. Also in today for liver function testing. Currently no chest pain, shortness of breath or other cardiovascular related symptoms noted.  Follow-up of diabetes. Patient does not check blood sugar at home. Patient denies symptoms such as excessive hunger or urinary frequency, excessive hunger, nausea No significant hypoglycemic spells noted. Medications reviewed. Pt reports taking them regularly. Pt. denies complication/adverse reaction today.   Still having wound vac tx. Dr. Sharol Given considering debridement if not improved in 2 weeks  History Julia West has a past medical history of Acute myocardial infarction, Anxiety, Cancer ([place]), Complication of anesthesia, Coronary artery disease, Degenerative joint disease, Dyspnea, Heart murmur, HTN (hypertension), Hyperlipidemia LDL goal <70, Internal and external hemorrhoids without complication, Lymphedema, Lymphedema of arm, Morbid obesity ([name]), and Surgical wound, non healing (ABDOMINAL ).   She has a past surgical history that includes Cesarean section; Cholecystectomy; Coronary artery bypass graft (2003); Incisional hernia repair (02/07/2012); Bowel resection (02/07/2012); Application if wound vac; Incision and drainage of wound (04/17/2012); Lesion removal (04/17/2012); Incision and drainage of wound (N/A, 04/24/2012); Incision and drainage of wound (N/A,  05/01/2012); Incision and drainage of wound (N/A, 05/08/2012); Cardiovascular stress test (01-17-2012  [facility]); Incision and drainage of wound (N/A, 05/15/2012); Incision and drainage of wound (N/A, 05/22/2012); Incision and drainage of wound (N/A, 06/28/2012); polypectomy (N/A, 05/14/2014); Hysteroscopy w/D&C (N/A, 05/14/2014); ORIF toe fracture (Left, 02/09/2017); Amputation (Left, 05/06/2017); and I&D extremity (Left, 06/21/2017).   Her family history includes Birth defects in her son; Diabetes in her brother; Heart attack in her mother; Hypertension in her sister.She reports that she quit smoking about 5 years ago. Her smoking use included cigarettes. She has a 25.00 pack-year smoking history. She has never used smokeless tobacco. She reports that she does not drink alcohol or use drugs.  Current Outpatient Medications on File Prior to Visit  Medication Sig Dispense Refill  . aspirin 81 MG tablet Take 81 mg by mouth daily.     Marland Kitchen doxycycline (VIBRA-TABS) 100 MG tablet Take 1 tablet (100 mg total) by mouth 2 (two) times daily. 60 tablet 0  . fexofenadine (ALLEGRA) 180 MG tablet Take 180 mg by mouth daily.    Marland Kitchen gabapentin (NEURONTIN) 100 MG capsule Take 100 mg by mouth at bedtime.    Marland Kitchen HYDROcodone-acetaminophen (NORCO) 7.5-325 MG tablet Take 1 tablet by mouth every 6 (six) hours as needed for moderate pain.    . Multiple Vitamins-Minerals (MULTIVITAMIN ADULT) TABS Take 1 tablet by mouth daily.    . traZODone (DESYREL) 150 MG tablet Take 150 mg by mouth at bedtime.    . nitroGLYCERIN (NITROSTAT) 0.4 MG SL tablet Place 1 tablet (0.4 mg total) under the tongue every 5 (five) minutes x 3 doses as needed for chest pain. If pain persist after call 911 25 tablet 6   No current facility-administered medications on file prior to visit.     ROS Review of Systems  Constitutional:  Negative.   HENT: Negative for congestion.   Eyes: Negative for visual disturbance.  Respiratory: Negative for shortness of breath.     Cardiovascular: Negative for chest pain.  Gastrointestinal: Negative for abdominal pain, constipation, diarrhea, nausea and vomiting.  Genitourinary: Negative for difficulty urinating.  Musculoskeletal: Negative for arthralgias and myalgias.  Neurological: Negative for headaches.  Psychiatric/Behavioral: Negative for sleep disturbance.    Objective:  BP 99/61   Pulse (!) 58   Temp (!) 96.8 F (36 C) (Oral)   Ht 5' 6" (1.676 m)   BMI 42.45 kg/m   BP Readings from Last 3 Encounters:  08/24/17 99/61  06/23/17 (!) 163/73  05/11/17 (!) 138/45    Wt Readings from Last 3 Encounters:  06/21/17 263 lb (119.3 kg)  06/02/17 263 lb (119.3 kg)  05/19/17 263 lb (119.3 kg)     Physical Exam  Constitutional: She is oriented to person, place, and time. She appears well-developed and well-nourished. No distress.  HENT:  Head: Normocephalic and atraumatic.  Right Ear: External ear normal.  Left Ear: External ear normal.  Nose: Nose normal.  Mouth/Throat: Oropharynx is clear and moist.  Eyes: Pupils are equal, round, and reactive to light. Conjunctivae and EOM are normal.  Neck: Normal range of motion. Neck supple. No thyromegaly present.  Cardiovascular: Normal rate, regular rhythm and normal heart sounds.  No murmur heard. Pulmonary/Chest: Effort normal and breath sounds normal. No respiratory distress. She has no wheezes. She has no rales.  Abdominal: Soft. Bowel sounds are normal. She exhibits no distension. There is no tenderness.  Lymphadenopathy:    She has no cervical adenopathy.  Neurological: She is alert and oriented to person, place, and time. She has normal reflexes.  Skin: Skin is warm and dry.  Psychiatric: She has a normal mood and affect. Her behavior is normal. Judgment and thought content normal.    Diabetic Foot Exam - Simple   No data filed        Assessment & Plan:   [name] was seen today for medical management of chronic issues.  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 -     losartan (COZAAR) 100 MG tablet; Take 1 tablet (100 mg total) by mouth daily. -     sertraline (ZOLOFT) 50 MG tablet; Take 1 tablet (50 mg total) by mouth daily. -     valsartan-hydrochlorothiazide (DIOVAN-HCT) 160-12.5 MG tablet; Take 1 tablet by mouth daily.   I have discontinued Corynn L. Ells's furosemide, loratadine, diclofenac sodium, silver sulfADIAZINE, sulfamethoxazole-trimethoprim, and mupirocin cream. I am also having her maintain her aspirin, nitroGLYCERIN, fexofenadine, MULTIVITAMIN ADULT, gabapentin, HYDROcodone-acetaminophen, traZODone, doxycycline, fenofibrate, hydrochlorothiazide, losartan, sertraline, and valsartan-hydrochlorothiazide.  Meds ordered this encounter  Medications  . fenofibrate 160 MG tablet    Sig: TAKE 1  TABLET BY MOUTH  DAILY FOR CHOLESTEROL AND  TRIGLYCERIDES    Dispense:  90 tablet    Refill:  1  . hydrochlorothiazide (MICROZIDE) 12.5 MG capsule    Sig: Take 1 capsule (12.5 mg total) by mouth daily.    Dispense:  90 capsule    Refill:  1  . losartan (COZAAR) 100 MG tablet    Sig: Take 1 tablet (100 mg total) by mouth daily.    Dispense:  90 tablet    Refill:  1  . sertraline (ZOLOFT) 50 MG tablet    Sig: Take 1 tablet (50 mg total) by mouth daily.    Dispense:  90 tablet    Refill:  1  . valsartan-hydrochlorothiazide  (DIOVAN-HCT) 160-12.5 MG tablet    Sig: Take 1 tablet by mouth daily.    Dispense:  90 tablet    Refill:  1     Follow-up: Return in about 3 months (around 11/24/2017).  [name], M.D.

## 2017-08-24 NOTE — Patient Instructions (Signed)

## 2017-08-25 DIAGNOSIS — S31105A Unspecified open wound of abdominal wall, periumbilic region without penetration into peritoneal cavity, initial encounter: Secondary | ICD-10-CM | POA: Diagnosis not present

## 2017-08-25 DIAGNOSIS — T8189XA Other complications of procedures, not elsewhere classified, initial encounter: Secondary | ICD-10-CM | POA: Diagnosis not present

## 2017-08-25 DIAGNOSIS — Z89512 Acquired absence of left leg below knee: Secondary | ICD-10-CM | POA: Diagnosis not present

## 2017-08-25 LAB — CMP14+EGFR
A/G RATIO: 1.1 — AB (ref 1.2–2.2)
ALT: 15 IU/L (ref 0–32)
AST: 19 IU/L (ref 0–40)
Albumin: 4 g/dL (ref 3.6–4.8)
Alkaline Phosphatase: 49 IU/L (ref 39–117)
BUN/Creatinine Ratio: 29 — ABNORMAL HIGH (ref 12–28)
BUN: 24 mg/dL (ref 8–27)
Bilirubin Total: 0.3 mg/dL (ref 0.0–1.2)
CALCIUM: 10.1 mg/dL (ref 8.7–10.3)
CO2: 24 mmol/L (ref 20–29)
CREATININE: 0.83 mg/dL (ref 0.57–1.00)
Chloride: 102 mmol/L (ref 96–106)
GFR, EST AFRICAN AMERICAN: 86 mL/min/{1.73_m2} (ref >59)
GFR, EST NON AFRICAN AMERICAN: 75 mL/min/{1.73_m2} (ref >59)
GLOBULIN, TOTAL: 3.7 g/dL (ref 1.5–4.5)
Glucose: 87 mg/dL (ref 65–99)
POTASSIUM: 4.4 mmol/L (ref 3.5–5.2)
SODIUM: 141 mmol/L (ref 134–144)
TOTAL PROTEIN: 7.7 g/dL (ref 6.0–8.5)

## 2017-08-25 LAB — LIPID PANEL
CHOL/HDL RATIO: 3.2 ratio (ref 0.0–4.4)
Cholesterol, Total: 135 mg/dL (ref 100–199)
HDL: 42 mg/dL (ref >39)
LDL Calculated: 69 mg/dL (ref 0–99)
TRIGLYCERIDES: 119 mg/dL (ref 0–149)
VLDL Cholesterol Cal: 24 mg/dL (ref 5–40)

## 2017-08-25 LAB — CBC WITH DIFFERENTIAL/PLATELET
Basophils Absolute: 0 10*3/uL (ref 0.0–0.2)
Basos: 0 %
EOS (ABSOLUTE): 0.3 10*3/uL (ref 0.0–0.4)
EOS: 2 %
HEMATOCRIT: 39.6 % (ref 34.0–46.6)
HEMOGLOBIN: 12.8 g/dL (ref 11.1–15.9)
IMMATURE GRANS (ABS): 0 10*3/uL (ref 0.0–0.1)
IMMATURE GRANULOCYTES: 0 %
LYMPHS: 26 %
Lymphocytes Absolute: 3 10*3/uL (ref 0.7–3.1)
MCH: 26.7 pg (ref 26.6–33.0)
MCHC: 32.3 g/dL (ref 31.5–35.7)
MCV: 83 fL (ref 79–97)
MONOCYTES: 9 %
MONOS ABS: 1 10*3/uL — AB (ref 0.1–0.9)
NEUTROS PCT: 63 %
Neutrophils Absolute: 7.2 10*3/uL — ABNORMAL HIGH (ref 1.4–7.0)
Platelets: 398 10*3/uL (ref 150–450)
RBC: 4.8 x10E6/uL (ref 3.77–5.28)
RDW: 15.6 % — AB (ref 12.3–15.4)
WBC: 11.5 10*3/uL — ABNORMAL HIGH (ref 3.4–10.8)

## 2017-08-26 DIAGNOSIS — Z87891 Personal history of nicotine dependence: Secondary | ICD-10-CM | POA: Diagnosis not present

## 2017-08-26 DIAGNOSIS — Z89512 Acquired absence of left leg below knee: Secondary | ICD-10-CM | POA: Diagnosis not present

## 2017-08-26 DIAGNOSIS — E785 Hyperlipidemia, unspecified: Secondary | ICD-10-CM | POA: Diagnosis not present

## 2017-08-26 DIAGNOSIS — Z7982 Long term (current) use of aspirin: Secondary | ICD-10-CM | POA: Diagnosis not present

## 2017-08-26 DIAGNOSIS — T8781 Dehiscence of amputation stump: Secondary | ICD-10-CM | POA: Diagnosis not present

## 2017-08-26 DIAGNOSIS — Z791 Long term (current) use of non-steroidal anti-inflammatories (NSAID): Secondary | ICD-10-CM | POA: Diagnosis not present

## 2017-08-26 DIAGNOSIS — Z79891 Long term (current) use of opiate analgesic: Secondary | ICD-10-CM | POA: Diagnosis not present

## 2017-08-26 DIAGNOSIS — I2510 Atherosclerotic heart disease of native coronary artery without angina pectoris: Secondary | ICD-10-CM | POA: Diagnosis not present

## 2017-08-26 DIAGNOSIS — I10 Essential (primary) hypertension: Secondary | ICD-10-CM | POA: Diagnosis not present

## 2017-08-28 ENCOUNTER — Encounter: Payer: Self-pay | Admitting: Family Medicine

## 2017-08-29 ENCOUNTER — Encounter (INDEPENDENT_AMBULATORY_CARE_PROVIDER_SITE_OTHER): Payer: Self-pay | Admitting: Orthopedic Surgery

## 2017-08-29 ENCOUNTER — Ambulatory Visit (INDEPENDENT_AMBULATORY_CARE_PROVIDER_SITE_OTHER): Payer: Medicare Other | Admitting: Orthopedic Surgery

## 2017-08-29 ENCOUNTER — Telehealth (INDEPENDENT_AMBULATORY_CARE_PROVIDER_SITE_OTHER): Payer: Self-pay | Admitting: Orthopedic Surgery

## 2017-08-29 ENCOUNTER — Other Ambulatory Visit (INDEPENDENT_AMBULATORY_CARE_PROVIDER_SITE_OTHER): Payer: Self-pay | Admitting: Orthopedic Surgery

## 2017-08-29 DIAGNOSIS — I10 Essential (primary) hypertension: Secondary | ICD-10-CM | POA: Diagnosis not present

## 2017-08-29 DIAGNOSIS — T8781 Dehiscence of amputation stump: Secondary | ICD-10-CM | POA: Insufficient documentation

## 2017-08-29 DIAGNOSIS — Z89512 Acquired absence of left leg below knee: Secondary | ICD-10-CM | POA: Diagnosis not present

## 2017-08-29 DIAGNOSIS — E785 Hyperlipidemia, unspecified: Secondary | ICD-10-CM | POA: Diagnosis not present

## 2017-08-29 DIAGNOSIS — Z791 Long term (current) use of non-steroidal anti-inflammatories (NSAID): Secondary | ICD-10-CM | POA: Diagnosis not present

## 2017-08-29 DIAGNOSIS — Z7982 Long term (current) use of aspirin: Secondary | ICD-10-CM | POA: Diagnosis not present

## 2017-08-29 DIAGNOSIS — Z79891 Long term (current) use of opiate analgesic: Secondary | ICD-10-CM | POA: Diagnosis not present

## 2017-08-29 DIAGNOSIS — I2510 Atherosclerotic heart disease of native coronary artery without angina pectoris: Secondary | ICD-10-CM | POA: Diagnosis not present

## 2017-08-29 DIAGNOSIS — Z87891 Personal history of nicotine dependence: Secondary | ICD-10-CM | POA: Diagnosis not present

## 2017-08-29 DIAGNOSIS — T8744 Infection of amputation stump, left lower extremity: Secondary | ICD-10-CM

## 2017-08-29 NOTE — Telephone Encounter (Signed)
error 

## 2017-08-29 NOTE — Progress Notes (Signed)
Office Visit Note   Patient: Julia West           Date of Birth: 04-28-1955           MRN: [id number] Visit Date: 08/29/2017              Requested by: [name], MD [place], [place] [address] PCP: [name], MD  Chief Complaint  Patient presents with  . Left Leg - Wound Check      HPI: Patient is a 62 year old woman status post revision of a left transtibial amputation.  She is currently undergoing treatment at home with home health nursing with a wound VAC treatment.  The wound care nurse told the patient that when she pushed around the wound pus came out of the wound on the last examination.  Assessment & Plan: Visit Diagnoses:  1. Dehiscence of amputation stump (HCC)     Plan: Discussed the patient with the tunneling wound and purulent drainage we would need to proceed with a revision of the amputation.  Risks and benefits were discussed including risk of the wound not healing.  Discussed the importance of proper protein and nutrition supplements.  Patient states she understands she states she would like to proceed with surgery she anticipates that she will need at least 3 days hospitalization and would like to try to get back into skilled nursing for another 100 days for her recovery.  Follow-Up Instructions: No follow-ups on file.   Ortho Exam  Patient is alert, oriented, no adenopathy, well-dressed, normal affect, normal respiratory effort. Examination patient has a area of about 8 cm in diameter of dermatitis from drainage from the wound.  The wound area is 4 cm in diameter and 1 cm deep there is granulation tissue however there is a deep tunneling wound.  There is clear serosanguineous drainage I do not see any purulence at this time there are also 2 other ulcers medially with drainage.  Imaging: No results found. No images are attached to the encounter.  Labs: Lab Results  Component Value Date   HGBA1C 5.4 05/09/2017   HGBA1C 5.6  02/07/2017   REPTSTATUS 06/25/2017 FINAL 06/19/2017   GRAMSTAIN  04/24/2012    NO WBC SEEN NO SQUAMOUS EPITHELIAL CELLS SEEN MODERATE GRAM POSITIVE COCCI IN PAIRS RARE GRAM NEGATIVE RODS   GRAMSTAIN  04/24/2012    NO WBC SEEN NO SQUAMOUS EPITHELIAL CELLS SEEN MODERATE GRAM POSITIVE COCCI IN PAIRS RARE GRAM NEGATIVE RODS   CULT  06/19/2017    NO GROWTH 5 DAYS Performed at [facility], [place] [address], [place], [place] [address]    [place] 04/24/2012     Lab Results  Component Value Date   ALBUMIN 4.0 08/24/2017   ALBUMIN 4.6 12/10/2016   ALBUMIN 4.4 08/20/2015    There is no height or weight on file to calculate BMI.  Orders:  No orders of the defined types were placed in this encounter.  No orders of the defined types were placed in this encounter.    Procedures: No procedures performed  Clinical Data: No additional findings.  ROS:  All other systems negative, except as noted in the HPI. Review of Systems  Objective: Vital Signs: There were no vitals taken for this visit.  Specialty Comments:  No specialty comments available.  PMFS History: Patient Active Problem List   Diagnosis Date Noted  . Dehiscence of amputation stump ([place]) 08/29/2017  . Infection of amputation stump of  left lower extremity ([place]) 06/19/2017  . Acquired absence of left leg below knee ([place]) 05/19/2017  . Bradycardia 05/07/2017  . Depression 05/07/2017  . Lymphedema 05/07/2017  . S/P BKA (below knee amputation) unilateral, left ([place]) 05/06/2017  . Gangrene of left foot ([place])   . Wound dehiscence, surgical, sequela   . Charcot foot due to diabetes mellitus ([place]) 02/09/2017  . 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 Post-menopausal bleeding 04/15/2014  . Endometrial polyp 04/09/2014  . Ventral hernia 03/06/2012  . Open abdominal wall wound 03/06/2012  . Hyperlipemia 11/12/2008  . OBESITY, MORBID 11/12/2008  . Essential  hypertension 11/12/2008  . CAD (coronary artery disease) 11/12/2008   Past Medical History:  Diagnosis Date  . Acute myocardial infarction    with a ruptured plaque in the circumflex in 2003  . Anxiety   . Cancer ([place])    basal cell face  . Complication of anesthesia    states low O2 sats post-op 11/13, always slow to awaken  . Coronary artery disease   . Degenerative joint disease   . Dyspnea    with activity- car to house - steps  . Heart murmur    "not to be concerned"  . HTN (hypertension)   . Hyperlipidemia LDL goal <70   . Internal and external hemorrhoids without complication   . Lymphedema    right side of body  . Lymphedema of arm    right  . Morbid obesity ([place])   . Surgical wound, non healing ABDOMINAL    has wound vac @ 125 mm Hg    Family History  Problem Relation Age of Onset  . Heart attack Mother   . Hypertension Sister   . Diabetes Brother   . Birth defects Son        heart defect    Past Surgical History:  Procedure Laterality Date  . AMPUTATION Left 05/06/2017   Procedure: BELOW KNEE AMPUTATION;  Surgeon: Newt Minion, MD;  Location: [facility];  Service: Orthopedics;  Laterality: Left;  . APPLICATION OF WOUND VAC    . BOWEL RESECTION  02/07/2012   Procedure: SMALL BOWEL RESECTION;  Surgeon: Gayland Curry, MD,FACS;  Location: [facility];  Service: General;;  . CARDIOVASCULAR STRESS TEST  01-17-2012  DR HOCHREIN   LOW RISK NUCLEAR TEST  . CESAREAN SECTION     x 4 in remote past  . CHOLECYSTECTOMY    . CORONARY ARTERY BYPASS GRAFT  2003   by Dr. Servando Snare. LIMA to the LAD, free RIMA to the circumflex. Stress perfusion study December 2009 with no high-risk areas of ischemia. She has a well-preserved ejection fraction  . HYSTEROSCOPY W/D&C N/A 05/14/2014   Procedure: DILATATION AND CURETTAGE (no specimen); HYSTEROSCOPY;  Surgeon: Jonnie Kind, MD;  Location: [facility];  Service: Gynecology;  Laterality: N/A;  . I&D EXTREMITY Left 06/21/2017   Procedure:  IRRIGATION AND DEBRIDEMENT OF LEFT LEG AMPUTATION SITE;  Surgeon: Wallace Going, DO;  Location: [facility];  Service: Plastics;  Laterality: Left;  . INCISION AND DRAINAGE OF WOUND  04/17/2012   Procedure: IRRIGATION AND DEBRIDEMENT WOUND;  Surgeon: Theodoro Kos, DO;  Location: [facility];  Service: Plastics;  Laterality: N/A;  OF ABDOMINAL WOUND, SURGICAL PREP AND PLACEMENT OF VAC, REMOVAL FOREHEAD SKIN LESION  . INCISION AND DRAINAGE OF WOUND N/A 04/24/2012   Procedure: IRRIGATION AND DEBRIDEMENT WOUND;  Surgeon: Theodoro Kos, DO;  Location: [facility];  Service: Clinical cytogeneticist;  Laterality: N/A;  I & D ABDOMINAL WOUND WITH VAC AND ACELL  . INCISION AND DRAINAGE OF WOUND N/A 05/01/2012   Procedure: IRRIGATION AND DEBRIDEMENT WOUND;  Surgeon: Theodoro Kos, DO;  Location: [facility];  Service: Plastics;  Laterality: N/A;  WITH SURGICAL PREP AND PLACEMENT OF VAC  . INCISION AND DRAINAGE OF WOUND N/A 05/08/2012   Procedure: IRRIGATION AND DEBRIDEMENT OF ABD WOUND SURGICAL PREP AND PLACEMENT OF VAC ;  Surgeon: Theodoro Kos, DO;  Location: [facility];  Service: Plastics;  Laterality: N/A;  IRRIGATION AND DEBRIDEMENT OF ABD WOUND SURGICAL PREP AND PLACEMENT OF VAC   . INCISION AND DRAINAGE OF WOUND N/A 05/15/2012   Procedure: IRRIGATION AND DEBRIDEMENT OF ABDOMINAL WOUND WITH POSSIBLE SURGICAL PREP AND PLACEMENT OF VAC;  Surgeon: Theodoro Kos, DO;  Location: [facility];  Service: Plastics;  Laterality: N/A;  . INCISION AND DRAINAGE OF WOUND N/A 05/22/2012   Procedure: IRRIGATION AND DEBRIDEMENT OF ABODOMINAL WOUND WITH  SURGICAL PREP AND VAC PLACEMENT;  Surgeon: Theodoro Kos, DO;  Location: [facility];  Service: Plastics;  Laterality: N/A;  . INCISION AND DRAINAGE OF WOUND N/A 06/28/2012   Procedure: IRRIGATION AND DEBRIDEMENT OF ABDOMINAL ULCER SURGICAL PREP AND PLACEMENT OF ACELL AND VAC;  Surgeon: Theodoro Kos, DO;   Location: [facility];  Service: Plastics;  Laterality: N/A;  . INCISIONAL HERNIA REPAIR  02/07/2012   Procedure: HERNIA REPAIR INCISIONAL;  Surgeon: Gayland Curry, MD,FACS;  Location: [facility];  Service: General;;  Open, Primary repair, strangulated Incisional hernia.  Marland Kitchen LESION REMOVAL  04/17/2012   Procedure: LESION REMOVAL;  Surgeon: Theodoro Kos, DO;  Location: [facility];  Service: Plastics;  Laterality: N/A;   CENTER OF FOREHEAD, REMOVAL FORHEAD SKIN LESION  . ORIF TOE FRACTURE Left 02/09/2017   Procedure: EXCISION TALAR HEAD, INTERNAL FIXATION MEDIAL COLUMN LEFT FOOT;  Surgeon: Newt Minion, MD;  Location: [facility];  Service: Orthopedics;  Laterality: Left;  . POLYPECTOMY N/A 05/14/2014   Procedure: ENDOMETRIAL POLYPECTOMY;  Surgeon: Jonnie Kind, MD;  Location: [facility];  Service: Gynecology;  Laterality: N/A;   Social History   Occupational History  . Occupation: Estate manager/land agent    Comment: In the [employer]  Tobacco Use  . Smoking status: Former Smoker    Packs/day: 1.00    Years: 25.00    Pack years: 25.00    Types: Cigarettes    Last attempt to quit: 11/17/2011    Years since quitting: 5.7  . Smokeless tobacco: Never Used  Substance and Sexual Activity  . Alcohol use: No  . Drug use: No  . Sexual activity: Yes    Birth control/protection: Post-menopausal

## 2017-08-30 ENCOUNTER — Encounter (HOSPITAL_COMMUNITY): Payer: Self-pay | Admitting: *Deleted

## 2017-08-30 ENCOUNTER — Other Ambulatory Visit: Payer: Self-pay

## 2017-08-30 ENCOUNTER — Other Ambulatory Visit: Payer: Self-pay | Admitting: Family Medicine

## 2017-08-30 MED ORDER — DEXTROSE 5 % IV SOLN
3.0000 g | INTRAVENOUS | Status: AC
  Administered 2017-08-31: 3 g via INTRAVENOUS
  Filled 2017-08-30: qty 3

## 2017-08-30 NOTE — Progress Notes (Signed)
Anesthesia Chart Review: Julia West  Case:  [id number] Date/Time:  08/31/17 1235   Procedure:  REVISION LEFT BELOW KNEE AMPUTATION (Left )   Anesthesia type:  General   Pre-op diagnosis:  Dehiscence Left Below Knee Amputation   Location:  [facility] OR ROOM 07 / [facility]   Surgeon:  Newt Minion, MD      DISCUSSION: Patient is a 62 year old female scheduled for the above procedure. 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 Bradycardia (b-blocker discontinued 05/07/17). Reports she had post-operative hypoxemia after 01/2012 and is slow to awaken from anesthesia. BMI is consistent with morbid obesity.   Last seen by cardiology 05/07/17 with no additional testing recommended at that time (see below). She is a same day work-up, so further evaluation by her anesthesiologist on the day of surgery to ensure no acute changes prior to proceeding with planned surgery.  PROVIDERSClaretta Fraise, MD is PCP. Last visit 08/24/17. - Cardiologist is Dr. [name]. Last encounter was with Dr. Candee Furbish on 05/07/17 for consultation for bradycardia during left BKA admission. Her b-blocker had already been decreased at her 02/08/17 preoperative cardiology visit, but he discontinued it all together given on-going bradycardia. No further cardiac work-up recommended at that time.    LABS: Labs from  08/24/17 noted. Cr 0.83. Glucose 87. AST/ALT WNL. WBC 11.5. H/H 12.8/39.6. PLT 398. A1c 5.3.    EKG: 05/07/17: SB at 48 bpm with occasional PVCs, LVH, possible anterior infarct (age undetermined). Poor r wave progression, single PVC, negative T in lead III, and non-specific T wave flattening in V3-6 new when comared to 02/07/17 tracing.    CV: Echo 06/20/17: Study Conclusions - Left ventricle: The cavity size was normal. Wall thickness was   normal. Systolic function was normal. The estimated ejection   fraction was in the range of 60% to 65%. Wall motion was normal;   there were no regional wall motion abnormalities. The study is   not technically sufficient to allow evaluation of LV diastolic   function. - Left atrium: The atrium was moderately dilated.  Nuclear stress test 01/17/12: IMPRESSION: Equivocal pharmacologic stress nuclear myocardial study revealing mild left ventricular dilatation, a small segmental wall motion abnormality with a normal ejection fraction, and no significant stress-induced EKG abnormalities. On scintigraphic imaging, there were questionable small areas of ischemia in the distal inferolateral and basilar inferior segments, perhaps with some superimposed scarring. Other findings as noted. (Dr. Percival Spanish reviewed and felt that overall scan was low risk. Patient was asymptomatic. He cleared patient for a surgical procedure at that time.)  Last LHC seen was prior to 2003 CABG.   Past Medical History:  Diagnosis Date  . Acute myocardial infarction    with a ruptured plaque in the circumflex in 2003  . Anxiety   . Cancer ([place])    basal cell face  . Complication of anesthesia    states low O2 sats post-op 11/13, always slow to awaken  .  Coronary artery disease   . Degenerative joint disease   . 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 Dyspnea    with activity- car to house - steps  . Heart murmur    "not to be concerned"  . HTN  (hypertension)   . Hyperlipidemia LDL goal <70   . Internal and external hemorrhoids without complication   . Lymphedema    right side of body  . Lymphedema of arm    right  . Morbid obesity ([place])   . Surgical wound, non healing ABDOMINAL    has wound vac @ 125 mm Hg    Past Surgical History:  Procedure Laterality Date  . AMPUTATION Left 05/06/2017   Procedure: BELOW KNEE AMPUTATION;  Surgeon: Newt Minion, MD;  Location: [place];  Service: Orthopedics;  Laterality: Left;  . APPLICATION OF WOUND VAC    . BOWEL RESECTION  02/07/2012   Procedure: SMALL BOWEL RESECTION;  Surgeon: Gayland Curry, MD,FACS;  Location: [place];  Service: General;;  . CARDIOVASCULAR STRESS TEST  01-17-2012  DR HOCHREIN   LOW RISK NUCLEAR TEST  . CESAREAN SECTION     x 4 in remote past  . CHOLECYSTECTOMY    . CORONARY ARTERY BYPASS GRAFT  2003   by Dr. Servando Snare. LIMA to the LAD, free RIMA to the circumflex. Stress perfusion study December 2009 with no high-risk areas of ischemia. She has a well-preserved ejection fraction  . HYSTEROSCOPY W/D&C N/A 05/14/2014   Procedure: DILATATION AND CURETTAGE (no specimen); HYSTEROSCOPY;  Surgeon: Jonnie Kind, MD;  Location[facility];  Service: Gynecology;  Laterality: N/A;  . I&D EXTREMITY Left 06/21/2017   Procedure: IRRIGATION AND DEBRIDEMENT OF LEFT LEG AMPUTATION SITE;  Surgeon: Wallace Going, DO;  [facility]: [facility];  Service: Plastics;  Laterality: Left;  . INCISION AND DRAINAGE OF WOUND  04/17/2012   Procedure: IRRIGATION AND DEBRIDEMENT WOUND;  Surgeon: Theodoro Kos, DO;  Location: [place];  Service: Plastics;  Laterality: N/A;  OF ABDOMINAL WOUND, SURGICAL PREP AND PLACEMENT OF VAC, REMOVAL FOREHEAD SKIN LESION  . INCISION AND DRAINAGE OF WOUND N/A 04/24/2012   Procedure: IRRIGATION AND DEBRIDEMENT WOUND;  Surgeon: Theodoro Kos, DO;  Location: [place];  Service: Plastics;  Laterality: N/A;  I & D ABDOMINAL WOUND WITH VAC AND  ACELL  . INCISION AND DRAINAGE OF WOUND N/A 05/01/2012   Procedure: IRRIGATION AND DEBRIDEMENT WOUND;  Surgeon: Theodoro Kos, DO;  Location: [place];  Service: Plastics;  Laterality: N/A;  WITH SURGICAL PREP AND PLACEMENT OF VAC  . INCISION AND DRAINAGE OF WOUND N/A 05/08/2012   Procedure: IRRIGATION AND DEBRIDEMENT OF ABD WOUND SURGICAL PREP AND PLACEMENT OF VAC ;  Surgeon: Theodoro Kos, DO;  Location: [place];  Service: Plastics;  Laterality: N/A;  IRRIGATION AND DEBRIDEMENT OF ABD WOUND SURGICAL PREP AND PLACEMENT OF VAC   . INCISION AND DRAINAGE OF WOUND N/A 05/15/2012   Procedure: IRRIGATION AND DEBRIDEMENT OF ABDOMINAL WOUND WITH POSSIBLE SURGICAL PREP AND PLACEMENT OF VAC;  Surgeon: Theodoro Kos, DO;  Location: [place];  Service: Plastics;  Laterality: N/A;  . INCISION AND DRAINAGE OF WOUND N/A 05/22/2012   Procedure: IRRIGATION AND DEBRIDEMENT OF ABODOMINAL WOUND WITH  SURGICAL PREP AND VAC PLACEMENT;  Surgeon: Theodoro Kos, DO;  Location: [place];  Service: Plastics;  Laterality: N/A;  . INCISION AND DRAINAGE OF WOUND N/A 06/28/2012  Procedure: IRRIGATION AND DEBRIDEMENT OF ABDOMINAL ULCER SURGICAL PREP AND PLACEMENT OF ACELL AND VAC;  Surgeon: Theodoro Kos, DO;  Location: [place];  Service: Plastics;  Laterality: N/A;  . INCISIONAL HERNIA REPAIR  02/07/2012   Procedure: HERNIA REPAIR INCISIONAL;  Surgeon: Gayland Curry, MD,FACS;  Location: [place];  Service: General;;  Open, Primary repair, strangulated Incisional hernia.  Marland Kitchen LESION REMOVAL  04/17/2012   Procedure: LESION REMOVAL;  Surgeon: Theodoro Kos, DO;  Location: [place];  Service: Plastics;  Laterality: N/A;   CENTER OF FOREHEAD, REMOVAL FORHEAD SKIN LESION  . ORIF TOE FRACTURE Left 02/09/2017   Procedure: EXCISION TALAR HEAD, INTERNAL FIXATION MEDIAL COLUMN LEFT FOOT;  Surgeon: Newt Minion, MD;  Location: [place];  Service:  Orthopedics;  Laterality: Left;  . POLYPECTOMY N/A 05/14/2014   Procedure: ENDOMETRIAL POLYPECTOMY;  Surgeon: Jonnie Kind, MD;  Location[facility];  Service: Gynecology;  Laterality: N/A;    MEDICATIONS: . [START ON 08/31/2017] ceFAZolin (ANCEF) 3 g in dextrose 5 % 50 mL IVPB   . aspirin 81 MG tablet  . doxycycline (VIBRA-TABS) 100 MG tablet  . fenofibrate 160 MG tablet  . fexofenadine (ALLEGRA) 180 MG tablet  . gabapentin (NEURONTIN) 100 MG capsule  . hydrochlorothiazide (MICROZIDE) 12.5 MG capsule  . HYDROcodone-acetaminophen (NORCO) 7.5-325 MG tablet  . losartan (COZAAR) 100 MG tablet  . Multiple Vitamins-Minerals (MULTIVITAMIN ADULT) TABS  . nitroGLYCERIN (NITROSTAT) 0.4 MG SL tablet  . sertraline (ZOLOFT) 50 MG tablet  . traZODone (DESYREL) 150 MG tablet  . valsartan-hydrochlorothiazide (DIOVAN-HCT) 160-12.5 MG tablet    [facility] Short Stay Center/Anesthesiology Phone [phone] 08/30/2017 12:06 PM

## 2017-08-30 NOTE — Progress Notes (Signed)
Pt denies any acute cardiopulmonary issues. Pt under the care of Dr. Percival Spanish, Cardiology. Pt made aware to stop taking vitamins, fish oil and herbal medications. Do not take any NSAIDs ie: Ibuprofen, Advil, Naproxen (Aleve), Motrin, BC and Goody Powder. Pt verbalized understanding of all pre-op instructions. Anesthesia asked to review recent EKG.

## 2017-08-31 ENCOUNTER — Other Ambulatory Visit: Payer: Self-pay

## 2017-08-31 ENCOUNTER — Inpatient Hospital Stay (HOSPITAL_COMMUNITY): Payer: Medicare Other | Admitting: Vascular Surgery

## 2017-08-31 ENCOUNTER — Encounter (HOSPITAL_COMMUNITY)
Admission: RE | Disposition: A | Discharge status: Home / Self Care | Payer: Self-pay | Source: Home / Self Care | Attending: Orthopedic Surgery

## 2017-08-31 ENCOUNTER — Encounter (HOSPITAL_COMMUNITY): Payer: Self-pay | Admitting: Urology

## 2017-08-31 ENCOUNTER — Inpatient Hospital Stay (HOSPITAL_COMMUNITY)
Admission: RE | Admit: 2017-08-31 | Discharge: 2017-09-06 | DRG: 475 | Disposition: A | Discharge status: Home / Self Care | Payer: Medicare Other | Attending: Orthopedic Surgery | Admitting: Orthopedic Surgery

## 2017-08-31 DIAGNOSIS — Z85828 Personal history of other malignant neoplasm of skin: Secondary | ICD-10-CM

## 2017-08-31 DIAGNOSIS — F419 Anxiety disorder, unspecified: Secondary | ICD-10-CM | POA: Diagnosis present

## 2017-08-31 DIAGNOSIS — I252 Old myocardial infarction: Secondary | ICD-10-CM

## 2017-08-31 DIAGNOSIS — Z951 Presence of aortocoronary bypass graft: Secondary | ICD-10-CM | POA: Diagnosis not present

## 2017-08-31 DIAGNOSIS — Z79899 Other long term (current) drug therapy: Secondary | ICD-10-CM | POA: Diagnosis not present

## 2017-08-31 DIAGNOSIS — I493 Ventricular premature depolarization: Secondary | ICD-10-CM | POA: Diagnosis not present

## 2017-08-31 DIAGNOSIS — Z833 Family history of diabetes mellitus: Secondary | ICD-10-CM

## 2017-08-31 DIAGNOSIS — F329 Major depressive disorder, single episode, unspecified: Secondary | ICD-10-CM | POA: Diagnosis present

## 2017-08-31 DIAGNOSIS — Z89512 Acquired absence of left leg below knee: Secondary | ICD-10-CM

## 2017-08-31 DIAGNOSIS — Z79891 Long term (current) use of opiate analgesic: Secondary | ICD-10-CM | POA: Diagnosis not present

## 2017-08-31 DIAGNOSIS — Z6841 Body Mass Index (BMI) 40.0 and over, adult: Secondary | ICD-10-CM | POA: Diagnosis not present

## 2017-08-31 DIAGNOSIS — M1990 Unspecified osteoarthritis, unspecified site: Secondary | ICD-10-CM | POA: Diagnosis present

## 2017-08-31 DIAGNOSIS — R6883 Chills (without fever): Secondary | ICD-10-CM | POA: Diagnosis not present

## 2017-08-31 DIAGNOSIS — Z87891 Personal history of nicotine dependence: Secondary | ICD-10-CM | POA: Diagnosis not present

## 2017-08-31 DIAGNOSIS — Y835 Amputation of limb(s) as the cause of abnormal reaction of the patient, or of later complication, without mention of misadventure at the time of the procedure: Secondary | ICD-10-CM | POA: Diagnosis present

## 2017-08-31 DIAGNOSIS — R011 Cardiac murmur, unspecified: Secondary | ICD-10-CM | POA: Diagnosis not present

## 2017-08-31 DIAGNOSIS — R2681 Unsteadiness on feet: Secondary | ICD-10-CM | POA: Diagnosis not present

## 2017-08-31 DIAGNOSIS — Z8249 Family history of ischemic heart disease and other diseases of the circulatory system: Secondary | ICD-10-CM | POA: Diagnosis not present

## 2017-08-31 DIAGNOSIS — Z888 Allergy status to other drugs, medicaments and biological substances status: Secondary | ICD-10-CM | POA: Diagnosis not present

## 2017-08-31 DIAGNOSIS — D72829 Elevated white blood cell count, unspecified: Secondary | ICD-10-CM | POA: Diagnosis not present

## 2017-08-31 DIAGNOSIS — T8744 Infection of amputation stump, left lower extremity: Secondary | ICD-10-CM | POA: Diagnosis present

## 2017-08-31 DIAGNOSIS — B952 Enterococcus as the cause of diseases classified elsewhere: Secondary | ICD-10-CM | POA: Diagnosis not present

## 2017-08-31 DIAGNOSIS — I10 Essential (primary) hypertension: Secondary | ICD-10-CM | POA: Diagnosis not present

## 2017-08-31 DIAGNOSIS — Z7982 Long term (current) use of aspirin: Secondary | ICD-10-CM | POA: Diagnosis not present

## 2017-08-31 DIAGNOSIS — Z872 Personal history of diseases of the skin and subcutaneous tissue: Secondary | ICD-10-CM | POA: Diagnosis not present

## 2017-08-31 DIAGNOSIS — T8781 Dehiscence of amputation stump: Principal | ICD-10-CM | POA: Diagnosis present

## 2017-08-31 DIAGNOSIS — T8189XA Other complications of procedures, not elsewhere classified, initial encounter: Secondary | ICD-10-CM | POA: Diagnosis not present

## 2017-08-31 DIAGNOSIS — Z9049 Acquired absence of other specified parts of digestive tract: Secondary | ICD-10-CM

## 2017-08-31 DIAGNOSIS — IMO0002 Reserved for concepts with insufficient information to code with codable children: Secondary | ICD-10-CM

## 2017-08-31 DIAGNOSIS — K5900 Constipation, unspecified: Secondary | ICD-10-CM | POA: Diagnosis not present

## 2017-08-31 DIAGNOSIS — I2510 Atherosclerotic heart disease of native coronary artery without angina pectoris: Secondary | ICD-10-CM | POA: Diagnosis present

## 2017-08-31 DIAGNOSIS — T8130XA Disruption of wound, unspecified, initial encounter: Secondary | ICD-10-CM | POA: Diagnosis present

## 2017-08-31 DIAGNOSIS — E785 Hyperlipidemia, unspecified: Secondary | ICD-10-CM | POA: Diagnosis present

## 2017-08-31 DIAGNOSIS — S81009A Unspecified open wound, unspecified knee, initial encounter: Secondary | ICD-10-CM | POA: Diagnosis not present

## 2017-08-31 HISTORY — DX: Dehiscence of amputation stump: T87.81

## 2017-08-31 HISTORY — PX: STUMP REVISION: SHX6102

## 2017-08-31 SURGERY — REVISION, AMPUTATION SITE
Anesthesia: General | Laterality: Left

## 2017-08-31 MED ORDER — VALSARTAN-HYDROCHLOROTHIAZIDE 160-12.5 MG PO TABS: 1.0000 | ORAL_TABLET | Freq: Every day | ORAL | Status: DC

## 2017-08-31 MED ORDER — HYDROCHLOROTHIAZIDE 12.5 MG PO CAPS
12.5000 mg | ORAL_CAPSULE | Freq: Every day | ORAL | Status: DC
  Administered 2017-08-31 – 2017-09-06 (×7): 12.5 mg via ORAL
  Filled 2017-08-31 (×7): qty 1

## 2017-08-31 MED ORDER — ONDANSETRON HCL 4 MG PO TABS: 4.0000 mg | ORAL_TABLET | Freq: Four times a day (QID) | ORAL | Status: DC | PRN

## 2017-08-31 MED ORDER — 0.9 % SODIUM CHLORIDE (POUR BTL) OPTIME
TOPICAL | Status: DC | PRN
  Administered 2017-08-31: 1000 mL

## 2017-08-31 MED ORDER — OXYCODONE HCL 5 MG PO TABS
5.0000 mg | ORAL_TABLET | ORAL | Status: DC | PRN
  Administered 2017-09-01 (×4): 10 mg via ORAL
  Administered 2017-09-01: 5 mg via ORAL
  Administered 2017-09-02 – 2017-09-03 (×5): 10 mg via ORAL
  Administered 2017-09-04 (×2): 5 mg via ORAL
  Administered 2017-09-05 – 2017-09-06 (×3): 10 mg via ORAL
  Filled 2017-08-31: qty 1
  Filled 2017-08-31 (×5): qty 2
  Filled 2017-08-31: qty 1
  Filled 2017-08-31 (×3): qty 2
  Filled 2017-08-31 (×2): qty 1

## 2017-08-31 MED ORDER — METHOCARBAMOL 1000 MG/10ML IJ SOLN
500.0000 mg | Freq: Four times a day (QID) | INTRAVENOUS | Status: DC | PRN
  Filled 2017-08-31: qty 5

## 2017-08-31 MED ORDER — ONDANSETRON HCL 4 MG/2ML IJ SOLN: 4.0000 mg | Freq: Four times a day (QID) | INTRAMUSCULAR | Status: DC | PRN

## 2017-08-31 MED ORDER — LACTATED RINGERS IV SOLN
INTRAVENOUS | Status: DC
  Administered 2017-08-31: 11:00:00 via INTRAVENOUS

## 2017-08-31 MED ORDER — METOCLOPRAMIDE HCL 5 MG/ML IJ SOLN: 5.0000 mg | Freq: Three times a day (TID) | INTRAMUSCULAR | Status: DC | PRN

## 2017-08-31 MED ORDER — DOCUSATE SODIUM 100 MG PO CAPS
100.0000 mg | ORAL_CAPSULE | Freq: Two times a day (BID) | ORAL | Status: DC
  Administered 2017-08-31 – 2017-09-06 (×11): 100 mg via ORAL
  Filled 2017-08-31 (×11): qty 1

## 2017-08-31 MED ORDER — ACETAMINOPHEN 325 MG PO TABS
325.0000 mg | ORAL_TABLET | Freq: Four times a day (QID) | ORAL | Status: DC | PRN
  Administered 2017-08-31 – 2017-09-01 (×2): 650 mg via ORAL
  Filled 2017-08-31 (×3): qty 2

## 2017-08-31 MED ORDER — METOCLOPRAMIDE HCL 5 MG PO TABS: 5.0000 mg | ORAL_TABLET | Freq: Three times a day (TID) | ORAL | Status: DC | PRN

## 2017-08-31 MED ORDER — MIDAZOLAM HCL 2 MG/2ML IJ SOLN
INTRAMUSCULAR | Status: DC | PRN
  Administered 2017-08-31: 2 mg via INTRAVENOUS

## 2017-08-31 MED ORDER — OXYCODONE HCL 5 MG PO TABS
5.0000 mg | ORAL_TABLET | Freq: Once | ORAL | Status: AC | PRN
  Administered 2017-08-31 (×2): 5 mg via ORAL

## 2017-08-31 MED ORDER — FENTANYL CITRATE (PF) 100 MCG/2ML IJ SOLN
25.0000 ug | INTRAMUSCULAR | Status: DC | PRN
  Administered 2017-08-31 (×4): 25 ug via INTRAVENOUS

## 2017-08-31 MED ORDER — LACTATED RINGERS IV SOLN
INTRAVENOUS | Status: DC | PRN
  Administered 2017-08-31: 14:00:00 via INTRAVENOUS

## 2017-08-31 MED ORDER — FENTANYL CITRATE (PF) 250 MCG/5ML IJ SOLN
INTRAMUSCULAR | Status: AC
Start: 2017-08-31 — End: ?
  Filled 2017-08-31: qty 5

## 2017-08-31 MED ORDER — MIDAZOLAM HCL 2 MG/2ML IJ SOLN
INTRAMUSCULAR | Status: AC
  Filled 2017-08-31: qty 2

## 2017-08-31 MED ORDER — ACETAMINOPHEN 325 MG PO TABS: 325.0000 mg | ORAL_TABLET | ORAL | Status: DC | PRN

## 2017-08-31 MED ORDER — POLYETHYLENE GLYCOL 3350 17 G PO PACK
17.0000 g | PACK | Freq: Every day | ORAL | Status: DC | PRN
  Administered 2017-09-02: 17 g via ORAL
  Filled 2017-08-31: qty 1

## 2017-08-31 MED ORDER — BISACODYL 10 MG RE SUPP: 10.0000 mg | Freq: Every day | RECTAL | Status: DC | PRN

## 2017-08-31 MED ORDER — ACETAMINOPHEN 160 MG/5ML PO SOLN: 325.0000 mg | ORAL | Status: DC | PRN

## 2017-08-31 MED ORDER — GABAPENTIN 100 MG PO CAPS
100.0000 mg | ORAL_CAPSULE | Freq: Every day | ORAL | Status: DC
  Administered 2017-08-31 – 2017-09-05 (×6): 100 mg via ORAL
  Filled 2017-08-31 (×6): qty 1

## 2017-08-31 MED ORDER — MORPHINE SULFATE (PF) 2 MG/ML IV SOLN
2.0000 mg | INTRAVENOUS | Status: DC | PRN
  Administered 2017-08-31 – 2017-09-01 (×4): 2 mg via INTRAVENOUS
  Filled 2017-08-31 (×3): qty 1

## 2017-08-31 MED ORDER — TRAZODONE HCL 100 MG PO TABS
150.0000 mg | ORAL_TABLET | Freq: Every day | ORAL | Status: DC
  Administered 2017-08-31 – 2017-09-05 (×6): 150 mg via ORAL
  Filled 2017-08-31 (×6): qty 1

## 2017-08-31 MED ORDER — OXYCODONE HCL 5 MG/5ML PO SOLN: 5.0000 mg | Freq: Once | ORAL | Status: AC | PRN

## 2017-08-31 MED ORDER — FENTANYL CITRATE (PF) 100 MCG/2ML IJ SOLN
INTRAMUSCULAR | Status: AC
  Filled 2017-08-31: qty 2

## 2017-08-31 MED ORDER — METHOCARBAMOL 500 MG PO TABS
500.0000 mg | ORAL_TABLET | Freq: Four times a day (QID) | ORAL | Status: DC | PRN
  Administered 2017-08-31 – 2017-09-06 (×9): 500 mg via ORAL
  Filled 2017-08-31 (×10): qty 1

## 2017-08-31 MED ORDER — MAGNESIUM CITRATE PO SOLN
1.0000 | Freq: Once | ORAL | Status: AC | PRN
Start: 2017-08-31 — End: 2017-09-03
  Administered 2017-09-03: 1 via ORAL
  Filled 2017-08-31: qty 296

## 2017-08-31 MED ORDER — CEFAZOLIN SODIUM-DEXTROSE 2-4 GM/100ML-% IV SOLN
2.0000 g | Freq: Four times a day (QID) | INTRAVENOUS | Status: AC
  Administered 2017-08-31 – 2017-09-01 (×3): 2 g via INTRAVENOUS
  Filled 2017-08-31 (×3): qty 100

## 2017-08-31 MED ORDER — ONDANSETRON HCL 4 MG/2ML IJ SOLN
INTRAMUSCULAR | Status: DC | PRN
  Administered 2017-08-31: 4 mg via INTRAVENOUS

## 2017-08-31 MED ORDER — FENOFIBRATE 160 MG PO TABS
160.0000 mg | ORAL_TABLET | Freq: Every day | ORAL | Status: DC
  Administered 2017-09-01 – 2017-09-06 (×6): 160 mg via ORAL
  Filled 2017-08-31 (×6): qty 1

## 2017-08-31 MED ORDER — SERTRALINE HCL 50 MG PO TABS
50.0000 mg | ORAL_TABLET | Freq: Every day | ORAL | Status: DC
  Administered 2017-09-01 – 2017-09-06 (×6): 50 mg via ORAL
  Filled 2017-08-31 (×7): qty 1

## 2017-08-31 MED ORDER — HYDROMORPHONE HCL 2 MG/ML IJ SOLN: 0.5000 mg | INTRAMUSCULAR | Status: DC | PRN

## 2017-08-31 MED ORDER — LIDOCAINE 2% (20 MG/ML) 5 ML SYRINGE
INTRAMUSCULAR | Status: DC | PRN
  Administered 2017-08-31: 60 mg via INTRAVENOUS

## 2017-08-31 MED ORDER — CHLORHEXIDINE GLUCONATE 4 % EX LIQD: 60.0000 mL | Freq: Once | CUTANEOUS | Status: DC

## 2017-08-31 MED ORDER — ASPIRIN EC 81 MG PO TBEC
81.0000 mg | DELAYED_RELEASE_TABLET | Freq: Every day | ORAL | Status: DC
  Administered 2017-09-01 – 2017-09-06 (×6): 81 mg via ORAL
  Filled 2017-08-31 (×6): qty 1

## 2017-08-31 MED ORDER — MORPHINE SULFATE (PF) 2 MG/ML IV SOLN
INTRAVENOUS | Status: AC
  Administered 2017-08-31: 2 mg via INTRAVENOUS
  Filled 2017-08-31: qty 1

## 2017-08-31 MED ORDER — OXYCODONE HCL 5 MG PO TABS
10.0000 mg | ORAL_TABLET | ORAL | Status: DC | PRN
  Administered 2017-08-31: 15 mg via ORAL
  Filled 2017-08-31 (×2): qty 2
  Filled 2017-08-31: qty 3
  Filled 2017-08-31 (×2): qty 2

## 2017-08-31 MED ORDER — FENTANYL CITRATE (PF) 250 MCG/5ML IJ SOLN
INTRAMUSCULAR | Status: DC | PRN
  Administered 2017-08-31: 100 ug via INTRAVENOUS
  Administered 2017-08-31 (×3): 50 ug via INTRAVENOUS

## 2017-08-31 MED ORDER — OXYCODONE HCL 5 MG PO TABS
ORAL_TABLET | ORAL | Status: AC
  Administered 2017-08-31: 5 mg via ORAL
  Filled 2017-08-31: qty 1

## 2017-08-31 MED ORDER — SODIUM CHLORIDE 0.9 % IV SOLN
INTRAVENOUS | Status: DC
  Administered 2017-08-31: 20:00:00 via INTRAVENOUS

## 2017-08-31 MED ORDER — PROPOFOL 10 MG/ML IV BOLUS
INTRAVENOUS | Status: DC | PRN
  Administered 2017-08-31: 120 mg via INTRAVENOUS
  Administered 2017-08-31: 30 mg via INTRAVENOUS

## 2017-08-31 MED ORDER — IRBESARTAN 150 MG PO TABS
150.0000 mg | ORAL_TABLET | Freq: Every day | ORAL | Status: DC
  Administered 2017-08-31 – 2017-09-06 (×7): 150 mg via ORAL
  Filled 2017-08-31 (×7): qty 1

## 2017-08-31 MED ORDER — SUGAMMADEX SODIUM 200 MG/2ML IV SOLN
INTRAVENOUS | Status: DC | PRN
  Administered 2017-08-31: 200 mg via INTRAVENOUS

## 2017-08-31 MED ORDER — ROCURONIUM BROMIDE 10 MG/ML (PF) SYRINGE
PREFILLED_SYRINGE | INTRAVENOUS | Status: DC | PRN
Start: 2017-08-31 — End: 2017-08-31
  Administered 2017-08-31: 40 mg via INTRAVENOUS
  Administered 2017-08-31: 10 mg via INTRAVENOUS

## 2017-08-31 MED ORDER — DEXAMETHASONE SODIUM PHOSPHATE 10 MG/ML IJ SOLN
INTRAMUSCULAR | Status: DC | PRN
  Administered 2017-08-31: 10 mg via INTRAVENOUS

## 2017-08-31 MED ORDER — NITROGLYCERIN 0.4 MG SL SUBL: 0.4000 mg | SUBLINGUAL_TABLET | SUBLINGUAL | Status: DC | PRN

## 2017-08-31 SURGICAL SUPPLY — 30 items
BLADE SAW RECIP 87.9 MT (BLADE) ×3 IMPLANT
BLADE SURG 21 STRL SS (BLADE) ×3 IMPLANT
COVER SURGICAL LIGHT HANDLE (MISCELLANEOUS) ×3 IMPLANT
DRAPE EXTREMITY T 121X128X90 (DRAPE) ×3 IMPLANT
DRAPE HALF SHEET 40X57 (DRAPES) ×3 IMPLANT
DRAPE INCISE IOBAN 66X45 STRL (DRAPES) ×3 IMPLANT
DRAPE U-SHAPE 47X51 STRL (DRAPES) ×3 IMPLANT
DRESSING PREVENA PLUS CUSTOM (GAUZE/BANDAGES/DRESSINGS) ×1 IMPLANT
DRSG PREVENA PLUS CUSTOM (GAUZE/BANDAGES/DRESSINGS) ×3
DRSG VAC ATS MED SENSATRAC (GAUZE/BANDAGES/DRESSINGS) ×3 IMPLANT
DURAPREP 26ML APPLICATOR (WOUND CARE) ×3 IMPLANT
ELECT REM PT RETURN 9FT ADLT (ELECTROSURGICAL) ×3
ELECTRODE REM PT RTRN 9FT ADLT (ELECTROSURGICAL) ×1 IMPLANT
GLOVE BIOGEL PI IND STRL 9 (GLOVE) ×1 IMPLANT
GLOVE BIOGEL PI INDICATOR 9 (GLOVE) ×2
GLOVE SURG ORTHO 9.0 STRL STRW (GLOVE) ×3 IMPLANT
GOWN STRL REUS W/ TWL XL LVL3 (GOWN DISPOSABLE) ×2 IMPLANT
GOWN STRL REUS W/TWL XL LVL3 (GOWN DISPOSABLE) ×4
KIT BASIN OR (CUSTOM PROCEDURE TRAY) ×3 IMPLANT
KIT TURNOVER KIT B (KITS) ×3 IMPLANT
MANIFOLD NEPTUNE II (INSTRUMENTS) IMPLANT
NS IRRIG 1000ML POUR BTL (IV SOLUTION) ×3 IMPLANT
PACK GENERAL/GYN (CUSTOM PROCEDURE TRAY) ×3 IMPLANT
PAD ARMBOARD 7.5X6 YLW CONV (MISCELLANEOUS) ×3 IMPLANT
PAD NEG PRESSURE SENSATRAC (MISCELLANEOUS) IMPLANT
STAPLER VISISTAT 35W (STAPLE) IMPLANT
SUT ETHILON 2 0 PSLX (SUTURE) ×3 IMPLANT
SUT SILK 2 0 (SUTURE)
SUT SILK 2-0 18XBRD TIE 12 (SUTURE) IMPLANT
TOWEL OR 17X26 10 PK STRL BLUE (TOWEL DISPOSABLE) ×3 IMPLANT

## 2017-08-31 NOTE — Anesthesia Preprocedure Evaluation (Signed)
Anesthesia Evaluation  Patient identified by MRN, date of birth, ID band Patient awake    Reviewed: Allergy & Precautions, NPO status , Patient's Chart, lab work & pertinent test results, reviewed documented beta blocker date and time   History of Anesthesia Complications (+) PROLONGED EMERGENCE and history of anesthetic complications ("low O2 sat after surgery ")  Airway Mallampati: III  TM Distance: >3 FB Neck ROM: Full    Dental  (+) Edentulous Upper, Edentulous Lower   Pulmonary neg COPD, former smoker,    breath sounds clear to auscultation       Cardiovascular hypertension, Pt. on medications and Pt. on home beta blockers + CAD, + Past MI, + CABG and + DOE   Rhythm:Regular Rate:Bradycardia  EKG - SB with LVH   Neuro/Psych PSYCHIATRIC DISORDERS Anxiety Depression negative neurological ROS     GI/Hepatic Neg liver ROS, neg GERD  ,  Endo/Other  diabetesMorbid obesity  Renal/GU Renal InsufficiencyRenal disease     Musculoskeletal  (+) Arthritis ,   Abdominal (+) + obese,   Peds  Hematology   Anesthesia Other Findings Basal cell ca  Reproductive/Obstetrics                             Anesthesia Physical Anesthesia Plan  ASA: III  Anesthesia Plan: General   Post-op Pain Management:    Induction: Intravenous  PONV Risk Score and Plan: 3 and Ondansetron  Airway Management Planned: LMA and Oral ETT  Additional Equipment: None  Intra-op Plan:   Post-operative Plan: Extubation in OR  Informed Consent: I have reviewed the patients History and Physical, chart, labs and discussed the procedure including the risks, benefits and alternatives for the proposed anesthesia with the patient or authorized representative who has indicated his/her understanding and acceptance.   Dental advisory given  Plan Discussed with: CRNA and Surgeon  Anesthesia Plan Comments:          Anesthesia Quick Evaluation

## 2017-08-31 NOTE — Op Note (Signed)
08/31/2017  3:27 PM  PATIENT:  Julia West    PRE-OPERATIVE DIAGNOSIS:  Dehiscence Left Below Knee Amputation  POST-OPERATIVE DIAGNOSIS:  Same  PROCEDURE:  REVISION LEFT BELOW KNEE AMPUTATION application Praveena wound VAC.,  SURGEON:  Newt Minion, MD  PHYSICIAN ASSISTANT:None ANESTHESIA:   General  PREOPERATIVE INDICATIONS:  [name] is a  62 y.o. female with a diagnosis of Dehiscence Left Below Knee Amputation who failed conservative measures and elected for surgical management.    The risks benefits and alternatives were discussed with the patient preoperatively including but not limited to the risks of infection, bleeding, nerve injury, cardiopulmonary complications, the need for revision surgery, among others, and the patient was willing to proceed.  OPERATIVE IMPLANTS: Praveena wound VAC  @ENCIMAGES @  OPERATIVE FINDINGS: Necrotic tissue extended down to the transtibial amputation  OPERATIVE PROCEDURE: Patient was brought the operating room and underwent a general anesthetic.  After adequate levels of anesthesia were obtained patient's left lower extremity was prepped using DuraPrep draped into a sterile field a timeout was called.  A fishmouth incision was made around the ulcerative tissue this was carried sharply down to bone.  The distal centimeter of the tibia and fibula were resected.  Electrocautery was used for hemostasis.  All remaining tissue was viable there was a significant soft tissue envelope of adipose tissue minimal muscular tissue.  The wound was irrigated with normal saline hemostasis was confirmed the deep and superficial fascia layers and skin was closed using 2-0 nylon.  A Praveena wound VAC was applied this had a good suction fit patient was extubated taken to the PACU in stable condition.     DISCHARGE PLANNING:  Antibiotic duration:.  24 hours postoperatively  Weightbearing: Nonweightbearing on the left  Pain medication: Vicodin at  discharge  Dressing care/ Wound VAC: Continue wound VAC for 1 week at discharge  Ambulatory devices: Walker  Discharge to: Discharge to skilled nursing on 09/07/2017  Follow-up: In the office 1 week post operative.

## 2017-08-31 NOTE — H&P (Signed)
Julia West is an 62 y.o. female.   Chief Complaint: Dehiscence transtibial amputation. HPI: Patient is a 62 year old woman with wound dehiscence of the transtibial amputation.  She has a history of smoking her husband still smokes she has undergone previous revisions of the amputation with persistent progressive ischemic changes to the wound.  Past Medical History:  Diagnosis Date  . Acute myocardial infarction    with a ruptured plaque in the circumflex in 2003  . Anxiety   . Cancer ([place])    basal cell face  . Complication of anesthesia    states low O2 sats post-op 11/13, always slow to awaken  . Coronary artery disease   . Degenerative joint disease   . 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 Dyspnea    with activity- car to house - steps  . Heart murmur    "not to be concerned"  . HTN (hypertension)   . Hyperlipidemia LDL goal <70   . Internal and external hemorrhoids without complication   . Lymphedema    right side of body  . Lymphedema of arm    right  . Morbid obesity ([place])   . Surgical wound, non healing ABDOMINAL    has wound vac @ 125 mm Hg    Past Surgical History:  Procedure Laterality Date  . AMPUTATION Left 05/06/2017   Procedure: BELOW KNEE AMPUTATION;  Surgeon: Newt Minion, MD;  Location: [place];  Service: Orthopedics;  Laterality: Left;  . APPLICATION OF WOUND VAC    . BOWEL RESECTION  02/07/2012   Procedure: SMALL BOWEL RESECTION;  Surgeon: Julia Curry, MD,FACS;  Location: [place];  Service: General;;  . CARDIOVASCULAR STRESS TEST  01-17-2012  DR HOCHREIN   LOW RISK NUCLEAR TEST  . CESAREAN SECTION     x 4 in remote past  . CHOLECYSTECTOMY    . CORONARY ARTERY BYPASS GRAFT  2003   by Dr. Servando West. LIMA to the LAD, free RIMA to the circumflex. Stress perfusion study December 2009 with no high-risk areas of ischemia. She has a well-preserved ejection fraction  . HYSTEROSCOPY W/D&C N/A 05/14/2014   Procedure: DILATATION AND CURETTAGE  (no specimen); HYSTEROSCOPY;  Surgeon: Julia Kind, MD;  [facility];  Service: Gynecology;  Laterality: N/A;  . I&D EXTREMITY Left 06/21/2017   Procedure: IRRIGATION AND DEBRIDEMENT OF LEFT LEG AMPUTATION SITE;  Surgeon: Julia Going, DO;  [facility];  Service: Plastics;  Laterality: Left;  . INCISION AND DRAINAGE OF WOUND  04/17/2012   Procedure: IRRIGATION AND DEBRIDEMENT WOUND;  Surgeon: Julia Kos, DO;  Location: [place];  Service: Plastics;  Laterality: N/A;  OF ABDOMINAL WOUND, SURGICAL PREP AND PLACEMENT OF VAC, REMOVAL FOREHEAD SKIN LESION  . INCISION AND DRAINAGE OF WOUND N/A 04/24/2012   Procedure: IRRIGATION AND DEBRIDEMENT WOUND;  Surgeon: Julia Kos, DO;  Location: [place];  Service: Plastics;  Laterality: N/A;  I & D ABDOMINAL WOUND WITH VAC AND ACELL  . INCISION AND DRAINAGE OF WOUND N/A 05/01/2012   Procedure: IRRIGATION AND DEBRIDEMENT WOUND;  Surgeon: Julia Kos, DO;  Location: [place];  Service: Plastics;  Laterality: N/A;  WITH SURGICAL PREP AND PLACEMENT OF VAC  . INCISION AND DRAINAGE OF WOUND N/A 05/08/2012   Procedure: IRRIGATION AND DEBRIDEMENT OF ABD WOUND SURGICAL PREP AND PLACEMENT OF VAC ;  Surgeon: Julia Kos, DO;  Location: [place];  Service: Plastics;  Laterality: N/A;  IRRIGATION AND DEBRIDEMENT OF ABD WOUND SURGICAL PREP AND PLACEMENT OF VAC   . INCISION AND DRAINAGE OF WOUND N/A 05/15/2012   Procedure: IRRIGATION AND DEBRIDEMENT OF ABDOMINAL WOUND WITH POSSIBLE SURGICAL PREP AND PLACEMENT OF VAC;  Surgeon: Julia Kos, DO;  Location: [place];  Service: Plastics;  Laterality: N/A;  . INCISION AND DRAINAGE OF WOUND N/A 05/22/2012   Procedure: IRRIGATION AND DEBRIDEMENT OF ABODOMINAL WOUND WITH  SURGICAL PREP AND VAC PLACEMENT;  Surgeon: Julia Kos, DO;  Location: [place];  Service: Plastics;  Laterality: N/A;  . INCISION AND  DRAINAGE OF WOUND N/A 06/28/2012   Procedure: IRRIGATION AND DEBRIDEMENT OF ABDOMINAL ULCER SURGICAL PREP AND PLACEMENT OF ACELL AND VAC;  Surgeon: Julia Kos, DO;  Location: [place];  Service: Plastics;  Laterality: N/A;  . INCISIONAL HERNIA REPAIR  02/07/2012   Procedure: HERNIA REPAIR INCISIONAL;  Surgeon: Julia Curry, MD,FACS;  Location: [place];  Service: General;;  Open, Primary repair, strangulated Incisional hernia.  Julia West LESION REMOVAL  04/17/2012   Procedure: LESION REMOVAL;  Surgeon: Julia Kos, DO;  Location: [place];  Service: Plastics;  Laterality: N/A;   CENTER OF FOREHEAD, REMOVAL FORHEAD SKIN LESION  . ORIF TOE FRACTURE Left 02/09/2017   Procedure: EXCISION TALAR HEAD, INTERNAL FIXATION MEDIAL COLUMN LEFT FOOT;  Surgeon: Newt Minion, MD;  Location: [place];  Service: Orthopedics;  Laterality: Left;  . POLYPECTOMY N/A 05/14/2014   Procedure: ENDOMETRIAL POLYPECTOMY;  Surgeon: Julia Kind, MD;  [facility];  Service: Gynecology;  Laterality: N/A;    Family History  Problem Relation Age of Onset  . Heart attack Mother   . Hypertension Sister   . Diabetes Brother   . Birth defects Son        heart defect   Social History:  reports that she quit smoking about 5 years ago. Her smoking use included cigarettes. She has a 25.00 pack-year smoking history. She has never used smokeless tobacco. She reports that she does not drink alcohol or use drugs.  Allergies:  Allergies  Allergen Reactions  . Ace Inhibitors Cough    Medications Prior to Admission  Medication Sig Dispense Refill  . aspirin 81 MG tablet Take 81 mg by mouth daily.     Julia West doxycycline (VIBRA-TABS) 100 MG tablet Take 1 tablet (100 mg total) by mouth 2 (two) times daily. 60 tablet 0  . fenofibrate 160 MG tablet TAKE 1 TABLET BY MOUTH  DAILY FOR CHOLESTEROL AND  TRIGLYCERIDES 90 tablet 1  . fexofenadine (ALLEGRA) 180 MG tablet Take 180 mg by mouth daily.    Julia West gabapentin  (NEURONTIN) 100 MG capsule Take 100 mg by mouth at bedtime.    Julia West HYDROcodone-acetaminophen (NORCO) 7.5-325 MG tablet Take 1 tablet by mouth every 6 (six) hours as needed for moderate pain.    . Multiple Vitamins-Minerals (MULTIVITAMIN ADULT) TABS Take 1 tablet by mouth daily.    . nitroGLYCERIN (NITROSTAT) 0.4 MG SL tablet Place 1 tablet (0.4 mg total) under the tongue every 5 (five) minutes x 3 doses as needed for chest pain. If pain persist after call 911 25 tablet 6  . sertraline (ZOLOFT) 50 MG tablet Take 1 tablet (50 mg total) by mouth daily. 90 tablet 1  . traZODone (DESYREL) 150 MG tablet Take 150 mg by mouth at bedtime.    . valsartan-hydrochlorothiazide (DIOVAN-HCT) 160-12.5 MG tablet Take 1 tablet by mouth daily. 90 tablet  1    No results found for this or any previous visit (from the past 48 hour(s)). No results found.  Review of Systems  All other systems reviewed and are negative.   Blood pressure (!) 163/55, pulse (!) 54, temperature 97.8 F (36.6 C), temperature source Oral, resp. rate 18, height 5\' 6"  (1.676 m), weight 270 lb (122.5 kg). Physical Exam on examination patient is alert oriented no adenopathy well-dressed normal affect normal respiratory effort.  She has a wound is approximately 6 cm in diameter and 3 cm deep.  There is drainage maceration and granulation tissue at the base of the wound.  There is no ascending cellulitis no necrosis.  Assessment/Plan Assessment: Dehiscence left transtibial amputation.  Plan: We will plan for revision amputation with application of a wound VAC.  Risks and benefits were discussed including the need for higher level amputation.  Patient states she understands wished to proceed at this time patient and family wish for discharge to skilled nursing 400 days.  Newt Minion, MD 08/31/2017, 1:02 PM

## 2017-08-31 NOTE — Anesthesia Procedure Notes (Signed)
Procedure Name: Intubation Date/Time: 08/31/2017 2:48 PM Performed by: Teressa Lower., CRNA Pre-anesthesia Checklist: Patient identified, Emergency Drugs available, Suction available and Patient being monitored Patient Re-evaluated:Patient Re-evaluated prior to induction Oxygen Delivery Method: Circle system utilized Preoxygenation: Pre-oxygenation with 100% oxygen Induction Type: IV induction Ventilation: Mask ventilation without difficulty and Oral airway inserted - appropriate to patient size Laryngoscope Size: Mac and 3 Grade View: Grade I Tube type: Oral Tube size: 7.0 mm Number of attempts: 1 Airway Equipment and Method: Stylet and Oral airway Placement Confirmation: ETT inserted through vocal cords under direct vision,  positive ETCO2 and breath sounds checked- equal and bilateral Secured at: 21 cm Tube secured with: Tape Dental Injury: Teeth and Oropharynx as per pre-operative assessment

## 2017-08-31 NOTE — Transfer of Care (Signed)
Immediate Anesthesia Transfer of Care Note  Patient: Julia West  Procedure(s) Performed: REVISION LEFT BELOW KNEE AMPUTATION (Left )  Patient Location: PACU  Anesthesia Type:General  Level of Consciousness: awake, alert  and oriented  Airway & Oxygen Therapy: Patient Spontanous Breathing and Patient connected to face mask oxygen  Post-op Assessment: Report given to RN and Post -op Vital signs reviewed and stable  Post vital signs: Reviewed and stable  Last Vitals:  Vitals Value Taken Time  BP 139/82 08/31/2017  3:23 PM  Temp    Pulse 103 08/31/2017  3:25 PM  Resp 13 08/31/2017  3:25 PM  SpO2 100 % 08/31/2017  3:25 PM  Vitals shown include unvalidated device data.  Last Pain:  Vitals:   08/31/17 1100  TempSrc:   PainSc: 0-No pain      Patients Stated Pain Goal: 0 (29/19/16 6060)  Complications: No apparent anesthesia complications

## 2017-09-01 ENCOUNTER — Other Ambulatory Visit: Payer: Self-pay | Admitting: Family

## 2017-09-01 ENCOUNTER — Encounter (HOSPITAL_COMMUNITY): Payer: Self-pay | Admitting: Orthopedic Surgery

## 2017-09-01 NOTE — Anesthesia Postprocedure Evaluation (Signed)
Anesthesia Post Note  Patient: Julia West  Procedure(s) Performed: REVISION LEFT BELOW KNEE AMPUTATION (Left )     Patient location during evaluation: PACU Anesthesia Type: General Level of consciousness: awake and alert Pain management: pain level controlled Vital Signs Assessment: post-procedure vital signs reviewed and stable Respiratory status: spontaneous breathing, nonlabored ventilation, respiratory function stable and patient connected to nasal cannula oxygen Cardiovascular status: blood pressure returned to baseline and stable Postop Assessment: no apparent nausea or vomiting Anesthetic complications: no    Last Vitals:  Vitals:   09/01/17 0007 09/01/17 0358  BP: 119/66 (!) 119/58  Pulse: 99 66  Resp: 14 14  Temp: 36.6 C 36.5 C  SpO2: 99% 99%    Last Pain:  Vitals:   09/01/17 0739  TempSrc:   PainSc: 7                  Abigaile Rossie

## 2017-09-01 NOTE — [place] FL2 (Signed)
[place] MEDICAID FL2 LEVEL OF CARE SCREENING TOOL     IDENTIFICATION  Patient Name: Julia West Birthdate: 1955-07-07 Sex: female Admission Date (Current Location): 08/31/2017  [facility] and [place] Number:  Herbalist and Address:  [facility] [place], [place] [address] [place], [place] [address]      Provider Number: [id number]  Attending Physician Name and Address:  Newt Minion, MD  Relative Name and Phone Number:  Darl Pikes, daughter, [phone]    Current Level of Care: SNF Recommended Level of Care: [place] Prior Approval Number:    Date Approved/Denied:   PASRR Number: [id number]  Discharge Plan: SNF    Current Diagnoses: Patient Active Problem List   Diagnosis Date Noted  . Below knee amputation status, left ([place]) 08/31/2017  . Dehiscence of amputation stump ([place]) 08/29/2017  . Infection of amputation stump, left lower extremity ([place]) 06/19/2017  . Acquired absence of left leg below knee ([place]) 05/19/2017  . Bradycardia 05/07/2017  . Depression 05/07/2017  . Lymphedema 05/07/2017  . S/P BKA (below knee amputation) unilateral, left ([place]) 05/06/2017  . Gangrene of left foot ([place])   . Wound dehiscence, surgical, sequela   . Charcot foot due to diabetes mellitus ([place]) 02/09/2017  . 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 Post-menopausal bleeding 04/15/2014  . Endometrial polyp 04/09/2014  . Ventral hernia 03/06/2012  . Open abdominal wall wound 03/06/2012  . Hyperlipemia 11/12/2008  . OBESITY, MORBID 11/12/2008  . Essential hypertension 11/12/2008  . CAD (coronary artery disease) 11/12/2008    Orientation RESPIRATION BLADDER Height & Weight     Self, Time, Situation, Place  O2(Nasal Cannula 2L) Continent Weight: 270 lb (122.5 kg) Height:  5\' 6"  (167.6 cm)  BEHAVIORAL SYMPTOMS/MOOD NEUROLOGICAL BOWEL NUTRITION STATUS      Continent Diet(See DC Summary)  AMBULATORY  STATUS COMMUNICATION OF NEEDS Skin   Extensive Assist Verbally Surgical wounds, Skin abrasions                       Personal Care Assistance Level of Assistance  Dressing, Bathing, Feeding Bathing Assistance: Maximum assistance Feeding assistance: Limited assistance Dressing Assistance: Maximum assistance     Functional Limitations Info  Sight, Hearing, Speech Sight Info: Adequate Hearing Info: Adequate Speech Info: Adequate    SPECIAL CARE FACTORS FREQUENCY  PT (By licensed PT), OT (By licensed OT)     PT Frequency: 3x week OT Frequency: 2x week            Contractures      Additional Factors Info  Code Status, Allergies, Psychotropic Code Status Info: Full code Allergies Info: ACE INHIBITORS  Psychotropic Info: Zoloft and Trazadone         Current Medications (09/01/2017):  This is the current hospital active medication list Current Facility-Administered Medications  Medication Dose Route Frequency Provider Last Rate Last Dose  . 0.9 %  sodium chloride infusion   Intravenous Continuous Newt Minion, MD 10 mL/hr at 08/31/17 2019    . acetaminophen (TYLENOL) tablet 325-650 mg  325-650 mg Oral Q6H PRN Newt Minion, MD   650 mg at 09/01/17 [phone]  . aspirin EC tablet 81 mg  81 mg Oral Daily Newt Minion, MD   81 mg at 09/01/17 1010  . bisacodyl (DULCOLAX) suppository 10 mg  10 mg Rectal Daily PRN Newt Minion, MD      . docusate  sodium (COLACE) capsule 100 mg  100 mg Oral BID Newt Minion, MD   100 mg at 09/01/17 1008  . fenofibrate tablet 160 mg  160 mg Oral Daily Newt Minion, MD   160 mg at 09/01/17 1010  . gabapentin (NEURONTIN) capsule 100 mg  100 mg Oral QHS Newt Minion, MD   100 mg at 08/31/17 2012  . irbesartan (AVAPRO) tablet 150 mg  150 mg Oral Daily Newt Minion, MD   150 mg at 09/01/17 1010   And  . hydrochlorothiazide (MICROZIDE) capsule 12.5 mg  12.5 mg Oral Daily Newt Minion, MD   12.5 mg at 09/01/17 1010  . magnesium citrate  solution 1 Bottle  1 Bottle Oral Once PRN Newt Minion, MD      . methocarbamol (ROBAXIN) tablet 500 mg  500 mg Oral Q6H PRN Newt Minion, MD   500 mg at 09/01/17 1224   Or  . methocarbamol (ROBAXIN) 500 mg in dextrose 5 % 50 mL IVPB  500 mg Intravenous Q6H PRN Newt Minion, MD      . metoCLOPramide (REGLAN) tablet 5-10 mg  5-10 mg Oral Q8H PRN Newt Minion, MD       Or  . metoCLOPramide (REGLAN) injection 5-10 mg  5-10 mg Intravenous Q8H PRN Newt Minion, MD      . morphine 2 MG/ML injection 2 mg  2 mg Intravenous Q2H PRN Mcarthur Rossetti, MD   2 mg at 09/01/17 0837  . nitroGLYCERIN (NITROSTAT) SL tablet 0.4 mg  0.4 mg Sublingual Q5 Min x 3 PRN Newt Minion, MD      . ondansetron [facility]) tablet 4 mg  4 mg Oral Q6H PRN Newt Minion, MD       Or  . ondansetron [facility]) injection 4 mg  4 mg Intravenous Q6H PRN Newt Minion, MD      . oxyCODONE (Oxy IR/ROXICODONE) immediate release tablet 10-15 mg  10-15 mg Oral Q4H PRN Newt Minion, MD   15 mg at 08/31/17 2221  . oxyCODONE (Oxy IR/ROXICODONE) immediate release tablet 5-10 mg  5-10 mg Oral Q4H PRN Newt Minion, MD   10 mg at 09/01/17 1009  . polyethylene glycol (MIRALAX / GLYCOLAX) packet 17 g  17 g Oral Daily PRN Newt Minion, MD      . sertraline (ZOLOFT) tablet 50 mg  50 mg Oral Daily Newt Minion, MD   50 mg at 09/01/17 1010  . traZODone (DESYREL) tablet 150 mg  150 mg Oral QHS Newt Minion, MD   150 mg at 08/31/17 2013     Discharge Medications: Please see discharge summary for a list of discharge medications.  Relevant Imaging Results:  Relevant Lab Results:   Additional Information SS#: [id number]  Normajean Baxter, LCSW

## 2017-09-01 NOTE — Social Work (Signed)
CSW discussed insurance benefits with SNF-[facility]. CSW was advised that since patient was hospitalized for this admission, her 100 days for SNF will not start over. If she would have had 60 healthy days, then her SNF benefits would have started over on 6/26. SNF can take patient if she can self-pay for services.    CSW met with patient and friend at bedside and advise of same. Pt not pleased about this and CSW advised that she would try to find out if there are any other options. Pt indicated that she cannot afford to self pay. CSW also discussed with patient applying for medicaid, which takes time to be approved, but can be another way for patient to get more help at home in the future.  CSW then discussed with RNCM to see if IP Rehab wld be an option. RNCM spoke to IP rehab coordinator who indicated that they cannot take patient.    CSW then discussed with Asst. Direct. Social Work and he confirmed that patient would have to self-pay and we have no other options.  Pt unable to go to SNF as she would have to self-pay and cannot do so. Pt does not need to stay in hospital until 6/26 if no medical necessity as her medicare SNF days will not start back over on 6/26 and she wld still have to self pay for SNF placement.  CSW will defer to RNCM to assist with home health.  Julia Pencil, LCSW Clinical Social Worker [phone]     

## 2017-09-01 NOTE — Evaluation (Signed)
Physical Therapy Evaluation Patient Details Name: Julia West MRN: [id number] DOB: [birth year] Today's Date: 09/01/2017   History of Present Illness  Pt is a 63 y/o female admitted after dehiscence of the transtibial amputation, POD 1 revision left transtibial amputation. 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  PMH significant for but not limited to lymphedema and morbid obesity.     Clinical Impression  Pt presented supine in bed with HOB elevated, awake and willing to participate in therapy session. Prior to admission, pt reported that she uses a w/c for mobility and could perform lateral scoot/slide transfers without physical assistance. Pt currently requires min guard for bed mobility and mod A x2 for A-P transfers. Pt would continue to benefit from skilled physical therapy services at this time while admitted and after d/c to address the below listed limitations in order to improve overall safety and independence with functional mobility.     Follow Up Recommendations SNF    Equipment Recommendations  None recommended by PT    Recommendations for Other Services       Precautions / Restrictions Precautions Precautions: Fall Precaution Comments: wound VAC L residual limb Restrictions Weight Bearing Restrictions: No Other Position/Activity Restrictions: assumed NWB of L residual limb; no WB'ing orders      Mobility  Bed Mobility Overal bed mobility: Needs Assistance Bed Mobility: Supine to Sit     Supine to sit: Min guard Sit to supine: Min guard   General bed mobility comments: patient with min guard for safety  Transfers Overall transfer level: Needs assistance   Transfers: Comptroller transfers: Mod assist;+2 physical assistance   General transfer comment: A-P transfer performed with assistance using bed pads to scoot posteriorly into recliner chair from bed  Ambulation/Gait                 Stairs            Wheelchair Mobility    Modified Rankin (Stroke Patients Only)       Balance Overall balance assessment: Needs assistance Sitting-balance support: Single extremity supported Sitting balance-Leahy Scale: Fair Sitting balance - Comments: min guard for forward lean during self care tasks        Standing balance comment: NT                              Pertinent Vitals/Pain Pain Assessment: Faces Faces Pain Scale: Hurts little more Pain Location: L residual limb Pain Descriptors / Indicators: Discomfort;Guarding Pain Intervention(s): Monitored during session;Repositioned    Home Living Family/patient expects to be discharged to:: Skilled nursing facility                      Prior Function Level of Independence: Needs assistance   Gait / Transfers Assistance Needed: non ambulatory, using electric chair for mobility and completing side scoot transfers only   ADL's / Homemaking Assistance Needed: transfers with side scoot, completes bathing and dressing with SU assist from bed level and using DABSC         Hand Dominance        Extremity/Trunk Assessment   Upper Extremity Assessment Upper Extremity Assessment: Defer to OT evaluation    Lower Extremity Assessment Lower Extremity Assessment: LLE deficits/detail LLE Deficits / Details: wound VAC in place, pt with good AROM knee flexion and extension with functional weakness following  surgery       Communication      Cognition Arousal/Alertness: Awake/alert Behavior During Therapy: WFL for tasks assessed/performed Overall Cognitive Status: Within Functional Limits for tasks assessed                                        General Comments General comments (skin integrity, edema, etc.): PT emphasized the importance of maintaining full knee ROM for future prosthesis procurement    Exercises     Assessment/Plan    PT Assessment Patient needs  continued PT services  PT Problem List Decreased strength;Decreased mobility;Decreased coordination;Decreased range of motion;Decreased balance;Decreased activity tolerance;Decreased knowledge of use of DME;Decreased safety awareness;Decreased knowledge of precautions;Pain       PT Treatment Interventions DME instruction;Functional mobility training;Therapeutic activities;Therapeutic exercise;Balance training;Neuromuscular re-education;Patient/family education    PT Goals (Current goals can be found in the Care Plan section)  Acute Rehab PT Goals Patient Stated Goal: to go to rehab  PT Goal Formulation: With patient Time For Goal Achievement: 09/15/17 Potential to Achieve Goals: Fair    Frequency Min 2X/week   Barriers to discharge        Co-evaluation               AM-PAC PT "6 Clicks" Daily Activity  Outcome Measure Difficulty turning over in bed (including adjusting bedclothes, sheets and blankets)?: Unable Difficulty moving from lying on back to sitting on the side of the bed? : Unable Difficulty sitting down on and standing up from a chair with arms (e.g., wheelchair, bedside commode, etc,.)?: Unable Help needed moving to and from a bed to chair (including a wheelchair)?: A Lot Help needed walking in hospital room?: Total Help needed climbing 3-5 steps with a railing? : Total 6 Click Score: 7    End of Session   Activity Tolerance: Patient tolerated treatment well Patient left: in chair;with call bell/phone within reach;with family/visitor present Nurse Communication: Mobility status PT Visit Diagnosis: Other abnormalities of gait and mobility (R26.89)    Time: 1150-1210 PT Time Calculation (min) (ACUTE ONLY): 20 min   Charges:   PT Evaluation $PT Eval Moderate Complexity: 1 Mod     PT G Codes:        [place], PT, DPT [place] 09/01/2017, 1:57 PM

## 2017-09-01 NOTE — Progress Notes (Signed)
Patient ID: Julia West, female   DOB: [birth year], 62 y.o.   MRN: [id number] Postoperative day 1 revision left transtibial amputation.  There is 400 cc of clear serosanguineous drainage in the canister.  Patient has extreme weakness and deconditioning will need discharge to skilled nursing.  Patient and family anticipate discharge to skilled nursing on 09/07/2017.

## 2017-09-01 NOTE — Evaluation (Signed)
Occupational Therapy Evaluation Patient Details Name: Julia West MRN: [id number] DOB: [birth year] Today's Date: 09/01/2017    History of Present Illness Pt is a 62 y/o female admitted after dehiscence of the transtibial amputation, POD 1 revision left transtibial amputation. 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  PMH significant for but not limited to lymphedema and morbid obesity.    Clinical Impression   PTA patient was living at home with spouses support.  She required setup assistance for basin baths and dressing from bed level, used drop arm BSC for toileting needs with supervision, and completed mobility with electric chair.  She currently requires setup assistance for grooming and UB ADL, max assist for LB ADL, and anticipate +2 for safety for toilet transfers.  Patient will benefit from continued OT services in order to address problem list (see below) and maximize independence.  Recommend dc to SNF for further therapies, as patient reports her spouse is unable to provide physical assistance. Will continue to follow while admitted.     Follow Up Recommendations  SNF;Follow surgeon's recommendation for DC plan and follow-up therapies;Supervision/Assistance - 24 hour    Equipment Recommendations  Other (comment)(defer to next venue of care)    Recommendations for Other Services       Precautions / Restrictions Precautions Precautions: Fall Restrictions Weight Bearing Restrictions: Yes LLE Weight Bearing: Weight bearing as tolerated Other Position/Activity Restrictions: elevated L LE, wound vac to L LE       Mobility Bed Mobility Overal bed mobility: Needs Assistance Bed Mobility: Supine to Sit;Sit to Supine     Supine to sit: Min guard Sit to supine: Min guard   General bed mobility comments: patient with min guard for safety, good management of L BKA while adhering to NWB precautions   Transfers                 General transfer  comment: patient declines at time of eval     Balance Overall balance assessment: Needs assistance Sitting-balance support: Single extremity supported Sitting balance-Leahy Scale: Fair Sitting balance - Comments: min guard for forward lean during self care tasks        Standing balance comment: NT                            ADL either performed or assessed with clinical judgement   ADL Overall ADL's : Needs assistance/impaired Eating/Feeding: Set up   Grooming: Set up   Upper Body Bathing: Supervision/ safety   Lower Body Bathing: Moderate assistance Lower Body Bathing Details (indicate cue type and reason): decreased reach to R LE, limited mobility due to discomfort in L BKA  Upper Body Dressing : Set up   Lower Body Dressing: Maximal assistance;With adaptive equipment;Bed level Lower Body Dressing Details (indicate cue type and reason): anticipate increased assistance with LB dressing due to decreased reach, discomfort in L BKA and body habitus  Toilet Transfer: Requires wide/bariatric;Requires drop arm;BSC;+2 for safety/equipment;Moderate assistance Toilet Transfer Details (indicate cue type and reason): pt scooting on EOB with min assist, anticipate mod assist for side scoot transfers +2 for safety- will need DABSC            General ADL Comments: Patient highly motivated to maximize independence.  Declines to transfers OOB at this time.       Vision   Vision Assessment?: No apparent visual deficits     Perception  Praxis      Pertinent Vitals/Pain Pain Assessment: 0-10 Pain Score: 5  Pain Descriptors / Indicators: Discomfort;Guarding Pain Intervention(s): Limited activity within patient's tolerance;Repositioned;Monitored during session;Premedicated before session     Hand Dominance Right   Extremity/Trunk Assessment Upper Extremity Assessment Upper Extremity Assessment: Generalized weakness   Lower Extremity Assessment Lower Extremity  Assessment: LLE deficits/detail LLE Deficits / Details: s/p revision BKA       Communication Communication Communication: No difficulties   Cognition Arousal/Alertness: Awake/alert Behavior During Therapy: WFL for tasks assessed/performed Overall Cognitive Status: Within Functional Limits for tasks assessed                                     General Comments       Exercises     Shoulder Instructions      Home Living Family/patient expects to be discharged to:: Private residence Living Arrangements: Spouse/significant other Available Help at Discharge: Family Type of Home: House Home Access: Ramped entrance     Home Layout: One level     Bathroom Shower/Tub: Tub only   Biochemist, clinical: Standard     Home Equipment: Bedside commode;Electric scooter;Adaptive equipment(DABSC) Adaptive Equipment: Sock aid;Reacher Additional Comments: pt reports not accessing bathroom- using DABSC and completing basin bath from bed level       Prior Functioning/Environment Level of Independence: Needs assistance  Gait / Transfers Assistance Needed: non ambulatory, using electric chair for mobility and completing side scoot transfers only  ADL's / Homemaking Assistance Needed: transfers with side scoot, completes bathing and dressing with SU assist from bed level and using DABSC             OT Problem List: Decreased strength;Impaired balance (sitting and/or standing);Pain;Decreased range of motion;Obesity;Increased edema;Decreased activity tolerance;Decreased knowledge of use of DME or AE      OT Treatment/Interventions: Self-care/ADL training;DME and/or AE instruction;Therapeutic activities;Balance training;Energy conservation;Patient/family education    OT Goals(Current goals can be found in the care plan section) Acute Rehab OT Goals Patient Stated Goal: to go to rehab  OT Goal Formulation: With patient Time For Goal Achievement: 09/15/17 Potential to Achieve  Goals: Good  OT Frequency: Min 2X/week   Barriers to D/C:            Co-evaluation              AM-PAC PT "6 Clicks" Daily Activity     Outcome Measure Help from another person eating meals?: None Help from another person taking care of personal grooming?: A Little Help from another person toileting, which includes using toliet, bedpan, or urinal?: A Lot Help from another person bathing (including washing, rinsing, drying)?: A Lot Help from another person to put on and taking off regular upper body clothing?: None Help from another person to put on and taking off regular lower body clothing?: A Lot 6 Click Score: 17   End of Session Equipment Utilized During Treatment: Gait belt;Oxygen(2L oxygen, wound vac) Nurse Communication: Mobility status;Other (comment)(pure wick needs)  Activity Tolerance: Patient tolerated treatment well;No increased pain Patient left: in bed;with call bell/phone within reach;with bed alarm set  OT Visit Diagnosis: Muscle weakness (generalized) (M62.81)                Time: 2878-6767 OT Time Calculation (min): 25 min Charges:  OT General Charges $OT Visit: 1 Visit OT Evaluation $OT Eval Moderate Complexity: 1 Mod OT Treatments $Self Care/Home Management :  8-22 mins G-Codes:     Delight Stare, OTR/L  Pager [place] 09/01/2017, 10:07 AM

## 2017-09-01 NOTE — Clinical Social Work Note (Signed)
Clinical Social Work Assessment  Patient Details  Name: Julia West MRN: [id number] Date of Birth: 04/01/1955  Date of referral:  09/01/17               Reason for consult:  Facility Placement                Permission sought to share information with:  Facility Art therapist granted to share information::  Yes, Verbal Permission Granted  Name::        Agency::  SNF  Relationship::     Contact Information:     Housing/Transportation Living arrangements for the past 2 months:  Single Family Home Source of Information:  Patient Patient Interpreter Needed:  None Criminal Activity/Legal Involvement Pertinent to Current Situation/Hospitalization:  No - Comment as needed Significant Relationships:  Spouse, Friend, Other Family Members Lives with:  Spouse Do you feel safe going back to the place where you live?  No Need for family participation in patient care:  No (Coment)  Care giving concerns:  Pt from home with spouse and will need rehab.  Social Worker assessment / plan:  CSW met with patient and friend at bedside.  Pt has been has had multiple admissions for knee and has been to SNF a few times since November 18. Pt indicated that she would like to go to [facility]. Pt indicated that her medicare 100 days for skilled starts back over in 6/26 and she would be at hospital until that time. CSW advised patient that she cannot stay at hospital until 6/26 if there is no medical necessity. Pt disputed and indicated that doctor agreed. CSW will f/u. CSW obtained permission to send out referrals and will f/u on SNF days via the SNF's.  CSW explained SNF process.   CSW f/u for disposition.  Employment status:  Unemployed Nurse, adult PT Recommendations:  [place] / Referral to community resources:  [place]  Patient/Family's Response to care:  Pt appreciative, but not pleased with having  multiple hospitalizations and SNF placement.  Patient/Family's Understanding of and Emotional Response to Diagnosis, Current Treatment, and Prognosis:  Pt has good understanding of impairment and understands that she needs additional support. Pt has been dealing with impairment for a few months now and has been to SNF a few times. She is concerned about her insurance benefits covering her SNF placement. CSW assured her that CSW will f/u as SNF will reply when referral is sent and provide more information. Pt desires [facility].   Emotional Assessment Appearance:  Appears stated age Attitude/Demeanor/Rapport:  (Cooperative) Affect (typically observed):  Accepting, Appropriate Orientation:  Oriented to Situation, Oriented to Self, Oriented to Place, Oriented to  Time Alcohol / Substance use:  Not Applicable Psych involvement (Current and /or in the community):  No (Comment)  Discharge Needs  Concerns to be addressed:  Discharge Planning Concerns Readmission within the last 30 days:  No Current discharge risk:  Dependent with Mobility, Physical Impairment Barriers to Discharge:  No Barriers Identified   Normajean Baxter, LCSW 09/01/2017, 1:58 PM

## 2017-09-02 MED ORDER — OXYCODONE-ACETAMINOPHEN 5-325 MG PO TABS: 1.0000 | ORAL_TABLET | ORAL | 0 refills | Status: DC | PRN

## 2017-09-02 NOTE — Discharge Summary (Signed)
Discharge Diagnoses:  Active Problems:   Infection of amputation stump, left lower extremity ([place])   Below knee amputation status, left ([place])   Surgeries: Procedure(s): REVISION LEFT BELOW KNEE AMPUTATION on 08/31/2017    Consultants:   Discharged Condition: Improved  Hospital Course: Julia West is an 62 y.o. female who was admitted 08/31/2017 with a chief complaint of dehiscence left transtibial amputation, with a final diagnosis of Dehiscence Left Below Knee Amputation.  Patient was brought to the operating room on 08/31/2017 and underwent Procedure(s): REVISION LEFT BELOW KNEE AMPUTATION.    Patient was given perioperative antibiotics:  Anti-infectives (From admission, onward)   Start     Dose/Rate Route Frequency Ordered Stop   08/31/17 2045  ceFAZolin (ANCEF) IVPB 2g/100 mL premix     2 g 200 mL/hr over 30 Minutes Intravenous Every 6 hours 08/31/17 1655 09/01/17 0859   08/31/17 1130  ceFAZolin (ANCEF) 3 g in dextrose 5 % 50 mL IVPB     3 g 100 mL/hr over 30 Minutes Intravenous On call to O.R. 08/30/17 1044 08/31/17 1442    .  Patient was given sequential compression devices, early ambulation, and aspirin for DVT prophylaxis.  Recent vital signs:  Patient Vitals for the past 24 hrs:  BP Temp Temp src Pulse Resp SpO2  09/02/17 0318 (!) 116/31 98.2 F (36.8 C) Oral 62 16 99 %  09/02/17 0115 (!) 129/56 97.9 F (36.6 C) Oral 61 16 97 %  09/01/17 1641 (!) 96/48 98.1 F (36.7 C) Oral 62 - 98 %  .  Recent laboratory studies: No results found.  Discharge Medications:   Allergies as of 09/02/2017      Reactions   Ace Inhibitors Cough      Medication List    STOP taking these medications   HYDROcodone-acetaminophen 7.5-325 MG tablet Commonly known as:  NORCO     TAKE these medications   aspirin 81 MG tablet Take 81 mg by mouth daily.   doxycycline 100 MG tablet Commonly known as:  VIBRA-TABS Take 1 tablet (100 mg total) by mouth 2 (two) times daily.    fenofibrate 160 MG tablet TAKE 1 TABLET BY MOUTH  DAILY FOR CHOLESTEROL AND  TRIGLYCERIDES   fexofenadine 180 MG tablet Commonly known as:  ALLEGRA Take 180 mg by mouth daily.   gabapentin 100 MG capsule Commonly known as:  NEURONTIN Take 100 mg by mouth at bedtime.   hydrochlorothiazide 12.5 MG capsule Commonly known as:  MICROZIDE TAKE 1 CAPSULE BY MOUTH EVERY DAY   losartan 100 MG tablet Commonly known as:  COZAAR TAKE 1 TABLET BY MOUTH EVERY DAY   MULTIVITAMIN ADULT Tabs Take 1 tablet by mouth daily.   nitroGLYCERIN 0.4 MG SL tablet Commonly known as:  NITROSTAT Place 1 tablet (0.4 mg total) under the tongue every 5 (five) minutes x 3 doses as needed for chest pain. If pain persist after call 911   oxyCODONE-acetaminophen 5-325 MG tablet Commonly known as:  PERCOCET/ROXICET Take 1 tablet by mouth every 4 (four) hours as needed.   sertraline 50 MG tablet Commonly known as:  ZOLOFT Take 1 tablet (50 mg total) by mouth daily.   traZODone 150 MG tablet Commonly known as:  DESYREL Take 150 mg by mouth at bedtime.   valsartan-hydrochlorothiazide 160-12.5 MG tablet Commonly known as:  DIOVAN-HCT Take 1 tablet by mouth daily.       Diagnostic Studies: No results found.  Patient benefited maximally from their hospital stay and there  were no complications.     Disposition: Discharge disposition: 01-Home or Self Care      Discharge Instructions    Call MD / Call 911   Complete by:  As directed    If you experience chest pain or shortness of breath, CALL 911 and be transported to the hospital emergency room.  If you develope a fever above 101 F, pus (white drainage) or increased drainage or redness at the wound, or calf pain, call your surgeon's office.   Constipation Prevention   Complete by:  As directed    Drink plenty of fluids.  Prune juice may be helpful.  You may use a stool softener, such as Colace (over the counter) 100 mg twice a day.  Use MiraLax  (over the counter) for constipation as needed.   Diet - low sodium heart healthy   Complete by:  As directed    Increase activity slowly as tolerated   Complete by:  As directed    Negative Pressure Wound Therapy - Incisional   Complete by:  As directed    Obtained Praveena plus wound VAC from the operating room for discharge.  This should remain in place for 1 week.     Follow-up Information    Newt Minion, MD In 1 week.   Specialty:  Orthopedic Surgery Contact information: [place] [address] [phone]            Signed: Newt Minion 09/02/2017, 9:35 AM

## 2017-09-02 NOTE — Progress Notes (Signed)
Patient ID: Julia West, female   DOB: 09/09/1955, 62 y.o.   MRN: [id number] Plan for discharge to home Monday with HHPT, aide, OT. Face-to-face completed.

## 2017-09-02 NOTE — Plan of Care (Signed)
  Problem: Nutrition: Goal: Adequate nutrition will be maintained Outcome: Progressing   Problem: Coping: Goal: Level of anxiety will decrease Outcome: Progressing   Problem: Elimination: Goal: Will not experience complications related to bowel motility Outcome: Progressing   Problem: Pain Managment: Goal: General experience of comfort will improve Outcome: Progressing   Problem: Safety: Goal: Ability to remain free from injury will improve Outcome: Progressing   

## 2017-09-02 NOTE — Care Management Note (Addendum)
Case Management Note  Patient Details  Name: Julia West MRN: [id number] Date of Birth: [birth year]  Subjective/Objective:  62 yr old female s/p revision of left BKA on 08/31/17.   Action/Plan:Case manager spoke with patient concerning discharge plan. Patient initially planned to go to SNF. She is out of medicare days at this time and wil need to discharge home. Patient will receive therapy here for a few more days and discharge home. Case manager offered choice for [facility], she has used [facility] in the past and chooses to do so now. She has DME.                     Expected Discharge Date:  09/05/17            Expected Discharge Plan:  [place]  In-House Referral:  NA  Discharge planning Services  CM Consult  Post Acute Care Choice:  Durable Medical Equipment, Home Health Choice offered to:  Patient  DME Arranged:   NA DME Agency:     HH Arranged:   PT/OT/Aide HH Agency:    [facility]  Status of Service:   completed Home with Home Health  If discussed at [place] of Stay Meetings, dates discussed:    Additional Comments:  Ninfa Meeker, RN 09/02/2017, 12:54 PM

## 2017-09-02 NOTE — Discharge Instructions (Signed)
Patient will need skilled nursing facility admission on 09/07/2017.

## 2017-09-02 NOTE — Progress Notes (Signed)
Patient ID: Julia West, female   DOB: 1955/03/27, 62 y.o.   MRN: [id number] Patient without complaints this morning.  She states that she can get another 100 days of skilled nursing care through her insurance starting 6/26.  Will plan to discharge when a bed is available possibly today.  Patient will need the Praveena pump attached to the wound VAC this can be obtained from the operating room, prescription written for Percocet.  Examination patient has no increased drainage from yesterday she has had 400 cc in the canister for 24 hours with no drainage over the last 24 hours.  I feel the Praveena plus canister should be sufficient for drainage.  Plan to follow-up in the office next week.

## 2017-09-02 NOTE — Social Work (Signed)
Social work aware pt is discharged, pt out of coverage days, would have to pay for SNF stay which pt is unable to do. Therefore, pt will discharge home. CSW signing off. Please consult if any additional needs arise.  [place], [place] Work [phone]

## 2017-09-03 NOTE — Progress Notes (Signed)
Paged MD regarding pt's low diastolic BP. Pt is asymptomatic. Waiting on RTC from MD. Will continue to monitor pt.

## 2017-09-03 NOTE — Progress Notes (Addendum)
Spoke with on call MD and he stated to continue to monitor pt. Nothing to do at this point. Will continue to monitor pt.

## 2017-09-03 NOTE — Progress Notes (Signed)
Subjective: 3 Days Post-Op Procedure(s) (LRB): REVISION LEFT BELOW KNEE AMPUTATION (Left) Patient reports pain as mild.  No complaints.   Objective: Vital signs in last 24 hours: Temp:  [98.3 F (36.8 C)-98.9 F (37.2 C)] 98.6 F (37 C) (06/22 0427) Pulse Rate:  [67-79] 69 (06/22 0427) Resp:  [17-18] 17 (06/22 0427) BP: (119-154)/(41-61) 131/49 (06/22 0427) SpO2:  [94 %-95 %] 94 % (06/22 0427)  Intake/Output from previous day: 06/21 0701 - 06/22 0700 In: 240 [P.O.:240] Out: 2950 [Urine:2950] Intake/Output this shift: No intake/output data recorded.  No results for input(s): HGB in the last 72 hours. No results for input(s): WBC, RBC, HCT, PLT in the last 72 hours. No results for input(s): NA, K, CL, CO2, BUN, CREATININE, GLUCOSE, CALCIUM in the last 72 hours. No results for input(s): LABPT, INR in the last 72 hours.  Compartment soft  Wound vac in place 450 in canister.     Assessment/Plan: 3 Days Post-Op Procedure(s) (LRB): REVISION LEFT BELOW KNEE AMPUTATION (Left) Discharge to [facility]  09/07/17.     Julia West 09/03/2017, 8:20 AM

## 2017-09-04 MED ORDER — BISACODYL 10 MG RE SUPP: 10.0000 mg | Freq: Once | RECTAL | Status: DC

## 2017-09-04 NOTE — Progress Notes (Signed)
Subjective: 4 Days Post-Op Procedure(s) (LRB): REVISION LEFT BELOW KNEE AMPUTATION (Left) Patient reports pain as mild.  Constipation only issue this AM.   Objective: Vital signs in last 24 hours: Temp:  [98.4 F (36.9 C)-99 F (37.2 C)] 98.4 F (36.9 C) (06/23 0518) Pulse Rate:  [64-68] 66 (06/23 0518) Resp:  [14-15] 14 (06/23 0518) BP: (110-140)/(40-55) 125/52 (06/23 0518) SpO2:  [96 %-97 %] 97 % (06/23 0518)  Intake/Output from previous day: 06/22 0701 - 06/23 0700 In: 719.7 [P.O.:240; I.V.:479.7] Out: -  Intake/Output this shift: No intake/output data recorded.  No results for input(s): HGB in the last 72 hours. No results for input(s): WBC, RBC, HCT, PLT in the last 72 hours. No results for input(s): NA, K, CL, CO2, BUN, CREATININE, GLUCOSE, CALCIUM in the last 72 hours. No results for input(s): LABPT, INR in the last 72 hours.  Left lower extremity: Compartment soft  Wound VAC in place     Assessment/Plan: 4 Days Post-Op Procedure(s) (LRB): REVISION LEFT BELOW KNEE AMPUTATION (Left) Suppository for constipation  Continue current care     Julia West 09/04/2017, 8:39 AM

## 2017-09-05 ENCOUNTER — Other Ambulatory Visit: Payer: Self-pay | Admitting: Internal Medicine

## 2017-09-05 DIAGNOSIS — Z888 Allergy status to other drugs, medicaments and biological substances status: Secondary | ICD-10-CM

## 2017-09-05 DIAGNOSIS — Z87891 Personal history of nicotine dependence: Secondary | ICD-10-CM

## 2017-09-05 DIAGNOSIS — E785 Hyperlipidemia, unspecified: Secondary | ICD-10-CM

## 2017-09-05 DIAGNOSIS — D72829 Elevated white blood cell count, unspecified: Secondary | ICD-10-CM

## 2017-09-05 DIAGNOSIS — Z89512 Acquired absence of left leg below knee: Secondary | ICD-10-CM

## 2017-09-05 DIAGNOSIS — R6883 Chills (without fever): Secondary | ICD-10-CM

## 2017-09-05 DIAGNOSIS — I10 Essential (primary) hypertension: Secondary | ICD-10-CM

## 2017-09-05 DIAGNOSIS — Z872 Personal history of diseases of the skin and subcutaneous tissue: Secondary | ICD-10-CM

## 2017-09-05 DIAGNOSIS — I2510 Atherosclerotic heart disease of native coronary artery without angina pectoris: Secondary | ICD-10-CM

## 2017-09-05 LAB — CBC WITH DIFFERENTIAL/PLATELET
ABS IMMATURE GRANULOCYTES: 0.1 10*3/uL (ref 0.0–0.1)
Basophils Absolute: 0.1 10*3/uL (ref 0.0–0.1)
Basophils Relative: 0 %
Eosinophils Absolute: 0.6 10*3/uL (ref 0.0–0.7)
Eosinophils Relative: 5 %
HCT: 25.6 % — ABNORMAL LOW (ref 36.0–46.0)
HEMOGLOBIN: 7.8 g/dL — AB (ref 12.0–15.0)
IMMATURE GRANULOCYTES: 1 %
LYMPHS ABS: 3.6 10*3/uL (ref 0.7–4.0)
LYMPHS PCT: 26 %
MCH: 26.2 pg (ref 26.0–34.0)
MCHC: 30.5 g/dL (ref 30.0–36.0)
MCV: 85.9 fL (ref 78.0–100.0)
Monocytes Absolute: 1.1 10*3/uL — ABNORMAL HIGH (ref 0.1–1.0)
Monocytes Relative: 8 %
NEUTROS ABS: 8.4 10*3/uL — AB (ref 1.7–7.7)
NEUTROS PCT: 60 %
Platelets: 336 10*3/uL (ref 150–400)
RBC: 2.98 MIL/uL — AB (ref 3.87–5.11)
RDW: 15.9 % — ABNORMAL HIGH (ref 11.5–15.5)
WBC: 13.9 10*3/uL — AB (ref 4.0–10.5)

## 2017-09-05 LAB — BASIC METABOLIC PANEL
ANION GAP: 8 (ref 5–15)
BUN: 18 mg/dL (ref 6–20)
CHLORIDE: 98 mmol/L — AB (ref 101–111)
CO2: 32 mmol/L (ref 22–32)
Calcium: 8.6 mg/dL — ABNORMAL LOW (ref 8.9–10.3)
Creatinine, Ser: 0.99 mg/dL (ref 0.44–1.00)
GFR calc Af Amer: >60 mL/min (ref >60)
GFR calc non Af Amer: 59 mL/min — ABNORMAL LOW (ref >60)
GLUCOSE: 135 mg/dL — AB (ref 65–99)
POTASSIUM: 4.5 mmol/L (ref 3.5–5.1)
Sodium: 138 mmol/L (ref 135–145)

## 2017-09-05 LAB — C-REACTIVE PROTEIN: CRP: 7.9 mg/dL — ABNORMAL HIGH (ref <1.0)

## 2017-09-05 LAB — SEDIMENTATION RATE: Sed Rate: 84 mm/hr — ABNORMAL HIGH (ref 0–22)

## 2017-09-05 MED ORDER — AMOXICILLIN-POT CLAVULANATE 875-125 MG PO TABS
1.0000 | ORAL_TABLET | Freq: Two times a day (BID) | ORAL | Status: DC
  Administered 2017-09-05 – 2017-09-06 (×2): 1 via ORAL
  Filled 2017-09-05 (×2): qty 1

## 2017-09-05 MED ORDER — AMOXICILLIN-POT CLAVULANATE 875-125 MG PO TABS: 1.0000 | ORAL_TABLET | Freq: Two times a day (BID) | ORAL | 0 refills | Status: DC

## 2017-09-05 NOTE — Progress Notes (Signed)
Physical Therapy Treatment Patient Details Name: Julia West MRN: [id number] DOB: 1955/07/19 Today's Date: 09/05/2017    History of Present Illness Pt is a 62 y/o female admitted after dehiscence of the transtibial amputation, POD 1 revision left transtibial amputation. 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  PMH significant for but not limited to lymphedema and morbid obesity.     PT Comments    Pt motivated to get OOB pre tx, performed supine exercises for warm-up.  Post supine ex, RN entered room and reports patient is to d/c.  Pt agitated and refused further mobility.     Follow Up Recommendations  SNF     Equipment Recommendations  None recommended by PT    Recommendations for Other Services       Precautions / Restrictions Precautions Precautions: Fall Precaution Comments: wound VAC L residual limb Restrictions Weight Bearing Restrictions: Yes LLE Weight Bearing: Non weight bearing Other Position/Activity Restrictions: assumed NWB of L residual limb; no WB'ing orders    Mobility  Bed Mobility               General bed mobility comments: Refused OOB after finding out she was to d/c post supine exercises.    Transfers                    Ambulation/Gait                 Stairs             Wheelchair Mobility    Modified Rankin (Stroke Patients Only)       Balance                                            Cognition Arousal/Alertness: Awake/alert Behavior During Therapy: WFL for tasks assessed/performed Overall Cognitive Status: Within Functional Limits for tasks assessed                                        Exercises General Exercises - Lower Extremity Ankle Circles/Pumps: AROM;Right;20 reps;Supine Quad Sets: AROM;Both;Supine;15 reps Hip ABduction/ADduction: AROM;Both;15 reps;Supine Straight Leg Raises: AROM;Both;15 reps;Supine Shoulder  Exercises Shoulder Flexion: AROM;Both;15 reps;Supine    General Comments        Pertinent Vitals/Pain Pain Assessment: Faces Pain Score: 5  Pain Location: L residual limb Pain Descriptors / Indicators: Discomfort;Guarding Pain Intervention(s): Monitored during session;Repositioned    Home Living                      Prior Function            PT Goals (current goals can now be found in the care plan section) Acute Rehab PT Goals Patient Stated Goal: to go to rehab  Potential to Achieve Goals: Fair Progress towards PT goals: Progressing toward goals    Frequency    Min 2X/week      PT Plan Current plan remains appropriate    Co-evaluation              AM-PAC PT "6 Clicks" Daily Activity  Outcome Measure  Difficulty turning over in bed (including adjusting bedclothes, sheets and blankets)?: Unable Difficulty moving from lying on back to sitting on the side of the bed? :  Unable Difficulty sitting down on and standing up from a chair with arms (e.g., wheelchair, bedside commode, etc,.)?: Unable Help needed moving to and from a bed to chair (including a wheelchair)?: A Lot Help needed walking in hospital room?: Total Help needed climbing 3-5 steps with a railing? : Total 6 Click Score: 7    End of Session   Activity Tolerance: Patient tolerated treatment well Patient left: with call bell/phone within reach;in bed;with nursing/sitter in room Nurse Communication: Mobility status PT Visit Diagnosis: Other abnormalities of gait and mobility (R26.89)     Time: 1650-1705 PT Time Calculation (min) (ACUTE ONLY): 15 min  Charges:  $Therapeutic Exercise: 8-22 mins                    G Codes:       Governor Rooks, PTA pager [phone]    Cristela Blue 09/05/2017, 5:14 PM

## 2017-09-05 NOTE — Progress Notes (Addendum)
     Subjective: 5 Days Post-Op Procedure(s) (LRB): REVISION LEFT BELOW KNEE AMPUTATION (Left) Awake, alert and oriented x 4. Dr. Sharol Given said I was to go to a SNF.  Patient reports pain as moderate.    Objective:   VITALS:  Temp:  [98.6 F (37 C)-99.6 F (37.6 C)] 99.6 F (37.6 C) (06/24 1455) Pulse Rate:  [63-65] 65 (06/24 1455) Resp:  [14] 14 (06/24 0406) BP: (108-132)/(52-72) 108/72 (06/24 1455) SpO2:  [94 %-99 %] 94 % (06/24 1455)  Incision: dressing C/D/I and moderate drainage No cellulitis present Compartment soft VAC left distal BKA residual limb intact and drawing negative pressure. Lying in bed and is not able to move herself. VAC to suction container.    LABS Recent Labs    09/05/17 1350  HGB 7.8*  WBC 13.9*  PLT 336   Recent Labs    09/05/17 1350  NA 138  K 4.5  CL 98*  CO2 32  BUN 18  CREATININE 0.99  GLUCOSE 135*   No results for input(s): LABPT, INR in the last 72 hours.   Assessment/Plan: 5 Days Post-Op Procedure(s) (LRB): REVISION LEFT BELOW KNEE AMPUTATION (Left)  Advance diet Up with therapy Continue ABX therapy due to Post-op infection  Start SNF placement 09/07/2017 as per Dr.Duda's note for 08/31/2017 and OR note from 09/02/2017. She will be able to consider SNF placement on 6/26 so discharge is not able to be doe till then.  OT/PT to assess appropriateness of discharge to home vs SNF.  Basil Dess 09/05/2017, 5:37 PMPatient ID: Julia West, female   DOB: 1955/11/27, 62 y.o.   MRN: [id number]

## 2017-09-05 NOTE — Progress Notes (Signed)
Subjective: 5 Days Post-Op Procedure(s) (LRB): REVISION LEFT BELOW KNEE AMPUTATION (Left) Patient reports pain as mild.  Slight nausea no vomiting.  Constipation has resolved.    Objective: Vital signs in last 24 hours: Temp:  [98.6 F (37 C)-99.3 F (37.4 C)] 98.8 F (37.1 C) (06/24 0847) Pulse Rate:  [63-65] 63 (06/24 0406) Resp:  [14] 14 (06/24 0406) BP: (130-132)/(52-53) 132/52 (06/24 0406) SpO2:  [96 %-99 %] 99 % (06/24 0406)  Intake/Output from previous day: 06/23 0701 - 06/24 0700 In: -  Out: 1902 [Urine:800; Stool:1102] Intake/Output this shift: No intake/output data recorded.  No results for input(s): HGB in the last 72 hours. No results for input(s): WBC, RBC, HCT, PLT in the last 72 hours. No results for input(s): NA, K, CL, CO2, BUN, CREATININE, GLUCOSE, CALCIUM in the last 72 hours. No results for input(s): LABPT, INR in the last 72 hours.  Neurologically intact Neurovascular intact Sensation intact distally Intact pulses distally Dorsiflexion/Plantar flexion intact Incision: dressing C/D/I No cellulitis present Compartment soft  400 cc blood in wound vac canister this am    Assessment/Plan: 5 Days Post-Op Procedure(s) (LRB): REVISION LEFT BELOW KNEE AMPUTATION (Left) Advance diet Up with therapy Discharge home with home health (I am not concerned for infection) NWB LLE      Aundra Dubin 09/05/2017, 9:42 AM

## 2017-09-05 NOTE — Consult Note (Signed)
   [facility] CM Inpatient Consult   09/05/2017  ANALILIA GEDDIS [birth year] [phone]  Patient screened for potential [place] Management services. Patient is in the [facility] of the [place] Management services under patient's [employer] plan.   Spoke with patient regarding community followup needs.  Patient states she will be going home for a couple of days before returning to [facility] for rehab.  She states she does not have any issues currently except, "I am out of my skilled days until after Wednesday all I want is in writing that my 100 days officially start again on 09/08/17 because I don't want to end up with a $4000.00 bill.  I just want to make sure I  get the correct information about my SNF days."   Patient would like [place] worker to follow up when she returns to [employer].  Spoke with inpatient care management team.   For questions contact:   Natividad Brood, RN BSN [facility] Liaison  [phone] business mobile phone Toll free office [phone]

## 2017-09-05 NOTE — Consult Note (Signed)
[place] for Infectious Disease  Total days of antibiotics 2/cefazolin       Reason for Consult: chills/hx of wound dehiscence related to left BKA    Referring Physician: duda  Active Problems:   Infection of amputation stump, left lower extremity ([place])   Below knee amputation status, left (HCC)    HPI: Julia West is a 62 y.o. 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 She underwent transtibial amputation on 10/07/34 that was complicated by epidermolysis at incision, cellulitis over periowound/distal stump. On 06/21/17, she underwent evacuation of hematoma and Acell application by Dr Marla Roe. Over the next few weeks, still had wound drainage and poor healing. Including having wound cx showing amp S- enterococcus. She was admitted on 08/31/17 for revision of of L BKA to address macerated tissue and drainage- no admit labs noted in the system. OR report did not suggest that she had deep tissue infection, thus no tissue cultures sent at the time. She has been afebrile, though the patient reports having chills. Her WBC on on 6/24 is 13.9. She received pre-op abtx with cefazolin at the time of admit on 6/19.   Past Medical History:  Diagnosis Date  . Acute myocardial infarction    with a ruptured plaque in the circumflex in 2003  . Anxiety   . 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 Complication of anesthesia    states low O2 sats post-op 11/13, always slow to awaken  . Coronary artery disease   . Degenerative joint disease   . 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 Dyspnea    with activity- car to house - steps  . Heart murmur    "not to be concerned"  . HTN (hypertension)   . Hyperlipidemia LDL goal <70   . Internal and external hemorrhoids without complication   . Lymphedema    right side of body  . Lymphedema of arm    right  . Morbid obesity ([place])   . Surgical  wound, non healing ABDOMINAL    has wound vac @ 125 mm Hg    Allergies:  Allergies  Allergen Reactions  . Ace Inhibitors Cough    MEDICATIONS: . aspirin EC  81 mg Oral Daily  . bisacodyl  10 mg Rectal Once  . docusate sodium  100 mg Oral BID  . fenofibrate  160 mg Oral Daily  . gabapentin  100 mg Oral QHS  . irbesartan  150 mg Oral Daily   And  . hydrochlorothiazide  12.5 mg Oral Daily  . sertraline  50 mg Oral Daily  . traZODone  150 mg Oral QHS    Social History   Tobacco Use  . Smoking status: Former Smoker    Packs/day: 1.00    Years: 25.00    Pack years: 25.00    Types: Cigarettes    Last attempt to quit: 11/17/2011    Years since quitting: 5.8  . Smokeless tobacco: Never Used  Substance Use Topics  . Alcohol use: No  . Drug use: No    Family History  Problem Relation Age of Onset  . Heart attack Mother   . Hypertension Sister   . Diabetes Brother   . Birth defects Son        heart defect   Review of Systems  Constitutional: positive for  Chills, but negative  for diaphoresis, activity change, appetite change, fatigue and unexpected weight change.  HENT: Negative for congestion, sore throat, rhinorrhea, sneezing, trouble swallowing and sinus pressure.  Eyes: Negative for photophobia and visual disturbance.  Respiratory: Negative for cough, chest tightness, shortness of breath, wheezing and stridor.  Cardiovascular: Negative for chest pain, palpitations and leg swelling.  Gastrointestinal: Negative for nausea, vomiting, abdominal pain, diarrhea, constipation, blood in stool, abdominal distention and anal bleeding.  Genitourinary: Negative for dysuria, hematuria, flank pain and difficulty urinating.  Musculoskeletal: Negative for myalgias, back pain, joint swelling, arthralgias and gait problem.  Skin: Negative for color change, pallor, rash and wound.  Neurological: Negative for dizziness, tremors, weakness and light-headedness.  Hematological: Negative for  adenopathy. Does not bruise/bleed easily.  Psychiatric/Behavioral: Negative for behavioral problems, confusion, sleep disturbance, dysphoric mood, decreased concentration and agitation.     OBJECTIVE: Temp:  [98.6 F (37 C)-99.3 F (37.4 C)] 98.8 F (37.1 C) (06/24 0847) Pulse Rate:  [63-65] 63 (06/24 0406) Resp:  [14] 14 (06/24 0406) BP: (130-132)/(52-53) 132/52 (06/24 0406) SpO2:  [96 %-99 %] 99 % (06/24 0406) Physical Exam  Constitutional:  oriented to person, place, and time. appears well-developed and well-nourished. No distress.  HENT: [place]/AT, PERRLA, no scleral icterus Mouth/Throat: Oropharynx is clear and moist. No oropharyngeal exudate.  Cardiovascular: Normal rate, regular rhythm and normal heart sounds. Exam reveals no gallop and no friction rub.  No murmur heard.  Pulmonary/Chest: Effort normal and breath sounds normal. No respiratory distress.  has no wheezes.  Neck = supple, no nuchal rigidity Abdominal: Soft. Bowel sounds are normal.  exhibits no distension. There is no tenderness.  Ext: left BKA, with wound vac in place. No discernible erythema. Right foot has signs of onychomycosis, no wounds Neurological: alert and oriented to person, place, and time.  Skin: Skin is warm and dry. No rash noted. No erythema.  Psychiatric: a normal mood and affect.  behavior is normal.    LABS: Results for orders placed or performed during the hospital encounter of 08/31/17 (from the past 48 hour(s))  CBC with Differential/Platelet     Status: Abnormal   Collection Time: 09/05/17  1:50 PM  Result Value Ref Range   WBC 13.9 (H) 4.0 - 10.5 K/uL   RBC 2.98 (L) 3.87 - 5.11 MIL/uL   Hemoglobin 7.8 (L) 12.0 - 15.0 g/dL   HCT 25.6 (L) 36.0 - 46.0 %   MCV 85.9 78.0 - 100.0 fL   MCH 26.2 26.0 - 34.0 pg   MCHC 30.5 30.0 - 36.0 g/dL   RDW 15.9 (H) 11.5 - 15.5 %   Platelets 336 150 - 400 K/uL   Neutrophils Relative % 60 %   Neutro Abs 8.4 (H) 1.7 - 7.7 K/uL   Lymphocytes Relative 26 %     Lymphs Abs 3.6 0.7 - 4.0 K/uL   Monocytes Relative 8 %   Monocytes Absolute 1.1 (H) 0.1 - 1.0 K/uL   Eosinophils Relative 5 %   Eosinophils Absolute 0.6 0.0 - 0.7 K/uL   Basophils Relative 0 %   Basophils Absolute 0.1 0.0 - 0.1 K/uL   Immature Granulocytes 1 %   Abs Immature Granulocytes 0.1 0.0 - 0.1 K/uL    Comment: Performed at [facility], [address], [place], [place] [address]    HISTORICAL MICRO/IMAGING Hx of enterococcus on wound cx  Assessment/Plan:  62yo F with hx of revision to left BKA, previously difficult to heal 2/2 wound dehiscence, cellulitis and deep tissue  infection. Had last revision on 6/19, now having chills, leukocytosis on POD#5. It is difficult to tell if she may have had residual enterococcal deep tissue infection at the time of her revision on 6/19-though her OR report does not suggest any overt infection.  - will check cbc with diff, bmp, and blood cx - has history of cellulitis, would recommend to treat with amox/clav x 10 d for hx of enterococcal infection - for now would treat with orals and continue with wound care recs per dr duda -I have sent in RX to her pharmacy - will see her back in the ID clinic in 7-10d  Chills = will check blood cx  Leukocytosis = will monitor as outpatient as well to see it trends down, since starting abtx  Health maintenance= will check hep C ab.

## 2017-09-05 NOTE — Progress Notes (Signed)
Patient has been seen and evaluated by infectious disease.  She will be put on oral antibiotics at discharge which should happen today.  It is not medically necessary for the patient to stay another night at this time.

## 2017-09-05 NOTE — Care Management (Signed)
Case manager spoke with Dr.Duda concerning -patient's concern about being discharged, she states she has a fever,99.4 and elevated white count. Case manager will speak with PA covering .

## 2017-09-05 NOTE — Care Management Important Message (Signed)
Important Message  Patient Details  Name: Julia West MRN: [id number] Date of Birth: 1955-08-16   Medicare Important Message Given:  Yes    Malakhai Beitler 09/05/2017, 4:00 PM

## 2017-09-05 NOTE — Discharge Summary (Addendum)
Patient ID: Julia West MRN: [id number] DOB/AGE: 1955-11-09 62 y.o.  Admit date: 08/31/2017 Discharge date: 09/06/2017  Admission Diagnoses:  Active Problems:   Infection of amputation stump, left lower extremity ([place])   Below knee amputation status, left [facility])   Discharge Diagnoses:  Same  Past Medical History:  Diagnosis Date  . Acute myocardial infarction    with a ruptured plaque in the circumflex in 2003  . Anxiety   . 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 Complication of anesthesia    states low O2 sats post-op 11/13, always slow to awaken  . Coronary artery disease   . Degenerative joint disease   . 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 Dyspnea    with activity- car to house - steps  . Heart murmur    "not to be concerned"  . HTN (hypertension)   . Hyperlipidemia LDL goal <70   . Internal and external hemorrhoids without complication   . Lymphedema    right side of body  . Lymphedema of arm    right  . Morbid obesity ([place])   . Surgical wound, non healing ABDOMINAL    has wound vac @ 125 mm Hg    Surgeries: Procedure(s): REVISION LEFT BELOW KNEE AMPUTATION on 08/31/2017   Consultants:   Discharged Condition: Improved  Hospital Course: MICAILA ZIEMBA is an 62 y.o. female who was admitted 08/31/2017 for operative treatment of<principal problem not specified>. Patient has severe unremitting pain that affects sleep, daily activities, and work/hobbies. After pre-op clearance the patient was taken to the operating room on 08/31/2017 and underwent  Procedure(s): REVISION LEFT BELOW KNEE AMPUTATION.    Patient was given perioperative antibiotics:  Anti-infectives (From admission, onward)   Start     Dose/Rate Route Frequency Ordered Stop   09/06/17 0000  amoxicillin-clavulanate (AUGMENTIN) 875-125 MG tablet     1 tablet Oral Every 12 hours 09/06/17 0803     09/05/17 2200  amoxicillin-clavulanate (AUGMENTIN) 875-125 MG per tablet 1 tablet      1 tablet Oral Every 12 hours 09/05/17 1752     08/31/17 2045  ceFAZolin (ANCEF) IVPB 2g/100 mL premix     2 g 200 mL/hr over 30 Minutes Intravenous Every 6 hours 08/31/17 1655 09/01/17 0859   08/31/17 1130  ceFAZolin (ANCEF) 3 g in dextrose 5 % 50 mL IVPB     3 g 100 mL/hr over 30 Minutes Intravenous On call to O.R. 08/30/17 1044 08/31/17 1442       Patient was given sequential compression devices, early ambulation, and chemoprophylaxis to prevent DVT.  Patient benefited maximally from hospital stay and there were no complications.    Recent vital signs:  Patient Vitals for the past 24 hrs:  BP Temp Temp src Pulse SpO2  09/06/17 0500 (!) 127/46 98.5 F (36.9 C) Oral 61 98 %  09/05/17 2143 (!) 118/45 98.7 F (37.1 C) Oral (!) 59 93 %     Recent laboratory studies:  Recent Labs    09/05/17 1350  WBC 13.9*  HGB 7.8*  HCT 25.6*  PLT 336  NA 138  K 4.5  CL 98*  CO2 32  BUN 18  CREATININE 0.99  GLUCOSE 135*  CALCIUM 8.6*     Discharge Medications:   Allergies as of 09/06/2017      Reactions   Ace Inhibitors Cough      Medication List    STOP taking these  medications   HYDROcodone-acetaminophen 7.5-325 MG tablet Commonly known as:  NORCO     TAKE these medications   amoxicillin-clavulanate 875-125 MG tablet Commonly known as:  AUGMENTIN Take 1 tablet by mouth every 12 (twelve) hours.   aspirin 81 MG tablet Take 81 mg by mouth daily.   doxycycline 100 MG tablet Commonly known as:  VIBRA-TABS Take 1 tablet (100 mg total) by mouth 2 (two) times daily.   fenofibrate 160 MG tablet TAKE 1 TABLET BY MOUTH  DAILY FOR CHOLESTEROL AND  TRIGLYCERIDES   fexofenadine 180 MG tablet Commonly known as:  ALLEGRA Take 180 mg by mouth daily.   gabapentin 100 MG capsule Commonly known as:  NEURONTIN Take 100 mg by mouth at bedtime.   hydrochlorothiazide 12.5 MG capsule Commonly known as:  MICROZIDE TAKE 1 CAPSULE BY MOUTH EVERY DAY   losartan 100 MG  tablet Commonly known as:  COZAAR TAKE 1 TABLET BY MOUTH EVERY DAY   MULTIVITAMIN ADULT Tabs Take 1 tablet by mouth daily.   nitroGLYCERIN 0.4 MG SL tablet Commonly known as:  NITROSTAT Place 1 tablet (0.4 mg total) under the tongue every 5 (five) minutes x 3 doses as needed for chest pain. If pain persist after call 911   oxyCODONE-acetaminophen 5-325 MG tablet Commonly known as:  PERCOCET/ROXICET Take 1 tablet by mouth every 4 (four) hours as needed.   sertraline 50 MG tablet Commonly known as:  ZOLOFT Take 1 tablet (50 mg total) by mouth daily.   traZODone 150 MG tablet Commonly known as:  DESYREL Take 150 mg by mouth at bedtime.   valsartan-hydrochlorothiazide 160-12.5 MG tablet Commonly known as:  DIOVAN-HCT Take 1 tablet by mouth daily.       Diagnostic Studies: No results found.  Disposition: Discharge disposition: 01-Home or Self Care       Discharge Instructions    Call MD / Call 911   Complete by:  As directed    If you experience chest pain or shortness of breath, CALL 911 and be transported to the hospital emergency room.  If you develope a fever above 101 F, pus (white drainage) or increased drainage or redness at the wound, or calf pain, call your surgeon's office.   Constipation Prevention   Complete by:  As directed    Drink plenty of fluids.  Prune juice may be helpful.  You may use a stool softener, such as Colace (over the counter) 100 mg twice a day.  Use MiraLax (over the counter) for constipation as needed.   Diet - low sodium heart healthy   Complete by:  As directed    Increase activity slowly as tolerated   Complete by:  As directed    Negative Pressure Wound Therapy - Incisional   Complete by:  As directed    Obtained Praveena plus wound VAC from the operating room for discharge.  This should remain in place for 1 week.      Follow-up Information    Newt Minion, MD In 1 week.   Specialty:  Orthopedic Surgery Contact  information: [place] [address] [phone]            Signed: Aundra Dubin 09/06/2017, 3:51 PM

## 2017-09-06 LAB — CBC WITH DIFFERENTIAL/PLATELET
Abs Immature Granulocytes: 0.1 10*3/uL (ref 0.0–0.1)
BASOS PCT: 1 %
Basophils Absolute: 0.1 10*3/uL (ref 0.0–0.1)
EOS PCT: 4 %
Eosinophils Absolute: 0.6 10*3/uL (ref 0.0–0.7)
HCT: 27 % — ABNORMAL LOW (ref 36.0–46.0)
Hemoglobin: 8.3 g/dL — ABNORMAL LOW (ref 12.0–15.0)
Immature Granulocytes: 1 %
Lymphocytes Relative: 25 %
Lymphs Abs: 3.7 10*3/uL (ref 0.7–4.0)
MCH: 26.1 pg (ref 26.0–34.0)
MCHC: 30.7 g/dL (ref 30.0–36.0)
MCV: 84.9 fL (ref 78.0–100.0)
MONO ABS: 1.4 10*3/uL — AB (ref 0.1–1.0)
Monocytes Relative: 9 %
Neutro Abs: 9 10*3/uL — ABNORMAL HIGH (ref 1.7–7.7)
Neutrophils Relative %: 60 %
PLATELETS: 379 10*3/uL (ref 150–400)
RBC: 3.18 MIL/uL — ABNORMAL LOW (ref 3.87–5.11)
RDW: 15.9 % — ABNORMAL HIGH (ref 11.5–15.5)
WBC: 14.9 10*3/uL — ABNORMAL HIGH (ref 4.0–10.5)

## 2017-09-06 MED ORDER — AMOXICILLIN-POT CLAVULANATE 875-125 MG PO TABS: 1.0000 | ORAL_TABLET | Freq: Two times a day (BID) | ORAL | 0 refills | Status: DC

## 2017-09-06 NOTE — Progress Notes (Signed)
AVS given and reviewed with pt. Printed prescriptions provided. Pravena wound vac in place. All questions answered to satisfaction. Pt awaiting family for transportation. Will continue to monitor.

## 2017-09-06 NOTE — Progress Notes (Signed)
Physical Therapy Treatment Patient Details Name: Julia West MRN: [id number] DOB: 1955/07/16 Today's Date: 09/06/2017    History of Present Illness Pt is a 62 y/o female admitted after dehiscence of the transtibial amputation, POD 1 revision left transtibial amputation. 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  PMH significant for but not limited to lymphedema and morbid obesity.     PT Comments    Pt performed functional mobility during this session.  Focus on transfer to R side.  Pt required increased assistance to transfer to her R side.  Use of bed pad to offer assistance with lateral scoot. Pt remains to await [facility] at [facility].  She continues to benefit from skilled rehab in a post acute setting.  If patient does return home she reports she does have all equipment needed but she remains most appropriate for post acute rehab.     Follow Up Recommendations  SNF     Equipment Recommendations  None recommended by PT    Recommendations for Other Services       Precautions / Restrictions Precautions Precautions: Fall Precaution Comments: wound VAC L residual limb Restrictions Weight Bearing Restrictions: Yes LLE Weight Bearing: Non weight bearing Other Position/Activity Restrictions: elevated L LE, wound vac to L LE     Mobility  Bed Mobility Overal bed mobility: Needs Assistance Bed Mobility: Sit to Supine       Sit to supine: Min assist   General bed mobility comments: min assistance to lift RLE into bed.  Cues for strategy to position into bed.    Transfers Overall transfer level: Needs assistance Equipment used: (bed pad) Transfers: Lateral/Scoot Transfers          Lateral/Scoot Transfers: Mod assist General transfer comment: Cues for technique, hand placement and foot placement.  Pt required several scoots to transfer to he R side.  She reports she transfers to her L side.    Ambulation/Gait Ambulation/Gait assistance:  (NT patient has not ambulated in 8 months.  )               Stairs             Wheelchair Mobility    Modified Rankin (Stroke Patients Only)       Balance     Sitting balance-Leahy Scale: Fair                                      Cognition Arousal/Alertness: Awake/alert Behavior During Therapy: WFL for tasks assessed/performed Overall Cognitive Status: Within Functional Limits for tasks assessed                                        Exercises      General Comments        Pertinent Vitals/Pain Pain Assessment: 0-10 Pain Score: 5  Pain Location: L residual limb Pain Descriptors / Indicators: Discomfort;Guarding Pain Intervention(s): Monitored during session;Repositioned;Ice applied    Home Living                      Prior Function            PT Goals (current goals can now be found in the care plan section) Acute Rehab PT Goals Patient Stated Goal: to go to  rehab  Potential to Achieve Goals: Fair Progress towards PT goals: Progressing toward goals    Frequency    Min 2X/week      PT Plan Current plan remains appropriate    Co-evaluation              AM-PAC PT "6 Clicks" Daily Activity  Outcome Measure  Difficulty turning over in bed (including adjusting bedclothes, sheets and blankets)?: Unable Difficulty moving from lying on back to sitting on the side of the bed? : Unable Difficulty sitting down on and standing up from a chair with arms (e.g., wheelchair, bedside commode, etc,.)?: Unable Help needed moving to and from a bed to chair (including a wheelchair)?: A Lot Help needed walking in hospital room?: Total Help needed climbing 3-5 steps with a railing? : Total 6 Click Score: 7    End of Session Equipment Utilized During Treatment: (bed pad.  ) Activity Tolerance: Patient tolerated treatment well Patient left: with call bell/phone within reach;in bed;with nursing/sitter in  room Nurse Communication: Mobility status PT Visit Diagnosis: Other abnormalities of gait and mobility (R26.89)     Time: 0931-1216 PT Time Calculation (min) (ACUTE ONLY): 18 min  Charges:  $Therapeutic Activity: 8-22 mins                    G CodesGovernor Rooks, PTA pager [phone]    Cristela Blue 09/06/2017, 3:20 PM

## 2017-09-06 NOTE — Progress Notes (Signed)
    [facility] for Infectious Disease    Date of Admission:  08/31/2017   Total days of antibiotics 4/2 amox/clav          ID: Julia West is a 63 y.o. female with hx of LLE BKA c/b hematoma/wound dehiscence with enterococcus infection s/p revision Active Problems:   Infection of amputation stump, left lower extremity ([place])   Below knee amputation status, left (HCC)    Subjective: Denies chills. Worked with pt today.   ROS: No fevers/n/v/diarrhea  Medications:  . amoxicillin-clavulanate  1 tablet Oral Q12H  . aspirin EC  81 mg Oral Daily  . bisacodyl  10 mg Rectal Once  . docusate sodium  100 mg Oral BID  . fenofibrate  160 mg Oral Daily  . gabapentin  100 mg Oral QHS  . irbesartan  150 mg Oral Daily   And  . hydrochlorothiazide  12.5 mg Oral Daily  . sertraline  50 mg Oral Daily  . traZODone  150 mg Oral QHS    Objective: Vital signs in last 24 hours: Temp:  [98.5 F (36.9 C)-98.7 F (37.1 C)] 98.5 F (36.9 C) (06/25 0500) Pulse Rate:  [59-61] 61 (06/25 0500) BP: (118-127)/(45-46) 127/46 (06/25 0500) SpO2:  [93 %-98 %] 98 % (06/25 0500) Physical Exam  Constitutional:  oriented to person, place, and time. appears well-developed and well-nourished. No distress.  HENT: [place]/AT, PERRLA, no scleral icterus Mouth/Throat: Oropharynx is clear and moist. No oropharyngeal exudate.  Cardiovascular: Normal rate, regular rhythm and normal heart sounds. Exam reveals no gallop and no friction rub.  No murmur heard.  Pulmonary/Chest: Effort normal and breath sounds normal. No respiratory distress.  has no wheezes.  Abdominal: Soft. Bowel sounds are normal.  exhibits no distension. There is no tenderness.  Ext: L BKA with wound vac in place Skin: Skin is warm and dry. No rash noted. No erythema.  Psychiatric: a normal mood and affect.  behavior is normal.    Lab Results Recent Labs    09/05/17 1350  WBC 13.9*  HGB 7.8*  HCT 25.6*  NA 138  K 4.5  CL 98*  CO2 32   BUN 18  CREATININE 0.99  Sedimentation Rate Recent Labs    09/05/17 1350  ESRSEDRATE 84*   C-Reactive Protein Recent Labs    09/05/17 1350  CRP 7.9*    Microbiology: Blood cx ngtd Studies/Results: No results found.   Assessment/Plan: Hx of BKA wound dehiscence with enterococcus s/p revision = would give 10d amox/clav  Leukocytosis = will recheck cbc to see that it is trending down  Will see back in clinic in 7-10d  [facility] for Infectious Diseases Cell: [phone] Pager: [phone]  09/06/2017, 3:53 PM

## 2017-09-06 NOTE — Progress Notes (Signed)
Subjective: 6 Days Post-Op Procedure(s) (LRB): REVISION LEFT BELOW KNEE AMPUTATION (Left) Patient reports pain as mild.  Not able to get around very well  Objective: Vital signs in last 24 hours: Temp:  [98.5 F (36.9 C)-99.6 F (37.6 C)] 98.5 F (36.9 C) (06/25 0500) Pulse Rate:  [59-65] 61 (06/25 0500) BP: (108-127)/(45-72) 127/46 (06/25 0500) SpO2:  [93 %-98 %] 98 % (06/25 0500)  Intake/Output from previous day: 06/24 0701 - 06/25 0700 In: -  Out: 1400 [Urine:1400] Intake/Output this shift: No intake/output data recorded.  Recent Labs    09/05/17 1350  HGB 7.8*   Recent Labs    09/05/17 1350  WBC 13.9*  RBC 2.98*  HCT 25.6*  PLT 336   Recent Labs    09/05/17 1350  NA 138  K 4.5  CL 98*  CO2 32  BUN 18  CREATININE 0.99  GLUCOSE 135*  CALCIUM 8.6*   No results for input(s): LABPT, INR in the last 72 hours.  Neurologically intact  Wound vac in place and functioning-400 cc serosanguinous fluid in canister    Assessment/Plan: 6 Days Post-Op Procedure(s) (LRB): REVISION LEFT BELOW KNEE AMPUTATION (Left) Up with therapy  NWB LLE Continue wound vac D/C to SNF tomorrow    Aundra Dubin 09/06/2017, 8:01 AM

## 2017-09-06 NOTE — Progress Notes (Signed)
Occupational Therapy Treatment Patient Details Name: Julia West MRN: [id number] DOB: 12-13-1955 Today's Date: 09/06/2017    History of present illness Pt is a 62 y/o female admitted after dehiscence of the transtibial amputation, POD 1 revision left transtibial amputation. 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  PMH significant for but not limited to lymphedema and morbid obesity.    OT comments  Patient is progressing well.  Patient demonstrates ability to complete lateral side scoot transfer from EOB towards R side into drop arm recliner with min assist (of 1, 2nd person available for safety only).  Discussed plan if to DC home, patient reports she will maintain limited mobility OOB only to 3:1 drop arm commode initially.  She plans on limiting LB dressing to non skid sock only (donning using sock aide) and wearing gowns only for toileting ease.  Patient is able to complete bathing from bed level with assistance from her husband.  Educated on compensatory techniques, safety, precautions, and mobility.  Continue to recommend SNF dc for mobility, activity tolerance, strength, transfers and self care training; but if she has to dc home recommend [place], [place], aide.  Spouse can provide assistance as needed. No DME needed.  Will continue to follow while admitted.       Follow Up Recommendations  SNF;Follow surgeon's recommendation for DC plan and follow-up therapies;Supervision/Assistance - 24 hour;Home health OT    Equipment Recommendations  None recommended by OT    Recommendations for Other Services      Precautions / Restrictions Precautions Precautions: Fall Precaution Comments: wound VAC L residual limb Restrictions Weight Bearing Restrictions: Yes LLE Weight Bearing: Non weight bearing Other Position/Activity Restrictions: elevated L LE, wound vac to L LE        Mobility Bed Mobility Overal bed mobility: Needs Assistance Bed Mobility: Supine to  Sit     Supine to sit: Supervision     General bed mobility comments: for safety  Transfers Overall transfer level: Needs assistance   Transfers: Lateral/Scoot Transfers          Lateral/Scoot Transfers: +2 safety/equipment;Min assist General transfer comment: good safety awareness, motor planning and control +2 for safety     Balance Overall balance assessment: Needs assistance Sitting-balance support: No upper extremity supported;Feet supported Sitting balance-Leahy Scale: Good Sitting balance - Comments: supervision for safety                                    ADL either performed or assessed with clinical judgement   ADL Overall ADL's : Needs assistance/impaired                     Lower Body Dressing: Minimal assistance;Bed level Lower Body Dressing Details (indicate cue type and reason): uses sock aide to don R sock Toilet Transfer: Requires drop arm;+2 for safety/equipment;Minimal assistance(side scoot towards R side ) Toilet Transfer Details (indicate cue type and reason): simulated to drop arm recliner, scooting towards R side with min assist +2 for safety only (edu on setup at home, reviewed assistance from spouse ) Toileting- Water quality scientist and Hygiene: Supervision/safety;Sitting/lateral lean Toileting - Clothing Manipulation Details (indicate cue type and reason): lateral lean, patient plans on wearing gowns at home for ease of toileting      Functional mobility during ADLs: Minimal assistance;+2 for safety/equipment(side scoot transfer only ) General ADL Comments: Patient highly motivated,  reports will maintain limited mobility if DC home.      Vision       Perception     Praxis      Cognition Arousal/Alertness: Awake/alert Behavior During Therapy: WFL for tasks assessed/performed Overall Cognitive Status: Within Functional Limits for tasks assessed                                          Exercises      Shoulder Instructions       General Comments      Pertinent Vitals/ Pain       Pain Assessment: Faces Faces Pain Scale: Hurts little more Pain Location: L residual limb Pain Descriptors / Indicators: Discomfort;Guarding Pain Intervention(s): Monitored during session;RN gave pain meds during session;Repositioned;Limited activity within patient's tolerance  Home Living                                          Prior Functioning/Environment              Frequency  Min 2X/week        Progress Toward Goals  OT Goals(current goals can now be found in the care plan section)  Progress towards OT goals: Progressing toward goals  Acute Rehab OT Goals Patient Stated Goal: to go to rehab  OT Goal Formulation: With patient Time For Goal Achievement: 09/15/17 Potential to Achieve Goals: Good  Plan Discharge plan remains appropriate;Frequency remains appropriate    Co-evaluation                 AM-PAC PT "6 Clicks" Daily Activity     Outcome Measure   Help from another person eating meals?: None Help from another person taking care of personal grooming?: A Little Help from another person toileting, which includes using toliet, bedpan, or urinal?: A Little Help from another person bathing (including washing, rinsing, drying)?: A Lot Help from another person to put on and taking off regular upper body clothing?: None Help from another person to put on and taking off regular lower body clothing?: A Little 6 Click Score: 19    End of Session Equipment Utilized During Treatment: Gait belt  OT Visit Diagnosis: Muscle weakness (generalized) (M62.81)   Activity Tolerance Patient tolerated treatment well;No increased pain   Patient Left in chair;with chair alarm set;with call bell/phone within reach   Nurse Communication Mobility status        Time: 1610-9604 OT Time Calculation (min): 30 min  Charges: OT General Charges $OT Visit: 1  Visit OT Treatments $Self Care/Home Management : 23-37 mins  Delight Stare, OTR/L  Pager [place] 09/06/2017, 11:45 AM

## 2017-09-06 NOTE — Progress Notes (Signed)
Patient ID: Julia West, female   DOB: [birth year], 62 y.o.   MRN: [id number]    Patient did well with PT today and was able to transfer from bed to chair unassisted.  She is now adamant that she go home and her ride will be picking her up at [place].  We will d/c home with [place] wound vac.

## 2017-09-07 ENCOUNTER — Other Ambulatory Visit: Payer: Self-pay

## 2017-09-07 DIAGNOSIS — R2681 Unsteadiness on feet: Secondary | ICD-10-CM | POA: Diagnosis not present

## 2017-09-07 DIAGNOSIS — E11610 Type 2 diabetes mellitus with diabetic neuropathic arthropathy: Secondary | ICD-10-CM | POA: Diagnosis not present

## 2017-09-07 DIAGNOSIS — Z4789 Encounter for other orthopedic aftercare: Secondary | ICD-10-CM | POA: Diagnosis not present

## 2017-09-08 ENCOUNTER — Ambulatory Visit (INDEPENDENT_AMBULATORY_CARE_PROVIDER_SITE_OTHER): Payer: Medicare Other | Admitting: Orthopedic Surgery

## 2017-09-08 ENCOUNTER — Encounter (INDEPENDENT_AMBULATORY_CARE_PROVIDER_SITE_OTHER): Payer: Self-pay | Admitting: Orthopedic Surgery

## 2017-09-08 ENCOUNTER — Telehealth (INDEPENDENT_AMBULATORY_CARE_PROVIDER_SITE_OTHER): Payer: Self-pay | Admitting: Radiology

## 2017-09-08 VITALS — Ht 66.0 in | Wt 270.0 lb

## 2017-09-08 DIAGNOSIS — T8781 Dehiscence of amputation stump: Secondary | ICD-10-CM

## 2017-09-08 NOTE — Telephone Encounter (Signed)
Dr. Sharol Given put a call [facility] to see if they will deliver another canister to the office for pick up. I advised I will call them tomorrow once we have heard back and let them know so they can come and pick up. Dr. Sharol Given said that he is not worried about the blood loss at this time.

## 2017-09-08 NOTE — Telephone Encounter (Signed)
Patient's daughter [name] called and has a few questions. 1- They have no extra cannisters for the wound vac, and she is concerned to go into the weekend and not have a replacement.  Wound filled up a whole cannister in two days last time.  2- Should they be concerned about blood loss given the amount of blood in the cannister? Please call her to discuss, thanks.

## 2017-09-08 NOTE — Progress Notes (Signed)
Office Visit Note   Patient: Julia West           Date of Birth: 1956-03-13           MRN: [id number] Visit Date: 09/08/2017              Requested by: [name], MD [place], [place] [id number] PCP: [name], MD  Chief Complaint  Patient presents with  . Left Leg - Routine Post Op    08/31/17 revision left BKA      HPI: Patient is a 62 year old woman who presents she is 1 week status post revision transtibial amputation on the left.  Patient went home from the hospital 2 days ago.  She states the wound VAC started beeping last night.  Assessment & Plan: Visit Diagnoses:  1. Dehiscence of amputation stump (HCC)     Plan: A new wound VAC canister was applied this had a good suction fit there is no drainage around the sponge there is no cellulitis.  Follow-up in 1 week to change out the [facility] dressing to a dry dressing.  Follow-Up Instructions: Return in about 1 week (around 09/15/2017).   Ortho Exam  Patient is alert, oriented, no adenopathy, well-dressed, normal affect, normal respiratory effort. Examination the wound VAC dressing is intact there is no cellulitis no signs of infection.  The drainage is clear serosanguineous appears to be resolving hematoma.  Imaging: No results found. No images are attached to the encounter.  Labs: Lab Results  Component Value Date   HGBA1C 5.4 05/09/2017   HGBA1C 5.6 02/07/2017   ESRSEDRATE 84 (H) 09/05/2017   CRP 7.9 (H) 09/05/2017   REPTSTATUS PENDING 09/05/2017   GRAMSTAIN  04/24/2012    NO WBC SEEN NO SQUAMOUS EPITHELIAL CELLS SEEN MODERATE GRAM POSITIVE COCCI IN PAIRS RARE GRAM NEGATIVE RODS   GRAMSTAIN  04/24/2012    NO WBC SEEN NO SQUAMOUS EPITHELIAL CELLS SEEN MODERATE GRAM POSITIVE COCCI IN PAIRS RARE GRAM NEGATIVE RODS   CULT  09/05/2017    NO GROWTH 3 DAYS Performed at [facility], [place] [address], [place], [place] [address]    [place] 04/24/2012     Lab  Results  Component Value Date   ALBUMIN 4.0 08/24/2017   ALBUMIN 4.6 12/10/2016   ALBUMIN 4.4 08/20/2015    Body mass index is 43.58 kg/m.  Orders:  No orders of the defined types were placed in this encounter.  No orders of the defined types were placed in this encounter.    Procedures: No procedures performed  Clinical Data: No additional findings.  ROS:  All other systems negative, except as noted in the HPI. Review of Systems  Objective: Vital Signs: Ht 5\' 6"  (1.676 m)   Wt 270 lb (122.5 kg)   BMI 43.58 kg/m   Specialty Comments:  No specialty comments available.  PMFS History: Patient Active Problem List   Diagnosis Date Noted  . Below knee amputation status, left ([place]) 08/31/2017  . Dehiscence of amputation stump ([place]) 08/29/2017  . Infection of amputation stump, left lower extremity ([place]) 06/19/2017  . Acquired absence of left leg below knee ([place]) 05/19/2017  . Bradycardia 05/07/2017  . Depression 05/07/2017  . Lymphedema 05/07/2017  . S/P BKA (below knee amputation) unilateral, left ([place]) 05/06/2017  . Gangrene of left foot ([place])   . Wound dehiscence, surgical, sequela   . Charcot foot due to diabetes mellitus ([place]) 02/09/2017  . 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 Post-menopausal bleeding 04/15/2014  . Endometrial polyp 04/09/2014  . Ventral hernia 03/06/2012  . Open abdominal wall wound 03/06/2012  . Hyperlipemia 11/12/2008  . OBESITY, MORBID 11/12/2008  . Essential hypertension 11/12/2008  . CAD (coronary artery disease) 11/12/2008   Past Medical History:  Diagnosis Date  . Acute myocardial infarction    with a ruptured plaque in the circumflex in 2003  . Anxiety   . Cancer ([place])    basal cell face  . Complication of anesthesia    states low O2 sats post-op 11/13, always slow to awaken  . Coronary artery disease   . Degenerative joint disease   . 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  Dyspnea    with activity- car to house - steps  . Heart murmur    "not to be concerned"  . HTN (hypertension)   . Hyperlipidemia LDL goal <70   . Internal and external hemorrhoids without complication   . Lymphedema    right side of body  . Lymphedema of arm    right  . Morbid obesity ([place])   . Surgical wound, non healing ABDOMINAL    has wound vac @ 125 mm Hg    Family History  Problem Relation Age of Onset  . Heart attack Mother   . Hypertension Sister   . Diabetes Brother   . Birth defects Son        heart defect    Past Surgical History:  Procedure Laterality Date  . AMPUTATION Left 05/06/2017   Procedure: BELOW KNEE AMPUTATION;  Surgeon: Newt Minion, MD;  Location: [place];  Service: Orthopedics;  Laterality: Left;  . APPLICATION OF WOUND VAC    . BOWEL RESECTION  02/07/2012   Procedure: SMALL BOWEL RESECTION;  Surgeon: Gayland Curry, MD,FACS;  Location: [name];  Service: General;;  . CARDIOVASCULAR STRESS TEST  01-17-2012  DR HOCHREIN   LOW RISK NUCLEAR TEST  . CESAREAN SECTION     x 4 in remote past  . CHOLECYSTECTOMY    . CORONARY ARTERY BYPASS GRAFT  2003   by Dr. Servando Snare. LIMA to the LAD, free RIMA to the circumflex. Stress perfusion study December 2009 with no high-risk areas of ischemia. She has a well-preserved ejection fraction  . HYSTEROSCOPY W/D&C N/A 05/14/2014   Procedure: DILATATION AND CURETTAGE (no specimen); HYSTEROSCOPY;  Surgeon: Jonnie Kind, MD;  Location: [facility];  Service: Gynecology;  Laterality: N/A;  . I&D EXTREMITY Left 06/21/2017   Procedure: IRRIGATION AND DEBRIDEMENT OF LEFT LEG AMPUTATION SITE;  Surgeon: Wallace Going, DO;  Location: [facility];  Service: Plastics;  Laterality: Left;  . INCISION AND DRAINAGE OF WOUND  04/17/2012   Procedure: IRRIGATION AND DEBRIDEMENT WOUND;  Surgeon: Theodoro Kos, DO;  Location: [place];  Service: Plastics;  Laterality: N/A;  OF ABDOMINAL WOUND, SURGICAL PREP AND PLACEMENT OF VAC,  REMOVAL FOREHEAD SKIN LESION  . INCISION AND DRAINAGE OF WOUND N/A 04/24/2012   Procedure: IRRIGATION AND DEBRIDEMENT WOUND;  Surgeon: Theodoro Kos, DO;  Location: [place];  Service: Plastics;  Laterality: N/A;  I & D ABDOMINAL WOUND WITH VAC AND ACELL  . INCISION AND DRAINAGE OF WOUND N/A 05/01/2012   Procedure: IRRIGATION AND DEBRIDEMENT WOUND;  Surgeon: Theodoro Kos, DO;  Location: [place];  Service: Plastics;  Laterality: N/A;  WITH SURGICAL PREP AND PLACEMENT OF  VAC  . INCISION AND DRAINAGE OF WOUND N/A 05/08/2012   Procedure: IRRIGATION AND DEBRIDEMENT OF ABD WOUND SURGICAL PREP AND PLACEMENT OF VAC ;  Surgeon: Theodoro Kos, DO;  Location: [place];  Service: Plastics;  Laterality: N/A;  IRRIGATION AND DEBRIDEMENT OF ABD WOUND SURGICAL PREP AND PLACEMENT OF VAC   . INCISION AND DRAINAGE OF WOUND N/A 05/15/2012   Procedure: IRRIGATION AND DEBRIDEMENT OF ABDOMINAL WOUND WITH POSSIBLE SURGICAL PREP AND PLACEMENT OF VAC;  Surgeon: Theodoro Kos, DO;  Location: [place];  Service: Plastics;  Laterality: N/A;  . INCISION AND DRAINAGE OF WOUND N/A 05/22/2012   Procedure: IRRIGATION AND DEBRIDEMENT OF ABODOMINAL WOUND WITH  SURGICAL PREP AND VAC PLACEMENT;  Surgeon: Theodoro Kos, DO;  Location: [place];  Service: Plastics;  Laterality: N/A;  . INCISION AND DRAINAGE OF WOUND N/A 06/28/2012   Procedure: IRRIGATION AND DEBRIDEMENT OF ABDOMINAL ULCER SURGICAL PREP AND PLACEMENT OF ACELL AND VAC;  Surgeon: Theodoro Kos, DO;  Location: [place];  Service: Plastics;  Laterality: N/A;  . INCISIONAL HERNIA REPAIR  02/07/2012   Procedure: HERNIA REPAIR INCISIONAL;  Surgeon: Gayland Curry, MD,FACS;  Location: [place];  Service: General;;  Open, Primary repair, strangulated Incisional hernia.  Marland Kitchen LESION REMOVAL  04/17/2012   Procedure: LESION REMOVAL;  Surgeon: Theodoro Kos, DO;  Location: [place];  Service: Plastics;  Laterality: N/A;   CENTER OF FOREHEAD, REMOVAL FORHEAD SKIN LESION  . ORIF TOE FRACTURE Left 02/09/2017   Procedure: EXCISION TALAR HEAD, INTERNAL FIXATION MEDIAL COLUMN LEFT FOOT;  Surgeon: Newt Minion, MD;  Location: [place];  Service: Orthopedics;  Laterality: Left;  . POLYPECTOMY N/A 05/14/2014   Procedure: ENDOMETRIAL POLYPECTOMY;  Surgeon: Jonnie Kind, MD;  Location: [facility];  Service: Gynecology;  Laterality: N/A;  . STUMP REVISION Left 08/31/2017   Procedure: REVISION LEFT BELOW KNEE AMPUTATION;  Surgeon: Newt Minion, MD;  Location: [place];  Service: Orthopedics;  Laterality: Left;   Social History   Occupational History  . Occupation: Estate manager/land agent    Comment: In the [employer]  Tobacco Use  . Smoking status: Former Smoker    Packs/day: 1.00    Years: 25.00    Pack years: 25.00    Types: Cigarettes    Last attempt to quit: 11/17/2011    Years since quitting: 5.8  . Smokeless tobacco: Never Used  Substance and Sexual Activity  . Alcohol use: No  . Drug use: No  . Sexual activity: Yes    Birth control/protection: Post-menopausal

## 2017-09-09 NOTE — Telephone Encounter (Signed)
I spoke with pt's daughter and advised that we do not have any canister and that [employer] did not bring any in. She is concerned about the vac canister will not last the weekend and that they would go to the ER if it started to alarm. Holding message. Text sent to Dr. Sharol Given for Pagie G[facility] rep info) will try to call again.

## 2017-09-10 LAB — CULTURE, BLOOD (ROUTINE X 2)
Culture: NO GROWTH
Culture: NO GROWTH
SPECIAL REQUESTS: ADEQUATE
SPECIAL REQUESTS: ADEQUATE

## 2017-09-12 ENCOUNTER — Ambulatory Visit (INDEPENDENT_AMBULATORY_CARE_PROVIDER_SITE_OTHER): Payer: Medicare Other | Admitting: Orthopedic Surgery

## 2017-09-12 ENCOUNTER — Ambulatory Visit: Payer: Self-pay | Admitting: *Deleted

## 2017-09-12 ENCOUNTER — Encounter (INDEPENDENT_AMBULATORY_CARE_PROVIDER_SITE_OTHER): Payer: Self-pay | Admitting: Orthopedic Surgery

## 2017-09-12 DIAGNOSIS — T8781 Dehiscence of amputation stump: Secondary | ICD-10-CM

## 2017-09-12 NOTE — Progress Notes (Signed)
Office Visit Note   Patient: Julia West           Date of Birth: 1955/10/03           MRN: [id number] Visit Date: 09/12/2017              Requested by: [name], MD [place], [place] [address] PCP: [name], MD  No chief complaint on file.     HPI: Patient is a 62 year old woman status post revision transtibial amputation on the left.  The wound VAC canister was fall and patient presents at this time for dressing change.  Assessment & Plan: Visit Diagnoses:  1. Dehiscence of amputation stump (HCC)     Plan: Dry dressing change daily with Dial soap cleansing.  Discussed the importance of protein supplementation elevation.  Follow-Up Instructions: Return in about 2 weeks (around 09/26/2017).   Ortho Exam  Patient is alert, oriented, no adenopathy, well-dressed, normal affect, normal respiratory effort. Examination the wound edges are well approximated there is no redness no cellulitis no dermatitis no signs of infection.  There is no wound dehiscence.  Imaging: No results found. No images are attached to the encounter.  Labs: Lab Results  Component Value Date   HGBA1C 5.4 05/09/2017   HGBA1C 5.6 02/07/2017   ESRSEDRATE 84 (H) 09/05/2017   CRP 7.9 (H) 09/05/2017   REPTSTATUS 09/10/2017 FINAL 09/05/2017   GRAMSTAIN  04/24/2012    NO WBC SEEN NO SQUAMOUS EPITHELIAL CELLS SEEN MODERATE GRAM POSITIVE COCCI IN PAIRS RARE GRAM NEGATIVE RODS   GRAMSTAIN  04/24/2012    NO WBC SEEN NO SQUAMOUS EPITHELIAL CELLS SEEN MODERATE GRAM POSITIVE COCCI IN PAIRS RARE GRAM NEGATIVE RODS   CULT  09/05/2017    NO GROWTH 5 DAYS Performed at [facility], [place] [address], [place], [place] [address]    [place] 04/24/2012     Lab Results  Component Value Date   ALBUMIN 4.0 08/24/2017   ALBUMIN 4.6 12/10/2016   ALBUMIN 4.4 08/20/2015    There is no height or weight on file to calculate BMI.  Orders:  No orders of the  defined types were placed in this encounter.  No orders of the defined types were placed in this encounter.    Procedures: No procedures performed  Clinical Data: No additional findings.  ROS:  All other systems negative, except as noted in the HPI. Review of Systems  Objective: Vital Signs: There were no vitals taken for this visit.  Specialty Comments:  No specialty comments available.  PMFS History: Patient Active Problem List   Diagnosis Date Noted  . Below knee amputation status, left ([place]) 08/31/2017  . Dehiscence of amputation stump ([place]) 08/29/2017  . Infection of amputation stump, left lower extremity ([place]) 06/19/2017  . Acquired absence of left leg below knee ([place]) 05/19/2017  . Bradycardia 05/07/2017  . Depression 05/07/2017  . Lymphedema 05/07/2017  . S/P BKA (below knee amputation) unilateral, left ([place]) 05/06/2017  . Gangrene of left foot ([place])   . Wound dehiscence, surgical, sequela   . Charcot foot due to diabetes mellitus ([place]) 02/09/2017  . 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 Post-menopausal bleeding 04/15/2014  . Endometrial polyp 04/09/2014  . Ventral hernia 03/06/2012  . Open abdominal wall wound 03/06/2012  . Hyperlipemia 11/12/2008  . OBESITY, MORBID 11/12/2008  . Essential hypertension 11/12/2008  . CAD (coronary artery disease) 11/12/2008   Past  Medical History:  Diagnosis Date  . Acute myocardial infarction    with a ruptured plaque in the circumflex in 2003  . Anxiety   . Cancer ([place])    basal cell face  . Complication of anesthesia    states low O2 sats post-op 11/13, always slow to awaken  . Coronary artery disease   . Degenerative joint disease   . 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 Dyspnea    with activity- car to house - steps  . Heart murmur    "not to be concerned"  . HTN (hypertension)   . Hyperlipidemia LDL goal <70   . Internal and external hemorrhoids without  complication   . Lymphedema    right side of body  . Lymphedema of arm    right  . Morbid obesity ([place])   . Surgical wound, non healing ABDOMINAL    has wound vac @ 125 mm Hg    Family History  Problem Relation Age of Onset  . Heart attack Mother   . Hypertension Sister   . Diabetes Brother   . Birth defects Son        heart defect    Past Surgical History:  Procedure Laterality Date  . AMPUTATION Left 05/06/2017   Procedure: BELOW KNEE AMPUTATION;  Surgeon: Newt Minion, MD;  Location: [place];  Service: Orthopedics;  Laterality: Left;  . APPLICATION OF WOUND VAC    . BOWEL RESECTION  02/07/2012   Procedure: SMALL BOWEL RESECTION;  Surgeon: Gayland Curry, MD,FACS;  Location: [place];  Service: General;;  . CARDIOVASCULAR STRESS TEST  01-17-2012  DR HOCHREIN   LOW RISK NUCLEAR TEST  . CESAREAN SECTION     x 4 in remote past  . CHOLECYSTECTOMY    . CORONARY ARTERY BYPASS GRAFT  2003   by Dr. Servando Snare. LIMA to the LAD, free RIMA to the circumflex. Stress perfusion study December 2009 with no high-risk areas of ischemia. She has a well-preserved ejection fraction  . HYSTEROSCOPY W/D&C N/A 05/14/2014   Procedure: DILATATION AND CURETTAGE (no specimen); HYSTEROSCOPY;  Surgeon: Jonnie Kind, MD;  [facility];  Service: Gynecology;  Laterality: N/A;  . I&D EXTREMITY Left 06/21/2017   Procedure: IRRIGATION AND DEBRIDEMENT OF LEFT LEG AMPUTATION SITE;  Surgeon: Wallace Going, DO;  [facility];  Service: Plastics;  Laterality: Left;  . INCISION AND DRAINAGE OF WOUND  04/17/2012   Procedure: IRRIGATION AND DEBRIDEMENT WOUND;  Surgeon: Theodoro Kos, DO;  Location: [place];  Service: Plastics;  Laterality: N/A;  OF ABDOMINAL WOUND, SURGICAL PREP AND PLACEMENT OF VAC, REMOVAL FOREHEAD SKIN LESION  . INCISION AND DRAINAGE OF WOUND N/A 04/24/2012   Procedure: IRRIGATION AND DEBRIDEMENT WOUND;  Surgeon: Theodoro Kos, DO;  Location: [place];   Service: Plastics;  Laterality: N/A;  I & D ABDOMINAL WOUND WITH VAC AND ACELL  . INCISION AND DRAINAGE OF WOUND N/A 05/01/2012   Procedure: IRRIGATION AND DEBRIDEMENT WOUND;  Surgeon: Theodoro Kos, DO;  Location: [place];  Service: Plastics;  Laterality: N/A;  WITH SURGICAL PREP AND PLACEMENT OF VAC  . INCISION AND DRAINAGE OF WOUND N/A 05/08/2012   Procedure: IRRIGATION AND DEBRIDEMENT OF ABD WOUND SURGICAL PREP AND PLACEMENT OF VAC ;  Surgeon: Theodoro Kos, DO;  Location: [place];  Service: Plastics;  Laterality: N/A;  IRRIGATION AND DEBRIDEMENT OF ABD WOUND SURGICAL PREP AND PLACEMENT OF  VAC   . INCISION AND DRAINAGE OF WOUND N/A 05/15/2012   Procedure: IRRIGATION AND DEBRIDEMENT OF ABDOMINAL WOUND WITH POSSIBLE SURGICAL PREP AND PLACEMENT OF VAC;  Surgeon: Theodoro Kos, DO;  Location: [place];  Service: Plastics;  Laterality: N/A;  . INCISION AND DRAINAGE OF WOUND N/A 05/22/2012   Procedure: IRRIGATION AND DEBRIDEMENT OF ABODOMINAL WOUND WITH  SURGICAL PREP AND VAC PLACEMENT;  Surgeon: Theodoro Kos, DO;  Location: [place];  Service: Plastics;  Laterality: N/A;  . INCISION AND DRAINAGE OF WOUND N/A 06/28/2012   Procedure: IRRIGATION AND DEBRIDEMENT OF ABDOMINAL ULCER SURGICAL PREP AND PLACEMENT OF ACELL AND VAC;  Surgeon: Theodoro Kos, DO;  Location: [place];  Service: Plastics;  Laterality: N/A;  . INCISIONAL HERNIA REPAIR  02/07/2012   Procedure: HERNIA REPAIR INCISIONAL;  Surgeon: Gayland Curry, MD,FACS;  Location: [place];  Service: General;;  Open, Primary repair, strangulated Incisional hernia.  Marland Kitchen LESION REMOVAL  04/17/2012   Procedure: LESION REMOVAL;  Surgeon: Theodoro Kos, DO;  Location: [place];  Service: Plastics;  Laterality: N/A;   CENTER OF FOREHEAD, REMOVAL FORHEAD SKIN LESION  . ORIF TOE FRACTURE Left 02/09/2017   Procedure: EXCISION TALAR HEAD, INTERNAL FIXATION MEDIAL  COLUMN LEFT FOOT;  Surgeon: Newt Minion, MD;  Location: [place];  Service: Orthopedics;  Laterality: Left;  . POLYPECTOMY N/A 05/14/2014   Procedure: ENDOMETRIAL POLYPECTOMY;  Surgeon: Jonnie Kind, MD;  [facility];  Service: Gynecology;  Laterality: N/A;  . STUMP REVISION Left 08/31/2017   Procedure: REVISION LEFT BELOW KNEE AMPUTATION;  Surgeon: Newt Minion, MD;  Location: [place];  Service: Orthopedics;  Laterality: Left;   Social History   Occupational History  . Occupation: Estate manager/land agent    Comment: In the [employer]  Tobacco Use  . Smoking status: Former Smoker    Packs/day: 1.00    Years: 25.00    Pack years: 25.00    Types: Cigarettes    Last attempt to quit: 11/17/2011    Years since quitting: 5.8  . Smokeless tobacco: Never Used  Substance and Sexual Activity  . Alcohol use: No  . Drug use: No  . Sexual activity: Yes    Birth control/protection: Post-menopausal

## 2017-09-12 NOTE — Telephone Encounter (Signed)
Pt was in the office this morning.

## 2017-09-13 DIAGNOSIS — S31105A Unspecified open wound of abdominal wall, periumbilic region without penetration into peritoneal cavity, initial encounter: Secondary | ICD-10-CM | POA: Diagnosis not present

## 2017-09-13 DIAGNOSIS — T8189XA Other complications of procedures, not elsewhere classified, initial encounter: Secondary | ICD-10-CM | POA: Diagnosis not present

## 2017-09-13 DIAGNOSIS — Z89512 Acquired absence of left leg below knee: Secondary | ICD-10-CM | POA: Diagnosis not present

## 2017-09-14 ENCOUNTER — Ambulatory Visit (INDEPENDENT_AMBULATORY_CARE_PROVIDER_SITE_OTHER): Payer: Medicare Other | Admitting: Family

## 2017-09-16 DIAGNOSIS — S31105A Unspecified open wound of abdominal wall, periumbilic region without penetration into peritoneal cavity, initial encounter: Secondary | ICD-10-CM | POA: Diagnosis not present

## 2017-09-16 DIAGNOSIS — Z89512 Acquired absence of left leg below knee: Secondary | ICD-10-CM | POA: Diagnosis not present

## 2017-09-16 DIAGNOSIS — T8189XA Other complications of procedures, not elsewhere classified, initial encounter: Secondary | ICD-10-CM | POA: Diagnosis not present

## 2017-09-19 ENCOUNTER — Encounter: Payer: Self-pay | Admitting: Licensed Clinical Social Worker

## 2017-09-19 ENCOUNTER — Other Ambulatory Visit: Payer: Self-pay | Admitting: Licensed Clinical Social Worker

## 2017-09-19 DIAGNOSIS — S31105A Unspecified open wound of abdominal wall, periumbilic region without penetration into peritoneal cavity, initial encounter: Secondary | ICD-10-CM | POA: Diagnosis not present

## 2017-09-19 DIAGNOSIS — Z89512 Acquired absence of left leg below knee: Secondary | ICD-10-CM | POA: Diagnosis not present

## 2017-09-19 DIAGNOSIS — T8189XA Other complications of procedures, not elsewhere classified, initial encounter: Secondary | ICD-10-CM | POA: Diagnosis not present

## 2017-09-19 NOTE — Patient Outreach (Signed)
Assessment:  CSW received referral on [place] [employer]. CSW completed chart review on Julia West on 09/19/17. CSW called Julia West on 09/19/17 and spoke via phone with Julia West on 09/19/17.  CSW verified identity of Julia West. CSW received verbal permssoin from Julia West for CSW to speak with Julia West about her needs and status at this time. Client said she has good familiy support. She said that her children and her sister bring her meals often. Her spouse helps her with in home care needs.  She said she is able to bathe and dress herself.  She said that her spouse drives her to and from needed medical appointments. Client has a Printmaker with a ramp system that enables her to get on and off [name] for transport of client.  CSW and client completed needed client assessments. Client said she has return appointment with Dr. Sharol Given on 09/28/17.  CSW spoke with client about her nursing needs and [facility] nursing support. She declined [facility] nursing support at this time. CSW did speak with client about [facility] program support in social work, nursing and pharmacy.  CSW spoke with her about client care plan.  CSW encouraged Julia West to communicate with CSW in next 30 days to discuss community resources of assistance to client.Client said she had periodic pain issues. She said she is taking a pain medication as prescribed. She said she wanted to complete her appointment with Dr. Sharol Given on 09/28/17 before scheduling any other medical appointments for client.  CSW thanked client for phone call with CSW on 09/19/17. CSW encouraged Julia West to call CSW at [phone] as needed to discuss social work needs of client.  Plan:  Client to communicate with CSW in next 30 days to discuss community resources of assistance to client.  CSW to call client in 2 weeks to assess client needs at that time.  Norva Riffle.Roniesha Hollingshead MSW, LCSW Licensed Clinical Social Worker [facility] Care Management [phone]

## 2017-09-22 DIAGNOSIS — T8189XA Other complications of procedures, not elsewhere classified, initial encounter: Secondary | ICD-10-CM | POA: Diagnosis not present

## 2017-09-22 DIAGNOSIS — S31105A Unspecified open wound of abdominal wall, periumbilic region without penetration into peritoneal cavity, initial encounter: Secondary | ICD-10-CM | POA: Diagnosis not present

## 2017-09-22 DIAGNOSIS — Z89512 Acquired absence of left leg below knee: Secondary | ICD-10-CM | POA: Diagnosis not present

## 2017-09-26 DIAGNOSIS — Z89512 Acquired absence of left leg below knee: Secondary | ICD-10-CM | POA: Diagnosis not present

## 2017-09-26 DIAGNOSIS — T8189XA Other complications of procedures, not elsewhere classified, initial encounter: Secondary | ICD-10-CM | POA: Diagnosis not present

## 2017-09-26 DIAGNOSIS — S31105A Unspecified open wound of abdominal wall, periumbilic region without penetration into peritoneal cavity, initial encounter: Secondary | ICD-10-CM | POA: Diagnosis not present

## 2017-09-28 ENCOUNTER — Ambulatory Visit (INDEPENDENT_AMBULATORY_CARE_PROVIDER_SITE_OTHER): Payer: Medicare Other | Admitting: Family

## 2017-09-28 ENCOUNTER — Ambulatory Visit: Payer: Medicare Other | Admitting: Cardiology

## 2017-09-28 ENCOUNTER — Other Ambulatory Visit: Payer: Self-pay | Admitting: Licensed Clinical Social Worker

## 2017-09-28 ENCOUNTER — Encounter (INDEPENDENT_AMBULATORY_CARE_PROVIDER_SITE_OTHER): Payer: Self-pay | Admitting: Family

## 2017-09-28 DIAGNOSIS — IMO0002 Reserved for concepts with insufficient information to code with codable children: Secondary | ICD-10-CM

## 2017-09-28 DIAGNOSIS — Z89512 Acquired absence of left leg below knee: Secondary | ICD-10-CM

## 2017-09-28 NOTE — Progress Notes (Signed)
Office Visit Note   Patient: Julia West           Date of Birth: 1956/03/09           MRN: [id number] Visit Date: 09/28/2017              Requested by: [name], MD [place], [place] [address] PCP: [name], MD  Chief Complaint  Patient presents with  . Left Knee - Pain, Follow-up      HPI: Patient is a 62 year old woman status post revision transtibial amputation on the left.   Assessment & Plan: Visit Diagnoses:  1. Below knee amputation status, left ([place])     Plan: Sutures harvested today.  Dry dressing change daily with Dial soap cleansing.  Discussed the importance of protein supplementation elevation.  Encouraged patient to begin wearing her shrinker daily  Follow-Up Instructions: Return in about 2 weeks (around 10/12/2017).   Ortho Exam  Patient is alert, oriented, no adenopathy, well-dressed, normal affect, normal respiratory effort. Examination the incision is healing well.  There are 3 open areas with moderate bloody drainage.  The patient declines probing of the wound.  There is no redness no cellulitis no dermatitis no signs of infection.  There is no wound dehiscence.  Imaging: No results found. No images are attached to the encounter.  Labs: Lab Results  Component Value Date   HGBA1C 5.4 05/09/2017   HGBA1C 5.6 02/07/2017   ESRSEDRATE 84 (H) 09/05/2017   CRP 7.9 (H) 09/05/2017   REPTSTATUS 09/10/2017 FINAL 09/05/2017   GRAMSTAIN  04/24/2012    NO WBC SEEN NO SQUAMOUS EPITHELIAL CELLS SEEN MODERATE GRAM POSITIVE COCCI IN PAIRS RARE GRAM NEGATIVE RODS   GRAMSTAIN  04/24/2012    NO WBC SEEN NO SQUAMOUS EPITHELIAL CELLS SEEN MODERATE GRAM POSITIVE COCCI IN PAIRS RARE GRAM NEGATIVE RODS   CULT  09/05/2017    NO GROWTH 5 DAYS Performed at [facility], [place] [address] [place], [place] [address]    [place] 04/24/2012     Lab Results  Component Value Date   ALBUMIN 4.0 08/24/2017   ALBUMIN  4.6 12/10/2016   ALBUMIN 4.4 08/20/2015    There is no height or weight on file to calculate BMI.  Orders:  No orders of the defined types were placed in this encounter.  No orders of the defined types were placed in this encounter.    Procedures: No procedures performed  Clinical Data: No additional findings.  ROS:  All other systems negative, except as noted in the HPI. Review of Systems  Constitutional: Negative for chills and fever.    Objective: Vital Signs: There were no vitals taken for this visit.  Specialty Comments:  No specialty comments available.  PMFS History: Patient Active Problem List   Diagnosis Date Noted  . Below knee amputation status, left ([place]) 08/31/2017  . Dehiscence of amputation stump ([place]) 08/29/2017  . Infection of amputation stump, left lower extremity ([place]) 06/19/2017  . Bradycardia 05/07/2017  . Depression 05/07/2017  . Lymphedema 05/07/2017  . Charcot foot due to diabetes mellitus ([place]) 02/09/2017  . 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 Post-menopausal bleeding 04/15/2014  . Endometrial polyp 04/09/2014  . Ventral hernia 03/06/2012  . Open abdominal wall wound 03/06/2012  . Hyperlipemia 11/12/2008  . OBESITY, MORBID 11/12/2008  . Essential hypertension 11/12/2008  . CAD (coronary artery disease) 11/12/2008   Past Medical History:  Diagnosis Date  . Acute myocardial infarction    with a ruptured plaque in the circumflex in 2003  . Anxiety   . Cancer ([place])    basal cell face  . Complication of anesthesia    states low O2 sats post-op 11/13, always slow to awaken  . Coronary artery disease   . Degenerative joint disease   . 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 Dyspnea    with activity- car to house - steps  . Heart murmur    "not to be concerned"  . HTN (hypertension)   . Hyperlipidemia LDL goal <70   . Internal and external hemorrhoids without complication   .  Lymphedema    right side of body  . Lymphedema of arm    right  . Morbid obesity ([place])   . Surgical wound, non healing ABDOMINAL    has wound vac @ 125 mm Hg    Family History  Problem Relation Age of Onset  . Heart attack Mother   . Hypertension Sister   . Diabetes Brother   . Birth defects Son        heart defect    Past Surgical History:  Procedure Laterality Date  . AMPUTATION Left 05/06/2017   Procedure: BELOW KNEE AMPUTATION;  Surgeon: Newt Minion, MD;  Location: [place];  Service: Orthopedics;  Laterality: Left;  . APPLICATION OF WOUND VAC    . BOWEL RESECTION  02/07/2012   Procedure: SMALL BOWEL RESECTION;  Surgeon: Gayland Curry, MD,FACS;  Location: [place];  Service: General;;  . CARDIOVASCULAR STRESS TEST  01-17-2012  DR HOCHREIN   LOW RISK NUCLEAR TEST  . CESAREAN SECTION     x 4 in remote past  . CHOLECYSTECTOMY    . CORONARY ARTERY BYPASS GRAFT  2003   by Dr. Servando Snare. LIMA to the LAD, free RIMA to the circumflex. Stress perfusion study December 2009 with no high-risk areas of ischemia. She has a well-preserved ejection fraction  . HYSTEROSCOPY W/D&C N/A 05/14/2014   Procedure: DILATATION AND CURETTAGE (no specimen); HYSTEROSCOPY;  Surgeon: Jonnie Kind, MD;  Location[facility];  Service: Gynecology;  Laterality: N/A;  . I&D EXTREMITY Left 06/21/2017   Procedure: IRRIGATION AND DEBRIDEMENT OF LEFT LEG AMPUTATION SITE;  Surgeon: Wallace Going, DO;  [facility];  Service: Plastics;  Laterality: Left;  . INCISION AND DRAINAGE OF WOUND  04/17/2012   Procedure: IRRIGATION AND DEBRIDEMENT WOUND;  Surgeon: Theodoro Kos, DO;  Location: [place];  Service: Plastics;  Laterality: N/A;  OF ABDOMINAL WOUND, SURGICAL PREP AND PLACEMENT OF VAC, REMOVAL FOREHEAD SKIN LESION  . INCISION AND DRAINAGE OF WOUND N/A 04/24/2012   Procedure: IRRIGATION AND DEBRIDEMENT WOUND;  Surgeon: Theodoro Kos, DO;  Location: [place];  Service: Plastics;   Laterality: N/A;  I & D ABDOMINAL WOUND WITH VAC AND ACELL  . INCISION AND DRAINAGE OF WOUND N/A 05/01/2012   Procedure: IRRIGATION AND DEBRIDEMENT WOUND;  Surgeon: Theodoro Kos, DO;  Location: [place];  Service: Plastics;  Laterality: N/A;  WITH SURGICAL PREP AND PLACEMENT OF VAC  . INCISION AND DRAINAGE OF WOUND N/A 05/08/2012   Procedure: IRRIGATION AND DEBRIDEMENT OF ABD WOUND SURGICAL PREP AND PLACEMENT OF VAC ;  Surgeon: Theodoro Kos, DO;  Location: [place];  Service: Plastics;  Laterality: N/A;  IRRIGATION AND DEBRIDEMENT OF ABD WOUND SURGICAL PREP AND PLACEMENT OF VAC   .  INCISION AND DRAINAGE OF WOUND N/A 05/15/2012   Procedure: IRRIGATION AND DEBRIDEMENT OF ABDOMINAL WOUND WITH POSSIBLE SURGICAL PREP AND PLACEMENT OF VAC;  Surgeon: Theodoro Kos, DO;  Location: [place];  Service: Plastics;  Laterality: N/A;  . INCISION AND DRAINAGE OF WOUND N/A 05/22/2012   Procedure: IRRIGATION AND DEBRIDEMENT OF ABODOMINAL WOUND WITH  SURGICAL PREP AND VAC PLACEMENT;  Surgeon: Theodoro Kos, DO;  Location: [place];  Service: Plastics;  Laterality: N/A;  . INCISION AND DRAINAGE OF WOUND N/A 06/28/2012   Procedure: IRRIGATION AND DEBRIDEMENT OF ABDOMINAL ULCER SURGICAL PREP AND PLACEMENT OF ACELL AND VAC;  Surgeon: Theodoro Kos, DO;  Location: [place];  Service: Plastics;  Laterality: N/A;  . INCISIONAL HERNIA REPAIR  02/07/2012   Procedure: HERNIA REPAIR INCISIONAL;  Surgeon: Gayland Curry, MD,FACS;  Location: [place];  Service: General;;  Open, Primary repair, strangulated Incisional hernia.  Marland Kitchen LESION REMOVAL  04/17/2012   Procedure: LESION REMOVAL;  Surgeon: Theodoro Kos, DO;  Location: [place];  Service: Plastics;  Laterality: N/A;   CENTER OF FOREHEAD, REMOVAL FORHEAD SKIN LESION  . ORIF TOE FRACTURE Left 02/09/2017   Procedure: EXCISION TALAR HEAD, INTERNAL FIXATION MEDIAL COLUMN LEFT FOOT;   Surgeon: Newt Minion, MD;  Location: [place];  Service: Orthopedics;  Laterality: Left;  . POLYPECTOMY N/A 05/14/2014   Procedure: ENDOMETRIAL POLYPECTOMY;  Surgeon: Jonnie Kind, MD;  [facility];  Service: Gynecology;  Laterality: N/A;  . STUMP REVISION Left 08/31/2017   Procedure: REVISION LEFT BELOW KNEE AMPUTATION;  Surgeon: Newt Minion, MD;  Location: [place];  Service: Orthopedics;  Laterality: Left;   Social History   Occupational History  . Occupation: Estate manager/land agent    Comment: In the [employer]  Tobacco Use  . Smoking status: Former Smoker    Packs/day: 1.00    Years: 25.00    Pack years: 25.00    Types: Cigarettes    Last attempt to quit: 11/17/2011    Years since quitting: 5.8  . Smokeless tobacco: Never Used  Substance and Sexual Activity  . Alcohol use: No  . Drug use: No  . Sexual activity: Yes    Birth control/protection: Post-menopausal

## 2017-09-28 NOTE — Patient Outreach (Signed)
Assessment:  CSW spoke via phone with client . CSW verified client identity. CSW received verbal permission from client for CSW to speak with client about client needs. CSW spoke with client about client care plan. CSW encouraged client to talk with CSW in next 30 days to discuss community resources of assistance to client. CSW discussed with client ways CSW could inform client related to various types of community resources services.  CSW also encouraged client to use relaxation techniques to help her manage stress. She said she likes to read, do crossword puzzles, complete regular puzzles, or sit or porch as ways to help her relax and manage stress. CSW also informed client of Julia West services, such as Health Coach support, telephonic nurse support, or RN [facility] Care nurse support. Client will think about these nursing options to determine if she feels she needs any such nursing support.  Client said she has support from her spouse and from her daughter.  She said her spouse helps drive client to and from client's medical appointments. Client said her daughter and sister help in bringing meals occasionally to client home. Client did have a left below the knee amputation. She is seeing Dr. Sharol Given , as scheduled, related to this surgery site.  She had an appointment with Dr. Sharol Given on 09/28/17.  Client also has appointment scheduled with Dr. Sharol Given for 10/13/17.  Client had appintment with Dr. Livia Snellen, her primary care doctor, in May of 2019.Client said she is eating adequately, has her prescribed medications and has no pain issues. CSW thanked client for phone call and encouraged September to call CSW as needed to discuss social work needs of client. Julia West was appreciative of phone call from [place] on 09/28/17.  Plan:  Client to talk with CSW in next 30 days to discuss community resources of assistance to client at this time.  CSW to call client in 4 weeks to assess client needs at that time.  Julia West.Julia West  MSW, LCSW Licensed Clinical Social Worker [facility] Care Management [phone]

## 2017-10-03 DIAGNOSIS — T8189XA Other complications of procedures, not elsewhere classified, initial encounter: Secondary | ICD-10-CM | POA: Diagnosis not present

## 2017-10-03 DIAGNOSIS — Z89512 Acquired absence of left leg below knee: Secondary | ICD-10-CM | POA: Diagnosis not present

## 2017-10-03 DIAGNOSIS — S31105A Unspecified open wound of abdominal wall, periumbilic region without penetration into peritoneal cavity, initial encounter: Secondary | ICD-10-CM | POA: Diagnosis not present

## 2017-10-04 ENCOUNTER — Encounter (INDEPENDENT_AMBULATORY_CARE_PROVIDER_SITE_OTHER): Payer: Self-pay | Admitting: Orthopedic Surgery

## 2017-10-04 ENCOUNTER — Ambulatory Visit (INDEPENDENT_AMBULATORY_CARE_PROVIDER_SITE_OTHER): Payer: Medicare Other | Admitting: Orthopedic Surgery

## 2017-10-04 VITALS — Ht 66.0 in | Wt 270.0 lb

## 2017-10-04 DIAGNOSIS — Z89512 Acquired absence of left leg below knee: Secondary | ICD-10-CM

## 2017-10-04 DIAGNOSIS — Z6841 Body Mass Index (BMI) 40.0 and over, adult: Secondary | ICD-10-CM

## 2017-10-04 DIAGNOSIS — IMO0002 Reserved for concepts with insufficient information to code with codable children: Secondary | ICD-10-CM

## 2017-10-04 DIAGNOSIS — T8781 Dehiscence of amputation stump: Secondary | ICD-10-CM

## 2017-10-04 NOTE — Progress Notes (Signed)
Office Visit Note   Patient: Julia West           Date of Birth: [birth year]           MRN: [id number] Visit Date: 10/04/2017              Requested by: [name], MD [place], [place] [address] PCP: [name], MD  Chief Complaint  Patient presents with  . Left Leg - Follow-up      HPI: Patient is a 62 year old woman status post revision left transtibial amputation who states she has a spot of bleeding or possible clot on the residual limb.  Assessment & Plan: Visit Diagnoses:  1. Dehiscence of amputation stump (HCC)   2. Below knee amputation status, left ([place])   3. Morbid obesity ([place])   4. Body mass index 40.0-44.9, adult [facility])     Plan: Patient will continue current care with Dial soap cleansing dry dressing changes daily with an Ace wrap.  Continue with nutrition supplements prescription was sent in for doxycycline.  Follow closely every week.  Follow-Up Instructions: Return in about 1 week (around 10/11/2017).   Ortho Exam  Patient is alert, oriented, no adenopathy, well-dressed, normal affect, normal respiratory effort. Examination patient has 2 small wounds on the residual limb wound medially and one laterally.  With gentle massage approximately 100 cc of hematoma was drained through these 2 wounds.  There is no clinical signs of infection there is no odor.  4 x 4's and an Ace wrap was applied.  Imaging: No results found. No images are attached to the encounter.  Labs: Lab Results  Component Value Date   HGBA1C 5.4 05/09/2017   HGBA1C 5.6 02/07/2017   ESRSEDRATE 84 (H) 09/05/2017   CRP 7.9 (H) 09/05/2017   REPTSTATUS 09/10/2017 FINAL 09/05/2017   GRAMSTAIN  04/24/2012    NO WBC SEEN NO SQUAMOUS EPITHELIAL CELLS SEEN MODERATE GRAM POSITIVE COCCI IN PAIRS RARE GRAM NEGATIVE RODS   GRAMSTAIN  04/24/2012    NO WBC SEEN NO SQUAMOUS EPITHELIAL CELLS SEEN MODERATE GRAM POSITIVE COCCI IN PAIRS RARE GRAM NEGATIVE RODS   CULT   09/05/2017    NO GROWTH 5 DAYS Performed at [facility], [place] [address], [place], [place] [address]    [place] 04/24/2012     Lab Results  Component Value Date   ALBUMIN 4.0 08/24/2017   ALBUMIN 4.6 12/10/2016   ALBUMIN 4.4 08/20/2015    Body mass index is 43.58 kg/m.  Orders:  No orders of the defined types were placed in this encounter.  No orders of the defined types were placed in this encounter.    Procedures: No procedures performed  Clinical Data: No additional findings.  ROS:  All other systems negative, except as noted in the HPI. Review of Systems  Objective: Vital Signs: Ht 5\' 6"  (1.676 m)   Wt 270 lb (122.5 kg)   BMI 43.58 kg/m   Specialty Comments:  No specialty comments available.  PMFS History: Patient Active Problem List   Diagnosis Date Noted  . Below knee amputation status, left ([place]) 08/31/2017  . Dehiscence of amputation stump ([place]) 08/29/2017  . Infection of amputation stump, left lower extremity ([place]) 06/19/2017  . Bradycardia 05/07/2017  . Depression 05/07/2017  . Lymphedema 05/07/2017  . Charcot foot due to diabetes mellitus ([place]) 02/09/2017  . 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 Post-menopausal bleeding 04/15/2014  .  Endometrial polyp 04/09/2014  . Ventral hernia 03/06/2012  . Open abdominal wall wound 03/06/2012  . Hyperlipemia 11/12/2008  . OBESITY, MORBID 11/12/2008  . Essential hypertension 11/12/2008  . CAD (coronary artery disease) 11/12/2008   Past Medical History:  Diagnosis Date  . Acute myocardial infarction    with a ruptured plaque in the circumflex in 2003  . Anxiety   . Cancer ([place])    basal cell face  . Complication of anesthesia    states low O2 sats post-op 11/13, always slow to awaken  . Coronary artery disease   . Degenerative joint disease   . 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 Dyspnea    with activity- car to house -  steps  . Heart murmur    "not to be concerned"  . HTN (hypertension)   . Hyperlipidemia LDL goal <70   . Internal and external hemorrhoids without complication   . Lymphedema    right side of body  . Lymphedema of arm    right  . Morbid obesity ([place])   . Surgical wound, non healing ABDOMINAL    has wound vac @ 125 mm Hg    Family History  Problem Relation Age of Onset  . Heart attack Mother   . Hypertension Sister   . Diabetes Brother   . Birth defects Son        heart defect    Past Surgical History:  Procedure Laterality Date  . AMPUTATION Left 05/06/2017   Procedure: BELOW KNEE AMPUTATION;  Surgeon: Newt Minion, MD;  Location: [place];  Service: Orthopedics;  Laterality: Left;  . APPLICATION OF WOUND VAC    . BOWEL RESECTION  02/07/2012   Procedure: SMALL BOWEL RESECTION;  Surgeon: Gayland Curry, MD,FACS;  Location: [place];  Service: General;;  . CARDIOVASCULAR STRESS TEST  01-17-2012  DR HOCHREIN   LOW RISK NUCLEAR TEST  . CESAREAN SECTION     x 4 in remote past  . CHOLECYSTECTOMY    . CORONARY ARTERY BYPASS GRAFT  2003   by Dr. Servando Snare. LIMA to the LAD, free RIMA to the circumflex. Stress perfusion study December 2009 with no high-risk areas of ischemia. She has a well-preserved ejection fraction  . HYSTEROSCOPY W/D&C N/A 05/14/2014   Procedure: DILATATION AND CURETTAGE (no specimen); HYSTEROSCOPY;  Surgeon: Jonnie Kind, MD;  [facility]: [facility];  Service: Gynecology;  Laterality: N/A;  . I&D EXTREMITY Left 06/21/2017   Procedure: IRRIGATION AND DEBRIDEMENT OF LEFT LEG AMPUTATION SITE;  Surgeon: Wallace Going, DO;  Location: [facility];  Service: Plastics;  Laterality: Left;  . INCISION AND DRAINAGE OF WOUND  04/17/2012   Procedure: IRRIGATION AND DEBRIDEMENT WOUND;  Surgeon: Theodoro Kos, DO;  Location: [place];  Service: Plastics;  Laterality: N/A;  OF ABDOMINAL WOUND, SURGICAL PREP AND PLACEMENT OF VAC, REMOVAL FOREHEAD SKIN LESION  . INCISION AND  DRAINAGE OF WOUND N/A 04/24/2012   Procedure: IRRIGATION AND DEBRIDEMENT WOUND;  Surgeon: Theodoro Kos, DO;  Location: [place];  Service: Plastics;  Laterality: N/A;  I & D ABDOMINAL WOUND WITH VAC AND ACELL  . INCISION AND DRAINAGE OF WOUND N/A 05/01/2012   Procedure: IRRIGATION AND DEBRIDEMENT WOUND;  Surgeon: Theodoro Kos, DO;  Location: [place];  Service: Plastics;  Laterality: N/A;  WITH SURGICAL PREP AND PLACEMENT OF VAC  . INCISION AND DRAINAGE OF WOUND N/A 05/08/2012   Procedure: IRRIGATION AND DEBRIDEMENT  OF ABD WOUND SURGICAL PREP AND PLACEMENT OF VAC ;  Surgeon: Theodoro Kos, DO;  Location: [place];  Service: Plastics;  Laterality: N/A;  IRRIGATION AND DEBRIDEMENT OF ABD WOUND SURGICAL PREP AND PLACEMENT OF VAC   . INCISION AND DRAINAGE OF WOUND N/A 05/15/2012   Procedure: IRRIGATION AND DEBRIDEMENT OF ABDOMINAL WOUND WITH POSSIBLE SURGICAL PREP AND PLACEMENT OF VAC;  Surgeon: Theodoro Kos, DO;  Location: [place];  Service: Plastics;  Laterality: N/A;  . INCISION AND DRAINAGE OF WOUND N/A 05/22/2012   Procedure: IRRIGATION AND DEBRIDEMENT OF ABODOMINAL WOUND WITH  SURGICAL PREP AND VAC PLACEMENT;  Surgeon: Theodoro Kos, DO;  Location: [place];  Service: Plastics;  Laterality: N/A;  . INCISION AND DRAINAGE OF WOUND N/A 06/28/2012   Procedure: IRRIGATION AND DEBRIDEMENT OF ABDOMINAL ULCER SURGICAL PREP AND PLACEMENT OF ACELL AND VAC;  Surgeon: Theodoro Kos, DO;  Location: [place];  Service: Plastics;  Laterality: N/A;  . INCISIONAL HERNIA REPAIR  02/07/2012   Procedure: HERNIA REPAIR INCISIONAL;  Surgeon: Gayland Curry, MD,FACS;  Location: [place];  Service: General;;  Open, Primary repair, strangulated Incisional hernia.  Marland Kitchen LESION REMOVAL  04/17/2012   Procedure: LESION REMOVAL;  Surgeon: Theodoro Kos, DO;  Location: [place];  Service: Plastics;  Laterality: N/A;    CENTER OF FOREHEAD, REMOVAL FORHEAD SKIN LESION  . ORIF TOE FRACTURE Left 02/09/2017   Procedure: EXCISION TALAR HEAD, INTERNAL FIXATION MEDIAL COLUMN LEFT FOOT;  Surgeon: Newt Minion, MD;  Location: [place];  Service: Orthopedics;  Laterality: Left;  . POLYPECTOMY N/A 05/14/2014   Procedure: ENDOMETRIAL POLYPECTOMY;  Surgeon: Jonnie Kind, MD;  [facility];  Service: Gynecology;  Laterality: N/A;  . STUMP REVISION Left 08/31/2017   Procedure: REVISION LEFT BELOW KNEE AMPUTATION;  Surgeon: Newt Minion, MD;  Location: [place];  Service: Orthopedics;  Laterality: Left;   Social History   Occupational History  . Occupation: Estate manager/land agent    Comment: In the [employer]  Tobacco Use  . Smoking status: Former Smoker    Packs/day: 1.00    Years: 25.00    Pack years: 25.00    Types: Cigarettes    Last attempt to quit: 11/17/2011    Years since quitting: 5.8  . Smokeless tobacco: Never Used  Substance and Sexual Activity  . Alcohol use: No  . Drug use: No  . Sexual activity: Yes    Birth control/protection: Post-menopausal

## 2017-10-05 ENCOUNTER — Telehealth (INDEPENDENT_AMBULATORY_CARE_PROVIDER_SITE_OTHER): Payer: Self-pay | Admitting: Orthopedic Surgery

## 2017-10-05 MED ORDER — DOXYCYCLINE HYCLATE 100 MG PO TABS: 100.0000 mg | ORAL_TABLET | Freq: Two times a day (BID) | ORAL | 0 refills | Status: DC

## 2017-10-05 NOTE — Telephone Encounter (Signed)
Rx sent to pharm now. IC pt and advised.

## 2017-10-05 NOTE — Telephone Encounter (Signed)
Patient called to state she was at visit yesterday and was told by Sharol Given he would send in antibiotic to [facility] [place]. It was never done and patient would like to get it called in. Doxycycline  Please call patient to advise [phone]

## 2017-10-06 DIAGNOSIS — S81009A Unspecified open wound, unspecified knee, initial encounter: Secondary | ICD-10-CM | POA: Diagnosis not present

## 2017-10-06 DIAGNOSIS — R2681 Unsteadiness on feet: Secondary | ICD-10-CM | POA: Diagnosis not present

## 2017-10-07 DIAGNOSIS — R2681 Unsteadiness on feet: Secondary | ICD-10-CM | POA: Diagnosis not present

## 2017-10-07 DIAGNOSIS — E11610 Type 2 diabetes mellitus with diabetic neuropathic arthropathy: Secondary | ICD-10-CM | POA: Diagnosis not present

## 2017-10-07 DIAGNOSIS — Z4789 Encounter for other orthopedic aftercare: Secondary | ICD-10-CM | POA: Diagnosis not present

## 2017-10-11 DIAGNOSIS — T8789 Other complications of amputation stump: Secondary | ICD-10-CM | POA: Diagnosis not present

## 2017-10-13 ENCOUNTER — Ambulatory Visit (INDEPENDENT_AMBULATORY_CARE_PROVIDER_SITE_OTHER): Payer: Medicare Other | Admitting: Orthopedic Surgery

## 2017-10-13 DIAGNOSIS — T8781 Dehiscence of amputation stump: Secondary | ICD-10-CM

## 2017-10-16 ENCOUNTER — Encounter (INDEPENDENT_AMBULATORY_CARE_PROVIDER_SITE_OTHER): Payer: Self-pay | Admitting: Orthopedic Surgery

## 2017-10-16 NOTE — Progress Notes (Signed)
Office Visit Note   Patient: Julia West           Date of Birth: 01-Aug-1955           MRN: [id number] Visit Date: 10/13/2017              Requested by: [name], MD [place], [name] [address] PCP: [name], MD  Chief Complaint  Patient presents with  . Left Leg - Follow-up      HPI: Patient is seen in follow-up status post revision left transtibial amputation last examination she had a large hematoma that was drained in the office.  Patient has completed her course of doxycycline.  Assessment & Plan: Visit Diagnoses:  1. Dehiscence of amputation stump ([place])     Plan: Patient will follow-up with biotech for evaluation for prosthetic fitting.  Continue with Dial soap cleansing continue with her stump shrinker.  Follow-Up Instructions: Return in about 1 month (around 11/10/2017).   Ortho Exam  Patient is alert, oriented, no adenopathy, well-dressed, normal affect, normal respiratory effort. Examination patient has 3 small open areas about 10 mm in diameter about 3 mm deep there is no hematoma there is no tenderness to palpation she has good granulation tissue.  Imaging: No results found. No images are attached to the encounter.  Labs: Lab Results  Component Value Date   HGBA1C 5.4 05/09/2017   HGBA1C 5.6 02/07/2017   ESRSEDRATE 84 (H) 09/05/2017   CRP 7.9 (H) 09/05/2017   REPTSTATUS 09/10/2017 FINAL 09/05/2017   GRAMSTAIN  04/24/2012    NO WBC SEEN NO SQUAMOUS EPITHELIAL CELLS SEEN MODERATE GRAM POSITIVE COCCI IN PAIRS RARE GRAM NEGATIVE RODS   GRAMSTAIN  04/24/2012    NO WBC SEEN NO SQUAMOUS EPITHELIAL CELLS SEEN MODERATE GRAM POSITIVE COCCI IN PAIRS RARE GRAM NEGATIVE RODS   CULT  09/05/2017    NO GROWTH 5 DAYS Performed at [facility], [place] [address] [place], [place] [address]    [place] 04/24/2012     Lab Results  Component Value Date   ALBUMIN 4.0 08/24/2017   ALBUMIN 4.6 12/10/2016   ALBUMIN 4.4 08/20/2015    There is no height or weight on file to calculate BMI.  Orders:  No orders of the defined types were placed in this encounter.  No orders of the defined types were placed in this encounter.    Procedures: No procedures performed  Clinical Data: No additional findings.  ROS:  All other systems negative, except as noted in the HPI. Review of Systems  Objective: Vital Signs: There were no vitals taken for this visit.  Specialty Comments:  No specialty comments available.  PMFS History: Patient Active Problem List   Diagnosis Date Noted  . Below knee amputation status, left ([place]) 08/31/2017  . Dehiscence of amputation stump ([place]) 08/29/2017  . Infection of amputation stump, left lower extremity ([place]) 06/19/2017  . Bradycardia 05/07/2017  . Depression 05/07/2017  . Lymphedema 05/07/2017  . Charcot foot due to diabetes mellitus ([place]) 02/09/2017  . 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 Post-menopausal bleeding 04/15/2014  . Endometrial polyp 04/09/2014  . Ventral hernia 03/06/2012  . Open abdominal wall wound 03/06/2012  . Hyperlipemia 11/12/2008  . OBESITY, MORBID 11/12/2008  . Essential hypertension 11/12/2008  . CAD (coronary artery disease) 11/12/2008   Past Medical History:  Diagnosis Date  . Acute myocardial infarction    with a ruptured  plaque in the circumflex in 2003  . Anxiety   . Cancer ([place])    basal cell face  . Complication of anesthesia    states low O2 sats post-op 11/13, always slow to awaken  . Coronary artery disease   . Degenerative joint disease   . 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 Dyspnea    with activity- car to house - steps  . Heart murmur    "not to be concerned"  . HTN (hypertension)   . Hyperlipidemia LDL goal <70   . Internal and external hemorrhoids without complication   . Lymphedema    right side of body  . Lymphedema of arm    right  . Morbid  obesity ([place])   . Surgical wound, non healing ABDOMINAL    has wound vac @ 125 mm Hg    Family History  Problem Relation Age of Onset  . Heart attack Mother   . Hypertension Sister   . Diabetes Brother   . Birth defects Son        heart defect    Past Surgical History:  Procedure Laterality Date  . AMPUTATION Left 05/06/2017   Procedure: BELOW KNEE AMPUTATION;  Surgeon: Newt Minion, MD;  Location: [place];  Service: Orthopedics;  Laterality: Left;  . APPLICATION OF WOUND VAC    . BOWEL RESECTION  02/07/2012   Procedure: SMALL BOWEL RESECTION;  Surgeon: Gayland Curry, MD,FACS;  Location: [place];  Service: General;;  . CARDIOVASCULAR STRESS TEST  01-17-2012  DR HOCHREIN   LOW RISK NUCLEAR TEST  . CESAREAN SECTION     x 4 in remote past  . CHOLECYSTECTOMY    . CORONARY ARTERY BYPASS GRAFT  2003   by Dr. Servando Snare. LIMA to the LAD, free RIMA to the circumflex. Stress perfusion study December 2009 with no high-risk areas of ischemia. She has a well-preserved ejection fraction  . HYSTEROSCOPY W/D&C N/A 05/14/2014   Procedure: DILATATION AND CURETTAGE (no specimen); HYSTEROSCOPY;  Surgeon: Jonnie Kind, MD;  Location[facility];  Service: Gynecology;  Laterality: N/A;  . I&D EXTREMITY Left 06/21/2017   Procedure: IRRIGATION AND DEBRIDEMENT OF LEFT LEG AMPUTATION SITE;  Surgeon: Wallace Going, DO;  [facility];  Service: Plastics;  Laterality: Left;  . INCISION AND DRAINAGE OF WOUND  04/17/2012   Procedure: IRRIGATION AND DEBRIDEMENT WOUND;  Surgeon: Theodoro Kos, DO;  Location: [place];  Service: Plastics;  Laterality: N/A;  OF ABDOMINAL WOUND, SURGICAL PREP AND PLACEMENT OF VAC, REMOVAL FOREHEAD SKIN LESION  . INCISION AND DRAINAGE OF WOUND N/A 04/24/2012   Procedure: IRRIGATION AND DEBRIDEMENT WOUND;  Surgeon: Theodoro Kos, DO;  Location: [place];  Service: Plastics;  Laterality: N/A;  I & D ABDOMINAL WOUND WITH VAC AND ACELL  . INCISION AND  DRAINAGE OF WOUND N/A 05/01/2012   Procedure: IRRIGATION AND DEBRIDEMENT WOUND;  Surgeon: Theodoro Kos, DO;  Location: [place];  Service: Plastics;  Laterality: N/A;  WITH SURGICAL PREP AND PLACEMENT OF VAC  . INCISION AND DRAINAGE OF WOUND N/A 05/08/2012   Procedure: IRRIGATION AND DEBRIDEMENT OF ABD WOUND SURGICAL PREP AND PLACEMENT OF VAC ;  Surgeon: Theodoro Kos, DO;  Location: [place];  Service: Plastics;  Laterality: N/A;  IRRIGATION AND DEBRIDEMENT OF ABD WOUND SURGICAL PREP AND PLACEMENT OF VAC   . INCISION AND DRAINAGE OF WOUND N/A 05/15/2012   Procedure: IRRIGATION AND  DEBRIDEMENT OF ABDOMINAL WOUND WITH POSSIBLE SURGICAL PREP AND PLACEMENT OF VAC;  Surgeon: Theodoro Kos, DO;  Location: [facility];  Service: Plastics;  Laterality: N/A;  . INCISION AND DRAINAGE OF WOUND N/A 05/22/2012   Procedure: IRRIGATION AND DEBRIDEMENT OF ABODOMINAL WOUND WITH  SURGICAL PREP AND VAC PLACEMENT;  Surgeon: Theodoro Kos, DO;  Location: [facility];  Service: Plastics;  Laterality: N/A;  . INCISION AND DRAINAGE OF WOUND N/A 06/28/2012   Procedure: IRRIGATION AND DEBRIDEMENT OF ABDOMINAL ULCER SURGICAL PREP AND PLACEMENT OF ACELL AND VAC;  Surgeon: Theodoro Kos, DO;  Location: [facility];  Service: Plastics;  Laterality: N/A;  . INCISIONAL HERNIA REPAIR  02/07/2012   Procedure: HERNIA REPAIR INCISIONAL;  Surgeon: Gayland Curry, MD,FACS;  Location: [facility];  Service: General;;  Open, Primary repair, strangulated Incisional hernia.  Marland Kitchen LESION REMOVAL  04/17/2012   Procedure: LESION REMOVAL;  Surgeon: Theodoro Kos, DO;  Location: [place];  Service: Plastics;  Laterality: N/A;   CENTER OF FOREHEAD, REMOVAL FORHEAD SKIN LESION  . ORIF TOE FRACTURE Left 02/09/2017   Procedure: EXCISION TALAR HEAD, INTERNAL FIXATION MEDIAL COLUMN LEFT FOOT;  Surgeon: Newt Minion, MD;  Location: [place];  Service: Orthopedics;   Laterality: Left;  . POLYPECTOMY N/A 05/14/2014   Procedure: ENDOMETRIAL POLYPECTOMY;  Surgeon: Jonnie Kind, MD;  [facility];  Service: Gynecology;  Laterality: N/A;  . STUMP REVISION Left 08/31/2017   Procedure: REVISION LEFT BELOW KNEE AMPUTATION;  Surgeon: Newt Minion, MD;  Location: [facility];  Service: Orthopedics;  Laterality: Left;   Social History   Occupational History  . Occupation: Estate manager/land agent    Comment: In the [employer]  Tobacco Use  . Smoking status: Former Smoker    Packs/day: 1.00    Years: 25.00    Pack years: 25.00    Types: Cigarettes    Last attempt to quit: 11/17/2011    Years since quitting: 5.9  . Smokeless tobacco: Never Used  Substance and Sexual Activity  . Alcohol use: No  . Drug use: No  . Sexual activity: Yes    Birth control/protection: Post-menopausal

## 2017-10-25 ENCOUNTER — Telehealth (INDEPENDENT_AMBULATORY_CARE_PROVIDER_SITE_OTHER): Payer: Self-pay

## 2017-10-25 NOTE — Telephone Encounter (Signed)
Voicemail left on triage phone. Wound looks good and is closing. "only have pin hole left." Seems to be blisters where sutures were. Wants to know if this is normal or if she needs to have it looked at?

## 2017-10-26 ENCOUNTER — Ambulatory Visit (INDEPENDENT_AMBULATORY_CARE_PROVIDER_SITE_OTHER): Payer: Medicare Other | Admitting: Family

## 2017-10-26 ENCOUNTER — Encounter (INDEPENDENT_AMBULATORY_CARE_PROVIDER_SITE_OTHER): Payer: Self-pay | Admitting: Family

## 2017-10-26 ENCOUNTER — Telehealth (INDEPENDENT_AMBULATORY_CARE_PROVIDER_SITE_OTHER): Payer: Self-pay | Admitting: Radiology

## 2017-10-26 DIAGNOSIS — Z89512 Acquired absence of left leg below knee: Secondary | ICD-10-CM

## 2017-10-26 DIAGNOSIS — IMO0002 Reserved for concepts with insufficient information to code with codable children: Secondary | ICD-10-CM

## 2017-10-26 NOTE — Telephone Encounter (Signed)
Can you please open a spot at 12:15 for this pt to come see Junie Panning? I have already called her to advise.

## 2017-10-26 NOTE — Telephone Encounter (Signed)
Patient left a voicemail on the triage line this morning stating that when her dressing was changed, they noticed blisters around the sutures and she continues to have some drainage there. She does not know if this is something that she should be concerned about. Patient requests return call to advise.  [phone]

## 2017-10-26 NOTE — Progress Notes (Signed)
Office Visit Note   Patient: Julia [place]           Date of Birth: 1955-10-07           MRN: [id number] Visit Date: 10/26/2017              Requested by: [name], MD [place], [place] [address] PCP: [name], MD  No chief complaint on file.     HPI: Patient is seen in follow-up status post revision left transtibial amputation.  Patient presents today as a working concern for blistering to her residual limb.  Assessment & Plan: Visit Diagnoses:  1. Below knee amputation status, left (HCC)     Plan: Dry dressing changes daily.  Reassurance provided.  Patient will follow-up with biotech for evaluation for prosthetic fitting.  Continue with Dial soap cleansing continue with her stump shrinker.  Follow-Up Instructions: Return for as scheduled.   Ortho Exam  Patient is alert, oriented, no adenopathy, well-dressed, normal affect, normal respiratory effort. Examination patient has 3 small open areas about 3 mm in diameter about 1 mm deep there is no hematoma there is no tenderness to palpation she has good granulation tissue.  Imaging: No results found. No images are attached to the encounter.  Labs: Lab Results  Component Value Date   HGBA1C 5.4 05/09/2017   HGBA1C 5.6 02/07/2017   ESRSEDRATE 84 (H) 09/05/2017   CRP 7.9 (H) 09/05/2017   REPTSTATUS 09/10/2017 FINAL 09/05/2017   GRAMSTAIN  04/24/2012    NO WBC SEEN NO SQUAMOUS EPITHELIAL CELLS SEEN MODERATE GRAM POSITIVE COCCI IN PAIRS RARE GRAM NEGATIVE RODS   GRAMSTAIN  04/24/2012    NO WBC SEEN NO SQUAMOUS EPITHELIAL CELLS SEEN MODERATE GRAM POSITIVE COCCI IN PAIRS RARE GRAM NEGATIVE RODS   CULT  09/05/2017    NO GROWTH 5 DAYS Performed at [facility], [place] [address], [place], [place] [address]    [place] 04/24/2012     Lab Results  Component Value Date   ALBUMIN 4.0 08/24/2017   ALBUMIN 4.6 12/10/2016   ALBUMIN 4.4 08/20/2015    There is no height  or weight on file to calculate BMI.  Orders:  No orders of the defined types were placed in this encounter.  No orders of the defined types were placed in this encounter.    Procedures: No procedures performed  Clinical Data: No additional findings.  ROS:  All other systems negative, except as noted in the HPI. Review of Systems  Constitutional: Negative for chills and fever.    Objective: Vital Signs: There were no vitals taken for this visit.  Specialty Comments:  No specialty comments available.  PMFS History: Patient Active Problem List   Diagnosis Date Noted  . Below knee amputation status, left ([place]) 08/31/2017  . Dehiscence of amputation stump ([place]) 08/29/2017  . Infection of amputation stump, left lower extremity ([place]) 06/19/2017  . Bradycardia 05/07/2017  . Depression 05/07/2017  . Lymphedema 05/07/2017  . Charcot foot due to diabetes mellitus ([place]) 02/09/2017  . 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 Post-menopausal bleeding 04/15/2014  . Endometrial polyp 04/09/2014  . Ventral hernia 03/06/2012  . Open abdominal wall wound 03/06/2012  . Hyperlipemia 11/12/2008  . OBESITY, MORBID 11/12/2008  . Essential hypertension 11/12/2008  . CAD (coronary artery disease) 11/12/2008   Past Medical History:  Diagnosis Date  . Acute myocardial infarction    with a  ruptured plaque in the circumflex in 2003  . Anxiety   . Cancer ([place])    basal cell face  . Complication of anesthesia    states low O2 sats post-op 11/13, always slow to awaken  . Coronary artery disease   . Degenerative joint disease   . 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 Dyspnea    with activity- car to house - steps  . Heart murmur    "not to be concerned"  . HTN (hypertension)   . Hyperlipidemia LDL goal <70   . Internal and external hemorrhoids without complication   . Lymphedema    right side of body  . Lymphedema of arm    right  .  Morbid obesity ([place])   . Surgical wound, non healing ABDOMINAL    has wound vac @ 125 mm Hg    Family History  Problem Relation Age of Onset  . Heart attack Mother   . Hypertension Sister   . Diabetes Brother   . Birth defects Son        heart defect    Past Surgical History:  Procedure Laterality Date  . AMPUTATION Left 05/06/2017   Procedure: BELOW KNEE AMPUTATION;  Surgeon: Newt Minion, MD;  Location: [facility];  Service: Orthopedics;  Laterality: Left;  . APPLICATION OF WOUND VAC    . BOWEL RESECTION  02/07/2012   Procedure: SMALL BOWEL RESECTION;  Surgeon: Gayland Curry, MD,FACS;  Location: [place] [facility];  Service: General;;  . CARDIOVASCULAR STRESS TEST  01-17-2012  DR HOCHREIN   LOW RISK NUCLEAR TEST  . CESAREAN SECTION     x 4 in remote past  . CHOLECYSTECTOMY    . CORONARY ARTERY BYPASS GRAFT  2003   by Dr. Servando Snare. LIMA to the LAD, free RIMA to the circumflex. Stress perfusion study December 2009 with no high-risk areas of ischemia. She has a well-preserved ejection fraction  . HYSTEROSCOPY W/D&C N/A 05/14/2014   Procedure: DILATATION AND CURETTAGE (no specimen); HYSTEROSCOPY;  Surgeon: Jonnie Kind, MD;  [facility]: [facility];  Service: Gynecology;  Laterality: N/A;  . I&D EXTREMITY Left 06/21/2017   Procedure: IRRIGATION AND DEBRIDEMENT OF LEFT LEG AMPUTATION SITE;  Surgeon: Wallace Going, DO;  [facility]: [facility];  Service: Plastics;  Laterality: Left;  . INCISION AND DRAINAGE OF WOUND  04/17/2012   Procedure: IRRIGATION AND DEBRIDEMENT WOUND;  Surgeon: Theodoro Kos, DO;  Location: [facility];  Service: Plastics;  Laterality: N/A;  OF ABDOMINAL WOUND, SURGICAL PREP AND PLACEMENT OF VAC, REMOVAL FOREHEAD SKIN LESION  . INCISION AND DRAINAGE OF WOUND N/A 04/24/2012   Procedure: IRRIGATION AND DEBRIDEMENT WOUND;  Surgeon: Theodoro Kos, DO;  Location: [facility];  Service: Plastics;  Laterality: N/A;  I & D ABDOMINAL WOUND WITH VAC AND ACELL  . INCISION  AND DRAINAGE OF WOUND N/A 05/01/2012   Procedure: IRRIGATION AND DEBRIDEMENT WOUND;  Surgeon: Theodoro Kos, DO;  Location: [facility];  Service: Plastics;  Laterality: N/A;  WITH SURGICAL PREP AND PLACEMENT OF VAC  . INCISION AND DRAINAGE OF WOUND N/A 05/08/2012   Procedure: IRRIGATION AND DEBRIDEMENT OF ABD WOUND SURGICAL PREP AND PLACEMENT OF VAC ;  Surgeon: Theodoro Kos, DO;  Location: [place] [facility];  Service: Plastics;  Laterality: N/A;  IRRIGATION AND DEBRIDEMENT OF ABD WOUND SURGICAL PREP AND PLACEMENT OF VAC   . INCISION AND DRAINAGE OF WOUND N/A 05/15/2012   Procedure: IRRIGATION  AND DEBRIDEMENT OF ABDOMINAL WOUND WITH POSSIBLE SURGICAL PREP AND PLACEMENT OF VAC;  Surgeon: Theodoro Kos, DO;  Location: [facility];  Service: Plastics;  Laterality: N/A;  . INCISION AND DRAINAGE OF WOUND N/A 05/22/2012   Procedure: IRRIGATION AND DEBRIDEMENT OF ABODOMINAL WOUND WITH  SURGICAL PREP AND VAC PLACEMENT;  Surgeon: Theodoro Kos, DO;  Location: [facility];  Service: Plastics;  Laterality: N/A;  . INCISION AND DRAINAGE OF WOUND N/A 06/28/2012   Procedure: IRRIGATION AND DEBRIDEMENT OF ABDOMINAL ULCER SURGICAL PREP AND PLACEMENT OF ACELL AND VAC;  Surgeon: Theodoro Kos, DO;  Location: [facility];  Service: Plastics;  Laterality: N/A;  . INCISIONAL HERNIA REPAIR  02/07/2012   Procedure: HERNIA REPAIR INCISIONAL;  Surgeon: Gayland Curry, MD,FACS;  Location: [facility];  Service: General;;  Open, Primary repair, strangulated Incisional hernia.  Marland Kitchen LESION REMOVAL  04/17/2012   Procedure: LESION REMOVAL;  Surgeon: Theodoro Kos, DO;  Location: [facility];  Service: Plastics;  Laterality: N/A;   CENTER OF FOREHEAD, REMOVAL FORHEAD SKIN LESION  . ORIF TOE FRACTURE Left 02/09/2017   Procedure: EXCISION TALAR HEAD, INTERNAL FIXATION MEDIAL COLUMN LEFT FOOT;  Surgeon: Newt Minion, MD;  Location: [facility];  Service: Orthopedics;   Laterality: Left;  . POLYPECTOMY N/A 05/14/2014   Procedure: ENDOMETRIAL POLYPECTOMY;  Surgeon: Jonnie Kind, MD;  Location: [facility];  Service: Gynecology;  Laterality: N/A;  . STUMP REVISION Left 08/31/2017   Procedure: REVISION LEFT BELOW KNEE AMPUTATION;  Surgeon: Newt Minion, MD;  Location: [facility];  Service: Orthopedics;  Laterality: Left;   Social History   Occupational History  . Occupation: Estate manager/land agent    Comment: In the [employer]  Tobacco Use  . Smoking status: Former Smoker    Packs/day: 1.00    Years: 25.00    Pack years: 25.00    Types: Cigarettes    Last attempt to quit: 11/17/2011    Years since quitting: 5.9  . Smokeless tobacco: Never Used  Substance and Sexual Activity  . Alcohol use: No  . Drug use: No  . Sexual activity: Yes    Birth control/protection: Post-menopausal

## 2017-10-26 NOTE — Telephone Encounter (Signed)
appt today at 12:15

## 2017-10-30 ENCOUNTER — Other Ambulatory Visit: Payer: Self-pay | Admitting: Family Medicine

## 2017-11-02 ENCOUNTER — Other Ambulatory Visit: Payer: Self-pay | Admitting: Licensed Clinical Social Worker

## 2017-11-02 NOTE — Patient Outreach (Signed)
Assessment:  CSW spoke via phone with client. CSW verified client identity. CSW received verbal permission from client for CSW to speak with client about client needs. CSW and client spoke of client care plan. CSW encouraged client to communicate with CSW in next 30 days to discuss community resources of assistance to client. CSW.  talked with client about community resources such as food pantry help in area. CSW talked with client about assistance with in home care support through local agency. Client spoke of her appointments with Dr. Sharol Given. She spoke of the healing of her surgery site. She said she has return appointment scheduled with Dr. Sharol Given on 11/10/17. She said she also has an appointment on 11/10/17 with [employer] representative related to prosthesis fitting.  She said she has supportive family. She has a [name] that she uses to travel to and from her medical appointments. Her family helps her with food procurement and meal support. Her spouse helps her with activities of daily living as needed.  CSW informed [place] of [facility] nursing services support available.  CSW talked with her about [facility] pharmacy services.  CSW thanked [place] for phone call with CSW on 11/02/17. CSW encouraged [place] to call CSW as needed to discuss social work needs of client.    Plan:  Client to communicate with CSW in next 30 days to discuss community resources of help to client.   CSW to call client in 3 weeks to assess client needs.  Norva Riffle.Chastidy Ranker MSW, LCSW Licensed Clinical Social Worker [facility] Care Management [phone]

## 2017-11-06 DIAGNOSIS — R2681 Unsteadiness on feet: Secondary | ICD-10-CM | POA: Diagnosis not present

## 2017-11-06 DIAGNOSIS — S81009A Unspecified open wound, unspecified knee, initial encounter: Secondary | ICD-10-CM | POA: Diagnosis not present

## 2017-11-07 DIAGNOSIS — R2681 Unsteadiness on feet: Secondary | ICD-10-CM | POA: Diagnosis not present

## 2017-11-07 DIAGNOSIS — E11610 Type 2 diabetes mellitus with diabetic neuropathic arthropathy: Secondary | ICD-10-CM | POA: Diagnosis not present

## 2017-11-07 DIAGNOSIS — Z4789 Encounter for other orthopedic aftercare: Secondary | ICD-10-CM | POA: Diagnosis not present

## 2017-11-10 ENCOUNTER — Ambulatory Visit (INDEPENDENT_AMBULATORY_CARE_PROVIDER_SITE_OTHER): Payer: Medicare Other | Admitting: Physician Assistant

## 2017-11-10 ENCOUNTER — Encounter (INDEPENDENT_AMBULATORY_CARE_PROVIDER_SITE_OTHER): Payer: Self-pay | Admitting: Orthopedic Surgery

## 2017-11-10 VITALS — Ht 66.0 in | Wt 270.0 lb

## 2017-11-10 DIAGNOSIS — Z89512 Acquired absence of left leg below knee: Secondary | ICD-10-CM

## 2017-11-11 ENCOUNTER — Encounter (INDEPENDENT_AMBULATORY_CARE_PROVIDER_SITE_OTHER): Payer: Self-pay | Admitting: Physician Assistant

## 2017-11-11 NOTE — Progress Notes (Signed)
Office Visit Note   Patient: Julia West           Date of Birth: Dec 31, 1955           MRN: [id number] Visit Date: 11/10/2017              Requested by: [name], MD [place], [place] [address] PCP: [name], MD   Assessment & Plan: Visit Diagnoses:  1. Acquired absence of left lower extremity below knee ([place])     Plan: Patient should begin some hydrocortisone cream 0.1% to the rash/dermatitis of the stump twice daily.  She can cover the area with gauze ABD pad for drainage and then Ace wrapping.  She should continue with stump shrinker over the Ace wrap being as well.Marland Kitchen  She will follow-up with the orthotist later today for prosthesis.  She will follow-up here in 4 weeks or sooner should she have difficulty in the interim.  Follow-Up Instructions: Return in about 4 weeks (around 12/08/2017).   Orders:  No orders of the defined types were placed in this encounter.  No orders of the defined types were placed in this encounter.     Procedures: No procedures performed   Clinical Data: No additional findings.   Subjective: Chief Complaint  Patient presents with  . Left Leg - Follow-up    HPI Patient is a 62 year old female who is seen for postoperative follow-up following left transtibial amputation.  Her original transtibial amputation was on 05/06/2017 and then she underwent revision of the left transtibial amputation on 08/31/2017 due to macerated tissue and drainage with difficulty with wound healing but no signs of persistent infection.  She has been doing well over the past month and reports that she has no pain but she has developed some small blisters that are draining a small amount of honey colored fluid.  She is utilizing a stump shrinker.  She is following up with [employer] I believe to work on getting a prosthesis. Review of Systems   Objective: Vital Signs: Ht 5\' 6"  (1.676 m)   Wt 270 lb (122.5 kg)   BMI 43.58 kg/m   Physical  Exam Patient is centrally obese female who presents at the wheelchair level.  She is in no distress.  She is alert oriented and appropriate. Ortho Exam Examination of the left below the knee amputation shows some dermatitis changes of the distal and inferior flap with some small blisters which are draining some scant serous appearing drainage.  This is consistent with dermatitis.  There is no evidence for gross cellulitis. Specialty Comments:  No specialty comments available.  Imaging: No results found.   PMFS History: Patient Active Problem List   Diagnosis Date Noted  . Below knee amputation status, left ([place]) 08/31/2017  . Dehiscence of amputation stump ([place]) 08/29/2017  . Infection of amputation stump, left lower extremity ([place]) 06/19/2017  . Bradycardia 05/07/2017  . Depression 05/07/2017  . Lymphedema 05/07/2017  . Charcot foot due to diabetes mellitus ([place]) 02/09/2017  . 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 Post-menopausal bleeding 04/15/2014  . Endometrial polyp 04/09/2014  . Ventral hernia 03/06/2012  . Open abdominal wall wound 03/06/2012  . Hyperlipemia 11/12/2008  . OBESITY, MORBID 11/12/2008  . Essential hypertension 11/12/2008  . CAD (coronary artery disease) 11/12/2008   Past Medical History:  Diagnosis Date  . Acute myocardial infarction    with a ruptured plaque in the circumflex in 2003  .  Anxiety   . Cancer ([place])    basal cell face  . Complication of anesthesia    states low O2 sats post-op 11/13, always slow to awaken  . Coronary artery disease   . Degenerative joint disease   . 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 Dyspnea    with activity- car to house - steps  . Heart murmur    "not to be concerned"  . HTN (hypertension)   . Hyperlipidemia LDL goal <70   . Internal and external hemorrhoids without complication   . Lymphedema    right side of body  . Lymphedema of arm    right  . Morbid obesity  ([place])   . Surgical wound, non healing ABDOMINAL    has wound vac @ 125 mm Hg    Family History  Problem Relation Age of Onset  . Heart attack Mother   . Hypertension Sister   . Diabetes Brother   . Birth defects Son        heart defect    Past Surgical History:  Procedure Laterality Date  . AMPUTATION Left 05/06/2017   Procedure: BELOW KNEE AMPUTATION;  Surgeon: Newt Minion, MD;  Location: [facility];  Service: Orthopedics;  Laterality: Left;  . APPLICATION OF WOUND VAC    . BOWEL RESECTION  02/07/2012   Procedure: SMALL BOWEL RESECTION;  Surgeon: Gayland Curry, MD,FACS;  Location: [facility];  Service: General;;  . CARDIOVASCULAR STRESS TEST  01-17-2012  DR HOCHREIN   LOW RISK NUCLEAR TEST  . CESAREAN SECTION     x 4 in remote past  . CHOLECYSTECTOMY    . CORONARY ARTERY BYPASS GRAFT  2003   by Dr. Servando Snare. LIMA to the LAD, free RIMA to the circumflex. Stress perfusion study December 2009 with no high-risk areas of ischemia. She has a well-preserved ejection fraction  . HYSTEROSCOPY W/D&C N/A 05/14/2014   Procedure: DILATATION AND CURETTAGE (no specimen); HYSTEROSCOPY;  Surgeon: Jonnie Kind, MD;  Location[facility];  Service: Gynecology;  Laterality: N/A;  . I&D EXTREMITY Left 06/21/2017   Procedure: IRRIGATION AND DEBRIDEMENT OF LEFT LEG AMPUTATION SITE;  Surgeon: Wallace Going, DO;  [facility];  Service: Plastics;  Laterality: Left;  . INCISION AND DRAINAGE OF WOUND  04/17/2012   Procedure: IRRIGATION AND DEBRIDEMENT WOUND;  Surgeon: Theodoro Kos, DO;  Location: [facility];  Service: Plastics;  Laterality: N/A;  OF ABDOMINAL WOUND, SURGICAL PREP AND PLACEMENT OF VAC, REMOVAL FOREHEAD SKIN LESION  . INCISION AND DRAINAGE OF WOUND N/A 04/24/2012   Procedure: IRRIGATION AND DEBRIDEMENT WOUND;  Surgeon: Theodoro Kos, DO;  Location: [facility];  Service: Plastics;  Laterality: N/A;  I & D ABDOMINAL WOUND WITH VAC AND ACELL  . INCISION AND DRAINAGE OF  WOUND N/A 05/01/2012   Procedure: IRRIGATION AND DEBRIDEMENT WOUND;  Surgeon: Theodoro Kos, DO;  Location: [place];  Service: Plastics;  Laterality: N/A;  WITH SURGICAL PREP AND PLACEMENT OF VAC  . INCISION AND DRAINAGE OF WOUND N/A 05/08/2012   Procedure: IRRIGATION AND DEBRIDEMENT OF ABD WOUND SURGICAL PREP AND PLACEMENT OF VAC ;  Surgeon: Theodoro Kos, DO;  Location: [facility];  Service: Plastics;  Laterality: N/A;  IRRIGATION AND DEBRIDEMENT OF ABD WOUND SURGICAL PREP AND PLACEMENT OF VAC   . INCISION AND DRAINAGE OF WOUND N/A 05/15/2012   Procedure: IRRIGATION AND DEBRIDEMENT OF ABDOMINAL WOUND WITH POSSIBLE SURGICAL PREP  AND PLACEMENT OF VAC;  Surgeon: Theodoro Kos, DO;  Location: [facility];  Service: Plastics;  Laterality: N/A;  . INCISION AND DRAINAGE OF WOUND N/A 05/22/2012   Procedure: IRRIGATION AND DEBRIDEMENT OF ABODOMINAL WOUND WITH  SURGICAL PREP AND VAC PLACEMENT;  Surgeon: Theodoro Kos, DO;  Location: [facility];  Service: Plastics;  Laterality: N/A;  . INCISION AND DRAINAGE OF WOUND N/A 06/28/2012   Procedure: IRRIGATION AND DEBRIDEMENT OF ABDOMINAL ULCER SURGICAL PREP AND PLACEMENT OF ACELL AND VAC;  Surgeon: Theodoro Kos, DO;  Location: [facility];  Service: Plastics;  Laterality: N/A;  . INCISIONAL HERNIA REPAIR  02/07/2012   Procedure: HERNIA REPAIR INCISIONAL;  Surgeon: Gayland Curry, MD,FACS;  Location: [facility];  Service: General;;  Open, Primary repair, strangulated Incisional hernia.  Marland Kitchen LESION REMOVAL  04/17/2012   Procedure: LESION REMOVAL;  Surgeon: Theodoro Kos, DO;  Location: [facility];  Service: Plastics;  Laterality: N/A;   CENTER OF FOREHEAD, REMOVAL FORHEAD SKIN LESION  . ORIF TOE FRACTURE Left 02/09/2017   Procedure: EXCISION TALAR HEAD, INTERNAL FIXATION MEDIAL COLUMN LEFT FOOT;  Surgeon: Newt Minion, MD;  Location: [facility];  Service: Orthopedics;  Laterality: Left;   . POLYPECTOMY N/A 05/14/2014   Procedure: ENDOMETRIAL POLYPECTOMY;  Surgeon: Jonnie Kind, MD;  Location[facility];  Service: Gynecology;  Laterality: N/A;  . STUMP REVISION Left 08/31/2017   Procedure: REVISION LEFT BELOW KNEE AMPUTATION;  Surgeon: Newt Minion, MD;  Location: [facility];  Service: Orthopedics;  Laterality: Left;   Social History   Occupational History  . Occupation: Estate manager/land agent    Comment: In the [employer]  Tobacco Use  . Smoking status: Former Smoker    Packs/day: 1.00    Years: 25.00    Pack years: 25.00    Types: Cigarettes    Last attempt to quit: 11/17/2011    Years since quitting: 5.9  . Smokeless tobacco: Never Used  Substance and Sexual Activity  . Alcohol use: No  . Drug use: No  . Sexual activity: Yes    Birth control/protection: Post-menopausal

## 2017-11-21 ENCOUNTER — Encounter: Payer: Self-pay | Admitting: Cardiology

## 2017-11-21 DIAGNOSIS — T8189XA Other complications of procedures, not elsewhere classified, initial encounter: Secondary | ICD-10-CM | POA: Diagnosis not present

## 2017-11-21 DIAGNOSIS — S31105A Unspecified open wound of abdominal wall, periumbilic region without penetration into peritoneal cavity, initial encounter: Secondary | ICD-10-CM | POA: Diagnosis not present

## 2017-11-21 DIAGNOSIS — Z89512 Acquired absence of left leg below knee: Secondary | ICD-10-CM | POA: Diagnosis not present

## 2017-11-21 NOTE — Progress Notes (Signed)
Cardiology Office Note   Date:  11/23/2017   ID:  Julia West, DOB Oct 10, 1955, MRN [id number]  PCP:  Julia Fraise, MD  Cardiologist:   No primary care provider on file.  Chief Complaint  Patient presents with  . Coronary Artery Disease      History of Present Illness: Julia West is a 62 y.o. female who presented for evaluation of CAD.  Since we last saw her she has had multiple surgeries to treat Charcot joint on the left foot.  Interestingly she does not have a diagnosis of diabetes.  Unfortunately she had nonhealing wounds and in February had an amputation.  I did review those records and we saw her briefly during that hospitalization because of pain medication she developed bradycardia but this was quickly resolved.  She has completed some rehab.  She is awaiting a prosthesis and gets around in a wheelchair right now.  She denies any cardiovascular symptoms.  She denies any palpitations, presyncope or syncope.  She said no chest pressure, neck or arm discomfort.  She is had about 100 pound weight loss over about a year.  She is able to transfer and stand briefly with a walker and again will be transitioning to a prosthesis.   Past Medical History:  Diagnosis Date  . Acute myocardial infarction    with a ruptured plaque in the circumflex in 2003  . Anxiety   . Cancer ([facility])    basal cell face  . Complication of anesthesia    states low O2 sats post-op 11/13, always slow to awaken  . Degenerative joint disease   . 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 Dyspnea    with activity- car to house - steps  . HTN (hypertension)   . Hyperlipidemia LDL goal <70   . Internal and external hemorrhoids without complication   . Lymphedema    right side of body  . Lymphedema of arm    right  . Morbid obesity ([place])   . Surgical wound, non healing ABDOMINAL    has wound vac @ 125 mm Hg    Past Surgical History:  Procedure Laterality Date  . AMPUTATION  Left 05/06/2017   Procedure: BELOW KNEE AMPUTATION;  Surgeon: Newt Minion, MD;  Location: [facility];  Service: Orthopedics;  Laterality: Left;  . APPLICATION OF WOUND VAC    . BOWEL RESECTION  02/07/2012   Procedure: SMALL BOWEL RESECTION;  Surgeon: Gayland Curry, MD,FACS;  Location: [facility];  Service: General;;  . CARDIOVASCULAR STRESS TEST  01-17-2012  DR Jaryd Drew   LOW RISK NUCLEAR TEST  . CESAREAN SECTION     x 4 in remote past  . CHOLECYSTECTOMY    . CORONARY ARTERY BYPASS GRAFT  2003   by Dr. Servando Snare. LIMA to the LAD, free RIMA to the circumflex. Stress perfusion study December 2009 with no high-risk areas of ischemia. She has a well-preserved ejection fraction  . HYSTEROSCOPY W/D&C N/A 05/14/2014   Procedure: DILATATION AND CURETTAGE (no specimen); HYSTEROSCOPY;  Surgeon: Jonnie Kind, MD;  [facility];  Service: Gynecology;  Laterality: N/A;  . I&D EXTREMITY Left 06/21/2017   Procedure: IRRIGATION AND DEBRIDEMENT OF LEFT LEG AMPUTATION SITE;  Surgeon: Wallace Going, DO;  [facility];  Service: Plastics;  Laterality: Left;  . INCISION AND DRAINAGE OF WOUND  04/17/2012   Procedure: IRRIGATION AND DEBRIDEMENT WOUND;  Surgeon: Theodoro Kos, DO;  [facility];  Service: Plastics;  Laterality: N/A;  OF ABDOMINAL WOUND, SURGICAL PREP AND PLACEMENT OF VAC, REMOVAL FOREHEAD SKIN LESION  . INCISION AND DRAINAGE OF WOUND N/A 04/24/2012   Procedure: IRRIGATION AND DEBRIDEMENT WOUND;  Surgeon: Theodoro Kos, DO;  Location: [facility];  Service: Plastics;  Laterality: N/A;  I & D ABDOMINAL WOUND WITH VAC AND ACELL  . INCISION AND DRAINAGE OF WOUND N/A 05/01/2012   Procedure: IRRIGATION AND DEBRIDEMENT WOUND;  Surgeon: Theodoro Kos, DO;  Location: [facility];  Service: Plastics;  Laterality: N/A;  WITH SURGICAL PREP AND PLACEMENT OF VAC  . INCISION AND DRAINAGE OF WOUND N/A 05/08/2012   Procedure: IRRIGATION AND DEBRIDEMENT OF ABD  WOUND SURGICAL PREP AND PLACEMENT OF VAC ;  Surgeon: Theodoro Kos, DO;  Location: [facility];  Service: Plastics;  Laterality: N/A;  IRRIGATION AND DEBRIDEMENT OF ABD WOUND SURGICAL PREP AND PLACEMENT OF VAC   . INCISION AND DRAINAGE OF WOUND N/A 05/15/2012   Procedure: IRRIGATION AND DEBRIDEMENT OF ABDOMINAL WOUND WITH POSSIBLE SURGICAL PREP AND PLACEMENT OF VAC;  Surgeon: Theodoro Kos, DO;  Location: [facility];  Service: Plastics;  Laterality: N/A;  . INCISION AND DRAINAGE OF WOUND N/A 05/22/2012   Procedure: IRRIGATION AND DEBRIDEMENT OF ABODOMINAL WOUND WITH  SURGICAL PREP AND VAC PLACEMENT;  Surgeon: Theodoro Kos, DO;  Location: [facility];  Service: Plastics;  Laterality: N/A;  . INCISION AND DRAINAGE OF WOUND N/A 06/28/2012   Procedure: IRRIGATION AND DEBRIDEMENT OF ABDOMINAL ULCER SURGICAL PREP AND PLACEMENT OF ACELL AND VAC;  Surgeon: Theodoro Kos, DO;  Location: [facility];  Service: Plastics;  Laterality: N/A;  . INCISIONAL HERNIA REPAIR  02/07/2012   Procedure: HERNIA REPAIR INCISIONAL;  Surgeon: Gayland Curry, MD,FACS;  Location: [facility];  Service: General;;  Open, Primary repair, strangulated Incisional hernia.  Marland Kitchen LESION REMOVAL  04/17/2012   Procedure: LESION REMOVAL;  Surgeon: Theodoro Kos, DO;  Location: [facility];  Service: Plastics;  Laterality: N/A;   CENTER OF FOREHEAD, REMOVAL FORHEAD SKIN LESION  . ORIF TOE FRACTURE Left 02/09/2017   Procedure: EXCISION TALAR HEAD, INTERNAL FIXATION MEDIAL COLUMN LEFT FOOT;  Surgeon: Newt Minion, MD;  Location: [facility];  Service: Orthopedics;  Laterality: Left;  . POLYPECTOMY N/A 05/14/2014   Procedure: ENDOMETRIAL POLYPECTOMY;  Surgeon: Jonnie Kind, MD;  [facility];  Service: Gynecology;  Laterality: N/A;  . STUMP REVISION Left 08/31/2017   Procedure: REVISION LEFT BELOW KNEE AMPUTATION;  Surgeon: Newt Minion, MD;  Location: [facility];  Service: Orthopedics;   Laterality: Left;     Current Outpatient Medications  Medication Sig Dispense Refill  . aspirin 81 MG tablet Take 81 mg by mouth daily.     . fenofibrate 160 MG tablet TAKE 1 TABLET BY MOUTH  DAILY FOR CHOLESTEROL AND  TRIGLYCERIDES 90 tablet 1  . fexofenadine (ALLEGRA) 180 MG tablet Take 180 mg by mouth as needed.     . hydrochlorothiazide (MICROZIDE) 12.5 MG capsule TAKE 1 CAPSULE BY MOUTH EVERY DAY 30 capsule 5  . losartan (COZAAR) 100 MG tablet TAKE 1 TABLET BY MOUTH EVERY DAY 30 tablet 5  . Multiple Vitamins-Minerals (MULTIVITAMIN ADULT) TABS Take 1 tablet by mouth daily.    . nitroGLYCERIN (NITROSTAT) 0.4 MG SL tablet Place 1 tablet (0.4 mg total) under the tongue every 5 (five) minutes x 3 doses as needed for chest pain. If pain persist after  call 911 25 tablet 6  . sertraline (ZOLOFT) 50 MG tablet Take 1 tablet (50 mg total) by mouth daily. 90 tablet 1  . traZODone (DESYREL) 150 MG tablet TAKE 1 OR 2 TABLETS BY  MOUTH AT BEDTIME AS NEEDED  FOR SLEEP 180 tablet 0  . valsartan-hydrochlorothiazide (DIOVAN-HCT) 160-12.5 MG tablet Take 1 tablet by mouth daily. 90 tablet 1   No current facility-administered medications for this visit.     Allergies:   Ace inhibitors    ROS:  Please see the history of present illness.   Otherwise, review of systems are positive for none.   All other systems are reviewed and negative.    PHYSICAL EXAM: VS:  BP 136/66   Pulse (!) 56   Ht 5\' 6"  (1.676 m)   Wt 259 lb (117.5 kg) Comment: weighed in hospital for surgery in june 2019  BMI 41.80 kg/m  , BMI Body mass index is 41.8 kg/m. GENERAL:  Well appearing NECK:  No jugular venous distention, waveform within normal limits, carotid upstroke brisk and symmetric, no bruits, no thyromegaly LUNGS:  Clear to auscultation bilaterally CHEST:  Well healed sternotomy scar. HEART:  PMI not displaced or sustained,S1 and S2 within normal limits, no S3, no S4, no clicks, no rubs, no murmurs ABD:  Flat,  positive bowel sounds normal in frequency in pitch, no bruits, no rebound, no guarding, no midline pulsatile mass, no hepatomegaly, no splenomegaly EXT:  2 plus pulses throughout, no edema, no cyanosis no clubbing, status post left BKA   EKG:  EKG is not ordered today.   Recent Labs: 05/07/2017: TSH 0.280 06/23/2017: Magnesium 2.1 08/24/2017: ALT 15 09/05/2017: BUN 18; Creatinine, Ser 0.99; Potassium 4.5; Sodium 138 09/06/2017: Hemoglobin 8.3; Platelets 379    Lipid Panel    Component Value Date/Time   CHOL 135 08/24/2017 1114   TRIG 119 08/24/2017 1114   TRIG 247 (H) 09/12/2013 1137   HDL 42 08/24/2017 1114   HDL 35 (L) 09/12/2013 1137   CHOLHDL 3.2 08/24/2017 1114   LDLCALC 69 08/24/2017 1114   LDLCALC 115 (H) 09/12/2013 1137      Wt Readings from Last 3 Encounters:  11/23/17 259 lb (117.5 kg)  11/10/17 270 lb (122.5 kg)  10/04/17 270 lb (122.5 kg)      Other studies Reviewed: Additional studies/ records that were reviewed today include: Hospital records, cardiology consult. Review of the above records demonstrates:  Please see elsewhere in the note.     ASSESSMENT AND PLAN:  CAD:   The patient has no new sypmtoms.  No further cardiovascular testing is indicated.  We will continue with aggressive risk reduction and meds as listed.  SINUS BRADYCARDIA:    She has not had any problems and we are avoiding any AV nodal blocking agents given the history of bradycardia.  No change in therapy.  HTN:  The blood pressure is at target. No change in medications is indicated. We will continue with therapeutic lifestyle changes (TLC).  DYSLIPIDEMIA:  Lipids are excellent.  Continue current therapy.   Current medicines are reviewed at length with the patient today.  The patient does not have concerns regarding medicines.  The following changes have been made:  no change  Labs/ tests ordered today include: None No orders of the defined types were placed in this  encounter.    Disposition:   FU with me in one year.     Signed, [name], MD  11/23/2017 12:31 PM  [facility]

## 2017-11-23 ENCOUNTER — Other Ambulatory Visit: Payer: Self-pay | Admitting: Licensed Clinical Social Worker

## 2017-11-23 ENCOUNTER — Encounter: Payer: Self-pay | Admitting: Cardiology

## 2017-11-23 ENCOUNTER — Ambulatory Visit (INDEPENDENT_AMBULATORY_CARE_PROVIDER_SITE_OTHER): Payer: Medicare Other | Admitting: Cardiology

## 2017-11-23 VITALS — BP 136/66 | HR 56 | Ht 66.0 in | Wt 259.0 lb

## 2017-11-23 DIAGNOSIS — R001 Bradycardia, unspecified: Secondary | ICD-10-CM

## 2017-11-23 DIAGNOSIS — I2510 Atherosclerotic heart disease of native coronary artery without angina pectoris: Secondary | ICD-10-CM | POA: Diagnosis not present

## 2017-11-23 DIAGNOSIS — E785 Hyperlipidemia, unspecified: Secondary | ICD-10-CM | POA: Diagnosis not present

## 2017-11-23 NOTE — Patient Instructions (Signed)

## 2017-11-23 NOTE — Patient Outreach (Signed)
Assessment:  CSW spoke via phone with client. CSW verified client identity. CSW received verbal permission from client for CSW to speak with client about client needs. CSW spoke with client about client care plan.  CSW encouraged client to talk with CSW in next 30 days to discuss community resources of possible assistance to client at this time. CSW talked with Lelon Frohlich about [employer] in [place], [place]. [place] talked with client about [employer] pantry in [place], [place]. [place] talked with client about [employer] and transport help through that agency.  CSW informed client that transport agency provides transport support within [facility], [place].CSW talked with client about Aging, Disability and Transient Services and about in home care support through that agency.  Client said she is attending medical appointments as scheduled. She said she had her prescribed medications and is taking medications as prescribed. Client has been attending appointments as scheduled with Dr. Sharol Given.  Client said she has a [employer] which she uses to transport client to and from her appointments. She said her spouse drives [name] to transport client to and from her medical appointments.  Client has several adult children who live in close proximity to client. Client said she has strong  family support.  Client said she sees representative from [place] on December 01, 2017 regarding prosthetic device for client.  Client is glad that she will receive prosthetic device soon.  Client is not having any pain issues. Client said she also has a ramp at her home. She has a shower chair to use as needed.  CSW talked with client about [facility] program support in nursing, social work and pharmacy. CSW thanked client for phone call with CSW on 11/23/17. CSW invited client to call CSW as needed to discuss social work needs of client.     Plan:  Client to talk with CSW in the next 30 days to  discuss community resources of possible assistance to client at this time.  CSW to call client in 4 weeks to assess client needs.   Norva Riffle.Alfreida Steffenhagen MSW, LCSW Licensed Clinical Social Worker [facility] Care Management [phone]

## 2017-11-25 ENCOUNTER — Ambulatory Visit: Payer: Medicare Other | Admitting: Family Medicine

## 2017-12-07 DIAGNOSIS — S81009A Unspecified open wound, unspecified knee, initial encounter: Secondary | ICD-10-CM | POA: Diagnosis not present

## 2017-12-07 DIAGNOSIS — R2681 Unsteadiness on feet: Secondary | ICD-10-CM | POA: Diagnosis not present

## 2017-12-08 DIAGNOSIS — R2681 Unsteadiness on feet: Secondary | ICD-10-CM | POA: Diagnosis not present

## 2017-12-08 DIAGNOSIS — E11610 Type 2 diabetes mellitus with diabetic neuropathic arthropathy: Secondary | ICD-10-CM | POA: Diagnosis not present

## 2017-12-08 DIAGNOSIS — Z4789 Encounter for other orthopedic aftercare: Secondary | ICD-10-CM | POA: Diagnosis not present

## 2017-12-15 ENCOUNTER — Ambulatory Visit (INDEPENDENT_AMBULATORY_CARE_PROVIDER_SITE_OTHER): Payer: Medicare Other | Admitting: Orthopedic Surgery

## 2017-12-15 ENCOUNTER — Encounter (INDEPENDENT_AMBULATORY_CARE_PROVIDER_SITE_OTHER): Payer: Self-pay | Admitting: Orthopedic Surgery

## 2017-12-15 VITALS — Ht 66.0 in | Wt 259.0 lb

## 2017-12-15 DIAGNOSIS — Z89512 Acquired absence of left leg below knee: Secondary | ICD-10-CM | POA: Diagnosis not present

## 2017-12-15 NOTE — Progress Notes (Signed)
Office Visit Note   Patient: Julia West           Date of Birth: Oct 04, 1955           MRN: [id number] Visit Date: 12/15/2017              Requested by: [name], MD [place], [place] [address] PCP: [name], MD  Chief Complaint  Patient presents with  . Left Leg - Follow-up    Left Revision BKA      HPI: Patient is a 62 year old woman status post revision left transtibial amputation she states she is doing better she does have an appointment with [employer] for prosthetic fitting.  Assessment & Plan: Visit Diagnoses:  1. 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  We will get her set up with [facility] for gait training once the leg is fabricated.  Follow-Up Instructions: Return in about 2 months (around 02/14/2018).   Ortho Exam  Patient is alert, oriented, no adenopathy, well-dressed, normal affect, normal respiratory effort. Examination the dermatitis is improving she still has dry flaky skin with dermatitis.  There is no drainage at this time no cellulitis no tenderness to palpation no signs of infection.  Imaging: No results found. No images are attached to the encounter.  Labs: Lab Results  Component Value Date   HGBA1C 5.4 05/09/2017   HGBA1C 5.6 02/07/2017   ESRSEDRATE 84 (H) 09/05/2017   CRP 7.9 (H) 09/05/2017   REPTSTATUS 09/10/2017 FINAL 09/05/2017   GRAMSTAIN  04/24/2012    NO WBC SEEN NO SQUAMOUS EPITHELIAL CELLS SEEN MODERATE GRAM POSITIVE COCCI IN PAIRS RARE GRAM NEGATIVE RODS   GRAMSTAIN  04/24/2012    NO WBC SEEN NO SQUAMOUS EPITHELIAL CELLS SEEN MODERATE GRAM POSITIVE COCCI IN PAIRS RARE GRAM NEGATIVE RODS   CULT  09/05/2017    NO GROWTH 5 DAYS Performed at [facility], [place] [address], [place],  [address]    [place] 04/24/2012     Lab Results  Component Value Date   ALBUMIN 4.0  08/24/2017   ALBUMIN 4.6 12/10/2016   ALBUMIN 4.4 08/20/2015    Body mass index is 41.8 kg/m.  Orders:  No orders of the defined types were placed in this encounter.  No orders of the defined types were placed in this encounter.    Procedures: No procedures performed  Clinical Data: No additional findings.  ROS:  All other systems negative, except as noted in the HPI. Review of Systems  Objective: Vital Signs: Ht 5\' 6"  (1.676 m)   Wt 259 lb (117.5 kg)   BMI 41.80 kg/m   Specialty Comments:  No specialty comments available.  PMFS History: Patient Active Problem List   Diagnosis Date Noted  . Dyslipidemia 11/23/2017  . Below knee amputation status, left 08/31/2017  . Dehiscence of amputation stump ([facility]) 08/29/2017  . Infection of amputation stump, left lower extremity ([place]) 06/19/2017  . Bradycardia 05/07/2017  . Depression 05/07/2017  . Lymphedema 05/07/2017  . Charcot foot due to diabetes mellitus ([place]) 02/09/2017  . 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 Post-menopausal bleeding 04/15/2014  . Endometrial polyp 04/09/2014  . Ventral hernia 03/06/2012  . Open abdominal wall wound 03/06/2012  . Hyperlipemia 11/12/2008  . OBESITY, MORBID 11/12/2008  . Essential hypertension 11/12/2008  . CAD (coronary artery  disease) 11/12/2008   Past Medical History:  Diagnosis Date  . Acute myocardial infarction    with a ruptured plaque in the circumflex in 2003  . Anxiety   . Cancer ([place])    basal cell face  . Complication of anesthesia    states low O2 sats post-op 11/13, always slow to awaken  . Degenerative joint disease   . 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 Dyspnea    with activity- car to house - steps  . HTN (hypertension)   . Hyperlipidemia LDL goal <70   . Internal and external hemorrhoids without complication   . Lymphedema    right side of body  . Lymphedema of arm    right  . Morbid obesity ([place])     . Surgical wound, non healing ABDOMINAL    has wound vac @ 125 mm Hg    Family History  Problem Relation Age of Onset  . Heart attack Mother   . Hypertension Sister   . Diabetes Brother   . Birth defects Son        heart defect    Past Surgical History:  Procedure Laterality Date  . AMPUTATION Left 05/06/2017   Procedure: BELOW KNEE AMPUTATION;  Surgeon: Newt Minion, MD;  Location: [facility];  Service: Orthopedics;  Laterality: Left;  . APPLICATION OF WOUND VAC    . BOWEL RESECTION  02/07/2012   Procedure: SMALL BOWEL RESECTION;  Surgeon: Gayland Curry, MD,FACS;  Location: [facility];  Service: General;;  . CARDIOVASCULAR STRESS TEST  01-17-2012  DR HOCHREIN   LOW RISK NUCLEAR TEST  . CESAREAN SECTION     x 4 in remote past  . CHOLECYSTECTOMY    . CORONARY ARTERY BYPASS GRAFT  2003   by Dr. Servando Snare. LIMA to the LAD, free RIMA to the circumflex. Stress perfusion study December 2009 with no high-risk areas of ischemia. She has a well-preserved ejection fraction  . HYSTEROSCOPY W/D&C N/A 05/14/2014   Procedure: DILATATION AND CURETTAGE (no specimen); HYSTEROSCOPY;  Surgeon: Jonnie Kind, MD;  [facility];  Service: Gynecology;  Laterality: N/A;  . I&D EXTREMITY Left 06/21/2017   Procedure: IRRIGATION AND DEBRIDEMENT OF LEFT LEG AMPUTATION SITE;  Surgeon: Wallace Going, DO;  [facility];  Service: Plastics;  Laterality: Left;  . INCISION AND DRAINAGE OF WOUND  04/17/2012   Procedure: IRRIGATION AND DEBRIDEMENT WOUND;  Surgeon: Theodoro Kos, DO;  Location: [facility];  Service: Plastics;  Laterality: N/A;  OF ABDOMINAL WOUND, SURGICAL PREP AND PLACEMENT OF VAC, REMOVAL FOREHEAD SKIN LESION  . INCISION AND DRAINAGE OF WOUND N/A 04/24/2012   Procedure: IRRIGATION AND DEBRIDEMENT WOUND;  Surgeon: Theodoro Kos, DO;  Location: [facility];  Service: Plastics;  Laterality: N/A;  I & D ABDOMINAL WOUND WITH VAC AND ACELL  . INCISION AND DRAINAGE OF WOUND  N/A 05/01/2012   Procedure: IRRIGATION AND DEBRIDEMENT WOUND;  Surgeon: Theodoro Kos, DO;  Location: [place] [facility];  Service: Plastics;  Laterality: N/A;  WITH SURGICAL PREP AND PLACEMENT OF VAC  . INCISION AND DRAINAGE OF WOUND N/A 05/08/2012   Procedure: IRRIGATION AND DEBRIDEMENT OF ABD WOUND SURGICAL PREP AND PLACEMENT OF VAC ;  Surgeon: Theodoro Kos, DO;  Location: [facility];  Service: Plastics;  Laterality: N/A;  IRRIGATION AND DEBRIDEMENT OF ABD WOUND SURGICAL PREP AND PLACEMENT OF VAC   . INCISION AND DRAINAGE OF WOUND N/A 05/15/2012  Procedure: IRRIGATION AND DEBRIDEMENT OF ABDOMINAL WOUND WITH POSSIBLE SURGICAL PREP AND PLACEMENT OF VAC;  Surgeon: Theodoro Kos, DO;  Location: [facility];  Service: Plastics;  Laterality: N/A;  . INCISION AND DRAINAGE OF WOUND N/A 05/22/2012   Procedure: IRRIGATION AND DEBRIDEMENT OF ABODOMINAL WOUND WITH  SURGICAL PREP AND VAC PLACEMENT;  Surgeon: Theodoro Kos, DO;  Location: [facility];  Service: Plastics;  Laterality: N/A;  . INCISION AND DRAINAGE OF WOUND N/A 06/28/2012   Procedure: IRRIGATION AND DEBRIDEMENT OF ABDOMINAL ULCER SURGICAL PREP AND PLACEMENT OF ACELL AND VAC;  Surgeon: Theodoro Kos, DO;  Location: [facility];  Service: Plastics;  Laterality: N/A;  . INCISIONAL HERNIA REPAIR  02/07/2012   Procedure: HERNIA REPAIR INCISIONAL;  Surgeon: Gayland Curry, MD,FACS;  Location: [facility];  Service: General;;  Open, Primary repair, strangulated Incisional hernia.  Marland Kitchen LESION REMOVAL  04/17/2012   Procedure: LESION REMOVAL;  Surgeon: Theodoro Kos, DO;  Location: [facility];  Service: Plastics;  Laterality: N/A;   CENTER OF FOREHEAD, REMOVAL FORHEAD SKIN LESION  . ORIF TOE FRACTURE Left 02/09/2017   Procedure: EXCISION TALAR HEAD, INTERNAL FIXATION MEDIAL COLUMN LEFT FOOT;  Surgeon: Newt Minion, MD;  Location: [facility];  Service: Orthopedics;  Laterality: Left;  .  POLYPECTOMY N/A 05/14/2014   Procedure: ENDOMETRIAL POLYPECTOMY;  Surgeon: Jonnie Kind, MD;  [facility];  Service: Gynecology;  Laterality: N/A;  . STUMP REVISION Left 08/31/2017   Procedure: REVISION LEFT BELOW KNEE AMPUTATION;  Surgeon: Newt Minion, MD;  Location: [place];  Service: Orthopedics;  Laterality: Left;   Social History   Occupational History  . Occupation: Estate manager/land agent    Comment: In the [employer]  Tobacco Use  . Smoking status: Former Smoker    Packs/day: 1.00    Years: 25.00    Pack years: 25.00    Types: Cigarettes    Last attempt to quit: 11/17/2011    Years since quitting: 6.0  . Smokeless tobacco: Never Used  Substance and Sexual Activity  . Alcohol use: No  . Drug use: No  . Sexual activity: Yes    Birth control/protection: Post-menopausal

## 2017-12-19 ENCOUNTER — Other Ambulatory Visit: Payer: Self-pay | Admitting: Family Medicine

## 2017-12-19 DIAGNOSIS — E785 Hyperlipidemia, unspecified: Secondary | ICD-10-CM

## 2017-12-19 NOTE — Telephone Encounter (Signed)
Last seen 08/24/17  Dr Livia Snellen

## 2017-12-22 ENCOUNTER — Other Ambulatory Visit: Payer: Self-pay | Admitting: Licensed Clinical Social Worker

## 2017-12-22 NOTE — Patient Outreach (Signed)
Assessment:  CSW spoke via phone with client.  CSW verified client identity. CSW received verbal permission from client for CSW to speak with client about client needs. CSW spoke with client about client care plan.  CSW encouraged Julia West to communicate with CSW in the next 30 days to talk about community resources of possible assistance to client at this time. CSW had talked with Julia West about local food pantries such as [employer] in [place], [place] and about [employer] pantry in [place], [place]. [name] has discussed with client about possible transport  help for client through  [place]. CSW has talked with client about resources for in home care support for client. CSW has discussed with client the support program for in home care through Aging,Disability and Transient Services. Client sees Dr. Livia Snellen as primary care doctor. Client has also been attending appointments as scheuled with Dr. Sharol Given.  Client continues to use [name] to help transport her to and from her medical appointments. . Her spouse is supportive and her spouse drives [name] to transport client to and from client's medical appointments.  Client has said she has strong family support.  Client has had several  appointments with representative from [place] regarding prosthetic device for client. Client has said she has equipment to use as needed in the home. She has a ramp to help her go in and out of her home. Client has prescribed medications and is taking medications as prescribed. CSW thanked client for phone call with CSW on 12/22/17. CSW encouraged [name] to call CSW as needed to discuss social work needs of client.  Client was appreciative of phone call from [name] on 12/22/17.   Plan:  Client to talk with CSW in the next 30 days to discuss community resources of possible assistance to client at this time.   CSW to call client in 4 weeks to assess client  needs.   Julia West.Julia West MSW, LCSW Licensed Clinical Social Worker [name] Care Management [phone]

## 2018-01-03 ENCOUNTER — Other Ambulatory Visit: Payer: Self-pay

## 2018-01-03 ENCOUNTER — Telehealth: Payer: Self-pay | Admitting: Family Medicine

## 2018-01-03 MED ORDER — LOSARTAN POTASSIUM 100 MG PO TABS: 100.0000 mg | ORAL_TABLET | Freq: Every day | ORAL | 0 refills | Status: DC

## 2018-01-03 MED ORDER — HYDROCHLOROTHIAZIDE 12.5 MG PO CAPS: ORAL_CAPSULE | ORAL | 0 refills | Status: DC

## 2018-01-03 NOTE — Telephone Encounter (Signed)
done

## 2018-01-06 DIAGNOSIS — S81009A Unspecified open wound, unspecified knee, initial encounter: Secondary | ICD-10-CM | POA: Diagnosis not present

## 2018-01-06 DIAGNOSIS — R2681 Unsteadiness on feet: Secondary | ICD-10-CM | POA: Diagnosis not present

## 2018-01-07 DIAGNOSIS — E11610 Type 2 diabetes mellitus with diabetic neuropathic arthropathy: Secondary | ICD-10-CM | POA: Diagnosis not present

## 2018-01-07 DIAGNOSIS — Z4789 Encounter for other orthopedic aftercare: Secondary | ICD-10-CM | POA: Diagnosis not present

## 2018-01-07 DIAGNOSIS — R2681 Unsteadiness on feet: Secondary | ICD-10-CM | POA: Diagnosis not present

## 2018-01-25 ENCOUNTER — Encounter: Payer: Self-pay | Admitting: Licensed Clinical Social Worker

## 2018-01-25 ENCOUNTER — Ambulatory Visit (INDEPENDENT_AMBULATORY_CARE_PROVIDER_SITE_OTHER): Payer: Medicare Other | Admitting: *Deleted

## 2018-01-25 ENCOUNTER — Other Ambulatory Visit: Payer: Self-pay | Admitting: Licensed Clinical Social Worker

## 2018-01-25 DIAGNOSIS — Z23 Encounter for immunization: Secondary | ICD-10-CM | POA: Diagnosis not present

## 2018-01-25 NOTE — Patient Outreach (Signed)
Assessment:  CSW consulted with Bary Castilla regarding client status and needs.  Bary Castilla recommended that CSW discharge client from [facility] since client had been given information about community resources of assistance.Thus, CSW is discharging Julia West from [facility] services on 01/25/18 since client care plan goal has been met.   Norva Riffle.Dustine Bertini MSW, LCSW Licensed Clinical Social Worker [facility] Care Management [phone]

## 2018-02-06 ENCOUNTER — Other Ambulatory Visit: Payer: Self-pay | Admitting: Family Medicine

## 2018-02-06 DIAGNOSIS — S81009A Unspecified open wound, unspecified knee, initial encounter: Secondary | ICD-10-CM | POA: Diagnosis not present

## 2018-02-06 DIAGNOSIS — R2681 Unsteadiness on feet: Secondary | ICD-10-CM | POA: Diagnosis not present

## 2018-02-07 ENCOUNTER — Telehealth (INDEPENDENT_AMBULATORY_CARE_PROVIDER_SITE_OTHER): Payer: Self-pay | Admitting: Orthopedic Surgery

## 2018-02-07 ENCOUNTER — Other Ambulatory Visit (INDEPENDENT_AMBULATORY_CARE_PROVIDER_SITE_OTHER): Payer: Self-pay

## 2018-02-07 DIAGNOSIS — Z89512 Acquired absence of left leg below knee: Secondary | ICD-10-CM

## 2018-02-07 DIAGNOSIS — Z4789 Encounter for other orthopedic aftercare: Secondary | ICD-10-CM | POA: Diagnosis not present

## 2018-02-07 DIAGNOSIS — R2681 Unsteadiness on feet: Secondary | ICD-10-CM | POA: Diagnosis not present

## 2018-02-07 DIAGNOSIS — E11610 Type 2 diabetes mellitus with diabetic neuropathic arthropathy: Secondary | ICD-10-CM | POA: Diagnosis not present

## 2018-02-07 NOTE — Telephone Encounter (Signed)
[employer]  [phone]  [name]   Physical therapy for [employer]

## 2018-02-07 NOTE — Telephone Encounter (Signed)
Order for prosthetic gait train in chart for [facility] neuro rehab.

## 2018-02-08 ENCOUNTER — Ambulatory Visit: Payer: Self-pay | Admitting: *Deleted

## 2018-02-08 ENCOUNTER — Encounter: Payer: Medicare Other | Admitting: Physical Therapy

## 2018-02-08 DIAGNOSIS — E11610 Type 2 diabetes mellitus with diabetic neuropathic arthropathy: Secondary | ICD-10-CM

## 2018-02-08 NOTE — Chronic Care Management (AMB) (Signed)
  Chronic Care Management   Note  02/08/2018 Name: Julia West MRN: [id number] DOB: 1955/05/13  Initial telephone outreach to [name] today in response to a referral by her health plan for chronic care management services. We were unable to reach [name] today but left a message requesting a return call. [name] may benefit from chronic care management around DM management including post-surgical care coordination.   Plan: The care management team will attempt to make contact with [name] again over the next 7 days.   [place]  [phone]

## 2018-02-13 ENCOUNTER — Ambulatory Visit (INDEPENDENT_AMBULATORY_CARE_PROVIDER_SITE_OTHER): Payer: Medicare Other | Admitting: Orthopedic Surgery

## 2018-02-13 ENCOUNTER — Encounter (INDEPENDENT_AMBULATORY_CARE_PROVIDER_SITE_OTHER): Payer: Self-pay | Admitting: Orthopedic Surgery

## 2018-02-13 ENCOUNTER — Other Ambulatory Visit: Payer: Self-pay

## 2018-02-13 ENCOUNTER — Encounter: Payer: Self-pay | Admitting: Physical Therapy

## 2018-02-13 ENCOUNTER — Ambulatory Visit: Payer: Medicare Other | Attending: Family Medicine | Admitting: Physical Therapy

## 2018-02-13 VITALS — Ht 66.0 in | Wt 259.0 lb

## 2018-02-13 DIAGNOSIS — M6281 Muscle weakness (generalized): Secondary | ICD-10-CM | POA: Diagnosis not present

## 2018-02-13 DIAGNOSIS — R2689 Other abnormalities of gait and mobility: Secondary | ICD-10-CM | POA: Diagnosis not present

## 2018-02-13 DIAGNOSIS — R293 Abnormal posture: Secondary | ICD-10-CM | POA: Insufficient documentation

## 2018-02-13 DIAGNOSIS — M25661 Stiffness of right knee, not elsewhere classified: Secondary | ICD-10-CM | POA: Diagnosis not present

## 2018-02-13 DIAGNOSIS — Z89512 Acquired absence of left leg below knee: Secondary | ICD-10-CM

## 2018-02-13 DIAGNOSIS — R2681 Unsteadiness on feet: Secondary | ICD-10-CM

## 2018-02-13 DIAGNOSIS — M25662 Stiffness of left knee, not elsewhere classified: Secondary | ICD-10-CM | POA: Insufficient documentation

## 2018-02-13 NOTE — Therapy (Signed)
[place] [facility] [address] [place], [place], [address] Phone: [phone]   Fax:  [phone]  Physical Therapy Evaluation  Patient Details  Name: Julia West MRN: [id number] Date of Birth: 1956/02/06 Referring Provider (PT): Meridee Score, MD   Encounter Date: 02/13/2018  PT End of Session - 02/13/18 0917    Visit Number  1    Number of Visits  26    Date for PT Re-Evaluation  05/12/18    Authorization Type  Medicare & Medicaid     PT Start Time  0805    PT Stop Time  0845    PT Time Calculation (min)  40 min    Equipment Utilized During Treatment  Gait belt    Activity Tolerance  Patient tolerated treatment well;Patient limited by fatigue    Behavior During Therapy  [facility] for tasks assessed/performed       Past Medical History:  Diagnosis Date  . Acute myocardial infarction    with a ruptured plaque in the circumflex in 2003  . Anxiety   . Cancer ([place])    basal cell face  . Complication of anesthesia    states low O2 sats post-op 11/13, always slow to awaken  . Degenerative joint disease   . 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 Dyspnea    with activity- car to house - steps  . HTN (hypertension)   . Hyperlipidemia LDL goal <70   . Internal and external hemorrhoids without complication   . Lymphedema    right side of body  . Lymphedema of arm    right  . Morbid obesity ([place])   . Surgical wound, non healing ABDOMINAL    has wound vac @ 125 mm Hg    Past Surgical History:  Procedure Laterality Date  . AMPUTATION Left 05/06/2017   Procedure: BELOW KNEE AMPUTATION;  Surgeon: Newt Minion, MD;  Location: [place];  Service: Orthopedics;  Laterality: Left;  . APPLICATION OF WOUND VAC    . BOWEL RESECTION  02/07/2012   Procedure: SMALL BOWEL RESECTION;  Surgeon: Gayland Curry, MD,FACS;  Location: [place];  Service: General;;  . CARDIOVASCULAR STRESS TEST  01-17-2012  DR HOCHREIN   LOW RISK  NUCLEAR TEST  . CESAREAN SECTION     x 4 in remote past  . CHOLECYSTECTOMY    . CORONARY ARTERY BYPASS GRAFT  2003   by Dr. Servando Snare. LIMA to the LAD, free RIMA to the circumflex. Stress perfusion study December 2009 with no high-risk areas of ischemia. She has a well-preserved ejection fraction  . HYSTEROSCOPY W/D&C N/A 05/14/2014   Procedure: DILATATION AND CURETTAGE (no specimen); HYSTEROSCOPY;  Surgeon: Jonnie Kind, MD;  Location: [facility];  Service: Gynecology;  Laterality: N/A;  . I&D EXTREMITY Left 06/21/2017   Procedure: IRRIGATION AND DEBRIDEMENT OF LEFT LEG AMPUTATION SITE;  Surgeon: Wallace Going, DO;  Location: [facility];  Service: Plastics;  Laterality: Left;  . INCISION AND DRAINAGE OF WOUND  04/17/2012   Procedure: IRRIGATION AND DEBRIDEMENT WOUND;  Surgeon: Theodoro Kos, DO;  Location: [facility];  Service: Plastics;  Laterality: N/A;  OF ABDOMINAL WOUND, SURGICAL PREP AND PLACEMENT OF VAC, REMOVAL FOREHEAD SKIN LESION  . INCISION AND DRAINAGE OF WOUND N/A 04/24/2012   Procedure: IRRIGATION AND DEBRIDEMENT WOUND;  Surgeon: Theodoro Kos, DO;  Location: [place];  Service: Plastics;  Laterality: N/A;  I & D ABDOMINAL  WOUND WITH VAC AND ACELL  . INCISION AND DRAINAGE OF WOUND N/A 05/01/2012   Procedure: IRRIGATION AND DEBRIDEMENT WOUND;  Surgeon: Theodoro Kos, DO;  Location: [place];  Service: Plastics;  Laterality: N/A;  WITH SURGICAL PREP AND PLACEMENT OF VAC  . INCISION AND DRAINAGE OF WOUND N/A 05/08/2012   Procedure: IRRIGATION AND DEBRIDEMENT OF ABD WOUND SURGICAL PREP AND PLACEMENT OF VAC ;  Surgeon: Theodoro Kos, DO;  Location: [place];  Service: Plastics;  Laterality: N/A;  IRRIGATION AND DEBRIDEMENT OF ABD WOUND SURGICAL PREP AND PLACEMENT OF VAC   . INCISION AND DRAINAGE OF WOUND N/A 05/15/2012   Procedure: IRRIGATION AND DEBRIDEMENT OF ABDOMINAL WOUND WITH POSSIBLE SURGICAL PREP AND PLACEMENT OF VAC;   Surgeon: Theodoro Kos, DO;  Location: [place];  Service: Plastics;  Laterality: N/A;  . INCISION AND DRAINAGE OF WOUND N/A 05/22/2012   Procedure: IRRIGATION AND DEBRIDEMENT OF ABODOMINAL WOUND WITH  SURGICAL PREP AND VAC PLACEMENT;  Surgeon: Theodoro Kos, DO;  Location: [place];  Service: Plastics;  Laterality: N/A;  . INCISION AND DRAINAGE OF WOUND N/A 06/28/2012   Procedure: IRRIGATION AND DEBRIDEMENT OF ABDOMINAL ULCER SURGICAL PREP AND PLACEMENT OF ACELL AND VAC;  Surgeon: Theodoro Kos, DO;  Location: [place];  Service: Plastics;  Laterality: N/A;  . INCISIONAL HERNIA REPAIR  02/07/2012   Procedure: HERNIA REPAIR INCISIONAL;  Surgeon: Gayland Curry, MD,FACS;  Location: [place];  Service: General;;  Open, Primary repair, strangulated Incisional hernia.  Marland Kitchen LESION REMOVAL  04/17/2012   Procedure: LESION REMOVAL;  Surgeon: Theodoro Kos, DO;  Location: [place];  Service: Plastics;  Laterality: N/A;   CENTER OF FOREHEAD, REMOVAL FORHEAD SKIN LESION  . ORIF TOE FRACTURE Left 02/09/2017   Procedure: EXCISION TALAR HEAD, INTERNAL FIXATION MEDIAL COLUMN LEFT FOOT;  Surgeon: Newt Minion, MD;  Location: [place];  Service: Orthopedics;  Laterality: Left;  . POLYPECTOMY N/A 05/14/2014   Procedure: ENDOMETRIAL POLYPECTOMY;  Surgeon: Jonnie Kind, MD;  Location: [facility];  Service: Gynecology;  Laterality: N/A;  . STUMP REVISION Left 08/31/2017   Procedure: REVISION LEFT BELOW KNEE AMPUTATION;  Surgeon: Newt Minion, MD;  Location: [place];  Service: Orthopedics;  Laterality: Left;    There were no vitals filed for this visit.   Subjective Assessment - 02/13/18 0808    Subjective  This 62yo female was referred to PT on 02/07/2018 by Meridee Score, MD. She underwent a left Transtibial Amputation on 05/06/2017 and revision surgery on 08/31/2017. She received prosthesis 02/02/2018.     Patient is accompained by:  Family member   husband  who is deaf   Pertinent History  L TTA, bradycardia, depression, lymphedema, DM2, HLD, HTN, CAD, MI, CABG, CA basal cell face, bowel resection,     Limitations  Lifting;Standing;Walking;House hold activities    Patient Stated Goals  To use prosthesis to get in/out cars, bathrooms, access community & home    Currently in Pain?  No/denies         [facility] PT Assessment - 02/13/18 0805      Assessment   Medical Diagnosis  left Transtibial Amputation    Referring Provider (PT)  Meridee Score, MD    Onset Date/Surgical Date  02/02/18   Prosthesis delivery   Hand Dominance  Right      Precautions   Precautions  Fall      Balance Screen   Has the patient  fallen in the past 6 months  No    Has the patient had a decrease in activity level because of a fear of falling?   Yes    Is the patient reluctant to leave their home because of a fear of falling?   Yes      [place] residence    Living Arrangements  Spouse/significant other   4 children within 15 minutes   Type of [place] entrance;Stairs to enter    Entrance Stairs-Number of Steps  6    Entrance Stairs-Rails  Right;Left;Cannot reach both    [place]  One level    [place] - 2 wheels;Walker - 4 wheels;Cane - single point;Bedside commode;Wheelchair - power;Wheelchair - manual;Hospital bed      Prior Function   Level of Independence  Independent;Independent with household mobility with device;Independent with community mobility with device   cane for last year prior to amputation   Vocation  On disability    Leisure  shopping, yard Press photographer, casino,       Posture/Postural Control   Posture/Postural Control  Postural limitations    Postural Limitations  Rounded Shoulders;Forward head;Flexed trunk;Weight shift right      ROM / Strength   AROM / PROM / Strength  PROM;Strength      PROM   Overall PROM   Deficits    Right Knee Extension  -14     Left Knee Extension  -24      Strength   Overall Strength  Deficits    Overall Strength Comments  right shoulder 3-/5 pain endfeel    Strength Assessment Site  Hip;Knee;Ankle    Right Hip Flexion  3-/5    Right Hip Extension  3-/5    Right Hip ABduction  3-/5    Left Hip Flexion  3-/5    Left Hip Extension  3-/5    Left Hip ABduction  3-/5    Right Knee Flexion  3-/5    Right Knee Extension  3-/5    Left Knee Flexion  3-/5    Left Knee Extension  3-/5    Right Ankle Dorsiflexion  4/5      Transfers   Transfers  Sit to Stand;Stand to Sit    Sit to Stand  4: Min assist;With upper extremity assist;With armrests;From chair/3-in-1;Other (comment)   to RW for stabiliation   Stand to Sit  5: Supervision;With upper extremity assist;With armrests;To chair/3-in-1   from RW for stability     Ambulation/Gait   Ambulation/Gait  Yes    Ambulation/Gait Assistance  4: Min assist    Ambulation/Gait Assistance Details  excessive UE weight bearing on RW with partial weight on prosthesis    Ambulation Distance (Feet)  40 Feet    Assistive device  Rolling walker;Prosthesis    Gait Pattern  Step-to pattern;Decreased step length - right;Decreased stance time - left;Decreased stride length;Decreased hip/knee flexion - left;Decreased weight shift to left;Left hip hike;Right flexed knee in stance;Left flexed knee in stance;Antalgic;Lateral hip instability;Trunk flexed;Abducted - left;Poor foot clearance - left    Ambulation Surface  Indoor;Level    Gait velocity  0.23 ft/sec      Balance   Balance Assessed  Yes      Static Standing Balance   Static Standing - Balance Support  Bilateral upper extremity supported;No upper extremity supported    Static Standing - Level of Assistance  5: Stand by assistance    Static Standing Balance -  Activities   [place] - Eyes Opened    Static Standing - Comment/# of Minutes  multiple attempts to stand 30 seconds without UE support;  no assist with BUE support on RW       Dynamic Standing Balance   Dynamic Standing - Balance Support  Left upper extremity supported;During functional activity   functional activity blowing her nose with min guard   Dynamic Standing - Level of Assistance  4: Min assist    Dynamic Standing - Balance Activities  Head nods;Head turns;Eyes opn;Reaching for objects    Dynamic Standing - Comments  scans with BUE support with upper body/cervical motion only and reaches 5" with RUE and to ankle level      Prosthetics Assessment - 02/13/18 0805      Prosthetics   Prosthetic Care Dependent with  Skin check;Residual limb care;Care of non-amputated limb;Prosthetic cleaning;Ply sock cleaning;Correct ply sock adjustment;Proper wear schedule/adjustment;Proper weight-bearing schedule/adjustment    Donning prosthesis   Mod assist    Doffing prosthesis   Min assist    Current prosthetic wear tolerance (days/week)   3 of 11 days since delivery    Current prosthetic wear tolerance (#hours/day)   15 minutes    Current prosthetic weight-bearing tolerance (hours/day)   Patient tolerated 5 minutes of standing & gait with partial weight on prosthesis with no c/o pain or discomfort    Edema  pitting    Residual limb condition   scabs dry on incision, redness distally, dry skin, normal temperature, cylinderical shape with excessive soft tissue distal to tibia    K code/activity level with prosthetic use   K2 limited community mobility               Objective measurements completed on examination: See above findings.      [facility] Adult PT Treatment/Exercise - 02/13/18 0805      Prosthetics   Prosthetic Care Comments   PT instructed to initiate wear 2hrs 2x/day with increase weekly if no issues. PT demo, instructed in sitting positioning of prosthesis.     Education Provided  Skin check;Residual limb care;Prosthetic cleaning;Correct ply sock adjustment;Proper Donning;Proper Doffing;Proper wear schedule/adjustment;Proper weight-bearing  schedule/adjustment;Other (comment)   see prosthetic care comments   Person(s) Educated  Patient;Spouse    Education Method  Explanation;Demonstration;Tactile cues;Verbal cues    Education Method  Verbalized understanding;Returned demonstration;Verbal cues required;Tactile cues required;Needs further instruction               PT Short Term Goals - 02/13/18 1218      PT SHORT TERM GOAL #1   Title  Patient verbalizes proper cleaning & demonstrates proper donning of prosthesis. (All STGs Target Date: 03/14/2018)     NEED TO SET NEW STGs after these are checked   Time  1    Period  Months    Status  New    Target Date  03/14/18      PT SHORT TERM GOAL #2   Title  Patient tolerates prosthesis wear >8 hrs total / day without skin issues.     Time  1    Period  Months    Status  New    Target Date  03/14/18      PT SHORT TERM GOAL #3   Title  Patient performs standing balance with RW including reaching 10", picking up items from floor and managing clothes with supervision.     Time  1    Period  Months    Status  New    Target Date  03/14/18      PT SHORT TERM GOAL #4   Title  Patient ambulates 100' with RW & prosthesis with supervision.     Time  1    Period  Months    Status  New    Target Date  03/14/18      PT SHORT TERM GOAL #5   Title  Patient negotiates ramps & curbs with RW & prosthesis with minA.     Time  1    Period  Months    Status  New    Target Date  03/14/18        PT Long Term Goals - 02/13/18 0927      PT LONG TERM GOAL #1   Title  Patient demonstrates and verbalizes proper prosthetic care to enable safe use of prosthesis (All LTGs Target Date: 05/12/2018)    Time  3    Period  Months    Status  New    Target Date  05/12/18      PT LONG TERM GOAL #2   Title  Patient tolerates wear of prosthesis >90% of awake hours without skin or limb pain issues to enable function throughout her day.     Time  3    Period  Months    Status  New     Target Date  05/12/18      PT LONG TERM GOAL #3   Title  Lillie Fragmin >36/56    Time  3    Period  Months    Status  New    Target Date  05/12/18      PT LONG TERM GOAL #4   Title  Patient ambulates 300' with LRAD & prosthesis modified independent for basic community mobility.     Time  3    Period  Months    Status  New    Target Date  05/12/18      PT LONG TERM GOAL #5   Title  Patient negotiates ramps, curbs & stairs with LRAD & prosthesis modified independent for community access.     Time  3    Period  Months    Status  New    Target Date  05/12/18             Plan - 02/13/18 0919    Clinical Impression Statement  This 62yo female underwent left Transtibial Amputation 05/06/2017 with 2 revision surgeries. She has been w/c bound with limited activity for greater than one year with resulting deconditioning and has history of multiple orthopedic issues. She received her first prosthesis 02/02/2018 and is dependent in use & care. She has worn prosthesis 3 times of 11 days since delivery for 15 minutes and limited wear decreases function, increases fall risk and increases stress on RLE. Her balance is impaired requiring UE support on RW and dependency in standing ADLs. Her gait is impaired requiring RW support, minimal assist, deviations & gait velocity of 0.23 ft/sec indicating fall risk. Patient would benefit from skilled care to progress function with prosthesis.     History and Personal Factors relevant to plan of care:  w/c bound for >1 year, lives with husband who is deaf, L TTA, bradycardia, depression, lymphedema, DM2, HLD, HTN, CAD, MI, CABG, CA basal cell face, bowel resection,     Clinical Presentation  Evolving    Clinical Presentation  due to:  high fall risk, dependency in prosthetic care, wounds on residual limb, DM2, cardiac history    Clinical Decision Making  Moderate    Rehab Potential  Good    PT Frequency  2x / week    PT Duration  Other (comment)   13  weeks (90 days)   PT Treatment/Interventions  ADLs/Self Care Home Management;DME Instruction;Gait training;Stair training;Functional mobility training;Therapeutic activities;Therapeutic exercise;Balance training;Neuromuscular re-education;Patient/family education;Prosthetic Training;Manual techniques;Vestibular    PT Next Visit Plan  review prosthetic care, prosthetic gait with RW, HEP at sink for midline    Consulted and Agree with Plan of Care  Patient;Family member/caregiver    Family Member Consulted  husband who is deaf       Patient will benefit from skilled therapeutic intervention in order to improve the following deficits and impairments:  Abnormal gait, Decreased activity tolerance, Decreased balance, Cardiopulmonary status limiting activity, Decreased coordination, Decreased endurance, Decreased knowledge of use of DME, Decreased mobility, Decreased range of motion, Decreased skin integrity, Decreased strength, Dizziness, Impaired flexibility, Increased edema, Obesity, Prosthetic Dependency, Postural dysfunction  Visit Diagnosis: Abnormal posture  Unsteadiness on feet  Other abnormalities of gait and mobility  Muscle weakness (generalized)  Stiffness of left knee, not elsewhere classified  Stiffness of right knee, not elsewhere classified     Problem List Patient Active Problem List   Diagnosis Date Noted  . Dyslipidemia 11/23/2017  . Below knee amputation status, left 08/31/2017  . Dehiscence of amputation stump ([place]) 08/29/2017  . Infection of amputation stump, left lower extremity ([place]) 06/19/2017  . Bradycardia 05/07/2017  . Depression 05/07/2017  . Lymphedema 05/07/2017  . Charcot foot due to diabetes mellitus ([place]) 02/09/2017  . 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 Post-menopausal bleeding 04/15/2014  . Endometrial polyp 04/09/2014  . Ventral hernia 03/06/2012  . Open abdominal wall wound 03/06/2012  . Hyperlipemia 11/12/2008   . OBESITY, MORBID 11/12/2008  . Essential hypertension 11/12/2008  . CAD (coronary artery disease) 11/12/2008    Jamey Reas PT, DPT 02/13/2018, 12:25 PM  [facility] [address] [place], [place], [address] Phone: [phone]   Fax:  [phone]  Name: Julia West MRN: [id number] Date of Birth: Jun 13, 1955

## 2018-02-13 NOTE — Progress Notes (Signed)
Office Visit Note   Patient: Julia West           Date of Birth: 1956-03-15           MRN: [id number] Visit Date: 02/13/2018              Requested by: [name], MD [place], [place] [address] PCP: [name], MD  Chief Complaint  Patient presents with  . Left Leg - Follow-up    08/31/17 revision left BKA       HPI: Patient presents in follow-up status post revision left transtibial amputation.  She has her prosthesis fabricated by [employer] she is currently working with Shirlean Mylar for gait training she ambulates with a power wheelchair.  Assessment & Plan: Visit Diagnoses:  1. 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  Follow-Up Instructions: Return in about 2 months (around 04/16/2018).   Ortho Exam  Patient is alert, oriented, no adenopathy, well-dressed, normal affect, normal respiratory effort. Examination patient has no open wounds no blisters there is no cellulitis she has good hair growth down her leg there is some redness over the residual limb but this appears to be from pressure and not infection there is no tenderness to palpation.  Imaging: No results found. No images are attached to the encounter.  Labs: Lab Results  Component Value Date   HGBA1C 5.4 05/09/2017   HGBA1C 5.6 02/07/2017   ESRSEDRATE 84 (H) 09/05/2017   CRP 7.9 (H) 09/05/2017   REPTSTATUS 09/10/2017 FINAL 09/05/2017   GRAMSTAIN  04/24/2012    NO WBC SEEN NO SQUAMOUS EPITHELIAL CELLS SEEN MODERATE GRAM POSITIVE COCCI IN PAIRS RARE GRAM NEGATIVE RODS   GRAMSTAIN  04/24/2012    NO WBC SEEN NO SQUAMOUS EPITHELIAL CELLS SEEN MODERATE GRAM POSITIVE COCCI IN PAIRS RARE GRAM NEGATIVE RODS   CULT  09/05/2017    NO GROWTH 5 DAYS Performed at [facility], [place] [address], [place], [place] [address]    [place] 04/24/2012     Lab Results  Component Value Date   ALBUMIN 4.0 08/24/2017   ALBUMIN 4.6 12/10/2016   ALBUMIN 4.4 08/20/2015    Body mass index is 41.8 kg/m.  Orders:  No orders of the defined types were placed in this encounter.  No orders of the defined types were placed in this encounter.    Procedures: No procedures performed  Clinical Data: No additional findings.  ROS:  All other systems negative, except as noted in the HPI. Review of Systems  Objective: Vital Signs: Ht 5\' 6"  (1.676 m)   Wt 259 lb (117.5 kg)   BMI 41.80 kg/m   Specialty Comments:  No specialty comments available.  PMFS History: Patient Active Problem List   Diagnosis Date Noted  . Dyslipidemia 11/23/2017  . Below knee amputation status, left 08/31/2017  . Dehiscence of amputation stump ([place]) 08/29/2017  . Infection of amputation stump, left lower extremity ([place]) 06/19/2017  . Bradycardia 05/07/2017  . Depression 05/07/2017  . Lymphedema 05/07/2017  . Charcot foot due to diabetes mellitus ([place]) 02/09/2017  . 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 Post-menopausal bleeding 04/15/2014  . Endometrial polyp 04/09/2014  . Ventral hernia 03/06/2012  . Open abdominal wall wound 03/06/2012  . Hyperlipemia 11/12/2008  . OBESITY,  MORBID 11/12/2008  . Essential hypertension 11/12/2008  . CAD (coronary artery disease) 11/12/2008   Past Medical History:  Diagnosis Date  . Acute myocardial infarction    with a ruptured plaque in the circumflex in 2003  . Anxiety   . 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 Complication of anesthesia    states low O2 sats post-op 11/13, always slow to awaken  . Degenerative joint disease   . 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 Dyspnea    with activity- car to house - steps  . HTN (hypertension)   . Hyperlipidemia LDL goal <70   . Internal and external hemorrhoids without complication   . Lymphedema     right side of body  . Lymphedema of arm    right  . Morbid obesity ([place])   . Surgical wound, non healing ABDOMINAL    has wound vac @ 125 mm Hg    Family History  Problem Relation Age of Onset  . Heart attack Mother   . Hypertension Sister   . Diabetes Brother   . Birth defects Son        heart defect    Past Surgical History:  Procedure Laterality Date  . AMPUTATION Left 05/06/2017   Procedure: BELOW KNEE AMPUTATION;  Surgeon: Newt Minion, MD;  Location: [facility];  Service: Orthopedics;  Laterality: Left;  . APPLICATION OF WOUND VAC    . BOWEL RESECTION  02/07/2012   Procedure: SMALL BOWEL RESECTION;  Surgeon: Gayland Curry, MD,FACS;  Location: [facility];  Service: General;;  . CARDIOVASCULAR STRESS TEST  01-17-2012  DR HOCHREIN   LOW RISK NUCLEAR TEST  . CESAREAN SECTION     x 4 in remote past  . CHOLECYSTECTOMY    . CORONARY ARTERY BYPASS GRAFT  2003   by Dr. Servando Snare. LIMA to the LAD, free RIMA to the circumflex. Stress perfusion study December 2009 with no high-risk areas of ischemia. She has a well-preserved ejection fraction  . HYSTEROSCOPY W/D&C N/A 05/14/2014   Procedure: DILATATION AND CURETTAGE (no specimen); HYSTEROSCOPY;  Surgeon: Jonnie Kind, MD;  Location: [facility];  Service: Gynecology;  Laterality: N/A;  . I&D EXTREMITY Left 06/21/2017   Procedure: IRRIGATION AND DEBRIDEMENT OF LEFT LEG AMPUTATION SITE;  Surgeon: Wallace Going, DO;  [facility]: [facility];  Service: Plastics;  Laterality: Left;  . INCISION AND DRAINAGE OF WOUND  04/17/2012   Procedure: IRRIGATION AND DEBRIDEMENT WOUND;  Surgeon: Theodoro Kos, DO;  Location: [facility];  Service: Plastics;  Laterality: N/A;  OF ABDOMINAL WOUND, SURGICAL PREP AND PLACEMENT OF VAC, REMOVAL FOREHEAD SKIN LESION  . INCISION AND DRAINAGE OF WOUND N/A 04/24/2012   Procedure: IRRIGATION AND DEBRIDEMENT WOUND;  Surgeon: Theodoro Kos, DO;  Location: [facility];  Service: Plastics;  Laterality: N/A;   I & D ABDOMINAL WOUND WITH VAC AND ACELL  . INCISION AND DRAINAGE OF WOUND N/A 05/01/2012   Procedure: IRRIGATION AND DEBRIDEMENT WOUND;  Surgeon: Theodoro Kos, DO;  Location: [facility];  Service: Plastics;  Laterality: N/A;  WITH SURGICAL PREP AND PLACEMENT OF VAC  . INCISION AND DRAINAGE OF WOUND N/A 05/08/2012   Procedure: IRRIGATION AND DEBRIDEMENT OF ABD WOUND SURGICAL PREP AND PLACEMENT OF VAC ;  Surgeon: Theodoro Kos, DO;  Location: [facility];  Service: Plastics;  Laterality: N/A;  IRRIGATION AND DEBRIDEMENT OF ABD WOUND SURGICAL PREP AND PLACEMENT OF  VAC   . INCISION AND DRAINAGE OF WOUND N/A 05/15/2012   Procedure: IRRIGATION AND DEBRIDEMENT OF ABDOMINAL WOUND WITH POSSIBLE SURGICAL PREP AND PLACEMENT OF VAC;  Surgeon: Theodoro Kos, DO;  Location: [facility];  Service: Plastics;  Laterality: N/A;  . INCISION AND DRAINAGE OF WOUND N/A 05/22/2012   Procedure: IRRIGATION AND DEBRIDEMENT OF ABODOMINAL WOUND WITH  SURGICAL PREP AND VAC PLACEMENT;  Surgeon: Theodoro Kos, DO;  Location: [facility];  Service: Plastics;  Laterality: N/A;  . INCISION AND DRAINAGE OF WOUND N/A 06/28/2012   Procedure: IRRIGATION AND DEBRIDEMENT OF ABDOMINAL ULCER SURGICAL PREP AND PLACEMENT OF ACELL AND VAC;  Surgeon: Theodoro Kos, DO;  Location: [facility];  Service: Plastics;  Laterality: N/A;  . INCISIONAL HERNIA REPAIR  02/07/2012   Procedure: HERNIA REPAIR INCISIONAL;  Surgeon: Gayland Curry, MD,FACS;  Location: [facility];  Service: General;;  Open, Primary repair, strangulated Incisional hernia.  Marland Kitchen LESION REMOVAL  04/17/2012   Procedure: LESION REMOVAL;  Surgeon: Theodoro Kos, DO;  Location: [facility];  Service: Plastics;  Laterality: N/A;   CENTER OF FOREHEAD, REMOVAL FORHEAD SKIN LESION  . ORIF TOE FRACTURE Left 02/09/2017   Procedure: EXCISION TALAR HEAD, INTERNAL FIXATION MEDIAL COLUMN LEFT FOOT;  Surgeon: Newt Minion, MD;  Location: [facility];  Service: Orthopedics;  Laterality: Left;  . POLYPECTOMY N/A 05/14/2014   Procedure: ENDOMETRIAL POLYPECTOMY;  Surgeon: Jonnie Kind, MD;  [facility];  Service: Gynecology;  Laterality: N/A;  . STUMP REVISION Left 08/31/2017   Procedure: REVISION LEFT BELOW KNEE AMPUTATION;  Surgeon: Newt Minion, MD;  Location: [facility];  Service: Orthopedics;  Laterality: Left;   Social History   Occupational History  . Occupation: Estate manager/land agent    Comment: In the [employer]  Tobacco Use  . Smoking status: Former Smoker    Packs/day: 1.00    Years: 25.00    Pack years: 25.00    Types: Cigarettes    Last attempt to quit: 11/17/2011    Years since quitting: 6.2  . Smokeless tobacco: Never Used  Substance and Sexual Activity  . Alcohol use: No  . Drug use: No  . Sexual activity: Yes    Birth control/protection: Post-menopausal

## 2018-02-17 ENCOUNTER — Encounter: Payer: Self-pay | Admitting: Physical Therapy

## 2018-02-17 ENCOUNTER — Ambulatory Visit: Payer: Medicare Other | Admitting: Physical Therapy

## 2018-02-17 VITALS — HR 79

## 2018-02-17 DIAGNOSIS — R293 Abnormal posture: Secondary | ICD-10-CM

## 2018-02-17 DIAGNOSIS — M25661 Stiffness of right knee, not elsewhere classified: Secondary | ICD-10-CM | POA: Diagnosis not present

## 2018-02-17 DIAGNOSIS — R2681 Unsteadiness on feet: Secondary | ICD-10-CM

## 2018-02-17 DIAGNOSIS — M6281 Muscle weakness (generalized): Secondary | ICD-10-CM

## 2018-02-17 DIAGNOSIS — M25662 Stiffness of left knee, not elsewhere classified: Secondary | ICD-10-CM | POA: Diagnosis not present

## 2018-02-17 DIAGNOSIS — R2689 Other abnormalities of gait and mobility: Secondary | ICD-10-CM

## 2018-02-17 NOTE — Therapy (Signed)
[facility] [address] [place], [place], [address] Phone: [phone]   Fax:  [phone]  Physical Therapy Treatment  Patient Details  Name: Julia West MRN: [id number] Date of Birth: 1955-03-21 Referring Provider (PT): Meridee Score, MD   Encounter Date: 02/17/2018  PT End of Session - 02/17/18 0908    Visit Number  2    Number of Visits  26    Date for PT Re-Evaluation  05/12/18    Authorization Type  Medicare & Medicaid     PT Start Time  0802    PT Stop Time  0846    PT Time Calculation (min)  44 min    Equipment Utilized During Treatment  Gait belt    Activity Tolerance  Patient tolerated treatment well;Patient limited by fatigue    Behavior During Therapy  [facility] for tasks assessed/performed       Past Medical History:  Diagnosis Date  . Acute myocardial infarction    with a ruptured plaque in the circumflex in 2003  . Anxiety   . 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 Complication of anesthesia    states low O2 sats post-op 11/13, always slow to awaken  . Degenerative joint disease   . 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 Dyspnea    with activity- car to house - steps  . HTN (hypertension)   . Hyperlipidemia LDL goal <70   . Internal and external hemorrhoids without complication   . Lymphedema    right side of body  . Lymphedema of arm    right  . Morbid obesity ([place])   . Surgical wound, non healing ABDOMINAL    has wound vac @ 125 mm Hg    Past Surgical History:  Procedure Laterality Date  . AMPUTATION Left 05/06/2017   Procedure: BELOW KNEE AMPUTATION;  Surgeon: Newt Minion, MD;  Location: [facility];  Service: Orthopedics;  Laterality: Left;  . APPLICATION OF WOUND VAC    . BOWEL RESECTION  02/07/2012   Procedure: SMALL BOWEL RESECTION;  Surgeon: Gayland Curry, MD,FACS;  Location: [facility];  Service: General;;  . CARDIOVASCULAR STRESS TEST  01-17-2012  DR HOCHREIN   LOW RISK  NUCLEAR TEST  . CESAREAN SECTION     x 4 in remote past  . CHOLECYSTECTOMY    . CORONARY ARTERY BYPASS GRAFT  2003   by Dr. Servando Snare. LIMA to the LAD, free RIMA to the circumflex. Stress perfusion study December 2009 with no high-risk areas of ischemia. She has a well-preserved ejection fraction  . HYSTEROSCOPY W/D&C N/A 05/14/2014   Procedure: DILATATION AND CURETTAGE (no specimen); HYSTEROSCOPY;  Surgeon: Jonnie Kind, MD;  [facility];  Service: Gynecology;  Laterality: N/A;  . I&D EXTREMITY Left 06/21/2017   Procedure: IRRIGATION AND DEBRIDEMENT OF LEFT LEG AMPUTATION SITE;  Surgeon: Wallace Going, DO;  [facility];  Service: Plastics;  Laterality: Left;  . INCISION AND DRAINAGE OF WOUND  04/17/2012   Procedure: IRRIGATION AND DEBRIDEMENT WOUND;  Surgeon: Theodoro Kos, DO;  Location: [facility];  Service: Plastics;  Laterality: N/A;  OF ABDOMINAL WOUND, SURGICAL PREP AND PLACEMENT OF VAC, REMOVAL FOREHEAD SKIN LESION  . INCISION AND DRAINAGE OF WOUND N/A 04/24/2012   Procedure: IRRIGATION AND DEBRIDEMENT WOUND;  Surgeon: Theodoro Kos, DO;  Location: [facility];  Service: Plastics;  Laterality: N/A;  I & D ABDOMINAL  WOUND WITH VAC AND ACELL  . INCISION AND DRAINAGE OF WOUND N/A 05/01/2012   Procedure: IRRIGATION AND DEBRIDEMENT WOUND;  Surgeon: Theodoro Kos, DO;  Location: [facility];  Service: Plastics;  Laterality: N/A;  WITH SURGICAL PREP AND PLACEMENT OF VAC  . INCISION AND DRAINAGE OF WOUND N/A 05/08/2012   Procedure: IRRIGATION AND DEBRIDEMENT OF ABD WOUND SURGICAL PREP AND PLACEMENT OF VAC ;  Surgeon: Theodoro Kos, DO;  Location: [facility];  Service: Plastics;  Laterality: N/A;  IRRIGATION AND DEBRIDEMENT OF ABD WOUND SURGICAL PREP AND PLACEMENT OF VAC   . INCISION AND DRAINAGE OF WOUND N/A 05/15/2012   Procedure: IRRIGATION AND DEBRIDEMENT OF ABDOMINAL WOUND WITH POSSIBLE SURGICAL PREP AND PLACEMENT OF VAC;   Surgeon: Theodoro Kos, DO;  Location: [facility];  Service: Plastics;  Laterality: N/A;  . INCISION AND DRAINAGE OF WOUND N/A 05/22/2012   Procedure: IRRIGATION AND DEBRIDEMENT OF ABODOMINAL WOUND WITH  SURGICAL PREP AND VAC PLACEMENT;  Surgeon: Theodoro Kos, DO;  Location: [facility];  Service: Plastics;  Laterality: N/A;  . INCISION AND DRAINAGE OF WOUND N/A 06/28/2012   Procedure: IRRIGATION AND DEBRIDEMENT OF ABDOMINAL ULCER SURGICAL PREP AND PLACEMENT OF ACELL AND VAC;  Surgeon: Theodoro Kos, DO;  Location: [facility];  Service: Plastics;  Laterality: N/A;  . INCISIONAL HERNIA REPAIR  02/07/2012   Procedure: HERNIA REPAIR INCISIONAL;  Surgeon: Gayland Curry, MD,FACS;  Location: [facility];  Service: General;;  Open, Primary repair, strangulated Incisional hernia.  Marland Kitchen LESION REMOVAL  04/17/2012   Procedure: LESION REMOVAL;  Surgeon: Theodoro Kos, DO;  Location: [facility];  Service: Plastics;  Laterality: N/A;   CENTER OF FOREHEAD, REMOVAL FORHEAD SKIN LESION  . ORIF TOE FRACTURE Left 02/09/2017   Procedure: EXCISION TALAR HEAD, INTERNAL FIXATION MEDIAL COLUMN LEFT FOOT;  Surgeon: Newt Minion, MD;  Location: [facility];  Service: Orthopedics;  Laterality: Left;  . POLYPECTOMY N/A 05/14/2014   Procedure: ENDOMETRIAL POLYPECTOMY;  Surgeon: Jonnie Kind, MD;  Location[facility];  Service: Gynecology;  Laterality: N/A;  . STUMP REVISION Left 08/31/2017   Procedure: REVISION LEFT BELOW KNEE AMPUTATION;  Surgeon: Newt Minion, MD;  Location: [facility];  Service: Orthopedics;  Laterality: Left;    Vitals:   02/17/18 0917  Pulse: 79  SpO2: 96%    Subjective Assessment - 02/17/18 0804    Subjective  Has been wearing prosthesis 2hr/day twice a day. Using cocoa butter at night and washing off in the morning before donning.     Patient is accompained by:  Family member   husband who is deaf   Pertinent History  L TTA, bradycardia, depression,  lymphedema, DM2, HLD, HTN, CAD, MI, CABG, CA basal cell face, bowel resection,     Limitations  Lifting;Standing;Walking;House hold activities    Patient Stated Goals  To use prosthesis to get in/out cars, bathrooms, access community & home    Currently in Pain?  No/denies             [facility] Adult PT Treatment/Exercise - 02/17/18 0859      Transfers   Transfers  Sit to Stand;Stand to Sit    Sit to Stand  4: Min assist;3: Mod assist;With upper extremity assist;From chair/3-in-1   mod A needed with increased fatigue   Sit to Stand Details (indicate cue type and reason)  verbal cues to push up from chair rather than RW    Stand  to Sit  4: Min guard;With upper extremity assist;To chair/3-in-1      Ambulation/Gait   Ambulation/Gait  Yes    Ambulation/Gait Assistance  4: Min assist    Ambulation/Gait Assistance Details  Excessive UE weght bearing on RW. verbal cues for upright posture. Pt experienced increased pressure/pain on lateral aspect of residual limb corrected with appilication of 1 ply sock. Pt required multiple rest breaks.     Ambulation Distance (Feet)  128 Feet total.  38ft; 62ft; 62ft   Assistive device  Rolling walker    Gait Pattern  Step-to pattern;Decreased step length - right;Decreased stance time - left;Decreased stride length;Decreased hip/knee flexion - left;Decreased weight shift to left;Left hip hike;Right flexed knee in stance;Left flexed knee in stance;Antalgic;Lateral hip instability;Trunk flexed;Abducted - left;Poor foot clearance - left    Ambulation Surface  Level;Indoor      Prosthetics   Prosthetic Care Comments   Pt instructed to increase wear to 3hrs 2x/day on monday (02/20/18) with no skin issues.     Current prosthetic wear tolerance (days/week)   wearing everday since last appointment    Current prosthetic wear tolerance (#hours/day)   2hrs 2x/day    Residual limb condition   scabs dry on incision, redness distally, dry skin, normal temperature,  cylinderical shape with excessive soft tissue distal to tibia    Education Provided  Skin check;Residual limb care;Prosthetic cleaning;Correct ply sock adjustment;Proper wear schedule/adjustment;Proper weight-bearing schedule/adjustment    Person(s) Educated  Patient;Spouse    Education Method  Explanation;Demonstration;Verbal cues    Education Method  Verbalized understanding;Needs further instruction    Donning Prosthesis  Moderate assist    Doffing Prosthesis  Minimal assist             PT Education - 02/17/18 0906    Education Details  Initiated sink HEP (see pt instructions). Recommended to call [name] and set up appointment after Friday's visit.     Person(s) Educated  Patient;Spouse    Methods  Explanation;Demonstration;Verbal cues;Tactile cues;Handout    Comprehension  Verbalized understanding;Need further instruction;Returned demonstration;Verbal cues required       PT Short Term Goals - 02/13/18 1218      PT SHORT TERM GOAL #1   Title  Patient verbalizes proper cleaning & demonstrates proper donning of prosthesis. (All STGs Target Date: 03/14/2018)     NEED TO SET NEW STGs after these are checked   Time  1    Period  Months    Status  New    Target Date  03/14/18      PT SHORT TERM GOAL #2   Title  Patient tolerates prosthesis wear >8 hrs total / day without skin issues.     Time  1    Period  Months    Status  New    Target Date  03/14/18      PT SHORT TERM GOAL #3   Title  Patient performs standing balance with RW including reaching 10", picking up items from floor and managing clothes with supervision.     Time  1    Period  Months    Status  New    Target Date  03/14/18      PT SHORT TERM GOAL #4   Title  Patient ambulates 100' with RW & prosthesis with supervision.     Time  1    Period  Months    Status  New    Target Date  03/14/18      PT  SHORT TERM GOAL #5   Title  Patient negotiates ramps & curbs with RW & prosthesis with minA.     Time  1     Period  Months    Status  New    Target Date  03/14/18        PT Long Term Goals - 02/13/18 0927      PT LONG TERM GOAL #1   Title  Patient demonstrates and verbalizes proper prosthetic care to enable safe use of prosthesis (All LTGs Target Date: 05/12/2018)    Time  3    Period  Months    Status  New    Target Date  05/12/18      PT LONG TERM GOAL #2   Title  Patient tolerates wear of prosthesis >90% of awake hours without skin or limb pain issues to enable function throughout her day.     Time  3    Period  Months    Status  New    Target Date  05/12/18      PT LONG TERM GOAL #3   Title  Lillie Fragmin >36/56    Time  3    Period  Months    Status  New    Target Date  05/12/18      PT LONG TERM GOAL #4   Title  Patient ambulates 300' with LRAD & prosthesis modified independent for basic community mobility.     Time  3    Period  Months    Status  New    Target Date  05/12/18      PT LONG TERM GOAL #5   Title  Patient negotiates ramps, curbs & stairs with LRAD & prosthesis modified independent for community access.     Time  3    Period  Months    Status  New    Target Date  05/12/18            Plan - 02/17/18 0908    Clinical Impression Statement  Today's treamtnet focused on initiating and performing sink HEP and gait training with RW and prosthesis. Pt requires frequent rest breaks during ambulation with noted muscular fatigue. Pt experienced increase pressure/pain while ambulating which was resolved with application of 1 ply sock. Pt would benefit from futher PT to improve independence and functional mobility with prosthesis.      Rehab Potential  Good    PT Frequency  2x / week    PT Duration  Other (comment)   13 weeks (90 days)   PT Treatment/Interventions  ADLs/Self Care Home Management;DME Instruction;Gait training;Stair training;Functional mobility training;Therapeutic activities;Therapeutic exercise;Balance training;Neuromuscular  re-education;Patient/family education;Prosthetic Training;Manual techniques;Vestibular    PT Next Visit Plan  review prosthetic care, prosthetic gait with RW, review sink HEP    Consulted and Agree with Plan of Care  Patient;Family member/caregiver    Family Member Consulted  husband who is deaf       Patient will benefit from skilled therapeutic intervention in order to improve the following deficits and impairments:  Abnormal gait, Decreased activity tolerance, Decreased balance, Cardiopulmonary status limiting activity, Decreased coordination, Decreased endurance, Decreased knowledge of use of DME, Decreased mobility, Decreased range of motion, Decreased skin integrity, Decreased strength, Dizziness, Impaired flexibility, Increased edema, Obesity, Prosthetic Dependency, Postural dysfunction  Visit Diagnosis: Other abnormalities of gait and mobility  Unsteadiness on feet  Abnormal posture  Muscle weakness (generalized)     Problem List Patient Active Problem List   Diagnosis  Date Noted  . Dyslipidemia 11/23/2017  . Below knee amputation status, left 08/31/2017  . Dehiscence of amputation stump ([place]) 08/29/2017  . Infection of amputation stump, left lower extremity ([place]) 06/19/2017  . Bradycardia 05/07/2017  . Depression 05/07/2017  . Lymphedema 05/07/2017  . 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 Post-menopausal bleeding 04/15/2014  . Endometrial polyp 04/09/2014  . Ventral hernia 03/06/2012  . Open abdominal wall wound 03/06/2012  . Hyperlipemia 11/12/2008  . OBESITY, MORBID 11/12/2008  . Essential hypertension 11/12/2008  . CAD (coronary artery disease) 11/12/2008    Cecile Sheerer, SPTA 02/17/2018, 9:19 AM  [facility] [address] [place], [place], [address] Phone: [phone]   Fax:  [phone]  Name: SHERLE MELLO MRN: [id number] Date of Birth: 1955/06/17

## 2018-02-17 NOTE — Patient Instructions (Signed)
Do each exercise 2-3  times per day Do each exercise 10 repetitions Hold each exercise for 3 seconds to feel your location  AT [place].  USE TAPE ON FLOOR TO MARK THE MIDLINE POSITION. You also should try to feel with your limb pressure in socket.  You are trying to feel with limb what you used to feel with the bottom of your foot.  1. Side to Side Shift: Moving your hips only (not shoulders): move weight onto your left leg, HOLD/FEEL.  Move back to equal weight on each leg, HOLD/FEEL. Move weight onto your right leg, HOLD/FEEL. Move back to equal weight on each leg, HOLD/FEEL. Repeat. 2. Front to Back Shift: Moving your hips only (not shoulders): move your weight forward onto your toes, HOLD/FEEL. Move your weight back to equal Flat Foot on both legs, HOLD/FEEL. Move your weight back onto your heels, HOLD/FEEL. Move your weight back to equal on both legs, HOLD/FEEL. Repeat. 3. Moving Cones / Cups: With equal weight on each leg: Hold on with one hand the first time, then progress to no hand supports. Move cups from one side of sink to the other. Place cups ~2" out of your reach, progress to 10" beyond reach. 4. Overhead/Upward Reaching: alternated reaching up to top cabinets or ceiling if no cabinets present. Keep equal weight on each leg. Start with one hand support on counter while other hand reaches and progress to no hand support with reaching. 5.   Looking Over Shoulders: With equal weight on each leg: alternate turning to look          over your shoulders with one hand support on counter as needed. Shift weight to             side looking, pull hip then shoulder then head/eyes around to look behind you. Start       with one hand support & progress to no hand support.

## 2018-02-20 ENCOUNTER — Ambulatory Visit: Payer: Medicare Other | Admitting: Physical Therapy

## 2018-02-20 ENCOUNTER — Encounter: Payer: Self-pay | Admitting: Physical Therapy

## 2018-02-20 DIAGNOSIS — M25662 Stiffness of left knee, not elsewhere classified: Secondary | ICD-10-CM | POA: Diagnosis not present

## 2018-02-20 DIAGNOSIS — R2681 Unsteadiness on feet: Secondary | ICD-10-CM | POA: Diagnosis not present

## 2018-02-20 DIAGNOSIS — M25661 Stiffness of right knee, not elsewhere classified: Secondary | ICD-10-CM | POA: Diagnosis not present

## 2018-02-20 DIAGNOSIS — R2689 Other abnormalities of gait and mobility: Secondary | ICD-10-CM | POA: Diagnosis not present

## 2018-02-20 DIAGNOSIS — R293 Abnormal posture: Secondary | ICD-10-CM

## 2018-02-20 DIAGNOSIS — M6281 Muscle weakness (generalized): Secondary | ICD-10-CM | POA: Diagnosis not present

## 2018-02-20 NOTE — Therapy (Signed)
[facility] [address] [place], [place], [address] Phone: [phone]   Fax:  [phone]  Physical Therapy Treatment  Patient Details  Name: Julia West MRN: [id number] Date of Birth: 06/23/1955 Referring Provider (PT): Meridee Score, MD   Encounter Date: 02/20/2018  PT End of Session - 02/20/18 0935    Visit Number  3    Number of Visits  26    Date for PT Re-Evaluation  05/12/18    Authorization Type  Medicare & Medicaid     PT Start Time  0845    PT Stop Time  0933    PT Time Calculation (min)  48 min    Equipment Utilized During Treatment  Gait belt    Activity Tolerance  Patient tolerated treatment well;Patient limited by fatigue    Behavior During Therapy  [facility] for tasks assessed/performed       Past Medical History:  Diagnosis Date  . Acute myocardial infarction    with a ruptured plaque in the circumflex in 2003  . Anxiety   . 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 Complication of anesthesia    states low O2 sats post-op 11/13, always slow to awaken  . Degenerative joint disease   . 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 Dyspnea    with activity- car to house - steps  . HTN (hypertension)   . Hyperlipidemia LDL goal <70   . Internal and external hemorrhoids without complication   . Lymphedema    right side of body  . Lymphedema of arm    right  . Morbid obesity ([place])   . Surgical wound, non healing ABDOMINAL    has wound vac @ 125 mm Hg    Past Surgical History:  Procedure Laterality Date  . AMPUTATION Left 05/06/2017   Procedure: BELOW KNEE AMPUTATION;  Surgeon: Newt Minion, MD;  Location: [facility];  Service: Orthopedics;  Laterality: Left;  . APPLICATION OF WOUND VAC    . BOWEL RESECTION  02/07/2012   Procedure: SMALL BOWEL RESECTION;  Surgeon: Gayland Curry, MD,FACS;  Location: [facility];  Service: General;;  . CARDIOVASCULAR STRESS TEST  01-17-2012  DR HOCHREIN   LOW RISK  NUCLEAR TEST  . CESAREAN SECTION     x 4 in remote past  . CHOLECYSTECTOMY    . CORONARY ARTERY BYPASS GRAFT  2003   by Dr. Servando Snare. LIMA to the LAD, free RIMA to the circumflex. Stress perfusion study December 2009 with no high-risk areas of ischemia. She has a well-preserved ejection fraction  . HYSTEROSCOPY W/D&C N/A 05/14/2014   Procedure: DILATATION AND CURETTAGE (no specimen); HYSTEROSCOPY;  Surgeon: Jonnie Kind, MD;  Location: [facility];  Service: Gynecology;  Laterality: N/A;  . I&D EXTREMITY Left 06/21/2017   Procedure: IRRIGATION AND DEBRIDEMENT OF LEFT LEG AMPUTATION SITE;  Surgeon: Wallace Going, DO;  Location: [facility];  Service: Plastics;  Laterality: Left;  . INCISION AND DRAINAGE OF WOUND  04/17/2012   Procedure: IRRIGATION AND DEBRIDEMENT WOUND;  Surgeon: Theodoro Kos, DO;  Location: [facility];  Service: Plastics;  Laterality: N/A;  OF ABDOMINAL WOUND, SURGICAL PREP AND PLACEMENT OF VAC, REMOVAL FOREHEAD SKIN LESION  . INCISION AND DRAINAGE OF WOUND N/A 04/24/2012   Procedure: IRRIGATION AND DEBRIDEMENT WOUND;  Surgeon: Theodoro Kos, DO;  Location: [facility];  Service: Plastics;  Laterality: N/A;  I & D ABDOMINAL  WOUND WITH VAC AND ACELL  . INCISION AND DRAINAGE OF WOUND N/A 05/01/2012   Procedure: IRRIGATION AND DEBRIDEMENT WOUND;  Surgeon: Theodoro Kos, DO;  Location: [facility];  Service: Plastics;  Laterality: N/A;  WITH SURGICAL PREP AND PLACEMENT OF VAC  . INCISION AND DRAINAGE OF WOUND N/A 05/08/2012   Procedure: IRRIGATION AND DEBRIDEMENT OF ABD WOUND SURGICAL PREP AND PLACEMENT OF VAC ;  Surgeon: Theodoro Kos, DO;  Location: [facility];  Service: Plastics;  Laterality: N/A;  IRRIGATION AND DEBRIDEMENT OF ABD WOUND SURGICAL PREP AND PLACEMENT OF VAC   . INCISION AND DRAINAGE OF WOUND N/A 05/15/2012   Procedure: IRRIGATION AND DEBRIDEMENT OF ABDOMINAL WOUND WITH POSSIBLE SURGICAL PREP AND PLACEMENT OF VAC;   Surgeon: Theodoro Kos, DO;  Location: [facility];  Service: Plastics;  Laterality: N/A;  . INCISION AND DRAINAGE OF WOUND N/A 05/22/2012   Procedure: IRRIGATION AND DEBRIDEMENT OF ABODOMINAL WOUND WITH  SURGICAL PREP AND VAC PLACEMENT;  Surgeon: Theodoro Kos, DO;  Location: [facility];  Service: Plastics;  Laterality: N/A;  . INCISION AND DRAINAGE OF WOUND N/A 06/28/2012   Procedure: IRRIGATION AND DEBRIDEMENT OF ABDOMINAL ULCER SURGICAL PREP AND PLACEMENT OF ACELL AND VAC;  Surgeon: Theodoro Kos, DO;  Location: [facility];  Service: Plastics;  Laterality: N/A;  . INCISIONAL HERNIA REPAIR  02/07/2012   Procedure: HERNIA REPAIR INCISIONAL;  Surgeon: Gayland Curry, MD,FACS;  Location: [facility];  Service: General;;  Open, Primary repair, strangulated Incisional hernia.  Marland Kitchen LESION REMOVAL  04/17/2012   Procedure: LESION REMOVAL;  Surgeon: Theodoro Kos, DO;  Location: [facility];  Service: Plastics;  Laterality: N/A;   CENTER OF FOREHEAD, REMOVAL FORHEAD SKIN LESION  . ORIF TOE FRACTURE Left 02/09/2017   Procedure: EXCISION TALAR HEAD, INTERNAL FIXATION MEDIAL COLUMN LEFT FOOT;  Surgeon: Newt Minion, MD;  Location: [facility];  Service: Orthopedics;  Laterality: Left;  . POLYPECTOMY N/A 05/14/2014   Procedure: ENDOMETRIAL POLYPECTOMY;  Surgeon: Jonnie Kind, MD;  Location: [facility];  Service: Gynecology;  Laterality: N/A;  . STUMP REVISION Left 08/31/2017   Procedure: REVISION LEFT BELOW KNEE AMPUTATION;  Surgeon: Newt Minion, MD;  Location: [facility];  Service: Orthopedics;  Laterality: Left;    There were no vitals filed for this visit.  Subjective Assessment - 02/20/18 0849    Subjective  No fall; has worked on standing activities and walked short distances with RW and prosthesis at home.     Patient is accompained by:  Family member   husband who is deaf   Pertinent History  L TTA, bradycardia, depression, lymphedema, DM2, HLD, HTN, CAD,  MI, CABG, CA basal cell face, bowel resection,     Limitations  Lifting;Standing;Walking;House hold activities    Patient Stated Goals  To use prosthesis to get in/out cars, bathrooms, access community & home                       [facility] Adult PT Treatment/Exercise - 02/20/18 0001      Transfers   Transfers  Sit to Stand;Stand to Sit    Sit to Stand  4: Min guard    Stand to Sit  4: Min guard   vc's for posture when turning to sit.     Ambulation/Gait   Ambulation/Gait  Yes    Ambulation/Gait Assistance  4: Min assist    Ambulation/Gait Assistance Details  cues for  sequence, RW control    Ambulation Distance (Feet)  40 Feet    Assistive device  Rolling walker    Gait Pattern  Step-to pattern;Decreased step length - right;Decreased stance time - left;Decreased stride length;Decreased hip/knee flexion - left;Decreased weight shift to left;Left hip hike;Right flexed knee in stance;Left flexed knee in stance;Antalgic;Lateral hip instability;Trunk flexed;Abducted - left;Poor foot clearance - left    Ambulation Surface  Level;Indoor    Pre-Gait Activities  in parallel bars, instructing on steplength, weight shifting, and posture      Prosthetics   Prosthetic Care Comments   Pt will increase wear time 3hrs/ 2x/day    Current prosthetic wear tolerance (days/week)   wearing everday since last appointment    Current prosthetic wear tolerance (#hours/day)   2hrs 2x/day    Edema  pitting    Residual limb condition   scabs dry on incision, redness distally, dry skin, normal temperature, cylinderical shape with excessive soft tissue distal to tibia    Education Provided  Skin check;Residual limb care;Prosthetic cleaning;Correct ply sock adjustment;Proper wear schedule/adjustment;Proper weight-bearing schedule/adjustment    Person(s) Educated  Patient;Spouse    Education Method  Explanation;Demonstration;Tactile cues;Verbal cues    Education Method  Verbalized understanding;Returned  demonstration          Balance Exercises - 02/20/18 0933      Balance Exercises: Standing   Turning  Left  In parallel bars cues to activate gluts for up right posture when L LE is in stance phase.   Other Standing Exercises  lateral weight shifting in parallel bars, cues for glut activation        PT Education - 02/20/18 1302    Education Details  gait mechanics, discussed process of prosthetic training.    Person(s) Educated  Patient;Spouse    Methods  Explanation;Tactile cues;Demonstration;Verbal cues    Comprehension  Verbalized understanding;Returned demonstration;Verbal cues required;Tactile cues required;Need further instruction       PT Short Term Goals - 02/13/18 1218      PT SHORT TERM GOAL #1   Title  Patient verbalizes proper cleaning & demonstrates proper donning of prosthesis. (All STGs Target Date: 03/14/2018)     NEED TO SET NEW STGs after these are checked   Time  1    Period  Months    Status  New    Target Date  03/14/18      PT SHORT TERM GOAL #2   Title  Patient tolerates prosthesis wear >8 hrs total / day without skin issues.     Time  1    Period  Months    Status  New    Target Date  03/14/18      PT SHORT TERM GOAL #3   Title  Patient performs standing balance with RW including reaching 10", picking up items from floor and managing clothes with supervision.     Time  1    Period  Months    Status  New    Target Date  03/14/18      PT SHORT TERM GOAL #4   Title  Patient ambulates 100' with RW & prosthesis with supervision.     Time  1    Period  Months    Status  New    Target Date  03/14/18      PT SHORT TERM GOAL #5   Title  Patient negotiates ramps & curbs with RW & prosthesis with minA.     Time  1  Period  Months    Status  New    Target Date  03/14/18        PT Long Term Goals - 02/13/18 0927      PT LONG TERM GOAL #1   Title  Patient demonstrates and verbalizes proper prosthetic care to enable safe use of prosthesis  (All LTGs Target Date: 05/12/2018)    Time  3    Period  Months    Status  New    Target Date  05/12/18      PT LONG TERM GOAL #2   Title  Patient tolerates wear of prosthesis >90% of awake hours without skin or limb pain issues to enable function throughout her day.     Time  3    Period  Months    Status  New    Target Date  05/12/18      PT LONG TERM GOAL #3   Title  Lillie Fragmin >36/56    Time  3    Period  Months    Status  New    Target Date  05/12/18      PT LONG TERM GOAL #4   Title  Patient ambulates 300' with LRAD & prosthesis modified independent for basic community mobility.     Time  3    Period  Months    Status  New    Target Date  05/12/18      PT LONG TERM GOAL #5   Title  Patient negotiates ramps, curbs & stairs with LRAD & prosthesis modified independent for community access.     Time  3    Period  Months    Status  New    Target Date  05/12/18            Plan - 02/20/18 1303    Clinical Impression Statement  Skilleds session focused on prosthetic care, standing balance/weight shifting onto prosthesis, and gait training with prosthesis and RW. Pt progressed well today demonstrating understanding of instruction with min cues and min A with gait.    Rehab Potential  Good    PT Frequency  2x / week    PT Duration  Other (comment)   13 weeks (90 days)   PT Treatment/Interventions  ADLs/Self Care Home Management;DME Instruction;Gait training;Stair training;Functional mobility training;Therapeutic activities;Therapeutic exercise;Balance training;Neuromuscular re-education;Patient/family education;Prosthetic Training;Manual techniques;Vestibular    PT Next Visit Plan  review prosthetic care, prosthetic gait with RW, review sink HEP    Consulted and Agree with Plan of Care  Patient;Family member/caregiver    Family Member Consulted  husband who is deaf       Patient will benefit from skilled therapeutic intervention in order to improve the following  deficits and impairments:  Abnormal gait, Decreased activity tolerance, Decreased balance, Cardiopulmonary status limiting activity, Decreased coordination, Decreased endurance, Decreased knowledge of use of DME, Decreased mobility, Decreased range of motion, Decreased skin integrity, Decreased strength, Dizziness, Impaired flexibility, Increased edema, Obesity, Prosthetic Dependency, Postural dysfunction  Visit Diagnosis: Other abnormalities of gait and mobility  Unsteadiness on feet  Abnormal posture  Muscle weakness (generalized)     Problem List Patient Active Problem List   Diagnosis Date Noted  . Dyslipidemia 11/23/2017  . Below knee amputation status, left 08/31/2017  . Dehiscence of amputation stump ([place]) 08/29/2017  . Infection of amputation stump, left lower extremity ([place]) 06/19/2017  . Bradycardia 05/07/2017  . Depression 05/07/2017  . Lymphedema 05/07/2017  . Charcot foot due to diabetes mellitus (  [place]) 02/09/2017  . 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 Post-menopausal bleeding 04/15/2014  . Endometrial polyp 04/09/2014  . Ventral hernia 03/06/2012  . Open abdominal wall wound 03/06/2012  . Hyperlipemia 11/12/2008  . OBESITY, MORBID 11/12/2008  . Essential hypertension 11/12/2008  . CAD (coronary artery disease) 11/12/2008    Bjorn Loser, PTA  02/20/18, 1:09 PM [facility] [address] [place], [place], [address] Phone: [phone]   Fax:  [phone]  Name: Julia West MRN: [id number] Date of Birth: 1956/01/17

## 2018-02-21 ENCOUNTER — Other Ambulatory Visit: Payer: Self-pay | Admitting: Family Medicine

## 2018-02-22 ENCOUNTER — Ambulatory Visit: Payer: Medicare Other | Admitting: Physical Therapy

## 2018-02-23 ENCOUNTER — Encounter: Payer: Self-pay | Admitting: Physical Therapy

## 2018-02-23 ENCOUNTER — Ambulatory Visit: Payer: Medicare Other | Admitting: Physical Therapy

## 2018-02-23 DIAGNOSIS — R2689 Other abnormalities of gait and mobility: Secondary | ICD-10-CM | POA: Diagnosis not present

## 2018-02-23 DIAGNOSIS — M25662 Stiffness of left knee, not elsewhere classified: Secondary | ICD-10-CM | POA: Diagnosis not present

## 2018-02-23 DIAGNOSIS — R293 Abnormal posture: Secondary | ICD-10-CM

## 2018-02-23 DIAGNOSIS — M6281 Muscle weakness (generalized): Secondary | ICD-10-CM

## 2018-02-23 DIAGNOSIS — R2681 Unsteadiness on feet: Secondary | ICD-10-CM | POA: Diagnosis not present

## 2018-02-23 DIAGNOSIS — M25661 Stiffness of right knee, not elsewhere classified: Secondary | ICD-10-CM

## 2018-02-23 NOTE — Therapy (Signed)
[facility] [address] [place], [place], [address] Phone: [phone]   Fax:  [phone]  Physical Therapy Treatment  Patient Details  Name: Julia West MRN: [id number] Date of Birth: [birth year] Referring Provider (PT): Meridee Score, MD   Encounter Date: 02/23/2018  PT End of Session - 02/23/18 1134    Visit Number  4    Number of Visits  26    Date for PT Re-Evaluation  05/12/18    Authorization Type  Medicare & Medicaid     PT Start Time  6767    PT Stop Time  1100    PT Time Calculation (min)  45 min    Equipment Utilized During Treatment  Gait belt    Activity Tolerance  Patient tolerated treatment well;Patient limited by fatigue    Behavior During Therapy  [facility] for tasks assessed/performed       Past Medical History:  Diagnosis Date  . Acute myocardial infarction    with a ruptured plaque in the circumflex in 2003  . Anxiety   . Cancer ([place])    basal cell face  . Complication of anesthesia    states low O2 sats post-op 11/13, always slow to awaken  . Degenerative joint disease   . 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 Dyspnea    with activity- car to house - steps  . HTN (hypertension)   . Hyperlipidemia LDL goal <70   . Internal and external hemorrhoids without complication   . Lymphedema    right side of body  . Lymphedema of arm    right  . Morbid obesity ([place])   . Surgical wound, non healing ABDOMINAL    has wound vac @ 125 mm Hg    Past Surgical History:  Procedure Laterality Date  . AMPUTATION Left 05/06/2017   Procedure: BELOW KNEE AMPUTATION;  Surgeon: Newt Minion, MD;  Location: [facility];  Service: Orthopedics;  Laterality: Left;  . APPLICATION OF WOUND VAC    . BOWEL RESECTION  02/07/2012   Procedure: SMALL BOWEL RESECTION;  Surgeon: Gayland Curry, MD,FACS;  Location: [facility];  Service: General;;  . CARDIOVASCULAR STRESS TEST  01-17-2012  DR HOCHREIN   LOW RISK  NUCLEAR TEST  . CESAREAN SECTION     x 4 in remote past  . CHOLECYSTECTOMY    . CORONARY ARTERY BYPASS GRAFT  2003   by Dr. Servando Snare. LIMA to the LAD, free RIMA to the circumflex. Stress perfusion study December 2009 with no high-risk areas of ischemia. She has a well-preserved ejection fraction  . HYSTEROSCOPY W/D&C N/A 05/14/2014   Procedure: DILATATION AND CURETTAGE (no specimen); HYSTEROSCOPY;  Surgeon: Jonnie Kind, MD;  Location: [facility];  Service: Gynecology;  Laterality: N/A;  . I&D EXTREMITY Left 06/21/2017   Procedure: IRRIGATION AND DEBRIDEMENT OF LEFT LEG AMPUTATION SITE;  Surgeon: Wallace Going, DO;  Location: [facility];  Service: Plastics;  Laterality: Left;  . INCISION AND DRAINAGE OF WOUND  04/17/2012   Procedure: IRRIGATION AND DEBRIDEMENT WOUND;  Surgeon: Theodoro Kos, DO;  Location: [facility];  Service: Plastics;  Laterality: N/A;  OF ABDOMINAL WOUND, SURGICAL PREP AND PLACEMENT OF VAC, REMOVAL FOREHEAD SKIN LESION  . INCISION AND DRAINAGE OF WOUND N/A 04/24/2012   Procedure: IRRIGATION AND DEBRIDEMENT WOUND;  Surgeon: Theodoro Kos, DO;  Location: [facility];  Service: Plastics;  Laterality: N/A;  I & D ABDOMINAL  WOUND WITH VAC AND ACELL  . INCISION AND DRAINAGE OF WOUND N/A 05/01/2012   Procedure: IRRIGATION AND DEBRIDEMENT WOUND;  Surgeon: Theodoro Kos, DO;  Location: [facility];  Service: Plastics;  Laterality: N/A;  WITH SURGICAL PREP AND PLACEMENT OF VAC  . INCISION AND DRAINAGE OF WOUND N/A 05/08/2012   Procedure: IRRIGATION AND DEBRIDEMENT OF ABD WOUND SURGICAL PREP AND PLACEMENT OF VAC ;  Surgeon: Theodoro Kos, DO;  Location: [facility];  Service: Plastics;  Laterality: N/A;  IRRIGATION AND DEBRIDEMENT OF ABD WOUND SURGICAL PREP AND PLACEMENT OF VAC   . INCISION AND DRAINAGE OF WOUND N/A 05/15/2012   Procedure: IRRIGATION AND DEBRIDEMENT OF ABDOMINAL WOUND WITH POSSIBLE SURGICAL PREP AND PLACEMENT OF VAC;   Surgeon: Theodoro Kos, DO;  Location: [facility];  Service: Plastics;  Laterality: N/A;  . INCISION AND DRAINAGE OF WOUND N/A 05/22/2012   Procedure: IRRIGATION AND DEBRIDEMENT OF ABODOMINAL WOUND WITH  SURGICAL PREP AND VAC PLACEMENT;  Surgeon: Theodoro Kos, DO;  Location: [facility];  Service: Plastics;  Laterality: N/A;  . INCISION AND DRAINAGE OF WOUND N/A 06/28/2012   Procedure: IRRIGATION AND DEBRIDEMENT OF ABDOMINAL ULCER SURGICAL PREP AND PLACEMENT OF ACELL AND VAC;  Surgeon: Theodoro Kos, DO;  Location: [facility];  Service: Plastics;  Laterality: N/A;  . INCISIONAL HERNIA REPAIR  02/07/2012   Procedure: HERNIA REPAIR INCISIONAL;  Surgeon: Gayland Curry, MD,FACS;  Location: [facility];  Service: General;;  Open, Primary repair, strangulated Incisional hernia.  Marland Kitchen LESION REMOVAL  04/17/2012   Procedure: LESION REMOVAL;  Surgeon: Theodoro Kos, DO;  Location: [facility];  Service: Plastics;  Laterality: N/A;   CENTER OF FOREHEAD, REMOVAL FORHEAD SKIN LESION  . ORIF TOE FRACTURE Left 02/09/2017   Procedure: EXCISION TALAR HEAD, INTERNAL FIXATION MEDIAL COLUMN LEFT FOOT;  Surgeon: Newt Minion, MD;  Location: [facility];  Service: Orthopedics;  Laterality: Left;  . POLYPECTOMY N/A 05/14/2014   Procedure: ENDOMETRIAL POLYPECTOMY;  Surgeon: Jonnie Kind, MD;  Location: [facility];  Service: Gynecology;  Laterality: N/A;  . STUMP REVISION Left 08/31/2017   Procedure: REVISION LEFT BELOW KNEE AMPUTATION;  Surgeon: Newt Minion, MD;  Location: [facility];  Service: Orthopedics;  Laterality: Left;    There were no vitals filed for this visit.  Subjective Assessment - 02/23/18 1015    Subjective  She is wearing prosthesis 3 hrs 2x/day no issues. She is adjusting ply socks but finds it frustrating.     Patient is accompained by:  Family member   husband who is deaf   Pertinent History  L TTA, bradycardia, depression, lymphedema, DM2, HLD, HTN, CAD,  MI, CABG, CA basal cell face, bowel resection,     Limitations  Lifting;Standing;Walking;House hold activities    Patient Stated Goals  To use prosthesis to get in/out cars, bathrooms, access community & home    Currently in Pain?  No/denies                       [facility] Adult PT Treatment/Exercise - 02/22/18 1015      Transfers   Transfers  Sit to Stand;Stand to Sit    Sit to Stand  4: Min guard;5: Supervision;With upper extremity assist;With armrests;From chair/3-in-1   to RW   Stand to Sit  5: Supervision;With upper extremity assist;With armrests;To chair/3-in-1   from RW     Ambulation/Gait   Ambulation/Gait  Yes  Ambulation/Gait Assistance  4: Min assist;4: Min guard    Ambulation/Gait Assistance Details  PT demo, instructed in sequencing / technique for step thru pattern. Verbal cues on upright posture and wt shift over prosthesis in stance. PT instructed her sister with demo while pt ambulating how to assist with gait. She return demo understanding last gait with minor verbal cues to correct her positioning. She verbalized understanding.     Ambulation Distance (Feet)  25 Feet   25' between w/c & 2nd chair X 4 reps without w/c following   Assistive device  Rolling walker;Prosthesis    Gait Pattern  --    Ambulation Surface  Indoor;Level      Prosthetics   Prosthetic Care Comments   PT instructed with demo how to manage excessive redunant tissue distal to tibia when donning liner. PT reviewed skin inspection and improvements PT noted in her skin.     Current prosthetic wear tolerance (days/week)   daily    Current prosthetic wear tolerance (#hours/day)   3hrs 2x/day    Edema  pitting    Residual limb condition   2 scabs dry on incision, redness distally but decreased since eval, normal skin moisture today, normal temperature, cylinderical shape with excessive soft tissue distal to tibia    Education Provided  Skin check;Residual limb care;Prosthetic  cleaning;Correct ply sock adjustment;Proper wear schedule/adjustment;Proper weight-bearing schedule/adjustment;Other (comment)   see prosthetic care comments   Person(s) Educated  Patient;Spouse;Other (comment)   sister   Education Method  Explanation;Demonstration;Tactile cues;Verbal cues    Education Method  Verbalized understanding;Verbal cues required;Needs further instruction    Donning Prosthesis  Minimal assist    Doffing Prosthesis  Supervision               PT Short Term Goals - 02/23/18 1134      PT SHORT TERM GOAL #1   Title  Patient verbalizes proper cleaning & demonstrates proper donning of prosthesis. (All STGs Target Date: 03/14/2018)     NEED TO SET NEW STGs after these are checked   Time  1    Period  Months    Status  On-going    Target Date  03/14/18      PT SHORT TERM GOAL #2   Title  Patient tolerates prosthesis wear >8 hrs total / day without skin issues.     Time  1    Period  Months    Status  On-going    Target Date  03/14/18      PT SHORT TERM GOAL #3   Title  Patient performs standing balance with RW including reaching 10", picking up items from floor and managing clothes with supervision.     Time  1    Period  Months    Status  On-going    Target Date  03/14/18      PT SHORT TERM GOAL #4   Title  Patient ambulates 21' with RW & prosthesis with supervision.     Time  1    Period  Months    Status  On-going    Target Date  03/14/18      PT SHORT TERM GOAL #5   Title  Patient negotiates ramps & curbs with RW & prosthesis with minA.     Time  1    Period  Months    Status  On-going    Target Date  03/14/18        PT Long Term Goals - 02/23/18  [place] #1   Title  Patient demonstrates and verbalizes proper prosthetic care to enable safe use of prosthesis (All LTGs Target Date: 05/12/2018)    Time  3    Period  Months    Status  On-going    Target Date  05/12/18      PT LONG TERM GOAL #2   Title  Patient  tolerates wear of prosthesis >90% of awake hours without skin or limb pain issues to enable function throughout her day.     Time  3    Period  Months    Status  On-going    Target Date  05/12/18      PT LONG TERM GOAL #3   Title  Lillie Fragmin >36/56    Time  3    Period  Months    Status  On-going    Target Date  05/12/18      PT LONG TERM GOAL #4   Title  Patient ambulates 300' with LRAD & prosthesis modified independent for basic community mobility.     Time  3    Period  Months    Status  On-going    Target Date  05/12/18      PT LONG TERM GOAL #5   Title  Patient negotiates ramps, curbs & stairs with LRAD & prosthesis modified independent for community access.     Time  3    Period  Months    Status  On-going    Target Date  05/12/18            Plan - 02/23/18 1136    Clinical Impression Statement  Patient improved gait short distances between 2 chairs. She & family seem to understand PT recommendations for gait with family short distances (20-25') between 2 chairs with armrests and only long distance to her tolerance when one person can assist & 2nd person follow with w/c.     Rehab Potential  Good    PT Frequency  2x / week    PT Duration  Other (comment)   13 weeks (90 days)   PT Treatment/Interventions  ADLs/Self Care Home Management;DME Instruction;Gait training;Stair training;Functional mobility training;Therapeutic activities;Therapeutic exercise;Balance training;Neuromuscular re-education;Patient/family education;Prosthetic Training;Manual techniques;Vestibular    PT Next Visit Plan  instruct in car transfers when they bring car not [name], review prosthetic care & increase wear to 4hrs 2x/day if no issues, prosthetic gait with RW & instruct / introduce ramps & curbs    Consulted and Agree with Plan of Care  Patient;Family member/caregiver    Family Member Consulted  husband who is deaf       Patient will benefit from skilled therapeutic intervention in order  to improve the following deficits and impairments:  Abnormal gait, Decreased activity tolerance, Decreased balance, Cardiopulmonary status limiting activity, Decreased coordination, Decreased endurance, Decreased knowledge of use of DME, Decreased mobility, Decreased range of motion, Decreased skin integrity, Decreased strength, Dizziness, Impaired flexibility, Increased edema, Obesity, Prosthetic Dependency, Postural dysfunction  Visit Diagnosis: Other abnormalities of gait and mobility  Unsteadiness on feet  Abnormal posture  Muscle weakness (generalized)  Stiffness of left knee, not elsewhere classified  Stiffness of right knee, not elsewhere classified     Problem List Patient Active Problem List   Diagnosis Date Noted  . Dyslipidemia 11/23/2017  . Below knee amputation status, left 08/31/2017  . Dehiscence of amputation stump ([place]) 08/29/2017  . Infection of amputation stump, left lower  extremity ([place]) 06/19/2017  . Bradycardia 05/07/2017  . Depression 05/07/2017  . Lymphedema 05/07/2017  . Charcot foot due to diabetes mellitus ([place]) 02/09/2017  . 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 Post-menopausal bleeding 04/15/2014  . Endometrial polyp 04/09/2014  . Ventral hernia 03/06/2012  . Open abdominal wall wound 03/06/2012  . Hyperlipemia 11/12/2008  . OBESITY, MORBID 11/12/2008  . Essential hypertension 11/12/2008  . CAD (coronary artery disease) 11/12/2008    Jamey Reas PT, DPT 02/23/2018, 11:39 AM  [facility] [address] [place], [place], [address] Phone: [phone]   Fax:  [phone]  Name: FIFI SCHINDLER MRN: [id number] Date of Birth: [birth year]

## 2018-02-27 ENCOUNTER — Ambulatory Visit: Payer: Medicare Other | Admitting: Physical Therapy

## 2018-02-27 ENCOUNTER — Encounter: Payer: Self-pay | Admitting: Physical Therapy

## 2018-02-27 DIAGNOSIS — M25661 Stiffness of right knee, not elsewhere classified: Secondary | ICD-10-CM | POA: Diagnosis not present

## 2018-02-27 DIAGNOSIS — M6281 Muscle weakness (generalized): Secondary | ICD-10-CM

## 2018-02-27 DIAGNOSIS — R2681 Unsteadiness on feet: Secondary | ICD-10-CM

## 2018-02-27 DIAGNOSIS — R293 Abnormal posture: Secondary | ICD-10-CM | POA: Diagnosis not present

## 2018-02-27 DIAGNOSIS — R2689 Other abnormalities of gait and mobility: Secondary | ICD-10-CM | POA: Diagnosis not present

## 2018-02-27 DIAGNOSIS — M25662 Stiffness of left knee, not elsewhere classified: Secondary | ICD-10-CM | POA: Diagnosis not present

## 2018-02-27 NOTE — Therapy (Signed)
[facility] [address] [place], [place], [address] Phone: [phone]   Fax:  [phone]  Physical Therapy Treatment  Patient Details  Name: Julia West MRN: [id number] Date of Birth: [birth year] Referring Provider (PT): Meridee Score, MD   Encounter Date: 02/27/2018  PT End of Session - 02/27/18 1015    Visit Number  5    Number of Visits  26    Date for PT Re-Evaluation  05/12/18    Authorization Type  Medicare & Medicaid     PT Start Time  0935    PT Stop Time  1013    PT Time Calculation (min)  38 min    Equipment Utilized During Treatment  Gait belt    Activity Tolerance  Patient tolerated treatment well;Patient limited by fatigue    Behavior During Therapy  [facility] for tasks assessed/performed       Past Medical History:  Diagnosis Date  . Acute myocardial infarction    with a ruptured plaque in the circumflex in 2003  . Anxiety   . Cancer ([place])    basal cell face  . Complication of anesthesia    states low O2 sats post-op 11/13, always slow to awaken  . Degenerative joint disease   . 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 Dyspnea    with activity- car to house - steps  . HTN (hypertension)   . Hyperlipidemia LDL goal <70   . Internal and external hemorrhoids without complication   . Lymphedema    right side of body  . Lymphedema of arm    right  . Morbid obesity ([place])   . Surgical wound, non healing ABDOMINAL    has wound vac @ 125 mm Hg    Past Surgical History:  Procedure Laterality Date  . AMPUTATION Left 05/06/2017   Procedure: BELOW KNEE AMPUTATION;  Surgeon: Newt Minion, MD;  Location: [facility];  Service: Orthopedics;  Laterality: Left;  . APPLICATION OF WOUND VAC    . BOWEL RESECTION  02/07/2012   Procedure: SMALL BOWEL RESECTION;  Surgeon: Gayland Curry, MD,FACS;  Location: [facility];  Service: General;;  . CARDIOVASCULAR STRESS TEST  01-17-2012  DR HOCHREIN   LOW RISK  NUCLEAR TEST  . CESAREAN SECTION     x 4 in remote past  . CHOLECYSTECTOMY    . CORONARY ARTERY BYPASS GRAFT  2003   by Dr. Servando Snare. LIMA to the LAD, free RIMA to the circumflex. Stress perfusion study December 2009 with no high-risk areas of ischemia. She has a well-preserved ejection fraction  . HYSTEROSCOPY W/D&C N/A 05/14/2014   Procedure: DILATATION AND CURETTAGE (no specimen); HYSTEROSCOPY;  Surgeon: Jonnie Kind, MD;  Location: [facility];  Service: Gynecology;  Laterality: N/A;  . I&D EXTREMITY Left 06/21/2017   Procedure: IRRIGATION AND DEBRIDEMENT OF LEFT LEG AMPUTATION SITE;  Surgeon: Wallace Going, DO;  Location: [facility];  Service: Plastics;  Laterality: Left;  . INCISION AND DRAINAGE OF WOUND  04/17/2012   Procedure: IRRIGATION AND DEBRIDEMENT WOUND;  Surgeon: Theodoro Kos, DO;  Location: [facility];  Service: Plastics;  Laterality: N/A;  OF ABDOMINAL WOUND, SURGICAL PREP AND PLACEMENT OF VAC, REMOVAL FOREHEAD SKIN LESION  . INCISION AND DRAINAGE OF WOUND N/A 04/24/2012   Procedure: IRRIGATION AND DEBRIDEMENT WOUND;  Surgeon: Theodoro Kos, DO;  Location: [facility];  Service: Plastics;  Laterality: N/A;  I & D ABDOMINAL  WOUND WITH VAC AND ACELL  . INCISION AND DRAINAGE OF WOUND N/A 05/01/2012   Procedure: IRRIGATION AND DEBRIDEMENT WOUND;  Surgeon: Theodoro Kos, DO;  Location: [facility];  Service: Plastics;  Laterality: N/A;  WITH SURGICAL PREP AND PLACEMENT OF VAC  . INCISION AND DRAINAGE OF WOUND N/A 05/08/2012   Procedure: IRRIGATION AND DEBRIDEMENT OF ABD WOUND SURGICAL PREP AND PLACEMENT OF VAC ;  Surgeon: Theodoro Kos, DO;  Location: [facility];  Service: Plastics;  Laterality: N/A;  IRRIGATION AND DEBRIDEMENT OF ABD WOUND SURGICAL PREP AND PLACEMENT OF VAC   . INCISION AND DRAINAGE OF WOUND N/A 05/15/2012   Procedure: IRRIGATION AND DEBRIDEMENT OF ABDOMINAL WOUND WITH POSSIBLE SURGICAL PREP AND PLACEMENT OF VAC;   Surgeon: Theodoro Kos, DO;  Location: [facility];  Service: Plastics;  Laterality: N/A;  . INCISION AND DRAINAGE OF WOUND N/A 05/22/2012   Procedure: IRRIGATION AND DEBRIDEMENT OF ABODOMINAL WOUND WITH  SURGICAL PREP AND VAC PLACEMENT;  Surgeon: Theodoro Kos, DO;  Location: [facility];  Service: Plastics;  Laterality: N/A;  . INCISION AND DRAINAGE OF WOUND N/A 06/28/2012   Procedure: IRRIGATION AND DEBRIDEMENT OF ABDOMINAL ULCER SURGICAL PREP AND PLACEMENT OF ACELL AND VAC;  Surgeon: Theodoro Kos, DO;  Location: [facility];  Service: Plastics;  Laterality: N/A;  . INCISIONAL HERNIA REPAIR  02/07/2012   Procedure: HERNIA REPAIR INCISIONAL;  Surgeon: Gayland Curry, MD,FACS;  Location: [facility];  Service: General;;  Open, Primary repair, strangulated Incisional hernia.  Marland Kitchen LESION REMOVAL  04/17/2012   Procedure: LESION REMOVAL;  Surgeon: Theodoro Kos, DO;  Location: [facility];  Service: Plastics;  Laterality: N/A;   CENTER OF FOREHEAD, REMOVAL FORHEAD SKIN LESION  . ORIF TOE FRACTURE Left 02/09/2017   Procedure: EXCISION TALAR HEAD, INTERNAL FIXATION MEDIAL COLUMN LEFT FOOT;  Surgeon: Newt Minion, MD;  Location: [facility];  Service: Orthopedics;  Laterality: Left;  . POLYPECTOMY N/A 05/14/2014   Procedure: ENDOMETRIAL POLYPECTOMY;  Surgeon: Jonnie Kind, MD;  Location: [facility];  Service: Gynecology;  Laterality: N/A;  . STUMP REVISION Left 08/31/2017   Procedure: REVISION LEFT BELOW KNEE AMPUTATION;  Surgeon: Newt Minion, MD;  Location: [facility];  Service: Orthopedics;  Laterality: Left;    There were no vitals filed for this visit.  Subjective Assessment - 02/27/18 0938    Subjective  Pt practised walking short distances with RW in home and worked on ply sock adjustment.    Patient is accompained by:  Family member   husband who is deaf   Pertinent History  L TTA, bradycardia, depression, lymphedema, DM2, HLD, HTN, CAD, MI, CABG, CA  basal cell face, bowel resection,     Limitations  Lifting;Standing;Walking;House hold activities    Patient Stated Goals  To use prosthesis to get in/out cars, bathrooms, access community & home    Currently in Pain?  No/denies                       [facility] Adult PT Treatment/Exercise - 02/27/18 0001      Transfers   Transfers  Sit to Stand;Stand to Sit    Sit to Stand  5: Supervision    Stand to Sit  5: Supervision   cues for posture and safety with turning to sit.     Ambulation/Gait   Ambulation/Gait  Yes    Ambulation/Gait Assistance  4: Min guard    Ambulation/Gait  Assistance Details  instruction for proper posture, steplength, and sequence;  Pt felt uncomfortable pressure in lower residual limb, adjusted ply sock from 3 to 8ply; pt reported limb felt better with increased ply.                                          Ambulation Distance (Feet)  85 Feet   + 30   Assistive device  Rolling walker;Prosthesis    Gait Pattern  Step-to pattern;Decreased step length - right;Decreased stance time - left;Decreased stride length;Decreased hip/knee flexion - left;Decreased weight shift to left;Left hip hike;Right flexed knee in stance;Left flexed knee in stance;Antalgic;Lateral hip instability;Trunk flexed;Abducted - left;Poor foot clearance - left    Ambulation Surface  Level;Indoor    Ramp  4: Min assist    Ramp Details (indicate cue type and reason)  cues for proper sequence and weight shifting.      Prosthetics   Prosthetic Care Comments   Recommend increasing wear 4hrs 2x/day. Reviewed managing extra tissue when donning sleeve.                  Current prosthetic wear tolerance (days/week)   daily    Current prosthetic wear tolerance (#hours/day)   3hrs 2x/day    Residual limb condition   no issues per pt.    Education Provided  Skin check;Residual limb care;Prosthetic cleaning;Correct ply sock adjustment;Proper wear schedule/adjustment;Proper weight-bearing  schedule/adjustment;Other (comment)    Person(s) Educated  Patient;Spouse    Education Method  Explanation    Education Method  Verbalized understanding             PT Education - 02/27/18 1014    Education Details  gait mechanics; ply sock adjustment.    Person(s) Educated  Patient    Methods  Explanation;Tactile cues;Verbal cues    Comprehension  Verbalized understanding;Returned demonstration;Verbal cues required;Need further instruction       PT Short Term Goals - 02/23/18 1134      PT SHORT TERM GOAL #1   Title  Patient verbalizes proper cleaning & demonstrates proper donning of prosthesis. (All STGs Target Date: 03/14/2018)     NEED TO SET NEW STGs after these are checked   Time  1    Period  Months    Status  On-going    Target Date  03/14/18      PT SHORT TERM GOAL #2   Title  Patient tolerates prosthesis wear >8 hrs total / day without skin issues.     Time  1    Period  Months    Status  On-going    Target Date  03/14/18      PT SHORT TERM GOAL #3   Title  Patient performs standing balance with RW including reaching 10", picking up items from floor and managing clothes with supervision.     Time  1    Period  Months    Status  On-going    Target Date  03/14/18      PT SHORT TERM GOAL #4   Title  Patient ambulates 13' with RW & prosthesis with supervision.     Time  1    Period  Months    Status  On-going    Target Date  03/14/18      PT SHORT TERM GOAL #5   Title  Patient negotiates ramps & curbs with  RW & prosthesis with minA.     Time  1    Period  Months    Status  On-going    Target Date  03/14/18        PT Long Term Goals - 02/23/18 1135      PT LONG TERM GOAL #1   Title  Patient demonstrates and verbalizes proper prosthetic care to enable safe use of prosthesis (All LTGs Target Date: 05/12/2018)    Time  3    Period  Months    Status  On-going    Target Date  05/12/18      PT LONG TERM GOAL #2   Title  Patient tolerates wear of  prosthesis >90% of awake hours without skin or limb pain issues to enable function throughout her day.     Time  3    Period  Months    Status  On-going    Target Date  05/12/18      PT LONG TERM GOAL #3   Title  Lillie Fragmin >36/56    Time  3    Period  Months    Status  On-going    Target Date  05/12/18      PT LONG TERM GOAL #4   Title  Patient ambulates 300' with LRAD & prosthesis modified independent for basic community mobility.     Time  3    Period  Months    Status  On-going    Target Date  05/12/18      PT LONG TERM GOAL #5   Title  Patient negotiates ramps, curbs & stairs with LRAD & prosthesis modified independent for community access.     Time  3    Period  Months    Status  On-going    Target Date  05/12/18            Plan - 02/27/18 1311    Clinical Impression Statement  Skilled session focused on gait mechanics and increasing gait distance, prosthetic care, and ramp negotiation.  Pt was able to increase gait distance and progressed with slightly decreased UE support; pt able to perform ramp negotiation at min A level.  Pt reported increased comfort with gait when ply socks were added.                                               Rehab Potential  Good    PT Frequency  2x / week    PT Duration  Other (comment)   13 weeks (90 days)   PT Treatment/Interventions  ADLs/Self Care Home Management;DME Instruction;Gait training;Stair training;Functional mobility training;Therapeutic activities;Therapeutic exercise;Balance training;Neuromuscular re-education;Patient/family education;Prosthetic Training;Manual techniques;Vestibular    PT Next Visit Plan  instruct in car transfers when they bring car not [name],  prosthetic gait with RW & instruct / introduce ramps & curbs    Consulted and Agree with Plan of Care  Patient;Family member/caregiver    Family Member Consulted  husband who is deaf       Patient will benefit from skilled therapeutic intervention in order to  improve the following deficits and impairments:  Abnormal gait, Decreased activity tolerance, Decreased balance, Cardiopulmonary status limiting activity, Decreased coordination, Decreased endurance, Decreased knowledge of use of DME, Decreased mobility, Decreased range of motion, Decreased skin integrity, Decreased strength, Dizziness, Impaired flexibility, Increased edema, Obesity, Prosthetic Dependency,  Postural dysfunction  Visit Diagnosis: Other abnormalities of gait and mobility  Unsteadiness on feet  Abnormal posture  Muscle weakness (generalized)     Problem List Patient Active Problem List   Diagnosis Date Noted  . Dyslipidemia 11/23/2017  . Below knee amputation status, left 08/31/2017  . Dehiscence of amputation stump ([place]) 08/29/2017  . Infection of amputation stump, left lower extremity ([place]) 06/19/2017  . Bradycardia 05/07/2017  . Depression 05/07/2017  . Lymphedema 05/07/2017  . Charcot foot due to diabetes mellitus ([place]) 02/09/2017  . 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 Post-menopausal bleeding 04/15/2014  . Endometrial polyp 04/09/2014  . Ventral hernia 03/06/2012  . Open abdominal wall wound 03/06/2012  . Hyperlipemia 11/12/2008  . OBESITY, MORBID 11/12/2008  . Essential hypertension 11/12/2008  . CAD (coronary artery disease) 11/12/2008    Bjorn Loser, PTA  02/27/18, 1:19 PM [facility] [address] [place], [place], [address] Phone: [phone]   Fax:  [phone]  Name: YANELLY CANTRELLE MRN: [id number] Date of Birth: 1955-05-03

## 2018-03-02 ENCOUNTER — Ambulatory Visit: Payer: Medicare Other | Admitting: Physical Therapy

## 2018-03-02 ENCOUNTER — Encounter: Payer: Self-pay | Admitting: Physical Therapy

## 2018-03-02 DIAGNOSIS — R293 Abnormal posture: Secondary | ICD-10-CM | POA: Diagnosis not present

## 2018-03-02 DIAGNOSIS — R2681 Unsteadiness on feet: Secondary | ICD-10-CM

## 2018-03-02 DIAGNOSIS — M25661 Stiffness of right knee, not elsewhere classified: Secondary | ICD-10-CM | POA: Diagnosis not present

## 2018-03-02 DIAGNOSIS — M25662 Stiffness of left knee, not elsewhere classified: Secondary | ICD-10-CM | POA: Diagnosis not present

## 2018-03-02 DIAGNOSIS — R2689 Other abnormalities of gait and mobility: Secondary | ICD-10-CM | POA: Diagnosis not present

## 2018-03-02 DIAGNOSIS — M6281 Muscle weakness (generalized): Secondary | ICD-10-CM | POA: Diagnosis not present

## 2018-03-03 NOTE — Therapy (Signed)
[facility] [address] [place], [place], [address] Phone: [phone]   Fax:  [phone]  Physical Therapy Treatment  Patient Details  Name: Julia West MRN: [id number] Date of Birth: October 25, 1955 Referring Provider (PT): Meridee Score, MD   Encounter Date: 03/02/2018  PT End of Session - 03/02/18 0938    Visit Number  6    Number of Visits  26    Date for PT Re-Evaluation  05/12/18    Authorization Type  Medicare & Medicaid     PT Start Time  0933    PT Stop Time  1015    PT Time Calculation (min)  42 min    Equipment Utilized During Treatment  Gait belt    Activity Tolerance  Patient tolerated treatment well;Patient limited by fatigue    Behavior During Therapy  [facility] for tasks assessed/performed       Past Medical History:  Diagnosis Date  . Acute myocardial infarction    with a ruptured plaque in the circumflex in 2003  . Anxiety   . Cancer ([place])    basal cell face  . Complication of anesthesia    states low O2 sats post-op 11/13, always slow to awaken  . Degenerative joint disease   . 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 Dyspnea    with activity- car to house - steps  . HTN (hypertension)   . Hyperlipidemia LDL goal <70   . Internal and external hemorrhoids without complication   . Lymphedema    right side of body  . Lymphedema of arm    right  . Morbid obesity ([place])   . Surgical wound, non healing ABDOMINAL    has wound vac @ 125 mm Hg    Past Surgical History:  Procedure Laterality Date  . AMPUTATION Left 05/06/2017   Procedure: BELOW KNEE AMPUTATION;  Surgeon: Newt Minion, MD;  Location: [facility];  Service: Orthopedics;  Laterality: Left;  . APPLICATION OF WOUND VAC    . BOWEL RESECTION  02/07/2012   Procedure: SMALL BOWEL RESECTION;  Surgeon: Gayland Curry, MD,FACS;  Location: [facility];  Service: General;;  . CARDIOVASCULAR STRESS TEST  01-17-2012  DR HOCHREIN   LOW RISK  NUCLEAR TEST  . CESAREAN SECTION     x 4 in remote past  . CHOLECYSTECTOMY    . CORONARY ARTERY BYPASS GRAFT  2003   by Dr. Servando Snare. LIMA to the LAD, free RIMA to the circumflex. Stress perfusion study December 2009 with no high-risk areas of ischemia. She has a well-preserved ejection fraction  . HYSTEROSCOPY W/D&C N/A 05/14/2014   Procedure: DILATATION AND CURETTAGE (no specimen); HYSTEROSCOPY;  Surgeon: Jonnie Kind, MD;  Location: [facility];  Service: Gynecology;  Laterality: N/A;  . I&D EXTREMITY Left 06/21/2017   Procedure: IRRIGATION AND DEBRIDEMENT OF LEFT LEG AMPUTATION SITE;  Surgeon: Wallace Going, DO;  Location: [facility];  Service: Plastics;  Laterality: Left;  . INCISION AND DRAINAGE OF WOUND  04/17/2012   Procedure: IRRIGATION AND DEBRIDEMENT WOUND;  Surgeon: Theodoro Kos, DO;  Location: [facility];  Service: Plastics;  Laterality: N/A;  OF ABDOMINAL WOUND, SURGICAL PREP AND PLACEMENT OF VAC, REMOVAL FOREHEAD SKIN LESION  . INCISION AND DRAINAGE OF WOUND N/A 04/24/2012   Procedure: IRRIGATION AND DEBRIDEMENT WOUND;  Surgeon: Theodoro Kos, DO;  Location: [facility];  Service: Plastics;  Laterality: N/A;  I & D ABDOMINAL  WOUND WITH VAC AND ACELL  . INCISION AND DRAINAGE OF WOUND N/A 05/01/2012   Procedure: IRRIGATION AND DEBRIDEMENT WOUND;  Surgeon: Theodoro Kos, DO;  Location: [facility];  Service: Plastics;  Laterality: N/A;  WITH SURGICAL PREP AND PLACEMENT OF VAC  . INCISION AND DRAINAGE OF WOUND N/A 05/08/2012   Procedure: IRRIGATION AND DEBRIDEMENT OF ABD WOUND SURGICAL PREP AND PLACEMENT OF VAC ;  Surgeon: Theodoro Kos, DO;  Location: [facility];  Service: Plastics;  Laterality: N/A;  IRRIGATION AND DEBRIDEMENT OF ABD WOUND SURGICAL PREP AND PLACEMENT OF VAC   . INCISION AND DRAINAGE OF WOUND N/A 05/15/2012   Procedure: IRRIGATION AND DEBRIDEMENT OF ABDOMINAL WOUND WITH POSSIBLE SURGICAL PREP AND PLACEMENT OF VAC;   Surgeon: Theodoro Kos, DO;  Location: [facility];  Service: Plastics;  Laterality: N/A;  . INCISION AND DRAINAGE OF WOUND N/A 05/22/2012   Procedure: IRRIGATION AND DEBRIDEMENT OF ABODOMINAL WOUND WITH  SURGICAL PREP AND VAC PLACEMENT;  Surgeon: Theodoro Kos, DO;  Location: [facility];  Service: Plastics;  Laterality: N/A;  . INCISION AND DRAINAGE OF WOUND N/A 06/28/2012   Procedure: IRRIGATION AND DEBRIDEMENT OF ABDOMINAL ULCER SURGICAL PREP AND PLACEMENT OF ACELL AND VAC;  Surgeon: Theodoro Kos, DO;  Location: [facility];  Service: Plastics;  Laterality: N/A;  . INCISIONAL HERNIA REPAIR  02/07/2012   Procedure: HERNIA REPAIR INCISIONAL;  Surgeon: Gayland Curry, MD,FACS;  Location: [facility];  Service: General;;  Open, Primary repair, strangulated Incisional hernia.  Marland Kitchen LESION REMOVAL  04/17/2012   Procedure: LESION REMOVAL;  Surgeon: Theodoro Kos, DO;  Location: [facility];  Service: Plastics;  Laterality: N/A;   CENTER OF FOREHEAD, REMOVAL FORHEAD SKIN LESION  . ORIF TOE FRACTURE Left 02/09/2017   Procedure: EXCISION TALAR HEAD, INTERNAL FIXATION MEDIAL COLUMN LEFT FOOT;  Surgeon: Newt Minion, MD;  Location: [facility];  Service: Orthopedics;  Laterality: Left;  . POLYPECTOMY N/A 05/14/2014   Procedure: ENDOMETRIAL POLYPECTOMY;  Surgeon: Jonnie Kind, MD;  Location: [facility];  Service: Gynecology;  Laterality: N/A;  . STUMP REVISION Left 08/31/2017   Procedure: REVISION LEFT BELOW KNEE AMPUTATION;  Surgeon: Newt Minion, MD;  Location: [facility];  Service: Orthopedics;  Laterality: Left;    There were no vitals filed for this visit.  Subjective Assessment - 03/02/18 0934    Subjective  Has been more active with walking with prosthesis at home. No falls or pain to report.     Patient is accompained by:  Family member   spouse whom is deaf   Pertinent History  L TTA, bradycardia, depression, lymphedema, DM2, HLD, HTN, CAD, MI, CABG, CA  basal cell face, bowel resection,     Limitations  Lifting;Standing;Walking;House hold activities    Patient Stated Goals  To use prosthesis to get in/out cars, bathrooms, access community & home    Currently in Pain?  No/denies         03/02/18 0939  Transfers  Transfers Sit to Stand;Stand to Sit  Sit to Stand 5: Supervision;With upper extremity assist;From chair/3-in-1  Sit to Stand Details (indicate cue type and reason) needs UE support on RW for stability with standing   Stand to Sit 5: Supervision;With upper extremity assist;To chair/3-in-1  Stand to Sit Details uses RW for stability with sitting down.   Ambulation/Gait  Ambulation/Gait Yes  Ambulation/Gait Assistance 4: Min guard  Ambulation/Gait Assistance Details cues on upright posture, incr step length  bil and for weight shifting with gait.   Ambulation Distance (Feet) 80 Feet (x2)  Assistive device Rolling walker;Prosthesis  Gait Pattern Step-to pattern;Decreased step length - right;Decreased stance time - left;Decreased stride length;Decreased hip/knee flexion - left;Decreased weight shift to left;Left hip hike;Right flexed knee in stance;Left flexed knee in stance;Antalgic;Lateral hip instability;Trunk flexed;Abducted - left;Poor foot clearance - left  Ambulation Surface Level;Indoor  Stairs Yes  Stairs Assistance 4: Min assist;4: Min guard  Stairs Assistance Details (indicate cue type and reason) 1st rep with bil rails after PTA demo'd technique/sequencingl. pt ascended/descended 4 with bil rails with min guard assist. 2cd rep: PTA with second person to assist demo'd technique for ascending backward with RW/forward with RW. min assist for walker stability only with second person for safety behind pt on stairs. Pt would like to practice this again when daughter attends PT with her.           Stair Management Technique Two rails;No rails;Step to pattern;Backwards;Forwards;With walker  Number of Stairs 4 (x1, 3 x1)  Height of  Stairs 6  Prosthetics  Current prosthetic wear tolerance (days/week)  daily  Current prosthetic wear tolerance (#hours/day)  4-5 hours 2x day  Residual limb condition  no issues per pt.  Donning Prosthesis 5  Doffing Prosthesis 5      PT Short Term Goals - 02/23/18 1134      PT SHORT TERM GOAL #1   Title  Patient verbalizes proper cleaning & demonstrates proper donning of prosthesis. (All STGs Target Date: 03/14/2018)     NEED TO SET NEW STGs after these are checked   Time  1    Period  Months    Status  On-going    Target Date  03/14/18      PT SHORT TERM GOAL #2   Title  Patient tolerates prosthesis wear >8 hrs total / day without skin issues.     Time  1    Period  Months    Status  On-going    Target Date  03/14/18      PT SHORT TERM GOAL #3   Title  Patient performs standing balance with RW including reaching 10", picking up items from floor and managing clothes with supervision.     Time  1    Period  Months    Status  On-going    Target Date  03/14/18      PT SHORT TERM GOAL #4   Title  Patient ambulates 37' with RW & prosthesis with supervision.     Time  1    Period  Months    Status  On-going    Target Date  03/14/18      PT SHORT TERM GOAL #5   Title  Patient negotiates ramps & curbs with RW & prosthesis with minA.     Time  1    Period  Months    Status  On-going    Target Date  03/14/18        PT Long Term Goals - 02/23/18 1135      PT LONG TERM GOAL #1   Title  Patient demonstrates and verbalizes proper prosthetic care to enable safe use of prosthesis (All LTGs Target Date: 05/12/2018)    Time  3    Period  Months    Status  On-going    Target Date  05/12/18      PT LONG TERM GOAL #2   Title  Patient tolerates wear of prosthesis >  90% of awake hours without skin or limb pain issues to enable function throughout her day.     Time  3    Period  Months    Status  On-going    Target Date  05/12/18      PT LONG TERM GOAL #3   Title  Lillie Fragmin >36/56    Time  3    Period  Months    Status  On-going    Target Date  05/12/18      PT LONG TERM GOAL #4   Title  Patient ambulates 300' with LRAD & prosthesis modified independent for basic community mobility.     Time  3    Period  Months    Status  On-going    Target Date  05/12/18      PT LONG TERM GOAL #5   Title  Patient negotiates ramps, curbs & stairs with LRAD & prosthesis modified independent for community access.     Time  3    Period  Months    Status  On-going    Target Date  05/12/18            Plan - 03/02/18 2353    Clinical Impression Statement  Today's skilled session continued with gait training with RW and began stair instructions with rails and RW. Pt needs additional practice with stairs with RW in prep for going to her daughters house where there are 3 stairs with no rails. Her daughter is planning to come to her next session before Christmas for instruction in how to assist patient. The pt is progressing toward goals and should benefit from continued PT to progress toward unmet goals.     Rehab Potential  Good    PT Frequency  2x / week    PT Duration  Other (comment)   13 weeks (90 days)   PT Treatment/Interventions  ADLs/Self Care Home Management;DME Instruction;Gait training;Stair training;Functional mobility training;Therapeutic activities;Therapeutic exercise;Balance training;Neuromuscular re-education;Patient/family education;Prosthetic Training;Manual techniques;Vestibular    PT Next Visit Plan  go over stairs with RW (up backwards/down forwards) with daughter; instruct in car transfers when they bring car not [name],  prosthetic gait with RW & instruct / introduce ramps & curbs    Consulted and Agree with Plan of Care  Patient;Family member/caregiver    Family Member Consulted  husband who is deaf       Patient will benefit from skilled therapeutic intervention in order to improve the following deficits and impairments:  Abnormal gait,  Decreased activity tolerance, Decreased balance, Cardiopulmonary status limiting activity, Decreased coordination, Decreased endurance, Decreased knowledge of use of DME, Decreased mobility, Decreased range of motion, Decreased skin integrity, Decreased strength, Dizziness, Impaired flexibility, Increased edema, Obesity, Prosthetic Dependency, Postural dysfunction  Visit Diagnosis: Other abnormalities of gait and mobility  Unsteadiness on feet  Abnormal posture  Muscle weakness (generalized)     Problem List Patient Active Problem List   Diagnosis Date Noted  . Dyslipidemia 11/23/2017  . Below knee amputation status, left 08/31/2017  . Dehiscence of amputation stump ([facility]) 08/29/2017  . Infection of amputation stump, left lower extremity ([facility] [place]) 06/19/2017  . Bradycardia 05/07/2017  . Depression 05/07/2017  . Lymphedema 05/07/2017  . Charcot foot due to diabetes mellitus ([facility]) 02/09/2017  . 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 Post-menopausal bleeding 04/15/2014  . Endometrial polyp 04/09/2014  . Ventral hernia 03/06/2012  . Open abdominal wall wound 03/06/2012  .  Hyperlipemia 11/12/2008  . OBESITY, MORBID 11/12/2008  . Essential hypertension 11/12/2008  . CAD (coronary artery disease) 11/12/2008    Willow Ora, PTA, [facility] [address] [place], [place] [address] [phone] 03/03/18, 11:38 AM   Name: Julia West MRN: [id number] Date of Birth: 05/31/1955

## 2018-03-06 ENCOUNTER — Ambulatory Visit: Payer: Medicare Other | Admitting: Physical Therapy

## 2018-03-06 ENCOUNTER — Encounter: Payer: Self-pay | Admitting: Physical Therapy

## 2018-03-06 DIAGNOSIS — R293 Abnormal posture: Secondary | ICD-10-CM

## 2018-03-06 DIAGNOSIS — M25662 Stiffness of left knee, not elsewhere classified: Secondary | ICD-10-CM

## 2018-03-06 DIAGNOSIS — R2681 Unsteadiness on feet: Secondary | ICD-10-CM | POA: Diagnosis not present

## 2018-03-06 DIAGNOSIS — M25661 Stiffness of right knee, not elsewhere classified: Secondary | ICD-10-CM | POA: Diagnosis not present

## 2018-03-06 DIAGNOSIS — R2689 Other abnormalities of gait and mobility: Secondary | ICD-10-CM | POA: Diagnosis not present

## 2018-03-06 DIAGNOSIS — M6281 Muscle weakness (generalized): Secondary | ICD-10-CM | POA: Diagnosis not present

## 2018-03-06 NOTE — Therapy (Signed)
[facility] [address] [place], [place], [address] Phone: [phone]   Fax:  [phone]  Physical Therapy Treatment  Patient Details  Name: Julia West MRN: [id number] Date of Birth: 07/19/1955 Referring Provider (PT): Meridee Score, MD   Encounter Date: 03/06/2018  PT End of Session - 03/06/18 1017    Visit Number  7    Number of Visits  26    Date for PT Re-Evaluation  05/12/18    Authorization Type  Medicare & Medicaid     PT Start Time  0930    PT Stop Time  6962    PT Time Calculation (min)  45 min    Equipment Utilized During Treatment  Gait belt    Activity Tolerance  Patient tolerated treatment well;Patient limited by fatigue    Behavior During Therapy  [facility] for tasks assessed/performed       Past Medical History:  Diagnosis Date  . Acute myocardial infarction    with a ruptured plaque in the circumflex in 2003  . Anxiety   . 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 Complication of anesthesia    states low O2 sats post-op 11/13, always slow to awaken  . Degenerative joint disease   . 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 Dyspnea    with activity- car to house - steps  . HTN (hypertension)   . Hyperlipidemia LDL goal <70   . Internal and external hemorrhoids without complication   . Lymphedema    right side of body  . Lymphedema of arm    right  . Morbid obesity ([place])   . Surgical wound, non healing ABDOMINAL    has wound vac @ 125 mm Hg    Past Surgical History:  Procedure Laterality Date  . AMPUTATION Left 05/06/2017   Procedure: BELOW KNEE AMPUTATION;  Surgeon: Newt Minion, MD;  Location: [facility];  Service: Orthopedics;  Laterality: Left;  . APPLICATION OF WOUND VAC    . BOWEL RESECTION  02/07/2012   Procedure: SMALL BOWEL RESECTION;  Surgeon: Gayland Curry, MD,FACS;  Location: [facility];  Service: General;;  . CARDIOVASCULAR STRESS TEST  01-17-2012  DR HOCHREIN   LOW RISK  NUCLEAR TEST  . CESAREAN SECTION     x 4 in remote past  . CHOLECYSTECTOMY    . CORONARY ARTERY BYPASS GRAFT  2003   by Dr. Servando Snare. LIMA to the LAD, free RIMA to the circumflex. Stress perfusion study December 2009 with no high-risk areas of ischemia. She has a well-preserved ejection fraction  . HYSTEROSCOPY W/D&C N/A 05/14/2014   Procedure: DILATATION AND CURETTAGE (no specimen); HYSTEROSCOPY;  Surgeon: Jonnie Kind, MD;  Location: [facility];  Service: Gynecology;  Laterality: N/A;  . I&D EXTREMITY Left 06/21/2017   Procedure: IRRIGATION AND DEBRIDEMENT OF LEFT LEG AMPUTATION SITE;  Surgeon: Wallace Going, DO;  Location: [facility];  Service: Plastics;  Laterality: Left;  . INCISION AND DRAINAGE OF WOUND  04/17/2012   Procedure: IRRIGATION AND DEBRIDEMENT WOUND;  Surgeon: Theodoro Kos, DO;  Location: [facility];  Service: Plastics;  Laterality: N/A;  OF ABDOMINAL WOUND, SURGICAL PREP AND PLACEMENT OF VAC, REMOVAL FOREHEAD SKIN LESION  . INCISION AND DRAINAGE OF WOUND N/A 04/24/2012   Procedure: IRRIGATION AND DEBRIDEMENT WOUND;  Surgeon: Theodoro Kos, DO;  Location: [facility];  Service: Plastics;  Laterality: N/A;  I & D ABDOMINAL  WOUND WITH VAC AND ACELL  . INCISION AND DRAINAGE OF WOUND N/A 05/01/2012   Procedure: IRRIGATION AND DEBRIDEMENT WOUND;  Surgeon: Theodoro Kos, DO;  Location: [facility] [facility];  Service: Plastics;  Laterality: N/A;  WITH SURGICAL PREP AND PLACEMENT OF VAC  . INCISION AND DRAINAGE OF WOUND N/A 05/08/2012   Procedure: IRRIGATION AND DEBRIDEMENT OF ABD WOUND SURGICAL PREP AND PLACEMENT OF VAC ;  Surgeon: Theodoro Kos, DO;  Location: [facility];  Service: Plastics;  Laterality: N/A;  IRRIGATION AND DEBRIDEMENT OF ABD WOUND SURGICAL PREP AND PLACEMENT OF VAC   . INCISION AND DRAINAGE OF WOUND N/A 05/15/2012   Procedure: IRRIGATION AND DEBRIDEMENT OF ABDOMINAL WOUND WITH POSSIBLE SURGICAL PREP AND PLACEMENT OF VAC;   Surgeon: Theodoro Kos, DO;  Location: [facility];  Service: Plastics;  Laterality: N/A;  . INCISION AND DRAINAGE OF WOUND N/A 05/22/2012   Procedure: IRRIGATION AND DEBRIDEMENT OF ABODOMINAL WOUND WITH  SURGICAL PREP AND VAC PLACEMENT;  Surgeon: Theodoro Kos, DO;  Location: [facility];  Service: Plastics;  Laterality: N/A;  . INCISION AND DRAINAGE OF WOUND N/A 06/28/2012   Procedure: IRRIGATION AND DEBRIDEMENT OF ABDOMINAL ULCER SURGICAL PREP AND PLACEMENT OF ACELL AND VAC;  Surgeon: Theodoro Kos, DO;  Location: [facility];  Service: Plastics;  Laterality: N/A;  . INCISIONAL HERNIA REPAIR  02/07/2012   Procedure: HERNIA REPAIR INCISIONAL;  Surgeon: Gayland Curry, MD,FACS;  Location: [facility];  Service: General;;  Open, Primary repair, strangulated Incisional hernia.  Marland Kitchen LESION REMOVAL  04/17/2012   Procedure: LESION REMOVAL;  Surgeon: Theodoro Kos, DO;  Location: [facility];  Service: Plastics;  Laterality: N/A;   CENTER OF FOREHEAD, REMOVAL FORHEAD SKIN LESION  . ORIF TOE FRACTURE Left 02/09/2017   Procedure: EXCISION TALAR HEAD, INTERNAL FIXATION MEDIAL COLUMN LEFT FOOT;  Surgeon: Newt Minion, MD;  Location: [facility];  Service: Orthopedics;  Laterality: Left;  . POLYPECTOMY N/A 05/14/2014   Procedure: ENDOMETRIAL POLYPECTOMY;  Surgeon: Jonnie Kind, MD;  Location: [facility];  Service: Gynecology;  Laterality: N/A;  . STUMP REVISION Left 08/31/2017   Procedure: REVISION LEFT BELOW KNEE AMPUTATION;  Surgeon: Newt Minion, MD;  Location: [facility];  Service: Orthopedics;  Laterality: Left;    There were no vitals filed for this visit.  Subjective Assessment - 03/06/18 0929    Subjective  She is wearing prosthesis 4 hrs 2x/day. It seems rotated.     Patient is accompained by:  Family member   spouse whom is deaf   Pertinent History  L TTA, bradycardia, depression, lymphedema, DM2, HLD, HTN, CAD, MI, CABG, CA basal cell face, bowel resection,      Limitations  Lifting;Standing;Walking;House hold activities    Patient Stated Goals  To use prosthesis to get in/out cars, bathrooms, access community & home                       [facility] Adult PT Treatment/Exercise - 03/06/18 0930      Transfers   Transfers  Sit to Stand;Stand to Sit    Sit to Stand  5: Supervision;With upper extremity assist;From chair/3-in-1;With armrests   uses RW to stabilize   Stand to Sit  5: Supervision;With upper extremity assist;To chair/3-in-1;With armrests   from RW     Ambulation/Gait   Ambulation/Gait  Yes    Ambulation/Gait Assistance  4: Min guard;5: Supervision    Ambulation/Gait Assistance Details  cues on upright posture & wt shift over prosthesis in stance    Ambulation Distance (Feet)  125 Feet    Assistive device  Rolling walker;Prosthesis    Gait Pattern  Step-to pattern;Decreased step length - right;Decreased stance time - left;Decreased stride length;Decreased hip/knee flexion - left;Decreased weight shift to left;Left hip hike;Right flexed knee in stance;Left flexed knee in stance;Antalgic;Lateral hip instability;Trunk flexed;Abducted - left;Poor foot clearance - left    Ambulation Surface  Indoor;Level    Stairs  Yes    Stairs Assistance  4: Min assist;Other (comment)   2nd person of dtr to stabilize walker   Stair Management Technique  With walker;No rails;Step to pattern;Backwards;Forwards   backwards ascend & forward descend   Number of Stairs  4    Height of Stairs  6      Prosthetics   Prosthetic Care Comments   PT called & requested pretibial pads placed in socket. Pt arrived with 1-ply sock and prosthesis rotating. PT increased to 5-ply fit. PT instructed in need to pick up feet in turns (not pivoting) and squaring feet with chair prior to sitting.     Current prosthetic wear tolerance (days/week)   daily    Current prosthetic wear tolerance (#hours/day)   4 hours 2x day PT increased to 5 hrsa 2x/day drying  limb/liner half way.     Residual limb condition   no issues. distal redness continues to heal.     Education Provided  Skin check;Correct ply sock adjustment;Proper Donning;Proper wear schedule/adjustment    Person(s) Educated  Patient;Spouse;Child(ren)    Education Method  Explanation;Verbal cues    Education Method  Verbalized understanding;Verbal cues required;Needs further instruction    Donning Prosthesis  Supervision   reviewed orienting loose tissue   Doffing Prosthesis  Modified independent (device/increased time)             PT Education - 03/06/18 1223    Education Details  increasing activity level with increasing frequency of short walks in home, long walks 1-2 /day with family assist /supervision (husband following with her w/c) and 4-6 medium distance walks/day    Person(s) Educated  Patient;Child(ren)    Methods  Explanation    Comprehension  Verbalized understanding;Need further instruction       PT Short Term Goals - 02/23/18 1134      PT SHORT TERM GOAL #1   Title  Patient verbalizes proper cleaning & demonstrates proper donning of prosthesis. (All STGs Target Date: 03/14/2018)     NEED TO SET NEW STGs after these are checked   Time  1    Period  Months    Status  On-going    Target Date  03/14/18      PT SHORT TERM GOAL #2   Title  Patient tolerates prosthesis wear >8 hrs total / day without skin issues.     Time  1    Period  Months    Status  On-going    Target Date  03/14/18      PT SHORT TERM GOAL #3   Title  Patient performs standing balance with RW including reaching 10", picking up items from floor and managing clothes with supervision.     Time  1    Period  Months    Status  On-going    Target Date  03/14/18      PT SHORT TERM GOAL #4   Title  Patient ambulates 23' with RW & prosthesis with supervision.  Time  1    Period  Months    Status  On-going    Target Date  03/14/18      PT SHORT TERM GOAL #5   Title  Patient  negotiates ramps & curbs with RW & prosthesis with minA.     Time  1    Period  Months    Status  On-going    Target Date  03/14/18        PT Long Term Goals - 02/23/18 1135      PT LONG TERM GOAL #1   Title  Patient demonstrates and verbalizes proper prosthetic care to enable safe use of prosthesis (All LTGs Target Date: 05/12/2018)    Time  3    Period  Months    Status  On-going    Target Date  05/12/18      PT LONG TERM GOAL #2   Title  Patient tolerates wear of prosthesis >90% of awake hours without skin or limb pain issues to enable function throughout her day.     Time  3    Period  Months    Status  On-going    Target Date  05/12/18      PT LONG TERM GOAL #3   Title  Lillie Fragmin >36/56    Time  3    Period  Months    Status  On-going    Target Date  05/12/18      PT LONG TERM GOAL #4   Title  Patient ambulates 300' with LRAD & prosthesis modified independent for basic community mobility.     Time  3    Period  Months    Status  On-going    Target Date  05/12/18      PT LONG TERM GOAL #5   Title  Patient negotiates ramps, curbs & stairs with LRAD & prosthesis modified independent for community access.     Time  3    Period  Months    Status  On-going    Target Date  05/12/18            Plan - 03/06/18 1220    Clinical Impression Statement  Patient and daughter seem to understand how to negotiate steps without rails using RW to enable to get in her other dtr's house for Christmas. Patient has understanding of PT recommendations to increase activity level.     Rehab Potential  Good    PT Frequency  2x / week    PT Duration  Other (comment)   13 weeks (90 days)   PT Treatment/Interventions  ADLs/Self Care Home Management;DME Instruction;Gait training;Stair training;Functional mobility training;Therapeutic activities;Therapeutic exercise;Balance training;Neuromuscular re-education;Patient/family education;Prosthetic Training;Manual techniques;Vestibular     PT Next Visit Plan  work towards [place], review prosthetic care. sit to/from stand from standard ht chairs without armrests.     Consulted and Agree with Plan of Care  Patient;Family member/caregiver    Family Member Consulted  husband who is deaf       Patient will benefit from skilled therapeutic intervention in order to improve the following deficits and impairments:  Abnormal gait, Decreased activity tolerance, Decreased balance, Cardiopulmonary status limiting activity, Decreased coordination, Decreased endurance, Decreased knowledge of use of DME, Decreased mobility, Decreased range of motion, Decreased skin integrity, Decreased strength, Dizziness, Impaired flexibility, Increased edema, Obesity, Prosthetic Dependency, Postural dysfunction  Visit Diagnosis: Other abnormalities of gait and mobility  Unsteadiness on feet  Abnormal posture  Muscle weakness (generalized)  Stiffness of left knee, not elsewhere classified  Stiffness of right knee, not elsewhere classified     Problem List Patient Active Problem List   Diagnosis Date Noted  . Dyslipidemia 11/23/2017  . Below knee amputation status, left 08/31/2017  . Dehiscence of amputation stump ([place]) 08/29/2017  . Infection of amputation stump, left lower extremity ([facility]) 06/19/2017  . Bradycardia 05/07/2017  . Depression 05/07/2017  . Lymphedema 05/07/2017  . Charcot foot due to diabetes mellitus ([facility]) 02/09/2017  . 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 Post-menopausal bleeding 04/15/2014  . Endometrial polyp 04/09/2014  . Ventral hernia 03/06/2012  . Open abdominal wall wound 03/06/2012  . Hyperlipemia 11/12/2008  . OBESITY, MORBID 11/12/2008  . Essential hypertension 11/12/2008  . CAD (coronary artery disease) 11/12/2008    Jamey Reas PT, DPT 03/06/2018, 12:24 PM  [facility] [address] [place], [place],  [address] Phone: [phone]   Fax:  [phone]  Name: Julia West MRN: [id number] Date of Birth: 07-14-1955

## 2018-03-08 DIAGNOSIS — S81009A Unspecified open wound, unspecified knee, initial encounter: Secondary | ICD-10-CM | POA: Diagnosis not present

## 2018-03-08 DIAGNOSIS — R2681 Unsteadiness on feet: Secondary | ICD-10-CM | POA: Diagnosis not present

## 2018-03-09 DIAGNOSIS — Z4789 Encounter for other orthopedic aftercare: Secondary | ICD-10-CM | POA: Diagnosis not present

## 2018-03-09 DIAGNOSIS — R2681 Unsteadiness on feet: Secondary | ICD-10-CM | POA: Diagnosis not present

## 2018-03-09 DIAGNOSIS — Z89512 Acquired absence of left leg below knee: Secondary | ICD-10-CM | POA: Diagnosis not present

## 2018-03-09 DIAGNOSIS — E11610 Type 2 diabetes mellitus with diabetic neuropathic arthropathy: Secondary | ICD-10-CM | POA: Diagnosis not present

## 2018-03-10 ENCOUNTER — Ambulatory Visit: Payer: Medicare Other

## 2018-03-10 DIAGNOSIS — R2689 Other abnormalities of gait and mobility: Secondary | ICD-10-CM

## 2018-03-10 DIAGNOSIS — M25662 Stiffness of left knee, not elsewhere classified: Secondary | ICD-10-CM | POA: Diagnosis not present

## 2018-03-10 DIAGNOSIS — R293 Abnormal posture: Secondary | ICD-10-CM | POA: Diagnosis not present

## 2018-03-10 DIAGNOSIS — M6281 Muscle weakness (generalized): Secondary | ICD-10-CM | POA: Diagnosis not present

## 2018-03-10 DIAGNOSIS — M25661 Stiffness of right knee, not elsewhere classified: Secondary | ICD-10-CM | POA: Diagnosis not present

## 2018-03-10 DIAGNOSIS — R2681 Unsteadiness on feet: Secondary | ICD-10-CM | POA: Diagnosis not present

## 2018-03-10 NOTE — Therapy (Signed)
[facility] [address] [place], [place], [address] Phone: [phone]   Fax:  [phone]  Physical Therapy Treatment  Patient Details  Name: Julia West MRN: [id number] Date of Birth: 1955/10/22 Referring Provider (PT): Meridee Score, MD   Encounter Date: 03/10/2018  PT End of Session - 03/10/18 0936    Visit Number  8    Number of Visits  26    Date for PT Re-Evaluation  05/12/18    Authorization Type  Medicare & Medicaid     PT Start Time  0932    PT Stop Time  6659    PT Time Calculation (min)  42 min    Equipment Utilized During Treatment  Gait belt    Activity Tolerance  Patient tolerated treatment well;Patient limited by fatigue    Behavior During Therapy  [facility] for tasks assessed/performed       Past Medical History:  Diagnosis Date  . Acute myocardial infarction    with a ruptured plaque in the circumflex in 2003  . Anxiety   . Cancer ([place])    basal cell face  . Complication of anesthesia    states low O2 sats post-op 11/13, always slow to awaken  . Degenerative joint disease   . 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 Dyspnea    with activity- car to house - steps  . HTN (hypertension)   . Hyperlipidemia LDL goal <70   . Internal and external hemorrhoids without complication   . Lymphedema    right side of body  . Lymphedema of arm    right  . Morbid obesity ([place])   . Surgical wound, non healing ABDOMINAL    has wound vac @ 125 mm Hg    Past Surgical History:  Procedure Laterality Date  . AMPUTATION Left 05/06/2017   Procedure: BELOW KNEE AMPUTATION;  Surgeon: Newt Minion, MD;  Location: [place];  Service: Orthopedics;  Laterality: Left;  . APPLICATION OF WOUND VAC    . BOWEL RESECTION  02/07/2012   Procedure: SMALL BOWEL RESECTION;  Surgeon: Gayland Curry, MD,FACS;  Location: [place];  Service: General;;  . CARDIOVASCULAR STRESS TEST  01-17-2012  DR HOCHREIN   LOW RISK  NUCLEAR TEST  . CESAREAN SECTION     x 4 in remote past  . CHOLECYSTECTOMY    . CORONARY ARTERY BYPASS GRAFT  2003   by Dr. Servando Snare. LIMA to the LAD, free RIMA to the circumflex. Stress perfusion study December 2009 with no high-risk areas of ischemia. She has a well-preserved ejection fraction  . HYSTEROSCOPY W/D&C N/A 05/14/2014   Procedure: DILATATION AND CURETTAGE (no specimen); HYSTEROSCOPY;  Surgeon: Jonnie Kind, MD;  Location: [facility];  Service: Gynecology;  Laterality: N/A;  . I&D EXTREMITY Left 06/21/2017   Procedure: IRRIGATION AND DEBRIDEMENT OF LEFT LEG AMPUTATION SITE;  Surgeon: Wallace Going, DO;  Location: [facility];  Service: Plastics;  Laterality: Left;  . INCISION AND DRAINAGE OF WOUND  04/17/2012   Procedure: IRRIGATION AND DEBRIDEMENT WOUND;  Surgeon: Theodoro Kos, DO;  Location: [place];  Service: Plastics;  Laterality: N/A;  OF ABDOMINAL WOUND, SURGICAL PREP AND PLACEMENT OF VAC, REMOVAL FOREHEAD SKIN LESION  . INCISION AND DRAINAGE OF WOUND N/A 04/24/2012   Procedure: IRRIGATION AND DEBRIDEMENT WOUND;  Surgeon: Theodoro Kos, DO;  Location: [place];  Service: Plastics;  Laterality: N/A;  I & D ABDOMINAL  WOUND WITH VAC AND ACELL  . INCISION AND DRAINAGE OF WOUND N/A 05/01/2012   Procedure: IRRIGATION AND DEBRIDEMENT WOUND;  Surgeon: Theodoro Kos, DO;  Location: [place];  Service: Plastics;  Laterality: N/A;  WITH SURGICAL PREP AND PLACEMENT OF VAC  . INCISION AND DRAINAGE OF WOUND N/A 05/08/2012   Procedure: IRRIGATION AND DEBRIDEMENT OF ABD WOUND SURGICAL PREP AND PLACEMENT OF VAC ;  Surgeon: Theodoro Kos, DO;  Location: [place];  Service: Plastics;  Laterality: N/A;  IRRIGATION AND DEBRIDEMENT OF ABD WOUND SURGICAL PREP AND PLACEMENT OF VAC   . INCISION AND DRAINAGE OF WOUND N/A 05/15/2012   Procedure: IRRIGATION AND DEBRIDEMENT OF ABDOMINAL WOUND WITH POSSIBLE SURGICAL PREP AND PLACEMENT OF VAC;   Surgeon: Theodoro Kos, DO;  Location: [place];  Service: Plastics;  Laterality: N/A;  . INCISION AND DRAINAGE OF WOUND N/A 05/22/2012   Procedure: IRRIGATION AND DEBRIDEMENT OF ABODOMINAL WOUND WITH  SURGICAL PREP AND VAC PLACEMENT;  Surgeon: Theodoro Kos, DO;  Location: [place];  Service: Plastics;  Laterality: N/A;  . INCISION AND DRAINAGE OF WOUND N/A 06/28/2012   Procedure: IRRIGATION AND DEBRIDEMENT OF ABDOMINAL ULCER SURGICAL PREP AND PLACEMENT OF ACELL AND VAC;  Surgeon: Theodoro Kos, DO;  Location: [facility];  Service: Plastics;  Laterality: N/A;  . INCISIONAL HERNIA REPAIR  02/07/2012   Procedure: HERNIA REPAIR INCISIONAL;  Surgeon: Gayland Curry, MD,FACS;  Location: [place];  Service: General;;  Open, Primary repair, strangulated Incisional hernia.  Marland Kitchen LESION REMOVAL  04/17/2012   Procedure: LESION REMOVAL;  Surgeon: Theodoro Kos, DO;  Location: [place];  Service: Plastics;  Laterality: N/A;   CENTER OF FOREHEAD, REMOVAL FORHEAD SKIN LESION  . ORIF TOE FRACTURE Left 02/09/2017   Procedure: EXCISION TALAR HEAD, INTERNAL FIXATION MEDIAL COLUMN LEFT FOOT;  Surgeon: Newt Minion, MD;  Location: [facility];  Service: Orthopedics;  Laterality: Left;  . POLYPECTOMY N/A 05/14/2014   Procedure: ENDOMETRIAL POLYPECTOMY;  Surgeon: Jonnie Kind, MD;  Location: [facility];  Service: Gynecology;  Laterality: N/A;  . STUMP REVISION Left 08/31/2017   Procedure: REVISION LEFT BELOW KNEE AMPUTATION;  Surgeon: Newt Minion, MD;  Location: [facility];  Service: Orthopedics;  Laterality: Left;    There were no vitals filed for this visit.  Subjective Assessment - 03/10/18 0935    Subjective  Pt went to [employer] and states that [name] added a new screw to the base of the liner. HEP is going well.    Patient is accompained by:  Family member    Pertinent History  L TTA, bradycardia, depression, lymphedema, DM2, HLD, HTN, CAD, MI, CABG, CA basal cell  face, bowel resection,     Limitations  Lifting;Standing;Walking;House hold activities    Patient Stated Goals  To use prosthesis to get in/out cars, bathrooms, access community & home    Currently in Pain?  No/denies        [facility] Adult PT Treatment/Exercise - 03/10/18 0001      Therapeutic Activites    Therapeutic Activities  Other Therapeutic Activities    Other Therapeutic Activities  Car transfer training into daughters SUV with min assist to CGA using RW to back up to front seat and grab bar to pull up and rotate into seat properly x2 trials with daughter & husband present.       Prosthetics   Current prosthetic wear tolerance (days/week)   daily  Current prosthetic wear tolerance (#hours/day)   5hrs/2xday    Residual limb condition   no issues per pt.    Education Provided  Residual limb care;Prosthetic cleaning;Proper Donning;Proper wear schedule/adjustment    Person(s) Educated  Patient;Spouse;Child(ren)    Education Method  Explanation;Verbal cues    Education Method  Verbalized understanding    Donning Prosthesis  Supervision    Doffing Prosthesis  Modified independent (device/increased time)         PT Education - 03/10/18 1243    Education Details  Educated pt and family on car transfer and proper use of gait belt due to daughter stating she is going to purchase a cloth belt.     Person(s) Educated  Patient;Spouse;Child(ren)    Methods  Explanation;Demonstration;Tactile cues;Verbal cues    Comprehension  Verbalized understanding;Returned demonstration;Verbal cues required;Tactile cues required;Need further instruction       PT Short Term Goals - 03/10/18 0937      PT SHORT TERM GOAL #1   Title  Patient verbalizes proper cleaning & demonstrates proper donning of prosthesis. (All STGs Target Date: 03/14/2018)      Baseline  Pt verbalizes proper cleaning/donning of prosthesis.     Time  1    Period  Months    Status  Achieved      PT SHORT TERM GOAL #2   Title   Patient tolerates prosthesis wear >8 hrs total / day without skin issues.     Baseline  Pt currently wearing prosthesis 5hrs/2xday with no skin issues.     Time  1    Period  Months    Status  Achieved      PT SHORT TERM GOAL #3   Title  Patient performs standing balance with RW including reaching 10", picking up items from floor and managing clothes with supervision.     Baseline  Pt able to reach 10", pick up items off floor with RW/prosthesis with Supervision.     Time  1    Period  Months    Status  Achieved      PT SHORT TERM GOAL #4   Title  Patient ambulates 15' with RW & prosthesis with supervision.     Baseline  Pt ambulates 115' with RW/prosthesis, Supervision.     Time  1    Period  Months    Status  Achieved      PT SHORT TERM GOAL #5   Title  Patient negotiates ramps & curbs with RW & prosthesis with minA.     Baseline  Pt negotiates ramps, curbs with RW/prosthesis, Min A.     Time  1    Period  Months    Status  Achieved        PT Long Term Goals - 02/23/18 1135      PT LONG TERM GOAL #1   Title  Patient demonstrates and verbalizes proper prosthetic care to enable safe use of prosthesis (All LTGs Target Date: 05/12/2018)    Time  3    Period  Months    Status  On-going    Target Date  05/12/18      PT LONG TERM GOAL #2   Title  Patient tolerates wear of prosthesis >90% of awake hours without skin or limb pain issues to enable function throughout her day.     Time  3    Period  Months    Status  On-going    Target Date  05/12/18  PT LONG TERM GOAL #3   Title  Julia West >36/56    Time  3    Period  Months    Status  On-going    Target Date  05/12/18      PT LONG TERM GOAL #4   Title  Patient ambulates 300' with LRAD & prosthesis modified independent for basic community mobility.     Time  3    Period  Months    Status  On-going    Target Date  05/12/18      PT LONG TERM GOAL #5   Title  Patient negotiates ramps, curbs & stairs with  LRAD & prosthesis modified independent for community access.     Time  3    Period  Months    Status  On-going    Target Date  05/12/18            Plan - 03/10/18 1249    Clinical Impression Statement  Todays skilled session focused on assessing STG's with pt achieving 5/5 goals, car transfer training and pt/family education on proper use of gait. Pt is progressing towards goals and should benefit from continued PT sessions to progress towards LTG's.     Rehab Potential  Good    PT Frequency  2x / week    PT Duration  Other (comment)   13 weeks (90 days)   PT Treatment/Interventions  ADLs/Self Care Home Management;DME Instruction;Gait training;Stair training;Functional mobility training;Therapeutic activities;Therapeutic exercise;Balance training;Neuromuscular re-education;Patient/family education;Prosthetic Training;Manual techniques;Vestibular    PT Next Visit Plan  work towards [facility], review prosthetic care. sit to/from stand from standard ht chairs without armrests, increase activity tolerance during gait with RW.     Consulted and Agree with Plan of Care  Patient;Family member/caregiver    Family Member Consulted  husband who is deaf       Patient will benefit from skilled therapeutic intervention in order to improve the following deficits and impairments:  Abnormal gait, Decreased activity tolerance, Decreased balance, Cardiopulmonary status limiting activity, Decreased coordination, Decreased endurance, Decreased knowledge of use of DME, Decreased mobility, Decreased range of motion, Decreased skin integrity, Decreased strength, Dizziness, Impaired flexibility, Increased edema, Obesity, Prosthetic Dependency, Postural dysfunction  Visit Diagnosis: Other abnormalities of gait and mobility  Unsteadiness on feet  Abnormal posture  Muscle weakness (generalized)     Problem List Patient Active Problem List   Diagnosis Date Noted  . Dyslipidemia 11/23/2017  . Below knee  amputation status, left 08/31/2017  . Dehiscence of amputation stump ([place]) 08/29/2017  . Infection of amputation stump, left lower extremity ([place]) 06/19/2017  . Bradycardia 05/07/2017  . Depression 05/07/2017  . Lymphedema 05/07/2017  . Charcot foot due to diabetes mellitus ([place]) 02/09/2017  . 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 Post-menopausal bleeding 04/15/2014  . Endometrial polyp 04/09/2014  . Ventral hernia 03/06/2012  . Open abdominal wall wound 03/06/2012  . Hyperlipemia 11/12/2008  . OBESITY, MORBID 11/12/2008  . Essential hypertension 11/12/2008  . CAD (coronary artery disease) 11/12/2008    , PTA   A  03/10/2018, 12:58 PM  [facility] [address] [place], [place], [address] Phone: [phone]   Fax:  [phone]  Name: Julia West MRN: [id number] Date of Birth: 06/27/1955

## 2018-03-13 ENCOUNTER — Other Ambulatory Visit: Payer: Self-pay | Admitting: Family Medicine

## 2018-03-14 ENCOUNTER — Ambulatory Visit: Payer: Medicare Other | Admitting: Physical Therapy

## 2018-03-14 ENCOUNTER — Encounter: Payer: Self-pay | Admitting: Physical Therapy

## 2018-03-14 DIAGNOSIS — R293 Abnormal posture: Secondary | ICD-10-CM

## 2018-03-14 DIAGNOSIS — R2681 Unsteadiness on feet: Secondary | ICD-10-CM | POA: Diagnosis not present

## 2018-03-14 DIAGNOSIS — M25662 Stiffness of left knee, not elsewhere classified: Secondary | ICD-10-CM | POA: Diagnosis not present

## 2018-03-14 DIAGNOSIS — M25661 Stiffness of right knee, not elsewhere classified: Secondary | ICD-10-CM

## 2018-03-14 DIAGNOSIS — M6281 Muscle weakness (generalized): Secondary | ICD-10-CM

## 2018-03-14 DIAGNOSIS — R2689 Other abnormalities of gait and mobility: Secondary | ICD-10-CM | POA: Diagnosis not present

## 2018-03-15 NOTE — Therapy (Signed)
[facility] [address] [place], [place], [address] Phone: [phone]   Fax:  [phone]  Physical Therapy Treatment  Patient Details  Name: Julia West MRN: [id number] Date of Birth: January 16, 1956 Referring Provider (PT): Meridee Score, MD   Encounter Date: 03/14/2018  PT End of Session - 03/14/18 1110    Visit Number  9    Number of Visits  26    Date for PT Re-Evaluation  05/12/18    Authorization Type  Medicare & Medicaid     PT Start Time  0930    PT Stop Time  9983    PT Time Calculation (min)  45 min    Equipment Utilized During Treatment  Gait belt    Activity Tolerance  Patient tolerated treatment well;Patient limited by fatigue    Behavior During Therapy  [facility] for tasks assessed/performed       Past Medical History:  Diagnosis Date  . Acute myocardial infarction    with a ruptured plaque in the circumflex in 2003  . Anxiety   . 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 Complication of anesthesia    states low O2 sats post-op 11/13, always slow to awaken  . Degenerative joint disease   . 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 Dyspnea    with activity- car to house - steps  . HTN (hypertension)   . Hyperlipidemia LDL goal <70   . Internal and external hemorrhoids without complication   . Lymphedema    right side of body  . Lymphedema of arm    right  . Morbid obesity ([place])   . Surgical wound, non healing ABDOMINAL    has wound vac @ 125 mm Hg    Past Surgical History:  Procedure Laterality Date  . AMPUTATION Left 05/06/2017   Procedure: BELOW KNEE AMPUTATION;  Surgeon: Newt Minion, MD;  Location: [facility];  Service: Orthopedics;  Laterality: Left;  . APPLICATION OF WOUND VAC    . BOWEL RESECTION  02/07/2012   Procedure: SMALL BOWEL RESECTION;  Surgeon: Gayland Curry, MD,FACS;  Location: [facility];  Service: General;;  . CARDIOVASCULAR STRESS TEST  01-17-2012  DR HOCHREIN   LOW RISK  NUCLEAR TEST  . CESAREAN SECTION     x 4 in remote past  . CHOLECYSTECTOMY    . CORONARY ARTERY BYPASS GRAFT  2003   by Dr. Servando Snare. LIMA to the LAD, free RIMA to the circumflex. Stress perfusion study December 2009 with no high-risk areas of ischemia. She has a well-preserved ejection fraction  . HYSTEROSCOPY W/D&C N/A 05/14/2014   Procedure: DILATATION AND CURETTAGE (no specimen); HYSTEROSCOPY;  Surgeon: Jonnie Kind, MD;  Location: [facility];  Service: Gynecology;  Laterality: N/A;  . I&D EXTREMITY Left 06/21/2017   Procedure: IRRIGATION AND DEBRIDEMENT OF LEFT LEG AMPUTATION SITE;  Surgeon: Wallace Going, DO;  Location: [facility];  Service: Plastics;  Laterality: Left;  . INCISION AND DRAINAGE OF WOUND  04/17/2012   Procedure: IRRIGATION AND DEBRIDEMENT WOUND;  Surgeon: Theodoro Kos, DO;  Location: [facility];  Service: Plastics;  Laterality: N/A;  OF ABDOMINAL WOUND, SURGICAL PREP AND PLACEMENT OF VAC, REMOVAL FOREHEAD SKIN LESION  . INCISION AND DRAINAGE OF WOUND N/A 04/24/2012   Procedure: IRRIGATION AND DEBRIDEMENT WOUND;  Surgeon: Theodoro Kos, DO;  Location: [facility];  Service: Plastics;  Laterality: N/A;  I & D ABDOMINAL  WOUND WITH VAC AND ACELL  . INCISION AND DRAINAGE OF WOUND N/A 05/01/2012   Procedure: IRRIGATION AND DEBRIDEMENT WOUND;  Surgeon: Theodoro Kos, DO;  Location: [facility];  Service: Plastics;  Laterality: N/A;  WITH SURGICAL PREP AND PLACEMENT OF VAC  . INCISION AND DRAINAGE OF WOUND N/A 05/08/2012   Procedure: IRRIGATION AND DEBRIDEMENT OF ABD WOUND SURGICAL PREP AND PLACEMENT OF VAC ;  Surgeon: Theodoro Kos, DO;  Location: [facility];  Service: Plastics;  Laterality: N/A;  IRRIGATION AND DEBRIDEMENT OF ABD WOUND SURGICAL PREP AND PLACEMENT OF VAC   . INCISION AND DRAINAGE OF WOUND N/A 05/15/2012   Procedure: IRRIGATION AND DEBRIDEMENT OF ABDOMINAL WOUND WITH POSSIBLE SURGICAL PREP AND PLACEMENT OF VAC;   Surgeon: Theodoro Kos, DO;  Location: [facility];  Service: Plastics;  Laterality: N/A;  . INCISION AND DRAINAGE OF WOUND N/A 05/22/2012   Procedure: IRRIGATION AND DEBRIDEMENT OF ABODOMINAL WOUND WITH  SURGICAL PREP AND VAC PLACEMENT;  Surgeon: Theodoro Kos, DO;  Location: [facility];  Service: Plastics;  Laterality: N/A;  . INCISION AND DRAINAGE OF WOUND N/A 06/28/2012   Procedure: IRRIGATION AND DEBRIDEMENT OF ABDOMINAL ULCER SURGICAL PREP AND PLACEMENT OF ACELL AND VAC;  Surgeon: Theodoro Kos, DO;  Location: [facility];  Service: Plastics;  Laterality: N/A;  . INCISIONAL HERNIA REPAIR  02/07/2012   Procedure: HERNIA REPAIR INCISIONAL;  Surgeon: Gayland Curry, MD,FACS;  Location: [facility];  Service: General;;  Open, Primary repair, strangulated Incisional hernia.  Marland Kitchen LESION REMOVAL  04/17/2012   Procedure: LESION REMOVAL;  Surgeon: Theodoro Kos, DO;  Location: [facility];  Service: Plastics;  Laterality: N/A;   CENTER OF FOREHEAD, REMOVAL FORHEAD SKIN LESION  . ORIF TOE FRACTURE Left 02/09/2017   Procedure: EXCISION TALAR HEAD, INTERNAL FIXATION MEDIAL COLUMN LEFT FOOT;  Surgeon: Newt Minion, MD;  Location: [facility];  Service: Orthopedics;  Laterality: Left;  . POLYPECTOMY N/A 05/14/2014   Procedure: ENDOMETRIAL POLYPECTOMY;  Surgeon: Jonnie Kind, MD;  Location: [facility];  Service: Gynecology;  Laterality: N/A;  . STUMP REVISION Left 08/31/2017   Procedure: REVISION LEFT BELOW KNEE AMPUTATION;  Surgeon: Newt Minion, MD;  Location: [facility];  Service: Orthopedics;  Laterality: Left;    There were no vitals filed for this visit.  Subjective Assessment - 03/14/18 0930    Subjective  She is wearing prosthesis 5hrs 2x/day since Friday.     Patient is accompained by:  Family member    Pertinent History  L TTA, bradycardia, depression, lymphedema, DM2, HLD, HTN, CAD, MI, CABG, CA basal cell face, bowel resection,     Limitations   Lifting;Standing;Walking;House hold activities    Patient Stated Goals  To use prosthesis to get in/out cars, bathrooms, access community & home    Currently in Pain?  No/denies                       [facility] Adult PT Treatment/Exercise - 03/14/18 0930      Transfers   Transfers  Sit to Stand;Stand to Sit    Sit to Stand  5: Supervision;With upper extremity assist;With armrests;From chair/3-in-1   to RW   Stand to Sit  5: Supervision;With upper extremity assist;With armrests;To chair/3-in-1   from RW     Ambulation/Gait   Ambulation/Gait  Yes    Ambulation/Gait Assistance  5: Supervision    Ambulation/Gait Assistance Details  verbal & tactile  cues on "powering up" on RLE in stance and upright posture.     Ambulation Distance (Feet)  130 Feet    Assistive device  Rolling walker;Prosthesis    Gait Pattern  Step-through pattern;Decreased step length - right;Decreased stance time - left;Right hip hike;Left flexed knee in stance;Right flexed knee in stance;Antalgic;Lateral hip instability    Ambulation Surface  Indoor;Level    Ramp  4: Min assist   RW & prosthesis   Ramp Details (indicate cue type and reason)  cues on posture & wt shift    Curb  4: Min assist   RW & prosthesis   Curb Details (indicate cue type and reason)  cues on sequence & wt shift      Therapeutic Activites    Therapeutic Activities  --    Other Therapeutic Activities  --      Exercises   Exercises  Other Exercises    Other Exercises   seated in w/c with RLE on chair & foot against wall: closed chain leg press 5 sec hold 10 reps. Pt & dtr verbalize understanding how to perform at home.       Prosthetics   Prosthetic Care Comments   PT instructed to increase wear to 6 hrs 2x/day with 2 hrs off between wears and drying limb/liner midpoint 6hrs.     Current prosthetic wear tolerance (days/week)   daily    Current prosthetic wear tolerance (#hours/day)   5hrs/2xday   increase to 6hrs 2x/day   Residual  limb condition   No issues    Education Provided  Residual limb care;Prosthetic cleaning;Proper Donning;Proper wear schedule/adjustment    Person(s) Educated  Patient;Child(ren);Spouse    Education Method  Explanation;Demonstration;Verbal cues    Education Method  Verbalized understanding;Verbal cues required;Needs further instruction    Donning Prosthesis  Supervision    Doffing Prosthesis  Modified independent (device/increased time)             PT Education - 03/14/18 1000    Education Details  PT reviewed video of car transfer into [place] with high seat which appeared unsafe when she released RW without her buttocks on seat. PT demo using step under right leg to lift buttocks onto seat prior to releasing RW. Increasing activity level daily with higher frequency of short walks room to room, 1-2 long walks (her max) and 4-6 medium walks with family supervision.     Person(s) Educated  Patient;Child(ren)    Methods  Explanation;Demonstration;Verbal cues    Comprehension  Verbalized understanding;Need further instruction       PT Short Term Goals - 03/14/18 1200      PT SHORT TERM GOAL #1   Title  Patient verbalizes adjusting ply socks to adjust limb volume changes. (All updated STGs Target Date: 04/14/2018)    Time  1    Period  Months    Status  New    Target Date  04/14/18      PT SHORT TERM GOAL #2   Title  Patient tolerates prosthesis wear >90% of awake hours with PT guidance for skin & limb pain issues.     Time  1    Period  Months    Status  New    Target Date  04/14/18      PT SHORT TERM GOAL #3   Title  Patient able to stand 2 minutes without UE support with supervision.     Time  1    Period  Months  Status  New    Target Date  04/14/18      PT SHORT TERM GOAL #4   Title  Patient ambulates 200' with RW & prosthesis with supervision.     Time  1    Period  Months    Status  Revised    Target Date  04/14/18      PT SHORT TERM GOAL #5   Title  Patient  negotiates ramps & curbs with RW & prosthesis with supervision.     Time  1    Period  Months    Status  Revised    Target Date  04/14/18        PT Long Term Goals - 02/23/18 1135      PT LONG TERM GOAL #1   Title  Patient demonstrates and verbalizes proper prosthetic care to enable safe use of prosthesis (All LTGs Target Date: 05/12/2018)    Time  3    Period  Months    Status  On-going    Target Date  05/12/18      PT LONG TERM GOAL #2   Title  Patient tolerates wear of prosthesis >90% of awake hours without skin or limb pain issues to enable function throughout her day.     Time  3    Period  Months    Status  On-going    Target Date  05/12/18      PT LONG TERM GOAL #3   Title  Lillie Fragmin >36/56    Time  3    Period  Months    Status  On-going    Target Date  05/12/18      PT LONG TERM GOAL #4   Title  Patient ambulates 300' with LRAD & prosthesis modified independent for basic community mobility.     Time  3    Period  Months    Status  On-going    Target Date  05/12/18      PT LONG TERM GOAL #5   Title  Patient negotiates ramps, curbs & stairs with LRAD & prosthesis modified independent for community access.     Time  3    Period  Months    Status  On-going    Target Date  05/12/18            Plan - 03/14/18 1200    Clinical Impression Statement  Patient is progressing with mobility with prosthesis and increasing activity level outside of PT. She appears to understand PT recommendations for modifying car transfer to include using step under RLE to get buttocks onto seat before releasing the RW.     Rehab Potential  Good    PT Frequency  2x / week    PT Duration  Other (comment)   13 weeks (90 days)   PT Treatment/Interventions  ADLs/Self Care Home Management;DME Instruction;Gait training;Stair training;Functional mobility training;Therapeutic activities;Therapeutic exercise;Balance training;Neuromuscular re-education;Patient/family  education;Prosthetic Training;Manual techniques;Vestibular    PT Next Visit Plan  work towards updated STGs, review prosthetic care. standing balance activities.  sit to/from stand from standard ht chairs without armrests, increase activity tolerance during gait with RW.     Consulted and Agree with Plan of Care  Patient;Family member/caregiver    Family Member Consulted  husband who is deaf       Patient will benefit from skilled therapeutic intervention in order to improve the following deficits and impairments:  Abnormal gait, Decreased activity tolerance, Decreased balance, Cardiopulmonary status limiting  activity, Decreased coordination, Decreased endurance, Decreased knowledge of use of DME, Decreased mobility, Decreased range of motion, Decreased skin integrity, Decreased strength, Dizziness, Impaired flexibility, Increased edema, Obesity, Prosthetic Dependency, Postural dysfunction  Visit Diagnosis: Other abnormalities of gait and mobility  Unsteadiness on feet  Abnormal posture  Muscle weakness (generalized)  Stiffness of left knee, not elsewhere classified  Stiffness of right knee, not elsewhere classified     Problem List Patient Active Problem List   Diagnosis Date Noted  . Dyslipidemia 11/23/2017  . Below knee amputation status, left 08/31/2017  . Dehiscence of amputation stump ([facility]) 08/29/2017  . Infection of amputation stump, left lower extremity ([facility]) 06/19/2017  . Bradycardia 05/07/2017  . Depression 05/07/2017  . Lymphedema 05/07/2017  . Charcot foot due to diabetes mellitus ([facility]) 02/09/2017  . 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 Post-menopausal bleeding 04/15/2014  . Endometrial polyp 04/09/2014  . Ventral hernia 03/06/2012  . Open abdominal wall wound 03/06/2012  . Hyperlipemia 11/12/2008  . OBESITY, MORBID 11/12/2008  . Essential hypertension 11/12/2008  . CAD (coronary artery disease) 11/12/2008    Jamey Reas PT,  DPT 03/15/2018, 1:06 PM  [facility] [address] [place], [place], [address] Phone: [phone]   Fax:  [phone]  Name: ELVERDA WENDEL MRN: [id number] Date of Birth: 31-Aug-1955

## 2018-03-16 ENCOUNTER — Encounter: Payer: Self-pay | Admitting: *Deleted

## 2018-03-16 NOTE — Telephone Encounter (Signed)
[id number] 03/16/18

## 2018-03-16 NOTE — Telephone Encounter (Signed)
Stacks. NTBS. Last OV 08/24/17

## 2018-03-17 ENCOUNTER — Ambulatory Visit: Payer: Medicare Other | Attending: Family Medicine

## 2018-03-17 DIAGNOSIS — M25662 Stiffness of left knee, not elsewhere classified: Secondary | ICD-10-CM | POA: Insufficient documentation

## 2018-03-17 DIAGNOSIS — M6281 Muscle weakness (generalized): Secondary | ICD-10-CM | POA: Diagnosis not present

## 2018-03-17 DIAGNOSIS — R2681 Unsteadiness on feet: Secondary | ICD-10-CM | POA: Insufficient documentation

## 2018-03-17 DIAGNOSIS — M25661 Stiffness of right knee, not elsewhere classified: Secondary | ICD-10-CM | POA: Insufficient documentation

## 2018-03-17 DIAGNOSIS — R293 Abnormal posture: Secondary | ICD-10-CM | POA: Diagnosis not present

## 2018-03-17 DIAGNOSIS — R2689 Other abnormalities of gait and mobility: Secondary | ICD-10-CM | POA: Diagnosis not present

## 2018-03-17 NOTE — Therapy (Deleted)
[facility] [address] [place], [place], [address] Phone: [phone]   Fax:  [phone]  Patient Details  Name: Julia West MRN: [id number] Date of Birth: 11-17-1955 Referring Provider:  [name], MD  Encounter Date: 03/17/2018   [name] 03/17/2018, 3:49 PM  [facility] [address] [place], [place], [address] Phone: [phone]   Fax:  [phone]

## 2018-03-17 NOTE — Therapy (Addendum)
[facility] [address] [place], [place], [address] Phone: [phone]   Fax:  [phone]  Physical Therapy Treatment  Patient Details  Name: Julia West MRN: [id number] Date of Birth: 1956/02/06 Referring Provider (PT): Meridee Score, MD  Progress Note Reporting Period 02/13/2018 to 03/17/2018  See note below for Objective Data and Assessment of Progress/Goals.   Jamey Reas, PT, DPT PT Specializing in [place] 03/21/18 10:37 AM Phone:  [phone]  Fax:  [phone] [facility] [address] [place], [place], [place] [address]     Encounter Date: 03/17/2018  PT End of Session - 03/17/18 0943    Visit Number  10    Number of Visits  26    Date for PT Re-Evaluation  05/12/18    Authorization Type  Medicare & Medicaid     PT Start Time  [phone]    PT Stop Time  1018    PT Time Calculation (min)  42 min    Equipment Utilized During Treatment  Gait belt    Activity Tolerance  Patient tolerated treatment well;Patient limited by fatigue    Behavior During Therapy  WFL for tasks assessed/performed       Past Medical History:  Diagnosis Date  . Acute myocardial infarction    with a ruptured plaque in the circumflex in 2003  . Anxiety   . Cancer ([place])    basal cell face  . Complication of anesthesia    states low O2 sats post-op 11/13, always slow to awaken  . Degenerative joint disease   . 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 Dyspnea    with activity- car to house - steps  . HTN (hypertension)   . Hyperlipidemia LDL goal <70   . Internal and external hemorrhoids without complication   . Lymphedema    right side of body  . Lymphedema of arm    right  . Morbid obesity ([place])   . Surgical wound, non healing ABDOMINAL    has wound vac @ 125 mm Hg    Past Surgical History:  Procedure Laterality Date  . AMPUTATION Left 05/06/2017   Procedure: BELOW  KNEE AMPUTATION;  Surgeon: Newt Minion, MD;  Location: [facility];  Service: Orthopedics;  Laterality: Left;  . APPLICATION OF WOUND VAC    . BOWEL RESECTION  02/07/2012   Procedure: SMALL BOWEL RESECTION;  Surgeon: Gayland Curry, MD,FACS;  Location: [facility];  Service: General;;  . CARDIOVASCULAR STRESS TEST  01-17-2012  DR HOCHREIN   LOW RISK NUCLEAR TEST  . CESAREAN SECTION     x 4 in remote past  . CHOLECYSTECTOMY    . CORONARY ARTERY BYPASS GRAFT  2003   by Dr. Servando Snare. LIMA to the LAD, free RIMA to the circumflex. Stress perfusion study December 2009 with no high-risk areas of ischemia. She has a well-preserved ejection fraction  . HYSTEROSCOPY W/D&C N/A 05/14/2014   Procedure: DILATATION AND CURETTAGE (no specimen); HYSTEROSCOPY;  Surgeon: Jonnie Kind, MD;  Location: [facility];  Service: Gynecology;  Laterality: N/A;  . I&D EXTREMITY Left 06/21/2017   Procedure: IRRIGATION AND DEBRIDEMENT OF LEFT LEG AMPUTATION SITE;  Surgeon: Wallace Going, DO;  Location: [facility];  Service: Plastics;  Laterality: Left;  . INCISION AND DRAINAGE OF WOUND  04/17/2012   Procedure: IRRIGATION AND DEBRIDEMENT WOUND;  Surgeon: Theodoro Kos, DO;  Location: [facility];  Service:  Plastics;  Laterality: N/A;  OF ABDOMINAL WOUND, SURGICAL PREP AND PLACEMENT OF VAC, REMOVAL FOREHEAD SKIN LESION  . INCISION AND DRAINAGE OF WOUND N/A 04/24/2012   Procedure: IRRIGATION AND DEBRIDEMENT WOUND;  Surgeon: Theodoro Kos, DO;  Location: [facility];  Service: Plastics;  Laterality: N/A;  I & D ABDOMINAL WOUND WITH VAC AND ACELL  . INCISION AND DRAINAGE OF WOUND N/A 05/01/2012   Procedure: IRRIGATION AND DEBRIDEMENT WOUND;  Surgeon: Theodoro Kos, DO;  Location: [facility];  Service: Plastics;  Laterality: N/A;  WITH SURGICAL PREP AND PLACEMENT OF VAC  . INCISION AND DRAINAGE OF WOUND N/A 05/08/2012   Procedure: IRRIGATION AND DEBRIDEMENT OF ABD WOUND SURGICAL PREP AND PLACEMENT OF  VAC ;  Surgeon: Theodoro Kos, DO;  Location: [facility];  Service: Plastics;  Laterality: N/A;  IRRIGATION AND DEBRIDEMENT OF ABD WOUND SURGICAL PREP AND PLACEMENT OF VAC   . INCISION AND DRAINAGE OF WOUND N/A 05/15/2012   Procedure: IRRIGATION AND DEBRIDEMENT OF ABDOMINAL WOUND WITH POSSIBLE SURGICAL PREP AND PLACEMENT OF VAC;  Surgeon: Theodoro Kos, DO;  Location: [facility];  Service: Plastics;  Laterality: N/A;  . INCISION AND DRAINAGE OF WOUND N/A 05/22/2012   Procedure: IRRIGATION AND DEBRIDEMENT OF ABODOMINAL WOUND WITH  SURGICAL PREP AND VAC PLACEMENT;  Surgeon: Theodoro Kos, DO;  Location: [facility];  Service: Plastics;  Laterality: N/A;  . INCISION AND DRAINAGE OF WOUND N/A 06/28/2012   Procedure: IRRIGATION AND DEBRIDEMENT OF ABDOMINAL ULCER SURGICAL PREP AND PLACEMENT OF ACELL AND VAC;  Surgeon: Theodoro Kos, DO;  Location: [facility];  Service: Plastics;  Laterality: N/A;  . INCISIONAL HERNIA REPAIR  02/07/2012   Procedure: HERNIA REPAIR INCISIONAL;  Surgeon: Gayland Curry, MD,FACS;  Location: [facility];  Service: General;;  Open, Primary repair, strangulated Incisional hernia.  Marland Kitchen LESION REMOVAL  04/17/2012   Procedure: LESION REMOVAL;  Surgeon: Theodoro Kos, DO;  Location: [facility];  Service: Plastics;  Laterality: N/A;   CENTER OF FOREHEAD, REMOVAL FORHEAD SKIN LESION  . ORIF TOE FRACTURE Left 02/09/2017   Procedure: EXCISION TALAR HEAD, INTERNAL FIXATION MEDIAL COLUMN LEFT FOOT;  Surgeon: Newt Minion, MD;  Location: [facility];  Service: Orthopedics;  Laterality: Left;  . POLYPECTOMY N/A 05/14/2014   Procedure: ENDOMETRIAL POLYPECTOMY;  Surgeon: Jonnie Kind, MD;  Location: [facility];  Service: Gynecology;  Laterality: N/A;  . STUMP REVISION Left 08/31/2017   Procedure: REVISION LEFT BELOW KNEE AMPUTATION;  Surgeon: Newt Minion, MD;  Location: [facility];  Service: Orthopedics;  Laterality: Left;    There were  no vitals filed for this visit.  Subjective Assessment - 03/17/18 0940    Subjective  Pt is wearing proshesis 5hrs/2xday, pt is walking around house 3x day. Her daughter is taking her shopping this weekend and she will be using her RW.     Patient is accompained by:  Family member    Pertinent History  L TTA, bradycardia, depression, lymphedema, DM2, HLD, HTN, CAD, MI, CABG, CA basal cell face, bowel resection,     Limitations  Lifting;Standing;Walking;House hold activities    Patient Stated Goals  To use prosthesis to get in/out cars, bathrooms, access community & home    Currently in Pain?  No/denies         [facility] Adult PT Treatment/Exercise - 03/17/18 0944      Ambulation/Gait   Ambulation/Gait  Yes    Ambulation/Gait Assistance  5: Supervision    Ambulation/Gait Assistance Details  VC's for initial instruction with rollator including hand placement/locking breaks prior to sitting/distance from AD, upright posture and heel strike.     Ambulation Distance (Feet)  460 Feet   345 with RW, 115 with rollator   Assistive device  Rolling walker;Prosthesis    Gait Pattern  Step-through pattern;Decreased step length - right;Decreased stance time - left;Right hip hike;Left flexed knee in stance;Right flexed knee in stance;Antalgic;Lateral hip instability    Ambulation Surface  Level;Indoor      High Level Balance   High Level Balance Activities  Side stepping;Backward walking;Marching forwards    High Level Balance Comments  BUE to SUE on outside of parallel bars with no LOB noted, minimal instability when completing bkwds walking.       Prosthetics   Prosthetic Care Comments   Pt wearing 1 ply this morning due to some swelling but will increase ply later, 6hrs/2xday with no issues.     Current prosthetic wear tolerance (days/week)   daily    Current prosthetic wear tolerance (#hours/day)   6hrs/2xday    Residual limb condition   No issues    Education Provided  Correct ply sock  adjustment;Proper wear schedule/adjustment    Person(s) Educated  Patient;Spouse    Education Method  Explanation    Education Method  Verbalized understanding    Donning Prosthesis  Modified independent (device/increased time)    Doffing Prosthesis  Independent          PT Short Term Goals - 03/14/18 1200      PT SHORT TERM GOAL #1   Title  Patient verbalizes adjusting ply socks to adjust limb volume changes. (All updated STGs Target Date: 04/14/2018)    Time  1    Period  Months    Status  New    Target Date  04/14/18      PT SHORT TERM GOAL #2   Title  Patient tolerates prosthesis wear >90% of awake hours with PT guidance for skin & limb pain issues.     Time  1    Period  Months    Status  New    Target Date  04/14/18      PT SHORT TERM GOAL #3   Title  Patient able to stand 2 minutes without UE support with supervision.     Time  1    Period  Months    Status  New    Target Date  04/14/18      PT SHORT TERM GOAL #4   Title  Patient ambulates 200' with RW & prosthesis with supervision.     Time  1    Period  Months    Status  Revised    Target Date  04/14/18      PT SHORT TERM GOAL #5   Title  Patient negotiates ramps & curbs with RW & prosthesis with supervision.     Time  1    Period  Months    Status  Revised    Target Date  04/14/18        PT Long Term Goals - 02/23/18 1135      PT LONG TERM GOAL #1   Title  Patient demonstrates and verbalizes proper prosthetic care to enable safe use of prosthesis (All LTGs Target Date: 05/12/2018)    Time  3    Period  Months    Status  On-going    Target Date  05/12/18      PT LONG TERM GOAL #2   Title  Patient tolerates wear of prosthesis >90% of awake hours without skin or limb pain issues to enable function throughout her day.     Time  3    Period  Months    Status  On-going    Target Date  05/12/18      PT LONG TERM GOAL #3   Title  Lillie Fragmin >36/56    Time  3    Period  Months    Status   On-going    Target Date  05/12/18      PT LONG TERM GOAL #4   Title  Patient ambulates 300' with LRAD & prosthesis modified independent for basic community mobility.     Time  3    Period  Months    Status  On-going    Target Date  05/12/18      PT LONG TERM GOAL #5   Title  Patient negotiates ramps, curbs & stairs with LRAD & prosthesis modified independent for community access.     Time  3    Period  Months    Status  On-going    Target Date  05/12/18            Plan - 03/17/18 1546    Clinical Impression Statement  Todays skilled session focused on gait training for increased distance with RW/prosthesis with minimal fatigue, initiated gait with rollator walker with instruction on proper use, prosthetic care and high level balance at parallel bars with BUE to SUE support required. Pt is making progress and should benefit from continued PT sessions to progress towards goals.     Rehab Potential  Good    PT Frequency  2x / week    PT Duration  Other (comment)   13 weeks (90 days)   PT Treatment/Interventions  ADLs/Self Care Home Management;DME Instruction;Gait training;Stair training;Functional mobility training;Therapeutic activities;Therapeutic exercise;Balance training;Neuromuscular re-education;Patient/family education;Prosthetic Training;Manual techniques;Vestibular    PT Next Visit Plan  work towards updated STGs, review prosthetic care. standing balance activities, gait with rollator.  sit to/from stand from standard ht chairs without armrests, increase activity tolerance during gait with RW.     Consulted and Agree with Plan of Care  Patient;Family member/caregiver    Family Member Consulted  husband who is deaf       Patient will benefit from skilled therapeutic intervention in order to improve the following deficits and impairments:  Abnormal gait, Decreased activity tolerance, Decreased balance, Cardiopulmonary status limiting activity, Decreased coordination,  Decreased endurance, Decreased knowledge of use of DME, Decreased mobility, Decreased range of motion, Decreased skin integrity, Decreased strength, Dizziness, Impaired flexibility, Increased edema, Obesity, Prosthetic Dependency, Postural dysfunction  Visit Diagnosis: Other abnormalities of gait and mobility  Unsteadiness on feet  Abnormal posture  Muscle weakness (generalized)     Problem List Patient Active Problem List   Diagnosis Date Noted  . Dyslipidemia 11/23/2017  . Below knee amputation status, left 08/31/2017  . Dehiscence of amputation stump ([facility]) 08/29/2017  . Infection of amputation stump, left lower extremity ([facility]) 06/19/2017  . Bradycardia 05/07/2017  . Depression 05/07/2017  . Lymphedema 05/07/2017  . Charcot foot due to diabetes mellitus ([facility]) 02/09/2017  . 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 Post-menopausal bleeding 04/15/2014  . Endometrial polyp 04/09/2014  . Ventral hernia 03/06/2012  . Open abdominal wall wound 03/06/2012  . Hyperlipemia 11/12/2008  .  OBESITY, MORBID 11/12/2008  . Essential hypertension 11/12/2008  . CAD (coronary artery disease) 11/12/2008   Giavana Rooke, PTA  [facility] Gro[facility] A Gisella Alwine 03/17/2018, 3:50 PM  [facility] [address] [place], [place], [address] Phone: [phone]   Fax:  [phone]  Name: Julia West MRN: [id number] Date of Birth: [birth year]

## 2018-03-20 ENCOUNTER — Encounter: Payer: Self-pay | Admitting: Physical Therapy

## 2018-03-20 ENCOUNTER — Ambulatory Visit: Payer: Medicare Other | Admitting: Physical Therapy

## 2018-03-20 DIAGNOSIS — R293 Abnormal posture: Secondary | ICD-10-CM

## 2018-03-20 DIAGNOSIS — M6281 Muscle weakness (generalized): Secondary | ICD-10-CM

## 2018-03-20 DIAGNOSIS — M25661 Stiffness of right knee, not elsewhere classified: Secondary | ICD-10-CM | POA: Diagnosis not present

## 2018-03-20 DIAGNOSIS — R2689 Other abnormalities of gait and mobility: Secondary | ICD-10-CM

## 2018-03-20 DIAGNOSIS — R2681 Unsteadiness on feet: Secondary | ICD-10-CM | POA: Diagnosis not present

## 2018-03-20 DIAGNOSIS — M25662 Stiffness of left knee, not elsewhere classified: Secondary | ICD-10-CM | POA: Diagnosis not present

## 2018-03-21 NOTE — Therapy (Signed)
[facility] [address] [place], [place], [address] Phone: [phone]   Fax:  [phone]  Physical Therapy Treatment  Patient Details  Name: Julia West MRN: [id number] Date of Birth: 12/10/1955 Referring Provider (PT): Meridee Score, MD   Encounter Date: 03/20/2018  PT End of Session - 03/20/18 1208    Visit Number  11    Number of Visits  26    Date for PT Re-Evaluation  05/12/18    Authorization Type  Medicare & Medicaid     PT Start Time  2876    PT Stop Time  1100    PT Time Calculation (min)  45 min    Equipment Utilized During Treatment  Gait belt    Activity Tolerance  Patient tolerated treatment well;Patient limited by fatigue    Behavior During Therapy  [facility] for tasks assessed/performed       Past Medical History:  Diagnosis Date  . Acute myocardial infarction    with a ruptured plaque in the circumflex in 2003  . Anxiety   . 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 Complication of anesthesia    states low O2 sats post-op 11/13, always slow to awaken  . Degenerative joint disease   . 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 Dyspnea    with activity- car to house - steps  . HTN (hypertension)   . Hyperlipidemia LDL goal <70   . Internal and external hemorrhoids without complication   . Lymphedema    right side of body  . Lymphedema of arm    right  . Morbid obesity ([facility])   . Surgical wound, non healing ABDOMINAL    has wound vac @ 125 mm Hg    Past Surgical History:  Procedure Laterality Date  . AMPUTATION Left 05/06/2017   Procedure: BELOW KNEE AMPUTATION;  Surgeon: Newt Minion, MD;  Location: [facility];  Service: Orthopedics;  Laterality: Left;  . APPLICATION OF WOUND VAC    . BOWEL RESECTION  02/07/2012   Procedure: SMALL BOWEL RESECTION;  Surgeon: Gayland Curry, MD,FACS;  Location: [facility];  Service: General;;  . CARDIOVASCULAR STRESS TEST  01-17-2012  DR HOCHREIN   LOW RISK  NUCLEAR TEST  . CESAREAN SECTION     x 4 in remote past  . CHOLECYSTECTOMY    . CORONARY ARTERY BYPASS GRAFT  2003   by Dr. Servando Snare. LIMA to the LAD, free RIMA to the circumflex. Stress perfusion study December 2009 with no high-risk areas of ischemia. She has a well-preserved ejection fraction  . HYSTEROSCOPY W/D&C N/A 05/14/2014   Procedure: DILATATION AND CURETTAGE (no specimen); HYSTEROSCOPY;  Surgeon: Jonnie Kind, MD;  Location: [facility];  Service: Gynecology;  Laterality: N/A;  . I&D EXTREMITY Left 06/21/2017   Procedure: IRRIGATION AND DEBRIDEMENT OF LEFT LEG AMPUTATION SITE;  Surgeon: Wallace Going, DO;  Location: [facility];  Service: Plastics;  Laterality: Left;  . INCISION AND DRAINAGE OF WOUND  04/17/2012   Procedure: IRRIGATION AND DEBRIDEMENT WOUND;  Surgeon: Theodoro Kos, DO;  Location: [facility];  Service: Plastics;  Laterality: N/A;  OF ABDOMINAL WOUND, SURGICAL PREP AND PLACEMENT OF VAC, REMOVAL FOREHEAD SKIN LESION  . INCISION AND DRAINAGE OF WOUND N/A 04/24/2012   Procedure: IRRIGATION AND DEBRIDEMENT WOUND;  Surgeon: Theodoro Kos, DO;  Location: [facility];  Service: Plastics;  Laterality: N/A;  I & D ABDOMINAL  WOUND WITH VAC AND ACELL  . INCISION AND DRAINAGE OF WOUND N/A 05/01/2012   Procedure: IRRIGATION AND DEBRIDEMENT WOUND;  Surgeon: Theodoro Kos, DO;  Location: [facility];  Service: Plastics;  Laterality: N/A;  WITH SURGICAL PREP AND PLACEMENT OF VAC  . INCISION AND DRAINAGE OF WOUND N/A 05/08/2012   Procedure: IRRIGATION AND DEBRIDEMENT OF ABD WOUND SURGICAL PREP AND PLACEMENT OF VAC ;  Surgeon: Theodoro Kos, DO;  Location: [facility];  Service: Plastics;  Laterality: N/A;  IRRIGATION AND DEBRIDEMENT OF ABD WOUND SURGICAL PREP AND PLACEMENT OF VAC   . INCISION AND DRAINAGE OF WOUND N/A 05/15/2012   Procedure: IRRIGATION AND DEBRIDEMENT OF ABDOMINAL WOUND WITH POSSIBLE SURGICAL PREP AND PLACEMENT OF VAC;   Surgeon: Theodoro Kos, DO;  Location: [facility];  Service: Plastics;  Laterality: N/A;  . INCISION AND DRAINAGE OF WOUND N/A 05/22/2012   Procedure: IRRIGATION AND DEBRIDEMENT OF ABODOMINAL WOUND WITH  SURGICAL PREP AND VAC PLACEMENT;  Surgeon: Theodoro Kos, DO;  Location: [facility];  Service: Plastics;  Laterality: N/A;  . INCISION AND DRAINAGE OF WOUND N/A 06/28/2012   Procedure: IRRIGATION AND DEBRIDEMENT OF ABDOMINAL ULCER SURGICAL PREP AND PLACEMENT OF ACELL AND VAC;  Surgeon: Theodoro Kos, DO;  Location: [facility];  Service: Plastics;  Laterality: N/A;  . INCISIONAL HERNIA REPAIR  02/07/2012   Procedure: HERNIA REPAIR INCISIONAL;  Surgeon: Gayland Curry, MD,FACS;  Location: [facility];  Service: General;;  Open, Primary repair, strangulated Incisional hernia.  Marland Kitchen LESION REMOVAL  04/17/2012   Procedure: LESION REMOVAL;  Surgeon: Theodoro Kos, DO;  Location: [facility];  Service: Plastics;  Laterality: N/A;   CENTER OF FOREHEAD, REMOVAL FORHEAD SKIN LESION  . ORIF TOE FRACTURE Left 02/09/2017   Procedure: EXCISION TALAR HEAD, INTERNAL FIXATION MEDIAL COLUMN LEFT FOOT;  Surgeon: Newt Minion, MD;  Location: [facility];  Service: Orthopedics;  Laterality: Left;  . POLYPECTOMY N/A 05/14/2014   Procedure: ENDOMETRIAL POLYPECTOMY;  Surgeon: Jonnie Kind, MD;  Location: [facility];  Service: Gynecology;  Laterality: N/A;  . STUMP REVISION Left 08/31/2017   Procedure: REVISION LEFT BELOW KNEE AMPUTATION;  Surgeon: Newt Minion, MD;  Location: [facility];  Service: Orthopedics;  Laterality: Left;    There were no vitals filed for this visit.  Subjective Assessment - 03/20/18 1015    Subjective  She is wearing prosthesis 6hrs 2x/day drying half way. She walked a lot in house Friday but was sore Saturday.   (Pended)     Patient is accompained by:  Family member  (Pended)     Pertinent History  L TTA, bradycardia, depression, lymphedema, DM2, HLD,  HTN, CAD, MI, CABG, CA basal cell face, bowel resection,   (Pended)     Limitations  Lifting;Standing;Walking;House hold activities  (Pended)     Patient Stated Goals  To use prosthesis to get in/out cars, bathrooms, access community & home  (Pended)     Currently in Pain?  No/denies  (Pended)                        [facility] Adult PT Treatment/Exercise - 03/20/18 1015      Transfers   Transfers  Sit to Stand;Stand to Sit    Sit to Stand  5: Supervision;With upper extremity assist;From chair/3-in-1   to RW from chairs without armrests pushing with UEs   Sit to Stand Details  Verbal cues  for technique    Stand to Sit  5: Supervision;With upper extremity assist;To chair/3-in-1   from RW to chair without armrests with UE assist   Stand to Sit Details (indicate cue type and reason)  Verbal cues for technique      Ambulation/Gait   Ambulation/Gait  Yes    Ambulation/Gait Assistance  5: Supervision;4: Min assist   MinA with rollator & supervision RW   Ambulation/Gait Assistance Details  PT trialed rollator as patient owns one & would enable seat to rest. Pt was so limited by instability of rollator walker that not recommended at this time. Pt agrees.  Verbal cues on upright posture.     Ambulation Distance (Feet)  150 Feet   150' X 2 RW & 77' rollator   Assistive device  Rolling walker;Prosthesis    Gait Pattern  Step-through pattern;Decreased step length - right;Decreased stance time - left;Right hip hike;Left flexed knee in stance;Right flexed knee in stance;Antalgic;Lateral hip instability    Ambulation Surface  Indoor;Level    Ramp  4: Min assist   RW & prosthesis   Ramp Details (indicate cue type and reason)  verbal & tactile cues on upright posture & wt shift    Curb  4: Min assist   RW & prosthesis   Curb Details (indicate cue type and reason)  verbal & tactile cues on sequence & wt shift      High Level Balance   High Level Balance Activities  --    High Level  Balance Comments  --      Neuro Re-ed    Neuro Re-ed Details   standing with chair posterior & RW anterior without UE support /intermittent touch:  static stance, head turns right/left & up/down, eyes closed 10sec static, looking behind shoulders with upper body rotation.       Prosthetics   Prosthetic Care Comments   Initiate prosthetic wear upon arising. wear all awake hours except 1-2 hrs midday    Current prosthetic wear tolerance (days/week)   daily    Current prosthetic wear tolerance (#hours/day)   6hrs 2x/day    Residual limb condition   No open areas, no redness, No signs or palpatation of sutures or ingrown hair, Pt reports tenderness has already cleared    Education Provided  Correct ply sock adjustment;Proper wear schedule/adjustment;Skin check;Residual limb care    Person(s) Educated  Patient    Education Method  Explanation;Verbal cues;Demonstration    Education Method  Verbalized understanding;Verbal cues required;Needs further instruction             PT Education - 03/20/18 1045    Education Details  Increase activity level slower. Keep tally for number of short walks /day. Start with 4 short walks/day and increase by 1 on Sundays & Wednesdays    Person(s) Educated  Patient    Methods  Explanation;Verbal cues    Comprehension  Verbalized understanding;Verbal cues required;Need further instruction       PT Short Term Goals - 03/14/18 1200      PT SHORT TERM GOAL #1   Title  Patient verbalizes adjusting ply socks to adjust limb volume changes. (All updated STGs Target Date: 04/14/2018)    Time  1    Period  Months    Status  New    Target Date  04/14/18      PT SHORT TERM GOAL #2   Title  Patient tolerates prosthesis wear >90% of awake hours with PT guidance for skin &  limb pain issues.     Time  1    Period  Months    Status  New    Target Date  04/14/18      PT SHORT TERM GOAL #3   Title  Patient able to stand 2 minutes without UE support with  supervision.     Time  1    Period  Months    Status  New    Target Date  04/14/18      PT SHORT TERM GOAL #4   Title  Patient ambulates 200' with RW & prosthesis with supervision.     Time  1    Period  Months    Status  Revised    Target Date  04/14/18      PT SHORT TERM GOAL #5   Title  Patient negotiates ramps & curbs with RW & prosthesis with supervision.     Time  1    Period  Months    Status  Revised    Target Date  04/14/18        PT Long Term Goals - 02/23/18 1135      PT LONG TERM GOAL #1   Title  Patient demonstrates and verbalizes proper prosthetic care to enable safe use of prosthesis (All LTGs Target Date: 05/12/2018)    Time  3    Period  Months    Status  On-going    Target Date  05/12/18      PT LONG TERM GOAL #2   Title  Patient tolerates wear of prosthesis >90% of awake hours without skin or limb pain issues to enable function throughout her day.     Time  3    Period  Months    Status  On-going    Target Date  05/12/18      PT LONG TERM GOAL #3   Title  Lillie Fragmin >36/56    Time  3    Period  Months    Status  On-going    Target Date  05/12/18      PT LONG TERM GOAL #4   Title  Patient ambulates 300' with LRAD & prosthesis modified independent for basic community mobility.     Time  3    Period  Months    Status  On-going    Target Date  05/12/18      PT LONG TERM GOAL #5   Title  Patient negotiates ramps, curbs & stairs with LRAD & prosthesis modified independent for community access.     Time  3    Period  Months    Status  On-going    Target Date  05/12/18            Plan - 03/20/18 1800    Clinical Impression Statement  Rollator walker appears to unstable at this time and would decrease activity level so PT does not recommend use at this time. Patient agress. Patient improved understanding how to negotiate ramps & curbs with RW and stand from chairs without armrests. PT also worked on standing balance without UE support  with chair & RW near for safety.     Rehab Potential  Good    PT Frequency  2x / week    PT Duration  Other (comment)   13 weeks (90 days)   PT Treatment/Interventions  ADLs/Self Care Home Management;DME Instruction;Gait training;Stair training;Functional mobility training;Therapeutic activities;Therapeutic exercise;Balance training;Neuromuscular re-education;Patient/family education;Prosthetic Training;Manual techniques;Vestibular    PT Next  Visit Plan  work towards updated STGs, review prosthetic care. standing balance activities, gait with RW.  sit to/from stand from standard ht chairs without armrests, increase activity tolerance during gait with RW.     Consulted and Agree with Plan of Care  Patient;Family member/caregiver    Family Member Consulted  husband who is deaf       Patient will benefit from skilled therapeutic intervention in order to improve the following deficits and impairments:  Abnormal gait, Decreased activity tolerance, Decreased balance, Cardiopulmonary status limiting activity, Decreased coordination, Decreased endurance, Decreased knowledge of use of DME, Decreased mobility, Decreased range of motion, Decreased skin integrity, Decreased strength, Dizziness, Impaired flexibility, Increased edema, Obesity, Prosthetic Dependency, Postural dysfunction  Visit Diagnosis: Other abnormalities of gait and mobility  Unsteadiness on feet  Abnormal posture  Muscle weakness (generalized)  Stiffness of left knee, not elsewhere classified  Stiffness of right knee, not elsewhere classified     Problem List Patient Active Problem List   Diagnosis Date Noted  . Dyslipidemia 11/23/2017  . Below knee amputation status, left 08/31/2017  . Dehiscence of amputation stump ([facility]) 08/29/2017  . Infection of amputation stump, left lower extremity ([facility]) 06/19/2017  . Bradycardia 05/07/2017  . Depression 05/07/2017  . Lymphedema 05/07/2017  . Charcot foot due to diabetes mellitus  ([facility]) 02/09/2017  . 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 Post-menopausal bleeding 04/15/2014  . Endometrial polyp 04/09/2014  . Ventral hernia 03/06/2012  . Open abdominal wall wound 03/06/2012  . Hyperlipemia 11/12/2008  . OBESITY, MORBID 11/12/2008  . Essential hypertension 11/12/2008  . CAD (coronary artery disease) 11/12/2008    Jamey Reas PT, DPT 03/21/2018, 7:23 AM  [facility] [address] [place], [place], [address] Phone: [phone]   Fax:  [phone]  Name: Julia West MRN: [id number] Date of Birth: Aug 17, 1955

## 2018-03-22 ENCOUNTER — Ambulatory Visit: Payer: Medicare Other | Admitting: Physical Therapy

## 2018-03-22 ENCOUNTER — Encounter: Payer: Self-pay | Admitting: Physical Therapy

## 2018-03-22 DIAGNOSIS — R2681 Unsteadiness on feet: Secondary | ICD-10-CM

## 2018-03-22 DIAGNOSIS — M6281 Muscle weakness (generalized): Secondary | ICD-10-CM

## 2018-03-22 DIAGNOSIS — M25662 Stiffness of left knee, not elsewhere classified: Secondary | ICD-10-CM | POA: Diagnosis not present

## 2018-03-22 DIAGNOSIS — R2689 Other abnormalities of gait and mobility: Secondary | ICD-10-CM

## 2018-03-22 DIAGNOSIS — R293 Abnormal posture: Secondary | ICD-10-CM | POA: Diagnosis not present

## 2018-03-22 DIAGNOSIS — M25661 Stiffness of right knee, not elsewhere classified: Secondary | ICD-10-CM | POA: Diagnosis not present

## 2018-03-22 NOTE — Therapy (Signed)
[facility] [address] [place], [place], [address] Phone: [phone]   Fax:  [phone]  Physical Therapy Treatment  Patient Details  Name: Julia West MRN: [id number] Date of Birth: 1955/08/28 Referring Provider (PT): Meridee Score, MD   Encounter Date: 03/22/2018  PT End of Session - 03/22/18 1022    Visit Number  12    Number of Visits  26    Date for PT Re-Evaluation  05/12/18    Authorization Type  Medicare & Medicaid     PT Start Time  0930    PT Stop Time  4536    PT Time Calculation (min)  45 min    Equipment Utilized During Treatment  Gait belt    Activity Tolerance  Patient tolerated treatment well;Patient limited by fatigue    Behavior During Therapy  [facility] for tasks assessed/performed       Past Medical History:  Diagnosis Date  . Acute myocardial infarction    with a ruptured plaque in the circumflex in 2003  . Anxiety   . Cancer ([place])    basal cell face  . Complication of anesthesia    states low O2 sats post-op 11/13, always slow to awaken  . Degenerative joint disease   . 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 Dyspnea    with activity- car to house - steps  . HTN (hypertension)   . Hyperlipidemia LDL goal <70   . Internal and external hemorrhoids without complication   . Lymphedema    right side of body  . Lymphedema of arm    right  . Morbid obesity ([place])   . Surgical wound, non healing ABDOMINAL    has wound vac @ 125 mm Hg    Past Surgical History:  Procedure Laterality Date  . AMPUTATION Left 05/06/2017   Procedure: BELOW KNEE AMPUTATION;  Surgeon: Newt Minion, MD;  Location: [facility];  Service: Orthopedics;  Laterality: Left;  . APPLICATION OF WOUND VAC    . BOWEL RESECTION  02/07/2012   Procedure: SMALL BOWEL RESECTION;  Surgeon: Gayland Curry, MD,FACS;  Location: [facility];  Service: General;;  . CARDIOVASCULAR STRESS TEST  01-17-2012  DR HOCHREIN   LOW RISK  NUCLEAR TEST  . CESAREAN SECTION     x 4 in remote past  . CHOLECYSTECTOMY    . CORONARY ARTERY BYPASS GRAFT  2003   by Dr. Servando Snare. LIMA to the LAD, free RIMA to the circumflex. Stress perfusion study December 2009 with no high-risk areas of ischemia. She has a well-preserved ejection fraction  . HYSTEROSCOPY W/D&C N/A 05/14/2014   Procedure: DILATATION AND CURETTAGE (no specimen); HYSTEROSCOPY;  Surgeon: Jonnie Kind, MD;  Location: [facility];  Service: Gynecology;  Laterality: N/A;  . I&D EXTREMITY Left 06/21/2017   Procedure: IRRIGATION AND DEBRIDEMENT OF LEFT LEG AMPUTATION SITE;  Surgeon: Wallace Going, DO;  Location: [facility];  Service: Plastics;  Laterality: Left;  . INCISION AND DRAINAGE OF WOUND  04/17/2012   Procedure: IRRIGATION AND DEBRIDEMENT WOUND;  Surgeon: Theodoro Kos, DO;  Location: [facility];  Service: Plastics;  Laterality: N/A;  OF ABDOMINAL WOUND, SURGICAL PREP AND PLACEMENT OF VAC, REMOVAL FOREHEAD SKIN LESION  . INCISION AND DRAINAGE OF WOUND N/A 04/24/2012   Procedure: IRRIGATION AND DEBRIDEMENT WOUND;  Surgeon: Theodoro Kos, DO;  Location: [facility];  Service: Plastics;  Laterality: N/A;  I & D ABDOMINAL  WOUND WITH VAC AND ACELL  . INCISION AND DRAINAGE OF WOUND N/A 05/01/2012   Procedure: IRRIGATION AND DEBRIDEMENT WOUND;  Surgeon: Theodoro Kos, DO;  Location: [facility];  Service: Plastics;  Laterality: N/A;  WITH SURGICAL PREP AND PLACEMENT OF VAC  . INCISION AND DRAINAGE OF WOUND N/A 05/08/2012   Procedure: IRRIGATION AND DEBRIDEMENT OF ABD WOUND SURGICAL PREP AND PLACEMENT OF VAC ;  Surgeon: Theodoro Kos, DO;  Location: [facility];  Service: Plastics;  Laterality: N/A;  IRRIGATION AND DEBRIDEMENT OF ABD WOUND SURGICAL PREP AND PLACEMENT OF VAC   . INCISION AND DRAINAGE OF WOUND N/A 05/15/2012   Procedure: IRRIGATION AND DEBRIDEMENT OF ABDOMINAL WOUND WITH POSSIBLE SURGICAL PREP AND PLACEMENT OF VAC;   Surgeon: Theodoro Kos, DO;  Location: [facility];  Service: Plastics;  Laterality: N/A;  . INCISION AND DRAINAGE OF WOUND N/A 05/22/2012   Procedure: IRRIGATION AND DEBRIDEMENT OF ABODOMINAL WOUND WITH  SURGICAL PREP AND VAC PLACEMENT;  Surgeon: Theodoro Kos, DO;  Location: [facility];  Service: Plastics;  Laterality: N/A;  . INCISION AND DRAINAGE OF WOUND N/A 06/28/2012   Procedure: IRRIGATION AND DEBRIDEMENT OF ABDOMINAL ULCER SURGICAL PREP AND PLACEMENT OF ACELL AND VAC;  Surgeon: Theodoro Kos, DO;  Location: [facility];  Service: Plastics;  Laterality: N/A;  . INCISIONAL HERNIA REPAIR  02/07/2012   Procedure: HERNIA REPAIR INCISIONAL;  Surgeon: Gayland Curry, MD,FACS;  Location: [facility];  Service: General;;  Open, Primary repair, strangulated Incisional hernia.  Marland Kitchen LESION REMOVAL  04/17/2012   Procedure: LESION REMOVAL;  Surgeon: Theodoro Kos, DO;  Location: [facility];  Service: Plastics;  Laterality: N/A;   CENTER OF FOREHEAD, REMOVAL FORHEAD SKIN LESION  . ORIF TOE FRACTURE Left 02/09/2017   Procedure: EXCISION TALAR HEAD, INTERNAL FIXATION MEDIAL COLUMN LEFT FOOT;  Surgeon: Newt Minion, MD;  Location: [facility];  Service: Orthopedics;  Laterality: Left;  . POLYPECTOMY N/A 05/14/2014   Procedure: ENDOMETRIAL POLYPECTOMY;  Surgeon: Jonnie Kind, MD;  Location: [facility];  Service: Gynecology;  Laterality: N/A;  . STUMP REVISION Left 08/31/2017   Procedure: REVISION LEFT BELOW KNEE AMPUTATION;  Surgeon: Newt Minion, MD;  Location: [facility];  Service: Orthopedics;  Laterality: Left;    There were no vitals filed for this visit.  Subjective Assessment - 03/22/18 0930    Subjective  She is wearing prosthesis 6hrs 2x/day and drying midpoint. She is walking 4 short walks in home per day and uping to 5 today. She has some heat rash on limb.     Patient is accompained by:  Family member    Pertinent History  L TTA, bradycardia, depression,  lymphedema, DM2, HLD, HTN, CAD, MI, CABG, CA basal cell face, bowel resection,     Limitations  Lifting;Standing;Walking;House hold activities    Patient Stated Goals  To use prosthesis to get in/out cars, bathrooms, access community & home    Currently in Pain?  No/denies                       [facility] Adult PT Treatment/Exercise - 03/22/18 0930      Transfers   Transfers  Sit to Stand;Stand to Sit    Sit to Stand  5: Supervision;With upper extremity assist;From chair/3-in-1   to RW from chairs without armrests pushing with UEs   Sit to Stand Details  Verbal cues for technique    Stand to  Sit  5: Supervision;With upper extremity assist;To chair/3-in-1   from RW to chair without armrests with UE assist   Stand to Sit Details (indicate cue type and reason)  Verbal cues for technique      Ambulation/Gait   Ambulation/Gait  Yes    Ambulation/Gait Assistance  5: Supervision;4: Min guard    Ambulation/Gait Assistance Details  Pt reports her RW is standard width not bariatric. When ambulating with narrower stance to fit in RW width, her prosthesis has lateral pylon lean and internal rotation.  PT set-up appointment with prosthetist next Tuesday prior to PT appt for alignment check to ambulate with std width RW     Ambulation Distance (Feet)  175 Feet    Assistive device  Rolling walker;Prosthesis    Gait Pattern  Step-through pattern;Decreased step length - right;Decreased stance time - left;Right hip hike;Left flexed knee in stance;Right flexed knee in stance;Antalgic;Lateral hip instability    Ambulation Surface  Indoor;Level    Stairs  Yes    Stairs Assistance  5: Supervision    Stairs Assistance Details (indicate cue type and reason)  verbal cues & demo on technique with single rail & cane and prosthesis    Stair Management Technique  One rail Right;One rail Left;With cane;Step to pattern;Forwards   2 steps right rail & 2 steps left rail   Number of Stairs  4    Ramp  4: Min  assist   RW & prosthesis   Ramp Details (indicate cue type and reason)  verbal & tactile cues on technique with posture & wt shift    Curb  4: Min assist   RW & prosthesis   Curb Details (indicate cue type and reason)  verbal & tactile cues on technique with posture & wt shift      Therapeutic Activites    Therapeutic Activities  Lifting    Lifting  PT demo, instructed in technique to pick up items from floor with Transtibial prosthesis.  Pt return demo understanding with RW support with supervision.       Prosthetics   Prosthetic Care Comments   Positioning prosthetic foot in sitting with LE abducted with hip/knee/foot in line to decrease external rotation with medial prosthetic wall pressure.     Current prosthetic wear tolerance (days/week)   daily    Current prosthetic wear tolerance (#hours/day)   6hrs 2x/day with 1 hr off mid-day and drying limb/liner half way.    Residual limb condition   Redness at proximal medial knee. Appears from proximal socket pressure from sitting with external rotation.     Education Provided  Correct ply sock adjustment;Proper wear schedule/adjustment;Skin check;Residual limb care;Other (comment)   see prosthetic care comments   Person(s) Educated  Patient    Education Method  Explanation;Demonstration;Verbal cues;Tactile cues    Education Method  Verbalized understanding;Returned demonstration;Tactile cues required;Verbal cues required;Needs further instruction    Donning Prosthesis  Supervision    Doffing Prosthesis  Modified independent (device/increased time)               PT Short Term Goals - 03/14/18 1200      PT SHORT TERM GOAL #1   Title  Patient verbalizes adjusting ply socks to adjust limb volume changes. (All updated STGs Target Date: 04/14/2018)    Time  1    Period  Months    Status  New    Target Date  04/14/18      PT SHORT TERM GOAL #2   Title  Patient tolerates prosthesis wear >90% of awake hours with PT guidance for skin &  limb pain issues.     Time  1    Period  Months    Status  New    Target Date  04/14/18      PT SHORT TERM GOAL #3   Title  Patient able to stand 2 minutes without UE support with supervision.     Time  1    Period  Months    Status  New    Target Date  04/14/18      PT SHORT TERM GOAL #4   Title  Patient ambulates 200' with RW & prosthesis with supervision.     Time  1    Period  Months    Status  Revised    Target Date  04/14/18      PT SHORT TERM GOAL #5   Title  Patient negotiates ramps & curbs with RW & prosthesis with supervision.     Time  1    Period  Months    Status  Revised    Target Date  04/14/18        PT Long Term Goals - 02/23/18 1135      PT LONG TERM GOAL #1   Title  Patient demonstrates and verbalizes proper prosthetic care to enable safe use of prosthesis (All LTGs Target Date: 05/12/2018)    Time  3    Period  Months    Status  On-going    Target Date  05/12/18      PT LONG TERM GOAL #2   Title  Patient tolerates wear of prosthesis >90% of awake hours without skin or limb pain issues to enable function throughout her day.     Time  3    Period  Months    Status  On-going    Target Date  05/12/18      PT LONG TERM GOAL #3   Title  Lillie Fragmin >36/56    Time  3    Period  Months    Status  On-going    Target Date  05/12/18      PT LONG TERM GOAL #4   Title  Patient ambulates 300' with LRAD & prosthesis modified independent for basic community mobility.     Time  3    Period  Months    Status  On-going    Target Date  05/12/18      PT LONG TERM GOAL #5   Title  Patient negotiates ramps, curbs & stairs with LRAD & prosthesis modified independent for community access.     Time  3    Period  Months    Status  On-going    Target Date  05/12/18            Plan - 03/22/18 2233    Clinical Impression Statement  Patient appears to need an alignment check from prosthetist when ambulating with standard width RW like she has. Narrower  stance appears to be causing prosthetic alignment issues with weight bearing. Patient was instructed in negotiating stairs with single rail with cane and picking up items from floor with RW support.     Rehab Potential  Good    PT Frequency  2x / week    PT Duration  Other (comment)   13 weeks (90 days)   PT Treatment/Interventions  ADLs/Self Care Home Management;DME Instruction;Gait training;Stair training;Functional mobility training;Therapeutic activities;Therapeutic exercise;Balance training;Neuromuscular re-education;Patient/family education;Prosthetic  Training;Manual techniques;Vestibular    PT Next Visit Plan  work towards updated STGs, review prosthetic care. standing balance activities, gait with RW.  sit to/from stand from standard ht chairs without armrests, increase activity tolerance during gait with RW.     Consulted and Agree with Plan of Care  Patient;Family member/caregiver    Family Member Consulted  husband who is deaf       Patient will benefit from skilled therapeutic intervention in order to improve the following deficits and impairments:  Abnormal gait, Decreased activity tolerance, Decreased balance, Cardiopulmonary status limiting activity, Decreased coordination, Decreased endurance, Decreased knowledge of use of DME, Decreased mobility, Decreased range of motion, Decreased skin integrity, Decreased strength, Dizziness, Impaired flexibility, Increased edema, Obesity, Prosthetic Dependency, Postural dysfunction  Visit Diagnosis: Other abnormalities of gait and mobility  Unsteadiness on feet  Abnormal posture  Muscle weakness (generalized)     Problem List Patient Active Problem List   Diagnosis Date Noted  . Dyslipidemia 11/23/2017  . Below knee amputation status, left 08/31/2017  . Dehiscence of amputation stump ([place]) 08/29/2017  . Infection of amputation stump, left lower extremity ([place]) 06/19/2017  . Bradycardia 05/07/2017  . Depression 05/07/2017  .  Lymphedema 05/07/2017  . Charcot foot due to diabetes mellitus ([place]) 02/09/2017  . 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 Post-menopausal bleeding 04/15/2014  . Endometrial polyp 04/09/2014  . Ventral hernia 03/06/2012  . Open abdominal wall wound 03/06/2012  . Hyperlipemia 11/12/2008  . OBESITY, MORBID 11/12/2008  . Essential hypertension 11/12/2008  . CAD (coronary artery disease) 11/12/2008    Jamey Reas PT, DPT 03/22/2018, 10:36 PM  [facility] [address] [place], [place], [address] Phone: [phone]   Fax:  [phone]  Name: Julia West MRN: [id number] Date of Birth: 1955/06/01

## 2018-03-27 ENCOUNTER — Encounter: Payer: Medicare Other | Admitting: Physical Therapy

## 2018-03-27 ENCOUNTER — Other Ambulatory Visit: Payer: Self-pay | Admitting: Family Medicine

## 2018-03-27 MED ORDER — HYDROCHLOROTHIAZIDE 12.5 MG PO CAPS: ORAL_CAPSULE | ORAL | 0 refills | Status: DC

## 2018-03-27 MED ORDER — LOSARTAN POTASSIUM 100 MG PO TABS: 100.0000 mg | ORAL_TABLET | Freq: Every day | ORAL | 0 refills | Status: DC

## 2018-03-27 MED ORDER — VALSARTAN-HYDROCHLOROTHIAZIDE 160-12.5 MG PO TABS: 1.0000 | ORAL_TABLET | Freq: Every day | ORAL | 0 refills | Status: DC

## 2018-03-28 ENCOUNTER — Encounter: Payer: Self-pay | Admitting: Physical Therapy

## 2018-03-28 ENCOUNTER — Ambulatory Visit: Payer: Medicare Other | Admitting: Physical Therapy

## 2018-03-28 DIAGNOSIS — M6281 Muscle weakness (generalized): Secondary | ICD-10-CM | POA: Diagnosis not present

## 2018-03-28 DIAGNOSIS — M25662 Stiffness of left knee, not elsewhere classified: Secondary | ICD-10-CM | POA: Diagnosis not present

## 2018-03-28 DIAGNOSIS — M25661 Stiffness of right knee, not elsewhere classified: Secondary | ICD-10-CM | POA: Diagnosis not present

## 2018-03-28 DIAGNOSIS — R2681 Unsteadiness on feet: Secondary | ICD-10-CM | POA: Diagnosis not present

## 2018-03-28 DIAGNOSIS — R2689 Other abnormalities of gait and mobility: Secondary | ICD-10-CM

## 2018-03-28 DIAGNOSIS — R293 Abnormal posture: Secondary | ICD-10-CM

## 2018-03-28 NOTE — Therapy (Signed)
[facility] [address] [place], [place], [address] Phone: [phone]   Fax:  [phone]  Physical Therapy Treatment  Patient Details  Name: Julia West MRN: [id number] Date of Birth: [birth year] Referring Provider (PT): Meridee Score, MD   Encounter Date: 03/28/2018  PT End of Session - 03/28/18 1323    Visit Number  13    Number of Visits  26    Date for PT Re-Evaluation  05/12/18    Authorization Type  Medicare & Medicaid     PT Start Time  1319    PT Stop Time  1401    PT Time Calculation (min)  42 min    Equipment Utilized During Treatment  Gait belt    Activity Tolerance  Patient tolerated treatment well;Patient limited by fatigue    Behavior During Therapy  WFL for tasks assessed/performed       Past Medical History:  Diagnosis Date  . Acute myocardial infarction    with a ruptured plaque in the circumflex in 2003  . Anxiety   . Cancer ([place])    basal cell face  . Complication of anesthesia    states low O2 sats post-op 11/13, always slow to awaken  . Degenerative joint disease   . 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 Dyspnea    with activity- car to house - steps  . HTN (hypertension)   . Hyperlipidemia LDL goal <70   . Internal and external hemorrhoids without complication   . Lymphedema    right side of body  . Lymphedema of arm    right  . Morbid obesity ([place])   . Surgical wound, non healing ABDOMINAL    has wound vac @ 125 mm Hg    Past Surgical History:  Procedure Laterality Date  . AMPUTATION Left 05/06/2017   Procedure: BELOW KNEE AMPUTATION;  Surgeon: Newt Minion, MD;  Location: [facility];  Service: Orthopedics;  Laterality: Left;  . APPLICATION OF WOUND VAC    . BOWEL RESECTION  02/07/2012   Procedure: SMALL BOWEL RESECTION;  Surgeon: Gayland Curry, MD,FACS;  Location: [facility];  Service: General;;  . CARDIOVASCULAR STRESS TEST  01-17-2012  DR HOCHREIN   LOW RISK  NUCLEAR TEST  . CESAREAN SECTION     x 4 in remote past  . CHOLECYSTECTOMY    . CORONARY ARTERY BYPASS GRAFT  2003   by Dr. Servando Snare. LIMA to the LAD, free RIMA to the circumflex. Stress perfusion study December 2009 with no high-risk areas of ischemia. She has a well-preserved ejection fraction  . HYSTEROSCOPY W/D&C N/A 05/14/2014   Procedure: DILATATION AND CURETTAGE (no specimen); HYSTEROSCOPY;  Surgeon: Jonnie Kind, MD;  Location: [facility];  Service: Gynecology;  Laterality: N/A;  . I&D EXTREMITY Left 06/21/2017   Procedure: IRRIGATION AND DEBRIDEMENT OF LEFT LEG AMPUTATION SITE;  Surgeon: Wallace Going, DO;  Location: [facility];  Service: Plastics;  Laterality: Left;  . INCISION AND DRAINAGE OF WOUND  04/17/2012   Procedure: IRRIGATION AND DEBRIDEMENT WOUND;  Surgeon: Theodoro Kos, DO;  Location: [facility];  Service: Plastics;  Laterality: N/A;  OF ABDOMINAL WOUND, SURGICAL PREP AND PLACEMENT OF VAC, REMOVAL FOREHEAD SKIN LESION  . INCISION AND DRAINAGE OF WOUND N/A 04/24/2012   Procedure: IRRIGATION AND DEBRIDEMENT WOUND;  Surgeon: Theodoro Kos, DO;  Location: [facility];  Service: Plastics;  Laterality: N/A;  I & D ABDOMINAL  WOUND WITH VAC AND ACELL  . INCISION AND DRAINAGE OF WOUND N/A 05/01/2012   Procedure: IRRIGATION AND DEBRIDEMENT WOUND;  Surgeon: Theodoro Kos, DO;  Location: [facility];  Service: Plastics;  Laterality: N/A;  WITH SURGICAL PREP AND PLACEMENT OF VAC  . INCISION AND DRAINAGE OF WOUND N/A 05/08/2012   Procedure: IRRIGATION AND DEBRIDEMENT OF ABD WOUND SURGICAL PREP AND PLACEMENT OF VAC ;  Surgeon: Theodoro Kos, DO;  Location: [facility];  Service: Plastics;  Laterality: N/A;  IRRIGATION AND DEBRIDEMENT OF ABD WOUND SURGICAL PREP AND PLACEMENT OF VAC   . INCISION AND DRAINAGE OF WOUND N/A 05/15/2012   Procedure: IRRIGATION AND DEBRIDEMENT OF ABDOMINAL WOUND WITH POSSIBLE SURGICAL PREP AND PLACEMENT OF VAC;   Surgeon: Theodoro Kos, DO;  Location: [facility];  Service: Plastics;  Laterality: N/A;  . INCISION AND DRAINAGE OF WOUND N/A 05/22/2012   Procedure: IRRIGATION AND DEBRIDEMENT OF ABODOMINAL WOUND WITH  SURGICAL PREP AND VAC PLACEMENT;  Surgeon: Theodoro Kos, DO;  Location: [facility];  Service: Plastics;  Laterality: N/A;  . INCISION AND DRAINAGE OF WOUND N/A 06/28/2012   Procedure: IRRIGATION AND DEBRIDEMENT OF ABDOMINAL ULCER SURGICAL PREP AND PLACEMENT OF ACELL AND VAC;  Surgeon: Theodoro Kos, DO;  Location: [facility];  Service: Plastics;  Laterality: N/A;  . INCISIONAL HERNIA REPAIR  02/07/2012   Procedure: HERNIA REPAIR INCISIONAL;  Surgeon: Gayland Curry, MD,FACS;  Location: [facility];  Service: General;;  Open, Primary repair, strangulated Incisional hernia.  Marland Kitchen LESION REMOVAL  04/17/2012   Procedure: LESION REMOVAL;  Surgeon: Theodoro Kos, DO;  Location: [facility];  Service: Plastics;  Laterality: N/A;   CENTER OF FOREHEAD, REMOVAL FORHEAD SKIN LESION  . ORIF TOE FRACTURE Left 02/09/2017   Procedure: EXCISION TALAR HEAD, INTERNAL FIXATION MEDIAL COLUMN LEFT FOOT;  Surgeon: Newt Minion, MD;  Location: [facility];  Service: Orthopedics;  Laterality: Left;  . POLYPECTOMY N/A 05/14/2014   Procedure: ENDOMETRIAL POLYPECTOMY;  Surgeon: Jonnie Kind, MD;  Location: [facility];  Service: Gynecology;  Laterality: N/A;  . STUMP REVISION Left 08/31/2017   Procedure: REVISION LEFT BELOW KNEE AMPUTATION;  Surgeon: Newt Minion, MD;  Location: [facility];  Service: Orthopedics;  Laterality: Left;    There were no vitals filed for this visit.  Subjective Assessment - 03/28/18 1321    Subjective  Saw prosthetist who made changes. He didn't like them so he changed parts out on bottom of prosthesis. Feels better with walking now. No falls or pain today.    Patient is accompained by:  Family member   spouse   Pertinent History  L TTA, bradycardia,  depression, lymphedema, DM2, HLD, HTN, CAD, MI, CABG, CA basal cell face, bowel resection,     Limitations  Lifting;Standing;Walking;House hold activities    Patient Stated Goals  To use prosthesis to get in/out cars, bathrooms, access community & home    Currently in Pain?  No/denies            [facility] Adult PT Treatment/Exercise - 03/28/18 1324      Transfers   Transfers  Sit to Stand;Stand to Sit    Sit to Stand  5: Supervision;With upper extremity assist;From chair/3-in-1    Sit to Stand Details (indicate cue type and reason)  needs UE support for stability with standing    Stand to Sit  5: Supervision;With upper extremity assist;To chair/3-in-1    Stand to Sit Details  needs UE support for stability with sitting down      Ambulation/Gait   Ambulation/Gait  Yes    Ambulation/Gait Assistance  5: Supervision;4: Min guard    Ambulation/Gait Assistance Details  cues on posture, walker position and for equal step length with gait.     Ambulation Distance (Feet)  130 Feet   x1   Assistive device  Rolling walker;Prosthesis    Gait Pattern  Step-through pattern;Decreased step length - right;Decreased stance time - left;Right hip hike;Left flexed knee in stance;Right flexed knee in stance;Antalgic;Lateral hip instability    Ambulation Surface  Level;Indoor    Stairs  Yes    Stairs Assistance  5: Supervision    Stairs Assistance Details (indicate cue type and reason)  cues on weight shifting and sequencing.    Stair Management Technique  One rail Right;One rail Left;With cane;Step to pattern;Forwards    Number of Stairs  4    Ramp  4: Min assist    Ramp Details (indicate cue type and reason)  with RW/prosthesis: cues on posture and technique    Curb  Other (comment)   min guard assist   Curb Details (indicate cue type and reason)  with RW/prosthesis: cues for stance position to improve balance with advancing RW      Prosthetics   Current prosthetic wear tolerance (days/week)   daily     Current prosthetic wear tolerance (#hours/day)   6hrs 2x/day with 1 hr off mid-day and drying limb/liner half way.    Residual limb condition   skin is less red since adjustments to prosthesis, intact with no issues.     Education Provided  Residual limb care;Proper wear schedule/adjustment;Proper weight-bearing schedule/adjustment    Person(s) Educated  Patient    Education Method  Explanation;Demonstration;Verbal cues    Education Method  Verbalized understanding;Returned demonstration;Verbal cues required    Donning Prosthesis  Supervision    Doffing Prosthesis  Modified independent (device/increased time)               PT Short Term Goals - 03/14/18 1200      PT SHORT TERM GOAL #1   Title  Patient verbalizes adjusting ply socks to adjust limb volume changes. (All updated STGs Target Date: 04/14/2018)    Time  1    Period  Months    Status  New    Target Date  04/14/18      PT SHORT TERM GOAL #2   Title  Patient tolerates prosthesis wear >90% of awake hours with PT guidance for skin & limb pain issues.     Time  1    Period  Months    Status  New    Target Date  04/14/18      PT SHORT TERM GOAL #3   Title  Patient able to stand 2 minutes without UE support with supervision.     Time  1    Period  Months    Status  New    Target Date  04/14/18      PT SHORT TERM GOAL #4   Title  Patient ambulates 200' with RW & prosthesis with supervision.     Time  1    Period  Months    Status  Revised    Target Date  04/14/18      PT SHORT TERM GOAL #5   Title  Patient negotiates ramps & curbs with RW & prosthesis with supervision.     Time  1  Period  Months    Status  Revised    Target Date  04/14/18        PT Long Term Goals - 02/23/18 1135      PT LONG TERM GOAL #1   Title  Patient demonstrates and verbalizes proper prosthetic care to enable safe use of prosthesis (All LTGs Target Date: 05/12/2018)    Time  3    Period  Months    Status  On-going    Target  Date  05/12/18      PT LONG TERM GOAL #2   Title  Patient tolerates wear of prosthesis >90% of awake hours without skin or limb pain issues to enable function throughout her day.     Time  3    Period  Months    Status  On-going    Target Date  05/12/18      PT LONG TERM GOAL #3   Title  Lillie Fragmin >36/56    Time  3    Period  Months    Status  On-going    Target Date  05/12/18      PT LONG TERM GOAL #4   Title  Patient ambulates 300' with LRAD & prosthesis modified independent for basic community mobility.     Time  3    Period  Months    Status  On-going    Target Date  05/12/18      PT LONG TERM GOAL #5   Title  Patient negotiates ramps, curbs & stairs with LRAD & prosthesis modified independent for community access.     Time  3    Period  Months    Status  On-going    Target Date  05/12/18            Plan - 03/28/18 1323    Clinical Impression Statement  Today's skilled session continued to address gait/barriers with prosthesis with improved gait pattern/prosthesis alignment noted. The pt is progressing toward goals and should benefit from continued PT to progress toward unmet goals.     Rehab Potential  Good    PT Frequency  2x / week    PT Duration  Other (comment)   13 weeks (90 days)   PT Treatment/Interventions  ADLs/Self Care Home Management;DME Instruction;Gait training;Stair training;Functional mobility training;Therapeutic activities;Therapeutic exercise;Balance training;Neuromuscular re-education;Patient/family education;Prosthetic Training;Manual techniques;Vestibular    PT Next Visit Plan  pt wants to try a cane with gait- start with laps around a table; work toward updated STGs, begin to work on balance activities.     Consulted and Agree with Plan of Care  Patient;Family member/caregiver    Family Member Consulted  husband who is deaf       Patient will benefit from skilled therapeutic intervention in order to improve the following deficits and  impairments:  Abnormal gait, Decreased activity tolerance, Decreased balance, Cardiopulmonary status limiting activity, Decreased coordination, Decreased endurance, Decreased knowledge of use of DME, Decreased mobility, Decreased range of motion, Decreased skin integrity, Decreased strength, Dizziness, Impaired flexibility, Increased edema, Obesity, Prosthetic Dependency, Postural dysfunction  Visit Diagnosis: Other abnormalities of gait and mobility  Unsteadiness on feet  Abnormal posture  Muscle weakness (generalized)     Problem List Patient Active Problem List   Diagnosis Date Noted  . Dyslipidemia 11/23/2017  . Below knee amputation status, left 08/31/2017  . Dehiscence of amputation stump ([facility]) 08/29/2017  . Infection of amputation stump, left lower extremity ([place]) 06/19/2017  . Bradycardia 05/07/2017  .  Depression 05/07/2017  . Lymphedema 05/07/2017  . Charcot foot due to diabetes mellitus ([place]) 02/09/2017  . 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 Post-menopausal bleeding 04/15/2014  . Endometrial polyp 04/09/2014  . Ventral hernia 03/06/2012  . Open abdominal wall wound 03/06/2012  . Hyperlipemia 11/12/2008  . OBESITY, MORBID 11/12/2008  . Essential hypertension 11/12/2008  . CAD (coronary artery disease) 11/12/2008    Willow Ora, PTA, [facility] [address] [place], [name] [address] [phone] 03/28/18, 4:20 PM   Name: Julia West MRN: [id number] Date of Birth: Aug 09, 1955

## 2018-03-29 ENCOUNTER — Ambulatory Visit: Payer: Medicare Other | Admitting: Physical Therapy

## 2018-03-31 ENCOUNTER — Ambulatory Visit: Payer: Medicare Other

## 2018-03-31 ENCOUNTER — Other Ambulatory Visit: Payer: Self-pay | Admitting: *Deleted

## 2018-03-31 DIAGNOSIS — R2681 Unsteadiness on feet: Secondary | ICD-10-CM | POA: Diagnosis not present

## 2018-03-31 DIAGNOSIS — M25662 Stiffness of left knee, not elsewhere classified: Secondary | ICD-10-CM | POA: Diagnosis not present

## 2018-03-31 DIAGNOSIS — R2689 Other abnormalities of gait and mobility: Secondary | ICD-10-CM | POA: Diagnosis not present

## 2018-03-31 DIAGNOSIS — M6281 Muscle weakness (generalized): Secondary | ICD-10-CM

## 2018-03-31 DIAGNOSIS — R293 Abnormal posture: Secondary | ICD-10-CM | POA: Diagnosis not present

## 2018-03-31 DIAGNOSIS — M25661 Stiffness of right knee, not elsewhere classified: Secondary | ICD-10-CM | POA: Diagnosis not present

## 2018-03-31 NOTE — Therapy (Signed)
[facility] [address] [place], [place], [address] Phone: [phone]   Fax:  [phone]  Physical Therapy Treatment  Patient Details  Name: Julia West MRN: [id number] Date of Birth: [birth year] Referring Provider (PT): Meridee Score, MD   Encounter Date: 03/31/2018  PT End of Session - 03/31/18 0857    Visit Number  14    Number of Visits  26    Date for PT Re-Evaluation  05/12/18    Authorization Type  Medicare & Medicaid     PT Start Time  0845    PT Stop Time  0929    PT Time Calculation (min)  44 min    Equipment Utilized During Treatment  Gait belt    Activity Tolerance  Patient tolerated treatment well;Patient limited by fatigue    Behavior During Therapy  [facility] for tasks assessed/performed       Past Medical History:  Diagnosis Date  . Acute myocardial infarction    with a ruptured plaque in the circumflex in 2003  . Anxiety   . 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 Complication of anesthesia    states low O2 sats post-op 11/13, always slow to awaken  . Degenerative joint disease   . 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 Dyspnea    with activity- car to house - steps  . HTN (hypertension)   . Hyperlipidemia LDL goal <70   . Internal and external hemorrhoids without complication   . Lymphedema    right side of body  . Lymphedema of arm    right  . Morbid obesity ([place])   . Surgical wound, non healing ABDOMINAL    has wound vac @ 125 mm Hg    Past Surgical History:  Procedure Laterality Date  . AMPUTATION Left 05/06/2017   Procedure: BELOW KNEE AMPUTATION;  Surgeon: Newt Minion, MD;  Location: [facility];  Service: Orthopedics;  Laterality: Left;  . APPLICATION OF WOUND VAC    . BOWEL RESECTION  02/07/2012   Procedure: SMALL BOWEL RESECTION;  Surgeon: Gayland Curry, MD,FACS;  Location: [facility];  Service: General;;  . CARDIOVASCULAR STRESS TEST  01-17-2012  DR HOCHREIN   LOW RISK  NUCLEAR TEST  . CESAREAN SECTION     x 4 in remote past  . CHOLECYSTECTOMY    . CORONARY ARTERY BYPASS GRAFT  2003   by Dr. Servando Snare. LIMA to the LAD, free RIMA to the circumflex. Stress perfusion study December 2009 with no high-risk areas of ischemia. She has a well-preserved ejection fraction  . HYSTEROSCOPY W/D&C N/A 05/14/2014   Procedure: DILATATION AND CURETTAGE (no specimen); HYSTEROSCOPY;  Surgeon: Jonnie Kind, MD;  Location: [facility];  Service: Gynecology;  Laterality: N/A;  . I&D EXTREMITY Left 06/21/2017   Procedure: IRRIGATION AND DEBRIDEMENT OF LEFT LEG AMPUTATION SITE;  Surgeon: Wallace Going, DO;  Location: [facility];  Service: Plastics;  Laterality: Left;  . INCISION AND DRAINAGE OF WOUND  04/17/2012   Procedure: IRRIGATION AND DEBRIDEMENT WOUND;  Surgeon: Theodoro Kos, DO;  Location: [facility];  Service: Plastics;  Laterality: N/A;  OF ABDOMINAL WOUND, SURGICAL PREP AND PLACEMENT OF VAC, REMOVAL FOREHEAD SKIN LESION  . INCISION AND DRAINAGE OF WOUND N/A 04/24/2012   Procedure: IRRIGATION AND DEBRIDEMENT WOUND;  Surgeon: Theodoro Kos, DO;  Location: [facility];  Service: Plastics;  Laterality: N/A;  I & D ABDOMINAL  WOUND WITH VAC AND ACELL  . INCISION AND DRAINAGE OF WOUND N/A 05/01/2012   Procedure: IRRIGATION AND DEBRIDEMENT WOUND;  Surgeon: Theodoro Kos, DO;  Location: [facility];  Service: Plastics;  Laterality: N/A;  WITH SURGICAL PREP AND PLACEMENT OF VAC  . INCISION AND DRAINAGE OF WOUND N/A 05/08/2012   Procedure: IRRIGATION AND DEBRIDEMENT OF ABD WOUND SURGICAL PREP AND PLACEMENT OF VAC ;  Surgeon: Theodoro Kos, DO;  Location: [facility];  Service: Plastics;  Laterality: N/A;  IRRIGATION AND DEBRIDEMENT OF ABD WOUND SURGICAL PREP AND PLACEMENT OF VAC   . INCISION AND DRAINAGE OF WOUND N/A 05/15/2012   Procedure: IRRIGATION AND DEBRIDEMENT OF ABDOMINAL WOUND WITH POSSIBLE SURGICAL PREP AND PLACEMENT OF VAC;   Surgeon: Theodoro Kos, DO;  Location: [facility];  Service: Plastics;  Laterality: N/A;  . INCISION AND DRAINAGE OF WOUND N/A 05/22/2012   Procedure: IRRIGATION AND DEBRIDEMENT OF ABODOMINAL WOUND WITH  SURGICAL PREP AND VAC PLACEMENT;  Surgeon: Theodoro Kos, DO;  Location: [facility];  Service: Plastics;  Laterality: N/A;  . INCISION AND DRAINAGE OF WOUND N/A 06/28/2012   Procedure: IRRIGATION AND DEBRIDEMENT OF ABDOMINAL ULCER SURGICAL PREP AND PLACEMENT OF ACELL AND VAC;  Surgeon: Theodoro Kos, DO;  Location: [facility];  Service: Plastics;  Laterality: N/A;  . INCISIONAL HERNIA REPAIR  02/07/2012   Procedure: HERNIA REPAIR INCISIONAL;  Surgeon: Gayland Curry, MD,FACS;  Location: [facility];  Service: General;;  Open, Primary repair, strangulated Incisional hernia.  Marland Kitchen LESION REMOVAL  04/17/2012   Procedure: LESION REMOVAL;  Surgeon: Theodoro Kos, DO;  Location: [facility];  Service: Plastics;  Laterality: N/A;   CENTER OF FOREHEAD, REMOVAL FORHEAD SKIN LESION  . ORIF TOE FRACTURE Left 02/09/2017   Procedure: EXCISION TALAR HEAD, INTERNAL FIXATION MEDIAL COLUMN LEFT FOOT;  Surgeon: Newt Minion, MD;  Location: [facility];  Service: Orthopedics;  Laterality: Left;  . POLYPECTOMY N/A 05/14/2014   Procedure: ENDOMETRIAL POLYPECTOMY;  Surgeon: Jonnie Kind, MD;  Location: [facility];  Service: Gynecology;  Laterality: N/A;  . STUMP REVISION Left 08/31/2017   Procedure: REVISION LEFT BELOW KNEE AMPUTATION;  Surgeon: Newt Minion, MD;  Location: [facility];  Service: Orthopedics;  Laterality: Left;    There were no vitals filed for this visit.  Subjective Assessment - 03/31/18 0854    Subjective  No falls to report, changes made by prosthetist has helped.     Patient is accompained by:  Family member    Pertinent History  L TTA, bradycardia, depression, lymphedema, DM2, HLD, HTN, CAD, MI, CABG, CA basal cell face, bowel resection,     Limitations   Lifting;Standing;Walking;House hold activities    Patient Stated Goals  To use prosthesis to get in/out cars, bathrooms, access community & home    Currently in Pain?  No/denies        [facility] Adult PT Treatment/Exercise - 03/31/18 0858      Transfers   Transfers  Sit to Stand;Stand to Sit    Sit to Stand  5: Supervision    Stand to Sit  5: Supervision      Ambulation/Gait   Ambulation/Gait  Yes    Ambulation/Gait Assistance  5: Supervision;4: Min guard    Ambulation/Gait Assistance Details  VC's for posture and foot placement inside of RW.     Ambulation Distance (Feet)  115 Feet    Assistive device  Rolling walker;Prosthesis  Gait Pattern  Step-through pattern;Decreased step length - right;Decreased stance time - left;Right hip hike;Left flexed knee in stance;Right flexed knee in stance;Antalgic;Lateral hip instability    Ambulation Surface  Level;Indoor    Stairs  Yes    Stairs Assistance  5: Supervision    Stair Management Technique  Two rails;Forwards    Number of Stairs  4    Height of Stairs  6    Ramp  Other (comment)   Min guard   Ramp Details (indicate cue type and reason)  with RW/Prosthesis    Curb  Other (comment)   Min guard   Curb Details (indicate cue type and reason)  With RW/Prosthesis      Neuro Re-ed    Neuro Re-ed Details   Static standing with no UE support progressing to dynamic standing balance while reaching outside BOS/across midline/to floor to grab objects with no UE support required or LOB noted.       Prosthetics   Prosthetic Care Comments   Increased wear time to 7hrs/2xday    Current prosthetic wear tolerance (days/week)   daily    Current prosthetic wear tolerance (#hours/day)   6hrs 2x/day with 1 hr off.     Residual limb condition   No skin issues    Education Provided  Residual limb care;Proper wear schedule/adjustment;Correct ply sock adjustment    Person(s) Educated  Patient    Education Method  Explanation    Education Method   Verbalized understanding         PT Short Term Goals - 03/14/18 1200      PT SHORT TERM GOAL #1   Title  Patient verbalizes adjusting ply socks to adjust limb volume changes. (All updated STGs Target Date: 04/14/2018)    Time  1    Period  Months    Status  New    Target Date  04/14/18      PT SHORT TERM GOAL #2   Title  Patient tolerates prosthesis wear >90% of awake hours with PT guidance for skin & limb pain issues.     Time  1    Period  Months    Status  New    Target Date  04/14/18      PT SHORT TERM GOAL #3   Title  Patient able to stand 2 minutes without UE support with supervision.     Time  1    Period  Months    Status  New    Target Date  04/14/18      PT SHORT TERM GOAL #4   Title  Patient ambulates 200' with RW & prosthesis with supervision.     Time  1    Period  Months    Status  Revised    Target Date  04/14/18      PT SHORT TERM GOAL #5   Title  Patient negotiates ramps & curbs with RW & prosthesis with supervision.     Time  1    Period  Months    Status  Revised    Target Date  04/14/18        PT Long Term Goals - 02/23/18 1135      PT LONG TERM GOAL #1   Title  Patient demonstrates and verbalizes proper prosthetic care to enable safe use of prosthesis (All LTGs Target Date: 05/12/2018)    Time  3    Period  Months    Status  On-going  Target Date  05/12/18      PT LONG TERM GOAL #2   Title  Patient tolerates wear of prosthesis >90% of awake hours without skin or limb pain issues to enable function throughout her day.     Time  3    Period  Months    Status  On-going    Target Date  05/12/18      PT LONG TERM GOAL #3   Title  Lillie Fragmin >36/56    Time  3    Period  Months    Status  On-going    Target Date  05/12/18      PT LONG TERM GOAL #4   Title  Patient ambulates 300' with LRAD & prosthesis modified independent for basic community mobility.     Time  3    Period  Months    Status  On-going    Target Date  05/12/18       PT LONG TERM GOAL #5   Title  Patient negotiates ramps, curbs & stairs with LRAD & prosthesis modified independent for community access.     Time  3    Period  Months    Status  On-going    Target Date  05/12/18            Plan - 03/31/18 1437    Clinical Impression Statement  Todays skilled session focused on gait training with prosthesis/RW while negotiating curbs/stairs/ramps with supervision/min guard for safety, prosthetic care with increase in daily wear time and dynamic balance with no UE support. Pt should benefit from continued PT sessions to progress towards goals.     Rehab Potential  Good    PT Frequency  2x / week    PT Duration  Other (comment)   13 weeks (90 days)   PT Treatment/Interventions  ADLs/Self Care Home Management;DME Instruction;Gait training;Stair training;Functional mobility training;Therapeutic activities;Therapeutic exercise;Balance training;Neuromuscular re-education;Patient/family education;Prosthetic Training;Manual techniques;Vestibular    PT Next Visit Plan  pt wants to try a cane with gait- start with laps around a table; work toward updated STGs, begin to work on balance activities.     Consulted and Agree with Plan of Care  Patient;Family member/caregiver    Family Member Consulted  husband who is deaf       Patient will benefit from skilled therapeutic intervention in order to improve the following deficits and impairments:  Abnormal gait, Decreased activity tolerance, Decreased balance, Cardiopulmonary status limiting activity, Decreased coordination, Decreased endurance, Decreased knowledge of use of DME, Decreased mobility, Decreased range of motion, Decreased skin integrity, Decreased strength, Dizziness, Impaired flexibility, Increased edema, Obesity, Prosthetic Dependency, Postural dysfunction  Visit Diagnosis: Other abnormalities of gait and mobility  Unsteadiness on feet  Abnormal posture  Muscle weakness  (generalized)     Problem List Patient Active Problem List   Diagnosis Date Noted  . Dyslipidemia 11/23/2017  . Below knee amputation status, left 08/31/2017  . Dehiscence of amputation stump ([place]) 08/29/2017  . Infection of amputation stump, left lower extremity ([place]) 06/19/2017  . Bradycardia 05/07/2017  . Depression 05/07/2017  . Lymphedema 05/07/2017  . Charcot foot due to diabetes mellitus ([place]) 02/09/2017  . 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 Post-menopausal bleeding 04/15/2014  . Endometrial polyp 04/09/2014  . Ventral hernia 03/06/2012  . Open abdominal wall wound 03/06/2012  . Hyperlipemia 11/12/2008  . OBESITY, MORBID 11/12/2008  . Essential hypertension 11/12/2008  . CAD (coronary artery disease)  11/12/2008   Olinda Nola, PTA  Dwayna Kentner A Teryl Mcconaghy 03/31/2018, 2:40 PM  [facility] [address] [place], [place], [address] Phone: [phone]   Fax:  [phone]  Name: KEAGAN ANTHIS MRN: [id number] Date of Birth: 1955/05/23

## 2018-03-31 NOTE — Progress Notes (Signed)
Discontinued Cozaar per Dr. Livia Snellen written order on faxed med clarification request to [facility], chart updated.

## 2018-04-03 ENCOUNTER — Encounter: Payer: Self-pay | Admitting: Physical Therapy

## 2018-04-03 ENCOUNTER — Ambulatory Visit: Payer: Medicare Other | Admitting: Physical Therapy

## 2018-04-03 DIAGNOSIS — R2689 Other abnormalities of gait and mobility: Secondary | ICD-10-CM

## 2018-04-03 DIAGNOSIS — M6281 Muscle weakness (generalized): Secondary | ICD-10-CM | POA: Diagnosis not present

## 2018-04-03 DIAGNOSIS — R2681 Unsteadiness on feet: Secondary | ICD-10-CM | POA: Diagnosis not present

## 2018-04-03 DIAGNOSIS — M25661 Stiffness of right knee, not elsewhere classified: Secondary | ICD-10-CM | POA: Diagnosis not present

## 2018-04-03 DIAGNOSIS — M25662 Stiffness of left knee, not elsewhere classified: Secondary | ICD-10-CM | POA: Diagnosis not present

## 2018-04-03 DIAGNOSIS — R293 Abnormal posture: Secondary | ICD-10-CM

## 2018-04-03 NOTE — Therapy (Signed)
[facility] [address] [place], [place], [address] Phone: [phone]   Fax:  [phone]  Physical Therapy Treatment  Patient Details  Name: Julia West MRN: [id number] Date of Birth: [birth year] Referring Provider (PT): Meridee Score, MD   Encounter Date: 04/03/2018  PT End of Session - 04/03/18 1017    Visit Number  15    Number of Visits  26    Date for PT Re-Evaluation  05/12/18    Authorization Type  Medicare & Medicaid     PT Start Time  0933    PT Stop Time  1015    PT Time Calculation (min)  42 min    Equipment Utilized During Treatment  Gait belt    Activity Tolerance  Patient tolerated treatment well;Patient limited by fatigue    Behavior During Therapy  [facility] for tasks assessed/performed       Past Medical History:  Diagnosis Date  . Acute myocardial infarction    with a ruptured plaque in the circumflex in 2003  . Anxiety   . Cancer ([place])    basal cell face  . Complication of anesthesia    states low O2 sats post-op 11/13, always slow to awaken  . Degenerative joint disease   . 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 Dyspnea    with activity- car to house - steps  . HTN (hypertension)   . Hyperlipidemia LDL goal <70   . Internal and external hemorrhoids without complication   . Lymphedema    right side of body  . Lymphedema of arm    right  . Morbid obesity ([place])   . Surgical wound, non healing ABDOMINAL    has wound vac @ 125 mm Hg    Past Surgical History:  Procedure Laterality Date  . AMPUTATION Left 05/06/2017   Procedure: BELOW KNEE AMPUTATION;  Surgeon: Newt Minion, MD;  Location: [facility];  Service: Orthopedics;  Laterality: Left;  . APPLICATION OF WOUND VAC    . BOWEL RESECTION  02/07/2012   Procedure: SMALL BOWEL RESECTION;  Surgeon: Gayland Curry, MD,FACS;  Location: [facility];  Service: General;;  . CARDIOVASCULAR STRESS TEST  01-17-2012  DR HOCHREIN   LOW RISK  NUCLEAR TEST  . CESAREAN SECTION     x 4 in remote past  . CHOLECYSTECTOMY    . CORONARY ARTERY BYPASS GRAFT  2003   by Dr. Servando Snare. LIMA to the LAD, free RIMA to the circumflex. Stress perfusion study December 2009 with no high-risk areas of ischemia. She has a well-preserved ejection fraction  . HYSTEROSCOPY W/D&C N/A 05/14/2014   Procedure: DILATATION AND CURETTAGE (no specimen); HYSTEROSCOPY;  Surgeon: Jonnie Kind, MD;  Location: [facility];  Service: Gynecology;  Laterality: N/A;  . I&D EXTREMITY Left 06/21/2017   Procedure: IRRIGATION AND DEBRIDEMENT OF LEFT LEG AMPUTATION SITE;  Surgeon: Wallace Going, DO;  Location: [facility];  Service: Plastics;  Laterality: Left;  . INCISION AND DRAINAGE OF WOUND  04/17/2012   Procedure: IRRIGATION AND DEBRIDEMENT WOUND;  Surgeon: Theodoro Kos, DO;  Location: [facility];  Service: Plastics;  Laterality: N/A;  OF ABDOMINAL WOUND, SURGICAL PREP AND PLACEMENT OF VAC, REMOVAL FOREHEAD SKIN LESION  . INCISION AND DRAINAGE OF WOUND N/A 04/24/2012   Procedure: IRRIGATION AND DEBRIDEMENT WOUND;  Surgeon: Theodoro Kos, DO;  Location: [facility];  Service: Plastics;  Laterality: N/A;  I & D ABDOMINAL  WOUND WITH VAC AND ACELL  . INCISION AND DRAINAGE OF WOUND N/A 05/01/2012   Procedure: IRRIGATION AND DEBRIDEMENT WOUND;  Surgeon: Theodoro Kos, DO;  Location: [facility];  Service: Plastics;  Laterality: N/A;  WITH SURGICAL PREP AND PLACEMENT OF VAC  . INCISION AND DRAINAGE OF WOUND N/A 05/08/2012   Procedure: IRRIGATION AND DEBRIDEMENT OF ABD WOUND SURGICAL PREP AND PLACEMENT OF VAC ;  Surgeon: Theodoro Kos, DO;  Location: [facility];  Service: Plastics;  Laterality: N/A;  IRRIGATION AND DEBRIDEMENT OF ABD WOUND SURGICAL PREP AND PLACEMENT OF VAC   . INCISION AND DRAINAGE OF WOUND N/A 05/15/2012   Procedure: IRRIGATION AND DEBRIDEMENT OF ABDOMINAL WOUND WITH POSSIBLE SURGICAL PREP AND PLACEMENT OF VAC;   Surgeon: Theodoro Kos, DO;  Location: [facility];  Service: Plastics;  Laterality: N/A;  . INCISION AND DRAINAGE OF WOUND N/A 05/22/2012   Procedure: IRRIGATION AND DEBRIDEMENT OF ABODOMINAL WOUND WITH  SURGICAL PREP AND VAC PLACEMENT;  Surgeon: Theodoro Kos, DO;  Location: [facility];  Service: Plastics;  Laterality: N/A;  . INCISION AND DRAINAGE OF WOUND N/A 06/28/2012   Procedure: IRRIGATION AND DEBRIDEMENT OF ABDOMINAL ULCER SURGICAL PREP AND PLACEMENT OF ACELL AND VAC;  Surgeon: Theodoro Kos, DO;  Location: [facility];  Service: Plastics;  Laterality: N/A;  . INCISIONAL HERNIA REPAIR  02/07/2012   Procedure: HERNIA REPAIR INCISIONAL;  Surgeon: Gayland Curry, MD,FACS;  Location: [facility];  Service: General;;  Open, Primary repair, strangulated Incisional hernia.  Marland Kitchen LESION REMOVAL  04/17/2012   Procedure: LESION REMOVAL;  Surgeon: Theodoro Kos, DO;  Location: [facility];  Service: Plastics;  Laterality: N/A;   CENTER OF FOREHEAD, REMOVAL FORHEAD SKIN LESION  . ORIF TOE FRACTURE Left 02/09/2017   Procedure: EXCISION TALAR HEAD, INTERNAL FIXATION MEDIAL COLUMN LEFT FOOT;  Surgeon: Newt Minion, MD;  Location: [facility];  Service: Orthopedics;  Laterality: Left;  . POLYPECTOMY N/A 05/14/2014   Procedure: ENDOMETRIAL POLYPECTOMY;  Surgeon: Jonnie Kind, MD;  Location: [facility];  Service: Gynecology;  Laterality: N/A;  . STUMP REVISION Left 08/31/2017   Procedure: REVISION LEFT BELOW KNEE AMPUTATION;  Surgeon: Newt Minion, MD;  Location: [facility];  Service: Orthopedics;  Laterality: Left;    There were no vitals filed for this visit.  Subjective Assessment - 04/03/18 0935    Subjective  No falls to report. Pt worked on functional standing activity with walker (making soup) and walking around kitchen, and to bathroom with RW from various sitting surfaces.    Patient is accompained by:  Family member    Pertinent History  L TTA, bradycardia,  depression, lymphedema, DM2, HLD, HTN, CAD, MI, CABG, CA basal cell face, bowel resection,     Limitations  Lifting;Standing;Walking;House hold activities    Patient Stated Goals  To use prosthesis to get in/out cars, bathrooms, access community & home    Currently in Pain?  No/denies                       [facility] Adult PT Treatment/Exercise - 04/03/18 0001      Transfers   Transfers  Sit to Stand;Stand to Sit    Sit to Stand  5: Supervision    Stand to Sit  5: Supervision      Ambulation/Gait   Ambulation/Gait  Yes    Ambulation/Gait Assistance  5: Supervision    Ambulation/Gait Assistance Details  cues  for posture, step length, and initial heelstrike    Ambulation Distance (Feet)  230 Feet    Assistive device  Rolling walker;Prosthesis    Gait Pattern  Step-through pattern;Decreased step length - right;Decreased stance time - left;Right hip hike;Left flexed knee in stance;Right flexed knee in stance;Antalgic;Lateral hip instability    Ambulation Surface  Level;Indoor    Stairs  Yes    Stairs Assistance  4: Min guard    Stairs Assistance Details (indicate cue type and reason)  training for alternate pattern    Stair Management Technique  Two rails;Forwards;Alternating pattern    Number of Stairs  4   x2   Ramp  5: Supervision;Other (comment)   Min guard to supervision   Ramp Details (indicate cue type and reason)  with RW    Curb  5: Supervision    Curb Details (indicate cue type and reason)  with RW; min cues for technique      Prosthetics   Prosthetic Care Comments   Reviewed POC and STGs.    Current prosthetic wear tolerance (days/week)   daily    Current prosthetic wear tolerance (#hours/day)   7 hrs 2x/day    Residual limb condition   No skin issues    Person(s) Educated  Patient    Education Method  Explanation    Education Method  Verbalized understanding          Balance Exercises - 04/03/18 1002      Balance Exercises: Standing   Standing Eyes  Opened  Wide (BOA)   static for about 2 min without UE support. picking up object from floor.       PT Education - 04/03/18 1228    Education Details  Reviewed POC and STGs.    Person(s) Educated  Patient    Methods  Explanation    Comprehension  Verbalized understanding       PT Short Term Goals - 03/14/18 1200      PT SHORT TERM GOAL #1   Title  Patient verbalizes adjusting ply socks to adjust limb volume changes. (All updated STGs Target Date: 04/14/2018)    Time  1    Period  Months    Status  New    Target Date  04/14/18      PT SHORT TERM GOAL #2   Title  Patient tolerates prosthesis wear >90% of awake hours with PT guidance for skin & limb pain issues.     Time  1    Period  Months    Status  New    Target Date  04/14/18      PT SHORT TERM GOAL #3   Title  Patient able to stand 2 minutes without UE support with supervision.     Time  1    Period  Months    Status  New    Target Date  04/14/18      PT SHORT TERM GOAL #4   Title  Patient ambulates 200' with RW & prosthesis with supervision.     Time  1    Period  Months    Status  Revised    Target Date  04/14/18      PT SHORT TERM GOAL #5   Title  Patient negotiates ramps & curbs with RW & prosthesis with supervision.     Time  1    Period  Months    Status  Revised    Target Date  04/14/18  PT Long Term Goals - 02/23/18 1135      PT LONG TERM GOAL #1   Title  Patient demonstrates and verbalizes proper prosthetic care to enable safe use of prosthesis (All LTGs Target Date: 05/12/2018)    Time  3    Period  Months    Status  On-going    Target Date  05/12/18      PT LONG TERM GOAL #2   Title  Patient tolerates wear of prosthesis >90% of awake hours without skin or limb pain issues to enable function throughout her day.     Time  3    Period  Months    Status  On-going    Target Date  05/12/18      PT LONG TERM GOAL #3   Title  Lillie Fragmin >36/56    Time  3    Period  Months     Status  On-going    Target Date  05/12/18      PT LONG TERM GOAL #4   Title  Patient ambulates 300' with LRAD & prosthesis modified independent for basic community mobility.     Time  3    Period  Months    Status  On-going    Target Date  05/12/18      PT LONG TERM GOAL #5   Title  Patient negotiates ramps, curbs & stairs with LRAD & prosthesis modified independent for community access.     Time  3    Period  Months    Status  On-going    Target Date  05/12/18            Plan - 04/03/18 1229    Clinical Impression Statement  Skilled session focused on increasing gait distance, negotiating community obstacles with RW, and standing balance without UE support.  Pt is progressing well increasing stability and activity tolerance with gait. Standing balance without UE support is limited due to chronic pain in R knee, but was able to perfom at supervision level.                                                                                                                                              Rehab Potential  Good    PT Frequency  2x / week    PT Duration  Other (comment)   13 weeks (90 days)   PT Treatment/Interventions  ADLs/Self Care Home Management;DME Instruction;Gait training;Stair training;Functional mobility training;Therapeutic activities;Therapeutic exercise;Balance training;Neuromuscular re-education;Patient/family education;Prosthetic Training;Manual techniques;Vestibular    PT Next Visit Plan  pt wants to try a cane with gait- start with laps around a table; work toward updated STGs, begin to work on balance activities.     Consulted and Agree with Plan of Care  Patient;Family member/caregiver    Family Member Consulted  husband who is deaf  Patient will benefit from skilled therapeutic intervention in order to improve the following deficits and impairments:  Abnormal gait, Decreased activity tolerance, Decreased balance, Cardiopulmonary status limiting  activity, Decreased coordination, Decreased endurance, Decreased knowledge of use of DME, Decreased mobility, Decreased range of motion, Decreased skin integrity, Decreased strength, Dizziness, Impaired flexibility, Increased edema, Obesity, Prosthetic Dependency, Postural dysfunction  Visit Diagnosis: Other abnormalities of gait and mobility  Unsteadiness on feet  Abnormal posture  Muscle weakness (generalized)     Problem List Patient Active Problem List   Diagnosis Date Noted  . Dyslipidemia 11/23/2017  . Below knee amputation status, left 08/31/2017  . Dehiscence of amputation stump ([place]) 08/29/2017  . Infection of amputation stump, left lower extremity ([place]) 06/19/2017  . Bradycardia 05/07/2017  . Depression 05/07/2017  . Lymphedema 05/07/2017  . Charcot foot due to diabetes mellitus ([place]) 02/09/2017  . 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 Post-menopausal bleeding 04/15/2014  . Endometrial polyp 04/09/2014  . Ventral hernia 03/06/2012  . Open abdominal wall wound 03/06/2012  . Hyperlipemia 11/12/2008  . OBESITY, MORBID 11/12/2008  . Essential hypertension 11/12/2008  . CAD (coronary artery disease) 11/12/2008    Bjorn Loser, PTA  04/03/18, 12:38 PM [facility] [address] [name], [place], [address] Phone: [phone]   Fax:  [phone]  Name: KAHLYN SHIPPEY MRN: [id number] Date of Birth: 03/01/1956

## 2018-04-05 ENCOUNTER — Encounter: Payer: Self-pay | Admitting: Physical Therapy

## 2018-04-05 ENCOUNTER — Ambulatory Visit: Payer: Medicare Other | Admitting: Physical Therapy

## 2018-04-05 DIAGNOSIS — R2689 Other abnormalities of gait and mobility: Secondary | ICD-10-CM

## 2018-04-05 DIAGNOSIS — M25662 Stiffness of left knee, not elsewhere classified: Secondary | ICD-10-CM

## 2018-04-05 DIAGNOSIS — M25661 Stiffness of right knee, not elsewhere classified: Secondary | ICD-10-CM

## 2018-04-05 DIAGNOSIS — R2681 Unsteadiness on feet: Secondary | ICD-10-CM | POA: Diagnosis not present

## 2018-04-05 DIAGNOSIS — M6281 Muscle weakness (generalized): Secondary | ICD-10-CM | POA: Diagnosis not present

## 2018-04-05 DIAGNOSIS — R293 Abnormal posture: Secondary | ICD-10-CM | POA: Diagnosis not present

## 2018-04-06 ENCOUNTER — Ambulatory Visit: Payer: Medicare Other | Admitting: Family Medicine

## 2018-04-06 NOTE — Therapy (Signed)
[facility] [address] [place], [place], [address] Phone: [phone]   Fax:  [phone]  Physical Therapy Treatment  Patient Details  Name: Julia West MRN: [id number] Date of Birth: 07-02-1955 Referring Provider (PT): Meridee Score, MD   Encounter Date: 04/05/2018  PT End of Session - 04/05/18 1015    Visit Number  16    Number of Visits  26    Date for PT Re-Evaluation  05/12/18    Authorization Type  Medicare & Medicaid     PT Start Time  0930    PT Stop Time  5361    PT Time Calculation (min)  45 min    Equipment Utilized During Treatment  Gait belt    Activity Tolerance  Patient tolerated treatment well;Patient limited by fatigue    Behavior During Therapy  [facility] for tasks assessed/performed       Past Medical History:  Diagnosis Date  . Acute myocardial infarction    with a ruptured plaque in the circumflex in 2003  . Anxiety   . 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 Complication of anesthesia    states low O2 sats post-op 11/13, always slow to awaken  . Degenerative joint disease   . 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 Dyspnea    with activity- car to house - steps  . HTN (hypertension)   . Hyperlipidemia LDL goal <70   . Internal and external hemorrhoids without complication   . Lymphedema    right side of body  . Lymphedema of arm    right  . Morbid obesity ([place])   . Surgical wound, non healing ABDOMINAL    has wound vac @ 125 mm Hg    Past Surgical History:  Procedure Laterality Date  . AMPUTATION Left 05/06/2017   Procedure: BELOW KNEE AMPUTATION;  Surgeon: Newt Minion, MD;  Location: [facility];  Service: Orthopedics;  Laterality: Left;  . APPLICATION OF WOUND VAC    . BOWEL RESECTION  02/07/2012   Procedure: SMALL BOWEL RESECTION;  Surgeon: Gayland Curry, MD,FACS;  Location: [facility];  Service: General;;  . CARDIOVASCULAR STRESS TEST  01-17-2012  DR HOCHREIN   LOW RISK  NUCLEAR TEST  . CESAREAN SECTION     x 4 in remote past  . CHOLECYSTECTOMY    . CORONARY ARTERY BYPASS GRAFT  2003   by Dr. Servando Snare. LIMA to the LAD, free RIMA to the circumflex. Stress perfusion study December 2009 with no high-risk areas of ischemia. She has a well-preserved ejection fraction  . HYSTEROSCOPY W/D&C N/A 05/14/2014   Procedure: DILATATION AND CURETTAGE (no specimen); HYSTEROSCOPY;  Surgeon: Jonnie Kind, MD;  Location: [facility];  Service: Gynecology;  Laterality: N/A;  . I&D EXTREMITY Left 06/21/2017   Procedure: IRRIGATION AND DEBRIDEMENT OF LEFT LEG AMPUTATION SITE;  Surgeon: Wallace Going, DO;  Location: [facility];  Service: Plastics;  Laterality: Left;  . INCISION AND DRAINAGE OF WOUND  04/17/2012   Procedure: IRRIGATION AND DEBRIDEMENT WOUND;  Surgeon: Theodoro Kos, DO;  Location: [facility];  Service: Plastics;  Laterality: N/A;  OF ABDOMINAL WOUND, SURGICAL PREP AND PLACEMENT OF VAC, REMOVAL FOREHEAD SKIN LESION  . INCISION AND DRAINAGE OF WOUND N/A 04/24/2012   Procedure: IRRIGATION AND DEBRIDEMENT WOUND;  Surgeon: Theodoro Kos, DO;  Location: [facility];  Service: Plastics;  Laterality: N/A;  I & D ABDOMINAL  WOUND WITH VAC AND ACELL  . INCISION AND DRAINAGE OF WOUND N/A 05/01/2012   Procedure: IRRIGATION AND DEBRIDEMENT WOUND;  Surgeon: Theodoro Kos, DO;  Location: [facility];  Service: Plastics;  Laterality: N/A;  WITH SURGICAL PREP AND PLACEMENT OF VAC  . INCISION AND DRAINAGE OF WOUND N/A 05/08/2012   Procedure: IRRIGATION AND DEBRIDEMENT OF ABD WOUND SURGICAL PREP AND PLACEMENT OF VAC ;  Surgeon: Theodoro Kos, DO;  Location: [facility];  Service: Plastics;  Laterality: N/A;  IRRIGATION AND DEBRIDEMENT OF ABD WOUND SURGICAL PREP AND PLACEMENT OF VAC   . INCISION AND DRAINAGE OF WOUND N/A 05/15/2012   Procedure: IRRIGATION AND DEBRIDEMENT OF ABDOMINAL WOUND WITH POSSIBLE SURGICAL PREP AND PLACEMENT OF VAC;   Surgeon: Theodoro Kos, DO;  Location: [facility];  Service: Plastics;  Laterality: N/A;  . INCISION AND DRAINAGE OF WOUND N/A 05/22/2012   Procedure: IRRIGATION AND DEBRIDEMENT OF ABODOMINAL WOUND WITH  SURGICAL PREP AND VAC PLACEMENT;  Surgeon: Theodoro Kos, DO;  Location: [facility];  Service: Plastics;  Laterality: N/A;  . INCISION AND DRAINAGE OF WOUND N/A 06/28/2012   Procedure: IRRIGATION AND DEBRIDEMENT OF ABDOMINAL ULCER SURGICAL PREP AND PLACEMENT OF ACELL AND VAC;  Surgeon: Theodoro Kos, DO;  Location: [facility];  Service: Plastics;  Laterality: N/A;  . INCISIONAL HERNIA REPAIR  02/07/2012   Procedure: HERNIA REPAIR INCISIONAL;  Surgeon: Gayland Curry, MD,FACS;  Location: [facility];  Service: General;;  Open, Primary repair, strangulated Incisional hernia.  Marland Kitchen LESION REMOVAL  04/17/2012   Procedure: LESION REMOVAL;  Surgeon: Theodoro Kos, DO;  Location: [facility];  Service: Plastics;  Laterality: N/A;   CENTER OF FOREHEAD, REMOVAL FORHEAD SKIN LESION  . ORIF TOE FRACTURE Left 02/09/2017   Procedure: EXCISION TALAR HEAD, INTERNAL FIXATION MEDIAL COLUMN LEFT FOOT;  Surgeon: Newt Minion, MD;  Location: [facility];  Service: Orthopedics;  Laterality: Left;  . POLYPECTOMY N/A 05/14/2014   Procedure: ENDOMETRIAL POLYPECTOMY;  Surgeon: Jonnie Kind, MD;  Location: [facility];  Service: Gynecology;  Laterality: N/A;  . STUMP REVISION Left 08/31/2017   Procedure: REVISION LEFT BELOW KNEE AMPUTATION;  Surgeon: Newt Minion, MD;  Location: [facility];  Service: Orthopedics;  Laterality: Left;    There were no vitals filed for this visit.  Subjective Assessment - 04/05/18 0927    Subjective  She is standing with walker at home. She is wearing prosthesis 13-14hrs with 1 hr off midday.     Patient is accompained by:  Family member    Pertinent History  L TTA, bradycardia, depression, lymphedema, DM2, HLD, HTN, CAD, MI, CABG, CA basal cell face,  bowel resection,     Limitations  Lifting;Standing;Walking;House hold activities    Patient Stated Goals  To use prosthesis to get in/out cars, bathrooms, access community & home    Currently in Pain?  No/denies                       [facility] Adult PT Treatment/Exercise - 04/05/18 0930      Transfers   Transfers  Sit to Stand;Stand to Sit    Sit to Stand  5: Supervision;With armrests;From chair/3-in-1;With upper extremity assist   to RW or cane   Stand to Sit  5: Supervision;With upper extremity assist;With armrests;To chair/3-in-1   from RW or cane     Ambulation/Gait   Ambulation/Gait  Yes    Ambulation/Gait Assistance  5: Supervision    Ambulation/Gait Assistance Details  Demo & verbal cues on proper hand hold technique along with gait belt for safety with family (not husband due to communication difficulties with his deafness).      Ambulation Distance (Feet)  30 Feet   30' X 2 with cane /HHA and 2x with cane/counter   Assistive device  Rolling walker;Prosthesis;Straight cane;1 person hand held assist   cane quad tip & HHA,  RW around kitchen   Gait Pattern  Decreased step length - right;Decreased stance time - left;Right hip hike;Left flexed knee in stance;Right flexed knee in stance;Antalgic;Lateral hip instability;Step-to pattern    Ambulation Surface  Level;Indoor      High Level Balance   High Level Balance Activities  Side stepping;Backward walking;Other (comment)   cane & counter support     Self-Care   Self-Care  --      Therapeutic Activites    Therapeutic Activities  ADL's    ADL's  PT demo standing with counter support, reaching in upper & lower cabinets using pelvic contact with counter for BUE reaching and sidestepping with counter. Pt return demo understanding with general supervision. She appears safe for limited amounts of time.       Prosthetics   Prosthetic Care Comments   Increase wear to all awake hours with drying q4 hrs or sooner with signs  of sweating.  Signs of sweating.     Current prosthetic wear tolerance (days/week)   daily    Current prosthetic wear tolerance (#hours/day)   all awake hours except 1 hr midday.     Residual limb condition   No skin issues    Education Provided  Skin check;Residual limb care;Proper Donning;Proper wear schedule/adjustment    Person(s) Educated  Patient    Education Method  Explanation;Verbal cues;Demonstration    Education Method  Verbalized understanding;Verbal cues required;Needs further [place] Prosthesis  Supervision    Doffing Prosthesis  Supervision               PT Short Term Goals - 04/05/18 1246      PT SHORT TERM GOAL #1   Title  Patient verbalizes adjusting ply socks to adjust limb volume changes. (All updated STGs Target Date: 04/14/2018)    Time  1    Period  Months    Status  New    Target Date  04/14/18      PT SHORT TERM GOAL #2   Title  Patient tolerates prosthesis wear >90% of awake hours with PT guidance for skin & limb pain issues.     Time  1    Period  Months    Status  On-going    Target Date  04/14/18      PT SHORT TERM GOAL #3   Title  Patient able to stand 2 minutes without UE support with supervision.     Time  1    Period  Months    Status  On-going    Target Date  04/14/18      PT SHORT TERM GOAL #4   Title  Patient ambulates 200' with RW & prosthesis with supervision.     Time  1    Period  Months    Status  On-going    Target Date  04/14/18      PT SHORT TERM GOAL #5   Title  Patient negotiates ramps & curbs with RW & prosthesis with supervision.  Time  1    Period  Months    Status  On-going    Target Date  04/14/18        PT Long Term Goals - 02/23/18 1135      PT LONG TERM GOAL #1   Title  Patient demonstrates and verbalizes proper prosthetic care to enable safe use of prosthesis (All LTGs Target Date: 05/12/2018)    Time  3    Period  Months    Status  On-going    Target Date  05/12/18      PT  LONG TERM GOAL #2   Title  Patient tolerates wear of prosthesis >90% of awake hours without skin or limb pain issues to enable function throughout her day.     Time  3    Period  Months    Status  On-going    Target Date  05/12/18      PT LONG TERM GOAL #3   Title  Lillie Fragmin >36/56    Time  3    Period  Months    Status  On-going    Target Date  05/12/18      PT LONG TERM GOAL #4   Title  Patient ambulates 300' with LRAD & prosthesis modified independent for basic community mobility.     Time  3    Period  Months    Status  On-going    Target Date  05/12/18      PT LONG TERM GOAL #5   Title  Patient negotiates ramps, curbs & stairs with LRAD & prosthesis modified independent for community access.     Time  3    Period  Months    Status  On-going    Target Date  05/12/18            Plan - 04/05/18 1247    Clinical Impression Statement  Today's skilled PT session focused on instruction in using prosthesis for standing kitchen activities and short distance gait with cane with family assistance. Pt seems to understand instructions today and plans to try ADLs at home.     Rehab Potential  Good    PT Frequency  2x / week    PT Duration  Other (comment)   13 weeks (90 days)   PT Treatment/Interventions  ADLs/Self Care Home Management;DME Instruction;Gait training;Stair training;Functional mobility training;Therapeutic activities;Therapeutic exercise;Balance training;Neuromuscular re-education;Patient/family education;Prosthetic Training;Manual techniques;Vestibular    PT Next Visit Plan  check STGs, begin to work on balance activities.     Consulted and Agree with Plan of Care  Patient;Family member/caregiver    Family Member Consulted  husband who is deaf       Patient will benefit from skilled therapeutic intervention in order to improve the following deficits and impairments:  Abnormal gait, Decreased activity tolerance, Decreased balance, Cardiopulmonary status  limiting activity, Decreased coordination, Decreased endurance, Decreased knowledge of use of DME, Decreased mobility, Decreased range of motion, Decreased skin integrity, Decreased strength, Dizziness, Impaired flexibility, Increased edema, Obesity, Prosthetic Dependency, Postural dysfunction  Visit Diagnosis: Other abnormalities of gait and mobility  Unsteadiness on feet  Abnormal posture  Muscle weakness (generalized)  Stiffness of left knee, not elsewhere classified  Stiffness of right knee, not elsewhere classified     Problem List Patient Active Problem List   Diagnosis Date Noted  . Dyslipidemia 11/23/2017  . Below knee amputation status, left 08/31/2017  . Dehiscence of amputation stump ([place]) 08/29/2017  . Infection of amputation stump, left  lower extremity ([place]) 06/19/2017  . Bradycardia 05/07/2017  . Depression 05/07/2017  . Lymphedema 05/07/2017  . Charcot foot due to diabetes mellitus ([place]) 02/09/2017  . 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 Post-menopausal bleeding 04/15/2014  . Endometrial polyp 04/09/2014  . Ventral hernia 03/06/2012  . Open abdominal wall wound 03/06/2012  . Hyperlipemia 11/12/2008  . OBESITY, MORBID 11/12/2008  . Essential hypertension 11/12/2008  . CAD (coronary artery disease) 11/12/2008    Jamey Reas PT, DPT 04/06/2018, 1:05 PM  [facility] [address] [place], [place], [address] Phone: [phone]   Fax:  [phone]  Name: NEHA WAIGHT MRN: [id number] Date of Birth: [birth year]

## 2018-04-08 DIAGNOSIS — R2681 Unsteadiness on feet: Secondary | ICD-10-CM | POA: Diagnosis not present

## 2018-04-08 DIAGNOSIS — S81009A Unspecified open wound, unspecified knee, initial encounter: Secondary | ICD-10-CM | POA: Diagnosis not present

## 2018-04-09 DIAGNOSIS — Z4789 Encounter for other orthopedic aftercare: Secondary | ICD-10-CM | POA: Diagnosis not present

## 2018-04-09 DIAGNOSIS — R2681 Unsteadiness on feet: Secondary | ICD-10-CM | POA: Diagnosis not present

## 2018-04-09 DIAGNOSIS — E11610 Type 2 diabetes mellitus with diabetic neuropathic arthropathy: Secondary | ICD-10-CM | POA: Diagnosis not present

## 2018-04-10 ENCOUNTER — Ambulatory Visit: Payer: Medicare Other | Admitting: Physical Therapy

## 2018-04-10 ENCOUNTER — Encounter: Payer: Self-pay | Admitting: Physical Therapy

## 2018-04-10 DIAGNOSIS — M6281 Muscle weakness (generalized): Secondary | ICD-10-CM | POA: Diagnosis not present

## 2018-04-10 DIAGNOSIS — R293 Abnormal posture: Secondary | ICD-10-CM | POA: Diagnosis not present

## 2018-04-10 DIAGNOSIS — M25661 Stiffness of right knee, not elsewhere classified: Secondary | ICD-10-CM | POA: Diagnosis not present

## 2018-04-10 DIAGNOSIS — R2689 Other abnormalities of gait and mobility: Secondary | ICD-10-CM

## 2018-04-10 DIAGNOSIS — R2681 Unsteadiness on feet: Secondary | ICD-10-CM | POA: Diagnosis not present

## 2018-04-10 DIAGNOSIS — M25662 Stiffness of left knee, not elsewhere classified: Secondary | ICD-10-CM | POA: Diagnosis not present

## 2018-04-10 NOTE — Therapy (Signed)
[facility] [address] [place], [place], [address] Phone: [phone]   Fax:  [phone]  Physical Therapy Treatment  Patient Details  Name: Julia West MRN: [id number] Date of Birth: 06-Mar-1956 Referring Provider (PT): Meridee Score, MD   Encounter Date: 04/10/2018  PT End of Session - 04/10/18 0937    Visit Number  17    Number of Visits  26    Date for PT Re-Evaluation  05/12/18    Authorization Type  Medicare & Medicaid     PT Start Time  0933    PT Stop Time  1015    PT Time Calculation (min)  42 min    Equipment Utilized During Treatment  Gait belt    Activity Tolerance  Patient tolerated treatment well;Patient limited by fatigue;Patient limited by pain    Behavior During Therapy  [facility] for tasks assessed/performed       Past Medical History:  Diagnosis Date  . Acute myocardial infarction    with a ruptured plaque in the circumflex in 2003  . Anxiety   . 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 Complication of anesthesia    states low O2 sats post-op 11/13, always slow to awaken  . Degenerative joint disease   . 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 Dyspnea    with activity- car to house - steps  . HTN (hypertension)   . Hyperlipidemia LDL goal <70   . Internal and external hemorrhoids without complication   . Lymphedema    right side of body  . Lymphedema of arm    right  . Morbid obesity ([place])   . Surgical wound, non healing ABDOMINAL    has wound vac @ 125 mm Hg    Past Surgical History:  Procedure Laterality Date  . AMPUTATION Left 05/06/2017   Procedure: BELOW KNEE AMPUTATION;  Surgeon: Newt Minion, MD;  Location: [facility];  Service: Orthopedics;  Laterality: Left;  . APPLICATION OF WOUND VAC    . BOWEL RESECTION  02/07/2012   Procedure: SMALL BOWEL RESECTION;  Surgeon: Gayland Curry, MD,FACS;  Location: [facility];  Service: General;;  . CARDIOVASCULAR STRESS TEST  01-17-2012   DR HOCHREIN   LOW RISK NUCLEAR TEST  . CESAREAN SECTION     x 4 in remote past  . CHOLECYSTECTOMY    . CORONARY ARTERY BYPASS GRAFT  2003   by Dr. Servando Snare. LIMA to the LAD, free RIMA to the circumflex. Stress perfusion study December 2009 with no high-risk areas of ischemia. She has a well-preserved ejection fraction  . HYSTEROSCOPY W/D&C N/A 05/14/2014   Procedure: DILATATION AND CURETTAGE (no specimen); HYSTEROSCOPY;  Surgeon: Jonnie Kind, MD;  Location: [facility];  Service: Gynecology;  Laterality: N/A;  . I&D EXTREMITY Left 06/21/2017   Procedure: IRRIGATION AND DEBRIDEMENT OF LEFT LEG AMPUTATION SITE;  Surgeon: Wallace Going, DO;  Location: [facility];  Service: Plastics;  Laterality: Left;  . INCISION AND DRAINAGE OF WOUND  04/17/2012   Procedure: IRRIGATION AND DEBRIDEMENT WOUND;  Surgeon: Theodoro Kos, DO;  Location: [facility];  Service: Plastics;  Laterality: N/A;  OF ABDOMINAL WOUND, SURGICAL PREP AND PLACEMENT OF VAC, REMOVAL FOREHEAD SKIN LESION  . INCISION AND DRAINAGE OF WOUND N/A 04/24/2012   Procedure: IRRIGATION AND DEBRIDEMENT WOUND;  Surgeon: Theodoro Kos, DO;  Location: [facility];  Service: Plastics;  Laterality: N/A;  I &  D ABDOMINAL WOUND WITH VAC AND ACELL  . INCISION AND DRAINAGE OF WOUND N/A 05/01/2012   Procedure: IRRIGATION AND DEBRIDEMENT WOUND;  Surgeon: Theodoro Kos, DO;  Location: [facility];  Service: Plastics;  Laterality: N/A;  WITH SURGICAL PREP AND PLACEMENT OF VAC  . INCISION AND DRAINAGE OF WOUND N/A 05/08/2012   Procedure: IRRIGATION AND DEBRIDEMENT OF ABD WOUND SURGICAL PREP AND PLACEMENT OF VAC ;  Surgeon: Theodoro Kos, DO;  Location: [facility];  Service: Plastics;  Laterality: N/A;  IRRIGATION AND DEBRIDEMENT OF ABD WOUND SURGICAL PREP AND PLACEMENT OF VAC   . INCISION AND DRAINAGE OF WOUND N/A 05/15/2012   Procedure: IRRIGATION AND DEBRIDEMENT OF ABDOMINAL WOUND WITH POSSIBLE SURGICAL PREP  AND PLACEMENT OF VAC;  Surgeon: Theodoro Kos, DO;  Location: [facility];  Service: Plastics;  Laterality: N/A;  . INCISION AND DRAINAGE OF WOUND N/A 05/22/2012   Procedure: IRRIGATION AND DEBRIDEMENT OF ABODOMINAL WOUND WITH  SURGICAL PREP AND VAC PLACEMENT;  Surgeon: Theodoro Kos, DO;  Location: [facility];  Service: Plastics;  Laterality: N/A;  . INCISION AND DRAINAGE OF WOUND N/A 06/28/2012   Procedure: IRRIGATION AND DEBRIDEMENT OF ABDOMINAL ULCER SURGICAL PREP AND PLACEMENT OF ACELL AND VAC;  Surgeon: Theodoro Kos, DO;  Location: [facility];  Service: Plastics;  Laterality: N/A;  . INCISIONAL HERNIA REPAIR  02/07/2012   Procedure: HERNIA REPAIR INCISIONAL;  Surgeon: Gayland Curry, MD,FACS;  Location: [facility];  Service: General;;  Open, Primary repair, strangulated Incisional hernia.  Marland Kitchen LESION REMOVAL  04/17/2012   Procedure: LESION REMOVAL;  Surgeon: Theodoro Kos, DO;  Location: [facility];  Service: Plastics;  Laterality: N/A;   CENTER OF FOREHEAD, REMOVAL FORHEAD SKIN LESION  . ORIF TOE FRACTURE Left 02/09/2017   Procedure: EXCISION TALAR HEAD, INTERNAL FIXATION MEDIAL COLUMN LEFT FOOT;  Surgeon: Newt Minion, MD;  Location: [facility];  Service: Orthopedics;  Laterality: Left;  . POLYPECTOMY N/A 05/14/2014   Procedure: ENDOMETRIAL POLYPECTOMY;  Surgeon: Jonnie Kind, MD;  Location: [facility];  Service: Gynecology;  Laterality: N/A;  . STUMP REVISION Left 08/31/2017   Procedure: REVISION LEFT BELOW KNEE AMPUTATION;  Surgeon: Newt Minion, MD;  Location: [facility];  Service: Orthopedics;  Laterality: Left;    There were no vitals filed for this visit.  Subjective Assessment - 04/10/18 0936    Subjective  No new complaints. No falls. Does report intermittent issues with socks- 2 ply not enough and 3 ply too much. Not all the time, just on occasion.    Patient is accompained by:  Family member    Pertinent History  L TTA, bradycardia,  depression, lymphedema, DM2, HLD, HTN, CAD, MI, CABG, CA basal cell face, bowel resection,     Limitations  Lifting;Standing;Walking;House hold activities    Patient Stated Goals  To use prosthesis to get in/out cars, bathrooms, access community & home    Currently in Pain?  No/denies            [facility] Adult PT Treatment/Exercise - 04/10/18 0938      Transfers   Transfers  Sit to Stand;Stand to Sit    Sit to Stand  5: Supervision;With armrests;From chair/3-in-1;With upper extremity assist    Sit to Stand Details (indicate cue type and reason)  needs UE support to stabliize in standing    Stand to Sit  5: Supervision;With upper extremity assist;With armrests;To chair/3-in-1    Stand to Sit  Details  needs UE support for stability in standing      Ambulation/Gait   Ambulation/Gait  Yes    Ambulation/Gait Assistance  5: Supervision    Ambulation/Gait Assistance Details  no assistance needed with use of RW, cues on posture and for equal step length only; up to min assist needed for gait with cane with cues on cane placement, weight shifting and step length. incr knee pain reported after 40 feet of gait with cane, resolved with seated rest break.     Ambulation Distance (Feet)  210 Feet   x1, around gym w/barriers, 40 x1 with cane   Assistive device  Rolling walker;Prosthesis;Straight cane   with rubber quad tip   Gait Pattern  Decreased step length - right;Decreased stance time - left;Right hip hike;Left flexed knee in stance;Right flexed knee in stance;Antalgic;Lateral hip instability;Step-to pattern    Ambulation Surface  Level;Indoor    Ramp  5: Supervision    Ramp Details (indicate cue type and reason)  with RW/prosthesis, cues on posture only    Curb  5: Supervision    Curb Details (indicate cue type and reason)  wiht RW/prosthesis, cues to stagger stance for improved balance with RW advancement    Pre-Gait Activities  with cane around table for 2 laps, cues on posture, sequencing  and step length. min guard assist for balance. Discussed that the pt cane practice with cane at home in this fashion.       Prosthetics   Current prosthetic wear tolerance (days/week)   daily    Current prosthetic wear tolerance (#hours/day)   all awake hours except 1 hr midday.     Residual limb condition   No skin issues    Donning Prosthesis  Supervision    Doffing Prosthesis  Supervision          PT Short Term Goals - 04/10/18 0939      PT SHORT TERM GOAL #1   Title  Patient verbalizes adjusting ply socks to adjust limb volume changes. (All updated STGs Target Date: 04/14/2018)    Baseline  04/10/18: met today    Status  Achieved      PT SHORT TERM GOAL #2   Title  Patient tolerates prosthesis wear >90% of awake hours with PT guidance for skin & limb pain issues.     Baseline  04/10/18: met today    Status  Achieved      PT SHORT TERM GOAL #3   Title  Patient able to stand 2 minutes without UE support with supervision.     Baseline  04/10/18: pt reports she did this 2 sessions ago    Time  --    Period  --    Status  Achieved      PT SHORT TERM GOAL #4   Title  Patient ambulates 200' with RW & prosthesis with supervision.     Baseline  04/10/18: met today    Time  --    Period  --    Status  Achieved      PT SHORT TERM GOAL #5   Title  Patient negotiates ramps & curbs with RW & prosthesis with supervision.     Baseline  04/10/18: met today    Time  --    Period  --    Status  Achieved        PT Long Term Goals - 02/23/18 1135      PT LONG TERM GOAL #1   Title  Patient demonstrates and verbalizes proper prosthetic care to enable safe use of prosthesis (All LTGs Target Date: 05/12/2018)    Time  3    Period  Months    Status  On-going    Target Date  05/12/18      PT LONG TERM GOAL #2   Title  Patient tolerates wear of prosthesis >90% of awake hours without skin or limb pain issues to enable function throughout her day.     Time  3    Period  Months    Status   On-going    Target Date  05/12/18      PT LONG TERM GOAL #3   Title  Lillie Fragmin >36/56    Time  3    Period  Months    Status  On-going    Target Date  05/12/18      PT LONG TERM GOAL #4   Title  Patient ambulates 300' with LRAD & prosthesis modified independent for basic community mobility.     Time  3    Period  Months    Status  On-going    Target Date  05/12/18      PT LONG TERM GOAL #5   Title  Patient negotiates ramps, curbs & stairs with LRAD & prosthesis modified independent for community access.     Time  3    Period  Months    Status  On-going    Target Date  05/12/18            Plan - 04/10/18 8185    Clinical Impression Statement  Today's skilled session focused on progress toward STGs with all goals met. Remainder of session continued to address gait with cane/prosthesis for short distances. The pt did endorce increased knee pain with use of cane that resolved with rest break. The pt is progressing toward goals and should benefit from continued PT to progress toward unmet goals.     Rehab Potential  Good    PT Frequency  2x / week    PT Duration  Other (comment)   13 weeks (90 days)   PT Treatment/Interventions  ADLs/Self Care Home Management;DME Instruction;Gait training;Stair training;Functional mobility training;Therapeutic activities;Therapeutic exercise;Balance training;Neuromuscular re-education;Patient/family education;Prosthetic Training;Manual techniques;Vestibular    PT Next Visit Plan  begin to work on balance activities, short distance gait with cane/prosthesis- pt interested in trying bil canes    Consulted and Agree with Plan of Care  Patient;Family member/caregiver    Family Member Consulted  husband who is deaf       Patient will benefit from skilled therapeutic intervention in order to improve the following deficits and impairments:  Abnormal gait, Decreased activity tolerance, Decreased balance, Cardiopulmonary status limiting activity,  Decreased coordination, Decreased endurance, Decreased knowledge of use of DME, Decreased mobility, Decreased range of motion, Decreased skin integrity, Decreased strength, Dizziness, Impaired flexibility, Increased edema, Obesity, Prosthetic Dependency, Postural dysfunction  Visit Diagnosis: Other abnormalities of gait and mobility  Unsteadiness on feet  Abnormal posture  Muscle weakness (generalized)     Problem List Patient Active Problem List   Diagnosis Date Noted  . Dyslipidemia 11/23/2017  . Below knee amputation status, left 08/31/2017  . Dehiscence of amputation stump ([place]) 08/29/2017  . Infection of amputation stump, left lower extremity ([place]) 06/19/2017  . Bradycardia 05/07/2017  . Depression 05/07/2017  . Lymphedema 05/07/2017  . Charcot foot due to diabetes mellitus ([place]) 02/09/2017  . 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 Post-menopausal bleeding 04/15/2014  . Endometrial polyp 04/09/2014  . Ventral hernia 03/06/2012  . Open abdominal wall wound 03/06/2012  . Hyperlipemia 11/12/2008  . OBESITY, MORBID 11/12/2008  . Essential hypertension 11/12/2008  . CAD (coronary artery disease) 11/12/2008    Willow Ora, PTA, [facility] [address] [place], [place] [address] [phone] 04/10/18, 10:58 AM   Name: CHYANE GREER MRN: [id number] Date of Birth: May 24, 1955

## 2018-04-11 ENCOUNTER — Encounter: Payer: Self-pay | Admitting: Family Medicine

## 2018-04-11 ENCOUNTER — Ambulatory Visit (INDEPENDENT_AMBULATORY_CARE_PROVIDER_SITE_OTHER): Payer: Medicare Other | Admitting: Family Medicine

## 2018-04-11 VITALS — BP 114/66 | HR 67 | Temp 97.5°F | Ht 66.0 in | Wt 270.2 lb

## 2018-04-11 DIAGNOSIS — J40 Bronchitis, not specified as acute or chronic: Secondary | ICD-10-CM | POA: Diagnosis not present

## 2018-04-11 DIAGNOSIS — I10 Essential (primary) hypertension: Secondary | ICD-10-CM

## 2018-04-11 DIAGNOSIS — E785 Hyperlipidemia, unspecified: Secondary | ICD-10-CM | POA: Diagnosis not present

## 2018-04-11 DIAGNOSIS — J329 Chronic sinusitis, unspecified: Secondary | ICD-10-CM

## 2018-04-11 MED ORDER — VALSARTAN-HYDROCHLOROTHIAZIDE 160-12.5 MG PO TABS: 1.0000 | ORAL_TABLET | Freq: Every day | ORAL | 0 refills | Status: DC

## 2018-04-11 MED ORDER — SERTRALINE HCL 50 MG PO TABS: 50.0000 mg | ORAL_TABLET | Freq: Every day | ORAL | 1 refills | Status: DC

## 2018-04-11 MED ORDER — AMOXICILLIN-POT CLAVULANATE 875-125 MG PO TABS: 1.0000 | ORAL_TABLET | Freq: Two times a day (BID) | ORAL | 0 refills | Status: DC

## 2018-04-11 MED ORDER — VALSARTAN-HYDROCHLOROTHIAZIDE 160-12.5 MG PO TABS: 1.0000 | ORAL_TABLET | Freq: Every day | ORAL | 1 refills | Status: DC

## 2018-04-11 MED ORDER — PSEUDOEPHEDRINE-GUAIFENESIN ER 120-1200 MG PO TB12: 1.0000 | ORAL_TABLET | Freq: Two times a day (BID) | ORAL | 0 refills | Status: DC

## 2018-04-11 MED ORDER — SERTRALINE HCL 50 MG PO TABS
50.0000 mg | ORAL_TABLET | Freq: Every day | ORAL | 0 refills | Status: DC
Start: 2018-04-11 — End: 2018-09-11

## 2018-04-11 MED ORDER — TRAZODONE HCL 150 MG PO TABS: ORAL_TABLET | ORAL | 1 refills | Status: DC

## 2018-04-11 MED ORDER — FENOFIBRATE 160 MG PO TABS: ORAL_TABLET | ORAL | 1 refills | Status: DC

## 2018-04-11 NOTE — Progress Notes (Signed)
Subjective:  Patient ID: Julia West, female    DOB: [birth year]  Age: 63 y.o. MRN: [id number]  CC: Medical Management of Chronic Issues   HPI Julia West presents for follow-up of hypertension. Patient has no history of headache chest pain or shortness of breath or recent cough. Patient also denies symptoms of TIA such as numbness weakness lateralizing. Patient checks  blood pressure at home and has not had any elevated readings recently. Patient denies side effects from his medication. States taking it regularly. Patient in for follow-up of elevated cholesterol. Doing well without complaints on current medication. Denies side effects of statin including myalgia and arthralgia and nausea. Also in today for liver function testing. Currently no chest pain, shortness of breath or other cardiovascular related symptoms noted.  Symptoms include congestion, facial pain, nasal congestion, non productive cough, post nasal drip and sinus pressure. There is no fever, chills, or sweats. Onset of symptoms was a few days ago, gradually worsening since that time.   History Julia West has a past medical history of Acute myocardial infarction, Anxiety, Cancer ([place]), Complication of anesthesia, Degenerative joint disease, Dehiscence of amputation stump ([place]), Dyspnea, HTN (hypertension), Hyperlipidemia LDL goal <70, Internal and external hemorrhoids without complication, Lymphedema, Lymphedema of arm, Morbid obesity ([place]), and Surgical wound, non healing ([facility]DOMINAL ).   She has a past surgical history that includes Cesarean section; Cholecystectomy; Coronary artery bypass graft (2003); Incisional hernia repair (02/07/2012); Bowel resection (02/07/2012); Application if wound vac; Incision and drainage of wound (04/17/2012); Lesion removal (04/17/2012); Incision and drainage of wound (N/A, 04/24/2012); Incision and drainage of wound (N/A, 05/01/2012); Incision and drainage of wound (N/A, 05/08/2012);  Cardiovascular stress test (01-17-2012  [facility]); Incision and drainage of wound (N/A, 05/15/2012); Incision and drainage of wound (N/A, 05/22/2012); Incision and drainage of wound (N/A, 06/28/2012); polypectomy (N/A, 05/14/2014); Hysteroscopy w/D&C (N/A, 05/14/2014); ORIF toe fracture (Left, 02/09/2017); Amputation (Left, 05/06/2017); I&D extremity (Left, 06/21/2017); and Stump revision (Left, 08/31/2017).   Her family history includes Birth defects in her son; Diabetes in her brother; Heart attack in her mother; Hypertension in her sister.She reports that she quit smoking about 6 years ago. Her smoking use included cigarettes. She has a 25.00 pack-year smoking history. She has never used smokeless tobacco. She reports that she does not drink alcohol or use drugs.  Current Outpatient Medications on File Prior to Visit  Medication Sig Dispense Refill  . aspirin 81 MG tablet Take 81 mg by mouth daily.     . fexofenadine (ALLEGRA) 180 MG tablet Take 180 mg by mouth as needed.     . Multiple Vitamins-Minerals (MULTIVITAMIN ADULT) TABS Take 1 tablet by mouth daily.    . nitroGLYCERIN (NITROSTAT) 0.4 MG SL tablet Place 1 tablet (0.4 mg total) under the tongue every 5 (five) minutes x 3 doses as needed for chest pain. If pain persist after call 911 25 tablet 6   No current facility-administered medications on file prior to visit.     ROS Review of Systems  Constitutional: Negative.  Negative for activity change, appetite change, chills and fever.  HENT: Positive for postnasal drip, rhinorrhea and sinus pressure. Negative for congestion, ear discharge, ear pain, hearing loss, nosebleeds, sneezing and trouble swallowing.   Eyes: Negative for visual disturbance.  Respiratory: Negative for chest tightness and shortness of breath.   Cardiovascular: Negative for chest pain and palpitations.  Gastrointestinal: Negative for abdominal pain, constipation, diarrhea, nausea and vomiting.  Genitourinary: Negative for  difficulty urinating.  Musculoskeletal: Negative for arthralgias and myalgias.  Skin: Negative for rash.  Neurological: Negative for headaches.  Psychiatric/Behavioral: Negative for sleep disturbance.    Objective:  BP 114/66   Pulse 67   Temp (!) 97.5 F (36.4 C) (Oral)   Ht '5\' 6"'$  (1.676 m)   Wt 270 lb 4 oz (122.6 kg)   BMI 43.62 kg/m   BP Readings from Last 3 Encounters:  04/11/18 114/66  11/23/17 136/66  09/06/17 (!) 127/46    Wt Readings from Last 3 Encounters:  04/11/18 270 lb 4 oz (122.6 kg)  02/13/18 259 lb (117.5 kg)  12/15/17 259 lb (117.5 kg)     Physical Exam Constitutional:      General: She is not in acute distress.    Appearance: She is well-developed.  HENT:     Head: Normocephalic and atraumatic.     Right Ear: Tympanic membrane and external ear normal. No decreased hearing noted.     Left Ear: Tympanic membrane and external ear normal. No decreased hearing noted.     Nose: Nose normal.     Right Sinus: No frontal sinus tenderness.     Left Sinus: No frontal sinus tenderness.     Mouth/Throat:     Pharynx: No oropharyngeal exudate or posterior oropharyngeal erythema.  Eyes:     Conjunctiva/sclera: Conjunctivae normal.     Pupils: Pupils are equal, round, and reactive to light.  Neck:     Musculoskeletal: Normal range of motion and neck supple.     Thyroid: No thyromegaly.     Meningeal: Brudzinski's sign absent.  Cardiovascular:     Rate and Rhythm: Normal rate and regular rhythm.     Heart sounds: Normal heart sounds. No murmur.  Pulmonary:     Effort: Pulmonary effort is normal. No respiratory distress.     Breath sounds: Normal breath sounds. No wheezing or rales.  Abdominal:     General: Bowel sounds are normal. There is no distension.     Palpations: Abdomen is soft.     Tenderness: There is no abdominal tenderness.  Lymphadenopathy:     Head:     Right side of head: No preauricular adenopathy.     Left side of head: No preauricular  adenopathy.     Cervical: No cervical adenopathy.     Right cervical: No superficial cervical adenopathy.    Left cervical: No superficial cervical adenopathy.  Skin:    General: Skin is warm and dry.  Neurological:     Mental Status: She is alert and oriented to person, place, and time.     Deep Tendon Reflexes: Reflexes are normal and symmetric.  Psychiatric:        Behavior: Behavior normal.        Thought Content: Thought content normal.        Judgment: Judgment normal.       Assessment & Plan:   Julia West was seen today for medical management of chronic issues.  Diagnoses and all orders for this visit:  Essential hypertension  Hyperlipidemia, unspecified hyperlipidemia type -     fenofibrate 160 MG tablet; TAKE 1 TABLET BY MOUTH  DAILY FOR CHOLESTEROL AND  TRIGLYCERIDES -     CBC with Differential/Platelet -     CMP14+EGFR  Sinobronchitis  Other orders -     Discontinue: sertraline (ZOLOFT) 50 MG tablet; Take 1 tablet (50 mg total) by mouth daily. -     traZODone (DESYREL) 150 MG tablet; TAKE 1 OR  2 TABLETS BY  MOUTH AT BEDTIME AS NEEDED  FOR SLEEP -     Discontinue: valsartan-hydrochlorothiazide (DIOVAN-HCT) 160-12.5 MG tablet; Take 1 tablet by mouth daily. -     sertraline (ZOLOFT) 50 MG tablet; Take 1 tablet (50 mg total) by mouth daily. -     valsartan-hydrochlorothiazide (DIOVAN-HCT) 160-12.5 MG tablet; Take 1 tablet by mouth daily. -     amoxicillin-clavulanate (AUGMENTIN) 875-125 MG tablet; Take 1 tablet by mouth 2 (two) times daily. Take all of this medication -     Pseudoephedrine-Guaifenesin [id number] MG TB12; Take 1 tablet by mouth 2 (two) times daily. For congestion      I have discontinued Julia West's hydrochlorothiazide. I am also having her start on amoxicillin-clavulanate and Pseudoephedrine-Guaifenesin. Additionally, I am having her maintain her aspirin, nitroGLYCERIN, fexofenadine, MULTIVITAMIN ADULT, fenofibrate, traZODone, sertraline,  and valsartan-hydrochlorothiazide.  Meds ordered this encounter  Medications  . fenofibrate 160 MG tablet    Sig: TAKE 1 TABLET BY MOUTH  DAILY FOR CHOLESTEROL AND  TRIGLYCERIDES    Dispense:  90 tablet    Refill:  1  . DISCONTD: sertraline (ZOLOFT) 50 MG tablet    Sig: Take 1 tablet (50 mg total) by mouth daily.    Dispense:  90 tablet    Refill:  1  . traZODone (DESYREL) 150 MG tablet    Sig: TAKE 1 OR 2 TABLETS BY  MOUTH AT BEDTIME AS NEEDED  FOR SLEEP    Dispense:  180 tablet    Refill:  1  . DISCONTD: valsartan-hydrochlorothiazide (DIOVAN-HCT) 160-12.5 MG tablet    Sig: Take 1 tablet by mouth daily.    Dispense:  90 tablet    Refill:  1  . sertraline (ZOLOFT) 50 MG tablet    Sig: Take 1 tablet (50 mg total) by mouth daily.    Dispense:  30 tablet    Refill:  0  . valsartan-hydrochlorothiazide (DIOVAN-HCT) 160-12.5 MG tablet    Sig: Take 1 tablet by mouth daily.    Dispense:  30 tablet    Refill:  0  . amoxicillin-clavulanate (AUGMENTIN) 875-125 MG tablet    Sig: Take 1 tablet by mouth 2 (two) times daily. Take all of this medication    Dispense:  20 tablet    Refill:  0  . Pseudoephedrine-Guaifenesin [id number] MG TB12    Sig: Take 1 tablet by mouth 2 (two) times daily. For congestion    Dispense:  20 each    Refill:  0     Follow-up: Return in about 6 months (around 10/10/2018), or if symptoms worsen or fail to improve.  [name], M.D.

## 2018-04-12 ENCOUNTER — Ambulatory Visit: Payer: Medicare Other | Admitting: Physical Therapy

## 2018-04-12 ENCOUNTER — Encounter: Payer: Self-pay | Admitting: Physical Therapy

## 2018-04-12 ENCOUNTER — Ambulatory Visit: Payer: Medicare Other | Admitting: Family Medicine

## 2018-04-12 DIAGNOSIS — R2689 Other abnormalities of gait and mobility: Secondary | ICD-10-CM

## 2018-04-12 DIAGNOSIS — M25661 Stiffness of right knee, not elsewhere classified: Secondary | ICD-10-CM | POA: Diagnosis not present

## 2018-04-12 DIAGNOSIS — M6281 Muscle weakness (generalized): Secondary | ICD-10-CM | POA: Diagnosis not present

## 2018-04-12 DIAGNOSIS — R293 Abnormal posture: Secondary | ICD-10-CM | POA: Diagnosis not present

## 2018-04-12 DIAGNOSIS — R2681 Unsteadiness on feet: Secondary | ICD-10-CM

## 2018-04-12 DIAGNOSIS — M25662 Stiffness of left knee, not elsewhere classified: Secondary | ICD-10-CM | POA: Diagnosis not present

## 2018-04-12 LAB — CMP14+EGFR
ALBUMIN: 3.9 g/dL (ref 3.8–4.8)
ALK PHOS: 52 IU/L (ref 39–117)
ALT: 8 IU/L (ref 0–32)
AST: 16 IU/L (ref 0–40)
Albumin/Globulin Ratio: 1.2 (ref 1.2–2.2)
BUN / CREAT RATIO: 21 (ref 12–28)
BUN: 19 mg/dL (ref 8–27)
Bilirubin Total: 0.3 mg/dL (ref 0.0–1.2)
CALCIUM: 9.5 mg/dL (ref 8.7–10.3)
CO2: 24 mmol/L (ref 20–29)
CREATININE: 0.92 mg/dL (ref 0.57–1.00)
Chloride: 101 mmol/L (ref 96–106)
GFR calc Af Amer: 76 mL/min/{1.73_m2} (ref >59)
GFR, EST NON AFRICAN AMERICAN: 66 mL/min/{1.73_m2} (ref >59)
GLOBULIN, TOTAL: 3.2 g/dL (ref 1.5–4.5)
GLUCOSE: 108 mg/dL — AB (ref 65–99)
Potassium: 3.9 mmol/L (ref 3.5–5.2)
SODIUM: 141 mmol/L (ref 134–144)
Total Protein: 7.1 g/dL (ref 6.0–8.5)

## 2018-04-12 LAB — CBC WITH DIFFERENTIAL/PLATELET
BASOS ABS: 0.1 10*3/uL (ref 0.0–0.2)
Basos: 1 %
EOS (ABSOLUTE): 0.2 10*3/uL (ref 0.0–0.4)
EOS: 2 %
HEMATOCRIT: 40 % (ref 34.0–46.6)
HEMOGLOBIN: 13 g/dL (ref 11.1–15.9)
IMMATURE GRANULOCYTES: 0 %
Immature Grans (Abs): 0 10*3/uL (ref 0.0–0.1)
Lymphocytes Absolute: 3.2 10*3/uL — ABNORMAL HIGH (ref 0.7–3.1)
Lymphs: 30 %
MCH: 27.6 pg (ref 26.6–33.0)
MCHC: 32.5 g/dL (ref 31.5–35.7)
MCV: 85 fL (ref 79–97)
MONOCYTES: 7 %
Monocytes Absolute: 0.8 10*3/uL (ref 0.1–0.9)
Neutrophils Absolute: 6.3 10*3/uL (ref 1.4–7.0)
Neutrophils: 60 %
Platelets: 300 10*3/uL (ref 150–450)
RBC: 4.71 x10E6/uL (ref 3.77–5.28)
RDW: 14.2 % (ref 11.7–15.4)
WBC: 10.6 10*3/uL (ref 3.4–10.8)

## 2018-04-12 NOTE — Progress Notes (Signed)
Hello Sal[place],  Your lab result is normal.Some minor variations that are not significant are commonly marked abnormal, but do not represent any medical problem for you.  Best regards, [name], M.D.

## 2018-04-13 NOTE — Therapy (Signed)
[facility] [address] [place], [place], [address] Phone: [phone]   Fax:  [phone]  Physical Therapy Treatment  Patient Details  Name: Julia West MRN: [id number] Date of Birth: [birth year] Referring Provider (PT): Meridee Score, MD   Encounter Date: 04/12/2018  PT End of Session - 04/12/18 0940    Visit Number  18    Number of Visits  26    Date for PT Re-Evaluation  05/12/18    Authorization Type  Medicare & Medicaid     PT Start Time  0933    PT Stop Time  1013    PT Time Calculation (min)  40 min    Equipment Utilized During Treatment  Gait belt    Activity Tolerance  Patient tolerated treatment well;Patient limited by fatigue;Patient limited by pain    Behavior During Therapy  [facility] for tasks assessed/performed       Past Medical History:  Diagnosis Date  . Acute myocardial infarction    with a ruptured plaque in the circumflex in 2003  . Anxiety   . 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 Complication of anesthesia    states low O2 sats post-op 11/13, always slow to awaken  . Degenerative joint disease   . 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 Dyspnea    with activity- car to house - steps  . HTN (hypertension)   . Hyperlipidemia LDL goal <70   . Internal and external hemorrhoids without complication   . Lymphedema    right side of body  . Lymphedema of arm    right  . Morbid obesity ([place])   . Surgical wound, non healing ABDOMINAL    has wound vac @ 125 mm Hg    Past Surgical History:  Procedure Laterality Date  . AMPUTATION Left 05/06/2017   Procedure: BELOW KNEE AMPUTATION;  Surgeon: Newt Minion, MD;  Location: [facility];  Service: Orthopedics;  Laterality: Left;  . APPLICATION OF WOUND VAC    . BOWEL RESECTION  02/07/2012   Procedure: SMALL BOWEL RESECTION;  Surgeon: Gayland Curry, MD,FACS;  Location: [facility];  Service: General;;  . CARDIOVASCULAR STRESS TEST  01-17-2012   DR HOCHREIN   LOW RISK NUCLEAR TEST  . CESAREAN SECTION     x 4 in remote past  . CHOLECYSTECTOMY    . CORONARY ARTERY BYPASS GRAFT  2003   by Dr. Servando Snare. LIMA to the LAD, free RIMA to the circumflex. Stress perfusion study December 2009 with no high-risk areas of ischemia. She has a well-preserved ejection fraction  . HYSTEROSCOPY W/D&C N/A 05/14/2014   Procedure: DILATATION AND CURETTAGE (no specimen); HYSTEROSCOPY;  Surgeon: Jonnie Kind, MD;  [facility];  Service: Gynecology;  Laterality: N/A;  . I&D EXTREMITY Left 06/21/2017   Procedure: IRRIGATION AND DEBRIDEMENT OF LEFT LEG AMPUTATION SITE;  Surgeon: Wallace Going, DO;  [facility];  Service: Plastics;  Laterality: Left;  . INCISION AND DRAINAGE OF WOUND  04/17/2012   Procedure: IRRIGATION AND DEBRIDEMENT WOUND;  Surgeon: Theodoro Kos, DO;  Location: [facility];  Service: Plastics;  Laterality: N/A;  OF ABDOMINAL WOUND, SURGICAL PREP AND PLACEMENT OF VAC, REMOVAL FOREHEAD SKIN LESION  . INCISION AND DRAINAGE OF WOUND N/A 04/24/2012   Procedure: IRRIGATION AND DEBRIDEMENT WOUND;  Surgeon: Theodoro Kos, DO;  Location: [facility];  Service: Plastics;  Laterality: N/A;  I &  D ABDOMINAL WOUND WITH VAC AND ACELL  . INCISION AND DRAINAGE OF WOUND N/A 05/01/2012   Procedure: IRRIGATION AND DEBRIDEMENT WOUND;  Surgeon: Theodoro Kos, DO;  Location: [facility];  Service: Plastics;  Laterality: N/A;  WITH SURGICAL PREP AND PLACEMENT OF VAC  . INCISION AND DRAINAGE OF WOUND N/A 05/08/2012   Procedure: IRRIGATION AND DEBRIDEMENT OF ABD WOUND SURGICAL PREP AND PLACEMENT OF VAC ;  Surgeon: Theodoro Kos, DO;  Location: [facility];  Service: Plastics;  Laterality: N/A;  IRRIGATION AND DEBRIDEMENT OF ABD WOUND SURGICAL PREP AND PLACEMENT OF VAC   . INCISION AND DRAINAGE OF WOUND N/A 05/15/2012   Procedure: IRRIGATION AND DEBRIDEMENT OF ABDOMINAL WOUND WITH POSSIBLE SURGICAL PREP  AND PLACEMENT OF VAC;  Surgeon: Theodoro Kos, DO;  Location: [facility] [place];  Service: Plastics;  Laterality: N/A;  . INCISION AND DRAINAGE OF WOUND N/A 05/22/2012   Procedure: IRRIGATION AND DEBRIDEMENT OF ABODOMINAL WOUND WITH  SURGICAL PREP AND VAC PLACEMENT;  Surgeon: Theodoro Kos, DO;  Location: [facility];  Service: Plastics;  Laterality: N/A;  . INCISION AND DRAINAGE OF WOUND N/A 06/28/2012   Procedure: IRRIGATION AND DEBRIDEMENT OF ABDOMINAL ULCER SURGICAL PREP AND PLACEMENT OF ACELL AND VAC;  Surgeon: Theodoro Kos, DO;  Location: [facility];  Service: Plastics;  Laterality: N/A;  . INCISIONAL HERNIA REPAIR  02/07/2012   Procedure: HERNIA REPAIR INCISIONAL;  Surgeon: Gayland Curry, MD,FACS;  Location: [facility];  Service: General;;  Open, Primary repair, strangulated Incisional hernia.  Marland Kitchen LESION REMOVAL  04/17/2012   Procedure: LESION REMOVAL;  Surgeon: Theodoro Kos, DO;  Location: [facility];  Service: Plastics;  Laterality: N/A;   CENTER OF FOREHEAD, REMOVAL FORHEAD SKIN LESION  . ORIF TOE FRACTURE Left 02/09/2017   Procedure: EXCISION TALAR HEAD, INTERNAL FIXATION MEDIAL COLUMN LEFT FOOT;  Surgeon: Newt Minion, MD;  Location: [facility];  Service: Orthopedics;  Laterality: Left;  . POLYPECTOMY N/A 05/14/2014   Procedure: ENDOMETRIAL POLYPECTOMY;  Surgeon: Jonnie Kind, MD;  [facility];  Service: Gynecology;  Laterality: N/A;  . STUMP REVISION Left 08/31/2017   Procedure: REVISION LEFT BELOW KNEE AMPUTATION;  Surgeon: Newt Minion, MD;  Location: [facility];  Service: Orthopedics;  Laterality: Left;    There were no vitals filed for this visit.  Subjective Assessment - 04/12/18 0937    Subjective  No new complaints. Saw her primary MD who deferred the knee injection to Dr. Sharol Given who is still following her. She see's him on Monday. Still having issues with sock management for prosthetic fit. Has pretib pads. Recommended she see  [name] for post pad and larger pre tib pads.     Pertinent History  L TTA, bradycardia, depression, lymphedema, DM2, HLD, HTN, CAD, MI, CABG, CA basal cell face, bowel resection,     Limitations  Lifting;Standing;Walking;House hold activities    Currently in Pain?  No/denies           [facility] Adult PT Treatment/Exercise - 04/12/18 0941      Transfers   Transfers  Sit to Stand;Stand to Sit    Sit to Stand  5: Supervision;With armrests;From chair/3-in-1;With upper extremity assist    Stand to Sit  5: Supervision;With upper extremity assist;With armrests;To chair/3-in-1      Ambulation/Gait   Ambulation/Gait  Yes    Ambulation/Gait Assistance  5: Supervision;4: Min guard;4: Min assist   min guard to min assist with bil canes  Ambulation/Gait Assistance Details  long distance with RW/prosthesis; then worked on short distance gait with bil canes with decreased knee pain today vs last session.     Ambulation Distance (Feet)  220 Feet   x1 RW, 35x1, 70 x1 bil canes   Assistive device  Rolling walker;Prosthesis    Gait Pattern  Decreased step length - right;Decreased stance time - left;Right hip hike;Left flexed knee in stance;Right flexed knee in stance;Antalgic;Lateral hip instability;Step-to pattern    Ambulation Surface  Level;Indoor      Prosthetics   Prosthetic Care Comments   pt educated on cut off sock today with decreased pressure reported with use of it on limb.     Current prosthetic wear tolerance (days/week)   daily    Current prosthetic wear tolerance (#hours/day)   all awake hours except 1 hr midday.     Donning Prosthesis  Supervision    Doffing Prosthesis  Supervision           PT Short Term Goals - 04/10/18 2536      PT SHORT TERM GOAL #1   Title  Patient verbalizes adjusting ply socks to adjust limb volume changes. (All updated STGs Target Date: 04/14/2018)    Baseline  04/10/18: met today    Status  Achieved      PT SHORT TERM GOAL #2   Title  Patient  tolerates prosthesis wear >90% of awake hours with PT guidance for skin & limb pain issues.     Baseline  04/10/18: met today    Status  Achieved      PT SHORT TERM GOAL #3   Title  Patient able to stand 2 minutes without UE support with supervision.     Baseline  04/10/18: pt reports she did this 2 sessions ago    Time  --    Period  --    Status  Achieved      PT SHORT TERM GOAL #4   Title  Patient ambulates 200' with RW & prosthesis with supervision.     Baseline  04/10/18: met today    Time  --    Period  --    Status  Achieved      PT SHORT TERM GOAL #5   Title  Patient negotiates ramps & curbs with RW & prosthesis with supervision.     Baseline  04/10/18: met today    Time  --    Period  --    Status  Achieved        PT Long Term Goals - 02/23/18 1135      PT LONG TERM GOAL #1   Title  Patient demonstrates and verbalizes proper prosthetic care to enable safe use of prosthesis (All LTGs Target Date: 05/12/2018)    Time  3    Period  Months    Status  On-going    Target Date  05/12/18      PT LONG TERM GOAL #2   Title  Patient tolerates wear of prosthesis >90% of awake hours without skin or limb pain issues to enable function throughout her day.     Time  3    Period  Months    Status  On-going    Target Date  05/12/18      PT LONG TERM GOAL #3   Title  Lillie Fragmin >36/56    Time  3    Period  Months    Status  On-going    Target Date  05/12/18      PT LONG TERM GOAL #4   Title  Patient ambulates 300' with LRAD & prosthesis modified independent for basic community mobility.     Time  3    Period  Months    Status  On-going    Target Date  05/12/18      PT LONG TERM GOAL #5   Title  Patient negotiates ramps, curbs & stairs with LRAD & prosthesis modified independent for community access.     Time  3    Period  Months    Status  On-going    Target Date  05/12/18            Plan - 04/12/18 0941    Clinical Impression Statement  Today's skilled  session provided education on use of cut off sock for improved fit/comfort with prosthesis. Remainder of session address distance gait with RW and short, house hold distance with bil canes. Less knee pain reported with use of bil canes (with rubber quad tip) with less overall assistance needed for balance. The pt is progressing toward goals and should benefit from continued PT to progress toward unmet goals.     Rehab Potential  Good    PT Frequency  2x / week    PT Duration  Other (comment)   13 weeks (90 days)   PT Treatment/Interventions  ADLs/Self Care Home Management;DME Instruction;Gait training;Stair training;Functional mobility training;Therapeutic activities;Therapeutic exercise;Balance training;Neuromuscular re-education;Patient/family education;Prosthetic Training;Manual techniques;Vestibular    PT Next Visit Plan  begin to work on balance activities, short distance gait with cane/prosthesis- bil canes progressing to single cane as knee pain allows    Consulted and Agree with Plan of Care  Patient;Family member/caregiver    Family Member Consulted  husband who is deaf       Patient will benefit from skilled therapeutic intervention in order to improve the following deficits and impairments:  Abnormal gait, Decreased activity tolerance, Decreased balance, Cardiopulmonary status limiting activity, Decreased coordination, Decreased endurance, Decreased knowledge of use of DME, Decreased mobility, Decreased range of motion, Decreased skin integrity, Decreased strength, Dizziness, Impaired flexibility, Increased edema, Obesity, Prosthetic Dependency, Postural dysfunction  Visit Diagnosis: Other abnormalities of gait and mobility  Unsteadiness on feet  Abnormal posture  Muscle weakness (generalized)     Problem List Patient Active Problem List   Diagnosis Date Noted  . Dyslipidemia 11/23/2017  . Below knee amputation status, left 08/31/2017  . Dehiscence of amputation stump ([place])  08/29/2017  . Infection of amputation stump, left lower extremity ([place]) 06/19/2017  . Bradycardia 05/07/2017  . Depression 05/07/2017  . Lymphedema 05/07/2017  . Charcot foot due to diabetes mellitus ([place]) 02/09/2017  . 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 Post-menopausal bleeding 04/15/2014  . Endometrial polyp 04/09/2014  . Ventral hernia 03/06/2012  . Open abdominal wall wound 03/06/2012  . Hyperlipemia 11/12/2008  . OBESITY, MORBID 11/12/2008  . Essential hypertension 11/12/2008  . CAD (coronary artery disease) 11/12/2008    Willow Ora, PTA, [facility] [address] [place], [place] [address] [phone] 04/13/18, 1:26 PM   Name: Julia West MRN: [id number] Date of Birth: April 10, 1955

## 2018-04-17 ENCOUNTER — Encounter: Payer: Self-pay | Admitting: Physical Therapy

## 2018-04-17 ENCOUNTER — Encounter (INDEPENDENT_AMBULATORY_CARE_PROVIDER_SITE_OTHER): Payer: Self-pay | Admitting: Orthopedic Surgery

## 2018-04-17 ENCOUNTER — Ambulatory Visit: Payer: Medicare Other | Attending: Family Medicine | Admitting: Physical Therapy

## 2018-04-17 ENCOUNTER — Ambulatory Visit (INDEPENDENT_AMBULATORY_CARE_PROVIDER_SITE_OTHER): Payer: Medicare Other | Admitting: Orthopedic Surgery

## 2018-04-17 DIAGNOSIS — R2681 Unsteadiness on feet: Secondary | ICD-10-CM | POA: Diagnosis not present

## 2018-04-17 DIAGNOSIS — M25661 Stiffness of right knee, not elsewhere classified: Secondary | ICD-10-CM

## 2018-04-17 DIAGNOSIS — Z89512 Acquired absence of left leg below knee: Secondary | ICD-10-CM | POA: Diagnosis not present

## 2018-04-17 DIAGNOSIS — M1711 Unilateral primary osteoarthritis, right knee: Secondary | ICD-10-CM | POA: Diagnosis not present

## 2018-04-17 DIAGNOSIS — M6281 Muscle weakness (generalized): Secondary | ICD-10-CM | POA: Insufficient documentation

## 2018-04-17 DIAGNOSIS — Z993 Dependence on wheelchair: Secondary | ICD-10-CM | POA: Diagnosis not present

## 2018-04-17 DIAGNOSIS — R2689 Other abnormalities of gait and mobility: Secondary | ICD-10-CM

## 2018-04-17 DIAGNOSIS — M25662 Stiffness of left knee, not elsewhere classified: Secondary | ICD-10-CM

## 2018-04-17 DIAGNOSIS — R293 Abnormal posture: Secondary | ICD-10-CM | POA: Insufficient documentation

## 2018-04-17 MED ORDER — LIDOCAINE HCL 1 % IJ SOLN
5.0000 mL | INTRAMUSCULAR | Status: AC | PRN
  Administered 2018-04-17: 5 mL

## 2018-04-17 MED ORDER — METHYLPREDNISOLONE ACETATE 40 MG/ML IJ SUSP
40.0000 mg | INTRAMUSCULAR | Status: AC | PRN
  Administered 2018-04-17: 40 mg via INTRA_ARTICULAR

## 2018-04-17 NOTE — Progress Notes (Signed)
Office Visit Note   Patient: Julia West           Date of Birth: 05/11/1955           MRN: [id number] Visit Date: 04/17/2018              Requested by: [name], MD [place], [place] [address] PCP: [name], MD  Chief Complaint  Patient presents with  . Left Leg - Follow-up      HPI: Patient is a 63 year old woman who presents with 2 separate issues #1 she is status post left transtibial amputation #2 osteoarthritis of the right knee.  Patient complains of pain with weightbearing crepitation with range of motion.  Assessment & Plan: Visit Diagnoses:  1. Acquired absence of left lower extremity below knee ([place])   2. Unilateral primary osteoarthritis, right knee     Plan: The right knee was injected without complications.  Patient will follow-up as needed.  She will continue with therapy for the left transtibial amputation.  Follow-Up Instructions: Return if symptoms worsen or fail to improve.   Ortho Exam  Patient is alert, oriented, no adenopathy, well-dressed, normal affect, normal respiratory effort. Examination patient is ambulating a wheelchair.  Her left lower extremity has healed well no complicating features no cellulitis no open wounds.  She does have a very mobile soft tissue envelope.  Examination of right knee there is crepitation with range of motion tenderness to palpation collaterals or cruciates are stable no redness no cellulitis no effusion.  Imaging: No results found. No images are attached to the encounter.  Labs: Lab Results  Component Value Date   HGBA1C 5.3 08/24/2017   HGBA1C 5.4 05/09/2017   HGBA1C 5.6 02/07/2017   ESRSEDRATE 84 (H) 09/05/2017   CRP 7.9 (H) 09/05/2017   REPTSTATUS 09/10/2017 FINAL 09/05/2017   GRAMSTAIN  04/24/2012    NO WBC SEEN NO SQUAMOUS EPITHELIAL CELLS SEEN MODERATE GRAM POSITIVE COCCI IN PAIRS RARE GRAM NEGATIVE RODS   GRAMSTAIN  04/24/2012    NO WBC SEEN NO SQUAMOUS EPITHELIAL CELLS  SEEN MODERATE GRAM POSITIVE COCCI IN PAIRS RARE GRAM NEGATIVE RODS   CULT  09/05/2017    NO GROWTH 5 DAYS Performed at [facility], [place] [address] [place], [place] [address]    LABORGA PROTEUS MIRABILIS 04/24/2012     Lab Results  Component Value Date   ALBUMIN 3.9 04/11/2018   ALBUMIN 4.0 08/24/2017   ALBUMIN 4.6 12/10/2016    There is no height or weight on file to calculate BMI.  Orders:  No orders of the defined types were placed in this encounter.  No orders of the defined types were placed in this encounter.    Procedures: Large Joint Inj: R knee on 04/17/2018 5:47 PM Indications: pain and diagnostic evaluation Details: 22 G 1.5 in needle, anteromedial approach  Arthrogram: No  Medications: 5 mL lidocaine 1 %; 40 mg methylPREDNISolone acetate 40 MG/ML Outcome: tolerated well, no immediate complications Procedure, treatment alternatives, risks and benefits explained, specific risks discussed. Consent was given by the patient. Immediately prior to procedure a time out was called to verify the correct patient, procedure, equipment, support staff and site/side marked as required. Patient was prepped and draped in the usual sterile fashion.      Clinical Data: No additional findings.  ROS:  All other systems negative, except as noted in the HPI. Review of Systems  Objective: Vital Signs: There were no vitals taken for  this visit.  Specialty Comments:  No specialty comments available.  PMFS History: Patient Active Problem List   Diagnosis Date Noted  . Dyslipidemia 11/23/2017  . Below knee amputation status, left 08/31/2017  . Dehiscence of amputation stump ([place]) 08/29/2017  . Infection of amputation stump, left lower extremity ([place]) 06/19/2017  . Bradycardia 05/07/2017  . Depression 05/07/2017  . Lymphedema 05/07/2017  . Charcot foot due to diabetes mellitus ([place]) 02/09/2017  . 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 Post-menopausal bleeding 04/15/2014  . Endometrial polyp 04/09/2014  . Ventral hernia 03/06/2012  . Open abdominal wall wound 03/06/2012  . Hyperlipemia 11/12/2008  . OBESITY, MORBID 11/12/2008  . Essential hypertension 11/12/2008  . CAD (coronary artery disease) 11/12/2008   Past Medical History:  Diagnosis Date  . Acute myocardial infarction    with a ruptured plaque in the circumflex in 2003  . Anxiety   . Cancer ([place])    basal cell face  . Complication of anesthesia    states low O2 sats post-op 11/13, always slow to awaken  . Degenerative joint disease   . 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 Dyspnea    with activity- car to house - steps  . HTN (hypertension)   . Hyperlipidemia LDL goal <70   . Internal and external hemorrhoids without complication   . Lymphedema    right side of body  . Lymphedema of arm    right  . Morbid obesity ([place])   . Surgical wound, non healing ABDOMINAL    has wound vac @ 125 mm Hg    Family History  Problem Relation Age of Onset  . Heart attack Mother   . Hypertension Sister   . Diabetes Brother   . Birth defects Son        heart defect    Past Surgical History:  Procedure Laterality Date  . AMPUTATION Left 05/06/2017   Procedure: BELOW KNEE AMPUTATION;  Surgeon: Newt Minion, MD;  Location: [facility];  Service: Orthopedics;  Laterality: Left;  . APPLICATION OF WOUND VAC    . BOWEL RESECTION  02/07/2012   Procedure: SMALL BOWEL RESECTION;  Surgeon: Gayland Curry, MD,FACS;  Location: [facility];  Service: General;;  . CARDIOVASCULAR STRESS TEST  01-17-2012  DR HOCHREIN   LOW RISK NUCLEAR TEST  . CESAREAN SECTION     x 4 in remote past  . CHOLECYSTECTOMY    . CORONARY ARTERY BYPASS GRAFT  2003   by Dr. Servando Snare. LIMA to the LAD, free RIMA to the circumflex. Stress perfusion study December 2009 with no high-risk areas of ischemia. She has a well-preserved ejection fraction  . HYSTEROSCOPY W/D&C N/A 05/14/2014   Procedure:  DILATATION AND CURETTAGE (no specimen); HYSTEROSCOPY;  Surgeon: Jonnie Kind, MD;  [facility];  Service: Gynecology;  Laterality: N/A;  . I&D EXTREMITY Left 06/21/2017   Procedure: IRRIGATION AND DEBRIDEMENT OF LEFT LEG AMPUTATION SITE;  Surgeon: Wallace Going, DO;  [facility];  Service: Plastics;  Laterality: Left;  . INCISION AND DRAINAGE OF WOUND  04/17/2012   Procedure: IRRIGATION AND DEBRIDEMENT WOUND;  Surgeon: Theodoro Kos, DO;  Location: [place] [facility];  Service: Plastics;  Laterality: N/A;  OF ABDOMINAL WOUND, SURGICAL PREP AND PLACEMENT OF VAC, REMOVAL FOREHEAD SKIN LESION  . INCISION AND DRAINAGE OF WOUND N/A 04/24/2012   Procedure: IRRIGATION AND DEBRIDEMENT WOUND;  Surgeon: Lyndee Leo  Sanger, DO;  Location: [facility];  Service: Clinical cytogeneticist;  Laterality: N/A;  I & D ABDOMINAL WOUND WITH VAC AND ACELL  . INCISION AND DRAINAGE OF WOUND N/A 05/01/2012   Procedure: IRRIGATION AND DEBRIDEMENT WOUND;  Surgeon: Theodoro Kos, DO;  Location: [facility];  Service: Plastics;  Laterality: N/A;  WITH SURGICAL PREP AND PLACEMENT OF VAC  . INCISION AND DRAINAGE OF WOUND N/A 05/08/2012   Procedure: IRRIGATION AND DEBRIDEMENT OF ABD WOUND SURGICAL PREP AND PLACEMENT OF VAC ;  Surgeon: Theodoro Kos, DO;  Location: [facility];  Service: Plastics;  Laterality: N/A;  IRRIGATION AND DEBRIDEMENT OF ABD WOUND SURGICAL PREP AND PLACEMENT OF VAC   . INCISION AND DRAINAGE OF WOUND N/A 05/15/2012   Procedure: IRRIGATION AND DEBRIDEMENT OF ABDOMINAL WOUND WITH POSSIBLE SURGICAL PREP AND PLACEMENT OF VAC;  Surgeon: Theodoro Kos, DO;  Location: [place] [facility];  Service: Plastics;  Laterality: N/A;  . INCISION AND DRAINAGE OF WOUND N/A 05/22/2012   Procedure: IRRIGATION AND DEBRIDEMENT OF ABODOMINAL WOUND WITH  SURGICAL PREP AND VAC PLACEMENT;  Surgeon: Theodoro Kos, DO;  Location: [facility];  Service: Plastics;   Laterality: N/A;  . INCISION AND DRAINAGE OF WOUND N/A 06/28/2012   Procedure: IRRIGATION AND DEBRIDEMENT OF ABDOMINAL ULCER SURGICAL PREP AND PLACEMENT OF ACELL AND VAC;  Surgeon: Theodoro Kos, DO;  Location: [facility];  Service: Plastics;  Laterality: N/A;  . INCISIONAL HERNIA REPAIR  02/07/2012   Procedure: HERNIA REPAIR INCISIONAL;  Surgeon: Gayland Curry, MD,FACS;  Location: [facility];  Service: General;;  Open, Primary repair, strangulated Incisional hernia.  Marland Kitchen LESION REMOVAL  04/17/2012   Procedure: LESION REMOVAL;  Surgeon: Theodoro Kos, DO;  Location: [facility];  Service: Plastics;  Laterality: N/A;   CENTER OF FOREHEAD, REMOVAL FORHEAD SKIN LESION  . ORIF TOE FRACTURE Left 02/09/2017   Procedure: EXCISION TALAR HEAD, INTERNAL FIXATION MEDIAL COLUMN LEFT FOOT;  Surgeon: Newt Minion, MD;  Location: [facility];  Service: Orthopedics;  Laterality: Left;  . POLYPECTOMY N/A 05/14/2014   Procedure: ENDOMETRIAL POLYPECTOMY;  Surgeon: Jonnie Kind, MD;  Location[facility];  Service: Gynecology;  Laterality: N/A;  . STUMP REVISION Left 08/31/2017   Procedure: REVISION LEFT BELOW KNEE AMPUTATION;  Surgeon: Newt Minion, MD;  Location: [place];  Service: Orthopedics;  Laterality: Left;   Social History   Occupational History  . Occupation: Estate manager/land agent    Comment: In the [employer]  Tobacco Use  . Smoking status: Former Smoker    Packs/day: 1.00    Years: 25.00    Pack years: 25.00    Types: Cigarettes    Last attempt to quit: 11/17/2011    Years since quitting: 6.4  . Smokeless tobacco: Never Used  Substance and Sexual Activity  . Alcohol use: No  . Drug use: No  . Sexual activity: Yes    Birth control/protection: Post-menopausal

## 2018-04-18 ENCOUNTER — Encounter: Payer: Self-pay | Admitting: Family Medicine

## 2018-04-18 NOTE — Therapy (Signed)
[facility] [address] [place], [place], [address] Phone: [phone]   Fax:  [phone]  Physical Therapy Treatment  Patient Details  Name: Julia West MRN: [id number] Date of Birth: [birth year] Referring Provider (PT): Meridee Score, MD   Encounter Date: 04/17/2018  PT End of Session - 04/17/18 1800    Visit Number  19    Number of Visits  26    Date for PT Re-Evaluation  05/12/18    Authorization Type  Medicare & Medicaid     PT Start Time  0930    PT Stop Time  9233    PT Time Calculation (min)  45 min    Equipment Utilized During Treatment  Gait belt    Activity Tolerance  Patient tolerated treatment well;Patient limited by fatigue;Patient limited by pain    Behavior During Therapy  [facility] for tasks assessed/performed       Past Medical History:  Diagnosis Date  . Acute myocardial infarction    with a ruptured plaque in the circumflex in 2003  . Anxiety   . Cancer ([place])    basal cell face  . Complication of anesthesia    states low O2 sats post-op 11/13, always slow to awaken  . Degenerative joint disease   . 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 Dyspnea    with activity- car to house - steps  . HTN (hypertension)   . Hyperlipidemia LDL goal <70   . Internal and external hemorrhoids without complication   . Lymphedema    right side of body  . Lymphedema of arm    right  . Morbid obesity ([place])   . Surgical wound, non healing ABDOMINAL    has wound vac @ 125 mm Hg    Past Surgical History:  Procedure Laterality Date  . AMPUTATION Left 05/06/2017   Procedure: BELOW KNEE AMPUTATION;  Surgeon: Newt Minion, MD;  Location: [facility];  Service: Orthopedics;  Laterality: Left;  . APPLICATION OF WOUND VAC    . BOWEL RESECTION  02/07/2012   Procedure: SMALL BOWEL RESECTION;  Surgeon: Gayland Curry, MD,FACS;  Location: [facility];  Service: General;;  . CARDIOVASCULAR STRESS TEST  01-17-2012   DR HOCHREIN   LOW RISK NUCLEAR TEST  . CESAREAN SECTION     x 4 in remote past  . CHOLECYSTECTOMY    . CORONARY ARTERY BYPASS GRAFT  2003   by Dr. Servando Snare. LIMA to the LAD, free RIMA to the circumflex. Stress perfusion study December 2009 with no high-risk areas of ischemia. She has a well-preserved ejection fraction  . HYSTEROSCOPY W/D&C N/A 05/14/2014   Procedure: DILATATION AND CURETTAGE (no specimen); HYSTEROSCOPY;  Surgeon: Jonnie Kind, MD;  Location: [facility];  Service: Gynecology;  Laterality: N/A;  . I&D EXTREMITY Left 06/21/2017   Procedure: IRRIGATION AND DEBRIDEMENT OF LEFT LEG AMPUTATION SITE;  Surgeon: Wallace Going, DO;  Location: [facility];  Service: Plastics;  Laterality: Left;  . INCISION AND DRAINAGE OF WOUND  04/17/2012   Procedure: IRRIGATION AND DEBRIDEMENT WOUND;  Surgeon: Theodoro Kos, DO;  Location: [facility];  Service: Plastics;  Laterality: N/A;  OF ABDOMINAL WOUND, SURGICAL PREP AND PLACEMENT OF VAC, REMOVAL FOREHEAD SKIN LESION  . INCISION AND DRAINAGE OF WOUND N/A 04/24/2012   Procedure: IRRIGATION AND DEBRIDEMENT WOUND;  Surgeon: Theodoro Kos, DO;  Location: [facility];  Service: Plastics;  Laterality: N/A;  I &  D ABDOMINAL WOUND WITH VAC AND ACELL  . INCISION AND DRAINAGE OF WOUND N/A 05/01/2012   Procedure: IRRIGATION AND DEBRIDEMENT WOUND;  Surgeon: Theodoro Kos, DO;  Location: [facility];  Service: Plastics;  Laterality: N/A;  WITH SURGICAL PREP AND PLACEMENT OF VAC  . INCISION AND DRAINAGE OF WOUND N/A 05/08/2012   Procedure: IRRIGATION AND DEBRIDEMENT OF ABD WOUND SURGICAL PREP AND PLACEMENT OF VAC ;  Surgeon: Theodoro Kos, DO;  Location: [facility];  Service: Plastics;  Laterality: N/A;  IRRIGATION AND DEBRIDEMENT OF ABD WOUND SURGICAL PREP AND PLACEMENT OF VAC   . INCISION AND DRAINAGE OF WOUND N/A 05/15/2012   Procedure: IRRIGATION AND DEBRIDEMENT OF ABDOMINAL WOUND WITH POSSIBLE SURGICAL PREP  AND PLACEMENT OF VAC;  Surgeon: Theodoro Kos, DO;  Location: [facility];  Service: Plastics;  Laterality: N/A;  . INCISION AND DRAINAGE OF WOUND N/A 05/22/2012   Procedure: IRRIGATION AND DEBRIDEMENT OF ABODOMINAL WOUND WITH  SURGICAL PREP AND VAC PLACEMENT;  Surgeon: Theodoro Kos, DO;  Location: [facility];  Service: Plastics;  Laterality: N/A;  . INCISION AND DRAINAGE OF WOUND N/A 06/28/2012   Procedure: IRRIGATION AND DEBRIDEMENT OF ABDOMINAL ULCER SURGICAL PREP AND PLACEMENT OF ACELL AND VAC;  Surgeon: Theodoro Kos, DO;  Location: [facility];  Service: Plastics;  Laterality: N/A;  . INCISIONAL HERNIA REPAIR  02/07/2012   Procedure: HERNIA REPAIR INCISIONAL;  Surgeon: Gayland Curry, MD,FACS;  Location: [facility];  Service: General;;  Open, Primary repair, strangulated Incisional hernia.  Marland Kitchen LESION REMOVAL  04/17/2012   Procedure: LESION REMOVAL;  Surgeon: Theodoro Kos, DO;  Location: [facility];  Service: Plastics;  Laterality: N/A;   CENTER OF FOREHEAD, REMOVAL FORHEAD SKIN LESION  . ORIF TOE FRACTURE Left 02/09/2017   Procedure: EXCISION TALAR HEAD, INTERNAL FIXATION MEDIAL COLUMN LEFT FOOT;  Surgeon: Newt Minion, MD;  Location: [facility];  Service: Orthopedics;  Laterality: Left;  . POLYPECTOMY N/A 05/14/2014   Procedure: ENDOMETRIAL POLYPECTOMY;  Surgeon: Jonnie Kind, MD;  Location: [facility];  Service: Gynecology;  Laterality: N/A;  . STUMP REVISION Left 08/31/2017   Procedure: REVISION LEFT BELOW KNEE AMPUTATION;  Surgeon: Newt Minion, MD;  Location: [facility];  Service: Orthopedics;  Laterality: Left;    There were no vitals filed for this visit.  Subjective Assessment - 04/17/18 0933    Subjective  She saw MD & has ear infection. 4-5 days of antibiodic has improved dizziness. Wearing prosthesis from arising to within 1 hr of bedtime & drying as needed.     Pertinent History  L TTA, bradycardia, depression, lymphedema, DM2, HLD,  HTN, CAD, MI, CABG, CA basal cell face, bowel resection,     Limitations  Lifting;Standing;Walking;House hold activities    Patient Stated Goals  To use prosthesis to get in/out cars, bathrooms, access community & home    Currently in Pain?  No/denies                       [facility] Adult PT Treatment/Exercise - 04/17/18 0930      Transfers   Transfers  Sit to Stand;Stand to Sit    Sit to Stand  5: Supervision;With armrests;From chair/3-in-1;With upper extremity assist   to cane   Stand to Sit  5: Supervision;With upper extremity assist;With armrests;To chair/3-in-1   from cane     Ambulation/Gait   Ambulation/Gait  Yes    Ambulation/Gait Assistance  4: Min assist;5: Supervision   MinA hand hold assist cane, supervision RW   Ambulation/Gait Assistance Details  verbal & tactile cues on upright posture & glancing not staring at floor;  goal is household gait with single cane to enable free hand to carry items;  RW working on fluent gait with slow consistent RW movement & upright posture    Ambulation Distance (Feet)  220 Feet   25' X 3 cane/HHA, 220' RW   Assistive device  Rolling walker;Prosthesis;Straight cane   straight cane quad cane   Gait Pattern  Decreased step length - right;Decreased stance time - left;Right hip hike;Left flexed knee in stance;Right flexed knee in stance;Antalgic;Lateral hip instability;Step-to pattern    Ambulation Surface  Indoor;Level      Prosthetics   Prosthetic Care Comments   --    Current prosthetic wear tolerance (days/week)   daily    Current prosthetic wear tolerance (#hours/day)   all awake hours except 1 hr midday.                PT Short Term Goals - 04/10/18 0939      PT SHORT TERM GOAL #1   Title  Patient verbalizes adjusting ply socks to adjust limb volume changes. (All updated STGs Target Date: 04/14/2018)    Baseline  04/10/18: met today    Status  Achieved      PT SHORT TERM GOAL #2   Title  Patient tolerates  prosthesis wear >90% of awake hours with PT guidance for skin & limb pain issues.     Baseline  04/10/18: met today    Status  Achieved      PT SHORT TERM GOAL #3   Title  Patient able to stand 2 minutes without UE support with supervision.     Baseline  04/10/18: pt reports she did this 2 sessions ago    Time  --    Period  --    Status  Achieved      PT SHORT TERM GOAL #4   Title  Patient ambulates 200' with RW & prosthesis with supervision.     Baseline  04/10/18: met today    Time  --    Period  --    Status  Achieved      PT SHORT TERM GOAL #5   Title  Patient negotiates ramps & curbs with RW & prosthesis with supervision.     Baseline  04/10/18: met today    Time  --    Period  --    Status  Achieved        PT Long Term Goals - 02/23/18 1135      PT LONG TERM GOAL #1   Title  Patient demonstrates and verbalizes proper prosthetic care to enable safe use of prosthesis (All LTGs Target Date: 05/12/2018)    Time  3    Period  Months    Status  On-going    Target Date  05/12/18      PT LONG TERM GOAL #2   Title  Patient tolerates wear of prosthesis >90% of awake hours without skin or limb pain issues to enable function throughout her day.     Time  3    Period  Months    Status  On-going    Target Date  05/12/18      PT LONG TERM GOAL #3   Title  Lillie Fragmin >36/56    Time  3    Period  Months    Status  On-going    Target Date  05/12/18      PT LONG TERM GOAL #4   Title  Patient ambulates 300' with LRAD & prosthesis modified independent for basic community mobility.     Time  3    Period  Months    Status  On-going    Target Date  05/12/18      PT LONG TERM GOAL #5   Title  Patient negotiates ramps, curbs & stairs with LRAD & prosthesis modified independent for community access.     Time  3    Period  Months    Status  On-going    Target Date  05/12/18            Plan - 04/17/18 1800    Clinical Impression Statement  Today's skilled session  worked on gait with single cane & hand hold assist limiting distance to control knee pain with cues on weight shift over prosthesis in stance & upright posture. Also worked on gait with RW with upright posture & fluent movement of RW.     Rehab Potential  Good    PT Frequency  2x / week    PT Duration  Other (comment)   13 weeks (90 days)   PT Treatment/Interventions  ADLs/Self Care Home Management;DME Instruction;Gait training;Stair training;Functional mobility training;Therapeutic activities;Therapeutic exercise;Balance training;Neuromuscular re-education;Patient/family education;Prosthetic Training;Manual techniques;Vestibular    PT Next Visit Plan  begin to work on balance activities, short distance gait with cane/HHA/prosthesis limiting distance for pain management    Consulted and Agree with Plan of Care  Patient;Family member/caregiver    Family Member Consulted  husband who is deaf       Patient will benefit from skilled therapeutic intervention in order to improve the following deficits and impairments:  Abnormal gait, Decreased activity tolerance, Decreased balance, Cardiopulmonary status limiting activity, Decreased coordination, Decreased endurance, Decreased knowledge of use of DME, Decreased mobility, Decreased range of motion, Decreased skin integrity, Decreased strength, Dizziness, Impaired flexibility, Increased edema, Obesity, Prosthetic Dependency, Postural dysfunction  Visit Diagnosis: Other abnormalities of gait and mobility  Unsteadiness on feet  Abnormal posture  Muscle weakness (generalized)  Stiffness of left knee, not elsewhere classified  Stiffness of right knee, not elsewhere classified     Problem List Patient Active Problem List   Diagnosis Date Noted  . Dyslipidemia 11/23/2017  . Below knee amputation status, left 08/31/2017  . Dehiscence of amputation stump ([place]) 08/29/2017  . Infection of amputation stump, left lower extremity ([place]) 06/19/2017  .  Bradycardia 05/07/2017  . Depression 05/07/2017  . Lymphedema 05/07/2017  . Charcot foot due to diabetes mellitus ([place]) 02/09/2017  . 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 Post-menopausal bleeding 04/15/2014  . Endometrial polyp 04/09/2014  . Ventral hernia 03/06/2012  . Open abdominal wall wound 03/06/2012  . Hyperlipemia 11/12/2008  . OBESITY, MORBID 11/12/2008  . Essential hypertension 11/12/2008  . CAD (coronary artery disease) 11/12/2008    Jamey Reas PT, DPT 04/18/2018, 6:07 AM  [facility] [address] [place], [place], [address] Phone: [phone]   Fax:  [phone]  Name: Julia West MRN: [id number] Date of Birth: 1955/05/07

## 2018-04-20 ENCOUNTER — Ambulatory Visit: Payer: Medicare Other | Admitting: Physical Therapy

## 2018-04-20 ENCOUNTER — Encounter: Payer: Self-pay | Admitting: Physical Therapy

## 2018-04-20 DIAGNOSIS — R2681 Unsteadiness on feet: Secondary | ICD-10-CM | POA: Diagnosis not present

## 2018-04-20 DIAGNOSIS — R293 Abnormal posture: Secondary | ICD-10-CM

## 2018-04-20 DIAGNOSIS — M25661 Stiffness of right knee, not elsewhere classified: Secondary | ICD-10-CM | POA: Diagnosis not present

## 2018-04-20 DIAGNOSIS — M6281 Muscle weakness (generalized): Secondary | ICD-10-CM

## 2018-04-20 DIAGNOSIS — M25662 Stiffness of left knee, not elsewhere classified: Secondary | ICD-10-CM | POA: Diagnosis not present

## 2018-04-20 DIAGNOSIS — R2689 Other abnormalities of gait and mobility: Secondary | ICD-10-CM

## 2018-04-21 NOTE — Therapy (Addendum)
[facility] [address] [place], [place], [address] Phone: [phone]   Fax:  [phone]  Physical Therapy Treatment  Patient Details  Name: Julia West MRN: [id number] Date of Birth: 1955/09/10 Referring Provider (PT): Meridee Score, MD  Progress Note Reporting Period 03/20/2018 to 04/20/2018  See note below for Objective Data and Assessment of Progress/Goals.   Jamey Reas, PT, DPT PT Specializing in [place] 04/27/18 7:05 AM Phone:  [phone]  Fax:  [phone] [facility] [address] [place], [place] [address]    Encounter Date: 04/20/2018  PT End of Session - 04/20/18 2355    Visit Number  20    Number of Visits  26    Date for PT Re-Evaluation  05/12/18    Authorization Type  Medicare & Medicaid     PT Start Time  0933    PT Stop Time  1015    PT Time Calculation (min)  42 min    Equipment Utilized During Treatment  Gait belt    Activity Tolerance  Patient tolerated treatment well;Patient limited by fatigue;No increased pain    Behavior During Therapy  WFL for tasks assessed/performed       Past Medical History:  Diagnosis Date  . Acute myocardial infarction    with a ruptured plaque in the circumflex in 2003  . Anxiety   . Cancer ([facility])    basal cell face  . Complication of anesthesia    states low O2 sats post-op 11/13, always slow to awaken  . Degenerative joint disease   . 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 Dyspnea    with activity- car to house - steps  . HTN (hypertension)   . Hyperlipidemia LDL goal <70   . Internal and external hemorrhoids without complication   . Lymphedema    right side of body  . Lymphedema of arm    right  . Morbid obesity ([facility] [facility])   . Surgical wound, non healing ABDOMINAL    has wound vac @ 125 mm Hg    Past Surgical History:  Procedure Laterality Date  . AMPUTATION Left 05/06/2017    Procedure: BELOW KNEE AMPUTATION;  Surgeon: Newt Minion, MD;  Location: [facility];  Service: Orthopedics;  Laterality: Left;  . APPLICATION OF WOUND VAC    . BOWEL RESECTION  02/07/2012   Procedure: SMALL BOWEL RESECTION;  Surgeon: Gayland Curry, MD,FACS;  Location: [facility];  Service: General;;  . CARDIOVASCULAR STRESS TEST  01-17-2012  DR HOCHREIN   LOW RISK NUCLEAR TEST  . CESAREAN SECTION     x 4 in remote past  . CHOLECYSTECTOMY    . CORONARY ARTERY BYPASS GRAFT  2003   by Dr. Servando Snare. LIMA to the LAD, free RIMA to the circumflex. Stress perfusion study December 2009 with no high-risk areas of ischemia. She has a well-preserved ejection fraction  . HYSTEROSCOPY W/D&C N/A 05/14/2014   Procedure: DILATATION AND CURETTAGE (no specimen); HYSTEROSCOPY;  Surgeon: Jonnie Kind, MD;  Location: [facility];  Service: Gynecology;  Laterality: N/A;  . I&D EXTREMITY Left 06/21/2017   Procedure: IRRIGATION AND DEBRIDEMENT OF LEFT LEG AMPUTATION SITE;  Surgeon: Wallace Going, DO;  Location: [facility];  Service: Plastics;  Laterality: Left;  . INCISION AND DRAINAGE OF WOUND  04/17/2012   Procedure: IRRIGATION AND DEBRIDEMENT WOUND;  Surgeon: Theodoro Kos, DO;  Location: [facility];  Service: Clinical cytogeneticist;  Laterality: N/A;  OF ABDOMINAL WOUND, SURGICAL PREP AND PLACEMENT OF VAC, REMOVAL FOREHEAD SKIN LESION  . INCISION AND DRAINAGE OF WOUND N/A 04/24/2012   Procedure: IRRIGATION AND DEBRIDEMENT WOUND;  Surgeon: Theodoro Kos, DO;  Location: [facility];  Service: Plastics;  Laterality: N/A;  I & D ABDOMINAL WOUND WITH VAC AND ACELL  . INCISION AND DRAINAGE OF WOUND N/A 05/01/2012   Procedure: IRRIGATION AND DEBRIDEMENT WOUND;  Surgeon: Theodoro Kos, DO;  Location: [facility];  Service: Plastics;  Laterality: N/A;  WITH SURGICAL PREP AND PLACEMENT OF VAC  . INCISION AND DRAINAGE OF WOUND N/A 05/08/2012   Procedure: IRRIGATION AND DEBRIDEMENT OF ABD WOUND SURGICAL PREP  AND PLACEMENT OF VAC ;  Surgeon: Theodoro Kos, DO;  Location: [facility];  Service: Plastics;  Laterality: N/A;  IRRIGATION AND DEBRIDEMENT OF ABD WOUND SURGICAL PREP AND PLACEMENT OF VAC   . INCISION AND DRAINAGE OF WOUND N/A 05/15/2012   Procedure: IRRIGATION AND DEBRIDEMENT OF ABDOMINAL WOUND WITH POSSIBLE SURGICAL PREP AND PLACEMENT OF VAC;  Surgeon: Theodoro Kos, DO;  Location: [facility];  Service: Plastics;  Laterality: N/A;  . INCISION AND DRAINAGE OF WOUND N/A 05/22/2012   Procedure: IRRIGATION AND DEBRIDEMENT OF ABODOMINAL WOUND WITH  SURGICAL PREP AND VAC PLACEMENT;  Surgeon: Theodoro Kos, DO;  Location: [facility];  Service: Plastics;  Laterality: N/A;  . INCISION AND DRAINAGE OF WOUND N/A 06/28/2012   Procedure: IRRIGATION AND DEBRIDEMENT OF ABDOMINAL ULCER SURGICAL PREP AND PLACEMENT OF ACELL AND VAC;  Surgeon: Theodoro Kos, DO;  Location: [facility];  Service: Plastics;  Laterality: N/A;  . INCISIONAL HERNIA REPAIR  02/07/2012   Procedure: HERNIA REPAIR INCISIONAL;  Surgeon: Gayland Curry, MD,FACS;  Location: [facility];  Service: General;;  Open, Primary repair, strangulated Incisional hernia.  Marland Kitchen LESION REMOVAL  04/17/2012   Procedure: LESION REMOVAL;  Surgeon: Theodoro Kos, DO;  Location: [facility];  Service: Plastics;  Laterality: N/A;   CENTER OF FOREHEAD, REMOVAL FORHEAD SKIN LESION  . ORIF TOE FRACTURE Left 02/09/2017   Procedure: EXCISION TALAR HEAD, INTERNAL FIXATION MEDIAL COLUMN LEFT FOOT;  Surgeon: Newt Minion, MD;  Location: [facility];  Service: Orthopedics;  Laterality: Left;  . POLYPECTOMY N/A 05/14/2014   Procedure: ENDOMETRIAL POLYPECTOMY;  Surgeon: Jonnie Kind, MD;  Location: [facility];  Service: Gynecology;  Laterality: N/A;  . STUMP REVISION Left 08/31/2017   Procedure: REVISION LEFT BELOW KNEE AMPUTATION;  Surgeon: Newt Minion, MD;  Location: [facility];  Service: Orthopedics;  Laterality: Left;     There were no vitals filed for this visit.  Subjective Assessment - 04/20/18 0936    Subjective  No new complaints. Had a knee injection by Dr. Sharol Given, feeling better since having the injection. Was released by him unless she has a need to follow up. No falls.     Patient is accompained by:  Family member    Pertinent History  L TTA, bradycardia, depression, lymphedema, DM2, HLD, HTN, CAD, MI, CABG, CA basal cell face, bowel resection,     Limitations  Lifting;Standing;Walking;House hold activities    Patient Stated Goals  To use prosthesis to get in/out cars, bathrooms, access community & home    Currently in Pain?  No/denies            [facility] Adult PT Treatment/Exercise - 04/20/18 7510      Transfers   Transfers  Sit to Stand;Stand to Sit    Sit to Stand  5: Supervision;With armrests;From chair/3-in-1;With upper extremity assist    Stand to Sit  5: Supervision;With upper extremity assist;With armrests;To chair/3-in-1      Ambulation/Gait   Ambulation/Gait  Yes    Ambulation/Gait Assistance  5: Supervision;4: Min guard;4: Min assist    Ambulation/Gait Assistance Details  cues on posture, walker position with gait; cues on posure, step placement and weight shifting with cane with HHA opposite the cane.     Ambulation Distance (Feet)  250 Feet   x 2 RW; 25 x4 with cane   Assistive device  Rolling walker;Prosthesis;Straight cane;1 person hand held assist    Gait Pattern  Decreased step length - right;Decreased stance time - left;Right hip hike;Left flexed knee in stance;Right flexed knee in stance;Antalgic;Lateral hip instability;Step-to pattern    Ambulation Surface  Level;Indoor      Prosthetics   Current prosthetic wear tolerance (days/week)   daily    Current prosthetic wear tolerance (#hours/day)   all awake hours except 1 hr midday.     Residual limb condition   No skin issues per pt report.     Donning Prosthesis  Modified independent (device/increased time)    Doffing  Prosthesis  Modified independent (device/increased time)         PT Short Term Goals - 04/10/18 0939      PT SHORT TERM GOAL #1   Title  Patient verbalizes adjusting ply socks to adjust limb volume changes. (All updated STGs Target Date: 04/14/2018)    Baseline  04/10/18: met today    Status  Achieved      PT SHORT TERM GOAL #2   Title  Patient tolerates prosthesis wear >90% of awake hours with PT guidance for skin & limb pain issues.     Baseline  04/10/18: met today    Status  Achieved      PT SHORT TERM GOAL #3   Title  Patient able to stand 2 minutes without UE support with supervision.     Baseline  04/10/18: pt reports she did this 2 sessions ago    Time  --    Period  --    Status  Achieved      PT SHORT TERM GOAL #4   Title  Patient ambulates 200' with RW & prosthesis with supervision.     Baseline  04/10/18: met today    Time  --    Period  --    Status  Achieved      PT SHORT TERM GOAL #5   Title  Patient negotiates ramps & curbs with RW & prosthesis with supervision.     Baseline  04/10/18: met today    Time  --    Period  --    Status  Achieved        PT Long Term Goals - 02/23/18 1135      PT LONG TERM GOAL #1   Title  Patient demonstrates and verbalizes proper prosthetic care to enable safe use of prosthesis (All LTGs Target Date: 05/12/2018)    Time  3    Period  Months    Status  On-going    Target Date  05/12/18      PT LONG TERM GOAL #2   Title  Patient tolerates wear of prosthesis >90% of awake hours without skin or limb pain issues to enable function throughout her day.     Time  3  Period  Months    Status  On-going    Target Date  05/12/18      PT LONG TERM GOAL #3   Title  Lillie Fragmin >36/56    Time  3    Period  Months    Status  On-going    Target Date  05/12/18      PT LONG TERM GOAL #4   Title  Patient ambulates 300' with LRAD & prosthesis modified independent for basic community mobility.     Time  3    Period  Months     Status  On-going    Target Date  05/12/18      PT LONG TERM GOAL #5   Title  Patient negotiates ramps, curbs & stairs with LRAD & prosthesis modified independent for community access.     Time  3    Period  Months    Status  On-going    Target Date  05/12/18            Plan - 04/20/18 5956    Clinical Impression Statement  Today's skilled session continued to focus on longer distances with RW and short distances with cane. Also discussed with pt calling prosthetist to get permanent socket as she is still in the fiberglass one. Pt to make the call today. The pt is progressing toward goals and should benefit from continued PT to progress toward unmet goals.     Rehab Potential  Good    PT Frequency  2x / week    PT Duration  Other (comment)   13 weeks (90 days)   PT Treatment/Interventions  ADLs/Self Care Home Management;DME Instruction;Gait training;Stair training;Functional mobility training;Therapeutic activities;Therapeutic exercise;Balance training;Neuromuscular re-education;Patient/family education;Prosthetic Training;Manual techniques;Vestibular    PT Next Visit Plan  begin to work on balance activities, short distance gait with cane/HHA/prosthesis limiting distance for pain management    Consulted and Agree with Plan of Care  Patient;Family member/caregiver    Family Member Consulted  husband who is deaf       Patient will benefit from skilled therapeutic intervention in order to improve the following deficits and impairments:  Abnormal gait, Decreased activity tolerance, Decreased balance, Cardiopulmonary status limiting activity, Decreased coordination, Decreased endurance, Decreased knowledge of use of DME, Decreased mobility, Decreased range of motion, Decreased skin integrity, Decreased strength, Dizziness, Impaired flexibility, Increased edema, Obesity, Prosthetic Dependency, Postural dysfunction  Visit Diagnosis: Other abnormalities of gait and mobility  Unsteadiness  on feet  Abnormal posture  Muscle weakness (generalized)     Problem List Patient Active Problem List   Diagnosis Date Noted  . Dyslipidemia 11/23/2017  . Below knee amputation status, left 08/31/2017  . Dehiscence of amputation stump ([facility]) 08/29/2017  . Infection of amputation stump, left lower extremity ([facility]) 06/19/2017  . Bradycardia 05/07/2017  . Depression 05/07/2017  . Lymphedema 05/07/2017  . Charcot foot due to diabetes mellitus ([facility]) 02/09/2017  . 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 Post-menopausal bleeding 04/15/2014  . Endometrial polyp 04/09/2014  . Ventral hernia 03/06/2012  . Open abdominal wall wound 03/06/2012  . Hyperlipemia 11/12/2008  . OBESITY, MORBID 11/12/2008  . Essential hypertension 11/12/2008  . CAD (coronary artery disease) 11/12/2008   Willow Ora, PTA, [facility] [address], [place], [place] [address] [phone] 04/21/18, 10:47 AM   Name: Julia West MRN: [id number] Date of Birth: 05/03/1955

## 2018-04-24 ENCOUNTER — Encounter: Payer: Self-pay | Admitting: Physical Therapy

## 2018-04-24 ENCOUNTER — Ambulatory Visit: Payer: Medicare Other | Admitting: Physical Therapy

## 2018-04-24 DIAGNOSIS — M6281 Muscle weakness (generalized): Secondary | ICD-10-CM

## 2018-04-24 DIAGNOSIS — R293 Abnormal posture: Secondary | ICD-10-CM

## 2018-04-24 DIAGNOSIS — M25662 Stiffness of left knee, not elsewhere classified: Secondary | ICD-10-CM

## 2018-04-24 DIAGNOSIS — R2681 Unsteadiness on feet: Secondary | ICD-10-CM

## 2018-04-24 DIAGNOSIS — R2689 Other abnormalities of gait and mobility: Secondary | ICD-10-CM | POA: Diagnosis not present

## 2018-04-24 DIAGNOSIS — M25661 Stiffness of right knee, not elsewhere classified: Secondary | ICD-10-CM | POA: Diagnosis not present

## 2018-04-24 NOTE — Therapy (Signed)
[facility] [address] [facility] [place], [place], [address] Phone: [phone]   Fax:  [phone]  Physical Therapy Treatment  Patient Details  Name: Julia West MRN: [id number] Date of Birth: 01/05/1956 Referring Provider (PT): Meridee Score, MD   Encounter Date: 04/24/2018  PT End of Session - 04/24/18 0943    Visit Number  21    Number of Visits  26    Date for PT Re-Evaluation  05/12/18    Authorization Type  Medicare & Medicaid     PT Start Time  4193    PT Stop Time  0935    PT Time Calculation (min)  45 min    Equipment Utilized During Treatment  Gait belt    Activity Tolerance  Patient tolerated treatment well;Patient limited by fatigue;No increased pain    Behavior During Therapy  WFL for tasks assessed/performed       Past Medical History:  Diagnosis Date  . Acute myocardial infarction    with a ruptured plaque in the circumflex in 2003  . Anxiety   . Cancer ([place])    basal cell face  . Complication of anesthesia    states low O2 sats post-op 11/13, always slow to awaken  . Degenerative joint disease   . 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 Dyspnea    with activity- car to house - steps  . HTN (hypertension)   . Hyperlipidemia LDL goal <70   . Internal and external hemorrhoids without complication   . Lymphedema    right side of body  . Lymphedema of arm    right  . Morbid obesity ([place])   . Surgical wound, non healing ABDOMINAL    has wound vac @ 125 mm Hg    Past Surgical History:  Procedure Laterality Date  . AMPUTATION Left 05/06/2017   Procedure: BELOW KNEE AMPUTATION;  Surgeon: Newt Minion, MD;  Location: [facility];  Service: Orthopedics;  Laterality: Left;  . APPLICATION OF WOUND VAC    . BOWEL RESECTION  02/07/2012   Procedure: SMALL BOWEL RESECTION;  Surgeon: Gayland Curry, MD,FACS;  Location: [facility];  Service: General;;  . CARDIOVASCULAR STRESS TEST  01-17-2012  DR  HOCHREIN   LOW RISK NUCLEAR TEST  . CESAREAN SECTION     x 4 in remote past  . CHOLECYSTECTOMY    . CORONARY ARTERY BYPASS GRAFT  2003   by Dr. Servando Snare. LIMA to the LAD, free RIMA to the circumflex. Stress perfusion study December 2009 with no high-risk areas of ischemia. She has a well-preserved ejection fraction  . HYSTEROSCOPY W/D&C N/A 05/14/2014   Procedure: DILATATION AND CURETTAGE (no specimen); HYSTEROSCOPY;  Surgeon: Jonnie Kind, MD;  Location: [facility];  Service: Gynecology;  Laterality: N/A;  . I&D EXTREMITY Left 06/21/2017   Procedure: IRRIGATION AND DEBRIDEMENT OF LEFT LEG AMPUTATION SITE;  Surgeon: Wallace Going, DO;  Location: [facility];  Service: Plastics;  Laterality: Left;  . INCISION AND DRAINAGE OF WOUND  04/17/2012   Procedure: IRRIGATION AND DEBRIDEMENT WOUND;  Surgeon: Theodoro Kos, DO;  Location: [facility];  Service: Plastics;  Laterality: N/A;  OF ABDOMINAL WOUND, SURGICAL PREP AND PLACEMENT OF VAC, REMOVAL FOREHEAD SKIN LESION  . INCISION AND DRAINAGE OF WOUND N/A 04/24/2012   Procedure: IRRIGATION AND DEBRIDEMENT WOUND;  Surgeon: Theodoro Kos, DO;  Location: [facility];  Service: Plastics;  Laterality: N/A;  I &  D ABDOMINAL WOUND WITH VAC AND ACELL  . INCISION AND DRAINAGE OF WOUND N/A 05/01/2012   Procedure: IRRIGATION AND DEBRIDEMENT WOUND;  Surgeon: Theodoro Kos, DO;  Location: [facility];  Service: Plastics;  Laterality: N/A;  WITH SURGICAL PREP AND PLACEMENT OF VAC  . INCISION AND DRAINAGE OF WOUND N/A 05/08/2012   Procedure: IRRIGATION AND DEBRIDEMENT OF ABD WOUND SURGICAL PREP AND PLACEMENT OF VAC ;  Surgeon: Theodoro Kos, DO;  Location: [facility];  Service: Plastics;  Laterality: N/A;  IRRIGATION AND DEBRIDEMENT OF ABD WOUND SURGICAL PREP AND PLACEMENT OF VAC   . INCISION AND DRAINAGE OF WOUND N/A 05/15/2012   Procedure: IRRIGATION AND DEBRIDEMENT OF ABDOMINAL WOUND WITH POSSIBLE SURGICAL PREP AND  PLACEMENT OF VAC;  Surgeon: Theodoro Kos, DO;  Location: [facility];  Service: Plastics;  Laterality: N/A;  . INCISION AND DRAINAGE OF WOUND N/A 05/22/2012   Procedure: IRRIGATION AND DEBRIDEMENT OF ABODOMINAL WOUND WITH  SURGICAL PREP AND VAC PLACEMENT;  Surgeon: Theodoro Kos, DO;  Location: [facility];  Service: Plastics;  Laterality: N/A;  . INCISION AND DRAINAGE OF WOUND N/A 06/28/2012   Procedure: IRRIGATION AND DEBRIDEMENT OF ABDOMINAL ULCER SURGICAL PREP AND PLACEMENT OF ACELL AND VAC;  Surgeon: Theodoro Kos, DO;  Location: [facility];  Service: Plastics;  Laterality: N/A;  . INCISIONAL HERNIA REPAIR  02/07/2012   Procedure: HERNIA REPAIR INCISIONAL;  Surgeon: Gayland Curry, MD,FACS;  Location: [facility];  Service: General;;  Open, Primary repair, strangulated Incisional hernia.  Marland Kitchen LESION REMOVAL  04/17/2012   Procedure: LESION REMOVAL;  Surgeon: Theodoro Kos, DO;  Location: [facility];  Service: Plastics;  Laterality: N/A;   CENTER OF FOREHEAD, REMOVAL FORHEAD SKIN LESION  . ORIF TOE FRACTURE Left 02/09/2017   Procedure: EXCISION TALAR HEAD, INTERNAL FIXATION MEDIAL COLUMN LEFT FOOT;  Surgeon: Newt Minion, MD;  Location: [facility];  Service: Orthopedics;  Laterality: Left;  . POLYPECTOMY N/A 05/14/2014   Procedure: ENDOMETRIAL POLYPECTOMY;  Surgeon: Jonnie Kind, MD;  Location: [facility];  Service: Gynecology;  Laterality: N/A;  . STUMP REVISION Left 08/31/2017   Procedure: REVISION LEFT BELOW KNEE AMPUTATION;  Surgeon: Newt Minion, MD;  Location: [facility];  Service: Orthopedics;  Laterality: Left;    There were no vitals filed for this visit.  Subjective Assessment - 04/24/18 0845    Subjective  She went to [facility]. She walked with family with walker in/out of rest areas & restaurant. She had difficulty with cobblestone walkway.     Patient is accompained by:  Family member    Pertinent History  L TTA, bradycardia, depression,  lymphedema, DM2, HLD, HTN, CAD, MI, CABG, CA basal cell face, bowel resection,     Limitations  Lifting;Standing;Walking;House hold activities    Patient Stated Goals  To use prosthesis to get in/out cars, bathrooms, access community & home    Currently in Pain?  No/denies                       [facility] Adult PT Treatment/Exercise - 04/24/18 0935      Transfers   Transfers  Sit to Stand;Stand to Sit    Sit to Stand  5: Supervision;With armrests;From chair/3-in-1;With upper extremity assist   to crutches   Sit to Stand Details  Visual cues for safe use of DME/AE;Verbal cues for technique;Verbal cues for safe use of DME/AE    Stand to Sit  5: Supervision;With upper extremity assist;With armrests;To chair/3-in-1   from crutches   Stand to Sit Details (indicate cue type and reason)  Visual cues for safe use of DME/AE;Verbal cues for safe use of DME/AE;Verbal cues for technique      Ambulation/Gait   Ambulation/Gait  Yes    Ambulation/Gait Assistance  4: Min guard    Ambulation/Gait Assistance Details  PT demo & instructed in 3-pt pattern with crutches. Initiated training indoors & progressed to outdoors including grass & gravel.     Ambulation Distance (Feet)  150 Feet   150' indoors & 100' outdoors   Assistive device  Prosthesis;Straight cane;1 person hand held assist;Crutches    Gait Pattern  Decreased step length - right;Decreased stance time - left;Right hip hike;Left flexed knee in stance;Right flexed knee in stance;Antalgic;Lateral hip instability;Step-to pattern    Ambulation Surface  Indoor;Level;Outdoor;Paved;Gravel;Grass    Ramp  4: Min assist   crutches & prosthesis   Ramp Details (indicate cue type and reason)  verbal & tactile cues on technique with crutches & prosthesis    Curb  4: Min assist   crutches & prosthesis   Curb Details (indicate cue type and reason)  verbal & tactile cues on technique with crutches & prosthesis      Prosthetics   Prosthetic Care  Comments   PT educated on possibility of switching liners with bathing and leaving liners in bathroom when not in use to limit exposure to dog hair.     Current prosthetic wear tolerance (days/week)   daily    Current prosthetic wear tolerance (#hours/day)   all awake hours except 1 hr midday.     Residual limb condition   No skin issues.     Education Provided  Skin check;Residual limb care;Other (comment)   see prosthetic care comments   Person(s) Educated  Patient    Education Method  Explanation;Verbal cues    Education Method  Verbalized understanding               PT Short Term Goals - 04/10/18 0939      PT SHORT TERM GOAL #1   Title  Patient verbalizes adjusting ply socks to adjust limb volume changes. (All updated STGs Target Date: 04/14/2018)    Baseline  04/10/18: met today    Status  Achieved      PT SHORT TERM GOAL #2   Title  Patient tolerates prosthesis wear >90% of awake hours with PT guidance for skin & limb pain issues.     Baseline  04/10/18: met today    Status  Achieved      PT SHORT TERM GOAL #3   Title  Patient able to stand 2 minutes without UE support with supervision.     Baseline  04/10/18: pt reports she did this 2 sessions ago    Time  --    Period  --    Status  Achieved      PT SHORT TERM GOAL #4   Title  Patient ambulates 200' with RW & prosthesis with supervision.     Baseline  04/10/18: met today    Time  --    Period  --    Status  Achieved      PT SHORT TERM GOAL #5   Title  Patient negotiates ramps & curbs with RW & prosthesis with supervision.     Baseline  04/10/18: met today    Time  --    Period  --  Status  Achieved        PT Long Term Goals - 02/23/18 1135      PT LONG TERM GOAL #1   Title  Patient demonstrates and verbalizes proper prosthetic care to enable safe use of prosthesis (All LTGs Target Date: 05/12/2018)    Time  3    Period  Months    Status  On-going    Target Date  05/12/18      PT LONG TERM GOAL #2    Title  Patient tolerates wear of prosthesis >90% of awake hours without skin or limb pain issues to enable function throughout her day.     Time  3    Period  Months    Status  On-going    Target Date  05/12/18      PT LONG TERM GOAL #3   Title  Lillie Fragmin >36/56    Time  3    Period  Months    Status  On-going    Target Date  05/12/18      PT LONG TERM GOAL #4   Title  Patient ambulates 300' with LRAD & prosthesis modified independent for basic community mobility.     Time  3    Period  Months    Status  On-going    Target Date  05/12/18      PT LONG TERM GOAL #5   Title  Patient negotiates ramps, curbs & stairs with LRAD & prosthesis modified independent for community access.     Time  3    Period  Months    Status  On-going    Target Date  05/12/18            Plan - 04/24/18 1010    Clinical Impression Statement  Today's skilled physical therapy session focused on educating patient on use of crutches for outdoor activities. patient was able to use crutches for grass and gravel with physical therapist assistance With additional training patient should be able to access more environments with crutches than the walker     Rehab Potential  Good    PT Frequency  2x / week    PT Duration  Other (comment)   13 weeks (90 days)   PT Treatment/Interventions  ADLs/Self Care Home Management;DME Instruction;Gait training;Stair training;Functional mobility training;Therapeutic activities;Therapeutic exercise;Balance training;Neuromuscular re-education;Patient/family education;Prosthetic Training;Manual techniques;Vestibular    PT Next Visit Plan  begin to work on balance activities, short distance gait with cane/HHA/prosthesis limiting distance for pain management, community type activities with crutches including ramps & curbs, stairs single rail.     Consulted and Agree with Plan of Care  Patient;Family member/caregiver    Family Member Consulted  husband who is deaf        Patient will benefit from skilled therapeutic intervention in order to improve the following deficits and impairments:  Abnormal gait, Decreased activity tolerance, Decreased balance, Cardiopulmonary status limiting activity, Decreased coordination, Decreased endurance, Decreased knowledge of use of DME, Decreased mobility, Decreased range of motion, Decreased skin integrity, Decreased strength, Dizziness, Impaired flexibility, Increased edema, Obesity, Prosthetic Dependency, Postural dysfunction  Visit Diagnosis: Other abnormalities of gait and mobility  Unsteadiness on feet  Abnormal posture  Muscle weakness (generalized)  Stiffness of left knee, not elsewhere classified  Stiffness of right knee, not elsewhere classified     Problem List Patient Active Problem List   Diagnosis Date Noted  . Dyslipidemia 11/23/2017  . Below knee amputation status, left 08/31/2017  . Dehiscence of  amputation stump ([place]) 08/29/2017  . Infection of amputation stump, left lower extremity ([place]) 06/19/2017  . Bradycardia 05/07/2017  . Depression 05/07/2017  . Lymphedema 05/07/2017  . Charcot foot due to diabetes mellitus ([place]) 02/09/2017  . 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 Post-menopausal bleeding 04/15/2014  . Endometrial polyp 04/09/2014  . Ventral hernia 03/06/2012  . Open abdominal wall wound 03/06/2012  . Hyperlipemia 11/12/2008  . OBESITY, MORBID 11/12/2008  . Essential hypertension 11/12/2008  . CAD (coronary artery disease) 11/12/2008    Jamey Reas PT, DPT 04/24/2018, 10:17 AM  [facility] [address] [place], [place], [address] Phone: [phone]   Fax:  [phone]  Name: Julia West MRN: [id number] Date of Birth: 05/13/1955

## 2018-04-27 ENCOUNTER — Encounter: Payer: Self-pay | Admitting: Physical Therapy

## 2018-04-27 ENCOUNTER — Ambulatory Visit: Payer: Medicare Other | Admitting: Physical Therapy

## 2018-04-27 DIAGNOSIS — M25662 Stiffness of left knee, not elsewhere classified: Secondary | ICD-10-CM | POA: Diagnosis not present

## 2018-04-27 DIAGNOSIS — M6281 Muscle weakness (generalized): Secondary | ICD-10-CM

## 2018-04-27 DIAGNOSIS — R2681 Unsteadiness on feet: Secondary | ICD-10-CM

## 2018-04-27 DIAGNOSIS — R293 Abnormal posture: Secondary | ICD-10-CM

## 2018-04-27 DIAGNOSIS — M25661 Stiffness of right knee, not elsewhere classified: Secondary | ICD-10-CM | POA: Diagnosis not present

## 2018-04-27 DIAGNOSIS — R2689 Other abnormalities of gait and mobility: Secondary | ICD-10-CM

## 2018-04-28 NOTE — Therapy (Signed)
[facility] [address] [place], [place], [address] Phone: [phone]   Fax:  [phone]  Physical Therapy Treatment  Patient Details  Name: Julia West MRN: [id number] Date of Birth: 31-May-1955 Referring Provider (PT): Meridee Score, MD   Encounter Date: 04/27/2018  PT End of Session - 04/27/18 0935    Visit Number  22    Number of Visits  26    Date for PT Re-Evaluation  05/12/18    Authorization Type  Medicare & Medicaid     PT Start Time  0933    PT Stop Time  1014    PT Time Calculation (min)  41 min    Equipment Utilized During Treatment  Gait belt    Activity Tolerance  Patient tolerated treatment well    Behavior During Therapy  [facility] for tasks assessed/performed       Past Medical History:  Diagnosis Date  . Acute myocardial infarction    with a ruptured plaque in the circumflex in 2003  . Anxiety   . Cancer ([place])    basal cell face  . Complication of anesthesia    states low O2 sats post-op 11/13, always slow to awaken  . Degenerative joint disease   . 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 Dyspnea    with activity- car to house - steps  . HTN (hypertension)   . Hyperlipidemia LDL goal <70   . Internal and external hemorrhoids without complication   . Lymphedema    right side of body  . Lymphedema of arm    right  . Morbid obesity ([place])   . Surgical wound, non healing ABDOMINAL    has wound vac @ 125 mm Hg    Past Surgical History:  Procedure Laterality Date  . AMPUTATION Left 05/06/2017   Procedure: BELOW KNEE AMPUTATION;  Surgeon: Newt Minion, MD;  Location: [facility];  Service: Orthopedics;  Laterality: Left;  . APPLICATION OF WOUND VAC    . BOWEL RESECTION  02/07/2012   Procedure: SMALL BOWEL RESECTION;  Surgeon: Gayland Curry, MD,FACS;  Location: [facility];  Service: General;;  . CARDIOVASCULAR STRESS TEST  01-17-2012  DR HOCHREIN   LOW RISK NUCLEAR TEST  . CESAREAN  SECTION     x 4 in remote past  . CHOLECYSTECTOMY    . CORONARY ARTERY BYPASS GRAFT  2003   by Dr. Servando Snare. LIMA to the LAD, free RIMA to the circumflex. Stress perfusion study December 2009 with no high-risk areas of ischemia. She has a well-preserved ejection fraction  . HYSTEROSCOPY W/D&C N/A 05/14/2014   Procedure: DILATATION AND CURETTAGE (no specimen); HYSTEROSCOPY;  Surgeon: Jonnie Kind, MD;  Location: [facility];  Service: Gynecology;  Laterality: N/A;  . I&D EXTREMITY Left 06/21/2017   Procedure: IRRIGATION AND DEBRIDEMENT OF LEFT LEG AMPUTATION SITE;  Surgeon: Wallace Going, DO;  Location: [facility];  Service: Plastics;  Laterality: Left;  . INCISION AND DRAINAGE OF WOUND  04/17/2012   Procedure: IRRIGATION AND DEBRIDEMENT WOUND;  Surgeon: Theodoro Kos, DO;  Location: [facility];  Service: Plastics;  Laterality: N/A;  OF ABDOMINAL WOUND, SURGICAL PREP AND PLACEMENT OF VAC, REMOVAL FOREHEAD SKIN LESION  . INCISION AND DRAINAGE OF WOUND N/A 04/24/2012   Procedure: IRRIGATION AND DEBRIDEMENT WOUND;  Surgeon: Theodoro Kos, DO;  Location: [facility];  Service: Plastics;  Laterality: N/A;  I & D ABDOMINAL WOUND WITH VAC  AND ACELL  . INCISION AND DRAINAGE OF WOUND N/A 05/01/2012   Procedure: IRRIGATION AND DEBRIDEMENT WOUND;  Surgeon: Theodoro Kos, DO;  Location: [facility];  Service: Plastics;  Laterality: N/A;  WITH SURGICAL PREP AND PLACEMENT OF VAC  . INCISION AND DRAINAGE OF WOUND N/A 05/08/2012   Procedure: IRRIGATION AND DEBRIDEMENT OF ABD WOUND SURGICAL PREP AND PLACEMENT OF VAC ;  Surgeon: Theodoro Kos, DO;  Location: [facility];  Service: Plastics;  Laterality: N/A;  IRRIGATION AND DEBRIDEMENT OF ABD WOUND SURGICAL PREP AND PLACEMENT OF VAC   . INCISION AND DRAINAGE OF WOUND N/A 05/15/2012   Procedure: IRRIGATION AND DEBRIDEMENT OF ABDOMINAL WOUND WITH POSSIBLE SURGICAL PREP AND PLACEMENT OF VAC;  Surgeon: Theodoro Kos, DO;   Location: [facility];  Service: Plastics;  Laterality: N/A;  . INCISION AND DRAINAGE OF WOUND N/A 05/22/2012   Procedure: IRRIGATION AND DEBRIDEMENT OF ABODOMINAL WOUND WITH  SURGICAL PREP AND VAC PLACEMENT;  Surgeon: Theodoro Kos, DO;  Location: [facility];  Service: Plastics;  Laterality: N/A;  . INCISION AND DRAINAGE OF WOUND N/A 06/28/2012   Procedure: IRRIGATION AND DEBRIDEMENT OF ABDOMINAL ULCER SURGICAL PREP AND PLACEMENT OF ACELL AND VAC;  Surgeon: Theodoro Kos, DO;  Location: [facility];  Service: Plastics;  Laterality: N/A;  . INCISIONAL HERNIA REPAIR  02/07/2012   Procedure: HERNIA REPAIR INCISIONAL;  Surgeon: Gayland Curry, MD,FACS;  Location: [facility];  Service: General;;  Open, Primary repair, strangulated Incisional hernia.  Marland Kitchen LESION REMOVAL  04/17/2012   Procedure: LESION REMOVAL;  Surgeon: Theodoro Kos, DO;  Location: [facility];  Service: Plastics;  Laterality: N/A;   CENTER OF FOREHEAD, REMOVAL FORHEAD SKIN LESION  . ORIF TOE FRACTURE Left 02/09/2017   Procedure: EXCISION TALAR HEAD, INTERNAL FIXATION MEDIAL COLUMN LEFT FOOT;  Surgeon: Newt Minion, MD;  Location: [facility];  Service: Orthopedics;  Laterality: Left;  . POLYPECTOMY N/A 05/14/2014   Procedure: ENDOMETRIAL POLYPECTOMY;  Surgeon: Jonnie Kind, MD;  Location: [facility];  Service: Gynecology;  Laterality: N/A;  . STUMP REVISION Left 08/31/2017   Procedure: REVISION LEFT BELOW KNEE AMPUTATION;  Surgeon: Newt Minion, MD;  Location: [facility];  Service: Orthopedics;  Laterality: Left;    There were no vitals filed for this visit.  Subjective Assessment - 04/27/18 0934    Subjective  No new complaints. No falls or pain to report. See's [name] today after session.     Patient is accompained by:  Family member    Pertinent History  L TTA, bradycardia, depression, lymphedema, DM2, HLD, HTN, CAD, MI, CABG, CA basal cell face, bowel resection,     Limitations   Lifting;Standing;Walking;House hold activities    Patient Stated Goals  To use prosthesis to get in/out cars, bathrooms, access community & home    Currently in Pain?  No/denies            04/27/18 0936  Transfers  Transfers Sit to Stand;Stand to Sit  Sit to Stand 5: Supervision;With armrests;From chair/3-in-1;With upper extremity assist  Sit to Stand Details Verbal cues for technique;Verbal cues for safe use of DME/AE;Verbal cues for sequencing  Stand to Sit 5: Supervision;With upper extremity assist;With armrests;To chair/3-in-1  Stand to Sit Details (indicate cue type and reason) Verbal cues for safe use of DME/AE;Verbal cues for technique;Verbal cues for sequencing  Ambulation/Gait  Ambulation/Gait Yes  Ambulation/Gait Assistance 4: Min guard  Ambulation/Gait Assistance Details longer distance with bil  axillary crutches with min guard assist, cues on posture and sequencing using 3 point gait pattern. shorter distance with cane with rubber quad tip with min assist for balance/contralateral HHA. cues on posture, step length, weight shifting and cane placement needed.  Ambulation Distance (Feet) 210 Feet (x1 crutches, 35 x2)  Assistive device Prosthesis;Crutches  Gait Pattern Decreased step length - right;Decreased stance time - left;Right hip hike;Left flexed knee in stance;Right flexed knee in stance;Antalgic;Lateral hip instability;Step-to pattern  Ambulation Surface Level;Indoor  Ramp 4: Min assist  Ramp Details (indicate cue type and reason) with crutches/prosthesis: cues on posture and sequencing.  Curb 4: Min assist  Curb Details (indicate cue type and reason) with crutches/prosthesis: cues on sequencing/technique, min assist to ascend with min guard assist to descend  6 inch curb indoors.   Prosthetics  Prosthetic Care Comments  Pt sees prosthetist today after PT session, hoping to get new socket as she is in her fiberglass socket currently.   Current prosthetic wear tolerance  (days/week)  daily  Current prosthetic wear tolerance (#hours/day)  all awake hours except 1 hr midday.   Residual limb condition  No skin issues.   Donning Prosthesis 6  Doffing Prosthesis 6         PT Short Term Goals - 04/10/18 0939      PT SHORT TERM GOAL #1   Title  Patient verbalizes adjusting ply socks to adjust limb volume changes. (All updated STGs Target Date: 04/14/2018)    Baseline  04/10/18: met today    Status  Achieved      PT SHORT TERM GOAL #2   Title  Patient tolerates prosthesis wear >90% of awake hours with PT guidance for skin & limb pain issues.     Baseline  04/10/18: met today    Status  Achieved      PT SHORT TERM GOAL #3   Title  Patient able to stand 2 minutes without UE support with supervision.     Baseline  04/10/18: pt reports she did this 2 sessions ago    Time  --    Period  --    Status  Achieved      PT SHORT TERM GOAL #4   Title  Patient ambulates 200' with RW & prosthesis with supervision.     Baseline  04/10/18: met today    Time  --    Period  --    Status  Achieved      PT SHORT TERM GOAL #5   Title  Patient negotiates ramps & curbs with RW & prosthesis with supervision.     Baseline  04/10/18: met today    Time  --    Period  --    Status  Achieved        PT Long Term Goals - 02/23/18 1135      PT LONG TERM GOAL #1   Title  Patient demonstrates and verbalizes proper prosthetic care to enable safe use of prosthesis (All LTGs Target Date: 05/12/2018)    Time  3    Period  Months    Status  On-going    Target Date  05/12/18      PT LONG TERM GOAL #2   Title  Patient tolerates wear of prosthesis >90% of awake hours without skin or limb pain issues to enable function throughout her day.     Time  3    Period  Months    Status  On-going  Target Date  05/12/18      PT LONG TERM GOAL #3   Title  Julia West >36/56    Time  3    Period  Months    Status  On-going    Target Date  05/12/18      PT LONG TERM GOAL #4    Title  Patient ambulates 300' with LRAD & prosthesis modified independent for basic community mobility.     Time  3    Period  Months    Status  On-going    Target Date  05/12/18      PT LONG TERM GOAL #5   Title  Patient negotiates ramps, curbs & stairs with LRAD & prosthesis modified independent for community access.     Time  3    Period  Months    Status  On-going    Target Date  05/12/18         04/27/18 0935  Plan  Clinical Impression Statement Today's skilled session continued to address use of axillary crutches and cane with gait with prosthesis with no significant issues reported. Pt does have increased knee pain with longer distances and increased weight bearing that is relieved with seated rest breaks. The pt is progressing toward goals and should benefit from continued PT to progress toward unmet goals.   Pt will benefit from skilled therapeutic intervention in order to improve on the following deficits Abnormal gait;Decreased activity tolerance;Decreased balance;Cardiopulmonary status limiting activity;Decreased coordination;Decreased endurance;Decreased knowledge of use of DME;Decreased mobility;Decreased range of motion;Decreased skin integrity;Decreased strength;Dizziness;Impaired flexibility;Increased edema;Obesity;Prosthetic Dependency;Postural dysfunction  Rehab Potential Good  PT Frequency 2x / week  PT Duration Other (comment) (13 weeks (90 days))  PT Treatment/Interventions ADLs/Self Care Home Management;DME Instruction;Gait training;Stair training;Functional mobility training;Therapeutic activities;Therapeutic exercise;Balance training;Neuromuscular re-education;Patient/family education;Prosthetic Training;Manual techniques;Vestibular  PT Next Visit Plan begin to work on balance activities, short distance gait with cane/HHA/prosthesis limiting distance for pain management, community type activities with crutches including ramps & curbs, stairs single rail.   Consulted  and Agree with Plan of Care Patient;Family member/caregiver  Family Member Consulted husband who is deaf          Patient will benefit from skilled therapeutic intervention in order to improve the following deficits and impairments:  Abnormal gait, Decreased activity tolerance, Decreased balance, Cardiopulmonary status limiting activity, Decreased coordination, Decreased endurance, Decreased knowledge of use of DME, Decreased mobility, Decreased range of motion, Decreased skin integrity, Decreased strength, Dizziness, Impaired flexibility, Increased edema, Obesity, Prosthetic Dependency, Postural dysfunction  Visit Diagnosis: Other abnormalities of gait and mobility  Unsteadiness on feet  Abnormal posture  Muscle weakness (generalized)     Problem List Patient Active Problem List   Diagnosis Date Noted  . Dyslipidemia 11/23/2017  . Below knee amputation status, left 08/31/2017  . Dehiscence of amputation stump ([place]) 08/29/2017  . Infection of amputation stump, left lower extremity ([place]) 06/19/2017  . Bradycardia 05/07/2017  . Depression 05/07/2017  . Lymphedema 05/07/2017  . Charcot foot due to diabetes mellitus ([place]) 02/09/2017  . 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 Post-menopausal bleeding 04/15/2014  . Endometrial polyp 04/09/2014  . Ventral hernia 03/06/2012  . Open abdominal wall wound 03/06/2012  . Hyperlipemia 11/12/2008  . OBESITY, MORBID 11/12/2008  . Essential hypertension 11/12/2008  . CAD (coronary artery disease) 11/12/2008    Willow Ora, PTA, [facility] [address], [place], [place] [address] [phone] 04/28/18, 10:05 AM  Name: Julia West MRN: [id number] Date of Birth: 1955/08/30

## 2018-05-01 ENCOUNTER — Ambulatory Visit: Payer: Medicare Other | Admitting: Physical Therapy

## 2018-05-01 ENCOUNTER — Encounter: Payer: Self-pay | Admitting: Physical Therapy

## 2018-05-01 DIAGNOSIS — R293 Abnormal posture: Secondary | ICD-10-CM | POA: Diagnosis not present

## 2018-05-01 DIAGNOSIS — R2681 Unsteadiness on feet: Secondary | ICD-10-CM

## 2018-05-01 DIAGNOSIS — R2689 Other abnormalities of gait and mobility: Secondary | ICD-10-CM

## 2018-05-01 DIAGNOSIS — M6281 Muscle weakness (generalized): Secondary | ICD-10-CM | POA: Diagnosis not present

## 2018-05-01 DIAGNOSIS — M25662 Stiffness of left knee, not elsewhere classified: Secondary | ICD-10-CM | POA: Diagnosis not present

## 2018-05-01 DIAGNOSIS — M25661 Stiffness of right knee, not elsewhere classified: Secondary | ICD-10-CM | POA: Diagnosis not present

## 2018-05-01 NOTE — Therapy (Signed)
[facility] [address] [place], [place], [address] Phone: [phone]   Fax:  [phone]  Physical Therapy Treatment  Patient Details  Name: Julia West MRN: [id number] Date of Birth: 1956-03-03 Referring Provider (PT): Meridee Score, MD   Encounter Date: 05/01/2018  PT End of Session - 05/01/18 0937    Visit Number  23    Number of Visits  26    Date for PT Re-Evaluation  05/12/18    Authorization Type  Medicare & Medicaid     PT Start Time  0933    PT Stop Time  1015    PT Time Calculation (min)  42 min    Equipment Utilized During Treatment  Gait belt    Activity Tolerance  Patient tolerated treatment well    Behavior During Therapy  [facility] for tasks assessed/performed       Past Medical History:  Diagnosis Date  . Acute myocardial infarction    with a ruptured plaque in the circumflex in 2003  . Anxiety   . Cancer ([place])    basal cell face  . Complication of anesthesia    states low O2 sats post-op 11/13, always slow to awaken  . Degenerative joint disease   . 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 Dyspnea    with activity- car to house - steps  . HTN (hypertension)   . Hyperlipidemia LDL goal <70   . Internal and external hemorrhoids without complication   . Lymphedema    right side of body  . Lymphedema of arm    right  . Morbid obesity ([place])   . Surgical wound, non healing ABDOMINAL    has wound vac @ 125 mm Hg    Past Surgical History:  Procedure Laterality Date  . AMPUTATION Left 05/06/2017   Procedure: BELOW KNEE AMPUTATION;  Surgeon: Newt Minion, MD;  Location: [facility];  Service: Orthopedics;  Laterality: Left;  . APPLICATION OF WOUND VAC    . BOWEL RESECTION  02/07/2012   Procedure: SMALL BOWEL RESECTION;  Surgeon: Gayland Curry, MD,FACS;  Location: [facility];  Service: General;;  . CARDIOVASCULAR STRESS TEST  01-17-2012  DR HOCHREIN   LOW RISK NUCLEAR TEST  . CESAREAN  SECTION     x 4 in remote past  . CHOLECYSTECTOMY    . CORONARY ARTERY BYPASS GRAFT  2003   by Dr. Servando Snare. LIMA to the LAD, free RIMA to the circumflex. Stress perfusion study December 2009 with no high-risk areas of ischemia. She has a well-preserved ejection fraction  . HYSTEROSCOPY W/D&C N/A 05/14/2014   Procedure: DILATATION AND CURETTAGE (no specimen); HYSTEROSCOPY;  Surgeon: Jonnie Kind, MD;  Location: [facility];  Service: Gynecology;  Laterality: N/A;  . I&D EXTREMITY Left 06/21/2017   Procedure: IRRIGATION AND DEBRIDEMENT OF LEFT LEG AMPUTATION SITE;  Surgeon: Wallace Going, DO;  Location: [facility];  Service: Plastics;  Laterality: Left;  . INCISION AND DRAINAGE OF WOUND  04/17/2012   Procedure: IRRIGATION AND DEBRIDEMENT WOUND;  Surgeon: Theodoro Kos, DO;  Location: [facility];  Service: Plastics;  Laterality: N/A;  OF ABDOMINAL WOUND, SURGICAL PREP AND PLACEMENT OF VAC, REMOVAL FOREHEAD SKIN LESION  . INCISION AND DRAINAGE OF WOUND N/A 04/24/2012   Procedure: IRRIGATION AND DEBRIDEMENT WOUND;  Surgeon: Theodoro Kos, DO;  Location: [facility];  Service: Plastics;  Laterality: N/A;  I & D ABDOMINAL WOUND WITH VAC  AND ACELL  . INCISION AND DRAINAGE OF WOUND N/A 05/01/2012   Procedure: IRRIGATION AND DEBRIDEMENT WOUND;  Surgeon: Theodoro Kos, DO;  Location: [facility];  Service: Plastics;  Laterality: N/A;  WITH SURGICAL PREP AND PLACEMENT OF VAC  . INCISION AND DRAINAGE OF WOUND N/A 05/08/2012   Procedure: IRRIGATION AND DEBRIDEMENT OF ABD WOUND SURGICAL PREP AND PLACEMENT OF VAC ;  Surgeon: Theodoro Kos, DO;  Location: [facility];  Service: Plastics;  Laterality: N/A;  IRRIGATION AND DEBRIDEMENT OF ABD WOUND SURGICAL PREP AND PLACEMENT OF VAC   . INCISION AND DRAINAGE OF WOUND N/A 05/15/2012   Procedure: IRRIGATION AND DEBRIDEMENT OF ABDOMINAL WOUND WITH POSSIBLE SURGICAL PREP AND PLACEMENT OF VAC;  Surgeon: Theodoro Kos, DO;   Location: [facility];  Service: Plastics;  Laterality: N/A;  . INCISION AND DRAINAGE OF WOUND N/A 05/22/2012   Procedure: IRRIGATION AND DEBRIDEMENT OF ABODOMINAL WOUND WITH  SURGICAL PREP AND VAC PLACEMENT;  Surgeon: Theodoro Kos, DO;  Location: [facility];  Service: Plastics;  Laterality: N/A;  . INCISION AND DRAINAGE OF WOUND N/A 06/28/2012   Procedure: IRRIGATION AND DEBRIDEMENT OF ABDOMINAL ULCER SURGICAL PREP AND PLACEMENT OF ACELL AND VAC;  Surgeon: Theodoro Kos, DO;  Location: [facility];  Service: Plastics;  Laterality: N/A;  . INCISIONAL HERNIA REPAIR  02/07/2012   Procedure: HERNIA REPAIR INCISIONAL;  Surgeon: Gayland Curry, MD,FACS;  Location: [facility];  Service: General;;  Open, Primary repair, strangulated Incisional hernia.  Marland Kitchen LESION REMOVAL  04/17/2012   Procedure: LESION REMOVAL;  Surgeon: Theodoro Kos, DO;  Location: [facility];  Service: Plastics;  Laterality: N/A;   CENTER OF FOREHEAD, REMOVAL FORHEAD SKIN LESION  . ORIF TOE FRACTURE Left 02/09/2017   Procedure: EXCISION TALAR HEAD, INTERNAL FIXATION MEDIAL COLUMN LEFT FOOT;  Surgeon: Newt Minion, MD;  Location: [facility];  Service: Orthopedics;  Laterality: Left;  . POLYPECTOMY N/A 05/14/2014   Procedure: ENDOMETRIAL POLYPECTOMY;  Surgeon: Jonnie Kind, MD;  Location: [facility];  Service: Gynecology;  Laterality: N/A;  . STUMP REVISION Left 08/31/2017   Procedure: REVISION LEFT BELOW KNEE AMPUTATION;  Surgeon: Newt Minion, MD;  Location: [facility];  Service: Orthopedics;  Laterality: Left;    There were no vitals filed for this visit.  Subjective Assessment - 05/01/18 0936    Subjective  Julia West said he hopes to have socket ready Tues or Thursday, most likely Thursday. No falls.     Patient is accompained by:  Family member    Pertinent History  L TTA, bradycardia, depression, lymphedema, DM2, HLD, HTN, CAD, MI, CABG, CA basal cell face, bowel resection,     Limitations   Lifting;Standing;Walking;House hold activities    Patient Stated Goals  To use prosthesis to get in/out cars, bathrooms, access community & home    Currently in Pain?  No/denies           [facility] Adult PT Treatment/Exercise - 05/01/18 0938      Transfers   Transfers  Sit to Stand;Stand to Sit    Sit to Stand  5: Supervision;With armrests;From chair/3-in-1;With upper extremity assist    Stand to Sit  5: Supervision;With upper extremity assist;With armrests;To chair/3-in-1      Ambulation/Gait   Ambulation/Gait  Yes    Ambulation/Gait Assistance  4: Min guard;5: Supervision    Ambulation/Gait Assistance Details  continued to work on gait with crutches with supervision to min guard assist needed  with cues on crutch placement, posture and step length. with cane/prosthesis: min guard to min HHA with cues on posture, step length and weight shifting with gait.     Ambulation Distance (Feet)  210 Feet   x1 crutches; 45x1,  25x1, 55 x1 w/cane   Assistive device  Prosthesis;Crutches;Straight cane   cane with rubber quad tip   Gait Pattern  Decreased step length - right;Decreased stance time - left;Right hip hike;Left flexed knee in stance;Right flexed knee in stance;Antalgic;Lateral hip instability;Step-to pattern    Ambulation Surface  Level;Indoor      High Level Balance   High Level Balance Activities  Side stepping;Backward walking;Marching forwards    High Level Balance Comments  at counter with bil UE support/counter and cane support:  3 laps each with min guard to min HHA for balance, cues on posture, weight shifting and technique.       Prosthetics   Current prosthetic wear tolerance (days/week)   daily    Current prosthetic wear tolerance (#hours/day)   all awake hours except 1 hr midday.     Residual limb condition   No skin issues.     Donning Prosthesis  Modified independent (device/increased time)    Doffing Prosthesis  Modified independent (device/increased time)           Balance Exercises - 05/01/18 1003      Balance Exercises: Standing   Standing Eyes Opened  Wide (BOA);Foam/compliant surface;Head turns;Other reps (comment);30 secs;Limitations      Balance Exercises: Standing   Standing Eyes Opened Limitations  on airex in parallel bars with no UE support: EC no head movements progressing to EC head movements left<>right, then up<>down. min guard to min assist with cues on posture and weight shifting to assist with balance.            PT Short Term Goals - 04/10/18 0939      PT SHORT TERM GOAL #1   Title  Patient verbalizes adjusting ply socks to adjust limb volume changes. (All updated STGs Target Date: 04/14/2018)    Baseline  04/10/18: met today    Status  Achieved      PT SHORT TERM GOAL #2   Title  Patient tolerates prosthesis wear >90% of awake hours with PT guidance for skin & limb pain issues.     Baseline  04/10/18: met today    Status  Achieved      PT SHORT TERM GOAL #3   Title  Patient able to stand 2 minutes without UE support with supervision.     Baseline  04/10/18: pt reports she did this 2 sessions ago    Time  --    Period  --    Status  Achieved      PT SHORT TERM GOAL #4   Title  Patient ambulates 200' with RW & prosthesis with supervision.     Baseline  04/10/18: met today    Time  --    Period  --    Status  Achieved      PT SHORT TERM GOAL #5   Title  Patient negotiates ramps & curbs with RW & prosthesis with supervision.     Baseline  04/10/18: met today    Time  --    Period  --    Status  Achieved        PT Long Term Goals - 02/23/18 1135      PT LONG TERM GOAL #1   Title  Patient demonstrates and verbalizes proper prosthetic care to enable safe use of prosthesis (All LTGs Target Date: 05/12/2018)    Time  3    Period  Months    Status  On-going    Target Date  05/12/18      PT LONG TERM GOAL #2   Title  Patient tolerates wear of prosthesis >90% of awake hours without skin or limb pain issues  to enable function throughout her day.     Time  3    Period  Months    Status  On-going    Target Date  05/12/18      PT LONG TERM GOAL #3   Title  Lillie Fragmin >36/56    Time  3    Period  Months    Status  On-going    Target Date  05/12/18      PT LONG TERM GOAL #4   Title  Patient ambulates 300' with LRAD & prosthesis modified independent for basic community mobility.     Time  3    Period  Months    Status  On-going    Target Date  05/12/18      PT LONG TERM GOAL #5   Title  Patient negotiates ramps, curbs & stairs with LRAD & prosthesis modified independent for community access.     Time  3    Period  Months    Status  On-going    Target Date  05/12/18            Plan - 05/01/18 5956    Clinical Impression Statement  Today's skilled session continued to focus on gait with crutches/cane/prosthesis with less overall assist needed with both devices. Remainder of session addressed balance reactions. No knee or back pain incr reported with session. Pt hopes to have new socket at next visit.     Rehab Potential  Good    PT Frequency  2x / week    PT Duration  Other (comment)   13 weeks (90 days)   PT Treatment/Interventions  ADLs/Self Care Home Management;DME Instruction;Gait training;Stair training;Functional mobility training;Therapeutic activities;Therapeutic exercise;Balance training;Neuromuscular re-education;Patient/family education;Prosthetic Training;Manual techniques;Vestibular    PT Next Visit Plan  continue with crutches for longer distances, cane for shorter distances and balance activities.     Consulted and Agree with Plan of Care  Patient;Family member/caregiver    Family Member Consulted  husband who is deaf       Patient will benefit from skilled therapeutic intervention in order to improve the following deficits and impairments:  Abnormal gait, Decreased activity tolerance, Decreased balance, Cardiopulmonary status limiting activity, Decreased  coordination, Decreased endurance, Decreased knowledge of use of DME, Decreased mobility, Decreased range of motion, Decreased skin integrity, Decreased strength, Dizziness, Impaired flexibility, Increased edema, Obesity, Prosthetic Dependency, Postural dysfunction  Visit Diagnosis: Other abnormalities of gait and mobility  Unsteadiness on feet  Abnormal posture  Muscle weakness (generalized)     Problem List Patient Active Problem List   Diagnosis Date Noted  . Dyslipidemia 11/23/2017  . Below knee amputation status, left 08/31/2017  . Dehiscence of amputation stump ([place]) 08/29/2017  . Infection of amputation stump, left lower extremity ([place]) 06/19/2017  . Bradycardia 05/07/2017  . Depression 05/07/2017  . Lymphedema 05/07/2017  . Charcot foot due to diabetes mellitus ([place]) 02/09/2017  . 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 Post-menopausal bleeding 04/15/2014  . Endometrial polyp 04/09/2014  . Ventral hernia 03/06/2012  .  Open abdominal wall wound 03/06/2012  . Hyperlipemia 11/12/2008  . OBESITY, MORBID 11/12/2008  . Essential hypertension 11/12/2008  . CAD (coronary artery disease) 11/12/2008    Willow Ora, PTA, [facility] [address], [place], [place] [address] [phone] 05/01/18, 1:53 PM   Name: Julia West MRN: [id number] Date of Birth: [birth year]

## 2018-05-04 ENCOUNTER — Ambulatory Visit: Payer: Medicare Other | Admitting: Physical Therapy

## 2018-05-04 ENCOUNTER — Encounter: Payer: Self-pay | Admitting: Physical Therapy

## 2018-05-04 DIAGNOSIS — M6281 Muscle weakness (generalized): Secondary | ICD-10-CM | POA: Diagnosis not present

## 2018-05-04 DIAGNOSIS — M25661 Stiffness of right knee, not elsewhere classified: Secondary | ICD-10-CM | POA: Diagnosis not present

## 2018-05-04 DIAGNOSIS — R293 Abnormal posture: Secondary | ICD-10-CM | POA: Diagnosis not present

## 2018-05-04 DIAGNOSIS — R2689 Other abnormalities of gait and mobility: Secondary | ICD-10-CM | POA: Diagnosis not present

## 2018-05-04 DIAGNOSIS — R2681 Unsteadiness on feet: Secondary | ICD-10-CM | POA: Diagnosis not present

## 2018-05-04 DIAGNOSIS — M25662 Stiffness of left knee, not elsewhere classified: Secondary | ICD-10-CM | POA: Diagnosis not present

## 2018-05-04 NOTE — Therapy (Signed)
[facility] [address] [place], [place], [address] Phone: [phone]   Fax:  [phone]  Physical Therapy Treatment  Patient Details  Name: Julia West MRN: [id number] Date of Birth: [birth year] Referring Provider (PT): Meridee Score, MD   Encounter Date: 05/04/2018  PT End of Session - 05/04/18 0930    Visit Number  24    Number of Visits  26    Date for PT Re-Evaluation  05/12/18    Authorization Type  Medicare & Medicaid     PT Start Time  0930    PT Stop Time  1010    PT Time Calculation (min)  40 min    Equipment Utilized During Treatment  Gait belt    Activity Tolerance  Patient tolerated treatment well    Behavior During Therapy  [facility] for tasks assessed/performed       Past Medical History:  Diagnosis Date  . Acute myocardial infarction    with a ruptured plaque in the circumflex in 2003  . Anxiety   . Cancer ([place])    basal cell face  . Complication of anesthesia    states low O2 sats post-op 11/13, always slow to awaken  . Degenerative joint disease   . 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 Dyspnea    with activity- car to house - steps  . HTN (hypertension)   . Hyperlipidemia LDL goal <70   . Internal and external hemorrhoids without complication   . Lymphedema    right side of body  . Lymphedema of arm    right  . Morbid obesity ([place])   . Surgical wound, non healing ABDOMINAL    has wound vac @ 125 mm Hg    Past Surgical History:  Procedure Laterality Date  . AMPUTATION Left 05/06/2017   Procedure: BELOW KNEE AMPUTATION;  Surgeon: Newt Minion, MD;  Location: [place];  Service: Orthopedics;  Laterality: Left;  . APPLICATION OF WOUND VAC    . BOWEL RESECTION  02/07/2012   Procedure: SMALL BOWEL RESECTION;  Surgeon: Gayland Curry, MD,FACS;  Location: [facility];  Service: General;;  . CARDIOVASCULAR STRESS TEST  01-17-2012  DR HOCHREIN   LOW RISK NUCLEAR TEST  . CESAREAN  SECTION     x 4 in remote past  . CHOLECYSTECTOMY    . CORONARY ARTERY BYPASS GRAFT  2003   by Dr. Servando Snare. LIMA to the LAD, free RIMA to the circumflex. Stress perfusion study December 2009 with no high-risk areas of ischemia. She has a well-preserved ejection fraction  . HYSTEROSCOPY W/D&C N/A 05/14/2014   Procedure: DILATATION AND CURETTAGE (no specimen); HYSTEROSCOPY;  Surgeon: Jonnie Kind, MD;  Location: [facility];  Service: Gynecology;  Laterality: N/A;  . I&D EXTREMITY Left 06/21/2017   Procedure: IRRIGATION AND DEBRIDEMENT OF LEFT LEG AMPUTATION SITE;  Surgeon: Wallace Going, DO;  Location: [facility];  Service: Plastics;  Laterality: Left;  . INCISION AND DRAINAGE OF WOUND  04/17/2012   Procedure: IRRIGATION AND DEBRIDEMENT WOUND;  Surgeon: Theodoro Kos, DO;  Location: [facility];  Service: Plastics;  Laterality: N/A;  OF ABDOMINAL WOUND, SURGICAL PREP AND PLACEMENT OF VAC, REMOVAL FOREHEAD SKIN LESION  . INCISION AND DRAINAGE OF WOUND N/A 04/24/2012   Procedure: IRRIGATION AND DEBRIDEMENT WOUND;  Surgeon: Theodoro Kos, DO;  Location: [facility];  Service: Plastics;  Laterality: N/A;  I & D ABDOMINAL WOUND WITH VAC  AND ACELL  . INCISION AND DRAINAGE OF WOUND N/A 05/01/2012   Procedure: IRRIGATION AND DEBRIDEMENT WOUND;  Surgeon: Theodoro Kos, DO;  Location: [facility];  Service: Plastics;  Laterality: N/A;  WITH SURGICAL PREP AND PLACEMENT OF VAC  . INCISION AND DRAINAGE OF WOUND N/A 05/08/2012   Procedure: IRRIGATION AND DEBRIDEMENT OF ABD WOUND SURGICAL PREP AND PLACEMENT OF VAC ;  Surgeon: Theodoro Kos, DO;  Location: [facility];  Service: Plastics;  Laterality: N/A;  IRRIGATION AND DEBRIDEMENT OF ABD WOUND SURGICAL PREP AND PLACEMENT OF VAC   . INCISION AND DRAINAGE OF WOUND N/A 05/15/2012   Procedure: IRRIGATION AND DEBRIDEMENT OF ABDOMINAL WOUND WITH POSSIBLE SURGICAL PREP AND PLACEMENT OF VAC;  Surgeon: Theodoro Kos, DO;   Location: [facility];  Service: Plastics;  Laterality: N/A;  . INCISION AND DRAINAGE OF WOUND N/A 05/22/2012   Procedure: IRRIGATION AND DEBRIDEMENT OF ABODOMINAL WOUND WITH  SURGICAL PREP AND VAC PLACEMENT;  Surgeon: Theodoro Kos, DO;  Location: [facility];  Service: Plastics;  Laterality: N/A;  . INCISION AND DRAINAGE OF WOUND N/A 06/28/2012   Procedure: IRRIGATION AND DEBRIDEMENT OF ABDOMINAL ULCER SURGICAL PREP AND PLACEMENT OF ACELL AND VAC;  Surgeon: Theodoro Kos, DO;  Location: [facility];  Service: Plastics;  Laterality: N/A;  . INCISIONAL HERNIA REPAIR  02/07/2012   Procedure: HERNIA REPAIR INCISIONAL;  Surgeon: Gayland Curry, MD,FACS;  Location: [facility];  Service: General;;  Open, Primary repair, strangulated Incisional hernia.  Marland Kitchen LESION REMOVAL  04/17/2012   Procedure: LESION REMOVAL;  Surgeon: Theodoro Kos, DO;  Location: [facility];  Service: Plastics;  Laterality: N/A;   CENTER OF FOREHEAD, REMOVAL FORHEAD SKIN LESION  . ORIF TOE FRACTURE Left 02/09/2017   Procedure: EXCISION TALAR HEAD, INTERNAL FIXATION MEDIAL COLUMN LEFT FOOT;  Surgeon: Newt Minion, MD;  Location: [facility];  Service: Orthopedics;  Laterality: Left;  . POLYPECTOMY N/A 05/14/2014   Procedure: ENDOMETRIAL POLYPECTOMY;  Surgeon: Jonnie Kind, MD;  Location: [facility];  Service: Gynecology;  Laterality: N/A;  . STUMP REVISION Left 08/31/2017   Procedure: REVISION LEFT BELOW KNEE AMPUTATION;  Surgeon: Newt Minion, MD;  Location: [facility];  Service: Orthopedics;  Laterality: Left;    There were no vitals filed for this visit.  Subjective Assessment - 05/04/18 0930    Subjective  No falls. She got her crutches & has used crutches in house. She is getting socket later today.    Patient is accompained by:  Family member    Pertinent History  L TTA, bradycardia, depression, lymphedema, DM2, HLD, HTN, CAD, MI, CABG, CA basal cell face, bowel resection,      Limitations  Lifting;Standing;Walking;House hold activities    Patient Stated Goals  To use prosthesis to get in/out cars, bathrooms, access community & home    Currently in Pain?  No/denies                       [facility] Adult PT Treatment/Exercise - 05/04/18 0930      Transfers   Transfers  Sit to Stand;Stand to Sit    Sit to Stand  5: Supervision;With armrests;From chair/3-in-1;With upper extremity assist   to crutches   Sit to Stand Details  Visual cues for safe use of DME/AE;Verbal cues for technique;Verbal cues for safe use of DME/AE    Stand to Sit  5: Supervision;With upper extremity assist;With armrests;To chair/3-in-1  from crutches   Stand to Sit Details (indicate cue type and reason)  Visual cues for safe use of DME/AE;Verbal cues for technique;Verbal cues for safe use of DME/AE      Ambulation/Gait   Ambulation/Gait  Yes    Ambulation/Gait Assistance  5: Supervision    Ambulation/Gait Assistance Details  PT adjusted patient's crutches.     Ambulation Distance (Feet)  210 Feet    Assistive device  Prosthesis;Crutches   cane with rubber quad tip   Gait Pattern  Decreased step length - right;Decreased stance time - left;Right hip hike;Left flexed knee in stance;Right flexed knee in stance;Antalgic;Lateral hip instability;Step-to pattern    Ambulation Surface  Indoor;Level    Ramp  5: Supervision   crutches & prosthesis, 2 reps   Ramp Details (indicate cue type and reason)  demo & verbal cues on technique with axillary crutches & TTA prosthesis.     Curb  5: Supervision   crutches & prosthesis, 2 reps   Curb Details (indicate cue type and reason)  demo & verbal cues on technique with axillary crutches & TTA prosthesis.       High Level Balance   High Level Balance Activities  Side stepping;Backward walking;Marching forwards    High Level Balance Comments  at counter with bil UE support/counter and cane support:  3 laps each with min guard to min HHA for  balance, cues on posture, weight shifting and technique.       Prosthetics   Current prosthetic wear tolerance (days/week)   daily    Current prosthetic wear tolerance (#hours/day)   all awake hours except 1 hr midday.     Residual limb condition   No skin issues.                PT Short Term Goals - 04/10/18 0939      PT SHORT TERM GOAL #1   Title  Patient verbalizes adjusting ply socks to adjust limb volume changes. (All updated STGs Target Date: 04/14/2018)    Baseline  04/10/18: met today    Status  Achieved      PT SHORT TERM GOAL #2   Title  Patient tolerates prosthesis wear >90% of awake hours with PT guidance for skin & limb pain issues.     Baseline  04/10/18: met today    Status  Achieved      PT SHORT TERM GOAL #3   Title  Patient able to stand 2 minutes without UE support with supervision.     Baseline  04/10/18: pt reports she did this 2 sessions ago    Time  --    Period  --    Status  Achieved      PT SHORT TERM GOAL #4   Title  Patient ambulates 200' with RW & prosthesis with supervision.     Baseline  04/10/18: met today    Time  --    Period  --    Status  Achieved      PT SHORT TERM GOAL #5   Title  Patient negotiates ramps & curbs with RW & prosthesis with supervision.     Baseline  04/10/18: met today    Time  --    Period  --    Status  Achieved        PT Long Term Goals - 02/23/18 1135      PT LONG TERM GOAL #1   Title  Patient demonstrates and verbalizes proper prosthetic  care to enable safe use of prosthesis (All LTGs Target Date: 05/12/2018)    Time  3    Period  Months    Status  On-going    Target Date  05/12/18      PT LONG TERM GOAL #2   Title  Patient tolerates wear of prosthesis >90% of awake hours without skin or limb pain issues to enable function throughout her day.     Time  3    Period  Months    Status  On-going    Target Date  05/12/18      PT LONG TERM GOAL #3   Title  Lillie Fragmin >36/56    Time  3    Period   Months    Status  On-going    Target Date  05/12/18      PT LONG TERM GOAL #4   Title  Patient ambulates 300' with LRAD & prosthesis modified independent for basic community mobility.     Time  3    Period  Months    Status  On-going    Target Date  05/12/18      PT LONG TERM GOAL #5   Title  Patient negotiates ramps, curbs & stairs with LRAD & prosthesis modified independent for community access.     Time  3    Period  Months    Status  On-going    Target Date  05/12/18            Plan - 05/04/18 1217    Clinical Impression Statement  Patient improved ability to ambulate with crutches on level surfaces & ramps/curbs. She is getting her socket inplace of temporary check socket that she has been using.     Rehab Potential  Good    PT Frequency  2x / week    PT Duration  Other (comment)   13 weeks (90 days)   PT Treatment/Interventions  ADLs/Self Care Home Management;DME Instruction;Gait training;Stair training;Functional mobility training;Therapeutic activities;Therapeutic exercise;Balance training;Neuromuscular re-education;Patient/family education;Prosthetic Training;Manual techniques;Vestibular    PT Next Visit Plan  Check LTGs & fit of new socket. household activities with cane & community with crutches including outdoors if weather permits.     Consulted and Agree with Plan of Care  Patient;Family member/caregiver    Family Member Consulted  husband who is deaf       Patient will benefit from skilled therapeutic intervention in order to improve the following deficits and impairments:  Abnormal gait, Decreased activity tolerance, Decreased balance, Cardiopulmonary status limiting activity, Decreased coordination, Decreased endurance, Decreased knowledge of use of DME, Decreased mobility, Decreased range of motion, Decreased skin integrity, Decreased strength, Dizziness, Impaired flexibility, Increased edema, Obesity, Prosthetic Dependency, Postural dysfunction  Visit  Diagnosis: Other abnormalities of gait and mobility  Unsteadiness on feet  Abnormal posture  Muscle weakness (generalized)     Problem List Patient Active Problem List   Diagnosis Date Noted  . Dyslipidemia 11/23/2017  . Below knee amputation status, left 08/31/2017  . Dehiscence of amputation stump ([place]) 08/29/2017  . Infection of amputation stump, left lower extremity ([place]) 06/19/2017  . Bradycardia 05/07/2017  . Depression 05/07/2017  . Lymphedema 05/07/2017  . Charcot foot due to diabetes mellitus ([place]) 02/09/2017  . 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 Post-menopausal bleeding 04/15/2014  . Endometrial polyp 04/09/2014  . Ventral hernia 03/06/2012  . Open abdominal wall wound 03/06/2012  . Hyperlipemia 11/12/2008  . OBESITY,  MORBID 11/12/2008  . Essential hypertension 11/12/2008  . CAD (coronary artery disease) 11/12/2008    Jamey Reas PT, DPT 05/04/2018, 12:20 PM  [facility] [address] [place], [place], [address] Phone: [phone]   Fax:  [phone]  Name: Julia West MRN: [id number] Date of Birth: 06-Aug-1955

## 2018-05-05 ENCOUNTER — Encounter: Payer: Self-pay | Admitting: *Deleted

## 2018-05-08 ENCOUNTER — Encounter: Payer: Self-pay | Admitting: Physical Therapy

## 2018-05-08 ENCOUNTER — Ambulatory Visit: Payer: Medicare Other | Admitting: Physical Therapy

## 2018-05-08 DIAGNOSIS — R2689 Other abnormalities of gait and mobility: Secondary | ICD-10-CM

## 2018-05-08 DIAGNOSIS — M6281 Muscle weakness (generalized): Secondary | ICD-10-CM

## 2018-05-08 DIAGNOSIS — R293 Abnormal posture: Secondary | ICD-10-CM | POA: Diagnosis not present

## 2018-05-08 DIAGNOSIS — M25662 Stiffness of left knee, not elsewhere classified: Secondary | ICD-10-CM | POA: Diagnosis not present

## 2018-05-08 DIAGNOSIS — M25661 Stiffness of right knee, not elsewhere classified: Secondary | ICD-10-CM | POA: Diagnosis not present

## 2018-05-08 DIAGNOSIS — R2681 Unsteadiness on feet: Secondary | ICD-10-CM | POA: Diagnosis not present

## 2018-05-08 NOTE — Therapy (Signed)
[facility] [address] [place], [place], [address] Phone: [phone]   Fax:  [phone]  Physical Therapy Treatment  Patient Details  Name: Julia West MRN: [id number] Date of Birth: 06-21-1955 Referring Provider (PT): Meridee Score, MD   Encounter Date: 05/08/2018  PT End of Session - 05/08/18 1103    Visit Number  25    Number of Visits  26    Date for PT Re-Evaluation  05/12/18    Authorization Type  Medicare & Medicaid     PT Start Time  9628    PT Stop Time  1100    PT Time Calculation (min)  45 min    Equipment Utilized During Treatment  Gait belt    Activity Tolerance  Patient tolerated treatment well    Behavior During Therapy  [facility] for tasks assessed/performed       Past Medical History:  Diagnosis Date  . Acute myocardial infarction    with a ruptured plaque in the circumflex in 2003  . Anxiety   . Cancer ([place])    basal cell face  . Complication of anesthesia    states low O2 sats post-op 11/13, always slow to awaken  . Degenerative joint disease   . 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 Dyspnea    with activity- car to house - steps  . HTN (hypertension)   . Hyperlipidemia LDL goal <70   . Internal and external hemorrhoids without complication   . Lymphedema    right side of body  . Lymphedema of arm    right  . Morbid obesity ([place])   . Surgical wound, non healing ABDOMINAL    has wound vac @ 125 mm Hg    Past Surgical History:  Procedure Laterality Date  . AMPUTATION Left 05/06/2017   Procedure: BELOW KNEE AMPUTATION;  Surgeon: Newt Minion, MD;  Location: [facility];  Service: Orthopedics;  Laterality: Left;  . APPLICATION OF WOUND VAC    . BOWEL RESECTION  02/07/2012   Procedure: SMALL BOWEL RESECTION;  Surgeon: Gayland Curry, MD,FACS;  Location: [facility];  Service: General;;  . CARDIOVASCULAR STRESS TEST  01-17-2012  DR HOCHREIN   LOW RISK NUCLEAR TEST  . CESAREAN  SECTION     x 4 in remote past  . CHOLECYSTECTOMY    . CORONARY ARTERY BYPASS GRAFT  2003   by Dr. Servando Snare. LIMA to the LAD, free RIMA to the circumflex. Stress perfusion study December 2009 with no high-risk areas of ischemia. She has a well-preserved ejection fraction  . HYSTEROSCOPY W/D&C N/A 05/14/2014   Procedure: DILATATION AND CURETTAGE (no specimen); HYSTEROSCOPY;  Surgeon: Jonnie Kind, MD;  Location: [facility];  Service: Gynecology;  Laterality: N/A;  . I&D EXTREMITY Left 06/21/2017   Procedure: IRRIGATION AND DEBRIDEMENT OF LEFT LEG AMPUTATION SITE;  Surgeon: Wallace Going, DO;  Location: [facility];  Service: Plastics;  Laterality: Left;  . INCISION AND DRAINAGE OF WOUND  04/17/2012   Procedure: IRRIGATION AND DEBRIDEMENT WOUND;  Surgeon: Theodoro Kos, DO;  Location: [facility];  Service: Plastics;  Laterality: N/A;  OF ABDOMINAL WOUND, SURGICAL PREP AND PLACEMENT OF VAC, REMOVAL FOREHEAD SKIN LESION  . INCISION AND DRAINAGE OF WOUND N/A 04/24/2012   Procedure: IRRIGATION AND DEBRIDEMENT WOUND;  Surgeon: Theodoro Kos, DO;  Location: [facility];  Service: Plastics;  Laterality: N/A;  I & D ABDOMINAL WOUND WITH VAC  AND ACELL  . INCISION AND DRAINAGE OF WOUND N/A 05/01/2012   Procedure: IRRIGATION AND DEBRIDEMENT WOUND;  Surgeon: Theodoro Kos, DO;  Location: [facility];  Service: Plastics;  Laterality: N/A;  WITH SURGICAL PREP AND PLACEMENT OF VAC  . INCISION AND DRAINAGE OF WOUND N/A 05/08/2012   Procedure: IRRIGATION AND DEBRIDEMENT OF ABD WOUND SURGICAL PREP AND PLACEMENT OF VAC ;  Surgeon: Theodoro Kos, DO;  Location: [facility];  Service: Plastics;  Laterality: N/A;  IRRIGATION AND DEBRIDEMENT OF ABD WOUND SURGICAL PREP AND PLACEMENT OF VAC   . INCISION AND DRAINAGE OF WOUND N/A 05/15/2012   Procedure: IRRIGATION AND DEBRIDEMENT OF ABDOMINAL WOUND WITH POSSIBLE SURGICAL PREP AND PLACEMENT OF VAC;  Surgeon: Theodoro Kos, DO;   Location: [facility];  Service: Plastics;  Laterality: N/A;  . INCISION AND DRAINAGE OF WOUND N/A 05/22/2012   Procedure: IRRIGATION AND DEBRIDEMENT OF ABODOMINAL WOUND WITH  SURGICAL PREP AND VAC PLACEMENT;  Surgeon: Theodoro Kos, DO;  Location: [facility];  Service: Plastics;  Laterality: N/A;  . INCISION AND DRAINAGE OF WOUND N/A 06/28/2012   Procedure: IRRIGATION AND DEBRIDEMENT OF ABDOMINAL ULCER SURGICAL PREP AND PLACEMENT OF ACELL AND VAC;  Surgeon: Theodoro Kos, DO;  Location: [facility];  Service: Plastics;  Laterality: N/A;  . INCISIONAL HERNIA REPAIR  02/07/2012   Procedure: HERNIA REPAIR INCISIONAL;  Surgeon: Gayland Curry, MD,FACS;  Location: [facility];  Service: General;;  Open, Primary repair, strangulated Incisional hernia.  Marland Kitchen LESION REMOVAL  04/17/2012   Procedure: LESION REMOVAL;  Surgeon: Theodoro Kos, DO;  Location: [facility];  Service: Plastics;  Laterality: N/A;   CENTER OF FOREHEAD, REMOVAL FORHEAD SKIN LESION  . ORIF TOE FRACTURE Left 02/09/2017   Procedure: EXCISION TALAR HEAD, INTERNAL FIXATION MEDIAL COLUMN LEFT FOOT;  Surgeon: Newt Minion, MD;  Location: [facility];  Service: Orthopedics;  Laterality: Left;  . POLYPECTOMY N/A 05/14/2014   Procedure: ENDOMETRIAL POLYPECTOMY;  Surgeon: Jonnie Kind, MD;  Location: [facility];  Service: Gynecology;  Laterality: N/A;  . STUMP REVISION Left 08/31/2017   Procedure: REVISION LEFT BELOW KNEE AMPUTATION;  Surgeon: Newt Minion, MD;  Location: [facility];  Service: Orthopedics;  Laterality: Left;    There were no vitals filed for this visit.  Subjective Assessment - 05/08/18 1015    Subjective  She got her socket Thursday. She used crutches with family in community.     Patient is accompained by:  Family member    Pertinent History  L TTA, bradycardia, depression, lymphedema, DM2, HLD, HTN, CAD, MI, CABG, CA basal cell face, bowel resection,     Limitations   Lifting;Standing;Walking;House hold activities    Patient Stated Goals  To use prosthesis to get in/out cars, bathrooms, access community & home    Currently in Pain?  No/denies          Prosthetics Assessment - 05/08/18 1015      Prosthetics   Prosthetic Care Independent with  Skin check;Residual limb care;Care of non-amputated limb;Prosthetic cleaning;Ply sock cleaning;Correct ply sock adjustment;Proper wear schedule/adjustment;Proper weight-bearing schedule/adjustment    Donning prosthesis   Modified independent (Device/Increase time)    Doffing prosthesis   Modified independent (Device/Increase time)    K code/activity level with prosthetic use   K2 limited community mobility                  [facility] Adult PT Treatment/Exercise - 05/08/18 1015  Transfers   Transfers  Sit to Stand;Stand to Sit    Sit to Stand  6: Modified independent (Device/Increase time);With upper extremity assist;With armrests;From chair/3-in-1   to crutches   Stand to Sit  6: Modified independent (Device/Increase time);With upper extremity assist;With armrests;To chair/3-in-1   from crutches     Ambulation/Gait   Ambulation/Gait  Yes    Ambulation/Gait Assistance  6: Modified independent (Device/Increase time)    Ambulation Distance (Feet)  350 Feet    Assistive device  Prosthesis;Crutches    Gait Pattern  Step-through pattern;Decreased step length - right;Decreased stance time - left;Left hip hike;Antalgic;Wide base of support    Ambulation Surface  Indoor;Level    Stairs  Yes    Stairs Assistance  6: Modified independent (Device/Increase time)    Stair Management Technique  One rail Right;With crutches;Step to pattern;Forwards    Number of Stairs  4    Ramp  6: Modified independent (Device)   crutches & prosthesis   Curb  6: Modified independent (Device/increase time)   crutches & prosthesis     Prosthetics   Current prosthetic wear tolerance (days/week)   daily    Current  prosthetic wear tolerance (#hours/day)   all awake hours    Residual limb condition   No skin issues.              PT Education - 05/08/18 1104    Education Details  returning to driving with starting in parking lot and walking in/out of stores with electric carts    Person(s) Educated  Patient    Methods  Explanation    Comprehension  Verbalized understanding       PT Short Term Goals - 04/10/18 0939      PT SHORT TERM GOAL #1   Title  Patient verbalizes adjusting ply socks to adjust limb volume changes. (All updated STGs Target Date: 04/14/2018)    Baseline  04/10/18: met today    Status  Achieved      PT SHORT TERM GOAL #2   Title  Patient tolerates prosthesis wear >90% of awake hours with PT guidance for skin & limb pain issues.     Baseline  04/10/18: met today    Status  Achieved      PT SHORT TERM GOAL #3   Title  Patient able to stand 2 minutes without UE support with supervision.     Baseline  04/10/18: pt reports she did this 2 sessions ago    Time  --    Period  --    Status  Achieved      PT SHORT TERM GOAL #4   Title  Patient ambulates 200' with RW & prosthesis with supervision.     Baseline  04/10/18: met today    Time  --    Period  --    Status  Achieved      PT SHORT TERM GOAL #5   Title  Patient negotiates ramps & curbs with RW & prosthesis with supervision.     Baseline  04/10/18: met today    Time  --    Period  --    Status  Achieved        PT Long Term Goals - 05/08/18 1244      PT LONG TERM GOAL #1   Title  Patient demonstrates and verbalizes proper prosthetic care to enable safe use of prosthesis (All LTGs Target Date: 05/12/2018)    Baseline  MET 05/08/2018  Time  3    Period  Months    Status  Achieved      PT LONG TERM GOAL #2   Title  Patient tolerates wear of prosthesis >90% of awake hours without skin or limb pain issues to enable function throughout her day.     Baseline  MET 05/08/2018    Time  3    Period  Months    Status   Achieved      PT LONG TERM GOAL #3   Title  Lillie Fragmin >36/56    Time  3    Period  Months    Status  On-going    Target Date  05/12/18      PT LONG TERM GOAL #4   Title  Patient ambulates 300' with LRAD & prosthesis modified independent for basic community mobility.     Baseline  MET 05/08/2018    Time  3    Period  Months    Status  Achieved      PT LONG TERM GOAL #5   Title  Patient negotiates ramps, curbs & stairs with LRAD & prosthesis modified independent for community access.     Baseline  MET 05/08/2018    Time  3    Period  Months    Status  Achieved            Plan - 05/08/18 1246    Clinical Impression Statement  Patient met 4 long term goals checked today with remianing goal to be checked last session. Pt recieved new socket last week and no changes to skin integrity. She is using her crutches for community outings with family and understands PT recommendations to progress to less dependency on husband /family for community outings.     Rehab Potential  Good    PT Frequency  2x / week    PT Duration  Other (comment)   13 weeks (90 days)   PT Treatment/Interventions  ADLs/Self Care Home Management;DME Instruction;Gait training;Stair training;Functional mobility training;Therapeutic activities;Therapeutic exercise;Balance training;Neuromuscular re-education;Patient/family education;Prosthetic Training;Manual techniques;Vestibular    PT Next Visit Plan  check remaining LTG and discharge.     Consulted and Agree with Plan of Care  Patient;Family member/caregiver    Family Member Consulted  husband who is deaf       Patient will benefit from skilled therapeutic intervention in order to improve the following deficits and impairments:  Abnormal gait, Decreased activity tolerance, Decreased balance, Cardiopulmonary status limiting activity, Decreased coordination, Decreased endurance, Decreased knowledge of use of DME, Decreased mobility, Decreased range of motion,  Decreased skin integrity, Decreased strength, Dizziness, Impaired flexibility, Increased edema, Obesity, Prosthetic Dependency, Postural dysfunction  Visit Diagnosis: Other abnormalities of gait and mobility  Unsteadiness on feet  Abnormal posture  Muscle weakness (generalized)     Problem List Patient Active Problem List   Diagnosis Date Noted  . Dyslipidemia 11/23/2017  . Below knee amputation status, left 08/31/2017  . Dehiscence of amputation stump ([facility]) 08/29/2017  . Infection of amputation stump, left lower extremity ([facility]) 06/19/2017  . Bradycardia 05/07/2017  . Depression 05/07/2017  . Lymphedema 05/07/2017  . Charcot foot due to diabetes mellitus ([facility]) 02/09/2017  . 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 Post-menopausal bleeding 04/15/2014  . Endometrial polyp 04/09/2014  . Ventral hernia 03/06/2012  . Open abdominal wall wound 03/06/2012  . Hyperlipemia 11/12/2008  . OBESITY, MORBID 11/12/2008  . Essential hypertension 11/12/2008  . CAD (coronary  artery disease) 11/12/2008    Jamey Reas PT, DPT 05/08/2018, 12:51 PM  [facility] [address] [place], [place], [address] Phone: [phone]   Fax:  [phone]  Name: Julia West MRN: [id number] Date of Birth: 14-Aug-1955

## 2018-05-11 ENCOUNTER — Ambulatory Visit: Payer: Medicare Other | Admitting: Physical Therapy

## 2018-05-11 ENCOUNTER — Encounter: Payer: Self-pay | Admitting: Physical Therapy

## 2018-05-11 DIAGNOSIS — M25661 Stiffness of right knee, not elsewhere classified: Secondary | ICD-10-CM | POA: Diagnosis not present

## 2018-05-11 DIAGNOSIS — R2681 Unsteadiness on feet: Secondary | ICD-10-CM | POA: Diagnosis not present

## 2018-05-11 DIAGNOSIS — R293 Abnormal posture: Secondary | ICD-10-CM | POA: Diagnosis not present

## 2018-05-11 DIAGNOSIS — M6281 Muscle weakness (generalized): Secondary | ICD-10-CM

## 2018-05-11 DIAGNOSIS — R2689 Other abnormalities of gait and mobility: Secondary | ICD-10-CM

## 2018-05-11 DIAGNOSIS — M25662 Stiffness of left knee, not elsewhere classified: Secondary | ICD-10-CM | POA: Diagnosis not present

## 2018-05-11 NOTE — Therapy (Addendum)
[facility] [address] [place], [place], [address] Phone: [phone]   Fax:  [phone]  Physical Therapy Treatment & Discharge Summary  Patient Details  Name: Julia West MRN: [id number] Date of Birth: 10-Sep-1955 Referring Provider (PT): Meridee Score, MD   Encounter Date: 05/11/2018   PHYSICAL THERAPY DISCHARGE SUMMARY  Visits from Start of Care: 26  Current functional level related to goals / functional outcomes: See below   Remaining deficits: See below   Education / Equipment: Prosthetic care & HEP. Patient purchased crutches.   Plan: Patient agrees to discharge.  Patient goals were met. Patient is being discharged due to meeting the stated rehab goals.  ?????         Jamey Reas, PT, DPT PT Specializing in [place] 05/11/18 5:33 PM Phone:  [phone]  Fax:  [phone] [facility] [address] [place], [place] [address]   PT End of Session - 05/11/18 3570    Visit Number  26    Number of Visits  26    Date for PT Re-Evaluation  05/12/18    Authorization Type  Medicare & Medicaid     PT Start Time  0932    PT Stop Time  1779    PT Time Calculation (min)  42 min    Equipment Utilized During Treatment  Gait belt    Activity Tolerance  Patient tolerated treatment well    Behavior During Therapy  WFL for tasks assessed/performed       Past Medical History:  Diagnosis Date  . Acute myocardial infarction    with a ruptured plaque in the circumflex in 2003  . Anxiety   . 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 Complication of anesthesia    states low O2 sats post-op 11/13, always slow to awaken  . Degenerative joint disease   . 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 Dyspnea    with activity- car to house - steps  . HTN (hypertension)   . Hyperlipidemia LDL goal <70   . Internal and external hemorrhoids without complication    . Lymphedema    right side of body  . Lymphedema of arm    right  . Morbid obesity ([place])   . Surgical wound, non healing ABDOMINAL    has wound vac @ 125 mm Hg    Past Surgical History:  Procedure Laterality Date  . AMPUTATION Left 05/06/2017   Procedure: BELOW KNEE AMPUTATION;  Surgeon: Newt Minion, MD;  Location: [facility];  Service: Orthopedics;  Laterality: Left;  . APPLICATION OF WOUND VAC    . BOWEL RESECTION  02/07/2012   Procedure: SMALL BOWEL RESECTION;  Surgeon: Gayland Curry, MD,FACS;  Location: [facility];  Service: General;;  . CARDIOVASCULAR STRESS TEST  01-17-2012  DR HOCHREIN   LOW RISK NUCLEAR TEST  . CESAREAN SECTION     x 4 in remote past  . CHOLECYSTECTOMY    . CORONARY ARTERY BYPASS GRAFT  2003   by Dr. Servando Snare. LIMA to the LAD, free RIMA to the circumflex. Stress perfusion study December 2009 with no high-risk areas of ischemia. She has a well-preserved ejection fraction  . HYSTEROSCOPY W/D&C N/A 05/14/2014   Procedure: DILATATION AND CURETTAGE (no specimen); HYSTEROSCOPY;  Surgeon: Jonnie Kind, MD;  Location: [facility];  Service: Gynecology;  Laterality: N/A;  . I&D EXTREMITY Left 06/21/2017  Procedure: IRRIGATION AND DEBRIDEMENT OF LEFT LEG AMPUTATION SITE;  Surgeon: Wallace Going, DO;  Location: [facility];  Service: Plastics;  Laterality: Left;  . INCISION AND DRAINAGE OF WOUND  04/17/2012   Procedure: IRRIGATION AND DEBRIDEMENT WOUND;  Surgeon: Theodoro Kos, DO;  Location: [facility];  Service: Plastics;  Laterality: N/A;  OF ABDOMINAL WOUND, SURGICAL PREP AND PLACEMENT OF VAC, REMOVAL FOREHEAD SKIN LESION  . INCISION AND DRAINAGE OF WOUND N/A 04/24/2012   Procedure: IRRIGATION AND DEBRIDEMENT WOUND;  Surgeon: Theodoro Kos, DO;  Location: [facility];  Service: Plastics;  Laterality: N/A;  I & D ABDOMINAL WOUND WITH VAC AND ACELL  . INCISION AND DRAINAGE OF WOUND N/A 05/01/2012   Procedure: IRRIGATION AND DEBRIDEMENT WOUND;   Surgeon: Theodoro Kos, DO;  Location: [facility];  Service: Plastics;  Laterality: N/A;  WITH SURGICAL PREP AND PLACEMENT OF VAC  . INCISION AND DRAINAGE OF WOUND N/A 05/08/2012   Procedure: IRRIGATION AND DEBRIDEMENT OF ABD WOUND SURGICAL PREP AND PLACEMENT OF VAC ;  Surgeon: Theodoro Kos, DO;  Location: [facility];  Service: Plastics;  Laterality: N/A;  IRRIGATION AND DEBRIDEMENT OF ABD WOUND SURGICAL PREP AND PLACEMENT OF VAC   . INCISION AND DRAINAGE OF WOUND N/A 05/15/2012   Procedure: IRRIGATION AND DEBRIDEMENT OF ABDOMINAL WOUND WITH POSSIBLE SURGICAL PREP AND PLACEMENT OF VAC;  Surgeon: Theodoro Kos, DO;  Location: [facility];  Service: Plastics;  Laterality: N/A;  . INCISION AND DRAINAGE OF WOUND N/A 05/22/2012   Procedure: IRRIGATION AND DEBRIDEMENT OF ABODOMINAL WOUND WITH  SURGICAL PREP AND VAC PLACEMENT;  Surgeon: Theodoro Kos, DO;  Location: [facility];  Service: Plastics;  Laterality: N/A;  . INCISION AND DRAINAGE OF WOUND N/A 06/28/2012   Procedure: IRRIGATION AND DEBRIDEMENT OF ABDOMINAL ULCER SURGICAL PREP AND PLACEMENT OF ACELL AND VAC;  Surgeon: Theodoro Kos, DO;  Location: [facility];  Service: Plastics;  Laterality: N/A;  . INCISIONAL HERNIA REPAIR  02/07/2012   Procedure: HERNIA REPAIR INCISIONAL;  Surgeon: Gayland Curry, MD,FACS;  Location: [facility];  Service: General;;  Open, Primary repair, strangulated Incisional hernia.  Marland Kitchen LESION REMOVAL  04/17/2012   Procedure: LESION REMOVAL;  Surgeon: Theodoro Kos, DO;  Location: [facility];  Service: Plastics;  Laterality: N/A;   CENTER OF FOREHEAD, REMOVAL FORHEAD SKIN LESION  . ORIF TOE FRACTURE Left 02/09/2017   Procedure: EXCISION TALAR HEAD, INTERNAL FIXATION MEDIAL COLUMN LEFT FOOT;  Surgeon: Newt Minion, MD;  Location: [facility];  Service: Orthopedics;  Laterality: Left;  . POLYPECTOMY N/A 05/14/2014   Procedure: ENDOMETRIAL POLYPECTOMY;   Surgeon: Jonnie Kind, MD;  Location: [facility];  Service: Gynecology;  Laterality: N/A;  . STUMP REVISION Left 08/31/2017   Procedure: REVISION LEFT BELOW KNEE AMPUTATION;  Surgeon: Newt Minion, MD;  Location: [facility];  Service: Orthopedics;  Laterality: Left;    There were no vitals filed for this visit.  Subjective Assessment - 05/11/18 0936    Subjective  No new complaints. No falls or pain to report.     Patient is accompained by:  Family member    Pertinent History  L TTA, bradycardia, depression, lymphedema, DM2, HLD, HTN, CAD, MI, CABG, CA basal cell face, bowel resection,     Limitations  Lifting;Standing;Walking;House hold activities    Patient Stated Goals  To use prosthesis to get in/out cars, bathrooms, access community & home    Currently  in Pain?  No/denies             [facility] Adult PT Treatment/Exercise - 05/11/18 0939      Transfers   Transfers  Sit to Stand;Stand to Sit    Sit to Stand  6: Modified independent (Device/Increase time);With upper extremity assist;With armrests;From chair/3-in-1    Stand to Sit  6: Modified independent (Device/Increase time);With upper extremity assist;With armrests;To chair/3-in-1      Ambulation/Gait   Ambulation/Gait  Yes    Ambulation/Gait Assistance  6: Modified independent (Device/Increase time)    Ambulation Distance (Feet)  220 Feet   x1   Assistive device  Prosthesis;Crutches    Gait Pattern  Step-through pattern;Decreased step length - right;Decreased stance time - left;Left hip hike;Antalgic;Wide base of support    Ambulation Surface  Level;Indoor      Standardized Balance Assessment   Standardized Balance Assessment  Berg Balance Test      Berg Balance Test   Sit to Stand  Able to stand  independently using hands    Standing Unsupported  Able to stand safely 2 minutes    Sitting with Back Unsupported but Feet Supported on Floor or Stool  Able to sit safely and securely 2 minutes    Stand to Sit  Sits safely with  minimal use of hands    Transfers  Able to transfer safely, definite need of hands    Standing Unsupported with Eyes Closed  Able to stand 10 seconds safely    Standing Ubsupported with Feet Together  Able to place feet together independently and stand 1 minute safely    From Standing, Reach Forward with Outstretched Arm  Can reach forward >12 cm safely (5")   5 inches   From Standing Position, Pick up Object from Floor  Able to pick up shoe safely and easily    From Standing Position, Turn to Look Behind Over each Shoulder  Looks behind one side only/other side shows less weight shift   left > right   Turn 360 Degrees  Needs assistance while turning    Standing Unsupported, Alternately Place Feet on Step/Stool  Able to complete >2 steps/needs minimal assist    Standing Unsupported, One Foot in Front  Able to take small step independently and hold 30 seconds    Standing on One Leg  Unable to try or needs assist to prevent fall    Total Score  39      Prosthetics   Current prosthetic wear tolerance (days/week)   daily    Current prosthetic wear tolerance (#hours/day)   all awake hours    Residual limb condition   No skin issues.                PT Short Term Goals - 04/10/18 0939      PT SHORT TERM GOAL #1   Title  Patient verbalizes adjusting ply socks to adjust limb volume changes. (All updated STGs Target Date: 04/14/2018)    Baseline  04/10/18: met today    Status  Achieved      PT SHORT TERM GOAL #2   Title  Patient tolerates prosthesis wear >90% of awake hours with PT guidance for skin & limb pain issues.     Baseline  04/10/18: met today    Status  Achieved      PT SHORT TERM GOAL #3   Title  Patient able to stand 2 minutes without UE support with supervision.     Baseline  04/10/18: pt reports she did this 2 sessions ago    Time  --    Period  --    Status  Achieved      PT SHORT TERM GOAL #4   Title  Patient ambulates 200' with RW & prosthesis with supervision.      Baseline  04/10/18: met today    Time  --    Period  --    Status  Achieved      PT SHORT TERM GOAL #5   Title  Patient negotiates ramps & curbs with RW & prosthesis with supervision.     Baseline  04/10/18: met today    Time  --    Period  --    Status  Achieved        PT Long Term Goals - 05/11/18 3785      PT LONG TERM GOAL #1   Title  Patient demonstrates and verbalizes proper prosthetic care to enable safe use of prosthesis (All LTGs Target Date: 05/12/2018)    Baseline  MET 05/08/2018    Status  Achieved      PT LONG TERM GOAL #2   Title  Patient tolerates wear of prosthesis >90% of awake hours without skin or limb pain issues to enable function throughout her day.     Baseline  MET 05/08/2018    Status  Achieved      PT LONG TERM GOAL #3   Title  Lillie Fragmin >36/56    Baseline  05/11/18: 39/56 scored today    Time  --    Period  --    Status  Achieved      PT LONG TERM GOAL #4   Title  Patient ambulates 300' with LRAD & prosthesis modified independent for basic community mobility.     Baseline  MET 05/08/2018    Time  --    Period  --    Status  Achieved      PT LONG TERM GOAL #5   Title  Patient negotiates ramps, curbs & stairs with LRAD & prosthesis modified independent for community access.     Baseline  MET 05/08/2018    Time  --    Period  --    Status  Achieved            Plan - 05/11/18 8850    Clinical Impression Statement  Pt met remaining LTG today. Pt/spouse without any questions and in agreement to discharge today.     Rehab Potential  Good    PT Frequency  2x / week    PT Duration  Other (comment)   13 weeks (90 days)   PT Treatment/Interventions  ADLs/Self Care Home Management;DME Instruction;Gait training;Stair training;Functional mobility training;Therapeutic activities;Therapeutic exercise;Balance training;Neuromuscular re-education;Patient/family education;Prosthetic Training;Manual techniques;Vestibular    PT Next Visit Plan   discharge per PT plan of care    Consulted and Agree with Plan of Care  Patient;Family member/caregiver    Family Member Consulted  husband who is deaf       Patient will benefit from skilled therapeutic intervention in order to improve the following deficits and impairments:  Abnormal gait, Decreased activity tolerance, Decreased balance, Cardiopulmonary status limiting activity, Decreased coordination, Decreased endurance, Decreased knowledge of use of DME, Decreased mobility, Decreased range of motion, Decreased skin integrity, Decreased strength, Dizziness, Impaired flexibility, Increased edema, Obesity, Prosthetic Dependency, Postural dysfunction  Visit Diagnosis: Other abnormalities of gait and mobility  Unsteadiness on feet  Abnormal posture  Muscle weakness (generalized)     Problem List Patient Active Problem List   Diagnosis Date Noted  . Dyslipidemia 11/23/2017  . Below knee amputation status, left 08/31/2017  . Dehiscence of amputation stump ([facility]) 08/29/2017  . Infection of amputation stump, left lower extremity ([facility]) 06/19/2017  . Bradycardia 05/07/2017  . Depression 05/07/2017  . Lymphedema 05/07/2017  . Charcot foot due to diabetes mellitus ([place]) 02/09/2017  . 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 Post-menopausal bleeding 04/15/2014  . Endometrial polyp 04/09/2014  . Ventral hernia 03/06/2012  . Open abdominal wall wound 03/06/2012  . Hyperlipemia 11/12/2008  . OBESITY, MORBID 11/12/2008  . Essential hypertension 11/12/2008  . CAD (coronary artery disease) 11/12/2008    Willow Ora, PTA, [facility] [address] [place], [place] [address] [phone] 05/11/18, 11:04 AM   Name: LILLIA LENGEL MRN: [id number] Date of Birth: [birth year]

## 2018-08-28 ENCOUNTER — Other Ambulatory Visit: Payer: Self-pay | Admitting: Family Medicine

## 2018-08-28 NOTE — Telephone Encounter (Signed)
Last office visit 04/11/2018, has upcoming appointment in July.

## 2018-09-09 ENCOUNTER — Other Ambulatory Visit: Payer: Self-pay | Admitting: Family Medicine

## 2018-09-09 DIAGNOSIS — E785 Hyperlipidemia, unspecified: Secondary | ICD-10-CM

## 2018-09-12 IMAGING — CR DG KNEE COMPLETE 4+V*L*
4 series · 4 of 4 positions shown · non-contrast
Comparison: None.

CLINICAL DATA: Below the knee amputation in [REDACTED]. Worsening
pain, redness and bleeding.

EXAM:
LEFT KNEE - COMPLETE 4+ VIEW

[x knee ap left (1 of 2)]
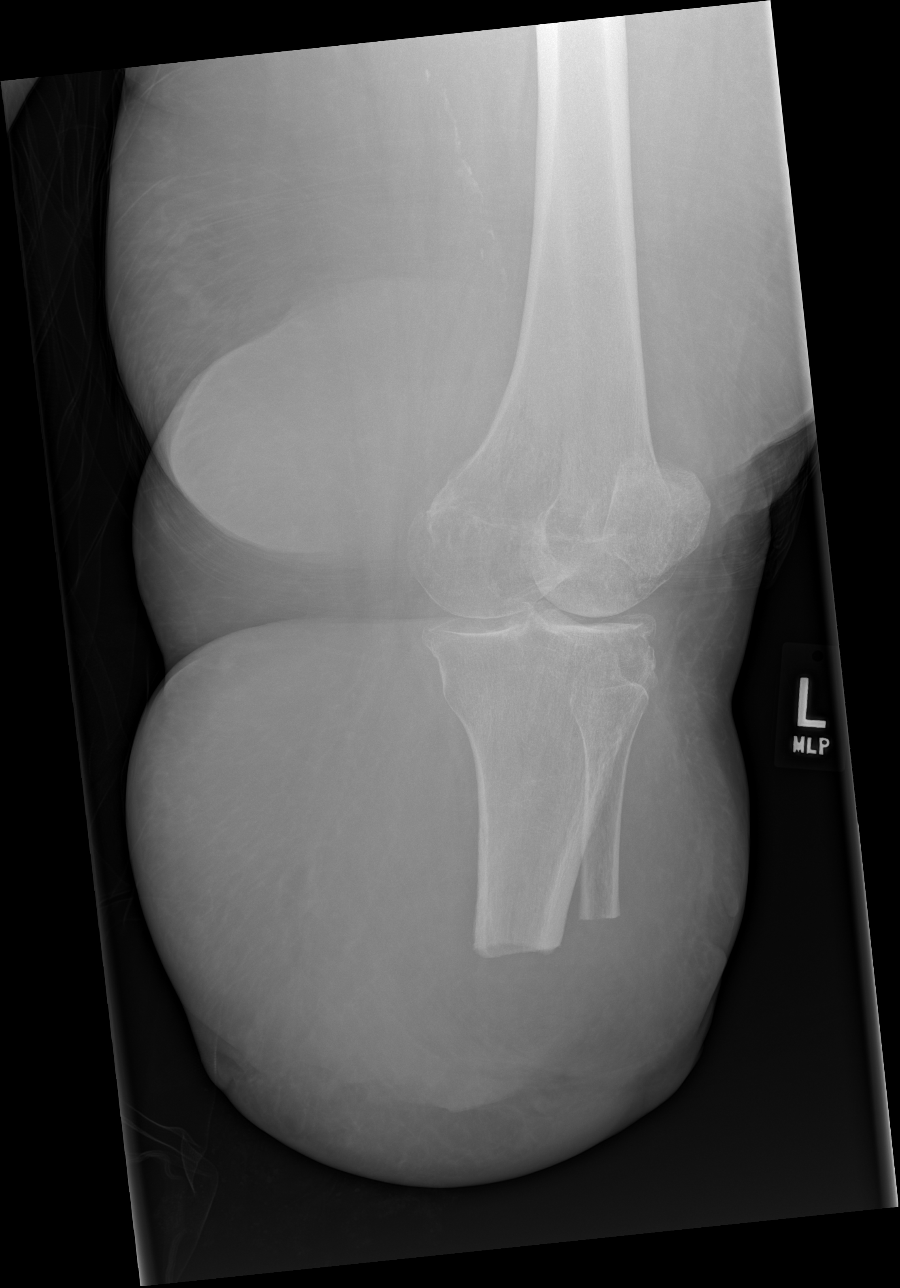

[x knee ap left (2 of 2)]
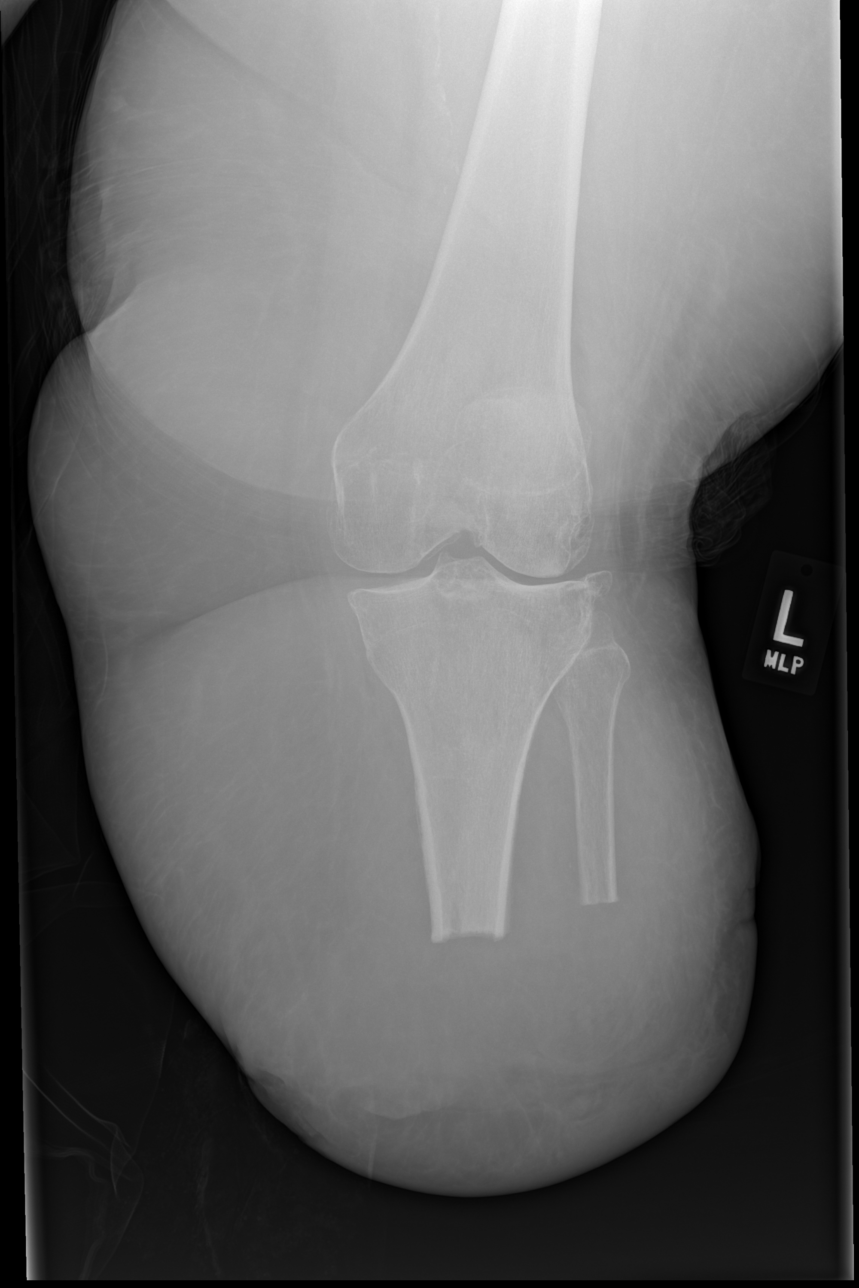

[x knee obl left]
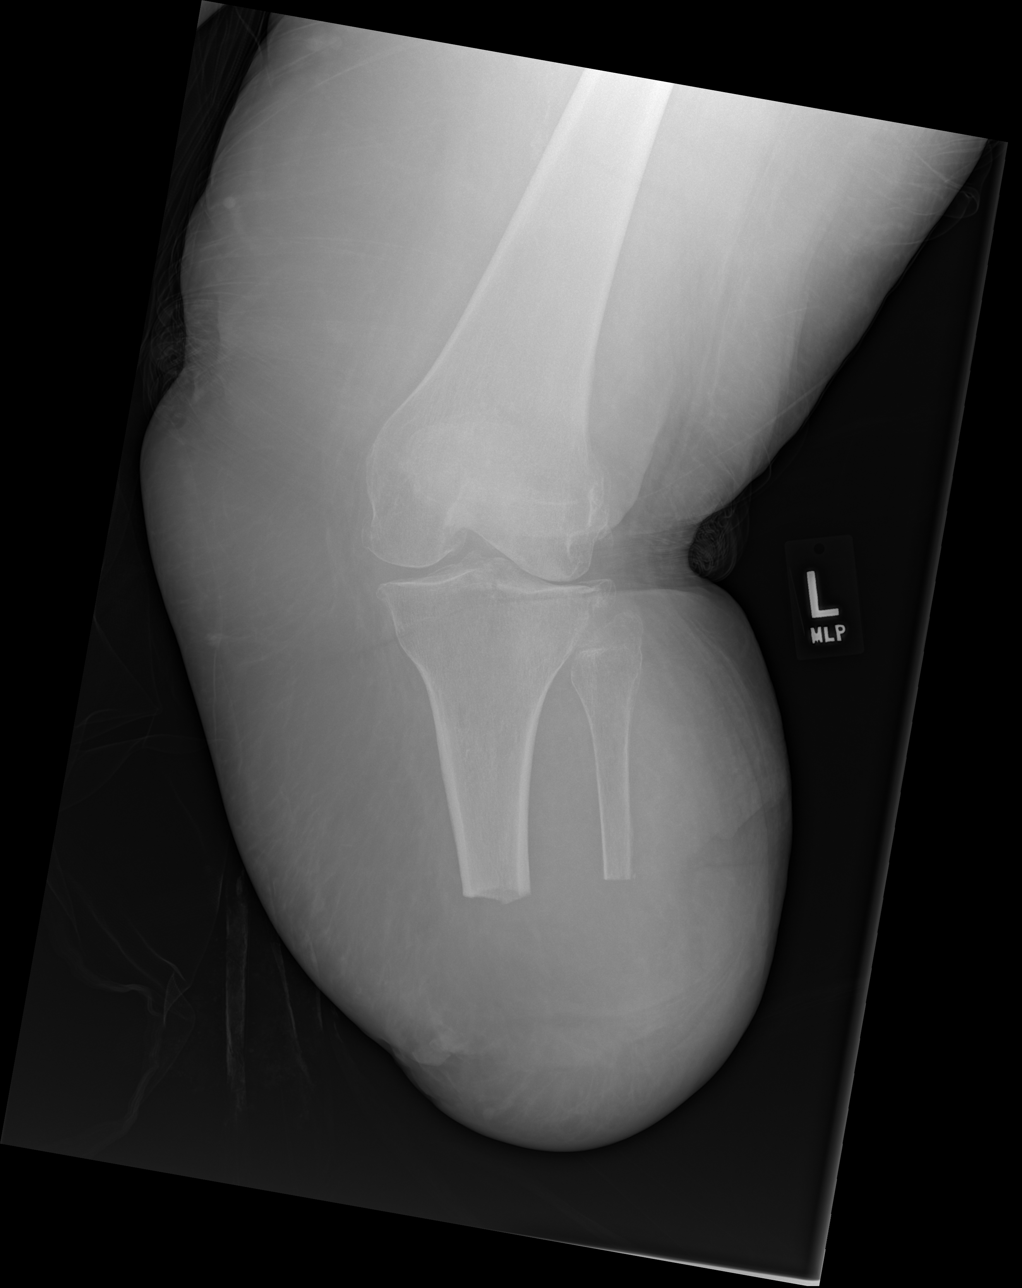

[x knee lat left]
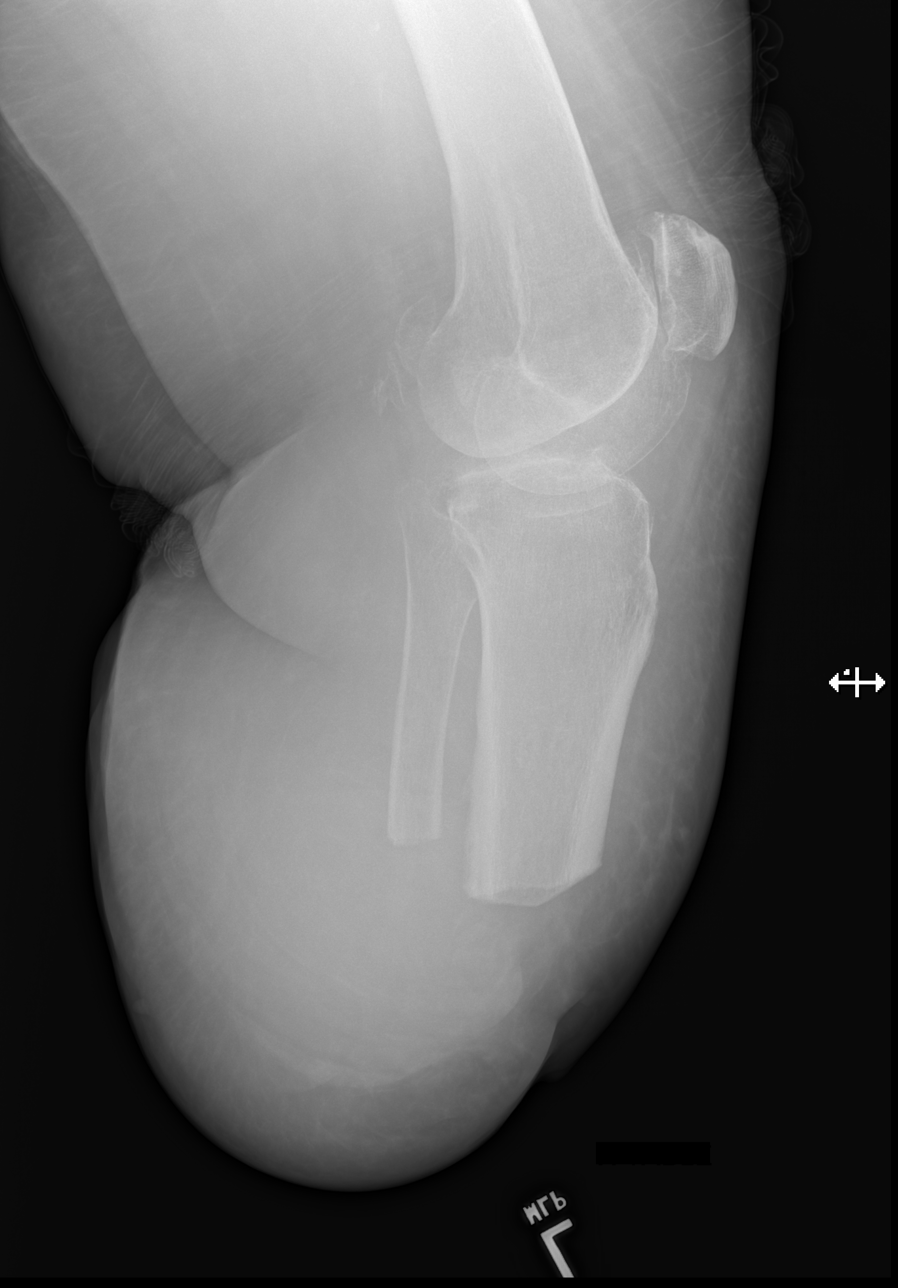

[4 of 4 positions shown; findings below may reference images not displayed]

FINDINGS: Uncomplicated amputation margins of the proximal diaphyses of the
tibia and fibula. No unexpected air or gas in the soft tissues of
the stump. No plain radiographic evidence of osteomyelitis. Chronic
degenerative changes affect the knee joint.
IMPRESSION: No unexpected finding in this patient status post below the knee
amputation.

## 2018-09-14 ENCOUNTER — Ambulatory Visit (INDEPENDENT_AMBULATORY_CARE_PROVIDER_SITE_OTHER): Payer: Medicare Other | Admitting: Family

## 2018-09-14 ENCOUNTER — Encounter: Payer: Self-pay | Admitting: Family

## 2018-09-14 DIAGNOSIS — J329 Chronic sinusitis, unspecified: Secondary | ICD-10-CM

## 2018-09-14 DIAGNOSIS — J301 Allergic rhinitis due to pollen: Secondary | ICD-10-CM | POA: Diagnosis not present

## 2018-09-14 MED ORDER — CETIRIZINE HCL 10 MG PO TABS: 10.0000 mg | ORAL_TABLET | Freq: Every day | ORAL | 11 refills | Status: DC

## 2018-09-14 MED ORDER — AMOXICILLIN-POT CLAVULANATE 875-125 MG PO TABS: 1.0000 | ORAL_TABLET | Freq: Two times a day (BID) | ORAL | 0 refills | Status: DC

## 2018-09-14 NOTE — Progress Notes (Signed)
   Virtual Visit via telephone Note  I connected with Julia West on 09/14/18 at 1:06 pm by telephone and verified that I am speaking with the correct person using two identifiers. Julia West is currently located at home and no one is currently with her during visit. The provider, Evelina Dun, FNP is located in their office at time of visit.  I discussed the limitations, risks, security and privacy concerns of performing an evaluation and management service by telephone and the availability of in person appointments. I also discussed with the patient that there may be a patient responsible charge related to this service. The patient expressed understanding and agreed to proceed.   History and Present Illness:  Sinusitis This is a new problem. The current episode started more than 1 month ago. The problem has been waxing and waning since onset. There has been no fever. Her pain is at a severity of 1/10. She is experiencing no pain. Associated symptoms include congestion, coughing, ear pain, headaches, sinus pressure and sneezing. Pertinent negatives include no shortness of breath or sore throat. Past treatments include oral decongestants. The treatment provided mild relief.      Review of Systems  HENT: Positive for congestion, ear pain, sinus pressure and sneezing. Negative for sore throat.   Respiratory: Positive for cough. Negative for shortness of breath.   Neurological: Positive for headaches.  All other systems reviewed and are negative.    Observations/Objective: No SOB or distress noted  Assessment and Plan: Julia West comes in today with chief complaint of No chief complaint on file.   Diagnosis and orders addressed:  1. Allergic rhinitis due to pollen, unspecified seasonality - amoxicillin-clavulanate (AUGMENTIN) 875-125 MG tablet; Take 1 tablet by mouth 2 (two) times daily.  Dispense: 14 tablet; Refill: 0  2. Sinusitis, unspecified chronicity,  unspecified location - Take meds as prescribed - Use a cool mist humidifier  -Use saline nose sprays frequently -Force fluids -For any cough or congestion  Use plain Mucinex- regular strength or max strength is fine -For fever or aces or pains- take tylenol or ibuprofen. -Throat lozenges if help RTO if symptoms worsen or do not improve  - amoxicillin-clavulanate (AUGMENTIN) 875-125 MG tablet; Take 1 tablet by mouth 2 (two) times daily.  Dispense: 14 tablet; Refill: 0 - cetirizine (ZYRTEC) 10 MG tablet; Take 1 tablet (10 mg total) by mouth daily.  Dispense: 30 tablet; Refill: 11      I discussed the assessment and treatment plan with the patient. The patient was provided an opportunity to ask questions and all were answered. The patient agreed with the plan and demonstrated an understanding of the instructions.   The patient was advised to call back or seek an in-person evaluation if the symptoms worsen or if the condition fails to improve as anticipated.  The above assessment and management plan was discussed with the patient. The patient verbalized understanding of and has agreed to the management plan. Patient is aware to call the clinic if symptoms persist or worsen. Patient is aware when to return to the clinic for a follow-up visit. Patient educated on when it is appropriate to go to the emergency department.   Time call ended:  1:19 pm  I provided 13 minutes of non-face-to-face time during this encounter.    Evelina Dun, FNP

## 2018-10-10 ENCOUNTER — Ambulatory Visit: Payer: Medicare Other | Admitting: Family Medicine

## 2018-10-18 ENCOUNTER — Telehealth: Payer: Self-pay | Admitting: Family Medicine

## 2018-10-18 ENCOUNTER — Encounter: Payer: Self-pay | Admitting: Family Medicine

## 2018-10-18 ENCOUNTER — Ambulatory Visit: Payer: Medicare Other | Admitting: Family Medicine

## 2018-10-18 MED ORDER — VALSARTAN-HYDROCHLOROTHIAZIDE 160-12.5 MG PO TABS: 1.0000 | ORAL_TABLET | Freq: Every day | ORAL | 0 refills | Status: DC

## 2018-10-18 NOTE — Telephone Encounter (Signed)
Pt will call back to reschedule appt, husband has doctor's appt and will need to see when that recheck will need to be

## 2018-10-18 NOTE — Telephone Encounter (Signed)
What is the name of the medication? valsartan-hydrochlorothiazide (DIOVAN-HCT) 160-12.5 MG tablet    Have you contacted your pharmacy to request a refill? yes  Which pharmacy would you like this sent to? [facility]   Patient notified that their request is being sent to the clinical staff for review and that they should receive a call once it is complete. If they do not receive a call within 24 hours they can check with their pharmacy or our office.

## 2018-10-19 NOTE — Telephone Encounter (Signed)
PLease arrange televisit for pt. Thanks, WS

## 2018-11-03 ENCOUNTER — Encounter: Payer: Self-pay | Admitting: Orthopedic Surgery

## 2018-11-10 ENCOUNTER — Other Ambulatory Visit: Payer: Self-pay | Admitting: Family Medicine

## 2018-11-10 DIAGNOSIS — E785 Hyperlipidemia, unspecified: Secondary | ICD-10-CM

## 2018-11-10 NOTE — Telephone Encounter (Signed)
Stacks pt, last seen 03/2018 and no future appt

## 2018-11-15 ENCOUNTER — Encounter: Payer: Self-pay | Admitting: Orthopedic Surgery

## 2018-11-20 ENCOUNTER — Other Ambulatory Visit: Payer: Self-pay | Admitting: Family Medicine

## 2018-12-06 ENCOUNTER — Other Ambulatory Visit: Payer: Self-pay | Admitting: Family Medicine

## 2018-12-07 NOTE — Telephone Encounter (Signed)
Attempted to contact patient - NA °

## 2018-12-15 ENCOUNTER — Other Ambulatory Visit: Payer: Self-pay

## 2018-12-18 ENCOUNTER — Encounter: Payer: Self-pay | Admitting: Family Medicine

## 2018-12-18 ENCOUNTER — Other Ambulatory Visit: Payer: Self-pay

## 2018-12-18 ENCOUNTER — Ambulatory Visit (INDEPENDENT_AMBULATORY_CARE_PROVIDER_SITE_OTHER): Payer: Medicare Other | Admitting: Family Medicine

## 2018-12-18 VITALS — BP 127/63 | HR 54 | Temp 97.3°F | Resp 18 | Ht 66.0 in | Wt 281.0 lb

## 2018-12-18 DIAGNOSIS — I2510 Atherosclerotic heart disease of native coronary artery without angina pectoris: Secondary | ICD-10-CM

## 2018-12-18 DIAGNOSIS — I10 Essential (primary) hypertension: Secondary | ICD-10-CM | POA: Diagnosis not present

## 2018-12-18 DIAGNOSIS — Z23 Encounter for immunization: Secondary | ICD-10-CM | POA: Diagnosis not present

## 2018-12-18 DIAGNOSIS — E785 Hyperlipidemia, unspecified: Secondary | ICD-10-CM | POA: Diagnosis not present

## 2018-12-18 DIAGNOSIS — Z1159 Encounter for screening for other viral diseases: Secondary | ICD-10-CM | POA: Diagnosis not present

## 2018-12-18 NOTE — Progress Notes (Addendum)
Subjective:  Patient ID: Julia West, female    DOB: April 10, 1955  Age: 63 y.o. MRN: [id number]  CC: Medical Management of Chronic Issues   HPI Julia West presents for  follow-up of hypertension. Patient has no history of headache chest pain or shortness of breath or recent cough. Patient also denies symptoms of TIA such as focal numbness or weakness. Patient denies side effects from medication. States taking it regularly.  Pt. States she is not diabetic. Cites multiple nml A1c readings. 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 She is doing well ambulating with her left leg prosthesis.    in for follow-up of elevated cholesterol. Doing well without complaints on current medication. Denies side effects including myalgia and arthralgia and nausea. Currently no chest pain, shortness of breath or other cardiovascular related symptoms noted.    History Julia West has a past medical history of Acute myocardial infarction, Anxiety, Cancer ([place]), Complication of anesthesia, Degenerative joint disease, Dehiscence of amputation stump ([place]), Dyspnea, HTN (hypertension), Hyperlipidemia LDL goal <70, Internal and external hemorrhoids without complication, Lymphedema, Lymphedema of arm, Morbid obesity ([place]), and Surgical wound, non healing (ABDOMINAL ).   She has a past surgical history that includes Cesarean section; Cholecystectomy; Coronary artery bypass graft (2003); Incisional hernia repair (02/07/2012); Bowel resection (02/07/2012); Application if wound vac; Incision and drainage of wound (04/17/2012); Lesion removal (04/17/2012); Incision and drainage of wound (N/A, 04/24/2012); Incision and drainage of wound (N/A, 05/01/2012); Incision and drainage of wound (N/A, 05/08/2012); Cardiovascular stress test (01-17-2012  [facility]); Incision and drainage of wound (N/A, 05/15/2012); Incision and drainage of wound (N/A, 05/22/2012); Incision and  drainage of wound (N/A, 06/28/2012); polypectomy (N/A, 05/14/2014); Hysteroscopy w/D&C (N/A, 05/14/2014); ORIF toe fracture (Left, 02/09/2017); Amputation (Left, 05/06/2017); I&D extremity (Left, 06/21/2017); and Stump revision (Left, 08/31/2017).   Her family history includes Birth defects in her son; Diabetes in her brother; Heart attack in her mother; Hypertension in her sister.She reports that she quit smoking about 7 years ago. Her smoking use included cigarettes. She has a 25.00 pack-year smoking history. She has never used smokeless tobacco. She reports that she does not drink alcohol or use drugs.  Current Outpatient Medications on File Prior to Visit  Medication Sig Dispense Refill  . aspirin 81 MG tablet Take 81 mg by mouth daily.     . cetirizine (ZYRTEC) 10 MG tablet Take 1 tablet (10 mg total) by mouth daily. 30 tablet 11  . fenofibrate 160 MG tablet TAKE 1 TABLET BY MOUTH  DAILY FOR CHOLESTEROL AND  TRIGLYCERIDES 90 tablet 0  . Multiple Vitamins-Minerals (MULTIVITAMIN ADULT) TABS Take 1 tablet by mouth daily.    . sertraline (ZOLOFT) 50 MG tablet TAKE 1 TABLET BY MOUTH  DAILY 90 tablet 0  . traZODone (DESYREL) 150 MG tablet TAKE 1 OR 2 TABLETS BY  MOUTH AT BEDTIME AS NEEDED  FOR SLEEP 180 tablet 1  . valsartan-hydrochlorothiazide (DIOVAN-HCT) 160-12.5 MG tablet Take 1 tablet by mouth daily. 90 tablet 0  . nitroGLYCERIN (NITROSTAT) 0.4 MG SL tablet Place 1 tablet (0.4 mg total) under the tongue every 5 (five) minutes x 3 doses as needed for chest pain. If pain persist after call 911 25 tablet 6   No current facility-administered medications on file prior to visit.     ROS Review of Systems  Constitutional: Negative.   HENT: Negative for congestion.   Eyes: Negative for visual disturbance.  Respiratory: Negative  for shortness of breath.   Cardiovascular: Negative for chest pain.  Gastrointestinal: Negative for abdominal pain, constipation, diarrhea, nausea and vomiting.  Genitourinary:  Negative for difficulty urinating.  Musculoskeletal: Positive for arthralgias and myalgias.  Neurological: Negative for headaches.  Psychiatric/Behavioral: Negative for sleep disturbance.    Objective:  BP 127/63   Pulse (!) 54   Temp (!) 97.3 F (36.3 C) (Temporal)   Resp 18   Ht '5\' 6"'$  (1.676 m)   Wt 281 lb (127.5 kg)   SpO2 95%   BMI 45.35 kg/m   BP Readings from Last 3 Encounters:  12/18/18 127/63  04/11/18 114/66  11/23/17 136/66    Wt Readings from Last 3 Encounters:  12/18/18 281 lb (127.5 kg)  04/11/18 270 lb 4 oz (122.6 kg)  02/13/18 259 lb (117.5 kg)     Physical Exam Constitutional:      General: She is not in acute distress.    Appearance: She is well-developed.  HENT:     Head: Normocephalic and atraumatic.     Right Ear: External ear normal.     Left Ear: External ear normal.     Nose: Nose normal.  Eyes:     Conjunctiva/sclera: Conjunctivae normal.     Pupils: Pupils are equal, round, and reactive to light.  Neck:     Musculoskeletal: Normal range of motion and neck supple.     Thyroid: No thyromegaly.  Cardiovascular:     Rate and Rhythm: Normal rate and regular rhythm.     Heart sounds: Normal heart sounds. No murmur.  Pulmonary:     Effort: Pulmonary effort is normal. No respiratory distress.     Breath sounds: Normal breath sounds. No wheezing or rales.  Abdominal:     General: Bowel sounds are normal. There is no distension.     Palpations: Abdomen is soft.     Tenderness: There is no abdominal tenderness.  Musculoskeletal:     Comments: L BKA noted   Lymphadenopathy:     Cervical: No cervical adenopathy.  Skin:    General: Skin is warm and dry.  Neurological:     Mental Status: She is alert and oriented to person, place, and time.     Deep Tendon Reflexes: Reflexes are normal and symmetric.  Psychiatric:        Behavior: Behavior normal.        Thought Content: Thought content normal.        Judgment: Judgment normal.        Assessment & Plan:   Julia West was seen today for medical management of chronic issues.  Diagnoses and all orders for this visit:  Encounter for hepatitis C screening test for low risk patient -     Hepatitis C antibody  Dyslipidemia -     CBC with Differential/Platelet -     CMP14+EGFR -     Lipid panel  Essential hypertension -     CBC with Differential/Platelet -     CMP14+EGFR -     Lipid panel  OBESITY, MORBID -     CBC with Differential/Platelet -     CMP14+EGFR -     Lipid panel  Coronary artery disease involving native coronary artery of native heart without angina pectoris -     CBC with Differential/Platelet -     CMP14+EGFR -     Lipid panel  Need for immunization against influenza -     Flu Vaccine QUAD High Dose(Fluad)   Allergies  as of 12/18/2018      Reactions   Ace Inhibitors Cough      Medication List       Accurate as of December 18, 2018  9:34 PM. If you have any questions, ask your nurse or doctor.        STOP taking these medications   amoxicillin-clavulanate 875-125 MG tablet Commonly known as: AUGMENTIN Stopped by: [name], MD     TAKE these medications   aspirin 81 MG tablet Take 81 mg by mouth daily.   cetirizine 10 MG tablet Commonly known as: ZYRTEC Take 1 tablet (10 mg total) by mouth daily.   fenofibrate 160 MG tablet TAKE 1 TABLET BY MOUTH  DAILY FOR CHOLESTEROL AND  TRIGLYCERIDES   Multivitamin Adult Tabs Take 1 tablet by mouth daily.   nitroGLYCERIN 0.4 MG SL tablet Commonly known as: NITROSTAT Place 1 tablet (0.4 mg total) under the tongue every 5 (five) minutes x 3 doses as needed for chest pain. If pain persist after call 911   sertraline 50 MG tablet Commonly known as: ZOLOFT TAKE 1 TABLET BY MOUTH  DAILY   traZODone 150 MG tablet Commonly known as: DESYREL TAKE 1 OR 2 TABLETS BY  MOUTH AT BEDTIME AS NEEDED  FOR SLEEP   valsartan-hydrochlorothiazide 160-12.5 MG tablet Commonly known as: DIOVAN-HCT  Take 1 tablet by mouth daily.       No orders of the defined types were placed in this encounter.     Follow-up: Return in about 6 months (around 06/18/2019).  [name], M.D.

## 2018-12-19 LAB — CMP14+EGFR
ALT: 12 IU/L (ref 0–32)
AST: 13 IU/L (ref 0–40)
Albumin/Globulin Ratio: 1.4 (ref 1.2–2.2)
Albumin: 3.9 g/dL (ref 3.8–4.8)
Alkaline Phosphatase: 51 IU/L (ref 39–117)
BUN/Creatinine Ratio: 20 (ref 12–28)
BUN: 19 mg/dL (ref 8–27)
Bilirubin Total: 0.2 mg/dL (ref 0.0–1.2)
CO2: 24 mmol/L (ref 20–29)
Calcium: 9 mg/dL (ref 8.7–10.3)
Chloride: 104 mmol/L (ref 96–106)
Creatinine, Ser: 0.93 mg/dL (ref 0.57–1.00)
GFR calc Af Amer: 75 mL/min/{1.73_m2} (ref >59)
GFR calc non Af Amer: 65 mL/min/{1.73_m2} (ref >59)
Globulin, Total: 2.8 g/dL (ref 1.5–4.5)
Glucose: 80 mg/dL (ref 65–99)
Potassium: 4.3 mmol/L (ref 3.5–5.2)
Sodium: 141 mmol/L (ref 134–144)
Total Protein: 6.7 g/dL (ref 6.0–8.5)

## 2018-12-19 LAB — CBC WITH DIFFERENTIAL/PLATELET
Basophils Absolute: 0.1 10*3/uL (ref 0.0–0.2)
Basos: 1 %
EOS (ABSOLUTE): 0.3 10*3/uL (ref 0.0–0.4)
Eos: 3 %
Hematocrit: 41.1 % (ref 34.0–46.6)
Hemoglobin: 13.1 g/dL (ref 11.1–15.9)
Immature Grans (Abs): 0 10*3/uL (ref 0.0–0.1)
Immature Granulocytes: 0 %
Lymphocytes Absolute: 4 10*3/uL — ABNORMAL HIGH (ref 0.7–3.1)
Lymphs: 37 %
MCH: 28.1 pg (ref 26.6–33.0)
MCHC: 31.9 g/dL (ref 31.5–35.7)
MCV: 88 fL (ref 79–97)
Monocytes Absolute: 0.9 10*3/uL (ref 0.1–0.9)
Monocytes: 8 %
Neutrophils Absolute: 5.5 10*3/uL (ref 1.4–7.0)
Neutrophils: 51 %
Platelets: 281 10*3/uL (ref 150–450)
RBC: 4.66 x10E6/uL (ref 3.77–5.28)
RDW: 14.2 % (ref 11.7–15.4)
WBC: 10.8 10*3/uL (ref 3.4–10.8)

## 2018-12-19 LAB — LIPID PANEL
Chol/HDL Ratio: 3.4 ratio (ref 0.0–4.4)
Cholesterol, Total: 130 mg/dL (ref 100–199)
HDL: 38 mg/dL — ABNORMAL LOW (ref >39)
LDL Chol Calc (NIH): 70 mg/dL (ref 0–99)
Triglycerides: 125 mg/dL (ref 0–149)
VLDL Cholesterol Cal: 22 mg/dL (ref 5–40)

## 2018-12-19 LAB — HEPATITIS C ANTIBODY: Hep C Virus Ab: <0.1 s/co ratio (ref 0.0–0.9)

## 2018-12-19 NOTE — Progress Notes (Deleted)
Hello Deshawna,  Your lab result is normal and/or stable.Some minor variations that are not significant are commonly marked abnormal, but do not represent any medical problem for you.  Best regards, Susano Cleckler, M.D.

## 2018-12-27 ENCOUNTER — Other Ambulatory Visit: Payer: Self-pay | Admitting: Family Medicine

## 2018-12-27 DIAGNOSIS — E785 Hyperlipidemia, unspecified: Secondary | ICD-10-CM

## 2019-01-05 ENCOUNTER — Encounter: Payer: Self-pay | Admitting: Family Medicine

## 2019-01-14 ENCOUNTER — Other Ambulatory Visit: Payer: Self-pay | Admitting: Family Medicine

## 2019-01-16 NOTE — Progress Notes (Signed)
Cardiology Office Note   Date:  01/17/2019   ID:  Julia West, DOB 24-Mar-1955, MRN [id number]  PCP:  [name], MD  Cardiologist:   No primary care provider on file.  Chief Complaint  Patient presents with  . Coronary Artery Disease       History of Present Illness: Julia West is a 63 y.o. female who presented for evaluation of CAD.  Since I last saw her she started walking with a prosthesis and crutches.  She is gained some of the weight back and she is disappointed.  However, she feels well. The patient denies any new symptoms such as chest discomfort, neck or arm discomfort. There has been no new shortness of breath, PND or orthopnea. There have been no reported palpitations, presyncope or syncope.    Past Medical History:  Diagnosis Date  . Acute myocardial infarction    with a ruptured plaque in the circumflex in 2003  . Anxiety   . Cancer ([place])    basal cell face  . Complication of anesthesia    states low O2 sats post-op 11/13, always slow to awaken  . Degenerative joint disease   . 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 Dyspnea    with activity- car to house - steps  . HTN (hypertension)   . Hyperlipidemia LDL goal <70   . Internal and external hemorrhoids without complication   . Lymphedema    right side of body  . Lymphedema of arm    right  . Morbid obesity ([place])   . Surgical wound, non healing ABDOMINAL    has wound vac @ 125 mm Hg    Past Surgical History:  Procedure Laterality Date  . AMPUTATION Left 05/06/2017   Procedure: BELOW KNEE AMPUTATION;  Surgeon: Newt Minion, MD;  Location: [place];  Service: Orthopedics;  Laterality: Left;  . APPLICATION OF WOUND VAC    . BOWEL RESECTION  02/07/2012   Procedure: SMALL BOWEL RESECTION;  Surgeon: Gayland Curry, MD,FACS;  Location: [place];  Service: General;;  . CARDIOVASCULAR STRESS TEST  01-17-2012  DR Maripat Borba   LOW RISK NUCLEAR TEST  . CESAREAN SECTION     x 4 in  remote past  . CHOLECYSTECTOMY    . CORONARY ARTERY BYPASS GRAFT  2003   by Dr. Servando Snare. LIMA to the LAD, free RIMA to the circumflex. Stress perfusion study December 2009 with no high-risk areas of ischemia. She has a well-preserved ejection fraction  . HYSTEROSCOPY W/D&C N/A 05/14/2014   Procedure: DILATATION AND CURETTAGE (no specimen); HYSTEROSCOPY;  Surgeon: Jonnie Kind, MD;  Location: [facility];  Service: Gynecology;  Laterality: N/A;  . I&D EXTREMITY Left 06/21/2017   Procedure: IRRIGATION AND DEBRIDEMENT OF LEFT LEG AMPUTATION SITE;  Surgeon: Wallace Going, DO;  Location: [facility];  Service: Plastics;  Laterality: Left;  . INCISION AND DRAINAGE OF WOUND  04/17/2012   Procedure: IRRIGATION AND DEBRIDEMENT WOUND;  Surgeon: Theodoro Kos, DO;  Location: [place];  Service: Plastics;  Laterality: N/A;  OF ABDOMINAL WOUND, SURGICAL PREP AND PLACEMENT OF VAC, REMOVAL FOREHEAD SKIN LESION  . INCISION AND DRAINAGE OF WOUND N/A 04/24/2012   Procedure: IRRIGATION AND DEBRIDEMENT WOUND;  Surgeon: Theodoro Kos, DO;  Location: [place];  Service: Plastics;  Laterality: N/A;  I & D ABDOMINAL WOUND WITH VAC AND ACELL  . INCISION AND DRAINAGE OF WOUND N/A 05/01/2012  Procedure: IRRIGATION AND DEBRIDEMENT WOUND;  Surgeon: Theodoro Kos, DO;  Location: [place];  Service: Plastics;  Laterality: N/A;  WITH SURGICAL PREP AND PLACEMENT OF VAC  . INCISION AND DRAINAGE OF WOUND N/A 05/08/2012   Procedure: IRRIGATION AND DEBRIDEMENT OF ABD WOUND SURGICAL PREP AND PLACEMENT OF VAC ;  Surgeon: Theodoro Kos, DO;  Location: [place];  Service: Plastics;  Laterality: N/A;  IRRIGATION AND DEBRIDEMENT OF ABD WOUND SURGICAL PREP AND PLACEMENT OF VAC   . INCISION AND DRAINAGE OF WOUND N/A 05/15/2012   Procedure: IRRIGATION AND DEBRIDEMENT OF ABDOMINAL WOUND WITH POSSIBLE SURGICAL PREP AND PLACEMENT OF VAC;  Surgeon: Theodoro Kos, DO;  Location: [place];  Service: Plastics;  Laterality: N/A;  . INCISION AND DRAINAGE OF WOUND N/A 05/22/2012   Procedure: IRRIGATION AND DEBRIDEMENT OF ABODOMINAL WOUND WITH  SURGICAL PREP AND VAC PLACEMENT;  Surgeon: Theodoro Kos, DO;  Location: [place];  Service: Plastics;  Laterality: N/A;  . INCISION AND DRAINAGE OF WOUND N/A 06/28/2012   Procedure: IRRIGATION AND DEBRIDEMENT OF ABDOMINAL ULCER SURGICAL PREP AND PLACEMENT OF ACELL AND VAC;  Surgeon: Theodoro Kos, DO;  Location: [place];  Service: Plastics;  Laterality: N/A;  . INCISIONAL HERNIA REPAIR  02/07/2012   Procedure: HERNIA REPAIR INCISIONAL;  Surgeon: Gayland Curry, MD,FACS;  Location: [place];  Service: General;;  Open, Primary repair, strangulated Incisional hernia.  Marland Kitchen LESION REMOVAL  04/17/2012   Procedure: LESION REMOVAL;  Surgeon: Theodoro Kos, DO;  Location: [place];  Service: Plastics;  Laterality: N/A;   CENTER OF FOREHEAD, REMOVAL FORHEAD SKIN LESION  . ORIF TOE FRACTURE Left 02/09/2017   Procedure: EXCISION TALAR HEAD, INTERNAL FIXATION MEDIAL COLUMN LEFT FOOT;  Surgeon: Newt Minion, MD;  Location: [facility];  Service: Orthopedics;  Laterality: Left;  . POLYPECTOMY N/A 05/14/2014   Procedure: ENDOMETRIAL POLYPECTOMY;  Surgeon: Jonnie Kind, MD;  Location: [facility];  Service: Gynecology;  Laterality: N/A;  . STUMP REVISION Left 08/31/2017   Procedure: REVISION LEFT BELOW KNEE AMPUTATION;  Surgeon: Newt Minion, MD;  Location: [place];  Service: Orthopedics;  Laterality: Left;     Current Outpatient Medications  Medication Sig Dispense Refill  . aspirin 81 MG tablet Take 81 mg by mouth daily.     . cetirizine (ZYRTEC) 10 MG tablet Take 1 tablet (10 mg total) by mouth daily. 30 tablet 11  . fenofibrate 160 MG tablet TAKE 1 TABLET BY MOUTH  DAILY FOR CHOLESTEROL AND  TRIGLYCERIDES 90 tablet 3  . Multiple Vitamins-Minerals (MULTIVITAMIN ADULT) TABS Take 1 tablet by mouth  daily.    . nitroGLYCERIN (NITROSTAT) 0.4 MG SL tablet Place 1 tablet (0.4 mg total) under the tongue every 5 (five) minutes x 3 doses as needed for chest pain. If pain persist after call 911 25 tablet 6  . sertraline (ZOLOFT) 50 MG tablet TAKE 1 TABLET BY MOUTH  DAILY 90 tablet 0  . traZODone (DESYREL) 150 MG tablet TAKE 1 OR 2 TABLETS BY  MOUTH AT BEDTIME AS NEEDED  FOR SLEEP 180 tablet 3  . valsartan-hydrochlorothiazide (DIOVAN-HCT) 160-12.5 MG tablet Take 1 tablet by mouth daily. 90 tablet 0   No current facility-administered medications for this visit.     Allergies:   Ace inhibitors    ROS:  Please see the history of present illness.   Otherwise, review of systems are positive for none.   All other systems  are reviewed and negative.    PHYSICAL EXAM: VS:  BP 132/70   Pulse (!) 55   Ht 5\' 7"  (1.702 m)   Wt 280 lb (127 kg)   BMI 43.85 kg/m  , BMI Body mass index is 43.85 kg/m. NECK:  No jugular venous distention at 90 degrees, waveform within normal limits, carotid upstroke brisk and symmetric, no bruits, no thyromegaly LUNGS:  Clear to auscultation bilaterally CHEST:  Unremarkable HEART:  S1 and S2 within normal limits, no S3, no S4, no clicks, no rubs, no murmurs ABD:  Positive bowel sounds normal in frequency in pitch, no bruits, no rebound, no guarding, unable to assess midline mass or bruit with the patient seated. EXT:  2 plus pulses throughout, no edema, no cyanosis no clubbing status post left lower extremity amputation, right leg lymphedema   EKG:  EKG is  ordered today. Sinus rhythm, rate 55, axis within normal limits, intervals within normal limits, poor anterior R wave progression, no acute ST-T wave changes.  Recent Labs: 12/18/2018: ALT 12; BUN 19; Creatinine, Ser 0.93; Hemoglobin 13.1; Platelets 281; Potassium 4.3; Sodium 141    Lipid Panel    Component Value Date/Time   CHOL 130 12/18/2018 1627   TRIG 125 12/18/2018 1627   TRIG 247 (H) 09/12/2013 1137    HDL 38 (L) 12/18/2018 1627   HDL 35 (L) 09/12/2013 1137   CHOLHDL 3.4 12/18/2018 1627   LDLCALC 70 12/18/2018 1627   LDLCALC 115 (H) 09/12/2013 1137      Wt Readings from Last 3 Encounters:  01/17/19 280 lb (127 kg)  12/18/18 281 lb (127.5 kg)  04/11/18 270 lb 4 oz (122.6 kg)      Other studies Reviewed: Additional studies/ records that were reviewed today include: Labs Review of the above records demonstrates: See below   ASSESSMENT AND PLAN:  CAD:  The patient has no new sypmtoms.  No further cardiovascular testing is indicated.  We will continue with aggressive risk reduction and meds as listed.  SINUS BRADYCARDIA: She tolerates this.  No change in therapy.  We are avoiding AV nodal blocking agents.  HTN:  The blood pressure is at target.  No change in therapy.   DYSLIPIDEMIA:  Lipids are excellent with an LDL of 69 and HDL of 38.  No change in therapy.   Current medicines are reviewed at length with the patient today.  The patient does not have concerns regarding medicines.  The following changes have been made:  None  Labs/ tests ordered today include: None  Orders Placed This Encounter  Procedures  . EKG 12-Lead     Disposition:   FU with me in 1 year   Signed, [name], MD  01/17/2019 4:39 PM    [place]

## 2019-01-17 ENCOUNTER — Encounter: Payer: Self-pay | Admitting: Cardiology

## 2019-01-17 ENCOUNTER — Telehealth: Payer: Self-pay | Admitting: Family Medicine

## 2019-01-17 ENCOUNTER — Other Ambulatory Visit: Payer: Self-pay

## 2019-01-17 ENCOUNTER — Ambulatory Visit (INDEPENDENT_AMBULATORY_CARE_PROVIDER_SITE_OTHER): Payer: Medicare Other | Admitting: Cardiology

## 2019-01-17 VITALS — BP 132/70 | HR 55 | Ht 67.0 in | Wt 280.0 lb

## 2019-01-17 DIAGNOSIS — I2510 Atherosclerotic heart disease of native coronary artery without angina pectoris: Secondary | ICD-10-CM | POA: Diagnosis not present

## 2019-01-17 DIAGNOSIS — R001 Bradycardia, unspecified: Secondary | ICD-10-CM

## 2019-01-17 DIAGNOSIS — I10 Essential (primary) hypertension: Secondary | ICD-10-CM | POA: Diagnosis not present

## 2019-01-17 DIAGNOSIS — E785 Hyperlipidemia, unspecified: Secondary | ICD-10-CM | POA: Diagnosis not present

## 2019-01-17 NOTE — Telephone Encounter (Signed)
OTC mucinex D - ask pharmacist for it

## 2019-01-17 NOTE — Telephone Encounter (Signed)
Please advise 

## 2019-01-17 NOTE — Telephone Encounter (Signed)
Patient notified and verbalized understanding. 

## 2019-01-17 NOTE — Patient Instructions (Signed)
Medication Instructions:  The current medical regimen is effective;  continue present plan and medications.  *If you need a refill on your cardiac medications before your next appointment, please call your pharmacy*  Your next appointment:   Follow up in 1 year with Dr. Percival Spanish.  You will receive a letter in the mail 2 months before you are due.  Please call us when you receive this letter to schedule your follow up appointment.  Thank you for choosing [facility]!!

## 2019-02-11 ENCOUNTER — Other Ambulatory Visit: Payer: Self-pay | Admitting: Family Medicine

## 2019-03-18 ENCOUNTER — Other Ambulatory Visit: Payer: Self-pay | Admitting: Family Medicine

## 2019-03-19 ENCOUNTER — Other Ambulatory Visit: Payer: Self-pay | Admitting: Family Medicine

## 2019-03-20 DIAGNOSIS — Z89512 Acquired absence of left leg below knee: Secondary | ICD-10-CM | POA: Diagnosis not present

## 2019-05-18 ENCOUNTER — Telehealth: Payer: Self-pay | Admitting: Family Medicine

## 2019-05-18 NOTE — Chronic Care Management (AMB) (Signed)
  Chronic Care Management   Note  05/18/2019 Name: Julia West MRN: [id number] DOB: 07/27/1955  Julia West is a 64 y.o. year old female who is a primary care patient of Stacks, Cletus Gash, MD. I reached out to Julia West by phone today in response to a referral sent by Julia West's health plan.     Julia West was given information about Chronic Care Management services today including:  1. CCM service includes personalized support from designated clinical staff supervised by her physician, including individualized plan of care and coordination with other care providers 2. 24/7 contact phone numbers for assistance for urgent and routine care needs. 3. Service will only be billed when office clinical staff spend 20 minutes or more in a month to coordinate care. 4. Only one practitioner may furnish and bill the service in a calendar month. 5. The patient may stop CCM services at any time (effective at the end of the month) by phone call to the office staff. 6. The patient will be responsible for cost sharing (co-pay) of up to 20% of the service fee (after annual deductible is met).  Patient agreed to services and verbal consent obtained.   Follow up plan: Telephone appointment with care management team member scheduled for: 07/17/2019  Noreene Larsson, [place], [place], [place] [address] Direct Dial: [phone] [email or web address] Website: [email or web address]

## 2019-06-07 ENCOUNTER — Other Ambulatory Visit: Payer: Self-pay | Admitting: Family Medicine

## 2019-06-08 ENCOUNTER — Telehealth: Payer: Self-pay | Admitting: *Deleted

## 2019-06-08 MED ORDER — SERTRALINE HCL 50 MG PO TABS: 50.0000 mg | ORAL_TABLET | Freq: Every day | ORAL | 0 refills | Status: DC

## 2019-06-08 NOTE — Telephone Encounter (Signed)
Stacks. NTBS mail order not sent Dx not on last 2 visit

## 2019-06-08 NOTE — Telephone Encounter (Signed)
Fill sent to pharmacy appt made for 07/12/19

## 2019-06-08 NOTE — Telephone Encounter (Signed)
Appt made for 07/12/19

## 2019-07-12 ENCOUNTER — Ambulatory Visit: Payer: Medicare Other | Admitting: Family Medicine

## 2019-07-13 ENCOUNTER — Telehealth: Payer: Self-pay | Admitting: *Deleted

## 2019-07-13 NOTE — Telephone Encounter (Signed)
Have her get flonase OTC 2 sprays each nosrill daily

## 2019-07-13 NOTE — Telephone Encounter (Signed)
Patient aware.

## 2019-07-13 NOTE — Telephone Encounter (Cosign Needed)
07/13/2019  I spoke with patient today about her sinus symptoms while calling to talk with her about her husband. She thinks she has a sinus infection. Complains of nasal congestion and facial/pain pressure. This is typical for her this time of year. She takes Zyrtec daily.    Please call with recommendations or to schedule a visit.   Chong Sicilian, BSN, RN-BC Embedded Chronic Care Manager [facility] Direct Dial: [phone]

## 2019-07-17 ENCOUNTER — Ambulatory Visit: Payer: Medicare Other | Admitting: *Deleted

## 2019-07-17 DIAGNOSIS — I10 Essential (primary) hypertension: Secondary | ICD-10-CM

## 2019-07-17 DIAGNOSIS — E11610 Type 2 diabetes mellitus with diabetic neuropathic arthropathy: Secondary | ICD-10-CM

## 2019-07-27 ENCOUNTER — Other Ambulatory Visit: Payer: Self-pay | Admitting: Family Medicine

## 2019-08-16 ENCOUNTER — Ambulatory Visit (INDEPENDENT_AMBULATORY_CARE_PROVIDER_SITE_OTHER): Payer: Medicare Other | Admitting: Family Medicine

## 2019-08-16 ENCOUNTER — Encounter: Payer: Self-pay | Admitting: Family Medicine

## 2019-08-16 ENCOUNTER — Other Ambulatory Visit: Payer: Self-pay

## 2019-08-16 VITALS — BP 116/77 | HR 81 | Temp 97.6°F | Ht 67.0 in | Wt 287.0 lb

## 2019-08-16 DIAGNOSIS — I10 Essential (primary) hypertension: Secondary | ICD-10-CM

## 2019-08-16 DIAGNOSIS — E11610 Type 2 diabetes mellitus with diabetic neuropathic arthropathy: Secondary | ICD-10-CM | POA: Diagnosis not present

## 2019-08-16 DIAGNOSIS — E785 Hyperlipidemia, unspecified: Secondary | ICD-10-CM

## 2019-08-16 DIAGNOSIS — M25561 Pain in right knee: Secondary | ICD-10-CM

## 2019-08-16 DIAGNOSIS — Z89512 Acquired absence of left leg below knee: Secondary | ICD-10-CM

## 2019-08-16 LAB — CBC WITH DIFFERENTIAL/PLATELET
Basophils Absolute: 0.1 10*3/uL (ref 0.0–0.2)
Basos: 1 %
EOS (ABSOLUTE): 0.3 10*3/uL (ref 0.0–0.4)
Eos: 2 %
Hematocrit: 45.6 % (ref 34.0–46.6)
Hemoglobin: 15.1 g/dL (ref 11.1–15.9)
Immature Grans (Abs): 0 10*3/uL (ref 0.0–0.1)
Immature Granulocytes: 0 %
Lymphocytes Absolute: 4.6 10*3/uL — ABNORMAL HIGH (ref 0.7–3.1)
Lymphs: 41 %
MCH: 28.3 pg (ref 26.6–33.0)
MCHC: 33.1 g/dL (ref 31.5–35.7)
MCV: 85 fL (ref 79–97)
Monocytes Absolute: 1 10*3/uL — ABNORMAL HIGH (ref 0.1–0.9)
Monocytes: 9 %
Neutrophils Absolute: 5.3 10*3/uL (ref 1.4–7.0)
Neutrophils: 47 %
Platelets: 277 10*3/uL (ref 150–450)
RBC: 5.34 x10E6/uL — ABNORMAL HIGH (ref 3.77–5.28)
RDW: 14.9 % (ref 11.7–15.4)
WBC: 11.2 10*3/uL — ABNORMAL HIGH (ref 3.4–10.8)

## 2019-08-16 LAB — CMP14+EGFR
ALT: 11 IU/L (ref 0–32)
AST: 14 IU/L (ref 0–40)
Albumin/Globulin Ratio: 1.7 (ref 1.2–2.2)
Albumin: 4.3 g/dL (ref 3.8–4.8)
Alkaline Phosphatase: 44 IU/L — ABNORMAL LOW (ref 48–121)
BUN/Creatinine Ratio: 18 (ref 12–28)
BUN: 20 mg/dL (ref 8–27)
Bilirubin Total: 0.4 mg/dL (ref 0.0–1.2)
CO2: 25 mmol/L (ref 20–29)
Calcium: 9.8 mg/dL (ref 8.7–10.3)
Chloride: 101 mmol/L (ref 96–106)
Creatinine, Ser: 1.13 mg/dL — ABNORMAL HIGH (ref 0.57–1.00)
GFR calc Af Amer: 59 mL/min/{1.73_m2} — ABNORMAL LOW (ref >59)
GFR calc non Af Amer: 51 mL/min/{1.73_m2} — ABNORMAL LOW (ref >59)
Globulin, Total: 2.5 g/dL (ref 1.5–4.5)
Glucose: 85 mg/dL (ref 65–99)
Potassium: 4.3 mmol/L (ref 3.5–5.2)
Sodium: 140 mmol/L (ref 134–144)
Total Protein: 6.8 g/dL (ref 6.0–8.5)

## 2019-08-16 LAB — LIPID PANEL
Chol/HDL Ratio: 3.5 ratio (ref 0.0–4.4)
Cholesterol, Total: 133 mg/dL (ref 100–199)
HDL: 38 mg/dL — ABNORMAL LOW (ref >39)
LDL Chol Calc (NIH): 73 mg/dL (ref 0–99)
Triglycerides: 119 mg/dL (ref 0–149)
VLDL Cholesterol Cal: 22 mg/dL (ref 5–40)

## 2019-08-16 LAB — BAYER DCA HB A1C WAIVED: HB A1C (BAYER DCA - WAIVED): 5.6 % (ref <7.0)

## 2019-08-16 MED ORDER — CLOTRIMAZOLE-BETAMETHASONE 1-0.05 % EX CREA: 1.0000 "application " | TOPICAL_CREAM | Freq: Two times a day (BID) | CUTANEOUS | 1 refills | Status: DC

## 2019-08-16 NOTE — Progress Notes (Addendum)
Subjective:  Patient ID: Julia West,  female    DOB: Apr 01, 1955  Age: 64 y.o.    CC: Follow-up (6 month)   HPI Julia West presents for  follow-up of hypertension. Patient has no history of headache chest pain or shortness of breath or recent cough. Patient also denies symptoms of TIA such as numbness weakness lateralizing. Patient denies side effects from medication. States taking it regularly.  Patient also  in for follow-up of elevated cholesterol. Doing well without complaints on current medication. Denies side effects  including myalgia and arthralgia and nausea. Also in today for liver function testing. Currently no chest pain, shortness of breath or other cardiovascular related symptoms noted.  Patient lost her right lower extremity to osteomyelitis and Charcot joint last year.  She is having pain in the right knee at this time and rates it 5-6/10 requests cortisone injection.  History Julia West has a past medical history of Acute myocardial infarction, Anxiety, Cancer ([place]), Complication of anesthesia, Degenerative joint disease, Dehiscence of amputation stump ([place]), Dyspnea, HTN (hypertension), Hyperlipidemia LDL goal <70, Internal and external hemorrhoids without complication, Lymphedema, Lymphedema of arm, Morbid obesity ([place]), and Surgical wound, non healing (ABDOMINAL ).   She has a past surgical history that includes Cesarean section; Cholecystectomy; Coronary artery bypass graft (2003); Incisional hernia repair (02/07/2012); Bowel resection (02/07/2012); Application if wound vac; Incision and drainage of wound (04/17/2012); Lesion removal (04/17/2012); Incision and drainage of wound (N/A, 04/24/2012); Incision and drainage of wound (N/A, 05/01/2012); Incision and drainage of wound (N/A, 05/08/2012); Cardiovascular stress test (01-17-2012  [facility]); Incision and drainage of wound (N/A, 05/15/2012); Incision and drainage of wound (N/A, 05/22/2012); Incision and drainage of  wound (N/A, 06/28/2012); polypectomy (N/A, 05/14/2014); Hysteroscopy with D & C (N/A, 05/14/2014); ORIF toe fracture (Left, 02/09/2017); Amputation (Left, 05/06/2017); I & D extremity (Left, 06/21/2017); and Stump revision (Left, 08/31/2017).   Her family history includes Birth defects in her son; Diabetes in her brother; Heart attack in her mother; Hypertension in her sister.She reports that she quit smoking about 7 years ago. Her smoking use included cigarettes. She has a 25.00 pack-year smoking history. She has never used smokeless tobacco. She reports that she does not drink alcohol or use drugs.  Current Outpatient Medications on File Prior to Visit  Medication Sig Dispense Refill  . aspirin 81 MG tablet Take 81 mg by mouth daily.     . cetirizine (ZYRTEC) 10 MG tablet Take 1 tablet (10 mg total) by mouth daily. 30 tablet 11  . fenofibrate 160 MG tablet TAKE 1 TABLET BY MOUTH  DAILY FOR CHOLESTEROL AND  TRIGLYCERIDES 90 tablet 3  . Multiple Vitamins-Minerals (MULTIVITAMIN ADULT) TABS Take 1 tablet by mouth daily.    . sertraline (ZOLOFT) 50 MG tablet Take 1 tablet (50 mg total) by mouth daily. 90 tablet 0  . traZODone (DESYREL) 150 MG tablet TAKE 1 OR 2 TABLETS BY  MOUTH AT BEDTIME AS NEEDED  FOR SLEEP 180 tablet 3  . valsartan-hydrochlorothiazide (DIOVAN-HCT) 160-12.5 MG tablet TAKE 1 TABLET BY MOUTH  DAILY 90 tablet 0  . nitroGLYCERIN (NITROSTAT) 0.4 MG SL tablet Place 1 tablet (0.4 mg total) under the tongue every 5 (five) minutes x 3 doses as needed for chest pain. If pain persist after call 911 25 tablet 6   No current facility-administered medications on file prior to visit.    ROS Review of Systems  Constitutional: Negative.   HENT: Negative for congestion.   Eyes: Negative  for visual disturbance.  Respiratory: Negative for shortness of breath.   Cardiovascular: Negative for chest pain.  Gastrointestinal: Negative for abdominal pain, constipation, diarrhea, nausea and vomiting.   Genitourinary: Negative for difficulty urinating.  Musculoskeletal: Negative for arthralgias and myalgias.  Neurological: Negative for headaches.  Psychiatric/Behavioral: Negative for sleep disturbance.    Objective:  BP 116/77   Pulse 81   Temp 97.6 F (36.4 C) (Temporal)   Ht _0  (1.702 m)   Wt 287 lb (130.2 kg)   BMI 44.95 kg/m   BP Readings from Last 3 Encounters:  08/16/19 116/77  01/17/19 132/70  12/18/18 127/63    Wt Readings from Last 3 Encounters:  08/16/19 287 lb (130.2 kg)  01/17/19 280 lb (127 kg)  12/18/18 281 lb (127.5 kg)     Physical Exam Constitutional:      General: She is not in acute distress.    Appearance: She is well-developed.  HENT:     Head: Normocephalic and atraumatic.     Right Ear: External ear normal.     Left Ear: External ear normal.     Nose: Nose normal.  Eyes:     Conjunctiva/sclera: Conjunctivae normal.     Pupils: Pupils are equal, round, and reactive to light.  Neck:     Thyroid: No thyromegaly.  Cardiovascular:     Rate and Rhythm: Normal rate and regular rhythm.     Heart sounds: Normal heart sounds. No murmur.  Pulmonary:     Effort: Pulmonary effort is normal. No respiratory distress.     Breath sounds: Normal breath sounds. No wheezing or rales.  Abdominal:     General: Bowel sounds are normal. There is no distension.     Palpations: Abdomen is soft.     Tenderness: There is no abdominal tenderness.  Musculoskeletal:     Cervical back: Normal range of motion and neck supple.  Lymphadenopathy:     Cervical: No cervical adenopathy.  Skin:    General: Skin is warm and dry.  Neurological:     Mental Status: She is alert and oriented to person, place, and time.     Deep Tendon Reflexes: Reflexes are normal and symmetric.  Psychiatric:        Behavior: Behavior normal.        Thought Content: Thought content normal.        Judgment: Judgment normal.     Assessment & Plan:   Julia West was seen today for  follow-up.  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 To affected areas until rash clears -     clotrimazole-betamethasone (LOTRISONE) cream; Apply 1 application topically 2 (two) times daily. To affected areas until rash clears  I have discontinued Anuhea L. Gensel's clotrimazole-betamethasone. I am also having her start on clotrimazole-betamethasone. Additionally, I am having her maintain her aspirin, nitroGLYCERIN, Multivitamin Adult, cetirizine, fenofibrate, traZODone, sertraline, and valsartan-hydrochlorothiazide.  Meds ordered this encounter  Medications  . DISCONTD: clotrimazole-betamethasone (LOTRISONE) cream    Sig: Apply 1 application topically 2 (two) times daily. To affected areas until rash clears    Dispense:  45 g    Refill:  1  . clotrimazole-betamethasone (LOTRISONE) cream    Sig: Apply 1 application topically 2 (two) times daily. To affected areas until rash clears    Dispense:  45 g    Refill:  1   . After obtaining informed consent, A steroid injection was performed at the right knee. The lateral compartment was cleansed with betadine. With 1.5 inch, 23 gauge needle using 2.5 ml marcan and 9 mg of Celestone the joint space was entered and injected. This was well tolerated. Simple dressing applied.  Follow-up: Return in about 3 months  (around 11/16/2019).  [name], M.D.

## 2019-08-16 NOTE — Chronic Care Management (AMB) (Signed)
  Chronic Care Management   Initial Visit Outreach Note  07/17/2019 Name: Julia West MRN: [id number] DOB: 02-08-1956  Referred by: [name], MD Reason for referral : Chronic Care Management (initial visit)   An unsuccessful Initial Telephone Visit was attempted today. The patient was referred to the case management team for assistance with care management and care coordination.   Follow Up Plan: A HIPPA compliant phone message was left for the patient providing contact information and requesting a return call.  The care management team will reach out to the patient again over the next 45 days.   Chong Sicilian, BSN, RN-BC Embedded Chronic Care Manager [facility] [place] Management Direct Dial: [phone]

## 2019-08-17 LAB — MICROALBUMIN / CREATININE URINE RATIO
Creatinine, Urine: 95.5 mg/dL
Microalb/Creat Ratio: 13 mg/g creat (ref 0–29)
Microalbumin, Urine: 12.4 ug/mL

## 2019-08-17 NOTE — Progress Notes (Signed)
Hello [name],  Your lab result is normal and/or stable.Some minor variations that are not significant are commonly marked abnormal, but do not represent any medical problem for you.  Best regards, Chrisangel Eskenazi, M.D.

## 2019-08-19 ENCOUNTER — Encounter: Payer: Self-pay | Admitting: Family Medicine

## 2019-08-27 ENCOUNTER — Other Ambulatory Visit: Payer: Self-pay | Admitting: Family Medicine

## 2019-10-03 ENCOUNTER — Ambulatory Visit: Payer: Medicare Other | Admitting: *Deleted

## 2019-10-03 DIAGNOSIS — E11610 Type 2 diabetes mellitus with diabetic neuropathic arthropathy: Secondary | ICD-10-CM

## 2019-10-03 DIAGNOSIS — I10 Essential (primary) hypertension: Secondary | ICD-10-CM

## 2019-10-03 NOTE — Chronic Care Management (AMB) (Signed)
  Chronic Care Management   Initial Visit Note  10/03/2019 Name: Julia West MRN: [id number] DOB: 04-05-1955  Referred by: Julia Fraise, MD Reason for referral : Chronic Care Management (Initial Visit)   Julia West is a 64 y.o. year old female who is a primary care patient of Stacks, Cletus Gash, MD. The CCM team was consulted for assistance with chronic disease management and care coordination needs related to HTN, DM, CAD, and HLD.  Review of patient status, including review of consultants reports, relevant laboratory and other test results, and collaboration with appropriate care team members and the patient's provider was performed as part of comprehensive patient evaluation and provision of chronic care management services.    I spoke with Julia West by telephone today regarding management of her chronic medical conditions. She was appreciative of the call but does note have any CCM needs at this and would like to be removed from the program. She will reach out in the future if services are needed.  SDOH (Social Determinants of Health) assessments performed: Yes See Care Plan activities for detailed interventions related to SDOH    Medications: Outpatient Encounter Medications as of 10/03/2019  Medication Sig  . sertraline (ZOLOFT) 50 MG tablet TAKE 1 TABLET BY MOUTH  DAILY  . aspirin 81 MG tablet Take 81 mg by mouth daily.   . cetirizine (ZYRTEC) 10 MG tablet Take 1 tablet (10 mg total) by mouth daily.  . clotrimazole-betamethasone (LOTRISONE) cream Apply 1 application topically 2 (two) times daily. To affected areas until rash clears  . fenofibrate 160 MG tablet TAKE 1 TABLET BY MOUTH  DAILY FOR CHOLESTEROL AND  TRIGLYCERIDES  . Multiple Vitamins-Minerals (MULTIVITAMIN ADULT) TABS Take 1 tablet by mouth daily.  . nitroGLYCERIN (NITROSTAT) 0.4 MG SL tablet Place 1 tablet (0.4 mg total) under the tongue every 5 (five) minutes x 3 doses as needed for chest pain. If pain  persist after call 911  . traZODone (DESYREL) 150 MG tablet TAKE 1 OR 2 TABLETS BY  MOUTH AT BEDTIME AS NEEDED  FOR SLEEP  . valsartan-hydrochlorothiazide (DIOVAN-HCT) 160-12.5 MG tablet TAKE 1 TABLET BY MOUTH  DAILY   No facility-administered encounter medications on file as of 10/03/2019.    Lab Results  Component Value Date   HGBA1C 5.6 08/16/2019   HGBA1C 5.3 08/24/2017   HGBA1C 5.4 05/09/2017   Lab Results  Component Value Date   LDLCALC 73 08/16/2019   CREATININE 1.13 (H) 08/16/2019     Plan:   CCM enrollment status changed to "previously enrolled" as per patient request on 10/03/19 to discontinue enrollment. Case closed to case management services in primary care home.   Julia West, BSN, RN-BC Embedded Chronic Care Manager [facility] / [facility] Management Direct Dial: [phone]   g

## 2019-10-20 ENCOUNTER — Other Ambulatory Visit: Payer: Self-pay | Admitting: Family Medicine

## 2019-11-21 ENCOUNTER — Other Ambulatory Visit: Payer: Self-pay | Admitting: Family Medicine

## 2019-11-21 DIAGNOSIS — E785 Hyperlipidemia, unspecified: Secondary | ICD-10-CM

## 2019-11-27 ENCOUNTER — Encounter: Payer: Self-pay | Admitting: Family Medicine

## 2019-11-27 ENCOUNTER — Ambulatory Visit (INDEPENDENT_AMBULATORY_CARE_PROVIDER_SITE_OTHER): Payer: Medicare Other | Admitting: Family Medicine

## 2019-11-27 DIAGNOSIS — N3000 Acute cystitis without hematuria: Secondary | ICD-10-CM | POA: Diagnosis not present

## 2019-11-27 MED ORDER — CEPHALEXIN 500 MG PO CAPS: 500.0000 mg | ORAL_CAPSULE | Freq: Four times a day (QID) | ORAL | 0 refills | Status: DC

## 2019-11-27 NOTE — Progress Notes (Signed)
Virtual Visit via telephone Note  I connected with Julia West on 11/27/19 at 68 by telephone and verified that I am speaking with the correct person using two identifiers. CHRISLYN West is currently located at home and patient are currently with her during visit. The provider, Fransisca Kaufmann Aniston Christman, MD is located in their office at time of visit.  Call ended at 1734  I discussed the limitations, risks, security and privacy concerns of performing an evaluation and management service by telephone and the availability of in person appointments. I also discussed with the patient that there may be a patient responsible charge related to this service. The patient expressed understanding and agreed to proceed.   History and Present Illness: Patient is calling in for urinating frequently every 2 hours.  She awoke up this am and was having burning and frequency and abdominal pressure over the past 2 days.  She denies any fevers or burning. She gets the urinary tracts infections every now and then.    1. Acute cystitis without hematuria     Outpatient Encounter Medications as of 11/27/2019  Medication Sig  . sertraline (ZOLOFT) 50 MG tablet TAKE 1 TABLET BY MOUTH  DAILY  . aspirin 81 MG tablet Take 81 mg by mouth daily.   . cephALEXin (KEFLEX) 500 MG capsule Take 1 capsule (500 mg total) by mouth 4 (four) times daily.  . cetirizine (ZYRTEC) 10 MG tablet Take 1 tablet (10 mg total) by mouth daily.  . clotrimazole-betamethasone (LOTRISONE) cream Apply 1 application topically 2 (two) times daily. To affected areas until rash clears  . fenofibrate 160 MG tablet TAKE 1 TABLET BY MOUTH  DAILY FOR CHOLESTEROL AND  TRIGLYCERIDES  . Multiple Vitamins-Minerals (MULTIVITAMIN ADULT) TABS Take 1 tablet by mouth daily.  . nitroGLYCERIN (NITROSTAT) 0.4 MG SL tablet Place 1 tablet (0.4 mg total) under the tongue every 5 (five) minutes x 3 doses as needed for chest pain. If pain persist after call 911    . traZODone (DESYREL) 150 MG tablet TAKE 1 OR 2 TABLETS BY  MOUTH AT BEDTIME AS NEEDED  FOR SLEEP  . valsartan-hydrochlorothiazide (DIOVAN-HCT) 160-12.5 MG tablet TAKE 1 TABLET BY MOUTH  DAILY   No facility-administered encounter medications on file as of 11/27/2019.    Review of Systems  Constitutional: Negative for chills and fever.  Respiratory: Negative for chest tightness and shortness of breath.   Cardiovascular: Negative for chest pain and leg swelling.  Gastrointestinal: Positive for abdominal distention. Negative for abdominal pain.  Genitourinary: Positive for dysuria, frequency and urgency. Negative for difficulty urinating, hematuria, vaginal bleeding, vaginal discharge and vaginal pain.  Musculoskeletal: Negative for back pain and gait problem.  Skin: Negative for rash.  Neurological: Negative for light-headedness and headaches.  Psychiatric/Behavioral: Negative for agitation and behavioral problems.  All other systems reviewed and are negative.   Observations/Objective: Patient sounds comfortable and in no acute distress  Assessment and Plan: Problem List Items Addressed This Visit    None    Visit Diagnoses    Acute cystitis without hematuria    -  Primary   Relevant Medications   cephALEXin (KEFLEX) 500 MG capsule       Follow up plan: Return if symptoms worsen or fail to improve.     I discussed the assessment and treatment plan with the patient. The patient was provided an opportunity to ask questions and all were answered. The patient agreed with the plan and demonstrated an understanding of the  instructions.   The patient was advised to call back or seek an in-person evaluation if the symptoms worsen or if the condition fails to improve as anticipated.  The above assessment and management plan was discussed with the patient. The patient verbalized understanding of and has agreed to the management plan. Patient is aware to call the clinic if symptoms  persist or worsen. Patient is aware when to return to the clinic for a follow-up visit. Patient educated on when it is appropriate to go to the emergency department.    I provided 6 minutes of non-face-to-face time during this encounter.    Worthy Rancher, MD

## 2019-12-01 ENCOUNTER — Encounter: Payer: Self-pay | Admitting: Family Medicine

## 2019-12-03 DIAGNOSIS — N281 Cyst of kidney, acquired: Secondary | ICD-10-CM | POA: Diagnosis not present

## 2019-12-03 DIAGNOSIS — R300 Dysuria: Secondary | ICD-10-CM | POA: Diagnosis not present

## 2019-12-03 DIAGNOSIS — Z888 Allergy status to other drugs, medicaments and biological substances status: Secondary | ICD-10-CM | POA: Diagnosis not present

## 2019-12-03 DIAGNOSIS — K7689 Other specified diseases of liver: Secondary | ICD-10-CM | POA: Diagnosis not present

## 2019-12-03 DIAGNOSIS — K5730 Diverticulosis of large intestine without perforation or abscess without bleeding: Secondary | ICD-10-CM | POA: Diagnosis not present

## 2019-12-03 DIAGNOSIS — I10 Essential (primary) hypertension: Secondary | ICD-10-CM | POA: Diagnosis not present

## 2019-12-03 DIAGNOSIS — K439 Ventral hernia without obstruction or gangrene: Secondary | ICD-10-CM | POA: Diagnosis not present

## 2019-12-03 DIAGNOSIS — N390 Urinary tract infection, site not specified: Secondary | ICD-10-CM | POA: Diagnosis not present

## 2019-12-03 MED ORDER — BACLOFEN 10 MG PO TABS: 10.0000 mg | ORAL_TABLET | Freq: Three times a day (TID) | ORAL | 0 refills | Status: DC

## 2019-12-05 ENCOUNTER — Other Ambulatory Visit: Payer: Self-pay | Admitting: Family Medicine

## 2019-12-10 DIAGNOSIS — Z09 Encounter for follow-up examination after completed treatment for conditions other than malignant neoplasm: Secondary | ICD-10-CM | POA: Diagnosis not present

## 2019-12-10 DIAGNOSIS — Z87440 Personal history of urinary (tract) infections: Secondary | ICD-10-CM | POA: Diagnosis not present

## 2019-12-10 DIAGNOSIS — Z23 Encounter for immunization: Secondary | ICD-10-CM | POA: Diagnosis not present

## 2020-01-09 ENCOUNTER — Other Ambulatory Visit: Payer: Self-pay | Admitting: Family Medicine

## 2020-02-13 ENCOUNTER — Other Ambulatory Visit: Payer: Self-pay | Admitting: Family Medicine

## 2020-02-13 DIAGNOSIS — E785 Hyperlipidemia, unspecified: Secondary | ICD-10-CM

## 2020-02-14 ENCOUNTER — Ambulatory Visit: Payer: Medicare Other | Admitting: Family Medicine

## 2020-02-19 ENCOUNTER — Encounter: Payer: Self-pay | Admitting: Family Medicine

## 2020-02-19 ENCOUNTER — Other Ambulatory Visit: Payer: Self-pay

## 2020-02-19 ENCOUNTER — Ambulatory Visit (INDEPENDENT_AMBULATORY_CARE_PROVIDER_SITE_OTHER): Payer: Medicare Other | Admitting: Family Medicine

## 2020-02-19 VITALS — BP 134/79 | HR 89 | Temp 97.3°F | Resp 20 | Ht 67.0 in | Wt 282.4 lb

## 2020-02-19 DIAGNOSIS — Z89512 Acquired absence of left leg below knee: Secondary | ICD-10-CM

## 2020-02-19 DIAGNOSIS — E11610 Type 2 diabetes mellitus with diabetic neuropathic arthropathy: Secondary | ICD-10-CM | POA: Diagnosis not present

## 2020-02-19 DIAGNOSIS — E785 Hyperlipidemia, unspecified: Secondary | ICD-10-CM

## 2020-02-19 DIAGNOSIS — E782 Mixed hyperlipidemia: Secondary | ICD-10-CM

## 2020-02-19 DIAGNOSIS — H6591 Unspecified nonsuppurative otitis media, right ear: Secondary | ICD-10-CM | POA: Diagnosis not present

## 2020-02-19 DIAGNOSIS — I10 Essential (primary) hypertension: Secondary | ICD-10-CM | POA: Diagnosis not present

## 2020-02-19 LAB — CBC WITH DIFFERENTIAL/PLATELET
Basophils Absolute: 0.1 10*3/uL (ref 0.0–0.2)
Basos: 1 %
EOS (ABSOLUTE): 0.3 10*3/uL (ref 0.0–0.4)
Eos: 3 %
Hematocrit: 46.4 % (ref 34.0–46.6)
Hemoglobin: 15.6 g/dL (ref 11.1–15.9)
Immature Grans (Abs): 0 10*3/uL (ref 0.0–0.1)
Immature Granulocytes: 0 %
Lymphocytes Absolute: 4.7 10*3/uL — ABNORMAL HIGH (ref 0.7–3.1)
Lymphs: 44 %
MCH: 29.5 pg (ref 26.6–33.0)
MCHC: 33.6 g/dL (ref 31.5–35.7)
MCV: 88 fL (ref 79–97)
Monocytes Absolute: 0.8 10*3/uL (ref 0.1–0.9)
Monocytes: 7 %
Neutrophils Absolute: 4.8 10*3/uL (ref 1.4–7.0)
Neutrophils: 45 %
Platelets: 253 10*3/uL (ref 150–450)
RBC: 5.28 x10E6/uL (ref 3.77–5.28)
RDW: 14.3 % (ref 11.7–15.4)
WBC: 10.7 10*3/uL (ref 3.4–10.8)

## 2020-02-19 LAB — CMP14+EGFR
ALT: 11 IU/L (ref 0–32)
AST: 13 IU/L (ref 0–40)
Albumin/Globulin Ratio: 1.7 (ref 1.2–2.2)
Albumin: 4.3 g/dL (ref 3.8–4.8)
Alkaline Phosphatase: 44 IU/L (ref 44–121)
BUN/Creatinine Ratio: 16 (ref 12–28)
BUN: 18 mg/dL (ref 8–27)
Bilirubin Total: 0.4 mg/dL (ref 0.0–1.2)
CO2: 24 mmol/L (ref 20–29)
Calcium: 9.6 mg/dL (ref 8.7–10.3)
Chloride: 101 mmol/L (ref 96–106)
Creatinine, Ser: 1.14 mg/dL — ABNORMAL HIGH (ref 0.57–1.00)
GFR calc Af Amer: 58 mL/min/{1.73_m2} — ABNORMAL LOW (ref >59)
GFR calc non Af Amer: 50 mL/min/{1.73_m2} — ABNORMAL LOW (ref >59)
Globulin, Total: 2.6 g/dL (ref 1.5–4.5)
Glucose: 94 mg/dL (ref 65–99)
Potassium: 4.6 mmol/L (ref 3.5–5.2)
Sodium: 140 mmol/L (ref 134–144)
Total Protein: 6.9 g/dL (ref 6.0–8.5)

## 2020-02-19 LAB — LIPID PANEL
Chol/HDL Ratio: 3.7 ratio (ref 0.0–4.4)
Cholesterol, Total: 156 mg/dL (ref 100–199)
HDL: 42 mg/dL (ref >39)
LDL Chol Calc (NIH): 93 mg/dL (ref 0–99)
Triglycerides: 119 mg/dL (ref 0–149)
VLDL Cholesterol Cal: 21 mg/dL (ref 5–40)

## 2020-02-19 LAB — BAYER DCA HB A1C WAIVED: HB A1C (BAYER DCA - WAIVED): 5.3 % (ref <7.0)

## 2020-02-19 MED ORDER — AMOXICILLIN-POT CLAVULANATE 875-125 MG PO TABS: 1.0000 | ORAL_TABLET | Freq: Two times a day (BID) | ORAL | 0 refills | Status: DC

## 2020-02-19 MED ORDER — AMOXICILLIN-POT CLAVULANATE 875-125 MG PO TABS
1.0000 | ORAL_TABLET | Freq: Two times a day (BID) | ORAL | 0 refills | Status: DC
Start: 2020-02-19 — End: 2020-03-13

## 2020-02-19 NOTE — Progress Notes (Addendum)
Subjective:  Patient ID: Julia West, female    DOB: 04-27-1955  Age: 64 y.o. MRN: [id number]  CC: Medical Management of Chronic Issues   HPI Julia West presents for  presents for  follow-up of hypertension. Patient has no history of headache chest pain or shortness of breath or recent cough. Patient also denies symptoms of TIA such as focal numbness or weakness. Patient denies side effects from medication. States taking it regularly.   in for follow-up of elevated cholesterol. Doing well without complaints on current medication. Denies side effects of statin including myalgia and arthralgia and nausea. Currently no chest pain, shortness of breath or other cardiovascular related symptoms noted.  Patient was also seen for her left below the knee amputation.  She is in need of supplies and a new prosthesis for this.  She has developed pain and some skin breakdown due to the current supplies being worn out.  Depression screen [facility] 2/9 02/19/2020 08/16/2019 12/18/2018  Decreased Interest 0 0 0  Down, Depressed, Hopeless 0 0 0  PHQ - 2 Score 0 0 0  Altered sleeping - - -  Tired, decreased energy - - -  Change in appetite - - -  Feeling bad or failure about yourself  - - -  Trouble concentrating - - -  Moving slowly or fidgety/restless - - -  Suicidal thoughts - - -  PHQ-9 Score - - -  Difficult doing work/chores - - -  Some recent data might be hidden    History Julia West has a past medical history of Acute myocardial infarction, Anxiety, Cancer ([place]), Complication of anesthesia, Degenerative joint disease, Dehiscence of amputation stump ([place]), Dyspnea, HTN (hypertension), Hyperlipidemia LDL goal <70, Internal and external hemorrhoids without complication, Lymphedema, Lymphedema of arm, Morbid obesity ([place]), and Surgical wound, non healing (ABDOMINAL ).   She has a past surgical history that includes Cesarean section; Cholecystectomy; Coronary artery bypass graft (2003);  Incisional hernia repair (02/07/2012); Bowel resection (02/07/2012); Application if wound vac; Incision and drainage of wound (04/17/2012); Lesion removal (04/17/2012); Incision and drainage of wound (N/A, 04/24/2012); Incision and drainage of wound (N/A, 05/01/2012); Incision and drainage of wound (N/A, 05/08/2012); Cardiovascular stress test (01-17-2012  [facility]); Incision and drainage of wound (N/A, 05/15/2012); Incision and drainage of wound (N/A, 05/22/2012); Incision and drainage of wound (N/A, 06/28/2012); polypectomy (N/A, 05/14/2014); Hysteroscopy with D & C (N/A, 05/14/2014); ORIF toe fracture (Left, 02/09/2017); Amputation (Left, 05/06/2017); I & D extremity (Left, 06/21/2017); and Stump revision (Left, 08/31/2017).   Her family history includes Birth defects in her son; Diabetes in her brother; Heart attack in her mother; Hypertension in her sister.She reports that she quit smoking about 8 years ago. Her smoking use included cigarettes. She has a 25.00 pack-year smoking history. She has never used smokeless tobacco. She reports that she does not drink alcohol and does not use drugs.    ROS Review of Systems  Constitutional: Negative.   HENT: Positive for ear pain.   Eyes: Negative for visual disturbance.  Respiratory: Negative for shortness of breath.   Cardiovascular: Negative for chest pain.  Gastrointestinal: Negative for abdominal pain.  Musculoskeletal: Negative for arthralgias.    Objective:  BP 134/79   Pulse 89   Temp (!) 97.3 F (36.3 C) (Temporal)   Resp 20   Ht 5' 7" (1.702 m)   Wt 282 lb 6 oz (128.1 kg)   SpO2 95%   BMI 44.23 kg/m   BP Readings from Last  3 Encounters:  02/19/20 134/79  08/16/19 116/77  01/17/19 132/70    Wt Readings from Last 3 Encounters:  02/19/20 282 lb 6 oz (128.1 kg)  08/16/19 287 lb (130.2 kg)  01/17/19 280 lb (127 kg)     Physical Exam Constitutional:      General: She is not in acute distress.    Appearance: She is well-developed.  HENT:      Head: Normocephalic and atraumatic.     Ears:     Comments: Right TM with serous fluid behind right TM Eyes:     Conjunctiva/sclera: Conjunctivae normal.     Pupils: Pupils are equal, round, and reactive to light.  Neck:     Thyroid: No thyromegaly.  Cardiovascular:     Rate and Rhythm: Normal rate and regular rhythm.     Heart sounds: Normal heart sounds. No murmur heard.   Pulmonary:     Effort: Pulmonary effort is normal. No respiratory distress.     Breath sounds: Normal breath sounds. No wheezing or rales.  Abdominal:     General: Bowel sounds are normal. There is no distension.     Palpations: Abdomen is soft.     Tenderness: There is no abdominal tenderness.  Musculoskeletal:        General: Normal range of motion.     Cervical back: Normal range of motion and neck supple.  Lymphadenopathy:     Cervical: No cervical adenopathy.  Skin:    General: Skin is warm and dry.  Neurological:     Mental Status: She is alert and oriented to person, place, and time.  Psychiatric:        Behavior: Behavior normal.        Thought Content: Thought content normal.        Judgment: Judgment normal.       Assessment & Plan:   Julia West was seen today for medical management of chronic issues.  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 Take all of this medication -     Discontinue: amoxicillin-clavulanate (AUGMENTIN) 875-125 MG tablet; Take 1 tablet by mouth 2 (two) times daily. Take all of this medication   Discussed new supplies for her left BKA. To be ordered through [employer].    I have discontinued Amelda L. Santerre's cetirizine, clotrimazole-betamethasone, cephALEXin, baclofen, and amoxicillin-clavulanate. I am also having her maintain her aspirin, nitroGLYCERIN, Multivitamin Adult, sertraline, traZODone, and fenofibrate.  Allergies as of 02/19/2020      Reactions   Ace Inhibitors Cough      Medication List       Accurate as of February 19, 2020 11:59 PM. If you have any questions, ask your nurse or doctor.        STOP taking these medications   baclofen 10 MG tablet Commonly known as: LIORESAL Stopped by: [name], MD   cephALEXin 500 MG capsule Commonly known as: KEFLEX Stopped by: [name], MD   cetirizine 10 MG tablet Commonly known as: ZYRTEC Stopped by: [name], MD   clotrimazole-betamethasone cream Commonly known as: Lotrisone Stopped by: [name], MD     TAKE these medications   amoxicillin-clavulanate 875-125 MG tablet Commonly known as: AUGMENTIN Take 1 tablet by mouth 2 (two) times daily. Take all of this medication Started by: [name], MD   aspirin 81 MG tablet Take 81 mg by mouth daily.   fenofibrate 160 MG tablet TAKE 1 TABLET BY MOUTH  DAILY FOR CHOLESTEROL AND  TRIGLYCERIDES   Multivitamin Adult Tabs Take 1 tablet by mouth daily.   nitroGLYCERIN 0.4 MG SL tablet Commonly known as: NITROSTAT Place 1 tablet (0.4 mg total) under the tongue every 5 (five) minutes x 3 doses as needed for chest pain. If pain persist after call 911   sertraline 50 MG tablet Commonly known as: ZOLOFT TAKE 1 TABLET BY MOUTH  DAILY   traZODone 150 MG tablet Commonly known as: DESYREL TAKE 1 OR 2 TABLETS BY  MOUTH AT BEDTIME AS NEEDED  FOR SLEEP    valsartan-hydrochlorothiazide 160-12.5 MG tablet Commonly known as: DIOVAN-HCT TAKE 1 TABLET BY MOUTH  DAILY        Follow-up: Return in about 3 months (around 05/19/2020).  [name], M.D.

## 2020-02-22 ENCOUNTER — Ambulatory Visit: Payer: Medicare Other | Admitting: Family Medicine

## 2020-02-22 NOTE — Progress Notes (Signed)
Hello Koralee,  Your lab result is normal and/or stable.Some minor variations that are not significant are commonly marked abnormal, but do not represent any medical problem for you.  Best regards, Leenah Seidner, M.D.

## 2020-03-12 ENCOUNTER — Encounter: Payer: Self-pay | Admitting: Family Medicine

## 2020-03-13 ENCOUNTER — Other Ambulatory Visit: Payer: Self-pay | Admitting: Family Medicine

## 2020-03-13 MED ORDER — AMOXICILLIN-POT CLAVULANATE 875-125 MG PO TABS
1.0000 | ORAL_TABLET | Freq: Two times a day (BID) | ORAL | 0 refills | Status: DC
Start: 2020-03-13 — End: 2020-05-27

## 2020-03-17 NOTE — Progress Notes (Deleted)
Cardiology Office Note   Date:  03/17/2020   ID:  Julia West, DOB [birth year], MRN [id number]  PCP:  Julia Fraise, MD  Cardiologist:   No primary care provider on file.  No chief complaint on file.      History of Present Illness: Julia West is a 65 y.o. female who presented for evaluation of CAD.  Since I last saw her ***  *** she started walking with a prosthesis and crutches.  She is gained some of the weight back and she is disappointed.  However, she feels well. The patient denies any new symptoms such as chest discomfort, neck or arm discomfort. There has been no new shortness of breath, PND or orthopnea. There have been no reported palpitations, presyncope or syncope.    Past Medical History:  Diagnosis Date  . Acute myocardial infarction    with a ruptured plaque in the circumflex in 2003  . Anxiety   . Cancer ([place])    basal cell face  . Complication of anesthesia    states low O2 sats post-op 11/13, always slow to awaken  . Degenerative joint disease   . 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 Dyspnea    with activity- car to house - steps  . HTN (hypertension)   . Hyperlipidemia LDL goal <70   . Internal and external hemorrhoids without complication   . Lymphedema    right side of body  . Lymphedema of arm    right  . Morbid obesity ([place])   . Surgical wound, non healing ABDOMINAL    has wound vac @ 125 mm Hg    Past Surgical History:  Procedure Laterality Date  . AMPUTATION Left 05/06/2017   Procedure: BELOW KNEE AMPUTATION;  Surgeon: Newt Minion, MD;  Location: [place];  Service: Orthopedics;  Laterality: Left;  . APPLICATION OF WOUND VAC    . BOWEL RESECTION  02/07/2012   Procedure: SMALL BOWEL RESECTION;  Surgeon: Gayland Curry, MD,FACS;  Location: [place];  Service: General;;  . CARDIOVASCULAR STRESS TEST  01-17-2012  DR Alianny Toelle   LOW RISK NUCLEAR TEST  . CESAREAN SECTION     x 4 in remote past  . CHOLECYSTECTOMY     . CORONARY ARTERY BYPASS GRAFT  2003   by Dr. Servando Snare. LIMA to the LAD, free RIMA to the circumflex. Stress perfusion study December 2009 with no high-risk areas of ischemia. She has a well-preserved ejection fraction  . HYSTEROSCOPY WITH D & C N/A 05/14/2014   Procedure: DILATATION AND CURETTAGE (no specimen); HYSTEROSCOPY;  Surgeon: Jonnie Kind, MD;  [facility];  Service: Gynecology;  Laterality: N/A;  . I & D EXTREMITY Left 06/21/2017   Procedure: IRRIGATION AND DEBRIDEMENT OF LEFT LEG AMPUTATION SITE;  Surgeon: Wallace Going, DO;  [facility]: [facility];  Service: Plastics;  Laterality: Left;  . INCISION AND DRAINAGE OF WOUND  04/17/2012   Procedure: IRRIGATION AND DEBRIDEMENT WOUND;  Surgeon: Theodoro Kos, DO;  Location: [place];  Service: Plastics;  Laterality: N/A;  OF ABDOMINAL WOUND, SURGICAL PREP AND PLACEMENT OF VAC, REMOVAL FOREHEAD SKIN LESION  . INCISION AND DRAINAGE OF WOUND N/A 04/24/2012   Procedure: IRRIGATION AND DEBRIDEMENT WOUND;  Surgeon: Theodoro Kos, DO;  Location: [place];  Service: Plastics;  Laterality: N/A;  I & D ABDOMINAL WOUND WITH VAC AND ACELL  . INCISION AND DRAINAGE OF WOUND N/A 05/01/2012  Procedure: IRRIGATION AND DEBRIDEMENT WOUND;  Surgeon: Julia Denis, DO;  Location: [facility];  Service: Plastics;  Laterality: N/A;  WITH SURGICAL PREP AND PLACEMENT OF VAC  . INCISION AND DRAINAGE OF WOUND N/A 05/08/2012   Procedure: IRRIGATION AND DEBRIDEMENT OF ABD WOUND SURGICAL PREP AND PLACEMENT OF VAC ;  Surgeon: Julia Denis, DO;  Location: [facility];  Service: Plastics;  Laterality: N/A;  IRRIGATION AND DEBRIDEMENT OF ABD WOUND SURGICAL PREP AND PLACEMENT OF VAC   . INCISION AND DRAINAGE OF WOUND N/A 05/15/2012   Procedure: IRRIGATION AND DEBRIDEMENT OF ABDOMINAL WOUND WITH POSSIBLE SURGICAL PREP AND PLACEMENT OF VAC;  Surgeon: Julia Denis, DO;  Location: [facility];   Service: Plastics;  Laterality: N/A;  . INCISION AND DRAINAGE OF WOUND N/A 05/22/2012   Procedure: IRRIGATION AND DEBRIDEMENT OF ABODOMINAL WOUND WITH  SURGICAL PREP AND VAC PLACEMENT;  Surgeon: Julia Denis, DO;  Location: [facility];  Service: Plastics;  Laterality: N/A;  . INCISION AND DRAINAGE OF WOUND N/A 06/28/2012   Procedure: IRRIGATION AND DEBRIDEMENT OF ABDOMINAL ULCER SURGICAL PREP AND PLACEMENT OF ACELL AND VAC;  Surgeon: Julia Denis, DO;  Location:  [place];  Service: Plastics;  Laterality: N/A;  . INCISIONAL HERNIA REPAIR  02/07/2012   Procedure: HERNIA REPAIR INCISIONAL;  Surgeon: [name], MD,FACS;  [facility];  Service: General;;  Open, Primary repair, strangulated Incisional hernia.  Julia West LESION REMOVAL  04/17/2012   Procedure: LESION REMOVAL;  Surgeon: Julia Denis, DO;  Location: [facility] ;  Service: Plastics;  Laterality: N/A;   CENTER OF FOREHEAD, REMOVAL FORHEAD SKIN LESION  . ORIF TOE FRACTURE Left 02/09/2017   Procedure: EXCISION TALAR HEAD, INTERNAL FIXATION MEDIAL COLUMN LEFT FOOT;  Surgeon: Julia Mustard, MD;  [facility];  Service: Orthopedics;  Laterality: Left;  . POLYPECTOMY N/A 05/14/2014   Procedure: ENDOMETRIAL POLYPECTOMY;  Surgeon: Julia Burrow, MD;  [facility];  Service: Gynecology;  Laterality: N/A;  . STUMP REVISION Left 08/31/2017   Procedure: REVISION LEFT BELOW KNEE AMPUTATION;  Surgeon: Julia Mustard, MD;  [facility];  Service: Orthopedics;  Laterality: Left;     Current Outpatient Medications  Medication Sig Dispense Refill  . amoxicillin-clavulanate (AUGMENTIN) 875-125 MG tablet Take 1 tablet by mouth 2 (two) times daily. Take all of this medication 20 tablet 0  . aspirin 81 MG tablet Take 81 mg by mouth daily.     . fenofibrate 160 MG tablet TAKE 1 TABLET BY MOUTH  DAILY FOR CHOLESTEROL AND  TRIGLYCERIDES 90 tablet 0  . Multiple Vitamins-Minerals (MULTIVITAMIN ADULT) TABS  Take 1 tablet by mouth daily.    . nitroGLYCERIN (NITROSTAT) 0.4 MG SL tablet Place 1 tablet (0.4 mg total) under the tongue every 5 (five) minutes x 3 doses as needed for chest pain. If pain persist after call 911 25 tablet 6  . sertraline (ZOLOFT) 50 MG tablet TAKE 1 TABLET BY MOUTH  DAILY 90 tablet 3  . traZODone (DESYREL) 150 MG tablet TAKE 1 OR 2 TABLETS BY  MOUTH AT BEDTIME AS NEEDED  FOR SLEEP 180 tablet 3  . valsartan-hydrochlorothiazide (DIOVAN-HCT) 160-12.5 MG tablet TAKE 1 TABLET BY MOUTH  DAILY 90 tablet 0   No current facility-administered medications for this visit.    Allergies:   Ace inhibitors    ROS:  Please see the history of present illness.   Otherwise, review of systems are positive for none.  All other systems are reviewed and negative.    PHYSICAL EXAM: VS:  There were no vitals taken for this visit. , BMI There is no height or weight on file to calculate BMI.  GENERAL:  Well appearing NECK:  No jugular venous distention, waveform within normal limits, carotid upstroke brisk and symmetric, no bruits, no thyromegaly LUNGS:  Clear to auscultation bilaterally CHEST:  Unremarkable HEART:  PMI not displaced or sustained,S1 and S2 within normal limits, no S3, no S4, no clicks, no rubs, *** murmurs ABD:  Flat, positive bowel sounds normal in frequency in pitch, no bruits, no rebound, no guarding, no midline pulsatile mass, no hepatomegaly, no splenomegaly EXT:  2 plus pulses throughout, no edema, no cyanosis no clubbing    ***NECK:  No jugular venous distention at 90 degrees, waveform within normal limits, carotid upstroke brisk and symmetric, no bruits, no thyromegaly LUNGS:  Clear to auscultation bilaterally CHEST:  Unremarkable HEART:  S1 and S2 within normal limits, no S3, no S4, no clicks, no rubs, no murmurs ABD:  Positive bowel sounds normal in frequency in pitch, no bruits, no rebound, no guarding, unable to assess midline mass or bruit with the patient  seated. EXT:  2 plus pulses throughout, no edema, no cyanosis no clubbing status post left lower extremity amputation, right leg lymphedema   EKG:  EKG *** ordered today. Sinus rhythm, rate ***, axis within normal limits, intervals within normal limits, poor anterior R wave progression, no acute ST-T wave changes.  Recent Labs: 02/19/2020: ALT 11; BUN 18; Creatinine, Ser 1.14; Hemoglobin 15.6; Platelets 253; Potassium 4.6; Sodium 140    Lipid Panel    Component Value Date/Time   CHOL 156 02/19/2020 0858   TRIG 119 02/19/2020 0858   TRIG 247 (H) 09/12/2013 1137   HDL 42 02/19/2020 0858   HDL 35 (L) 09/12/2013 1137   CHOLHDL 3.7 02/19/2020 0858   LDLCALC 93 02/19/2020 0858   LDLCALC 115 (H) 09/12/2013 1137      Wt Readings from Last 3 Encounters:  02/19/20 282 lb 6 oz (128.1 kg)  08/16/19 287 lb (130.2 kg)  01/17/19 280 lb (127 kg)      Other studies Reviewed: Additional studies/ records that were reviewed today include: *** Review of the above records demonstrates: ***   ASSESSMENT AND PLAN:  CAD:  *** The patient has no new sypmtoms.  No further cardiovascular testing is indicated.  We will continue with aggressive risk reduction and meds as listed.  SINUS BRADYCARDIA: She tolerates this.  *** No change in therapy.  We are avoiding AV nodal blocking agents.  HTN:  The blood pressure is *** at target.  No change in therapy.   DYSLIPIDEMIA:  Lipids are *** excellent with an LDL of 69 and HDL of 38.  No change in therapy.   Current medicines are reviewed at length with the patient today.  The patient does not have concerns regarding medicines.  The following changes have been made:  None  Labs/ tests ordered today include: None  No orders of the defined types were placed in this encounter.    Disposition:   FU with me in 1 year   Signed, [name], MD  03/17/2020 8:03 AM    [place]

## 2020-03-19 ENCOUNTER — Ambulatory Visit: Payer: Medicare Other | Admitting: Cardiology

## 2020-03-19 DIAGNOSIS — I10 Essential (primary) hypertension: Secondary | ICD-10-CM

## 2020-03-19 DIAGNOSIS — E785 Hyperlipidemia, unspecified: Secondary | ICD-10-CM

## 2020-03-19 DIAGNOSIS — R001 Bradycardia, unspecified: Secondary | ICD-10-CM

## 2020-03-19 DIAGNOSIS — I2510 Atherosclerotic heart disease of native coronary artery without angina pectoris: Secondary | ICD-10-CM

## 2020-04-22 ENCOUNTER — Telehealth: Payer: Self-pay

## 2020-04-23 ENCOUNTER — Telehealth: Payer: Self-pay | Admitting: Family Medicine

## 2020-04-23 NOTE — Telephone Encounter (Signed)
LMOVM notes faxed to [employer]

## 2020-04-23 NOTE — Telephone Encounter (Signed)
See telephone message sent to you yesterday about patient's face to face appointment

## 2020-04-23 NOTE — Telephone Encounter (Signed)
Pt is calling back to check on supplies for her amputated leg

## 2020-04-23 NOTE — Telephone Encounter (Signed)
Completed in previous phone encounter

## 2020-04-23 NOTE — Telephone Encounter (Signed)
This has been done and forwarded to [facility]

## 2020-04-23 NOTE — Telephone Encounter (Signed)
I did the requested addendum. Please process per pt. Request. Thanks, Cletus Gash

## 2020-04-29 NOTE — Progress Notes (Deleted)
Cardiology Office Note   Date:  04/29/2020   ID:  Julia West, DOB 01-26-1956, MRN [id number]  PCP:  [name], MD  Cardiologist:   No primary care provider on file.  No chief complaint on file.      History of Present Illness: Julia West is a 65 y.o. female who presented for evaluation of CAD.  Since I last saw her ***   *** she started walking with a prosthesis and crutches.  She is gained some of the weight back and she is disappointed.  However, she feels well. The patient denies any new symptoms such as chest discomfort, neck or arm discomfort. There has been no new shortness of breath, PND or orthopnea. There have been no reported palpitations, presyncope or syncope.    Past Medical History:  Diagnosis Date  . Acute myocardial infarction    with a ruptured plaque in the circumflex in 2003  . Anxiety   . Cancer ([place])    basal cell face  . Complication of anesthesia    states low O2 sats post-op 11/13, always slow to awaken  . Degenerative joint disease   . 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 Dyspnea    with activity- car to house - steps  . HTN (hypertension)   . Hyperlipidemia LDL goal <70   . Internal and external hemorrhoids without complication   . Lymphedema    right side of body  . Lymphedema of arm    right  . Morbid obesity ([place])   . Surgical wound, non healing ABDOMINAL    has wound vac @ 125 mm Hg    Past Surgical History:  Procedure Laterality Date  . AMPUTATION Left 05/06/2017   Procedure: BELOW KNEE AMPUTATION;  Surgeon: Newt Minion, MD;  Location: [place];  Service: Orthopedics;  Laterality: Left;  . APPLICATION OF WOUND VAC    . BOWEL RESECTION  02/07/2012   Procedure: SMALL BOWEL RESECTION;  Surgeon: Gayland Curry, MD,FACS;  Location: [place];  Service: General;;  . CARDIOVASCULAR STRESS TEST  01-17-2012  DR Kenly Xiao   LOW RISK NUCLEAR TEST  . CESAREAN SECTION     x 4 in remote past  . CHOLECYSTECTOMY     . CORONARY ARTERY BYPASS GRAFT  2003   by Dr. Servando Snare. LIMA to the LAD, free RIMA to the circumflex. Stress perfusion study December 2009 with no high-risk areas of ischemia. She has a well-preserved ejection fraction  . HYSTEROSCOPY WITH D & C N/A 05/14/2014   Procedure: DILATATION AND CURETTAGE (no specimen); HYSTEROSCOPY;  Surgeon: Jonnie Kind, MD;  [facility];  Service: Gynecology;  Laterality: N/A;  . I & D EXTREMITY Left 06/21/2017   Procedure: IRRIGATION AND DEBRIDEMENT OF LEFT LEG AMPUTATION SITE;  Surgeon: Wallace Going, DO;  [facility];  Service: Plastics;  Laterality: Left;  . INCISION AND DRAINAGE OF WOUND  04/17/2012   Procedure: IRRIGATION AND DEBRIDEMENT WOUND;  Surgeon: Theodoro Kos, DO;  Location: [place];  Service: Plastics;  Laterality: N/A;  OF ABDOMINAL WOUND, SURGICAL PREP AND PLACEMENT OF VAC, REMOVAL FOREHEAD SKIN LESION  . INCISION AND DRAINAGE OF WOUND N/A 04/24/2012   Procedure: IRRIGATION AND DEBRIDEMENT WOUND;  Surgeon: Theodoro Kos, DO;  Location: [place];  Service: Plastics;  Laterality: N/A;  I & D ABDOMINAL WOUND WITH VAC AND ACELL  . INCISION AND DRAINAGE OF WOUND N/A 05/01/2012  Procedure: IRRIGATION AND DEBRIDEMENT WOUND;  Surgeon: Theodoro Kos, DO;  Location: [place];  Service: Plastics;  Laterality: N/A;  WITH SURGICAL PREP AND PLACEMENT OF VAC  . INCISION AND DRAINAGE OF WOUND N/A 05/08/2012   Procedure: IRRIGATION AND DEBRIDEMENT OF ABD WOUND SURGICAL PREP AND PLACEMENT OF VAC ;  Surgeon: Theodoro Kos, DO;  Location: [place];  Service: Plastics;  Laterality: N/A;  IRRIGATION AND DEBRIDEMENT OF ABD WOUND SURGICAL PREP AND PLACEMENT OF VAC   . INCISION AND DRAINAGE OF WOUND N/A 05/15/2012   Procedure: IRRIGATION AND DEBRIDEMENT OF ABDOMINAL WOUND WITH POSSIBLE SURGICAL PREP AND PLACEMENT OF VAC;  Surgeon: Theodoro Kos, DO;  Location: [place];   Service: Plastics;  Laterality: N/A;  . INCISION AND DRAINAGE OF WOUND N/A 05/22/2012   Procedure: IRRIGATION AND DEBRIDEMENT OF ABODOMINAL WOUND WITH  SURGICAL PREP AND VAC PLACEMENT;  Surgeon: Theodoro Kos, DO;  Location: [place];  Service: Plastics;  Laterality: N/A;  . INCISION AND DRAINAGE OF WOUND N/A 06/28/2012   Procedure: IRRIGATION AND DEBRIDEMENT OF ABDOMINAL ULCER SURGICAL PREP AND PLACEMENT OF ACELL AND VAC;  Surgeon: Theodoro Kos, DO;  Location: [place];  Service: Plastics;  Laterality: N/A;  . INCISIONAL HERNIA REPAIR  02/07/2012   Procedure: HERNIA REPAIR INCISIONAL;  Surgeon: Gayland Curry, MD,FACS;  Location: [place];  Service: General;;  Open, Primary repair, strangulated Incisional hernia.  Marland Kitchen LESION REMOVAL  04/17/2012   Procedure: LESION REMOVAL;  Surgeon: Theodoro Kos, DO;  Location: [place];  Service: Plastics;  Laterality: N/A;   CENTER OF FOREHEAD, REMOVAL FORHEAD SKIN LESION  . ORIF TOE FRACTURE Left 02/09/2017   Procedure: EXCISION TALAR HEAD, INTERNAL FIXATION MEDIAL COLUMN LEFT FOOT;  Surgeon: Newt Minion, MD;  Location: [place];  Service: Orthopedics;  Laterality: Left;  . POLYPECTOMY N/A 05/14/2014   Procedure: ENDOMETRIAL POLYPECTOMY;  Surgeon: Jonnie Kind, MD;  Location[facility];  Service: Gynecology;  Laterality: N/A;  . STUMP REVISION Left 08/31/2017   Procedure: REVISION LEFT BELOW KNEE AMPUTATION;  Surgeon: Newt Minion, MD;  Location: [place];  Service: Orthopedics;  Laterality: Left;     Current Outpatient Medications  Medication Sig Dispense Refill  . amoxicillin-clavulanate (AUGMENTIN) 875-125 MG tablet Take 1 tablet by mouth 2 (two) times daily. Take all of this medication 20 tablet 0  . aspirin 81 MG tablet Take 81 mg by mouth daily.     . fenofibrate 160 MG tablet TAKE 1 TABLET BY MOUTH  DAILY FOR CHOLESTEROL AND  TRIGLYCERIDES 90 tablet 0  . Multiple Vitamins-Minerals (MULTIVITAMIN ADULT) TABS  Take 1 tablet by mouth daily.    . nitroGLYCERIN (NITROSTAT) 0.4 MG SL tablet Place 1 tablet (0.4 mg total) under the tongue every 5 (five) minutes x 3 doses as needed for chest pain. If pain persist after call 911 25 tablet 6  . sertraline (ZOLOFT) 50 MG tablet TAKE 1 TABLET BY MOUTH  DAILY 90 tablet 3  . traZODone (DESYREL) 150 MG tablet TAKE 1 OR 2 TABLETS BY  MOUTH AT BEDTIME AS NEEDED  FOR SLEEP 180 tablet 3  . valsartan-hydrochlorothiazide (DIOVAN-HCT) 160-12.5 MG tablet TAKE 1 TABLET BY MOUTH  DAILY 90 tablet 1   No current facility-administered medications for this visit.    Allergies:   Ace inhibitors    ROS:  Please see the history of present illness.   Otherwise, review of systems are positive for ***.  All other systems are reviewed and negative.    PHYSICAL EXAM: VS:  There were no vitals taken for this visit. , BMI There is no height or weight on file to calculate BMI. GEN:  No distress NECK:  No jugular venous distention at 90 degrees, waveform within normal limits, carotid upstroke brisk and symmetric, no bruits, no thyromegaly LYMPHATICS:  No cervical adenopathy LUNGS:  Clear to auscultation bilaterally BACK:  No CVA tenderness CHEST:  Unremarkable HEART:  S1 and S2 within normal limits, no S3, no S4, no clicks, no rubs, *** murmurs ABD:  Positive bowel sounds normal in frequency in pitch, no bruits, no rebound, no guarding, unable to assess midline mass or bruit with the patient seated. EXT:  2 plus pulses throughout, moderate edema, no cyanosis no clubbing, amputation SKIN:  No rashes no nodules NEURO:  Cranial nerves II through XII grossly intact, motor grossly intact throughout PSYCH:  Cognitively intact, oriented to person place and time   ***  NECK:  No jugular venous distention at 90 degrees, waveform within normal limits, carotid upstroke brisk and symmetric, no bruits, no thyromegaly LUNGS:  Clear to auscultation bilaterally CHEST:  Unremarkable HEART:  S1  and S2 within normal limits, no S3, no S4, no clicks, no rubs, no murmurs ABD:  Positive bowel sounds normal in frequency in pitch, no bruits, no rebound, no guarding, unable to assess midline mass or bruit with the patient seated. EXT:  2 plus pulses throughout, no edema, no cyanosis no clubbing status post left lower extremity amputation, right leg lymphedema   EKG:  EKG is  ordered today. Sinus rhythm, rate 55, axis within normal limits, intervals within normal limits, poor anterior R wave progression, no acute ST-T wave changes.  Recent Labs: 02/19/2020: ALT 11; BUN 18; Creatinine, Ser 1.14; Hemoglobin 15.6; Platelets 253; Potassium 4.6; Sodium 140    Lipid Panel    Component Value Date/Time   CHOL 156 02/19/2020 0858   TRIG 119 02/19/2020 0858   TRIG 247 (H) 09/12/2013 1137   HDL 42 02/19/2020 0858   HDL 35 (L) 09/12/2013 1137   CHOLHDL 3.7 02/19/2020 0858   LDLCALC 93 02/19/2020 0858   LDLCALC 115 (H) 09/12/2013 1137      Wt Readings from Last 3 Encounters:  02/19/20 282 lb 6 oz (128.1 kg)  08/16/19 287 lb (130.2 kg)  01/17/19 280 lb (127 kg)      Other studies Reviewed: Additional studies/ records that were reviewed today include: Labs Review of the above records demonstrates: See below   ASSESSMENT AND PLAN:  CAD:  *** The patient has no new sypmtoms.  No further cardiovascular testing is indicated.  We will continue with aggressive risk reduction and meds as listed.  SINUS BRADYCARDIA: She tolerates this.  *** No change in therapy.  We are avoiding AV nodal blocking agents.  HTN:  The blood pressure is ***at target.  No change in therapy.   DYSLIPIDEMIA:  Lipids are excellent with an LDL of *** 69 and HDL of 38.  No change in therapy.   Current medicines are reviewed at length with the patient today.  The patient does not have concerns regarding medicines.  The following changes have been made:  None  Labs/ tests ordered today include: None  No orders of  the defined types were placed in this encounter.    Disposition:   FU with me in 1 year   Signed, [name], MD  04/29/2020 8:14 PM  [facility]

## 2020-04-30 ENCOUNTER — Ambulatory Visit: Payer: Medicare Other | Admitting: Cardiology

## 2020-04-30 DIAGNOSIS — I2510 Atherosclerotic heart disease of native coronary artery without angina pectoris: Secondary | ICD-10-CM

## 2020-04-30 DIAGNOSIS — R001 Bradycardia, unspecified: Secondary | ICD-10-CM

## 2020-04-30 DIAGNOSIS — E785 Hyperlipidemia, unspecified: Secondary | ICD-10-CM

## 2020-05-05 ENCOUNTER — Other Ambulatory Visit: Payer: Self-pay | Admitting: Family Medicine

## 2020-05-05 DIAGNOSIS — E785 Hyperlipidemia, unspecified: Secondary | ICD-10-CM

## 2020-05-15 DIAGNOSIS — Z89512 Acquired absence of left leg below knee: Secondary | ICD-10-CM | POA: Diagnosis not present

## 2020-05-27 ENCOUNTER — Ambulatory Visit: Payer: Medicare Other | Admitting: Cardiology

## 2020-05-27 ENCOUNTER — Encounter: Payer: Self-pay | Admitting: Cardiology

## 2020-05-27 ENCOUNTER — Ambulatory Visit (INDEPENDENT_AMBULATORY_CARE_PROVIDER_SITE_OTHER): Payer: Medicare Other

## 2020-05-27 ENCOUNTER — Ambulatory Visit (INDEPENDENT_AMBULATORY_CARE_PROVIDER_SITE_OTHER): Payer: Medicare Other | Admitting: Family Medicine

## 2020-05-27 ENCOUNTER — Other Ambulatory Visit: Payer: Self-pay

## 2020-05-27 ENCOUNTER — Encounter: Payer: Self-pay | Admitting: *Deleted

## 2020-05-27 ENCOUNTER — Encounter (INDEPENDENT_AMBULATORY_CARE_PROVIDER_SITE_OTHER): Payer: Self-pay | Admitting: Family Medicine

## 2020-05-27 ENCOUNTER — Telehealth: Payer: Self-pay | Admitting: Cardiology

## 2020-05-27 VITALS — BP 132/72 | HR 97 | Ht 62.0 in | Wt 279.0 lb

## 2020-05-27 VITALS — BP 128/80 | HR 97 | Temp 97.5°F | Ht 62.0 in | Wt 279.0 lb

## 2020-05-27 DIAGNOSIS — R001 Bradycardia, unspecified: Secondary | ICD-10-CM

## 2020-05-27 DIAGNOSIS — Z6841 Body Mass Index (BMI) 40.0 and over, adult: Secondary | ICD-10-CM

## 2020-05-27 DIAGNOSIS — K760 Fatty (change of) liver, not elsewhere classified: Secondary | ICD-10-CM | POA: Diagnosis not present

## 2020-05-27 DIAGNOSIS — I2510 Atherosclerotic heart disease of native coronary artery without angina pectoris: Secondary | ICD-10-CM | POA: Diagnosis not present

## 2020-05-27 DIAGNOSIS — E7849 Other hyperlipidemia: Secondary | ICD-10-CM

## 2020-05-27 DIAGNOSIS — R739 Hyperglycemia, unspecified: Secondary | ICD-10-CM

## 2020-05-27 DIAGNOSIS — I10 Essential (primary) hypertension: Secondary | ICD-10-CM

## 2020-05-27 DIAGNOSIS — R5383 Other fatigue: Secondary | ICD-10-CM | POA: Diagnosis not present

## 2020-05-27 DIAGNOSIS — Z1331 Encounter for screening for depression: Secondary | ICD-10-CM | POA: Diagnosis not present

## 2020-05-27 DIAGNOSIS — E785 Hyperlipidemia, unspecified: Secondary | ICD-10-CM

## 2020-05-27 DIAGNOSIS — E559 Vitamin D deficiency, unspecified: Secondary | ICD-10-CM | POA: Diagnosis not present

## 2020-05-27 DIAGNOSIS — Z0289 Encounter for other administrative examinations: Secondary | ICD-10-CM

## 2020-05-27 DIAGNOSIS — I4891 Unspecified atrial fibrillation: Secondary | ICD-10-CM

## 2020-05-27 DIAGNOSIS — R0602 Shortness of breath: Secondary | ICD-10-CM

## 2020-05-27 MED ORDER — APIXABAN 5 MG PO TABS: 5.0000 mg | ORAL_TABLET | Freq: Two times a day (BID) | ORAL | 6 refills | Status: DC

## 2020-05-27 NOTE — Progress Notes (Signed)
Patient ID: Julia West, female   DOB: August 30, 1955, 65 y.o.   MRN: [id number] Patient enrolled for [employer] to ship a 3 day ZIO XT long terrm holter monitor to her home.

## 2020-05-27 NOTE — Patient Instructions (Signed)
Medication Instructions:  STOP- Aspirin START- Eliquis 5 mg by mouth twice a day  *If you need a refill on your cardiac medications before your next appointment, please call your pharmacy*   Lab Work: None Ordered   Testing/Procedures: Your physician has requested that you have an echocardiogram. Echocardiography is a painless test that uses sound waves to create images of your heart. It provides your doctor with information about the size and shape of your heart and how well your heart's chambers and valves are working. This procedure takes approximately one hour. There are no restrictions for this procedure.  Your physician has recommended that you wear a 3 day Zio monitor. Holter monitors are medical devices that record the heart's electrical activity. Doctors most often use these monitors to diagnose arrhythmias. Arrhythmias are problems with the speed or rhythm of the heartbeat. The monitor is a small, portable device. You can wear one while you do your normal daily activities. This is usually used to diagnose what is causing palpitations/syncope (passing out).   Follow-Up: At [facility], you and your health needs are our priority.  As part of our continuing mission to provide you with exceptional heart care, we have created designated Provider Care Teams.  These Care Teams include your primary Cardiologist (physician) and Advanced Practice Providers (APPs -  Physician Assistants and Nurse Practitioners) who all work together to provide you with the care you need, when you need it.  We recommend signing up for the patient portal called "MyChart".  Sign up information is provided on this After Visit Summary.  MyChart is used to connect with patients for Virtual Visits (Telemedicine).  Patients are able to view lab/test results, encounter notes, upcoming appointments, etc.  Non-urgent messages can be sent to your provider as well.   To learn more about what you can do with MyChart, go to  [email or web address].    Your next appointment:   1 month(s)  The format for your next appointment:   In Person  Provider:   [name], MD in [place]:  [place] Instructions   Your physician has requested you wear your ZIO patch monitor 3 days.   This is a single patch monitor.  [employer] supplies one patch monitor per enrollment.  Additional stickers are not available.   Please do not apply patch if you will be having a Nuclear Stress Test, Echocardiogram, Cardiac CT, MRI, or Chest Xray during the time frame you would be wearing the monitor. The patch cannot be worn during these tests.  You cannot remove and re-apply the ZIO XT patch monitor.   Your ZIO patch monitor will be sent USPS Priority mail from [facility] directly to your home address. The monitor may also be mailed to a PO BOX if home delivery is not available.   It may take 3-5 days to receive your monitor after you have been enrolled.   Once you have received you monitor, please review enclosed instructions.  Your monitor has already been registered assigning a specific monitor serial # to you.   Applying the monitor   Shave hair from upper left chest.   Hold abrader disc by orange tab.  Rub abrader in 40 strokes over left upper chest as indicated in your monitor instructions.   Clean area with 4 enclosed alcohol pads .  Use all pads to assure are is cleaned thoroughly.  Let dry.   Apply patch as indicated in monitor instructions.  Patch  will be place under collarbone on left side of chest with arrow pointing upward.   Rub patch adhesive wings for 2 minutes.Remove white label marked "1".  Remove white label marked "2".  Rub patch adhesive wings for 2 additional minutes.   While looking in a mirror, press and release button in center of patch.  A small green light will flash 3-4 times .  This will be your only indicator the monitor has been turned on.     Do not shower for  the first 24 hours.  You may shower after the first 24 hours.   Press button if you feel a symptom. You will hear a small click.  Record Date, Time and Symptom in the Patient Log Book.   When you are ready to remove patch, follow instructions on last 2 pages of Patient Log Book.  Stick patch monitor onto last page of Patient Log Book.   Place Patient Log Book in [place] box.  Use locking tab on box and tape box closed securely.  The [employer] and [employer] has [employer] on it.  Please place in mailbox as soon as possible.  Your physician should have your test results approximately 7 days after the monitor has been mailed back to [facility].   Call [employer] [place] at [phone] if you have questions regarding your ZIO XT patch monitor.  Call them immediately if you see an orange light blinking on your monitor.   If your monitor falls off in less than 4 days contact our Monitor department at [phone].  If your monitor becomes loose or falls off after 4 days call [employer] at [phone] for suggestions on securing your monitor.

## 2020-05-27 NOTE — Progress Notes (Signed)
Cardiology Office Note   Date:  05/27/2020   ID:  Julia West, DOB 1955/11/27, MRN [id number]  PCP:  [name], MD  Cardiologist:   No primary care provider on file.  Chief Complaint  Patient presents with  . Atrial Fibrillation       History of Present Illness: Julia West is a 65 y.o. female who presented for evaluation of CAD.  Since I last saw her she continues to lose weight.  In fact she is working with [facility] [employer] [facility] where she was today when she was noted to be in atrial fibrillation.  She actually did not feel that.  She does not have palpitations and has not had any presyncope or syncope.  She has had no new shortness of breath, PND or orthopnea.  She had no chest pressure, neck or arm discomfort.  She status post amputation and she walks with crutches and has felt better as she has lost weight.  She has never had atrial fibrillation before.    Past Medical History:  Diagnosis Date  . Acute myocardial infarction    with a ruptured plaque in the circumflex in 2003  . Anxiety   . Bilateral swelling of feet   . Cancer ([place])    basal cell face  . Chest pain   . Complication of anesthesia    states low O2 sats post-op 11/13, always slow to awaken  . Degenerative joint disease   . 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 Depression   . Dyspnea    with activity- car to house - steps  . Fatty liver   . Gallbladder problem   . Heart disease   . HTN (hypertension)   . Hyperlipidemia LDL goal <70   . Incarcerated hernia   . Internal and external hemorrhoids without complication   . Joint pain   . Lymphedema    right side of body  . Lymphedema of arm    right  . Morbid obesity ([place])   . Rheumatoid arthritis ([place])   . Surgical wound, non healing ABDOMINAL    has wound vac @ 125 mm Hg  . Ventral hernia     Past Surgical History:  Procedure Laterality Date  . AMPUTATION Left 05/06/2017   Procedure: BELOW KNEE  AMPUTATION;  Surgeon: Newt Minion, MD;  Location: [place];  Service: Orthopedics;  Laterality: Left;  . APPLICATION OF WOUND VAC    . BOWEL RESECTION  02/07/2012   Procedure: SMALL BOWEL RESECTION;  Surgeon: Gayland Curry, MD,FACS;  Location: [place];  Service: General;;  . CARDIOVASCULAR STRESS TEST  01-17-2012  DR Hlee Fringer   LOW RISK NUCLEAR TEST  . CESAREAN SECTION     x 4 in remote past  . CHOLECYSTECTOMY    . CORONARY ARTERY BYPASS GRAFT  2003   by Dr. Servando Snare. LIMA to the LAD, free RIMA to the circumflex. Stress perfusion study December 2009 with no high-risk areas of ischemia. She has a well-preserved ejection fraction  . HYSTEROSCOPY WITH D & C N/A 05/14/2014   Procedure: DILATATION AND CURETTAGE (no specimen); HYSTEROSCOPY;  Surgeon: Jonnie Kind, MD;  Location: [facility];  Service: Gynecology;  Laterality: N/A;  . I & D EXTREMITY Left 06/21/2017   Procedure: IRRIGATION AND DEBRIDEMENT OF LEFT LEG AMPUTATION SITE;  Surgeon: Wallace Going, DO;  Location: [facility];  Service: Plastics;  Laterality: Left;  . INCISION AND DRAINAGE OF  WOUND  04/17/2012   Procedure: IRRIGATION AND DEBRIDEMENT WOUND;  Surgeon: Theodoro Kos, DO;  Location: [place];  Service: Plastics;  Laterality: N/A;  OF ABDOMINAL WOUND, SURGICAL PREP AND PLACEMENT OF VAC, REMOVAL FOREHEAD SKIN LESION  . INCISION AND DRAINAGE OF WOUND N/A 04/24/2012   Procedure: IRRIGATION AND DEBRIDEMENT WOUND;  Surgeon: Theodoro Kos, DO;  Location: [place];  Service: Plastics;  Laterality: N/A;  I & D ABDOMINAL WOUND WITH VAC AND ACELL  . INCISION AND DRAINAGE OF WOUND N/A 05/01/2012   Procedure: IRRIGATION AND DEBRIDEMENT WOUND;  Surgeon: Theodoro Kos, DO;  Location: [place];  Service: Plastics;  Laterality: N/A;  WITH SURGICAL PREP AND PLACEMENT OF VAC  . INCISION AND DRAINAGE OF WOUND N/A 05/08/2012   Procedure: IRRIGATION AND DEBRIDEMENT OF ABD WOUND SURGICAL PREP AND PLACEMENT OF  VAC ;  Surgeon: Theodoro Kos, DO;  Location: [place];  Service: Plastics;  Laterality: N/A;  IRRIGATION AND DEBRIDEMENT OF ABD WOUND SURGICAL PREP AND PLACEMENT OF VAC   . INCISION AND DRAINAGE OF WOUND N/A 05/15/2012   Procedure: IRRIGATION AND DEBRIDEMENT OF ABDOMINAL WOUND WITH POSSIBLE SURGICAL PREP AND PLACEMENT OF VAC;  Surgeon: Theodoro Kos, DO;  Location: [place];  Service: Plastics;  Laterality: N/A;  . INCISION AND DRAINAGE OF WOUND N/A 05/22/2012   Procedure: IRRIGATION AND DEBRIDEMENT OF ABODOMINAL WOUND WITH  SURGICAL PREP AND VAC PLACEMENT;  Surgeon: Theodoro Kos, DO;  Location: [place];  Service: Plastics;  Laterality: N/A;  . INCISION AND DRAINAGE OF WOUND N/A 06/28/2012   Procedure: IRRIGATION AND DEBRIDEMENT OF ABDOMINAL ULCER SURGICAL PREP AND PLACEMENT OF ACELL AND VAC;  Surgeon: Theodoro Kos, DO;  Location: [place];  Service: Plastics;  Laterality: N/A;  . INCISIONAL HERNIA REPAIR  02/07/2012   Procedure: HERNIA REPAIR INCISIONAL;  Surgeon: Gayland Curry, MD,FACS;  Location: [place];  Service: General;;  Open, Primary repair, strangulated Incisional hernia.  Marland Kitchen LESION REMOVAL  04/17/2012   Procedure: LESION REMOVAL;  Surgeon: Theodoro Kos, DO;  Location: [place];  Service: Plastics;  Laterality: N/A;   CENTER OF FOREHEAD, REMOVAL FORHEAD SKIN LESION  . ORIF TOE FRACTURE Left 02/09/2017   Procedure: EXCISION TALAR HEAD, INTERNAL FIXATION MEDIAL COLUMN LEFT FOOT;  Surgeon: Newt Minion, MD;  Location: [place];  Service: Orthopedics;  Laterality: Left;  . POLYPECTOMY N/A 05/14/2014   Procedure: ENDOMETRIAL POLYPECTOMY;  Surgeon: Jonnie Kind, MD;  Location: [facility];  Service: Gynecology;  Laterality: N/A;  . STUMP REVISION Left 08/31/2017   Procedure: REVISION LEFT BELOW KNEE AMPUTATION;  Surgeon: Newt Minion, MD;  Location: [place];  Service: Orthopedics;  Laterality: Left;     Current  Outpatient Medications  Medication Sig Dispense Refill  . apixaban (ELIQUIS) 5 MG TABS tablet Take 1 tablet (5 mg total) by mouth 2 (two) times daily. 60 tablet 6  . fenofibrate 160 MG tablet TAKE 1 TABLET BY MOUTH  DAILY FOR CHOLESTEROL AND  TRIGLYCERIDES 90 tablet 1  . nitroGLYCERIN (NITROSTAT) 0.4 MG SL tablet Place 1 tablet (0.4 mg total) under the tongue every 5 (five) minutes x 3 doses as needed for chest pain. If pain persist after call 911 25 tablet 6  . sertraline (ZOLOFT) 50 MG tablet TAKE 1 TABLET BY MOUTH  DAILY 90 tablet 3  . traZODone (DESYREL) 150 MG tablet TAKE 1 OR 2 TABLETS BY  MOUTH AT BEDTIME  AS NEEDED  FOR SLEEP 180 tablet 3  . valsartan-hydrochlorothiazide (DIOVAN-HCT) 160-12.5 MG tablet TAKE 1 TABLET BY MOUTH  DAILY 90 tablet 1   No current facility-administered medications for this visit.    Allergies:   Ace inhibitors    ROS:  Please see the history of present illness.   Otherwise, review of systems are positive for none.   All other systems are reviewed and negative.    PHYSICAL EXAM: VS:  BP 132/72   Pulse 97   Ht 5\' 2"  (1.575 m)   Wt 279 lb (126.6 kg)   BMI 51.03 kg/m  , BMI Body mass index is 51.03 kg/m. GEN:  No distress NECK:  No jugular venous distention at 90 degrees, waveform within normal limits, carotid upstroke brisk and symmetric, no bruits, no thyromegaly LYMPHATICS:  No cervical adenopathy LUNGS:  Clear to auscultation bilaterally BACK:  No CVA tenderness CHEST:  Unremarkable HEART:  S1 and S2 within normal limits, no S3,  no clicks, no rubs, no murmurs, irregular ABD:  Positive bowel sounds normal in frequency in pitch, no bruits, no rebound, no guarding, unable to assess midline mass or bruit with the patient seated. EXT:  2 plus pulses throughout, noedema, no cyanosis no clubbing, amputation, right leg lymphedema, left lower extremity amputation SKIN:  No rashes no nodules NEURO:  Cranial nerves II through XII grossly intact, motor  grossly intact throughout PSYCH:  Cognitively intact, oriented to person place and time  EKG:  EKG is  ordered today. Atrial fibrillation, rate 97, left axis deviation, poor anterior R wave progression, premature ectopic complexes, nonspecific diffuse T wave flattening.  Recent Labs: 02/19/2020: ALT 11; BUN 18; Creatinine, Ser 1.14; Hemoglobin 15.6; Platelets 253; Potassium 4.6; Sodium 140    Lipid Panel    Component Value Date/Time   CHOL 156 02/19/2020 0858   TRIG 119 02/19/2020 0858   TRIG 247 (H) 09/12/2013 1137   HDL 42 02/19/2020 0858   HDL 35 (L) 09/12/2013 1137   CHOLHDL 3.7 02/19/2020 0858   LDLCALC 93 02/19/2020 0858   LDLCALC 115 (H) 09/12/2013 1137      Wt Readings from Last 3 Encounters:  05/27/20 279 lb (126.6 kg)  05/27/20 279 lb (126.6 kg)  02/19/20 282 lb 6 oz (128.1 kg)      Other studies Reviewed: Additional studies/ records that were reviewed today include: Labs Review of the above records demonstrates: See below   ASSESSMENT AND PLAN:  CAD:  The patient has no new sypmtoms.  No further cardiovascular testing is indicated.  We will continue with aggressive risk reduction and meds as listed.e with aggressive risk reduction and meds as listed.  ATRIAL FIB:   Ms. EMEREE MAHLER has a CHA2DS2 - VASc score of 4.  This is new to her.  I am going to have her wear a 3-day monitor to make sure this is persistent and to make sure she has good rate control.  I am going to stop the aspirin and start Eliquis 5 mg twice daily.  I will check an echocardiogram.  I will then see her back in 1 month.  We briefly talked about cardioversion but it probably would be no significant damage to this and she would likely forego favor of rate control and anticoagulation.  Of note she has no contraindication anticoagulation for history.  She had blood work drawn pending today to include thyroid and I will follow up.  HTN:  The blood pressure is at  target.  No change in therapy.     DYSLIPIDEMIA:  Lipids are slightly elevated most recently with an LDL of 93 and an HDL of 42.  I will address this with her at the next appointment.  She had previously had an LDL of 69.   Current medicines are reviewed at length with the patient today.  The patient does not have concerns regarding medicines.  The following changes have been made:  As above  Labs/ tests ordered today include:   Orders Placed This Encounter  Procedures  . LONG TERM MONITOR (3-14 DAYS)  . EKG 12-Lead  . ECHOCARDIOGRAM COMPLETE     Disposition:   FU with me in 1 month   Signed, [name], MD  05/27/2020 5:36 PM    [facility]

## 2020-05-27 NOTE — Telephone Encounter (Signed)
Patient c/o Palpitations:  High priority if patient c/o lightheadedness, shortness of breath, or chest pain  1) How long have you had palpitations/irregular HR/ Afib? Are you having the symptoms now? Not sure, did not mention any symtpoms  2) Are you currently experiencing lightheadedness, SOB or CP? Did not mention symtpoms  3) Do you have a history of afib (atrial fibrillation) or irregular heart rhythm? First time seeing patient  4) Have you checked your BP or HR? (document readings if available):   5) Are you experiencing any other symptoms? Did not mention any symptoms  Cara from [facility] states they patient had an ekg at their office that showed she was in afib. She states the patient did not mention any symptoms, but did mark chest pain on her questionnaire. She states she did not seem to be in any distress. They are requesting she be seen by the office or the afib clinic today.

## 2020-05-27 NOTE — Telephone Encounter (Signed)
Pt has an appt with Dr. Percival Spanish this afternoon.

## 2020-05-28 LAB — COMPREHENSIVE METABOLIC PANEL
ALT: 9 IU/L (ref 0–32)
AST: 20 IU/L (ref 0–40)
Albumin/Globulin Ratio: 1.6 (ref 1.2–2.2)
Albumin: 4.5 g/dL (ref 3.8–4.8)
Alkaline Phosphatase: 42 IU/L — ABNORMAL LOW (ref 44–121)
BUN/Creatinine Ratio: 18 (ref 12–28)
BUN: 19 mg/dL (ref 8–27)
Bilirubin Total: 0.5 mg/dL (ref 0.0–1.2)
CO2: 23 mmol/L (ref 20–29)
Calcium: 9.6 mg/dL (ref 8.7–10.3)
Chloride: 98 mmol/L (ref 96–106)
Creatinine, Ser: 1.03 mg/dL — ABNORMAL HIGH (ref 0.57–1.00)
Globulin, Total: 2.8 g/dL (ref 1.5–4.5)
Glucose: 85 mg/dL (ref 65–99)
Potassium: 4.3 mmol/L (ref 3.5–5.2)
Sodium: 141 mmol/L (ref 134–144)
Total Protein: 7.3 g/dL (ref 6.0–8.5)
eGFR: 60 mL/min/{1.73_m2} (ref >59)

## 2020-05-28 LAB — CBC WITH DIFFERENTIAL/PLATELET
Basophils Absolute: 0.1 10*3/uL (ref 0.0–0.2)
Basos: 0 %
EOS (ABSOLUTE): 0.2 10*3/uL (ref 0.0–0.4)
Eos: 2 %
Hematocrit: 48.1 % — ABNORMAL HIGH (ref 34.0–46.6)
Hemoglobin: 15.9 g/dL (ref 11.1–15.9)
Immature Grans (Abs): 0 10*3/uL (ref 0.0–0.1)
Immature Granulocytes: 0 %
Lymphocytes Absolute: 4.6 10*3/uL — ABNORMAL HIGH (ref 0.7–3.1)
Lymphs: 39 %
MCH: 29.4 pg (ref 26.6–33.0)
MCHC: 33.1 g/dL (ref 31.5–35.7)
MCV: 89 fL (ref 79–97)
Monocytes Absolute: 0.9 10*3/uL (ref 0.1–0.9)
Monocytes: 8 %
Neutrophils Absolute: 6 10*3/uL (ref 1.4–7.0)
Neutrophils: 51 %
Platelets: 287 10*3/uL (ref 150–450)
RBC: 5.4 x10E6/uL — ABNORMAL HIGH (ref 3.77–5.28)
RDW: 14 % (ref 11.7–15.4)
WBC: 11.8 10*3/uL — ABNORMAL HIGH (ref 3.4–10.8)

## 2020-05-28 LAB — LIPID PANEL WITH LDL/HDL RATIO
Cholesterol, Total: 146 mg/dL (ref 100–199)
HDL: 40 mg/dL (ref >39)
LDL Chol Calc (NIH): 85 mg/dL (ref 0–99)
LDL/HDL Ratio: 2.1 ratio (ref 0.0–3.2)
Triglycerides: 118 mg/dL (ref 0–149)
VLDL Cholesterol Cal: 21 mg/dL (ref 5–40)

## 2020-05-28 LAB — TSH: TSH: 0.499 u[IU]/mL (ref 0.450–4.500)

## 2020-05-28 LAB — HEMOGLOBIN A1C
Est. average glucose Bld gHb Est-mCnc: 117 mg/dL
Hgb A1c MFr Bld: 5.7 % — ABNORMAL HIGH (ref 4.8–5.6)

## 2020-05-28 LAB — T4: T4, Total: 9.6 ug/dL (ref 4.5–12.0)

## 2020-05-28 LAB — VITAMIN D 25 HYDROXY (VIT D DEFICIENCY, FRACTURES): Vit D, 25-Hydroxy: 15.7 ng/mL — ABNORMAL LOW (ref 30.0–100.0)

## 2020-05-28 LAB — T3: T3, Total: 135 ng/dL (ref 71–180)

## 2020-05-28 LAB — INSULIN, RANDOM: INSULIN: 12.4 u[IU]/mL (ref 2.6–24.9)

## 2020-05-29 DIAGNOSIS — I4891 Unspecified atrial fibrillation: Secondary | ICD-10-CM | POA: Diagnosis not present

## 2020-05-29 NOTE — Progress Notes (Signed)
Chief Complaint:   OBESITY Julia West (MR# [id number]) is a 65 y.o. female who presents for evaluation and treatment of obesity and related comorbidities. Current BMI is Body mass index is 51.03 kg/m. Julia West has been struggling with her weight for many years and has been unsuccessful in either losing weight, maintaining weight loss, or reaching her healthy weight goal.  Julia West is currently in the action stage of change and ready to dedicate time achieving and maintaining a healthier weight. Julia West is interested in becoming our patient and working on intensive lifestyle modifications including (but not limited to) diet and exercise for weight loss.  Julia West's husband for the clinic online. Coffee with 2 splenda + half & half (2 cups)- Breakfast- sausage muffin + banana (feel satisfied); 11:30 AM-12 PM sandwich ham (2 slices) (1 slice cheese) with fruit (grapes ~ 1 cup) with water (sprite zero or water) (satisfied); 2 PM- wheat thins (~6) with water (satisfied); Dinner- Lawyer (2) + sweet potato fries (~15 fries), 8 oz canned green beans (feel full); previously after dinner- small individual apple pie.  Julia West's habits were reviewed today and are as follows: Her family eats meals together, she thinks her family will eat healthier with her, her desired weight loss is 79 lbs, she has been heavy most of her life, she started gaining weight once she started having kids, her heaviest weight ever was 342 pounds, she has significant food cravings issues, she snacks frequently in the evenings, she is frequently drinking liquids with calories, she frequently eats larger portions than normal and she struggles with emotional eating.  Depression Screen Julia West's Food and Mood (modified PHQ-9) score was 8.  Depression screen PHQ 2/9 05/27/2020  Decreased Interest 1  Down, Depressed, Hopeless 1  PHQ - 2 Score 2  Altered sleeping 2  Tired, decreased energy 1  Change in appetite 1   Feeling bad or failure about yourself  1  Trouble concentrating 1  Moving slowly or fidgety/restless 0  Suicidal thoughts 0  PHQ-9 Score 8  Difficult doing work/chores Somewhat difficult  Some recent data might be hidden   Subjective:   1. Other fatigue Julia West admits to daytime somnolence and denies waking up still tired. Patent has a history of symptoms of daytime fatigue. Julia West generally gets 6-8 hours of sleep per night, and states that she has generally restful sleep. Snoring is not present. Apneic episodes are not present. Epworth Sleepiness Score is 6.  2. SOB (shortness of breath) on exertion Julia West notes increasing shortness of breath with exercising and seems to be worsening over time with weight gain. She notes getting out of breath sooner with activity than she used to. This has gotten worse recently. Julia West denies shortness of breath at rest or orthopnea.  3. Atrial fibrillation, unspecified type (Julia West) New on EKG. Pt sees Dr. Percival Spanish at [facility]. She has no symptoms. Her vital signs are within normal limits. She is on aspirin. CHADS Vasc score of 4.   4. Other hyperlipidemia Pt is on fenofibrate. She is not on statin therapy. Her last LDL 93, HDL 42, and triglycerides 119.   5. Essential hypertension Pt's BP is controlled today.  She denies chest pain, chest pressure and headache.  6. NAFLD (nonalcoholic fatty liver disease) Pt was diagnosed with ultrasound. Her last CMP resulted transaminases within normal limits. Her Hep C is negative.  7. Hyperglycemia Pt has an elevated blood sugar previously. Her A1c is controlled.   8. Vitamin  D deficiency Pt is not on supplement. Diagnosis likely given obesity.  Assessment/Plan:   1. Other fatigue Julia West does feel that her weight is causing her energy to be lower than it should be. Fatigue may be related to obesity, depression or many other causes. Labs will be ordered, and in the meanwhile, Julia West will focus on  self care including making healthy food choices, increasing physical activity and focusing on stress reduction. Check labs today.  - EKG 12-Lead - CBC with Differential/Platelet - TSH - T4 - T3  2. SOB (shortness of breath) on exertion Julia West does feel that she gets out of breath more easily that she used to when she exercises. Julia West's shortness of breath appears to be obesity related and exercise induced. She has agreed to work on weight loss and gradually increase exercise to treat her exercise induced shortness of breath. Will continue to monitor closely.  3. Atrial fibrillation, unspecified type Julia West) Discussed follow up with cardiology- f/u at 4 PM today with Dr. Percival Spanish at [facility] office. Refill for Eliquis sent in- defer treatment to cardiology.  4. Other hyperlipidemia Cardiovascular risk and specific lipid/LDL goals reviewed.  We discussed several lifestyle modifications today and Julia West will continue to work on diet, exercise and weight loss efforts. Orders and follow up as documented in patient record. Check labs today.  Counseling Intensive lifestyle modifications are the first line treatment for this issue. . Dietary changes: Increase soluble fiber. Decrease simple carbohydrates. . Exercise changes: Moderate to vigorous-intensity aerobic activity 150 minutes per week if tolerated. . Lipid-lowering medications: see documented in medical record.  - Lipid Panel With LDL/HDL Ratio  5. Essential hypertension Julia West is working on healthy weight loss and exercise to improve blood pressure control. We will watch for signs of hypotension as she continues her lifestyle modifications. Check labs today.  - Comprehensive metabolic panel  6. NAFLD (nonalcoholic fatty liver disease) We discussed the likely diagnosis of non-alcoholic fatty liver disease today and how this condition is obesity related. Julia West was educated the importance of weight loss. Julia West agreed to continue  with her weight loss efforts with healthier diet and exercise as an essential part of her treatment plan. Check labs today.  7. Hyperglycemia Fasting labs will be obtained and results with be discussed with Anne Ng in 2 weeks at her follow up visit. In the meanwhile Tashya was started on a lower simple carbohydrate diet and will work on weight loss efforts. Check labs today.  - Hemoglobin A1c - Insulin, random  8. Vitamin D deficiency Low Vitamin D level contributes to fatigue and are associated with obesity, breast, and colon cancer. She agrees to follow-up for routine testing of Vitamin D, at least 2-3 times per year to avoid over-replacement. Check labs today.  - VITAMIN D 25 Hydroxy (Vit-D Deficiency, Fractures)  9. Depression screening Leona had a positive depression screening. Depression is commonly associated with obesity and often results in emotional eating behaviors. We will monitor this closely and work on CBT to help improve the non-hunger eating patterns. Referral to Psychology may be required if no improvement is seen as she continues in our clinic.  10. Class 3 severe obesity with serious comorbidity and body mass index (BMI) of 50.0 to 59.9 in adult, unspecified obesity type (HCC) Niti is currently in the action stage of change and her goal is to continue with weight loss efforts. I recommend Richell begin the structured treatment plan as follows:  She has agreed to the Category 4 Plan.  Exercise goals: No exercise has been prescribed at this time.   Behavioral modification strategies: increasing lean protein intake, meal planning and cooking strategies, keeping healthy foods in the home and planning for success.  She was informed of the importance of frequent follow-up visits to maximize her success with intensive lifestyle modifications for her multiple health conditions. She was informed we would discuss her lab results at her next visit unless there is a critical  issue that needs to be addressed sooner. Taffy agreed to keep her next visit at the agreed upon time to discuss these results.  Objective:   Blood pressure 128/80, pulse 97, temperature (!) 97.5 F (36.4 C), temperature source Oral, height 5\' 2"  (1.575 m), weight 279 lb (126.6 kg), SpO2 95 %. Body mass index is 51.03 kg/m.  EKG: Atrial Fibrillation (new onset)  Indirect Calorimeter completed today shows a VO2 of 708 and a REE of 4929.  General: Cooperative, alert, well developed, in no acute distress. HEENT: Conjunctivae and lids unremarkable. Cardiovascular: Irregular HR.  Lungs: Normal work of breathing. Neurologic: No focal deficits.   Lab Results  Component Value Date   CREATININE 1.03 (H) 05/27/2020   BUN 19 05/27/2020   NA 141 05/27/2020   K 4.3 05/27/2020   CL 98 05/27/2020   CO2 23 05/27/2020   Lab Results  Component Value Date   ALT 9 05/27/2020   AST 20 05/27/2020   ALKPHOS 42 (L) 05/27/2020   BILITOT 0.5 05/27/2020   Lab Results  Component Value Date   HGBA1C 5.7 (H) 05/27/2020   HGBA1C 5.3 02/19/2020   HGBA1C 5.6 08/16/2019   HGBA1C 5.3 08/24/2017   HGBA1C 5.4 05/09/2017   Lab Results  Component Value Date   INSULIN 12.4 05/27/2020   Lab Results  Component Value Date   TSH 0.499 05/27/2020   Lab Results  Component Value Date   CHOL 146 05/27/2020   HDL 40 05/27/2020   LDLCALC 85 05/27/2020   TRIG 118 05/27/2020   CHOLHDL 3.7 02/19/2020   Lab Results  Component Value Date   WBC 11.8 (H) 05/27/2020   HGB 15.9 05/27/2020   HCT 48.1 (H) 05/27/2020   MCV 89 05/27/2020   PLT 287 05/27/2020    Attestation Statements:   Reviewed by clinician on day of visit: allergies, medications, problem list, medical history, surgical history, family history, social history, and previous encounter notes.  Time spent on visit including pre-visit chart review and post-visit charting and care was 65 minutes.   This is the patient's first visit at [facility]. The patient's NEW PATIENT PACKET was reviewed at length. Included in the packet: current and past health history, medications, allergies, ROS, gynecologic history (women only), surgical history, family history, social history, weight history, weight loss surgery history (for those that have had weight loss surgery), nutritional evaluation, mood and food questionnaire, PHQ9, Epworth questionnaire, sleep habits questionnaire, patient life and health improvement goals questionnaire. These will all be scanned into the patient's chart under media.   During the visit, I independently reviewed the patient's EKG, bioimpedance scale results, and indirect calorimeter results. I used this information to tailor a meal plan for the patient that will help her to lose weight and will improve her obesity-related conditions going forward. I performed a medically necessary appropriate examination and/or evaluation. I discussed the assessment and treatment plan with the patient. The patient was provided an opportunity to ask questions and all were answered. The patient agreed with the  plan and demonstrated an understanding of the instructions. Labs were ordered at this visit and will be reviewed at the next visit unless more critical results need to be addressed immediately. Clinical information was updated and documented in the EMR.   Time spent on visit including pre-visit chart review and post-visit care was 65 minutes.   A separate 15 minutes was spent on risk counseling (see above).    Coral Ceo, am acting as transcriptionist for Coralie Common, MD.   I have reviewed the above documentation for accuracy and completeness, and I agree with the above. - Jinny Blossom, MD

## 2020-06-05 ENCOUNTER — Telehealth: Payer: Self-pay

## 2020-06-05 ENCOUNTER — Telehealth: Payer: Self-pay | Admitting: Cardiology

## 2020-06-05 DIAGNOSIS — I4891 Unspecified atrial fibrillation: Secondary | ICD-10-CM | POA: Diagnosis not present

## 2020-06-05 NOTE — Telephone Encounter (Signed)
Spoke with pt on the phone after receiving result from [facility] stating that pt is in a-fib 100% of the time, but went in to a-fib with RVR for 1 min with max heartrate of 189bpm. Pt states that she has been asymptomatic to a-fib completely, with no feelings of rapid heart rate. Pt did state that she notices getting of out breath at times. Insured that pt is on eliquis and has not missed any doses. Pt verbalizes understanding of importance of taking eliquis as prescribed.

## 2020-06-05 NOTE — Telephone Encounter (Signed)
Noted. Thanks.

## 2020-06-05 NOTE — Telephone Encounter (Signed)
Joselyn with [employer] is calling to report abnormal Zio Monitor results.

## 2020-06-10 ENCOUNTER — Other Ambulatory Visit: Payer: Self-pay

## 2020-06-10 ENCOUNTER — Ambulatory Visit (INDEPENDENT_AMBULATORY_CARE_PROVIDER_SITE_OTHER): Payer: Medicare Other | Admitting: Family Medicine

## 2020-06-10 ENCOUNTER — Encounter (INDEPENDENT_AMBULATORY_CARE_PROVIDER_SITE_OTHER): Payer: Self-pay | Admitting: Family Medicine

## 2020-06-10 VITALS — BP 138/89 | HR 98 | Temp 97.9°F | Ht 62.0 in | Wt 279.0 lb

## 2020-06-10 DIAGNOSIS — R7303 Prediabetes: Secondary | ICD-10-CM | POA: Diagnosis not present

## 2020-06-10 DIAGNOSIS — E559 Vitamin D deficiency, unspecified: Secondary | ICD-10-CM

## 2020-06-10 DIAGNOSIS — I4891 Unspecified atrial fibrillation: Secondary | ICD-10-CM

## 2020-06-10 DIAGNOSIS — K760 Fatty (change of) liver, not elsewhere classified: Secondary | ICD-10-CM | POA: Diagnosis not present

## 2020-06-10 DIAGNOSIS — D751 Secondary polycythemia: Secondary | ICD-10-CM

## 2020-06-10 DIAGNOSIS — Z6841 Body Mass Index (BMI) 40.0 and over, adult: Secondary | ICD-10-CM | POA: Diagnosis not present

## 2020-06-10 MED ORDER — VITAMIN D (ERGOCALCIFEROL) 1.25 MG (50000 UNIT) PO CAPS: 50000.0000 [IU] | ORAL_CAPSULE | ORAL | 0 refills | Status: DC

## 2020-06-12 ENCOUNTER — Other Ambulatory Visit: Payer: Self-pay | Admitting: Family Medicine

## 2020-06-12 ENCOUNTER — Other Ambulatory Visit (HOSPITAL_COMMUNITY): Payer: Medicare Other

## 2020-06-17 NOTE — Progress Notes (Signed)
Chief Complaint:   OBESITY Julia West is here to discuss her progress with her obesity treatment plan along with follow-up of her obesity related diagnoses. Julia West is on the Category 4 Plan and states she is following her eating plan approximately 85-89% of the time. Julia West states she is not currently exercising.  Today's visit was #: 2 Starting weight: 279 lbs Starting date: 05/27/2020 Today's weight: 279 lbs Today's date: 06/10/2020 Total lbs lost to date: 0 Total lbs lost since last in-office visit: 0  Interim History: Julia West has adhered to plan 85% of the time. Gives enough options for food. She was eating crackers for snack calories, 2 times she ate apple pie. She has her husband cooking with Pam now. She is not able to get 4 oz of protein at lunch. She is getting 2-3 oz at dinner. She is wondering about protein drinks.  Subjective:   1. Atrial fibrillation, unspecified type (Julia West) Julia West's 3 day Holter shows A. Fib 100% of the time. She is still occasionally experiencing SOB. She has an Echo scheduled for next week.   2. Vitamin D deficiency New. Discussed labs with patient today. Julia West is not on Vit D supplementation. She reports fatigue.  3. Pre-diabetes New. Discussed labs with patient today. Julia West's A1c is 5.7 and insulin level 12.4.   4. Polycythemia Julia West's WBC, RBC, and HCT are elevated but were within normal limits in December 2021. Likely hemoconcentration.   5. NAFLD (nonalcoholic fatty liver disease) Discussed labs with patient today. Julia West's LFT's are within normal limits. This is a historical diagnosis.  Assessment/Plan:   1. Atrial fibrillation, unspecified type (Julia West) Follow up with Dr. Percival Spanish next week.  2. Vitamin D deficiency Low Vitamin D level contributes to fatigue and are associated with obesity, breast, and colon cancer. She agrees to start to take prescription Vitamin D @50 ,000 IU every week and will follow-up for routine testing of  Vitamin D, at least 2-3 times per year to avoid over-replacement. Repeat labs in 3 months.  - Vitamin D, Ergocalciferol, (DRISDOL) 1.25 MG (50000 UNIT) CAPS capsule; Take 1 capsule (50,000 Units total) by mouth every 7 (seven) days.  Dispense: 4 capsule; Refill: 0  3. Pre-diabetes Julia West will continue to work on weight loss, exercise, and decreasing simple carbohydrates to help decrease the risk of diabetes. Repeat labs in 3 months. No medication at this time.  4. Polycythemia Repeat labs in 3 months.  5. NAFLD (nonalcoholic fatty liver disease) We discussed the likely diagnosis of non-alcoholic fatty liver disease today and how this condition is obesity related. Julia West was educated the importance of weight loss. Julia West agreed to continue with her weight loss efforts with healthier diet and exercise as an essential part of her treatment plan. Follow up labs in 3 months.  6. Class 3 severe obesity with serious comorbidity and body mass index (BMI) of 50.0 to 59.9 in adult, unspecified obesity type (Julia West) Julia West is currently in the action stage of change. As such, her goal is to continue with weight loss efforts. She has agreed to the Category 4 Plan.   Exercise goals: No exercise has been prescribed at this time.  Behavioral modification strategies: increasing lean protein intake, meal planning and cooking strategies and keeping healthy foods in the home.  Julia West has agreed to follow-up with our clinic in 2 weeks. She was informed of the importance of frequent follow-up visits to maximize her success with intensive lifestyle modifications for her multiple health conditions.   Objective:  Blood pressure 138/89, pulse 98, temperature 97.9 F (36.6 C), temperature source Oral, height 5\' 2"  (1.575 m), weight 279 lb (126.6 kg), SpO2 94 %. Body mass index is 51.03 kg/m.  General: Cooperative, alert, well developed, in no acute distress. HEENT: Conjunctivae and lids  unremarkable. Cardiovascular: Regular rhythm.  Lungs: Normal work of breathing. Neurologic: No focal deficits.   Lab Results  Component Value Date   CREATININE 1.03 (H) 05/27/2020   BUN 19 05/27/2020   NA 141 05/27/2020   K 4.3 05/27/2020   CL 98 05/27/2020   CO2 23 05/27/2020   Lab Results  Component Value Date   ALT 9 05/27/2020   AST 20 05/27/2020   ALKPHOS 42 (L) 05/27/2020   BILITOT 0.5 05/27/2020   Lab Results  Component Value Date   HGBA1C 5.7 (H) 05/27/2020   HGBA1C 5.3 02/19/2020   HGBA1C 5.6 08/16/2019   HGBA1C 5.3 08/24/2017   HGBA1C 5.4 05/09/2017   Lab Results  Component Value Date   INSULIN 12.4 05/27/2020   Lab Results  Component Value Date   TSH 0.499 05/27/2020   Lab Results  Component Value Date   CHOL 146 05/27/2020   HDL 40 05/27/2020   LDLCALC 85 05/27/2020   TRIG 118 05/27/2020   CHOLHDL 3.7 02/19/2020   Lab Results  Component Value Date   WBC 11.8 (H) 05/27/2020   HGB 15.9 05/27/2020   HCT 48.1 (H) 05/27/2020   MCV 89 05/27/2020   PLT 287 05/27/2020   No results found for: IRON, TIBC, FERRITIN  Obesity Behavioral Intervention:   Approximately 15 minutes were spent on the discussion below.  ASK: We discussed the diagnosis of obesity with Julia West today and Julia West agreed to give [place] permission to discuss obesity behavioral modification therapy today.  ASSESS: Julia West has the diagnosis of obesity and her BMI today is 51.03. Julia West is in the action stage of change.   ADVISE: Julia West was educated on the multiple health risks of obesity as well as the benefit of weight loss to improve her health. She was advised of the need for long term treatment and the importance of lifestyle modifications to improve her current health and to decrease her risk of future health problems.  AGREE: Multiple dietary modification options and treatment options were discussed and Julia West agreed to follow the recommendations documented in the above  note.  ARRANGE: Julia West was educated on the importance of frequent visits to treat obesity as outlined per CMS and USPSTF guidelines and agreed to schedule her next follow up appointment today.  Attestation Statements:   Reviewed by clinician on day of visit: allergies, medications, problem list, medical history, surgical history, family history, social history, and previous encounter notes.  Coral Ceo, am acting as transcriptionist for Coralie Common, MD.   I have reviewed the above documentation for accuracy and completeness, and I agree with the above. - Jinny Blossom, MD

## 2020-06-18 ENCOUNTER — Other Ambulatory Visit: Payer: Self-pay

## 2020-06-18 ENCOUNTER — Ambulatory Visit (HOSPITAL_COMMUNITY)
Admission: RE | Admit: 2020-06-18 | Discharge: 2020-06-18 | Disposition: A | Discharge status: Home / Self Care | Payer: Medicare Other | Source: Ambulatory Visit | Attending: Cardiology | Admitting: Cardiology

## 2020-06-18 DIAGNOSIS — I361 Nonrheumatic tricuspid (valve) insufficiency: Secondary | ICD-10-CM

## 2020-06-18 DIAGNOSIS — I351 Nonrheumatic aortic (valve) insufficiency: Secondary | ICD-10-CM

## 2020-06-18 DIAGNOSIS — I4891 Unspecified atrial fibrillation: Secondary | ICD-10-CM | POA: Diagnosis not present

## 2020-06-18 DIAGNOSIS — I340 Nonrheumatic mitral (valve) insufficiency: Secondary | ICD-10-CM | POA: Diagnosis not present

## 2020-06-18 LAB — ECHOCARDIOGRAM COMPLETE
AR max vel: 1.45 cm2
AV Area VTI: 1.48 cm2
AV Area mean vel: 1.43 cm2
AV Mean grad: 7 mmHg
AV Peak grad: 12.9 mmHg
Ao pk vel: 1.8 m/s
Area-P 1/2: 4.33 cm2
S' Lateral: 3.7 cm

## 2020-06-18 NOTE — Progress Notes (Signed)
*  PRELIMINARY RESULTS* Echocardiogram 2D Echocardiogram has been performed.  Samuel [place] 06/18/2020, 2:16 PM

## 2020-06-26 ENCOUNTER — Encounter (INDEPENDENT_AMBULATORY_CARE_PROVIDER_SITE_OTHER): Payer: Self-pay | Admitting: Family Medicine

## 2020-06-26 ENCOUNTER — Ambulatory Visit (INDEPENDENT_AMBULATORY_CARE_PROVIDER_SITE_OTHER): Payer: Medicare Other | Admitting: Family Medicine

## 2020-06-26 ENCOUNTER — Other Ambulatory Visit: Payer: Self-pay

## 2020-06-26 VITALS — BP 134/91 | HR 50 | Temp 97.5°F | Ht 62.0 in | Wt 278.0 lb

## 2020-06-26 DIAGNOSIS — E559 Vitamin D deficiency, unspecified: Secondary | ICD-10-CM

## 2020-06-26 DIAGNOSIS — R7303 Prediabetes: Secondary | ICD-10-CM

## 2020-06-26 DIAGNOSIS — Z6841 Body Mass Index (BMI) 40.0 and over, adult: Secondary | ICD-10-CM

## 2020-06-26 MED ORDER — VITAMIN D (ERGOCALCIFEROL) 1.25 MG (50000 UNIT) PO CAPS: 50000.0000 [IU] | ORAL_CAPSULE | ORAL | 0 refills | Status: DC

## 2020-06-30 ENCOUNTER — Encounter (INDEPENDENT_AMBULATORY_CARE_PROVIDER_SITE_OTHER): Payer: Self-pay

## 2020-07-01 ENCOUNTER — Encounter (INDEPENDENT_AMBULATORY_CARE_PROVIDER_SITE_OTHER): Payer: Self-pay | Admitting: Family Medicine

## 2020-07-01 DIAGNOSIS — E559 Vitamin D deficiency, unspecified: Secondary | ICD-10-CM | POA: Insufficient documentation

## 2020-07-01 DIAGNOSIS — R7303 Prediabetes: Secondary | ICD-10-CM | POA: Insufficient documentation

## 2020-07-01 DIAGNOSIS — Z6841 Body Mass Index (BMI) 40.0 and over, adult: Secondary | ICD-10-CM | POA: Insufficient documentation

## 2020-07-01 NOTE — Progress Notes (Signed)
Chief Complaint:   OBESITY Julia West is here to discuss her progress with her obesity treatment plan along with follow-up of her obesity related diagnoses. Julia West is on the Category 4 Plan and states she is following her eating plan approximately 90% of the time. Julia West states she is doing  0 minutes 0 times per week.  Today's visit was #: 3 Starting weight: 279 lbs Starting date: 05/27/2020 Today's weight: 278 lbs Today's date: 06/26/2020 Total lbs lost to date: 1 lb Total lbs lost since last in-office visit: 1 lb  Interim History: Julia West is unsure why she is not losing weight. She is on category 4.  She has been adhering to plan extremely well. She has a left BKA and uses crutches.  Subjective:   1. Prediabetes Julia West's last A1C was 5.7. She is not on Metfomin. She has the diagnosis of Diabetes in her chart but notes this is a mistake. She says herr A1c has never been > 6.4.  Her PCP agrees with this.  Lab Results  Component Value Date   HGBA1C 5.7 (H) 05/27/2020   Lab Results  Component Value Date   INSULIN 12.4 05/27/2020   2. Vitamin D deficiency Julia West's Vitamin D was very low at 15.7. Currently on weekly Vitamin D prescription.  Assessment/Plan:   1. Prediabetes Continue meal plan.  2. Vitamin D deficiency We will refill Vitamin D 50,000 IU for 1 month.  - Vitamin D, Ergocalciferol, (DRISDOL) 1.25 MG (50000 UNIT) CAPS capsule; Take 1 capsule (50,000 Units total) by mouth every 7 (seven) days.  Dispense: 4 capsule; Refill: 0  3. Obesity, cuttent BMI 50.85 IC was perhaps inaccurate. Reduce plan to category 3. Julia West was given handout for protein equivalents and category 3 meal plan.   Julia West is currently in the action stage of change. As such, her goal is to continue with weight loss efforts. She has agreed to the Category 3 Plan.   Exercise goals: No exercise has been prescribed at this time.  Behavioral modification strategies: meal planning and  cooking strategies.  Julia West has agreed to follow-up with our clinic in 3 weeks.  Objective:   Blood pressure (!) 134/91, pulse (!) 50, temperature (!) 97.5 F (36.4 C), height 5\' 2"  (1.575 m), weight 278 lb (126.1 kg), SpO2 94 %. Body mass index is 50.85 kg/m.  General: Cooperative, alert, well developed, in no acute distress. HEENT: Conjunctivae and lids unremarkable. Cardiovascular: Regular rhythm.  Lungs: Normal work of breathing. Neurologic: No focal deficits.   Lab Results  Component Value Date   CREATININE 1.03 (H) 05/27/2020   BUN 19 05/27/2020   NA 141 05/27/2020   K 4.3 05/27/2020   CL 98 05/27/2020   CO2 23 05/27/2020   Lab Results  Component Value Date   ALT 9 05/27/2020   AST 20 05/27/2020   ALKPHOS 42 (L) 05/27/2020   BILITOT 0.5 05/27/2020   Lab Results  Component Value Date   HGBA1C 5.7 (H) 05/27/2020   HGBA1C 5.3 02/19/2020   HGBA1C 5.6 08/16/2019   HGBA1C 5.3 08/24/2017   HGBA1C 5.4 05/09/2017   Lab Results  Component Value Date   INSULIN 12.4 05/27/2020   Lab Results  Component Value Date   TSH 0.499 05/27/2020   Lab Results  Component Value Date   CHOL 146 05/27/2020   HDL 40 05/27/2020   LDLCALC 85 05/27/2020   TRIG 118 05/27/2020   CHOLHDL 3.7 02/19/2020   Lab Results  Component Value Date  WBC 11.8 (H) 05/27/2020   HGB 15.9 05/27/2020   HCT 48.1 (H) 05/27/2020   MCV 89 05/27/2020   PLT 287 05/27/2020   Obesity Behavioral Intervention:   Approximately 15 minutes were spent on the discussion below.  ASK: We discussed the diagnosis of obesity with Julia West today and Julia West agreed to give [place] permission to discuss obesity behavioral modification therapy today.  ASSESS: Julia West has the diagnosis of obesity and her BMI today is 50.83. Julia West is in the action stage of change.   ADVISE: Julia West was educated on the multiple health risks of obesity as well as the benefit of weight loss to improve her health. She was advised of  the need for long term treatment and the importance of lifestyle modifications to improve her current health and to decrease her risk of future health problems.  AGREE: Multiple dietary modification options and treatment options were discussed and Julia West agreed to follow the recommendations documented in the above note.  ARRANGE: Julia West was educated on the importance of frequent visits to treat obesity as outlined per CMS and USPSTF guidelines and agreed to schedule her next follow up appointment today.  Attestation Statements:   Reviewed by clinician on day of visit: allergies, medications, problem list, medical history, surgical history, family history, social history, and previous encounter notes.  Leodis Binet Friedenbach, CMA, am acting as Location manager for [employer], [place].  I have reviewed the above documentation for accuracy and completeness, and I agree with the above. -  Georgianne Fick, FNP

## 2020-07-08 DIAGNOSIS — I2510 Atherosclerotic heart disease of native coronary artery without angina pectoris: Secondary | ICD-10-CM | POA: Insufficient documentation

## 2020-07-08 DIAGNOSIS — I4819 Other persistent atrial fibrillation: Secondary | ICD-10-CM | POA: Insufficient documentation

## 2020-07-08 DIAGNOSIS — I4891 Unspecified atrial fibrillation: Secondary | ICD-10-CM | POA: Insufficient documentation

## 2020-07-08 NOTE — Progress Notes (Signed)
pal    Cardiology Office Note   Date:  07/09/2020   ID:  Julia West, DOB 1955/09/24, MRN [id number]  PCP:  [name], MD  Cardiologist:   No primary care provider on file.  Chief Complaint  Patient presents with  . Palpitations       History of Present Illness: Julia West is a 65 y.o. female who presented for evaluation of CAD.  At the last visit she was noted to be in atrial fib.   She wore a monitor and was noted to be persistent atrial fib with some tachybradycardia rates   she gets around with crutches on her prosthesis.  She says at night she does feel her heart racing at times.  She does not really feel it racing during the day.  She denies any presyncope or syncope.  She is not having any chest pressure, neck or arm discomfort.  She has been losing weight with diet and she is being very careful about this.  She is not having any new edema.  She tolerated her blood thinner and is not having any evidence of bleeding.    Past Medical History:  Diagnosis Date  . Acute myocardial infarction    with a ruptured plaque in the circumflex in 2003  . Anxiety   . Bilateral swelling of feet   . Cancer ([place])    basal cell face  . Chest pain   . Complication of anesthesia    states low O2 sats post-op 11/13, always slow to awaken  . Degenerative joint disease   . 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 Depression   . Dyspnea    with activity- car to house - steps  . Fatty liver   . Gallbladder problem   . Heart disease   . HTN (hypertension)   . Hyperlipidemia LDL goal <70   . Incarcerated hernia   . Internal and external hemorrhoids without complication   . Joint pain   . Lymphedema    right side of body  . Lymphedema of arm    right  . Morbid obesity ([place])   . Rheumatoid arthritis ([place])   . Surgical wound, non healing ABDOMINAL    has wound vac @ 125 mm Hg  . Ventral hernia     Past Surgical History:  Procedure Laterality Date  .  AMPUTATION Left 05/06/2017   Procedure: BELOW KNEE AMPUTATION;  Surgeon: Newt Minion, MD;  Location: [facility];  Service: Orthopedics;  Laterality: Left;  . APPLICATION OF WOUND VAC    . BOWEL RESECTION  02/07/2012   Procedure: SMALL BOWEL RESECTION;  Surgeon: Gayland Curry, MD,FACS;  Location: [facility];  Service: General;;  . CARDIOVASCULAR STRESS TEST  01-17-2012  DR Lestat Golob   LOW RISK NUCLEAR TEST  . CESAREAN SECTION     x 4 in remote past  . CHOLECYSTECTOMY    . CORONARY ARTERY BYPASS GRAFT  2003   by Dr. Servando Snare. LIMA to the LAD, free RIMA to the circumflex. Stress perfusion study December 2009 with no high-risk areas of ischemia. She has a well-preserved ejection fraction  . HYSTEROSCOPY WITH D & C N/A 05/14/2014   Procedure: DILATATION AND CURETTAGE (no specimen); HYSTEROSCOPY;  Surgeon: Jonnie Kind, MD;  Location: [facility];  Service: Gynecology;  Laterality: N/A;  . I & D EXTREMITY Left 06/21/2017   Procedure: IRRIGATION AND DEBRIDEMENT OF LEFT LEG AMPUTATION SITE;  Surgeon: Audelia Hives  S, DO;  Location: [facility];  Service: Plastics;  Laterality: Left;  . INCISION AND DRAINAGE OF WOUND  04/17/2012   Procedure: IRRIGATION AND DEBRIDEMENT WOUND;  Surgeon: Theodoro Kos, DO;  Location: [facility];  Service: Plastics;  Laterality: N/A;  OF ABDOMINAL WOUND, SURGICAL PREP AND PLACEMENT OF VAC, REMOVAL FOREHEAD SKIN LESION  . INCISION AND DRAINAGE OF WOUND N/A 04/24/2012   Procedure: IRRIGATION AND DEBRIDEMENT WOUND;  Surgeon: Theodoro Kos, DO;  Location: [facility];  Service: Plastics;  Laterality: N/A;  I & D ABDOMINAL WOUND WITH VAC AND ACELL  . INCISION AND DRAINAGE OF WOUND N/A 05/01/2012   Procedure: IRRIGATION AND DEBRIDEMENT WOUND;  Surgeon: Theodoro Kos, DO;  Location: [facility];  Service: Plastics;  Laterality: N/A;  WITH SURGICAL PREP AND PLACEMENT OF VAC  . INCISION AND DRAINAGE OF WOUND N/A 05/08/2012   Procedure: IRRIGATION AND  DEBRIDEMENT OF ABD WOUND SURGICAL PREP AND PLACEMENT OF VAC ;  Surgeon: Theodoro Kos, DO;  Location: [facility];  Service: Plastics;  Laterality: N/A;  IRRIGATION AND DEBRIDEMENT OF ABD WOUND SURGICAL PREP AND PLACEMENT OF VAC   . INCISION AND DRAINAGE OF WOUND N/A 05/15/2012   Procedure: IRRIGATION AND DEBRIDEMENT OF ABDOMINAL WOUND WITH POSSIBLE SURGICAL PREP AND PLACEMENT OF VAC;  Surgeon: Theodoro Kos, DO;  Location: [facility];  Service: Plastics;  Laterality: N/A;  . INCISION AND DRAINAGE OF WOUND N/A 05/22/2012   Procedure: IRRIGATION AND DEBRIDEMENT OF ABODOMINAL WOUND WITH  SURGICAL PREP AND VAC PLACEMENT;  Surgeon: Theodoro Kos, DO;  Location: [facility];  Service: Plastics;  Laterality: N/A;  . INCISION AND DRAINAGE OF WOUND N/A 06/28/2012   Procedure: IRRIGATION AND DEBRIDEMENT OF ABDOMINAL ULCER SURGICAL PREP AND PLACEMENT OF ACELL AND VAC;  Surgeon: Theodoro Kos, DO;  Location: [facility];  Service: Plastics;  Laterality: N/A;  . INCISIONAL HERNIA REPAIR  02/07/2012   Procedure: HERNIA REPAIR INCISIONAL;  Surgeon: Gayland Curry, MD,FACS;  Location: [facility];  Service: General;;  Open, Primary repair, strangulated Incisional hernia.  Marland Kitchen LESION REMOVAL  04/17/2012   Procedure: LESION REMOVAL;  Surgeon: Theodoro Kos, DO;  Location: [facility];  Service: Plastics;  Laterality: N/A;   CENTER OF FOREHEAD, REMOVAL FORHEAD SKIN LESION  . ORIF TOE FRACTURE Left 02/09/2017   Procedure: EXCISION TALAR HEAD, INTERNAL FIXATION MEDIAL COLUMN LEFT FOOT;  Surgeon: Newt Minion, MD;  Location: [facility];  Service: Orthopedics;  Laterality: Left;  . POLYPECTOMY N/A 05/14/2014   Procedure: ENDOMETRIAL POLYPECTOMY;  Surgeon: Jonnie Kind, MD;  Location: [facility];  Service: Gynecology;  Laterality: N/A;  . STUMP REVISION Left 08/31/2017   Procedure: REVISION LEFT BELOW KNEE AMPUTATION;  Surgeon: Newt Minion, MD;  Location: [facility];   Service: Orthopedics;  Laterality: Left;     Current Outpatient Medications  Medication Sig Dispense Refill  . apixaban (ELIQUIS) 5 MG TABS tablet Take 1 tablet (5 mg total) by mouth 2 (two) times daily. 60 tablet 6  . fenofibrate 160 MG tablet TAKE 1 TABLET BY MOUTH  DAILY FOR CHOLESTEROL AND  TRIGLYCERIDES 90 tablet 1  . nitroGLYCERIN (NITROSTAT) 0.4 MG SL tablet Place 1 tablet (0.4 mg total) under the tongue every 5 (five) minutes x 3 doses as needed for chest pain. If pain persist after call 911 25 tablet 6  . sertraline (ZOLOFT) 50 MG tablet TAKE 1 TABLET BY MOUTH  DAILY 90 tablet  0  . traZODone (DESYREL) 150 MG tablet TAKE 1 OR 2 TABLETS BY  MOUTH AT BEDTIME AS NEEDED  FOR SLEEP 180 tablet 3  . valsartan-hydrochlorothiazide (DIOVAN-HCT) 160-12.5 MG tablet TAKE 1 TABLET BY MOUTH  DAILY 90 tablet 1  . Vitamin D, Ergocalciferol, (DRISDOL) 1.25 MG (50000 UNIT) CAPS capsule Take 1 capsule (50,000 Units total) by mouth every 7 (seven) days. 4 capsule 0   No current facility-administered medications for this visit.    Allergies:   Ace inhibitors    ROS:  Please see the history of present illness.   Otherwise, review of systems are positive for none.   All other systems are reviewed and negative.    PHYSICAL EXAM: VS:  BP 118/78   Pulse 68   Ht 5\' 2"  (1.575 m)   Wt 280 lb (127 kg)   BMI 51.21 kg/m  , BMI Body mass index is 51.21 kg/m. GEN:  No distress NECK:  No jugular venous distention at 90 degrees, waveform within normal limits, carotid upstroke brisk and symmetric, no bruits, no thyromegaly LYMPHATICS:  No cervical adenopathy LUNGS:  Clear to auscultation bilaterally BACK:  No CVA tenderness CHEST:  Unremarkable HEART:  S1 and S2 within normal limits, no S3,  clicks, no rubs, no murmurs ABD:  Positive bowel sounds normal in frequency in pitch, no bruits, no rebound, no guarding, unable to assess midline mass or bruit with the patient seated. EXT:  2 plus pulses throughout,  moderate edema, no cyanosis no clubbing, status post left lower extremity amputation SKIN:  No rashes no nodules NEURO:  Cranial nerves II through XII grossly intact, motor grossly intact throughout PSYCH:  Cognitively intact, oriented to person place and time   EKG:  EKG is  ordered today. Atrial fibrillation, rate 78, left axis deviation, poor anterior R wave progression, premature ectopic complexes, nonspecific diffuse T wave flattening.  Recent Labs: 05/27/2020: ALT 9; BUN 19; Creatinine, Ser 1.03; Hemoglobin 15.9; Platelets 287; Potassium 4.3; Sodium 141; TSH 0.499    Lipid Panel    Component Value Date/Time   CHOL 146 05/27/2020 1110   TRIG 118 05/27/2020 1110   TRIG 247 (H) 09/12/2013 1137   HDL 40 05/27/2020 1110   HDL 35 (L) 09/12/2013 1137   CHOLHDL 3.7 02/19/2020 0858   LDLCALC 85 05/27/2020 1110   LDLCALC 115 (H) 09/12/2013 1137      Wt Readings from Last 3 Encounters:  07/09/20 280 lb (127 kg)  06/26/20 278 lb (126.1 kg)  06/10/20 279 lb (126.6 kg)      Other studies Reviewed: Additional studies/ records that were reviewed today include: Zio patch Review of the above records demonstrates: See elsewhere   ASSESSMENT AND PLAN:  CAD: The patient has no new sypmtoms.  No further cardiovascular testing is indicated.  We will continue with aggressive risk reduction and meds as listed.  ATRIAL FIB:   Ms. LYNNEL ZANETTI has a CHA2DS2 - VASc score of 4.     She does have some tachybradycardia rates and she feels some palpitations.  I think it is reasonable to perform a cardioversion and I will do this off of any antiarrhythmics.  She is on anticoagulation.  We discussed the risk benefits and she will agree to proceed electively.  HTN:  The blood pressure is at target.  No change in therapy.    Current medicines are reviewed at length with the patient today.  The patient does not have concerns regarding medicines.  The following changes have been made:   None  Labs/ tests ordered today include:   Orders Placed This Encounter  Procedures  . CBC  . Basic metabolic panel  . EKG 12-Lead     Disposition:   FU with me after the cardioversion.   Signed, [name], MD  07/09/2020 1:11 PM    [place]

## 2020-07-09 ENCOUNTER — Other Ambulatory Visit: Payer: Self-pay

## 2020-07-09 ENCOUNTER — Encounter: Payer: Self-pay | Admitting: Cardiology

## 2020-07-09 ENCOUNTER — Ambulatory Visit: Payer: Medicare Other | Admitting: Cardiology

## 2020-07-09 VITALS — BP 118/78 | HR 68 | Ht 62.0 in | Wt 280.0 lb

## 2020-07-09 DIAGNOSIS — I2510 Atherosclerotic heart disease of native coronary artery without angina pectoris: Secondary | ICD-10-CM

## 2020-07-09 DIAGNOSIS — E785 Hyperlipidemia, unspecified: Secondary | ICD-10-CM

## 2020-07-09 DIAGNOSIS — Z01812 Encounter for preprocedural laboratory examination: Secondary | ICD-10-CM | POA: Diagnosis not present

## 2020-07-09 DIAGNOSIS — I4819 Other persistent atrial fibrillation: Secondary | ICD-10-CM | POA: Diagnosis not present

## 2020-07-09 DIAGNOSIS — I10 Essential (primary) hypertension: Secondary | ICD-10-CM | POA: Diagnosis not present

## 2020-07-09 NOTE — Patient Instructions (Addendum)
Medication Instructions:  The current medical regimen is effective;  continue present plan and medications.  *If you need a refill on your cardiac medications before your next appointment, please call your pharmacy*  Lab Work: Please check in on Friday at 8 am.  Go to the Main Entrance then and report to Short Stay.  You will see the pre-op nurse and have both lab and Covid screen at that time.  Testing/Procedures: You have been scheduled for an out-patient cardioversion at [facility] on  Monday, Jul 14, 2020 at 10 AM with Dr Harl Bowie. Check in at:  9 AM at [facility] (unless otherwise instructed) Nothing to eat/drink after midnight the night before.  You may take your medications with sips of water this AM.  Do not miss any doses of Eliquis between now and your procedure date. Wear clothing easy to get on and off. Someone must be with you to drive you home and stay with you the first 24 hours after your procedure.   Follow-Up: At [facility], you and your health needs are our priority.  As part of our continuing mission to provide you with exceptional heart care, we have created designated Provider Care Teams.  These Care Teams include your primary Cardiologist (physician) and Advanced Practice Providers (APPs -  Physician Assistants and Nurse Practitioners) who all work together to provide you with the care you need, when you need it.  We recommend signing up for the patient portal called "MyChart".  Sign up information is provided on this After Visit Summary.  MyChart is used to connect with patients for Virtual Visits (Telemedicine).  Patients are able to view lab/test results, encounter notes, upcoming appointments, etc.  Non-urgent messages can be sent to your provider as well.   To learn more about what you can do with MyChart, go to [email or web address].    Your next appointment:   1 month(s) after your out patient cardioversion. (June 8 at 11  AM)  The format for your next appointment:   In Person  Provider:   [name], MD   Thank you for choosing [facility]!!

## 2020-07-09 NOTE — Patient Instructions (Signed)
Julia West  07/09/2020     @PREFPERIOPPHARMACY @   Your procedure is scheduled on  07/14/2020.   Report to [facility] at  0830  A.M.   Call this number if you have problems the morning of surgery:  [phone]   Remember:  Do not eat or drink after midnight.                        Take these medicines the morning of surgery with A SIP OF WATER   Zoloft.     Please brush your teeth.  Do not wear jewelry, make-up or nail polish.  Do not wear lotions, powders, or perfumes, or deodorant.  Do not shave 48 hours prior to surgery.  Men may shave face and neck.  Do not bring valuables to the hospital.  [facility] is not responsible for any belongings or valuables.  Contacts, dentures or bridgework may not be worn into surgery.  Leave your suitcase in the car.  After surgery it may be brought to your room.  For patients admitted to the hospital, discharge time will be determined by your treatment team.  Patients discharged the day of surgery will not be allowed to drive home and must have someone with them for 24 hours.   Special instructions:  DO NOT smoke tobacco or vape for 24 hours before your procedure.  Please read over the following fact sheets that you were given. Anesthesia Post-op Instructions and Care and Recovery After Surgery       Electrical Cardioversion Electrical cardioversion is the delivery of a jolt of electricity to restore a normal rhythm to the heart. A rhythm that is too fast or is not regular keeps the heart from pumping well. In this procedure, sticky patches or metal paddles are placed on the chest to deliver electricity to the heart from a device. This procedure may be done in an emergency if:  There is low or no blood pressure as a result of the heart rhythm.  Normal rhythm must be restored as fast as possible to protect the brain and heart from further damage.  It may save a life. This may also be a scheduled procedure for  irregular or fast heart rhythms that are not immediately life-threatening. Tell a health care provider about:  Any allergies you have.  All medicines you are taking, including vitamins, herbs, eye drops, creams, and over-the-counter medicines.  Any problems you or family members have had with anesthetic medicines.  Any blood disorders you have.  Any surgeries you have had.  Any medical conditions you have.  Whether you are pregnant or may be pregnant. What are the risks? Generally, this is a safe procedure. However, problems may occur, including:  Allergic reactions to medicines.  A blood clot that breaks free and travels to other parts of your body.  The possible return of an abnormal heart rhythm within hours or days after the procedure.  Your heart stopping (cardiac arrest). This is rare. What happens before the procedure? Medicines  Your health care provider may have you start taking: ? Blood-thinning medicines (anticoagulants) so your blood does not clot as easily. ? Medicines to help stabilize your heart rate and rhythm.  Ask your health care provider about: ? Changing or stopping your regular medicines. This is especially important if you are taking diabetes medicines or blood thinners. ? Taking medicines such as aspirin and ibuprofen. These medicines can  thin your blood. Do not take these medicines unless your health care provider tells you to take them. ? Taking over-the-counter medicines, vitamins, herbs, and supplements. General instructions  Follow instructions from your health care provider about eating or drinking restrictions.  Plan to have someone take you home from the hospital or clinic.  If you will be going home right after the procedure, plan to have someone with you for 24 hours.  Ask your health care provider what steps will be taken to help prevent infection. These may include washing your skin with a germ-killing soap. What happens during the  procedure?  An IV will be inserted into one of your veins.  Sticky patches (electrodes) or metal paddles may be placed on your chest.  You will be given a medicine to help you relax (sedative).  An electrical shock will be delivered. The procedure may vary among health care providers and hospitals.   What can I expect after the procedure?  Your blood pressure, heart rate, breathing rate, and blood oxygen level will be monitored until you leave the hospital or clinic.  Your heart rhythm will be watched to make sure it does not change.  You may have some redness on the skin where the shocks were given. Follow these instructions at home:  Do not drive for 24 hours if you were given a sedative during your procedure.  Take over-the-counter and prescription medicines only as told by your health care provider.  Ask your health care provider how to check your pulse. Check it often.  Rest for 48 hours after the procedure or as told by your health care provider.  Avoid or limit your caffeine use as told by your health care provider.  Keep all follow-up visits as told by your health care provider. This is important. Contact a health care provider if:  You feel like your heart is beating too quickly or your pulse is not regular.  You have a serious muscle cramp that does not go away. Get help right away if:  You have discomfort in your chest.  You are dizzy or you feel faint.  You have trouble breathing or you are short of breath.  Your speech is slurred.  You have trouble moving an arm or leg on one side of your body.  Your fingers or toes turn cold or blue. Summary  Electrical cardioversion is the delivery of a jolt of electricity to restore a normal rhythm to the heart.  This procedure may be done right away in an emergency or may be a scheduled procedure if the condition is not an emergency.  Generally, this is a safe procedure.  After the procedure, check your pulse  often as told by your health care provider. This information is not intended to replace advice given to you by your health care provider. Make sure you discuss any questions you have with your health care provider. Document Revised: 10/02/2018 Document Reviewed: 10/02/2018 Elsevier Patient Education  2021 [place] After This sheet gives you information about how to care for yourself after your procedure. Your health care provider may also give you more specific instructions. If you have problems or questions, contact your health care provider. What can I expect after the procedure? After the procedure, it is common to have:  Tiredness.  Forgetfulness about what happened after the procedure.  Impaired judgment for important decisions.  Nausea or vomiting.  Some difficulty with balance. Follow these instructions at home:  For the time period you were told by your health care provider:  Rest as needed.  Do not participate in activities where you could fall or become injured.  Do not drive or use machinery.  Do not drink alcohol.  Do not take sleeping pills or medicines that cause drowsiness.  Do not make important decisions or sign legal documents.  Do not take care of children on your own.      Eating and drinking  Follow the diet that is recommended by your health care provider.  Drink enough fluid to keep your urine pale yellow.  If you vomit: ? Drink water, juice, or soup when you can drink without vomiting. ? Make sure you have little or no nausea before eating solid foods. General instructions  Have a responsible adult stay with you for the time you are told. It is important to have someone help care for you until you are awake and alert.  Take over-the-counter and prescription medicines only as told by your health care provider.  If you have sleep apnea, surgery and certain medicines can increase your risk for breathing  problems. Follow instructions from your health care provider about wearing your sleep device: ? Anytime you are sleeping, including during daytime naps. ? While taking prescription pain medicines, sleeping medicines, or medicines that make you drowsy.  Avoid smoking.  Keep all follow-up visits as told by your health care provider. This is important. Contact a health care provider if:  You keep feeling nauseous or you keep vomiting.  You feel light-headed.  You are still sleepy or having trouble with balance after 24 hours.  You develop a rash.  You have a fever.  You have redness or swelling around the IV site. Get help right away if:  You have trouble breathing.  You have new-onset confusion at home. Summary  For several hours after your procedure, you may feel tired. You may also be forgetful and have poor judgment.  Have a responsible adult stay with you for the time you are told. It is important to have someone help care for you until you are awake and alert.  Rest as told. Do not drive or operate machinery. Do not drink alcohol or take sleeping pills.  Get help right away if you have trouble breathing, or if you suddenly become confused. This information is not intended to replace advice given to you by your health care provider. Make sure you discuss any questions you have with your health care provider. Document Revised: 11/15/2019 Document Reviewed: 02/01/2019 Elsevier Patient Education  2021 [employer].

## 2020-07-11 ENCOUNTER — Other Ambulatory Visit (HOSPITAL_COMMUNITY)
Admission: RE | Admit: 2020-07-11 | Discharge: 2020-07-11 | Disposition: A | Discharge status: Home / Self Care | Payer: Medicare Other | Source: Ambulatory Visit | Attending: Cardiology | Admitting: Cardiology

## 2020-07-11 ENCOUNTER — Encounter (HOSPITAL_COMMUNITY): Payer: Self-pay

## 2020-07-11 ENCOUNTER — Other Ambulatory Visit: Payer: Self-pay

## 2020-07-11 ENCOUNTER — Encounter (HOSPITAL_COMMUNITY)
Admission: RE | Admit: 2020-07-11 | Discharge: 2020-07-11 | Disposition: A | Discharge status: Home / Self Care | Payer: Medicare Other | Source: Ambulatory Visit | Attending: Cardiology | Admitting: Cardiology

## 2020-07-11 ENCOUNTER — Other Ambulatory Visit: Payer: Self-pay | Admitting: Cardiology

## 2020-07-11 DIAGNOSIS — Z20822 Contact with and (suspected) exposure to covid-19: Secondary | ICD-10-CM | POA: Diagnosis not present

## 2020-07-11 DIAGNOSIS — Z01812 Encounter for preprocedural laboratory examination: Secondary | ICD-10-CM | POA: Diagnosis not present

## 2020-07-11 LAB — CBC WITH DIFFERENTIAL/PLATELET
Abs Immature Granulocytes: 0.04 10*3/uL (ref 0.00–0.07)
Basophils Absolute: 0.1 10*3/uL (ref 0.0–0.1)
Basophils Relative: 1 %
Eosinophils Absolute: 0.3 10*3/uL (ref 0.0–0.5)
Eosinophils Relative: 2 %
HCT: 48.8 % — ABNORMAL HIGH (ref 36.0–46.0)
Hemoglobin: 15.6 g/dL — ABNORMAL HIGH (ref 12.0–15.0)
Immature Granulocytes: 0 %
Lymphocytes Relative: 40 %
Lymphs Abs: 4.7 10*3/uL — ABNORMAL HIGH (ref 0.7–4.0)
MCH: 29.4 pg (ref 26.0–34.0)
MCHC: 32 g/dL (ref 30.0–36.0)
MCV: 91.9 fL (ref 80.0–100.0)
Monocytes Absolute: 1 10*3/uL (ref 0.1–1.0)
Monocytes Relative: 8 %
Neutro Abs: 5.7 10*3/uL (ref 1.7–7.7)
Neutrophils Relative %: 49 %
Platelets: 268 10*3/uL (ref 150–400)
RBC: 5.31 MIL/uL — ABNORMAL HIGH (ref 3.87–5.11)
RDW: 15.1 % (ref 11.5–15.5)
WBC: 11.8 10*3/uL — ABNORMAL HIGH (ref 4.0–10.5)
nRBC: 0 % (ref 0.0–0.2)

## 2020-07-11 LAB — BASIC METABOLIC PANEL
Anion gap: 9 (ref 5–15)
BUN: 23 mg/dL (ref 8–23)
CO2: 25 mmol/L (ref 22–32)
Calcium: 9.3 mg/dL (ref 8.9–10.3)
Chloride: 105 mmol/L (ref 98–111)
Creatinine, Ser: 0.99 mg/dL (ref 0.44–1.00)
GFR, Estimated: >60 mL/min (ref >60)
Glucose, Bld: 92 mg/dL (ref 70–99)
Potassium: 3.8 mmol/L (ref 3.5–5.1)
Sodium: 139 mmol/L (ref 135–145)

## 2020-07-11 LAB — SARS CORONAVIRUS 2 (TAT 6-24 HRS): SARS Coronavirus 2: NEGATIVE

## 2020-07-14 ENCOUNTER — Ambulatory Visit (HOSPITAL_COMMUNITY): Payer: Medicare Other | Admitting: Anesthesiology

## 2020-07-14 ENCOUNTER — Encounter (HOSPITAL_COMMUNITY): Payer: Self-pay | Admitting: Cardiology

## 2020-07-14 ENCOUNTER — Encounter (HOSPITAL_COMMUNITY)
Admission: RE | Disposition: A | Discharge status: Home / Self Care | Payer: Self-pay | Source: Home / Self Care | Attending: Cardiology

## 2020-07-14 ENCOUNTER — Other Ambulatory Visit: Payer: Self-pay | Admitting: Cardiology

## 2020-07-14 ENCOUNTER — Ambulatory Visit (HOSPITAL_COMMUNITY)
Admission: RE | Admit: 2020-07-14 | Discharge: 2020-07-14 | Disposition: A | Discharge status: Home / Self Care | Payer: Medicare Other | Attending: Cardiology | Admitting: Cardiology

## 2020-07-14 DIAGNOSIS — Z7901 Long term (current) use of anticoagulants: Secondary | ICD-10-CM | POA: Diagnosis not present

## 2020-07-14 DIAGNOSIS — Z89512 Acquired absence of left leg below knee: Secondary | ICD-10-CM | POA: Insufficient documentation

## 2020-07-14 DIAGNOSIS — I10 Essential (primary) hypertension: Secondary | ICD-10-CM | POA: Insufficient documentation

## 2020-07-14 DIAGNOSIS — Z9049 Acquired absence of other specified parts of digestive tract: Secondary | ICD-10-CM | POA: Insufficient documentation

## 2020-07-14 DIAGNOSIS — I2510 Atherosclerotic heart disease of native coronary artery without angina pectoris: Secondary | ICD-10-CM | POA: Insufficient documentation

## 2020-07-14 DIAGNOSIS — I4819 Other persistent atrial fibrillation: Secondary | ICD-10-CM | POA: Insufficient documentation

## 2020-07-14 DIAGNOSIS — Z79899 Other long term (current) drug therapy: Secondary | ICD-10-CM | POA: Insufficient documentation

## 2020-07-14 DIAGNOSIS — Z87891 Personal history of nicotine dependence: Secondary | ICD-10-CM | POA: Diagnosis not present

## 2020-07-14 DIAGNOSIS — Z85828 Personal history of other malignant neoplasm of skin: Secondary | ICD-10-CM | POA: Diagnosis not present

## 2020-07-14 DIAGNOSIS — Z951 Presence of aortocoronary bypass graft: Secondary | ICD-10-CM | POA: Diagnosis not present

## 2020-07-14 HISTORY — PX: CARDIOVERSION: SHX1299

## 2020-07-14 SURGERY — CARDIOVERSION
Anesthesia: General

## 2020-07-14 MED ORDER — ORAL CARE MOUTH RINSE: 15.0000 mL | Freq: Once | OROMUCOSAL | Status: AC

## 2020-07-14 MED ORDER — LACTATED RINGERS IV SOLN: INTRAVENOUS | Status: DC

## 2020-07-14 MED ORDER — PROPOFOL 10 MG/ML IV BOLUS
INTRAVENOUS | Status: DC | PRN
  Administered 2020-07-14: 100 mg via INTRAVENOUS

## 2020-07-14 MED ORDER — CHLORHEXIDINE GLUCONATE 0.12 % MT SOLN
15.0000 mL | Freq: Once | OROMUCOSAL | Status: AC
  Administered 2020-07-14: 15 mL via OROMUCOSAL

## 2020-07-14 NOTE — Discharge Instructions (Signed)

## 2020-07-14 NOTE — Anesthesia Postprocedure Evaluation (Signed)
Anesthesia Post Note  Patient: Julia West  Procedure(s) Performed: CARDIOVERSION (N/A )  Patient location during evaluation: PACU Anesthesia Type: General Level of consciousness: awake and alert and oriented Pain management: pain level controlled Vital Signs Assessment: post-procedure vital signs reviewed and stable Respiratory status: spontaneous breathing and respiratory function stable Cardiovascular status: blood pressure returned to baseline and stable Postop Assessment: no apparent nausea or vomiting Anesthetic complications: no   No complications documented.   Last Vitals:  Vitals:   07/14/20 1300 07/14/20 1321  BP:  (!) 148/82  Pulse: (!) 53 64  Resp: 16 18  Temp:  36.4 C  SpO2: 96% 96%    Last Pain:  Vitals:   07/14/20 1321  TempSrc: Oral  PainSc: 0-No pain                 Taeko Schaffer C Javarious Elsayed

## 2020-07-14 NOTE — CV Procedure (Signed)
CV Procedure Note  Procedure: Electrical cardioversion Physician: Dr Carlyle Dolly MD Indication: Persistent atrial fibrillation  Patient was brought to the procedure suite after appropriate consent was obtained. I verbally confirmed with the patient she had not missed any doses of her eliquis within the last 3 weeks. Defib pads were placed in the anterior and posterior positions. Sedation was achieved with the assistance of anesthesiology, for full details please refer to there documentation. She was succesfullly cardioverted to normal sinus rhythm with a single synchronized 200j shock. Cardiopulmonary monitoring was performed throughout the procedure, she tolerated well without complications   Carlyle Dolly MD

## 2020-07-14 NOTE — Transfer of Care (Signed)
mmediate Anesthesia Transfer of Care Note  Patient: Julia West  Procedure(s) Performed: CARDIOVERSION (N/A )  Patient Location: PACU  Anesthesia Type:General  Level of Consciousness: awake and patient cooperative  Airway & Oxygen Therapy: Patient Spontanous Breathing and Patient connected to nasal cannula oxygen  Post-op Assessment: Report given to RN and Post -op Vital signs reviewed and stable  Post vital signs: Reviewed and stable  Last Vitals:  Vitals Value Taken Time  BP 100/59   Temp 98.8   Pulse 60   Resp 16   SpO2 93     Last Pain:  Vitals:   07/14/20 0838  TempSrc: Oral  PainSc:          Complications: No complications documented.

## 2020-07-14 NOTE — Progress Notes (Signed)
Post cardioversion EKG done.

## 2020-07-14 NOTE — Anesthesia Procedure Notes (Signed)
Date/Time: 07/14/2020 11:54 AM Performed by: Vista Deck, CRNA Pre-anesthesia Checklist: Patient identified, Emergency Drugs available, Suction available, Timeout performed and Patient being monitored Patient Re-evaluated:Patient Re-evaluated prior to induction Oxygen Delivery Method: Nasal Cannula

## 2020-07-14 NOTE — H&P (Signed)
Patient presents for outapatient electrical cardioversion for atrial fibrillation. For full medical history please refer to recent clinic note referenced below. She is on eliquis at home for anticoagulation. Plan for electrical cardioversion today with assistance from anesthesiology.   Julia Dolly MD    Cardiology Office Note   Date:  07/09/2020   ID:  Julia West, DOB 01/29/1956, MRN [id number]  PCP:  [name], MD      Cardiologist:   No primary care provider on file.     Chief Complaint  Patient presents with  . Palpitations       History of Present Illness: Julia West is a 65 y.o. female who presented for evaluation of CAD.  At the last visit she was noted to be in atrial fib.   She wore a monitor and was noted to be persistent atrial fib with some tachybradycardia rates   she gets around with crutches on her prosthesis.  She says at night she does feel her heart racing at times.  She does not really feel it racing during the day.  She denies any presyncope or syncope.  She is not having any chest pressure, neck or arm discomfort.  She has been losing weight with diet and she is being very careful about this.  She is not having any new edema.  She tolerated her blood thinner and is not having any evidence of bleeding.        Past Medical History:  Diagnosis Date  . Acute myocardial infarction    with a ruptured plaque in the circumflex in 2003  . Anxiety   . Bilateral swelling of feet   . Cancer ([place])    basal cell face  . Chest pain   . Complication of anesthesia    states low O2 sats post-op 11/13, always slow to awaken  . Degenerative joint disease   . 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 Depression   . Dyspnea    with activity- car to house - steps  . Fatty liver   . Gallbladder problem   . Heart disease   . HTN (hypertension)   . Hyperlipidemia LDL goal <70   . Incarcerated hernia   .  Internal and external hemorrhoids without complication   . Joint pain   . Lymphedema    right side of body  . Lymphedema of arm    right  . Morbid obesity ([place])   . Rheumatoid arthritis ([place])   . Surgical wound, non healing ABDOMINAL    has wound vac @ 125 mm Hg  . Ventral hernia          Past Surgical History:  Procedure Laterality Date  . AMPUTATION Left 05/06/2017   Procedure: BELOW KNEE AMPUTATION;  Surgeon: Newt Minion, MD;  Location: [place];  Service: Orthopedics;  Laterality: Left;  . APPLICATION OF WOUND VAC    . BOWEL RESECTION  02/07/2012   Procedure: SMALL BOWEL RESECTION;  Surgeon: Gayland Curry, MD,FACS;  Location: [place];  Service: General;;  . CARDIOVASCULAR STRESS TEST  01-17-2012  DR HOCHREIN   LOW RISK NUCLEAR TEST  . CESAREAN SECTION     x 4 in remote past  . CHOLECYSTECTOMY    . CORONARY ARTERY BYPASS GRAFT  2003   by Dr. Servando Snare. LIMA to the LAD, free RIMA to the circumflex. Stress perfusion study December 2009 with no high-risk areas of ischemia. She has a well-preserved ejection  fraction  . HYSTEROSCOPY WITH D & C N/A 05/14/2014   Procedure: DILATATION AND CURETTAGE (no specimen); HYSTEROSCOPY;  Surgeon: Jonnie Kind, MD;  Location: [facility];  Service: Gynecology;  Laterality: N/A;  . I & D EXTREMITY Left 06/21/2017   Procedure: IRRIGATION AND DEBRIDEMENT OF LEFT LEG AMPUTATION SITE;  Surgeon: Wallace Going, DO;  Location: [facility];  Service: Plastics;  Laterality: Left;  . INCISION AND DRAINAGE OF WOUND  04/17/2012   Procedure: IRRIGATION AND DEBRIDEMENT WOUND;  Surgeon: Theodoro Kos, DO;  Location: [place];  Service: Plastics;  Laterality: N/A;  OF ABDOMINAL WOUND, SURGICAL PREP AND PLACEMENT OF VAC, REMOVAL FOREHEAD SKIN LESION  . INCISION AND DRAINAGE OF WOUND N/A 04/24/2012   Procedure: IRRIGATION AND DEBRIDEMENT WOUND;  Surgeon: Theodoro Kos, DO;  Location: [place];  Service:  Plastics;  Laterality: N/A;  I & D ABDOMINAL WOUND WITH VAC AND ACELL  . INCISION AND DRAINAGE OF WOUND N/A 05/01/2012   Procedure: IRRIGATION AND DEBRIDEMENT WOUND;  Surgeon: Theodoro Kos, DO;  Location: [place];  Service: Plastics;  Laterality: N/A;  WITH SURGICAL PREP AND PLACEMENT OF VAC  . INCISION AND DRAINAGE OF WOUND N/A 05/08/2012   Procedure: IRRIGATION AND DEBRIDEMENT OF ABD WOUND SURGICAL PREP AND PLACEMENT OF VAC ;  Surgeon: Theodoro Kos, DO;  Location: [place];  Service: Plastics;  Laterality: N/A;  IRRIGATION AND DEBRIDEMENT OF ABD WOUND SURGICAL PREP AND PLACEMENT OF VAC   . INCISION AND DRAINAGE OF WOUND N/A 05/15/2012   Procedure: IRRIGATION AND DEBRIDEMENT OF ABDOMINAL WOUND WITH POSSIBLE SURGICAL PREP AND PLACEMENT OF VAC;  Surgeon: Theodoro Kos, DO;  Location: [place];  Service: Plastics;  Laterality: N/A;  . INCISION AND DRAINAGE OF WOUND N/A 05/22/2012   Procedure: IRRIGATION AND DEBRIDEMENT OF ABODOMINAL WOUND WITH  SURGICAL PREP AND VAC PLACEMENT;  Surgeon: Theodoro Kos, DO;  Location: [place];  Service: Plastics;  Laterality: N/A;  . INCISION AND DRAINAGE OF WOUND N/A 06/28/2012   Procedure: IRRIGATION AND DEBRIDEMENT OF ABDOMINAL ULCER SURGICAL PREP AND PLACEMENT OF ACELL AND VAC;  Surgeon: Theodoro Kos, DO;  Location: [place];  Service: Plastics;  Laterality: N/A;  . INCISIONAL HERNIA REPAIR  02/07/2012   Procedure: HERNIA REPAIR INCISIONAL;  Surgeon: Gayland Curry, MD,FACS;  Location: [place];  Service: General;;  Open, Primary repair, strangulated Incisional hernia.  Marland Kitchen LESION REMOVAL  04/17/2012   Procedure: LESION REMOVAL;  Surgeon: Theodoro Kos, DO;  Location: [place];  Service: Plastics;  Laterality: N/A;   CENTER OF FOREHEAD, REMOVAL FORHEAD SKIN LESION  . ORIF TOE FRACTURE Left 02/09/2017   Procedure: EXCISION TALAR HEAD, INTERNAL FIXATION MEDIAL  COLUMN LEFT FOOT;  Surgeon: Newt Minion, MD;  Location: [place];  Service: Orthopedics;  Laterality: Left;  . POLYPECTOMY N/A 05/14/2014   Procedure: ENDOMETRIAL POLYPECTOMY;  Surgeon: Jonnie Kind, MD;  Location: [facility];  Service: Gynecology;  Laterality: N/A;  . STUMP REVISION Left 08/31/2017   Procedure: REVISION LEFT BELOW KNEE AMPUTATION;  Surgeon: Newt Minion, MD;  Location: [place];  Service: Orthopedics;  Laterality: Left;           Current Outpatient Medications  Medication Sig Dispense Refill  . apixaban (ELIQUIS) 5 MG TABS tablet Take 1 tablet (5 mg total) by mouth 2 (two) times daily. 60 tablet 6  . fenofibrate 160 MG tablet TAKE 1 TABLET BY MOUTH  DAILY FOR CHOLESTEROL AND  TRIGLYCERIDES 90 tablet 1  . nitroGLYCERIN (NITROSTAT) 0.4 MG SL tablet Place 1 tablet (0.4 mg total) under the tongue every 5 (five) minutes x 3 doses as needed for chest pain. If pain persist after call 911 25 tablet 6  . sertraline (ZOLOFT) 50 MG tablet TAKE 1 TABLET BY MOUTH  DAILY 90 tablet 0  . traZODone (DESYREL) 150 MG tablet TAKE 1 OR 2 TABLETS BY  MOUTH AT BEDTIME AS NEEDED  FOR SLEEP 180 tablet 3  . valsartan-hydrochlorothiazide (DIOVAN-HCT) 160-12.5 MG tablet TAKE 1 TABLET BY MOUTH  DAILY 90 tablet 1  . Vitamin D, Ergocalciferol, (DRISDOL) 1.25 MG (50000 UNIT) CAPS capsule Take 1 capsule (50,000 Units total) by mouth every 7 (seven) days. 4 capsule 0   No current facility-administered medications for this visit.    Allergies:   Ace inhibitors    ROS:  Please see the history of present illness.   Otherwise, review of systems are positive for none.   All other systems are reviewed and negative.    PHYSICAL EXAM: VS:  BP 118/78   Pulse 68   Ht 5\' 2"  (1.575 m)   Wt 280 lb (127 kg)   BMI 51.21 kg/m  , BMI Body mass index is 51.21 kg/m. GEN:  No distress NECK:  No jugular venous distention at 90 degrees, waveform within normal limits, carotid upstroke brisk and symmetric, no  bruits, no thyromegaly LYMPHATICS:  No cervical adenopathy LUNGS:  Clear to auscultation bilaterally BACK:  No CVA tenderness CHEST:  Unremarkable HEART:  S1 and S2 within normal limits, no S3,  clicks, no rubs, no murmurs ABD:  Positive bowel sounds normal in frequency in pitch, no bruits, no rebound, no guarding, unable to assess midline mass or bruit with the patient seated. EXT:  2 plus pulses throughout, moderate edema, no cyanosis no clubbing, status post left lower extremity amputation SKIN:  No rashes no nodules NEURO:  Cranial nerves II through XII grossly intact, motor grossly intact throughout PSYCH:  Cognitively intact, oriented to person place and time   EKG:  EKG is  ordered today. Atrial fibrillation, rate 78, left axis deviation, poor anterior R wave progression, premature ectopic complexes, nonspecific diffuse T wave flattening.  Recent Labs: 05/27/2020: ALT 9; BUN 19; Creatinine, Ser 1.03; Hemoglobin 15.9; Platelets 287; Potassium 4.3; Sodium 141; TSH 0.499    Lipid Panel Labs (Brief)          Component Value Date/Time   CHOL 146 05/27/2020 1110   TRIG 118 05/27/2020 1110   TRIG 247 (H) 09/12/2013 1137   HDL 40 05/27/2020 1110   HDL 35 (L) 09/12/2013 1137   CHOLHDL 3.7 02/19/2020 0858   LDLCALC 85 05/27/2020 1110   LDLCALC 115 (H) 09/12/2013 1137           Wt Readings from Last 3 Encounters:  07/09/20 280 lb (127 kg)  06/26/20 278 lb (126.1 kg)  06/10/20 279 lb (126.6 kg)      Other studies Reviewed: Additional studies/ records that were reviewed today include: Zio patch Review of the above records demonstrates: See elsewhere   ASSESSMENT AND PLAN:  CAD: The patient has no new sypmtoms.  No further cardiovascular testing is indicated.  We will continue with aggressive risk reduction and meds as listed.  ATRIAL FIB:   Julia West has a CHA2DS2 - VASc score of 4.     She does have some tachybradycardia rates and she  feels some palpitations.  I think it is reasonable to perform a cardioversion and I will do this off of any antiarrhythmics.  She is on anticoagulation.  We discussed the risk benefits and she will agree to proceed electively.  HTN:  The blood pressure is at target.  No change in therapy.    Current medicines are reviewed at length with the patient today.  The patient does not have concerns regarding medicines.  The following changes have been made:  None  Labs/ tests ordered today include:      Orders Placed This Encounter  Procedures  . CBC  . Basic metabolic panel  . EKG 12-Lead     Disposition:   FU with me after the cardioversion.   Signed, [name], MD  07/09/2020 1:11 PM    [place]

## 2020-07-14 NOTE — Anesthesia Preprocedure Evaluation (Signed)
Anesthesia Evaluation  Patient identified by MRN, date of birth, ID band Patient awake    Reviewed: Allergy & Precautions, NPO status , Patient's Chart, lab work & pertinent test results  History of Anesthesia Complications (+) PROLONGED EMERGENCE and history of anesthetic complications (low saturations after anesthesia)  Airway Mallampati: II  TM Distance: >3 FB Neck ROM: Full    Dental  (+) Edentulous Upper, Edentulous Lower   Pulmonary shortness of breath and with exertion, former smoker,    breath sounds clear to auscultation       Cardiovascular Exercise Tolerance: Poor hypertension, Pt. on medications + CAD, + Past MI, + CABG and + DOE   Rhythm:Irregular Rate:Tachycardia  1. Left ventricular ejection fraction, by estimation, is 50 to 55%. The  left ventricle has normal function. The left ventricle has no regional  wall motion abnormalities. Left ventricular diastolic parameters are  indeterminate.  2. Right ventricular systolic function is normal. The right ventricular  size is normal. There is normal pulmonary artery systolic pressure.  3. Left atrial size was mildly dilated.  4. The mitral valve is normal in structure. Mild mitral valve  regurgitation. No evidence of mitral stenosis.  5. The aortic valve has an indeterminant number of cusps. There is mild  calcification of the aortic valve. There is mild thickening of the aortic  valve. Aortic valve regurgitation is mild. No aortic stenosis is present.  6. The inferior vena cava is normal in size with greater than 50%  respiratory variability, suggesting right atrial pressure of 3 mmHg.    Neuro/Psych    GI/Hepatic negative GI ROS, (+) Cirrhosis  (fatty liver)      ,   Endo/Other  Morbid obesity  Renal/GU negative Renal ROS     Musculoskeletal  (+) Arthritis , Osteoarthritis and Rheumatoid disorders,    Abdominal   Peds  Hematology negative  hematology ROS (+)   Anesthesia Other Findings Left BKA  Reproductive/Obstetrics                             Anesthesia Physical Anesthesia Plan  ASA: IV  Anesthesia Plan: General   Post-op Pain Management:    Induction: Intravenous  PONV Risk Score and Plan: Propofol infusion  Airway Management Planned: Nasal Cannula and Natural Airway  Additional Equipment:   Intra-op Plan:   Post-operative Plan:   Informed Consent: I have reviewed the patients History and Physical, chart, labs and discussed the procedure including the risks, benefits and alternatives for the proposed anesthesia with the patient or authorized representative who has indicated his/her understanding and acceptance.       Plan Discussed with: CRNA and Surgeon  Anesthesia Plan Comments:         Anesthesia Quick Evaluation

## 2020-07-14 NOTE — Progress Notes (Signed)
Electrical Cardioversion Procedure Note ANAYAH ARVANITIS [id number] 11-17-1955  Procedure: Electrical Cardioversion Indications:  Atrial Fibrillation  Procedure Details Consent: Risks of procedure as well as the alternatives and risks of each were explained to the (patient/caregiver).  Consent for procedure obtained. Time Out: Verified patient identification, verified procedure, site/side was marked, verified correct patient position, special equipment/implants available, medications/allergies/relevent history reviewed, required imaging and test results available.  Performed at 1201   Patient placed on cardiac monitor, pulse oximetry, supplemental oxygen as necessary.  Sedation given: Benzodiazepines Pacer pads placed anterior and posterior chest.  Cardioverted 1 time(s).  Cardioverted at [place].  Evaluation Findings: Post procedure EKG shows: NSR Complications: None Patient did tolerate procedure well.   Jerrye Beavers 07/14/2020, 3:38 PM

## 2020-07-16 ENCOUNTER — Encounter (HOSPITAL_COMMUNITY): Payer: Self-pay | Admitting: Cardiology

## 2020-07-21 ENCOUNTER — Ambulatory Visit (INDEPENDENT_AMBULATORY_CARE_PROVIDER_SITE_OTHER): Payer: Medicare Other | Admitting: Family Medicine

## 2020-07-24 ENCOUNTER — Other Ambulatory Visit: Payer: Self-pay

## 2020-07-24 ENCOUNTER — Encounter (INDEPENDENT_AMBULATORY_CARE_PROVIDER_SITE_OTHER): Payer: Self-pay | Admitting: Family Medicine

## 2020-07-24 ENCOUNTER — Ambulatory Visit (INDEPENDENT_AMBULATORY_CARE_PROVIDER_SITE_OTHER): Payer: Medicare Other | Admitting: Family Medicine

## 2020-07-24 VITALS — BP 116/61 | HR 55 | Temp 97.8°F | Ht 62.0 in | Wt 276.0 lb

## 2020-07-24 DIAGNOSIS — E559 Vitamin D deficiency, unspecified: Secondary | ICD-10-CM | POA: Diagnosis not present

## 2020-07-24 DIAGNOSIS — Z6841 Body Mass Index (BMI) 40.0 and over, adult: Secondary | ICD-10-CM | POA: Diagnosis not present

## 2020-07-24 DIAGNOSIS — I4891 Unspecified atrial fibrillation: Secondary | ICD-10-CM | POA: Diagnosis not present

## 2020-07-28 NOTE — Progress Notes (Signed)
Chief Complaint:   OBESITY Julia West is here to discuss her progress with her obesity treatment plan along with follow-up of her obesity related diagnoses. Julia West is on the Category 3 Plan and states she is following her eating plan approximately 92% of the time. Julia West states she is not currently exercising.  Today's visit was #: 4 Starting weight: 279 lbs Starting date: 05/27/2020 Today's weight: 276 lbs Today's date: 07/24/2020 Total lbs lost to date: 3 Total lbs lost since last in-office visit: 2  Interim History: Pt is not understanding why she only lost 2 lbs. She had cardioversion last week. She is getting ~1200 cal/day and eating mostly protein. She has no upcoming plans and plans to stick to category 3.  Subjective:   1. Atrial fibrillation, unspecified type ([place]) Pt had cardioversion recently. She is now in normal sinus rhythm.   2. Vitamin D deficiency Pt denies nausea, vomiting, and muscle weakness but notes fatigue. Pt is on prescription Vit D. Her last Vit D level was 15.7.  Assessment/Plan:   1. Atrial fibrillation, unspecified type ([place]) Follow up with Dr. Percival Spanish in early June 2022.  2. Vitamin D deficiency Low Vitamin D level contributes to fatigue and are associated with obesity, breast, and colon cancer. She agrees to continue to take prescription Vitamin D @50 ,000 IU every week and will follow-up for routine testing of Vitamin D, at least 2-3 times per year to avoid over-replacement.  3. Class 3 severe obesity with serious comorbidity and body mass index (BMI) of 50.0 to 59.9 in adult, unspecified obesity type ([place])  Julia West is currently in the action stage of change. As such, her goal is to continue with weight loss efforts. She has agreed to the Category 3 Plan.   Pt is to weigh and measure consistently for at least the next 4 days to assess where pt has been at calorie wise.  Exercise goals: No exercise has been prescribed at this  time.  Behavioral modification strategies: increasing lean protein intake, meal planning and cooking strategies, keeping healthy foods in the home and planning for success.  Julia West has agreed to follow-up with our clinic in 2-3 weeks. She was informed of the importance of frequent follow-up visits to maximize her success with intensive lifestyle modifications for her multiple health conditions.   Objective:   Blood pressure 116/61, pulse (!) 55, temperature 97.8 F (36.6 C), height 5\' 2"  (1.575 m), weight 276 lb (125.2 kg), SpO2 95 %. Body mass index is 50.48 kg/m.  General: Cooperative, alert, well developed, in no acute distress. HEENT: Conjunctivae and lids unremarkable. Cardiovascular: Regular rhythm.  Lungs: Normal work of breathing. Neurologic: No focal deficits.   Lab Results  Component Value Date   CREATININE 0.99 07/11/2020   BUN 23 07/11/2020   NA 139 07/11/2020   K 3.8 07/11/2020   CL 105 07/11/2020   CO2 25 07/11/2020   Lab Results  Component Value Date   ALT 9 05/27/2020   AST 20 05/27/2020   ALKPHOS 42 (L) 05/27/2020   BILITOT 0.5 05/27/2020   Lab Results  Component Value Date   HGBA1C 5.7 (H) 05/27/2020   HGBA1C 5.3 02/19/2020   HGBA1C 5.6 08/16/2019   HGBA1C 5.3 08/24/2017   HGBA1C 5.4 05/09/2017   Lab Results  Component Value Date   INSULIN 12.4 05/27/2020   Lab Results  Component Value Date   TSH 0.499 05/27/2020   Lab Results  Component Value Date   CHOL 146 05/27/2020  HDL 40 05/27/2020   LDLCALC 85 05/27/2020   TRIG 118 05/27/2020   CHOLHDL 3.7 02/19/2020   Lab Results  Component Value Date   WBC 11.8 (H) 07/11/2020   HGB 15.6 (H) 07/11/2020   HCT 48.8 (H) 07/11/2020   MCV 91.9 07/11/2020   PLT 268 07/11/2020    Attestation Statements:   Reviewed by clinician on day of visit: allergies, medications, problem list, medical history, surgical history, family history, social history, and previous encounter notes.  Time spent on  visit including pre-visit chart review and post-visit care and charting was 12 minutes.   Coral Ceo, CMA, am acting as transcriptionist for Coralie Common, MD.  I have reviewed the above documentation for accuracy and completeness, and I agree with the above. - Jinny Blossom, MD

## 2020-08-19 ENCOUNTER — Other Ambulatory Visit: Payer: Self-pay

## 2020-08-19 ENCOUNTER — Encounter: Payer: Self-pay | Admitting: Family Medicine

## 2020-08-19 ENCOUNTER — Ambulatory Visit (INDEPENDENT_AMBULATORY_CARE_PROVIDER_SITE_OTHER): Payer: Medicare Other | Admitting: Family Medicine

## 2020-08-19 VITALS — BP 107/69 | HR 84 | Temp 97.1°F | Ht 62.0 in | Wt 278.2 lb

## 2020-08-19 DIAGNOSIS — I2510 Atherosclerotic heart disease of native coronary artery without angina pectoris: Secondary | ICD-10-CM

## 2020-08-19 DIAGNOSIS — I4819 Other persistent atrial fibrillation: Secondary | ICD-10-CM

## 2020-08-19 DIAGNOSIS — Z89512 Acquired absence of left leg below knee: Secondary | ICD-10-CM

## 2020-08-19 DIAGNOSIS — E785 Hyperlipidemia, unspecified: Secondary | ICD-10-CM | POA: Diagnosis not present

## 2020-08-19 MED ORDER — FENOFIBRATE 160 MG PO TABS: 160.0000 mg | ORAL_TABLET | Freq: Every day | ORAL | 3 refills | Status: DC

## 2020-08-19 MED ORDER — VALSARTAN-HYDROCHLOROTHIAZIDE 160-12.5 MG PO TABS: 1.0000 | ORAL_TABLET | Freq: Every day | ORAL | 1 refills | Status: DC

## 2020-08-19 MED ORDER — SERTRALINE HCL 50 MG PO TABS: 1.0000 | ORAL_TABLET | Freq: Every day | ORAL | 1 refills | Status: DC

## 2020-08-19 MED ORDER — TRAZODONE HCL 150 MG PO TABS: 150.0000 mg | ORAL_TABLET | Freq: Every evening | ORAL | 3 refills | Status: DC | PRN

## 2020-08-19 MED ORDER — FEXOFENADINE HCL 180 MG PO TABS: 180.0000 mg | ORAL_TABLET | Freq: Every day | ORAL | 3 refills | Status: DC

## 2020-08-19 MED ORDER — AMOXICILLIN-POT CLAVULANATE 875-125 MG PO TABS: 1.0000 | ORAL_TABLET | Freq: Two times a day (BID) | ORAL | 0 refills | Status: DC

## 2020-08-19 NOTE — Progress Notes (Signed)
pal    Cardiology Office Note   Date:  08/20/2020   ID:  Julia West, DOB 09/03/1955, MRN [id number]  PCP:  [name], MD  Cardiologist:   [name], MD  Chief Complaint  Patient presents with  . Atrial Fibrillation       History of Present Illness: Julia West is a 65 y.o. female who presented for evaluation of CAD. She was found to be in atrial fib at a previous visit.   She wore a monitor and was noted to be persistent atrial fib with some tachybradycardia rates   I sent her for cardioversion last month.  She gets around with crutches on her prosthesis.    She said about 2 weeks after her cardioversion she felt flushed and short of breath and her heart was racing.  She knew she was back in atrial fibrillation but she waited till this appointment and indeed she is back in atrial fibrillation.  She says her heart rate ranges from the 40s to the 105 or so range.  She does not feel quite as well with the fibrillation although it is really the onset that she thinks is most problematic.  She is not having any PND or orthopnea.  She is not having any chest pressure, neck or arm discomfort.  She can feel that her heart is irregular.   Past Medical History:  Diagnosis Date  . Acute myocardial infarction    with a ruptured plaque in the circumflex in 2003  . Anxiety   . Bilateral swelling of feet   . Cancer ([place])    basal cell face  . Chest pain   . Complication of anesthesia    states low O2 sats post-op 11/13, always slow to awaken  . Degenerative joint disease   . 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 Depression   . Dyspnea    with activity- car to house - steps  . Fatty liver   . Gallbladder problem   . Heart disease   . HTN (hypertension)   . Hyperlipidemia LDL goal <70   . Incarcerated hernia   . Internal and external hemorrhoids without complication   . Joint pain   . Lymphedema    right side of body  . Lymphedema of arm    right   . Morbid obesity ([place])   . Rheumatoid arthritis ([place])   . Surgical wound, non healing ABDOMINAL    has wound vac @ 125 mm Hg  . Ventral hernia     Past Surgical History:  Procedure Laterality Date  . AMPUTATION Left 05/06/2017   Procedure: BELOW KNEE AMPUTATION;  Surgeon: Newt Minion, MD;  Location: [place];  Service: Orthopedics;  Laterality: Left;  . APPLICATION OF WOUND VAC    . BOWEL RESECTION  02/07/2012   Procedure: SMALL BOWEL RESECTION;  Surgeon: Gayland Curry, MD,FACS;  Location: [place];  Service: General;;  . CARDIOVASCULAR STRESS TEST  01-17-2012  DR Elynore Dolinski   LOW RISK NUCLEAR TEST  . CARDIOVERSION N/A 07/14/2020   Procedure: CARDIOVERSION;  Surgeon: Arnoldo Lenis, MD;  Location: [facility];  Service: Endoscopy;  Laterality: N/A;  . CESAREAN SECTION     x 4 in remote past  . CHOLECYSTECTOMY    . CORONARY ARTERY BYPASS GRAFT  2003   by Dr. Servando Snare. LIMA to the LAD, free RIMA to the circumflex. Stress perfusion study December 2009 with no high-risk areas of ischemia.  She has a well-preserved ejection fraction  . HYSTEROSCOPY WITH D & C N/A 05/14/2014   Procedure: DILATATION AND CURETTAGE (no specimen); HYSTEROSCOPY;  Surgeon: Jonnie Kind, MD;  Location: [facility];  Service: Gynecology;  Laterality: N/A;  . I & D EXTREMITY Left 06/21/2017   Procedure: IRRIGATION AND DEBRIDEMENT OF LEFT LEG AMPUTATION SITE;  Surgeon: Wallace Going, DO;  Location: [facility];  Service: Plastics;  Laterality: Left;  . INCISION AND DRAINAGE OF WOUND  04/17/2012   Procedure: IRRIGATION AND DEBRIDEMENT WOUND;  Surgeon: Theodoro Kos, DO;  Location: [place];  Service: Plastics;  Laterality: N/A;  OF ABDOMINAL WOUND, SURGICAL PREP AND PLACEMENT OF VAC, REMOVAL FOREHEAD SKIN LESION  . INCISION AND DRAINAGE OF WOUND N/A 04/24/2012   Procedure: IRRIGATION AND DEBRIDEMENT WOUND;  Surgeon: Theodoro Kos, DO;  Location: [place];  Service: Plastics;  Laterality: N/A;  I & D  ABDOMINAL WOUND WITH VAC AND ACELL  . INCISION AND DRAINAGE OF WOUND N/A 05/01/2012   Procedure: IRRIGATION AND DEBRIDEMENT WOUND;  Surgeon: Theodoro Kos, DO;  Location: [place];  Service: Plastics;  Laterality: N/A;  WITH SURGICAL PREP AND PLACEMENT OF VAC  . INCISION AND DRAINAGE OF WOUND N/A 05/08/2012   Procedure: IRRIGATION AND DEBRIDEMENT OF ABD WOUND SURGICAL PREP AND PLACEMENT OF VAC ;  Surgeon: Theodoro Kos, DO;  Location: [place];  Service: Plastics;  Laterality: N/A;  IRRIGATION AND DEBRIDEMENT OF ABD WOUND SURGICAL PREP AND PLACEMENT OF VAC   . INCISION AND DRAINAGE OF WOUND N/A 05/15/2012   Procedure: IRRIGATION AND DEBRIDEMENT OF ABDOMINAL WOUND WITH POSSIBLE SURGICAL PREP AND PLACEMENT OF VAC;  Surgeon: Theodoro Kos, DO;  Location: [place];  Service: Plastics;  Laterality: N/A;  . INCISION AND DRAINAGE OF WOUND N/A 05/22/2012   Procedure: IRRIGATION AND DEBRIDEMENT OF ABODOMINAL WOUND WITH  SURGICAL PREP AND VAC PLACEMENT;  Surgeon: Theodoro Kos, DO;  Location: [place];  Service: Plastics;  Laterality: N/A;  . INCISION AND DRAINAGE OF WOUND N/A 06/28/2012   Procedure: IRRIGATION AND DEBRIDEMENT OF ABDOMINAL ULCER SURGICAL PREP AND PLACEMENT OF ACELL AND VAC;  Surgeon: Theodoro Kos, DO;  Location: [place];  Service: Plastics;  Laterality: N/A;  . INCISIONAL HERNIA REPAIR  02/07/2012   Procedure: HERNIA REPAIR INCISIONAL;  Surgeon: Gayland Curry, MD,FACS;  Location: [place];  Service: General;;  Open, Primary repair, strangulated Incisional hernia.  Marland Kitchen LESION REMOVAL  04/17/2012   Procedure: LESION REMOVAL;  Surgeon: Theodoro Kos, DO;  Location: [place];  Service: Plastics;  Laterality: N/A;   CENTER OF FOREHEAD, REMOVAL FORHEAD SKIN LESION  . ORIF TOE FRACTURE Left 02/09/2017   Procedure: EXCISION TALAR HEAD, INTERNAL FIXATION MEDIAL COLUMN LEFT FOOT;  Surgeon: Newt Minion,  MD;  Location: [place];  Service: Orthopedics;  Laterality: Left;  . POLYPECTOMY N/A 05/14/2014   Procedure: ENDOMETRIAL POLYPECTOMY;  Surgeon: Jonnie Kind, MD;  Location[facility];  Service: Gynecology;  Laterality: N/A;  . STUMP REVISION Left 08/31/2017   Procedure: REVISION LEFT BELOW KNEE AMPUTATION;  Surgeon: Newt Minion, MD;  Location: [place];  Service: Orthopedics;  Laterality: Left;     Current Outpatient Medications  Medication Sig Dispense Refill  . amoxicillin-clavulanate (AUGMENTIN) 875-125 MG tablet Take 1 tablet by mouth 2 (two) times daily. Take all of this medication 20 tablet 0  . apixaban (ELIQUIS) 5 MG TABS tablet Take 1 tablet (  5 mg total) by mouth 2 (two) times daily. 60 tablet 6  . atorvastatin (LIPITOR) 20 MG tablet Take 1 tablet (20 mg total) by mouth daily. 90 tablet 3  . fenofibrate 160 MG tablet Take 1 tablet (160 mg total) by mouth daily. 90 tablet 3  . fexofenadine (ALLEGRA) 180 MG tablet Take 1 tablet (180 mg total) by mouth daily. For allergy symptoms 90 tablet 3  . fluticasone (FLONASE) 50 MCG/ACT nasal spray Place 1 spray into both nostrils daily as needed for allergies or rhinitis.    Marland Kitchen nitroGLYCERIN (NITROSTAT) 0.4 MG SL tablet Place 1 tablet (0.4 mg total) under the tongue every 5 (five) minutes x 3 doses as needed for chest pain. If pain persist after call 911 25 tablet 6  . sertraline (ZOLOFT) 50 MG tablet Take 1 tablet (50 mg total) by mouth daily. 90 tablet 1  . traZODone (DESYREL) 150 MG tablet Take 1-2 tablets (150-300 mg total) by mouth at bedtime as needed for sleep. 180 tablet 3  . valsartan-hydrochlorothiazide (DIOVAN-HCT) 160-12.5 MG tablet Take 1 tablet by mouth daily. 90 tablet 1  . Vitamin D, Ergocalciferol, (DRISDOL) 1.25 MG (50000 UNIT) CAPS capsule Take 1 capsule (50,000 Units total) by mouth every 7 (seven) days. 4 capsule 0   No current facility-administered medications for this visit.    Allergies:   Ace inhibitors    ROS:  Please see  the history of present illness.   Otherwise, review of systems are positive for none.   All other systems are reviewed and negative.    PHYSICAL EXAM: VS:  BP 110/80   Pulse (!) 105   Ht 5\' 6"  (1.676 m)   Wt 279 lb (126.6 kg)   BMI 45.03 kg/m  , BMI Body mass index is 45.03 kg/m. GENERAL:  Well appearing NECK:  No jugular venous distention, waveform within normal limits, carotid upstroke brisk and symmetric, no bruits, no thyromegaly LUNGS:  Clear to auscultation bilaterally CHEST:  Unremarkable HEART:  PMI not displaced or sustained,S1 and S2 within normal limits, no S3,  no clicks, no rubs, no murmurs, irregular ABD:  Flat, positive bowel sounds normal in frequency in pitch, no bruits, no rebound, no guarding, no midline pulsatile mass, no hepatomegaly, no splenomegaly, status post left lower extremity amputation.   EXT:  2 plus pulses upper and decreased dorsalis pedis and posterior tibialis on the right, no edema, no cyanosis no clubbing   EKG:  EKG is  ordered today. Atrial fibrillation, rate 105 , left axis deviation, poor anterior R wave progression, premature ectopic complexes, nonspecific diffuse T wave flattening.  Premature ventricular contractions  Recent Labs: 05/27/2020: TSH 0.499 08/19/2020: ALT 8; BUN 23; Creatinine, Ser 1.20; Hemoglobin 15.7; Platelets 287; Potassium 4.3; Sodium 141    Lipid Panel    Component Value Date/Time   CHOL 144 08/19/2020 0909   TRIG 107 08/19/2020 0909   TRIG 247 (H) 09/12/2013 1137   HDL 41 08/19/2020 0909   HDL 35 (L) 09/12/2013 1137   CHOLHDL 3.5 08/19/2020 0909   LDLCALC 83 08/19/2020 0909   LDLCALC 115 (H) 09/12/2013 1137      Wt Readings from Last 3 Encounters:  08/20/20 279 lb (126.6 kg)  08/19/20 278 lb 3.2 oz (126.2 kg)  07/24/20 276 lb (125.2 kg)      Other studies Reviewed: Additional studies/ records that were reviewed today include: None  Review of the above records demonstrates: See elsewhere   ASSESSMENT AND  PLAN:  CAD:   She has no new symptoms.  We will continue with risk reduction.  ATRIAL FIB:   Julia West has a CHA2DS2 - VASc score of 4.   She is status post DCCV.  However, she has had symptomatic recurrence.  I think then she needs an antiarrhythmic.  I would suggest Tikosyn and I will arrange a Atrial Fibrillation Clinic visit to arrange this.  She will continue with anticoagulation.  HTN:  The blood pressure is at target.  No change in therapy.   DYSLIPIDEMIA: She did have an LDL yesterday of 85 HDL 41.  She should be on a statin.  I will start her on Lipitor 20 mg daily and she can get a lipid profile in about 3 months.  Current medicines are reviewed at length with the patient today.  The patient does not have concerns regarding medicines.  The following changes have been made: As above  Labs/ tests ordered today include:   Orders Placed This Encounter  Procedures  . EKG 12-Lead     Disposition:   FU with me in 3 months  Signed, [name], MD  08/20/2020 11:41 AM    [place]

## 2020-08-19 NOTE — Progress Notes (Signed)
Subjective:  Patient ID: Julia West, female    DOB: 10/01/1955  Age: 65 y.o. MRN: [id number]  CC: Medical Management of Chronic Issues   HPI Julia West presents for Atrial fibrillation follow up. Pt. is treated with rate control and anticoagulation. Pt.  denies palpitations, rapid rate, chest pain, dyspnea and edema. There has been no bleeding from nose or gums. Pt. has not noticed blood with urine or stool.  Although there is routine bruising easily, it is not excessive.   presents for  follow-up of hypertension. Patient has no history of headache chest pain or shortness of breath or recent cough. Patient also denies symptoms of TIA such as focal numbness or weakness. Patient denies side effects from medication. States taking it regularly.   in for follow-up of elevated cholesterol. Doing well without complaints on current medication. Denies side effects of statin including myalgia and arthralgia and nausea. Currently no chest pain, shortness of breath or other cardiovascular related symptoms noted.  ASCVD stable. No chest pain, DOE. Taking multiple preventive meds including atorva, valsartan, apaxiban.      Depression screen [facility] 2/9 05/27/2020 02/19/2020 08/16/2019  Decreased Interest 1 0 0  Down, Depressed, Hopeless 1 0 0  PHQ - 2 Score 2 0 0  Altered sleeping 2 - -  Tired, decreased energy 1 - -  Change in appetite 1 - -  Feeling bad or failure about yourself  1 - -  Trouble concentrating 1 - -  Moving slowly or fidgety/restless 0 - -  Suicidal thoughts 0 - -  PHQ-9 Score 8 - -  Difficult doing work/chores Somewhat difficult - -  Some recent data might be hidden    History Katelynne has a past medical history of Acute myocardial infarction, Anxiety, Bilateral swelling of feet, Cancer (HCC), Chest pain, Complication of anesthesia, Degenerative joint disease, Dehiscence of amputation stump ([place]), Depression, Dyspnea, Fatty liver, Gallbladder problem, Heart disease,  HTN (hypertension), Hyperlipidemia LDL goal <70, Incarcerated hernia, Internal and external hemorrhoids without complication, Joint pain, Lymphedema, Lymphedema of arm, Morbid obesity ([place]), Rheumatoid arthritis ([place]), Surgical wound, non healing (ABDOMINAL ), and Ventral hernia.   She has a past surgical history that includes Cesarean section; Cholecystectomy; Coronary artery bypass graft (2003); Incisional hernia repair (02/07/2012); Bowel resection (02/07/2012); Application if wound vac; Incision and drainage of wound (04/17/2012); Lesion removal (04/17/2012); Incision and drainage of wound (N/A, 04/24/2012); Incision and drainage of wound (N/A, 05/01/2012); Incision and drainage of wound (N/A, 05/08/2012); Cardiovascular stress test (01-17-2012  [facility]); Incision and drainage of wound (N/A, 05/15/2012); Incision and drainage of wound (N/A, 05/22/2012); Incision and drainage of wound (N/A, 06/28/2012); polypectomy (N/A, 05/14/2014); Hysteroscopy with D & C (N/A, 05/14/2014); ORIF toe fracture (Left, 02/09/2017); Amputation (Left, 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   Her family history includes Birth defects in her son; Diabetes in her brother; Heart attack in her mother; Heart disease in her mother; Hypertension in her mother and sister; Obesity in her mother; Sudden death in her mother.She reports that she quit smoking about 8 years ago. Her smoking use included cigarettes. She has a 25.00 pack-year smoking history. She has never used smokeless tobacco. She reports that she does not drink alcohol and does not use drugs.    ROS Review of Systems  Constitutional: Negative.   HENT: Positive for congestion and postnasal drip.   Eyes: Negative for visual disturbance.  Respiratory: Negative for shortness of  breath.   Cardiovascular: Negative for chest pain.  Gastrointestinal: Negative for abdominal pain.  Musculoskeletal: Negative  for arthralgias.    Objective:  BP 107/69   Pulse 84   Temp (!) 97.1 F (36.2 C)   Ht _0  (1.575 m)   Wt 278 lb 3.2 oz (126.2 kg)   SpO2 96%   BMI 50.88 kg/m   BP Readings from Last 3 Encounters:  08/20/20 110/80  08/19/20 107/69  07/24/20 116/61    Wt Readings from Last 3 Encounters:  08/20/20 279 lb (126.6 kg)  08/19/20 278 lb 3.2 oz (126.2 kg)  07/24/20 276 lb (125.2 kg)     Physical Exam Constitutional:      General: She is not in acute distress.    Appearance: She is well-developed.  HENT:     Head: Normocephalic and atraumatic.     Right Ear: Ear canal and external ear normal. There is no impacted cerumen.     Left Ear: Tympanic membrane, ear canal and external ear normal. There is no impacted cerumen.     Ears:     Comments: Fluid behind right TM  Eyes:     Conjunctiva/sclera: Conjunctivae normal.     Pupils: Pupils are equal, round, and reactive to light.  Neck:     Thyroid: No thyromegaly.  Cardiovascular:     Rate and Rhythm: Normal rate and regular rhythm.     Heart sounds: Normal heart sounds. No murmur heard.   Pulmonary:     Effort: Pulmonary effort is normal. No respiratory distress.     Breath sounds: Normal breath sounds. No wheezing or rales.  Abdominal:     General: Bowel sounds are normal. There is no distension.     Palpations: Abdomen is soft.     Tenderness: There is no abdominal tenderness.  Musculoskeletal:        General: Normal range of motion.     Cervical back: Normal range of motion and neck supple.  Lymphadenopathy:     Cervical: No cervical adenopathy.  Skin:    General: Skin is warm and dry.  Neurological:     Mental Status: She is alert and oriented to person, place, and time.  Psychiatric:        Behavior: Behavior normal.        Thought Content: Thought content normal.        Judgment: Judgment normal.       Assessment & Plan:   Shante was seen today for medical management of chronic issues.  Diagnoses  and all orders for this visit:  Coronary artery disease involving native coronary artery of native heart without angina pectoris -     CBC with Differential/Platelet -     CMP14+EGFR -     Lipid panel  Hyperlipidemia, unspecified hyperlipidemia type -     fenofibrate 160 MG tablet; Take 1 tablet (160 mg total) by mouth daily. -     CBC with Differential/Platelet -     CMP14+EGFR -     Lipid panel  Persistent atrial fibrillation (HCC) -     CBC with Differential/Platelet -     CMP14+EGFR -     Lipid panel  Hx of BKA, left (HCC) -     CBC with Differential/Platelet -     CMP14+EGFR -     Lipid panel  Other orders -     sertraline (ZOLOFT) 50 MG tablet; Take 1 tablet (50 mg total) by mouth daily. -  traZODone (DESYREL) 150 MG tablet; Take 1-2 tablets (150-300 mg total) by mouth at bedtime as needed for sleep. -     valsartan-hydrochlorothiazide (DIOVAN-HCT) 160-12.5 MG tablet; Take 1 tablet by mouth daily. -     fexofenadine (ALLEGRA) 180 MG tablet; Take 1 tablet (180 mg total) by mouth daily. For allergy symptoms -     amoxicillin-clavulanate (AUGMENTIN) 875-125 MG tablet; Take 1 tablet by mouth 2 (two) times daily. Take all of this medication       I have changed Elmo L. Pauwels's fenofibrate, sertraline, traZODone, and valsartan-hydrochlorothiazide. I am also having her start on fexofenadine and amoxicillin-clavulanate. Additionally, I am having her maintain her nitroGLYCERIN, apixaban, Vitamin D (Ergocalciferol), and fluticasone.  Allergies as of 08/19/2020      Reactions   Ace Inhibitors Cough      Medication List       Accurate as of August 19, 2020 11:59 PM. If you have any questions, ask your nurse or doctor.        amoxicillin-clavulanate 875-125 MG tablet Commonly known as: AUGMENTIN Take 1 tablet by mouth 2 (two) times daily. Take all of this medication Started by: [name], MD   apixaban 5 MG Tabs tablet Commonly known as: ELIQUIS Take 1 tablet  (5 mg total) by mouth 2 (two) times daily.   fenofibrate 160 MG tablet Take 1 tablet (160 mg total) by mouth daily. What changed: See the new instructions. Changed by: [name], MD   fexofenadine 180 MG tablet Commonly known as: ALLEGRA Take 1 tablet (180 mg total) by mouth daily. For allergy symptoms Started by: [name], MD   fluticasone 50 MCG/ACT nasal spray Commonly known as: FLONASE Place 1 spray into both nostrils daily as needed for allergies or rhinitis.   nitroGLYCERIN 0.4 MG SL tablet Commonly known as: NITROSTAT Place 1 tablet (0.4 mg total) under the tongue every 5 (five) minutes x 3 doses as needed for chest pain. If pain persist after call 911   sertraline 50 MG tablet Commonly known as: ZOLOFT Take 1 tablet (50 mg total) by mouth daily.   traZODone 150 MG tablet Commonly known as: DESYREL Take 1-2 tablets (150-300 mg total) by mouth at bedtime as needed for sleep. What changed: See the new instructions. Changed by: [name], MD   valsartan-hydrochlorothiazide 160-12.5 MG tablet Commonly known as: DIOVAN-HCT Take 1 tablet by mouth daily.   Vitamin D (Ergocalciferol) 1.25 MG (50000 UNIT) Caps capsule Commonly known as: DRISDOL Take 1 capsule (50,000 Units total) by mouth every 7 (seven) days.        Follow-up: Return in about 6 months (around 02/18/2021).  [name], M.D.

## 2020-08-20 ENCOUNTER — Encounter: Payer: Self-pay | Admitting: Cardiology

## 2020-08-20 ENCOUNTER — Encounter: Payer: Self-pay | Admitting: Family Medicine

## 2020-08-20 ENCOUNTER — Ambulatory Visit: Payer: Medicare Other | Admitting: Cardiology

## 2020-08-20 VITALS — BP 110/80 | HR 105 | Ht 66.0 in | Wt 279.0 lb

## 2020-08-20 DIAGNOSIS — I10 Essential (primary) hypertension: Secondary | ICD-10-CM | POA: Diagnosis not present

## 2020-08-20 DIAGNOSIS — I4819 Other persistent atrial fibrillation: Secondary | ICD-10-CM

## 2020-08-20 DIAGNOSIS — I2510 Atherosclerotic heart disease of native coronary artery without angina pectoris: Secondary | ICD-10-CM

## 2020-08-20 LAB — CMP14+EGFR
ALT: 8 IU/L (ref 0–32)
AST: 13 IU/L (ref 0–40)
Albumin/Globulin Ratio: 1.8 (ref 1.2–2.2)
Albumin: 4.3 g/dL (ref 3.8–4.8)
Alkaline Phosphatase: 42 IU/L — ABNORMAL LOW (ref 44–121)
BUN/Creatinine Ratio: 19 (ref 12–28)
BUN: 23 mg/dL (ref 8–27)
Bilirubin Total: 0.4 mg/dL (ref 0.0–1.2)
CO2: 23 mmol/L (ref 20–29)
Calcium: 9.3 mg/dL (ref 8.7–10.3)
Chloride: 103 mmol/L (ref 96–106)
Creatinine, Ser: 1.2 mg/dL — ABNORMAL HIGH (ref 0.57–1.00)
Globulin, Total: 2.4 g/dL (ref 1.5–4.5)
Glucose: 94 mg/dL (ref 65–99)
Potassium: 4.3 mmol/L (ref 3.5–5.2)
Sodium: 141 mmol/L (ref 134–144)
Total Protein: 6.7 g/dL (ref 6.0–8.5)
eGFR: 50 mL/min/{1.73_m2} — ABNORMAL LOW (ref >59)

## 2020-08-20 LAB — CBC WITH DIFFERENTIAL/PLATELET
Basophils Absolute: 0.1 10*3/uL (ref 0.0–0.2)
Basos: 1 %
EOS (ABSOLUTE): 0.3 10*3/uL (ref 0.0–0.4)
Eos: 3 %
Hematocrit: 47.5 % — ABNORMAL HIGH (ref 34.0–46.6)
Hemoglobin: 15.7 g/dL (ref 11.1–15.9)
Immature Grans (Abs): 0 10*3/uL (ref 0.0–0.1)
Immature Granulocytes: 0 %
Lymphocytes Absolute: 5.2 10*3/uL — ABNORMAL HIGH (ref 0.7–3.1)
Lymphs: 44 %
MCH: 28.6 pg (ref 26.6–33.0)
MCHC: 33.1 g/dL (ref 31.5–35.7)
MCV: 87 fL (ref 79–97)
Monocytes Absolute: 0.9 10*3/uL (ref 0.1–0.9)
Monocytes: 7 %
Neutrophils Absolute: 5.3 10*3/uL (ref 1.4–7.0)
Neutrophils: 45 %
Platelets: 287 10*3/uL (ref 150–450)
RBC: 5.48 x10E6/uL — ABNORMAL HIGH (ref 3.77–5.28)
RDW: 14.2 % (ref 11.7–15.4)
WBC: 11.8 10*3/uL — ABNORMAL HIGH (ref 3.4–10.8)

## 2020-08-20 LAB — LIPID PANEL
Chol/HDL Ratio: 3.5 ratio (ref 0.0–4.4)
Cholesterol, Total: 144 mg/dL (ref 100–199)
HDL: 41 mg/dL (ref >39)
LDL Chol Calc (NIH): 83 mg/dL (ref 0–99)
Triglycerides: 107 mg/dL (ref 0–149)
VLDL Cholesterol Cal: 20 mg/dL (ref 5–40)

## 2020-08-20 MED ORDER — ATORVASTATIN CALCIUM 20 MG PO TABS: 20.0000 mg | ORAL_TABLET | Freq: Every day | ORAL | 3 refills | Status: DC

## 2020-08-20 NOTE — Patient Instructions (Addendum)
Medication Instructions:  Please start Atorvastatin 20 mg a day. Continue all other medications as listed.  *If you need a refill on your cardiac medications before your next appointment, please call your pharmacy*  AFIB CLINIC INFORMATION: Your appointment is scheduled on:  09/01/2020 at 10 AM.  Please arrive 15 minutes early for check-in. The AFib Clinic is located in the [facility] at [facility]. Parking instructions/directions: [address] (off [address]). When you pull in to Entrance C, there is an underground parking garage to your right. The code to enter the garage is 4233. Take the elevators to the first floor. Follow the signs to the [facility] and [facility] Clinics. You will see registration at the end of the hallway.  Phone number: [phone]  Follow-Up: At [facility], you and your health needs are our priority.  As part of our continuing mission to provide you with exceptional heart care, we have created designated Provider Care Teams.  These Care Teams include your primary Cardiologist (physician) and Advanced Practice Providers (APPs -  Physician Assistants and Nurse Practitioners) who all work together to provide you with the care you need, when you need it.  We recommend signing up for the patient portal called "MyChart".  Sign up information is provided on this After Visit Summary.  MyChart is used to connect with patients for Virtual Visits (Telemedicine).  Patients are able to view lab/test results, encounter notes, upcoming appointments, etc.  Non-urgent messages can be sent to your provider as well.   To learn more about what you can do with MyChart, go to [email or web address].    Your next appointment:   3 month(s)  The format for your next appointment:   In Person  Provider:   [name], MD   Thank you for choosing [facility]!!

## 2020-08-20 NOTE — Progress Notes (Signed)
Hello Renie,  Your lab result is normal and/or stable.Some minor variations that are not significant are commonly marked abnormal, but do not represent any medical problem for you.  Best regards, Ismar Yabut, M.D.

## 2020-08-21 ENCOUNTER — Ambulatory Visit (INDEPENDENT_AMBULATORY_CARE_PROVIDER_SITE_OTHER): Payer: Medicare Other | Admitting: Family Medicine

## 2020-08-21 ENCOUNTER — Ambulatory Visit: Payer: Medicare Other | Admitting: Orthopedic Surgery

## 2020-08-28 ENCOUNTER — Ambulatory Visit: Payer: Medicare Other | Admitting: Orthopedic Surgery

## 2020-08-28 DIAGNOSIS — Z89512 Acquired absence of left leg below knee: Secondary | ICD-10-CM

## 2020-08-28 DIAGNOSIS — I4819 Other persistent atrial fibrillation: Secondary | ICD-10-CM | POA: Diagnosis not present

## 2020-08-28 DIAGNOSIS — M79671 Pain in right foot: Secondary | ICD-10-CM

## 2020-08-28 NOTE — Progress Notes (Signed)
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  Patient is also status post a left transtibial amputation.  Patient has a history of A. fib she states that she is tried cardioversion which did not work and she states she will be admitted for evaluation on 09/01/2020 for her A. fib.  Assessment & Plan: Visit Diagnoses:  1. Persistent atrial fibrillation ([place])   2. S/P BKA (below knee amputation), left (H[facility])   3. Pain in right foot     Plan: Recommended continued conservative therapy for the right little toe.  She will continue taping as needed.  Also recommended that when she is in for her cardiology exam that she get ankle-brachial indices for the right foot.  Follow-Up Instructions: Return if symptoms worsen or fail to improve.   Ortho Exam  Patient is alert, oriented, no adenopathy, well-dressed, normal affect, normal respiratory effort. Examination patient's left transtibial amputation is stable no skin breakdown no calluses no pain with ambulation she currently ambulates with crutches.  Examination of the right foot she has a valgus deformity of the little toe with a malunion of the little toe fracture there is no ischemic changes her foot is warm no ulcers no calluses.  Patient does not have palpable pulses she does have venous and lymphatic insufficiency.  The Doppler was used and she has an irregular pulse she has a dopplerable anterior tib dorsalis pedis and posterior tibial pulse this is monophasic and irregular consistent with her A. fib.  There are no ischemic foot  changes.  Imaging: No results found. No images are attached to the encounter.  Labs: Lab Results  Component Value Date   HGBA1C 5.7 (H) 05/27/2020   HGBA1C 5.3 02/19/2020   HGBA1C 5.6 08/16/2019   ESRSEDRATE 84 (H) 09/05/2017   CRP 7.9 (H) 09/05/2017   REPTSTATUS 09/10/2017 FINAL 09/05/2017   GRAMSTAIN  04/24/2012    NO WBC SEEN NO SQUAMOUS EPITHELIAL CELLS SEEN MODERATE GRAM POSITIVE COCCI IN PAIRS RARE GRAM NEGATIVE RODS   GRAMSTAIN  04/24/2012    NO WBC SEEN NO SQUAMOUS EPITHELIAL CELLS SEEN MODERATE GRAM POSITIVE COCCI IN PAIRS RARE GRAM NEGATIVE RODS   CULT  09/05/2017    NO GROWTH 5 DAYS Performed at [facility], [place] [address] [place], [place] [address]    LABORGA PROTEUS MIRABILIS 04/24/2012     Lab Results  Component Value Date   ALBUMIN 4.3 08/19/2020   ALBUMIN 4.5 05/27/2020   ALBUMIN 4.3 02/19/2020    Lab Results  Component Value Date   MG 2.1 06/23/2017   MG 2.0 06/21/2017   Lab Results  Component Value Date   VD25OH 15.7 (L) 05/27/2020   VD25OH 25.7 (L) 12/10/2016    No results found for: PREALBUMIN CBC EXTENDED Latest Ref Rng & Units 08/19/2020 07/11/2020 05/27/2020  WBC 3.4 - 10.8 x10E3/uL 11.8(H) 11.8(H) 11.8(H)  RBC 3.77 - 5.28  x10E6/uL 5.48(H) 5.31(H) 5.40(H)  HGB 11.1 - 15.9 g/dL 15.7 15.6(H) 15.9  HCT 34.0 - 46.6 % 47.5(H) 48.8(H) 48.1(H)  PLT 150 - 450 x10E3/uL 287 268 287  NEUTROABS 1.4 - 7.0 x10E3/uL 5.3 5.7 6.0  LYMPHSABS 0.7 - 3.1 x10E3/uL 5.2(H) 4.7(H) 4.6(H)     There is no height or weight on file to calculate BMI.  Orders:  No orders of the defined types were placed in this encounter.  No orders of the defined types were placed in this encounter.    Procedures: No procedures performed  Clinical Data: No additional findings.  ROS:  All other systems negative, except as noted in the HPI. Review of Systems  Objective: Vital Signs: There were no vitals taken for this visit.  Specialty Comments:  No  specialty comments available.  PMFS History: Patient Active Problem List   Diagnosis Date Noted   Coronary artery disease involving native coronary artery of native heart without angina pectoris 07/08/2020   Persistent atrial fibrillation ([place]) 07/08/2020   Prediabetes 07/01/2020   Vitamin D deficiency 07/01/2020   Class 3 severe obesity with serious comorbidity and body mass index (BMI) of 50.0 to 59.9 in adult ([place]) 07/01/2020   Dyslipidemia 11/23/2017   S/P BKA (below knee amputation), left ([place]) 08/31/2017   Dehiscence of amputation stump ([place]) 08/29/2017   Infection of amputation stump, left lower extremity ([place]) 06/19/2017   Bradycardia 05/07/2017   Depression 05/07/2017   Lymphedema 05/07/2017   Post-menopausal bleeding 04/15/2014   Endometrial polyp 04/09/2014   Ventral hernia 03/06/2012   Open abdominal wall wound 03/06/2012   Hyperlipemia 11/12/2008   OBESITY, MORBID 11/12/2008   Essential hypertension 11/12/2008   CAD (coronary artery disease) 11/12/2008   Past Medical History:  Diagnosis Date   Acute myocardial infarction    with a ruptured plaque in the circumflex in 2003   Anxiety    Bilateral swelling of feet    Cancer ([place])    basal cell face   Chest pain    Complication of anesthesia    states low O2 sats post-op 11/13, always slow to awaken   Degenerative joint disease    Dehiscence of amputation stump ([place])    left BKA   Depression    Dyspnea    with activity- car to house - steps   Fatty liver    Gallbladder problem    Heart disease    HTN (hypertension)    Hyperlipidemia LDL goal <70    Incarcerated hernia    Internal and external hemorrhoids without complication    Joint pain    Lymphedema    right side of body   Lymphedema of arm    right   Morbid obesity ([place])    Rheumatoid arthritis ([place])    Surgical wound, non healing ABDOMINAL    has wound vac @ 125 mm Hg   Ventral hernia     Family History  Problem Relation Age of Onset    Heart attack Mother    Hypertension Mother    Heart disease Mother    Sudden death Mother    Obesity Mother    Hypertension Sister    Diabetes Brother    Birth defects Son        heart defect    Past Surgical History:  Procedure Laterality Date   AMPUTATION Left 05/06/2017   Procedure: BELOW KNEE AMPUTATION;  Surgeon: Newt Minion, MD;  Location: [facility];  Service: Orthopedics;  Laterality: Left;  APPLICATION OF WOUND VAC     BOWEL RESECTION  02/07/2012   Procedure: SMALL BOWEL RESECTION;  Surgeon: Gayland Curry, MD,FACS;  Location: [facility];  Service: General;;   CARDIOVASCULAR STRESS TEST  01-17-2012  DR HOCHREIN   LOW RISK NUCLEAR TEST   CARDIOVERSION N/A 07/14/2020   Procedure: CARDIOVERSION;  Surgeon: Arnoldo Lenis, MD;  Location: [facility];  Service: Endoscopy;  Laterality: N/A;   CESAREAN SECTION     x 4 in remote past   CHOLECYSTECTOMY     CORONARY ARTERY BYPASS GRAFT  2003   by Dr. Servando Snare. LIMA to the LAD, free RIMA to the circumflex. Stress perfusion study December 2009 with no high-risk areas of ischemia. She has a well-preserved ejection fraction   HYSTEROSCOPY WITH D & C N/A 05/14/2014   Procedure: DILATATION AND CURETTAGE (no specimen); HYSTEROSCOPY;  Surgeon: Jonnie Kind, MD;  Location: [facility];  Service: Gynecology;  Laterality: N/A;   I & D EXTREMITY Left 06/21/2017   Procedure: IRRIGATION AND DEBRIDEMENT OF LEFT LEG AMPUTATION SITE;  Surgeon: Wallace Going, DO;  Location: [facility];  Service: Plastics;  Laterality: Left;   INCISION AND DRAINAGE OF WOUND  04/17/2012   Procedure: IRRIGATION AND DEBRIDEMENT WOUND;  Surgeon: Theodoro Kos, DO;  Location: [facility];  Service: Plastics;  Laterality: N/A;  OF ABDOMINAL WOUND, SURGICAL PREP AND PLACEMENT OF VAC, REMOVAL FOREHEAD SKIN LESION   INCISION AND DRAINAGE OF WOUND N/A 04/24/2012   Procedure: IRRIGATION AND DEBRIDEMENT WOUND;  Surgeon: Theodoro Kos, DO;  Location: [facility];   Service: Plastics;  Laterality: N/A;  I & D ABDOMINAL WOUND WITH VAC AND ACELL   INCISION AND DRAINAGE OF WOUND N/A 05/01/2012   Procedure: IRRIGATION AND DEBRIDEMENT WOUND;  Surgeon: Theodoro Kos, DO;  Location: [facility];  Service: Plastics;  Laterality: N/A;  WITH SURGICAL PREP AND PLACEMENT OF VAC   INCISION AND DRAINAGE OF WOUND N/A 05/08/2012   Procedure: IRRIGATION AND DEBRIDEMENT OF ABD WOUND SURGICAL PREP AND PLACEMENT OF VAC ;  Surgeon: Theodoro Kos, DO;  Location: [facility];  Service: Plastics;  Laterality: N/A;  IRRIGATION AND DEBRIDEMENT OF ABD WOUND SURGICAL PREP AND PLACEMENT OF VAC    INCISION AND DRAINAGE OF WOUND N/A 05/15/2012   Procedure: IRRIGATION AND DEBRIDEMENT OF ABDOMINAL WOUND WITH POSSIBLE SURGICAL PREP AND PLACEMENT OF VAC;  Surgeon: Theodoro Kos, DO;  Location: [facility];  Service: Plastics;  Laterality: N/A;   INCISION AND DRAINAGE OF WOUND N/A 05/22/2012   Procedure: IRRIGATION AND DEBRIDEMENT OF ABODOMINAL WOUND WITH  SURGICAL PREP AND VAC PLACEMENT;  Surgeon: Theodoro Kos, DO;  Location: [facility];  Service: Plastics;  Laterality: N/A;   INCISION AND DRAINAGE OF WOUND N/A 06/28/2012   Procedure: IRRIGATION AND DEBRIDEMENT OF ABDOMINAL ULCER SURGICAL PREP AND PLACEMENT OF ACELL AND VAC;  Surgeon: Theodoro Kos, DO;  Location: [facility];  Service: Plastics;  Laterality: N/A;   INCISIONAL HERNIA REPAIR  02/07/2012   Procedure: HERNIA REPAIR INCISIONAL;  Surgeon: Gayland Curry, MD,FACS;  Location: [facility];  Service: General;;  Open, Primary repair, strangulated Incisional hernia.   LESION REMOVAL  04/17/2012   Procedure: LESION REMOVAL;  Surgeon: Theodoro Kos, DO;  Location: [facility];  Service: Plastics;  Laterality: N/A;   CENTER OF FOREHEAD, REMOVAL FORHEAD SKIN LESION   ORIF TOE FRACTURE Left 02/09/2017   Procedure: EXCISION TALAR HEAD, INTERNAL FIXATION MEDIAL  COLUMN  LEFT FOOT;  Surgeon: Newt Minion, MD;  Location: [facility];  Service: Orthopedics;  Laterality: Left;   POLYPECTOMY N/A 05/14/2014   Procedure: ENDOMETRIAL POLYPECTOMY;  Surgeon: Jonnie Kind, MD;  Location[facility];  Service: Gynecology;  Laterality: N/A;   STUMP REVISION Left 08/31/2017   Procedure: REVISION LEFT BELOW KNEE AMPUTATION;  Surgeon: Newt Minion, MD;  Location: [facility];  Service: Orthopedics;  Laterality: Left;   Social History   Occupational History   Occupation: Estate manager/land agent    Comment: In the [employer]   Occupation: retired  Tobacco Use   Smoking status: Former    Packs/day: 1.00    Years: 25.00    Pack years: 25.00    Types: Cigarettes    Quit date: 11/17/2011    Years since quitting: 8.7   Smokeless tobacco: Never  Vaping Use   Vaping Use: Never used  Substance and Sexual Activity   Alcohol use: No   Drug use: No   Sexual activity: Yes    Birth control/protection: Post-menopausal

## 2020-09-01 ENCOUNTER — Encounter (HOSPITAL_COMMUNITY): Payer: Self-pay | Admitting: Physician Assistant

## 2020-09-01 ENCOUNTER — Encounter: Payer: Self-pay | Admitting: Family Medicine

## 2020-09-01 ENCOUNTER — Other Ambulatory Visit (HOSPITAL_COMMUNITY): Payer: Self-pay | Admitting: *Deleted

## 2020-09-01 ENCOUNTER — Telehealth: Payer: Self-pay | Admitting: Pharmacist

## 2020-09-01 ENCOUNTER — Other Ambulatory Visit: Payer: Self-pay

## 2020-09-01 ENCOUNTER — Ambulatory Visit (HOSPITAL_COMMUNITY)
Admission: RE | Admit: 2020-09-01 | Discharge: 2020-09-01 | Disposition: A | Discharge status: Home / Self Care | Payer: Medicare Other | Source: Ambulatory Visit | Attending: Physician Assistant | Admitting: Physician Assistant

## 2020-09-01 VITALS — BP 118/66 | HR 97 | Ht 66.0 in | Wt 278.0 lb

## 2020-09-01 DIAGNOSIS — I2510 Atherosclerotic heart disease of native coronary artery without angina pectoris: Secondary | ICD-10-CM | POA: Diagnosis not present

## 2020-09-01 DIAGNOSIS — Z7901 Long term (current) use of anticoagulants: Secondary | ICD-10-CM | POA: Insufficient documentation

## 2020-09-01 DIAGNOSIS — R0683 Snoring: Secondary | ICD-10-CM | POA: Insufficient documentation

## 2020-09-01 DIAGNOSIS — I4819 Other persistent atrial fibrillation: Secondary | ICD-10-CM | POA: Diagnosis not present

## 2020-09-01 DIAGNOSIS — Z8249 Family history of ischemic heart disease and other diseases of the circulatory system: Secondary | ICD-10-CM | POA: Insufficient documentation

## 2020-09-01 DIAGNOSIS — E669 Obesity, unspecified: Secondary | ICD-10-CM | POA: Diagnosis not present

## 2020-09-01 DIAGNOSIS — Z6841 Body Mass Index (BMI) 40.0 and over, adult: Secondary | ICD-10-CM | POA: Diagnosis not present

## 2020-09-01 DIAGNOSIS — Z79899 Other long term (current) drug therapy: Secondary | ICD-10-CM | POA: Insufficient documentation

## 2020-09-01 DIAGNOSIS — I10 Essential (primary) hypertension: Secondary | ICD-10-CM | POA: Insufficient documentation

## 2020-09-01 DIAGNOSIS — D6869 Other thrombophilia: Secondary | ICD-10-CM | POA: Diagnosis not present

## 2020-09-01 DIAGNOSIS — Z87891 Personal history of nicotine dependence: Secondary | ICD-10-CM | POA: Insufficient documentation

## 2020-09-01 DIAGNOSIS — E785 Hyperlipidemia, unspecified: Secondary | ICD-10-CM | POA: Insufficient documentation

## 2020-09-01 LAB — MAGNESIUM: Magnesium: 1.9 mg/dL (ref 1.7–2.4)

## 2020-09-01 MED ORDER — MAGNESIUM OXIDE 400 MG PO TABS: 400.0000 mg | ORAL_TABLET | Freq: Every day | ORAL | 1 refills | Status: DC

## 2020-09-01 MED ORDER — VALSARTAN 160 MG PO TABS: 160.0000 mg | ORAL_TABLET | Freq: Every day | ORAL | 3 refills | Status: DC

## 2020-09-01 NOTE — Telephone Encounter (Signed)
Medication list reviewed in anticipation of upcoming Tikosyn initiation. Patient is taking 2 QTc prolonging medications.   Tikosyn - Trazodone: Concurrent use of TRAZODONE and QT PROLONGING DRUGS may result in increased risk of QT-interval prolongation.  Tikosyn - Sertraline: Concurrent use of SERTRALINE and QT INTERVAL PROLONGING DRUGS may result in increased risk of QT-interval prolongation.  Recommend patient reach out to prescriber on tapering schedule for sertraline and consider alternatives.  Patient is anticoagulated on Eliquis on the appropriate dose. Please ensure that patient has not missed any anticoagulation doses in the 3 weeks prior to Tikosyn initiation.   Patient will need to be counseled to avoid use of Benadryl while on Tikosyn and in the 2-3 days prior to Tikosyn initiation.

## 2020-09-01 NOTE — Progress Notes (Signed)
Primary Care Physician: [name], MD Primary Cardiologist: Dr Percival Spanish Primary Electrophysiologist: none Referring Physician: Dr Lindell Noe is a 65 y.o. female with a history of CAD, HTN, HLD, atrial fibrillation who presents for consultation in the [facility].  The patient was initially diagnosed with atrial fibrillation 3/115/22 incidentally at her visit with [facility]. She is fairly asymptomatic with her arrhythmia but does have palpitations at times. Patient is on Eliquis for a CHADS2VASC score of 4. She underwent DCCV on 07/14/20 but her afib returned before follow up on 08/20/20. She does admit to snoring and daytime somnolence. She denies significant alcohol use.   Today, she denies symptoms of chest pain, shortness of breath, orthopnea, PND, lower extremity edema, dizziness, presyncope, syncope, snoring, daytime somnolence, bleeding, or neurologic sequela. The patient is tolerating medications without difficulties and is otherwise without complaint today.    Atrial Fibrillation Risk Factors:  she does have symptoms or diagnosis of sleep apnea. she does not have a history of rheumatic fever. she does not have a history of alcohol use. The patient does not have a history of early familial atrial fibrillation or other arrhythmias.  she has a BMI of Body mass index is 44.87 kg/m.Marland Kitchen Filed Weights   09/01/20 0958  Weight: 126.1 kg    Family History  Problem Relation Age of Onset   Heart attack Mother    Hypertension Mother    Heart disease Mother    Sudden death Mother    Obesity Mother    Hypertension Sister    Diabetes Brother    Birth defects Son        heart defect     Atrial Fibrillation Management history:  Previous antiarrhythmic drugs: none Previous cardioversions: 07/14/20 Previous ablations: none CHADS2VASC score: 4 Anticoagulation history: Eliquis   Past Medical History:   Diagnosis Date   Acute myocardial infarction    with a ruptured plaque in the circumflex in 2003   Anxiety    Bilateral swelling of feet    Cancer ([place])    basal cell face   Chest pain    Complication of anesthesia    states low O2 sats post-op 11/13, always slow to awaken   Degenerative joint disease    Dehiscence of amputation stump ([place])    left BKA   Depression    Dyspnea    with activity- car to house - steps   Fatty liver    Gallbladder problem    Heart disease    HTN (hypertension)    Hyperlipidemia LDL goal <70    Incarcerated hernia    Internal and external hemorrhoids without complication    Joint pain    Lymphedema    right side of body   Lymphedema of arm    right   Morbid obesity ([place])    Rheumatoid arthritis ([place])    Surgical wound, non healing ABDOMINAL    has wound vac @ 125 mm Hg   Ventral hernia    Past Surgical History:  Procedure Laterality Date   AMPUTATION Left 05/06/2017   Procedure: BELOW KNEE AMPUTATION;  Surgeon: Newt Minion, MD;  Location: [place];  Service: Orthopedics;  Laterality: Left;   APPLICATION OF WOUND VAC     BOWEL RESECTION  02/07/2012   Procedure: SMALL BOWEL RESECTION;  Surgeon: Gayland Curry, MD,FACS;  Location: [place];  Service: General;;   CARDIOVASCULAR STRESS TEST  01-17-2012  DR [facility]   LOW RISK NUCLEAR TEST   CARDIOVERSION N/A 07/14/2020   Procedure: CARDIOVERSION;  Surgeon: Arnoldo Lenis, MD;  Location: [facility];  Service: Endoscopy;  Laterality: N/A;   CESAREAN SECTION     x 4 in remote past   CHOLECYSTECTOMY     CORONARY ARTERY BYPASS GRAFT  2003   by Dr. Servando Snare. LIMA to the LAD, free RIMA to the circumflex. Stress perfusion study December 2009 with no high-risk areas of ischemia. She has a well-preserved ejection fraction   HYSTEROSCOPY WITH D & C N/A 05/14/2014   Procedure: DILATATION AND CURETTAGE (no specimen); HYSTEROSCOPY;  Surgeon: Jonnie Kind, MD;  Location: [facility];  Service: Gynecology;   Laterality: N/A;   I & D EXTREMITY Left 06/21/2017   Procedure: IRRIGATION AND DEBRIDEMENT OF LEFT LEG AMPUTATION SITE;  Surgeon: Wallace Going, DO;  Location: [facility];  Service: Plastics;  Laterality: Left;   INCISION AND DRAINAGE OF WOUND  04/17/2012   Procedure: IRRIGATION AND DEBRIDEMENT WOUND;  Surgeon: Theodoro Kos, DO;  Location: [place];  Service: Plastics;  Laterality: N/A;  OF ABDOMINAL WOUND, SURGICAL PREP AND PLACEMENT OF VAC, REMOVAL FOREHEAD SKIN LESION   INCISION AND DRAINAGE OF WOUND N/A 04/24/2012   Procedure: IRRIGATION AND DEBRIDEMENT WOUND;  Surgeon: Theodoro Kos, DO;  Location: [place];  Service: Plastics;  Laterality: N/A;  I & D ABDOMINAL WOUND WITH VAC AND ACELL   INCISION AND DRAINAGE OF WOUND N/A 05/01/2012   Procedure: IRRIGATION AND DEBRIDEMENT WOUND;  Surgeon: Theodoro Kos, DO;  Location: [place];  Service: Plastics;  Laterality: N/A;  WITH SURGICAL PREP AND PLACEMENT OF VAC   INCISION AND DRAINAGE OF WOUND N/A 05/08/2012   Procedure: IRRIGATION AND DEBRIDEMENT OF ABD WOUND SURGICAL PREP AND PLACEMENT OF VAC ;  Surgeon: Theodoro Kos, DO;  Location: [place];  Service: Plastics;  Laterality: N/A;  IRRIGATION AND DEBRIDEMENT OF ABD WOUND SURGICAL PREP AND PLACEMENT OF VAC    INCISION AND DRAINAGE OF WOUND N/A 05/15/2012   Procedure: IRRIGATION AND DEBRIDEMENT OF ABDOMINAL WOUND WITH POSSIBLE SURGICAL PREP AND PLACEMENT OF VAC;  Surgeon: Theodoro Kos, DO;  Location: [place];  Service: Plastics;  Laterality: N/A;   INCISION AND DRAINAGE OF WOUND N/A 05/22/2012   Procedure: IRRIGATION AND DEBRIDEMENT OF ABODOMINAL WOUND WITH  SURGICAL PREP AND VAC PLACEMENT;  Surgeon: Theodoro Kos, DO;  Location: [place];  Service: Plastics;  Laterality: N/A;   INCISION AND DRAINAGE OF WOUND N/A 06/28/2012   Procedure: IRRIGATION AND DEBRIDEMENT OF ABDOMINAL ULCER SURGICAL PREP AND  PLACEMENT OF ACELL AND VAC;  Surgeon: Theodoro Kos, DO;  Location: [place];  Service: Plastics;  Laterality: N/A;   INCISIONAL HERNIA REPAIR  02/07/2012   Procedure: HERNIA REPAIR INCISIONAL;  Surgeon: Gayland Curry, MD,FACS;  Location: [place];  Service: General;;  Open, Primary repair, strangulated Incisional hernia.   LESION REMOVAL  04/17/2012   Procedure: LESION REMOVAL;  Surgeon: Theodoro Kos, DO;  Location: [place];  Service: Plastics;  Laterality: N/A;   CENTER OF FOREHEAD, REMOVAL FORHEAD SKIN LESION   ORIF TOE FRACTURE Left 02/09/2017   Procedure: EXCISION TALAR HEAD, INTERNAL FIXATION MEDIAL COLUMN LEFT FOOT;  Surgeon: Newt Minion, MD;  Location: [place];  Service: Orthopedics;  Laterality: Left;   POLYPECTOMY N/A 05/14/2014   Procedure: ENDOMETRIAL POLYPECTOMY;  Surgeon: Jonnie Kind, MD;  [facility];  Service: Gynecology;  Laterality: N/A;   STUMP REVISION Left 08/31/2017   Procedure: REVISION LEFT BELOW KNEE AMPUTATION;  Surgeon: Newt Minion, MD;  Location: [place];  Service: Orthopedics;  Laterality: Left;    Current Outpatient Medications  Medication Sig Dispense Refill   apixaban (ELIQUIS) 5 MG TABS tablet Take 1 tablet (5 mg total) by mouth 2 (two) times daily. 60 tablet 6   atorvastatin (LIPITOR) 20 MG tablet Take 1 tablet (20 mg total) by mouth daily. 90 tablet 3   fenofibrate 160 MG tablet Take 1 tablet (160 mg total) by mouth daily. 90 tablet 3   fexofenadine (ALLEGRA) 180 MG tablet Take 1 tablet (180 mg total) by mouth daily. For allergy symptoms 90 tablet 3   fluticasone (FLONASE) 50 MCG/ACT nasal spray Place 1 spray into both nostrils daily as needed for allergies or rhinitis.     nitroGLYCERIN (NITROSTAT) 0.4 MG SL tablet Place 1 tablet (0.4 mg total) under the tongue every 5 (five) minutes x 3 doses as needed for chest pain. If pain persist after call 911 25 tablet 6   sertraline (ZOLOFT) 50 MG tablet Take 1 tablet (50 mg  total) by mouth daily. 90 tablet 1   traZODone (DESYREL) 150 MG tablet Take 1-2 tablets (150-300 mg total) by mouth at bedtime as needed for sleep. 180 tablet 3   valsartan-hydrochlorothiazide (DIOVAN-HCT) 160-12.5 MG tablet Take 1 tablet by mouth daily. 90 tablet 1   No current facility-administered medications for this encounter.    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 5 children. One of whom has pulmonary stenosis. Her only exercise is at work   Social Determinants of Radio broadcast assistant Strain: Low Risk    Difficulty of Paying Living Expenses: Not hard at all  Food Insecurity: Not on file  Transportation Needs: No Transportation Needs   Lack of Transportation (Medical): No   Lack of Transportation (Non-Medical): No  Physical Activity: Not on file  Stress: Not on file  Social Connections: Not on file  Intimate Partner Violence: Not on file     ROS- All systems are reviewed and negative except as per the HPI above.  Physical Exam: Vitals:   09/01/20 0958  BP: 118/66  Pulse: 97  Weight: 126.1 kg  Height: 5\' 6"  (1.676 m)    GEN- The patient is a well appearing obese female,  alert and oriented x 3 today.   Head- normocephalic, atraumatic Eyes-  Sclera clear, conjunctiva pink Ears- hearing  intact Oropharynx- clear Neck- supple  Lungs- Clear to ausculation bilaterally, normal work of breathing Heart- irregular rate and rhythm, no murmurs, rubs or gallops  GI- soft, NT, ND, + BS Extremities- no clubbing, cyanosis, or edema, left BKA MS- no significant deformity or atrophy Skin- no rash or lesion Psych- euthymic mood, full affect Neuro- strength and sensation are intact  Wt Readings from Last 3 Encounters:  09/01/20 126.1 kg  08/20/20 126.6 kg  08/19/20 126.2 kg    EKG today demonstrates  Afib, PVC Vent. rate 97 BPM PR interval * ms QRS duration 90 ms QT/QTcB 342/434 ms  Echo 06/18/20 demonstrated  1. Left ventricular ejection fraction, by estimation, is 50 to 55%. The  left ventricle has normal function. The left ventricle has no regional  wall motion abnormalities. Left ventricular diastolic parameters are  indeterminate.   2. Right ventricular systolic function is normal. The right ventricular  size is normal. There is normal pulmonary artery systolic pressure.   3. Left atrial size was mildly dilated.   4. The mitral valve is normal in structure. Mild mitral valve  regurgitation. No evidence of mitral stenosis.   5. The aortic valve has an indeterminant number of cusps. There is mild  calcification of the aortic valve. There is mild thickening of the aortic  valve. Aortic valve regurgitation is mild. No aortic stenosis is present.   6. The inferior vena cava is normal in size with greater than 50%  respiratory variability, suggesting right atrial pressure of 3 mmHg.   Epic records are reviewed at length today  CHA2DS2-VASc Score = 4  The patient's score is based upon: CHF History: No HTN History: Yes Diabetes History: No Stroke History: No Vascular Disease History: Yes Age Score: 1 Gender Score: 1     ASSESSMENT AND PLAN: 1.  Persistent Atrial Fibrillation (ICD10:  I48.19) The patient's CHA2DS2-VASc score is 4, indicating a 4.8% annual risk of stroke.   S/p DCCV on 07/14/20 with quick return of her afib. We discussed therapeutic options today. Patient wants to pursue dofetilide admission.  Patient to check on price of dofetilide. Continue Eliquis 5 mg BID No recent benadryl use PharmD to screen medications. Her valsartan-HCTZ will be stopped 3 days prior to admission in favor of valsartan alone. She will reach out to PCP regarding alternatives to trazodone.  QTc in SR 443-458 ms Check mag today. Recent bmet reviewed.  2. Secondary Hypercoagulable State (ICD10:  D68.69) The patient is at significant risk for stroke/thromboembolism based upon her CHA2DS2-VASc Score of 4.  Continue Apixaban (Eliquis).   3. Obesity Body mass index is 44.87 kg/m. Lifestyle modification was discussed at length including regular exercise and weight reduction. [facility].   4. Snoring/daytime somnolence  The importance of adequate treatment of sleep apnea was discussed today in order to improve our ability to maintain sinus rhythm long term. Will refer for sleep study.   5. CAD No anginal symptoms.   Follow up for dofetilide admission 09/09/20.   [facility] [address] [place], [place] [address] [phone] 09/01/2020 10:13 AM

## 2020-09-01 NOTE — Patient Instructions (Signed)
On Friday - Change Valsartan/HCTZ to Valsartan 160mg  once a day  Let me know what primary changes trazadone too.

## 2020-09-01 NOTE — Telephone Encounter (Signed)
Patient to discuss with PCP regarding trazodone. Per Adline Peals PA ok to stay on low dose zoloft.

## 2020-09-02 ENCOUNTER — Telehealth: Payer: Self-pay | Admitting: *Deleted

## 2020-09-02 NOTE — Telephone Encounter (Signed)
-----   Message from Juluis Mire, RN sent at 09/01/2020 11:00 AM EDT ----- Regarding: sleep study Pt needs sleep study for snoring and daytime somnolence per clint fenton. Thanks Nurse, adult

## 2020-09-02 NOTE — Telephone Encounter (Signed)
Best to avoid others at this time. Give the Tylenol PM a try. It works well for many people.

## 2020-09-02 NOTE — Telephone Encounter (Signed)
Patient notified of sleep study appointment. Prior Authorization for split night sleep study sent to [facility] via web portal. No PA is required. Tracking Number [id number].

## 2020-09-03 ENCOUNTER — Encounter: Payer: Self-pay | Admitting: Family Medicine

## 2020-09-03 ENCOUNTER — Telehealth: Payer: Self-pay | Admitting: Family Medicine

## 2020-09-03 NOTE — Telephone Encounter (Signed)
A fib clinic is recommending a controlled substance. That requires an office visit.

## 2020-09-03 NOTE — Telephone Encounter (Signed)
This was addressed through a patient advice request.

## 2020-09-05 ENCOUNTER — Other Ambulatory Visit (HOSPITAL_COMMUNITY)
Admission: RE | Admit: 2020-09-05 | Discharge: 2020-09-05 | Disposition: A | Discharge status: Home / Self Care | Payer: Medicare Other | Source: Ambulatory Visit | Attending: Physician Assistant | Admitting: Physician Assistant

## 2020-09-05 DIAGNOSIS — Z20822 Contact with and (suspected) exposure to covid-19: Secondary | ICD-10-CM | POA: Diagnosis not present

## 2020-09-05 DIAGNOSIS — Z01812 Encounter for preprocedural laboratory examination: Secondary | ICD-10-CM | POA: Insufficient documentation

## 2020-09-05 LAB — SARS CORONAVIRUS 2 (TAT 6-24 HRS): SARS Coronavirus 2: NEGATIVE

## 2020-09-05 NOTE — Telephone Encounter (Signed)
A Fib clinic's recommendations for her were all controlled. Spoke with patient regarding needing office visit for controlled medication. Patient voices understanding. She states she will do without for now. Patient voices being really burnout on multiple doctor visits. Advised patient to let us know if she changes her mind, we are happy to take care of this for her.

## 2020-09-09 ENCOUNTER — Other Ambulatory Visit: Payer: Self-pay

## 2020-09-09 ENCOUNTER — Encounter (HOSPITAL_COMMUNITY): Payer: Self-pay | Admitting: Physician Assistant

## 2020-09-09 ENCOUNTER — Ambulatory Visit (HOSPITAL_COMMUNITY)
Admission: RE | Admit: 2020-09-09 | Discharge: 2020-09-09 | Disposition: A | Discharge status: Home / Self Care | Payer: Medicare Other | Source: Ambulatory Visit | Attending: Physician Assistant | Admitting: Physician Assistant

## 2020-09-09 ENCOUNTER — Encounter (HOSPITAL_COMMUNITY): Payer: Self-pay | Admitting: Internal Medicine

## 2020-09-09 ENCOUNTER — Inpatient Hospital Stay (HOSPITAL_COMMUNITY)
Admission: RE | Admit: 2020-09-09 | Discharge: 2020-09-14 | DRG: 309 | Disposition: A | Discharge status: Home / Self Care | Payer: Medicare Other | Attending: Internal Medicine | Admitting: Internal Medicine

## 2020-09-09 VITALS — BP 134/80 | HR 104 | Ht 66.0 in | Wt 276.2 lb

## 2020-09-09 DIAGNOSIS — D6869 Other thrombophilia: Secondary | ICD-10-CM | POA: Diagnosis not present

## 2020-09-09 DIAGNOSIS — Z23 Encounter for immunization: Secondary | ICD-10-CM

## 2020-09-09 DIAGNOSIS — E785 Hyperlipidemia, unspecified: Secondary | ICD-10-CM | POA: Diagnosis not present

## 2020-09-09 DIAGNOSIS — I493 Ventricular premature depolarization: Secondary | ICD-10-CM | POA: Diagnosis present

## 2020-09-09 DIAGNOSIS — R0683 Snoring: Secondary | ICD-10-CM | POA: Diagnosis present

## 2020-09-09 DIAGNOSIS — M069 Rheumatoid arthritis, unspecified: Secondary | ICD-10-CM | POA: Diagnosis present

## 2020-09-09 DIAGNOSIS — W06XXXA Fall from bed, initial encounter: Secondary | ICD-10-CM | POA: Diagnosis not present

## 2020-09-09 DIAGNOSIS — I10 Essential (primary) hypertension: Secondary | ICD-10-CM | POA: Diagnosis present

## 2020-09-09 DIAGNOSIS — I2510 Atherosclerotic heart disease of native coronary artery without angina pectoris: Secondary | ICD-10-CM | POA: Diagnosis present

## 2020-09-09 DIAGNOSIS — Z951 Presence of aortocoronary bypass graft: Secondary | ICD-10-CM | POA: Diagnosis not present

## 2020-09-09 DIAGNOSIS — R400 Somnolence: Secondary | ICD-10-CM | POA: Diagnosis not present

## 2020-09-09 DIAGNOSIS — Z7901 Long term (current) use of anticoagulants: Secondary | ICD-10-CM

## 2020-09-09 DIAGNOSIS — Z9049 Acquired absence of other specified parts of digestive tract: Secondary | ICD-10-CM | POA: Diagnosis not present

## 2020-09-09 DIAGNOSIS — Z833 Family history of diabetes mellitus: Secondary | ICD-10-CM

## 2020-09-09 DIAGNOSIS — Z87891 Personal history of nicotine dependence: Secondary | ICD-10-CM

## 2020-09-09 DIAGNOSIS — Z888 Allergy status to other drugs, medicaments and biological substances status: Secondary | ICD-10-CM

## 2020-09-09 DIAGNOSIS — Z89512 Acquired absence of left leg below knee: Secondary | ICD-10-CM

## 2020-09-09 DIAGNOSIS — Y92239 Unspecified place in hospital as the place of occurrence of the external cause: Secondary | ICD-10-CM | POA: Diagnosis not present

## 2020-09-09 DIAGNOSIS — I4891 Unspecified atrial fibrillation: Secondary | ICD-10-CM | POA: Diagnosis present

## 2020-09-09 DIAGNOSIS — I252 Old myocardial infarction: Secondary | ICD-10-CM

## 2020-09-09 DIAGNOSIS — I4819 Other persistent atrial fibrillation: Secondary | ICD-10-CM | POA: Diagnosis not present

## 2020-09-09 DIAGNOSIS — Z8249 Family history of ischemic heart disease and other diseases of the circulatory system: Secondary | ICD-10-CM

## 2020-09-09 DIAGNOSIS — K760 Fatty (change of) liver, not elsewhere classified: Secondary | ICD-10-CM | POA: Diagnosis present

## 2020-09-09 DIAGNOSIS — Z6841 Body Mass Index (BMI) 40.0 and over, adult: Secondary | ICD-10-CM | POA: Diagnosis not present

## 2020-09-09 DIAGNOSIS — Z79899 Other long term (current) drug therapy: Secondary | ICD-10-CM

## 2020-09-09 DIAGNOSIS — N179 Acute kidney failure, unspecified: Secondary | ICD-10-CM | POA: Diagnosis not present

## 2020-09-09 LAB — BASIC METABOLIC PANEL
Anion gap: 13 (ref 5–15)
BUN: 14 mg/dL (ref 8–23)
CO2: 21 mmol/L — ABNORMAL LOW (ref 22–32)
Calcium: 9.2 mg/dL (ref 8.9–10.3)
Chloride: 103 mmol/L (ref 98–111)
Creatinine, Ser: 0.92 mg/dL (ref 0.44–1.00)
GFR, Estimated: >60 mL/min (ref >60)
Glucose, Bld: 95 mg/dL (ref 70–99)
Potassium: 4 mmol/L (ref 3.5–5.1)
Sodium: 137 mmol/L (ref 135–145)

## 2020-09-09 LAB — HIV ANTIBODY (ROUTINE TESTING W REFLEX): HIV Screen 4th Generation wRfx: NONREACTIVE

## 2020-09-09 LAB — MAGNESIUM: Magnesium: 2 mg/dL (ref 1.7–2.4)

## 2020-09-09 MED ORDER — MAGNESIUM OXIDE -MG SUPPLEMENT 400 (240 MG) MG PO TABS
400.0000 mg | ORAL_TABLET | Freq: Every day | ORAL | Status: DC
  Administered 2020-09-10 – 2020-09-14 (×5): 400 mg via ORAL
  Filled 2020-09-09 (×6): qty 1

## 2020-09-09 MED ORDER — NITROGLYCERIN 0.4 MG SL SUBL: 0.4000 mg | SUBLINGUAL_TABLET | SUBLINGUAL | Status: DC | PRN

## 2020-09-09 MED ORDER — IRBESARTAN 150 MG PO TABS
150.0000 mg | ORAL_TABLET | Freq: Every day | ORAL | Status: DC
  Administered 2020-09-10 – 2020-09-14 (×5): 150 mg via ORAL
  Filled 2020-09-09 (×5): qty 1

## 2020-09-09 MED ORDER — PNEUMOCOCCAL VAC POLYVALENT 25 MCG/0.5ML IJ INJ
0.5000 mL | INJECTION | INTRAMUSCULAR | Status: AC
  Administered 2020-09-10: 0.5 mL via INTRAMUSCULAR
  Filled 2020-09-09: qty 0.5

## 2020-09-09 MED ORDER — FLUTICASONE PROPIONATE 50 MCG/ACT NA SUSP
1.0000 | Freq: Every day | NASAL | Status: DC | PRN
  Filled 2020-09-09: qty 16

## 2020-09-09 MED ORDER — SODIUM CHLORIDE 0.9 % IV SOLN: 250.0000 mL | INTRAVENOUS | Status: DC | PRN

## 2020-09-09 MED ORDER — MAGNESIUM SULFATE 2 GM/50ML IV SOLN
2.0000 g | Freq: Once | INTRAVENOUS | Status: AC
  Administered 2020-09-09: 2 g via INTRAVENOUS
  Filled 2020-09-09: qty 50

## 2020-09-09 MED ORDER — DOFETILIDE 500 MCG PO CAPS
500.0000 ug | ORAL_CAPSULE | Freq: Two times a day (BID) | ORAL | Status: DC
  Administered 2020-09-09 – 2020-09-12 (×6): 500 ug via ORAL
  Filled 2020-09-09 (×6): qty 1

## 2020-09-09 MED ORDER — MAGNESIUM OXIDE 400 MG PO TABS: 400.0000 mg | ORAL_TABLET | Freq: Every day | ORAL | Status: DC

## 2020-09-09 MED ORDER — SERTRALINE HCL 50 MG PO TABS
50.0000 mg | ORAL_TABLET | Freq: Every day | ORAL | Status: DC
  Administered 2020-09-10 – 2020-09-14 (×5): 50 mg via ORAL
  Filled 2020-09-09 (×5): qty 1

## 2020-09-09 MED ORDER — FENOFIBRATE 160 MG PO TABS
160.0000 mg | ORAL_TABLET | Freq: Every day | ORAL | Status: DC
  Administered 2020-09-10 – 2020-09-14 (×5): 160 mg via ORAL
  Filled 2020-09-09 (×5): qty 1

## 2020-09-09 MED ORDER — ATORVASTATIN CALCIUM 10 MG PO TABS
20.0000 mg | ORAL_TABLET | Freq: Every day | ORAL | Status: DC
  Administered 2020-09-10 – 2020-09-14 (×5): 20 mg via ORAL
  Filled 2020-09-09 (×5): qty 2

## 2020-09-09 MED ORDER — APIXABAN 5 MG PO TABS
5.0000 mg | ORAL_TABLET | Freq: Two times a day (BID) | ORAL | Status: DC
  Administered 2020-09-09 – 2020-09-14 (×10): 5 mg via ORAL
  Filled 2020-09-09 (×10): qty 1

## 2020-09-09 MED ORDER — LORATADINE 10 MG PO TABS: 10.0000 mg | ORAL_TABLET | Freq: Every day | ORAL | Status: DC

## 2020-09-09 MED ORDER — SODIUM CHLORIDE 0.9% FLUSH: 3.0000 mL | INTRAVENOUS | Status: DC | PRN

## 2020-09-09 MED ORDER — SODIUM CHLORIDE 0.9% FLUSH
3.0000 mL | Freq: Two times a day (BID) | INTRAVENOUS | Status: DC
  Administered 2020-09-09 – 2020-09-14 (×9): 3 mL via INTRAVENOUS

## 2020-09-09 NOTE — Progress Notes (Signed)
Primary Care Physician: [name], MD Primary Cardiologist: Dr Percival Spanish Primary Electrophysiologist: none Referring Physician: Dr Lindell Noe is a 65 y.o. female with a history of CAD, HTN, HLD, atrial fibrillation who presents for follow up in the [facility].  The patient was initially diagnosed with atrial fibrillation 3/115/22 incidentally at her visit with [facility]. She is fairly asymptomatic with her arrhythmia but does have palpitations at times. Patient is on Eliquis for a CHADS2VASC score of 4. She underwent DCCV on 07/14/20 but her afib returned before follow up on 08/20/20. She does admit to snoring and daytime somnolence. She denies significant alcohol use.   On follow up today, patient presents for dofetilide admission. She has stopped HCTZ and trazodone. She denies any missed doses of anticoagulation in the last 3 weeks.   Today, she denies symptoms of chest pain, shortness of breath, orthopnea, PND, lower extremity edema, dizziness, presyncope, syncope, bleeding, or neurologic sequela. The patient is tolerating medications without difficulties and is otherwise without complaint today.    Atrial Fibrillation Risk Factors:  she does have symptoms or diagnosis of sleep apnea. she does not have a history of rheumatic fever. she does not have a history of alcohol use. The patient does not have a history of early familial atrial fibrillation or other arrhythmias.  she has a BMI of Body mass index is 44.58 kg/m.Marland Kitchen Filed Weights   09/09/20 1137  Weight: 125.3 kg     Family History  Problem Relation Age of Onset   Heart attack Mother    Hypertension Mother    Heart disease Mother    Sudden death Mother    Obesity Mother    Hypertension Sister    Diabetes Brother    Birth defects Son        heart defect     Atrial Fibrillation Management history:  Previous antiarrhythmic drugs:  none Previous cardioversions: 07/14/20 Previous ablations: none CHADS2VASC score: 4 Anticoagulation history: Eliquis   Past Medical History:  Diagnosis Date   Acute myocardial infarction    with a ruptured plaque in the circumflex in 2003   Anxiety    Bilateral swelling of feet    Cancer ([place])    basal cell face   Chest pain    Complication of anesthesia    states low O2 sats post-op 11/13, always slow to awaken   Degenerative joint disease    Dehiscence of amputation stump ([place])    left BKA   Depression    Dyspnea    with activity- car to house - steps   Fatty liver    Gallbladder problem    Heart disease    HTN (hypertension)    Hyperlipidemia LDL goal <70    Incarcerated hernia    Internal and external hemorrhoids without complication    Joint pain    Lymphedema    right side of body   Lymphedema of arm    right   Morbid obesity ([place])    Rheumatoid arthritis ([place])    Surgical wound, non healing ABDOMINAL    has wound vac @ 125 mm Hg   Ventral hernia    Past Surgical History:  Procedure Laterality Date   AMPUTATION Left 05/06/2017   Procedure: BELOW KNEE AMPUTATION;  Surgeon: Newt Minion, MD;  Location: [place];  Service: Orthopedics;  Laterality: Left;   APPLICATION OF WOUND VAC     BOWEL RESECTION  02/07/2012   Procedure: SMALL BOWEL RESECTION;  Surgeon: Gayland Curry, MD,FACS;  Location: [place];  Service: General;;   CARDIOVASCULAR STRESS TEST  01-17-2012  DR HOCHREIN   LOW RISK NUCLEAR TEST   CARDIOVERSION N/A 07/14/2020   Procedure: CARDIOVERSION;  Surgeon: Arnoldo Lenis, MD;  Location: [facility];  Service: Endoscopy;  Laterality: N/A;   CESAREAN SECTION     x 4 in remote past   CHOLECYSTECTOMY     CORONARY ARTERY BYPASS GRAFT  2003   by Dr. Servando Snare. LIMA to the LAD, free RIMA to the circumflex. Stress perfusion study December 2009 with no high-risk areas of ischemia. She has a well-preserved ejection fraction   HYSTEROSCOPY WITH D & C N/A 05/14/2014    Procedure: DILATATION AND CURETTAGE (no specimen); HYSTEROSCOPY;  Surgeon: Jonnie Kind, MD;  Location: [facility];  Service: Gynecology;  Laterality: N/A;   I & D EXTREMITY Left 06/21/2017   Procedure: IRRIGATION AND DEBRIDEMENT OF LEFT LEG AMPUTATION SITE;  Surgeon: Wallace Going, DO;  Location: [facility];  Service: Plastics;  Laterality: Left;   INCISION AND DRAINAGE OF WOUND  04/17/2012   Procedure: IRRIGATION AND DEBRIDEMENT WOUND;  Surgeon: Theodoro Kos, DO;  Location: [place];  Service: Plastics;  Laterality: N/A;  OF ABDOMINAL WOUND, SURGICAL PREP AND PLACEMENT OF VAC, REMOVAL FOREHEAD SKIN LESION   INCISION AND DRAINAGE OF WOUND N/A 04/24/2012   Procedure: IRRIGATION AND DEBRIDEMENT WOUND;  Surgeon: Theodoro Kos, DO;  Location: [place];  Service: Plastics;  Laterality: N/A;  I & D ABDOMINAL WOUND WITH VAC AND ACELL   INCISION AND DRAINAGE OF WOUND N/A 05/01/2012   Procedure: IRRIGATION AND DEBRIDEMENT WOUND;  Surgeon: Theodoro Kos, DO;  Location: [place];  Service: Plastics;  Laterality: N/A;  WITH SURGICAL PREP AND PLACEMENT OF VAC   INCISION AND DRAINAGE OF WOUND N/A 05/08/2012   Procedure: IRRIGATION AND DEBRIDEMENT OF ABD WOUND SURGICAL PREP AND PLACEMENT OF VAC ;  Surgeon: Theodoro Kos, DO;  Location: [place];  Service: Plastics;  Laterality: N/A;  IRRIGATION AND DEBRIDEMENT OF ABD WOUND SURGICAL PREP AND PLACEMENT OF VAC    INCISION AND DRAINAGE OF WOUND N/A 05/15/2012   Procedure: IRRIGATION AND DEBRIDEMENT OF ABDOMINAL WOUND WITH POSSIBLE SURGICAL PREP AND PLACEMENT OF VAC;  Surgeon: Theodoro Kos, DO;  Location: [place];  Service: Plastics;  Laterality: N/A;   INCISION AND DRAINAGE OF WOUND N/A 05/22/2012   Procedure: IRRIGATION AND DEBRIDEMENT OF ABODOMINAL WOUND WITH  SURGICAL PREP AND VAC PLACEMENT;  Surgeon: Theodoro Kos, DO;  Location: [place];  Service: Plastics;   Laterality: N/A;   INCISION AND DRAINAGE OF WOUND N/A 06/28/2012   Procedure: IRRIGATION AND DEBRIDEMENT OF ABDOMINAL ULCER SURGICAL PREP AND PLACEMENT OF ACELL AND VAC;  Surgeon: Theodoro Kos, DO;  Location: [place];  Service: Plastics;  Laterality: N/A;   INCISIONAL HERNIA REPAIR  02/07/2012   Procedure: HERNIA REPAIR INCISIONAL;  Surgeon: Gayland Curry, MD,FACS;  Location: [place];  Service: General;;  Open, Primary repair, strangulated Incisional hernia.   LESION REMOVAL  04/17/2012   Procedure: LESION REMOVAL;  Surgeon: Theodoro Kos, DO;  Location: [place];  Service: Plastics;  Laterality: N/A;   CENTER OF FOREHEAD, REMOVAL FORHEAD SKIN LESION   ORIF TOE FRACTURE Left 02/09/2017   Procedure: EXCISION TALAR HEAD, INTERNAL FIXATION MEDIAL COLUMN LEFT FOOT;  Surgeon: Newt Minion, MD;  Location: [place];  Service: Orthopedics;  Laterality: Left;   POLYPECTOMY N/A 05/14/2014   Procedure: ENDOMETRIAL POLYPECTOMY;  Surgeon: Jonnie Kind, MD;  Location: [facility];  Service: Gynecology;  Laterality: N/A;   STUMP REVISION Left 08/31/2017   Procedure: REVISION LEFT BELOW KNEE AMPUTATION;  Surgeon: Newt Minion, MD;  Location: [place];  Service: Orthopedics;  Laterality: Left;    Current Outpatient Medications  Medication Sig Dispense Refill   apixaban (ELIQUIS) 5 MG TABS tablet Take 1 tablet (5 mg total) by mouth 2 (two) times daily. 60 tablet 6   atorvastatin (LIPITOR) 20 MG tablet Take 1 tablet (20 mg total) by mouth daily. 90 tablet 3   fenofibrate 160 MG tablet Take 1 tablet (160 mg total) by mouth daily. 90 tablet 3   fexofenadine (ALLEGRA) 180 MG tablet Take 1 tablet (180 mg total) by mouth daily. For allergy symptoms 90 tablet 3   fluticasone (FLONASE) 50 MCG/ACT nasal spray Place 1 spray into both nostrils daily as needed for allergies or rhinitis.     magnesium oxide (MAG-OX) 400 MG tablet Take 1 tablet (400 mg total) by mouth daily. 30 tablet 1    nitroGLYCERIN (NITROSTAT) 0.4 MG SL tablet Place 1 tablet (0.4 mg total) under the tongue every 5 (five) minutes x 3 doses as needed for chest pain. If pain persist after call 911 25 tablet 6   sertraline (ZOLOFT) 50 MG tablet Take 1 tablet (50 mg total) by mouth daily. 90 tablet 1   valsartan (DIOVAN) 160 MG tablet Take 1 tablet (160 mg total) by mouth daily. 30 tablet 3   No current facility-administered medications for this encounter.    Allergies  Allergen Reactions   Ace Inhibitors Cough    Social History   Socioeconomic History   Marital status: Married    Spouse name: Not on file   Number of children: 5   Years of education: Not on file   Highest education level: Not on file  Occupational History   Occupation: Estate manager/land agent    Comment: In the Risk [employer]   Occupation: retired  Tobacco Use   Smoking status: Former    Packs/day: 1.00    Years: 25.00    Pack years: 25.00    Types: Cigarettes    Quit date: 11/17/2011    Years since quitting: 8.8   Smokeless tobacco: Never  Vaping Use   Vaping Use: Never used  Substance and Sexual Activity   Alcohol use: No   Drug use: No   Sexual activity: Yes    Birth control/protection: Post-menopausal  Other Topics Concern   Not on file  Social History Narrative   Lives in [place] with her husband. 5 children. One of whom has pulmonary stenosis. Her only exercise is at work   Social Determinants of Radio broadcast assistant Strain: Low Risk    Difficulty of Paying Living Expenses: Not hard at all  Food Insecurity: Not on file  Transportation Needs: No Transportation Needs   Lack of Transportation (Medical): No   Lack of Transportation (Non-Medical): No  Physical Activity: Not on file  Stress: Not on file  Social Connections: Not on file  Intimate Partner Violence: Not on file     ROS- All systems are reviewed and negative except as per the HPI above.  Physical Exam: Vitals:   09/09/20 1137  BP: 134/80  Pulse:  (!) 104  Weight: 125.3 kg  Height: 5\' 6"  (1.676 m)  GEN- The patient is a well appearing obese female, alert and oriented x 3 today.   HEENT-head normocephalic, atraumatic, sclera clear, conjunctiva pink, hearing intact, trachea midline. Lungs- Clear to ausculation bilaterally, normal work of breathing Heart- irregular rate and rhythm, no murmurs, rubs or gallops  GI- soft, NT, ND, + BS Extremities- no clubbing, cyanosis, or edema, left BKA MS- no significant deformity or atrophy Skin- no rash or lesion Psych- euthymic mood, full affect Neuro- strength and sensation are intact   Wt Readings from Last 3 Encounters:  09/09/20 125.3 kg  09/01/20 126.1 kg  08/20/20 126.6 kg    EKG today demonstrates  Afib, PVCs Vent. rate 104 BPM PR interval * ms QRS duration 88 ms QT/QTcB 322/423 ms  Echo 06/18/20 demonstrated  1. Left ventricular ejection fraction, by estimation, is 50 to 55%. The  left ventricle has normal function. The left ventricle has no regional  wall motion abnormalities. Left ventricular diastolic parameters are  indeterminate.   2. Right ventricular systolic function is normal. The right ventricular  size is normal. There is normal pulmonary artery systolic pressure.   3. Left atrial size was mildly dilated.   4. The mitral valve is normal in structure. Mild mitral valve  regurgitation. No evidence of mitral stenosis.   5. The aortic valve has an indeterminant number of cusps. There is mild  calcification of the aortic valve. There is mild thickening of the aortic  valve. Aortic valve regurgitation is mild. No aortic stenosis is present.   6. The inferior vena cava is normal in size with greater than 50%  respiratory variability, suggesting right atrial pressure of 3 mmHg.   Epic records are reviewed at length today  CHA2DS2-VASc Score = 4  The patient's score is based upon: CHF History: No HTN History: Yes Diabetes History: No Stroke History: No Vascular  Disease History: Yes Age Score: 1 Gender Score: 1     ASSESSMENT AND PLAN: 1. Persistent Atrial Fibrillation (ICD10:  I48.19) The patient's CHA2DS2-VASc score is 4, indicating a 4.8% annual risk of stroke.   S/p DCCV on 07/14/20 with quick return of her afib. Patient presents for dofetilide admission. Patient aware of price of dofetilide. Continue Eliquis 5 mg BID, states no missed doses in the last 3 weeks. No recent benadryl use PharmD has screened medications. She has stopped HCTZ and trazodone.  QTc in SR 443-458 ms Labs today show creatinine at 0.92, K+ 4.0 and mag 2.0, CrCl calculated at 117 mL/min  2. Secondary Hypercoagulable State (ICD10:  D68.69) The patient is at significant risk for stroke/thromboembolism based upon her CHA2DS2-VASc Score of 4.  Continue Apixaban (Eliquis).   3. Obesity Body mass index is 44.58 kg/m. Lifestyle modification was discussed and encouraged including regular physical activity and weight reduction. [facility].   4. Snoring/daytime somnolence  Sleep study scheduled 11/06/20  5. CAD No anginal symptoms.   To be admitted later today once a bed becomes available.    [facility] [address] [place], [place] [address] [phone] 09/09/2020 11:48 AM

## 2020-09-09 NOTE — Progress Notes (Signed)
   09/09/20 1257  Assess: MEWS Score  Temp 97.6 F (36.4 C)  BP (!) 147/86  Pulse Rate (!) 59  ECG Heart Rate (!) 113  Resp 18  Level of Consciousness Alert  SpO2 94 %  O2 Device Room Air  Assess: MEWS Score  MEWS Temp 0  MEWS Systolic 0  MEWS Pulse 2  MEWS RR 0  MEWS LOC 0  MEWS Score 2  MEWS Score Color Yellow  Assess: if the MEWS score is Yellow or Red  Were vital signs taken at a resting state? Yes  Focused Assessment No change from prior assessment  Early Detection of Sepsis Score *See Row Information* Low  MEWS guidelines implemented *See Row Information* Yes  Treat  MEWS Interventions Other (Comment) (Admitted d/t Afib)  Pain Scale 0-10  Pain Score 0  Take Vital Signs  Increase Vital Sign Frequency  Yellow: Q 2hr X 2 then Q 4hr X 2, if remains yellow, continue Q 4hrs  Escalate  MEWS: Escalate Yellow: discuss with charge nurse/RN and consider discussing with provider and RRT  Notify: Charge Nurse/RN  Name of Charge Nurse/RN Notified Christy, RN  Date Charge Nurse/RN Notified 09/09/20  Time Charge Nurse/RN Notified 1311  Document  Patient Outcome Other (Comment) (Admitted d/t afib)  Progress note created (see row info) Yes

## 2020-09-09 NOTE — H&P (Addendum)
Electrophysiology H&P  Note    Primary Care Physician: [name], MD Primary Cardiologist: Dr Percival Spanish Primary Electrophysiologist: New to Dr. Rayann Heman Referring Physician: Dr Lindell Noe is a 65 y.o. female with a history of CAD, HTN, HLD, atrial fibrillation who presents for follow up in the [facility].  The patient was initially diagnosed with atrial fibrillation 3/115/22 incidentally at her visit with the [facility]. She is fairly asymptomatic with her arrhythmia but does have palpitations at times. Patient is on Eliquis for a CHADS2VASC score of 4. She underwent DCCV on 07/14/20 but her afib returned before follow up on 08/20/20. She does admit to snoring and daytime somnolence. She denies significant alcohol use.   She presents today to the AF clinic for dofetilide admission. She has stopped HCTZ and trazodone. She denies any missed doses of anticoagulation in the last 3 weeks.   Today, she denies symptoms of chest pain, shortness of breath, orthopnea, PND, lower extremity edema, dizziness, presyncope, syncope, bleeding, or neurologic sequela. The patient is tolerating medications without difficulties and is otherwise without complaint today.    Atrial Fibrillation Risk Factors:  she does have symptoms or diagnosis of sleep apnea. she does not have a history of rheumatic fever. she does not have a history of alcohol use. The patient does not have a history of early familial atrial fibrillation or other arrhythmias.  she has a BMI of Body mass index is 45 kg/m.Marland Kitchen Filed Weights   09/09/20 1300  Weight: 126.5 kg     Family History  Problem Relation Age of Onset   Heart attack Mother    Hypertension Mother    Heart disease Mother    Sudden death Mother    Obesity Mother    Hypertension Sister    Diabetes Brother    Birth defects Son        heart defect     Atrial Fibrillation Management  history:  Previous antiarrhythmic drugs: none Previous cardioversions: 07/14/20 Previous ablations: none CHADS2VASC score: 4 Anticoagulation history: Eliquis   Past Medical History:  Diagnosis Date   Acute myocardial infarction    with a ruptured plaque in the circumflex in 2003   Anxiety    Bilateral swelling of feet    Cancer ([place])    basal cell face   Chest pain    Complication of anesthesia    states low O2 sats post-op 11/13, always slow to awaken   Degenerative joint disease    Dehiscence of amputation stump ([place])    left BKA   Depression    Dyspnea    with activity- car to house - steps   Fatty liver    Gallbladder problem    Heart disease    HTN (hypertension)    Hyperlipidemia LDL goal <70    Incarcerated hernia    Internal and external hemorrhoids without complication    Joint pain    Lymphedema    right side of body   Lymphedema of arm    right   Morbid obesity ([place])    Rheumatoid arthritis ([place])    Surgical wound, non healing ABDOMINAL    has wound vac @ 125 mm Hg   Ventral hernia    Past Surgical History:  Procedure Laterality Date   AMPUTATION Left 05/06/2017   Procedure: BELOW KNEE AMPUTATION;  Surgeon: Newt Minion, MD;  Location: [facility];  Service: Orthopedics;  Laterality: Left;   APPLICATION  OF WOUND VAC     BOWEL RESECTION  02/07/2012   Procedure: SMALL BOWEL RESECTION;  Surgeon: Gayland Curry, MD,FACS;  Location: [facility];  Service: General;;   CARDIOVASCULAR STRESS TEST  01-17-2012  DR HOCHREIN   LOW RISK NUCLEAR TEST   CARDIOVERSION N/A 07/14/2020   Procedure: CARDIOVERSION;  Surgeon: Arnoldo Lenis, MD;  Location: [facility];  Service: Endoscopy;  Laterality: N/A;   CESAREAN SECTION     x 4 in remote past   CHOLECYSTECTOMY     CORONARY ARTERY BYPASS GRAFT  2003   by Dr. Servando Snare. LIMA to the LAD, free RIMA to the circumflex. Stress perfusion study December 2009 with no high-risk areas of ischemia. She has a well-preserved ejection fraction    HYSTEROSCOPY WITH D & C N/A 05/14/2014   Procedure: DILATATION AND CURETTAGE (no specimen); HYSTEROSCOPY;  Surgeon: Jonnie Kind, MD;  Location: [facility];  Service: Gynecology;  Laterality: N/A;   I & D EXTREMITY Left 06/21/2017   Procedure: IRRIGATION AND DEBRIDEMENT OF LEFT LEG AMPUTATION SITE;  Surgeon: Wallace Going, DO;  Location: [facility];  Service: Plastics;  Laterality: Left;   INCISION AND DRAINAGE OF WOUND  04/17/2012   Procedure: IRRIGATION AND DEBRIDEMENT WOUND;  Surgeon: Theodoro Kos, DO;  Location: [place];  Service: Plastics;  Laterality: N/A;  OF ABDOMINAL WOUND, SURGICAL PREP AND PLACEMENT OF VAC, REMOVAL FOREHEAD SKIN LESION   INCISION AND DRAINAGE OF WOUND N/A 04/24/2012   Procedure: IRRIGATION AND DEBRIDEMENT WOUND;  Surgeon: Theodoro Kos, DO;  Location: [place] [facility];  Service: Plastics;  Laterality: N/A;  I & D ABDOMINAL WOUND WITH VAC AND ACELL   INCISION AND DRAINAGE OF WOUND N/A 05/01/2012   Procedure: IRRIGATION AND DEBRIDEMENT WOUND;  Surgeon: Theodoro Kos, DO;  Location: [place] [facility];  Service: Plastics;  Laterality: N/A;  WITH SURGICAL PREP AND PLACEMENT OF VAC   INCISION AND DRAINAGE OF WOUND N/A 05/08/2012   Procedure: IRRIGATION AND DEBRIDEMENT OF ABD WOUND SURGICAL PREP AND PLACEMENT OF VAC ;  Surgeon: Theodoro Kos, DO;  Location: [facility];  Service: Plastics;  Laterality: N/A;  IRRIGATION AND DEBRIDEMENT OF ABD WOUND SURGICAL PREP AND PLACEMENT OF VAC    INCISION AND DRAINAGE OF WOUND N/A 05/15/2012   Procedure: IRRIGATION AND DEBRIDEMENT OF ABDOMINAL WOUND WITH POSSIBLE SURGICAL PREP AND PLACEMENT OF VAC;  Surgeon: Theodoro Kos, DO;  Location: [facility];  Service: Plastics;  Laterality: N/A;   INCISION AND DRAINAGE OF WOUND N/A 05/22/2012   Procedure: IRRIGATION AND DEBRIDEMENT OF ABODOMINAL WOUND WITH  SURGICAL PREP AND VAC PLACEMENT;  Surgeon: Theodoro Kos, DO;  Location: [facility];  Service: Plastics;  Laterality: N/A;   INCISION AND DRAINAGE OF WOUND N/A 06/28/2012   Procedure: IRRIGATION AND DEBRIDEMENT OF ABDOMINAL ULCER SURGICAL PREP AND PLACEMENT OF ACELL AND VAC;  Surgeon: Theodoro Kos, DO;  Location: [facility];  Service: Plastics;  Laterality: N/A;   INCISIONAL HERNIA REPAIR  02/07/2012   Procedure: HERNIA REPAIR INCISIONAL;  Surgeon: Gayland Curry, MD,FACS;  Location: [facility];  Service: General;;  Open, Primary repair, strangulated Incisional hernia.   LESION REMOVAL  04/17/2012   Procedure: LESION REMOVAL;  Surgeon: Theodoro Kos, DO;  Location: [place];  Service: Plastics;  Laterality: N/A;   CENTER OF FOREHEAD, REMOVAL FORHEAD SKIN LESION   ORIF TOE FRACTURE Left 02/09/2017   Procedure: EXCISION TALAR HEAD, INTERNAL FIXATION MEDIAL  COLUMN LEFT FOOT;  Surgeon: Newt Minion, MD;  Location: [place] [facility];  Service: Orthopedics;  Laterality: Left;   POLYPECTOMY N/A 05/14/2014   Procedure: ENDOMETRIAL POLYPECTOMY;  Surgeon: Jonnie Kind, MD;  [facility];  Service: Gynecology;  Laterality: N/A;   STUMP REVISION Left 08/31/2017   Procedure: REVISION LEFT BELOW KNEE AMPUTATION;  Surgeon: Newt Minion, MD;  Location: [facility];  Service: Orthopedics;  Laterality: Left;    Current Facility-Administered Medications  Medication Dose Route Frequency Provider Last Rate Last Admin   0.9 %  sodium chloride infusion  250 mL Intravenous PRN Fenton, Clint R, PA       apixaban (ELIQUIS) tablet 5 mg  5 mg Oral BID Fenton, Clint R, PA       atorvastatin (LIPITOR) tablet 20 mg  20 mg Oral QHS Fenton, Clint R, PA       dofetilide (TIKOSYN) capsule 500 mcg  500 mcg Oral BID Fenton, Clint R, PA       [START ON 09/10/2020] fenofibrate tablet 160 mg  160 mg Oral Daily Fenton, Clint R, PA       fluticasone (FLONASE) 50 MCG/ACT nasal spray 1 spray  1 spray Each Nare Daily PRN Fenton, Clint R, PA       irbesartan (AVAPRO) tablet 150 mg  150 mg  Oral Daily Fenton, Clint R, PA       [START ON 09/10/2020] loratadine (CLARITIN) tablet 10 mg  10 mg Oral Daily Fenton, Clint R, PA       [START ON 09/10/2020] magnesium oxide (MAG-OX) tablet 400 mg  400 mg Oral Daily Fenton, Clint R, PA       magnesium sulfate IVPB 2 g 50 mL  2 g Intravenous Once Bitonti, Michael T, RPH       nitroGLYCERIN (NITROSTAT) SL tablet 0.4 mg  0.4 mg Sublingual Q5 Min x 3 PRN Fenton, Clint R, PA       [START ON 09/10/2020] pneumococcal 23 valent vaccine (PNEUMOVAX-23) injection 0.5 mL  0.5 mL Intramuscular Tomorrow-1000 Uriah Philipson, Jeneen Rinks, MD       Derrill Memo ON 09/10/2020] sertraline (ZOLOFT) tablet 50 mg  50 mg Oral Daily Fenton, Clint R, PA       sodium chloride flush (NS) 0.9 % injection 3 mL  3 mL Intravenous Q12H Fenton, Clint R, PA       sodium chloride flush (NS) 0.9 % injection 3 mL  3 mL Intravenous PRN Fenton, Clint R, PA        Allergies  Allergen Reactions   Ace Inhibitors Cough    Social History   Socioeconomic History   Marital status: Married    Spouse name: Not on file   Number of children: 5   Years of education: Not on file   Highest education level: Not on file  Occupational History   Occupation: Estate manager/land agent    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 5 children. One of whom has pulmonary stenosis. Her only exercise is at work   Social Determinants of Radio broadcast assistant Strain: Low Risk    Difficulty of Paying Living Expenses: Not hard at all  Food  Insecurity: Not on file  Transportation Needs: No Transportation Needs   Lack of Transportation (Medical): No   Lack of Transportation (Non-Medical): No  Physical Activity: Not on file  Stress: Not on file  Social Connections: Not on file  Intimate Partner Violence: Not on file     ROS- All systems are reviewed and negative except as per the HPI above.  Physical Exam: Vitals:   09/09/20 1257 09/09/20 1300  BP: (!) 147/86   Pulse: (!) 59   Resp: 18   Temp: 97.6 F (36.4 C)   TempSrc: Oral   SpO2: 94%   Weight:  126.5 kg  Height: 5\' 6"  (1.676 m)     GEN- The patient is a well appearing obese female, alert and oriented x 3 today.   HEENT-head normocephalic, atraumatic, sclera clear, conjunctiva pink, hearing intact, trachea midline. Lungs- Clear to ausculation bilaterally, normal work of breathing Heart- irregular rate and rhythm, no murmurs, rubs or gallops  GI- soft, NT, ND, + BS Extremities- no clubbing, cyanosis, or edema, left BKA MS- no significant deformity or atrophy Skin- no rash or lesion Psych- euthymic mood, full affect Neuro- strength and sensation are intact   Wt Readings from Last 3 Encounters:  09/09/20 126.5 kg  09/09/20 125.3 kg  09/01/20 126.1 kg    EKG today demonstrates  Afib, PVCs Vent. rate 104 BPM PR interval * ms QRS duration 88 ms QT/QTcB 322/423 ms  Echo 06/18/20 demonstrated  1. Left ventricular ejection fraction, by estimation, is 50 to 55%. The  left ventricle has normal function. The left ventricle has no regional  wall motion abnormalities. Left ventricular diastolic parameters are  indeterminate.   2. Right ventricular systolic function is normal. The right ventricular  size is normal. There is normal pulmonary artery systolic pressure.   3. Left atrial size was mildly dilated.   4. The mitral valve is normal in structure. Mild mitral valve  regurgitation. No evidence of mitral stenosis.   5. The aortic valve has an  indeterminant number of cusps. There is mild  calcification of the aortic valve. There is mild thickening of the aortic  valve. Aortic valve regurgitation is mild. No aortic stenosis is present.   6. The inferior vena cava is normal in size with greater than 50%  respiratory variability, suggesting right atrial pressure of 3 mmHg.   Epic records are reviewed at length today  CHA2DS2-VASc Score = 4  The patient's score is based upon: CHF History: No HTN History: Yes Diabetes History: No Stroke History: No Vascular Disease History: Yes Age Score: 1 Gender Score: 1     ASSESSMENT AND PLAN: 1. Persistent Atrial Fibrillation (ICD10:  I48.19) The patient's CHA2DS2-VASc score is 4, indicating a 4.8% annual risk of stroke.   S/p DCCV on 07/14/20 with quick return of her afib. Patient presents for dofetilide admission. Patient aware of price of dofetilide. Continue Eliquis 5 mg BID, states no missed doses in the last 3 weeks. No recent benadryl use PharmD has screened medications. She has stopped HCTZ and trazodone.  QTc in SR 443-458 ms Labs today show creatinine at 0.92, K+ 4.0 and mag 2.0, CrCl calculated at 117 mL/min  2. Secondary Hypercoagulable  State (ICD10:  [id number]) The patient is at significant risk for stroke/thromboembolism based upon her CHA2DS2-VASc Score of 4.  Continue Apixaban (Eliquis).   3. Obesity Body mass index is 45 kg/m. Lifestyle modification was discussed and encouraged including regular physical activity and weight reduction. [facility].   4. Snoring/daytime somnolence  Sleep study scheduled 11/06/20  5. CAD No anginal symptoms.  6. 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  Not diabetic.  Patient presents for tikosyn admission from AF clinic as above. Electrolytes and QTc stable to start tikosyn.   Legrand Como "Jonni Sanger" [place], PA-C  09/09/2020 1:31 PM    I have seen, examined the  patient, and reviewed the above assessment and plan.  Changes to above are made where necessary.  On exam, iRRR.  The patient has symptomatic afib.  She is admitted for further management.  We will plan to initiate tikosyn as this time.   Risks of medicine including arrhythmia and qt prolongation discussed with her today.  Co Sign: [name], MD

## 2020-09-09 NOTE — Progress Notes (Signed)
Post Tikosyn Qtc 479. Will continue to monitor. Jessie Foot, RN

## 2020-09-09 NOTE — Progress Notes (Signed)
Pharmacy: Dofetilide (Tikosyn) - Initial Consult Assessment and Electrolyte Replacement  Pharmacy consulted to assist in monitoring and replacing electrolytes in this 65 y.o. female admitted on 09/09/2020 undergoing dofetilide initiation. First dofetilide dose: 09/09/20.  Assessment:  Patient Exclusion Criteria: If any screening criteria checked as "Yes", then  patient  should NOT receive dofetilide until criteria item is corrected.  If "Yes" please indicate correction plan.  YES  NO Patient  Exclusion Criteria Correction Plan   []   [x]   Baseline QTc interval is greater than or equal to 440 msec. IF above YES box checked dofetilide contraindicated unless patient has ICD; then may proceed if QTc 500-550 msec or with known ventricular conduction abnormalities may proceed with QTc 550-600 msec. QTc =  443 EP aware   []   [x]   Patient is known or suspected to have a digoxin level greater than 2 ng/ml: No results found for: DIGOXIN     []   [x]   Creatinine clearance less than 20 ml/min (calculated using Cockcroft-Gault, actual body weight and serum creatinine): Estimated Creatinine Clearance: 80.7 mL/min (by C-G formula based on SCr of 0.92 mg/dL).     []   [x]  Patient has received drugs known to prolong the QT intervals within the last 48 hours (phenothiazines, tricyclics or tetracyclic antidepressants, erythromycin, H-1 antihistamines, cisapride, fluoroquinolones, azithromycin, ondansetron).   Updated information on QT prolonging agents is available to be searched on the following database:QT prolonging agents  Pt stopped trazodone, EP aware of low-dose sertraline   []   [x]   Patient received a dose of hydrochlorothiazide (Oretic) alone or in any combination including triamterene (Dyazide, Maxzide) in the last 48 hours. Pt stopped HCTZ   []   [x]  Patient received a medication known to increase dofetilide plasma concentrations prior to initial dofetilide dose:  Trimethoprim (Primsol,  Proloprim) in the last 36 hours Verapamil (Calan, Verelan) in the last 36 hours or a sustained release dose in the last 72 hours Megestrol (Megace) in the last 5 days  Cimetidine (Tagamet) in the last 6 hours Ketoconazole (Nizoral) in the last 24 hours Itraconazole (Sporanox) in the last 48 hours  Prochlorperazine (Compazine) in the last 36 hours     []   [x]   Patient is known to have a history of torsades de pointes; congenital or acquired long QT syndromes.    []   [x]   Patient has received a Class 1 antiarrhythmic with less than 2 half-lives since last dose. (Disopyramide, Quinidine, Procainamide, Lidocaine, Mexiletine, Flecainide, Propafenone)    []   [x]   Patient has received amiodarone therapy in the past 3 months or amiodarone level is greater than 0.3 ng/ml.    Patient has been appropriately anticoagulated with apixaban 5mg  BID.  Labs:    Component Value Date/Time   K 4.0 09/09/2020 1143   MG 2.0 09/09/2020 1143     Plan: Potassium: K >/= 4: Appropriate to initiate Tikosyn, no replacement needed    Magnesium: Mg 1.8-2: Give Mg 2 gm IV x1 to prevent Mg from dropping below 1.8 - do not need to recheck Mg. Appropriate to initiate Tikosyn   Thank you for allowing pharmacy to participate in this patient's care   Arrie Senate, PharmD, BCPS, [facility] Clinical Pharmacist [phone] Please check AMION for all [place] numbers 09/09/2020

## 2020-09-10 LAB — BASIC METABOLIC PANEL
Anion gap: 5 (ref 5–15)
BUN: 16 mg/dL (ref 8–23)
CO2: 27 mmol/L (ref 22–32)
Calcium: 8.7 mg/dL — ABNORMAL LOW (ref 8.9–10.3)
Chloride: 104 mmol/L (ref 98–111)
Creatinine, Ser: 1.09 mg/dL — ABNORMAL HIGH (ref 0.44–1.00)
GFR, Estimated: 56 mL/min — ABNORMAL LOW (ref >60)
Glucose, Bld: 88 mg/dL (ref 70–99)
Potassium: 3.9 mmol/L (ref 3.5–5.1)
Sodium: 136 mmol/L (ref 135–145)

## 2020-09-10 LAB — MAGNESIUM: Magnesium: 2 mg/dL (ref 1.7–2.4)

## 2020-09-10 MED ORDER — POTASSIUM CHLORIDE CRYS ER 20 MEQ PO TBCR
40.0000 meq | EXTENDED_RELEASE_TABLET | Freq: Once | ORAL | Status: AC
  Administered 2020-09-10: 40 meq via ORAL
  Filled 2020-09-10: qty 2

## 2020-09-10 MED ORDER — POTASSIUM CHLORIDE CRYS ER 20 MEQ PO TBCR: 20.0000 meq | EXTENDED_RELEASE_TABLET | Freq: Once | ORAL | Status: DC

## 2020-09-10 MED ORDER — MAGNESIUM SULFATE 2 GM/50ML IV SOLN
2.0000 g | Freq: Once | INTRAVENOUS | Status: AC
  Administered 2020-09-10: 2 g via INTRAVENOUS
  Filled 2020-09-10: qty 50

## 2020-09-10 NOTE — Progress Notes (Signed)
Morning EKG reviewed  Shows pt remains in afib at 79 bpm with stable QTc at ~472 ms.  Continue Tikosyn 500 mcg BID.   Pt will be NPO after midnight for DCCV if remains in [place], [place]  Pager: [phone]  09/10/2020 12:16 PM

## 2020-09-10 NOTE — TOC Benefit Eligibility Note (Signed)
Transition of Care [facility]) Benefit Eligibility Note    Patient Details  Name: TICIA VIRGO MRN: [id number] Date of Birth: 1955/12/11   Medication/Dose: TIKOSYN 500 MCG BIE  CO-PAY- $ 8.00    250 MCG BID  CO-PAY- $ 8.00     125 MCG BID  CO-PAY- $8.00  Covered?: Yes  Tier: 2 Drug  Prescription Coverage Preferred Pharmacy: [facility]  Spoke with Person/Company/Phone Number:: KATE  @ [place] [employer] #  [phone]  Co-Pay: $ 8.00  Prior Approval: No  Deductible: Met  Additional Notes: DOFETILIDE   500 MCG BID   CO-PAY- $29.08    250 MCG BID  CO-PAY $29.08     125 MCG BID  CO-PAY- $ 29.08    Memory Argue Phone Number: 09/10/2020, 3:36 PM

## 2020-09-10 NOTE — Progress Notes (Addendum)
Electrophysiology Rounding Note  Patient Name: Julia West Date of Encounter: 09/10/2020  Primary Cardiologist: [name], MD  Electrophysiologist: None    Subjective   Pt remains in afib on Tikosyn 500 mcg BID   QTc from EKG last pm shows stable QTc at 480 ms  The patient is doing well today.  At this time, the patient denies chest pain, shortness of breath, or any new concerns.  Inpatient Medications    Scheduled Meds:  apixaban  5 mg Oral BID   atorvastatin  20 mg Oral Daily   dofetilide  500 mcg Oral BID   fenofibrate  160 mg Oral Daily   irbesartan  150 mg Oral Daily   magnesium oxide  400 mg Oral Daily   pneumococcal 23 valent vaccine  0.5 mL Intramuscular Tomorrow-1000   sertraline  50 mg Oral Daily   sodium chloride flush  3 mL Intravenous Q12H   Continuous Infusions:  sodium chloride     PRN Meds: sodium chloride, fluticasone, nitroGLYCERIN, sodium chloride flush   Vital Signs    Vitals:   09/09/20 1651 09/09/20 2030 09/10/20 0023 09/10/20 0403  BP: 138/88 (!) 142/86 (!) 156/88 (!) 149/80  Pulse: 92 92 92 83  Resp: 20 18    Temp: 99.6 F (37.6 C) 97.8 F (36.6 C) 97.7 F (36.5 C) 97.8 F (36.6 C)  TempSrc: Oral Oral Oral Oral  SpO2: 99% 93% 93% 93%  Weight:      Height:        Intake/Output Summary (Last 24 hours) at 09/10/2020 3295 Last data filed at 09/09/2020 2000 Gross per 24 hour  Intake 480.6 ml  Output --  Net 480.6 ml   Filed Weights   09/09/20 1300  Weight: 126.5 kg    Physical Exam    GEN- The patient is well appearing, alert and oriented x 3 today.   Head- normocephalic, atraumatic Eyes-  Sclera clear, conjunctiva pink Ears- hearing intact Oropharynx- clear Neck- supple Lungs- Clear to ausculation bilaterally, normal work of breathing Heart- Irregularly irregular rate and rhythm, no murmurs, rubs or gallops GI- soft, NT, ND, + BS Extremities- no clubbing, cyanosis, or edema Skin- no rash or lesion Psych-  euthymic mood, full affect Neuro- strength and sensation are intact  Labs    CBC No results for input(s): WBC, NEUTROABS, HGB, HCT, MCV, PLT in the last 72 hours. Basic Metabolic Panel Recent Labs    09/09/20 1143 09/10/20 0322  NA 137 136  K 4.0 3.9  CL 103 104  CO2 21* 27  GLUCOSE 95 88  BUN 14 16  CREATININE 0.92 1.09*  CALCIUM 9.2 8.7*  MG 2.0 2.0    Potassium  Date/Time Value Ref Range Status  09/10/2020 03:22 AM 3.9 3.5 - 5.1 mmol/L Final   Magnesium  Date/Time Value Ref Range Status  09/10/2020 03:22 AM 2.0 1.7 - 2.4 mg/dL Final    Comment:    Performed at [facility], [place] [address] [place], [place] [address]    Telemetry    AF 80-150s rarely.  Occasional PVCs which preceded Tikosyn (personally reviewed)  Radiology    No results found.   Patient Profile     Julia West is a 65 y.o. female with a past medical history significant for persistent atrial fibrillation.  They were admitted for tikosyn load.   Assessment & Plan    Persistent atrial fibrillation Pt remains in afib on Tikosyn 500 mcg BID  Continue  Eliquis K 3.9 and Mg 2.0.  CHA2DS2/VASc of at least 5.  2. Obesity Body mass index is 45 kg/m.  Have encouraged lifestyle modification.   If pt does not convert chemically, plan on DCCV tomorrow   For questions or updates, please contact [place] Please consult [email or web address] for contact info under Cardiology/STEMI.  Signed, Shirley Friar, PA-C  09/10/2020, 7:27 AM    I have seen, examined the patient, and reviewed the above assessment and plan.  Changes to above are made where necessary.  On exam, iRRR.  Continue tikosyn.  If patient does not convert, will require cardioversion.  Co Sign: [name], MD

## 2020-09-10 NOTE — Progress Notes (Signed)
Pharmacy: Dofetilide (Tikosyn) - Follow Up Assessment and Electrolyte Replacement  Pharmacy consulted to assist in monitoring and replacing electrolytes in this 65 y.o. female admitted on 09/09/2020 undergoing dofetilide initiation. First dofetilide dose: 09/09/20  Labs:    Component Value Date/Time   K 3.9 09/10/2020 0322   MG 2.0 09/10/2020 0322     Plan: Potassium: K 3.8-3.9:  Give KCl 40 mEq po x1   Magnesium: Mg 1.8-2: Give Mg 2 gm IV x1     Thank you for allowing pharmacy to participate in this patient's care   Einar Grad 09/10/2020  7:51 AM

## 2020-09-10 NOTE — Care Management (Signed)
1400 09-10-20 Tikosyn Load, benefits check submitted for co pay. Case Manager will monitor for cost and pharmacy of choice.

## 2020-09-11 ENCOUNTER — Encounter (HOSPITAL_COMMUNITY)
Admission: RE | Disposition: A | Discharge status: Home / Self Care | Payer: Self-pay | Source: Ambulatory Visit | Attending: Internal Medicine

## 2020-09-11 LAB — BASIC METABOLIC PANEL
Anion gap: 7 (ref 5–15)
BUN: 24 mg/dL — ABNORMAL HIGH (ref 8–23)
CO2: 27 mmol/L (ref 22–32)
Calcium: 8.7 mg/dL — ABNORMAL LOW (ref 8.9–10.3)
Chloride: 104 mmol/L (ref 98–111)
Creatinine, Ser: 1.25 mg/dL — ABNORMAL HIGH (ref 0.44–1.00)
GFR, Estimated: 47 mL/min — ABNORMAL LOW (ref >60)
Glucose, Bld: 102 mg/dL — ABNORMAL HIGH (ref 70–99)
Potassium: 4.4 mmol/L (ref 3.5–5.1)
Sodium: 138 mmol/L (ref 135–145)

## 2020-09-11 LAB — MAGNESIUM: Magnesium: 2.5 mg/dL — ABNORMAL HIGH (ref 1.7–2.4)

## 2020-09-11 SURGERY — CARDIOVERSION
Anesthesia: Monitor Anesthesia Care

## 2020-09-11 NOTE — Progress Notes (Addendum)
Pharmacy: Dofetilide (Tikosyn) - Follow Up Assessment and Electrolyte Replacement  Pharmacy consulted to assist in monitoring and replacing electrolytes in this 65 y.o. female admitted on 09/09/2020 undergoing dofetilide initiation. First dofetilide dose: 09/09/20  Labs:    Component Value Date/Time   K 4.4 09/11/2020 0122   MG 2.5 (H) 09/11/2020 0122     Plan: Potassium: K >/= 4: No additional supplementation needed  Magnesium: Mg 1.8-2: Give Mg 2 gm IV x1     Thank you for allowing pharmacy to participate in this patient's care   Arrie Senate, PharmD, BCPS, [facility] Clinical Pharmacist [phone] Please check AMION for all [place] numbers 09/11/2020

## 2020-09-11 NOTE — Progress Notes (Addendum)
Morning EKG reviewed (Not yet in chart but confirmed 09/11/20 at 1032)  Shows remains in NSR/sinus brady at 55 bpm with stable QTc at ~480 ms.  Continue Tikosyn 500 mcg BID.   Plan for home tomorrow if QTc remains stable on current dosing.     Annamaria Helling  Pager: [phone]  09/11/2020 11:46 AM   ADDENDUM They are unable to get her EKG to transfer into her chart. Please see Media for EKG from this am.   Beryle Beams" [place], [place]  09/11/2020 2:43 PM

## 2020-09-11 NOTE — Care Management (Signed)
1301 09-11-20 Case Manager spoke with patient and discussed cost for Tikosyn. Patient would like her first Rx filled via [facility] and then Rx refills for 90 day supply to be sent to [facility] order. No further needs from Case Manager at this time.

## 2020-09-11 NOTE — Progress Notes (Addendum)
Electrophysiology Rounding Note  Patient Name: Julia West Date of Encounter: 09/11/2020  Primary Cardiologist: [name], MD  Electrophysiologist: Dr. Rayann Heman   Subjective   Pt converted to sinus rhythm / sinus brady on Tikosyn 500 mcg BID   QTc from EKG last pm shows stable QTc at ~480 when corrected for rate.  The patient is doing well today.  At this time, the patient denies chest pain, shortness of breath, or any new concerns.  Inpatient Medications    Scheduled Meds:  apixaban  5 mg Oral BID   atorvastatin  20 mg Oral Daily   dofetilide  500 mcg Oral BID   fenofibrate  160 mg Oral Daily   irbesartan  150 mg Oral Daily   magnesium oxide  400 mg Oral Daily   sertraline  50 mg Oral Daily   sodium chloride flush  3 mL Intravenous Q12H   Continuous Infusions:  sodium chloride     PRN Meds: sodium chloride, fluticasone, nitroGLYCERIN, sodium chloride flush   Vital Signs    Vitals:   09/10/20 1521 09/10/20 2046 09/11/20 0009 09/11/20 0434  BP: 125/81 129/60 (!) 111/52 (!) 145/59  Pulse: (!) 50 (!) 56 62 (!) 54  Resp: 16 19 16 19   Temp: (!) 97.5 F (36.4 C) 97.8 F (36.6 C) 98.2 F (36.8 C) 98.2 F (36.8 C)  TempSrc: Oral Oral Oral Oral  SpO2: 93% 96% 98% 97%  Weight:      Height:        Intake/Output Summary (Last 24 hours) at 09/11/2020 0715 Last data filed at 09/10/2020 2030 Gross per 24 hour  Intake 240 ml  Output --  Net 240 ml   Filed Weights   09/09/20 1300  Weight: 126.5 kg    Physical Exam    GEN- The patient is well appearing, alert and oriented x 3 today.   Head- normocephalic, atraumatic Eyes-  Sclera clear, conjunctiva pink Ears- hearing intact Oropharynx- clear Neck- supple Lungs- Clear to ausculation bilaterally, normal work of breathing Heart-  Regular, slow  rate and rhythm, no murmurs, rubs or gallops GI- soft, NT, ND, + BS Extremities- no clubbing, cyanosis, or edema Skin- no rash or lesion Psych- euthymic  mood, full affect Neuro- strength and sensation are intact  Labs    CBC No results for input(s): WBC, NEUTROABS, HGB, HCT, MCV, PLT in the last 72 hours. Basic Metabolic Panel Recent Labs    09/10/20 0322 09/11/20 0122  NA 136 138  K 3.9 4.4  CL 104 104  CO2 27 27  GLUCOSE 88 102*  BUN 16 24*  CREATININE 1.09* 1.25*  CALCIUM 8.7* 8.7*  MG 2.0 2.5*    Potassium  Date/Time Value Ref Range Status  09/11/2020 01:22 AM 4.4 3.5 - 5.1 mmol/L Final   Magnesium  Date/Time Value Ref Range Status  09/11/2020 01:22 AM 2.5 (H) 1.7 - 2.4 mg/dL Final    Comment:    Performed at [facility], [place] [address], [place], [place] [address]    Telemetry    Sinus brady/NSR 40-60s (personally reviewed)  Radiology    No results found.   Patient Profile     HAIZEL GATCHELL is a 65 y.o. female with a past medical history significant for persistent atrial fibrillation.  They were admitted for tikosyn load.   Assessment & Plan    Persistent atrial fibrillation Pt converted to sinus rhythm on Tikosyn 500 mcg BID  Continue Eliquis Electrolytes stable.  CHA2DS2VASC is at least 5.  2. Obesity Body mass index is 45 kg/m.  Has been counseled on importance of weight reduction  3. Mild AKI Cr 0.92 -> 1.09 -> 1.25 Using [place]-Gault pt CrCl is 87. Remains candidate for 500 mcg dosing.   Plan for home tomorrow if QTc remains stable on doses today and tomorrow am. She converted chemically and will not require [place].    For questions or updates, please contact [place] Please consult [email or web address] for contact info under Cardiology/STEMI.  Signed, Shirley Friar, PA-C  09/11/2020, 7:15 AM    I have seen, examined the patient, and reviewed the above assessment and plan.  Changes to above are made where necessary.  On exam, RRR.  Qt is stable.  Anticipate dicharge tomorrow.  Co Sign: [name], MD

## 2020-09-12 LAB — BASIC METABOLIC PANEL
Anion gap: 6 (ref 5–15)
BUN: 21 mg/dL (ref 8–23)
CO2: 28 mmol/L (ref 22–32)
Calcium: 8.8 mg/dL — ABNORMAL LOW (ref 8.9–10.3)
Chloride: 104 mmol/L (ref 98–111)
Creatinine, Ser: 0.97 mg/dL (ref 0.44–1.00)
GFR, Estimated: >60 mL/min (ref >60)
Glucose, Bld: 88 mg/dL (ref 70–99)
Potassium: 4.2 mmol/L (ref 3.5–5.1)
Sodium: 138 mmol/L (ref 135–145)

## 2020-09-12 LAB — MAGNESIUM: Magnesium: 2 mg/dL (ref 1.7–2.4)

## 2020-09-12 MED ORDER — DOFETILIDE 250 MCG PO CAPS: 250.0000 ug | ORAL_CAPSULE | Freq: Two times a day (BID) | ORAL | 3 refills | Status: DC

## 2020-09-12 MED ORDER — MAGNESIUM SULFATE 2 GM/50ML IV SOLN
2.0000 g | Freq: Once | INTRAVENOUS | Status: AC
  Administered 2020-09-12: 2 g via INTRAVENOUS
  Filled 2020-09-12: qty 50

## 2020-09-12 MED ORDER — DOFETILIDE 250 MCG PO CAPS
250.0000 ug | ORAL_CAPSULE | Freq: Two times a day (BID) | ORAL | Status: DC
  Administered 2020-09-12 – 2020-09-13 (×3): 250 ug via ORAL
  Filled 2020-09-12 (×3): qty 1

## 2020-09-12 NOTE — Progress Notes (Signed)
Electrophysiology Rounding Note  Patient Name: Julia West Date of Encounter: 09/12/2020  Primary Cardiologist: [name], MD  Electrophysiologist: None    Subjective   Pt remains in NSR  on Tikosyn 500 mcg BID   QTc from EKG last pm shows prolonged QTc at 500-510+ when measured manually and reviewed with Dr. Rayann Heman.  The patient is doing well today.  At this time, the patient denies chest pain, shortness of breath, or any new concerns.  Inpatient Medications    Scheduled Meds:  apixaban  5 mg Oral BID   atorvastatin  20 mg Oral Daily   dofetilide  250 mcg Oral BID   fenofibrate  160 mg Oral Daily   irbesartan  150 mg Oral Daily   magnesium oxide  400 mg Oral Daily   sertraline  50 mg Oral Daily   sodium chloride flush  3 mL Intravenous Q12H   Continuous Infusions:  sodium chloride     PRN Meds: sodium chloride, fluticasone, nitroGLYCERIN, sodium chloride flush   Vital Signs    Vitals:   09/11/20 2019 09/12/20 0458 09/12/20 0830 09/12/20 1136  BP: (!) 141/96 (!) 159/64 (!) 145/65 (!) 149/56  Pulse:  66 (!) 53 62  Resp:  19 18 18   Temp: 97.7 F (36.5 C) 98.4 F (36.9 C) 97.9 F (36.6 C) 98.5 F (36.9 C)  TempSrc: Oral Oral Oral Oral  SpO2: 93% 96%    Weight:      Height:        Intake/Output Summary (Last 24 hours) at 09/12/2020 1229 Last data filed at 09/12/2020 1138 Gross per 24 hour  Intake 559.8 ml  Output --  Net 559.8 ml   Filed Weights   09/09/20 1300  Weight: 126.5 kg    Physical Exam    GEN- The patient is well appearing, alert and oriented x 3 today.   Head- normocephalic, atraumatic Eyes-  Sclera clear, conjunctiva pink Ears- hearing intact Oropharynx- clear Neck- supple Lungs- Clear to ausculation bilaterally, normal work of breathing Heart-  Slow but regular  rate and rhythm, no murmurs, rubs or gallops GI- soft, NT, ND, + BS Extremities- no clubbing, cyanosis, or edema Skin- no rash or lesion Psych- euthymic mood,  full affect Neuro- strength and sensation are intact  Labs    CBC No results for input(s): WBC, NEUTROABS, HGB, HCT, MCV, PLT in the last 72 hours. Basic Metabolic Panel Recent Labs    09/11/20 0122 09/12/20 0243  NA 138 138  K 4.4 4.2  CL 104 104  CO2 27 28  GLUCOSE 102* 88  BUN 24* 21  CREATININE 1.25* 0.97  CALCIUM 8.7* 8.8*  MG 2.5* 2.0    Potassium  Date/Time Value Ref Range Status  09/12/2020 02:43 AM 4.2 3.5 - 5.1 mmol/L Final   Magnesium  Date/Time Value Ref Range Status  09/12/2020 02:43 AM 2.0 1.7 - 2.4 mg/dL Final    Comment:    Performed at [facility], [place] [address], [place], [place] [address]    Telemetry    Sinus brady/NSR 50-60s and PVCs up to bigeminy (personally reviewed)  Radiology    No results found.   Patient Profile     Julia West is a 65 y.o. female with a past medical history significant for persistent atrial fibrillation.  They were admitted for tikosyn load.   Assessment & Plan    Persistent atrial fibrillation Pt  remains in NSR  on Tikosyn 500 mcg  BID.  Her QTc is prolonged. We will need to decrease her dose to 250 mcg BID. Pt, RN, and Dr. Rayann Heman aware. Continue Eliquis Electrolytes stable.  Plan for home tomorrow if QTc improves on lower dose.   2. Mild AKI 0.92 -> 1.09 -> 1.25 -> 0.97 without intervention.  Follow.   Will call surrounding pharmacies for [employer], as [facility] will be closed.   For questions or updates, please contact [place] Please consult [email or web address] for contact info under Cardiology/STEMI.  Signed, Shirley Friar, PA-C  09/12/2020, 12:29 PM

## 2020-09-12 NOTE — Discharge Summary (Addendum)
ELECTROPHYSIOLOGY PROCEDURE DISCHARGE SUMMARY    Patient ID: Julia West,  MRN: [id number], DOB/AGE: Sep 18, 1955 65 y.o.  Admit date: 09/09/2020 Discharge date: 09/14/2020  Primary Care Physician: [name], MD  Primary Cardiologist: [name], MD  Electrophysiologist: None   Primary Discharge Diagnosis:  1.  Persistent atrial fibrillation status post Tikosyn loading this admission  Secondary Discharge Diagnosis:  2. Mild AKI  Allergies  Allergen Reactions   Ace Inhibitors Cough   Procedures This Admission:  1.  Tikosyn loading  Brief HPI: Julia West is a 65 y.o. female with a past medical history as noted above.  They were referred to EP in the outpatient setting for treatment options of atrial fibrillation.  Risks, benefits, and alternatives to Tikosyn were reviewed with the patient who wished to proceed.    Hospital Course:  The patient was admitted and Tikosyn was initiated.  Renal function and electrolytes were followed during the hospitalization.  She converted to NSR/Sinus brady after her second dose and did NOT require cardioversion. They were monitored until discharge on telemetry which demonstrated SB/NSR 50-60s with relatively frequent PVCS up to bigeminy. On 7/1 her QTc was prolonged requiring down-titration to 250 mcg dosing. On 7/2 QTc appeared to be 520 msec after morning dose. QTC stable after further reduction in dose to 11mcq BID. Patient will continue supplemental magnesium. Rx sent to [facility] @ [facility]. Spoke with pharmacist personally.   On the day of discharge, they were examined by Dr. Rayann Heman  who considered them stable for discharge to home.  Follow-up has been arranged with the Atrial Fibrillation clinic in approximately 1 week and with  EP-APP or Dr. Rayann Heman   in 4 weeks.   Physical Exam: Vitals:   09/13/20 1951 09/14/20 0015 09/14/20 0505 09/14/20 0845  BP: (!) 145/65 (!) 145/58 (!) 147/52 (!) 139/49  Pulse: (!) 53 (!)  54 (!) 48 (!) 55  Resp: 20 18 20 20   Temp: 97.7 F (36.5 C) 98.5 F (36.9 C) 98.5 F (36.9 C) 98.4 F (36.9 C)  TempSrc: Oral Oral Oral Oral  SpO2: 93% 93% 94% 94%  Weight:      Height:        GEN- The patient is well appearing, alert and oriented x 3 today.   HEENT: normocephalic, atraumatic; sclera clear, conjunctiva pink; hearing intact; oropharynx clear; neck supple, no JVP Lymph- no cervical lymphadenopathy Lungs- Clear to ausculation bilaterally, normal work of breathing.  No wheezes, rales, rhonchi Heart- Regular rate and rhythm, no murmurs, rubs or gallops, PMI not laterally displaced GI- soft, non-tender, non-distended, bowel sounds present, no hepatosplenomegaly Extremities- no clubbing, cyanosis, or edema; DP/PT/radial pulses 2+ bilaterally MS- no significant deformity or atrophy Skin- warm and dry, no rash or lesion Psych- euthymic mood, full affect Neuro- strength and sensation are intact  Labs:   Lab Results  Component Value Date   WBC 11.8 (H) 08/19/2020   HGB 15.7 08/19/2020   HCT 47.5 (H) 08/19/2020   MCV 87 08/19/2020   PLT 287 08/19/2020    Recent Labs  Lab 09/14/20 0258  NA 140  K 4.5  CL 105  CO2 28  BUN 20  CREATININE 0.96  CALCIUM 8.8*  GLUCOSE 94     Discharge Medications:  Allergies as of 09/14/2020       Reactions   Ace Inhibitors Cough        Medication List     TAKE these medications    apixaban 5  MG Tabs tablet Commonly known as: ELIQUIS Take 1 tablet (5 mg total) by mouth 2 (two) times daily.   atorvastatin 20 MG tablet Commonly known as: LIPITOR Take 1 tablet (20 mg total) by mouth daily.   dofetilide 125 MCG capsule Commonly known as: TIKOSYN Take 1 capsule (125 mcg total) by mouth 2 (two) times daily.   fenofibrate 160 MG tablet Take 1 tablet (160 mg total) by mouth daily.   fexofenadine 180 MG tablet Commonly known as: ALLEGRA Take 1 tablet (180 mg total) by mouth daily. For allergy symptoms    fluticasone 50 MCG/ACT nasal spray Commonly known as: FLONASE Place 1 spray into both nostrils daily as needed for allergies or rhinitis.   magnesium oxide 400 MG tablet Commonly known as: MAG-OX Take 1 tablet (400 mg total) by mouth daily.   nitroGLYCERIN 0.4 MG SL tablet Commonly known as: NITROSTAT Place 1 tablet (0.4 mg total) under the tongue every 5 (five) minutes x 3 doses as needed for chest pain. If pain persist after call 911   sertraline 50 MG tablet Commonly known as: ZOLOFT Take 1 tablet (50 mg total) by mouth daily.   valsartan 160 MG tablet Commonly known as: Diovan Take 1 tablet (160 mg total) by mouth daily.        Disposition:  Discharge Instructions     Diet - low sodium heart healthy   Complete by: As directed    Increase activity slowly   Complete by: As directed        Follow-up Information     [facility] Follow up on 09/19/2020.   Specialty: Cardiology Why: @10 :30am Please call for direction and parking instruction Contact information: [address] [id number] [place] [phone]                Duration of Discharge Encounter: Greater than 30 minutes including physician time.  Jarrett Soho, [place]  09/14/2020 11:21 AM

## 2020-09-12 NOTE — Progress Notes (Signed)
6/30 evening EKG reviewed  Shows remains in NSR/sinus brady at 52 bpm with stable QTc at 470 ms. Frequent PVCs noted. PVCs preceded tikosyn  Continue Tikosyn 500 mcg BID.   K 4.2, Mg 2.0  Plan for home this afternoon if QTc remains stable on am dosing.     Shirley Friar, [place]  Pager: [phone]  09/12/2020 6:56 AM  '

## 2020-09-12 NOTE — Progress Notes (Signed)
   Confirmed with  [address] [place], [place], [place] [address]  That they have both the Tikosyn 250 mcg BID (60 tablets) And Tikosyn 125 mcg BID in stock  They are open on Saturday and Sunday.   If insurance coverage is variable, GoodRx will bring the cost down to $50-60 range for either for a month supple.   I have discussed personally with the weekend call PA.   I will send in prescription of 250 mcg BID for her TODAY, so that it is held.   If we have to cut back further, we will call and cancel and send 125 mcg BID instead.   Also reviewed plan with Dr. Rayann Heman.   Legrand Como [address] [place], PA-C  09/12/2020 12:44 PM

## 2020-09-12 NOTE — Care Management Important Message (Signed)
Important Message  Patient Details  Name: Julia West MRN: [id number] Date of Birth: [birth year]   Medicare Important Message Given:  Yes     Orbie Pyo 09/12/2020, 3:27 PM

## 2020-09-13 LAB — BASIC METABOLIC PANEL
Anion gap: 5 (ref 5–15)
BUN: 18 mg/dL (ref 8–23)
CO2: 24 mmol/L (ref 22–32)
Calcium: 7.7 mg/dL — ABNORMAL LOW (ref 8.9–10.3)
Chloride: 107 mmol/L (ref 98–111)
Creatinine, Ser: 0.74 mg/dL (ref 0.44–1.00)
GFR, Estimated: >60 mL/min (ref >60)
Glucose, Bld: 79 mg/dL (ref 70–99)
Potassium: 3.9 mmol/L (ref 3.5–5.1)
Sodium: 136 mmol/L (ref 135–145)

## 2020-09-13 LAB — MAGNESIUM: Magnesium: 2.4 mg/dL (ref 1.7–2.4)

## 2020-09-13 MED ORDER — POTASSIUM CHLORIDE CRYS ER 20 MEQ PO TBCR
40.0000 meq | EXTENDED_RELEASE_TABLET | Freq: Once | ORAL | Status: AC
  Administered 2020-09-13: 40 meq via ORAL
  Filled 2020-09-13: qty 2

## 2020-09-13 MED ORDER — MAGNESIUM SULFATE 2 GM/50ML IV SOLN
2.0000 g | Freq: Once | INTRAVENOUS | Status: AC
  Administered 2020-09-13: 2 g via INTRAVENOUS
  Filled 2020-09-13: qty 50

## 2020-09-13 NOTE — Progress Notes (Signed)
   Progress Note   Subjective   Doing well today, the patient denies CP or SOB.  No new concerns  Inpatient Medications    Scheduled Meds:  apixaban  5 mg Oral BID   atorvastatin  20 mg Oral Daily   dofetilide  250 mcg Oral BID   fenofibrate  160 mg Oral Daily   irbesartan  150 mg Oral Daily   magnesium oxide  400 mg Oral Daily   sertraline  50 mg Oral Daily   sodium chloride flush  3 mL Intravenous Q12H   Continuous Infusions:  sodium chloride     PRN Meds: sodium chloride, fluticasone, nitroGLYCERIN, sodium chloride flush   Vital Signs    Vitals:   09/12/20 1900 09/12/20 2300 09/13/20 0300 09/13/20 0806  BP: 133/61 (!) 141/69 (!) 142/84 (!) 151/66  Pulse: (!) 53 68 (!) 57   Resp: 16 18 16    Temp: 98.6 F (37 C) 98.4 F (36.9 C) 98.5 F (36.9 C) 98.6 F (37 C)  TempSrc: Oral Oral Oral   SpO2: 91% 92% 96%   Weight:      Height:        Intake/Output Summary (Last 24 hours) at 09/13/2020 1122 Last data filed at 09/13/2020 0830 Gross per 24 hour  Intake 720 ml  Output --  Net 720 ml   Filed Weights   09/09/20 1300  Weight: 126.5 kg    Telemetry    Sinus with PVCs - Personally Reviewed  Physical Exam   GEN- The patient is well appearing, alert and oriented x 3 today.   Head- normocephalic, atraumatic Eyes-  Sclera clear, conjunctiva pink Ears- hearing intact Oropharynx- clear Neck- supple, Lungs-  normal work of breathing Heart- Regular rate and rhythm  GI- soft  Extremities-  s/p BKA Skin- no rash or lesion Psych- euthymic mood, full affect Neuro- strength and sensation are intact   Labs    Chemistry Recent Labs  Lab 09/10/20 0322 09/11/20 0122 09/12/20 0243  NA 136 138 138  K 3.9 4.4 4.2  CL 104 104 104  CO2 27 27 28   GLUCOSE 88 102* 88  BUN 16 24* 21  CREATININE 1.09* 1.25* 0.97  CALCIUM 8.7* 8.7* 8.8*  GFRNONAA 56* 47* >60  ANIONGAP 5 7 6     Ekg shows sinus with PVCs in bigeminy,  QTc 520 msec.  HematologyNo results for  input(s): WBC, RBC, HGB, HCT, MCV, MCH, MCHC, RDW, PLT in the last 168 hours.   Patient ID  Julia West is a 65 y.o. female with a past medical history significant for persistent atrial fibrillation.  They were admitted for tikosyn load. Assessment & Plan    1.  Persistent afib Remains in sinus Qt appears to be 520 msec.  I will reduce tikosyn to 125 mcg BID today. Labs are currently pending.  RN and I have discussed.   She will be looking for them for [place].  If QTc <500 msec, ok to discharge to home tomorrow with close outpatient follow-up in AF clinic  Julia West is assisting with outpatient tikosyn availability.  Julia West, [facility] 09/13/2020 11:22 AM

## 2020-09-13 NOTE — Progress Notes (Addendum)
Pharmacy: Dofetilide (Tikosyn) - Follow Up Assessment and Electrolyte Replacement  Pharmacy consulted to assist in monitoring and replacing electrolytes in this 65 y.o. female admitted on 09/09/2020 undergoing dofetilide initiation. First dofetilide dose: 250 mcg BID   Labs:    Component Value Date/Time   K 4.2 09/12/2020 0243   MG 2.0 09/12/2020 0243     Plan: Potassium: K 3.9: Give 40 mEq PO x1  Magnesium: Mg 1.8-2: Give Mg 2 gm IV x1      Thank you for allowing pharmacy to participate in this patient's care   Benetta Spar, PharmD, BCPS, [facility] Clinical Pharmacist  Please check AMION for all [place] phone numbers After 10:00 PM, call [place] [phone]

## 2020-09-13 NOTE — Plan of Care (Signed)

## 2020-09-13 NOTE — Plan of Care (Signed)
Problem: Education: Goal: Knowledge of General Education information will improve Description: Including pain rating scale, medication(s)/side effects and non-pharmacologic comfort measures 09/13/2020 0805 by Camillia Herter, RN Outcome: Progressing 09/13/2020 0719 by Camillia Herter, RN Outcome: Progressing   Problem: Health Behavior/Discharge Planning: Goal: Ability to manage health-related needs will improve 09/13/2020 0805 by Camillia Herter, RN Outcome: Progressing 09/13/2020 0719 by Camillia Herter, RN Outcome: Progressing   Problem: Clinical Measurements: Goal: Ability to maintain clinical measurements within normal limits will improve 09/13/2020 0805 by Camillia Herter, RN Outcome: Progressing 09/13/2020 0719 by Camillia Herter, RN Outcome: Progressing Goal: Will remain free from infection 09/13/2020 0805 by Camillia Herter, RN Outcome: Progressing 09/13/2020 0719 by Camillia Herter, RN Outcome: Progressing Goal: Diagnostic test results will improve 09/13/2020 0805 by Camillia Herter, RN Outcome: Progressing 09/13/2020 0719 by Camillia Herter, RN Outcome: Progressing Goal: Respiratory complications will improve 09/13/2020 0805 by Camillia Herter, RN Outcome: Progressing 09/13/2020 0719 by Camillia Herter, RN Outcome: Progressing Goal: Cardiovascular complication will be avoided 09/13/2020 0805 by Camillia Herter, RN Outcome: Progressing 09/13/2020 0719 by Camillia Herter, RN Outcome: Progressing   Problem: Activity: Goal: Risk for activity intolerance will decrease 09/13/2020 0805 by Camillia Herter, RN Outcome: Progressing 09/13/2020 0719 by Camillia Herter, RN Outcome: Progressing   Problem: Nutrition: Goal: Adequate nutrition will be maintained 09/13/2020 0805 by Camillia Herter, RN Outcome: Progressing 09/13/2020 0719 by Camillia Herter, RN Outcome: Progressing   Problem: Coping: Goal: Level of anxiety will decrease 09/13/2020 0805 by Camillia Herter, RN Outcome: Progressing 09/13/2020 0719 by Camillia Herter, RN Outcome: Progressing   Problem: Elimination: Goal: Will not experience complications related to bowel motility 09/13/2020 0805 by Camillia Herter, RN Outcome: Progressing 09/13/2020 0719 by Camillia Herter, RN Outcome: Progressing Goal: Will not experience complications related to urinary retention 09/13/2020 0805 by Camillia Herter, RN Outcome: Progressing 09/13/2020 0719 by Camillia Herter, RN Outcome: Progressing   Problem: Pain Managment: Goal: General experience of comfort will improve 09/13/2020 0805 by Camillia Herter, RN Outcome: Progressing 09/13/2020 0719 by Camillia Herter, RN Outcome: Progressing   Problem: Safety: Goal: Ability to remain free from injury will improve 09/13/2020 0805 by Camillia Herter, RN Outcome: Progressing 09/13/2020 0719 by Camillia Herter, RN Outcome: Progressing   Problem: Skin Integrity: Goal: Risk for impaired skin integrity will decrease 09/13/2020 0805 by Camillia Herter, RN Outcome: Progressing 09/13/2020 0719 by Camillia Herter, RN Outcome: Progressing   Problem: Education: Goal: Knowledge of disease or condition will improve 09/13/2020 0805 by Camillia Herter, RN Outcome: Progressing 09/13/2020 0719 by Camillia Herter, RN Outcome: Progressing Goal: Understanding of medication regimen will improve 09/13/2020 0805 by Camillia Herter, RN Outcome: Progressing 09/13/2020 0719 by Camillia Herter, RN Outcome: Progressing Goal: Individualized Educational Video(s) 09/13/2020 0805 by Camillia Herter, RN Outcome: Progressing 09/13/2020 0719 by Camillia Herter, RN Outcome: Progressing   Problem: Activity: Goal: Ability to tolerate increased activity will improve 09/13/2020 0805 by Camillia Herter, RN Outcome: Progressing 09/13/2020 0719 by Camillia Herter, RN Outcome: Progressing   Problem: Cardiac: Goal: Ability to achieve and maintain adequate cardiopulmonary perfusion will improve 09/13/2020 0805 by Camillia Herter, RN Outcome: Progressing 09/13/2020 0719 by Camillia Herter, RN Outcome: Progressing   Problem: Health Behavior/Discharge Planning: Goal: Ability to safely manage health-related needs after discharge will improve 09/13/2020 0805 by Camillia Herter,  RN Outcome: Progressing 09/13/2020 0719 by Camillia Herter, RN Outcome: Progressing

## 2020-09-14 LAB — BASIC METABOLIC PANEL
Anion gap: 7 (ref 5–15)
BUN: 20 mg/dL (ref 8–23)
CO2: 28 mmol/L (ref 22–32)
Calcium: 8.8 mg/dL — ABNORMAL LOW (ref 8.9–10.3)
Chloride: 105 mmol/L (ref 98–111)
Creatinine, Ser: 0.96 mg/dL (ref 0.44–1.00)
GFR, Estimated: >60 mL/min (ref >60)
Glucose, Bld: 94 mg/dL (ref 70–99)
Potassium: 4.5 mmol/L (ref 3.5–5.1)
Sodium: 140 mmol/L (ref 135–145)

## 2020-09-14 LAB — MAGNESIUM: Magnesium: 2 mg/dL (ref 1.7–2.4)

## 2020-09-14 MED ORDER — DOFETILIDE 125 MCG PO CAPS: 125.0000 ug | ORAL_CAPSULE | Freq: Two times a day (BID) | ORAL | 6 refills | Status: DC

## 2020-09-14 MED ORDER — MAGNESIUM SULFATE 2 GM/50ML IV SOLN
2.0000 g | Freq: Once | INTRAVENOUS | Status: AC
  Administered 2020-09-14: 2 g via INTRAVENOUS
  Filled 2020-09-14: qty 50

## 2020-09-14 MED ORDER — DOFETILIDE 125 MCG PO CAPS
125.0000 ug | ORAL_CAPSULE | Freq: Two times a day (BID) | ORAL | Status: DC
  Administered 2020-09-14: 125 ug via ORAL
  Filled 2020-09-14 (×2): qty 1

## 2020-09-14 MED ORDER — MAGNESIUM OXIDE 400 MG PO TABS: 400.0000 mg | ORAL_TABLET | Freq: Every day | ORAL | 3 refills | Status: DC

## 2020-09-14 NOTE — Progress Notes (Addendum)
   09/14/20 1341  What Happened  Was fall witnessed? No  Was patient injured? No  Patient found on floor  Found by Staff-comment  Stated prior activity shower  Follow Up  MD notified Sharrell Ku, PA  Time MD notified [phone]  Family notified No - patient refusal  Additional tests No  Progress note created (see row info) Yes  Adult Fall Risk Assessment  Risk Factor Category (scoring not indicated) Fall has occurred during this admission (document High fall risk)  Age 65  Fall History: Fall within 6 months prior to admission 5  Elimination; Bowel and/or Urine Incontinence 0  Elimination; Bowel and/or Urine Urgency/Frequency 0  Medications: includes PCA/Opiates, Anti-convulsants, Anti-hypertensives, Diuretics, Hypnotics, Laxatives, Sedatives, and Psychotropics 5  Patient Care Equipment 1  Mobility-Assistance 0  Mobility-Gait 0  Mobility-Sensory Deficit 0  Altered awareness of immediate physical environment 0  Impulsiveness 0  Lack of understanding of one's physical/cognitive limitations 0  Total Score 12  Patient Fall Risk Level High fall risk- due to fall this admission  Adult Fall Risk Interventions  Required Bundle Interventions *See Row Information* High fall risk   Additional Interventions Use of appropriate toileting equipment (bedpan, BSC, etc.)  Vitals  Temp 97.6 F (36.4 C)  Temp Source Oral  BP (!) 167/65  MAP (mmHg) 92  BP Location Left Arm  BP Method Automatic  Patient Position (if appropriate) Sitting  Pulse Rate (!) 58  Pulse Rate Source Radial  Resp 20  Oxygen Therapy  SpO2 94 %  O2 Device Room Air  Pain Assessment  Pain Scale 0-10  Pain Score 0  PCA/Epidural/Spinal Assessment  Respiratory Pattern Regular;Unlabored  Neurological  Neuro (WDL) WDL  Level of Consciousness Alert  Orientation Level Oriented X4  Cognition Appropriate at baseline  Speech Clear  Neuro Symptoms None  Glasgow Coma Scale  Eye Opening 4  Best Verbal Response (NON-intubated) 5   Best Motor Response 6  Glasgow Coma Scale Score 15  Musculoskeletal  Musculoskeletal (WDL) X  Assistive Device Crutches;Other (Comment) (Prosthesis)  Generalized Weakness Yes  Weight Bearing Restrictions No  Musculoskeletal Details  LLE BKA  LLE Ortho/Supportive Device Prosthesis  Integumentary  Integumentary (WDL) X  Skin Color Appropriate for ethnicity  Skin Condition Dry  Skin Integrity Amputation;Ecchymosis  Amputation Location Leg  Amputation Location Orientation Left  Amputation Intervention Other (Comment) (assessed)  Ecchymosis Location Arm  Ecchymosis Location Orientation Bilateral  Ecchymosis Intervention Other (Comment) (Assessed)  Skin Turgor Non-tenting  Pain Screening  Response to Interventions  (Pt denies any pain)  Clinical Progression Not changed

## 2020-09-14 NOTE — Progress Notes (Signed)
Melina Copa, PA. notified after having a conversation with the pt post fall that pt can be safely discharge home.

## 2020-09-14 NOTE — Progress Notes (Signed)
Notified by nursing of mechanical fall. No significant injuries sustained. Patient had gotten dressed for discharge and was reaching for her prosthetic to put it on and slid off bed onto her bottom. No dizziness or symptoms leading to fall. She did not hit her head or sustain any trauma. No visible ecchymosis or injuries to the impact site and she feels great. Vitals remain consistent with prior values. She does not regularly fall, states she has only fallen 1 other time since leg amputated, denies any further needs at this time. OK to proceed with DC. On call MD also notified. Warning sx reviewed with patient who feels comfortable and eager to proceed with DC.

## 2020-09-14 NOTE — Progress Notes (Signed)
Pharmacy: Dofetilide (Tikosyn) - Follow Up Assessment and Electrolyte Replacement  Pharmacy consulted to assist in monitoring and replacing electrolytes in this 65 y.o. female admitted on 09/09/2020 undergoing dofetilide initiation. First dofetilide dose: 09/09/20  Labs:    Component Value Date/Time   K 4.5 09/14/2020 0258   MG 2.0 09/14/2020 0258     Plan: Potassium: K >/= 4: No additional supplementation needed  Magnesium: Mg 1.8-2: Give Mg 2 gm IV x1     Thank you for allowing pharmacy to participate in this patient's care   Arrie Senate, PharmD, BCPS, [facility] Clinical Pharmacist [phone] Please check AMION for all [place] numbers 09/14/2020

## 2020-09-14 NOTE — Progress Notes (Signed)
Qt has improved by pm ekg yesterday. Will give tikosyn 125 mcg this am.  If subsequent ECG is stable, will plan discharge to home today.  [name] MD, [place] 09/14/2020 8:27 AM

## 2020-09-19 ENCOUNTER — Other Ambulatory Visit (HOSPITAL_COMMUNITY): Payer: Self-pay | Admitting: *Deleted

## 2020-09-19 ENCOUNTER — Ambulatory Visit (HOSPITAL_COMMUNITY)
Admit: 2020-09-19 | Discharge: 2020-09-19 | Disposition: A | Discharge status: Home / Self Care | Payer: Medicare Other | Attending: Nurse Practitioner | Admitting: Nurse Practitioner

## 2020-09-19 ENCOUNTER — Other Ambulatory Visit: Payer: Self-pay

## 2020-09-19 ENCOUNTER — Encounter (HOSPITAL_COMMUNITY): Payer: Self-pay | Admitting: Nurse Practitioner

## 2020-09-19 VITALS — BP 156/56 | HR 48 | Ht 66.0 in | Wt 279.4 lb

## 2020-09-19 DIAGNOSIS — Z7901 Long term (current) use of anticoagulants: Secondary | ICD-10-CM | POA: Diagnosis not present

## 2020-09-19 DIAGNOSIS — R400 Somnolence: Secondary | ICD-10-CM | POA: Insufficient documentation

## 2020-09-19 DIAGNOSIS — I700 Atherosclerosis of aorta: Secondary | ICD-10-CM | POA: Diagnosis not present

## 2020-09-19 DIAGNOSIS — Z951 Presence of aortocoronary bypass graft: Secondary | ICD-10-CM | POA: Insufficient documentation

## 2020-09-19 DIAGNOSIS — Z79899 Other long term (current) drug therapy: Secondary | ICD-10-CM | POA: Diagnosis not present

## 2020-09-19 DIAGNOSIS — R0683 Snoring: Secondary | ICD-10-CM | POA: Diagnosis not present

## 2020-09-19 DIAGNOSIS — I252 Old myocardial infarction: Secondary | ICD-10-CM | POA: Insufficient documentation

## 2020-09-19 DIAGNOSIS — I080 Rheumatic disorders of both mitral and aortic valves: Secondary | ICD-10-CM | POA: Diagnosis not present

## 2020-09-19 DIAGNOSIS — Z6841 Body Mass Index (BMI) 40.0 and over, adult: Secondary | ICD-10-CM | POA: Insufficient documentation

## 2020-09-19 DIAGNOSIS — I10 Essential (primary) hypertension: Secondary | ICD-10-CM | POA: Insufficient documentation

## 2020-09-19 DIAGNOSIS — Z87891 Personal history of nicotine dependence: Secondary | ICD-10-CM | POA: Diagnosis not present

## 2020-09-19 DIAGNOSIS — D6869 Other thrombophilia: Secondary | ICD-10-CM | POA: Insufficient documentation

## 2020-09-19 DIAGNOSIS — I4819 Other persistent atrial fibrillation: Secondary | ICD-10-CM | POA: Diagnosis not present

## 2020-09-19 DIAGNOSIS — E785 Hyperlipidemia, unspecified: Secondary | ICD-10-CM | POA: Diagnosis not present

## 2020-09-19 DIAGNOSIS — I444 Left anterior fascicular block: Secondary | ICD-10-CM | POA: Insufficient documentation

## 2020-09-19 DIAGNOSIS — I2510 Atherosclerotic heart disease of native coronary artery without angina pectoris: Secondary | ICD-10-CM | POA: Insufficient documentation

## 2020-09-19 LAB — BASIC METABOLIC PANEL
Anion gap: 5 (ref 5–15)
BUN: 16 mg/dL (ref 8–23)
CO2: 28 mmol/L (ref 22–32)
Calcium: 8.9 mg/dL (ref 8.9–10.3)
Chloride: 105 mmol/L (ref 98–111)
Creatinine, Ser: 0.91 mg/dL (ref 0.44–1.00)
GFR, Estimated: >60 mL/min (ref >60)
Glucose, Bld: 86 mg/dL (ref 70–99)
Potassium: 4 mmol/L (ref 3.5–5.1)
Sodium: 138 mmol/L (ref 135–145)

## 2020-09-19 LAB — MAGNESIUM: Magnesium: 2 mg/dL (ref 1.7–2.4)

## 2020-09-19 MED ORDER — DOFETILIDE 125 MCG PO CAPS: 125.0000 ug | ORAL_CAPSULE | Freq: Two times a day (BID) | ORAL | 1 refills | Status: DC

## 2020-09-19 NOTE — Progress Notes (Signed)
Primary Care Physician: [name], MD Primary Cardiologist: Dr Percival Spanish Primary Electrophysiologist: none Referring Physician: Dr Lindell Noe is a 65 y.o. female with a history of CAD, HTN, HLD, atrial fibrillation who presents for follow up in the [facility].  The patient was initially diagnosed with atrial fibrillation 3/115/22 incidentally at her [facility] with [facility]. She is fairly asymptomatic with her arrhythmia but does have palpitations at times. Patient is on Eliquis for a CHADS2VASC score of 4. She underwent DCCV on 07/14/20 but her afib returned before follow up on 08/20/20. She does admit to snoring and daytime somnolence. She denies significant alcohol use.   On follow up today, patient presents for dofetilide admission. She has stopped HCTZ and trazodone. She denies any missed doses of anticoagulation in the last 3 weeks.   F/u in afib clinic, 09/19/20. She is mow one week s/p Tikosyn load and is in SR with a v rate of 48. Her heart rates at home are at the lowest of 48 but mostly in the 50's. She is not symptomatic with this. She is being compliant with Tikosyn and anticoagulation. She does not feel any better but sleep study is pending. She feels sleepy during the day and does not sleep well at night.   Today, she denies symptoms of chest pain, shortness of breath, orthopnea, PND, lower extremity edema, dizziness, presyncope, syncope, bleeding, or neurologic sequela. The patient is tolerating medications without difficulties and is otherwise without complaint today.    Atrial Fibrillation Risk Factors:  she does have symptoms or diagnosis of sleep apnea. she does not have a history of rheumatic fever. she does not have a history of alcohol use. The patient does not have a history of early familial atrial fibrillation or other arrhythmias.  she has a BMI of Body mass index is 45.1 kg/m.Marland Kitchen Filed  Weights   09/19/20 1032  Weight: 126.7 kg     Family History  Problem Relation Age of Onset   Heart attack Mother    Hypertension Mother    Heart disease Mother    Sudden death Mother    Obesity Mother    Hypertension Sister    Diabetes Brother    Birth defects Son        heart defect     Atrial Fibrillation Management history:  Previous antiarrhythmic drugs: none Previous cardioversions: 07/14/20 Previous ablations: none CHADS2VASC score: 4 Anticoagulation history: Eliquis   Past Medical History:  Diagnosis Date   Acute myocardial infarction    with a ruptured plaque in the circumflex in 2003   Anxiety    Bilateral swelling of feet    Cancer ([place])    basal cell face   Chest pain    Complication of anesthesia    states low O2 sats post-op 11/13, always slow to awaken   Degenerative joint disease    Dehiscence of amputation stump ([place])    left BKA   Depression    Dyspnea    with activity- car to house - steps   Fatty liver    Gallbladder problem    Heart disease    HTN (hypertension)    Hyperlipidemia LDL goal <70    Incarcerated hernia    Internal and external hemorrhoids without complication    Joint pain    Lymphedema    right side of body   Lymphedema of arm    right   Morbid  obesity ([place])    Rheumatoid arthritis ([place])    Surgical wound, non healing ABDOMINAL    has wound vac @ 125 mm Hg   Ventral hernia    Past Surgical History:  Procedure Laterality Date   AMPUTATION Left 05/06/2017   Procedure: BELOW KNEE AMPUTATION;  Surgeon: Newt Minion, MD;  Location: [place];  Service: Orthopedics;  Laterality: Left;   APPLICATION OF WOUND VAC     BOWEL RESECTION  02/07/2012   Procedure: SMALL BOWEL RESECTION;  Surgeon: Gayland Curry, MD,FACS;  Location: [place];  Service: General;;   CARDIOVASCULAR STRESS TEST  01-17-2012  DR HOCHREIN   LOW RISK NUCLEAR TEST   CARDIOVERSION N/A 07/14/2020   Procedure: CARDIOVERSION;  Surgeon: Arnoldo Lenis, MD;   [facility]: [facility];  Service: Endoscopy;  Laterality: N/A;   CESAREAN SECTION     x 4 in remote past   CHOLECYSTECTOMY     CORONARY ARTERY BYPASS GRAFT  2003   by Dr. Servando Snare. LIMA to the LAD, free RIMA to the circumflex. Stress perfusion study December 2009 with no high-risk areas of ischemia. She has a well-preserved ejection fraction   HYSTEROSCOPY WITH D & C N/A 05/14/2014   Procedure: DILATATION AND CURETTAGE (no specimen); HYSTEROSCOPY;  Surgeon: Jonnie Kind, MD;  Location: [facility];  Service: Gynecology;  Laterality: N/A;   I & D EXTREMITY Left 06/21/2017   Procedure: IRRIGATION AND DEBRIDEMENT OF LEFT LEG AMPUTATION SITE;  Surgeon: Wallace Going, DO;  Location: [facility];  Service: Plastics;  Laterality: Left;   INCISION AND DRAINAGE OF WOUND  04/17/2012   Procedure: IRRIGATION AND DEBRIDEMENT WOUND;  Surgeon: Theodoro Kos, DO;  Location: [place];  Service: Plastics;  Laterality: N/A;  OF ABDOMINAL WOUND, SURGICAL PREP AND PLACEMENT OF VAC, REMOVAL FOREHEAD SKIN LESION   INCISION AND DRAINAGE OF WOUND N/A 04/24/2012   Procedure: IRRIGATION AND DEBRIDEMENT WOUND;  Surgeon: Theodoro Kos, DO;  Location: [place];  Service: Plastics;  Laterality: N/A;  I & D ABDOMINAL WOUND WITH VAC AND ACELL   INCISION AND DRAINAGE OF WOUND N/A 05/01/2012   Procedure: IRRIGATION AND DEBRIDEMENT WOUND;  Surgeon: Theodoro Kos, DO;  Location: [place];  Service: Plastics;  Laterality: N/A;  WITH SURGICAL PREP AND PLACEMENT OF VAC   INCISION AND DRAINAGE OF WOUND N/A 05/08/2012   Procedure: IRRIGATION AND DEBRIDEMENT OF ABD WOUND SURGICAL PREP AND PLACEMENT OF VAC ;  Surgeon: Theodoro Kos, DO;  Location: [place];  Service: Plastics;  Laterality: N/A;  IRRIGATION AND DEBRIDEMENT OF ABD WOUND SURGICAL PREP AND PLACEMENT OF VAC    INCISION AND DRAINAGE OF WOUND N/A 05/15/2012   Procedure: IRRIGATION AND DEBRIDEMENT OF ABDOMINAL WOUND WITH  POSSIBLE SURGICAL PREP AND PLACEMENT OF VAC;  Surgeon: Theodoro Kos, DO;  Location: [place];  Service: Plastics;  Laterality: N/A;   INCISION AND DRAINAGE OF WOUND N/A 05/22/2012   Procedure: IRRIGATION AND DEBRIDEMENT OF ABODOMINAL WOUND WITH  SURGICAL PREP AND VAC PLACEMENT;  Surgeon: Theodoro Kos, DO;  Location: [place];  Service: Plastics;  Laterality: N/A;   INCISION AND DRAINAGE OF WOUND N/A 06/28/2012   Procedure: IRRIGATION AND DEBRIDEMENT OF ABDOMINAL ULCER SURGICAL PREP AND PLACEMENT OF ACELL AND VAC;  Surgeon: Theodoro Kos, DO;  Location: [place];  Service: Plastics;  Laterality: N/A;   INCISIONAL HERNIA REPAIR  02/07/2012   Procedure: HERNIA REPAIR INCISIONAL;  Surgeon: Randall Hiss  Ronnie Derby, MD,FACS;  Location: [place];  Service: General;;  Open, Primary repair, strangulated Incisional hernia.   LESION REMOVAL  04/17/2012   Procedure: LESION REMOVAL;  Surgeon: Theodoro Kos, DO;  Location: [place];  Service: Plastics;  Laterality: N/A;   CENTER OF FOREHEAD, REMOVAL FORHEAD SKIN LESION   ORIF TOE FRACTURE Left 02/09/2017   Procedure: EXCISION TALAR HEAD, INTERNAL FIXATION MEDIAL COLUMN LEFT FOOT;  Surgeon: Newt Minion, MD;  Location: [place];  Service: Orthopedics;  Laterality: Left;   POLYPECTOMY N/A 05/14/2014   Procedure: ENDOMETRIAL POLYPECTOMY;  Surgeon: Jonnie Kind, MD;  [facility];  Service: Gynecology;  Laterality: N/A;   STUMP REVISION Left 08/31/2017   Procedure: REVISION LEFT BELOW KNEE AMPUTATION;  Surgeon: Newt Minion, MD;  Location: [place];  Service: Orthopedics;  Laterality: Left;    Current Outpatient Medications  Medication Sig Dispense Refill   apixaban (ELIQUIS) 5 MG TABS tablet Take 1 tablet (5 mg total) by mouth 2 (two) times daily. 60 tablet 6   atorvastatin (LIPITOR) 20 MG tablet Take 1 tablet (20 mg total) by mouth daily. 90 tablet 3   dofetilide (TIKOSYN) 125 MCG capsule Take 1 capsule  (125 mcg total) by mouth 2 (two) times daily. 60 capsule 6   fenofibrate 160 MG tablet Take 1 tablet (160 mg total) by mouth daily. 90 tablet 3   fexofenadine (ALLEGRA) 180 MG tablet Take 1 tablet (180 mg total) by mouth daily. For allergy symptoms 90 tablet 3   fluticasone (FLONASE) 50 MCG/ACT nasal spray Place 1 spray into both nostrils daily as needed for allergies or rhinitis.     magnesium oxide (MAG-OX) 400 MG tablet Take 1 tablet (400 mg total) by mouth daily. 30 tablet 3   nitroGLYCERIN (NITROSTAT) 0.4 MG SL tablet Place 1 tablet (0.4 mg total) under the tongue every 5 (five) minutes x 3 doses as needed for chest pain. If pain persist after call 911 25 tablet 6   sertraline (ZOLOFT) 50 MG tablet Take 1 tablet (50 mg total) by mouth daily. 90 tablet 1   valsartan (DIOVAN) 160 MG tablet Take 1 tablet (160 mg total) by mouth daily. 30 tablet 3   No current facility-administered medications for this encounter.    Allergies  Allergen Reactions   Ace Inhibitors Cough    Social History   Socioeconomic History   Marital status: Married    Spouse name: Not on file   Number of children: 5   Years of education: Not on file   Highest education level: Not on file  Occupational History   Occupation: Estate manager/land agent    Comment: In the Risk [employer]   Occupation: retired  Tobacco Use   Smoking status: Former    Packs/day: 1.00    Years: 25.00    Pack years: 25.00    Types: Cigarettes    Quit date: 11/17/2011    Years since quitting: 8.8   Smokeless tobacco: Never  Vaping Use   Vaping Use: Never used  Substance and Sexual Activity   Alcohol use: No   Drug use: No   Sexual activity: Yes    Birth control/protection: Post-menopausal  Other Topics Concern   Not on file  Social History Narrative   Lives in [place] with her husband. 5 children. One of whom has pulmonary stenosis. Her only exercise is at work   Social Determinants of Radio broadcast assistant Strain: Low Risk     Difficulty of  Paying Living Expenses: Not hard at all  Food Insecurity: Not on file  Transportation Needs: No Transportation Needs   Lack of Transportation (Medical): No   Lack of Transportation (Non-Medical): No  Physical Activity: Not on file  Stress: Not on file  Social Connections: Not on file  Intimate Partner Violence: Not on file     ROS- All systems are reviewed and negative except as per the HPI above.  Physical Exam: Vitals:   09/19/20 1032  BP: (!) 156/56  Pulse: (!) 48  Weight: 126.7 kg  Height: 5\' 6"  (1.676 m)    GEN- The patient is a well appearing obese female, alert and oriented x 3 today.   HEENT-head normocephalic, atraumatic, sclera clear, conjunctiva pink, hearing intact, trachea midline. Lungs- Clear to ausculation bilaterally, normal work of breathing Heart-regular rate and rhythm, no murmurs, rubs or gallops  GI- soft, NT, ND, + BS Extremities- no clubbing, cyanosis, or edema, left BKA MS- no significant deformity or atrophy Skin- no rash or lesion Psych- euthymic mood, full affect Neuro- strength and sensation are intact   Wt Readings from Last 3 Encounters:  09/19/20 126.7 kg  09/09/20 126.5 kg  09/09/20 125.3 kg    EKG today demonstrates   Sinus brady at 48 bpm  PR interval 164 ms QRS duration 94 ms QT/QTcB 488/435 ms  Echo 06/18/20 demonstrated  1. Left ventricular ejection fraction, by estimation, is 50 to 55%. The  left ventricle has normal function. The left ventricle has no regional  wall motion abnormalities. Left ventricular diastolic parameters are  indeterminate.   2. Right ventricular systolic function is normal. The right ventricular  size is normal. There is normal pulmonary artery systolic pressure.   3. Left atrial size was mildly dilated.   4. The mitral valve is normal in structure. Mild mitral valve  regurgitation. No evidence of mitral stenosis.   5. The aortic valve has an indeterminant number of cusps. There is mild   calcification of the aortic valve. There is mild thickening of the aortic  valve. Aortic valve regurgitation is mild. No aortic stenosis is present.   6. The inferior vena cava is normal in size with greater than 50%  respiratory variability, suggesting right atrial pressure of 3 mmHg.   Epic records are reviewed at length today  CHA2DS2-VASc Score = 4  The patient's score is based upon: CHF History: No HTN History: Yes Diabetes History: No Stroke History: No Vascular Disease History: Yes Age Score: 1 Gender Score: 1     ASSESSMENT AND PLAN: 1. Persistent Atrial Fibrillation (ICD10:  I48.19) The patient's CHA2DS2-VASc score is 4, indicating a 4.8% annual risk of stroke.   S/p DCCV on 07/14/20 with quick return of her afib. Patient presented for dofetilide admission 09/09/20. She is now here for f/u and is in SR  Continue Eliquis 5 mg BID  Reminded not to take benadryl  PharmD has screened medications. She has stopped HCTZ and trazodone for on load of tikosyn.  QTc is stable  Bmet/mag pending   2. Secondary Hypercoagulable State (ICD10:  D68.69) The patient is at significant risk for stroke/thromboembolism based upon her CHA2DS2-VASc Score of 4.  Continue Apixaban (Eliquis).   3. Obesity Body mass index is 45.1 kg/m. Lifestyle modification was discussed and encouraged including regular physical activity and weight reduction. [facility].   4. Snoring/daytime somnolence  Sleep study scheduled 11/06/20  5. CAD No anginal symptoms.  F/u with  Dr. Rayann Heman 8/10    Geroge Baseman. Hanaa Payes, [facility] [address] [place], [place] [address] [phone]

## 2020-09-25 ENCOUNTER — Encounter: Payer: Self-pay | Admitting: Family Medicine

## 2020-09-25 ENCOUNTER — Other Ambulatory Visit: Payer: Self-pay

## 2020-09-25 ENCOUNTER — Ambulatory Visit (INDEPENDENT_AMBULATORY_CARE_PROVIDER_SITE_OTHER): Payer: Medicare Other | Admitting: Family Medicine

## 2020-09-25 VITALS — BP 158/76 | HR 50 | Temp 97.0°F | Ht 66.0 in | Wt 282.2 lb

## 2020-09-25 DIAGNOSIS — G4701 Insomnia due to medical condition: Secondary | ICD-10-CM

## 2020-09-25 DIAGNOSIS — Z79891 Long term (current) use of opiate analgesic: Secondary | ICD-10-CM | POA: Diagnosis not present

## 2020-09-25 DIAGNOSIS — I4819 Other persistent atrial fibrillation: Secondary | ICD-10-CM | POA: Diagnosis not present

## 2020-09-25 MED ORDER — BELSOMRA 10 MG PO TABS: 10.0000 mg | ORAL_TABLET | Freq: Every day | ORAL | 2 refills | Status: DC

## 2020-09-25 NOTE — Patient Instructions (Signed)
Insomnia Insomnia is a sleep disorder that makes it difficult to fall asleep or stay asleep. Insomnia can cause fatigue, low energy, difficulty concentrating, moodswings, and poor performance at work or school. There are three different ways to classify insomnia: Difficulty falling asleep. Difficulty staying asleep. Waking up too early in the morning. Any type of insomnia can be long-term (chronic) or short-term (acute). Both are common. Short-term insomnia usually lasts for three months or less. Chronic insomnia occurs at least three times a week for longer than threemonths. What are the causes? Insomnia may be caused by another condition, situation, or substance, such as: Anxiety. Certain medicines. Gastroesophageal reflux disease (GERD) or other gastrointestinal conditions. Asthma or other breathing conditions. Restless legs syndrome, sleep apnea, or other sleep disorders. Chronic pain. Menopause. Stroke. Abuse of alcohol, tobacco, or illegal drugs. Mental health conditions, such as depression. Caffeine. Neurological disorders, such as Alzheimer's disease. An overactive thyroid (hyperthyroidism). Sometimes, the cause of insomnia may not be known. What increases the risk? Risk factors for insomnia include: Gender. Women are affected more often than men. Age. Insomnia is more common as you get older. Stress. Lack of exercise. Irregular work schedule or working night shifts. Traveling between different time zones. Certain medical and mental health conditions. What are the signs or symptoms? If you have insomnia, the main symptom is having trouble falling asleep or having trouble staying asleep. This may lead to other symptoms, such as: Feeling fatigued or having low energy. Feeling nervous about going to sleep. Not feeling rested in the morning. Having trouble concentrating. Feeling irritable, anxious, or depressed. How is this diagnosed? This condition may be diagnosed based  on: Your symptoms and medical history. Your health care provider may ask about: Your sleep habits. Any medical conditions you have. Your mental health. A physical exam. How is this treated? Treatment for insomnia depends on the cause. Treatment may focus on treating an underlying condition that is causing insomnia. Treatment may also include: Medicines to help you sleep. Counseling or therapy. Lifestyle adjustments to help you sleep better. Follow these instructions at home: Eating and drinking  Limit or avoid alcohol, caffeinated beverages, and cigarettes, especially close to bedtime. These can disrupt your sleep. Do not eat a large meal or eat spicy foods right before bedtime. This can lead to digestive discomfort that can make it hard for you to sleep.  Sleep habits  Keep a sleep diary to help you and your health care provider figure out what could be causing your insomnia. Write down: When you sleep. When you wake up during the night. How well you sleep. How rested you feel the next day. Any side effects of medicines you are taking. What you eat and drink. Make your bedroom a dark, comfortable place where it is easy to fall asleep. Put up shades or blackout curtains to block light from outside. Use a white noise machine to block noise. Keep the temperature cool. Limit screen use before bedtime. This includes: Watching TV. Using your smartphone, tablet, or computer. Stick to a routine that includes going to bed and waking up at the same times every day and night. This can help you fall asleep faster. Consider making a quiet activity, such as reading, part of your nighttime routine. Try to avoid taking naps during the day so that you sleep better at night. Get out of bed if you are still awake after 15 minutes of trying to sleep. Keep the lights down, but try reading or doing a quiet   activity. When you feel sleepy, go back to bed.  General instructions Take over-the-counter  and prescription medicines only as told by your health care provider. Exercise regularly, as told by your health care provider. Avoid exercise starting several hours before bedtime. Use relaxation techniques to manage stress. Ask your health care provider to suggest some techniques that may work well for you. These may include: Breathing exercises. Routines to release muscle tension. Visualizing peaceful scenes. Make sure that you drive carefully. Avoid driving if you feel very sleepy. Keep all follow-up visits as told by your health care provider. This is important. Contact a health care provider if: You are tired throughout the day. You have trouble in your daily routine due to sleepiness. You continue to have sleep problems, or your sleep problems get worse. Get help right away if: You have serious thoughts about hurting yourself or someone else. If you ever feel like you may hurt yourself or others, or have thoughts about taking your own life, get help right away. You can go to your nearest emergency department or call: Your local emergency services (911 in the [place] A suicide crisis helpline, such as the National Suicide Prevention Lifeline at 1-800-273-8255. This is open 24 hours a day. Summary Insomnia is a sleep disorder that makes it difficult to fall asleep or stay asleep. Insomnia can be long-term (chronic) or short-term (acute). Treatment for insomnia depends on the cause. Treatment may focus on treating an underlying condition that is causing insomnia. Keep a sleep diary to help you and your health care provider figure out what could be causing your insomnia. This information is not intended to replace advice given to you by your health care provider. Make sure you discuss any questions you have with your healthcare provider. Document Revised: 01/10/2020 Document Reviewed: 01/10/2020 Elsevier Patient Education  2022 [employer]  

## 2020-09-25 NOTE — Progress Notes (Signed)
Subjective:  Patient ID: Julia West, female    DOB: April 09, 1955  Age: 65 y.o. MRN: [id number]  CC: Insomnia   HPI Julia West presents for difficulty sleeping. She was told she couldn't take trazodone or Tylenol PM for sleep.  Since she was being placed on Tikosyn by Julia West cardiologist.  Since that time she states that she lays awake for 2 hours before falling aseep. Then she awakens multiple times in the night or awakens and osses and turns the rest of the night. .  Julia West energy is diminished and she is exhausted and dozing during the day at times.  Depression screen [facility] 2/9 05/27/2020 02/19/2020 08/16/2019  Decreased Interest 1 0 0  Down, Depressed, Hopeless 1 0 0  PHQ - 2 Score 2 0 0  Altered sleeping 2 - -  Tired, decreased energy 1 - -  Change in appetite 1 - -  Feeling bad or failure about yourself  1 - -  Trouble concentrating 1 - -  Moving slowly or fidgety/restless 0 - -  Suicidal thoughts 0 - -  PHQ-9 Score 8 - -  Difficult doing work/chores Somewhat difficult - -  Some recent data might be hidden    History Julia West has a past medical history of Acute myocardial infarction, Anxiety, Bilateral swelling of feet, Cancer (HCC), Chest pain, Complication of anesthesia, Degenerative joint disease, Dehiscence of amputation stump ([place]), Depression, Dyspnea, Fatty liver, Gallbladder problem, Heart disease, HTN (hypertension), Hyperlipidemia LDL goal <70, Incarcerated hernia, Internal and external hemorrhoids without complication, Joint pain, Lymphedema, Lymphedema of arm, Morbid obesity ([place]), Rheumatoid arthritis ([place]), Surgical wound, non healing (ABDOMINAL ), and Ventral hernia.   She has a past surgical history that includes Cesarean section; Cholecystectomy; Coronary artery bypass graft (2003); Incisional hernia repair (02/07/2012); Bowel resection (02/07/2012); Application if wound vac; Incision and drainage of wound (04/17/2012); Lesion removal (04/17/2012); Incision and  drainage of wound (N/A, 04/24/2012); Incision and drainage of wound (N/A, 05/01/2012); Incision and drainage of wound (N/A, 05/08/2012); Cardiovascular stress test (01-17-2012  [facility]); Incision and drainage of wound (N/A, 05/15/2012); Incision and drainage of wound (N/A, 05/22/2012); Incision and drainage of wound (N/A, 06/28/2012); polypectomy (N/A, 05/14/2014); Hysteroscopy with D & C (N/A, 05/14/2014); ORIF toe fracture (Left, 02/09/2017); Amputation (Left, 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   Julia West family history includes Birth defects in Julia West son; Diabetes in Julia West brother; Heart attack in Julia West mother; Heart disease in Julia West mother; Hypertension in Julia West mother and sister; Obesity in Julia West mother; Sudden death in Julia West mother.She reports that she quit smoking about 8 years ago. Julia West smoking use included cigarettes. She has a 25.00 pack-year smoking history. She has never used smokeless tobacco. She reports that she does not drink alcohol and does not use drugs.    ROS Review of Systems  Constitutional: Negative.   HENT: Negative.    Eyes:  Negative for visual disturbance.  Respiratory:  Negative for shortness of breath.   Cardiovascular:  Negative for chest pain.  Gastrointestinal:  Negative for abdominal pain.  Musculoskeletal:  Negative for arthralgias.  Psychiatric/Behavioral:  Positive for decreased concentration and sleep disturbance.    Objective:  BP (!) 158/76   Pulse (!) 50   Temp (!) 97 F (36.1 C)   Ht 5\' 6"  (1.676 m)   Wt 282 lb 3.2 oz (128 kg)   SpO2 94%   BMI 45.55 kg/m   BP Readings from Last  3 Encounters:  09/25/20 (!) 158/76  09/19/20 (!) 156/56  09/14/20 (!) 167/65    Wt Readings from Last 3 Encounters:  09/25/20 282 lb 3.2 oz (128 kg)  09/19/20 279 lb 6.4 oz (126.7 kg)  09/09/20 278 lb 12.8 oz (126.5 kg)     Physical Exam Constitutional:      General: She is not in acute distress.     Appearance: She is well-developed.  Cardiovascular:     Rate and Rhythm: Normal rate and regular rhythm.  Pulmonary:     Breath sounds: Normal breath sounds.  Musculoskeletal:        General: Normal range of motion.  Skin:    General: Skin is warm and dry.  Neurological:     Mental Status: She is alert and oriented to person, place, and time.     Motor: No weakness.  Psychiatric:        Mood and Affect: Mood normal.      Assessment & Plan:   Julia West was seen today for insomnia.  Diagnoses and all orders for this visit:  Insomnia due to medical condition -     ToxASSURE Select 13 (MW), Urine  Persistent atrial fibrillation (HCC)  OBESITY, MORBID  Other orders -     Suvorexant (BELSOMRA) 10 MG TABS; Take 10 mg by mouth at bedtime.   This medicine is the safest of the choices offered to me through communication with Julia West cardiology team.  None of the typical agents that we use that are nonbenzodiazepine related were acceptable based on Julia West use of Tikosyn.  As result she will be started on the Belsomra and titrated as needed.   I am having Julia West start on Balta. I am also having Julia West maintain Julia West nitroGLYCERIN, apixaban, fluticasone, fenofibrate, sertraline, fexofenadine, atorvastatin, valsartan, magnesium oxide, and dofetilide.  Allergies as of 09/25/2020       Reactions   Ace Inhibitors Cough        Medication List        Accurate as of September 25, 2020 11:59 PM. If you have any questions, ask your nurse or doctor.          apixaban 5 MG Tabs tablet Commonly known as: ELIQUIS Take 1 tablet (5 mg total) by mouth 2 (two) times daily.   atorvastatin 20 MG tablet Commonly known as: LIPITOR Take 1 tablet (20 mg total) by mouth daily.   Belsomra 10 MG Tabs Generic drug: Suvorexant Take 10 mg by mouth at bedtime. Started by: [name], MD   dofetilide 125 MCG capsule Commonly known as: TIKOSYN Take 1 capsule (125 mcg total) by mouth 2  (two) times daily.   fenofibrate 160 MG tablet Take 1 tablet (160 mg total) by mouth daily.   fexofenadine 180 MG tablet Commonly known as: ALLEGRA Take 1 tablet (180 mg total) by mouth daily. For allergy symptoms   fluticasone 50 MCG/ACT nasal spray Commonly known as: FLONASE Place 1 spray into both nostrils daily as needed for allergies or rhinitis.   magnesium oxide 400 MG tablet Commonly known as: MAG-OX Take 1 tablet (400 mg total) by mouth daily.   nitroGLYCERIN 0.4 MG SL tablet Commonly known as: NITROSTAT Place 1 tablet (0.4 mg total) under the tongue every 5 (five) minutes x 3 doses as needed for chest pain. If pain persist after call 911   sertraline 50 MG tablet Commonly known as: ZOLOFT Take 1 tablet (50 mg total) by mouth daily.   valsartan  160 MG tablet Commonly known as: Diovan Take 1 tablet (160 mg total) by mouth daily.         Follow-up: No follow-ups on file.  [name], M.D.

## 2020-09-26 ENCOUNTER — Encounter: Payer: Self-pay | Admitting: Family Medicine

## 2020-09-30 ENCOUNTER — Telehealth: Payer: Self-pay | Admitting: *Deleted

## 2020-09-30 ENCOUNTER — Ambulatory Visit: Payer: Medicare Other | Admitting: Family Medicine

## 2020-09-30 MED ORDER — DOFETILIDE 125 MCG PO CAPS: 125.0000 ug | ORAL_CAPSULE | Freq: Two times a day (BID) | ORAL | 1 refills | Status: DC

## 2020-09-30 NOTE — Telephone Encounter (Signed)
  Dr Percival Spanish . I also sent this to Dr Rayann Heman Can you please send a script to Give your doctor this information for our pharmacy NCPDP ID # [id number]  Tel: [phone] For my Dofetilide since it's a 47.00 co pay @ my pharmacy and that makes 3  scripts that cost 47.00 a month. Which is hard when your on SS. This place is  called [facility] and very affordable. Thank You Julia West [birth year] 1 attachment   [id number]  RX for 90 day supply X 1 sent electronically as requested.  Pt's email was included on the script.

## 2020-10-01 ENCOUNTER — Telehealth: Payer: Self-pay

## 2020-10-01 ENCOUNTER — Ambulatory Visit (INDEPENDENT_AMBULATORY_CARE_PROVIDER_SITE_OTHER): Payer: Medicare Other

## 2020-10-01 VITALS — Ht 66.0 in | Wt 279.0 lb

## 2020-10-01 DIAGNOSIS — Z0001 Encounter for general adult medical examination with abnormal findings: Secondary | ICD-10-CM | POA: Diagnosis not present

## 2020-10-01 DIAGNOSIS — Z1231 Encounter for screening mammogram for malignant neoplasm of breast: Secondary | ICD-10-CM | POA: Diagnosis not present

## 2020-10-01 DIAGNOSIS — I2510 Atherosclerotic heart disease of native coronary artery without angina pectoris: Secondary | ICD-10-CM

## 2020-10-01 DIAGNOSIS — I10 Essential (primary) hypertension: Secondary | ICD-10-CM | POA: Diagnosis not present

## 2020-10-01 DIAGNOSIS — I4819 Other persistent atrial fibrillation: Secondary | ICD-10-CM

## 2020-10-01 DIAGNOSIS — Z599 Problem related to housing and economic circumstances, unspecified: Secondary | ICD-10-CM | POA: Diagnosis not present

## 2020-10-01 DIAGNOSIS — Z780 Asymptomatic menopausal state: Secondary | ICD-10-CM

## 2020-10-01 DIAGNOSIS — R7303 Prediabetes: Secondary | ICD-10-CM

## 2020-10-01 DIAGNOSIS — Z1211 Encounter for screening for malignant neoplasm of colon: Secondary | ICD-10-CM

## 2020-10-01 DIAGNOSIS — I890 Lymphedema, not elsewhere classified: Secondary | ICD-10-CM

## 2020-10-01 DIAGNOSIS — Z0000 Encounter for general adult medical examination without abnormal findings: Secondary | ICD-10-CM

## 2020-10-01 NOTE — Chronic Care Management (AMB) (Signed)
  Chronic Care Management   Note  10/01/2020 Name: [name] MRN: [id number] DOB: [birth year]  Julia West is a 65 y.o. year old female who is a primary care patient of Stacks, Cletus Gash, MD. I reached out to Julia West by phone today in response to a referral sent by Ms. Leone Payor Soter's PCP, [name], MD      Ms. West was given information about Chronic Care Management services today including:  CCM service includes personalized support from designated clinical staff supervised by her physician, including individualized plan of care and coordination with other care providers 24/7 contact phone numbers for assistance for urgent and routine care needs. Service will only be billed when office clinical staff spend 20 minutes or more in a month to coordinate care. Only one practitioner may furnish and bill the service in a calendar month. The patient may stop CCM services at any time (effective at the end of the month) by phone call to the office staff. The patient will be responsible for cost sharing (co-pay) of up to 20% of the service fee (after annual deductible is met).  Patient agreed to services and verbal consent obtained.   Follow up plan: Telephone appointment with care management team member scheduled for:10/16/2020  Noreene Larsson, [place], [place], [place] [address] Direct Dial: [phone] [email or web address]: [email or web address]

## 2020-10-01 NOTE — Progress Notes (Signed)
Subjective:   LYNASIA MELOCHE is a 65 y.o. female who presents for an Initial Medicare Annual Wellness Visit.  Virtual Visit via Telephone Note  I connected with  [name] on 10/01/20 at 10:30 AM EDT by telephone and verified that I am speaking with the correct person using two identifiers.  Location: Patient: Home Provider: [facility] Persons participating in the virtual visit: patient/Nurse Health Advisor   I discussed the limitations, risks, security and privacy concerns of performing an evaluation and management service by telephone and the availability of in person appointments. The patient expressed understanding and agreed to proceed.  Interactive audio and video telecommunications were attempted between this nurse and patient, however failed, due to patient having technical difficulties OR patient did not have access to video capability.  We continued and completed visit with audio only.  Some vital signs may be absent or patient reported.   Reanne Nellums E Hadasah Brugger, LPN   Review of Systems     Cardiac Risk Factors include: advanced age (>67men, >56 women);diabetes mellitus;dyslipidemia;hypertension;family history of premature cardiovascular disease;obesity (BMI >30kg/m2);sedentary lifestyle;smoking/ tobacco exposure     Objective:    Today's Vitals   10/01/20 1030  Weight: 279 lb (126.6 kg)  Height: 5\' 6"  (1.676 m)  PainSc: 0-No pain   Body mass index is 45.03 kg/m.  Advanced Directives 10/01/2020 09/09/2020 07/11/2020 02/13/2018 09/19/2017 08/31/2017 06/21/2017  Does Patient Have a Medical Advance Directive? No No No No No No No  Would patient like information on creating a medical advance directive? No - Patient declined No - Patient declined No - Patient declined No - Patient declined No - Patient declined No - Patient declined -  Pre-existing out of facility DNR order (yellow form or pink MOST form) - - - - - - -    Current Medications (verified) Outpatient Encounter  Medications as of 10/01/2020  Medication Sig   apixaban (ELIQUIS) 5 MG TABS tablet Take 1 tablet (5 mg total) by mouth 2 (two) times daily.   atorvastatin (LIPITOR) 20 MG tablet Take 1 tablet (20 mg total) by mouth daily.   dofetilide (TIKOSYN) 125 MCG capsule Take 1 capsule (125 mcg total) by mouth 2 (two) times daily.   fenofibrate 160 MG tablet Take 1 tablet (160 mg total) by mouth daily.   fexofenadine (ALLEGRA) 180 MG tablet Take 1 tablet (180 mg total) by mouth daily. For allergy symptoms   fluticasone (FLONASE) 50 MCG/ACT nasal spray Place 1 spray into both nostrils daily as needed for allergies or rhinitis.   magnesium oxide (MAG-OX) 400 MG tablet Take 1 tablet (400 mg total) by mouth daily.   nitroGLYCERIN (NITROSTAT) 0.4 MG SL tablet Place 1 tablet (0.4 mg total) under the tongue every 5 (five) minutes x 3 doses as needed for chest pain. If pain persist after call 911   sertraline (ZOLOFT) 50 MG tablet Take 1 tablet (50 mg total) by mouth daily.   Suvorexant (BELSOMRA) 10 MG TABS Take 10 mg by mouth at bedtime.   valsartan (DIOVAN) 160 MG tablet Take 1 tablet (160 mg total) by mouth daily.   [DISCONTINUED] magnesium oxide (MAG-OX) 400 (240 Mg) MG tablet Take 1 tablet by mouth daily.   No facility-administered encounter medications on file as of 10/01/2020.    Allergies (verified) Ace inhibitors   History: Past Medical History:  Diagnosis Date   Acute myocardial infarction    with a ruptured plaque in the circumflex in 2003   Anxiety  Bilateral swelling of feet    Cancer (HCC)    basal cell face   Chest pain    Complication of anesthesia    states low O2 sats post-op 11/13, always slow to awaken   Degenerative joint disease    Dehiscence of amputation stump ([place])    left BKA   Depression    Dyspnea    with activity- car to house - steps   Fatty liver    Gallbladder problem    Heart disease    HTN (hypertension)    Hyperlipidemia LDL goal <70    Incarcerated hernia     Internal and external hemorrhoids without complication    Joint pain    Lymphedema    right side of body   Lymphedema of arm    right   Morbid obesity ([place])    Rheumatoid arthritis ([place])    Surgical wound, non healing ABDOMINAL    has wound vac @ 125 mm Hg   Ventral hernia    Past Surgical History:  Procedure Laterality Date   AMPUTATION Left 05/06/2017   Procedure: BELOW KNEE AMPUTATION;  Surgeon: Newt Minion, MD;  Location: [place];  Service: Orthopedics;  Laterality: Left;   APPLICATION OF WOUND VAC     BOWEL RESECTION  02/07/2012   Procedure: SMALL BOWEL RESECTION;  Surgeon: Gayland Curry, MD,FACS;  Location: [place];  Service: General;;   CARDIOVASCULAR STRESS TEST  01-17-2012  DR HOCHREIN   LOW RISK NUCLEAR TEST   CARDIOVERSION N/A 07/14/2020   Procedure: CARDIOVERSION;  Surgeon: Arnoldo Lenis, MD;  Location: [facility];  Service: Endoscopy;  Laterality: N/A;   CESAREAN SECTION     x 4 in remote past   CHOLECYSTECTOMY     CORONARY ARTERY BYPASS GRAFT  2003   by Dr. Servando Snare. LIMA to the LAD, free RIMA to the circumflex. Stress perfusion study December 2009 with no high-risk areas of ischemia. She has a well-preserved ejection fraction   HYSTEROSCOPY WITH D & C N/A 05/14/2014   Procedure: DILATATION AND CURETTAGE (no specimen); HYSTEROSCOPY;  Surgeon: Jonnie Kind, MD;  Location: [facility];  Service: Gynecology;  Laterality: N/A;   I & D EXTREMITY Left 06/21/2017   Procedure: IRRIGATION AND DEBRIDEMENT OF LEFT LEG AMPUTATION SITE;  Surgeon: Wallace Going, DO;  Location: [facility];  Service: Plastics;  Laterality: Left;   INCISION AND DRAINAGE OF WOUND  04/17/2012   Procedure: IRRIGATION AND DEBRIDEMENT WOUND;  Surgeon: Theodoro Kos, DO;  Location: [place];  Service: Plastics;  Laterality: N/A;  OF ABDOMINAL WOUND, SURGICAL PREP AND PLACEMENT OF VAC, REMOVAL FOREHEAD SKIN LESION   INCISION AND DRAINAGE OF WOUND N/A 04/24/2012   Procedure: IRRIGATION AND  DEBRIDEMENT WOUND;  Surgeon: Theodoro Kos, DO;  Location: [place];  Service: Plastics;  Laterality: N/A;  I & D ABDOMINAL WOUND WITH VAC AND ACELL   INCISION AND DRAINAGE OF WOUND N/A 05/01/2012   Procedure: IRRIGATION AND DEBRIDEMENT WOUND;  Surgeon: Theodoro Kos, DO;  Location: [place];  Service: Plastics;  Laterality: N/A;  WITH SURGICAL PREP AND PLACEMENT OF VAC   INCISION AND DRAINAGE OF WOUND N/A 05/08/2012   Procedure: IRRIGATION AND DEBRIDEMENT OF ABD WOUND SURGICAL PREP AND PLACEMENT OF VAC ;  Surgeon: Theodoro Kos, DO;  Location: [place];  Service: Plastics;  Laterality: N/A;  IRRIGATION AND DEBRIDEMENT OF ABD WOUND SURGICAL PREP AND PLACEMENT OF VAC    INCISION  AND DRAINAGE OF WOUND N/A 05/15/2012   Procedure: IRRIGATION AND DEBRIDEMENT OF ABDOMINAL WOUND WITH POSSIBLE SURGICAL PREP AND PLACEMENT OF VAC;  Surgeon: Theodoro Kos, DO;  Location: [place];  Service: Plastics;  Laterality: N/A;   INCISION AND DRAINAGE OF WOUND N/A 05/22/2012   Procedure: IRRIGATION AND DEBRIDEMENT OF ABODOMINAL WOUND WITH  SURGICAL PREP AND VAC PLACEMENT;  Surgeon: Theodoro Kos, DO;  Location: [place];  Service: Plastics;  Laterality: N/A;   INCISION AND DRAINAGE OF WOUND N/A 06/28/2012   Procedure: IRRIGATION AND DEBRIDEMENT OF ABDOMINAL ULCER SURGICAL PREP AND PLACEMENT OF ACELL AND VAC;  Surgeon: Theodoro Kos, DO;  Location: [place];  Service: Plastics;  Laterality: N/A;   INCISIONAL HERNIA REPAIR  02/07/2012   Procedure: HERNIA REPAIR INCISIONAL;  Surgeon: Gayland Curry, MD,FACS;  Location: [place];  Service: General;;  Open, Primary repair, strangulated Incisional hernia.   LESION REMOVAL  04/17/2012   Procedure: LESION REMOVAL;  Surgeon: Theodoro Kos, DO;  Location: [place];  Service: Plastics;  Laterality: N/A;   CENTER OF FOREHEAD, REMOVAL FORHEAD SKIN LESION   ORIF TOE  FRACTURE Left 02/09/2017   Procedure: EXCISION TALAR HEAD, INTERNAL FIXATION MEDIAL COLUMN LEFT FOOT;  Surgeon: Newt Minion, MD;  Location: [place];  Service: Orthopedics;  Laterality: Left;   POLYPECTOMY N/A 05/14/2014   Procedure: ENDOMETRIAL POLYPECTOMY;  Surgeon: Jonnie Kind, MD;  Location: [facility];  Service: Gynecology;  Laterality: N/A;   STUMP REVISION Left 08/31/2017   Procedure: REVISION LEFT BELOW KNEE AMPUTATION;  Surgeon: Newt Minion, MD;  Location: [place];  Service: Orthopedics;  Laterality: Left;   Family History  Problem Relation Age of Onset   Heart attack Mother    Hypertension Mother    Heart disease Mother    Sudden death Mother    Obesity Mother    Hypertension Sister    Diabetes Brother    Birth defects Son        heart defect   Social History   Socioeconomic History   Marital status: Married    Spouse name: Not on file   Number of children: 5   Years of education: Not on file   Highest education level: Not on file  Occupational History   Occupation: Estate manager/land agent    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 5 children. One of whom has pulmonary stenosis.    Left BKA, uses crutches - doesn't get much exercise   Social Determinants of Health   Financial Resource Strain: Low Risk    Difficulty of Paying Living Expenses: Not hard at all  Food Insecurity: No Food Insecurity   Worried About Charity fundraiser in the Last Year: Never true   Ran Out of Food in the Last Year:  Never true  Transportation Needs: No Transportation Needs  Lack of Transportation (Medical): No   Lack of Transportation (Non-Medical): No  Physical Activity: Insufficiently Active   Days of Exercise per Week: 7 days   Minutes of Exercise per Session: 10 min  Stress: No Stress Concern Present   Feeling of Stress : Not at all  Social Connections: Moderately Isolated   Frequency of Communication with Friends and Family: More than three times a week   Frequency of Social Gatherings with Friends and Family: Twice a week   Attends Religious Services: Never   Marine scientist or Organizations: No   Attends Music therapist: Never   Marital Status: Married    Tobacco Counseling Counseling given: Not Answered   Clinical Intake:  Pre-visit preparation completed: Yes  Pain : No/denies pain Pain Score: 0-No pain     BMI - recorded: 45.03 Nutritional Status: BMI > 30  Obese Nutritional Risks: None Diabetes: No  How often do you need to have someone help you when you read instructions, pamphlets, or other written materials from your doctor or pharmacy?: 1 - Never  Diabetic? Pre-diabetes.  Interpreter Needed?: No  Information entered by :: Naama Sappington, LPN   Activities of Daily Living In your present state of health, do you have any difficulty performing the following activities: 10/01/2020 09/09/2020  Hearing? N N  Vision? N N  Difficulty concentrating or making decisions? N N  Walking or climbing stairs? Y Y  Dressing or bathing? N N  Doing errands, shopping? Y N  Preparing Food and eating ? N -  Using the Toilet? N -  In the past six months, have you accidently leaked urine? Y -  Comment wears pads for protection -  Do you have problems with loss of bowel control? N -  Managing your Medications? N -  Managing your Finances? N -  Housekeeping or managing your Housekeeping? Y -  Some recent data might be hidden    Patient Care Team: [name], MD as PCP - General (Family Medicine) [name], MD as PCP - Cardiology (Cardiology)  Indicate any recent Medical Services you may have received from other than [facility] providers in the past year (date may be approximate).     Assessment:   This is a routine wellness examination for Kathyjo.  Hearing/Vision screen Hearing Screening - Comments:: Denies hearing difficulties  Vision Screening - Comments:: Denies vision difficulties - up to date with annual eye exam - [employer] sends someone to her house annually for eye exams  Dietary issues and exercise activities discussed: Current Exercise Habits: Home exercise routine, Type of exercise: walking, Time (Minutes): 10, Frequency (Times/Week): 7, Weekly Exercise (Minutes/Week): 70, Intensity: Mild, Exercise limited by: orthopedic condition(s);cardiac condition(s)   Goals Addressed             This Visit's Progress    Exercise 3x per week (30 min per time)   Not on track      Depression Screen PHQ 2/9 Scores 10/01/2020 05/27/2020 02/19/2020 08/16/2019 12/18/2018 04/11/2018 09/19/2017  PHQ - 2 Score 0 2 0 0 0 0 2  PHQ- 9 Score - 8 - - - - 6    Fall Risk Fall Risk  10/01/2020 09/25/2020 09/25/2020 08/19/2020 02/19/2020  Falls in the past year? 1 1 0 0 0  Number falls in past yr: 0 0 - - -  Injury with Fall? 0 0 - - -  Risk for fall due to : History of fall(s);Impaired mobility;Orthopedic patient History of fall(s);Impaired  mobility - - -  Follow up Falls prevention discussed;Education provided Falls evaluation completed - - Falls evaluation completed    FALL RISK PREVENTION PERTAINING TO THE HOME:  Any stairs in or around the home? No  If so, are there any without handrails? No  Home free of loose throw rugs in walkways, pet beds, electrical cords, etc? Yes  Adequate lighting in your home to reduce risk of falls? Yes   ASSISTIVE DEVICES UTILIZED TO PREVENT FALLS:  Life alert? No  Use of a cane, walker or w/c? Yes  Grab bars in the  bathroom? Yes  Shower chair or bench in shower? Yes  Elevated toilet seat or a handicapped toilet? Yes   TIMED UP AND GO:  Was the test performed? No . Telephonic visit.  Cognitive Function:     6CIT Screen 10/01/2020  What Year? 0 points  What month? 0 points  What time? 0 points  Count back from 20 0 points  Months in reverse 0 points  Repeat phrase 0 points  Total Score 0    Immunizations Immunization History  Administered Date(s) Administered   Fluad Quad(high Dose 65+) 12/18/2018, 12/10/2019   Influenza,inj,Quad PF,6+ Mos 01/12/2013, 01/09/2016, 12/10/2016, 01/25/2018   Influenza-Unspecified 12/10/2016, 12/18/2018   PFIZER(Purple Top)SARS-COV-2 Vaccination 10/20/2019, 11/16/2019   Pneumococcal Polysaccharide-23 09/10/2020   Tdap 12/17/2010    TDAP status: Up to date  Flu Vaccine status: Up to date  Pneumococcal vaccine status: Up to date  Covid-19 vaccine status: Completed vaccines  Qualifies for Shingles Vaccine? Yes   Zostavax completed No   Shingrix Completed?: No.    Education has been provided regarding the importance of this vaccine. Patient has been advised to call insurance company to determine out of pocket expense if they have not yet received this vaccine. Advised may also receive vaccine at local pharmacy or Health Dept. Verbalized acceptance and understanding.  Screening Tests Health Maintenance  Topic Date Due   FOOT EXAM  Never done   OPHTHALMOLOGY EXAM  Never done   COLONOSCOPY (Pts 45-29yrs Insurance coverage will need to be confirmed)  Never done   COLON CANCER SCREENING ANNUAL FOBT  03/26/2015   MAMMOGRAM  02/01/2018   DEXA SCAN  Never done   COVID-19 Vaccine (3 - Booster for Pfizer series) 04/17/2020   Zoster Vaccines- Shingrix (1 of 2) 11/19/2020 (Originally 04/19/2003)   INFLUENZA VACCINE  10/13/2020   HEMOGLOBIN A1C  11/27/2020   TETANUS/TDAP  12/16/2020   PNA vac Low Risk Adult (2 of 2 - PCV13) 09/10/2021   Hepatitis C Screening   Completed   HPV VACCINES  Aged Out    Health Maintenance  Health Maintenance Due  Topic Date Due   FOOT EXAM  Never done   OPHTHALMOLOGY EXAM  Never done   COLONOSCOPY (Pts 45-13yrs Insurance coverage will need to be confirmed)  Never done   COLON CANCER SCREENING ANNUAL FOBT  03/26/2015   MAMMOGRAM  02/01/2018   DEXA SCAN  Never done   COVID-19 Vaccine (3 - Booster for [place] series) 04/17/2020    Colorectal cancer screening: Ordered Cologuard today  Mammogram status: Ordered 10/01/2020. Pt provided with contact info and advised to call to schedule appt.   Bone Density status: Ordered 10/01/2020. Pt provided with contact info and advised to call to schedule appt.  Lung Cancer Screening: (Low Dose CT Chest recommended if Age 61-80 years, 30 pack-year currently smoking OR have quit w/in 15years.) does not qualify.   Additional Screening:  Hepatitis  C Screening: does qualify; Completed 12/18/2018  Vision Screening: Recommended annual ophthalmology exams for early detection of glaucoma and other disorders of the eye. Is the patient up to date with their annual eye exam?  Yes  Who is the provider or what is the name of the office in which the patient attends annual eye exams? [facility] If pt is not established with a provider, would they like to be referred to a provider to establish care? No .   Dental Screening: Recommended annual dental exams for proper oral hygiene  Community Resource Referral / Chronic Care Management: CRR required this visit?  No   CCM required this visit?  No      Plan:     I have personally reviewed and noted the following in the patient's chart:   Medical and social history Use of alcohol, tobacco or illicit drugs  Current medications and supplements including opioid prescriptions. Patient is not currently taking opioid prescriptions. Functional ability and status Nutritional status Physical activity Advanced directives List of other  physicians Hospitalizations, surgeries, and ER visits in previous 12 months Vitals Screenings to include cognitive, depression, and falls Referrals and appointments  In addition, I have reviewed and discussed with patient certain preventive protocols, quality metrics, and best practice recommendations. A written personalized care plan for preventive services as well as general preventive health recommendations were provided to patient.     Sandrea Hammond, LPN   08/09/7822   Nurse Notes: None

## 2020-10-01 NOTE — Patient Instructions (Signed)
Julia West , Thank you for taking time to come for your Medicare Wellness Visit. I appreciate your ongoing commitment to your health goals. Please review the following plan we discussed and let me know if I can assist you in the future.   Screening recommendations/referrals: Colonoscopy: Ordered Cologuard today Mammogram: Ordered mammogram to be done in [place] after 11/2020 Bone Density: Ordered to be done with mammogram after 11/2020 Recommended yearly ophthalmology/optometry visit for glaucoma screening and checkup Recommended yearly dental visit for hygiene and checkup  Vaccinations: Influenza vaccine: Done 12/10/2019 - Repeat annually  Pneumococcal vaccine: Done 09/10/2020 - can't find record of Prevnar  Tdap vaccine: Done 12/17/2010 - Repeat in 10 years  Shingles vaccine: Due. Shingrix discussed. Please contact your pharmacy for coverage information.     Covid-19:Done 10/20/19 & 11/16/19  Advanced directives: Advance directive discussed with you today. Even though you declined this today, please call our office should you change your mind, and we can give you the proper paperwork for you to fill out.   Conditions/risks identified: Aim for 30 minutes of exercise each day - seated exercises can be beneficial to your strength and endurance - arm and leg lifts - light weights; drink 6-8 glasses of water and eat lots of fruits and vegetables.   Next appointment: Follow up in one year for your annual wellness visit    Preventive Care 65 Years and Older, Female Preventive care refers to lifestyle choices and visits with your health care provider that can promote health and wellness. What does preventive care include? A yearly physical exam. This is also called an annual well check. Dental exams once or twice a year. Routine eye exams. Ask your health care provider how often you should have your eyes checked. Personal lifestyle choices, including: Daily care of your teeth and gums. Regular  physical activity. Eating a healthy diet. Avoiding tobacco and drug use. Limiting alcohol use. Practicing safe sex. Taking low-dose aspirin every day. Taking vitamin and mineral supplements as recommended by your health care provider. What happens during an annual well check? The services and screenings done by your health care provider during your annual well check will depend on your age, overall health, lifestyle risk factors, and family history of disease. Counseling  Your health care provider may ask you questions about your: Alcohol use. Tobacco use. Drug use. Emotional well-being. Home and relationship well-being. Sexual activity. Eating habits. History of falls. Memory and ability to understand (cognition). Work and work Statistician. Reproductive health. Screening  You may have the following tests or measurements: Height, weight, and BMI. Blood pressure. Lipid and cholesterol levels. These may be checked every 5 years, or more frequently if you are over 67 years old. Skin check. Lung cancer screening. You may have this screening every year starting at age 48 if you have a 30-pack-year history of smoking and currently smoke or have quit within the past 15 years. Fecal occult blood test (FOBT) of the stool. You may have this test every year starting at age 61. Flexible sigmoidoscopy or colonoscopy. You may have a sigmoidoscopy every 5 years or a colonoscopy every 10 years starting at age 47. Hepatitis C blood test. Hepatitis B blood test. Sexually transmitted disease (STD) testing. Diabetes screening. This is done by checking your blood sugar (glucose) after you have not eaten for a while (fasting). You may have this done every 1-3 years. Bone density scan. This is done to screen for osteoporosis. You may have this done starting at  age 75. Mammogram. This may be done every 1-2 years. Talk to your health care provider about how often you should have regular mammograms. Talk  with your health care provider about your test results, treatment options, and if necessary, the need for more tests. Vaccines  Your health care provider may recommend certain vaccines, such as: Influenza vaccine. This is recommended every year. Tetanus, diphtheria, and acellular pertussis (Tdap, Td) vaccine. You may need a Td booster every 10 years. Zoster vaccine. You may need this after age 20. Pneumococcal 13-valent conjugate (PCV13) vaccine. One dose is recommended after age 52. Pneumococcal polysaccharide (PPSV23) vaccine. One dose is recommended after age 71. Talk to your health care provider about which screenings and vaccines you need and how often you need them. This information is not intended to replace advice given to you by your health care provider. Make sure you discuss any questions you have with your health care provider. Document Released: 03/28/2015 Document Revised: 11/19/2015 Document Reviewed: 12/31/2014 Elsevier Interactive Patient Education  2017 [place] Prevention in the Home Falls can cause injuries. They can happen to people of all ages. There are many things you can do to make your home safe and to help prevent falls. What can I do on the outside of my home? Regularly fix the edges of walkways and driveways and fix any cracks. Remove anything that might make you trip as you walk through a door, such as a raised step or threshold. Trim any bushes or trees on the path to your home. Use bright outdoor lighting. Clear any walking paths of anything that might make someone trip, such as rocks or tools. Regularly check to see if handrails are loose or broken. Make sure that both sides of any steps have handrails. Any raised decks and porches should have guardrails on the edges. Have any leaves, snow, or ice cleared regularly. Use sand or salt on walking paths during winter. Clean up any spills in your garage right away. This includes oil or grease  spills. What can I do in the bathroom? Use night lights. Install grab bars by the toilet and in the tub and shower. Do not use towel bars as grab bars. Use non-skid mats or decals in the tub or shower. If you need to sit down in the shower, use a plastic, non-slip stool. Keep the floor dry. Clean up any water that spills on the floor as soon as it happens. Remove soap buildup in the tub or shower regularly. Attach bath mats securely with double-sided non-slip rug tape. Do not have throw rugs and other things on the floor that can make you trip. What can I do in the bedroom? Use night lights. Make sure that you have a light by your bed that is easy to reach. Do not use any sheets or blankets that are too big for your bed. They should not hang down onto the floor. Have a firm chair that has side arms. You can use this for support while you get dressed. Do not have throw rugs and other things on the floor that can make you trip. What can I do in the kitchen? Clean up any spills right away. Avoid walking on wet floors. Keep items that you use a lot in easy-to-reach places. If you need to reach something above you, use a strong step stool that has a grab bar. Keep electrical cords out of the way. Do not use floor polish or wax that makes floors  slippery. If you must use wax, use non-skid floor wax. Do not have throw rugs and other things on the floor that can make you trip. What can I do with my stairs? Do not leave any items on the stairs. Make sure that there are handrails on both sides of the stairs and use them. Fix handrails that are broken or loose. Make sure that handrails are as long as the stairways. Check any carpeting to make sure that it is firmly attached to the stairs. Fix any carpet that is loose or worn. Avoid having throw rugs at the top or bottom of the stairs. If you do have throw rugs, attach them to the floor with carpet tape. Make sure that you have a light switch at the  top of the stairs and the bottom of the stairs. If you do not have them, ask someone to add them for you. What else can I do to help prevent falls? Wear shoes that: Do not have high heels. Have rubber bottoms. Are comfortable and fit you well. Are closed at the toe. Do not wear sandals. If you use a stepladder: Make sure that it is fully opened. Do not climb a closed stepladder. Make sure that both sides of the stepladder are locked into place. Ask someone to hold it for you, if possible. Clearly mark and make sure that you can see: Any grab bars or handrails. First and last steps. Where the edge of each step is. Use tools that help you move around (mobility aids) if they are needed. These include: Canes. Walkers. Scooters. Crutches. Turn on the lights when you go into a dark area. Replace any light bulbs as soon as they burn out. Set up your furniture so you have a clear path. Avoid moving your furniture around. If any of your floors are uneven, fix them. If there are any pets around you, be aware of where they are. Review your medicines with your doctor. Some medicines can make you feel dizzy. This can increase your chance of falling. Ask your doctor what other things that you can do to help prevent falls. This information is not intended to replace advice given to you by your health care provider. Make sure you discuss any questions you have with your health care provider. Document Released: 12/26/2008 Document Revised: 08/07/2015 Document Reviewed: 04/05/2014 Elsevier Interactive Patient Education  2017 [employer].

## 2020-10-02 LAB — TOXASSURE SELECT 13 (MW), URINE

## 2020-10-07 ENCOUNTER — Encounter: Payer: Self-pay | Admitting: Family Medicine

## 2020-10-09 ENCOUNTER — Other Ambulatory Visit: Payer: Self-pay

## 2020-10-09 MED ORDER — MAGNESIUM OXIDE 400 MG PO TABS: 400.0000 mg | ORAL_TABLET | Freq: Every day | ORAL | 3 refills | Status: DC

## 2020-10-09 MED ORDER — ATORVASTATIN CALCIUM 20 MG PO TABS: 20.0000 mg | ORAL_TABLET | Freq: Every day | ORAL | 3 refills | Status: DC

## 2020-10-09 MED ORDER — VALSARTAN 160 MG PO TABS: 160.0000 mg | ORAL_TABLET | Freq: Every day | ORAL | 3 refills | Status: DC

## 2020-10-13 ENCOUNTER — Other Ambulatory Visit: Payer: Self-pay

## 2020-10-13 MED ORDER — APIXABAN 5 MG PO TABS: 5.0000 mg | ORAL_TABLET | Freq: Two times a day (BID) | ORAL | 1 refills | Status: DC

## 2020-10-13 NOTE — Telephone Encounter (Signed)
Prescription refill request for Eliquis received. Indication:AFIB Last office visit:08/20/20 HOCHREIN Scr:0.91 09/19/20 Age: 65F Weight:128KG

## 2020-10-14 ENCOUNTER — Other Ambulatory Visit: Payer: Self-pay | Admitting: Family Medicine

## 2020-10-14 MED ORDER — BELSOMRA 20 MG PO TABS: 20.0000 mg | ORAL_TABLET | Freq: Every evening | ORAL | 2 refills | Status: DC | PRN

## 2020-10-16 ENCOUNTER — Ambulatory Visit (INDEPENDENT_AMBULATORY_CARE_PROVIDER_SITE_OTHER): Payer: Medicare Other | Admitting: Pharmacist

## 2020-10-16 ENCOUNTER — Telehealth: Payer: Self-pay

## 2020-10-16 ENCOUNTER — Telehealth: Payer: Self-pay | Admitting: Pharmacist

## 2020-10-16 ENCOUNTER — Other Ambulatory Visit: Payer: Self-pay | Admitting: Family Medicine

## 2020-10-16 DIAGNOSIS — I4891 Unspecified atrial fibrillation: Secondary | ICD-10-CM

## 2020-10-16 DIAGNOSIS — E785 Hyperlipidemia, unspecified: Secondary | ICD-10-CM

## 2020-10-16 MED ORDER — ZOLPIDEM TARTRATE 5 MG PO TABS: 5.0000 mg | ORAL_TABLET | Freq: Every evening | ORAL | 2 refills | Status: DC | PRN

## 2020-10-16 MED ORDER — FENOFIBRATE 160 MG PO TABS: 160.0000 mg | ORAL_TABLET | Freq: Every day | ORAL | 3 refills | Status: DC

## 2020-10-16 MED ORDER — VALSARTAN 160 MG PO TABS: 160.0000 mg | ORAL_TABLET | Freq: Every day | ORAL | 3 refills | Status: DC

## 2020-10-16 MED ORDER — SERTRALINE HCL 50 MG PO TABS: 50.0000 mg | ORAL_TABLET | Freq: Every day | ORAL | 1 refills | Status: DC

## 2020-10-16 MED ORDER — ATORVASTATIN CALCIUM 20 MG PO TABS: 20.0000 mg | ORAL_TABLET | Freq: Every day | ORAL | 3 refills | Status: DC

## 2020-10-16 NOTE — Progress Notes (Signed)
Chronic Care Management Pharmacy Note  10/16/2020 Name:  Julia West MRN:  [id number] DOB:  [birth year]  Summary: MED MANAGEMENT  Recommendations/Changes made from today's visit: Atrial Fibrillation: controlled; current rate/rhythm control;TIKOSYN anticoagulant treatment: ELIQUIS CHADS2VASc score: 4 Home blood pressure, heart rate readings:  150S/70S Assessed patient finances. PATIENT HAS NOT MET THE 3% OUT OF POCKET SPEND TO QUALIFY FOR BMS PATIENT ASSISTANCE PROGRAM (ELIQUIS)  MEDICATION ASSISTANCE: [facility] PATIENT REQUEST (ATORVASTATIN, FENOFIBRATE, VALSARTAN, SERTRALINE INSOMNIA: UNCONTROLLED AND DIFFICULT TO CHOOSE MEDICATION THAT DOESN'T INTERACT WITH TIKOSYN-->BELSOMRA WAS PRESCRIBED, HOWEVER PATIENT CAN'T AFFORD  PCP TO SEND IN AMBIEN SHORT TERM  APPLICATION FOR BELSOMRA FILLED OUT AND SENT TO [facility] FOR LONG TERM TREATMENT (THIS PROGRAM TAKES ABOUT 2-3 MONTHS TO RECEIVE MEDICAITON)  Follow Up Plan: Telephone follow up appointment with care management team member scheduled for: 2 MONTHS  Subjective: Julia West is an 65 y.o. year old female who is a primary patient of Stacks, Cletus Gash, MD.  The CCM team was consulted for assistance with disease management and care coordination needs.    Engaged with patient by telephone for initial visit in response to provider referral for pharmacy case management and/or care coordination services.   Consent to Services:  The patient was given information about Chronic Care Management services, agreed to services, and gave verbal consent prior to initiation of services.  Please see initial visit note for detailed documentation.   Patient Care Team: [name], MD as PCP - General (Family Medicine) [name], MD as PCP - Cardiology (Cardiology) Lavera Guise, [facility] as Pharmacist (Family Medicine)   Objective:  Lab Results  Component Value Date   CREATININE 0.91 09/19/2020    CREATININE 0.96 09/14/2020   CREATININE 0.74 09/13/2020    Lab Results  Component Value Date   HGBA1C 5.7 (H) 05/27/2020   Last diabetic Eye exam: No results found for: HMDIABEYEEXA  Last diabetic Foot exam: No results found for: HMDIABFOOTEX      Component Value Date/Time   CHOL 144 08/19/2020 0909   TRIG 107 08/19/2020 0909   TRIG 247 (H) 09/12/2013 1137   HDL 41 08/19/2020 0909   HDL 35 (L) 09/12/2013 1137   CHOLHDL 3.5 08/19/2020 0909   LDLCALC 83 08/19/2020 0909   LDLCALC 115 (H) 09/12/2013 1137    Hepatic Function Latest Ref Rng & Units 08/19/2020 05/27/2020 02/19/2020  Total Protein 6.0 - 8.5 g/dL 6.7 7.3 6.9  Albumin 3.8 - 4.8 g/dL 4.3 4.5 4.3  AST 0 - 40 IU/L $Remov'13 20 13  'FVDgqH$ ALT 0 - 32 IU/L $Remov'8 9 11  'HSOdPM$ Alk Phosphatase 44 - 121 IU/L 42(L) 42(L) 44  Total Bilirubin 0.0 - 1.2 mg/dL 0.4 0.5 0.4    Lab Results  Component Value Date/Time   TSH 0.499 05/27/2020 11:10 AM   TSH 0.280 (L) 05/07/2017 05:04 AM   TSH 0.414 (L) 12/09/2014 08:49 AM   FREET4 1.16 (H) 05/09/2017 04:57 AM   FREET4 1.25 12/09/2014 08:49 AM    CBC Latest Ref Rng & Units 08/19/2020 07/11/2020 05/27/2020  WBC 3.4 - 10.8 x10E3/uL 11.8(H) 11.8(H) 11.8(H)  Hemoglobin 11.1 - 15.9 g/dL 15.7 15.6(H) 15.9  Hematocrit 34.0 - 46.6 % 47.5(H) 48.8(H) 48.1(H)  Platelets 150 - 450 x10E3/uL 287 268 287    Lab Results  Component Value Date/Time   VD25OH 15.7 (L) 05/27/2020 11:10 AM   VD25OH 25.7 (L) 12/10/2016 12:03 PM    Clinical ASCVD: Yes  The 10-year  ASCVD risk score Mikey Bussing DC Jr., et al., 2013) is: 23.9%   Values used to calculate the score:     Age: 61 years     Sex: Female     Is Non-Hispanic African American: No     Diabetic: Yes     Tobacco smoker: No     Systolic Blood Pressure: 841 mmHg     Is BP treated: Yes     HDL Cholesterol: 41 mg/dL     Total Cholesterol: 144 mg/dL    Other: (CHADS2VASc if Afib, PHQ9 if depression, MMRC or CAT for COPD, ACT, DEXA)  Social History   Tobacco Use  Smoking  Status Former   Packs/day: 1.00   Years: 25.00   Pack years: 25.00   Types: Cigarettes   Quit date: 11/17/2011   Years since quitting: 8.9  Smokeless Tobacco Never   BP Readings from Last 3 Encounters:  09/25/20 (!) 158/76  09/19/20 (!) 156/56  09/14/20 (!) 167/65   Pulse Readings from Last 3 Encounters:  09/25/20 (!) 50  09/19/20 (!) 48  09/14/20 (!) 58   Wt Readings from Last 3 Encounters:  10/01/20 279 lb (126.6 kg)  09/25/20 282 lb 3.2 oz (128 kg)  09/19/20 279 lb 6.4 oz (126.7 kg)    Assessment: Review of patient past medical history, allergies, medications, health status, including review of consultants reports, laboratory and other test data, was performed as part of comprehensive evaluation and provision of chronic care management services.   SDOH:  (Social Determinants of Health) assessments and interventions performed:    CCM Care Plan  Allergies  Allergen Reactions   Ace Inhibitors Cough    Medications Reviewed Today     Reviewed by Lavera Guise, [facility] (Pharmacist) on 10/16/20 at [place] List Status: <None>   Medication Order Taking? Sig Documenting Provider Last Dose Status Informant  apixaban (ELIQUIS) 5 MG TABS tablet [id number]  Take 1 tablet (5 mg total) by mouth 2 (two) times daily. [name], MD  Active   atorvastatin (LIPITOR) 20 MG tablet [id number]  Take 1 tablet (20 mg total) by mouth daily. [name], MD  Active   dofetilide [facility]) 125 MCG capsule [id number]  Take 1 capsule (125 mcg total) by mouth 2 (two) times daily. [name], MD  Active   fenofibrate 160 MG tablet [id number] No Take 1 tablet (160 mg total) by mouth daily. [name], MD Taking Active   fexofenadine [facility]) 180 MG tablet [id number] No Take 1 tablet (180 mg total) by mouth daily. For allergy symptoms [name], MD Taking Active   fluticasone [facility]) 50 MCG/ACT nasal spray [id number] No Place 1 spray into both nostrils daily as needed for allergies or  rhinitis. [provider] Taking Active Self  magnesium oxide (MAG-OX) 400 MG tablet [id number]  Take 1 tablet (400 mg total) by mouth daily. [name], MD  Active   nitroGLYCERIN (NITROSTAT) 0.4 MG SL tablet [id number] No Place 1 tablet (0.4 mg total) under the tongue every 5 (five) minutes x 3 doses as needed for chest pain. If pain persist after call 911 [name], MD Taking Expired 09/19/20 2359 Self  sertraline (ZOLOFT) 50 MG tablet [id number] No Take 1 tablet (50 mg total) by mouth daily. [name], MD Taking Active   Suvorexant (BELSOMRA) 10 MG TABS [id number]  Take 10 mg by mouth at bedtime. [name], MD  Active   Suvorexant [facility]) 20 MG TABS [id number]  Take 20 mg by mouth  at bedtime as needed. [name], MD  Active   valsartan (DIOVAN) 160 MG tablet [id number]  Take 1 tablet (160 mg total) by mouth daily. [name], MD  Active             Patient Active Problem List   Diagnosis Date Noted   Secondary hypercoagulable state [facility]) 09/01/2020   Coronary artery disease involving native coronary artery of native heart without angina pectoris 07/08/2020   Persistent atrial fibrillation ([place]) 07/08/2020   Prediabetes 07/01/2020   Vitamin D deficiency 07/01/2020   Class 3 severe obesity with serious comorbidity and body mass index (BMI) of 50.0 to 59.9 in adult ([place]) 07/01/2020   Dyslipidemia 11/23/2017   S/P BKA (below knee amputation), left ([place]) 08/31/2017   Dehiscence of amputation stump ([place]) 08/29/2017   Infection of amputation stump, left lower extremity ([place]) 06/19/2017   Bradycardia 05/07/2017   Depression 05/07/2017   Lymphedema 05/07/2017   Post-menopausal bleeding 04/15/2014   Endometrial polyp 04/09/2014   Ventral hernia 03/06/2012   Open abdominal wall wound 03/06/2012   Hyperlipemia 11/12/2008   OBESITY, MORBID 11/12/2008   Essential hypertension 11/12/2008   CAD (coronary artery disease) 11/12/2008    Immunization  History  Administered Date(s) Administered   Fluad Quad(high Dose 65+) 12/18/2018, 12/10/2019   Influenza,inj,Quad PF,6+ Mos 01/12/2013, 01/09/2016, 12/10/2016, 01/25/2018   Influenza-Unspecified 12/10/2016, 12/18/2018   PFIZER(Purple Top)SARS-COV-2 Vaccination 10/20/2019, 11/16/2019   Pneumococcal Polysaccharide-23 09/10/2020   Tdap 12/17/2010    Conditions to be addressed/monitored: Atrial Fibrillation  Care Plan : PHARMD MEDICATION MANAGEMENT  Updates made by Lavera Guise, [place] since 10/16/2020 12:00 AM     Problem: DISEASE PROGRESSION PREVENTION      Long-Range Goal: AFIB, INSOMNIA   This Visit's Progress: Not on track  Priority: High  Note:   Current Barriers:  Unable to independently afford treatment regimen Suboptimal therapeutic regimen for INSOMNIA  Pharmacist Clinical Goal(s):  Over the next 90 days, patient will verbalize ability to afford treatment regimen through collaboration with PharmD and provider.   Interventions: 1:1 collaboration with [name], MD regarding development and update of comprehensive plan of care as evidenced by provider attestation and co-signature Inter-disciplinary care team collaboration (see longitudinal plan of care) Comprehensive medication review performed; medication list updated in electronic medical record  Atrial Fibrillation: controlled; current rate/rhythm control;TIKOSYN anticoagulant treatment: ELIQUIS CHADS2VASc score: 4 Home blood pressure, heart rate readings:  150S/70S Assessed patient finances. PATIENT HAS NOT MET THE 3% OUT OF POCKET SPEND TO QUALIFY FOR BMS PATIENT ASSISTANCE PROGRAM (ELIQUIS)  MEDICATION ASSISTANCE: [facility] PER PATIENT REQUEST (ATORVASTATIN, FENOFIBRATE, VALSARTAN, SERTRALINE INSOMNIA: UNCONTROLLED AND DIFFICULT TO CHOOSE MEDICATION THAT DOESN'T INTERACT WITH TIKOSYN-->BELSOMRA WAS PRESCRIBED, HOWEVER PATIENT CAN'T AFFORD  PCP TO SEND IN AMBIEN SHORT  TERM  APPLICATION FOR BELSOMRA FILLED OUT AND SENT TO [facility] FOR LONG TERM TREATMENT (THIS PROGRAM TAKES ABOUT 2-3 MONTHS TO RECEIVE MEDICAITON)  Patient Goals/Self-Care Activities Over the next 90 days, patient will:  - take medications as prescribed  Follow Up Plan: Telephone follow up appointment with care management team member scheduled for: 2 MONTHS      Medication Assistance: Application for [facility]/BEL[facility]  medication assistance program. in process.  Anticipated assistance start date TBD.  See plan of care for additional detail.  Patient's preferred pharmacy is:  [id number] [place], [address] [address] Phone: [phone] Fax: [phone]  [facility]  [facility]  Delivery) - [address], [address] [place] [address] Phone: [phone] Fax: [place], [address] [place] [address] Phone: [phone] Fax: [phone]   Follow Up:  Patient agrees to Care Plan and Follow-up.  Plan: Telephone follow up appointment with care management team member scheduled for:  2 MONTHS   Regina Eck, PharmD, BCPS Clinical Pharmacist, [place]  II Phone [phone]

## 2020-10-16 NOTE — Patient Instructions (Signed)
Visit Information  PATIENT GOALS:  Goals Addressed               This Visit's Progress     Patient Stated     AFIB, INSOMNIA (pt-stated)        Current Barriers:  Unable to independently afford treatment regimen Suboptimal therapeutic regimen for INSOMNIA  Pharmacist Clinical Goal(s):  Over the next 90 days, patient will verbalize ability to afford treatment regimen through collaboration with PharmD and provider.   Interventions: 1:1 collaboration with [name], MD regarding development and update of comprehensive plan of care as evidenced by provider attestation and co-signature Inter-disciplinary care team collaboration (see longitudinal plan of care) Comprehensive medication review performed; medication list updated in electronic medical record  Atrial Fibrillation: controlled; current rate/rhythm control;TIKOSYN anticoagulant treatment: ELIQUIS CHADS2VASc score: 4 Home blood pressure, heart rate readings:  150S/70S Assessed patient finances. PATIENT HAS NOT MET THE 3% OUT OF POCKET SPEND TO QUALIFY FOR BMS PATIENT ASSISTANCE PROGRAM (ELIQUIS)  MEDICATION ASSISTANCE: [facility] PER PATIENT REQUEST (ATORVASTATIN, FENOFIBRATE, VALSARTAN, SERTRALINE INSOMNIA: UNCONTROLLED AND DIFFICULT TO CHOOSE MEDICATION THAT DOESN'T INTERACT WITH TIKOSYN-->BELSOMRA WAS PRESCRIBED, HOWEVER PATIENT CAN'T AFFORD  PCP TO SEND IN AMBIEN SHORT TERM  APPLICATION FOR BELSOMRA FILLED OUT AND SENT TO [facility] FOR LONG TERM TREATMENT (THIS PROGRAM TAKES ABOUT 2-3 MONTHS TO RECEIVE MEDICAITON)  Patient Goals/Self-Care Activities Over the next 90 days, patient will:  - take medications as prescribed  Follow Up Plan: Telephone follow up appointment with care management team member scheduled for: 2 MONTHS         The patient verbalized understanding of instructions, educational materials, and care plan provided today and declined offer to receive copy of patient  instructions, educational materials, and care plan.   Telephone follow up appointment with care management team member scheduled for: 2 MONTHS  Signature Regina Eck, PharmD, BCPS Clinical Pharmacist, [place]  II Phone [phone]

## 2020-10-16 NOTE — Telephone Encounter (Signed)
Called patient to schedule mammo and  dxa -  left message to cb. MPulliam, RT(R)(BD)/CMA

## 2020-10-16 NOTE — Telephone Encounter (Signed)
Scrip sent! Thanks, Cletus Gash

## 2020-10-16 NOTE — Telephone Encounter (Signed)
REMINDER TO PCP TO CALL IN SHORT TERM RX FOR AMBIEN LOW DOSE WHILE WE AWAIT PATIENT ASSISTANCE FOR BELSOMRA (PATIENT CAN'T AFFORD)  PATIENT W[facility]

## 2020-10-22 ENCOUNTER — Other Ambulatory Visit: Payer: Self-pay

## 2020-10-22 ENCOUNTER — Encounter: Payer: Self-pay | Admitting: Internal Medicine

## 2020-10-22 ENCOUNTER — Ambulatory Visit: Payer: Medicare Other | Admitting: Internal Medicine

## 2020-10-22 VITALS — BP 110/80 | HR 50 | Ht 66.0 in | Wt 280.4 lb

## 2020-10-22 DIAGNOSIS — D6869 Other thrombophilia: Secondary | ICD-10-CM | POA: Diagnosis not present

## 2020-10-22 DIAGNOSIS — I2510 Atherosclerotic heart disease of native coronary artery without angina pectoris: Secondary | ICD-10-CM

## 2020-10-22 DIAGNOSIS — I10 Essential (primary) hypertension: Secondary | ICD-10-CM

## 2020-10-22 DIAGNOSIS — I4819 Other persistent atrial fibrillation: Secondary | ICD-10-CM

## 2020-10-22 NOTE — Patient Instructions (Addendum)
Medication Instructions:  Your physician recommends that you continue on your current medications as directed. Please refer to the Current Medication list given to you today.  Labwork: None ordered.  Testing/Procedures: None ordered.  Follow-Up: Your physician wants you to follow-up in: 6 months with the [facility]. They will contact you to schedule.    Any Other Special Instructions Will Be Listed Below (If Applicable).  If you need a refill on your cardiac medications before your next appointment, please call your pharmacy.

## 2020-10-22 NOTE — Progress Notes (Signed)
PCP: [name], MD Primary Cardiologist: Dr Percival Spanish Primary EP: Dr Rayann Heman  Julia West is a 65 y.o. female who presents today for routine electrophysiology followup.  Since her tikosyn load the patient reports doing reasonably well.   + fatigue.  She is tired during the day. Today, she denies symptoms of palpitations, chest pain, shortness of breath,  lower extremity edema, dizziness, presyncope, or syncope.  The patient is otherwise without complaint today.   Past Medical History:  Diagnosis Date   Acute myocardial infarction    with a ruptured plaque in the circumflex in 2003   Anxiety    Bilateral swelling of feet    Cancer (HCC)    basal cell face   Chest pain    Complication of anesthesia    states low O2 sats post-op 11/13, always slow to awaken   Degenerative joint disease    Dehiscence of amputation stump ([place])    left BKA   Depression    Dyspnea    with activity- car to house - steps   Fatty liver    Gallbladder problem    Heart disease    HTN (hypertension)    Hyperlipidemia LDL goal <70    Incarcerated hernia    Internal and external hemorrhoids without complication    Joint pain    Lymphedema    right side of body   Lymphedema of arm    right   Morbid obesity ([place])    Rheumatoid arthritis ([place])    Surgical wound, non healing ABDOMINAL    has wound vac @ 125 mm Hg   Ventral hernia    Past Surgical History:  Procedure Laterality Date   AMPUTATION Left 05/06/2017   Procedure: BELOW KNEE AMPUTATION;  Surgeon: Newt Minion, MD;  Location: [place];  Service: Orthopedics;  Laterality: Left;   APPLICATION OF WOUND VAC     BOWEL RESECTION  02/07/2012   Procedure: SMALL BOWEL RESECTION;  Surgeon: Gayland Curry, MD,FACS;  Location: [place];  Service: General;;   CARDIOVASCULAR STRESS TEST  01-17-2012  DR HOCHREIN   LOW RISK NUCLEAR TEST   CARDIOVERSION N/A 07/14/2020   Procedure: CARDIOVERSION;  Surgeon: Arnoldo Lenis, MD;  Location: [facility];   Service: Endoscopy;  Laterality: N/A;   CESAREAN SECTION     x 4 in remote past   CHOLECYSTECTOMY     CORONARY ARTERY BYPASS GRAFT  2003   by Dr. Servando Snare. LIMA to the LAD, free RIMA to the circumflex. Stress perfusion study December 2009 with no high-risk areas of ischemia. She has a well-preserved ejection fraction   HYSTEROSCOPY WITH D & C N/A 05/14/2014   Procedure: DILATATION AND CURETTAGE (no specimen); HYSTEROSCOPY;  Surgeon: Jonnie Kind, MD;  Location: [facility];  Service: Gynecology;  Laterality: N/A;   I & D EXTREMITY Left 06/21/2017   Procedure: IRRIGATION AND DEBRIDEMENT OF LEFT LEG AMPUTATION SITE;  Surgeon: Wallace Going, DO;  Location: [facility];  Service: Plastics;  Laterality: Left;   INCISION AND DRAINAGE OF WOUND  04/17/2012   Procedure: IRRIGATION AND DEBRIDEMENT WOUND;  Surgeon: Theodoro Kos, DO;  Location: [place];  Service: Plastics;  Laterality: N/A;  OF ABDOMINAL WOUND, SURGICAL PREP AND PLACEMENT OF VAC, REMOVAL FOREHEAD SKIN LESION   INCISION AND DRAINAGE OF WOUND N/A 04/24/2012   Procedure: IRRIGATION AND DEBRIDEMENT WOUND;  Surgeon: Theodoro Kos, DO;  Location: [place];  Service: Plastics;  Laterality: N/A;  I &  D ABDOMINAL WOUND WITH VAC AND ACELL   INCISION AND DRAINAGE OF WOUND N/A 05/01/2012   Procedure: IRRIGATION AND DEBRIDEMENT WOUND;  Surgeon: Theodoro Kos, DO;  Location: [place];  Service: Plastics;  Laterality: N/A;  WITH SURGICAL PREP AND PLACEMENT OF VAC   INCISION AND DRAINAGE OF WOUND N/A 05/08/2012   Procedure: IRRIGATION AND DEBRIDEMENT OF ABD WOUND SURGICAL PREP AND PLACEMENT OF VAC ;  Surgeon: Theodoro Kos, DO;  Location: [place];  Service: Plastics;  Laterality: N/A;  IRRIGATION AND DEBRIDEMENT OF ABD WOUND SURGICAL PREP AND PLACEMENT OF VAC    INCISION AND DRAINAGE OF WOUND N/A 05/15/2012   Procedure: IRRIGATION AND DEBRIDEMENT OF ABDOMINAL WOUND WITH POSSIBLE SURGICAL PREP  AND PLACEMENT OF VAC;  Surgeon: Theodoro Kos, DO;  Location: [place];  Service: Plastics;  Laterality: N/A;   INCISION AND DRAINAGE OF WOUND N/A 05/22/2012   Procedure: IRRIGATION AND DEBRIDEMENT OF ABODOMINAL WOUND WITH  SURGICAL PREP AND VAC PLACEMENT;  Surgeon: Theodoro Kos, DO;  Location: [facility];  Service: Plastics;  Laterality: N/A;   INCISION AND DRAINAGE OF WOUND N/A 06/28/2012   Procedure: IRRIGATION AND DEBRIDEMENT OF ABDOMINAL ULCER SURGICAL PREP AND PLACEMENT OF ACELL AND VAC;  Surgeon: Theodoro Kos, DO;  Location: [place];  Service: Plastics;  Laterality: N/A;   INCISIONAL HERNIA REPAIR  02/07/2012   Procedure: HERNIA REPAIR INCISIONAL;  Surgeon: Gayland Curry, MD,FACS;  Location: [place];  Service: General;;  Open, Primary repair, strangulated Incisional hernia.   LESION REMOVAL  04/17/2012   Procedure: LESION REMOVAL;  Surgeon: Theodoro Kos, DO;  Location: [place];  Service: Plastics;  Laterality: N/A;   CENTER OF FOREHEAD, REMOVAL FORHEAD SKIN LESION   ORIF TOE FRACTURE Left 02/09/2017   Procedure: EXCISION TALAR HEAD, INTERNAL FIXATION MEDIAL COLUMN LEFT FOOT;  Surgeon: Newt Minion, MD;  Location: [place];  Service: Orthopedics;  Laterality: Left;   POLYPECTOMY N/A 05/14/2014   Procedure: ENDOMETRIAL POLYPECTOMY;  Surgeon: Jonnie Kind, MD;  [facility];  Service: Gynecology;  Laterality: N/A;   STUMP REVISION Left 08/31/2017   Procedure: REVISION LEFT BELOW KNEE AMPUTATION;  Surgeon: Newt Minion, MD;  Location: [facility];  Service: Orthopedics;  Laterality: Left;    ROS- all systems are reviewed and negatives except as per HPI above  Current Outpatient Medications  Medication Sig Dispense Refill   apixaban (ELIQUIS) 5 MG TABS tablet Take 1 tablet (5 mg total) by mouth 2 (two) times daily. 180 tablet 1   atorvastatin (LIPITOR) 20 MG tablet Take 1 tablet (20 mg total) by mouth daily. 90 tablet 3    dofetilide (TIKOSYN) 125 MCG capsule Take 1 capsule (125 mcg total) by mouth 2 (two) times daily. 180 capsule 1   fenofibrate 160 MG tablet Take 1 tablet (160 mg total) by mouth daily. 90 tablet 3   fexofenadine (ALLEGRA) 180 MG tablet Take 1 tablet (180 mg total) by mouth daily. For allergy symptoms 90 tablet 3   fluticasone (FLONASE) 50 MCG/ACT nasal spray Place 1 spray into both nostrils daily as needed for allergies or rhinitis.     magnesium oxide (MAG-OX) 400 MG tablet Take 1 tablet (400 mg total) by mouth daily. 90 tablet 3   sertraline (ZOLOFT) 50 MG tablet Take 1 tablet (50 mg total) by mouth daily. 90 tablet 1   valsartan (DIOVAN) 160 MG tablet Take 1 tablet (160 mg total) by mouth  daily. 90 tablet 3   zolpidem (AMBIEN) 5 MG tablet Take 1 tablet (5 mg total) by mouth at bedtime as needed for sleep. 30 tablet 2   nitroGLYCERIN (NITROSTAT) 0.4 MG SL tablet Place 1 tablet (0.4 mg total) under the tongue every 5 (five) minutes x 3 doses as needed for chest pain. If pain persist after call 911 25 tablet 6   Suvorexant (BELSOMRA) 10 MG TABS Take 10 mg by mouth at bedtime. (Patient not taking: Reported on 10/22/2020) 30 tablet 2   Suvorexant (BELSOMRA) 20 MG TABS Take 20 mg by mouth at bedtime as needed. (Patient not taking: Reported on 10/22/2020) 30 tablet 2   No current facility-administered medications for this visit.    Physical Exam: Vitals:   10/22/20 1545  BP: 110/80  Pulse: (!) 50  SpO2: 96%  Weight: 280 lb 6.4 oz (127.2 kg)  Height: '5\' 6"'$  (1.676 m)    GEN- The patient is well appearing, alert and oriented x 3 today.   Head- normocephalic, atraumatic Eyes-  Sclera clear, conjunctiva pink Ears- hearing intact Oropharynx- clear Lungs- Clear to ausculation bilaterally, normal work of breathing Heart- Regular rate and rhythm, no murmurs, rubs or gallops, PMI not laterally displaced GI- soft, NT, ND, + BS Extremities- s/p L BKA  Wt Readings from Last 3 Encounters:  10/22/20  280 lb 6.4 oz (127.2 kg)  10/01/20 279 lb (126.6 kg)  09/25/20 282 lb 3.2 oz (128 kg)    EKG tracing ordered today is personally reviewed and shows sinus bradycardia 50 bpm, Qtc 424 msec  Assessment and Plan:  Persistent atrial fibrillation Doing well with tikosyn Chads2vasc score is 4.  Continue on eliquis Bmet, mg reviewed from 09/19/20  2. Obesity Body mass index is 45.26 kg/m. Lifestyle modification advised   3. Snoring Sleep study is pending  4. CAD No ischemic symptoms  5. HTN Continue valsartan '160mg'$  daily  6. HL Continue lipitor '20mg'$  daily  Follow-up with Dr Percival Spanish as scheduled in October.  Follow-up in AF clinic every6 months I will see when needed  [name] MD, [facility] 10/22/2020 4:00 PM

## 2020-10-23 ENCOUNTER — Telehealth: Payer: Self-pay

## 2020-10-23 NOTE — Telephone Encounter (Signed)
**Note De-Identified Julia West Obfuscation** The pt left the provider page of her [employer]MSPAF for Eliquis at the office.  I have completed the page and emailed it to Dr Jackalyn Lombard nurse so she can obtain his signature, date it, and to fax to [facility] at the fax number written on the cover letter included or to place in the to be faxed basket in Medical Records and to mail a copy of the page to the pt for her records.

## 2020-10-23 NOTE — Telephone Encounter (Signed)
Printed for Dr. Rayann Heman to sign.

## 2020-10-27 NOTE — Telephone Encounter (Signed)
Signed and place in medical record nurse fax box to be faxed.

## 2020-10-29 NOTE — Telephone Encounter (Signed)
**Note De-Identified [name] Obfuscation** Letter received Julia West fax from [facility] stating that they are missing the pts part of the application for Eliquis. I called and s/w [name] at [facility] and she confirmed that they received the entire provider page of the pts application on [phone] and that nothing else is needed from [place]. She states that they mailed the pt the same letter they faxed to [place] making her aware that they need her part.

## 2020-11-04 ENCOUNTER — Telehealth: Payer: Self-pay | Admitting: Cardiology

## 2020-11-04 NOTE — Telephone Encounter (Signed)
[facility]ps is calling in regards to patient application

## 2020-11-04 NOTE — Telephone Encounter (Signed)
Spoke to  representative  [employer] patient assistance program states they have not received  the patient portion of  the assistance. They are unable to complete the request until this portion is faxed.  Patient acct  # (PAT- [id number])  Patient will need to be contacted  to bring patient part into the office  to be faxed.   Medication was started by Dr Percival Spanish [facility] patient)

## 2020-11-04 NOTE — Telephone Encounter (Signed)
**Note De-Identified Tyrie Porzio Obfuscation** I s/w [place] at [facility] and she advised me that they have reached out to the pt several times concerning her part of her pt asst application for Eliquis and have not heard back from her.  I called the pt and she stated that she was not aware that we had faxed Dr Bonita Quin page in already. She states that she will mail her part of her application with required documents to [facility] tomorrow.  Per the pts request I called [employer] back and s/w [name] to advise them that the pt is mailing her part of her application to them tomorrow. Ralph Leyden stated that she will get a message to the processing team that the pts part is being mailed in so they will not close her case.  She thanked me for following up with them.

## 2020-11-06 ENCOUNTER — Encounter (HOSPITAL_BASED_OUTPATIENT_CLINIC_OR_DEPARTMENT_OTHER): Payer: Medicare Other | Admitting: Cardiovascular Disease

## 2020-11-07 ENCOUNTER — Encounter: Payer: Self-pay | Admitting: Family Medicine

## 2020-11-07 NOTE — Telephone Encounter (Signed)
Please let pharmacy know that ok to fill ambien early this time.

## 2020-11-07 NOTE — Telephone Encounter (Signed)
Covering DR. Stacks please advise

## 2020-11-10 ENCOUNTER — Telehealth: Payer: Self-pay | Admitting: Family Medicine

## 2020-11-10 NOTE — Telephone Encounter (Signed)
Julia West from [facility] Patient Assistance Program Need RX for Suvorexant [facility]) 20 MG TABS faxed to [phone]

## 2020-11-11 NOTE — Telephone Encounter (Signed)
**Note De-Identified Landra Howze Obfuscation** Letter received from [facility] stating that they denied the pt for asst with Eliquis. Reason: Documentation of 3% out of pocket RX expenses, based on household adjusted gross income, not met.  The letter states that they have notified the pt of this denial as well.

## 2020-11-11 NOTE — Telephone Encounter (Signed)
**Note De-Identified Arnesha Schiraldi Obfuscation** See phone note from 8/23.

## 2020-11-12 ENCOUNTER — Other Ambulatory Visit: Payer: Self-pay | Admitting: Family Medicine

## 2020-11-12 MED ORDER — BELSOMRA 20 MG PO TABS: 20.0000 mg | ORAL_TABLET | Freq: Every evening | ORAL | 2 refills | Status: DC | PRN

## 2020-11-12 NOTE — Telephone Encounter (Signed)
I printed, signed and placed on the desk that Jan is using today.

## 2020-11-12 NOTE — Telephone Encounter (Signed)
Prescription faxed to [phone]

## 2020-11-18 NOTE — Progress Notes (Signed)
Received notification from [facility] regarding approval for [place] '20MG'$ . Patient assistance approved from 11/07/20 to 03/14/21.  Medication shipment processed 11/13/20 (no tracking number yet).  Pt will need to call [employer] around mid November to have them mail re-enrollment app for 2023.  Phone: [phone]

## 2020-11-19 ENCOUNTER — Telehealth: Payer: Self-pay | Admitting: Family Medicine

## 2020-11-19 NOTE — Telephone Encounter (Signed)
   Received notification from [facility] regarding approval for [place] '20MG'$ . Patient assistance approved from 11/07/20 to 03/14/21.   Medication shipment processed 11/13/20 (no tracking number yet).   Pt will need to call [employer] around mid November to have them mail re-enrollment app for 2023.   Phone: [phone]     Her belsomera should be on the way soon.  She can call the number above  Next week is just a phone call with me to f/u--she does not need to come in

## 2020-11-19 NOTE — Telephone Encounter (Signed)
Patient aware, states assistance for Eliquis was denied and they are requesting statements for her out of pocket costs. Patient wants to know if she can email those to you for you to send to them.

## 2020-11-20 NOTE — Telephone Encounter (Signed)
She can email or drop by [email or web address]  Thank you!

## 2020-11-24 NOTE — Telephone Encounter (Signed)
Julia West, [facility]    4:45 PM Note   She can email or drop by [email or web address]   Thank you!     November 19, 2020 Christia Reading, LPN to Julia West, [facility]     4:54 PM Note   Patient aware, states assistance for Eliquis was denied and they are requesting statements for her out of pocket costs. Patient wants to know if she can email those to you for you to send to them.      In f/u- pt sending pw for eliquis to her PCP for assistance per the above documentation.

## 2020-11-27 ENCOUNTER — Ambulatory Visit (INDEPENDENT_AMBULATORY_CARE_PROVIDER_SITE_OTHER): Payer: Medicare Other | Admitting: Pharmacist

## 2020-11-27 DIAGNOSIS — I4891 Unspecified atrial fibrillation: Secondary | ICD-10-CM

## 2020-11-27 NOTE — Patient Instructions (Signed)
Visit Information  PATIENT GOALS:  Goals Addressed               This Visit's Progress     Patient Stated     AFIB, INSOMNIA (pt-stated)        Current Barriers:  Unable to independently afford treatment regimen Suboptimal therapeutic regimen for INSOMNIA  Pharmacist Clinical Goal(s):  Over the next 90 days, patient will verbalize ability to afford treatment regimen through collaboration with PharmD and provider.   Interventions: 1:1 collaboration with [name], MD regarding development and update of comprehensive plan of care as evidenced by provider attestation and co-signature Inter-disciplinary care team collaboration (see longitudinal plan of care) Comprehensive medication review performed; medication list updated in electronic medical record  Atrial Fibrillation: controlled; current rate/rhythm control; TIKOSYN anticoagulant treatment: ELIQUIS CHADS2VASc score: 4 DENIES SIGNS/SYMPTOMS OF BLEEDING Home blood pressure, heart rate readings:  150S/70S Assessed patient finances. PATIENT SENT INCOME DOCUMENTS--WILL SUBMIT 3% OUT OF POCKET SPEND TO QUALIFY FOR BMS PATIENT ASSISTANCE PROGRAM (ELIQUIS)--INFO FAXED TO [phone]; SAMPLES GIVEN TO PATIENT  MEDICATION ASSISTANCE: GENERICS SENT TO [facility] PER PATIENT REQUEST (ATORVASTATIN, FENOFIBRATE, VALSARTAN, SERTRALINE INSOMNIA: UNCONTROLLED AND DIFFICULT TO CHOOSE MEDICATION THAT DOESN'T INTERACT WITH TIKOSYN-->BELSOMRA WAS PRESCRIBED, HOWEVER PATIENT CAN'T AFFORD  PCP TO SEND IN AMBIEN SHORT TERM  Received notification from [facility] regarding approval for BELSOMRA '20MG'$ . Patient assistance approved from 11/07/20 to 03/14/21.   Medication shipment processed 11/13/20 (no tracking number yet).   Pt will need to call [employer] around mid November to have them mail re-enrollment app for 2023.   Phone: [phone]  Patient Goals/Self-Care Activities Over the next 90 days, patient will:  - take medications as  prescribed  Follow Up Plan: Telephone follow up appointment with care management team member scheduled for: 2 MONTHS         The patient verbalized understanding of instructions, educational materials, and care plan provided today and declined offer to receive copy of patient instructions, educational materials, and care plan.   Telephone follow up appointment with care management team member scheduled for:01/29/21  Signature Regina Eck, PharmD, BCPS Clinical Pharmacist, [place]  II Phone [phone]

## 2020-11-27 NOTE — Progress Notes (Signed)
Chronic Care Management Pharmacy Note  11/27/2020 Name:  Julia West MRN:  [id number] DOB:  12/08/1955  Summary: AFIB/INSOMNIA  Recommendations/Changes made from today's visit: Atrial Fibrillation: controlled; current rate/rhythm control; TIKOSYN anticoagulant treatment: ELIQUIS CHADS2VASc score: 4 DENIES SIGNS/SYMPTOMS OF BLEEDING Home blood pressure, heart rate readings:  150S/70S Assessed patient finances. PATIENT SENT INCOME DOCUMENTS--WILL SUBMIT 3% OUT OF POCKET SPEND TO QUALIFY FOR BMS PATIENT ASSISTANCE PROGRAM (ELIQUIS)--INFO FAXED TO [phone]; SAMPLES GIVEN TO PATIENT  MEDICATION ASSISTANCE: [facility] PATIENT REQUEST (ATORVASTATIN, FENOFIBRATE, VALSARTAN, SERTRALINE INSOMNIA: UNCONTROLLED AND DIFFICULT TO CHOOSE MEDICATION THAT DOESN'T INTERACT WITH TIKOSYN-->BELSOMRA WAS PRESCRIBED, HOWEVER PATIENT CAN'T AFFORD  PCP TO SEND IN AMBIEN SHORT TERM  Received notification from [facility] regarding approval for [place] $RemoveBefo'20MG'vubviuQpZrN$ . Patient assistance approved from 11/07/20 to 03/14/21.   Medication shipment processed 11/13/20 (no tracking number yet).   Pt will need to call [employer] around mid November to have them mail re-enrollment app for 2023.   Phone: [phone]  Patient Goals/Self-Care Activities Over the next 90 days, patient will:  - take medications as prescribed  Follow Up Plan: Telephone follow up appointment with care management team member scheduled for: 2 MONTHS  Subjective: Julia West is an 65 y.o. year old female who is a primary patient of Stacks, Cletus Gash, MD.  The CCM team was consulted for assistance with disease management and care coordination needs.    Engaged with patient by telephone for follow up visit in response to provider referral for pharmacy case management and/or care coordination services.   Consent to Services:  The patient was given information about Chronic Care Management services, agreed to  services, and gave verbal consent prior to initiation of services.  Please see initial visit note for detailed documentation.   Patient Care Team: [name], MD as PCP - General (Family Medicine) [name], MD as PCP - Cardiology (Cardiology) Lavera Guise, [facility] as Pharmacist (Family Medicine)  Objective:  Lab Results  Component Value Date   CREATININE 0.91 09/19/2020   CREATININE 0.96 09/14/2020   CREATININE 0.74 09/13/2020    Lab Results  Component Value Date   HGBA1C 5.7 (H) 05/27/2020   Last diabetic Eye exam: No results found for: HMDIABEYEEXA  Last diabetic Foot exam: No results found for: HMDIABFOOTEX      Component Value Date/Time   CHOL 144 08/19/2020 0909   TRIG 107 08/19/2020 0909   TRIG 247 (H) 09/12/2013 1137   HDL 41 08/19/2020 0909   HDL 35 (L) 09/12/2013 1137   CHOLHDL 3.5 08/19/2020 0909   LDLCALC 83 08/19/2020 0909   LDLCALC 115 (H) 09/12/2013 1137    Hepatic Function Latest Ref Rng & Units 08/19/2020 05/27/2020 02/19/2020  Total Protein 6.0 - 8.5 g/dL 6.7 7.3 6.9  Albumin 3.8 - 4.8 g/dL 4.3 4.5 4.3  AST 0 - 40 IU/L $Remov'13 20 13  'TBvXzb$ ALT 0 - 32 IU/L $Remov'8 9 11  'vIouIE$ Alk Phosphatase 44 - 121 IU/L 42(L) 42(L) 44  Total Bilirubin 0.0 - 1.2 mg/dL 0.4 0.5 0.4    Lab Results  Component Value Date/Time   TSH 0.499 05/27/2020 11:10 AM   TSH 0.280 (L) 05/07/2017 05:04 AM   TSH 0.414 (L) 12/09/2014 08:49 AM   FREET4 1.16 (H) 05/09/2017 04:57 AM   FREET4 1.25 12/09/2014 08:49 AM    CBC Latest Ref Rng & Units 08/19/2020 07/11/2020 05/27/2020  WBC 3.4 - 10.8 x10E3/uL 11.8(H) 11.8(H) 11.8(H)  Hemoglobin 11.1 - 15.9 g/dL  15.7 15.6(H) 15.9  Hematocrit 34.0 - 46.6 % 47.5(H) 48.8(H) 48.1(H)  Platelets 150 - 450 x10E3/uL 287 268 287    Lab Results  Component Value Date/Time   VD25OH 15.7 (L) 05/27/2020 11:10 AM   VD25OH 25.7 (L) 12/10/2016 12:03 PM    Clinical ASCVD: No  The 10-year ASCVD risk score (Arnett DK, et al., 2019) is: 12.3%   Values used to calculate  the score:     Age: 53 years     Sex: Female     Is Non-Hispanic African American: No     Diabetic: Yes     Tobacco smoker: No     Systolic Blood Pressure: 443 mmHg     Is BP treated: Yes     HDL Cholesterol: 41 mg/dL     Total Cholesterol: 144 mg/dL    Other: (CHADS2VASc if Afib, PHQ9 if depression, MMRC or CAT for COPD, ACT, DEXA)  Social History   Tobacco Use  Smoking Status Former   Packs/day: 1.00   Years: 25.00   Pack years: 25.00   Types: Cigarettes   Quit date: 11/17/2011   Years since quitting: 9.0  Smokeless Tobacco Never   BP Readings from Last 3 Encounters:  10/22/20 110/80  09/25/20 (!) 158/76  09/19/20 (!) 156/56   Pulse Readings from Last 3 Encounters:  10/22/20 (!) 50  09/25/20 (!) 50  09/19/20 (!) 48   Wt Readings from Last 3 Encounters:  10/22/20 280 lb 6.4 oz (127.2 kg)  10/01/20 279 lb (126.6 kg)  09/25/20 282 lb 3.2 oz (128 kg)    Assessment: Review of patient past medical history, allergies, medications, health status, including review of consultants reports, laboratory and other test data, was performed as part of comprehensive evaluation and provision of chronic care management services.   SDOH:  (Social Determinants of Health) assessments and interventions performed:    CCM Care Plan  Allergies  Allergen Reactions   Ace Inhibitors Cough    Medications Reviewed Today     Reviewed by Mendel Ryder, CMA (Certified Medical Assistant) on 10/22/20 at 1545  Med List Status: <None>   Medication Order Taking? Sig Documenting Provider Last Dose Status Informant  apixaban (ELIQUIS) 5 MG TABS tablet [id number] Yes Take 1 tablet (5 mg total) by mouth 2 (two) times daily. [name], MD Taking Active   atorvastatin (LIPITOR) 20 MG tablet [id number] Yes Take 1 tablet (20 mg total) by mouth daily. [name], MD Taking Active   dofetilide [facility]) 125 MCG capsule [id number] Yes Take 1 capsule (125 mcg total) by mouth 2 (two) times daily.  [name], MD Taking Active   fenofibrate 160 MG tablet [id number] Yes Take 1 tablet (160 mg total) by mouth daily. [name], MD Taking Active   fexofenadine [facility]) 180 MG tablet [id number] Yes Take 1 tablet (180 mg total) by mouth daily. For allergy symptoms [name], MD Taking Active   fluticasone [facility]) 50 MCG/ACT nasal spray [id number] Yes Place 1 spray into both nostrils daily as needed for allergies or rhinitis. [provider] Taking Active Self  magnesium oxide (MAG-OX) 400 MG tablet [id number] Yes Take 1 tablet (400 mg total) by mouth daily. [name], MD Taking Active   nitroGLYCERIN (NITROSTAT) 0.4 MG SL tablet [id number]  Place 1 tablet (0.4 mg total) under the tongue every 5 (five) minutes x 3 doses as needed for chest pain. If pain persist after call [place], MD  Expired 09/19/20 2359 Self  sertraline (  ZOLOFT) 50 MG tablet [id number] Yes Take 1 tablet (50 mg total) by mouth daily. [name], MD Taking Active   Suvorexant (BELSOMRA) 10 MG TABS [id number] No Take 10 mg by mouth at bedtime.  Patient not taking: Reported on 10/22/2020   [name], MD Not Taking Active   Suvorexant [facility]) 20 MG TABS [id number] No Take 20 mg by mouth at bedtime as needed.  Patient not taking: Reported on 10/22/2020   [name], MD Not Taking Active   valsartan (DIOVAN) 160 MG tablet [id number] Yes Take 1 tablet (160 mg total) by mouth daily. [name], MD Taking Active   zolpidem (AMBIEN) 5 MG tablet [id number] Yes Take 1 tablet (5 mg total) by mouth at bedtime as needed for sleep. [name], MD Taking Active             Patient Active Problem List   Diagnosis Date Noted   Secondary hypercoagulable state [facility]) 09/01/2020   Coronary artery disease involving native coronary artery of native heart without angina pectoris 07/08/2020   Persistent atrial fibrillation ([place]) 07/08/2020   Prediabetes 07/01/2020   Vitamin D deficiency  07/01/2020   Class 3 severe obesity with serious comorbidity and body mass index (BMI) of 50.0 to 59.9 in adult ([place]) 07/01/2020   Dyslipidemia 11/23/2017   S/P BKA (below knee amputation), left ([place]) 08/31/2017   Dehiscence of amputation stump ([place]) 08/29/2017   Infection of amputation stump, left lower extremity ([place]) 06/19/2017   Bradycardia 05/07/2017   Depression 05/07/2017   Lymphedema 05/07/2017   Post-menopausal bleeding 04/15/2014   Endometrial polyp 04/09/2014   Ventral hernia 03/06/2012   Open abdominal wall wound 03/06/2012   Hyperlipemia 11/12/2008   OBESITY, MORBID 11/12/2008   Essential hypertension 11/12/2008   CAD (coronary artery disease) 11/12/2008    Immunization History  Administered Date(s) Administered   Fluad Quad(high Dose 65+) 12/18/2018, 12/10/2019   Influenza,inj,Quad PF,6+ Mos 01/12/2013, 01/09/2016, 12/10/2016, 01/25/2018   Influenza-Unspecified 12/10/2016, 12/18/2018   PFIZER(Purple Top)SARS-COV-2 Vaccination 10/20/2019, 11/16/2019   Pneumococcal Polysaccharide-23 09/10/2020   Tdap 12/17/2010    Conditions to be addressed/monitored: Atrial Fibrillation  Care Plan : PHARMD MEDICATION MANAGEMENT  Updates made by Lavera Guise, [place] since 11/27/2020 12:00 AM     Problem: DISEASE PROGRESSION PREVENTION      Long-Range Goal: AFIB, INSOMNIA   Recent Progress: Not on track  Priority: High  Note:   Current Barriers:  Unable to independently afford treatment regimen Suboptimal therapeutic regimen for INSOMNIA  Pharmacist Clinical Goal(s):  Over the next 90 days, patient will verbalize ability to afford treatment regimen through collaboration with PharmD and provider.   Interventions: 1:1 collaboration with [name], MD regarding development and update of comprehensive plan of care as evidenced by provider attestation and co-signature Inter-disciplinary care team collaboration (see longitudinal plan of care) Comprehensive medication  review performed; medication list updated in electronic medical record  Atrial Fibrillation: controlled; current rate/rhythm control; TIKOSYN anticoagulant treatment: ELIQUIS CHADS2VASc score: 4 DENIES SIGNS/SYMPTOMS OF BLEEDING Home blood pressure, heart rate readings:  150S/70S Assessed patient finances. PATIENT SENT INCOME DOCUMENTS--WILL SUBMIT 3% OUT OF POCKET SPEND TO QUALIFY FOR BMS PATIENT ASSISTANCE PROGRAM (ELIQUIS)--INFO FAXED TO [phone] ; SAMPLES GIVEN TO PATIENT  MEDICATION ASSISTANCE: [facility]ICS [facility] PER PATIENT REQUEST (ATORVASTATIN, FENOFIBRATE, VALSARTAN, SERTRALINE INSOMNIA: UNCONTROLLED AND DIFFICULT TO CHOOSE MEDICATION THAT DOESN'T INTERACT WITH TIKOSYN-->BELSOMRA WAS PRESCRIBED, HOWEVER PATIENT CAN'T AFFORD  PCP TO SEND IN AMBIEN SHORT TERM  Received notification from [facility] regarding  approval for BELSOMRA $RemoveBefo'20MG'alTugkUdcNu$ . Patient assistance approved from 11/07/20 to 03/14/21.   Medication shipment processed 11/13/20 (no tracking number yet).   Pt will need to call [employer] around mid November to have them mail re-enrollment app for 2023.   Phone: [phone]  Patient Goals/Self-Care Activities Over the next 90 days, patient will:  - take medications as prescribed  Follow Up Plan: Telephone follow up appointment with care management team member scheduled for: 2 MONTHS      Medication Assistance:  BELSOMRA obtained through [employer]  medication assistance program.  Enrollment ends 60/16/58 APPLICATION PENDING FOR ELIQUIS VIA [facility] SQUIBB  Patient's preferred pharmacy is:  [id number] - [place], [place] [address] Phone: [phone] Fax: [phone]  [facility]  ([place], [place] [facility] [address] [place] [address] [place] [address] Phone: [phone] Fax: [place], [place] [address] Phone: [phone] Fax: [phone]  Uses pill box? No - N/A Pt endorses 100% compliance  Follow Up:  Patient agrees to Care Plan and Follow-up.  Plan: Telephone follow up appointment with care management team member scheduled for:  01/29/21   Regina Eck, PharmD, BCPS Clinical Pharmacist, [place]  II Phone [phone]

## 2020-11-29 ENCOUNTER — Other Ambulatory Visit (HOSPITAL_COMMUNITY): Payer: Self-pay | Admitting: Physician Assistant

## 2020-12-02 NOTE — Telephone Encounter (Signed)
**Note De-Identified [name] Obfuscation** Letter received Julia West fax from [facility] stating that they have approved the pt for asst with her Eliquis until 03/14/2021. BMSPAF Case #: [id number]  The letter states that they have notified the pt of this approval as well.

## 2020-12-12 DIAGNOSIS — I4891 Unspecified atrial fibrillation: Secondary | ICD-10-CM

## 2020-12-16 NOTE — Progress Notes (Signed)
pal    Cardiology Office Note   Date:  12/17/2020   ID:  Julia West, DOB 09-Nov-1955, MRN [id number]  PCP:  [name], MD  Cardiologist:   [name], MD  Chief Complaint  Patient presents with   Atrial Fibrillation        History of Present Illness: Julia West is a 65 y.o. female who presented for evaluation of CAD. She was found to be in atrial fib at a previous visit.   She wore a monitor and was noted to be persistent atrial fib with some tachybradycardia rates   I sent her for cardioversion last month.  She gets around with crutches on her prosthesis.  She went back into atrial fib.  She was hospitalized and started on Tikosyn and has been maintaining sinus rhythm.  She says she still fatigued despite being in sinus.  She walks with a crutch with her prosthesis.  She denies any other symptoms.  She denies chest pressure, neck or arm discomfort.  She has no shortness of breath, PND or orthopnea.  She has no weight gain or edema.   Past Medical History:  Diagnosis Date   Acute myocardial infarction    with a ruptured plaque in the circumflex in 2003   Anxiety    Bilateral swelling of feet    Cancer (HCC)    basal cell face   Chest pain    Complication of anesthesia    states low O2 sats post-op 11/13, always slow to awaken   Degenerative joint disease    Dehiscence of amputation stump ([place])    left BKA   Depression    Dyspnea    with activity- car to house - steps   Fatty liver    Gallbladder problem    Heart disease    HTN (hypertension)    Hyperlipidemia LDL goal <70    Incarcerated hernia    Internal and external hemorrhoids without complication    Joint pain    Lymphedema    right side of body   Lymphedema of arm    right   Morbid obesity ([place])    Rheumatoid arthritis ([place])    Surgical wound, non healing ABDOMINAL    has wound vac @ 125 mm Hg   Ventral hernia     Past Surgical History:  Procedure Laterality Date   AMPUTATION  Left 05/06/2017   Procedure: BELOW KNEE AMPUTATION;  Surgeon: Newt Minion, MD;  Location: [place];  Service: Orthopedics;  Laterality: Left;   APPLICATION OF WOUND VAC     BOWEL RESECTION  02/07/2012   Procedure: SMALL BOWEL RESECTION;  Surgeon: Gayland Curry, MD,FACS;  Location: [facility];  Service: General;;   CARDIOVASCULAR STRESS TEST  01-17-2012  DR Neiko Trivedi   LOW RISK NUCLEAR TEST   CARDIOVERSION N/A 07/14/2020   Procedure: CARDIOVERSION;  Surgeon: Arnoldo Lenis, MD;  [facility];  Service: Endoscopy;  Laterality: N/A;   CESAREAN SECTION     x 4 in remote past   CHOLECYSTECTOMY     CORONARY ARTERY BYPASS GRAFT  2003   by Dr. Servando Snare. LIMA to the LAD, free RIMA to the circumflex. Stress perfusion study December 2009 with no high-risk areas of ischemia. She has a well-preserved ejection fraction   HYSTEROSCOPY WITH D & C N/A 05/14/2014   Procedure: DILATATION AND CURETTAGE (no specimen); HYSTEROSCOPY;  Surgeon: Jonnie Kind, MD;  Location[facility];  Service: Gynecology;  Laterality: N/A;  I & D EXTREMITY Left 06/21/2017   Procedure: IRRIGATION AND DEBRIDEMENT OF LEFT LEG AMPUTATION SITE;  Surgeon: Wallace Going, DO;  Location: [facility];  Service: Plastics;  Laterality: Left;   INCISION AND DRAINAGE OF WOUND  04/17/2012   Procedure: IRRIGATION AND DEBRIDEMENT WOUND;  Surgeon: Theodoro Kos, DO;  Location: [facility];  Service: Plastics;  Laterality: N/A;  OF ABDOMINAL WOUND, SURGICAL PREP AND PLACEMENT OF VAC, REMOVAL FOREHEAD SKIN LESION   INCISION AND DRAINAGE OF WOUND N/A 04/24/2012   Procedure: IRRIGATION AND DEBRIDEMENT WOUND;  Surgeon: Theodoro Kos, DO;  Location: [facility];  Service: Plastics;  Laterality: N/A;  I & D ABDOMINAL WOUND WITH VAC AND ACELL   INCISION AND DRAINAGE OF WOUND N/A 05/01/2012   Procedure: IRRIGATION AND DEBRIDEMENT WOUND;  Surgeon: Theodoro Kos, DO;  Location: [facility];  Service: Plastics;  Laterality:  N/A;  WITH SURGICAL PREP AND PLACEMENT OF VAC   INCISION AND DRAINAGE OF WOUND N/A 05/08/2012   Procedure: IRRIGATION AND DEBRIDEMENT OF ABD WOUND SURGICAL PREP AND PLACEMENT OF VAC ;  Surgeon: Theodoro Kos, DO;  Location: [place] [facility];  Service: Plastics;  Laterality: N/A;  IRRIGATION AND DEBRIDEMENT OF ABD WOUND SURGICAL PREP AND PLACEMENT OF VAC    INCISION AND DRAINAGE OF WOUND N/A 05/15/2012   Procedure: IRRIGATION AND DEBRIDEMENT OF ABDOMINAL WOUND WITH POSSIBLE SURGICAL PREP AND PLACEMENT OF VAC;  Surgeon: Theodoro Kos, DO;  Location: [facility];  Service: Plastics;  Laterality: N/A;   INCISION AND DRAINAGE OF WOUND N/A 05/22/2012   Procedure: IRRIGATION AND DEBRIDEMENT OF ABODOMINAL WOUND WITH  SURGICAL PREP AND VAC PLACEMENT;  Surgeon: Theodoro Kos, DO;  Location: [facility];  Service: Plastics;  Laterality: N/A;   INCISION AND DRAINAGE OF WOUND N/A 06/28/2012   Procedure: IRRIGATION AND DEBRIDEMENT OF ABDOMINAL ULCER SURGICAL PREP AND PLACEMENT OF ACELL AND VAC;  Surgeon: Theodoro Kos, DO;  Location: [facility];  Service: Plastics;  Laterality: N/A;   INCISIONAL HERNIA REPAIR  02/07/2012   Procedure: HERNIA REPAIR INCISIONAL;  Surgeon: Gayland Curry, MD,FACS;  Location: [facility];  Service: General;;  Open, Primary repair, strangulated Incisional hernia.   LESION REMOVAL  04/17/2012   Procedure: LESION REMOVAL;  Surgeon: Theodoro Kos, DO;  Location: [facility];  Service: Plastics;  Laterality: N/A;   CENTER OF FOREHEAD, REMOVAL FORHEAD SKIN LESION   ORIF TOE FRACTURE Left 02/09/2017   Procedure: EXCISION TALAR HEAD, INTERNAL FIXATION MEDIAL COLUMN LEFT FOOT;  Surgeon: Newt Minion, MD;  Location: [facility];  Service: Orthopedics;  Laterality: Left;   POLYPECTOMY N/A 05/14/2014   Procedure: ENDOMETRIAL POLYPECTOMY;  Surgeon: Jonnie Kind, MD;  [facility];  Service: Gynecology;  Laterality: N/A;   STUMP REVISION  Left 08/31/2017   Procedure: REVISION LEFT BELOW KNEE AMPUTATION;  Surgeon: Newt Minion, MD;  Location: [facility] [place];  Service: Orthopedics;  Laterality: Left;     Current Outpatient Medications  Medication Sig Dispense Refill   apixaban (ELIQUIS) 5 MG TABS tablet Take 1 tablet (5 mg total) by mouth 2 (two) times daily. 180 tablet 1   atorvastatin (LIPITOR) 20 MG tablet Take 1 tablet (20 mg total) by mouth daily. 90 tablet 3   dofetilide (TIKOSYN) 125 MCG capsule Take 1 capsule (125 mcg total) by mouth 2 (two) times daily. 180 capsule 1   fenofibrate 160 MG tablet Take 1 tablet (160 mg total) by  mouth daily. 90 tablet 3   fexofenadine (ALLEGRA) 180 MG tablet Take 1 tablet (180 mg total) by mouth daily. For allergy symptoms 90 tablet 3   fluticasone (FLONASE) 50 MCG/ACT nasal spray Place 1 spray into both nostrils daily as needed for allergies or rhinitis.     magnesium oxide (MAG-OX) 400 MG tablet Take 1 tablet (400 mg total) by mouth daily. 90 tablet 3   nitroGLYCERIN (NITROSTAT) 0.4 MG SL tablet Place 1 tablet (0.4 mg total) under the tongue every 5 (five) minutes x 3 doses as needed for chest pain. If pain persist after call 911 25 tablet 6   sertraline (ZOLOFT) 50 MG tablet Take 1 tablet (50 mg total) by mouth daily. 90 tablet 1   Suvorexant (BELSOMRA) 20 MG TABS Take 20 mg by mouth at bedtime as needed. 30 tablet 2   valsartan (DIOVAN) 160 MG tablet TAKE 1 TABLET BY MOUTH EVERY DAY 90 tablet 1   zolpidem (AMBIEN) 5 MG tablet Take 1 tablet (5 mg total) by mouth at bedtime as needed for sleep. (Patient not taking: Reported on 12/17/2020) 30 tablet 2   No current facility-administered medications for this visit.    Allergies:   Ace inhibitors    ROS:  Please see the history of present illness.   Otherwise, review of systems are positive for none.   All other systems are reviewed and negative.    PHYSICAL EXAM: VS:  BP (!) 158/80   Pulse (!) 56   Ht 5\' 6"  (1.676 m)   Wt 287 lb (130.2 kg)    BMI 46.32 kg/m  , BMI Body mass index is 46.32 kg/m. GENERAL:  Well appearing NECK:  No jugular venous distention, waveform within normal limits, carotid upstroke brisk and symmetric, no bruits, no thyromegaly LUNGS:  Clear to auscultation bilaterally CHEST:  Unremarkable HEART:  PMI not displaced or sustained,S1 and S2 within normal limits, no S3, no S4, no clicks, no rubs, no murmurs ABD:  Flat, positive bowel sounds normal in frequency in pitch, no bruits, no rebound, no guarding, no midline pulsatile mass, no hepatomegaly, no splenomegaly EXT:  2 plus pulses throughout, no edema, no cyanosis no clubbing   EKG:  EKG is  ordered today. Atrial fibrillation, rate 51 , left axis deviation, poor anterior R wave progression, premature ectopic complexes, nonspecific diffuse T wave flattening.  Premature ventricular contractions.  QTC is normal.  Recent Labs: 05/27/2020: TSH 0.499 08/19/2020: ALT 8; Hemoglobin 15.7; Platelets 287 09/19/2020: BUN 16; Creatinine, Ser 0.91; Magnesium 2.0; Potassium 4.0; Sodium 138    Lipid Panel    Component Value Date/Time   CHOL 144 08/19/2020 0909   TRIG 107 08/19/2020 0909   TRIG 247 (H) 09/12/2013 1137   HDL 41 08/19/2020 0909   HDL 35 (L) 09/12/2013 1137   CHOLHDL 3.5 08/19/2020 0909   LDLCALC 83 08/19/2020 0909   LDLCALC 115 (H) 09/12/2013 1137      Wt Readings from Last 3 Encounters:  12/17/20 287 lb (130.2 kg)  10/22/20 280 lb 6.4 oz (127.2 kg)  10/01/20 279 lb (126.6 kg)      Other studies Reviewed: Additional studies/ records that were reviewed today include: None  Review of the above records demonstrates: See elsewhere   ASSESSMENT AND PLAN:  CAD:   The patient has no new sypmtoms.  No further cardiovascular testing is indicated.  We will continue with aggressive risk reduction and meds as listed.  ATRIAL FIB:   Ms. Julia Janosik  West has a CHA2DS2 - VASc score of 4.  She is maintaining sinus rhythm.  She is tolerating  anticoagulation and Tikosyn.  She had normal electrolytes this summer.  This included magnesium.  No change in therapy.   HTN:  The blood pressure is elevated.  She is going to get a blood pressure cuff and a blood pressure diary will be sent to me via MyChart.   DYSLIPIDEMIA:   LDL was 83 with HDL of 41.  She will continue current statin.  No change in therapy otherwise.  FATIGUE: She was to get a sleep study and I would like her to pursue this.  It was canceled because of illness.  Current medicines are reviewed at length with the patient today.  The patient does not have concerns regarding medicines.  The following changes have been made:  None  Labs/ tests ordered today include:    Orders Placed This Encounter  Procedures   EKG 12-Lead      Disposition:   FU with me in Jan.  Signed, [name], MD  12/17/2020 11:25 AM    [place]

## 2020-12-17 ENCOUNTER — Other Ambulatory Visit: Payer: Self-pay

## 2020-12-17 ENCOUNTER — Ambulatory Visit (INDEPENDENT_AMBULATORY_CARE_PROVIDER_SITE_OTHER): Payer: Medicare Other | Admitting: Cardiology

## 2020-12-17 ENCOUNTER — Encounter: Payer: Self-pay | Admitting: Cardiology

## 2020-12-17 VITALS — BP 158/80 | HR 56 | Ht 66.0 in | Wt 287.0 lb

## 2020-12-17 DIAGNOSIS — I10 Essential (primary) hypertension: Secondary | ICD-10-CM

## 2020-12-17 DIAGNOSIS — I4819 Other persistent atrial fibrillation: Secondary | ICD-10-CM | POA: Diagnosis not present

## 2020-12-17 DIAGNOSIS — I2510 Atherosclerotic heart disease of native coronary artery without angina pectoris: Secondary | ICD-10-CM | POA: Diagnosis not present

## 2020-12-17 DIAGNOSIS — E785 Hyperlipidemia, unspecified: Secondary | ICD-10-CM | POA: Diagnosis not present

## 2020-12-17 NOTE — Patient Instructions (Signed)
Medication Instructions:  The current medical regimen is effective;  continue present plan and medications.  *If you need a refill on your cardiac medications before your next appointment, please call your pharmacy*  Follow-Up: At [facility], you and your health needs are our priority.  As part of our continuing mission to provide you with exceptional heart care, we have created designated Provider Care Teams.  These Care Teams include your primary Cardiologist (physician) and Advanced Practice Providers (APPs -  Physician Assistants and Nurse Practitioners) who all work together to provide you with the care you need, when you need it.  We recommend signing up for the patient portal called "MyChart".  Sign up information is provided on this After Visit Summary.  MyChart is used to connect with patients for Virtual Visits (Telemedicine).  Patients are able to view lab/test results, encounter notes, upcoming appointments, etc.  Non-urgent messages can be sent to your provider as well.   To learn more about what you can do with MyChart, go to [email or web address].    Your next appointment:   3 month(s)  The format for your next appointment:   In Person  Provider:   [name], MD  [employer]ron Blood pressure monitor  Please keep a diary of your blood pressures.

## 2021-01-01 DIAGNOSIS — I10 Essential (primary) hypertension: Secondary | ICD-10-CM

## 2021-01-05 MED ORDER — VALSARTAN 320 MG PO TABS: 320.0000 mg | ORAL_TABLET | Freq: Every day | ORAL | 11 refills | Status: DC

## 2021-01-26 ENCOUNTER — Other Ambulatory Visit: Payer: Medicare Other

## 2021-01-26 ENCOUNTER — Other Ambulatory Visit: Payer: Self-pay

## 2021-01-26 DIAGNOSIS — I10 Essential (primary) hypertension: Secondary | ICD-10-CM

## 2021-01-26 DIAGNOSIS — I4891 Unspecified atrial fibrillation: Secondary | ICD-10-CM | POA: Diagnosis not present

## 2021-01-27 ENCOUNTER — Other Ambulatory Visit: Payer: Self-pay | Admitting: *Deleted

## 2021-01-27 DIAGNOSIS — E875 Hyperkalemia: Secondary | ICD-10-CM

## 2021-01-27 LAB — BMP8+EGFR
BUN/Creatinine Ratio: 20 (ref 12–28)
BUN: 18 mg/dL (ref 8–27)
CO2: 25 mmol/L (ref 20–29)
Calcium: 9.8 mg/dL (ref 8.7–10.3)
Chloride: 106 mmol/L (ref 96–106)
Creatinine, Ser: 0.91 mg/dL (ref 0.57–1.00)
Glucose: 90 mg/dL (ref 70–99)
Potassium: 5.4 mmol/L — ABNORMAL HIGH (ref 3.5–5.2)
Sodium: 142 mmol/L (ref 134–144)
eGFR: 69 mL/min/{1.73_m2} (ref >59)

## 2021-01-29 ENCOUNTER — Telehealth: Payer: Medicare Other

## 2021-02-02 ENCOUNTER — Other Ambulatory Visit: Payer: Self-pay

## 2021-02-02 ENCOUNTER — Other Ambulatory Visit: Payer: Medicare Other

## 2021-02-02 DIAGNOSIS — E875 Hyperkalemia: Secondary | ICD-10-CM | POA: Diagnosis not present

## 2021-02-02 LAB — CMP14+EGFR
ALT: 12 IU/L (ref 0–32)
AST: 17 IU/L (ref 0–40)
Albumin/Globulin Ratio: 1.4 (ref 1.2–2.2)
Albumin: 4.1 g/dL (ref 3.8–4.8)
Alkaline Phosphatase: 57 IU/L (ref 44–121)
BUN/Creatinine Ratio: 17 (ref 12–28)
BUN: 18 mg/dL (ref 8–27)
Bilirubin Total: 0.4 mg/dL (ref 0.0–1.2)
CO2: 26 mmol/L (ref 20–29)
Calcium: 9.5 mg/dL (ref 8.7–10.3)
Chloride: 106 mmol/L (ref 96–106)
Creatinine, Ser: 1.03 mg/dL — ABNORMAL HIGH (ref 0.57–1.00)
Globulin, Total: 2.9 g/dL (ref 1.5–4.5)
Glucose: 90 mg/dL (ref 70–99)
Potassium: 4.6 mmol/L (ref 3.5–5.2)
Sodium: 142 mmol/L (ref 134–144)
Total Protein: 7 g/dL (ref 6.0–8.5)
eGFR: 60 mL/min/{1.73_m2} (ref >59)

## 2021-02-03 ENCOUNTER — Telehealth: Payer: Self-pay | Admitting: Cardiology

## 2021-02-03 ENCOUNTER — Encounter: Payer: Self-pay | Admitting: Cardiology

## 2021-02-03 NOTE — Telephone Encounter (Signed)
Spoke with patient and advised ok to take plain Guaifenesin NO LETTERS Patient verbalized understanding   She did want Dr Percival Spanish to know that she had labs he requested done at her PCP    Will forward to Dr Percival Spanish for review

## 2021-02-03 NOTE — Telephone Encounter (Signed)
Patient would like to know what cold medication she can take with dofetilide (TIKOSYN) 125 MCG capsule. She has a head cold and was wondering what she can take.

## 2021-02-03 NOTE — Telephone Encounter (Signed)
February 03, 2021 [name], MD to Me    9:43 AM Agree

## 2021-02-09 ENCOUNTER — Other Ambulatory Visit: Payer: Self-pay | Admitting: Family Medicine

## 2021-02-16 ENCOUNTER — Telehealth: Payer: Self-pay

## 2021-02-16 MED ORDER — DOFETILIDE 125 MCG PO CAPS: 125.0000 ug | ORAL_CAPSULE | Freq: Two times a day (BID) | ORAL | 1 refills | Status: DC

## 2021-02-16 NOTE — Telephone Encounter (Signed)
Faxed RX request for Dofetilide 125 mg was received from [facility]. RX sent per pharmacy's request.

## 2021-02-17 ENCOUNTER — Ambulatory Visit: Payer: Medicare Other | Admitting: Pharmacist

## 2021-02-17 DIAGNOSIS — E785 Hyperlipidemia, unspecified: Secondary | ICD-10-CM

## 2021-02-17 DIAGNOSIS — I10 Essential (primary) hypertension: Secondary | ICD-10-CM

## 2021-02-17 DIAGNOSIS — I4891 Unspecified atrial fibrillation: Secondary | ICD-10-CM

## 2021-02-18 ENCOUNTER — Encounter: Payer: Self-pay | Admitting: Cardiology

## 2021-02-26 NOTE — Progress Notes (Signed)
Chronic Care Management Pharmacy Note  02/17/2021 Name:  Julia West MRN:  [id number] DOB:  [birth year]  Summary: T2DM, HLD, INSOMNIA  Recommendations/Changes made from today's visit: Atrial Fibrillation: controlled; current rate/rhythm control; TIKOSYN anticoagulant treatment: ELIQUIS CHADS2VASc score: 4 DENIES SIGNS/SYMPTOMS OF BLEEDING Home blood pressure, heart rate readings:  150S/70S Assessed patient finances. PATIENT to gather income documents--need 3% OOP TO QUALIFY FOR BMS PATIENT ASSISTANCE PROGRAM (ELIQUIS)--INFO FAXED TO [phone]   Hyperlipidema -patient tolerating atorvastatin well--f/u repeat labs & expect LDL to be at goal -reviewed purpose and side effects Lipid Panel     Component Value Date/Time   CHOL 144 08/19/2020 0909   TRIG 107 08/19/2020 0909   TRIG 247 (H) 09/12/2013 1137   HDL 41 08/19/2020 0909   HDL 35 (L) 09/12/2013 1137   CHOLHDL 3.5 08/19/2020 0909   LDLCALC 83 08/19/2020 0909   LDLCALC 115 (H) 09/12/2013 1137   LABVLDL 20 08/19/2020 0909    MEDICATION ASSISTANCE: [facility] PATIENT REQUEST (ATORVASTATIN, FENOFIBRATE, VALSARTAN, SERTRALINE INSOMNIA: UNCONTROLLED AND DIFFICULT TO CHOOSE MEDICATION THAT DOESN'T INTERACT WITH TIKOSYN-->BELSOMRA WAS PRESCRIBED, TOLERATING WELL; ONLY ABLE TO CONTINUE ON BELSOMRA DUE TO PATIENT ASSISTANCE PROGRAM  Received notification from [facility] regarding approval for BELSOMRA 20MG. Patient assistance approved from 11/07/20 to 03/14/21.   APPLICATION SENT FOR RE-ENROLLMENT IN [place]) FOR 2023   Phone: [phone]  Patient Goals/Self-Care Activities Over the next 90 days, patient will:  - take medications as prescribed  Follow Up Plan: Telephone follow up appointment with care management team member scheduled for: 2 MONTHS  Subjective: Julia West is an 65 y.o. year old female who is a primary patient of Stacks, Cletus Gash, MD.  The CCM team was  consulted for assistance with disease management and care coordination needs.    Engaged with patient by telephone for follow up visit in response to provider referral for pharmacy case management and/or care coordination services.   Consent to Services:  The patient was given information about Chronic Care Management services, agreed to services, and gave verbal consent prior to initiation of services.  Please see initial visit note for detailed documentation.   Patient Care Team: [name], MD as PCP - General (Family Medicine) [name], MD as PCP - Cardiology (Cardiology) Lavera Guise, [facility] as Pharmacist (Family Medicine)   Objective:  Lab Results  Component Value Date   CREATININE 1.03 (H) 02/02/2021   CREATININE 0.91 01/26/2021   CREATININE 0.91 09/19/2020    Lab Results  Component Value Date   HGBA1C 5.7 (H) 05/27/2020   Last diabetic Eye exam: No results found for: HMDIABEYEEXA  Last diabetic Foot exam: No results found for: HMDIABFOOTEX      Component Value Date/Time   CHOL 144 08/19/2020 0909   TRIG 107 08/19/2020 0909   TRIG 247 (H) 09/12/2013 1137   HDL 41 08/19/2020 0909   HDL 35 (L) 09/12/2013 1137   CHOLHDL 3.5 08/19/2020 0909   LDLCALC 83 08/19/2020 0909   LDLCALC 115 (H) 09/12/2013 1137    Hepatic Function Latest Ref Rng & Units 02/02/2021 08/19/2020 05/27/2020  Total Protein 6.0 - 8.5 g/dL 7.0 6.7 7.3  Albumin 3.8 - 4.8 g/dL 4.1 4.3 4.5  AST 0 - 40 IU/L _0 ALT 0 - 32 IU/L _1 Alk Phosphatase 44 - 121 IU/L 57 42(L) 42(L)  Total Bilirubin 0.0 - 1.2 mg/dL 0.4 0.4 0.5    Lab Results  Component Value Date/Time   TSH 0.499 05/27/2020 11:10 AM   TSH 0.280 (L) 05/07/2017 05:04 AM   TSH 0.414 (L) 12/09/2014 08:49 AM   FREET4 1.16 (H) 05/09/2017 04:57 AM   FREET4 1.25 12/09/2014 08:49 AM    CBC Latest Ref Rng & Units 08/19/2020 07/11/2020 05/27/2020  WBC 3.4 - 10.8 x10E3/uL 11.8(H) 11.8(H) 11.8(H)  Hemoglobin 11.1 - 15.9 g/dL 15.7  15.6(H) 15.9  Hematocrit 34.0 - 46.6 % 47.5(H) 48.8(H) 48.1(H)  Platelets 150 - 450 x10E3/uL 287 268 287    Lab Results  Component Value Date/Time   VD25OH 15.7 (L) 05/27/2020 11:10 AM   VD25OH 25.7 (L) 12/10/2016 12:03 PM    Clinical ASCVD: No  The 10-year ASCVD risk score (Arnett DK, et al., 2019) is: 38.3%   Values used to calculate the score:     Age: 45 years     Sex: Female     Is Non-Hispanic African American: No     Diabetic: Yes     Tobacco smoker: Yes     Systolic Blood Pressure: 096 mmHg     Is BP treated: Yes     HDL Cholesterol: 41 mg/dL     Total Cholesterol: 144 mg/dL    Other: (CHADS2VASc if Afib, PHQ9 if depression, MMRC or CAT for COPD, ACT, DEXA)  Social History   Tobacco Use  Smoking Status Former   Packs/day: 1.00   Years: 25.00   Pack years: 25.00   Types: Cigarettes   Quit date: 11/17/2011   Years since quitting: 9.2  Smokeless Tobacco Never   BP Readings from Last 3 Encounters:  12/17/20 (!) 158/80  10/22/20 110/80  09/25/20 (!) 158/76   Pulse Readings from Last 3 Encounters:  12/17/20 (!) 56  10/22/20 (!) 50  09/25/20 (!) 50   Wt Readings from Last 3 Encounters:  12/17/20 287 lb (130.2 kg)  10/22/20 280 lb 6.4 oz (127.2 kg)  10/01/20 279 lb (126.6 kg)    Assessment: Review of patient past medical history, allergies, medications, health status, including review of consultants reports, laboratory and other test data, was performed as part of comprehensive evaluation and provision of chronic care management services.   SDOH:  (Social Determinants of Health) assessments and interventions performed:    CCM Care Plan  Allergies  Allergen Reactions   Ace Inhibitors Cough    Medications Reviewed Today     Reviewed by Lavera Guise, [facility] (Pharmacist) on 02/26/21 at 0849  Med List Status: <None>   Medication Order Taking? Sig Documenting Provider Last Dose Status Informant  apixaban (ELIQUIS) 5 MG TABS tablet [id number] No Take 1  tablet (5 mg total) by mouth 2 (two) times daily. [name], MD Taking Active   atorvastatin (LIPITOR) 20 MG tablet [id number] No Take 1 tablet (20 mg total) by mouth daily. [name], MD Taking Active   BELSOMRA 20 MG TABS [id number]  TAKE ONE TABLET BY MOUTH AT BEDTIME AS NEEDED [name], MD  Active   dofetilide [facility]) 125 MCG capsule [id number]  Take 1 capsule (125 mcg total) by mouth 2 (two) times daily. [name], MD  Active   fenofibrate 160 MG tablet [id number] No Take 1 tablet (160 mg total) by mouth daily. [name], MD Taking Active   fexofenadine [facility]) 180 MG tablet [id number] No Take 1 tablet (180 mg total) by mouth daily. For allergy symptoms [name], MD Taking Active   fluticasone [facility]) 50 MCG/ACT nasal spray [id number] No Place 1 spray  into both nostrils daily as needed for allergies or rhinitis. [provider] Taking Active Self  magnesium oxide (MAG-OX) 400 MG tablet [id number] No Take 1 tablet (400 mg total) by mouth daily. [name], MD Taking Active   nitroGLYCERIN (NITROSTAT) 0.4 MG SL tablet [id number] No Place 1 tablet (0.4 mg total) under the tongue every 5 (five) minutes x 3 doses as needed for chest pain. If pain persist after call 911 [name], MD Taking Expired 12/17/20 2359 Self  sertraline (ZOLOFT) 50 MG tablet [id number] No Take 1 tablet (50 mg total) by mouth daily. [name], MD Taking Active   valsartan (DIOVAN) 320 MG tablet [id number]  Take 1 tablet (320 mg total) by mouth daily. [name], MD  Active   Patient not taking:  Discontinued 02/26/21 0849 (Change in therapy)            Med Note (Jasmin Trumbull D   Thu Nov 27, 2020 10:34 AM) SHORT TERM WHILE WE GET [place] APPROVED            Patient Active Problem List   Diagnosis Date Noted   Secondary hypercoagulable state ([place]) 09/01/2020   Coronary artery disease involving native coronary artery of native heart without angina pectoris  07/08/2020   Persistent atrial fibrillation ([place]) 07/08/2020   Prediabetes 07/01/2020   Vitamin D deficiency 07/01/2020   Class 3 severe obesity with serious comorbidity and body mass index (BMI) of 50.0 to 59.9 in adult ([place]) 07/01/2020   Dyslipidemia 11/23/2017   S/P BKA (below knee amputation), left ([place]) 08/31/2017   Dehiscence of amputation stump ([place]) 08/29/2017   Infection of amputation stump, left lower extremity ([place]) 06/19/2017   Bradycardia 05/07/2017   Depression 05/07/2017   Lymphedema 05/07/2017   Post-menopausal bleeding 04/15/2014   Endometrial polyp 04/09/2014   Ventral hernia 03/06/2012   Open abdominal wall wound 03/06/2012   Hyperlipemia 11/12/2008   OBESITY, MORBID 11/12/2008   Essential hypertension 11/12/2008   CAD (coronary artery disease) 11/12/2008    Immunization History  Administered Date(s) Administered   Fluad Quad(high Dose 65+) 12/18/2018, 12/10/2019   Influenza,inj,Quad PF,6+ Mos 01/12/2013, 01/09/2016, 12/10/2016, 01/25/2018   Influenza-Unspecified 12/10/2016, 12/18/2018   PFIZER(Purple Top)SARS-COV-2 Vaccination 10/20/2019, 11/16/2019   Pneumococcal Polysaccharide-23 09/10/2020   Tdap 12/17/2010    Conditions to be addressed/monitored: Atrial Fibrillation and HLD  Care Plan : PHARMD MEDICATION MANAGEMENT  Updates made by Lavera Guise, [place] since 02/26/2021 12:00 AM     Problem: DISEASE PROGRESSION PREVENTION      Long-Range Goal: AFIB, INSOMNIA, HLD   Recent Progress: Not on track  Priority: High  Note:   Current Barriers:  Unable to independently afford treatment regimen Suboptimal therapeutic regimen for INSOMNIA  Pharmacist Clinical Goal(s):  Over the next 90 days, patient will verbalize ability to afford treatment regimen through collaboration with PharmD and provider.   Interventions: 1:1 collaboration with [name], MD regarding development and update of comprehensive plan of care as evidenced by provider  attestation and co-signature Inter-disciplinary care team collaboration (see longitudinal plan of care) Comprehensive medication review performed; medication list updated in electronic medical record  Atrial Fibrillation: controlled; current rate/rhythm control; TIKOSYN anticoagulant treatment: ELIQUIS CHADS2VASc score: 4 DENIES SIGNS/SYMPTOMS OF BLEEDING Home blood pressure, heart rate readings:  150S/70S Assessed patient finances. PATIENT to gather income documents--need 3% OOP TO QUALIFY FOR BMS PATIENT ASSISTANCE PROGRAM (ELIQUIS)--INFO FAXED TO [phone]   Hyperlipidema -patient tolerating atorvastatin well--f/u repeat labs & expect LDL to be at goal -reviewed  purpose and side effects Lipid Panel     Component Value Date/Time   CHOL 144 08/19/2020 0909   TRIG 107 08/19/2020 0909   TRIG 247 (H) 09/12/2013 1137   HDL 41 08/19/2020 0909   HDL 35 (L) 09/12/2013 1137   CHOLHDL 3.5 08/19/2020 0909   LDLCALC 83 08/19/2020 0909   LDLCALC 115 (H) 09/12/2013 1137   LABVLDL 20 08/19/2020 0909   MEDICATION ASSISTANCE: [facility] PATIENT REQUEST (ATORVASTATIN, FENOFIBRATE, VALSARTAN, SERTRALINE INSOMNIA: UNCONTROLLED AND DIFFICULT TO CHOOSE MEDICATION THAT DOESN'T INTERACT WITH TIKOSYN-->BELSOMRA WAS PRESCRIBED, TOLERATING WELL; ONLY ABLE TO CONTINUE ON BELSOMRA DUE TO PATIENT ASSISTANCE PROGRAM  Received notification from [facility] regarding approval for BELSOMRA 20MG. Patient assistance approved from 11/07/20 to 03/14/21.   APPLICATION SENT FOR RE-ENROLLMENT IN [place] [facility]) FOR 2023   Phone: [phone]  Patient Goals/Self-Care Activities Over the next 90 days, patient will:  - take medications as prescribed  Follow Up Plan: Telephone follow up appointment with care management team member scheduled for: 2 MONTHS      Medication Assistance: Application for [facility])  medication assistance program. in process.  Anticipated assistance  start date TBD.  See plan of care for additional detail.  Patient's preferred pharmacy is:  [id number] [place] [place] [address]Phone: [phone]: [phone] [facility] ([address] [place]Phone: [phone]: [address] [address]Phone: [phone]: [phone] [facility] - [address] [address]Phone: [phone]: [phone]  Follow Up:  Patient agrees to Care Plan and Follow-up.  Plan: Telephone follow up appointment with care management team member scheduled for:  3 MONTHS   JRegina Eck PharmD, BCPS Clinical Pharmacist, [place] Phone [phone]

## 2021-02-26 NOTE — Patient Instructions (Addendum)
Visit Information  Thank you for taking time to visit with me today. Please don't hesitate to contact me if I can be of assistance to you before our next scheduled telephone appointment.  Following are the goals we discussed today:  Current Barriers:  Unable to independently afford treatment regimen Suboptimal therapeutic regimen for INSOMNIA  Pharmacist Clinical Goal(s):  Over the next 90 days, patient will verbalize ability to afford treatment regimen through collaboration with PharmD and provider.   Interventions: 1:1 collaboration with [name], MD regarding development and update of comprehensive plan of care as evidenced by provider attestation and co-signature Inter-disciplinary care team collaboration (see longitudinal plan of care) Comprehensive medication review performed; medication list updated in electronic medical record  Atrial Fibrillation: controlled; current rate/rhythm control; TIKOSYN anticoagulant treatment: ELIQUIS CHADS2VASc score: 4 DENIES SIGNS/SYMPTOMS OF BLEEDING Home blood pressure, heart rate readings:  150S/70S Assessed patient finances. PATIENT to gather income documents--need 3% OOP TO QUALIFY FOR BMS PATIENT ASSISTANCE PROGRAM (ELIQUIS)--INFO FAXED TO [phone]   Hyperlipidema -patient tolerating atorvastatin well--f/u repeat labs & expect LDL to be at goal -reviewed purpose and side effects Lipid Panel     Component Value Date/Time   CHOL 144 08/19/2020 0909   TRIG 107 08/19/2020 0909   TRIG 247 (H) 09/12/2013 1137   HDL 41 08/19/2020 0909   HDL 35 (L) 09/12/2013 1137   CHOLHDL 3.5 08/19/2020 0909   LDLCALC 83 08/19/2020 0909   LDLCALC 115 (H) 09/12/2013 1137   LABVLDL 20 08/19/2020 0909    MEDICATION ASSISTANCE: [facility] PATIENT REQUEST (ATORVASTATIN, FENOFIBRATE, VALSARTAN, SERTRALINE INSOMNIA: UNCONTROLLED AND DIFFICULT TO CHOOSE MEDICATION THAT DOESN'T INTERACT WITH TIKOSYN-->BELSOMRA WAS  PRESCRIBED, TOLERATING WELL; ONLY ABLE TO CONTINUE ON BELSOMRA DUE TO PATIENT ASSISTANCE PROGRAM  Received notification from [facility] regarding approval for [place] 20MG . Patient assistance approved from 11/07/20 to 03/14/21.   APPLICATION SENT FOR RE-ENROLLMENT IN [place] [facility]) FOR 2023   Phone: [phone]  Patient Goals/Self-Care Activities Over the next 90 days, patient will:  - take medications as prescribed  Follow Up Plan: Telephone follow up appointment with care management team member scheduled for: 2 MONTHS   Please call the care guide team at [phone] if you need to cancel or reschedule your appointment.    The patient verbalized understanding of instructions, educational materials, and care plan provided today and declined offer to receive copy of patient instructions, educational materials, and care plan.    Regina Eck, PharmD, BCPS Clinical Pharmacist, [place]  II Phone [phone]

## 2021-04-01 ENCOUNTER — Ambulatory Visit: Payer: Medicare Other | Admitting: Cardiology

## 2021-04-30 ENCOUNTER — Other Ambulatory Visit: Payer: Self-pay | Admitting: Family Medicine

## 2021-05-10 NOTE — Progress Notes (Deleted)
pal    Cardiology Office Note   Date:  05/10/2021   ID:  Julia West, DOB 1956-02-22, MRN [id number]  PCP:  [name], MD  Cardiologist:   [name], MD  No chief complaint on file.       History of Present Illness: Julia West is a 66 y.o. female who presented for evaluation of CAD. She was found to be in atrial fib at a previous visit.   She wore a monitor and was noted to be persistent atrial fib with some tachybradycardia rates   I sent her for cardioversion last month.  She gets around with crutches on her prosthesis.  She went back into atrial fib.  She was hospitalized and started on Tikosyn and has been maintaining sinus rhythm.  Since I last saw her ***   *** She says she still fatigued despite being in sinus.  She walks with a crutch with her prosthesis.  She denies any other symptoms.  She denies chest pressure, neck or arm discomfort.  She has no shortness of breath, PND or orthopnea.  She has no weight gain or edema.   Past Medical History:  Diagnosis Date   Acute myocardial infarction    with a ruptured plaque in the circumflex in 2003   Anxiety    Bilateral swelling of feet    Cancer (HCC)    basal cell face   Chest pain    Complication of anesthesia    states low O2 sats post-op 11/13, always slow to awaken   Degenerative joint disease    Dehiscence of amputation stump ([place])    left BKA   Depression    Dyspnea    with activity- car to house - steps   Fatty liver    Gallbladder problem    Heart disease    HTN (hypertension)    Hyperlipidemia LDL goal <70    Incarcerated hernia    Internal and external hemorrhoids without complication    Joint pain    Lymphedema    right side of body   Lymphedema of arm    right   Morbid obesity ([place])    Rheumatoid arthritis ([place])    Surgical wound, non healing ABDOMINAL    has wound vac @ 125 mm Hg   Ventral hernia     Past Surgical History:  Procedure Laterality Date   AMPUTATION  Left 05/06/2017   Procedure: BELOW KNEE AMPUTATION;  Surgeon: Newt Minion, MD;  Location: [facility];  Service: Orthopedics;  Laterality: Left;   APPLICATION OF WOUND VAC     BOWEL RESECTION  02/07/2012   Procedure: SMALL BOWEL RESECTION;  Surgeon: Gayland Curry, MD,FACS;  Location: [facility];  Service: General;;   CARDIOVASCULAR STRESS TEST  01-17-2012  DR Ellington [place]lade   LOW RISK NUCLEAR TEST   CARDIOVERSION N/A 07/14/2020   Procedure: CARDIOVERSION;  Surgeon: Arnoldo Lenis, MD;  [facility];  Service: Endoscopy;  Laterality: N/A;   CESAREAN SECTION     x 4 in remote past   CHOLECYSTECTOMY     CORONARY ARTERY BYPASS GRAFT  2003   by Dr. Servando Snare. LIMA to the LAD, free RIMA to the circumflex. Stress perfusion study December 2009 with no high-risk areas of ischemia. She has a well-preserved ejection fraction   HYSTEROSCOPY WITH D & C N/A 05/14/2014   Procedure: DILATATION AND CURETTAGE (no specimen); HYSTEROSCOPY;  Surgeon: Jonnie Kind, MD;  [facility];  Service: Gynecology;  Laterality: N/A;   I & D EXTREMITY Left 06/21/2017   Procedure: IRRIGATION AND DEBRIDEMENT OF LEFT LEG AMPUTATION SITE;  Surgeon: Wallace Going, DO;  [facility];  Service: Plastics;  Laterality: Left;   INCISION AND DRAINAGE OF WOUND  04/17/2012   Procedure: IRRIGATION AND DEBRIDEMENT WOUND;  Surgeon: Theodoro Kos, DO;  Location: [facility];  Service: Plastics;  Laterality: N/A;  OF ABDOMINAL WOUND, SURGICAL PREP AND PLACEMENT OF VAC, REMOVAL FOREHEAD SKIN LESION   INCISION AND DRAINAGE OF WOUND N/A 04/24/2012   Procedure: IRRIGATION AND DEBRIDEMENT WOUND;  Surgeon: Theodoro Kos, DO;  Location: [facility];  Service: Plastics;  Laterality: N/A;  I & D ABDOMINAL WOUND WITH VAC AND ACELL   INCISION AND DRAINAGE OF WOUND N/A 05/01/2012   Procedure: IRRIGATION AND DEBRIDEMENT WOUND;  Surgeon: Theodoro Kos, DO;  Location: [facility];  Service: Plastics;  Laterality:  N/A;  WITH SURGICAL PREP AND PLACEMENT OF VAC   INCISION AND DRAINAGE OF WOUND N/A 05/08/2012   Procedure: IRRIGATION AND DEBRIDEMENT OF ABD WOUND SURGICAL PREP AND PLACEMENT OF VAC ;  Surgeon: Theodoro Kos, DO;  Location: [facility];  Service: Plastics;  Laterality: N/A;  IRRIGATION AND DEBRIDEMENT OF ABD WOUND SURGICAL PREP AND PLACEMENT OF VAC    INCISION AND DRAINAGE OF WOUND N/A 05/15/2012   Procedure: IRRIGATION AND DEBRIDEMENT OF ABDOMINAL WOUND WITH POSSIBLE SURGICAL PREP AND PLACEMENT OF VAC;  Surgeon: Theodoro Kos, DO;  Location: [facility];  Service: Plastics;  Laterality: N/A;   INCISION AND DRAINAGE OF WOUND N/A 05/22/2012   Procedure: IRRIGATION AND DEBRIDEMENT OF ABODOMINAL WOUND WITH  SURGICAL PREP AND VAC PLACEMENT;  Surgeon: Theodoro Kos, DO;  Location: [facility];  Service: Plastics;  Laterality: N/A;   INCISION AND DRAINAGE OF WOUND N/A 06/28/2012   Procedure: IRRIGATION AND DEBRIDEMENT OF ABDOMINAL ULCER SURGICAL PREP AND PLACEMENT OF ACELL AND VAC;  Surgeon: Theodoro Kos, DO;  Location: [facility];  Service: Plastics;  Laterality: N/A;   INCISIONAL HERNIA REPAIR  02/07/2012   Procedure: HERNIA REPAIR INCISIONAL;  Surgeon: Gayland Curry, MD,FACS;  Location: [facility];  Service: General;;  Open, Primary repair, strangulated Incisional hernia.   LESION REMOVAL  04/17/2012   Procedure: LESION REMOVAL;  Surgeon: Theodoro Kos, DO;  Location: [facility];  Service: Plastics;  Laterality: N/A;   CENTER OF FOREHEAD, REMOVAL FORHEAD SKIN LESION   ORIF TOE FRACTURE Left 02/09/2017   Procedure: EXCISION TALAR HEAD, INTERNAL FIXATION MEDIAL COLUMN LEFT FOOT;  Surgeon: Newt Minion, MD;  Location: [facility];  Service: Orthopedics;  Laterality: Left;   POLYPECTOMY N/A 05/14/2014   Procedure: ENDOMETRIAL POLYPECTOMY;  Surgeon: Jonnie Kind, MD;  [facility];  Service: Gynecology;  Laterality: N/A;   STUMP REVISION  Left 08/31/2017   Procedure: REVISION LEFT BELOW KNEE AMPUTATION;  Surgeon: Newt Minion, MD;  Location: [facility];  Service: Orthopedics;  Laterality: Left;     Current Outpatient Medications  Medication Sig Dispense Refill   BELSOMRA 20 MG TABS TAKE ONE TABLET BY MOUTH AT BEDTIME AS NEEDED 30 tablet 1   apixaban (ELIQUIS) 5 MG TABS tablet Take 1 tablet (5 mg total) by mouth 2 (two) times daily. 180 tablet 1   atorvastatin (LIPITOR) 20 MG tablet Take 1 tablet (20 mg total) by mouth daily. 90 tablet 3   dofetilide (TIKOSYN) 125 MCG capsule Take 1 capsule (125 mcg total)  by mouth 2 (two) times daily. 180 capsule 1   fenofibrate 160 MG tablet Take 1 tablet (160 mg total) by mouth daily. 90 tablet 3   fexofenadine (ALLEGRA) 180 MG tablet Take 1 tablet (180 mg total) by mouth daily. For allergy symptoms 90 tablet 3   fluticasone (FLONASE) 50 MCG/ACT nasal spray Place 1 spray into both nostrils daily as needed for allergies or rhinitis.     magnesium oxide (MAG-OX) 400 MG tablet Take 1 tablet (400 mg total) by mouth daily. 90 tablet 3   nitroGLYCERIN (NITROSTAT) 0.4 MG SL tablet Place 1 tablet (0.4 mg total) under the tongue every 5 (five) minutes x 3 doses as needed for chest pain. If pain persist after call 911 25 tablet 6   sertraline (ZOLOFT) 50 MG tablet Take 1 tablet (50 mg total) by mouth daily. 90 tablet 1   valsartan (DIOVAN) 320 MG tablet Take 1 tablet (320 mg total) by mouth daily. 30 tablet 11   No current facility-administered medications for this visit.    Allergies:   Ace inhibitors    ROS:  Please see the history of present illness.   Otherwise, review of systems are positive for ***.   All other systems are reviewed and negative.    PHYSICAL EXAM: VS:  There were no vitals taken for this visit. , BMI There is no height or weight on file to calculate BMI. GENERAL:  Well appearing NECK:  No jugular venous distention, waveform within normal limits, carotid upstroke brisk and  symmetric, no bruits, no thyromegaly LUNGS:  Clear to auscultation bilaterally CHEST:  Unremarkable HEART:  PMI not displaced or sustained,S1 and S2 within normal limits, no S3, no S4, no clicks, no rubs, *** murmurs ABD:  Flat, positive bowel sounds normal in frequency in pitch, no bruits, no rebound, no guarding, no midline pulsatile mass, no hepatomegaly, no splenomegaly EXT:  2 plus pulses throughout, no edema, no cyanosis no clubbing     ***GENERAL:  Well appearing NECK:  No jugular venous distention, waveform within normal limits, carotid upstroke brisk and symmetric, no bruits, no thyromegaly LUNGS:  Clear to auscultation bilaterally CHEST:  Unremarkable HEART:  PMI not displaced or sustained,S1 and S2 within normal limits, no S3, no S4, no clicks, no rubs, no murmurs ABD:  Flat, positive bowel sounds normal in frequency in pitch, no bruits, no rebound, no guarding, no midline pulsatile mass, no hepatomegaly, no splenomegaly EXT:  2 plus pulses throughout, no edema, no cyanosis no clubbing   EKG:  EKG is *** ordered today. Atrial fibrillation, rate *** , left axis deviation, poor anterior R wave progression, premature ectopic complexes, nonspecific diffuse T wave flattening.  Premature ventricular contractions.  QTC is normal.  Recent Labs: 05/27/2020: TSH 0.499 08/19/2020: Hemoglobin 15.7; Platelets 287 09/19/2020: Magnesium 2.0 02/02/2021: ALT 12; BUN 18; Creatinine, Ser 1.03; Potassium 4.6; Sodium 142    Lipid Panel    Component Value Date/Time   CHOL 144 08/19/2020 0909   TRIG 107 08/19/2020 0909   TRIG 247 (H) 09/12/2013 1137   HDL 41 08/19/2020 0909   HDL 35 (L) 09/12/2013 1137   CHOLHDL 3.5 08/19/2020 0909   LDLCALC 83 08/19/2020 0909   LDLCALC 115 (H) 09/12/2013 1137      Wt Readings from Last 3 Encounters:  12/17/20 287 lb (130.2 kg)  10/22/20 280 lb 6.4 oz (127.2 kg)  10/01/20 279 lb (126.6 kg)      Other studies Reviewed: Additional studies/ records  that were reviewed today include: ***  Review of the above records demonstrates: See elsewhere   ASSESSMENT AND PLAN:  CAD:    ***  The patient has no new sypmtoms.  No further cardiovascular testing is indicated.  We will continue with aggressive risk reduction and meds as listed.  ATRIAL FIB:   Julia West has a CHA2DS2 - VASc score of 4.   *** She is maintaining sinus rhythm.  She is tolerating anticoagulation and Tikosyn.  She had normal electrolytes this summer.  This included magnesium.  No change in therapy.   HTN:  The blood pressure is *** elevated.  She is going to get a blood pressure cuff and a blood pressure diary will be sent to me via MyChart.   DYSLIPIDEMIA:   LDL was *** 83 with HDL of 41.  She will continue current statin.  No change in therapy otherwise.  FATIGUE:   *** She was to get a sleep study and I would like her to pursue this.  It was canceled because of illness.  Current medicines are reviewed at length with the patient today.  The patient does not have concerns regarding medicines.  The following changes have been made:  ***  Labs/ tests ordered today include:  ***  No orders of the defined types were placed in this encounter.     Disposition:   FU with me in *** Signed, [name], MD  05/10/2021 7:28 PM    [place]

## 2021-05-13 ENCOUNTER — Ambulatory Visit (INDEPENDENT_AMBULATORY_CARE_PROVIDER_SITE_OTHER): Payer: Medicare Other | Admitting: Cardiology

## 2021-05-13 ENCOUNTER — Other Ambulatory Visit: Payer: Self-pay | Admitting: *Deleted

## 2021-05-13 ENCOUNTER — Encounter: Payer: Self-pay | Admitting: Cardiology

## 2021-05-13 ENCOUNTER — Other Ambulatory Visit: Payer: Self-pay

## 2021-05-13 VITALS — BP 152/80 | HR 58 | Ht 66.0 in | Wt 305.0 lb

## 2021-05-13 DIAGNOSIS — I10 Essential (primary) hypertension: Secondary | ICD-10-CM

## 2021-05-13 DIAGNOSIS — I2510 Atherosclerotic heart disease of native coronary artery without angina pectoris: Secondary | ICD-10-CM

## 2021-05-13 DIAGNOSIS — E785 Hyperlipidemia, unspecified: Secondary | ICD-10-CM | POA: Diagnosis not present

## 2021-05-13 DIAGNOSIS — R5383 Other fatigue: Secondary | ICD-10-CM

## 2021-05-13 DIAGNOSIS — Z79899 Other long term (current) drug therapy: Secondary | ICD-10-CM | POA: Diagnosis not present

## 2021-05-13 DIAGNOSIS — I4819 Other persistent atrial fibrillation: Secondary | ICD-10-CM | POA: Diagnosis not present

## 2021-05-13 MED ORDER — FUROSEMIDE 20 MG PO TABS: 20.0000 mg | ORAL_TABLET | Freq: Every day | ORAL | 6 refills | Status: DC | PRN

## 2021-05-13 MED ORDER — AMLODIPINE BESYLATE 2.5 MG PO TABS: 2.5000 mg | ORAL_TABLET | Freq: Every day | ORAL | 3 refills | Status: DC

## 2021-05-13 NOTE — Patient Instructions (Addendum)
Medication Instructions:  ?Please take Furosemide 20 mg a day as needed only. ?Start Amlodipine 2.5 mg a day. ?Continue all other medications as listed. ? ?*If you need a refill on your cardiac medications before your next appointment, please call your pharmacy* ? ?Lab Work: ?Please have blood work in June at [facility] ([place], [place]) ? ?If you have labs (blood work) drawn today and your tests are completely normal, you will receive your results only by: ?MyChart Message (if you have MyChart) OR ?A paper copy in the mail ?If you have any lab test that is abnormal or we need to change your treatment, we will call you to review the results. ? ?Testing/Procedures: ?Your physician has recommended that you have a sleep study. This test records several body functions during sleep, including: brain activity, eye movement, oxygen and carbon dioxide blood levels, heart rate and rhythm, breathing rate and rhythm, the flow of air through your mouth and nose, snoring, body muscle movements, and chest and belly movement.  You will be contacted to be scheduled for this test. ? ?Follow-Up: ?At [facility], you and your health needs are our priority.  As part of our continuing mission to provide you with exceptional heart care, we have created designated Provider Care Teams.  These Care Teams include your primary Cardiologist (physician) and Advanced Practice Providers (APPs -  Physician Assistants and Nurse Practitioners) who all work together to provide you with the care you need, when you need it. ? ?We recommend signing up for the patient portal called "MyChart".  Sign up information is provided on this After Visit Summary.  MyChart is used to connect with patients for Virtual Visits (Telemedicine).  Patients are able to view lab/test results, encounter notes, upcoming appointments, etc.  Non-urgent messages can be sent to your provider as well.   ?To learn more about what you can do with MyChart, go to [email or web address].    ? ?Your next appointment:   ?6 month(s) ? ?The format for your next appointment:   ?In Person ? ?Provider:   ?[name], MD{ ? ?Thank you for choosing [facility]!! ? ? ? ?

## 2021-05-13 NOTE — Progress Notes (Signed)
pal    Cardiology Office Note   Date:  05/13/2021   ID:  ADELAI ACHEY, DOB [birth year], MRN [id number]  PCP:  [name], MD  Cardiologist:   [name], MD  Chief Complaint  Patient presents with   Leg Swelling        History of Present Illness: Julia West is a 66 y.o. female who presented for evaluation of CAD. She was found to be in atrial fib at a previous visit.   She wore a monitor and was noted to be persistent atrial fib with some tachybradycardia rates   I sent her for cardioversion last month.  She gets around with crutches on her prosthesis.  She went back into atrial fib.  She was hospitalized and started on Tikosyn and has been maintaining sinus rhythm.  Since I last saw her she has done well.  She has a little increased extremity swelling.  However, she has had no new palpitations, presyncope or syncope.  Has had no chest pressure, neck or arm discomfort.  She has no weight gain or edema.  She gets around on a prosthesis and with crutches.  Her blood pressures have been elevated as reported below.    Past Medical History:  Diagnosis Date   Acute myocardial infarction    with a ruptured plaque in the circumflex in 2003   Anxiety    Cancer ([place])    basal cell face   Complication of anesthesia    states low O2 sats post-op 11/13, always slow to awaken   Degenerative joint disease    Dehiscence of amputation stump (HCC)    left BKA   Depression    Fatty liver    HTN (hypertension)    Hyperlipidemia LDL goal <70    Incarcerated hernia    Internal and external hemorrhoids without complication    Lymphedema of arm    right   Morbid obesity (HCC)    Rheumatoid arthritis ([place])    Surgical wound, non healing ABDOMINAL    has wound vac @ 125 mm Hg   Ventral hernia     Past Surgical History:  Procedure Laterality Date   AMPUTATION Left 05/06/2017   Procedure: BELOW KNEE AMPUTATION;  Surgeon: Newt Minion, MD;  Location: [facility];  Service:  Orthopedics;  Laterality: Left;   APPLICATION OF WOUND VAC     BOWEL RESECTION  02/07/2012   Procedure: SMALL BOWEL RESECTION;  Surgeon: Gayland Curry, MD,FACS;  Location: [facility];  Service: General;;   CARDIOVASCULAR STRESS TEST  01-17-2012  DR Shaneika Rossa   LOW RISK NUCLEAR TEST   CARDIOVERSION N/A 07/14/2020   Procedure: CARDIOVERSION;  Surgeon: Arnoldo Lenis, MD;  [facility];  Service: Endoscopy;  Laterality: N/A;   CESAREAN SECTION     x 4 in remote past   CHOLECYSTECTOMY     CORONARY ARTERY BYPASS GRAFT  2003   by Dr. Servando Snare. LIMA to the LAD, free RIMA to the circumflex. Stress perfusion study December 2009 with no high-risk areas of ischemia. She has a well-preserved ejection fraction   HYSTEROSCOPY WITH D & C N/A 05/14/2014   Procedure: DILATATION AND CURETTAGE (no specimen); HYSTEROSCOPY;  Surgeon: Jonnie Kind, MD;  [facility];  Service: Gynecology;  Laterality: N/A;   I & D EXTREMITY Left 06/21/2017   Procedure: IRRIGATION AND DEBRIDEMENT OF LEFT LEG AMPUTATION SITE;  Surgeon: Wallace Going, DO;  [facility];  Service: Plastics;  Laterality: Left;  INCISION AND DRAINAGE OF WOUND  04/17/2012   Procedure: IRRIGATION AND DEBRIDEMENT WOUND;  Surgeon: Theodoro Kos, DO;  Location: [facility];  Service: Plastics;  Laterality: N/A;  OF ABDOMINAL WOUND, SURGICAL PREP AND PLACEMENT OF VAC, REMOVAL FOREHEAD SKIN LESION   INCISION AND DRAINAGE OF WOUND N/A 04/24/2012   Procedure: IRRIGATION AND DEBRIDEMENT WOUND;  Surgeon: Theodoro Kos, DO;  Location: [facility];  Service: Plastics;  Laterality: N/A;  I & D ABDOMINAL WOUND WITH VAC AND ACELL   INCISION AND DRAINAGE OF WOUND N/A 05/01/2012   Procedure: IRRIGATION AND DEBRIDEMENT WOUND;  Surgeon: Theodoro Kos, DO;  Location: [facility];  Service: Plastics;  Laterality: N/A;  WITH SURGICAL PREP AND PLACEMENT OF VAC   INCISION AND DRAINAGE OF WOUND N/A 05/08/2012   Procedure:  IRRIGATION AND DEBRIDEMENT OF ABD WOUND SURGICAL PREP AND PLACEMENT OF VAC ;  Surgeon: Theodoro Kos, DO;  Location: [facility];  Service: Plastics;  Laterality: N/A;  IRRIGATION AND DEBRIDEMENT OF ABD WOUND SURGICAL PREP AND PLACEMENT OF VAC    INCISION AND DRAINAGE OF WOUND N/A 05/15/2012   Procedure: IRRIGATION AND DEBRIDEMENT OF ABDOMINAL WOUND WITH POSSIBLE SURGICAL PREP AND PLACEMENT OF VAC;  Surgeon: Theodoro Kos, DO;  Location: [facility];  Service: Plastics;  Laterality: N/A;   INCISION AND DRAINAGE OF WOUND N/A 05/22/2012   Procedure: IRRIGATION AND DEBRIDEMENT OF ABODOMINAL WOUND WITH  SURGICAL PREP AND VAC PLACEMENT;  Surgeon: Theodoro Kos, DO;  Location: [facility];  Service: Plastics;  Laterality: N/A;   INCISION AND DRAINAGE OF WOUND N/A 06/28/2012   Procedure: IRRIGATION AND DEBRIDEMENT OF ABDOMINAL ULCER SURGICAL PREP AND PLACEMENT OF ACELL AND VAC;  Surgeon: Theodoro Kos, DO;  Location: [facility];  Service: Plastics;  Laterality: N/A;   INCISIONAL HERNIA REPAIR  02/07/2012   Procedure: HERNIA REPAIR INCISIONAL;  Surgeon: Gayland Curry, MD,FACS;  Location: [place];  Service: General;;  Open, Primary repair, strangulated Incisional hernia.   LESION REMOVAL  04/17/2012   Procedure: LESION REMOVAL;  Surgeon: Theodoro Kos, DO;  Location: [place];  Service: Plastics;  Laterality: N/A;   CENTER OF FOREHEAD, REMOVAL FORHEAD SKIN LESION   ORIF TOE FRACTURE Left 02/09/2017   Procedure: EXCISION TALAR HEAD, INTERNAL FIXATION MEDIAL COLUMN LEFT FOOT;  Surgeon: Newt Minion, MD;  Location: [facility];  Service: Orthopedics;  Laterality: Left;   POLYPECTOMY N/A 05/14/2014   Procedure: ENDOMETRIAL POLYPECTOMY;  Surgeon: Jonnie Kind, MD;  [facility];  Service: Gynecology;  Laterality: N/A;   STUMP REVISION Left 08/31/2017   Procedure: REVISION LEFT BELOW KNEE AMPUTATION;  Surgeon: Newt Minion, MD;  Location: [facility];  Service: Orthopedics;  Laterality: Left;     Current Outpatient Medications  Medication Sig Dispense Refill   amLODipine (NORVASC) 2.5 MG tablet Take 1 tablet (2.5 mg total) by mouth daily. 90 tablet 3   apixaban (ELIQUIS) 5 MG TABS tablet Take 1 tablet (5 mg total) by mouth 2 (two) times daily. 180 tablet 1   atorvastatin (LIPITOR) 20 MG tablet Take 1 tablet (20 mg total) by mouth daily. 90 tablet 3   BELSOMRA 20 MG TABS TAKE ONE TABLET BY MOUTH AT BEDTIME AS NEEDED 30 tablet 1   dofetilide (TIKOSYN) 125 MCG capsule Take 1 capsule (125 mcg total) by mouth 2 (two) times daily. 180 capsule 1   fenofibrate 160 MG tablet Take 1 tablet (160 mg  total) by mouth daily. 90 tablet 3   fexofenadine (ALLEGRA) 180 MG tablet Take 1 tablet (180 mg total) by mouth daily. For allergy symptoms 90 tablet 3   fluticasone (FLONASE) 50 MCG/ACT nasal spray Place 1 spray into both nostrils daily as needed for allergies or rhinitis.     furosemide (LASIX) 20 MG tablet Take 1 tablet (20 mg total) by mouth daily as needed. 30 tablet 6   sertraline (ZOLOFT) 50 MG tablet Take 1 tablet (50 mg total) by mouth daily. 90 tablet 1   valsartan (DIOVAN) 320 MG tablet Take 1 tablet (320 mg total) by mouth daily. 30 tablet 11   nitroGLYCERIN (NITROSTAT) 0.4 MG SL tablet Place 1 tablet (0.4 mg total) under the tongue every 5 (five) minutes x 3 doses as needed for chest pain. If pain persist after call 911 25 tablet 6   No current facility-administered medications for this visit.    Allergies:   Ace inhibitors    ROS:  Please see the history of present illness.   Otherwise, review of systems are positive for none.   All other systems are reviewed and negative.    PHYSICAL EXAM: VS:  BP (!) 152/80    Pulse (!) 58    Ht 5\' 6"  (1.676 m)    Wt (!) 305 lb (138.3 kg)    BMI 49.23 kg/m  , BMI Body mass index is 49.23 kg/m. GEN:  No distress NECK:  No jugular venous distention at 90 degrees, waveform within normal limits,  carotid upstroke brisk and symmetric, no bruits, no thyromegaly LYMPHATICS:  No cervical adenopathy LUNGS:  Clear to auscultation bilaterally BACK:  No CVA tenderness CHEST:  Unremarkable HEART:  S1 and S2 within normal limits, no S3, no S4, no clicks, no rubs, no murmurs ABD:  Positive bowel sounds normal in frequency in pitch, no bruits, no rebound, no guarding, unable to assess midline mass or bruit with the patient seated.  S/p amputation of the leg  EKG:  EKG is  ordered today. Atrial fibrillation, rate 58, left axis deviation, poor anterior R wave progression, premature ectopic complexes, nonspecific diffuse T wave flattening.  Premature ventricular contractions.  QTC is normal.  Recent Labs: 05/27/2020: TSH 0.499 08/19/2020: Hemoglobin 15.7; Platelets 287 09/19/2020: Magnesium 2.0 02/02/2021: ALT 12; BUN 18; Creatinine, Ser 1.03; Potassium 4.6; Sodium 142    Lipid Panel    Component Value Date/Time   CHOL 144 08/19/2020 0909   TRIG 107 08/19/2020 0909   TRIG 247 (H) 09/12/2013 1137   HDL 41 08/19/2020 0909   HDL 35 (L) 09/12/2013 1137   CHOLHDL 3.5 08/19/2020 0909   LDLCALC 83 08/19/2020 0909   LDLCALC 115 (H) 09/12/2013 1137      Wt Readings from Last 3 Encounters:  05/13/21 (!) 305 lb (138.3 kg)  12/17/20 287 lb (130.2 kg)  10/22/20 280 lb 6.4 oz (127.2 kg)      Other studies Reviewed: Additional studies/ records that were reviewed today include: Labs Review of the above records demonstrates: See elsewhere   ASSESSMENT AND PLAN:  CAD:    The patient has no new sypmtoms.  No further cardiovascular testing is indicated.  We will continue with aggressive risk reduction and meds as listed.  ATRIAL FIB:   Ms. LATEYA DAURIA has a CHA2DS2 - VASc score of 4.   She seems to be maintaining sinus rhythm and tolerating Tikosyn.  In June she will get basic metabolic profile and magnesium.  HTN:  The blood pressure is not controlled.  I will add amlodipine 2.5 mg daily.    DYSLIPIDEMIA:   LDL was LDL was 83 with an HDL of 41.  She will continue current statin.   FATIGUE:   I will be ordering a sleep study.  She has daytime somnolence and high Epworth score.  We were unable to do this previously because COVID interrupted her plans.    EDEMA: She has some mild edema.  She really wants to have this managed and she watches her salt.  I will give her Lasix to be taken sparingly.  She has had high potassium before so she does not need to take potassium supplement with 20 mg as needed Lasix but she will increase her potassium containing foods  Current medicines are reviewed at length with the patient today.  The patient does not have concerns regarding medicines.  The following changes have been made: As above  Labs/ tests ordered today include:    Orders Placed This Encounter  Procedures   Basic metabolic panel   Magnesium   EKG 12-Lead   Split night study    Disposition:   FU with me in 6 months   Signed, [name], MD  05/13/2021 11:44 AM    [place]

## 2021-05-29 ENCOUNTER — Telehealth: Payer: Self-pay | Admitting: Family Medicine

## 2021-05-29 NOTE — Telephone Encounter (Signed)
Patient aware and verbalized understanding. States she wants this message sent to Dr. Livia Snellen for First thing Monday morning. States this always gets called in for her. Please advise. ?

## 2021-05-29 NOTE — Telephone Encounter (Signed)
This is controlled and needs a follow up appt with PCP>  ?

## 2021-05-30 ENCOUNTER — Other Ambulatory Visit: Payer: Self-pay | Admitting: Family Medicine

## 2021-05-30 MED ORDER — BELSOMRA 20 MG PO TABS: 1.0000 | ORAL_TABLET | Freq: Every evening | ORAL | 1 refills | Status: DC | PRN

## 2021-05-30 NOTE — Telephone Encounter (Signed)
Please let the patient know that I sent their prescription to their pharmacy. Thanks, WS 

## 2021-06-01 ENCOUNTER — Other Ambulatory Visit: Payer: Self-pay | Admitting: Family Medicine

## 2021-06-01 MED ORDER — BELSOMRA 20 MG PO TABS: 1.0000 | ORAL_TABLET | Freq: Every evening | ORAL | 1 refills | Status: DC | PRN

## 2021-06-01 NOTE — Telephone Encounter (Signed)
RX needs to be sent [facility] not [facility]. Phone number [phone] ?

## 2021-06-01 NOTE — Telephone Encounter (Signed)
Left detailed message.   

## 2021-06-01 NOTE — Telephone Encounter (Signed)
Please let the patient know that I sent their prescription to their pharmacy. Thanks, WS 

## 2021-06-07 ENCOUNTER — Encounter: Payer: Self-pay | Admitting: Cardiology

## 2021-06-08 ENCOUNTER — Other Ambulatory Visit: Payer: Self-pay

## 2021-06-08 ENCOUNTER — Other Ambulatory Visit: Payer: Self-pay | Admitting: *Deleted

## 2021-06-08 ENCOUNTER — Other Ambulatory Visit: Payer: Self-pay | Admitting: Cardiology

## 2021-06-08 ENCOUNTER — Encounter: Payer: Self-pay | Admitting: Cardiology

## 2021-06-08 ENCOUNTER — Telehealth: Payer: Self-pay | Admitting: Family Medicine

## 2021-06-08 DIAGNOSIS — E785 Hyperlipidemia, unspecified: Secondary | ICD-10-CM

## 2021-06-08 DIAGNOSIS — I10 Essential (primary) hypertension: Secondary | ICD-10-CM

## 2021-06-08 MED ORDER — FENOFIBRATE 160 MG PO TABS: 160.0000 mg | ORAL_TABLET | Freq: Every day | ORAL | 0 refills | Status: DC

## 2021-06-08 MED ORDER — ATORVASTATIN CALCIUM 20 MG PO TABS: 20.0000 mg | ORAL_TABLET | Freq: Every day | ORAL | 0 refills | Status: DC

## 2021-06-08 MED ORDER — VALSARTAN 320 MG PO TABS: 320.0000 mg | ORAL_TABLET | Freq: Every day | ORAL | 0 refills | Status: DC

## 2021-06-08 MED ORDER — AMLODIPINE BESYLATE 2.5 MG PO TABS: 2.5000 mg | ORAL_TABLET | Freq: Every day | ORAL | 3 refills | Status: DC

## 2021-06-08 MED ORDER — APIXABAN 5 MG PO TABS: 5.0000 mg | ORAL_TABLET | Freq: Two times a day (BID) | ORAL | 1 refills | Status: DC

## 2021-06-08 MED ORDER — SERTRALINE HCL 50 MG PO TABS: 50.0000 mg | ORAL_TABLET | Freq: Every day | ORAL | 0 refills | Status: DC

## 2021-06-08 NOTE — Telephone Encounter (Signed)
Pt needs for her rx refills to go to [facility] mail order ?sertraline (ZOLOFT) 50 MG tablet ?valsartan (DIOVAN) 320 MG tablet ?atorvastatin (LIPITOR) 20 MG tablet ?fenofibrate 160 MG tablet ?

## 2021-06-08 NOTE — Telephone Encounter (Signed)
Prescription refill request for Eliquis received. ?Indication:Afib ?Last office visit:3/23 ?Scr:0.9 ?Age: 66 ?Weight:138.2 kg ? ?Prescription refilled ? ?

## 2021-06-08 NOTE — Telephone Encounter (Signed)
DONE

## 2021-06-08 NOTE — Telephone Encounter (Signed)
?*  STAT* If patient is at the pharmacy, call can be transferred to refill team. ? ? ?1. Which medications need to be refilled? (please list name of each medication and dose if known) amLODipine (NORVASC) 2.5 MG tablet ? ?apixaban (ELIQUIS) 5 MG TABS tablet ? ?valsartan (DIOVAN) 320 MG tablet ? ?2. Which pharmacy/location (including street and city if local pharmacy) is medication to be sent to? [facility] ([place], [place] ? ?3. Do they need a 30 day or 90 day supply? 90 ? ? ? ?*STAT* If patient is at the pharmacy, call can be transferred to refill team. ? ? ?1. Which medications need to be refilled? (please list name of each medication and dose if known) dofetilide (TIKOSYN) 125 MCG capsule ? ?2. Which pharmacy/location (including street and city if local pharmacy) is medication to be sent to? (Inactive) [place], [place] ? ?3. Do they need a 30 day or 90 day supply? 90 ? ?

## 2021-07-03 ENCOUNTER — Encounter: Payer: Medicare Other | Admitting: Cardiovascular Disease

## 2021-08-09 ENCOUNTER — Other Ambulatory Visit: Payer: Self-pay | Admitting: Family Medicine

## 2021-08-09 DIAGNOSIS — E785 Hyperlipidemia, unspecified: Secondary | ICD-10-CM

## 2021-08-19 ENCOUNTER — Other Ambulatory Visit: Payer: Self-pay | Admitting: Cardiology

## 2021-08-19 NOTE — Telephone Encounter (Signed)
Prescription refill request for Eliquis received. Indication:Afib Last office visit:3/23 Scr:1.0 Age: 66 Weight:138.3 kg  Prescription refilled

## 2021-08-20 ENCOUNTER — Telehealth: Payer: Self-pay | Admitting: Family Medicine

## 2021-08-20 NOTE — Telephone Encounter (Signed)
Pt aware.

## 2021-08-20 NOTE — Telephone Encounter (Signed)
Ibeuprofen, ice, heat, warm baths, stretching

## 2021-08-22 ENCOUNTER — Other Ambulatory Visit: Payer: Self-pay | Admitting: Cardiology

## 2021-08-22 DIAGNOSIS — I4819 Other persistent atrial fibrillation: Secondary | ICD-10-CM

## 2021-08-24 NOTE — Telephone Encounter (Signed)
Prescription refill request for Eliquis received. Indication: Afib  Last office visit:05/13/21 ([place])  Scr: 0.91 (01/26/21)  Age: 66 Weight: 138.3kg  Appropriate dose and refill sent to requested pharmacy.

## 2021-09-04 ENCOUNTER — Other Ambulatory Visit: Payer: Self-pay | Admitting: Family Medicine

## 2021-09-04 ENCOUNTER — Other Ambulatory Visit: Payer: Self-pay | Admitting: Cardiology

## 2021-09-04 DIAGNOSIS — E785 Hyperlipidemia, unspecified: Secondary | ICD-10-CM

## 2021-09-04 DIAGNOSIS — I10 Essential (primary) hypertension: Secondary | ICD-10-CM

## 2021-09-09 ENCOUNTER — Telehealth: Payer: Self-pay | Admitting: Cardiology

## 2021-09-09 NOTE — Telephone Encounter (Signed)
*  STAT* If patient is at the pharmacy, call can be transferred to refill team.   1. Which medications need to be refilled? (please list name of each medication and dose if known) dofetilide (TIKOSYN) 125 MCG capsule  2. Which pharmacy/location (including street and city if local pharmacy) is medication to be sent to? [email or web address]  3. Do they need a 30 day or 90 day supply? 90 day

## 2021-09-10 MED ORDER — DOFETILIDE 125 MCG PO CAPS
125.0000 ug | ORAL_CAPSULE | Freq: Two times a day (BID) | ORAL | 1 refills | Status: DC
Start: 2021-09-10 — End: 2021-11-06

## 2021-09-17 ENCOUNTER — Ambulatory Visit: Payer: Medicare Other | Admitting: Family Medicine

## 2021-09-20 ENCOUNTER — Inpatient Hospital Stay (HOSPITAL_COMMUNITY)
Admission: EM | Admit: 2021-09-20 | Discharge: 2021-09-25 | DRG: 308 | Disposition: A | Discharge status: Home / Self Care | Payer: Medicare Other | Attending: Internal Medicine | Admitting: Internal Medicine

## 2021-09-20 ENCOUNTER — Telehealth: Payer: Self-pay | Admitting: Home Health

## 2021-09-20 ENCOUNTER — Encounter (HOSPITAL_COMMUNITY): Payer: Self-pay

## 2021-09-20 ENCOUNTER — Other Ambulatory Visit: Payer: Self-pay

## 2021-09-20 ENCOUNTER — Emergency Department (HOSPITAL_COMMUNITY): Payer: Medicare Other

## 2021-09-20 DIAGNOSIS — I10 Essential (primary) hypertension: Secondary | ICD-10-CM | POA: Diagnosis not present

## 2021-09-20 DIAGNOSIS — I509 Heart failure, unspecified: Secondary | ICD-10-CM | POA: Diagnosis not present

## 2021-09-20 DIAGNOSIS — B9620 Unspecified Escherichia coli [E. coli] as the cause of diseases classified elsewhere: Secondary | ICD-10-CM | POA: Diagnosis present

## 2021-09-20 DIAGNOSIS — Z20822 Contact with and (suspected) exposure to covid-19: Secondary | ICD-10-CM | POA: Diagnosis present

## 2021-09-20 DIAGNOSIS — I2510 Atherosclerotic heart disease of native coronary artery without angina pectoris: Secondary | ICD-10-CM | POA: Diagnosis present

## 2021-09-20 DIAGNOSIS — Z888 Allergy status to other drugs, medicaments and biological substances status: Secondary | ICD-10-CM

## 2021-09-20 DIAGNOSIS — I4819 Other persistent atrial fibrillation: Secondary | ICD-10-CM | POA: Diagnosis not present

## 2021-09-20 DIAGNOSIS — I5023 Acute on chronic systolic (congestive) heart failure: Secondary | ICD-10-CM | POA: Diagnosis not present

## 2021-09-20 DIAGNOSIS — Z7901 Long term (current) use of anticoagulants: Secondary | ICD-10-CM

## 2021-09-20 DIAGNOSIS — I890 Lymphedema, not elsewhere classified: Secondary | ICD-10-CM | POA: Diagnosis present

## 2021-09-20 DIAGNOSIS — R0600 Dyspnea, unspecified: Secondary | ICD-10-CM | POA: Diagnosis not present

## 2021-09-20 DIAGNOSIS — K760 Fatty (change of) liver, not elsewhere classified: Secondary | ICD-10-CM | POA: Diagnosis present

## 2021-09-20 DIAGNOSIS — Z6841 Body Mass Index (BMI) 40.0 and over, adult: Secondary | ICD-10-CM

## 2021-09-20 DIAGNOSIS — I110 Hypertensive heart disease with heart failure: Secondary | ICD-10-CM | POA: Diagnosis present

## 2021-09-20 DIAGNOSIS — R911 Solitary pulmonary nodule: Secondary | ICD-10-CM | POA: Diagnosis not present

## 2021-09-20 DIAGNOSIS — E785 Hyperlipidemia, unspecified: Secondary | ICD-10-CM | POA: Diagnosis not present

## 2021-09-20 DIAGNOSIS — R001 Bradycardia, unspecified: Secondary | ICD-10-CM | POA: Diagnosis not present

## 2021-09-20 DIAGNOSIS — M069 Rheumatoid arthritis, unspecified: Secondary | ICD-10-CM | POA: Diagnosis not present

## 2021-09-20 DIAGNOSIS — R0602 Shortness of breath: Secondary | ICD-10-CM | POA: Diagnosis not present

## 2021-09-20 DIAGNOSIS — D72829 Elevated white blood cell count, unspecified: Secondary | ICD-10-CM | POA: Diagnosis not present

## 2021-09-20 DIAGNOSIS — Z87891 Personal history of nicotine dependence: Secondary | ICD-10-CM | POA: Diagnosis not present

## 2021-09-20 DIAGNOSIS — F32A Depression, unspecified: Secondary | ICD-10-CM | POA: Diagnosis not present

## 2021-09-20 DIAGNOSIS — N390 Urinary tract infection, site not specified: Secondary | ICD-10-CM | POA: Diagnosis not present

## 2021-09-20 DIAGNOSIS — J9811 Atelectasis: Secondary | ICD-10-CM | POA: Diagnosis not present

## 2021-09-20 DIAGNOSIS — I4891 Unspecified atrial fibrillation: Secondary | ICD-10-CM | POA: Diagnosis not present

## 2021-09-20 DIAGNOSIS — F411 Generalized anxiety disorder: Secondary | ICD-10-CM | POA: Diagnosis present

## 2021-09-20 DIAGNOSIS — Z8249 Family history of ischemic heart disease and other diseases of the circulatory system: Secondary | ICD-10-CM

## 2021-09-20 DIAGNOSIS — I252 Old myocardial infarction: Secondary | ICD-10-CM | POA: Diagnosis not present

## 2021-09-20 DIAGNOSIS — R0789 Other chest pain: Secondary | ICD-10-CM | POA: Diagnosis not present

## 2021-09-20 DIAGNOSIS — Z9049 Acquired absence of other specified parts of digestive tract: Secondary | ICD-10-CM

## 2021-09-20 DIAGNOSIS — N179 Acute kidney failure, unspecified: Secondary | ICD-10-CM | POA: Diagnosis not present

## 2021-09-20 DIAGNOSIS — J9601 Acute respiratory failure with hypoxia: Secondary | ICD-10-CM | POA: Diagnosis present

## 2021-09-20 DIAGNOSIS — Z951 Presence of aortocoronary bypass graft: Secondary | ICD-10-CM

## 2021-09-20 DIAGNOSIS — Z89612 Acquired absence of left leg above knee: Secondary | ICD-10-CM

## 2021-09-20 DIAGNOSIS — Z79899 Other long term (current) drug therapy: Secondary | ICD-10-CM

## 2021-09-20 DIAGNOSIS — J811 Chronic pulmonary edema: Secondary | ICD-10-CM | POA: Diagnosis not present

## 2021-09-20 DIAGNOSIS — Z85828 Personal history of other malignant neoplasm of skin: Secondary | ICD-10-CM

## 2021-09-20 LAB — URINALYSIS, ROUTINE W REFLEX MICROSCOPIC
Bilirubin Urine: NEGATIVE
Glucose, UA: NEGATIVE mg/dL
Hgb urine dipstick: NEGATIVE
Ketones, ur: NEGATIVE mg/dL
Leukocytes,Ua: NEGATIVE
Nitrite: NEGATIVE
Protein, ur: NEGATIVE mg/dL
Specific Gravity, Urine: 1.005 (ref 1.005–1.030)
pH: 6 (ref 5.0–8.0)

## 2021-09-20 LAB — CBC
HCT: 45.7 % (ref 36.0–46.0)
Hemoglobin: 14.1 g/dL (ref 12.0–15.0)
MCH: 27.4 pg (ref 26.0–34.0)
MCHC: 30.9 g/dL (ref 30.0–36.0)
MCV: 88.9 fL (ref 80.0–100.0)
Platelets: 282 10*3/uL (ref 150–400)
RBC: 5.14 MIL/uL — ABNORMAL HIGH (ref 3.87–5.11)
RDW: 15.8 % — ABNORMAL HIGH (ref 11.5–15.5)
WBC: 14.8 10*3/uL — ABNORMAL HIGH (ref 4.0–10.5)
nRBC: 0 % (ref 0.0–0.2)

## 2021-09-20 LAB — BRAIN NATRIURETIC PEPTIDE: B Natriuretic Peptide: 304.3 pg/mL — ABNORMAL HIGH (ref 0.0–100.0)

## 2021-09-20 LAB — TROPONIN I (HIGH SENSITIVITY)
Troponin I (High Sensitivity): 11 ng/L (ref <18)
Troponin I (High Sensitivity): 11 ng/L (ref <18)

## 2021-09-20 LAB — TSH: TSH: 0.983 u[IU]/mL (ref 0.350–4.500)

## 2021-09-20 LAB — MAGNESIUM: Magnesium: 2 mg/dL (ref 1.7–2.4)

## 2021-09-20 MED ORDER — SODIUM CHLORIDE 0.9% FLUSH
3.0000 mL | INTRAVENOUS | Status: DC | PRN
Start: 2021-09-20 — End: 2021-09-25
  Administered 2021-09-21: 3 mL via INTRAVENOUS

## 2021-09-20 MED ORDER — FUROSEMIDE 10 MG/ML IJ SOLN
40.0000 mg | Freq: Once | INTRAMUSCULAR | Status: AC
  Administered 2021-09-20: 40 mg via INTRAVENOUS
  Filled 2021-09-20: qty 4

## 2021-09-20 MED ORDER — SODIUM CHLORIDE 0.9 % IV SOLN: 250.0000 mL | INTRAVENOUS | Status: DC | PRN

## 2021-09-20 MED ORDER — DILTIAZEM HCL 25 MG/5ML IV SOLN
10.0000 mg | Freq: Once | INTRAVENOUS | Status: AC
  Administered 2021-09-20: 10 mg via INTRAVENOUS
  Filled 2021-09-20: qty 5

## 2021-09-20 MED ORDER — SERTRALINE HCL 25 MG PO TABS
50.0000 mg | ORAL_TABLET | Freq: Every day | ORAL | Status: DC
  Administered 2021-09-21 – 2021-09-25 (×5): 50 mg via ORAL
  Filled 2021-09-20: qty 1
  Filled 2021-09-20 (×4): qty 2

## 2021-09-20 MED ORDER — IRBESARTAN 150 MG PO TABS
300.0000 mg | ORAL_TABLET | Freq: Every day | ORAL | Status: DC
  Administered 2021-09-21 – 2021-09-25 (×5): 300 mg via ORAL
  Filled 2021-09-20 (×4): qty 2
  Filled 2021-09-20: qty 1

## 2021-09-20 MED ORDER — APIXABAN 5 MG PO TABS
5.0000 mg | ORAL_TABLET | Freq: Two times a day (BID) | ORAL | Status: DC
  Administered 2021-09-21 – 2021-09-25 (×10): 5 mg via ORAL
  Filled 2021-09-20 (×10): qty 1

## 2021-09-20 MED ORDER — ACETAMINOPHEN 325 MG PO TABS: 650.0000 mg | ORAL_TABLET | ORAL | Status: DC | PRN

## 2021-09-20 MED ORDER — DOFETILIDE 125 MCG PO CAPS
125.0000 ug | ORAL_CAPSULE | Freq: Two times a day (BID) | ORAL | Status: DC
Start: 2021-09-21 — End: 2021-09-25
  Administered 2021-09-21 – 2021-09-25 (×10): 125 ug via ORAL
  Filled 2021-09-20 (×12): qty 1

## 2021-09-20 MED ORDER — ATORVASTATIN CALCIUM 10 MG PO TABS
20.0000 mg | ORAL_TABLET | Freq: Every day | ORAL | Status: DC
  Administered 2021-09-21 – 2021-09-25 (×5): 20 mg via ORAL
  Filled 2021-09-20 (×5): qty 2

## 2021-09-20 MED ORDER — ONDANSETRON HCL 4 MG/2ML IJ SOLN: 4.0000 mg | Freq: Four times a day (QID) | INTRAMUSCULAR | Status: DC | PRN

## 2021-09-20 MED ORDER — SODIUM CHLORIDE 0.9% FLUSH
3.0000 mL | Freq: Two times a day (BID) | INTRAVENOUS | Status: DC
Start: 2021-09-21 — End: 2021-09-25
  Administered 2021-09-21 – 2021-09-25 (×9): 3 mL via INTRAVENOUS

## 2021-09-20 MED ORDER — IOHEXOL 350 MG/ML SOLN
100.0000 mL | Freq: Once | INTRAVENOUS | Status: AC | PRN
  Administered 2021-09-20: 100 mL via INTRAVENOUS

## 2021-09-20 MED ORDER — FENOFIBRATE 160 MG PO TABS
160.0000 mg | ORAL_TABLET | Freq: Every day | ORAL | Status: DC
  Administered 2021-09-21 – 2021-09-25 (×5): 160 mg via ORAL
  Filled 2021-09-20 (×5): qty 1

## 2021-09-20 NOTE — Assessment & Plan Note (Signed)
New onset CHF in setting of recurrence of A.Fib today. Patient has acute decompensated CHF: Patient presents with: ORTHOPNEA and dyspnea on exertion / increased shortness of breath Exam findings include: PULMONARY CRACKLES and tachycardia Work up findings include: PULMONARY EDEMA ON CXR 1. CHF pathway 2. Tele monitor 3. Got lasix '40mg'$  IV x1 in ED 4. Rate control of A.Fib for the moment with cardizem '10mg'$  IV x1 done in ED 5. 2d echo ordered 6. Strict intake and output 7. BMP daily 8. Holding off on further lasix for the moment, will defer to cards 9. Message put in to P. Trent for cards eval in AM (Patient is an A.Fib patient of Dr. Rosezella [place]), to see how they want to proceed (diurese, rate control, or just cardiovert her back out of a.fib again?)

## 2021-09-20 NOTE — ED Provider Notes (Signed)
[name] Provider Note   CSN: [id number] Arrival date & time: 09/20/21  1323     History  No chief complaint on file.   Julia West is a 66 y.o. female.  HPI  Patient is a 66 year old female with a history of A-fib on Eliquis and dofetilide, left BKA, HTN, HLD, morbid obesity, CABG 2003 who presents emergency department for evaluation of shortness of breath.  Patient states that this morning at approximately 4 AM she was awoken from sleep feeling very short of breath.  She also had shortness of breath when walking around her house.  She denied any chest pain, lightheadedness, dizziness.  She denies any nausea, vomiting, diarrhea.  Patient took her oxygen saturation at home to be in the low 90s and 88.  Her heart rate was fluctuating.  Patient states that she originally was diagnosed with A-fib this back to October.  She was cardioverted while being evaluated at [facility].  However she did have a repeat of her atrial fibrillation and was ultimately placed on Eliquis and dofetilide.  She reports that she has been compliant with all of her home medications.  Patient now reports ongoing shortness of breath and continued palpitations.  She denies any chest pain lightheadedness or dizziness.     Home Medications Prior to Admission medications   Medication Sig Start Date End Date Taking? Authorizing Provider  amLODipine (NORVASC) 2.5 MG tablet Take 1 tablet (2.5 mg total) by mouth daily. 06/08/21   [name], MD  apixaban (ELIQUIS) 5 MG TABS tablet TAKE 1 TABLET BY MOUTH TWICE  DAILY 08/24/21   [name], MD  atorvastatin (LIPITOR) 20 MG tablet TAKE 1 TABLET BY MOUTH DAILY 09/07/21   [name], MD  BELSOMRA 20 MG TABS Take 1 tablet by mouth at bedtime as needed. 06/01/21   [name], MD  dofetilide (TIKOSYN) 125 MCG capsule Take 1 capsule (125 mcg total) by mouth 2 (two) times daily. 09/10/21   [name], MD   fenofibrate 160 MG tablet TAKE 1 TABLET BY MOUTH DAILY 09/07/21   [name], MD  fexofenadine (ALLEGRA) 180 MG tablet Take 1 tablet (180 mg total) by mouth daily. For allergy symptoms 08/19/20   [name], MD  fluticasone [facility]) 50 MCG/ACT nasal spray Place 1 spray into both nostrils daily as needed for allergies or rhinitis.    [provider]  furosemide (LASIX) 20 MG tablet Take 1 tablet (20 mg total) by mouth daily as needed. 05/13/21   [name], MD  nitroGLYCERIN (NITROSTAT) 0.4 MG SL tablet Place 1 tablet (0.4 mg total) under the tongue every 5 (five) minutes x 3 doses as needed for chest pain. If pain persist after call 911 01/08/16 12/17/20  [name], MD  sertraline (ZOLOFT) 50 MG tablet TAKE 1 TABLET BY MOUTH DAILY 09/07/21   [name], MD  valsartan (DIOVAN) 320 MG tablet TAKE 1 TABLET BY MOUTH DAILY 09/04/21   [name], MD      Allergies    Ace inhibitors    Review of Systems   Review of Systems  Physical Exam Updated Vital Signs BP (!) 147/92   Pulse (!) 106   Temp 98.2 F (36.8 C) (Oral)   Resp (!) 27   SpO2 91%  Physical Exam Vitals and nursing note reviewed.  Constitutional:      General: She is not in acute distress.    Appearance: She is well-developed. She is obese.  HENT:     Right Ear: External ear normal.     Left Ear: External ear normal.     Nose: Nose normal.     Mouth/Throat:     Mouth: Mucous membranes are moist.     Pharynx: Oropharynx is clear.  Eyes:     Extraocular Movements: Extraocular movements intact.     Conjunctiva/sclera: Conjunctivae normal.     Pupils: Pupils are equal, round, and reactive to light.  Cardiovascular:     Rate and Rhythm: Normal rate and regular rhythm.     Heart sounds: No murmur heard. Pulmonary:     Effort: Pulmonary effort is normal. No respiratory distress.     Breath sounds: Rales present.  Abdominal:     Palpations: Abdomen is soft.     Tenderness: There is no  abdominal tenderness. There is no guarding or rebound.  Musculoskeletal:        General: No swelling.     Cervical back: Neck supple.     Right lower leg: Edema present.     Comments: L BKA  Skin:    General: Skin is warm and dry.     Capillary Refill: Capillary refill takes less than 2 seconds.  Neurological:     General: No focal deficit present.     Mental Status: She is alert and oriented to person, place, and time.  Psychiatric:        Mood and Affect: Mood normal.     ED Results / Procedures / Treatments   Labs (all labs ordered are listed, but only abnormal results are displayed) Labs Reviewed  CBC - Abnormal; Notable for the following components:      Result Value   WBC 14.8 (*)    RBC 5.14 (*)    RDW 15.8 (*)    All other components within normal limits  BRAIN NATRIURETIC PEPTIDE - Abnormal; Notable for the following components:   B Natriuretic Peptide 304.3 (*)    All other components within normal limits  URINALYSIS, ROUTINE W REFLEX MICROSCOPIC - Abnormal; Notable for the following components:   Color, Urine STRAW (*)    All other components within normal limits  URINE CULTURE  MAGNESIUM  TSH  HIV ANTIBODY (ROUTINE TESTING W REFLEX)  BASIC METABOLIC PANEL  TROPONIN I (HIGH SENSITIVITY)  TROPONIN I (HIGH SENSITIVITY)    EKG EKG Interpretation  Date/Time:  Sunday September 20 2021 20:11:33 EDT Ventricular Rate:  123 PR Interval:    QRS Duration: 96 QT Interval:  326 QTC Calculation: 467 R Axis:   -13 Text Interpretation: Atrial fibrillation Low voltage, precordial leads Probable anteroseptal infarct, old Since last tracing rate faster Confirmed by Wandra Arthurs [phone]) on 09/20/2021 8:50:44 PM  Radiology CT Angio Chest PE W and/or Wo Contrast  Result Date: 09/20/2021 CLINICAL DATA:  Pulmonary embolism suspected, unknown D-dimer. EXAM: CT ANGIOGRAPHY CHEST WITH CONTRAST TECHNIQUE: Multidetector CT imaging of the chest was performed using the standard protocol  during bolus administration of intravenous contrast. Multiplanar CT image reconstructions and MIPs were obtained to evaluate the vascular anatomy. RADIATION DOSE REDUCTION: This exam was performed according to the departmental dose-optimization program which includes automated exposure control, adjustment of the mA and/or kV according to patient size and/or use of iterative reconstruction technique. CONTRAST:  155m OMNIPAQUE IOHEXOL 350 MG/ML SOLN COMPARISON:  None Available. FINDINGS: Cardiovascular: The heart is enlarged and there is no pericardial effusion. Three-vessel coronary artery calcification is noted. There is atherosclerotic calcification of the aorta  without evidence of aneurysm. The pulmonary trunk is normal in caliber. No pulmonary artery filling defect is identified. Mediastinum/Nodes: Prominent lymph nodes are noted in the periaortic space adjacent to the mid to distal descending aorta on the left measuring up to 1 cm. Nonspecific prominent lymph nodes are present in the hilar regions bilaterally. No axillary lymphadenopathy. Scattered coarse calcifications are noted in the thyroid gland. The trachea and esophagus are within normal limits. Lungs/Pleura: Hazy ground-glass attenuation is noted in the lungs bilaterally. Mild atelectasis is noted at the lung bases. No effusion or pneumothorax. There is a nodule in the right lower lobe measuring 7 mm, axial image 81. Upper Abdomen: There is reflux of contrast into the inferior vena cava and hepatic veins which may be associated with right heart failure. The gallbladder is surgically absent. A cyst is noted in the right upper quadrant measuring 8.9 cm, likely renal in origin. Musculoskeletal: Sternotomy wires are noted over the midline. Degenerative changes are present in the thoracic spine. No acute or suspicious osseous abnormality. Review of the MIP images confirms the above findings. IMPRESSION: 1. No evidence of pulmonary embolism. 2. Hazy  ground-glass attenuation in the lungs bilaterally, possible edema or infiltrate. 3. 7 mm right lower lobe pulmonary nodule. Non-contrast chest CT at 6-12 months is recommended. If the nodule is stable at time of repeat CT, then future CT at 18-24 months (from today's scan) is considered optional for low-risk patients, but is recommended for high-risk patients. This recommendation follows the consensus statement: Guidelines for Management of Incidental Pulmonary Nodules Detected on CT Images: From the Fleischner Society 2017; Radiology 2017; 284:228-243. 4. Cardiomegaly with coronary artery calcifications. 5. Aortic atherosclerosis. Electronically Signed   By: Brett Fairy M.D.   On: 09/20/2021 23:15   DG Chest 2 View  Result Date: 09/20/2021 CLINICAL DATA:  Shortness of breath.  Chest tightness. EXAM: CHEST - 2 VIEW COMPARISON:  None Available. FINDINGS: The cardio pericardial silhouette is enlarged. There is pulmonary vascular congestion without overt pulmonary edema. Interstitial markings are diffusely coarsened with chronic features. Streaky opacity at the bases suggest atelectasis or scarring. No focal consolidation or overt pleural effusion. IMPRESSION: Enlargement of the cardiopericardial silhouette with pulmonary vascular congestion. Electronically Signed   By: Misty Stanley M.D.   On: 09/20/2021 14:30    Procedures Procedures    Medications Ordered in ED Medications  apixaban (ELIQUIS) tablet 5 mg (has no administration in time range)  atorvastatin (LIPITOR) tablet 20 mg (has no administration in time range)  dofetilide (TIKOSYN) capsule 125 mcg (has no administration in time range)  fenofibrate tablet 160 mg (has no administration in time range)  sertraline (ZOLOFT) tablet 50 mg (has no administration in time range)  irbesartan (AVAPRO) tablet 300 mg (has no administration in time range)  sodium chloride flush (NS) 0.9 % injection 3 mL (has no administration in time range)  sodium  chloride flush (NS) 0.9 % injection 3 mL (has no administration in time range)  0.9 %  sodium chloride infusion (has no administration in time range)  acetaminophen (TYLENOL) tablet 650 mg (has no administration in time range)  ondansetron (ZOFRAN) injection 4 mg (has no administration in time range)  metoprolol tartrate (LOPRESSOR) injection 5 mg (has no administration in time range)  furosemide (LASIX) injection 40 mg (40 mg Intravenous Given 09/20/21 1959)  diltiazem (CARDIZEM) injection 10 mg (10 mg Intravenous Given 09/20/21 2115)  iohexol (OMNIPAQUE) 350 MG/ML injection 100 mL (100 mLs Intravenous Contrast Given 09/20/21 2258)  ED Course/ Medical Decision Making/ A&P                           Medical Decision Making Problems Addressed: Atrial fibrillation, unspecified type [facility]): complicated acute illness or injury with systemic symptoms that poses a threat to life or bodily functions Dyspnea, unspecified type: complicated acute illness or injury with systemic symptoms that poses a threat to life or bodily functions  Amount and/or Complexity of Data Reviewed Independent Historian: caregiver External Data Reviewed: labs, radiology and notes. Labs: ordered. Radiology: ordered. ECG/medicine tests: ordered.  Risk Prescription drug management.   Patient is a 66 year old female who presents emergency department as above.  On initial presentation patient's vital signs are stable and he is afebrile.  Patient is saturating low 90s on room air and she was placed on 2 LNC.  Her EKG and rhythm strip showed that she was in atrial fibrillation.  According to the patient she has not been in A-fib and has been in sinus rhythm at home since her most recent admission.  Patient did have some rales on physical exam.  She had a edema to her right lower leg, however this was felt more chronic in nature.  Suspicion is highest for conversion into atrial fibrillation.  Possibility for underlying infection  versus new heart failure versus ACS versus PE.  Lower suspicion for a reassuring EKG without acute ST or T wave abnormalities, absence of chest pain.  Baseline laboratory work ordered from triage also showed a delta troponin which was 11 and 11.  Her BNP was initially elevated slightly at 3-4.3.  Her CBC showed a slight leukocytosis of 14.8 but hemoglobin was stable at 14.1.    Patient was provided with 1 dose of Cardizem while in the emergency department as she went into A-fib with RVR with rates into the 120s.  This did ultimately improve her RVR and she decreased to rates in the 110s to 90s.  She was also given 40 mg of IV Lasix in the setting of slightly elevated BNP and some component of fluid overload.  UA was also ordered for this patient which did not show any signs of infection.  Magnesium 2.0.  Although patient was on Eliquis given her new oxygen requirement and shortness of breath possibility existed for a PE and for this reason she was ordered a CT PE with and without contrast.  This study did not show any evidence of PE but did show possible edema in the bilateral lungs and a 7 mm nodule on her right lower lung lobe.  In the setting of the patient's new oxygen requirement and conversion from sinus back into atrial fibrillation likely this morning she was felt appropriate for admission to the hospital for further management and work-up.  Plan discussed with patient and daughter at bedside who both verbalized understanding and were agreeable with the plan.  Hospitalist was contacted for admission.  Please see inpatient provider notes for additional details regarding this patient's ongoing hospital course and management.            Final Clinical Impression(s) / ED Diagnoses Final diagnoses:  Atrial fibrillation, unspecified type ([place])  Dyspnea, unspecified type    Rx / DC Orders ED Discharge Orders     None         Nelta Numbers, MD 09/21/21 0002    Drenda Freeze, MD 09/22/21 [phone]

## 2021-09-20 NOTE — Assessment & Plan Note (Addendum)
Continue blood pressure control with valsartan and carvedilol. Continue diuresis with furosemide.   Dyslipidemia, continue with statin therapy.

## 2021-09-20 NOTE — Telephone Encounter (Signed)
Patient's daughter [name] called after service, reporting patient has been having significant shortness of breath, elevated blood pressure, irregular heart rate.  She had taken the patient to the ER.  She is frustrated ER is not monitoring the patient, felt her EKG is abnormal, reports patient has atrial fibrillation, wants to know what she can do.  Reassured patient's daughter that she did appropriate thing for taking the patient to the ER.  Advised patient's daughter to communicate with ER provider regarding her concern and frustration.

## 2021-09-20 NOTE — ED Triage Notes (Signed)
Patient awoke this am with sob and felt like she was gasping for air. Denies cp and her oxygen saturation was in the 90s and HR fluctuating, hx of atrial fib. Alert and oriented. Right leg more swollen then normal

## 2021-09-20 NOTE — ED Notes (Addendum)
Laid pt flat to place purewick.  Pt became tachy in 140's and sob.  Hr and sob recovered after several minutes.  MD notified.

## 2021-09-20 NOTE — ED Provider Triage Note (Signed)
Emergency Medicine Provider Triage Evaluation Note  Julia West , a 66 y.o. female  was evaluated in triage.  Pt complains of shortness of breath.  History of A-fib on Tikosyn and Eliquis.  She has noticed increasing swelling in her right leg.  She is status post BKA on the right.  She has had variable heart rate of 44 up to 104.  Woke up this morning with paroxysmal nocturnal dyspnea and gasping for air, worsening exertional dyspnea over the past few days.  Review of Systems  Positive: Shortness of breath Negative: Weakness  Physical Exam  BP (!) 128/99 (BP Location: Left Arm)   Pulse 64   Temp 98.2 F (36.8 C) (Oral)   Resp 15   SpO2 93%  Gen:   Awake, no distress   Resp:  Normal effort  MSK:   Moves extremities without difficulty  Other:  Peripheral edema right lower extremity  Medical Decision Making  Medically screening exam initiated at 1:50 PM.  Appropriate orders placed.  Julia West was informed that the remainder of the evaluation will be completed by another provider, this initial triage assessment does not replace that evaluation, and the importance of remaining in the ED until their evaluation is complete.  Work-up initiated   Margarita Mail, PA-C 09/20/21 1404

## 2021-09-20 NOTE — Assessment & Plan Note (Addendum)
Patient has converted to sinus rhythm. EKG with junctional PVC and PAC.    Plan to continue rhythm control with dofetilide and  carvedilol. Anticoagulation with apixaban.  Patient will follow up with electrophysiology as outpatient

## 2021-09-20 NOTE — Assessment & Plan Note (Signed)
Chronic. Consider starting Wegovy as outpt if affordable.

## 2021-09-20 NOTE — Assessment & Plan Note (Addendum)
Acute coronary syndrome was ruled out.

## 2021-09-20 NOTE — ED Notes (Signed)
Pt accompanied to CT

## 2021-09-20 NOTE — ED Notes (Signed)
Called CT.  They are unable to take pt at this time, but will call when room becomes available.

## 2021-09-21 ENCOUNTER — Observation Stay (HOSPITAL_COMMUNITY): Payer: Medicare Other

## 2021-09-21 ENCOUNTER — Other Ambulatory Visit (HOSPITAL_COMMUNITY): Payer: Self-pay

## 2021-09-21 ENCOUNTER — Encounter (HOSPITAL_COMMUNITY): Payer: Self-pay | Admitting: Internal Medicine

## 2021-09-21 DIAGNOSIS — I2510 Atherosclerotic heart disease of native coronary artery without angina pectoris: Secondary | ICD-10-CM | POA: Diagnosis present

## 2021-09-21 DIAGNOSIS — Z20822 Contact with and (suspected) exposure to covid-19: Secondary | ICD-10-CM | POA: Diagnosis present

## 2021-09-21 DIAGNOSIS — F32A Depression, unspecified: Secondary | ICD-10-CM | POA: Diagnosis present

## 2021-09-21 DIAGNOSIS — I509 Heart failure, unspecified: Secondary | ICD-10-CM

## 2021-09-21 DIAGNOSIS — E785 Hyperlipidemia, unspecified: Secondary | ICD-10-CM | POA: Diagnosis present

## 2021-09-21 DIAGNOSIS — I110 Hypertensive heart disease with heart failure: Secondary | ICD-10-CM | POA: Diagnosis present

## 2021-09-21 DIAGNOSIS — Z6841 Body Mass Index (BMI) 40.0 and over, adult: Secondary | ICD-10-CM | POA: Diagnosis not present

## 2021-09-21 DIAGNOSIS — I4819 Other persistent atrial fibrillation: Secondary | ICD-10-CM | POA: Diagnosis present

## 2021-09-21 DIAGNOSIS — I10 Essential (primary) hypertension: Secondary | ICD-10-CM

## 2021-09-21 DIAGNOSIS — I890 Lymphedema, not elsewhere classified: Secondary | ICD-10-CM | POA: Diagnosis present

## 2021-09-21 DIAGNOSIS — I252 Old myocardial infarction: Secondary | ICD-10-CM | POA: Diagnosis not present

## 2021-09-21 DIAGNOSIS — I4891 Unspecified atrial fibrillation: Secondary | ICD-10-CM | POA: Diagnosis present

## 2021-09-21 DIAGNOSIS — Z87891 Personal history of nicotine dependence: Secondary | ICD-10-CM | POA: Diagnosis not present

## 2021-09-21 DIAGNOSIS — I5023 Acute on chronic systolic (congestive) heart failure: Secondary | ICD-10-CM | POA: Diagnosis present

## 2021-09-21 DIAGNOSIS — Z888 Allergy status to other drugs, medicaments and biological substances status: Secondary | ICD-10-CM | POA: Diagnosis not present

## 2021-09-21 DIAGNOSIS — K760 Fatty (change of) liver, not elsewhere classified: Secondary | ICD-10-CM | POA: Diagnosis present

## 2021-09-21 DIAGNOSIS — D72829 Elevated white blood cell count, unspecified: Secondary | ICD-10-CM | POA: Diagnosis not present

## 2021-09-21 DIAGNOSIS — B9620 Unspecified Escherichia coli [E. coli] as the cause of diseases classified elsewhere: Secondary | ICD-10-CM | POA: Diagnosis present

## 2021-09-21 DIAGNOSIS — N390 Urinary tract infection, site not specified: Secondary | ICD-10-CM | POA: Diagnosis present

## 2021-09-21 DIAGNOSIS — R001 Bradycardia, unspecified: Secondary | ICD-10-CM | POA: Diagnosis not present

## 2021-09-21 DIAGNOSIS — J9601 Acute respiratory failure with hypoxia: Secondary | ICD-10-CM | POA: Diagnosis present

## 2021-09-21 DIAGNOSIS — F411 Generalized anxiety disorder: Secondary | ICD-10-CM | POA: Diagnosis present

## 2021-09-21 DIAGNOSIS — Z7901 Long term (current) use of anticoagulants: Secondary | ICD-10-CM | POA: Diagnosis not present

## 2021-09-21 DIAGNOSIS — R911 Solitary pulmonary nodule: Secondary | ICD-10-CM | POA: Diagnosis present

## 2021-09-21 DIAGNOSIS — Z79899 Other long term (current) drug therapy: Secondary | ICD-10-CM | POA: Diagnosis not present

## 2021-09-21 DIAGNOSIS — N179 Acute kidney failure, unspecified: Secondary | ICD-10-CM | POA: Diagnosis not present

## 2021-09-21 DIAGNOSIS — M069 Rheumatoid arthritis, unspecified: Secondary | ICD-10-CM | POA: Diagnosis present

## 2021-09-21 LAB — COMPREHENSIVE METABOLIC PANEL
ALT: 16 U/L (ref 0–44)
AST: 20 U/L (ref 15–41)
Albumin: 3.5 g/dL (ref 3.5–5.0)
Alkaline Phosphatase: 41 U/L (ref 38–126)
Anion gap: 16 — ABNORMAL HIGH (ref 5–15)
BUN: 17 mg/dL (ref 8–23)
CO2: 23 mmol/L (ref 22–32)
Calcium: 8.9 mg/dL (ref 8.9–10.3)
Chloride: 101 mmol/L (ref 98–111)
Creatinine, Ser: 1.03 mg/dL — ABNORMAL HIGH (ref 0.44–1.00)
GFR, Estimated: 59 mL/min — ABNORMAL LOW (ref >60)
Glucose, Bld: 151 mg/dL — ABNORMAL HIGH (ref 70–99)
Potassium: 3.6 mmol/L (ref 3.5–5.1)
Sodium: 140 mmol/L (ref 135–145)
Total Bilirubin: 0.8 mg/dL (ref 0.3–1.2)
Total Protein: 6.7 g/dL (ref 6.5–8.1)

## 2021-09-21 LAB — ECHOCARDIOGRAM COMPLETE
Height: 66 in
S' Lateral: 3.95 cm
Weight: 4880 oz

## 2021-09-21 LAB — HIV ANTIBODY (ROUTINE TESTING W REFLEX): HIV Screen 4th Generation wRfx: NONREACTIVE

## 2021-09-21 LAB — SARS CORONAVIRUS 2 BY RT PCR: SARS Coronavirus 2 by RT PCR: NEGATIVE

## 2021-09-21 LAB — MAGNESIUM: Magnesium: 2 mg/dL (ref 1.7–2.4)

## 2021-09-21 LAB — SURGICAL PCR SCREEN
MRSA, PCR: POSITIVE — AB
Staphylococcus aureus: POSITIVE — AB

## 2021-09-21 LAB — TSH: TSH: 0.951 u[IU]/mL (ref 0.350–4.500)

## 2021-09-21 LAB — PROCALCITONIN: Procalcitonin: <0.1 ng/mL

## 2021-09-21 MED ORDER — METOPROLOL TARTRATE 5 MG/5ML IV SOLN
5.0000 mg | Freq: Four times a day (QID) | INTRAVENOUS | Status: DC | PRN
Start: 2021-09-21 — End: 2021-09-25
  Administered 2021-09-21 (×2): 5 mg via INTRAVENOUS
  Filled 2021-09-21 (×2): qty 5

## 2021-09-21 MED ORDER — POTASSIUM CHLORIDE CRYS ER 20 MEQ PO TBCR: 40.0000 meq | EXTENDED_RELEASE_TABLET | Freq: Once | ORAL | Status: DC

## 2021-09-21 MED ORDER — CHLORHEXIDINE GLUCONATE CLOTH 2 % EX PADS
6.0000 | MEDICATED_PAD | Freq: Every day | CUTANEOUS | Status: DC
  Administered 2021-09-22 – 2021-09-25 (×4): 6 via TOPICAL

## 2021-09-21 MED ORDER — MUPIROCIN 2 % EX OINT
1.0000 | TOPICAL_OINTMENT | Freq: Two times a day (BID) | CUTANEOUS | Status: DC
  Administered 2021-09-22 – 2021-09-25 (×8): 1 via NASAL
  Filled 2021-09-21 (×4): qty 22

## 2021-09-21 MED ORDER — FUROSEMIDE 10 MG/ML IJ SOLN
40.0000 mg | Freq: Once | INTRAMUSCULAR | Status: AC
  Administered 2021-09-21: 40 mg via INTRAVENOUS
  Filled 2021-09-21: qty 4

## 2021-09-21 MED ORDER — SPIRONOLACTONE 12.5 MG HALF TABLET: 12.5000 mg | ORAL_TABLET | Freq: Every day | ORAL | Status: DC

## 2021-09-21 MED ORDER — POTASSIUM CHLORIDE CRYS ER 20 MEQ PO TBCR
40.0000 meq | EXTENDED_RELEASE_TABLET | Freq: Two times a day (BID) | ORAL | Status: AC
  Administered 2021-09-21 (×2): 40 meq via ORAL
  Filled 2021-09-21 (×2): qty 2

## 2021-09-21 MED ORDER — SODIUM CHLORIDE 0.9 % IV SOLN: INTRAVENOUS | Status: DC

## 2021-09-21 NOTE — Consult Note (Addendum)
Cardiology Consultation:   Patient ID: Julia West MRN: [id number]; DOB: 05-02-1955  Admit date: 09/20/2021 Date of Consult: 09/21/2021  PCP:  [name], MD   [facility] Providers Cardiologist:  [name], MD  Electrophysiologist:  [name], MD  {  Patient Profile:   Julia West is a 66 y.o. 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 Alcario Drought.  History of MI 2nd ruptured plaque CFX 2003 s/p CABG w/ LIMA-LAD & RIMA-CFX. Low risk stress test on 2013.  Initially diagnosed with atrial fibrillation in October 2022.  Underwent cardioversion.  Had a recurrent atrial fibrillation and loaded with Tikosyn with successful cardioversion.  Heart rate in 50s while in sinus rhythm.  Last seen by Dr. Percival Spanish 05/13/2021.  Ordered sleep study for daytime somnolence and fatigue.  Given Lasix for lower extremity edema.  She has not completed sleep study left lower extremity prosthesis.  She does not have support at center. Recommended home study.   History of Present Illness:   [name] woke up 4 AM yesterday with palpitation and shortness of breath.  No chest pain.  Fluttering sensation.  Right leg more swollen than usual.  Presented to ER for further evaluation.  Chest X-ray with pulmonary vascular congestion CT angio without pulmonary embolism showing infiltrate versus edema  Admitted for further evaluation.  She got IV Lasix x1 yesterday.  Breathing minimally improved.  Also got IV Cardizem 10 mg x 1.  Tikosyn continued.  Reports compliance with Eliquis.  Got p.m. dose at midnight.  Pending morning dose.  Denies recent illness, fever, chills or congestion. Undergoing  echo.  Patient use prosthesis and crutches for ambulation.  Past Medical History:  Diagnosis Date   Acute myocardial infarction    with a ruptured plaque in the circumflex in 2003   Anxiety    Cancer ([employer])    basal cell face   Complication of anesthesia    states low O2 sats post-op 11/13, always slow to awaken   Degenerative joint disease    Dehiscence of amputation stump (HCC)    left BKA   Depression    Fatty liver    HTN (hypertension)    Hyperlipidemia LDL goal <70    Incarcerated hernia    Internal and external hemorrhoids without complication    Lymphedema of arm    right   Morbid obesity (HCC)    Rheumatoid arthritis ([place])    Surgical wound, non healing ABDOMINAL    has wound vac @ 125 mm Hg   Ventral hernia     Past Surgical History:  Procedure Laterality Date   AMPUTATION Left 05/06/2017   Procedure: BELOW KNEE AMPUTATION;  Surgeon: Newt Minion, MD;  Location: [place];  Service: Orthopedics;  Laterality: Left;   APPLICATION OF WOUND VAC     BOWEL RESECTION  02/07/2012   Procedure: SMALL BOWEL RESECTION;  Surgeon: Gayland Curry, MD,FACS;  Location: [place];  Service: General;;   CARDIOVASCULAR STRESS TEST  01-17-2012  DR HOCHREIN   LOW RISK NUCLEAR TEST   CARDIOVERSION N/A 07/14/2020   Procedure: CARDIOVERSION;  Surgeon: Arnoldo Lenis, MD;  Location: [facility];  Service: Endoscopy;  Laterality: N/A;   CESAREAN SECTION  x 4 in remote past   CHOLECYSTECTOMY     CORONARY ARTERY BYPASS GRAFT  2003   by Dr. Servando Snare. LIMA to the LAD, free RIMA to the circumflex. Stress perfusion study December 2009 with no high-risk areas of ischemia. She has a well-preserved ejection fraction   HYSTEROSCOPY WITH D & C N/A 05/14/2014   Procedure: DILATATION AND CURETTAGE (no specimen); HYSTEROSCOPY;  Surgeon: Jonnie Kind, MD;  Location: [facility];  Service: Gynecology;  Laterality: N/A;   I & D EXTREMITY Left 06/21/2017   Procedure: IRRIGATION AND DEBRIDEMENT OF LEFT LEG AMPUTATION  SITE;  Surgeon: Wallace Going, DO;  Location: [facility];  Service: Plastics;  Laterality: Left;   INCISION AND DRAINAGE OF WOUND  04/17/2012   Procedure: IRRIGATION AND DEBRIDEMENT WOUND;  Surgeon: Theodoro Kos, DO;  Location: [place];  Service: Plastics;  Laterality: N/A;  OF ABDOMINAL WOUND, SURGICAL PREP AND PLACEMENT OF VAC, REMOVAL FOREHEAD SKIN LESION   INCISION AND DRAINAGE OF WOUND N/A 04/24/2012   Procedure: IRRIGATION AND DEBRIDEMENT WOUND;  Surgeon: Theodoro Kos, DO;  Location: [place];  Service: Plastics;  Laterality: N/A;  I & D ABDOMINAL WOUND WITH VAC AND ACELL   INCISION AND DRAINAGE OF WOUND N/A 05/01/2012   Procedure: IRRIGATION AND DEBRIDEMENT WOUND;  Surgeon: Theodoro Kos, DO;  Location: [place];  Service: Plastics;  Laterality: N/A;  WITH SURGICAL PREP AND PLACEMENT OF VAC   INCISION AND DRAINAGE OF WOUND N/A 05/08/2012   Procedure: IRRIGATION AND DEBRIDEMENT OF ABD WOUND SURGICAL PREP AND PLACEMENT OF VAC ;  Surgeon: Theodoro Kos, DO;  Location: [place];  Service: Plastics;  Laterality: N/A;  IRRIGATION AND DEBRIDEMENT OF ABD WOUND SURGICAL PREP AND PLACEMENT OF VAC    INCISION AND DRAINAGE OF WOUND N/A 05/15/2012   Procedure: IRRIGATION AND DEBRIDEMENT OF ABDOMINAL WOUND WITH POSSIBLE SURGICAL PREP AND PLACEMENT OF VAC;  Surgeon: Theodoro Kos, DO;  Location: [place];  Service: Plastics;  Laterality: N/A;   INCISION AND DRAINAGE OF WOUND N/A 05/22/2012   Procedure: IRRIGATION AND DEBRIDEMENT OF ABODOMINAL WOUND WITH  SURGICAL PREP AND VAC PLACEMENT;  Surgeon: Theodoro Kos, DO;  Location: [place];  Service: Plastics;  Laterality: N/A;   INCISION AND DRAINAGE OF WOUND N/A 06/28/2012   Procedure: IRRIGATION AND DEBRIDEMENT OF ABDOMINAL ULCER SURGICAL PREP AND PLACEMENT OF ACELL AND VAC;  Surgeon: Theodoro Kos, DO;  Location: [place];  Service:  Plastics;  Laterality: N/A;   INCISIONAL HERNIA REPAIR  02/07/2012   Procedure: HERNIA REPAIR INCISIONAL;  Surgeon: Gayland Curry, MD,FACS;  Location: [place];  Service: General;;  Open, Primary repair, strangulated Incisional hernia.   LESION REMOVAL  04/17/2012   Procedure: LESION REMOVAL;  Surgeon: Theodoro Kos, DO;  Location: [place];  Service: Plastics;  Laterality: N/A;   CENTER OF FOREHEAD, REMOVAL FORHEAD SKIN LESION   ORIF TOE FRACTURE Left 02/09/2017   Procedure: EXCISION TALAR HEAD, INTERNAL FIXATION MEDIAL COLUMN LEFT FOOT;  Surgeon: Newt Minion, MD;  Location: [place];  Service: Orthopedics;  Laterality: Left;   POLYPECTOMY N/A 05/14/2014   Procedure: ENDOMETRIAL POLYPECTOMY;  Surgeon: Jonnie Kind, MD;  Location: [facility];  Service: Gynecology;  Laterality: N/A;   STUMP REVISION Left 08/31/2017   Procedure: REVISION LEFT BELOW KNEE AMPUTATION;  Surgeon: Newt Minion, MD;  Location: [place];  Service: Orthopedics;  Laterality: Left;    Inpatient  Medications: Scheduled Meds:  apixaban  5 mg Oral BID   atorvastatin  20 mg Oral Daily   dofetilide  125 mcg Oral BID   fenofibrate  160 mg Oral Daily   irbesartan  300 mg Oral Daily   sertraline  50 mg Oral Daily   sodium chloride flush  3 mL Intravenous Q12H   Continuous Infusions:  sodium chloride     PRN Meds: sodium chloride, acetaminophen, metoprolol tartrate, ondansetron (ZOFRAN) IV, sodium chloride flush  Allergies:    Allergies  Allergen Reactions   Ace Inhibitors Cough    Social History:   Social History   Socioeconomic History   Marital status: Married    Spouse name: Not on file   Number of children: 5   Years of education: Not on file   Highest education level: Not on file  Occupational History   Occupation: Estate manager/land agent    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 5 children. One of whom has pulmonary stenosis.    Left BKA, uses crutches - doesn't get much exercise   Social Determinants of Health   Financial Resource Strain: Low Risk  (10/01/2020)   Overall Financial Resource Strain (CARDIA)    Difficulty of Paying Living Expenses: Not hard at all  Food Insecurity: No Food Insecurity (10/01/2020)   Hunger Vital Sign    Worried About Running Out of Food in the Last Year: Never true    Ran Out of Food in the Last Year: Never true  Transportation Needs: No Transportation Needs (10/01/2020)   PRAPARE - Hydrologist (Medical): No    Lack of Transportation (Non-Medical): No  Physical Activity: Insufficiently Active (10/01/2020)   Exercise Vital Sign    Days of Exercise per Week: 7 days    Minutes of Exercise per Session: 10 min  Stress: No Stress Concern Present (10/01/2020)   [place]    Feeling of Stress : Not at all  Social Connections: Moderately Isolated (10/01/2020)   Social Connection and Isolation Panel [NHANES]    Frequency of Communication with Friends and Family: More than three times a week    Frequency of Social Gatherings with Friends and Family: Twice a week    Attends Religious Services: Never    Marine scientist or Organizations: No    Attends Archivist Meetings: Never    Marital Status: Married  Human resources officer Violence: Not At Risk (10/01/2020)   Humiliation, Afraid, Rape, and Kick questionnaire    Fear of Current or Ex-Partner:  No    Emotionally Abused: No    Physically Abused: No    Sexually Abused: No    Family History:   Family History  Problem Relation Age of Onset   Heart attack Mother  Hypertension Mother    Heart disease Mother    Sudden death Mother    Obesity Mother    Hypertension Sister    Diabetes Brother    Birth defects Son        heart defect     ROS:  Please see the history of present illness.  All other ROS reviewed and negative.     Physical Exam/Data:   Vitals:   09/21/21 0700 09/21/21 0746 09/21/21 0804 09/21/21 0900  BP: (!) 130/97   115/75  Pulse: 98   95  Resp: 18   (!) 21  Temp:  97.6 F (36.4 C)    TempSrc:  Oral    SpO2: 93%   94%  Weight:   (!) 138.3 kg   Height:   '5\' 6"'$  (1.676 m)     Intake/Output Summary (Last 24 hours) at 09/21/2021 1111 Last data filed at 09/20/2021 2300 Gross per 24 hour  Intake --  Output 1300 ml  Net -1300 ml      09/21/2021    8:04 AM 05/13/2021   10:46 AM 12/17/2020   10:34 AM  Last 3 Weights  Weight (lbs) 305 lb 305 lb 287 lb  Weight (kg) 138.347 kg 138.347 kg 130.182 kg     Body mass index is 49.23 kg/m.  General:  Well nourished, well developed, in no acute distress HEENT: normal Neck: JVD difficult to evaluate due to body great Vascular: No carotid bruits; Distal pulses 2+ bilaterally Cardiac:  normal S1, S2; RRR; no murmur  Lungs: Difficult examination due to laying on a bed during echo  abd: soft, nontender, no hepatomegaly  Ext: 2-3+ RLE edema Musculoskeletal: s/p L BKA Skin: warm and dry  Neuro:  CNs 2-12 intact, no focal abnormalities noted Psych:  Normal affect   EKG:  The EKG was personally reviewed and demonstrates:  Atrial fibrillation  Telemetry:  Telemetry was personally reviewed and demonstrates:  Atrial fibrillation   Relevant CV Studies:  Echo 06/2020 1. Left ventricular ejection fraction, by estimation, is 50 to 55%. The  left ventricle has normal function. The left ventricle has no regional  wall  motion abnormalities. Left ventricular diastolic parameters are  indeterminate.   2. Right ventricular systolic function is normal. The right ventricular  size is normal. There is normal pulmonary artery systolic pressure.   3. Left atrial size was mildly dilated.   4. The mitral valve is normal in structure. Mild mitral valve  regurgitation. No evidence of mitral stenosis.   5. The aortic valve has an indeterminant number of cusps. There is mild  calcification of the aortic valve. There is mild thickening of the aortic  valve. Aortic valve regurgitation is mild. No aortic stenosis is present.   6. The inferior vena cava is normal in size with greater than 50%  respiratory variability, suggesting right atrial pressure of 3 mmHg.   Laboratory Data:  High Sensitivity Troponin:   Recent Labs  Lab 09/20/21 1323 09/20/21 1355  TROPONINIHS 11 11     Chemistry Recent Labs  Lab 09/20/21 1355 09/21/21 0042  NA  --  140  K  --  3.6  CL  --  101  CO2  --  23  GLUCOSE  --  151*  BUN  --  17  CREATININE  --  1.03*  CALCIUM  --  8.9  MG 2.0  --   GFRNONAA  --  59*  ANIONGAP  --  16*  Recent Labs  Lab 09/21/21 0042  PROT 6.7  ALBUMIN 3.5  AST 20  ALT 16  ALKPHOS 41  BILITOT 0.8   Hematology Recent Labs  Lab 09/20/21 1355  WBC 14.8*  RBC 5.14*  HGB 14.1  HCT 45.7  MCV 88.9  MCH 27.4  MCHC 30.9  RDW 15.8*  PLT 282   Thyroid  Recent Labs  Lab 09/20/21 1355  TSH 0.983    BNP Recent Labs  Lab 09/20/21 1355  BNP 304.3*    Radiology/Studies:  CT Angio Chest PE W and/or Wo Contrast  Result Date: 09/20/2021 CLINICAL DATA:  Pulmonary embolism suspected, unknown D-dimer. EXAM: CT ANGIOGRAPHY CHEST WITH CONTRAST TECHNIQUE: Multidetector CT imaging of the chest was performed using the standard protocol during bolus administration of intravenous contrast. Multiplanar CT image reconstructions and MIPs were obtained to evaluate the vascular anatomy. RADIATION DOSE  REDUCTION: This exam was performed according to the departmental dose-optimization program which includes automated exposure control, adjustment of the mA and/or kV according to patient size and/or use of iterative reconstruction technique. CONTRAST:  175m OMNIPAQUE IOHEXOL 350 MG/ML SOLN COMPARISON:  None Available. FINDINGS: Cardiovascular: The heart is enlarged and there is no pericardial effusion. Three-vessel coronary artery calcification is noted. There is atherosclerotic calcification of the aorta without evidence of aneurysm. The pulmonary trunk is normal in caliber. No pulmonary artery filling defect is identified. Mediastinum/Nodes: Prominent lymph nodes are noted in the periaortic space adjacent to the mid to distal descending aorta on the left measuring up to 1 cm. Nonspecific prominent lymph nodes are present in the hilar regions bilaterally. No axillary lymphadenopathy. Scattered coarse calcifications are noted in the thyroid gland. The trachea and esophagus are within normal limits. Lungs/Pleura: Hazy ground-glass attenuation is noted in the lungs bilaterally. Mild atelectasis is noted at the lung bases. No effusion or pneumothorax. There is a nodule in the right lower lobe measuring 7 mm, axial image 81. Upper Abdomen: There is reflux of contrast into the inferior vena cava and hepatic veins which may be associated with right heart failure. The gallbladder is surgically absent. A cyst is noted in the right upper quadrant measuring 8.9 cm, likely renal in origin. Musculoskeletal: Sternotomy wires are noted over the midline. Degenerative changes are present in the thoracic spine. No acute or suspicious osseous abnormality. Review of the MIP images confirms the above findings. IMPRESSION: 1. No evidence of pulmonary embolism. 2. Hazy ground-glass attenuation in the lungs bilaterally, possible edema or infiltrate. 3. 7 mm right lower lobe pulmonary nodule. Non-contrast chest CT at 6-12 months is  recommended. If the nodule is stable at time of repeat CT, then future CT at 18-24 months (from today's scan) is considered optional for low-risk patients, but is recommended for high-risk patients. This recommendation follows the consensus statement: Guidelines for Management of Incidental Pulmonary Nodules Detected on CT Images: From the Fleischner Society 2017; Radiology 2017; 284:228-243. 4. Cardiomegaly with coronary artery calcifications. 5. Aortic atherosclerosis. Electronically Signed   By: LBrett FairyM.D.   On: 09/20/2021 23:15   DG Chest 2 View  Result Date: 09/20/2021 CLINICAL DATA:  Shortness of breath.  Chest tightness. EXAM: CHEST - 2 VIEW COMPARISON:  None Available. FINDINGS: The cardio pericardial silhouette is enlarged. There is pulmonary vascular congestion without overt pulmonary edema. Interstitial markings are diffusely coarsened with chronic features. Streaky opacity at the bases suggest atelectasis or scarring. No focal consolidation or overt pleural effusion. IMPRESSION: Enlargement of the cardiopericardial silhouette with pulmonary vascular  congestion. Electronically Signed   By: Misty Stanley M.D.   On: 09/20/2021 14:30     Assessment and Plan:   Atrial fibrillation with rapid ventricular rate - Sudden onset palpitation and shortness of breath yesterday morning.  Recently feeling fatigue and tired.   - Hr Improved after IV Cardizem 10 mg x 1 yesterday -Continue Tikosyn 125 mcg twice a day -Can add rate control agent while admitted.  Heart rate in 50s while in sinus rhythm. -Continue Eliquis for anticoagulation.  Reports compliance.  Needs morning dose. -Pending echo reading - Check TSH - Will consider cardioversion   2.  Snoring/daytime tiredness - She was unable to complete sleep study as he has prosthesis and requiring support at sleep center.  Recommended home sleep study.  3.  Right lower extremity swelling -Patient has chronic right lower extremity edema due  to lymphedema -Recently worsened -Chest x-ray concerning for pulmonary vascular congestion -She got IV Lasix 40 mg yesterday -Additional diuretic based on echo  4. HTN - BP stable on ARB  5. CAD s/p CABG x 2 in 2003 - Last stress test in 2013 was low risk - No chest pain  - Not on ASA due to need of anticoagulation - Not on BB due to baseline bradycardia while in sinus - Continue statin    Risk Assessment/Risk Scores:   New York Heart Association (NYHA) Functional Class NYHA Class II  CHA2DS2-VASc Score = 4  This indicates a 4.8% annual risk of stroke. The patient's score is based upon: CHF History: 0 HTN History: 1 Diabetes History: 0 Stroke History: 0 Vascular Disease History: 1 Age Score: 1 Gender Score: 1   For questions or updates, please contact [place] Please consult [email or web address] for contact info under    Signed, Leanor Kail, PA  09/21/2021 11:11 AM   Patient examined chart reviewed Discussed care with patient, son Ovid Curd and [place] Exam with obese female basilar crackles no murmur post left BKA with prosthetic Device CXR with vascular congestion less dyspnea with lasix TTE pending history of low normal EF 50-55% When she started Tikosyn had some QT prolongation on 500ug bid and Dr Rayann Heman dropped dose straight to 125 ug bid. Will ask EP to see regarding increasing dose to 250 ug bid QT corrected about 500 msec vs considering ablation Plan [facility] this admission timing dependant on possible dose change to Tikosyn On eliquis chronically with no missed doses and CHADVASC 5.    Jenkins Rouge MD [facility]

## 2021-09-21 NOTE — ED Notes (Signed)
PLACED AT [place] ON

## 2021-09-21 NOTE — Progress Notes (Signed)
Discussed with EP/PA  She was tried on mid dose Tikosyn for 2 doses with long QT They wanted her to have [facility] in am and f/u with Dr Curt Bears As outpatient first available appointment first week in August [facility] to [facility]ou [facility] Orders written  Jenkins Rouge MD [facility]

## 2021-09-21 NOTE — H&P (Signed)
History and Physical    Patient: Julia West WFU:[id number] DOB: 12/21/1955 DOA: 09/20/2021 DOS: the patient was seen and examined on 09/21/2021 PCP: [name], MD  Patient coming from: Home  Chief Complaint: Palpitations, SOB  HPI: Julia West is a 66 y.o. female with medical history significant of CAD s/p CABG 2003, morbid obesity, HTN, A.Fib on tikosyn and eliquis.  Pt first diagnosed with A.Fib late last year in October.  Had cardioversion.  Had recurrence of A.Fib, started on tikosyn and had 2nd cardioversion.  Pt maintained in NSR since then.  Today however presents to ED with c/o SOB and palpitations at around 0400 this AM.  SOB worse with exertion.  O2 sat at home was low, HR HR variable.  In to ED.  In ED: initially A.Fib RVR.  Rate controlled after 1 dose of '10mg'$  cardizem IV.  CTA chest concerning for mild pulm edema.    Review of Systems: As mentioned in the history of present illness. All other systems reviewed and are negative. Past Medical History:  Diagnosis Date   Acute myocardial infarction    with a ruptured plaque in the circumflex in 2003   Anxiety    Cancer ([place])    basal cell face   Complication of anesthesia    states low O2 sats post-op 11/13, always slow to awaken   Degenerative joint disease    Dehiscence of amputation stump (HCC)    left BKA   Depression    Fatty liver    HTN (hypertension)    Hyperlipidemia LDL goal <70    Incarcerated hernia    Internal and external hemorrhoids without complication    Lymphedema of arm    right   Morbid obesity (HCC)    Rheumatoid arthritis ([place])    Surgical wound, non healing ABDOMINAL    has wound vac @ 125 mm Hg   Ventral hernia    Past Surgical History:  Procedure Laterality Date   AMPUTATION Left 05/06/2017   Procedure: BELOW KNEE AMPUTATION;  Surgeon: Newt Minion, MD;  Location: [place];  Service: Orthopedics;  Laterality: Left;   APPLICATION OF WOUND VAC     BOWEL RESECTION   02/07/2012   Procedure: SMALL BOWEL RESECTION;  Surgeon: Gayland Curry, MD,FACS;  Location: [place];  Service: General;;   CARDIOVASCULAR STRESS TEST  01-17-2012  DR HOCHREIN   LOW RISK NUCLEAR TEST   CARDIOVERSION N/A 07/14/2020   Procedure: CARDIOVERSION;  Surgeon: Arnoldo Lenis, MD;  Location: [facility];  Service: Endoscopy;  Laterality: N/A;   CESAREAN SECTION     x 4 in remote past   CHOLECYSTECTOMY     CORONARY ARTERY BYPASS GRAFT  2003   by Dr. Servando Snare. LIMA to the LAD, free RIMA to the circumflex. Stress perfusion study December 2009 with no high-risk areas of ischemia. She has a well-preserved ejection fraction   HYSTEROSCOPY WITH D & C N/A 05/14/2014   Procedure: DILATATION AND CURETTAGE (no specimen); HYSTEROSCOPY;  Surgeon: Jonnie Kind, MD;  Location: [facility];  Service: Gynecology;  Laterality: N/A;   I & D EXTREMITY Left 06/21/2017   Procedure: IRRIGATION AND DEBRIDEMENT OF LEFT LEG AMPUTATION SITE;  Surgeon: Wallace Going, DO;  Location: [facility];  Service: Plastics;  Laterality: Left;   INCISION AND DRAINAGE OF WOUND  04/17/2012   Procedure: IRRIGATION AND DEBRIDEMENT WOUND;  Surgeon: Theodoro Kos, DO;  Location: [place];  Service: Plastics;  Laterality: N/A;  OF ABDOMINAL WOUND, SURGICAL PREP AND PLACEMENT OF VAC, REMOVAL FOREHEAD SKIN LESION   INCISION AND DRAINAGE OF WOUND N/A 04/24/2012   Procedure: IRRIGATION AND DEBRIDEMENT WOUND;  Surgeon: Theodoro Kos, DO;  Location: [place];  Service: Plastics;  Laterality: N/A;  I & D ABDOMINAL WOUND WITH VAC AND ACELL   INCISION AND DRAINAGE OF WOUND N/A 05/01/2012   Procedure: IRRIGATION AND DEBRIDEMENT WOUND;  Surgeon: Theodoro Kos, DO;  Location: [place];  Service: Plastics;  Laterality: N/A;  WITH SURGICAL PREP AND PLACEMENT OF VAC   INCISION AND DRAINAGE OF WOUND N/A 05/08/2012   Procedure: IRRIGATION AND DEBRIDEMENT OF ABD WOUND SURGICAL PREP AND PLACEMENT OF VAC ;   Surgeon: Theodoro Kos, DO;  Location: [place];  Service: Plastics;  Laterality: N/A;  IRRIGATION AND DEBRIDEMENT OF ABD WOUND SURGICAL PREP AND PLACEMENT OF VAC    INCISION AND DRAINAGE OF WOUND N/A 05/15/2012   Procedure: IRRIGATION AND DEBRIDEMENT OF ABDOMINAL WOUND WITH POSSIBLE SURGICAL PREP AND PLACEMENT OF VAC;  Surgeon: Theodoro Kos, DO;  Location: [place];  Service: Plastics;  Laterality: N/A;   INCISION AND DRAINAGE OF WOUND N/A 05/22/2012   Procedure: IRRIGATION AND DEBRIDEMENT OF ABODOMINAL WOUND WITH  SURGICAL PREP AND VAC PLACEMENT;  Surgeon: Theodoro Kos, DO;  Location: [place];  Service: Plastics;  Laterality: N/A;   INCISION AND DRAINAGE OF WOUND N/A 06/28/2012   Procedure: IRRIGATION AND DEBRIDEMENT OF ABDOMINAL ULCER SURGICAL PREP AND PLACEMENT OF ACELL AND VAC;  Surgeon: Theodoro Kos, DO;  Location: [place];  Service: Plastics;  Laterality: N/A;   INCISIONAL HERNIA REPAIR  02/07/2012   Procedure: HERNIA REPAIR INCISIONAL;  Surgeon: Gayland Curry, MD,FACS;  Location: [place];  Service: General;;  Open, Primary repair, strangulated Incisional hernia.   LESION REMOVAL  04/17/2012   Procedure: LESION REMOVAL;  Surgeon: Theodoro Kos, DO;  Location: [place];  Service: Plastics;  Laterality: N/A;   CENTER OF FOREHEAD, REMOVAL FORHEAD SKIN LESION   ORIF TOE FRACTURE Left 02/09/2017   Procedure: EXCISION TALAR HEAD, INTERNAL FIXATION MEDIAL COLUMN LEFT FOOT;  Surgeon: Newt Minion, MD;  Location: [place];  Service: Orthopedics;  Laterality: Left;   POLYPECTOMY N/A 05/14/2014   Procedure: ENDOMETRIAL POLYPECTOMY;  Surgeon: Jonnie Kind, MD;  Location[facility];  Service: Gynecology;  Laterality: N/A;   STUMP REVISION Left 08/31/2017   Procedure: REVISION LEFT BELOW KNEE AMPUTATION;  Surgeon: Newt Minion, MD;  Location: [facility];  Service: Orthopedics;  Laterality: Left;   Social History:  reports that  she quit smoking about 9 years ago. Her smoking use included cigarettes. She has a 25.00 pack-year smoking history. She has been exposed to tobacco smoke. She has never used smokeless tobacco. She reports that she does not drink alcohol and does not use drugs.  Allergies  Allergen Reactions   Ace Inhibitors Cough    Family History  Problem Relation Age of Onset   Heart attack Mother    Hypertension Mother    Heart disease Mother    Sudden death Mother    Obesity Mother    Hypertension Sister    Diabetes Brother    Birth defects Son        heart defect    Prior to Admission medications   Medication Sig Start Date End Date Taking? Authorizing Provider  amLODipine (NORVASC) 2.5 MG tablet Take 1 tablet (2.5 mg total) by  mouth daily. 06/08/21   [name], MD  apixaban (ELIQUIS) 5 MG TABS tablet TAKE 1 TABLET BY MOUTH TWICE  DAILY 08/24/21   [name], MD  atorvastatin (LIPITOR) 20 MG tablet TAKE 1 TABLET BY MOUTH DAILY 09/07/21   [name], MD  BELSOMRA 20 MG TABS Take 1 tablet by mouth at bedtime as needed. 06/01/21   [name], MD  dofetilide (TIKOSYN) 125 MCG capsule Take 1 capsule (125 mcg total) by mouth 2 (two) times daily. 09/10/21   [name], MD  fenofibrate 160 MG tablet TAKE 1 TABLET BY MOUTH DAILY 09/07/21   [name], MD  fexofenadine (ALLEGRA) 180 MG tablet Take 1 tablet (180 mg total) by mouth daily. For allergy symptoms 08/19/20   [name], MD  fluticasone [facility]) 50 MCG/ACT nasal spray Place 1 spray into both nostrils daily as needed for allergies or rhinitis.    [provider]  furosemide (LASIX) 20 MG tablet Take 1 tablet (20 mg total) by mouth daily as needed. 05/13/21   [name], MD  nitroGLYCERIN (NITROSTAT) 0.4 MG SL tablet Place 1 tablet (0.4 mg total) under the tongue every 5 (five) minutes x 3 doses as needed for chest pain. If pain persist after call 911 01/08/16 12/17/20  [name], MD  sertraline (ZOLOFT) 50  MG tablet TAKE 1 TABLET BY MOUTH DAILY 09/07/21   [name], MD  valsartan (DIOVAN) 320 MG tablet TAKE 1 TABLET BY MOUTH DAILY 09/04/21   [name], MD    Physical Exam: Vitals:   09/20/21 2230 09/20/21 2245 09/20/21 2315 09/20/21 2345  BP: (!) 130/105 (!) 143/108 (!) 140/102 (!) 147/92  Pulse: (!) 39 100 (!) 35 (!) 106  Resp: (!) 21 19 (!) 26 (!) 27  Temp:      TempSrc:      SpO2: 92% 92% 93% 91%   Constitutional: NAD, calm, comfortable Eyes: PERRL, lids and conjunctivae normal ENMT: Mucous membranes are moist. Posterior pharynx clear of any exudate or lesions.Normal dentition.  Neck: normal, supple, no masses, no thyromegaly Respiratory: Rales / crackles present Cardiovascular: Irr, irr no murmurs / rubs / gallops. No extremity edema. 2+ pedal pulses. No carotid bruits.  Abdomen: no tenderness, no masses palpated. No hepatosplenomegaly. Bowel sounds positive.  Musculoskeletal: L BKA Skin: no rashes, lesions, ulcers. No induration Neurologic: CN 2-12 grossly intact. Sensation intact, DTR normal. Strength 5/5 in all 4.  Psychiatric: Normal judgment and insight. Alert and oriented x 3. Normal mood.   Data Reviewed:    CBC    Component Value Date/Time   WBC 14.8 (H) 09/20/2021 1355   RBC 5.14 (H) 09/20/2021 1355   HGB 14.1 09/20/2021 1355   HGB 15.7 08/19/2020 0909   HCT 45.7 09/20/2021 1355   HCT 47.5 (H) 08/19/2020 0909   PLT 282 09/20/2021 1355   PLT 287 08/19/2020 0909   MCV 88.9 09/20/2021 1355   MCV 87 08/19/2020 0909   MCH 27.4 09/20/2021 1355   MCHC 30.9 09/20/2021 1355   RDW 15.8 (H) 09/20/2021 1355   RDW 14.2 08/19/2020 0909   LYMPHSABS 5.2 (H) 08/19/2020 0909   MONOABS 1.0 07/11/2020 0930   EOSABS 0.3 08/19/2020 0909   BASOSABS 0.1 08/19/2020 0909   Doesn't appear that CMP was ordered in ED.  CTA chest: 1) no PE 2) mild B ground glass opacities, ? Pulm edema  BNP = 304.3 Trop 11 and 11  Assessment and Plan: * New onset of congestive heart  failure ([place])  New onset CHF in setting of recurrence of A.Fib today. Patient has acute decompensated CHF: Patient presents with: ORTHOPNEA and dyspnea on exertion / increased shortness of breath Exam findings include: PULMONARY CRACKLES and tachycardia Work up findings include: PULMONARY EDEMA ON CXR CHF pathway Tele monitor Got lasix '40mg'$  IV x1 in ED Rate control of A.Fib for the moment with cardizem '10mg'$  IV x1 done in ED 2d echo ordered Strict intake and output BMP daily Holding off on further lasix for the moment, will defer to cards Message put in to P. Trent for cards eval in AM (Patient is an A.Fib patient of Dr. Rosezella [place]), to see how they want to proceed (diurese, rate control, or just cardiovert her back out of a.fib again?)  Persistent atrial fibrillation ([place]) Normally maintained in NSR with tikosyn. Recurrent A.Fib today causing palpitations. Suspect A.Fib recurrence also causing new onset CHF. Cont eliquis Cont tikosyn Got one dose of cardizem in ED Use PRN metoprolol, or cardizem if needed for rate control.  Coronary artery disease involving native coronary artery of native heart without angina pectoris Trop neg today x2 Cont statin  Class 3 severe obesity with serious comorbidity and body mass index (BMI) of 50.0 to 59.9 in adult [facility]) Chronic. Consider starting Wegovy as outpt if affordable.  Essential hypertension Continue diovan Hold norvasc for the moment (may want to use cardizem or BB PO instead if we need further rate control med for AF).      Advance Care Planning:   Code Status: Full Code  Consults: Message sent to P. Trent for AM cards eval  Family Communication: No family in room  Severity of Illness: The appropriate patient status for this patient is OBSERVATION. Observation status is judged to be reasonable and necessary in order to provide the required intensity of service to ensure the patient's safety. The patient's presenting symptoms,  physical exam findings, and initial radiographic and laboratory data in the context of their medical condition is felt to place them at decreased risk for further clinical deterioration. Furthermore, it is anticipated that the patient will be medically stable for discharge from the hospital within 2 midnights of admission.   Author: Etta Quill., DO 09/21/2021 12:03 AM  For on call review [email or web address].

## 2021-09-21 NOTE — Progress Notes (Signed)
Heart Failure Stewardship Pharmacist Progress Note   PCP: [name], MD PCP-Cardiologist: [name], MD    HPI:  66 yo F with PMH of CAD s/p CABG 2003, obesity, left BKA, HTN, HLD, and afib.   She presented to the ED on 7/9 with shortness of breath and edema. Found to be in afib RVR. CXR with pulmonary vascular congestion. CTA negative for PE showing infiltrate vs edema. ECHO 7/10 with new reduction in LVEF 30-35% (was 50-55% 06/2020; 60-65% 06/2017), mild LVH, RV ok.   Current HF Medications: ACE/ARB/ARNI: irbesartan 300 mg daily  Prior to admission HF Medications: Diuretic: furosemide 20 mg daily PRN ACE/ARB/ARNI: valsartan 320 mg daily  Pertinent Lab Values: Serum creatinine 1.03, BUN 17, Potassium 3.6, Sodium 140, BNP 304.3, Magnesium 2.0, A1c 5.7   Vital Signs: Weight: 305 lbs (admission weight: 305 lbs) Blood pressure: 130/70s  Heart rate: 90-110s - afib  I/O: -1.3L yesterday; net -1.7L  Medication Assistance / Insurance Benefits Check: Does the patient have prescription insurance?  Yes Type of insurance plan: UHC Medicare  Does the patient qualify for medication assistance through manufacturers or grants?   Pending Eligible grants and/or patient assistance programs: pending Medication assistance applications in progress: none  Medication assistance applications approved: none Approved medication assistance renewals will be completed by: pending  Outpatient Pharmacy:  Prior to admission [facility] Is the patient willing to use [place] at discharge? Yes Is the patient willing to transition their outpatient pharmacy to utilize a [facility] outpatient pharmacy?   Pending    Assessment: 1. Acute on chronic systolic CHF (LVEF 83-38%), due to presumed NICM with afib RVR. NYHA class III symptoms. - Last dose of IV lasix given 7/9 - consider redosing today with edema on exam. Has chronic lymphedema.  - Noted she has bradycardia when in sinus  rhythm - caution adding BB - Consider transitioning ARB to [facility] with new worsened LVEF - Consider starting spironolactone 25 mg daily and SGLT2i prior to discharge   Plan: 1) Medication changes recommended at this time: - Re-dose IV lasix if indicated - Start spironolactone 25 mg daily  2) Patient assistance: - In donut hole - copays more expensive during this time - Entresto copay once out of donut hole - $47 - Jardiance/Farxiga copay once out of donut hole - $47  3)  Education  - To be completed prior to discharge  Kerby Nora, PharmD, BCPS Heart Failure Cytogeneticist Phone [phone]

## 2021-09-21 NOTE — Progress Notes (Signed)
Heart Failure Navigator Progress Note  Following this hospitalization to assess for HV TOC readiness.   EF 30-35 % ( new drop)  Planned TEE/DCCV 09/22/21  Earnestine Leys, BSN, RN Heart Failure Transport planner Only

## 2021-09-21 NOTE — Progress Notes (Signed)
PROGRESS NOTE    Julia West  WJX:[id number] DOB: August 19, 1955 DOA: 09/20/2021 PCP: [name], MD  Outpatient Specialists: cardiology    Brief Narrative:   From admission h and p Julia West is a 66 y.o. female with medical history significant of CAD s/p CABG 2003, morbid obesity, HTN, A.Fib on tikosyn and eliquis.   Pt first diagnosed with A.Fib late last year in October.  Had cardioversion.  Had recurrence of A.Fib, started on tikosyn and had 2nd cardioversion.  Pt maintained in NSR since then.   Today however presents to ED with c/o SOB and palpitations at around 0400 this AM.  SOB worse with exertion.  O2 sat at home was low, HR HR variable.  In to ED.   Assessment & Plan:   Principal Problem:   New onset of congestive heart failure (HCC) Active Problems:   Persistent atrial fibrillation (HCC)   Essential hypertension   Class 3 severe obesity with serious comorbidity and body mass index (BMI) of 50.0 to 59.9 in adult [facility])   Coronary artery disease involving native coronary artery of native heart without angina pectoris   # A-fib with RVR Hx cardioversion, then more recently transitioned to [place]. Yesterday with dyspnea, found to be in a fib with rvr. Mild volume overload. HR normalized with IV diltiazem - s/p lasix 40 IV, defer further diuresis to cardiology - cont home tikosyn - cardiology consulted - cont home apixaban  # CHF, decompensated 2/2 a-fib. CXR with signs volume overload. Received lasix as above - tte ordered, result pending - managing a-fib as above  # Acute hypoxic respiratory failure 2/2 a-fib, volume overload - cont [place] O2, - treat underlying causes as above  # CAD Hx cabg 2003. No chest pain and troponins neg - cont home statin, apixaban  # GAD - home sertraline  # HTN  Here normotensive - cont home irbesartan - amlodipine on hold  # Lung nodule Right lower lobe - outpt ct chest 6-12 mo  # Left AKA - uses  prothesis to ambulate   DVT prophylaxis: apixaban Code Status: full Family Communication: none @ bedside  Level of care: Telemetry Cardiac Status is: Observation The patient will require care spanning > 2 midnights and should be moved to inpatient because: hypoxic, requiring [place] O2    Consultants:  cardiology  Procedures: none  Antimicrobials:  none    Subjective: Feeling fine in bed  Objective: Vitals:   09/21/21 0700 09/21/21 0746 09/21/21 0804 09/21/21 0900  BP: (!) 130/97   115/75  Pulse: 98   95  Resp: 18   (!) 21  Temp:  97.6 F (36.4 C)    TempSrc:  Oral    SpO2: 93%   94%  Weight:   (!) 138.3 kg   Height:   '5\' 6"'$  (1.676 m)     Intake/Output Summary (Last 24 hours) at 09/21/2021 1110 Last data filed at 09/20/2021 2300 Gross per 24 hour  Intake --  Output 1300 ml  Net -1300 ml   Filed Weights   09/21/21 0804  Weight: (!) 138.3 kg    Examination:  General exam: Appears calm and comfortable  Respiratory system: Clear to auscultation. Respiratory effort normal. Cardiovascular system: S1 & S2 heard, tachycardic, irreg irreg. No JVD, murmurs, rubs, gallops or clicks. No pedal edema. Gastrointestinal system: Abdomen is obese, soft and nontender. No organomegaly or masses felt. Normal bowel sounds heard. Central nervous system: Alert and oriented. No focal neurological deficits. Extremities:  Symmetric 5 x 5 power. Skin: No rashes, lesions or ulcers Psychiatry: Judgement and insight appear normal. Mood & affect appropriate.     Data Reviewed: I have personally reviewed following labs and imaging studies  CBC: Recent Labs  Lab 09/20/21 1355  WBC 14.8*  HGB 14.1  HCT 45.7  MCV 88.9  PLT 786   Basic Metabolic Panel: Recent Labs  Lab 09/20/21 1355 09/21/21 0042  NA  --  140  K  --  3.6  CL  --  101  CO2  --  23  GLUCOSE  --  151*  BUN  --  17  CREATININE  --  1.03*  CALCIUM  --  8.9  MG 2.0  --    GFR: Estimated Creatinine Clearance:  75 mL/min (A) (by C-G formula based on SCr of 1.03 mg/dL (H)). Liver Function Tests: Recent Labs  Lab 09/21/21 0042  AST 20  ALT 16  ALKPHOS 41  BILITOT 0.8  PROT 6.7  ALBUMIN 3.5   No results for input(s): "LIPASE", "AMYLASE" in the last 168 hours. No results for input(s): "AMMONIA" in the last 168 hours. Coagulation Profile: No results for input(s): "INR", "PROTIME" in the last 168 hours. Cardiac Enzymes: No results for input(s): "CKTOTAL", "CKMB", "CKMBINDEX", "TROPONINI" in the last 168 hours. BNP (last 3 results) No results for input(s): "PROBNP" in the last 8760 hours. HbA1C: No results for input(s): "HGBA1C" in the last 72 hours. CBG: No results for input(s): "GLUCAP" in the last 168 hours. Lipid Profile: No results for input(s): "CHOL", "HDL", "LDLCALC", "TRIG", "CHOLHDL", "LDLDIRECT" in the last 72 hours. Thyroid Function Tests: Recent Labs    09/20/21 1355  TSH 0.983   Anemia Panel: No results for input(s): "VITAMINB12", "FOLATE", "FERRITIN", "TIBC", "IRON", "RETICCTPCT" in the last 72 hours. Urine analysis:    Component Value Date/Time   COLORURINE STRAW (A) 09/20/2021 2259   APPEARANCEUR CLEAR 09/20/2021 2259   APPEARANCEUR Clear 12/10/2016 1203   LABSPEC 1.005 09/20/2021 2259   PHURINE 6.0 09/20/2021 2259   GLUCOSEU NEGATIVE 09/20/2021 2259   HGBUR NEGATIVE 09/20/2021 2259   BILIRUBINUR NEGATIVE 09/20/2021 2259   BILIRUBINUR Negative 12/10/2016 [place] 09/20/2021 2259   PROTEINUR NEGATIVE 09/20/2021 2259   UROBILINOGEN 0.2 05/10/2014 1039   NITRITE NEGATIVE 09/20/2021 2259   LEUKOCYTESUR NEGATIVE 09/20/2021 2259   Sepsis Labs: '@LABRCNTIP'$ (procalcitonin:4,lacticidven:4)  ) Recent Results (from the past 240 hour(s))  SARS Coronavirus 2 by RT PCR (hospital order, performed in [place] hospital lab) *cepheid single result test* Anterior Nasal Swab     Status: None   Collection Time: 09/21/21 12:10 AM   Specimen: Anterior Nasal  Swab  Result Value Ref Range Status   SARS Coronavirus 2 by RT PCR NEGATIVE NEGATIVE Final    Comment: (NOTE) SARS-CoV-2 target nucleic acids are NOT DETECTED.  The SARS-CoV-2 RNA is generally detectable in upper and lower respiratory specimens during the acute phase of infection. The lowest concentration of SARS-CoV-2 viral copies this assay can detect is 250 copies / mL. A negative result does not preclude SARS-CoV-2 infection and should not be used as the sole basis for treatment or other patient management decisions.  A negative result may occur with improper specimen collection / handling, submission of specimen other than nasopharyngeal swab, presence of viral mutation(s) within the areas targeted by this assay, and inadequate number of viral copies (<250 copies / mL). A negative result must be combined with clinical observations, patient history, and epidemiological information.  Fact Sheet for Patients:   https://www.patel.info/  Fact Sheet for Healthcare Providers: https://hall.com/  This test is not yet approved or  cleared by the [place] FDA and has been authorized for detection and/or diagnosis of SARS-CoV-2 by FDA under an Emergency Use Authorization (EUA).  This EUA will remain in effect (meaning this test can be used) for the duration of the COVID-19 declaration under Section 564(b)(1) of the Act, 21 U.S.C. section 360bbb-3(b)(1), unless the authorization is terminated or revoked sooner.  Performed at [facility], [place] [address], [place], [place] [address]          Radiology Studies: CT Angio Chest PE W and/or Wo Contrast  Result Date: 09/20/2021 CLINICAL DATA:  Pulmonary embolism suspected, unknown D-dimer. EXAM: CT ANGIOGRAPHY CHEST WITH CONTRAST TECHNIQUE: Multidetector CT imaging of the chest was performed using the standard protocol during bolus administration of intravenous contrast. Multiplanar CT  image reconstructions and MIPs were obtained to evaluate the vascular anatomy. RADIATION DOSE REDUCTION: This exam was performed according to the departmental dose-optimization program which includes automated exposure control, adjustment of the mA and/or kV according to patient size and/or use of iterative reconstruction technique. CONTRAST:  139m OMNIPAQUE IOHEXOL 350 MG/ML SOLN COMPARISON:  None Available. FINDINGS: Cardiovascular: The heart is enlarged and there is no pericardial effusion. Three-vessel coronary artery calcification is noted. There is atherosclerotic calcification of the aorta without evidence of aneurysm. The pulmonary trunk is normal in caliber. No pulmonary artery filling defect is identified. Mediastinum/Nodes: Prominent lymph nodes are noted in the periaortic space adjacent to the mid to distal descending aorta on the left measuring up to 1 cm. Nonspecific prominent lymph nodes are present in the hilar regions bilaterally. No axillary lymphadenopathy. Scattered coarse calcifications are noted in the thyroid gland. The trachea and esophagus are within normal limits. Lungs/Pleura: Hazy ground-glass attenuation is noted in the lungs bilaterally. Mild atelectasis is noted at the lung bases. No effusion or pneumothorax. There is a nodule in the right lower lobe measuring 7 mm, axial image 81. Upper Abdomen: There is reflux of contrast into the inferior vena cava and hepatic veins which may be associated with right heart failure. The gallbladder is surgically absent. A cyst is noted in the right upper quadrant measuring 8.9 cm, likely renal in origin. Musculoskeletal: Sternotomy wires are noted over the midline. Degenerative changes are present in the thoracic spine. No acute or suspicious osseous abnormality. Review of the MIP images confirms the above findings. IMPRESSION: 1. No evidence of pulmonary embolism. 2. Hazy ground-glass attenuation in the lungs bilaterally, possible edema or  infiltrate. 3. 7 mm right lower lobe pulmonary nodule. Non-contrast chest CT at 6-12 months is recommended. If the nodule is stable at time of repeat CT, then future CT at 18-24 months (from today's scan) is considered optional for low-risk patients, but is recommended for high-risk patients. This recommendation follows the consensus statement: Guidelines for Management of Incidental Pulmonary Nodules Detected on CT Images: From the Fleischner Society 2017; Radiology 2017; 284:228-243. 4. Cardiomegaly with coronary artery calcifications. 5. Aortic atherosclerosis. Electronically Signed   By: LBrett FairyM.D.   On: 09/20/2021 23:15   DG Chest 2 View  Result Date: 09/20/2021 CLINICAL DATA:  Shortness of breath.  Chest tightness. EXAM: CHEST - 2 VIEW COMPARISON:  None Available. FINDINGS: The cardio pericardial silhouette is enlarged. There is pulmonary vascular congestion without overt pulmonary edema. Interstitial markings are diffusely coarsened with chronic features. Streaky opacity at the bases suggest  atelectasis or scarring. No focal consolidation or overt pleural effusion. IMPRESSION: Enlargement of the cardiopericardial silhouette with pulmonary vascular congestion. Electronically Signed   By: Misty Stanley M.D.   On: 09/20/2021 14:30        Scheduled Meds:  apixaban  5 mg Oral BID   atorvastatin  20 mg Oral Daily   dofetilide  125 mcg Oral BID   fenofibrate  160 mg Oral Daily   irbesartan  300 mg Oral Daily   sertraline  50 mg Oral Daily   sodium chloride flush  3 mL Intravenous Q12H   Continuous Infusions:  sodium chloride       LOS: 0 days     Desma Maxim, MD [facility]   If 7PM-7AM, please contact night-coverage [email or web address] Password [email or web address] 09/21/2021, 11:10 AM

## 2021-09-21 NOTE — Progress Notes (Signed)
   09/21/21 2000  Assess: MEWS Score  Temp 98.2 F (36.8 C)  BP 117/73  MAP (mmHg) 88  Pulse Rate (!) 128  ECG Heart Rate (!) 126  Resp 19  Level of Consciousness Alert  SpO2 93 %  Assess: MEWS Score  MEWS Temp 0  MEWS Systolic 0  MEWS Pulse 2  MEWS RR 0  MEWS LOC 0  MEWS Score 2  MEWS Score Color Yellow  Assess: if the MEWS score is Yellow or Red  Were vital signs taken at a resting state? Yes  Focused Assessment No change from prior assessment  Does the patient meet 2 or more of the SIRS criteria? No  MEWS guidelines implemented *See Row Information* Yes  Treat  MEWS Interventions Escalated (See documentation below)  Pain Scale 0-10  Pain Score 0  Take Vital Signs  Increase Vital Sign Frequency  Yellow: Q 2hr X 2 then Q 4hr X 2, if remains yellow, continue Q 4hrs  Escalate  MEWS: Escalate Yellow: discuss with charge nurse/RN and consider discussing with provider and RRT  Notify: Charge Nurse/RN  Name of Charge Nurse/RN Notified Tanya,RN  Date Charge Nurse/RN Notified 09/21/21  Time Charge Nurse/RN Notified 2000  Document  Patient Outcome Other (Comment) (stable and no change)  Progress note created (see row info) Yes  Assess: SIRS CRITERIA  SIRS Temperature  0  SIRS Pulse 1  SIRS Respirations  0  SIRS WBC 0  SIRS Score Sum  1

## 2021-09-21 NOTE — ED Notes (Signed)
Pt ambulated to restroom. Pt self applied prosthetic leg. Pt utilized walker, at home she has crutches.

## 2021-09-21 NOTE — ED Notes (Signed)
Pt to ECHO

## 2021-09-22 ENCOUNTER — Inpatient Hospital Stay (HOSPITAL_COMMUNITY): Payer: Medicare Other | Admitting: Anesthesiology

## 2021-09-22 ENCOUNTER — Encounter (HOSPITAL_COMMUNITY)
Admission: EM | Disposition: A | Discharge status: Home / Self Care | Payer: Self-pay | Source: Home / Self Care | Attending: Internal Medicine

## 2021-09-22 ENCOUNTER — Encounter: Payer: Self-pay | Admitting: Cardiology

## 2021-09-22 ENCOUNTER — Encounter (HOSPITAL_COMMUNITY): Payer: Self-pay | Admitting: Obstetrics and Gynecology

## 2021-09-22 DIAGNOSIS — I4891 Unspecified atrial fibrillation: Secondary | ICD-10-CM

## 2021-09-22 DIAGNOSIS — Z87891 Personal history of nicotine dependence: Secondary | ICD-10-CM | POA: Diagnosis not present

## 2021-09-22 DIAGNOSIS — I10 Essential (primary) hypertension: Secondary | ICD-10-CM | POA: Diagnosis not present

## 2021-09-22 DIAGNOSIS — I2510 Atherosclerotic heart disease of native coronary artery without angina pectoris: Secondary | ICD-10-CM

## 2021-09-22 DIAGNOSIS — I509 Heart failure, unspecified: Secondary | ICD-10-CM | POA: Diagnosis not present

## 2021-09-22 HISTORY — PX: CARDIOVERSION: SHX1299

## 2021-09-22 LAB — BASIC METABOLIC PANEL
Anion gap: 11 (ref 5–15)
BUN: 25 mg/dL — ABNORMAL HIGH (ref 8–23)
CO2: 23 mmol/L (ref 22–32)
Calcium: 8.6 mg/dL — ABNORMAL LOW (ref 8.9–10.3)
Chloride: 106 mmol/L (ref 98–111)
Creatinine, Ser: 1.21 mg/dL — ABNORMAL HIGH (ref 0.44–1.00)
GFR, Estimated: 49 mL/min — ABNORMAL LOW (ref >60)
Glucose, Bld: 115 mg/dL — ABNORMAL HIGH (ref 70–99)
Potassium: 4.3 mmol/L (ref 3.5–5.1)
Sodium: 140 mmol/L (ref 135–145)

## 2021-09-22 LAB — PROTIME-INR
INR: 1.3 — ABNORMAL HIGH (ref 0.8–1.2)
Prothrombin Time: 15.8 seconds — ABNORMAL HIGH (ref 11.4–15.2)

## 2021-09-22 SURGERY — CARDIOVERSION
Anesthesia: General

## 2021-09-22 MED ORDER — PROPOFOL 10 MG/ML IV BOLUS
INTRAVENOUS | Status: DC | PRN
  Administered 2021-09-22: 80 mg via INTRAVENOUS
  Administered 2021-09-22: 20 mg via INTRAVENOUS

## 2021-09-22 MED ORDER — LIDOCAINE 2% (20 MG/ML) 5 ML SYRINGE
INTRAMUSCULAR | Status: DC | PRN
  Administered 2021-09-22: 100 mg via INTRAVENOUS

## 2021-09-22 MED ORDER — FUROSEMIDE 40 MG PO TABS
40.0000 mg | ORAL_TABLET | Freq: Every day | ORAL | Status: DC
  Administered 2021-09-22: 40 mg via ORAL
  Filled 2021-09-22: qty 1

## 2021-09-22 NOTE — Transfer of Care (Signed)
Immediate Anesthesia Transfer of Care Note  Patient: Julia West  Procedure(s) Performed: CARDIOVERSION  Patient Location: Short Stay  Anesthesia Type:MAC  Level of Consciousness: drowsy  Airway & Oxygen Therapy: Patient Spontanous Breathing and Patient connected to nasal cannula oxygen  Post-op Assessment: Report given to RN and Post -op Vital signs reviewed and stable  Post vital signs: Reviewed and stable  Last Vitals:  Vitals Value Taken Time  BP 107/47   Temp    Pulse 59   Resp 12   SpO2 92     Last Pain:  Vitals:   09/22/21 1304  TempSrc: Oral  PainSc:          Complications: No notable events documented.

## 2021-09-22 NOTE — Plan of Care (Signed)
  Problem: Clinical Measurements: Goal: Respiratory complications will improve Outcome: Progressing   Problem: Nutrition: Goal: Adequate nutrition will be maintained Outcome: Progressing   Problem: Coping: Goal: Level of anxiety will decrease Outcome: Progressing   Problem: Pain Managment: Goal: General experience of comfort will improve Outcome: Progressing   Problem: Activity: Goal: Risk for activity intolerance will decrease Outcome: Not Progressing

## 2021-09-22 NOTE — CV Procedure (Signed)
    Electrical Cardioversion Procedure Note Julia West [id number] Sep 15, 1955  Procedure: Electrical Cardioversion Indications:  Atrial Fibrillation  Time Out: Verified patient identification, verified procedure,medications/allergies/relevent history reviewed, required imaging and test results available.  Performed  Procedure Details  The patient was NPO after midnight. Anesthesia was administered at the beside  by Dr.Rose with propofol.  Cardioversion was performed with synchronized biphasic defibrillation via AP pads with 200 joules.  2 attempt(s) were performed (second with pressure on the chest).  The patient converted to normal sinus rhythm. The patient tolerated the procedure well   IMPRESSION:  Successful cardioversion of atrial fibrillation    Julia West 09/22/2021, 2:22 PM

## 2021-09-22 NOTE — Interval H&P Note (Signed)
History and Physical Interval Note:  09/22/2021 1:30 PM  Julia West  has presented today for surgery, with the diagnosis of afib.  The various methods of treatment have been discussed with the patient and family. After consideration of risks, benefits and other options for treatment, the patient has consented to  Procedure(s): CARDIOVERSION (N/A) as a surgical intervention.  The patient's history has been reviewed, patient examined, no change in status, stable for surgery.  I have reviewed the patient's chart and labs.  Questions were answered to the patient's satisfaction.     [employer]

## 2021-09-22 NOTE — Progress Notes (Signed)
[facility]  PROGRESS NOTE  Julia West ACZ:[id number] DOB: 09-25-1955 DOA: 09/20/2021 PCP: [name], MD   Brief HPI:   66 year old female with history of CAD s/p CABG 2003,  morbid obesity, hypertension, atrial fibrillation on Tikosyn and Eliquis who was first diagnosed with atrial fibrillation last year in October.  Had cardioversion.  Patient had recurrence of atrial fibrillation, started on Tikosyn and had second cardioversion.  Patient has maintained normal sinus rhythm since then. Patient presented to the ED with complaints of shortness of breath and palpitations    Subjective   Denies shortness of breath or chest pain.  Plan for cardioversion today   Assessment/Plan:    Atrial fibrillation with RVR -Patient had cardioversion in the past, recently transitioned to Tikosyn -She was started on IV diltiazem -She is on anticoagulation with apixaban -Plan for DC cardioversion today -Cardiology following  Acute on chronic systolic CHF -EF 10-93%, received 1 dose of Lasix 40 mg IV yesterday -We will start Lasix 40 mg p.o. daily  Acute kidney injury -Likely from diuresis as above -Started on p.o. Lasix -Follow BMP in am  Acute hypoxemic respiratory failure -Secondary to CHF exacerbation -Currently on oxygen 3 L/min via nasal cannula -Continue to wean off oxygen as tolerated  CAD -S/p CABG in 2003 -Continue statin, apixaban  Hypertension -Continue irbesartan -Amlodipine on hold -Blood pressure is stable  Lung nodule -Seen on CTA chest in  right lower lobe -Need outpatient CT chest in 6 to 12 months    Medications     apixaban  5 mg Oral BID   atorvastatin  20 mg Oral Daily   Chlorhexidine Gluconate Cloth  6 each Topical Q0600   dofetilide  125 mcg Oral BID   fenofibrate  160 mg Oral Daily   irbesartan  300 mg Oral Daily   mupirocin ointment  1 Application Nasal BID   potassium chloride  40 mEq Oral Once   sertraline  50 mg Oral Daily    sodium chloride flush  3 mL Intravenous Q12H     Data Reviewed:   CBG:  No results for input(s): "GLUCAP" in the last 168 hours.  SpO2: 90 % O2 Flow Rate (L/min): 3 L/min    Vitals:   09/22/21 1304 09/22/21 1405 09/22/21 1407 09/22/21 1421  BP: (!) 133/102 (!) 93/48 (!) 92/47 (!) 114/41  Pulse: 93 (!) 51 (!) 53 (!) 55  Resp: (!) '21 19 19 18  '$ Temp: 98.1 F (36.7 C)  97.9 F (36.6 C)   TempSrc: Oral  Oral   SpO2: 92% (!) 88% (!) 88% 90%  Weight: (!) 138.5 kg     Height: '5\' 6"'$  (1.676 m)         Data Reviewed:  Basic Metabolic Panel: Recent Labs  Lab 09/20/21 1355 09/21/21 0042 09/21/21 1217 09/22/21 0051  NA  --  140  --  140  K  --  3.6  --  4.3  CL  --  101  --  106  CO2  --  23  --  23  GLUCOSE  --  151*  --  115*  BUN  --  17  --  25*  CREATININE  --  1.03*  --  1.21*  CALCIUM  --  8.9  --  8.6*  MG 2.0  --  2.0  --     CBC: Recent Labs  Lab 09/20/21 1355  WBC 14.8*  HGB 14.1  HCT 45.7  MCV 88.9  PLT 282    LFT Recent Labs  Lab 09/21/21 0042  AST 20  ALT 16  ALKPHOS 41  BILITOT 0.8  PROT 6.7  ALBUMIN 3.5     Antibiotics: Anti-infectives (From admission, onward)    None        DVT prophylaxis: Apixaban  Code Status: Full code  Family Communication: No family at bedside   CONSULTS    Objective    Physical Examination:  General-appears in no acute distress Heart-S1-S2, irregular, no murmur auscultated Lungs-clear to auscultation bilaterally, no wheezing or crackles auscultated Abdomen-soft, nontender, no organomegaly Extremities-no edema in the lower extremities Neuro-alert, oriented x3, no focal deficit noted    Status is: Inpatient: Atrial fibrillation           [place]   [facility] If 7PM-7AM, please contact night-coverage at [email or web address], Office  [phone]   09/22/2021, 3:38 PM  LOS: 1 day

## 2021-09-22 NOTE — Progress Notes (Signed)
Primary Cardiologist:  Hochrein  Subjective:  Denies SSCP, palpitations or Dyspnea Afib better controlled   Objective:  Vitals:   09/22/21 0017 09/22/21 0342 09/22/21 0350 09/22/21 0813  BP: 121/80 (!) 141/69  (!) 143/80  Pulse: (!) 104 93  92  Resp: '20 17  18  '$ Temp: 98.6 F (37 C) 97.6 F (36.4 C)  98.7 F (37.1 C)  TempSrc: Oral Oral  Oral  SpO2: 94% 95%  92%  Weight:   (!) 138.5 kg   Height:        Intake/Output from previous day:  Intake/Output Summary (Last 24 hours) at 09/22/2021 0824 Last data filed at 09/22/2021 0602 Gross per 24 hour  Intake 248.32 ml  Output 1000 ml  Net -751.68 ml    Physical Exam: Obese female Basilar crackles Distant heart sounds  Abdomen benign Trace edema  Lab Results: Basic Metabolic Panel: Recent Labs    09/20/21 1355 09/21/21 0042 09/21/21 1217 09/22/21 0051  NA  --  140  --  140  K  --  3.6  --  4.3  CL  --  101  --  106  CO2  --  23  --  23  GLUCOSE  --  151*  --  115*  BUN  --  17  --  25*  CREATININE  --  1.03*  --  1.21*  CALCIUM  --  8.9  --  8.6*  MG 2.0  --  2.0  --    Liver Function Tests: Recent Labs    09/21/21 0042  AST 20  ALT 16  ALKPHOS 41  BILITOT 0.8  PROT 6.7  ALBUMIN 3.5   No results for input(s): "LIPASE", "AMYLASE" in the last 72 hours. CBC: Recent Labs    09/20/21 1355  WBC 14.8*  HGB 14.1  HCT 45.7  MCV 88.9  PLT 282    Thyroid Function Tests: Recent Labs    09/21/21 1217  TSH 0.951   Anemia Panel: No results for input(s): "VITAMINB12", "FOLATE", "FERRITIN", "TIBC", "IRON", "RETICCTPCT" in the last 72 hours.  Imaging: ECHOCARDIOGRAM COMPLETE  Result Date: 09/21/2021    ECHOCARDIOGRAM REPORT   Patient Name:   Julia West Date of Exam: 09/21/2021 Medical Rec #:  [id number]           Height:       66.0 in Accession #:    [id number]          Weight:       305.0 lb Date of Birth:  October 24, 1955            BSA:          2.393 m Patient Age:    66 years            BP:            155/75 mmHg Patient Gender: F                   HR:           98 bpm. Exam Location:  Inpatient Procedure: 2D Echo, Cardiac Doppler and Color Doppler Indications:    Atrial fibrillation  History:        Patient has prior history of Echocardiogram examinations, most                 recent 06/18/2020. CHF, CAD, Arrythmias:Bradycardia,                 Signs/Symptoms:Chest Pain;  Risk Factors:Hypertension, Diabetes                 and Dyslipidemia.  Sonographer:    Ronny Flurry Sonographer#2:  Melissa Morford RDCS (AE, PE) Referring Phys: [place]  1. Technically difficult study, consider limited echo with contrast to better evaluation wall motion. Left ventricular ejection fraction, by estimation, is 30 to 35%. The left ventricle has moderately decreased function. Left ventricular endocardial border not optimally defined to evaluate regional wall motion. There is mild left ventricular hypertrophy. Left ventricular diastolic parameters are indeterminate.  2. Right ventricular systolic function is normal. The right ventricular size is mildly enlarged. Tricuspid regurgitation signal is inadequate for assessing PA pressure.  3. Right atrial size was mildly dilated.  4. The mitral valve is normal in structure. No evidence of mitral valve regurgitation.  5. The aortic valve was not well visualized. Aortic valve regurgitation is trivial. No aortic stenosis is present.  6. The inferior vena cava is dilated in size with >50% respiratory variability, suggesting right atrial pressure of 8 mmHg. FINDINGS  Left Ventricle: Left ventricular ejection fraction, by estimation, is 30 to 35%. The left ventricle has moderately decreased function. Left ventricular endocardial border not optimally defined to evaluate regional wall motion. The left ventricular internal cavity size was normal in size. There is mild left ventricular hypertrophy. Left ventricular diastolic parameters are indeterminate. Right  Ventricle: The right ventricular size is mildly enlarged. No increase in right ventricular wall thickness. Right ventricular systolic function is normal. Tricuspid regurgitation signal is inadequate for assessing PA pressure. Left Atrium: Left atrial size was normal in size. Right Atrium: Right atrial size was mildly dilated. Pericardium: There is no evidence of pericardial effusion. Mitral Valve: The mitral valve is normal in structure. No evidence of mitral valve regurgitation. Tricuspid Valve: The tricuspid valve is normal in structure. Tricuspid valve regurgitation is trivial. Aortic Valve: The aortic valve was not well visualized. Aortic valve regurgitation is trivial. No aortic stenosis is present. Pulmonic Valve: The pulmonic valve was not well visualized. Pulmonic valve regurgitation is not visualized. Aorta: The aortic root and ascending aorta are structurally normal, with no evidence of dilitation. Venous: The inferior vena cava is dilated in size with greater than 50% respiratory variability, suggesting right atrial pressure of 8 mmHg. IAS/Shunts: The interatrial septum was not well visualized.  LEFT VENTRICLE PLAX 2D LVIDd:         5.10 cm LVIDs:         3.95 cm LV PW:         1.10 cm LV IVS:        1.15 cm LVOT diam:     2.20 cm LVOT Area:     3.80 cm  LEFT ATRIUM             Index        RIGHT ATRIUM           Index LA diam:        4.95 cm 2.07 cm/m   RA Area:     23.70 cm LA Vol (A2C):   60.3 ml 25.20 ml/m  RA Volume:   70.50 ml  29.47 ml/m LA Vol (A4C):   93.3 ml 38.99 ml/m LA Biplane Vol: 74.8 ml 31.26 ml/m   AORTA Ao Root diam: 2.80 cm Ao Asc diam:  3.50 cm  SHUNTS Systemic Diam: 2.20 cm Oswaldo Milian MD Electronically signed by Oswaldo Milian MD Signature Date/Time: 09/21/2021/11:30:45 AM  Final    CT Angio Chest PE W and/or Wo Contrast  Result Date: 09/20/2021 CLINICAL DATA:  Pulmonary embolism suspected, unknown D-dimer. EXAM: CT ANGIOGRAPHY CHEST WITH CONTRAST TECHNIQUE:  Multidetector CT imaging of the chest was performed using the standard protocol during bolus administration of intravenous contrast. Multiplanar CT image reconstructions and MIPs were obtained to evaluate the vascular anatomy. RADIATION DOSE REDUCTION: This exam was performed according to the departmental dose-optimization program which includes automated exposure control, adjustment of the mA and/or kV according to patient size and/or use of iterative reconstruction technique. CONTRAST:  143m OMNIPAQUE IOHEXOL 350 MG/ML SOLN COMPARISON:  None Available. FINDINGS: Cardiovascular: The heart is enlarged and there is no pericardial effusion. Three-vessel coronary artery calcification is noted. There is atherosclerotic calcification of the aorta without evidence of aneurysm. The pulmonary trunk is normal in caliber. No pulmonary artery filling defect is identified. Mediastinum/Nodes: Prominent lymph nodes are noted in the periaortic space adjacent to the mid to distal descending aorta on the left measuring up to 1 cm. Nonspecific prominent lymph nodes are present in the hilar regions bilaterally. No axillary lymphadenopathy. Scattered coarse calcifications are noted in the thyroid gland. The trachea and esophagus are within normal limits. Lungs/Pleura: Hazy ground-glass attenuation is noted in the lungs bilaterally. Mild atelectasis is noted at the lung bases. No effusion or pneumothorax. There is a nodule in the right lower lobe measuring 7 mm, axial image 81. Upper Abdomen: There is reflux of contrast into the inferior vena cava and hepatic veins which may be associated with right heart failure. The gallbladder is surgically absent. A cyst is noted in the right upper quadrant measuring 8.9 cm, likely renal in origin. Musculoskeletal: Sternotomy wires are noted over the midline. Degenerative changes are present in the thoracic spine. No acute or suspicious osseous abnormality. Review of the MIP images confirms the  above findings. IMPRESSION: 1. No evidence of pulmonary embolism. 2. Hazy ground-glass attenuation in the lungs bilaterally, possible edema or infiltrate. 3. 7 mm right lower lobe pulmonary nodule. Non-contrast chest CT at 6-12 months is recommended. If the nodule is stable at time of repeat CT, then future CT at 18-24 months (from today's scan) is considered optional for low-risk patients, but is recommended for high-risk patients. This recommendation follows the consensus statement: Guidelines for Management of Incidental Pulmonary Nodules Detected on CT Images: From the Fleischner Society 2017; Radiology 2017; 284:228-243. 4. Cardiomegaly with coronary artery calcifications. 5. Aortic atherosclerosis. Electronically Signed   By: LBrett FairyM.D.   On: 09/20/2021 23:15   DG Chest 2 View  Result Date: 09/20/2021 CLINICAL DATA:  Shortness of breath.  Chest tightness. EXAM: CHEST - 2 VIEW COMPARISON:  None Available. FINDINGS: The cardio pericardial silhouette is enlarged. There is pulmonary vascular congestion without overt pulmonary edema. Interstitial markings are diffusely coarsened with chronic features. Streaky opacity at the bases suggest atelectasis or scarring. No focal consolidation or overt pleural effusion. IMPRESSION: Enlargement of the cardiopericardial silhouette with pulmonary vascular congestion. Electronically Signed   By: EMisty StanleyM.D.   On: 09/20/2021 14:30    Cardiac Studies:  ECG: afib nonspecific ST changes PVC;s    Telemetry: afib rates 90's PVC;s   Echo: EF 30-35% LA moderately dilated 49 mm  Medications:    apixaban  5 mg Oral BID   atorvastatin  20 mg Oral Daily   Chlorhexidine Gluconate Cloth  6 each Topical Q0600   dofetilide  125 mcg Oral BID   fenofibrate  160  mg Oral Daily   irbesartan  300 mg Oral Daily   mupirocin ointment  1 Application Nasal BID   potassium chloride  40 mEq Oral Once   sertraline  50 mg Oral Daily   sodium chloride flush  3 mL  Intravenous Q12H      sodium chloride     sodium chloride 20 mL/hr at 09/22/21 0347    Assessment/Plan:   PAF:  On low dose Tikosyn with long QT at higher doses [facility] May and ERAF ? Rate related drop in EF Presented with rapid afib and CHF Improved with lasix No missed doses of eliquis For [facility] today at 2:30 with me. Risks including intubation , stroke and PPM discussed willing to proceed Has outpatient f/u [facility] 8/7 to discuss alternative AAT or ablation She tends to bradycardia in sinus Depending on rate post conversion consider adding coreg for afib and low EF Continue lasix and ARB K 4.3 Cr 1.2 Mg normal   Jenkins Rouge 09/22/2021, 8:24 AM

## 2021-09-22 NOTE — Anesthesia Procedure Notes (Signed)
Procedure Name: General with mask airway Date/Time: 09/22/2021 1:53 PM  Performed by: Erick Colace, CRNAPre-anesthesia Checklist: Patient identified, Emergency Drugs available, Suction available and Patient being monitored Patient Re-evaluated:Patient Re-evaluated prior to induction Oxygen Delivery Method: Ambu bag Preoxygenation: Pre-oxygenation with 100% oxygen Induction Type: IV induction Dental Injury: Teeth and Oropharynx as per pre-operative assessment

## 2021-09-22 NOTE — Progress Notes (Signed)
Heart Failure Stewardship Pharmacist Progress Note   PCP: [name], MD PCP-Cardiologist: [name], MD    HPI:  66 yo F with PMH of CAD s/p CABG 2003, obesity, left BKA, HTN, HLD, and afib.   She presented to the ED on 7/9 with shortness of breath and edema. Found to be in afib RVR. CXR with pulmonary vascular congestion. CTA negative for PE showing infiltrate vs edema. ECHO 7/10 with new reduction in LVEF 30-35% (was 50-55% 06/2020; 60-65% 06/2017), mild LVH, RV ok.   Pending DCCV today.  Current HF Medications: ACE/ARB/ARNI: irbesartan 300 mg daily  Prior to admission HF Medications: Diuretic: furosemide 20 mg daily PRN ACE/ARB/ARNI: valsartan 320 mg daily  Pertinent Lab Values: Serum creatinine 1.21, BUN 25, Potassium 4.3, Sodium 140, BNP 304.3, Magnesium 2.0, A1c 5.7   Vital Signs: Weight: 305 lbs (admission weight: 305 lbs) Blood pressure: labile - 90/60-140/80s  Heart rate: 90-110s - afib  I/O: -449m yesterday; net -2.1L  Medication Assistance / Insurance Benefits Check: Does the patient have prescription insurance?  Yes Type of insurance plan: UHC Medicare  Does the patient qualify for medication assistance through manufacturers or grants?   Pending Eligible grants and/or patient assistance programs: pending Medication assistance applications in progress: none  Medication assistance applications approved: none Approved medication assistance renewals will be completed by: pending  Outpatient Pharmacy:  Prior to admission [facility] Is the patient willing to use MS[facility]isat discharge? Yes Is the patient [facility]   Pending    Assessment: 1. Acute on chronic systolic CHF (LVEF 399-35%, due to presumed NICM with afib RVR. NYHA class III symptoms. - No further IV lasix doses ordered. Has chronic lymphedema. Keep K>4 and Mag>2. - Noted she has  bradycardia when in sinus rhythm - caution adding BB after DCCV. - [facility] hold today with labile BP. - Consider starting spironolactone 25 mg daily and SGLT2i prior to discharge   Plan: 1) Medication changes recommended at this time: - None pending DCCV - If BP stabilized after DCCV, add spironolactone 25 mg daily  2) Patient assistance: - In donut hole - copays more expensive during this time - Entresto copay once out of donut hole - $47 - Jardiance/Farxiga copay once out of donut hole - $47  3)  Education  - To be completed prior to discharge  MKerby Nora PharmD, BCPS Heart Failure Stewardship Pharmacist Phone ([phone]

## 2021-09-22 NOTE — Anesthesia Preprocedure Evaluation (Signed)
Anesthesia Evaluation  Patient identified by MRN, date of birth, ID band Patient awake    Reviewed: Allergy & Precautions, NPO status , Patient's Chart, lab work & pertinent test results  Airway Mallampati: II  TM Distance: <3 FB Neck ROM: Full    Dental no notable dental hx.    Pulmonary neg pulmonary ROS, former smoker,    Pulmonary exam normal breath sounds clear to auscultation       Cardiovascular hypertension, + CAD, + Past MI, + CABG and + Peripheral Vascular Disease  Normal cardiovascular exam Rhythm:Regular Rate:Normal     Neuro/Psych negative neurological ROS  negative psych ROS   GI/Hepatic negative GI ROS, Neg liver ROS,   Endo/Other  Morbid obesity  Renal/GU negative Renal ROS  negative genitourinary   Musculoskeletal  (+) Arthritis , Rheumatoid disorders,    Abdominal   Peds negative pediatric ROS (+)  Hematology negative hematology ROS (+)   Anesthesia Other Findings   Reproductive/Obstetrics negative OB ROS                             Anesthesia Physical Anesthesia Plan  ASA: 3  Anesthesia Plan: General   Post-op Pain Management: Minimal or no pain anticipated   Induction: Intravenous  PONV Risk Score and Plan: 3 and Treatment may vary due to age or medical condition  Airway Management Planned: Mask  Additional Equipment:   Intra-op Plan:   Post-operative Plan: Extubation in OR  Informed Consent: I have reviewed the patients History and Physical, chart, labs and discussed the procedure including the risks, benefits and alternatives for the proposed anesthesia with the patient or authorized representative who has indicated his/her understanding and acceptance.     Dental advisory given  Plan Discussed with: CRNA and Surgeon  Anesthesia Plan Comments:         Anesthesia Quick Evaluation

## 2021-09-22 NOTE — H&P (View-Only) (Signed)
Primary Cardiologist:  Hochrein  Subjective:  Denies SSCP, palpitations or Dyspnea Afib better controlled   Objective:  Vitals:   09/22/21 0017 09/22/21 0342 09/22/21 0350 09/22/21 0813  BP: 121/80 (!) 141/69  (!) 143/80  Pulse: (!) 104 93  92  Resp: '20 17  18  '$ Temp: 98.6 F (37 C) 97.6 F (36.4 C)  98.7 F (37.1 C)  TempSrc: Oral Oral  Oral  SpO2: 94% 95%  92%  Weight:   (!) 138.5 kg   Height:        Intake/Output from previous day:  Intake/Output Summary (Last 24 hours) at 09/22/2021 0824 Last data filed at 09/22/2021 0602 Gross per 24 hour  Intake 248.32 ml  Output 1000 ml  Net -751.68 ml    Physical Exam: Obese female Basilar crackles Distant heart sounds  Abdomen benign Trace edema  Lab Results: Basic Metabolic Panel: Recent Labs    09/20/21 1355 09/21/21 0042 09/21/21 1217 09/22/21 0051  NA  --  140  --  140  K  --  3.6  --  4.3  CL  --  101  --  106  CO2  --  23  --  23  GLUCOSE  --  151*  --  115*  BUN  --  17  --  25*  CREATININE  --  1.03*  --  1.21*  CALCIUM  --  8.9  --  8.6*  MG 2.0  --  2.0  --    Liver Function Tests: Recent Labs    09/21/21 0042  AST 20  ALT 16  ALKPHOS 41  BILITOT 0.8  PROT 6.7  ALBUMIN 3.5   No results for input(s): "LIPASE", "AMYLASE" in the last 72 hours. CBC: Recent Labs    09/20/21 1355  WBC 14.8*  HGB 14.1  HCT 45.7  MCV 88.9  PLT 282    Thyroid Function Tests: Recent Labs    09/21/21 1217  TSH 0.951   Anemia Panel: No results for input(s): "VITAMINB12", "FOLATE", "FERRITIN", "TIBC", "IRON", "RETICCTPCT" in the last 72 hours.  Imaging: ECHOCARDIOGRAM COMPLETE  Result Date: 09/21/2021    ECHOCARDIOGRAM REPORT   Patient Name:   Julia West Date of Exam: 09/21/2021 Medical Rec #:  [id number]           Height:       66.0 in Accession #:    [id number]          Weight:       305.0 lb Date of Birth:  October 05, 1955            BSA:          2.393 m Patient Age:    66 years            BP:            155/75 mmHg Patient Gender: F                   HR:           98 bpm. Exam Location:  Inpatient Procedure: 2D Echo, Cardiac Doppler and Color Doppler Indications:    Atrial fibrillation  History:        Patient has prior history of Echocardiogram examinations, most                 recent 06/18/2020. CHF, CAD, Arrythmias:Bradycardia,                 Signs/Symptoms:Chest Pain;  Risk Factors:Hypertension, Diabetes                 and Dyslipidemia.  Sonographer:    Ronny Flurry Sonographer#2:  Melissa Morford RDCS (AE, PE) Referring Phys: [place]  1. Technically difficult study, consider limited echo with contrast to better evaluation wall motion. Left ventricular ejection fraction, by estimation, is 30 to 35%. The left ventricle has moderately decreased function. Left ventricular endocardial border not optimally defined to evaluate regional wall motion. There is mild left ventricular hypertrophy. Left ventricular diastolic parameters are indeterminate.  2. Right ventricular systolic function is normal. The right ventricular size is mildly enlarged. Tricuspid regurgitation signal is inadequate for assessing PA pressure.  3. Right atrial size was mildly dilated.  4. The mitral valve is normal in structure. No evidence of mitral valve regurgitation.  5. The aortic valve was not well visualized. Aortic valve regurgitation is trivial. No aortic stenosis is present.  6. The inferior vena cava is dilated in size with >50% respiratory variability, suggesting right atrial pressure of 8 mmHg. FINDINGS  Left Ventricle: Left ventricular ejection fraction, by estimation, is 30 to 35%. The left ventricle has moderately decreased function. Left ventricular endocardial border not optimally defined to evaluate regional wall motion. The left ventricular internal cavity size was normal in size. There is mild left ventricular hypertrophy. Left ventricular diastolic parameters are indeterminate. Right  Ventricle: The right ventricular size is mildly enlarged. No increase in right ventricular wall thickness. Right ventricular systolic function is normal. Tricuspid regurgitation signal is inadequate for assessing PA pressure. Left Atrium: Left atrial size was normal in size. Right Atrium: Right atrial size was mildly dilated. Pericardium: There is no evidence of pericardial effusion. Mitral Valve: The mitral valve is normal in structure. No evidence of mitral valve regurgitation. Tricuspid Valve: The tricuspid valve is normal in structure. Tricuspid valve regurgitation is trivial. Aortic Valve: The aortic valve was not well visualized. Aortic valve regurgitation is trivial. No aortic stenosis is present. Pulmonic Valve: The pulmonic valve was not well visualized. Pulmonic valve regurgitation is not visualized. Aorta: The aortic root and ascending aorta are structurally normal, with no evidence of dilitation. Venous: The inferior vena cava is dilated in size with greater than 50% respiratory variability, suggesting right atrial pressure of 8 mmHg. IAS/Shunts: The interatrial septum was not well visualized.  LEFT VENTRICLE PLAX 2D LVIDd:         5.10 cm LVIDs:         3.95 cm LV PW:         1.10 cm LV IVS:        1.15 cm LVOT diam:     2.20 cm LVOT Area:     3.80 cm  LEFT ATRIUM             Index        RIGHT ATRIUM           Index LA diam:        4.95 cm 2.07 cm/m   RA Area:     23.70 cm LA Vol (A2C):   60.3 ml 25.20 ml/m  RA Volume:   70.50 ml  29.47 ml/m LA Vol (A4C):   93.3 ml 38.99 ml/m LA Biplane Vol: 74.8 ml 31.26 ml/m   AORTA Ao Root diam: 2.80 cm Ao Asc diam:  3.50 cm  SHUNTS Systemic Diam: 2.20 cm Oswaldo Milian MD Electronically signed by Oswaldo Milian MD Signature Date/Time: 09/21/2021/11:30:45 AM  Final    CT Angio Chest PE W and/or Wo Contrast  Result Date: 09/20/2021 CLINICAL DATA:  Pulmonary embolism suspected, unknown D-dimer. EXAM: CT ANGIOGRAPHY CHEST WITH CONTRAST TECHNIQUE:  Multidetector CT imaging of the chest was performed using the standard protocol during bolus administration of intravenous contrast. Multiplanar CT image reconstructions and MIPs were obtained to evaluate the vascular anatomy. RADIATION DOSE REDUCTION: This exam was performed according to the departmental dose-optimization program which includes automated exposure control, adjustment of the mA and/or kV according to patient size and/or use of iterative reconstruction technique. CONTRAST:  186m OMNIPAQUE IOHEXOL 350 MG/ML SOLN COMPARISON:  None Available. FINDINGS: Cardiovascular: The heart is enlarged and there is no pericardial effusion. Three-vessel coronary artery calcification is noted. There is atherosclerotic calcification of the aorta without evidence of aneurysm. The pulmonary trunk is normal in caliber. No pulmonary artery filling defect is identified. Mediastinum/Nodes: Prominent lymph nodes are noted in the periaortic space adjacent to the mid to distal descending aorta on the left measuring up to 1 cm. Nonspecific prominent lymph nodes are present in the hilar regions bilaterally. No axillary lymphadenopathy. Scattered coarse calcifications are noted in the thyroid gland. The trachea and esophagus are within normal limits. Lungs/Pleura: Hazy ground-glass attenuation is noted in the lungs bilaterally. Mild atelectasis is noted at the lung bases. No effusion or pneumothorax. There is a nodule in the right lower lobe measuring 7 mm, axial image 81. Upper Abdomen: There is reflux of contrast into the inferior vena cava and hepatic veins which may be associated with right heart failure. The gallbladder is surgically absent. A cyst is noted in the right upper quadrant measuring 8.9 cm, likely renal in origin. Musculoskeletal: Sternotomy wires are noted over the midline. Degenerative changes are present in the thoracic spine. No acute or suspicious osseous abnormality. Review of the MIP images confirms the  above findings. IMPRESSION: 1. No evidence of pulmonary embolism. 2. Hazy ground-glass attenuation in the lungs bilaterally, possible edema or infiltrate. 3. 7 mm right lower lobe pulmonary nodule. Non-contrast chest CT at 6-12 months is recommended. If the nodule is stable at time of repeat CT, then future CT at 18-24 months (from today's scan) is considered optional for low-risk patients, but is recommended for high-risk patients. This recommendation follows the consensus statement: Guidelines for Management of Incidental Pulmonary Nodules Detected on CT Images: From the Fleischner Society 2017; Radiology 2017; 284:228-243. 4. Cardiomegaly with coronary artery calcifications. 5. Aortic atherosclerosis. Electronically Signed   By: LBrett FairyM.D.   On: 09/20/2021 23:15   DG Chest 2 View  Result Date: 09/20/2021 CLINICAL DATA:  Shortness of breath.  Chest tightness. EXAM: CHEST - 2 VIEW COMPARISON:  None Available. FINDINGS: The cardio pericardial silhouette is enlarged. There is pulmonary vascular congestion without overt pulmonary edema. Interstitial markings are diffusely coarsened with chronic features. Streaky opacity at the bases suggest atelectasis or scarring. No focal consolidation or overt pleural effusion. IMPRESSION: Enlargement of the cardiopericardial silhouette with pulmonary vascular congestion. Electronically Signed   By: EMisty StanleyM.D.   On: 09/20/2021 14:30    Cardiac Studies:  ECG: afib nonspecific ST changes PVC;s    Telemetry: afib rates 90's PVC;s   Echo: EF 30-35% LA moderately dilated 49 mm  Medications:    apixaban  5 mg Oral BID   atorvastatin  20 mg Oral Daily   Chlorhexidine Gluconate Cloth  6 each Topical Q0600   dofetilide  125 mcg Oral BID   fenofibrate  160  mg Oral Daily   irbesartan  300 mg Oral Daily   mupirocin ointment  1 Application Nasal BID   potassium chloride  40 mEq Oral Once   sertraline  50 mg Oral Daily   sodium chloride flush  3 mL  Intravenous Q12H      sodium chloride     sodium chloride 20 mL/hr at 09/22/21 0347    Assessment/Plan:   PAF:  On low dose Tikosyn with long QT at higher doses [facility] May and ERAF ? Rate related drop in EF Presented with rapid afib and CHF Improved with lasix No missed doses of eliquis For [facility] today at 2:30 with me. Risks including intubation , stroke and PPM discussed willing to proceed Has outpatient f/u [place] 8/7 to discuss alternative AAT or ablation She tends to bradycardia in sinus Depending on rate post conversion consider adding coreg for afib and low EF Continue lasix and ARB K 4.3 Cr 1.2 Mg normal   Jenkins Rouge 09/22/2021, 8:24 AM

## 2021-09-22 NOTE — Progress Notes (Signed)
Heart Failure Nurse Navigator Progress Note  PCP: [name], MD PCP-Cardiologist: Hochrein Admission Diagnosis: Atrial fibrillation, Dyspnea Admitted from: Home  Presentation:   Julia West presented with shortness of breath and edema. Found in Afib/ RVR. 120's, given a dose of cardizem, CTA showed negative PE, patient given IV lasix, BNP 300. BP 147/92, HR 106, planned TEE/DCCV 09/22/21 with Dr. Marlou Porch.  Patient and daughter Julia West were educated on the sign and symptoms of heart failure. Daily weights, when to call her doctor or go to the ER. Diet/ fluid restrictions, taking all medications as prescribed and attending all medical appointments. Multiple questions were asked and patient verbalized her understanding of education. Patient is scheduled for a hospital HF TOC appointment on 10/05/21 @ 10 am.   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   Depression    Fatty liver    HTN (hypertension)    Hyperlipidemia LDL goal <70    Incarcerated hernia    Internal and external hemorrhoids without complication    Lymphedema of arm    right   Morbid obesity (HCC)    Rheumatoid arthritis ([place])    Surgical wound, non healing ABDOMINAL    has wound vac @ 125 mm Hg   Ventral hernia      Social History   Socioeconomic History   Marital status: Married    Spouse name: Julia West   Number of children: 5   Years of education: Not on file   Highest education level: GED or equivalent  Occupational History   Occupation: Estate manager/land agent    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 5 children. One of whom has pulmonary stenosis.    Left BKA, uses crutches - doesn't get much exercise   Social Determinants of Health   Financial Resource Strain: Low Risk  (09/22/2021)   Overall Financial Resource Strain (CARDIA)    Difficulty of Paying Living Expenses: Not very hard  Food Insecurity: No Food Insecurity (09/22/2021)   Hunger Vital Sign    Worried About Running Out of Food in the Last Year: Never true    Ran Out of Food in the Last Year: Never true  Transportation Needs: No Transportation Needs (09/22/2021)   PRAPARE - Hydrologist (Medical): No    Lack of Transportation (Non-Medical): No  Physical Activity: Insufficiently Active (10/01/2020)   Exercise Vital Sign  Days of Exercise per Week: 7 days    Minutes of Exercise per Session: 10 min  Stress: No Stress Concern Present (10/01/2020)   [place]    Feeling of Stress : Not at all  Social Connections: Moderately Isolated (10/01/2020)   Social Connection and Isolation Panel [NHANES]    Frequency of Communication with Friends and Family: More than three times a week    Frequency of Social Gatherings with Friends and Family: Twice a week    Attends Religious Services: Never    Marine scientist or Organizations: No    Attends Music therapist: Never    Marital Status: Married   Teacher, early years/pre and Provision:  Detailed education and instructions provided on heart failure  disease management including the following:  Signs and symptoms of Heart Failure When to call the physician Importance of daily weights Low sodium diet Fluid restriction Medication management Anticipated future follow-up appointments  Patient education given on each of the above topics.  Patient acknowledges understanding via teach back method and acceptance of all instructions.  Education Materials:  "Living Better With Heart Failure" Booklet, HF zone tool, & Daily Weight Tracker Tool.  Patient has scale at home: No, will bring patient one. Patient has pill box at home: NA    High Risk Criteria for Readmission and/or Poor Patient Outcomes: Heart failure hospital admissions (last 6 months): 0  No Show rate: 0 Difficult social situation: No Demonstrates medication adherence: Yes Primary Language: English Literacy level: Reading, writing, and comprehension.  Barriers of Care:   Diet/ fluid restrictions Daily weights  Considerations/Referrals:   Referral made to Heart Failure Pharmacist Stewardship: Yes  Referral made to Heart Failure CSW/NCM TOC: No Referral made to Heart & Vascular TOC clinic: Yes, 10/05/21 @ 10 am  Items for Follow-up on DC/TOC: Diet/ fluid restrictions Daily weights   Earnestine Leys, BSN, RN Heart Failure Transport planner Only

## 2021-09-23 ENCOUNTER — Encounter (HOSPITAL_COMMUNITY): Payer: Self-pay | Admitting: Cardiology

## 2021-09-23 DIAGNOSIS — I5023 Acute on chronic systolic (congestive) heart failure: Secondary | ICD-10-CM | POA: Diagnosis not present

## 2021-09-23 DIAGNOSIS — I10 Essential (primary) hypertension: Secondary | ICD-10-CM | POA: Diagnosis not present

## 2021-09-23 DIAGNOSIS — I509 Heart failure, unspecified: Secondary | ICD-10-CM | POA: Diagnosis not present

## 2021-09-23 DIAGNOSIS — I4891 Unspecified atrial fibrillation: Secondary | ICD-10-CM | POA: Diagnosis not present

## 2021-09-23 DIAGNOSIS — D72829 Elevated white blood cell count, unspecified: Secondary | ICD-10-CM | POA: Diagnosis present

## 2021-09-23 DIAGNOSIS — I2510 Atherosclerotic heart disease of native coronary artery without angina pectoris: Secondary | ICD-10-CM | POA: Diagnosis not present

## 2021-09-23 LAB — CBC WITH DIFFERENTIAL/PLATELET
Abs Immature Granulocytes: 0 10*3/uL (ref 0.00–0.07)
Basophils Absolute: 0.2 10*3/uL — ABNORMAL HIGH (ref 0.0–0.1)
Basophils Relative: 1 %
Eosinophils Absolute: 1.2 10*3/uL — ABNORMAL HIGH (ref 0.0–0.5)
Eosinophils Relative: 7 %
HCT: 43.4 % (ref 36.0–46.0)
Hemoglobin: 13.7 g/dL (ref 12.0–15.0)
Lymphocytes Relative: 72 %
Lymphs Abs: 12.8 10*3/uL — ABNORMAL HIGH (ref 0.7–4.0)
MCH: 27.7 pg (ref 26.0–34.0)
MCHC: 31.6 g/dL (ref 30.0–36.0)
MCV: 87.9 fL (ref 80.0–100.0)
Monocytes Absolute: 1.4 10*3/uL — ABNORMAL HIGH (ref 0.1–1.0)
Monocytes Relative: 8 %
Neutro Abs: 2.1 10*3/uL (ref 1.7–7.7)
Neutrophils Relative %: 12 %
Platelets: 311 10*3/uL (ref 150–400)
RBC: 4.94 MIL/uL (ref 3.87–5.11)
RDW: 15.8 % — ABNORMAL HIGH (ref 11.5–15.5)
WBC: 17.8 10*3/uL — ABNORMAL HIGH (ref 4.0–10.5)
nRBC: 0 % (ref 0.0–0.2)
nRBC: 0 /100 WBC

## 2021-09-23 LAB — CBC
HCT: 41.3 % (ref 36.0–46.0)
Hemoglobin: 13.1 g/dL (ref 12.0–15.0)
MCH: 28.2 pg (ref 26.0–34.0)
MCHC: 31.7 g/dL (ref 30.0–36.0)
MCV: 89 fL (ref 80.0–100.0)
Platelets: 243 10*3/uL (ref 150–400)
RBC: 4.64 MIL/uL (ref 3.87–5.11)
RDW: 15.9 % — ABNORMAL HIGH (ref 11.5–15.5)
WBC: 15.5 10*3/uL — ABNORMAL HIGH (ref 4.0–10.5)
nRBC: 0 % (ref 0.0–0.2)

## 2021-09-23 LAB — BASIC METABOLIC PANEL
Anion gap: 12 (ref 5–15)
BUN: 26 mg/dL — ABNORMAL HIGH (ref 8–23)
CO2: 25 mmol/L (ref 22–32)
Calcium: 8.4 mg/dL — ABNORMAL LOW (ref 8.9–10.3)
Chloride: 102 mmol/L (ref 98–111)
Creatinine, Ser: 1.05 mg/dL — ABNORMAL HIGH (ref 0.44–1.00)
GFR, Estimated: 58 mL/min — ABNORMAL LOW (ref >60)
Glucose, Bld: 100 mg/dL — ABNORMAL HIGH (ref 70–99)
Potassium: 4.3 mmol/L (ref 3.5–5.1)
Sodium: 139 mmol/L (ref 135–145)

## 2021-09-23 LAB — URINE CULTURE: Culture: >=100000 — AB

## 2021-09-23 MED ORDER — FUROSEMIDE 20 MG PO TABS: 20.0000 mg | ORAL_TABLET | Freq: Every day | ORAL | Status: DC

## 2021-09-23 MED ORDER — DAPAGLIFLOZIN PROPANEDIOL 10 MG PO TABS
10.0000 mg | ORAL_TABLET | Freq: Every day | ORAL | Status: DC
  Administered 2021-09-23 – 2021-09-25 (×3): 10 mg via ORAL
  Filled 2021-09-23 (×3): qty 1

## 2021-09-23 MED ORDER — CARVEDILOL 3.125 MG PO TABS
3.1250 mg | ORAL_TABLET | Freq: Two times a day (BID) | ORAL | Status: DC
  Administered 2021-09-23 – 2021-09-25 (×5): 3.125 mg via ORAL
  Filled 2021-09-23 (×5): qty 1

## 2021-09-23 MED ORDER — FUROSEMIDE 10 MG/ML IJ SOLN
40.0000 mg | Freq: Once | INTRAMUSCULAR | Status: AC
  Administered 2021-09-23: 40 mg via INTRAVENOUS
  Filled 2021-09-23: qty 4

## 2021-09-23 MED ORDER — POTASSIUM CHLORIDE CRYS ER 20 MEQ PO TBCR
20.0000 meq | EXTENDED_RELEASE_TABLET | Freq: Every day | ORAL | Status: DC
  Administered 2021-09-23 – 2021-09-24 (×2): 20 meq via ORAL
  Filled 2021-09-23 (×2): qty 1

## 2021-09-23 NOTE — Anesthesia Postprocedure Evaluation (Signed)
Anesthesia Post Note  Patient: Julia West  Procedure(s) Performed: CARDIOVERSION     Patient location during evaluation: PACU Anesthesia Type: General Level of consciousness: awake and alert Pain management: pain level controlled Vital Signs Assessment: post-procedure vital signs reviewed and stable Respiratory status: spontaneous breathing, nonlabored ventilation, respiratory function stable and patient connected to nasal cannula oxygen Cardiovascular status: blood pressure returned to baseline and stable Postop Assessment: no apparent nausea or vomiting Anesthetic complications: no   No notable events documented.  Last Vitals:  Vitals:   09/23/21 0946 09/23/21 1100  BP: (!) 127/57 128/60  Pulse: 66 73  Resp:  18  Temp:  36.9 C  SpO2:  90%    Last Pain:  Vitals:   09/23/21 1100  TempSrc: Oral  PainSc:                  Henchy Mccauley S

## 2021-09-23 NOTE — Progress Notes (Signed)
Progress Note   Patient: Julia West PVV:[id number] DOB: [birth year] DOA: 09/20/2021     2 DOS: the patient was seen and examined on 09/23/2021   Brief hospital course: Mrs. Brugger was admitted to the hospital with the working diagnosis of atrial fibrillation with rapid ventricular response.   66 yo female with the past medical history of coronary artery disease sp CABG 2003, hypertension, atrial fibrillation and obesity class 3 who presented with dyspnea and palpitations. Diagnosed with atrial fibrillation in 12/2020, sp cardioversion x2 and further rhythm control with Tikosyn. On the day of admission around 4:00 am reported sudden palpitations and dyspnea. On her initial physical examination she was tachycardic in atrial fibrillation rhythm and received 10 mg IV diltiazem. Blood pressure 130/105, HR 106, RR 21 and 02 saturation 92%. Lungs with bilateral rales, heart with S1 and S2 present, irregularly irregular, abdomen not distended and no lower extremity edema. Left BKA.   NA 140, K 3,6 CL 101, bicarbonate 23 glucose 151 bun 17 cr 1,0  BNP 304  High sensitive troponin 11 11  Wbc 14,8 hgb 14.1 plt 282  Urine analysis with SG 1,005   Chest radiograph with cardiomegaly, bilateral hilar vascular congestion CT chest negative for PE, bilateral ground glass opacities bilaterally. 7 mm nodule right lower lobe.   EKG 121 bpm, left axis deviation, qtc 474, atrial fibrillation rhythm with PVC, no significant ST segment or T wave changes.   Patient was placed on continuous infusion of diltiazem for rate control and underwent direct current cardioversion.  Anticoagulation with apixaban.  Received furosemide for volume overload.    Assessment and Plan: * Acute on chronic systolic CHF (congestive heart failure) (HCC) Echocardiogram with reduced LV systolic function with EF 30 to 35%. Mild LVH. Preserved RV systolic function with mild enlarge cavity.   Urine output 1100 ml over last 24 hrs.   Systolic blood pressure 754 to 138 mmHg.   Plan to continue diuresis with furosemide, today with rales on lung examination and 02 saturation in the low 90 on supplemental 02. Continue with carvedilol, irbesartan and will add SGLT2inh.   Acute hypoxemic respiratory failure due to acute cardiogenic pulmonary edema.   02 saturation is 93% on 2 L/min per [place].  Continue diuresis and wean to room air as tolerated.    Atrial fibrillation with rapid ventricular response ([place]) Patient has converted to sinus rhythm. EKG with junctional PVC and PAC.   Plan to continue rhythm control with dofetilide and added carvedilol. Continue telemetry monitoring for 24 hrs more.  Out of bed to chair tid with meals, PT and OT evaluation. Anticoagulation with apixaban.     Essential hypertension Continue blood pressure control with irbersartan and added carvedilol. Continue diuresis with furosemide.   Dyslipidemia, continue with statin therapy.   Coronary artery disease involving native coronary artery of native heart without angina pectoris Trop neg today x2 Cont statin  Class 3 severe obesity with serious comorbidity and body mass index (BMI) of 50.0 to 59.9 in adult (HCC) Calculated BMI is 49.2    Leukocytosis No signs of infection, continue to hold on antibiotic therapy Follow up on wbc today.        Subjective: Patient is feeling well, no chest pain or palpitations, no dyspnea, she a has not been ambulating since cardioversion   Physical Exam: Vitals:   09/22/21 1802 09/22/21 2000 09/22/21 2338 09/23/21 0309  BP: (!) 123/51 (!) 114/55 (!) 111/54 (!) 133/55  Pulse: 66 73 67 66  Resp: '18 15 18 20  '$ Temp: 98.1 F (36.7 C) 97.9 F (36.6 C) 98.8 F (37.1 C) 98.3 F (36.8 C)  TempSrc: Oral Oral Oral Oral  SpO2: 92% 92% 94% 93%  Weight:      Height:       Neurology awake and alert ENT with no pallor Cardiovascular with S1 and S2 present and rhythmic with no gallops or murmurs No  JVD No lower extremity edema, left BKA Respiratory with rales at bases bilaterally with no wheezing Abdomen not distended  Data Reviewed:    Family Communication: I spoke with patient's daughter at the bedside, we talked in detail about patient's condition, plan of care and prognosis and all questions were addressed.   Disposition: Status is: Inpatient Remains inpatient appropriate because: respiratory failure   Planned Discharge Destination: Home   Author: Tawni Millers, MD 09/23/2021 9:21 AM  For on call review [email or web address].

## 2021-09-23 NOTE — Assessment & Plan Note (Addendum)
Echocardiogram with reduced LV systolic function with EF 30 to 35%. Mild LVH. Preserved RV systolic function with mild enlarge cavity.   Patient was placed on IV furosemide for diuresis, negative fluid balance was achieved -5,673 ml, with improvement of her symptoms.   Possible reduction in LV systolic function due to atrial fibrillation.   Medical therapy with carvedilol, dapagliflozin, valsartan and spironolactone Continue diuresis with furosemide.   Acute hypoxemic respiratory failure due to acute cardiogenic pulmonary edema.   Clinically resolved.

## 2021-09-23 NOTE — TOC Progression Note (Addendum)
Transition of Care [facility]) - Progression Note    Patient Details  Name: Julia West MRN: [id number] Date of Birth: 13-Nov-1955  Transition of Care [facility]) CM/SW Contact  Beckie Busing, RN Phone Number:[phone]  09/23/2021, 1:37 PM  Clinical Narrative:    TOC acknowledges Heart failure Home health screen consult. Note to follow.   Heart failure screening, education and follow up has previously been documented per [facility] failure navigator.         Expected Discharge Plan and Services                                                 Social Determinants of Health (SDOH) Interventions Food Insecurity Interventions: Intervention Not Indicated Financial Strain Interventions: Intervention Not Indicated Housing Interventions: Intervention Not Indicated Transportation Interventions: Intervention Not Indicated  Readmission Risk Interventions     No data to display

## 2021-09-23 NOTE — Progress Notes (Signed)
Heart Failure Stewardship Pharmacist Progress Note   PCP: [name], MD PCP-Cardiologist: [name], MD    HPI:  66 yo F with PMH of CAD s/p CABG 2003, obesity, left BKA, HTN, HLD, and afib.   She presented to the ED on 7/9 with shortness of breath and edema. Found to be in afib RVR. CXR with pulmonary vascular congestion. CTA negative for PE showing infiltrate vs edema. ECHO 7/10 with new reduction in LVEF 30-35% (was 50-55% 06/2020; 60-65% 06/2017), mild LVH, RV ok.   S/p successful DCCV 7/11.  Current HF Medications: Beta Blocker: carvedilol 3.125 mg BID ACE/ARB/ARNI: irbesartan 300 mg daily SGLT2i: Farxiga 10 mg daily   Prior to admission HF Medications: Diuretic: furosemide 20 mg daily PRN ACE/ARB/ARNI: valsartan 320 mg daily  Pertinent Lab Values: Serum creatinine 1.05, BUN 26, Potassium 4.3, Sodium 139, BNP 304.3, Magnesium 2.0, A1c 5.7   Vital Signs: Weight: 305 lbs (admission weight: 305 lbs) Blood pressure: 130/50s Heart rate: 60-70s NSR I/O: -1.3L yesterday; net -3L  Medication Assistance / Insurance Benefits Check: Does the patient have prescription insurance?  Yes Type of insurance plan: UHC Medicare  Does the patient qualify for medication assistance through manufacturers or grants?   Yes Eligible grants and/or patient assistance programs: Lisabeth Register Medication assistance applications in progress: [place]  Medication assistance applications approved: none Approved medication assistance renewals will be completed by: pending  Outpatient Pharmacy:  Prior to admission [facility] Is the patient willing to use [facility] at discharge? Yes Is the patient willing to transition their outpatient pharmacy to utilize a [facility] outpatient pharmacy?   Pending    Assessment: 1. 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 NYHA class III symptoms. - No further IV lasix doses ordered. Has chronic  lymphedema. Keep K>4 and Mag>2. - Noted she has bradycardia when in sinus rhythm - continue low-dose carvedilol carefully - Consider transitioning ARB to [facility] with new worsened LVEF - Consider starting spironolactone 25 mg daily at follow up - Agree with starting Farxiga 10 mg daily   Plan: 1) Medication changes recommended at this time: - Agree with above  2) Patient assistance: - In donut hole - copays more expensive during this time - Entresto copay once out of donut hole - $47 - Jardiance/Farxiga copay once out of donut hole - $47 - Farxiga assistance application initiated  3)  Education  - Patient has been educated on current HF medications and potential additions to HF medication regimen - Patient verbalizes understanding that over the next few months, these medication doses may change and more medications may be added to optimize HF regimen - Patient has been educated on basic disease state pathophysiology and goals of therapy   Kerby Nora, PharmD, BCPS Heart Failure Cytogeneticist Phone [phone]

## 2021-09-23 NOTE — Plan of Care (Signed)
  Problem: Clinical Measurements: Goal: Ability to maintain clinical measurements within normal limits will improve 09/23/2021 1146 by Leonie Man, RN Outcome: Not Progressing 09/23/2021 1146 by Leonie Man, RN Outcome: Progressing   Problem: Education: Goal: Knowledge of General Education information will improve Description: Including pain rating scale, medication(s)/side effects and non-pharmacologic comfort measures Outcome: Progressing   Problem: Health Behavior/Discharge Planning: Goal: Ability to manage health-related needs will improve Outcome: Progressing   Problem: Clinical Measurements: Goal: Will remain free from infection Outcome: Progressing Goal: Diagnostic test results will improve Outcome: Progressing Goal: Respiratory complications will improve Outcome: Progressing Goal: Cardiovascular complication will be avoided Outcome: Progressing   Problem: Activity: Goal: Risk for activity intolerance will decrease Outcome: Progressing   Problem: Nutrition: Goal: Adequate nutrition will be maintained Outcome: Progressing   Problem: Coping: Goal: Level of anxiety will decrease Outcome: Progressing   Problem: Elimination: Goal: Will not experience complications related to bowel motility Outcome: Progressing Goal: Will not experience complications related to urinary retention Outcome: Progressing   Problem: Pain Managment: Goal: General experience of comfort will improve Outcome: Progressing   Problem: Safety: Goal: Ability to remain free from injury will improve Outcome: Progressing   Problem: Skin Integrity: Goal: Risk for impaired skin integrity will decrease Outcome: Progressing

## 2021-09-23 NOTE — Assessment & Plan Note (Addendum)
Urine analysis negative for pyuria and no urinary symptoms.  Urine culture positive for E coli. Plan to treat with cephalexin for 3 days for possible urine infection (present on admission).   Doubt is a bacterial infection considering predominant lymphocytosis on differential cell count.  Will be reasonable to complete short course of antibiotic therapy.  Risk vs benefit will continue SGLT 2 inh for now.  I explained her this situation and she was in agreement to continue with SGLT 2 inh.

## 2021-09-23 NOTE — Hospital Course (Signed)
Julia West was admitted to the hospital with the working diagnosis of atrial fibrillation with rapid ventricular response.   66 yo female with the past medical history of coronary artery disease sp CABG 2003, hypertension, atrial fibrillation and obesity class 3 who presented with dyspnea and palpitations. Diagnosed with atrial fibrillation in 12/2020, sp cardioversion x2 and further rhythm control with Tikosyn. On the day of admission around 4:00 am reported sudden palpitations and dyspnea. On her initial physical examination she was tachycardic in atrial fibrillation rhythm and received 10 mg IV diltiazem. Blood pressure 130/105, HR 106, RR 21 and 02 saturation 92%. Lungs with bilateral rales, heart with S1 and S2 present, irregularly irregular, abdomen not distended and no lower extremity edema. Left BKA.   NA 140, K 3,6 CL 101, bicarbonate 23 glucose 151 bun 17 cr 1,0  BNP 304  High sensitive troponin 11 11  Wbc 14,8 hgb 14.1 plt 282  Urine analysis with SG 1,005   Chest radiograph with cardiomegaly, bilateral hilar vascular congestion CT chest negative for PE, bilateral ground glass opacities bilaterally. 7 mm nodule right lower lobe.   EKG 121 bpm, left axis deviation, qtc 474, atrial fibrillation rhythm with PVC, no significant ST segment or T wave changes.   Patient was placed on continuous infusion of diltiazem for rate control and underwent direct current cardioversion.  Anticoagulation with apixaban.  Received furosemide for volume overload.

## 2021-09-23 NOTE — Progress Notes (Signed)
Primary Cardiologist:  Hochrein  Subjective:  Successful DCC yesterday No complaints Wants to go home today   Objective:  Vitals:   09/22/21 1802 09/22/21 2000 09/22/21 2338 09/23/21 0309  BP: (!) 123/51 (!) 114/55 (!) 111/54 (!) 133/55  Pulse: 66 73 67 66  Resp: '18 15 18 20  '$ Temp: 98.1 F (36.7 C) 97.9 F (36.6 C) 98.8 F (37.1 C) 98.3 F (36.8 C)  TempSrc: Oral Oral Oral Oral  SpO2: 92% 92% 94% 93%  Weight:      Height:        Intake/Output from previous day:  Intake/Output Summary (Last 24 hours) at 09/23/2021 0806 Last data filed at 09/23/2021 0317 Gross per 24 hour  Intake 240 ml  Output 1100 ml  Net -860 ml    Physical Exam: Obese female Basilar crackles Distant heart sounds  Abdomen benign Trace edema Erythema on chest from [facility]   Lab Results: Basic Metabolic Panel: Recent Labs    09/20/21 1355 09/21/21 0042 09/21/21 1217 09/22/21 0051 09/23/21 0417  NA  --    < >  --  140 139  K  --    < >  --  4.3 4.3  CL  --    < >  --  106 102  CO2  --    < >  --  23 25  GLUCOSE  --    < >  --  115* 100*  BUN  --    < >  --  25* 26*  CREATININE  --    < >  --  1.21* 1.05*  CALCIUM  --    < >  --  8.6* 8.4*  MG 2.0  --  2.0  --   --    < > = values in this interval not displayed.   Liver Function Tests: Recent Labs    09/21/21 0042  AST 20  ALT 16  ALKPHOS 41  BILITOT 0.8  PROT 6.7  ALBUMIN 3.5   No results for input(s): "LIPASE", "AMYLASE" in the last 72 hours. CBC: Recent Labs    09/20/21 1355  WBC 14.8*  HGB 14.1  HCT 45.7  MCV 88.9  PLT 282    Thyroid Function Tests: Recent Labs    09/21/21 1217  TSH 0.951   Anemia Panel: No results for input(s): "VITAMINB12", "FOLATE", "FERRITIN", "TIBC", "IRON", "RETICCTPCT" in the last 72 hours.  Imaging: ECHOCARDIOGRAM COMPLETE  Result Date: 09/21/2021    ECHOCARDIOGRAM REPORT   Patient Name:   Julia West Date of Exam: 09/21/2021 Medical Rec #:  [id number]           Height:        66.0 in Accession #:    [id number]          Weight:       305.0 lb Date of Birth:  10/25/1955            BSA:          2.393 m Patient Age:    66 years            BP:           155/75 mmHg Patient Gender: F                   HR:           98 bpm. Exam Location:  Inpatient Procedure: 2D Echo, Cardiac Doppler and Color Doppler Indications:  Atrial fibrillation  History:        Patient has prior history of Echocardiogram examinations, most                 recent 06/18/2020. CHF, CAD, Arrythmias:Bradycardia,                 Signs/Symptoms:Chest Pain; Risk Factors:Hypertension, Diabetes                 and Dyslipidemia.  Sonographer:    Ronny Flurry Sonographer#2:  Melissa Morford RDCS (AE, PE) Referring Phys: [place]  1. Technically difficult study, consider limited echo with contrast to better evaluation wall motion. Left ventricular ejection fraction, by estimation, is 30 to 35%. The left ventricle has moderately decreased function. Left ventricular endocardial border not optimally defined to evaluate regional wall motion. There is mild left ventricular hypertrophy. Left ventricular diastolic parameters are indeterminate.  2. Right ventricular systolic function is normal. The right ventricular size is mildly enlarged. Tricuspid regurgitation signal is inadequate for assessing PA pressure.  3. Right atrial size was mildly dilated.  4. The mitral valve is normal in structure. No evidence of mitral valve regurgitation.  5. The aortic valve was not well visualized. Aortic valve regurgitation is trivial. No aortic stenosis is present.  6. The inferior vena cava is dilated in size with >50% respiratory variability, suggesting right atrial pressure of 8 mmHg. FINDINGS  Left Ventricle: Left ventricular ejection fraction, by estimation, is 30 to 35%. The left ventricle has moderately decreased function. Left ventricular endocardial border not optimally defined to evaluate regional wall motion. The  left ventricular internal cavity size was normal in size. There is mild left ventricular hypertrophy. Left ventricular diastolic parameters are indeterminate. Right Ventricle: The right ventricular size is mildly enlarged. No increase in right ventricular wall thickness. Right ventricular systolic function is normal. Tricuspid regurgitation signal is inadequate for assessing PA pressure. Left Atrium: Left atrial size was normal in size. Right Atrium: Right atrial size was mildly dilated. Pericardium: There is no evidence of pericardial effusion. Mitral Valve: The mitral valve is normal in structure. No evidence of mitral valve regurgitation. Tricuspid Valve: The tricuspid valve is normal in structure. Tricuspid valve regurgitation is trivial. Aortic Valve: The aortic valve was not well visualized. Aortic valve regurgitation is trivial. No aortic stenosis is present. Pulmonic Valve: The pulmonic valve was not well visualized. Pulmonic valve regurgitation is not visualized. Aorta: The aortic root and ascending aorta are structurally normal, with no evidence of dilitation. Venous: The inferior vena cava is dilated in size with greater than 50% respiratory variability, suggesting right atrial pressure of 8 mmHg. IAS/Shunts: The interatrial septum was not well visualized.  LEFT VENTRICLE PLAX 2D LVIDd:         5.10 cm LVIDs:         3.95 cm LV PW:         1.10 cm LV IVS:        1.15 cm LVOT diam:     2.20 cm LVOT Area:     3.80 cm  LEFT ATRIUM             Index        RIGHT ATRIUM           Index LA diam:        4.95 cm 2.07 cm/m   RA Area:     23.70 cm LA Vol (A2C):   60.3 ml 25.20 ml/m  RA Volume:  70.50 ml  29.47 ml/m LA Vol (A4C):   93.3 ml 38.99 ml/m LA Biplane Vol: 74.8 ml 31.26 ml/m   AORTA Ao Root diam: 2.80 cm Ao Asc diam:  3.50 cm  SHUNTS Systemic Diam: 2.20 cm Oswaldo Milian MD Electronically signed by Oswaldo Milian MD Signature Date/Time: 09/21/2021/11:30:45 AM    Final     Cardiac  Studies:  ECG: afib nonspecific ST changes PVC;s    Telemetry: NSR PVC;s   Echo: EF 30-35% LA moderately dilated 49 mm  Medications:    apixaban  5 mg Oral BID   atorvastatin  20 mg Oral Daily   Chlorhexidine Gluconate Cloth  6 each Topical Q0600   dofetilide  125 mcg Oral BID   fenofibrate  160 mg Oral Daily   furosemide  40 mg Oral Daily   irbesartan  300 mg Oral Daily   mupirocin ointment  1 Application Nasal BID   potassium chloride  40 mEq Oral Once   sertraline  50 mg Oral Daily   sodium chloride flush  3 mL Intravenous Q12H      sodium chloride      Assessment/Plan:   PAF:  On low dose Tikosyn with long QT at higher doses [facility] May and ERAF ? Rate related drop in EF Presented with rapid afib and CHF Improved with lasix No missed doses of eliquis Successful DCC yesterday with NSR on telemetry Has F/U with Camnitz to arrange ablation  QT 489 msec post cardiioversion  CHF:  ? Tachy mediated DCM  start low dose coreg and continue lasix and irbesartan.  F/U echo in 3 months  HLD:  continue statin   Jenkins Rouge 09/23/2021, 8:06 AM

## 2021-09-24 DIAGNOSIS — I10 Essential (primary) hypertension: Secondary | ICD-10-CM | POA: Diagnosis not present

## 2021-09-24 DIAGNOSIS — I5023 Acute on chronic systolic (congestive) heart failure: Secondary | ICD-10-CM | POA: Diagnosis not present

## 2021-09-24 DIAGNOSIS — I4891 Unspecified atrial fibrillation: Secondary | ICD-10-CM | POA: Diagnosis not present

## 2021-09-24 DIAGNOSIS — I2510 Atherosclerotic heart disease of native coronary artery without angina pectoris: Secondary | ICD-10-CM | POA: Diagnosis not present

## 2021-09-24 LAB — BASIC METABOLIC PANEL
Anion gap: 5 (ref 5–15)
BUN: 26 mg/dL — ABNORMAL HIGH (ref 8–23)
CO2: 31 mmol/L (ref 22–32)
Calcium: 8.5 mg/dL — ABNORMAL LOW (ref 8.9–10.3)
Chloride: 101 mmol/L (ref 98–111)
Creatinine, Ser: 1.24 mg/dL — ABNORMAL HIGH (ref 0.44–1.00)
GFR, Estimated: 47 mL/min — ABNORMAL LOW (ref >60)
Glucose, Bld: 95 mg/dL (ref 70–99)
Potassium: 4.5 mmol/L (ref 3.5–5.1)
Sodium: 137 mmol/L (ref 135–145)

## 2021-09-24 LAB — MAGNESIUM: Magnesium: 2.2 mg/dL (ref 1.7–2.4)

## 2021-09-24 MED ORDER — FUROSEMIDE 20 MG PO TABS
20.0000 mg | ORAL_TABLET | Freq: Every day | ORAL | Status: DC
  Administered 2021-09-25: 20 mg via ORAL
  Filled 2021-09-24: qty 1

## 2021-09-24 MED ORDER — SPIRONOLACTONE 12.5 MG HALF TABLET
12.5000 mg | ORAL_TABLET | Freq: Every day | ORAL | Status: DC
  Administered 2021-09-24 – 2021-09-25 (×2): 12.5 mg via ORAL
  Filled 2021-09-24 (×2): qty 1

## 2021-09-24 MED ORDER — CEPHALEXIN 250 MG PO CAPS
250.0000 mg | ORAL_CAPSULE | Freq: Three times a day (TID) | ORAL | Status: DC
  Administered 2021-09-24 – 2021-09-25 (×3): 250 mg via ORAL
  Filled 2021-09-24 (×4): qty 1

## 2021-09-24 NOTE — Progress Notes (Signed)
Progress Note   Patient: Julia West NGE:[id number] DOB: 1955-08-05 DOA: 09/20/2021     3 DOS: the patient was seen and examined on 09/24/2021   Brief hospital course: Mrs. Kelliher was admitted to the hospital with the working diagnosis of atrial fibrillation with rapid ventricular response.   66 yo female with the past medical history of coronary artery disease sp CABG 2003, hypertension, atrial fibrillation and obesity class 3 who presented with dyspnea and palpitations. Diagnosed with atrial fibrillation in 12/2020, sp cardioversion x2 and further rhythm control with Tikosyn. On the day of admission around 4:00 am reported sudden palpitations and dyspnea. On her initial physical examination she was tachycardic in atrial fibrillation rhythm and received 10 mg IV diltiazem. Blood pressure 130/105, HR 106, RR 21 and 02 saturation 92%. Lungs with bilateral rales, heart with S1 and S2 present, irregularly irregular, abdomen not distended and no lower extremity edema. Left BKA.   NA 140, K 3,6 CL 101, bicarbonate 23 glucose 151 bun 17 cr 1,0  BNP 304  High sensitive troponin 11 11  Wbc 14,8 hgb 14.1 plt 282  Urine analysis with SG 1,005   Chest radiograph with cardiomegaly, bilateral hilar vascular congestion CT chest negative for PE, bilateral ground glass opacities bilaterally. 7 mm nodule right lower lobe.   EKG 121 bpm, left axis deviation, qtc 474, atrial fibrillation rhythm with PVC, no significant ST segment or T wave changes.   Patient was placed on continuous infusion of diltiazem for rate control and underwent direct current cardioversion.  Anticoagulation with apixaban.  Received furosemide for volume overload.    Assessment and Plan: * Acute on chronic systolic CHF (congestive heart failure) (HCC) Echocardiogram with reduced LV systolic function with EF 30 to 35%. Mild LVH. Preserved RV systolic function with mild enlarge cavity.   Urine output 2.500 ml over last 24 hrs.   Systolic blood pressure 401 to 130 mmHg.  Oxygenation is 93% on room air.   Medical therapy with carvedilol, irbesartan, SGLT2 Inh and furosemide.  Spironolactone added today.   Acute hypoxemic respiratory failure due to acute cardiogenic pulmonary edema.   Patient now on room air.   Atrial fibrillation with rapid ventricular response ([place]) Patient has converted to sinus rhythm. EKG with junctional PVC and PAC.   Telemetry with improved ectopy.   Plan to continue rhythm control with dofetilide and  carvedilol. Anticoagulation with apixaban.  Out of bed to chair tid with meals.    Essential hypertension Continue blood pressure control with irbersartan and carvedilol. Continue diuresis with furosemide.   Dyslipidemia, continue with statin therapy.   Coronary artery disease involving native coronary artery of native heart without angina pectoris Trop neg today x2 Cont statin  Class 3 severe obesity with serious comorbidity and body mass index (BMI) of 50.0 to 59.9 in adult (HCC) Calculated BMI is 49.2    Leukocytosis No signs of infection, continue to hold on antibiotic therapy Worsening leukocytosis, with predominant lymphocytes.  Urine analysis negative for pyuria and no urinary symptoms.  Urine culture positive for E coli. Will add cephalexin for 3 days for possible urine infection (present on admission).  Check wbc in am.         Subjective: Patient with no chest pain or dyspnea, no dysuria or increase urinary frequency.   Physical Exam: Vitals:   09/23/21 2338 09/24/21 0340 09/24/21 0407 09/24/21 0740  BP: 124/69 124/83  (!) 132/55  Pulse: 61 64  (!) 56  Resp: 17 20  20  Temp: 98.2 F (36.8 C) 98.3 F (36.8 C)  98.5 F (36.9 C)  TempSrc: Oral Oral  Oral  SpO2: 92% 92%  93%  Weight:   (!) 138.5 kg   Height:       Neurology awake and alert  ENT with no pallor Cardiovascular with S1 and S2 present and rhythmic  Respiratory with no  wheezing Abdomen not distended  No lower extremity edema  Data Reviewed:    Family Communication: I spoke with patient's daughter at the bedside, we talked in detail about patient's condition, plan of care and prognosis and all questions were addressed.   Disposition: Status is: Inpatient Remains inpatient appropriate because: leukocytosis   Planned Discharge Destination: Home      Author: Tawni Millers, MD 09/24/2021 10:54 AM  For on call review [email or web address].

## 2021-09-24 NOTE — Plan of Care (Signed)
  Problem: Clinical Measurements: Goal: Respiratory complications will improve Outcome: Progressing Goal: Cardiovascular complication will be avoided Outcome: Progressing   Problem: Activity: Goal: Risk for activity intolerance will decrease Outcome: Progressing   Problem: Coping: Goal: Level of anxiety will decrease Outcome: Progressing   Problem: Pain Managment: Goal: General experience of comfort will improve Outcome: Progressing   Problem: Safety: Goal: Ability to remain free from injury will improve Outcome: Progressing

## 2021-09-24 NOTE — Progress Notes (Signed)
Primary Cardiologist:  Hochrein  Subjective:  Successful [facility] 7/11 kept in hospital for elevated WBC    Objective:  Vitals:   09/23/21 2338 09/24/21 0340 09/24/21 0407 09/24/21 0740  BP: 124/69 124/83  (!) 132/55  Pulse: 61 64  (!) 56  Resp: '17 20  20  '$ Temp: 98.2 F (36.8 C) 98.3 F (36.8 C)  98.5 F (36.9 C)  TempSrc: Oral Oral  Oral  SpO2: 92% 92%  93%  Weight:   (!) 138.5 kg   Height:        Intake/Output from previous day:  Intake/Output Summary (Last 24 hours) at 09/24/2021 0823 Last data filed at 09/24/2021 0738 Gross per 24 hour  Intake 663 ml  Output 2550 ml  Net -1887 ml    Physical Exam: Obese female Basilar crackles Distant heart sounds  Abdomen benign Trace edema E[facility]thema on chest from [facility]   Lab Results: Basic Metabolic Panel: Recent Labs    09/21/21 1217 09/22/21 0051 09/23/21 0417 09/24/21 0242  NA  --    < > 139 137  K  --    < > 4.3 4.5  CL  --    < > 102 101  CO2  --    < > 25 31  GLUCOSE  --    < > 100* 95  BUN  --    < > 26* 26*  CREATININE  --    < > 1.05* 1.24*  CALCIUM  --    < > 8.4* 8.5*  MG 2.0  --   --  2.2   < > = values in this interval not displayed.   Liver Function Tests: No results for input(s): "AST", "ALT", "ALKPHOS", "BILITOT", "PROT", "ALBUMIN" in the last 72 hours.  No results for input(s): "LIPASE", "AMYLASE" in the last 72 hours. CBC: Recent Labs    09/23/21 0417 09/23/21 1834  WBC 15.5* 17.8*  NEUTROABS  --  2.1  HGB 13.1 13.7  HCT 41.3 43.4  MCV 89.0 87.9  PLT 243 311    Thyroid Function Tests: Recent Labs    09/21/21 1217  TSH 0.951   Anemia Panel: No results for input(s): "VITAMINB12", "FOLATE", "FERRITIN", "TIBC", "IRON", "RETICCTPCT" in the last 72 hours.  Imaging: No results found.  Cardiac Studies:  ECG: afib nonspecific ST changes PVC;s    Telemetry: NSR PVC;s   Echo: EF 30-35% LA moderately dilated 49 mm  Medications:    apixaban  5 mg Oral BID   atorvastatin  20 mg Oral  Daily   carvedilol  3.125 mg Oral BID WC   Chlorhexidine Gluconate Cloth  6 each Topical Q0600   dapagliflozin propanediol  10 mg Oral Daily   dofetilide  125 mcg Oral BID   fenofibrate  160 mg Oral Daily   irbesartan  300 mg Oral Daily   mupirocin ointment  1 Application Nasal BID   potassium chloride  20 mEq Oral Daily   sertraline  50 mg Oral Daily   sodium chloride flush  3 mL Intravenous Q12H      sodium chloride      Assessment/Plan:   PAF:  On low dose Tikosyn with long QT at higher doses [facility] May and ERAF ? Rate related drop in EF Presented with rapid afib and CHF Improved with lasix No missed doses of eliquis Successful [facility] 7/11  with NSR on telemetry Has F/U with Camnitz to arrange ablation  QT 489 msec post cardiioversion  CHF:  ?  Tachy mediated DCM   coreg started 03/26/21 and continue lasix and irbesartan.  F/U echo in 3 months  HLD:  continue statin  WBC elevated 17.8 primary Lymphs w/u per primary service   Jenkins Rouge 09/24/2021, 8:23 AM

## 2021-09-24 NOTE — Evaluation (Signed)
Physical Therapy Evaluation Patient Details Name: Julia West MRN: [id number] DOB: 22-Jan-1956 Today's Date: 09/24/2021  History of Present Illness  Pt is a 66 y.o. female admitted 09/20/21 with c/o SOB, edema. Workup for afib RVR. CXR with pulmonary vascular congestion. ECHO 7/10 with new reduction in LVEF 30-35%. S/p successful DCCV 7/11. PMH includes L BKA (2019), HTN, CAD (s/p CABG 2003), HLD, afib, DM2, bowel resection.   Clinical Impression  Patient evaluated by Physical Therapy with no further acute PT needs identified. PTA, pt mod indep with use of crutches for community ambulation, lives with supportive spouse. Today, pt mod indep with ADLs and mobility using crutches; asymptomatic. All education has been completed and the patient has no further questions. Acute PT is signing off. Thank you for this referral.    HR 47-60s (afib?)    Recommendations for follow up therapy are one component of a multi-disciplinary discharge planning process, led by the attending physician.  Recommendations may be updated based on patient status, additional functional criteria and insurance authorization.  Follow Up Recommendations No PT follow up      Assistance Recommended at Discharge PRN  Patient can return home with the following  Assistance with cooking/housework;Assist for transportation    Equipment Recommendations None recommended by PT  Recommendations for Other Services       Functional Status Assessment Patient has not had a recent decline in their functional status     Precautions / Restrictions Precautions Precautions: Fall;Other (comment) Precaution Comments: h/o L BKA (has prosthetic in room) Restrictions Weight Bearing Restrictions: No      Mobility  Bed Mobility Overal bed mobility: Independent                  Transfers Overall transfer level: Modified independent Equipment used: None, Crutches               General transfer comment: able to  stand from EOB, low toilet height and recliner with and without crutches    Ambulation/Gait Ambulation/Gait assistance: Modified independent (Device/Increase time) Gait Distance (Feet): 24 Feet Assistive device: Crutches, None Gait Pattern/deviations: Step-through pattern, Decreased stride length, Trunk flexed Gait velocity: Decreased     General Gait Details: mod indep ambulating throughout room with crutches; able to take a few steps without DME, but preference for crutches  Stairs            Wheelchair Mobility    Modified Rankin (Stroke Patients Only)       Balance Overall balance assessment: Mild deficits observed, not formally tested   Sitting balance-Leahy Scale: Good Sitting balance - Comments: indep with pericare; pt had donned LLE prosthetic without assist sitting EOB   Standing balance support: No upper extremity supported, During functional activity Standing balance-Leahy Scale: Fair Standing balance comment: can static stand and take steps without UE support                             Pertinent Vitals/Pain Pain Assessment Pain Assessment: No/denies pain    Home Living Family/patient expects to be discharged to:: Private residence Living Arrangements: Spouse/significant other Available Help at Discharge: Family;Available 24 hours/day Type of Home: House Home Access: Ramped entrance       Home Layout: One level Home Equipment: Conservation officer, nature (2 wheels);Crutches;Grab bars - tub/shower;Grab bars - toilet Additional Comments: husband made guest bathroom handicapped accessible    Prior Function Prior Level of Function : Independent/Modified Independent  Mobility Comments: Mod indep household ambulation without DME, community ambulation with bilateral crutches ("they're my security blanket") ADLs Comments: Mod indep with ADLs; husband assists with household tasks     Hand Dominance        Extremity/Trunk Assessment    Upper Extremity Assessment Upper Extremity Assessment: Overall WFL for tasks assessed    Lower Extremity Assessment Lower Extremity Assessment: Overall WFL for tasks assessed (h/o L BKA; prosthetic donned and functional strength >3/5)       Communication   Communication: No difficulties  Cognition Arousal/Alertness: Awake/alert Behavior During Therapy: WFL for tasks assessed/performed Overall Cognitive Status: Within Functional Limits for tasks assessed                                          General Comments General comments (skin integrity, edema, etc.): HR 47-60s (afib?)    Exercises     Assessment/Plan    PT Assessment Patient does not need any further PT services  PT Problem List         PT Treatment Interventions      PT Goals (Current goals can be found in the Care Plan section)  Acute Rehab PT Goals PT Goal Formulation: All assessment and education complete, DC therapy    Frequency       Co-evaluation               AM-PAC PT "6 Clicks" Mobility  Outcome Measure Help needed turning from your back to your side while in a flat bed without using bedrails?: None Help needed moving from lying on your back to sitting on the side of a flat bed without using bedrails?: None Help needed moving to and from a bed to a chair (including a wheelchair)?: None Help needed standing up from a chair using your arms (e.g., wheelchair or bedside chair)?: None Help needed to walk in hospital room?: None Help needed climbing 3-5 steps with a railing? : A Little 6 Click Score: 23    End of Session   Activity Tolerance: Patient tolerated treatment well Patient left: in chair;with call bell/phone within reach Nurse Communication: Mobility status PT Visit Diagnosis: Other abnormalities of gait and mobility (R26.89)    Time: 1050-1104 PT Time Calculation (min) (ACUTE ONLY): 14 min   Charges:   PT Evaluation $PT Eval Low Complexity: 1 Low         Mabeline Caras, PT, DPT Acute Rehabilitation Services  Personal: [facility] Office: [phone]  Derry Lory 09/24/2021, 12:12 PM

## 2021-09-24 NOTE — Progress Notes (Signed)
Heart Failure Stewardship Pharmacist Progress Note   PCP: [name], MD PCP-Cardiologist: [name], MD    HPI:  66 yo F with PMH of CAD s/p CABG 2003, obesity, left BKA, HTN, HLD, and afib.   She presented to the ED on 7/9 with shortness of breath and edema. Found to be in afib RVR. CXR with pulmonary vascular congestion. CTA negative for PE showing infiltrate vs edema. ECHO 7/10 with new reduction in LVEF 30-35% (was 50-55% 06/2020; 60-65% 06/2017), mild LVH, RV ok.   S/p successful DCCV 7/11.  Current HF Medications: Beta Blocker: carvedilol 3.125 mg BID ACE/ARB/ARNI: irbesartan 300 mg daily SGLT2i: Farxiga 10 mg daily   Prior to admission HF Medications: Diuretic: furosemide 20 mg daily PRN ACE/ARB/ARNI: valsartan 320 mg daily  Pertinent Lab Values: Serum creatinine 1.24, BUN 26, Potassium 4.5, Sodium 137, BNP 304.3, Magnesium 2.2, A1c 5.7   Vital Signs: Weight: 305 lbs (admission weight: 305 lbs) Blood pressure: 120-130/60s Heart rate: 60s NSR I/O: -2.4L yesterday; net -4.2L  Medication Assistance / Insurance Benefits Check: Does the patient have prescription insurance?  Yes Type of insurance plan: UHC Medicare  Does the patient qualify for medication assistance through manufacturers or grants?   Yes Eligible grants and/or patient assistance programs: Lisabeth Register Medication assistance applications in progress: [place]  Medication assistance applications approved: none Approved medication assistance renewals will be completed by: pending  Outpatient Pharmacy:  [facility] Is the patient willing to use [place] at discharge? Yes Is the patient willing to transition their outpatient pharmacy to utilize a [facility] outpatient pharmacy?   Pending    Assessment: 1. Acute on chronic systolic CHF (LVEF 43-15%), due to presumed NICM with afib RVR. NYHA class III symptoms. - No further IV lasix doses ordered. Has  chronic lymphedema. Keep K>4 and Mag>2. - Noted she has bradycardia when in sinus rhythm - continue low-dose carvedilol carefully. HR stable 60s. - Consider transitioning ARB to [facility] with new worsened LVEF - Consider starting spironolactone 25 mg daily with new worsened LVEF - Continue Farxiga 10 mg daily   Plan: 1) Medication changes recommended at this time: - Start spironolactone 12.5 mg daily  2) Patient assistance: - In donut hole - copays more expensive during this time - Entresto copay once out of donut hole - $47 - Jardiance/Farxiga copay once out of donut hole - $47 - Farxiga assistance application initiated  3)  Education  - Patient has been educated on current HF medications and potential additions to HF medication regimen - Patient verbalizes understanding that over the next few months, these medication doses may change and more medications may be added to optimize HF regimen - Patient has been educated on basic disease state pathophysiology and goals of therapy   Kerby Nora, PharmD, BCPS Heart Failure Cytogeneticist Phone [phone]

## 2021-09-24 NOTE — Progress Notes (Signed)
OT Cancellation Note  Patient Details Name: Julia West MRN: [id number] DOB: 1955-12-11   Cancelled Treatment:    Reason Eval/Treat Not Completed: OT screened, no needs identified, will sign off. Pt up with PT ambulating independently and able to complete ADL's with no assist. Pt reports no further skilled OT needs. Acute OT will sign off.   Marcille Barman Elane Carolena Fairbank 09/24/2021, 4:09 PM

## 2021-09-24 NOTE — Care Management Important Message (Signed)
Important Message  Patient Details  Name: Julia West MRN: [id number] Date of Birth: 1955-08-31   Medicare Important Message Given:  Yes     Orbie Pyo 09/24/2021, 2:18 PM

## 2021-09-25 ENCOUNTER — Telehealth (HOSPITAL_BASED_OUTPATIENT_CLINIC_OR_DEPARTMENT_OTHER): Payer: Self-pay | Admitting: Family

## 2021-09-25 ENCOUNTER — Other Ambulatory Visit (HOSPITAL_COMMUNITY): Payer: Self-pay

## 2021-09-25 DIAGNOSIS — I2510 Atherosclerotic heart disease of native coronary artery without angina pectoris: Secondary | ICD-10-CM | POA: Diagnosis not present

## 2021-09-25 DIAGNOSIS — I5023 Acute on chronic systolic (congestive) heart failure: Secondary | ICD-10-CM | POA: Diagnosis not present

## 2021-09-25 DIAGNOSIS — I4891 Unspecified atrial fibrillation: Secondary | ICD-10-CM | POA: Diagnosis not present

## 2021-09-25 DIAGNOSIS — I10 Essential (primary) hypertension: Secondary | ICD-10-CM | POA: Diagnosis not present

## 2021-09-25 LAB — CBC WITH DIFFERENTIAL/PLATELET
Abs Immature Granulocytes: 0.05 10*3/uL (ref 0.00–0.07)
Basophils Absolute: 0.1 10*3/uL (ref 0.0–0.1)
Basophils Relative: 0 %
Eosinophils Absolute: 0.6 10*3/uL — ABNORMAL HIGH (ref 0.0–0.5)
Eosinophils Relative: 4 %
HCT: 40.7 % (ref 36.0–46.0)
Hemoglobin: 12.9 g/dL (ref 12.0–15.0)
Immature Granulocytes: 0 %
Lymphocytes Relative: 44 %
Lymphs Abs: 7.3 10*3/uL — ABNORMAL HIGH (ref 0.7–4.0)
MCH: 27.9 pg (ref 26.0–34.0)
MCHC: 31.7 g/dL (ref 30.0–36.0)
MCV: 88.1 fL (ref 80.0–100.0)
Monocytes Absolute: 1.3 10*3/uL — ABNORMAL HIGH (ref 0.1–1.0)
Monocytes Relative: 8 %
Neutro Abs: 7.2 10*3/uL (ref 1.7–7.7)
Neutrophils Relative %: 44 %
Platelets: 254 10*3/uL (ref 150–400)
RBC: 4.62 MIL/uL (ref 3.87–5.11)
RDW: 15.6 % — ABNORMAL HIGH (ref 11.5–15.5)
Smear Review: NORMAL
WBC: 16.5 10*3/uL — ABNORMAL HIGH (ref 4.0–10.5)
nRBC: 0 % (ref 0.0–0.2)

## 2021-09-25 LAB — BASIC METABOLIC PANEL
Anion gap: 7 (ref 5–15)
BUN: 26 mg/dL — ABNORMAL HIGH (ref 8–23)
CO2: 28 mmol/L (ref 22–32)
Calcium: 9 mg/dL (ref 8.9–10.3)
Chloride: 103 mmol/L (ref 98–111)
Creatinine, Ser: 1.12 mg/dL — ABNORMAL HIGH (ref 0.44–1.00)
GFR, Estimated: 54 mL/min — ABNORMAL LOW (ref >60)
Glucose, Bld: 94 mg/dL (ref 70–99)
Potassium: 4.6 mmol/L (ref 3.5–5.1)
Sodium: 138 mmol/L (ref 135–145)

## 2021-09-25 LAB — PATHOLOGIST SMEAR REVIEW

## 2021-09-25 MED ORDER — SPIRONOLACTONE 25 MG PO TABS
12.5000 mg | ORAL_TABLET | Freq: Every day | ORAL | 0 refills | Status: DC
  Filled 2021-09-25: qty 15, 30d supply, fill #0

## 2021-09-25 MED ORDER — CEPHALEXIN 250 MG PO CAPS
250.0000 mg | ORAL_CAPSULE | Freq: Three times a day (TID) | ORAL | 0 refills | Status: AC
  Filled 2021-09-25: qty 6, 2d supply, fill #0

## 2021-09-25 MED ORDER — CARVEDILOL 3.125 MG PO TABS
3.1250 mg | ORAL_TABLET | Freq: Two times a day (BID) | ORAL | 0 refills | Status: DC
  Filled 2021-09-25: qty 60, 30d supply, fill #0

## 2021-09-25 MED ORDER — DAPAGLIFLOZIN PROPANEDIOL 10 MG PO TABS
10.0000 mg | ORAL_TABLET | Freq: Every day | ORAL | 0 refills | Status: DC
  Filled 2021-09-25: qty 30, 30d supply, fill #0

## 2021-09-25 NOTE — Telephone Encounter (Signed)
RN called patient to inform them of appointment on 7/21 at 8:25am with Laurann Montana, NP at [facility]. Address provided. Asked patient to bring any questions and all medication bottles with them. Patient verbalized understanding

## 2021-09-25 NOTE — Plan of Care (Signed)

## 2021-09-25 NOTE — Progress Notes (Signed)
Heart Failure Stewardship Pharmacist Progress Note   PCP: [name], MD PCP-Cardiologist: [name], MD    HPI:  66 yo F with PMH of CAD s/p CABG 2003, obesity, left BKA, HTN, HLD, and afib.   She presented to the ED on 7/9 with shortness of breath and edema. Found to be in afib RVR. CXR with pulmonary vascular congestion. CTA negative for PE showing infiltrate vs edema. ECHO 7/10 with new reduction in LVEF 30-35% (was 50-55% 06/2020; 60-65% 06/2017), mild LVH, RV ok.   S/p successful DCCV 7/11.  Current HF Medications: Beta Blocker: carvedilol 3.125 mg BID ACE/ARB/ARNI: irbesartan 300 mg daily MRA: spironolactone 12.5 mg daily SGLT2i: Farxiga 10 mg daily   Prior to admission HF Medications: Diuretic: furosemide 20 mg daily PRN ACE/ARB/ARNI: valsartan 320 mg daily  Pertinent Lab Values: Serum creatinine 1.12, BUN 26, Potassium 4.6, Sodium 138, BNP 304.3, Magnesium 2.2, A1c 5.7   Vital Signs: Weight: 306 lbs (admission weight: 305 lbs) Blood pressure: 120-130/60s Heart rate: 50-60s NSR I/O: -766m yesterday; net -5.7L  Medication Assistance / Insurance Benefits Check: Does the patient have prescription insurance?  Yes Type of insurance plan: UHC Medicare  Does the patient qualify for medication assistance through manufacturers or grants?   Yes Eligible grants and/or patient assistance programs: [place]abeth Register[place]ication assistance applications in progress: [place] Medication assistance applications approved: none Approved medication assistance renewals will be completed by: pending  [facility]patient Pharmacy[facility] [place] Is the [facility]   Pending    Assessment: 1. Acute on chronic systolic CHF (LVEF 347-82%, due to presumed NICM with afib RVR. NYHA class II symptoms. - No  further IV lasix doses ordered. Has chronic lymphedema. Keep K>4 and Mag>2. - Noted she has bradycardia when in sinus rhythm - continue low-dose carvedilol carefully. HR stable 50-60s. - [facility] - Continue spironolactone 12.5 mg daily - Continue Farxiga 10 mg daily   Plan: 1) Medication changes recommended at this time: - Continue current therapy  2) Patient assistance: - In donut hole - copays more expensive during this time - Entresto copay once out of donut hole - $47 - Jardiance/Farxiga copay once out of donut hole - $47 - Farxiga assistance application initiated  3)  Education  - Patient has been educated on current HF medications and potential additions to HF medication regimen - Patient verbalizes understanding that over the next few months, these medication doses may change and more medications may be added to optimize HF regimen - Patient has been educated on basic disease state pathophysiology and goals of therapy   MKerby Nora PharmD, BCPS Heart Failure SCytogeneticistPhone ([phone]

## 2021-09-25 NOTE — Telephone Encounter (Signed)
Please call [place]   Appointment with   Laurann Montana on 10-02-21

## 2021-09-25 NOTE — TOC Transition Note (Signed)
Transition of Care [facility]) - CM/SW Discharge Note   Patient Details  Name: AFIYA FERREBEE MRN: [id number] Date of Birth: 09/15/1955  [facility]) CM/SW Contact:  Angelita Ingles, RN Phone Number:[phone]  09/25/2021, 9:43 AM   Clinical Narrative:    Patient with discharge orders. No TOC needs noted. TOC will sign off.         Patient Goals and CMS Choice        Discharge Placement                       Discharge Plan and Services                                     Social Determinants of Health (SDOH) Interventions Food Insecurity Interventions: Intervention Not Indicated Financial Strain Interventions: Intervention Not Indicated Housing Interventions: Intervention Not Indicated Transportation Interventions: Intervention Not Indicated   Readmission Risk Interventions     No data to display

## 2021-09-25 NOTE — Consult Note (Signed)
   [facility] Inpatient Consult   09/25/2021  Julia West 1955/08/19 [id number]  [facility] [ACO] Patient: [employer] Medicare  Primary Care Provider:  [name], MD is at [place],  is an embedded provider with a Chronic Care Management team and program, and is listed for the transition of care follow up and appointments.  Patient has been with  Embedded practice service needs for chronic care managementwith Embedded pharmacist noted.  Plan: Notification sent to the Embedded Care Management  team and made aware of TOC needs for post hospital needs, new meds.  Please contact for further questions,  Natividad Brood, RN BSN [facility] Liaison  [phone] business mobile phone Toll free office [phone]  Fax number: [phone] [email or web address]

## 2021-09-25 NOTE — Discharge Summary (Signed)
Physician Discharge Summary   Patient: EULINE KIMBLER MRN: [id number] DOB: [birth year]  Admit date:     09/20/2021  Discharge date: 09/25/21  Discharge Physician: Jimmy Picket Kennon Encinas   PCP: [name], MD   Recommendations at discharge:    Patient has been placed on SGLT 2 inh and spironolactone. Continue diuresis with furosemide Continue with dofetilide and follow up as outpatient with electrophysiology.   Discharge Diagnoses: Principal Problem:   Acute on chronic systolic CHF (congestive heart failure) (HCC) Active Problems:   Atrial fibrillation with rapid ventricular response (HCC)   Essential hypertension   Coronary artery disease involving native coronary artery of native heart without angina pectoris   Class 3 severe obesity with serious comorbidity and body mass index (BMI) of 50.0 to 59.9 in adult ([place])   Leukocytosis  Resolved Problems:   * No resolved hospital problems. [facility] Course: Mrs. Tietje was admitted to the hospital with the working diagnosis of atrial fibrillation with rapid ventricular response.   66 yo female with the past medical history of coronary artery disease sp CABG 2003, hypertension, atrial fibrillation and obesity class 3 who presented with dyspnea and palpitations. Diagnosed with atrial fibrillation in 12/2020, sp cardioversion x2 and further rhythm control with Tikosyn. On the day of admission around 4:00 am reported sudden palpitations and dyspnea. On her initial physical examination she was tachycardic in atrial fibrillation rhythm and received 10 mg IV diltiazem. Blood pressure 130/105, HR 106, RR 21 and 02 saturation 92%. Lungs with bilateral rales, heart with S1 and S2 present, irregularly irregular, abdomen not distended and no lower extremity edema. Left BKA.   NA 140, K 3,6 CL 101, bicarbonate 23 glucose 151 bun 17 cr 1,0  BNP 304  High sensitive troponin 11 11  Wbc 14,8 hgb 14.1 plt 282  Urine analysis with SG 1,005    Chest radiograph with cardiomegaly, bilateral hilar vascular congestion CT chest negative for PE, bilateral ground glass opacities bilaterally. 7 mm nodule right lower lobe.   EKG 121 bpm, left axis deviation, qtc 474, atrial fibrillation rhythm with PVC, no significant ST segment or T wave changes.   Patient was placed on continuous infusion of diltiazem for rate control and underwent direct current cardioversion.  Anticoagulation with apixaban.  Received furosemide for volume overload.    Assessment and Plan: * Acute on chronic systolic CHF (congestive heart failure) (HCC) Echocardiogram with reduced LV systolic function with EF 30 to 35%. Mild LVH. Preserved RV systolic function with mild enlarge cavity.   Patient was placed on IV furosemide for diuresis, negative fluid balance was achieved -5,673 ml, with improvement of her symptoms.   Possible reduction in LV systolic function due to atrial fibrillation.   Medical therapy with carvedilol, dapagliflozin, valsartan and spironolactone Continue diuresis with furosemide.   Acute hypoxemic respiratory failure due to acute cardiogenic pulmonary edema.   Clinically resolved.   Atrial fibrillation with rapid ventricular response ([place]) Patient has converted to sinus rhythm. EKG with junctional PVC and PAC.    Plan to continue rhythm control with dofetilide and  carvedilol. Anticoagulation with apixaban.  Patient will follow up with electrophysiology as outpatient     Essential hypertension Continue blood pressure control with valsartan and carvedilol. Continue diuresis with furosemide.   Dyslipidemia, continue with statin therapy.   Coronary artery disease involving native coronary artery of native heart without angina pectoris Acute coronary syndrome was ruled out.   Class 3 severe obesity with serious comorbidity and  body mass index (BMI) of 50.0 to 59.9 in adult [facility]) Calculated BMI is 49.2    Leukocytosis  Urine  analysis negative for pyuria and no urinary symptoms.  Urine culture positive for E coli. Plan to treat with cephalexin for 3 days for possible urine infection (present on admission).   Doubt is a bacterial infection considering predominant lymphocytosis on differential cell count.  Will be reasonable to complete short course of antibiotic therapy.  Risk vs benefit will continue SGLT 2 inh for now.          Consultants: cardiology  Procedures performed: direct current cardioversion   Disposition: Home Diet recommendation:  Cardiac diet DISCHARGE MEDICATION: Allergies as of 09/25/2021       Reactions   Ace Inhibitors Cough        Medication List     STOP taking these medications    amLODipine 2.5 MG tablet Commonly known as: NORVASC       TAKE these medications    acetaminophen 325 MG tablet Commonly known as: TYLENOL Take 650 mg by mouth 2 (two) times daily as needed (back ache).   atorvastatin 20 MG tablet Commonly known as: LIPITOR TAKE 1 TABLET BY MOUTH DAILY   Belsomra 20 MG Tabs Generic drug: Suvorexant Take 1 tablet by mouth at bedtime as needed. What changed:  how much to take when to take this   carvedilol 3.125 MG tablet Commonly known as: COREG Take 1 tablet (3.125 mg total) by mouth 2 (two) times daily with a meal.   cephALEXin 250 MG capsule Commonly known as: KEFLEX Take 1 capsule (250 mg total) by mouth every 8 (eight) hours for 2 days.   dapagliflozin propanediol 10 MG Tabs tablet Commonly known as: FARXIGA Take 1 tablet (10 mg total) by mouth daily.   dofetilide 125 MCG capsule Commonly known as: TIKOSYN Take 1 capsule (125 mcg total) by mouth 2 (two) times daily.   Eliquis 5 MG Tabs tablet Generic drug: apixaban TAKE 1 TABLET BY MOUTH TWICE  DAILY What changed: how much to take   fenofibrate 160 MG tablet TAKE 1 TABLET BY MOUTH DAILY   fexofenadine 180 MG tablet Commonly known as: ALLEGRA Take 1 tablet (180 mg total) by  mouth daily. For allergy symptoms   fluticasone 50 MCG/ACT nasal spray Commonly known as: FLONASE Place 1 spray into both nostrils daily as needed for allergies or rhinitis.   furosemide 20 MG tablet Commonly known as: LASIX Take 1 tablet (20 mg total) by mouth daily as needed.   nitroGLYCERIN 0.4 MG SL tablet Commonly known as: NITROSTAT Place 1 tablet (0.4 mg total) under the tongue every 5 (five) minutes x 3 doses as needed for chest pain. If pain persist after call 911   sertraline 50 MG tablet Commonly known as: ZOLOFT TAKE 1 TABLET BY MOUTH DAILY   spironolactone 25 MG tablet Commonly known as: ALDACTONE Take 0.5 tablets (12.5 mg total) by mouth daily.   valsartan 320 MG tablet Commonly known as: DIOVAN TAKE 1 TABLET BY MOUTH DAILY        Follow-up Information     [facility] Follow up.   Specialty: Cardiology Why: on 7/20 at 1030 for post hospital follow up Contact information: [address] [phone] [place]. Go in 11 day(s).   Specialty: Cardiology Why: Hospital follow up PLEASE bring a  current medication list to appointment FREE valet parking , Entrance C, off [employer] information: [address] [phone] [place] [phone]               Discharge Exam: Danley Danker Weights   09/22/21 1304 09/24/21 0407 09/25/21 0352  Weight: (!) 138.5 kg (!) 138.5 kg (!) 139.1 kg   BP 132/62 (BP Location: Left Wrist)   Pulse 60   Temp 98 F (36.7 C) (Oral)   Resp (!) 24   Ht '5\' 6"'$  (1.676 m)   Wt (!) 139.1 kg   SpO2 97%   BMI 49.50 kg/m   Patient with no chest pain or palpitations, no dyspnea and no urinary symptoms  Neurology awake and alert ENT with no pallor Cardiovascular with S1 and S2 present and rhythmic Respiratory with no rales or rhonchi Abdomen not  distended No lower extremity edea on the right   Condition at discharge: stable  The results of significant diagnostics from this hospitalization (including imaging, microbiology, ancillary and laboratory) are listed below for reference.   Imaging Studies: ECHOCARDIOGRAM COMPLETE  Result Date: 09/21/2021    ECHOCARDIOGRAM REPORT   Patient Name:   KENYOTTA DORFMAN Date of Exam: 09/21/2021 Medical Rec #:  [id number]           Height:       66.0 in Accession #:    [id number]          Weight:       305.0 lb Date of Birth:  29-Oct-1955            BSA:          2.393 m Patient Age:    37 years            BP:           155/75 mmHg Patient Gender: F                   HR:           98 bpm. Exam Location:  Inpatient Procedure: 2D Echo, Cardiac Doppler and Color Doppler Indications:    Atrial fibrillation  History:        Patient has prior history of Echocardiogram examinations, most                 recent 06/18/2020. CHF, CAD, Arrythmias:Bradycardia,                 Signs/Symptoms:Chest Pain; Risk Factors:Hypertension, Diabetes                 and Dyslipidemia.  Sonographer:    Ronny Flurry Sonographer#2:  Melissa Morford RDCS (AE, PE) Referring Phys: [place]  1. Technically difficult study, consider limited echo with contrast to better evaluation wall motion. Left ventricular ejection fraction, by estimation, is 30 to 35%. The left ventricle has moderately decreased function. Left ventricular endocardial border not optimally defined to evaluate regional wall motion. There is mild left ventricular hypertrophy. Left ventricular diastolic parameters are indeterminate.  2. Right ventricular systolic function is normal. The right ventricular size is mildly enlarged. Tricuspid regurgitation signal is inadequate for assessing PA pressure.  3. Right atrial size was mildly dilated.  4. The mitral valve is normal in structure. No evidence of mitral valve regurgitation.  5. The aortic valve was not  well visualized. Aortic valve regurgitation is trivial. No aortic stenosis is present.  6. The inferior vena cava is  dilated in size with >50% respiratory variability, suggesting right atrial pressure of 8 mmHg. FINDINGS  Left Ventricle: Left ventricular ejection fraction, by estimation, is 30 to 35%. The left ventricle has moderately decreased function. Left ventricular endocardial border not optimally defined to evaluate regional wall motion. The left ventricular internal cavity size was normal in size. There is mild left ventricular hypertrophy. Left ventricular diastolic parameters are indeterminate. Right Ventricle: The right ventricular size is mildly enlarged. No increase in right ventricular wall thickness. Right ventricular systolic function is normal. Tricuspid regurgitation signal is inadequate for assessing PA pressure. Left Atrium: Left atrial size was normal in size. Right Atrium: Right atrial size was mildly dilated. Pericardium: There is no evidence of pericardial effusion. Mitral Valve: The mitral valve is normal in structure. No evidence of mitral valve regurgitation. Tricuspid Valve: The tricuspid valve is normal in structure. Tricuspid valve regurgitation is trivial. Aortic Valve: The aortic valve was not well visualized. Aortic valve regurgitation is trivial. No aortic stenosis is present. Pulmonic Valve: The pulmonic valve was not well visualized. Pulmonic valve regurgitation is not visualized. Aorta: The aortic root and ascending aorta are structurally normal, with no evidence of dilitation. Venous: The inferior vena cava is dilated in size with greater than 50% respiratory variability, suggesting right atrial pressure of 8 mmHg. IAS/Shunts: The interatrial septum was not well visualized.  LEFT VENTRICLE PLAX 2D LVIDd:         5.10 cm LVIDs:         3.95 cm LV PW:         1.10 cm LV IVS:        1.15 cm LVOT diam:     2.20 cm LVOT Area:     3.80 cm  LEFT ATRIUM             Index        RIGHT  ATRIUM           Index LA diam:        4.95 cm 2.07 cm/m   RA Area:     23.70 cm LA Vol (A2C):   60.3 ml 25.20 ml/m  RA Volume:   70.50 ml  29.47 ml/m LA Vol (A4C):   93.3 ml 38.99 ml/m LA Biplane Vol: 74.8 ml 31.26 ml/m   AORTA Ao Root diam: 2.80 cm Ao Asc diam:  3.50 cm  SHUNTS Systemic Diam: 2.20 cm Oswaldo Milian MD Electronically signed by Oswaldo Milian MD Signature Date/Time: 09/21/2021/11:30:45 AM    Final    CT Angio Chest PE W and/or Wo Contrast  Result Date: 09/20/2021 CLINICAL DATA:  Pulmonary embolism suspected, unknown D-dimer. EXAM: CT ANGIOGRAPHY CHEST WITH CONTRAST TECHNIQUE: Multidetector CT imaging of the chest was performed using the standard protocol during bolus administration of intravenous contrast. Multiplanar CT image reconstructions and MIPs were obtained to evaluate the vascular anatomy. RADIATION DOSE REDUCTION: This exam was performed according to the departmental dose-optimization program which includes automated exposure control, adjustment of the mA and/or kV according to patient size and/or use of iterative reconstruction technique. CONTRAST:  144m OMNIPAQUE IOHEXOL 350 MG/ML SOLN COMPARISON:  None Available. FINDINGS: Cardiovascular: The heart is enlarged and there is no pericardial effusion. Three-vessel coronary artery calcification is noted. There is atherosclerotic calcification of the aorta without evidence of aneurysm. The pulmonary trunk is normal in caliber. No pulmonary artery filling defect is identified. Mediastinum/Nodes: Prominent lymph nodes are noted in the periaortic space adjacent to the mid to distal descending aorta on  the left measuring up to 1 cm. Nonspecific prominent lymph nodes are present in the hilar regions bilaterally. No axillary lymphadenopathy. Scattered coarse calcifications are noted in the thyroid gland. The trachea and esophagus are within normal limits. Lungs/Pleura: Hazy ground-glass attenuation is noted in the lungs  bilaterally. Mild atelectasis is noted at the lung bases. No effusion or pneumothorax. There is a nodule in the right lower lobe measuring 7 mm, axial image 81. Upper Abdomen: There is reflux of contrast into the inferior vena cava and hepatic veins which may be associated with right heart failure. The gallbladder is surgically absent. A cyst is noted in the right upper quadrant measuring 8.9 cm, likely renal in origin. Musculoskeletal: Sternotomy wires are noted over the midline. Degenerative changes are present in the thoracic spine. No acute or suspicious osseous abnormality. Review of the MIP images confirms the above findings. IMPRESSION: 1. No evidence of pulmonary embolism. 2. Hazy ground-glass attenuation in the lungs bilaterally, possible edema or infiltrate. 3. 7 mm right lower lobe pulmonary nodule. Non-contrast chest CT at 6-12 months is recommended. If the nodule is stable at time of repeat CT, then future CT at 18-24 months (from today's scan) is considered optional for low-risk patients, but is recommended for high-risk patients. This recommendation follows the consensus statement: Guidelines for Management of Incidental Pulmonary Nodules Detected on CT Images: From the Fleischner Society 2017; Radiology 2017; 284:228-243. 4. Cardiomegaly with coronary artery calcifications. 5. Aortic atherosclerosis. Electronically Signed   By: Brett Fairy M.D.   On: 09/20/2021 23:15   DG Chest 2 View  Result Date: 09/20/2021 CLINICAL DATA:  Shortness of breath.  Chest tightness. EXAM: CHEST - 2 VIEW COMPARISON:  None Available. FINDINGS: The cardio pericardial silhouette is enlarged. There is pulmonary vascular congestion without overt pulmonary edema. Interstitial markings are diffusely coarsened with chronic features. Streaky opacity at the bases suggest atelectasis or scarring. No focal consolidation or overt pleural effusion. IMPRESSION: Enlargement of the cardiopericardial silhouette with pulmonary  vascular congestion. Electronically Signed   By: Misty Stanley M.D.   On: 09/20/2021 14:30    Microbiology: Results for orders placed or performed during the hospital encounter of 09/20/21  Urine Culture     Status: Abnormal   Collection Time: 09/20/21 10:59 PM   Specimen: Urine, Clean Catch  Result Value Ref Range Status   Specimen Description URINE, CLEAN CATCH  Final   Special Requests   Final    NONE Performed at [facility], [place] [address] [place], [place] [address]    Culture >=100,000 COLONIES/mL ESCHERICHIA COLI (A)  Final   Report Status 09/23/2021 FINAL  Final   Organism ID, Bacteria ESCHERICHIA COLI (A)  Final      Susceptibility   Escherichia coli - MIC*    AMPICILLIN 8 SENSITIVE Sensitive     CEFAZOLIN <=4 SENSITIVE Sensitive     CEFEPIME <=0.12 SENSITIVE Sensitive     CEFTRIAXONE <=0.25 SENSITIVE Sensitive     CIPROFLOXACIN <=0.25 SENSITIVE Sensitive     GENTAMICIN <=1 SENSITIVE Sensitive     IMIPENEM <=0.25 SENSITIVE Sensitive     NITROFURANTOIN <=16 SENSITIVE Sensitive     TRIMETH/SULFA <=20 SENSITIVE Sensitive     AMPICILLIN/SULBACTAM 4 SENSITIVE Sensitive     PIP/TAZO <=4 SENSITIVE Sensitive     * >=100,000 COLONIES/mL ESCHERICHIA COLI  SARS Coronavirus 2 by RT PCR (hospital order, performed in [place] hospital lab) *cepheid single result test* Anterior Nasal Swab     Status: None  Collection Time: 09/21/21 12:10 AM   Specimen: Anterior Nasal Swab  Result Value Ref Range Status   SARS Coronavirus 2 by RT PCR NEGATIVE NEGATIVE Final    Comment: (NOTE) SARS-CoV-2 target nucleic acids are NOT DETECTED.  The SARS-CoV-2 RNA is generally detectable in upper and lower respiratory specimens during the acute phase of infection. The lowest concentration of SARS-CoV-2 viral copies this assay can detect is 250 copies / mL. A negative result does not preclude SARS-CoV-2 infection and should not be used as the sole basis for treatment or other patient  management decisions.  A negative result may occur with improper specimen collection / handling, submission of specimen other than nasopharyngeal swab, presence of viral mutation(s) within the areas targeted by this assay, and inadequate number of viral copies (<250 copies / mL). A negative result must be combined with clinical observations, patient history, and epidemiological information.  Fact Sheet for Patients:   https://www.patel.info/  Fact Sheet for Healthcare Providers: https://hall.com/  This test is not yet approved or  cleared by the [place] FDA and has been authorized for detection and/or diagnosis of SARS-CoV-2 by FDA under an Emergency Use Authorization (EUA).  This EUA will remain in effect (meaning this test can be used) for the duration of the COVID-19 declaration under Section 564(b)(1) of the Act, 21 U.S.C. section 360bbb-3(b)(1), unless the authorization is terminated or revoked sooner.  Performed at [facility], [place] [address] [place], [place] [address]   Surgical pcr screen     Status: Abnormal   Collection Time: 09/21/21  7:32 PM   Specimen: Nasal Mucosa; Nasal Swab  Result Value Ref Range Status   MRSA, PCR POSITIVE (A) NEGATIVE Final    Comment: RESULT CALLED TO, READ BACK BY AND VERIFIED WITH: [facility] RN ON 09/21/21 @ 2125 BY DRT    Staphylococcus aureus POSITIVE (A) NEGATIVE Final    Comment: (NOTE) The Xpert SA Assay (FDA approved for NASAL specimens in patients 87 years of age and older), is one component of a comprehensive surveillance program. It is not intended to diagnose infection nor to guide or monitor treatment. Performed at [facility], [place] [address] [place], [place] [address]     Labs: CBC: Recent Labs  Lab 09/20/21 1355 09/23/21 0417 09/23/21 1834 09/25/21 0109  WBC 14.8* 15.5* 17.8* 16.5*  NEUTROABS  --   --  2.1 7.2  HGB 14.1 13.1 13.7 12.9  HCT 45.7  41.3 43.4 40.7  MCV 88.9 89.0 87.9 88.1  PLT 282 243 311 073   Basic Metabolic Panel: Recent Labs  Lab 09/20/21 1355 09/21/21 0042 09/21/21 1217 09/22/21 0051 09/23/21 0417 09/24/21 0242 09/25/21 0109  NA  --  140  --  140 139 137 138  K  --  3.6  --  4.3 4.3 4.5 4.6  CL  --  101  --  106 102 101 103  CO2  --  23  --  '23 25 31 28  '$ GLUCOSE  --  151*  --  115* 100* 95 94  BUN  --  17  --  25* 26* 26* 26*  CREATININE  --  1.03*  --  1.21* 1.05* 1.24* 1.12*  CALCIUM  --  8.9  --  8.6* 8.4* 8.5* 9.0  MG 2.0  --  2.0  --   --  2.2  --    Liver Function Tests: Recent Labs  Lab 09/21/21 0042  AST 20  ALT  16  ALKPHOS 41  BILITOT 0.8  PROT 6.7  ALBUMIN 3.5   CBG: No results for input(s): "GLUCAP" in the last 168 hours.  Discharge time spent: greater than 30 minutes.  Signed: Tawni Millers, MD [facility] 09/25/2021

## 2021-10-01 ENCOUNTER — Ambulatory Visit (HOSPITAL_COMMUNITY)
Admit: 2021-10-01 | Discharge: 2021-10-01 | Disposition: A | Discharge status: Home / Self Care | Payer: Medicare Other | Source: Ambulatory Visit | Attending: Nurse Practitioner | Admitting: Nurse Practitioner

## 2021-10-01 ENCOUNTER — Encounter (HOSPITAL_COMMUNITY): Payer: Self-pay | Admitting: Nurse Practitioner

## 2021-10-01 VITALS — BP 142/78 | HR 59 | Ht 66.0 in | Wt 309.0 lb

## 2021-10-01 DIAGNOSIS — I4819 Other persistent atrial fibrillation: Secondary | ICD-10-CM

## 2021-10-01 DIAGNOSIS — Z79899 Other long term (current) drug therapy: Secondary | ICD-10-CM | POA: Diagnosis not present

## 2021-10-01 DIAGNOSIS — Z7901 Long term (current) use of anticoagulants: Secondary | ICD-10-CM | POA: Insufficient documentation

## 2021-10-01 DIAGNOSIS — I2510 Atherosclerotic heart disease of native coronary artery without angina pectoris: Secondary | ICD-10-CM | POA: Diagnosis not present

## 2021-10-01 DIAGNOSIS — Z951 Presence of aortocoronary bypass graft: Secondary | ICD-10-CM | POA: Diagnosis not present

## 2021-10-01 DIAGNOSIS — I110 Hypertensive heart disease with heart failure: Secondary | ICD-10-CM | POA: Insufficient documentation

## 2021-10-01 DIAGNOSIS — I509 Heart failure, unspecified: Secondary | ICD-10-CM | POA: Diagnosis not present

## 2021-10-01 DIAGNOSIS — D6869 Other thrombophilia: Secondary | ICD-10-CM | POA: Diagnosis not present

## 2021-10-01 DIAGNOSIS — I4891 Unspecified atrial fibrillation: Secondary | ICD-10-CM | POA: Insufficient documentation

## 2021-10-01 NOTE — Progress Notes (Addendum)
Primary Care Physician: [name], MD Referring Physician: hospital f/u Cardiologist: Dr. Percival Spanish  EP: Dr. [name] is a 66 y.o. female with a h/o afib on Tikosyn, CAD s/p CABG,  HTN, CAD, HF, morbid obesity that was admitted 7/10 to 7/14 at [facility] for afib with RVR with associated HF symptoms. She was cardioverted and continued anticoagulation with eliquis and received lasix for volume overload. Echo with EF of 30 to 35%, thought to be possible reduction in LV dysfunction from RVR. + urine culture for e. Coli with no symptoms and given 3 day treatment of cephalexin. She was scheduled for f/u in the afib clinic.   Today, she is in in [place] with PVC's. Her fluid weight is stable. She took one lasix for good measure after d/c but weighing daily and has not required any further lasix. She had not been daily weighing, tracking sodium or fluid intake prior to hospital  but is doing so now. She cannot tell when she is in afib. She had f/u with HF transition of care clinic 7/24.    Today, she denies symptoms of palpitations, chest pain, shortness of breath, orthopnea, PND, lower extremity edema, dizziness, presyncope, syncope, or neurologic sequela. The patient is tolerating medications without difficulties and is otherwise without complaint today.   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    S/p CABG x 2 in 2003   Cancer [facility])    basal cell face   Complication of anesthesia    states low O2 sats post-op 11/13, always slow to awaken   Degenerative joint disease    Dehiscence of amputation stump ([place])    left BKA   Depression    Fatty liver    HTN (hypertension)    Hyperlipidemia LDL goal <70    Incarcerated hernia    Internal and external hemorrhoids without complication    Lymphedema of arm    right   Morbid obesity (HCC)    Rheumatoid arthritis ([place])    Surgical wound, non healing ABDOMINAL    has wound vac @ 125 mm Hg    Ventral hernia    Past Surgical History:  Procedure Laterality Date   AMPUTATION Left 05/06/2017   Procedure: BELOW KNEE AMPUTATION;  Surgeon: Newt Minion, MD;  Location: [place];  Service: Orthopedics;  Laterality: Left;   APPLICATION OF WOUND VAC     BOWEL RESECTION  02/07/2012   Procedure: SMALL BOWEL RESECTION;  Surgeon: Gayland Curry, MD,FACS;  Location: [place];  Service: General;;   CARDIOVASCULAR STRESS TEST  01-17-2012  DR HOCHREIN   LOW RISK NUCLEAR TEST   CARDIOVERSION N/A 07/14/2020   Procedure: CARDIOVERSION;  Surgeon: Arnoldo Lenis, MD;  Location: [facility];  Service: Endoscopy;  Laterality: N/A;   CARDIOVERSION N/A 09/22/2021   Procedure: CARDIOVERSION;  Surgeon: Jerline Pain, MD;  Location: [facility];  Service: Cardiovascular;  Laterality: N/A;   CESAREAN SECTION     x 4 in remote past   [place]  2003   by Dr. Servando Snare. LIMA to the LAD, free RIMA to the circumflex. Stress perfusion study December 2009 with no high-risk areas of ischemia. She has a well-preserved ejection fraction   HYSTEROSCOPY WITH D & C N/A 05/14/2014   Procedure: DILATATION AND CURETTAGE (no specimen); HYSTEROSCOPY;  Surgeon: Jonnie Kind, MD;  Location: [facility];  Service:  Gynecology;  Laterality: N/A;   I & D EXTREMITY Left 06/21/2017   Procedure: IRRIGATION AND DEBRIDEMENT OF LEFT LEG AMPUTATION SITE;  Surgeon: Wallace Going, DO;  Location: [facility];  Service: Plastics;  Laterality: Left;   INCISION AND DRAINAGE OF WOUND  04/17/2012   Procedure: IRRIGATION AND DEBRIDEMENT WOUND;  Surgeon: Theodoro Kos, DO;  Location: [place];  Service: Plastics;  Laterality: N/A;  OF ABDOMINAL WOUND, SURGICAL PREP AND PLACEMENT OF VAC, REMOVAL FOREHEAD SKIN LESION   INCISION AND DRAINAGE OF WOUND N/A 04/24/2012   Procedure: IRRIGATION AND DEBRIDEMENT WOUND;  Surgeon: Theodoro Kos, DO;  Location: [place];  Service: Plastics;  Laterality:  N/A;  I & D ABDOMINAL WOUND WITH VAC AND ACELL   INCISION AND DRAINAGE OF WOUND N/A 05/01/2012   Procedure: IRRIGATION AND DEBRIDEMENT WOUND;  Surgeon: Theodoro Kos, DO;  Location: [place];  Service: Plastics;  Laterality: N/A;  WITH SURGICAL PREP AND PLACEMENT OF VAC   INCISION AND DRAINAGE OF WOUND N/A 05/08/2012   Procedure: IRRIGATION AND DEBRIDEMENT OF ABD WOUND SURGICAL PREP AND PLACEMENT OF VAC ;  Surgeon: Theodoro Kos, DO;  Location: [place];  Service: Plastics;  Laterality: N/A;  IRRIGATION AND DEBRIDEMENT OF ABD WOUND SURGICAL PREP AND PLACEMENT OF VAC    INCISION AND DRAINAGE OF WOUND N/A 05/15/2012   Procedure: IRRIGATION AND DEBRIDEMENT OF ABDOMINAL WOUND WITH POSSIBLE SURGICAL PREP AND PLACEMENT OF VAC;  Surgeon: Theodoro Kos, DO;  Location: [place];  Service: Plastics;  Laterality: N/A;   INCISION AND DRAINAGE OF WOUND N/A 05/22/2012   Procedure: IRRIGATION AND DEBRIDEMENT OF ABODOMINAL WOUND WITH  SURGICAL PREP AND VAC PLACEMENT;  Surgeon: Theodoro Kos, DO;  Location: [place];  Service: Plastics;  Laterality: N/A;   INCISION AND DRAINAGE OF WOUND N/A 06/28/2012   Procedure: IRRIGATION AND DEBRIDEMENT OF ABDOMINAL ULCER SURGICAL PREP AND PLACEMENT OF ACELL AND VAC;  Surgeon: Theodoro Kos, DO;  Location: [place];  Service: Plastics;  Laterality: N/A;   INCISIONAL HERNIA REPAIR  02/07/2012   Procedure: HERNIA REPAIR INCISIONAL;  Surgeon: Gayland Curry, MD,FACS;  Location: [place];  Service: General;;  Open, Primary repair, strangulated Incisional hernia.   LESION REMOVAL  04/17/2012   Procedure: LESION REMOVAL;  Surgeon: Theodoro Kos, DO;  Location: [place];  Service: Plastics;  Laterality: N/A;   CENTER OF FOREHEAD, REMOVAL FORHEAD SKIN LESION   ORIF TOE FRACTURE Left 02/09/2017   Procedure: EXCISION TALAR HEAD, INTERNAL FIXATION MEDIAL COLUMN LEFT FOOT;  Surgeon: Newt Minion, MD;  Location: [place];  Service: Orthopedics;  Laterality: Left;   POLYPECTOMY N/A 05/14/2014   Procedure: ENDOMETRIAL POLYPECTOMY;  Surgeon: Jonnie Kind, MD;  Location: [facility];  Service: Gynecology;  Laterality: N/A;   STUMP REVISION Left 08/31/2017   Procedure: REVISION LEFT BELOW KNEE AMPUTATION;  Surgeon: Newt Minion, MD;  Location: [place];  Service: Orthopedics;  Laterality: Left;    Current Outpatient Medications  Medication Sig Dispense Refill   acetaminophen (TYLENOL) 325 MG tablet Take 650 mg by mouth 2 (two) times daily as needed (back ache).     apixaban (ELIQUIS) 5 MG TABS tablet TAKE 1 TABLET BY MOUTH TWICE  DAILY (Patient taking differently: Take 5 mg by mouth 2 (two) times daily.) 180 tablet 3   atorvastatin (LIPITOR) 20 MG tablet TAKE 1 TABLET BY MOUTH DAILY (Patient taking differently: Take 20 mg  by mouth daily.) 90 tablet 0   BELSOMRA 20 MG TABS Take 1 tablet by mouth at bedtime as needed. (Patient taking differently: Take 20 mg by mouth at bedtime.) 90 tablet 1   carvedilol (COREG) 3.125 MG tablet Take 1 tablet (3.125 mg total) by mouth 2 (two) times daily with a meal. 60 tablet 0   dapagliflozin propanediol (FARXIGA) 10 MG TABS tablet Take 1 tablet (10 mg total) by mouth daily. 30 tablet 0   dofetilide (TIKOSYN) 125 MCG capsule Take 1 capsule (125 mcg total) by mouth 2 (two) times daily. 180 capsule 1   fenofibrate 160 MG tablet TAKE 1 TABLET BY MOUTH DAILY (Patient taking differently: Take 160 mg by mouth daily.) 90 tablet 0   fexofenadine (ALLEGRA) 180 MG tablet Take 1 tablet (180 mg total) by mouth daily. For allergy symptoms (Patient not taking: Reported on 09/21/2021) 90 tablet 3   fluticasone (FLONASE) 50 MCG/ACT nasal spray Place 1 spray into both nostrils daily as needed for allergies or rhinitis.     furosemide (LASIX) 20 MG tablet Take 1 tablet (20 mg total) by mouth daily as needed. 30 tablet 6   nitroGLYCERIN (NITROSTAT) 0.4 MG SL tablet Place 1 tablet (0.4 mg  total) under the tongue every 5 (five) minutes x 3 doses as needed for chest pain. If pain persist after call 911 25 tablet 6   sertraline (ZOLOFT) 50 MG tablet TAKE 1 TABLET BY MOUTH DAILY (Patient taking differently: Take 50 mg by mouth daily.) 90 tablet 0   spironolactone (ALDACTONE) 25 MG tablet Take 0.5 tablets (12.5 mg total) by mouth daily. 15 tablet 0   valsartan (DIOVAN) 320 MG tablet TAKE 1 TABLET BY MOUTH DAILY (Patient taking differently: Take 320 mg by mouth daily.) 90 tablet 1   No current facility-administered medications for this encounter.    Allergies  Allergen Reactions   Ace Inhibitors Cough    Social History   Socioeconomic History   Marital status: Married    Spouse name: Eddie Dibbles   Number of children: 5   Years of education: Not on file   Highest education level: GED or equivalent  Occupational History   Occupation: Estate manager/land agent    Comment: In the [employer]   Occupation: retired  Tobacco Use   Smoking status: Former    Packs/day: 1.00    Years: 25.00    Total pack years: 25.00    Types: Cigarettes    Quit date: 11/17/2011    Years since quitting: 9.8    Passive exposure: Current   Smokeless tobacco: Never  Vaping Use   Vaping Use: Never used  Substance and Sexual Activity   Alcohol use: No   Drug use: No   Sexual activity: Yes    Birth control/protection: Post-menopausal  Other Topics Concern   Not on file  Social History Narrative   Lives in [place] with her husband. 5 children. One of whom has pulmonary stenosis.    Left BKA, uses crutches - doesn't get much exercise   Social Determinants of Health   Financial Resource Strain: Low Risk  (09/22/2021)   Overall Financial Resource Strain (CARDIA)    Difficulty of Paying Living Expenses: Not very hard  Food Insecurity: No Food Insecurity (09/22/2021)   Hunger Vital Sign    Worried About Running Out of Food in the Last Year: Never true    Ran Out of Food in the Last Year: Never true   Transportation Needs: No Transportation Needs (09/22/2021)  PRAPARE - Hydrologist (Medical): No    Lack of Transportation (Non-Medical): No  Physical Activity: Insufficiently Active (10/01/2020)   Exercise Vital Sign    Days of Exercise per Week: 7 days    Minutes of Exercise per Session: 10 min  Stress: No Stress Concern Present (10/01/2020)   [place]    Feeling of Stress : Not at all  Social Connections: Moderately Isolated (10/01/2020)   Social Connection and Isolation Panel [NHANES]    Frequency of Communication with Friends and Family: More than three times a week    Frequency of Social Gatherings with Friends and Family: Twice a week    Attends Religious Services: Never    Marine scientist or Organizations: No    Attends Archivist Meetings: Never    Marital Status: Married  Human resources officer Violence: Not At Risk (10/01/2020)   Humiliation, Afraid, Rape, and Kick questionnaire    Fear of Current or Ex-Partner: No    Emotionally Abused: No    Physically Abused: No    Sexually Abused: No    Family History  Problem Relation Age of Onset   Heart attack Mother    Hypertension Mother    Heart disease Mother    Sudden death Mother    Obesity Mother    Hypertension Sister    Diabetes Brother    Birth defects Son        heart defect    ROS- All systems are reviewed and negative except as per the HPI above  Physical Exam: There were no vitals filed for this visit. Wt Readings from Last 3 Encounters:  09/25/21 (!) 139.1 kg  05/13/21 (!) 138.3 kg  12/17/20 130.2 kg    Labs: Lab Results  Component Value Date   NA 138 09/25/2021   K 4.6 09/25/2021   CL 103 09/25/2021   CO2 28 09/25/2021   GLUCOSE 94 09/25/2021   BUN 26 (H) 09/25/2021   CREATININE 1.12 (H) 09/25/2021   CALCIUM 9.0 09/25/2021   MG 2.2 09/24/2021   Lab Results  Component Value Date    INR 1.3 (H) 09/22/2021   Lab Results  Component Value Date   CHOL 144 08/19/2020   HDL 41 08/19/2020   LDLCALC 83 08/19/2020   TRIG 107 08/19/2020     GEN- The patient is well appearing, alert and oriented x 3 today.   Head- normocephalic, atraumatic Eyes-  Sclera clear, conjunctiva pink Ears- hearing intact Oropharynx- clear Neck- supple, no JVP Lymph- no cervical lymphadenopathy Lungs- Clear to ausculation bilaterally, normal work of breathing Heart-Irregular rate(Pvc's) and rhythm, no murmurs, rubs or gallops, PMI not laterally displaced GI- soft, NT, ND, + BS Extremities- no clubbing, cyanosis, or edema MS- no significant deformity or atrophy Skin- no rash or lesion Psych- euthymic mood, full affect Neuro- strength and sensation are intact  EKG-Vent. rate 59 BPM PR interval 174 ms QRS duration 94 ms QT/QTcB 448/443 ms P-R-T axes 46 -31 73 Sinus bradycardia with frequent Premature ventricular complexes Left axis deviation Minimal voltage criteria for LVH, may be normal variant ( Cornell product ) Septal infarct , age undetermined Abnormal ECG When compared with ECG of 22-Sep-2021 15:57, PREVIOUS ECG IS PRESENT    Assessment and Plan:  1. Afib In [place]  Triggers discussed  Continue Tikosyn 125 mcg bid  Continue carvedilol 3.125 mg bid   2. Acute on chronic heart failure Resolved  Normovolemic today  Weigh  daily Avoid salt, limit fluids  Lasix as needed  Spironolactone 12.5 mg daily   3, HTN Stable  Continue   4. CAD No anginal symptoms   5. CHA2DS2VASc  score of at least 5 States compliance with eliquis 5 mg bid   Has f/u with cardiology NP tomorrow and HF transition clinic on Monday, Dr. Curt Bears 8/25 and Dr. Alba Cory 8/30 She wants to eliminate the appointment for tomorrow, I asked her to  call and ask   Afib clinic as needed for Tikosyn surveillance going forward   Julia West, [facility] [address] [place], [place] [address] [phone]

## 2021-10-02 ENCOUNTER — Ambulatory Visit: Payer: Medicare Other

## 2021-10-02 ENCOUNTER — Ambulatory Visit (HOSPITAL_BASED_OUTPATIENT_CLINIC_OR_DEPARTMENT_OTHER): Payer: Medicare Other | Admitting: Family

## 2021-10-02 ENCOUNTER — Encounter (HOSPITAL_BASED_OUTPATIENT_CLINIC_OR_DEPARTMENT_OTHER): Payer: Self-pay

## 2021-10-04 NOTE — Progress Notes (Signed)
HEART & VASCULAR TRANSITION OF CARE CONSULT NOTE     Referring Physician: Dr Avelino Leeds Primary Care: Dr Livia Snellen  Primary Cardiologist: Dr Percival Spanish  EP: Dr Curt Bears   HPI: Referred to clinic by Dr Cathlean Sauer for heart failure consultation.   Julia West is a 66 year old with a history of PAF,  HTN, CAD, CABG  2003,hyperlipidemia, LBKA, lymphedema RLE/RUE, and chronic HFrEF.   Admitted with A fib RVR and A/C HFrEF. Diuresed with IV lasix. Echo showed newly reduced EF 30-35%. Had DC-CV 09/22/21 with restoration of SR. Plan to f/u with Dr Myrtie Neither for ablation. Started on SGLT2i and MRA. Discharged on BB and ARB. Discharge weight 305 pounds.   Had f/u in the [facility]. Maintaining SR on Tikosyn.  Overall feeling fine.  Says she is unaware when she goes in A fib. Ambulates with crutches. Has LBKA and has been afraid she will fall. Mild SOB with exertion. Denies PND/Orthopnea. Uses crutches to ambulate. Appetite ok. No fever or chills. Weight at home trending down from 315--->305 pounds. Taking all medications  Cardiac Testing  Echo 7/1023EF 30-35% RV normal  Echo 06/2020 EF 50-55%   DC-CV 09/22/21 --> SR   Review of Systems: [y] = yes, '[ ]'$  = no   General: Weight gain '[ ]'$ ; Weight loss '[ ]'$ ; Anorexia '[ ]'$ ; Fatigue '[ ]'$ ; Fever '[ ]'$ ; Chills '[ ]'$ ; Weakness [Y ]  Cardiac: Chest pain/pressure '[ ]'$ ; Resting SOB '[ ]'$ ; Exertional SOB [ Y]; Orthopnea '[ ]'$ ; Pedal Edema '[ ]'$ ; Palpitations '[ ]'$ ; Syncope '[ ]'$ ; Presyncope '[ ]'$ ; Paroxysmal nocturnal dyspnea'[ ]'$   Pulmonary: Cough '[ ]'$ ; Wheezing'[ ]'$ ; Hemoptysis'[ ]'$ ; Sputum '[ ]'$ ; Snoring '[ ]'$   GI: Vomiting'[ ]'$ ; Dysphagia'[ ]'$ ; Melena'[ ]'$ ; Hematochezia '[ ]'$ ; Heartburn'[ ]'$ ; Abdominal pain '[ ]'$ ; Constipation '[ ]'$ ; Diarrhea '[ ]'$ ; BRBPR '[ ]'$   GU: Hematuria'[ ]'$ ; Dysuria '[ ]'$ ; Nocturia'[ ]'$   Vascular: Pain in legs with walking '[ ]'$ ; Pain in feet with lying flat '[ ]'$ ; Non-healing sores '[ ]'$ ; Stroke '[ ]'$ ; TIA '[ ]'$ ; Slurred speech '[ ]'$ ;  Neuro: Headaches'[ ]'$ ; Vertigo'[ ]'$ ; Seizures'[ ]'$ ; Paresthesias'[ ]'$ ;Blurred  vision '[ ]'$ ; Diplopia '[ ]'$ ; Vision changes '[ ]'$   Ortho/Skin: Arthritis '[ ]'$ ; Joint pain [Y ]; Muscle pain '[ ]'$ ; Joint swelling '[ ]'$ ; Back Pain [ Y]; Rash '[ ]'$   Psych: Depression'[ ]'$ ; Anxiety'[ ]'$   Heme: Bleeding problems '[ ]'$ ; Clotting disorders '[ ]'$ ; Anemia '[ ]'$   Endocrine: Diabetes '[ ]'$ ; Thyroid dysfunction'[ ]'$    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    S/p CABG x 2 in 2003   Cancer [facility])    basal cell face   Complication of anesthesia    states low O2 sats post-op 11/13, always slow to awaken   Degenerative joint disease    Dehiscence of amputation stump ([place])    left BKA   Depression    Fatty liver    HTN (hypertension)    Hyperlipidemia LDL goal <70    Incarcerated hernia    Internal and external hemorrhoids without complication    Lymphedema of arm    right   Morbid obesity (HCC)    Rheumatoid arthritis ([place])    Surgical wound, non healing ABDOMINAL    has wound vac @ 125 mm Hg   Ventral hernia     Current Outpatient Medications  Medication Sig Dispense Refill   acetaminophen (TYLENOL) 325  MG tablet Take 650 mg by mouth 2 (two) times daily as needed (back ache).     apixaban (ELIQUIS) 5 MG TABS tablet TAKE 1 TABLET BY MOUTH TWICE  DAILY 180 tablet 3   atorvastatin (LIPITOR) 20 MG tablet TAKE 1 TABLET BY MOUTH DAILY 90 tablet 0   BELSOMRA 20 MG TABS Take 1 tablet by mouth at bedtime as needed. (Patient taking differently: Take 20 mg by mouth at bedtime.) 90 tablet 1   carvedilol (COREG) 3.125 MG tablet Take 1 tablet (3.125 mg total) by mouth 2 (two) times daily with a meal. 60 tablet 0   dapagliflozin propanediol (FARXIGA) 10 MG TABS tablet Take 1 tablet (10 mg total) by mouth daily. 30 tablet 0   dofetilide (TIKOSYN) 125 MCG capsule Take 1 capsule (125 mcg total) by mouth 2 (two) times daily. 180 capsule 1   fenofibrate 160 MG tablet TAKE 1 TABLET BY MOUTH DAILY (Patient taking differently: Take 160 mg by mouth daily.) 90 tablet 0    fexofenadine (ALLEGRA) 180 MG tablet Take 1 tablet (180 mg total) by mouth daily. For allergy symptoms 90 tablet 3   fluticasone (FLONASE) 50 MCG/ACT nasal spray Place 1 spray into both nostrils daily as needed for allergies or rhinitis.     furosemide (LASIX) 20 MG tablet Take 1 tablet (20 mg total) by mouth daily as needed. 30 tablet 6   nitroGLYCERIN (NITROSTAT) 0.4 MG SL tablet Place 1 tablet (0.4 mg total) under the tongue every 5 (five) minutes x 3 doses as needed for chest pain. If pain persist after call 911 25 tablet 6   sertraline (ZOLOFT) 50 MG tablet TAKE 1 TABLET BY MOUTH DAILY 90 tablet 0   spironolactone (ALDACTONE) 25 MG tablet Take 0.5 tablets (12.5 mg total) by mouth daily. 15 tablet 0   valsartan (DIOVAN) 320 MG tablet TAKE 1 TABLET BY MOUTH DAILY 90 tablet 1   No current facility-administered medications for this encounter.    Allergies  Allergen Reactions   Ace Inhibitors Cough      Social History   Socioeconomic History   Marital status: Married    Spouse name: Julia West   Number of children: 5   Years of education: Not on file   Highest education level: GED or equivalent  Occupational History   Occupation: Estate manager/land agent    Comment: In the [employer]   Occupation: retired  Tobacco Use   Smoking status: Former    Packs/day: 1.00    Years: 25.00    Total pack years: 25.00    Types: Cigarettes    Quit date: 11/17/2011    Years since quitting: 9.8    Passive exposure: Current   Smokeless tobacco: Never  Vaping Use   Vaping Use: Never used  Substance and Sexual Activity   Alcohol use: No   Drug use: No   Sexual activity: Yes    Birth control/protection: Post-menopausal  Other Topics Concern   Not on file  Social History Narrative   Lives in [place] with her husband. 5 children. One of whom has pulmonary stenosis.    Left BKA, uses crutches - doesn't get much exercise   Social Determinants of Health   Financial Resource Strain: Low Risk  (09/22/2021)    Overall Financial Resource Strain (CARDIA)    Difficulty of Paying Living Expenses: Not very hard  Food Insecurity: No Food Insecurity (09/22/2021)   Hunger Vital Sign    Worried About Running Out of Food in the Last  Year: Never true    [place] in the Last Year: Never true  Transportation Needs: No Transportation Needs (09/22/2021)   PRAPARE - Hydrologist (Medical): No    Lack of Transportation (Non-Medical): No  Physical Activity: Insufficiently Active (10/01/2020)   Exercise Vital Sign    Days of Exercise per Week: 7 days    Minutes of Exercise per Session: 10 min  Stress: No Stress Concern Present (10/01/2020)   [place]    Feeling of Stress : Not at all  Social Connections: Moderately Isolated (10/01/2020)   Social Connection and Isolation Panel [NHANES]    Frequency of Communication with Friends and Family: More than three times a week    Frequency of Social Gatherings with Friends and Family: Twice a week    Attends Religious Services: Never    Marine scientist or Organizations: No    Attends Archivist Meetings: Never    Marital Status: Married  Human resources officer Violence: Not At Risk (10/01/2020)   Humiliation, Afraid, Rape, and Kick questionnaire    Fear of Current or Ex-Partner: No    Emotionally Abused: No    Physically Abused: No    Sexually Abused: No      Family History  Problem Relation Age of Onset   Heart attack Mother    Hypertension Mother    Heart disease Mother    Sudden death Mother    Obesity Mother    Hypertension Sister    Diabetes Brother    Birth defects Son        heart defect    Vitals:   10/05/21 0956  BP: 90/60  Pulse: 65  SpO2: 92%  Weight: (!) 139.1 kg (306 lb 9.6 oz)   Wt Readings from Last 3 Encounters:  10/05/21 (!) 139.1 kg (306 lb 9.6 oz)  10/01/21 (!) 140.2 kg (309 lb)  09/25/21 (!) 139.1 kg (306 lb 10.6  oz)     PHYSICAL EXAM: General:  Well appearing. No respiratory difficulty HEENT: normal Neck: supple. no JVD. Carotids 2+ bilat; no bruits. No lymphadenopathy or thryomegaly appreciated. Cor: PMI nondisplaced. Regular rate & rhythm. No rubs, gallops or murmurs. Lungs: clear Abdomen: soft, nontender, nondistended. No hepatosplenomegaly. No bruits or masses. Good bowel sounds. Extremities: no cyanosis, clubbing, rash, edema. LBKA with prosthetic on.  Neuro: alert & oriented x 3, cranial nerves grossly intact. moves all 4 extremities w/o difficulty. Affect pleasant.  ECG: SR 65 bpm multifocal PVCs  QRS 92 Julia QTc465   ASSESSMENT & PLAN: 1. Chronic HFrEF Echo 09/2021 down to 30-35% from previous 50-55% back 2022.  It is possible this is tachy mediated  but could also be PVCs induced. Will need repeat ECHO in 3 months. If EF remains low will need to consider cath. No recent cath. Had CABG in 2003. No chest pain.  NYHA III.  GDMT  Diuretic-Volume status stable. Continue lasix as needed.  BB- Continue carvedilol 3.125 mg twice a day  Ace/ARB/ARNI- On Valsartan. Consider entresto next visit.  MRA- Continue spiro 25 mg daily SGLT2i- Continue farxiga 10 mg daily - Check UA Check BMET, BNP   2.PAF Followed in the A fib clinic In [place] today  ON Tikosyn Has f/u with Dr Curt Bears for possible ablation  Check CBC   3. Obesity  Body mass index is 49.49 kg/m. Discussed portion control   4. PVCs  Frequent PVCs on  EKG. Multifocal on EKG Place ZIO for 14 days to quantify Has f/u with Dr Baird Kay. ? Ablation.    5. Snores Suspected OSA. Set up home sleep study.  6. CAD CABG 2003 No chest pain. On statin, bb, SGLT2i, and eliqius.   7. Dysuria Check UA   Referred to HFSW (PCP, Medications, Transportation, ETOH Abuse, Drug Abuse, Insurance, Financial ): No Refer to Pharmacy: Yes  Refer to Home Health:  No Refer to Advanced Heart Failure Clinic: Yes Refer to General Cardiology: Shared   with Dr Warren Lacy   Follow up 3 weeks pharmacy and 12 weeks with Dr Aundra Dubin and an ECHO.   Avy Barlett NP-C  11:15 AM

## 2021-10-05 ENCOUNTER — Encounter (INDEPENDENT_AMBULATORY_CARE_PROVIDER_SITE_OTHER): Payer: Medicare Other | Admitting: Cardiology

## 2021-10-05 ENCOUNTER — Ambulatory Visit: Payer: Medicare Other

## 2021-10-05 ENCOUNTER — Encounter (HOSPITAL_COMMUNITY): Payer: Self-pay

## 2021-10-05 ENCOUNTER — Ambulatory Visit (HOSPITAL_COMMUNITY)
Admit: 2021-10-05 | Discharge: 2021-10-05 | Disposition: A | Discharge status: Home / Self Care | Payer: Medicare Other | Attending: Adult Health | Admitting: Adult Health

## 2021-10-05 ENCOUNTER — Ambulatory Visit (HOSPITAL_COMMUNITY)
Admission: RE | Admit: 2021-10-05 | Discharge: 2021-10-05 | Disposition: A | Discharge status: Home / Self Care | Payer: Medicare Other | Source: Ambulatory Visit

## 2021-10-05 ENCOUNTER — Telehealth (HOSPITAL_COMMUNITY): Payer: Self-pay | Admitting: *Deleted

## 2021-10-05 VITALS — BP 90/60 | HR 65 | Wt 306.6 lb

## 2021-10-05 DIAGNOSIS — Z89512 Acquired absence of left leg below knee: Secondary | ICD-10-CM | POA: Insufficient documentation

## 2021-10-05 DIAGNOSIS — Z6841 Body Mass Index (BMI) 40.0 and over, adult: Secondary | ICD-10-CM | POA: Insufficient documentation

## 2021-10-05 DIAGNOSIS — G4734 Idiopathic sleep related nonobstructive alveolar hypoventilation: Secondary | ICD-10-CM

## 2021-10-05 DIAGNOSIS — R0683 Snoring: Secondary | ICD-10-CM | POA: Insufficient documentation

## 2021-10-05 DIAGNOSIS — R300 Dysuria: Secondary | ICD-10-CM

## 2021-10-05 DIAGNOSIS — I5022 Chronic systolic (congestive) heart failure: Secondary | ICD-10-CM | POA: Diagnosis not present

## 2021-10-05 DIAGNOSIS — I480 Paroxysmal atrial fibrillation: Secondary | ICD-10-CM | POA: Insufficient documentation

## 2021-10-05 DIAGNOSIS — I4819 Other persistent atrial fibrillation: Secondary | ICD-10-CM

## 2021-10-05 DIAGNOSIS — I493 Ventricular premature depolarization: Secondary | ICD-10-CM | POA: Diagnosis not present

## 2021-10-05 DIAGNOSIS — I110 Hypertensive heart disease with heart failure: Secondary | ICD-10-CM | POA: Insufficient documentation

## 2021-10-05 DIAGNOSIS — Z951 Presence of aortocoronary bypass graft: Secondary | ICD-10-CM | POA: Insufficient documentation

## 2021-10-05 DIAGNOSIS — G4719 Other hypersomnia: Secondary | ICD-10-CM | POA: Diagnosis not present

## 2021-10-05 DIAGNOSIS — E669 Obesity, unspecified: Secondary | ICD-10-CM | POA: Insufficient documentation

## 2021-10-05 DIAGNOSIS — Z7984 Long term (current) use of oral hypoglycemic drugs: Secondary | ICD-10-CM | POA: Diagnosis not present

## 2021-10-05 DIAGNOSIS — R0602 Shortness of breath: Secondary | ICD-10-CM | POA: Insufficient documentation

## 2021-10-05 DIAGNOSIS — Z8249 Family history of ischemic heart disease and other diseases of the circulatory system: Secondary | ICD-10-CM | POA: Insufficient documentation

## 2021-10-05 DIAGNOSIS — I2510 Atherosclerotic heart disease of native coronary artery without angina pectoris: Secondary | ICD-10-CM | POA: Diagnosis not present

## 2021-10-05 DIAGNOSIS — Z79899 Other long term (current) drug therapy: Secondary | ICD-10-CM | POA: Insufficient documentation

## 2021-10-05 DIAGNOSIS — G4733 Obstructive sleep apnea (adult) (pediatric): Secondary | ICD-10-CM

## 2021-10-05 LAB — CBC
HCT: 46.6 % — ABNORMAL HIGH (ref 36.0–46.0)
Hemoglobin: 14.3 g/dL (ref 12.0–15.0)
MCH: 27.8 pg (ref 26.0–34.0)
MCHC: 30.7 g/dL (ref 30.0–36.0)
MCV: 90.5 fL (ref 80.0–100.0)
Platelets: 312 10*3/uL (ref 150–400)
RBC: 5.15 MIL/uL — ABNORMAL HIGH (ref 3.87–5.11)
RDW: 15.9 % — ABNORMAL HIGH (ref 11.5–15.5)
WBC: 14.3 10*3/uL — ABNORMAL HIGH (ref 4.0–10.5)
nRBC: 0 % (ref 0.0–0.2)

## 2021-10-05 LAB — BASIC METABOLIC PANEL
Anion gap: 6 (ref 5–15)
BUN: 22 mg/dL (ref 8–23)
CO2: 26 mmol/L (ref 22–32)
Calcium: 9 mg/dL (ref 8.9–10.3)
Chloride: 109 mmol/L (ref 98–111)
Creatinine, Ser: 1.11 mg/dL — ABNORMAL HIGH (ref 0.44–1.00)
GFR, Estimated: 54 mL/min — ABNORMAL LOW (ref >60)
Glucose, Bld: 100 mg/dL — ABNORMAL HIGH (ref 70–99)
Potassium: 4.6 mmol/L (ref 3.5–5.1)
Sodium: 141 mmol/L (ref 135–145)

## 2021-10-05 LAB — URINALYSIS, ROUTINE W REFLEX MICROSCOPIC
Bilirubin Urine: NEGATIVE
Glucose, UA: >=500 mg/dL — AB
Hgb urine dipstick: NEGATIVE
Ketones, ur: NEGATIVE mg/dL
Nitrite: NEGATIVE
Protein, ur: NEGATIVE mg/dL
Specific Gravity, Urine: 1.023 (ref 1.005–1.030)
pH: 5 (ref 5.0–8.0)

## 2021-10-05 LAB — BRAIN NATRIURETIC PEPTIDE: B Natriuretic Peptide: 243.7 pg/mL — ABNORMAL HIGH (ref 0.0–100.0)

## 2021-10-05 MED ORDER — SPIRONOLACTONE 25 MG PO TABS: 12.5000 mg | ORAL_TABLET | Freq: Every day | ORAL | 6 refills | Status: DC

## 2021-10-05 MED ORDER — CARVEDILOL 3.125 MG PO TABS: 3.1250 mg | ORAL_TABLET | Freq: Two times a day (BID) | ORAL | 6 refills | Status: DC

## 2021-10-05 NOTE — Patient Instructions (Signed)
Medication Changes:  None, continue current medications  We are working on application for patient assistance for your Wilder Glade, we will let you know when approved, we have given you samples for now  Lab Work:  Labs done today, your results will be available in [place], we will contact you for abnormal readings.  Testing/Procedures:  Your provider has recommended that  you wear a Zio Patch for 14 days.  This monitor will record your heart rhythm for our review.  IF you have any symptoms while wearing the monitor please press the button.  If you have any issues with the patch or you notice a red or orange light on it please call the company at [phone].  Once you remove the patch please mail it back to the company as soon as possible so we can get the results.  Your provider has recommended that you have a home sleep study.  We have provided you with the equipment in our office today. Please download the app and follow the instructions. YOUR PIN NUMBER IS: [id number]. Once you have completed the test you just dispose of the equipment, the information is automatically uploaded to [place] via blue-tooth technology. If your test is positive for sleep apnea and you need a home CPAP machine you will be contacted by Dr Theodosia Blender office [facility]) to set this up.  Your physician has requested that you have an echocardiogram. Echocardiography is a painless test that uses sound waves to create images of your heart. It provides your doctor with information about the size and shape of your heart and how well your heart's chambers and valves are working. This procedure takes approximately one hour. There are no restrictions for this procedure. IN 3 MONTHS  Special Instructions // Education:  Do the following things EVERYDAY: Weigh yourself in the morning before breakfast. Write it down and keep it in a log. Take your medicines as prescribed Eat low salt foods--Limit salt (sodium) to 2000 mg per day.  Stay  as active as you can everyday Limit all fluids for the day to less than 2 liters  Your physician has requested that you regularly monitor and record your blood pressure readings at home. Please use the same machine at the same time of day to check your readings and record them to bring to your follow-up visit.   Follow-Up in:  Thank you for allowing [place] to provider your heart failure care after your recent hospitalization. Please follow-up with our Advanced Heart Failure Clinic: pharmacist in 3 weeks and Dr Aundra Dubin in 3 months with an echocardiogram. Please keep all other follow-up appointments as scheduled:  As scheduled with Dr Livia Snellen 11/04/21 As scheduled with Dr Curt Bears 11/06/21 As scheduled with Dr Warren Lacy 11/11/21  At the [facility], you and your health needs are our priority. We have a designated team specialized in the treatment of Heart Failure. This Care Team includes your primary Heart Failure Specialized Cardiologist (physician), Advanced Practice Providers (APPs- Physician Assistants and Nurse Practitioners), and Pharmacist who all work together to provide you with the care you need, when you need it.   You may see any of the following providers on your designated Care Team at your next follow up:  Dr Glori Bickers Dr Haynes Kerns, NP Lyda Jester, [place] [facility], [place] Audry Riles, PharmD   Please be sure to bring in all your medications bottles to every appointment.   Need to Contact us:  If you have  any questions or concerns before your next appointment please send [place] a message through [place] or call our office at [phone].    TO LEAVE A MESSAGE FOR THE NURSE SELECT OPTION 2, PLEASE LEAVE A MESSAGE INCLUDING: YOUR NAME DATE OF BIRTH CALL BACK NUMBER REASON FOR CALL**this is important as we prioritize the call backs  YOU WILL RECEIVE A CALL BACK THE SAME DAY AS LONG AS YOU CALL BEFORE 4:00 PM

## 2021-10-05 NOTE — Progress Notes (Signed)
Medication Samples have been provided to the patient.  Drug name: Wilder Glade       Strength: 10 mg        Qty: 4  LOT: [id number]  Exp.Date: 01/13/24  Dosing instructions: Take 1 tab Daily  The patient has been instructed regarding the correct time, dose, and frequency of taking this medication, including desired effects and most common side effects.   Julia West 11:22 AM 10/05/2021

## 2021-10-05 NOTE — Telephone Encounter (Signed)
Pt aware, agreeable, and verbalized understanding 

## 2021-10-05 NOTE — Telephone Encounter (Signed)
-----   Message from Conrad Ulmer, NP sent at 10/05/2021  4:59 PM EDT ----- Renal function stable. No change.  WBC trending down.  UA with trace leukocytes and rare bacteria.  >500 Glucose. For now will stop [place].

## 2021-10-06 ENCOUNTER — Other Ambulatory Visit (HOSPITAL_COMMUNITY): Payer: Self-pay | Admitting: Cardiology

## 2021-10-06 DIAGNOSIS — I4819 Other persistent atrial fibrillation: Secondary | ICD-10-CM

## 2021-10-07 ENCOUNTER — Ambulatory Visit: Payer: Medicare Other

## 2021-10-07 ENCOUNTER — Encounter (HOSPITAL_COMMUNITY): Payer: Self-pay | Admitting: *Deleted

## 2021-10-07 ENCOUNTER — Other Ambulatory Visit: Payer: Medicare Other

## 2021-10-07 DIAGNOSIS — I5022 Chronic systolic (congestive) heart failure: Secondary | ICD-10-CM

## 2021-10-07 DIAGNOSIS — G4719 Other hypersomnia: Secondary | ICD-10-CM

## 2021-10-07 NOTE — Procedures (Signed)
   SLEEP STUDY REPORT Patient Information Study Date: 10/05/21 Patient Name: Julia West Patient ID: [id number] Birth Date: 02/09/2056 Age: 66 Gender: Female Referring Physician: [name], NP  TEST DESCRIPTION: Home sleep apnea testing was completed using the WatchPat, a Type 1 device, utilizing  peripheral arterial tonometry (PAT), chest movement, actigraphy, pulse oximetry, pulse rate, body position and snore.  AHI was calculated with apnea and hypopnea using valid sleep time as the denominator. RDI includes apneas,  hypopneas, and RERAs. The data acquired and the scoring of sleep and all associated events were performed in  accordance with the recommended standards and specifications as outlined in the AASM Manual for the Scoring of  Sleep and Associated Events 2.2.0 (2015).  FINDINGS: 1. Severe Obstructive Sleep Apnea with AHI 68.8/hr.  2. No Central Sleep Apnea with pAHIc 2.6/hr. 3. Oxygen desaturations as low as 69%. 4. Moderate snoring was present. O2 sats were < 88% for 272.6 min. 5. Total sleep time was 4 hrs and 16 min. 6. 15.4% of total sleep time was spent in REM sleep.  7. Shortened sleep onset latency at 5 min.  8. Shortened REM sleep onset latency at 37 min.  9. Total awakenings were 15 .   DIAGNOSIS:  Severe Obstructive Sleep Apnea (G47.33) Nocturnal Hypoxemia  RECOMMENDATIONS: 1. Clinical correlation of these findings is necessary. The decision to treat obstructive sleep apnea (OSA) is usually  based on the presence of apnea symptoms or the presence of associated medical conditions such as Hypertension,  Congestive Heart Failure, Atrial Fibrillation or Obesity. The most common symptoms of OSA are snoring, gasping for  breath while sleeping, daytime sleepiness and fatigue.   2. Initiating apnea therapy is recommended given the presence of symptoms and/or associated conditions.  Recommend proceeding with one of the following:   a. Auto-CPAP therapy with a  pressure range of 5-20cm H2O.   b. An oral appliance (OA) that can be obtained from certain dentists with expertise in sleep medicine. These are  primarily of use in non-obese patients with mild and moderate disease.   c. An ENT consultation which may be useful to look for specific causes of obstruction and possible treatment  options.   d. If patient is intolerant to PAP therapy, consider referral to ENT for evaluation for hypoglossal nerve stimulator.   3. Close follow-up is necessary to ensure success with CPAP or oral appliance therapy for maximum benefit .  4. A follow-up oximetry study on CPAP is recommended to assess the adequacy of therapy and determine the need  for supplemental oxygen or the potential need for Bi-level therapy. An arterial blood gas to determine the adequacy of  baseline ventilation and oxygenation should also be considered.  5. Healthy sleep recommendations include: adequate nightly sleep (normal 7-9 hrs/night), avoidance of caffeine after  noon and alcohol near bedtime, and maintaining a sleep environment that is cool, dark and quiet.  6. Weight loss for overweight patients is recommended. Even modest amounts of weight loss can significantly  improve the severity of sleep apnea.  7. Snoring recommendations include: weight loss where appropriate, side sleeping, and avoidance of alcohol before  bed.  8. Operation of motor vehicle should be avoided when sleepy.  Signature: Electronically Signed: 10/07/21 Fransico Him, MD; [facility]; [place], American Board of  Sleep Medicine

## 2021-10-08 ENCOUNTER — Telehealth (HOSPITAL_COMMUNITY): Payer: Self-pay

## 2021-10-08 NOTE — Telephone Encounter (Signed)
Heart Failure Patient Advocate Encounter  Completed [place] for [place] Program sent in an effort to reduce the patient's out of pocket expense for Farxiga to $0.    Application completed and faxed to [phone]   [place] patient assistance phone number for follow up is [phone].   Kerby Nora, PharmD, BCPS Heart Failure Stewardship Pharmacist Phone [phone]

## 2021-10-09 NOTE — Telephone Encounter (Signed)
Advanced Heart Failure Patient Advocate Encounter   Patient was approved to receive Farxiga from [place]  Effective dates: 10/08/21 through 03/14/22  Document scanned to chart. Will route to [facility]).  Charlann Boxer, CPhT

## 2021-10-12 ENCOUNTER — Telehealth: Payer: Self-pay | Admitting: *Deleted

## 2021-10-12 DIAGNOSIS — G4734 Idiopathic sleep related nonobstructive alveolar hypoventilation: Secondary | ICD-10-CM

## 2021-10-12 DIAGNOSIS — G4719 Other hypersomnia: Secondary | ICD-10-CM

## 2021-10-12 DIAGNOSIS — G4733 Obstructive sleep apnea (adult) (pediatric): Secondary | ICD-10-CM

## 2021-10-12 NOTE — Telephone Encounter (Signed)
CC'd Chart Routing History  Routing History  From: Sueanne Margarita, MD On: 10/07/2021 03:35 PM  To: Cv Div Sleep Studies (Pool)  Priority: Routine  Routing Comments:  Please let patient know that they have sleep apnea.  Recommend therapeutic CPAP titration for treatment of patient's sleep disordered breathing.  If unable to perform an in lab titration then initiate ResMed auto CPAP from 4 to 15cm H2O with heated humidity and mask of choice and overnight pulse ox on CPAP.          The patient has been notified of the result and verbalized understanding.  All questions (if any) were answered. Marolyn Hammock, CMA 6/78/9381 0:17 PM    Precert titration

## 2021-10-13 ENCOUNTER — Other Ambulatory Visit (HOSPITAL_COMMUNITY): Payer: Self-pay

## 2021-10-13 ENCOUNTER — Other Ambulatory Visit (HOSPITAL_COMMUNITY): Payer: Self-pay | Admitting: *Deleted

## 2021-10-13 MED ORDER — CARVEDILOL 3.125 MG PO TABS: 3.1250 mg | ORAL_TABLET | Freq: Two times a day (BID) | ORAL | 3 refills | Status: DC

## 2021-10-16 ENCOUNTER — Other Ambulatory Visit (HOSPITAL_COMMUNITY): Payer: Self-pay

## 2021-10-16 NOTE — Progress Notes (Signed)
Advanced Heart Failure Clinic Note   Referring Physician: Dr Avelino Leeds Primary Care: Dr Livia Snellen  Primary Cardiologist: Dr Percival Spanish  EP: Dr Curt Bears  HF Cardiologist: Dr. Aundra Dubin   HPI:  Ms Eagleson is a 66 year old with a history of PAF,  HTN, CAD, CABG 2003, hyperlipidemia, LBKA, lymphedema RLE/RUE, and chronic HFrEF.    Admitted with A fib RVR and A/C HFrEF. Diuresed with IV Lasix. Echo showed newly reduced EF 30-35%. Had DC-CV 09/22/21 with restoration of SR. Plan to f/u with Dr Curt Bears for ablation. Started on SGLT2i and MRA. Discharged on BB and ARB. Discharge weight 305 pounds.    Had f/u in the [facility]. Maintaining SR on Tikosyn.   Presented to [facility] 10/05/21. Overall was feeling fine.  Stated she is unaware when she goes into A fib. Ambulates with crutches. 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 Noted mild SOB with exertion. Denied PND/Orthopnea.  Appetite was ok. No fever or chills. Reported that weight at home was trending down from 315--->305 pounds. Reported taking all medications  Today she returns to HF clinic for pharmacist medication titration. At last visit with APP, Wilder Glade was stopped due to dysuria and concerning UA. A 14 day Zio was placed to quantify PVC burden. She was referred to [facility] for medication titration. Overall she is feeling ok today. Notes she gets tired quickly and has to take a nap every day. Her apple watch says she is now in Afib 100% of the time. She has EP follow up for possible ablation 11/06/21. No dizziness or lightheadedness. No CP or palpitations. She gets SOB quickly, when walking from her chair to the bathroom at home. Weight is usually 307-309 lbs at home.  She takes Lasix PRN ~1 time per week. She last took Lasix 3 days ago and plans to take another dose today for some leg swelling. No PND/orthopnea. Taking all medications as prescribed. Appetite is good and she endorses following a low salt diet.     HF  Medications: Carvedilol 3.125 mg BID Valsartan 320 mg daily Spironolactone 12.5 mg daily Lasix 20 mg PRN  Has the patient been experiencing any side effects to the medications prescribed?  no  Does the patient have any problems obtaining medications due to transportation or finances?   Has [facility] Medicare. Currently in the donut hole. She was approved for [place] P[place] for Wilder Glade but has now been taken off it. Eliqius is ~$150/month. Will start BMS PAP today for Eliquis. She is aware OOP expense report from the pharmacy will be needed before she can be approved. Samples of Eliquis given today in clinic.   Understanding of regimen: good Understanding of indications: good Potential of compliance: good Patient understands to avoid NSAIDs. Patient understands to avoid decongestants.    Pertinent Lab Values: 10/05/21: Serum creatinine 1.11, BUN 22, Potassium 4.6, Sodium 141  Vital Signs: Weight: 312 lbs (last clinic weight: 306.6 lbs) Blood pressure: 122/82  Heart rate: 92   Assessment/Plan: 1. Chronic HFrEF Echo 09/2021 down to 30-35% from previous 50-55% back 2022.  It is possible this is tachy mediated  but could also be PVCs induced. Will need repeat ECHO in 3 months. If EF remains low will need to consider cath. No recent cath. Had CABG in 2003. No chest pain.  -NYHA III, volume status mildly elevated. - Continue Lasix 20 mg PRN. She will take one dose of Lasix PRN today.  - Continue  carvedilol 3.125 mg BID - Continue valsartan 320 mg daily. Consider transition to [facility] next visit.  - Increase spironolactone to 25 mg daily. Repeat BMET next week at EP visit.  Wilder Glade recently discontinued due to dysuria and concerning UA.    2.PAF Followed in the A fib clinic - Continue Tikosyn - Has f/u with Dr Curt Bears for possible ablation    3. Obesity  Body mass index is 49.49 kg/m. Discussed portion control    4. 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  She will follow up with EP next week, 11/06/21.  -Has f/u with Dr Baird Kay. ? Ablation.     5. Snores Home sleep study positive for sleep apnea. Therapeutic CPAP titration was recommended.   6. CAD CABG 2003 No chest pain. On statin, bb, and Eliquis.     Follow up 1 week with EP and 2 months with Dr. Aundra Dubin.    Audry Riles, PharmD, BCPS, BCCP, CPP Heart Failure Clinic Pharmacist [phone]

## 2021-10-19 ENCOUNTER — Telehealth (HOSPITAL_COMMUNITY): Payer: Self-pay

## 2021-10-19 NOTE — Telephone Encounter (Signed)
Patient heart rates are controlled in the 70-90s overall feels ok mild SOB with exertion some fluid retention which PRN lasix controls. She is scheduled to see Dr. Curt Bears on 8/25 and would prefer to see him before changes made. ER precautions were reviewed with patient.

## 2021-10-19 NOTE — Telephone Encounter (Signed)
Patient called to report that since august 4th her apple watch has been telling her that she has been in afib. She doesn't want to go to the ER and hasn't missed any doses Tikosyn. She has noticed an increase in sob and has experienced some nausea but denies swelling, dizziness,chest pain

## 2021-10-19 NOTE — Telephone Encounter (Signed)
Hey can yall get this patient in for an appointment? See below

## 2021-10-21 ENCOUNTER — Encounter (INDEPENDENT_AMBULATORY_CARE_PROVIDER_SITE_OTHER): Payer: Self-pay

## 2021-10-22 DIAGNOSIS — M7989 Other specified soft tissue disorders: Secondary | ICD-10-CM | POA: Insufficient documentation

## 2021-10-22 DIAGNOSIS — I480 Paroxysmal atrial fibrillation: Secondary | ICD-10-CM | POA: Insufficient documentation

## 2021-10-22 DIAGNOSIS — I4819 Other persistent atrial fibrillation: Secondary | ICD-10-CM | POA: Diagnosis not present

## 2021-10-22 NOTE — Progress Notes (Signed)
pal    Cardiology Office Note   Date:  10/23/2021   ID:  Julia West, DOB 1955/07/11, MRN [id number]  PCP:  [name], MD  Cardiologist:   [name], MD  Chief Complaint  Patient presents with   Atrial Fibrillation        History of Present Illness: Julia West is a 66 y.o. female who presented for evaluation of CAD. She was found to be in atrial fib at a previous visit.   She wore a monitor and was noted to be persistent atrial fib with some tachybradycardia rates   I sent her for cardioversion last month.  She gets around with crutches on her prosthesis.  She went back into atrial fib.  She was hospitalized and started on Tikosyn and has been maintaining sinus rhythm.  She had some leg swelling and I gave her PRN Lasix at the last appt.   Since I last saw her she been back in the hospital with heart failure and atrial fibrillation.  She had an ejection fraction of 30 to 35% which was new compared to previous.  She had volume overload and was treated with IV diuresis.  It was thought probably that her cardiomyopathy was related to the atrial fibrillation.   She had cardioversion.  However, she went back into fibrillation the beginning of this month.  She can feel it and she knows it from her watch.  The patient when she is in sinus has some slow heart rates with PVCs and PACs.  She is tolerating anticoagulation.  She gets around on her prosthesis.  She is not having any new shortness of breath.  Her weights seem to be stable around 305 pounds.  She is not having any new swelling, PND or orthopnea.  She is not having any presyncope or syncope.  She denies any chest pain.  She did get some dysuria after being sent home on Farxiga and this was stopped.  She has been followed in the advanced heart failure clinic.  She is due to see EP for consideration of ablation.   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    S/p CABG x 2 in  2003   Cancer [facility])    basal cell face   Complication of anesthesia    states low O2 sats post-op 11/13, always slow to awaken   Degenerative joint disease    Dehiscence of amputation stump ([place])    left BKA   Depression    Fatty liver    HTN (hypertension)    Hyperlipidemia LDL goal <70    Incarcerated hernia    Internal and external hemorrhoids without complication    Lymphedema of arm    right   Morbid obesity (HCC)    Rheumatoid arthritis ([place])    Surgical wound, non healing ABDOMINAL    has wound vac @ 125 mm Hg   Ventral hernia     Past Surgical History:  Procedure Laterality Date   AMPUTATION Left 05/06/2017   Procedure: BELOW KNEE AMPUTATION;  Surgeon: Newt Minion, MD;  Location: [place];  Service: Orthopedics;  Laterality: Left;   APPLICATION OF WOUND VAC     BOWEL RESECTION  02/07/2012   Procedure: SMALL BOWEL RESECTION;  Surgeon: Gayland Curry, MD,FACS;  Location: [place];  Service: General;;   CARDIOVASCULAR STRESS TEST  01-17-2012  DR Dawsyn Zurn   LOW RISK NUCLEAR TEST  CARDIOVERSION N/A 07/14/2020   Procedure: CARDIOVERSION;  Surgeon: Arnoldo Lenis, MD;  [facility];  Service: Endoscopy;  Laterality: N/A;   CARDIOVERSION N/A 09/22/2021   Procedure: CARDIOVERSION;  Surgeon: Jerline Pain, MD;  [facility];  Service: Cardiovascular;  Laterality: N/A;   CESAREAN SECTION     x 4 in remote past   [place]  2003   by Dr. Servando Snare. LIMA to the LAD, free RIMA to the circumflex. Stress perfusion study December 2009 with no high-risk areas of ischemia. She has a well-preserved ejection fraction   HYSTEROSCOPY WITH D & C N/A 05/14/2014   Procedure: DILATATION AND CURETTAGE (no specimen); HYSTEROSCOPY;  Surgeon: Jonnie Kind, MD;  Location: [facility];  Service: Gynecology;  Laterality: N/A;   I & D EXTREMITY Left 06/21/2017   Procedure: IRRIGATION AND DEBRIDEMENT OF LEFT LEG AMPUTATION SITE;  Surgeon: Wallace Going, DO;  Location: [facility];  Service: Plastics;  Laterality: Left;   INCISION AND DRAINAGE OF WOUND  04/17/2012   Procedure: IRRIGATION AND DEBRIDEMENT WOUND;  Surgeon: Theodoro Kos, DO;  Location: [place];  Service: Plastics;  Laterality: N/A;  OF ABDOMINAL WOUND, SURGICAL PREP AND PLACEMENT OF VAC, REMOVAL FOREHEAD SKIN LESION   INCISION AND DRAINAGE OF WOUND N/A 04/24/2012   Procedure: IRRIGATION AND DEBRIDEMENT WOUND;  Surgeon: Theodoro Kos, DO;  Location: [place];  Service: Plastics;  Laterality: N/A;  I & D ABDOMINAL WOUND WITH VAC AND ACELL   INCISION AND DRAINAGE OF WOUND N/A 05/01/2012   Procedure: IRRIGATION AND DEBRIDEMENT WOUND;  Surgeon: Theodoro Kos, DO;  Location: [place];  Service: Plastics;  Laterality: N/A;  WITH SURGICAL PREP AND PLACEMENT OF VAC   INCISION AND DRAINAGE OF WOUND N/A 05/08/2012   Procedure: IRRIGATION AND DEBRIDEMENT OF ABD WOUND SURGICAL PREP AND PLACEMENT OF VAC ;  Surgeon: Theodoro Kos, DO;  Location: [place];  Service: Plastics;  Laterality: N/A;  IRRIGATION AND DEBRIDEMENT OF ABD WOUND SURGICAL PREP AND PLACEMENT OF VAC    INCISION AND DRAINAGE OF WOUND N/A 05/15/2012   Procedure: IRRIGATION AND DEBRIDEMENT OF ABDOMINAL WOUND WITH POSSIBLE SURGICAL PREP AND PLACEMENT OF VAC;  Surgeon: Theodoro Kos, DO;  Location: [place];  Service: Plastics;  Laterality: N/A;   INCISION AND DRAINAGE OF WOUND N/A 05/22/2012   Procedure: IRRIGATION AND DEBRIDEMENT OF ABODOMINAL WOUND WITH  SURGICAL PREP AND VAC PLACEMENT;  Surgeon: Theodoro Kos, DO;  Location: [place];  Service: Plastics;  Laterality: N/A;   INCISION AND DRAINAGE OF WOUND N/A 06/28/2012   Procedure: IRRIGATION AND DEBRIDEMENT OF ABDOMINAL ULCER SURGICAL PREP AND PLACEMENT OF ACELL AND VAC;  Surgeon: Theodoro Kos, DO;  Location: [place];  Service: Plastics;  Laterality: N/A;   INCISIONAL  HERNIA REPAIR  02/07/2012   Procedure: HERNIA REPAIR INCISIONAL;  Surgeon: Gayland Curry, MD,FACS;  Location: [place];  Service: General;;  Open, Primary repair, strangulated Incisional hernia.   LESION REMOVAL  04/17/2012   Procedure: LESION REMOVAL;  Surgeon: Theodoro Kos, DO;  Location: [place];  Service: Plastics;  Laterality: N/A;   CENTER OF FOREHEAD, REMOVAL FORHEAD SKIN LESION   ORIF TOE FRACTURE Left 02/09/2017   Procedure: EXCISION TALAR HEAD, INTERNAL FIXATION MEDIAL COLUMN LEFT FOOT;  Surgeon: Newt Minion, MD;  Location: [place];  Service: Orthopedics;  Laterality: Left;   POLYPECTOMY N/A  05/14/2014   Procedure: ENDOMETRIAL POLYPECTOMY;  Surgeon: Jonnie Kind, MD;  [facility];  Service: Gynecology;  Laterality: N/A;   STUMP REVISION Left 08/31/2017   Procedure: REVISION LEFT BELOW KNEE AMPUTATION;  Surgeon: Newt Minion, MD;  Location: [place];  Service: Orthopedics;  Laterality: Left;     Current Outpatient Medications  Medication Sig Dispense Refill   acetaminophen (TYLENOL) 325 MG tablet Take 650 mg by mouth 2 (two) times daily as needed (back ache).     apixaban (ELIQUIS) 5 MG TABS tablet TAKE 1 TABLET BY MOUTH TWICE  DAILY 180 tablet 3   atorvastatin (LIPITOR) 20 MG tablet TAKE 1 TABLET BY MOUTH DAILY 90 tablet 0   BELSOMRA 20 MG TABS Take 1 tablet by mouth at bedtime as needed. (Patient taking differently: Take 20 mg by mouth at bedtime.) 90 tablet 1   carvedilol (COREG) 3.125 MG tablet Take 1 tablet (3.125 mg total) by mouth 2 (two) times daily with a meal. 180 tablet 3   dofetilide (TIKOSYN) 125 MCG capsule Take 1 capsule (125 mcg total) by mouth 2 (two) times daily. 180 capsule 1   fenofibrate 160 MG tablet TAKE 1 TABLET BY MOUTH DAILY (Patient taking differently: Take 160 mg by mouth daily.) 90 tablet 0   fexofenadine (ALLEGRA) 180 MG tablet Take 1 tablet (180 mg total) by mouth daily. For allergy symptoms 90 tablet 3   fluticasone (FLONASE) 50  MCG/ACT nasal spray Place 1 spray into both nostrils daily as needed for allergies or rhinitis.     furosemide (LASIX) 20 MG tablet Take 1 tablet (20 mg total) by mouth daily as needed. 30 tablet 6   nitroGLYCERIN (NITROSTAT) 0.4 MG SL tablet Place 1 tablet (0.4 mg total) under the tongue every 5 (five) minutes x 3 doses as needed for chest pain. If pain persist after call 911 25 tablet 6   sertraline (ZOLOFT) 50 MG tablet TAKE 1 TABLET BY MOUTH DAILY 90 tablet 0   spironolactone (ALDACTONE) 25 MG tablet Take 0.5 tablets (12.5 mg total) by mouth daily. 15 tablet 6   valsartan (DIOVAN) 320 MG tablet TAKE 1 TABLET BY MOUTH DAILY 90 tablet 1   No current facility-administered medications for this visit.    Allergies:   Ace inhibitors    ROS:  Please see the history of present illness.   Otherwise, review of systems are positive for none.   All other systems are reviewed and negative.    PHYSICAL EXAM: VS:  BP 136/79   Pulse 91   Ht '5\' 7"'$  (1.702 m)   Wt (!) 308 lb 3.2 oz (139.8 kg)   SpO2 94%   BMI 48.27 kg/m  , BMI Body mass index is 48.27 kg/m. GEN:  No distress NECK:  No jugular venous distention at 90 degrees, waveform within normal limits, carotid upstroke brisk and symmetric, no bruits, no thyromegaly LYMPHATICS:  No cervical adenopathy LUNGS:  Clear to auscultation bilaterally BACK:  No CVA tenderness CHEST:  Unremarkable HEART:  S1 and S2 within normal limits, no S3, no clicks, no rubs, no murmurs, irregular ABD:  Positive bowel sounds normal in frequency in pitch, no bruits, no rebound, no guarding, unable to assess midline mass or bruit with the patient seated. EXT:  2 plus pulses throughout, mild right leg swelling edema, no cyanosis no clubbing status post left lower extremity amputation SKIN:  No rashes no nodules NEURO:  Cranial nerves II through XII grossly intact, motor grossly intact  throughout [facility]:  Cognitively intact, oriented to person place and time   EKG:   EKG is  ordered today. Atrial fibrillation, rate 101, premature, left axis deviation, poor anterior R wave progression, premature ectopic complexes.   Recent Labs: 09/21/2021: ALT 16; TSH 0.951 09/24/2021: Magnesium 2.2 10/05/2021: B Natriuretic Peptide 243.7; BUN 22; Creatinine, Ser 1.11; Hemoglobin 14.3; Platelets 312; Potassium 4.6; Sodium 141    Lipid Panel    Component Value Date/Time   CHOL 144 08/19/2020 0909   TRIG 107 08/19/2020 0909   TRIG 247 (H) 09/12/2013 1137   HDL 41 08/19/2020 0909   HDL 35 (L) 09/12/2013 1137   CHOLHDL 3.5 08/19/2020 0909   LDLCALC 83 08/19/2020 0909   LDLCALC 115 (H) 09/12/2013 1137      Wt Readings from Last 3 Encounters:  10/23/21 (!) 308 lb 3.2 oz (139.8 kg)  10/05/21 (!) 306 lb 9.6 oz (139.1 kg)  10/01/21 (!) 309 lb (140.2 kg)      Other studies Reviewed: Additional studies/ records that were reviewed today include: Hospital records Review of the above records demonstrates: See elsewhere   ASSESSMENT AND PLAN:  CAD:    The patient has no new sypmtoms.  No further cardiovascular testing is indicated.  We will continue with aggressive risk reduction and meds as listed.  At this point we do not think that there is an ischemic etiology to her cardiomyopathy but after we have resolution of her arrhythmia if she is still having a reduced ejection fraction we need to further evaluate this.  ATRIAL FIB:   Julia West has a CHA2DS2 - VASc score of 4.  She is going to see EP for consideration of ablation.  I will continue the Tikosyn for now although she seems to have failed this therapy.   HTN:  The blood pressure is controlled in the context of treating her reduced ejection fraction.   DYSLIPIDEMIA:   LDL was 83 with an HDL of 41.  Continue current meds.  I will repeat this in the future and consider med titration   FATIGUE: Her home sleep study apparently was abnormal and she is awaiting an in person sleep study.  ACUTE SYSTOLIC  HF: For now she will continue the meds as listed.  The neck step will be probably switching her to [facility].   ELEVATED WBC: Wilder Glade has been discontinued because of dysuria and elevated white blood cell count.  I will repeat a CBC.  Current medicines are reviewed at length with the patient today.  The patient does not have concerns regarding medicines.  The following changes have been made: As above  Labs/ tests ordered today include:    Orders Placed This Encounter  Procedures   CBC   EKG 12-Lead    Disposition:   FU with me in [place] in a few months.    Signed, [name], MD  10/23/2021 12:34 PM    [facility]

## 2021-10-23 ENCOUNTER — Ambulatory Visit: Payer: Medicare Other | Admitting: Cardiology

## 2021-10-23 ENCOUNTER — Encounter: Payer: Self-pay | Admitting: Cardiology

## 2021-10-23 VITALS — BP 136/79 | HR 91 | Ht 67.0 in | Wt 308.2 lb

## 2021-10-23 DIAGNOSIS — E785 Hyperlipidemia, unspecified: Secondary | ICD-10-CM

## 2021-10-23 DIAGNOSIS — M7989 Other specified soft tissue disorders: Secondary | ICD-10-CM

## 2021-10-23 DIAGNOSIS — I480 Paroxysmal atrial fibrillation: Secondary | ICD-10-CM

## 2021-10-23 DIAGNOSIS — I10 Essential (primary) hypertension: Secondary | ICD-10-CM

## 2021-10-23 DIAGNOSIS — Z79899 Other long term (current) drug therapy: Secondary | ICD-10-CM

## 2021-10-23 DIAGNOSIS — I2510 Atherosclerotic heart disease of native coronary artery without angina pectoris: Secondary | ICD-10-CM | POA: Diagnosis not present

## 2021-10-23 NOTE — Patient Instructions (Signed)
Medication Instructions:  Your Physician recommend you continue on your current medication as directed.    *If you need a refill on your cardiac medications before your next appointment, please call your pharmacy*   Lab Work: Your physician recommends lab work today (CBC)  If you have labs (blood work) drawn today and your tests are completely normal, you will receive your results only by: [place] (if you have MyChart) OR A paper copy in the mail If you have any lab test that is abnormal or we need to change your treatment, we will call you to review the results.   Testing/Procedures: None ordered today   Follow-Up: At [facility], you and your health needs are our priority.  As part of our continuing mission to provide you with exceptional heart care, we have created designated Provider Care Teams.  These Care Teams include your primary Cardiologist (physician) and Advanced Practice Providers (APPs -  Physician Assistants and Nurse Practitioners) who all work together to provide you with the care you need, when you need it.  We recommend signing up for the patient portal called "MyChart".  Sign up information is provided on this After Visit Summary.  MyChart is used to connect with patients for Virtual Visits (Telemedicine).  Patients are able to view lab/test results, encounter notes, upcoming appointments, etc.  Non-urgent messages can be sent to your provider as well.   To learn more about what you can do with MyChart, go to [email or web address].    Your next appointment:   1 month(s) (late September)  The format for your next appointment:   In Person at [facility] office  Provider:   [name], MD {

## 2021-10-24 ENCOUNTER — Encounter: Payer: Self-pay | Admitting: Cardiology

## 2021-10-24 LAB — CBC
Hematocrit: 48.2 % — ABNORMAL HIGH (ref 34.0–46.6)
Hemoglobin: 15.6 g/dL (ref 11.1–15.9)
MCH: 27.8 pg (ref 26.6–33.0)
MCHC: 32.4 g/dL (ref 31.5–35.7)
MCV: 86 fL (ref 79–97)
Platelets: 299 10*3/uL (ref 150–450)
RBC: 5.61 x10E6/uL — ABNORMAL HIGH (ref 3.77–5.28)
RDW: 14.6 % (ref 11.7–15.4)
WBC: 15 10*3/uL — ABNORMAL HIGH (ref 3.4–10.8)

## 2021-10-26 ENCOUNTER — Other Ambulatory Visit (HOSPITAL_COMMUNITY): Payer: Medicare Other

## 2021-10-27 ENCOUNTER — Telehealth (HOSPITAL_COMMUNITY): Payer: Self-pay | Admitting: *Deleted

## 2021-10-27 ENCOUNTER — Other Ambulatory Visit: Payer: Self-pay | Admitting: Family Medicine

## 2021-10-27 NOTE — Telephone Encounter (Signed)
Pt left vm requesting a return call about appt tomorrow I called pt back no answer/left vm.

## 2021-10-28 ENCOUNTER — Telehealth (HOSPITAL_COMMUNITY): Payer: Self-pay | Admitting: Pharmacist

## 2021-10-28 ENCOUNTER — Ambulatory Visit (HOSPITAL_COMMUNITY)
Admission: RE | Admit: 2021-10-28 | Discharge: 2021-10-28 | Disposition: A | Discharge status: Home / Self Care | Payer: Medicare Other | Source: Ambulatory Visit | Attending: Cardiology | Admitting: Cardiology

## 2021-10-28 VITALS — BP 122/82 | HR 92 | Wt 312.0 lb

## 2021-10-28 DIAGNOSIS — Z6841 Body Mass Index (BMI) 40.0 and over, adult: Secondary | ICD-10-CM | POA: Insufficient documentation

## 2021-10-28 DIAGNOSIS — I2510 Atherosclerotic heart disease of native coronary artery without angina pectoris: Secondary | ICD-10-CM | POA: Insufficient documentation

## 2021-10-28 DIAGNOSIS — I890 Lymphedema, not elsewhere classified: Secondary | ICD-10-CM | POA: Diagnosis not present

## 2021-10-28 DIAGNOSIS — Z951 Presence of aortocoronary bypass graft: Secondary | ICD-10-CM | POA: Diagnosis not present

## 2021-10-28 DIAGNOSIS — R0683 Snoring: Secondary | ICD-10-CM | POA: Insufficient documentation

## 2021-10-28 DIAGNOSIS — Z7901 Long term (current) use of anticoagulants: Secondary | ICD-10-CM | POA: Insufficient documentation

## 2021-10-28 DIAGNOSIS — E669 Obesity, unspecified: Secondary | ICD-10-CM | POA: Diagnosis not present

## 2021-10-28 DIAGNOSIS — I493 Ventricular premature depolarization: Secondary | ICD-10-CM | POA: Diagnosis not present

## 2021-10-28 DIAGNOSIS — E785 Hyperlipidemia, unspecified: Secondary | ICD-10-CM | POA: Diagnosis not present

## 2021-10-28 DIAGNOSIS — I480 Paroxysmal atrial fibrillation: Secondary | ICD-10-CM | POA: Diagnosis not present

## 2021-10-28 DIAGNOSIS — I110 Hypertensive heart disease with heart failure: Secondary | ICD-10-CM | POA: Diagnosis not present

## 2021-10-28 DIAGNOSIS — I5022 Chronic systolic (congestive) heart failure: Secondary | ICD-10-CM | POA: Diagnosis not present

## 2021-10-28 MED ORDER — SPIRONOLACTONE 25 MG PO TABS: 25.0000 mg | ORAL_TABLET | Freq: Every day | ORAL | 3 refills | Status: DC

## 2021-10-28 NOTE — Patient Instructions (Signed)
It was a pleasure seeing you today!  MEDICATIONS: -We are changing your medications today -Increase spironolactone to 25 mg (1 tablet) daily -Call if you have questions about your medications.   NEXT APPOINTMENT: Return to clinic in 2 months with Dr. McLean.  In general, to take care of your heart failure: -Limit your fluid intake to 2 Liters (half-gallon) per day.   -Limit your salt intake to ideally 2-3 grams (2000-3000 mg) per day. -Weigh yourself daily and record, and bring that "weight diary" to your next appointment.  (Weight gain of 2-3 pounds in 1 day typically means fluid weight.) -The medications for your heart are to help your heart and help you live longer.   -Please contact us before stopping any of your heart medications.  Call the clinic at [phone] with questions or to reschedule future appointments.  

## 2021-10-28 NOTE — Telephone Encounter (Signed)
Medication Samples have been provided to the patient.   Drug name: Eliquis     Strength: 5 mg      Qty: 4 boxes                 LOT: [id number] Exp.Date: 06/2023   Dosing instructions: 1 tablet BID   The patient has been instructed regarding the correct time, dose, and frequency of taking this medication, including desired effects and most common side effects.   Audry Riles, PharmD, BCPS, CPP Heart Failure Clinic Pharmacist [phone]

## 2021-10-29 ENCOUNTER — Telehealth (HOSPITAL_COMMUNITY): Payer: Self-pay | Admitting: Pharmacist

## 2021-10-29 NOTE — Telephone Encounter (Signed)
Sent in Manufacturer's Assistance application to [employer] for Eliquis.    Application pending, will continue to follow.  Jamason Peckham, PharmD, BCPS, BCCP, CPP Heart Failure Clinic Pharmacist [phone]   

## 2021-11-03 NOTE — Patient Instructions (Signed)
Our records indicate that you are due for your annual mammogram/breast imaging. While there is no way to prevent breast cancer, early detection provides the best opportunity for curing it. For women over the age of 40, the American Cancer Society recommends a yearly clinical breast exam and a yearly mammogram. These practices have saved thousands of lives. We need your help to ensure that you are receiving optimal medical care. Please call the imaging location that has done you previous mammograms. Please remember to list us as your primary care. This helps make sure we receive a report and can update your chart.  Below is the contact information for several local breast imaging centers. You may call the location that works best for you, and they will be happy to assistance in making you an appointment. You do not need an order for a regular screening mammogram. However, if you are having any problems or concerns with you breast area, please let your primary care provider know, and appropriate orders will be placed. Please let our office know if you have any questions or concerns. Or if you need information for another imaging center not on this list or outside of the area. We are commented to working with you on your health care journey.   The mobile unit/bus ([facility]) - they come twice a month to our location.  These appointments can be made through our office or by call The [facility] Center  [facility]  [address] [place], [place] [address] Phone [phone]  [facility] Radiology Department  [address]  [place], [place] [address] [phone]  [facility])  [address] [place], [place] [address] [phone]  [facility] [place]  [address] [place] [address] [phone]  [facility]  [address], [address] [place] [address] [phone]  [facility] [place]  [address] [place], [place] [address] [phone]  [facility] [address] [place], [place] [address] [phone]  [facility] [address]  [address] [place], [place] [address] [phone]  [facility] [address] [place] [address] [phone]     

## 2021-11-04 ENCOUNTER — Ambulatory Visit (INDEPENDENT_AMBULATORY_CARE_PROVIDER_SITE_OTHER): Payer: Medicare Other | Admitting: Family Medicine

## 2021-11-04 ENCOUNTER — Encounter: Payer: Self-pay | Admitting: Family Medicine

## 2021-11-04 VITALS — BP 134/79 | HR 59 | Temp 96.1°F | Ht 67.0 in | Wt 308.0 lb

## 2021-11-04 DIAGNOSIS — I10 Essential (primary) hypertension: Secondary | ICD-10-CM

## 2021-11-04 DIAGNOSIS — I5023 Acute on chronic systolic (congestive) heart failure: Secondary | ICD-10-CM

## 2021-11-04 DIAGNOSIS — E11610 Type 2 diabetes mellitus with diabetic neuropathic arthropathy: Secondary | ICD-10-CM

## 2021-11-04 DIAGNOSIS — Z79899 Other long term (current) drug therapy: Secondary | ICD-10-CM

## 2021-11-04 DIAGNOSIS — E559 Vitamin D deficiency, unspecified: Secondary | ICD-10-CM | POA: Diagnosis not present

## 2021-11-04 DIAGNOSIS — E66813 Obesity, class 3: Secondary | ICD-10-CM

## 2021-11-04 DIAGNOSIS — F5101 Primary insomnia: Secondary | ICD-10-CM | POA: Diagnosis not present

## 2021-11-04 DIAGNOSIS — E785 Hyperlipidemia, unspecified: Secondary | ICD-10-CM

## 2021-11-04 DIAGNOSIS — Z6841 Body Mass Index (BMI) 40.0 and over, adult: Secondary | ICD-10-CM

## 2021-11-04 LAB — BAYER DCA HB A1C WAIVED: HB A1C (BAYER DCA - WAIVED): 5.7 % — ABNORMAL HIGH (ref 4.8–5.6)

## 2021-11-04 NOTE — Progress Notes (Signed)
Subjective:  Patient ID: Julia West,  female    DOB: Feb 23, 1956  Age: 66 y.o.    CC: Medical Management of Chronic Issues   HPI Julia West presents for  follow-up of hypertension. Patient has no history of headache chest pain or shortness of breath or recent cough. Patient also denies symptoms of TIA such as numbness weakness lateralizing. Patient denies side effects from medication. States taking it regularly.  Patient also  in for follow-up of elevated cholesterol. Doing well without complaints on current medication. Denies side effects  including myalgia and arthralgia and nausea. Also in today for liver function testing. Currently no chest pain, shortness of breath or other cardiovascular related symptoms noted.   Pt. Denies hx of DM. A1c ordered as a precaution. 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   She has a past surgical history that includes Cesarean section; Cholecystectomy; Coronary artery bypass graft (2003); Incisional hernia repair (02/07/2012); Bowel resection (02/07/2012); Application if wound vac; Incision and drainage of wound (04/17/2012); Lesion removal (04/17/2012); Incision and drainage of wound (N/A, 04/24/2012); Incision and drainage of wound (N/A, 05/01/2012); Incision and drainage of wound (N/A, 05/08/2012); Cardiovascular stress test (01-17-2012  [facility]); Incision and drainage of wound (N/A, 05/15/2012); Incision and drainage of wound (N/A, 05/22/2012); Incision and drainage of wound (N/A, 06/28/2012); polypectomy (N/A,  05/14/2014); Hysteroscopy with D & C (N/A, 05/14/2014); ORIF toe fracture (Left, 02/09/2017); Amputation (Left, 05/06/2017); I & D extremity (Left, 06/21/2017); Stump revision (Left, 08/31/2017); Cardioversion (N/A, 07/14/2020); and Cardioversion (N/A, 09/22/2021).   Her family history includes Birth defects in her son; Diabetes in her brother; Heart attack in her mother; Heart disease in her mother; Hypertension in her mother and sister; Obesity in her mother; Sudden death in her mother.She reports that she quit smoking about 9 years ago. Her smoking use included cigarettes. She has a 25.00 pack-year smoking history. She has been exposed to tobacco smoke. She has never used smokeless tobacco. She reports that she does not drink alcohol and does not use drugs.  Current Outpatient Medications on File Prior to Visit  Medication Sig Dispense Refill   acetaminophen (TYLENOL) 325 MG tablet Take 650 mg by mouth 2 (two) times daily as needed (back ache).     apixaban (ELIQUIS) 5 MG TABS tablet TAKE 1 TABLET BY MOUTH TWICE  DAILY 180 tablet 3   atorvastatin (LIPITOR) 20 MG tablet TAKE 1 TABLET BY MOUTH DAILY 90 tablet 0   BELSOMRA 20 MG TABS Take 1 tablet by mouth at bedtime as needed. (Patient taking differently: Take 20 mg by mouth at bedtime.) 90 tablet 1   carvedilol (COREG) 3.125 MG tablet Take 1 tablet (3.125 mg total) by mouth 2 (two) times daily with a meal. 180 tablet 3   dofetilide (TIKOSYN) 125 MCG capsule Take 1 capsule (125 mcg total) by mouth 2 (two) times daily. 180 capsule 1   fenofibrate 160 MG tablet TAKE 1 TABLET BY MOUTH DAILY (Patient taking differently: Take 160 mg by mouth daily.) 90 tablet 0   fexofenadine (ALLEGRA) 180 MG tablet Take 1 tablet (180 mg total) by mouth daily. For allergy symptoms 90 tablet 3  fluticasone (FLONASE) 50 MCG/ACT nasal spray Place 1 spray into both nostrils daily as needed for allergies or rhinitis.     furosemide (LASIX) 20 MG tablet Take 1 tablet (20 mg total) by  mouth daily as needed. 30 tablet 6   nitroGLYCERIN (NITROSTAT) 0.4 MG SL tablet Place 1 tablet (0.4 mg total) under the tongue every 5 (five) minutes x 3 doses as needed for chest pain. If pain persist after call 911 25 tablet 6   sertraline (ZOLOFT) 50 MG tablet TAKE 1 TABLET BY MOUTH DAILY 90 tablet 0   spironolactone (ALDACTONE) 25 MG tablet Take 1 tablet (25 mg total) by mouth daily. 90 tablet 3   valsartan (DIOVAN) 320 MG tablet TAKE 1 TABLET BY MOUTH DAILY 90 tablet 1   No current facility-administered medications on file prior to visit.    ROS Review of Systems  Constitutional: Negative.   HENT: Negative.    Eyes:  Negative for visual disturbance.  Respiratory:  Negative for shortness of breath.   Cardiovascular:  Negative for chest pain.  Gastrointestinal:  Negative for abdominal pain.  Musculoskeletal:  Negative for arthralgias.    Objective:  BP 134/79   Pulse (!) 59   Temp (!) 96.1 F (35.6 C)   Ht $R'5\' 7"'Zh$  (1.702 m)   Wt (!) 308 lb (139.7 kg)   SpO2 94%   BMI 48.24 kg/m   BP Readings from Last 3 Encounters:  11/04/21 134/79  10/28/21 122/82  10/23/21 136/79    Wt Readings from Last 3 Encounters:  11/04/21 (!) 308 lb (139.7 kg)  10/28/21 (!) 312 lb (141.5 kg)  10/23/21 (!) 308 lb 3.2 oz (139.8 kg)     Physical Exam Constitutional:      General: She is not in acute distress.    Appearance: She is well-developed.  Cardiovascular:     Rate and Rhythm: Normal rate and regular rhythm.  Pulmonary:     Breath sounds: Normal breath sounds.  Musculoskeletal:        General: Normal range of motion.  Skin:    General: Skin is warm and dry.  Neurological:     Mental Status: She is alert and oriented to person, place, and time.     Diabetic Foot Exam - Simple   No data filed     Lab Results  Component Value Date   HGBA1C 5.7 (H) 11/04/2021   HGBA1C 5.7 (H) 05/27/2020   HGBA1C 5.3 02/19/2020    Assessment & Plan:   Julia West was seen today for  medical management of chronic issues.  Diagnoses and all orders for this visit:  Hyperlipidemia, unspecified hyperlipidemia type -     Lipid panel  Essential hypertension -     CBC with Differential/Platelet -     CMP14+EGFR  Vitamin D deficiency -     VITAMIN D 25 Hydroxy (Vit-D Deficiency, Fractures)  Type 2 diabetes mellitus with Charcot's joint of left foot (HCC) -     Bayer DCA Hb A1c Waived  Controlled substance agreement signed -     Cancel: ToxASSURE Select 13 (MW), Urine -     ToxASSURE Select 13 (MW), Urine  Primary insomnia -     AMB Referral to [facility]  Class 3 severe obesity with serious comorbidity and body mass index (BMI) of 50.0 to 59.9 in adult, unspecified obesity type ([place])  Acute on chronic systolic CHF (congestive heart failure) ([place]) -     AMB Referral to [facility]  Coordinaton   I am having Britton L. Schor maintain her nitroGLYCERIN, fluticasone, fexofenadine, furosemide, Belsomra, Eliquis, atorvastatin, sertraline, fenofibrate, valsartan, dofetilide, acetaminophen, carvedilol, and spironolactone.  No orders of the defined types were placed in this encounter.    Follow-up: Return in about 6 months (around 05/07/2022).  [name], M.D.

## 2021-11-05 LAB — CMP14+EGFR
ALT: 6 IU/L (ref 0–32)
AST: 13 IU/L (ref 0–40)
Albumin/Globulin Ratio: 1.5 (ref 1.2–2.2)
Albumin: 4 g/dL (ref 3.9–4.9)
Alkaline Phosphatase: 46 IU/L (ref 44–121)
BUN/Creatinine Ratio: 18 (ref 12–28)
BUN: 19 mg/dL (ref 8–27)
Bilirubin Total: 0.5 mg/dL (ref 0.0–1.2)
CO2: 23 mmol/L (ref 20–29)
Calcium: 9.6 mg/dL (ref 8.7–10.3)
Chloride: 102 mmol/L (ref 96–106)
Creatinine, Ser: 1.05 mg/dL — ABNORMAL HIGH (ref 0.57–1.00)
Globulin, Total: 2.6 g/dL (ref 1.5–4.5)
Glucose: 87 mg/dL (ref 70–99)
Potassium: 4.6 mmol/L (ref 3.5–5.2)
Sodium: 141 mmol/L (ref 134–144)
Total Protein: 6.6 g/dL (ref 6.0–8.5)
eGFR: 58 mL/min/{1.73_m2} — ABNORMAL LOW (ref >59)

## 2021-11-05 LAB — LIPID PANEL
Chol/HDL Ratio: 3.4 ratio (ref 0.0–4.4)
Cholesterol, Total: 117 mg/dL (ref 100–199)
HDL: 34 mg/dL — ABNORMAL LOW (ref >39)
LDL Chol Calc (NIH): 64 mg/dL (ref 0–99)
Triglycerides: 100 mg/dL (ref 0–149)
VLDL Cholesterol Cal: 19 mg/dL (ref 5–40)

## 2021-11-05 LAB — CBC WITH DIFFERENTIAL/PLATELET
Basophils Absolute: 0.1 10*3/uL (ref 0.0–0.2)
Basos: 1 %
EOS (ABSOLUTE): 0.3 10*3/uL (ref 0.0–0.4)
Eos: 2 %
Hematocrit: 45.6 % (ref 34.0–46.6)
Hemoglobin: 14.8 g/dL (ref 11.1–15.9)
Immature Grans (Abs): 0 10*3/uL (ref 0.0–0.1)
Immature Granulocytes: 0 %
Lymphocytes Absolute: 6.8 10*3/uL — ABNORMAL HIGH (ref 0.7–3.1)
Lymphs: 47 %
MCH: 27.6 pg (ref 26.6–33.0)
MCHC: 32.5 g/dL (ref 31.5–35.7)
MCV: 85 fL (ref 79–97)
Monocytes Absolute: 1.1 10*3/uL — ABNORMAL HIGH (ref 0.1–0.9)
Monocytes: 7 %
Neutrophils Absolute: 6.3 10*3/uL (ref 1.4–7.0)
Neutrophils: 43 %
Platelets: 290 10*3/uL (ref 150–450)
RBC: 5.37 x10E6/uL — ABNORMAL HIGH (ref 3.77–5.28)
RDW: 14.6 % (ref 11.7–15.4)
WBC: 14.6 10*3/uL — ABNORMAL HIGH (ref 3.4–10.8)

## 2021-11-05 LAB — VITAMIN D 25 HYDROXY (VIT D DEFICIENCY, FRACTURES): Vit D, 25-Hydroxy: 15.9 ng/mL — ABNORMAL LOW (ref 30.0–100.0)

## 2021-11-06 ENCOUNTER — Encounter: Payer: Self-pay | Admitting: *Deleted

## 2021-11-06 ENCOUNTER — Ambulatory Visit: Payer: Medicare Other | Admitting: Cardiology

## 2021-11-06 ENCOUNTER — Encounter: Payer: Self-pay | Admitting: Cardiology

## 2021-11-06 VITALS — BP 128/82 | HR 93 | Ht 67.0 in | Wt 310.0 lb

## 2021-11-06 DIAGNOSIS — D6869 Other thrombophilia: Secondary | ICD-10-CM

## 2021-11-06 DIAGNOSIS — I4819 Other persistent atrial fibrillation: Secondary | ICD-10-CM | POA: Diagnosis not present

## 2021-11-06 DIAGNOSIS — I493 Ventricular premature depolarization: Secondary | ICD-10-CM

## 2021-11-06 MED ORDER — AMIODARONE HCL 200 MG PO TABS: ORAL_TABLET | ORAL | 0 refills | Status: DC

## 2021-11-06 MED ORDER — AMIODARONE HCL 200 MG PO TABS: 200.0000 mg | ORAL_TABLET | Freq: Every day | ORAL | 1 refills | Status: DC

## 2021-11-06 NOTE — Progress Notes (Signed)
Electrophysiology Office Note   Date:  11/06/2021   ID:  CHAREE TUMBLIN, DOB 1955/06/19, MRN [id number]  PCP:  [name], MD  Cardiologist:  Hoch[place] Primary Electrophysiologist:  Cree Napoli Meredith Leeds, MD    Chief Complaint: AF   History of Present Illness: Julia West is a 66 y.o. female who is being seen today for the evaluation of AF at the request of [name], MD. Presenting today for electrophysiology evaluation.  She has a history seen for atrial fibrillation on dofetilide, coronary artery disease post CABG, hypertension, morbid obesity.  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  She walks with crutches.  She was admitted to the hospital 09/21/2021 with atrial fibrillation and rapid rates associated with heart failure symptoms.  She was cardioverted.  Echo showed an ejection fraction of 30 to 35%.  Repeat echo shows a normalized ejection fraction.  She presented to A-fib clinic in sinus rhythm.  Weights remained stable.  She feels weak and fatigued as well as tired in atrial fibrillation.  She also had an elevated PVC burden on her cardiac monitor.  She would like to get back into normal rhythm.  Today, she denies symptoms of palpitations, chest pain, shortness of breath, orthopnea, PND, lower extremity edema, claudication, dizziness, presyncope, syncope, bleeding, or neurologic sequela. The patient is tolerating medications without difficulties.    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    S/p CABG x 2 in 2003   Cancer [facility])    basal cell face   Complication of anesthesia    states low O2 sats post-op 11/13, always slow to awaken   Degenerative joint disease    Dehiscence of amputation stump ([place])    left BKA   Depression    Fatty liver    HTN (hypertension)    Hyperlipidemia LDL goal <70    Incarcerated hernia    Internal and external hemorrhoids without complication    Lymphedema of arm     right   Morbid obesity (HCC)    Rheumatoid arthritis ([place])    Surgical wound, non healing ABDOMINAL    has wound vac @ 125 mm Hg   Ventral hernia    Past Surgical History:  Procedure Laterality Date   AMPUTATION Left 05/06/2017   Procedure: BELOW KNEE AMPUTATION;  Surgeon: Newt Minion, MD;  Location: [place];  Service: Orthopedics;  Laterality: Left;   APPLICATION OF WOUND VAC     BOWEL RESECTION  02/07/2012   Procedure: SMALL BOWEL RESECTION;  Surgeon: Gayland Curry, MD,FACS;  Location: [place];  Service: General;;   CARDIOVASCULAR STRESS TEST  01-17-2012  DR HOCHREIN   LOW RISK NUCLEAR TEST   CARDIOVERSION N/A 07/14/2020   Procedure: CARDIOVERSION;  Surgeon: Arnoldo Lenis, MD;  [facility];  Service: Endoscopy;  Laterality: N/A;   CARDIOVERSION N/A 09/22/2021   Procedure: CARDIOVERSION;  Surgeon: Jerline Pain, MD;  [facility];  Service: Cardiovascular;  Laterality: N/A;   CESAREAN SECTION     x 4 in remote past   [place]  2003   by Dr. Servando Snare. LIMA to the LAD, free RIMA to the circumflex. Stress perfusion study December 2009 with no high-risk areas of ischemia. She has a well-preserved ejection fraction   HYSTEROSCOPY WITH D & C N/A 05/14/2014   Procedure: DILATATION AND CURETTAGE (no specimen);  HYSTEROSCOPY;  Surgeon: Jonnie Kind, MD;  Location: [facility];  Service: Gynecology;  Laterality: N/A;   I & D EXTREMITY Left 06/21/2017   Procedure: IRRIGATION AND DEBRIDEMENT OF LEFT LEG AMPUTATION SITE;  Surgeon: Wallace Going, DO;  Location: [facility];  Service: Plastics;  Laterality: Left;   INCISION AND DRAINAGE OF WOUND  04/17/2012   Procedure: IRRIGATION AND DEBRIDEMENT WOUND;  Surgeon: Theodoro Kos, DO;  Location: [place];  Service: Plastics;  Laterality: N/A;  OF ABDOMINAL WOUND, SURGICAL PREP AND PLACEMENT OF VAC, REMOVAL FOREHEAD SKIN LESION   INCISION AND DRAINAGE OF WOUND N/A 04/24/2012   Procedure:  IRRIGATION AND DEBRIDEMENT WOUND;  Surgeon: Theodoro Kos, DO;  Location: [place];  Service: Plastics;  Laterality: N/A;  I & D ABDOMINAL WOUND WITH VAC AND ACELL   INCISION AND DRAINAGE OF WOUND N/A 05/01/2012   Procedure: IRRIGATION AND DEBRIDEMENT WOUND;  Surgeon: Theodoro Kos, DO;  Location: [place];  Service: Plastics;  Laterality: N/A;  WITH SURGICAL PREP AND PLACEMENT OF VAC   INCISION AND DRAINAGE OF WOUND N/A 05/08/2012   Procedure: IRRIGATION AND DEBRIDEMENT OF ABD WOUND SURGICAL PREP AND PLACEMENT OF VAC ;  Surgeon: Theodoro Kos, DO;  Location: [place];  Service: Plastics;  Laterality: N/A;  IRRIGATION AND DEBRIDEMENT OF ABD WOUND SURGICAL PREP AND PLACEMENT OF VAC    INCISION AND DRAINAGE OF WOUND N/A 05/15/2012   Procedure: IRRIGATION AND DEBRIDEMENT OF ABDOMINAL WOUND WITH POSSIBLE SURGICAL PREP AND PLACEMENT OF VAC;  Surgeon: Theodoro Kos, DO;  Location: [place];  Service: Plastics;  Laterality: N/A;   INCISION AND DRAINAGE OF WOUND N/A 05/22/2012   Procedure: IRRIGATION AND DEBRIDEMENT OF ABODOMINAL WOUND WITH  SURGICAL PREP AND VAC PLACEMENT;  Surgeon: Theodoro Kos, DO;  Location: [place];  Service: Plastics;  Laterality: N/A;   INCISION AND DRAINAGE OF WOUND N/A 06/28/2012   Procedure: IRRIGATION AND DEBRIDEMENT OF ABDOMINAL ULCER SURGICAL PREP AND PLACEMENT OF ACELL AND VAC;  Surgeon: Theodoro Kos, DO;  Location: [place];  Service: Plastics;  Laterality: N/A;   INCISIONAL HERNIA REPAIR  02/07/2012   Procedure: HERNIA REPAIR INCISIONAL;  Surgeon: Gayland Curry, MD,FACS;  Location: [place];  Service: General;;  Open, Primary repair, strangulated Incisional hernia.   LESION REMOVAL  04/17/2012   Procedure: LESION REMOVAL;  Surgeon: Theodoro Kos, DO;  Location: [place];  Service: Plastics;  Laterality: N/A;   CENTER OF FOREHEAD, REMOVAL FORHEAD SKIN LESION    ORIF TOE FRACTURE Left 02/09/2017   Procedure: EXCISION TALAR HEAD, INTERNAL FIXATION MEDIAL COLUMN LEFT FOOT;  Surgeon: Newt Minion, MD;  Location: [place];  Service: Orthopedics;  Laterality: Left;   POLYPECTOMY N/A 05/14/2014   Procedure: ENDOMETRIAL POLYPECTOMY;  Surgeon: Jonnie Kind, MD;  [facility];  Service: Gynecology;  Laterality: N/A;   STUMP REVISION Left 08/31/2017   Procedure: REVISION LEFT BELOW KNEE AMPUTATION;  Surgeon: Newt Minion, MD;  Location: [place];  Service: Orthopedics;  Laterality: Left;     Current Outpatient Medications  Medication Sig Dispense Refill   acetaminophen (TYLENOL) 325 MG tablet Take 650 mg by mouth 2 (two) times daily as needed (back ache).     amiodarone (PACERONE) 200 MG tablet Take 2 tablets (400 mg total) TWICE a day for 2 weeks, then take 1 tablet (200 mg total) TWICE a day for 2 weeks, then take 1 tablet (  200 mg total) ONCE a day 84 tablet 0   [START ON 12/06/2021] amiodarone (PACERONE) 200 MG tablet Take 1 tablet (200 mg total) by mouth daily. 90 tablet 1   apixaban (ELIQUIS) 5 MG TABS tablet TAKE 1 TABLET BY MOUTH TWICE  DAILY 180 tablet 3   atorvastatin (LIPITOR) 20 MG tablet TAKE 1 TABLET BY MOUTH DAILY 90 tablet 0   BELSOMRA 20 MG TABS Take 1 tablet by mouth at bedtime as needed. (Patient taking differently: Take 20 mg by mouth at bedtime.) 90 tablet 1   carvedilol (COREG) 3.125 MG tablet Take 1 tablet (3.125 mg total) by mouth 2 (two) times daily with a meal. 180 tablet 3   fenofibrate 160 MG tablet TAKE 1 TABLET BY MOUTH DAILY (Patient taking differently: Take 160 mg by mouth daily.) 90 tablet 0   fexofenadine (ALLEGRA) 180 MG tablet Take 1 tablet (180 mg total) by mouth daily. For allergy symptoms 90 tablet 3   fluticasone (FLONASE) 50 MCG/ACT nasal spray Place 1 spray into both nostrils daily as needed for allergies or rhinitis.     furosemide (LASIX) 20 MG tablet Take 1 tablet (20 mg total) by mouth daily as needed. 30 tablet 6    nitroGLYCERIN (NITROSTAT) 0.4 MG SL tablet Place 1 tablet (0.4 mg total) under the tongue every 5 (five) minutes x 3 doses as needed for chest pain. If pain persist after call 911 25 tablet 6   sertraline (ZOLOFT) 50 MG tablet TAKE 1 TABLET BY MOUTH DAILY 90 tablet 0   spironolactone (ALDACTONE) 25 MG tablet Take 1 tablet (25 mg total) by mouth daily. 90 tablet 3   valsartan (DIOVAN) 320 MG tablet TAKE 1 TABLET BY MOUTH DAILY 90 tablet 1   No current facility-administered medications for this visit.    Allergies:   Ace inhibitors   Social History:  The patient  reports that she quit smoking about 9 years ago. Her smoking use included cigarettes. She has a 25.00 pack-year smoking history. She has been exposed to tobacco smoke. She has never used smokeless tobacco. She reports that she does not drink alcohol and does not use drugs.   Family History:  The patient's family history includes Birth defects in her son; Diabetes in her brother; Heart attack in her mother; Heart disease in her mother; Hypertension in her mother and sister; Obesity in her mother; Sudden death in her mother.    ROS:  Please see the history of present illness.   Otherwise, review of systems is positive for none.   All other systems are reviewed and negative.    PHYSICAL EXAM: VS:  BP 128/82   Pulse 93   Ht '5\' 7"'$  (1.702 m)   Wt (!) 310 lb (140.6 kg)   SpO2 96%   BMI 48.55 kg/m  , BMI Body mass index is 48.55 kg/m. GEN: Well nourished, well developed, in no acute distress  HEENT: normal  Neck: no JVD, carotid bruits, or masses Cardiac: irregular; no murmurs, rubs, or gallops,no edema  Respiratory:  clear to auscultation bilaterally, normal work of breathing GI: soft, nontender, nondistended, + BS MS: no deformity or atrophy  Skin: warm and dry Neuro:  Strength and sensation are intact Psych: euthymic mood, full affect  EKG:  EKG is not ordered today. Personal review of the ekg ordered 10/23/21 shows atrial  fibrillation, rate 101  Recent Labs: 09/21/2021: TSH 0.951 09/24/2021: Magnesium 2.2 10/05/2021: B Natriuretic Peptide 243.7 11/04/2021: ALT 6; BUN 19; Creatinine,  Ser 1.05; Hemoglobin 14.8; Platelets 290; Potassium 4.6; Sodium 141    Lipid Panel     Component Value Date/Time   CHOL 117 11/04/2021 1029   TRIG 100 11/04/2021 1029   TRIG 247 (H) 09/12/2013 1137   HDL 34 (L) 11/04/2021 1029   HDL 35 (L) 09/12/2013 1137   CHOLHDL 3.4 11/04/2021 1029   LDLCALC 64 11/04/2021 1029   LDLCALC 115 (H) 09/12/2013 1137     Wt Readings from Last 3 Encounters:  11/06/21 (!) 310 lb (140.6 kg)  11/04/21 (!) 308 lb (139.7 kg)  10/28/21 (!) 312 lb (141.5 kg)      Other studies Reviewed: Additional studies/ records that were reviewed today include: TTE 09/21/21  Review of the above records today demonstrates:   1. Technically difficult study, consider limited echo with contrast to  better evaluation wall motion. Left ventricular ejection fraction, by  estimation, is 30 to 35%. The left ventricle has moderately decreased  function. Left ventricular endocardial  border not optimally defined to evaluate regional wall motion. There is  mild left ventricular hypertrophy. Left ventricular diastolic parameters  are indeterminate.   2. Right ventricular systolic function is normal. The right ventricular  size is mildly enlarged. Tricuspid regurgitation signal is inadequate for  assessing PA pressure.   3. Right atrial size was mildly dilated.   4. The mitral valve is normal in structure. No evidence of mitral valve  regurgitation.   5. The aortic valve was not well visualized. Aortic valve regurgitation  is trivial. No aortic stenosis is present.   6. The inferior vena cava is dilated in size with >50% respiratory  variability, suggesting right atrial pressure of 8 mmHg.   Cardiac monitor 10/26/2021 personally reviewed 1. Predominantly NSR.  2. Atrial fibrillation noted 17% of the time, avg HR  100 bpm.  3. Frequent PVCs, 19.2% of beats.  4. 9 runs of NSVT, longest 16 beats.   ASSESSMENT AND PLAN:  1.  Persistent atrial fibrillation: Currently on dofetilide 125 mcg twice daily, carvedilol 3.125 mg twice daily.  CHA2DS2-VASc of 5.  Also on Eliquis 5 mg twice daily.  High risk medication monitoring for dofetilide.  As she has had more frequent episodes of atrial fibrillation we Lulani Bour stop her dofetilide.  We Khai Arrona load on amiodarone.  2.  Chronic systolic heart failure: Fortunately ejection fraction is normalized.  Continue with plan per primary cardiology.  3.  Coronary artery disease: No current angina  4.  Hypertension: Currently well controlled  5.  Second hypercoagulable state: Currently on Eliquis for atrial fibrillation as above  6.  PVCs: Elevated burden.  Fortunately ejection fraction has remained normal.  Planning to suppress with amiodarone as above.  Current medicines are reviewed at length with the patient today.   The patient has concerns regarding her medicines.  The following changes were made today: Stop Tikosyn, start amiodarone  Labs/ tests ordered today include:  No orders of the defined types were placed in this encounter.    Disposition:   FU 3 months  Signed, Royann Wildasin Meredith Leeds, MD  11/06/2021 8:56 AM     [facility] [address] [place] [address] [phone] (office) [phone] (fax)

## 2021-11-06 NOTE — Patient Instructions (Signed)
Medication Instructions:  Your physician has recommended you make the following change in your medication:  STOP Tikosyn 2.   START Amiodarone  - take 2 tablets (400 mg total) TWICE a day for 2 weeks, then  - take 1 tablet (200 mg total) TWICE a day for 2 weeks, then  - take 1 tablet (200 mg total) ONCE a day   *If you need a refill on your cardiac medications before your next appointment, please call your pharmacy*   Lab Work: None ordered   Testing/Procedures: None ordered   Follow-Up: At [facility], you and your health needs are our priority.  As part of our continuing mission to provide you with exceptional heart care, we have created designated Provider Care Teams.  These Care Teams include your primary Cardiologist (physician) and Advanced Practice Providers (APPs -  Physician Assistants and Nurse Practitioners) who all work together to provide you with the care you need, when you need it.  Your next appointment:   1 month(s)  The format for your next appointment:   In Person  Provider:   You will follow up in the [facility] located at [facility]. Your provider will be: Roderic Palau, NP or Clint R. Fenton, PA-C   Your physician recommends that you schedule a follow-up appointment in: 3 months with an EP APP.     Thank you for choosing [facility]!!   Trinidad Curet, RN [phone]  Other Instructions  Amiodarone Tablets What is this medication? AMIODARONE (a MEE oh da rone) prevents and treats a fast or irregular heartbeat (arrhythmia). It works by slowing down overactive electric signals in the heart, which stabilizes your heart rhythm. It belongs to a group of medications called antiarrhythmics. This medicine may be used for other purposes; ask your health care provider or pharmacist if you have questions. COMMON BRAND NAME(S): Cordarone, Pacerone What should I tell my care team before I take this medication? They need to  know if you have any of these conditions: Liver disease Lung disease Other heart problems Thyroid disease An unusual or allergic reaction to amiodarone, iodine, other medications, foods, dyes, or preservatives Pregnant or trying to get pregnant Breast-feeding How should I use this medication? Take this medication by mouth with a glass of water. Follow the directions on the prescription label. You can take this medication with or without food. However, you should always take it the same way each time. Take your doses at regular intervals. Do not take your medication more often than directed. Do not stop taking except on the advice of your care team. A special MedGuide will be given to you by the pharmacist with each prescription and refill. Be sure to read this information carefully each time. Talk to your care team regarding the use of this medication in children. Special care may be needed. Overdosage: If you think you have taken too much of this medicine contact a poison control center or emergency room at once. NOTE: This medicine is only for you. Do not share this medicine with others. What if I miss a dose? If you miss a dose, take it as soon as you can. If it is almost time for your next dose, take only that dose. Do not take double or extra doses. What may interact with this medication? Do not take this medication with any of the following: Abarelix Apomorphine Arsenic trioxide Certain antibiotics like erythromycin, gemifloxacin, levofloxacin, pentamidine Certain medications for depression like amoxapine, tricyclic antidepressants  Certain medications for fungal infections like fluconazole, itraconazole, ketoconazole, posaconazole, voriconazole Certain medications for irregular heartbeat like disopyramide, dronedarone, ibutilide, propafenone, sotalol Certain medications for malaria like chloroquine,  halofantrine Cisapride Droperidol Haloperidol [place]orn Maprotiline Methadone Phenothiazines like chlorpromazine, mesoridazine, thioridazine Pimozide Ranolazine Red yeast rice Vardenafil This medication may also interact with the following: Antiviral medications for HIV or AIDS Certain medications for blood pressure, heart disease, irregular heart beat Certain medications for cholesterol like atorvastatin, cerivastatin, lovastatin, simvastatin Certain medications for hepatitis C like sofosbuvir and ledipasvir; sofosbuvir Certain medications for seizures like phenytoin Certain medications for thyroid problems Certain medications that treat or prevent blood clots like warfarin Cholestyramine Cimetidine Clopidogrel Cyclosporine Dextromethorphan Diuretics Dofetilide Fentanyl General anesthetics Grapefruit juice Lidocaine Loratadine Methotrexate Other medications that prolong the QT interval (cause an abnormal heart rhythm) Procainamide Quinidine Rifabutin, rifampin, or rifapentine St. John's Wort Trazodone Ziprasidone This list may not describe all possible interactions. Give your health care provider a list of all the medicines, herbs, non-prescription drugs, or dietary supplements you use. Also tell them if you smoke, drink alcohol, or use illegal drugs. Some items may interact with your medicine. What should I watch for while using this medication? Your condition will be monitored closely when you first begin therapy. Often, this medication is first started in a hospital or other monitored health care setting. Once you are on maintenance therapy, visit your care team for regular checks on your progress. Because your condition and use of this medication carry some risk, it is a good idea to carry an identification card, necklace or bracelet with details of your condition, medications, and care team. You may get drowsy or dizzy. Do not drive, use machinery, or do anything that  needs mental alertness until you know how this medication affects you. Do not stand or sit up quickly, especially if you are an older patient. This reduces the risk of dizzy or fainting spells. This medication can make you more sensitive to the sun. Keep out of the sun. If you cannot avoid being in the sun, wear protective clothing and use sunscreen. Do not use sun lamps or tanning beds/booths. You should have regular eye exams before and during treatment. Call your care team if you have blurred vision, see halos, or your eyes become sensitive to light. Your eyes may get dry. It may be helpful to use a lubricating eye solution or artificial tears solution. If you are going to have surgery or a procedure that requires contrast dyes, tell your care team that you are taking this medication. What side effects may I notice from receiving this medication? Side effects that you should report to your care team as soon as possible: Allergic reactions--skin rash, itching, hives, swelling of the face, lips, tongue, or throat Bluish-gray skin Change in vision such as blurry vision, seeing halos around lights, vision loss Heart failure--shortness of breath, swelling of the ankles, feet, or hands, sudden weight gain, unusual weakness or fatigue Heart rhythm changes--fast or irregular heartbeat, dizziness, feeling faint or lightheaded, chest pain, trouble breathing High thyroid levels (hyperthyroidism)--fast or irregular heartbeat, weight loss, excessive sweating or sensitivity to heat, tremors or shaking, anxiety, nervousness, irregular menstrual cycle or spotting Liver injury--right upper belly pain, loss of appetite, nausea, light-colored stool, dark yellow or brown urine, yellowing skin or eyes, unusual weakness or fatigue Low thyroid levels (hypothyroidism)--unusual weakness or fatigue, sensitivity to cold, constipation, hair loss, dry skin, weight gain, feelings of depression Lung injury--shortness of breath or  trouble breathing, cough,  spitting up blood, chest pain, fever Pain, tingling, or numbness in the hands or feet, muscle weakness, trouble walking, loss of balance or coordination Side effects that usually do not require medical attention (report to your care team if they continue or are bothersome): Nausea Vomiting This list may not describe all possible side effects. Call your doctor for medical advice about side effects. You may report side effects to FDA at 1-800-FDA-1088. Where should I keep my medication? Keep out of the reach of children and pets. Store at room temperature between 20 and 25 degrees C (68 and 77 degrees F). Protect from light. Keep container tightly closed. Throw away any unused medication after the expiration date. NOTE: This sheet is a summary. It may not cover all possible information. If you have questions about this medicine, talk to your doctor, pharmacist, or health care provider.  2023 Elsevier/Gold Standard (2007-04-22 00:00:00)   Important Information About Sugar

## 2021-11-08 ENCOUNTER — Other Ambulatory Visit: Payer: Self-pay | Admitting: Family Medicine

## 2021-11-08 DIAGNOSIS — E785 Hyperlipidemia, unspecified: Secondary | ICD-10-CM

## 2021-11-09 ENCOUNTER — Other Ambulatory Visit: Payer: Self-pay | Admitting: Family Medicine

## 2021-11-09 ENCOUNTER — Encounter: Payer: Self-pay | Admitting: Family Medicine

## 2021-11-09 DIAGNOSIS — D72829 Elevated white blood cell count, unspecified: Secondary | ICD-10-CM

## 2021-11-09 LAB — TOXASSURE SELECT 13 (MW), URINE

## 2021-11-09 NOTE — Progress Notes (Signed)
Hello Lorana,  Your lab result is normal and/or stable.Some minor variations that are not significant are commonly marked abnormal, but do not represent any medical problem for you.  Best regards, [name], M.D.

## 2021-11-10 ENCOUNTER — Other Ambulatory Visit (HOSPITAL_COMMUNITY): Payer: Self-pay

## 2021-11-10 ENCOUNTER — Telehealth: Payer: Self-pay

## 2021-11-10 NOTE — Chronic Care Management (AMB) (Signed)
  Chronic Care Management   Note  11/10/2021 Name: SHANEIKA ROSSA MRN: [id number] DOB: 09-29-1955  [name] is a 66 y.o. year old female who is a primary care patient of Stacks, Cletus Gash, MD. I reached out to [name] by phone today in response to a referral sent by Ms. Leone Payor Maday's PCP.  Ms. Jorstad was given information about Chronic Care Management services today including:  CCM service includes personalized support from designated clinical staff supervised by her physician, including individualized plan of care and coordination with other care providers 24/7 contact phone numbers for assistance for urgent and routine care needs. Service will only be billed when office clinical staff spend 20 minutes or more in a month to coordinate care. Only one practitioner may furnish and bill the service in a calendar month. The patient may stop CCM services at any time (effective at the end of the month) by phone call to the office staff. The patient is responsible for co-pay (up to 20% after annual deductible is met) if co-pay is required by the individual health plan.   Patient agreed to services and verbal consent obtained.   Follow up plan: Telephone appointment with care management team member scheduled for:01/01/2022  Noreene Larsson, [place], [place] [address] Direct Dial: [phone] [email or web address] \

## 2021-11-11 ENCOUNTER — Ambulatory Visit: Payer: Medicare Other | Admitting: Cardiology

## 2021-11-11 ENCOUNTER — Other Ambulatory Visit: Payer: Self-pay | Admitting: *Deleted

## 2021-11-11 ENCOUNTER — Ambulatory Visit: Payer: Medicare Other

## 2021-11-11 MED ORDER — NITROGLYCERIN 0.4 MG SL SUBL: 0.4000 mg | SUBLINGUAL_TABLET | SUBLINGUAL | 4 refills | Status: AC | PRN

## 2021-11-12 ENCOUNTER — Telehealth: Payer: Self-pay | Admitting: Hematology and Oncology

## 2021-11-12 NOTE — Telephone Encounter (Signed)
Scheduled appt per 8/28 referral. Pt is aware of appt date and time. Pt is aware to arrive 15 mins prior to appt time and to bring and updated insurance card. Pt is aware of appt location.   

## 2021-11-13 NOTE — Telephone Encounter (Signed)
Advanced Heart Failure Patient Advocate Encounter  Called [employer] to check the status of the patient's application. Representative stated that they have not received the OOP that was sent on 08/29. She advised that we send it again. Sent via efax.

## 2021-11-20 ENCOUNTER — Ambulatory Visit (HOSPITAL_BASED_OUTPATIENT_CLINIC_OR_DEPARTMENT_OTHER): Payer: Medicare Other | Attending: Cardiology | Admitting: Cardiology

## 2021-11-20 VITALS — Ht 66.0 in | Wt 307.0 lb

## 2021-11-20 DIAGNOSIS — G4736 Sleep related hypoventilation in conditions classified elsewhere: Secondary | ICD-10-CM | POA: Insufficient documentation

## 2021-11-20 DIAGNOSIS — G4733 Obstructive sleep apnea (adult) (pediatric): Secondary | ICD-10-CM | POA: Insufficient documentation

## 2021-11-20 NOTE — Telephone Encounter (Signed)
Advanced Heart Failure Patient Advocate Encounter  Called [employer] to check the status of the patient's application. Representative stated that the OOP received was 363.70 and that the total OOP required would be $811.73. Called and left the patient a message. Encouraged her to call back if the other person listed in the household has paid for RXs out of pocket this year. We would need to send in that OOP as well.

## 2021-11-23 NOTE — Procedures (Signed)
   Patient Name: Julia West, Julia West Date: 11/20/2021 Gender: Female D.O.B: Mar 19, 1955 Age (years): 44 Referring Provider: Fransico Him MD, ABSM Height (inches): 66 Interpreting Physician: Fransico Him MD, ABSM Weight (lbs): 307 RPSGT: Zadie Rhine BMI: 50 MRN: [id number] Neck Size: 17.00  CLINICAL INFORMATION The patient is referred for a CPAP titration to treat sleep apnea.  SLEEP STUDY TECHNIQUE As per the AASM Manual for the Scoring of Sleep and Associated Events v2.3 (April 2016) with a hypopnea requiring 4% desaturations.  The channels recorded and monitored were frontal, central and occipital EEG, electrooculogram (EOG), submentalis EMG (chin), nasal and oral airflow, thoracic and abdominal wall motion, anterior tibialis EMG, snore microphone, electrocardiogram, and pulse oximetry. Continuous positive airway pressure (CPAP) was initiated at the beginning of the study and titrated to treat sleep-disordered breathing.  MEDICATIONS Medications self-administered by patient taken the night of the study : Belsoma  TECHNICIAN COMMENTS Comments added by technician: one restroom visted. Patient had difficulty initiating sleep. Patient was restless all through the night. Comments added by scorer: N/A  RESPIRATORY PARAMETERS Optimal PAP Pressure (cm):8 AHI at Optimal Pressure (/hr):0 Overall Minimal O2 (%):85.0  Supine % at Optimal Pressure (%):0 Minimal O2 at Optimal Pressure (%): 86.0   SLEEP ARCHITECTURE The study was initiated at 10:07:48 PM and ended at 4:16:21 AM.  Sleep onset time was 77.3 minutes and the sleep efficiency was 42.1%. The total sleep time was 155 minutes.  The patient spent 4.2% of the night in stage N1 sleep, 68.1% in stage N2 sleep, 27.7% in stage N3 and 0% in REM.Stage REM latency was N/A minutes  Wake after sleep onset was 136.2. Alpha intrusion was absent. Supine sleep was 0.00%.  CARDIAC DATA The 2 lead EKG demonstrated sinus rhythm. The  mean heart rate was 67.2 beats per minute. Other EKG findings include: None.  LEG MOVEMENT DATA The total Periodic Limb Movements of Sleep (PLMS) were 0. The PLMS index was 0.0. A PLMS index of <15 is considered normal in adults.  IMPRESSIONS - The optimal PAP pressure was 8 cm of water. - Moderate oxygen desaturations were observed during this titration (min O2 = 85.0%). - No snoring was audible during this study. - No cardiac abnormalities were observed during this study. - Clinically significant periodic limb movements were not noted during this study. Arousals associated with PLMs were significant.  DIAGNOSIS - Obstructive Sleep Apnea (G47.33) - Nocturnal Hypoxemia  RECOMMENDATIONS - Trial of CPAP therapy on 8 cm H2O with a Small size Resmed Full Face AirFit F20 mask and heated humidification. - Avoid alcohol, sedatives and other CNS depressants that may worsen sleep apnea and disrupt normal sleep architecture. - Sleep hygiene should be reviewed to assess factors that may improve sleep quality. - Weight management and regular exercise should be initiated or continued. - [facility] to [facility] for re-evaluation after 6 weeks of therapy - Overnight pulse ox on CPAP to evaluate for residual hypoxemia.  [Electronically signed] 11/23/2021 01:11 PM  Fransico Him MD, ABSM Diplomate, American Board of Sleep Medicine

## 2021-11-25 ENCOUNTER — Other Ambulatory Visit: Payer: Self-pay | Admitting: Family Medicine

## 2021-11-25 ENCOUNTER — Encounter: Payer: Self-pay | Admitting: Family Medicine

## 2021-11-25 MED ORDER — BELSOMRA 20 MG PO TABS: 20.0000 mg | ORAL_TABLET | Freq: Every day | ORAL | 5 refills | Status: DC

## 2021-11-25 NOTE — Telephone Encounter (Signed)
  Prescription Request  11/25/2021  Is this a "Controlled Substance" medicine? yes  Have you seen your PCP in the last 2 weeks? no  If YES, route message to pool  -  If NO, patient needs to be scheduled for appointment.  What is the name of the medication or equipment? Belsomra 20 mg  Have you contacted your pharmacy to request a refill? yes   Which pharmacy would you like this sent to? [facility]   Patient notified that their request is being sent to the clinical staff for review and that they should receive a response within 2 business days.

## 2021-11-25 NOTE — Telephone Encounter (Signed)
Last OV 11/04/21 Last RF 06/01/21 Next OV NOT sched Please advise

## 2021-11-26 ENCOUNTER — Other Ambulatory Visit: Payer: Self-pay | Admitting: Family Medicine

## 2021-11-26 MED ORDER — TIRZEPATIDE 2.5 MG/0.5ML ~~LOC~~ SOAJ: 2.5000 mg | SUBCUTANEOUS | 0 refills | Status: DC

## 2021-11-27 ENCOUNTER — Encounter: Payer: Self-pay | Admitting: Family Medicine

## 2021-11-27 ENCOUNTER — Telehealth: Payer: Self-pay | Admitting: *Deleted

## 2021-11-27 DIAGNOSIS — G4733 Obstructive sleep apnea (adult) (pediatric): Secondary | ICD-10-CM

## 2021-11-27 DIAGNOSIS — I10 Essential (primary) hypertension: Secondary | ICD-10-CM

## 2021-11-27 DIAGNOSIS — I5022 Chronic systolic (congestive) heart failure: Secondary | ICD-10-CM

## 2021-11-27 NOTE — Telephone Encounter (Addendum)
The patient has been notified of the result and verbalized understanding.  All questions (if any) were answered. Marolyn Hammock, [place] 11/27/2021 1:49 PM    Upon patient request DME selection is [facility]. Patient understands he will be contacted by [facility] to set up his cpap. Patient understands to call if [facility] does not contact him with new setup in a timely manner. Patient understands they will be called once confirmation has been received from [facility]/ that they have received their new machine to schedule 10 week follow up appointment.   [facility] notified of new cpap order  Please add to airview Patient was grateful for the call and thanked me.

## 2021-11-27 NOTE — Telephone Encounter (Signed)
-----   Message from Lauralee Evener, [place] sent at 11/23/2021  1:17 PM EDT -----  ----- Message ----- From: Sueanne Margarita, MD Sent: 11/23/2021   1:13 PM EDT To: Cv Div Sleep Studies  Please let patient know that they had a successful PAP titration and let DME know that orders are in EPIC.  Please set up 6 week OV with me. Need ONO on CPAP

## 2021-11-27 NOTE — Telephone Encounter (Signed)
Pt is checking to see if Belsoma rx has been sent into [facility]

## 2021-11-30 ENCOUNTER — Other Ambulatory Visit: Payer: Self-pay | Admitting: Family Medicine

## 2021-11-30 MED ORDER — BELSOMRA 20 MG PO TABS
20.0000 mg | ORAL_TABLET | Freq: Every day | ORAL | 1 refills | Status: DC
Start: 2021-11-30 — End: 2021-12-03

## 2021-11-30 NOTE — Telephone Encounter (Signed)
I have a [facility] in [place], [place]. Is that correct?

## 2021-11-30 NOTE — Telephone Encounter (Signed)
Belsomra 20 mg went to the wrong pharmacy. Needs to go to [facility]. Please change and call patient when done.

## 2021-12-01 ENCOUNTER — Other Ambulatory Visit: Payer: Self-pay | Admitting: Family Medicine

## 2021-12-02 ENCOUNTER — Inpatient Hospital Stay: Payer: Medicare Other

## 2021-12-02 ENCOUNTER — Other Ambulatory Visit: Payer: Self-pay

## 2021-12-02 ENCOUNTER — Ambulatory Visit (HOSPITAL_COMMUNITY)
Admission: RE | Admit: 2021-12-02 | Discharge: 2021-12-02 | Disposition: A | Discharge status: Home / Self Care | Payer: Medicare Other | Source: Ambulatory Visit | Attending: Nurse Practitioner | Admitting: Nurse Practitioner

## 2021-12-02 ENCOUNTER — Encounter (HOSPITAL_COMMUNITY): Payer: Self-pay | Admitting: Nurse Practitioner

## 2021-12-02 ENCOUNTER — Inpatient Hospital Stay: Payer: Medicare Other | Attending: Hematology and Oncology | Admitting: Hematology and Oncology

## 2021-12-02 VITALS — BP 140/70 | HR 93 | Temp 97.9°F | Wt 310.6 lb

## 2021-12-02 VITALS — BP 146/74 | HR 73 | Ht 66.0 in | Wt 309.8 lb

## 2021-12-02 DIAGNOSIS — I509 Heart failure, unspecified: Secondary | ICD-10-CM | POA: Diagnosis not present

## 2021-12-02 DIAGNOSIS — I110 Hypertensive heart disease with heart failure: Secondary | ICD-10-CM | POA: Insufficient documentation

## 2021-12-02 DIAGNOSIS — I10 Essential (primary) hypertension: Secondary | ICD-10-CM | POA: Insufficient documentation

## 2021-12-02 DIAGNOSIS — K648 Other hemorrhoids: Secondary | ICD-10-CM | POA: Diagnosis not present

## 2021-12-02 DIAGNOSIS — Z87891 Personal history of nicotine dependence: Secondary | ICD-10-CM | POA: Diagnosis not present

## 2021-12-02 DIAGNOSIS — D72820 Lymphocytosis (symptomatic): Secondary | ICD-10-CM | POA: Diagnosis not present

## 2021-12-02 DIAGNOSIS — D6869 Other thrombophilia: Secondary | ICD-10-CM

## 2021-12-02 DIAGNOSIS — I2510 Atherosclerotic heart disease of native coronary artery without angina pectoris: Secondary | ICD-10-CM | POA: Diagnosis not present

## 2021-12-02 DIAGNOSIS — I480 Paroxysmal atrial fibrillation: Secondary | ICD-10-CM | POA: Diagnosis not present

## 2021-12-02 DIAGNOSIS — I4819 Other persistent atrial fibrillation: Secondary | ICD-10-CM | POA: Diagnosis not present

## 2021-12-02 DIAGNOSIS — Z808 Family history of malignant neoplasm of other organs or systems: Secondary | ICD-10-CM | POA: Diagnosis not present

## 2021-12-02 DIAGNOSIS — Z89512 Acquired absence of left leg below knee: Secondary | ICD-10-CM | POA: Diagnosis not present

## 2021-12-02 DIAGNOSIS — Z801 Family history of malignant neoplasm of trachea, bronchus and lung: Secondary | ICD-10-CM | POA: Diagnosis not present

## 2021-12-02 DIAGNOSIS — E669 Obesity, unspecified: Secondary | ICD-10-CM | POA: Diagnosis not present

## 2021-12-02 DIAGNOSIS — Z951 Presence of aortocoronary bypass graft: Secondary | ICD-10-CM | POA: Insufficient documentation

## 2021-12-02 DIAGNOSIS — Z7901 Long term (current) use of anticoagulants: Secondary | ICD-10-CM | POA: Insufficient documentation

## 2021-12-02 DIAGNOSIS — I4891 Unspecified atrial fibrillation: Secondary | ICD-10-CM | POA: Diagnosis not present

## 2021-12-02 DIAGNOSIS — Z79899 Other long term (current) drug therapy: Secondary | ICD-10-CM | POA: Diagnosis not present

## 2021-12-02 DIAGNOSIS — E785 Hyperlipidemia, unspecified: Secondary | ICD-10-CM | POA: Insufficient documentation

## 2021-12-02 DIAGNOSIS — K644 Residual hemorrhoidal skin tags: Secondary | ICD-10-CM

## 2021-12-02 LAB — CMP (CANCER CENTER ONLY)
ALT: 15 U/L (ref 0–44)
AST: 19 U/L (ref 15–41)
Albumin: 3.9 g/dL (ref 3.5–5.0)
Alkaline Phosphatase: 44 U/L (ref 38–126)
Anion gap: 8 (ref 5–15)
BUN: 21 mg/dL (ref 8–23)
CO2: 26 mmol/L (ref 22–32)
Calcium: 8.8 mg/dL — ABNORMAL LOW (ref 8.9–10.3)
Chloride: 109 mmol/L (ref 98–111)
Creatinine: 1.36 mg/dL — ABNORMAL HIGH (ref 0.44–1.00)
GFR, Estimated: 42 mL/min — ABNORMAL LOW (ref >60)
Glucose, Bld: 107 mg/dL — ABNORMAL HIGH (ref 70–99)
Potassium: 4.2 mmol/L (ref 3.5–5.1)
Sodium: 143 mmol/L (ref 135–145)
Total Bilirubin: 0.5 mg/dL (ref 0.3–1.2)
Total Protein: 7.3 g/dL (ref 6.5–8.1)

## 2021-12-02 LAB — CBC
HCT: 48.8 % — ABNORMAL HIGH (ref 36.0–46.0)
Hemoglobin: 15.1 g/dL — ABNORMAL HIGH (ref 12.0–15.0)
MCH: 27.7 pg (ref 26.0–34.0)
MCHC: 30.9 g/dL (ref 30.0–36.0)
MCV: 89.5 fL (ref 80.0–100.0)
Platelets: 267 10*3/uL (ref 150–400)
RBC: 5.45 MIL/uL — ABNORMAL HIGH (ref 3.87–5.11)
RDW: 17.4 % — ABNORMAL HIGH (ref 11.5–15.5)
WBC: 12.5 10*3/uL — ABNORMAL HIGH (ref 4.0–10.5)
nRBC: 0 % (ref 0.0–0.2)

## 2021-12-02 LAB — CBC WITH DIFFERENTIAL (CANCER CENTER ONLY)
Abs Immature Granulocytes: 0.06 10*3/uL (ref 0.00–0.07)
Basophils Absolute: 0.1 10*3/uL (ref 0.0–0.1)
Basophils Relative: 1 %
Eosinophils Absolute: 0.4 10*3/uL (ref 0.0–0.5)
Eosinophils Relative: 3 %
HCT: 46 % (ref 36.0–46.0)
Hemoglobin: 14.7 g/dL (ref 12.0–15.0)
Immature Granulocytes: 0 %
Lymphocytes Relative: 48 %
Lymphs Abs: 7.7 10*3/uL — ABNORMAL HIGH (ref 0.7–4.0)
MCH: 28 pg (ref 26.0–34.0)
MCHC: 32 g/dL (ref 30.0–36.0)
MCV: 87.6 fL (ref 80.0–100.0)
Monocytes Absolute: 0.9 10*3/uL (ref 0.1–1.0)
Monocytes Relative: 6 %
Neutro Abs: 6.5 10*3/uL (ref 1.7–7.7)
Neutrophils Relative %: 42 %
Platelet Count: 275 10*3/uL (ref 150–400)
RBC: 5.25 MIL/uL — ABNORMAL HIGH (ref 3.87–5.11)
RDW: 17.5 % — ABNORMAL HIGH (ref 11.5–15.5)
WBC Count: 15.6 10*3/uL — ABNORMAL HIGH (ref 4.0–10.5)
nRBC: 0 % (ref 0.0–0.2)

## 2021-12-02 LAB — C-REACTIVE PROTEIN: CRP: 1.6 mg/dL — ABNORMAL HIGH (ref <1.0)

## 2021-12-02 LAB — BASIC METABOLIC PANEL
Anion gap: 6 (ref 5–15)
BUN: 16 mg/dL (ref 8–23)
CO2: 27 mmol/L (ref 22–32)
Calcium: 9 mg/dL (ref 8.9–10.3)
Chloride: 106 mmol/L (ref 98–111)
Creatinine, Ser: 1.15 mg/dL — ABNORMAL HIGH (ref 0.44–1.00)
GFR, Estimated: 52 mL/min — ABNORMAL LOW (ref >60)
Glucose, Bld: 96 mg/dL (ref 70–99)
Potassium: 4.9 mmol/L (ref 3.5–5.1)
Sodium: 139 mmol/L (ref 135–145)

## 2021-12-02 LAB — SEDIMENTATION RATE: Sed Rate: 4 mm/hr (ref 0–22)

## 2021-12-02 LAB — LACTATE DEHYDROGENASE: LDH: 154 U/L (ref 98–192)

## 2021-12-02 MED ORDER — AMIODARONE HCL 200 MG PO TABS: ORAL_TABLET | ORAL | 0 refills | Status: DC

## 2021-12-02 NOTE — Progress Notes (Signed)
Primary Care Physician: [name], MD Referring Physician: hospital f/u Cardiologist: Dr. Percival Spanish  EP: Dr. [name] is a 67 y.o. female with a h/o afib on Tikosyn, CAD s/p CABG,  HTN, CAD, HF, morbid obesity that was admitted 7/10 to 7/14 at [facility] for afib with RVR with associated HF symptoms. She was cardioverted and continued anticoagulation with eliquis and received lasix for volume overload. Echo with EF of 30 to 35%, thought to be possible reduction in LV dysfunction from RVR. + urine culture for e. Coli with no symptoms and given 3 day treatment of cephalexin. She was scheduled for f/u in the afib clinic.   Today, she is in in [place] with PVC's. Her fluid weight is stable. She took one lasix for good measure after d/c but weighing daily and has not required any further lasix. She had not been daily weighing, tracking sodium or fluid intake prior to hospital  but is doing so now. She cannot tell when she is in afib. She had f/u with HF transition of care clinic 7/24.    F/u in afib clinic, 12/02/21. Pt unfortunately returned to afib and was seen by Dr. Curt Bears in August with discontinuation of Tikosyn and start of amiodarone. She is here today for one month f/u. She has been taking amiodarone 200 mg 2 in am and 2 in pm since end of August, so will lower today to 200 mg bid and am of cardioversion go to 200 mg daily. No missed anticoagulation.   Today, she denies symptoms of palpitations, chest pain, shortness of breath, orthopnea, PND, lower extremity edema, dizziness, presyncope, syncope, or neurologic sequela. The patient is tolerating medications without difficulties and is otherwise without complaint today.   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    S/p CABG x 2 in 2003   Cancer [facility])    basal cell face   Complication of anesthesia    states low O2 sats post-op 11/13, always slow to awaken   Degenerative joint disease     Dehiscence of amputation stump ([place])    left BKA   Depression    Fatty liver    HTN (hypertension)    Hyperlipidemia LDL goal <70    Incarcerated hernia    Internal and external hemorrhoids without complication    Lymphedema of arm    right   Morbid obesity (HCC)    Rheumatoid arthritis ([place])    Surgical wound, non healing ABDOMINAL    has wound vac @ 125 mm Hg   Ventral hernia    Past Surgical History:  Procedure Laterality Date   AMPUTATION Left 05/06/2017   Procedure: BELOW KNEE AMPUTATION;  Surgeon: Newt Minion, MD;  Location: [place];  Service: Orthopedics;  Laterality: Left;   APPLICATION OF WOUND VAC     BOWEL RESECTION  02/07/2012   Procedure: SMALL BOWEL RESECTION;  Surgeon: Gayland Curry, MD,FACS;  Location: [place];  Service: General;;   CARDIOVASCULAR STRESS TEST  01-17-2012  DR HOCHREIN   LOW RISK NUCLEAR TEST   CARDIOVERSION N/A 07/14/2020   Procedure: CARDIOVERSION;  Surgeon: Arnoldo Lenis, MD;  Location: [facility];  Service: Endoscopy;  Laterality: N/A;   CARDIOVERSION N/A 09/22/2021   Procedure: CARDIOVERSION;  Surgeon: Jerline Pain, MD;  Location: [facility];  Service: Cardiovascular;  Laterality: N/A;   CESAREAN SECTION     x 4 in remote past  CHOLECYSTECTOMY     CORONARY ARTERY BYPASS GRAFT  2003   by Dr. Servando Snare. LIMA to the LAD, free RIMA to the circumflex. Stress perfusion study December 2009 with no high-risk areas of ischemia. She has a well-preserved ejection fraction   HYSTEROSCOPY WITH D & C N/A 05/14/2014   Procedure: DILATATION AND CURETTAGE (no specimen); HYSTEROSCOPY;  Surgeon: Jonnie Kind, MD;  Location: [facility];  Service: Gynecology;  Laterality: N/A;   I & D EXTREMITY Left 06/21/2017   Procedure: IRRIGATION AND DEBRIDEMENT OF LEFT LEG AMPUTATION SITE;  Surgeon: Wallace Going, DO;  Location: [facility];  Service: Plastics;  Laterality: Left;   INCISION AND DRAINAGE OF WOUND  04/17/2012   Procedure: IRRIGATION AND DEBRIDEMENT WOUND;  Surgeon:  Theodoro Kos, DO;  Location: [place];  Service: Plastics;  Laterality: N/A;  OF ABDOMINAL WOUND, SURGICAL PREP AND PLACEMENT OF VAC, REMOVAL FOREHEAD SKIN LESION   INCISION AND DRAINAGE OF WOUND N/A 04/24/2012   Procedure: IRRIGATION AND DEBRIDEMENT WOUND;  Surgeon: Theodoro Kos, DO;  Location: [place];  Service: Plastics;  Laterality: N/A;  I & D ABDOMINAL WOUND WITH VAC AND ACELL   INCISION AND DRAINAGE OF WOUND N/A 05/01/2012   Procedure: IRRIGATION AND DEBRIDEMENT WOUND;  Surgeon: Theodoro Kos, DO;  Location: [place];  Service: Plastics;  Laterality: N/A;  WITH SURGICAL PREP AND PLACEMENT OF VAC   INCISION AND DRAINAGE OF WOUND N/A 05/08/2012   Procedure: IRRIGATION AND DEBRIDEMENT OF ABD WOUND SURGICAL PREP AND PLACEMENT OF VAC ;  Surgeon: Theodoro Kos, DO;  Location: [place];  Service: Plastics;  Laterality: N/A;  IRRIGATION AND DEBRIDEMENT OF ABD WOUND SURGICAL PREP AND PLACEMENT OF VAC    INCISION AND DRAINAGE OF WOUND N/A 05/15/2012   Procedure: IRRIGATION AND DEBRIDEMENT OF ABDOMINAL WOUND WITH POSSIBLE SURGICAL PREP AND PLACEMENT OF VAC;  Surgeon: Theodoro Kos, DO;  Location: [place];  Service: Plastics;  Laterality: N/A;   INCISION AND DRAINAGE OF WOUND N/A 05/22/2012   Procedure: IRRIGATION AND DEBRIDEMENT OF ABODOMINAL WOUND WITH  SURGICAL PREP AND VAC PLACEMENT;  Surgeon: Theodoro Kos, DO;  Location: [place];  Service: Plastics;  Laterality: N/A;   INCISION AND DRAINAGE OF WOUND N/A 06/28/2012   Procedure: IRRIGATION AND DEBRIDEMENT OF ABDOMINAL ULCER SURGICAL PREP AND PLACEMENT OF ACELL AND VAC;  Surgeon: Theodoro Kos, DO;  Location: [place];  Service: Plastics;  Laterality: N/A;   INCISIONAL HERNIA REPAIR  02/07/2012   Procedure: HERNIA REPAIR INCISIONAL;  Surgeon: Gayland Curry, MD,FACS;  Location: [place];  Service: General;;  Open, Primary repair,  strangulated Incisional hernia.   LESION REMOVAL  04/17/2012   Procedure: LESION REMOVAL;  Surgeon: Theodoro Kos, DO;  Location: [place];  Service: Plastics;  Laterality: N/A;   CENTER OF FOREHEAD, REMOVAL FORHEAD SKIN LESION   ORIF TOE FRACTURE Left 02/09/2017   Procedure: EXCISION TALAR HEAD, INTERNAL FIXATION MEDIAL COLUMN LEFT FOOT;  Surgeon: Newt Minion, MD;  Location: [place];  Service: Orthopedics;  Laterality: Left;   POLYPECTOMY N/A 05/14/2014   Procedure: ENDOMETRIAL POLYPECTOMY;  Surgeon: Jonnie Kind, MD;  [facility];  Service: Gynecology;  Laterality: N/A;   STUMP REVISION Left 08/31/2017   Procedure: REVISION LEFT BELOW KNEE AMPUTATION;  Surgeon: Newt Minion, MD;  Location: [place];  Service: Orthopedics;  Laterality: Left;    Current Outpatient Medications  Medication Sig Dispense Refill  acetaminophen (TYLENOL) 325 MG tablet Take 650 mg by mouth 2 (two) times daily as needed (back ache).     amiodarone (PACERONE) 200 MG tablet Take 2 tablets (400 mg total) TWICE a day for 2 weeks, then take 1 tablet (200 mg total) TWICE a day for 2 weeks, then take 1 tablet (200 mg total) ONCE a day 84 tablet 0   [START ON 12/06/2021] amiodarone (PACERONE) 200 MG tablet Take 1 tablet (200 mg total) by mouth daily. 90 tablet 1   apixaban (ELIQUIS) 5 MG TABS tablet TAKE 1 TABLET BY MOUTH TWICE  DAILY 180 tablet 3   atorvastatin (LIPITOR) 20 MG tablet TAKE 1 TABLET BY MOUTH ONCE  DAILY 100 tablet 1   BELSOMRA 20 MG TABS Take 20 mg by mouth at bedtime. 90 tablet 1   carvedilol (COREG) 3.125 MG tablet Take 1 tablet (3.125 mg total) by mouth 2 (two) times daily with a meal. 180 tablet 3   fenofibrate 160 MG tablet TAKE 1 TABLET BY MOUTH DAILY 100 tablet 1   fexofenadine (ALLEGRA) 180 MG tablet Take 1 tablet (180 mg total) by mouth daily. For allergy symptoms 90 tablet 3   fluticasone (FLONASE) 50 MCG/ACT nasal spray Place 1 spray into both nostrils daily as needed for  allergies or rhinitis.     furosemide (LASIX) 20 MG tablet Take 1 tablet (20 mg total) by mouth daily as needed. 30 tablet 6   nitroGLYCERIN (NITROSTAT) 0.4 MG SL tablet Place 1 tablet (0.4 mg total) under the tongue every 5 (five) minutes x 3 doses as needed for chest pain. If pain persist after call 911 25 tablet 4   sertraline (ZOLOFT) 50 MG tablet TAKE 1 TABLET BY MOUTH DAILY 100 tablet 1   spironolactone (ALDACTONE) 25 MG tablet Take 1 tablet (25 mg total) by mouth daily. 90 tablet 3   tirzepatide (MOUNJARO) 2.5 MG/0.5ML Pen Inject 2.5 mg into the skin once a week. 2 mL 0   valsartan (DIOVAN) 320 MG tablet TAKE 1 TABLET BY MOUTH DAILY 90 tablet 1   No current facility-administered medications for this encounter.    Allergies  Allergen Reactions   Ace Inhibitors Cough    Social History   Socioeconomic History   Marital status: Married    Spouse name: Eddie Dibbles   Number of children: 5   Years of education: Not on file   Highest education level: GED or equivalent  Occupational History   Occupation: Estate manager/land agent    Comment: In the [employer]   Occupation: retired  Tobacco Use   Smoking status: Former    Packs/day: 1.00    Years: 25.00    Total pack years: 25.00    Types: Cigarettes    Quit date: 11/17/2011    Years since quitting: 10.0    Passive exposure: Current   Smokeless tobacco: Never  Vaping Use   Vaping Use: Never used  Substance and Sexual Activity   Alcohol use: No   Drug use: No   Sexual activity: Yes    Birth control/protection: Post-menopausal  Other Topics Concern   Not on file  Social History Narrative   Lives in [place] with her husband. 5 children. One of whom has pulmonary stenosis.    Left BKA, uses crutches - doesn't get much exercise   Social Determinants of Health   Financial Resource Strain: Low Risk  (09/22/2021)   Overall Financial Resource Strain (CARDIA)    Difficulty of Paying Living Expenses: Not very hard  Food Insecurity: No Food  Insecurity (09/22/2021)   Hunger Vital Sign    Worried About Running Out of Food in the Last Year: Never true    Ran Out of Food in the Last Year: Never true  Transportation Needs: No Transportation Needs (09/22/2021)   PRAPARE - Hydrologist (Medical): No    Lack of Transportation (Non-Medical): No  Physical Activity: Insufficiently Active (10/01/2020)   Exercise Vital Sign    Days of Exercise per Week: 7 days    Minutes of Exercise per Session: 10 min  Stress: No Stress Concern Present (10/01/2020)   [place]    Feeling of Stress : Not at all  Social Connections: Moderately Isolated (10/01/2020)   Social Connection and Isolation Panel [NHANES]    Frequency of Communication with Friends and Family: More than three times a week    Frequency of Social Gatherings with Friends and Family: Twice a week    Attends Religious Services: Never    Marine scientist or Organizations: No    Attends Archivist Meetings: Never    Marital Status: Married  Human resources officer Violence: Not At Risk (10/01/2020)   Humiliation, Afraid, Rape, and Kick questionnaire    Fear of Current or Ex-Partner: No    Emotionally Abused: No    Physically Abused: No    Sexually Abused: No    Family History  Problem Relation Age of Onset   Heart attack Mother    Hypertension Mother    Heart disease Mother    Sudden death Mother    Obesity Mother    Hypertension Sister    Diabetes Brother    Birth defects Son        heart defect    ROS- All systems are reviewed and negative except as per the HPI above  Physical Exam: There were no vitals filed for this visit. Wt Readings from Last 3 Encounters:  11/20/21 (!) 139.3 kg  11/06/21 (!) 140.6 kg  11/04/21 (!) 139.7 kg    Labs: Lab Results  Component Value Date   NA 141 11/04/2021   K 4.6 11/04/2021   CL 102 11/04/2021   CO2 23 11/04/2021   GLUCOSE  87 11/04/2021   BUN 19 11/04/2021   CREATININE 1.05 (H) 11/04/2021   CALCIUM 9.6 11/04/2021   MG 2.2 09/24/2021   Lab Results  Component Value Date   INR 1.3 (H) 09/22/2021   Lab Results  Component Value Date   CHOL 117 11/04/2021   HDL 34 (L) 11/04/2021   LDLCALC 64 11/04/2021   TRIG 100 11/04/2021     GEN- The patient is well appearing, alert and oriented x 3 today.   Head- normocephalic, atraumatic Eyes-  Sclera clear, conjunctiva pink Ears- hearing intact Oropharynx- clear Neck- supple, no JVP Lymph- no cervical lymphadenopathy Lungs- Clear to ausculation bilaterally, normal work of breathing Heart-Irregular rate(Pvc's) and rhythm, no murmurs, rubs or gallops, PMI not laterally displaced GI- soft, NT, ND, + BS Extremities- no clubbing, cyanosis, or edema MS- no significant deformity or atrophy Skin- no rash or lesion Psych- euthymic mood, full affect Neuro- strength and sensation are intact   Echo- Vent. rate 73 BPM PR interval * ms QRS duration 102 ms QT/QTcB 394/434 ms P-R-T axes * -59 68 Atrial fibrillation Left axis deviation Septal infarct , age undetermined Abnormal ECG When compared with ECG of 05-Oct-2021 10:08, PREVIOUS ECG IS PRESENT  Assessment and Plan:  1.  Persistent Afib Tikosyn stopped and washed out and now has been loading on amiodrone As of today, go to amiodarone 200 mg bid and then am of cardioversion go to 200 mg daily  She will cardioverted 9/26 Risk vrs benefit of CV discussed Continue carvedilol 3.125 mg bid  Cbc/bmet  2. Acute on chronic heart failure Normovolemic today  Weigh  daily Avoid salt, limit fluids  Lasix as needed  Spironolactone 12.5 mg daily   3. HTN Stable  Continue current meds   4. CAD No anginal symptoms   5. [id number]  score of at least 5 States compliance with eliquis 5 mg bid, no missed doses for at least 3 weeks   I will see back one week after cardioversion    Butch Penny C. Ibn Stief,  [facility] [address] [place], [place] [address] [phone]

## 2021-12-02 NOTE — Patient Instructions (Signed)
Decrease Amiodarone '200mg'$  (1 tablet) to twice a day until Tuesday (9/26) then reduce to '200mg'$  (1 tablet) once a day    Cardioversion scheduled for Tuesday, September 26th  - Arrive at the [facility] and go to admitting at 730am  - Do not eat or drink anything after midnight the night prior to your procedure.  - Take all your morning medication (except diabetic medications) with a sip of water prior to arrival.  - You will not be able to drive home after your procedure.  - Do NOT miss any doses of your blood thinner - if you should miss a dose please notify our office immediately.  - If you feel as if you go back into normal rhythm prior to scheduled cardioversion, please notify our office immediately. If your procedure is canceled in the cardioversion suite you will be charged a cancellation fee.

## 2021-12-02 NOTE — H&P (View-Only) (Signed)
Primary Care Physician: [name], MD Referring Physician: hospital f/u Cardiologist: Dr. Percival Spanish  EP: Dr. [name] is a 66 y.o. female with a h/o afib on Tikosyn, CAD s/p CABG,  HTN, CAD, HF, morbid obesity that was admitted 7/10 to 7/14 at [facility] for afib with RVR with associated HF symptoms. She was cardioverted and continued anticoagulation with eliquis and received lasix for volume overload. Echo with EF of 30 to 35%, thought to be possible reduction in LV dysfunction from RVR. + urine culture for e. Coli with no symptoms and given 3 day treatment of cephalexin. She was scheduled for f/u in the afib clinic.   Today, she is in in [place] with PVC's. Her fluid weight is stable. She took one lasix for good measure after d/c but weighing daily and has not required any further lasix. She had not been daily weighing, tracking sodium or fluid intake prior to hospital  but is doing so now. She cannot tell when she is in afib. She had f/u with HF transition of care clinic 7/24.    F/u in afib clinic, 12/02/21. Pt unfortunately returned to afib and was seen by Dr. Curt Bears in August with discontinuation of Tikosyn and start of amiodarone. She is here today for one month f/u. She has been taking amiodarone 200 mg 2 in am and 2 in pm since end of August, so will lower today to 200 mg bid and am of cardioversion go to 200 mg daily. No missed anticoagulation.   Today, she denies symptoms of palpitations, chest pain, shortness of breath, orthopnea, PND, lower extremity edema, dizziness, presyncope, syncope, or neurologic sequela. The patient is tolerating medications without difficulties and is otherwise without complaint today.   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    S/p CABG x 2 in 2003   Cancer [facility])    basal cell face   Complication of anesthesia    states low O2 sats post-op 11/13, always slow to awaken   Degenerative joint disease     Dehiscence of amputation stump ([place])    left BKA   Depression    Fatty liver    HTN (hypertension)    Hyperlipidemia LDL goal <70    Incarcerated hernia    Internal and external hemorrhoids without complication    Lymphedema of arm    right   Morbid obesity (HCC)    Rheumatoid arthritis ([place])    Surgical wound, non healing ABDOMINAL    has wound vac @ 125 mm Hg   Ventral hernia    Past Surgical History:  Procedure Laterality Date   AMPUTATION Left 05/06/2017   Procedure: BELOW KNEE AMPUTATION;  Surgeon: Newt Minion, MD;  Location: [place];  Service: Orthopedics;  Laterality: Left;   APPLICATION OF WOUND VAC     BOWEL RESECTION  02/07/2012   Procedure: SMALL BOWEL RESECTION;  Surgeon: Gayland Curry, MD,FACS;  Location: [place];  Service: General;;   CARDIOVASCULAR STRESS TEST  01-17-2012  DR HOCHREIN   LOW RISK NUCLEAR TEST   CARDIOVERSION N/A 07/14/2020   Procedure: CARDIOVERSION;  Surgeon: Arnoldo Lenis, MD;  Location: [facility];  Service: Endoscopy;  Laterality: N/A;   CARDIOVERSION N/A 09/22/2021   Procedure: CARDIOVERSION;  Surgeon: Jerline Pain, MD;  [facility]: [facility];  Service: Cardiovascular;  Laterality: N/A;   CESAREAN SECTION     x 4 in remote past  CHOLECYSTECTOMY     CORONARY ARTERY BYPASS GRAFT  2003   by Dr. Servando Snare. LIMA to the LAD, free RIMA to the circumflex. Stress perfusion study December 2009 with no high-risk areas of ischemia. She has a well-preserved ejection fraction   HYSTEROSCOPY WITH D & C N/A 05/14/2014   Procedure: DILATATION AND CURETTAGE (no specimen); HYSTEROSCOPY;  Surgeon: Jonnie Kind, MD;  Location: [facility];  Service: Gynecology;  Laterality: N/A;   I & D EXTREMITY Left 06/21/2017   Procedure: IRRIGATION AND DEBRIDEMENT OF LEFT LEG AMPUTATION SITE;  Surgeon: Wallace Going, DO;  Location: [facility];  Service: Plastics;  Laterality: Left;   INCISION AND DRAINAGE OF WOUND  04/17/2012   Procedure: IRRIGATION AND DEBRIDEMENT WOUND;  Surgeon:  Theodoro Kos, DO;  Location: [place];  Service: Plastics;  Laterality: N/A;  OF ABDOMINAL WOUND, SURGICAL PREP AND PLACEMENT OF VAC, REMOVAL FOREHEAD SKIN LESION   INCISION AND DRAINAGE OF WOUND N/A 04/24/2012   Procedure: IRRIGATION AND DEBRIDEMENT WOUND;  Surgeon: Theodoro Kos, DO;  Location: [place];  Service: Plastics;  Laterality: N/A;  I & D ABDOMINAL WOUND WITH VAC AND ACELL   INCISION AND DRAINAGE OF WOUND N/A 05/01/2012   Procedure: IRRIGATION AND DEBRIDEMENT WOUND;  Surgeon: Theodoro Kos, DO;  Location: [place];  Service: Plastics;  Laterality: N/A;  WITH SURGICAL PREP AND PLACEMENT OF VAC   INCISION AND DRAINAGE OF WOUND N/A 05/08/2012   Procedure: IRRIGATION AND DEBRIDEMENT OF ABD WOUND SURGICAL PREP AND PLACEMENT OF VAC ;  Surgeon: Theodoro Kos, DO;  Location: [place];  Service: Plastics;  Laterality: N/A;  IRRIGATION AND DEBRIDEMENT OF ABD WOUND SURGICAL PREP AND PLACEMENT OF VAC    INCISION AND DRAINAGE OF WOUND N/A 05/15/2012   Procedure: IRRIGATION AND DEBRIDEMENT OF ABDOMINAL WOUND WITH POSSIBLE SURGICAL PREP AND PLACEMENT OF VAC;  Surgeon: Theodoro Kos, DO;  Location: [place];  Service: Plastics;  Laterality: N/A;   INCISION AND DRAINAGE OF WOUND N/A 05/22/2012   Procedure: IRRIGATION AND DEBRIDEMENT OF ABODOMINAL WOUND WITH  SURGICAL PREP AND VAC PLACEMENT;  Surgeon: Theodoro Kos, DO;  Location: [place];  Service: Plastics;  Laterality: N/A;   INCISION AND DRAINAGE OF WOUND N/A 06/28/2012   Procedure: IRRIGATION AND DEBRIDEMENT OF ABDOMINAL ULCER SURGICAL PREP AND PLACEMENT OF ACELL AND VAC;  Surgeon: Theodoro Kos, DO;  Location: [place];  Service: Plastics;  Laterality: N/A;   INCISIONAL HERNIA REPAIR  02/07/2012   Procedure: HERNIA REPAIR INCISIONAL;  Surgeon: Gayland Curry, MD,FACS;  Location: [place];  Service: General;;  Open, Primary repair,  strangulated Incisional hernia.   LESION REMOVAL  04/17/2012   Procedure: LESION REMOVAL;  Surgeon: Theodoro Kos, DO;  Location: [place];  Service: Plastics;  Laterality: N/A;   CENTER OF FOREHEAD, REMOVAL FORHEAD SKIN LESION   ORIF TOE FRACTURE Left 02/09/2017   Procedure: EXCISION TALAR HEAD, INTERNAL FIXATION MEDIAL COLUMN LEFT FOOT;  Surgeon: Newt Minion, MD;  Location: [place];  Service: Orthopedics;  Laterality: Left;   POLYPECTOMY N/A 05/14/2014   Procedure: ENDOMETRIAL POLYPECTOMY;  Surgeon: Jonnie Kind, MD;  [facility];  Service: Gynecology;  Laterality: N/A;   STUMP REVISION Left 08/31/2017   Procedure: REVISION LEFT BELOW KNEE AMPUTATION;  Surgeon: Newt Minion, MD;  Location: [place];  Service: Orthopedics;  Laterality: Left;    Current Outpatient Medications  Medication Sig Dispense Refill  acetaminophen (TYLENOL) 325 MG tablet Take 650 mg by mouth 2 (two) times daily as needed (back ache).     amiodarone (PACERONE) 200 MG tablet Take 2 tablets (400 mg total) TWICE a day for 2 weeks, then take 1 tablet (200 mg total) TWICE a day for 2 weeks, then take 1 tablet (200 mg total) ONCE a day 84 tablet 0   [START ON 12/06/2021] amiodarone (PACERONE) 200 MG tablet Take 1 tablet (200 mg total) by mouth daily. 90 tablet 1   apixaban (ELIQUIS) 5 MG TABS tablet TAKE 1 TABLET BY MOUTH TWICE  DAILY 180 tablet 3   atorvastatin (LIPITOR) 20 MG tablet TAKE 1 TABLET BY MOUTH ONCE  DAILY 100 tablet 1   BELSOMRA 20 MG TABS Take 20 mg by mouth at bedtime. 90 tablet 1   carvedilol (COREG) 3.125 MG tablet Take 1 tablet (3.125 mg total) by mouth 2 (two) times daily with a meal. 180 tablet 3   fenofibrate 160 MG tablet TAKE 1 TABLET BY MOUTH DAILY 100 tablet 1   fexofenadine (ALLEGRA) 180 MG tablet Take 1 tablet (180 mg total) by mouth daily. For allergy symptoms 90 tablet 3   fluticasone (FLONASE) 50 MCG/ACT nasal spray Place 1 spray into both nostrils daily as needed for  allergies or rhinitis.     furosemide (LASIX) 20 MG tablet Take 1 tablet (20 mg total) by mouth daily as needed. 30 tablet 6   nitroGLYCERIN (NITROSTAT) 0.4 MG SL tablet Place 1 tablet (0.4 mg total) under the tongue every 5 (five) minutes x 3 doses as needed for chest pain. If pain persist after call 911 25 tablet 4   sertraline (ZOLOFT) 50 MG tablet TAKE 1 TABLET BY MOUTH DAILY 100 tablet 1   spironolactone (ALDACTONE) 25 MG tablet Take 1 tablet (25 mg total) by mouth daily. 90 tablet 3   tirzepatide (MOUNJARO) 2.5 MG/0.5ML Pen Inject 2.5 mg into the skin once a week. 2 mL 0   valsartan (DIOVAN) 320 MG tablet TAKE 1 TABLET BY MOUTH DAILY 90 tablet 1   No current facility-administered medications for this encounter.    Allergies  Allergen Reactions   Ace Inhibitors Cough    Social History   Socioeconomic History   Marital status: Married    Spouse name: Eddie Dibbles   Number of children: 5   Years of education: Not on file   Highest education level: GED or equivalent  Occupational History   Occupation: Estate manager/land agent    Comment: In the [employer]   Occupation: retired  Tobacco Use   Smoking status: Former    Packs/day: 1.00    Years: 25.00    Total pack years: 25.00    Types: Cigarettes    Quit date: 11/17/2011    Years since quitting: 10.0    Passive exposure: Current   Smokeless tobacco: Never  Vaping Use   Vaping Use: Never used  Substance and Sexual Activity   Alcohol use: No   Drug use: No   Sexual activity: Yes    Birth control/protection: Post-menopausal  Other Topics Concern   Not on file  Social History Narrative   Lives in [place] with her husband. 5 children. One of whom has pulmonary stenosis.    Left BKA, uses crutches - doesn't get much exercise   Social Determinants of Health   Financial Resource Strain: Low Risk  (09/22/2021)   Overall Financial Resource Strain (CARDIA)    Difficulty of Paying Living Expenses: Not very hard  Food Insecurity: No Food  Insecurity (09/22/2021)   Hunger Vital Sign    Worried About Running Out of Food in the Last Year: Never true    Ran Out of Food in the Last Year: Never true  Transportation Needs: No Transportation Needs (09/22/2021)   PRAPARE - Hydrologist (Medical): No    Lack of Transportation (Non-Medical): No  Physical Activity: Insufficiently Active (10/01/2020)   Exercise Vital Sign    Days of Exercise per Week: 7 days    Minutes of Exercise per Session: 10 min  Stress: No Stress Concern Present (10/01/2020)   [place]    Feeling of Stress : Not at all  Social Connections: Moderately Isolated (10/01/2020)   Social Connection and Isolation Panel [NHANES]    Frequency of Communication with Friends and Family: More than three times a week    Frequency of Social Gatherings with Friends and Family: Twice a week    Attends Religious Services: Never    Marine scientist or Organizations: No    Attends Archivist Meetings: Never    Marital Status: Married  Human resources officer Violence: Not At Risk (10/01/2020)   Humiliation, Afraid, Rape, and Kick questionnaire    Fear of Current or Ex-Partner: No    Emotionally Abused: No    Physically Abused: No    Sexually Abused: No    Family History  Problem Relation Age of Onset   Heart attack Mother    Hypertension Mother    Heart disease Mother    Sudden death Mother    Obesity Mother    Hypertension Sister    Diabetes Brother    Birth defects Son        heart defect    ROS- All systems are reviewed and negative except as per the HPI above  Physical Exam: There were no vitals filed for this visit. Wt Readings from Last 3 Encounters:  11/20/21 (!) 139.3 kg  11/06/21 (!) 140.6 kg  11/04/21 (!) 139.7 kg    Labs: Lab Results  Component Value Date   NA 141 11/04/2021   K 4.6 11/04/2021   CL 102 11/04/2021   CO2 23 11/04/2021   GLUCOSE  87 11/04/2021   BUN 19 11/04/2021   CREATININE 1.05 (H) 11/04/2021   CALCIUM 9.6 11/04/2021   MG 2.2 09/24/2021   Lab Results  Component Value Date   INR 1.3 (H) 09/22/2021   Lab Results  Component Value Date   CHOL 117 11/04/2021   HDL 34 (L) 11/04/2021   LDLCALC 64 11/04/2021   TRIG 100 11/04/2021     GEN- The patient is well appearing, alert and oriented x 3 today.   Head- normocephalic, atraumatic Eyes-  Sclera clear, conjunctiva pink Ears- hearing intact Oropharynx- clear Neck- supple, no JVP Lymph- no cervical lymphadenopathy Lungs- Clear to ausculation bilaterally, normal work of breathing Heart-Irregular rate(Pvc's) and rhythm, no murmurs, rubs or gallops, PMI not laterally displaced GI- soft, NT, ND, + BS Extremities- no clubbing, cyanosis, or edema MS- no significant deformity or atrophy Skin- no rash or lesion Psych- euthymic mood, full affect Neuro- strength and sensation are intact   Echo- Vent. rate 73 BPM PR interval * ms QRS duration 102 ms QT/QTcB 394/434 ms P-R-T axes * -59 68 Atrial fibrillation Left axis deviation Septal infarct , age undetermined Abnormal ECG When compared with ECG of 05-Oct-2021 10:08, PREVIOUS ECG IS PRESENT  Assessment and Plan:  1.  Persistent Afib Tikosyn stopped and washed out and now has been loading on amiodrone As of today, go to amiodarone 200 mg bid and then am of cardioversion go to 200 mg daily  She will cardioverted 9/26 Risk vrs benefit of CV discussed Continue carvedilol 3.125 mg bid  Cbc/bmet  2. Acute on chronic heart failure Normovolemic today  Weigh  daily Avoid salt, limit fluids  Lasix as needed  Spironolactone 12.5 mg daily   3. HTN Stable  Continue current meds   4. CAD No anginal symptoms   5. [id number]  score of at least 5 States compliance with eliquis 5 mg bid, no missed doses for at least 3 weeks   I will see back one week after cardioversion    Butch Penny C. Adrienne Delay,  [facility] [address] [place], [place] [address] [phone]

## 2021-12-03 ENCOUNTER — Other Ambulatory Visit: Payer: Self-pay | Admitting: Pharmacist

## 2021-12-03 DIAGNOSIS — F5101 Primary insomnia: Secondary | ICD-10-CM

## 2021-12-03 LAB — ERYTHROPOIETIN: Erythropoietin: 16.9 m[IU]/mL (ref 2.6–18.5)

## 2021-12-03 LAB — SURGICAL PATHOLOGY

## 2021-12-03 MED ORDER — BELSOMRA 20 MG PO TABS: 20.0000 mg | ORAL_TABLET | Freq: Every day | ORAL | 4 refills | Status: DC

## 2021-12-03 NOTE — Telephone Encounter (Signed)
NEED TO ESCRIBE BELSOMRA TO [facility] ([place]

## 2021-12-07 ENCOUNTER — Telehealth: Payer: Self-pay | Admitting: *Deleted

## 2021-12-07 LAB — FLOW CYTOMETRY

## 2021-12-07 NOTE — Telephone Encounter (Signed)
Received vm message from pt's dfaughter inquiring about lab results from visit on 12/02/21  Please advise.

## 2021-12-08 ENCOUNTER — Ambulatory Visit (HOSPITAL_COMMUNITY): Admission: RE | Admit: 2021-12-08 | Payer: Medicare Other | Source: Home / Self Care | Admitting: Cardiology

## 2021-12-08 ENCOUNTER — Encounter (HOSPITAL_COMMUNITY): Admission: RE | Payer: Self-pay | Source: Home / Self Care

## 2021-12-08 SURGERY — CARDIOVERSION
Anesthesia: General

## 2021-12-09 ENCOUNTER — Telehealth: Payer: Self-pay | Admitting: *Deleted

## 2021-12-09 ENCOUNTER — Telehealth: Payer: Medicare Other

## 2021-12-09 ENCOUNTER — Ambulatory Visit: Payer: Medicare Other | Admitting: Cardiology

## 2021-12-09 ENCOUNTER — Encounter: Payer: Self-pay | Admitting: Hematology and Oncology

## 2021-12-09 NOTE — Telephone Encounter (Signed)
Received call from pt's daughter regarding recent lab results. Spoke with Dr. Lorenso Courier. 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 Pt does not have CLL. Dr. Lorenso Courier also believes the pt's OSA is playing a large role in her elevated WBCs. Daughter states pt is getting her CPAP device tomorrow and will then start using it. Advised that Dr. Lorenso Courier recommneds f/u in 6 months. Daughter prefers labs in 3 months.  Will send scheduling message for labs in 3 months and MD and labs in 6 months. Advised to call back with any further questions or concerns. Daughter voiced understanding.

## 2021-12-09 NOTE — Progress Notes (Signed)
[place] Telephone:([phone]) [phone]   Fax:([phone]) [place] NOTE  Patient Care Team: [name], MD as PCP - General (Family Medicine) [name], MD as PCP - Cardiology (Cardiology) Constance Haw, MD as PCP - Electrophysiology (Cardiology) Lavera Guise, [facility] as Pharmacist (Family Medicine)  Hematological/Oncological History # Leukocytosis-Mixed Lymphocytosis/Neutrophilia 11/04/21: WBC 14.6, Hgb 14.8, MCV 85, Plt 290 12/02/2021: establish care with Dr. Lorenso Courier   CHIEF COMPLAINTS/PURPOSE OF CONSULTATION:  "Leukocytosis "  HISTORY OF PRESENTING ILLNESS:  Julia West 66 y.o. female with medical history significant for CAD, left BKA, liver, hypertension, hyperlipidemia, internal/external hemorrhoids, and obesity who presents for evaluation of leukocytosis.  On review of the previous records Julia West has longstanding leukocytosis dating back to at least 02/07/2012.  At that time she had a white blood cell count of 18.  It has been persistently elevated with rare exceptions.  Most recently on 11/04/2021 patient had labs collected which showed WBC 14.6, Hgb 14.8, MCV 85, Plt 290.  Due to concern for these findings she was referred to hematology for further evaluation and management.  On exam today Julia West reports that she developed atrial fibrillation last year and underwent cardioversion.  She notes that she also had a staph infection in her stump/wound.  She reports that her cardiologist is currently planning a third electrocardioversion though he wanted to have her evaluated further prior to that procedure.  Patient notes that she was recently diagnosed with sleep apnea and is waiting on the machine.  She notes that she does have issues with snoring and daytime somnolence.  She currently denies any infectious symptoms such as fevers, chills, sweats, nausea, vomiting or diarrhea.  She denies any lymphadenopathy or early  satiety.  On further discussion she notes that she is a former smoker having smoked for 25 years.  She smoked on and off with her last smoking back in 2020 "before my amputation".  She does that she does not vape.  She does drink alcohol occasionally.  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  Her family history is remarkable for CHF in her mother, suicide in her father, and testicular cancer in her son.  She had a sister with lung cancer.  She is otherwise had no recent changes in her health.  A full 10 point ROS was otherwise negative.  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    S/p CABG x 2 in 2003   Cancer [facility])    basal cell face   Complication of anesthesia    states low O2 sats post-op 11/13, always slow to awaken   Degenerative joint disease    Dehiscence of amputation stump ([place])    left BKA   Depression    Fatty liver    HTN (hypertension)    Hyperlipidemia LDL goal <70    Incarcerated hernia    Internal and external hemorrhoids without complication    Lymphedema of arm    right   Morbid obesity ([place])    Rheumatoid arthritis ([place])    Surgical wound, non healing ABDOMINAL    has wound vac @ 125 mm Hg   Ventral hernia     SURGICAL HISTORY: Past Surgical History:  Procedure Laterality Date   AMPUTATION Left 05/06/2017   Procedure: BELOW KNEE AMPUTATION;  Surgeon: Newt Minion, MD;  Location: [place];  Service: Orthopedics;  Laterality: Left;  APPLICATION OF WOUND VAC     BOWEL RESECTION  02/07/2012   Procedure: SMALL BOWEL RESECTION;  Surgeon: Gayland Curry, MD,FACS;  Location: [place];  Service: General;;   CARDIOVASCULAR STRESS TEST  01-17-2012  DR HOCHREIN   LOW RISK NUCLEAR TEST   CARDIOVERSION N/A 07/14/2020   Procedure: CARDIOVERSION;  Surgeon: Arnoldo Lenis, MD;  [facility]: [facility];  Service: Endoscopy;  Laterality: N/A;   CARDIOVERSION N/A 09/22/2021   Procedure: CARDIOVERSION;  Surgeon:  Jerline Pain, MD;  [facility];  Service: Cardiovascular;  Laterality: N/A;   CESAREAN SECTION     x 4 in remote past   [place]  2003   by Dr. Servando Snare. LIMA to the LAD, free RIMA to the circumflex. Stress perfusion study December 2009 with no high-risk areas of ischemia. She has a well-preserved ejection fraction   HYSTEROSCOPY WITH D & C N/A 05/14/2014   Procedure: DILATATION AND CURETTAGE (no specimen); HYSTEROSCOPY;  Surgeon: Jonnie Kind, MD;  Location: [facility];  Service: Gynecology;  Laterality: N/A;   I & D EXTREMITY Left 06/21/2017   Procedure: IRRIGATION AND DEBRIDEMENT OF LEFT LEG AMPUTATION SITE;  Surgeon: Wallace Going, DO;  Location: [facility];  Service: Plastics;  Laterality: Left;   INCISION AND DRAINAGE OF WOUND  04/17/2012   Procedure: IRRIGATION AND DEBRIDEMENT WOUND;  Surgeon: Theodoro Kos, DO;  Location: [place];  Service: Plastics;  Laterality: N/A;  OF ABDOMINAL WOUND, SURGICAL PREP AND PLACEMENT OF VAC, REMOVAL FOREHEAD SKIN LESION   INCISION AND DRAINAGE OF WOUND N/A 04/24/2012   Procedure: IRRIGATION AND DEBRIDEMENT WOUND;  Surgeon: Theodoro Kos, DO;  Location: [place];  Service: Plastics;  Laterality: N/A;  I & D ABDOMINAL WOUND WITH VAC AND ACELL   INCISION AND DRAINAGE OF WOUND N/A 05/01/2012   Procedure: IRRIGATION AND DEBRIDEMENT WOUND;  Surgeon: Theodoro Kos, DO;  Location: [place];  Service: Plastics;  Laterality: N/A;  WITH SURGICAL PREP AND PLACEMENT OF VAC   INCISION AND DRAINAGE OF WOUND N/A 05/08/2012   Procedure: IRRIGATION AND DEBRIDEMENT OF ABD WOUND SURGICAL PREP AND PLACEMENT OF VAC ;  Surgeon: Theodoro Kos, DO;  Location: [place];  Service: Plastics;  Laterality: N/A;  IRRIGATION AND DEBRIDEMENT OF ABD WOUND SURGICAL PREP AND PLACEMENT OF VAC    INCISION AND DRAINAGE OF WOUND N/A 05/15/2012   Procedure: IRRIGATION AND  DEBRIDEMENT OF ABDOMINAL WOUND WITH POSSIBLE SURGICAL PREP AND PLACEMENT OF VAC;  Surgeon: Theodoro Kos, DO;  Location: [place];  Service: Plastics;  Laterality: N/A;   INCISION AND DRAINAGE OF WOUND N/A 05/22/2012   Procedure: IRRIGATION AND DEBRIDEMENT OF ABODOMINAL WOUND WITH  SURGICAL PREP AND VAC PLACEMENT;  Surgeon: Theodoro Kos, DO;  Location: [place];  Service: Plastics;  Laterality: N/A;   INCISION AND DRAINAGE OF WOUND N/A 06/28/2012   Procedure: IRRIGATION AND DEBRIDEMENT OF ABDOMINAL ULCER SURGICAL PREP AND PLACEMENT OF ACELL AND VAC;  Surgeon: Theodoro Kos, DO;  Location: [place];  Service: Plastics;  Laterality: N/A;   INCISIONAL HERNIA REPAIR  02/07/2012   Procedure: HERNIA REPAIR INCISIONAL;  Surgeon: Gayland Curry, MD,FACS;  Location: [place];  Service: General;;  Open, Primary repair, strangulated Incisional hernia.   LESION REMOVAL  04/17/2012   Procedure: LESION REMOVAL;  Surgeon: Theodoro Kos, DO;  Location: [place];  Service: Plastics;  Laterality:  N/A;   CENTER OF FOREHEAD, REMOVAL FORHEAD SKIN LESION   ORIF TOE FRACTURE Left 02/09/2017   Procedure: EXCISION TALAR HEAD, INTERNAL FIXATION MEDIAL COLUMN LEFT FOOT;  Surgeon: Newt Minion, MD;  Location: [place];  Service: Orthopedics;  Laterality: Left;   POLYPECTOMY N/A 05/14/2014   Procedure: ENDOMETRIAL POLYPECTOMY;  Surgeon: Jonnie Kind, MD;  [facility];  Service: Gynecology;  Laterality: N/A;   STUMP REVISION Left 08/31/2017   Procedure: REVISION LEFT BELOW KNEE AMPUTATION;  Surgeon: Newt Minion, MD;  Location: [place];  Service: Orthopedics;  Laterality: Left;    SOCIAL HISTORY: Social History   Socioeconomic History   Marital status: Married    Spouse name: Eddie Dibbles   Number of children: 5   Years of education: Not on file   Highest education level: GED or equivalent  Occupational History   Occupation: Estate manager/land agent    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 5 children. One of whom has pulmonary stenosis.    Left BKA, uses crutches - doesn't get much exercise   Social Determinants of Health   Financial Resource Strain: Low Risk  (09/22/2021)   Overall Financial Resource Strain (CARDIA)    Difficulty of Paying Living Expenses: Not very hard  Food Insecurity: No Food Insecurity (09/22/2021)   Hunger Vital Sign    Worried About Running Out of Food in the Last Year: Never true    Ran Out of Food in the Last Year: Never true  Transportation Needs: No Transportation Needs (09/22/2021)   PRAPARE - Hydrologist (Medical): No    Lack of Transportation (Non-Medical): No  Physical Activity: Insufficiently Active (10/01/2020)   Exercise Vital Sign    Days of Exercise per Week: 7 days    Minutes of Exercise per Session: 10 min  Stress: No Stress Concern Present (10/01/2020)   [place]    Feeling of Stress : Not at all  Social Connections: Moderately Isolated (10/01/2020)   Social Connection and Isolation Panel [NHANES]    Frequency of Communication with Friends and Family: More than three times a week    Frequency of Social Gatherings with Friends and Family: Twice a week    Attends Religious Services: Never    Marine scientist or Organizations: No    Attends Archivist Meetings: Never    Marital  Status: Married  Human resources officer Violence: Not At Risk (10/01/2020)   Humiliation, Afraid, Rape, and Kick questionnaire    Fear of Current or Ex-Partner: No    Emotionally Abused: No    Physically Abused: No    Sexually Abused: No    FAMILY HISTORY: Family History  Problem Relation Age of Onset   Heart attack Mother    Hypertension Mother  Heart disease Mother    Sudden death Mother    Obesity Mother    Hypertension Sister    Diabetes Brother    Birth defects Son        heart defect    ALLERGIES:  is allergic to ace inhibitors.  MEDICATIONS:  Current Outpatient Medications  Medication Sig Dispense Refill   acetaminophen (TYLENOL) 325 MG tablet Take 650 mg by mouth 2 (two) times daily as needed (back ache).     amiodarone (PACERONE) 200 MG tablet Take 1 tablet by mouth twice a day until 9/26 then reduce to 1 tablet daily 84 tablet 0   apixaban (ELIQUIS) 5 MG TABS tablet TAKE 1 TABLET BY MOUTH TWICE  DAILY 180 tablet 3   atorvastatin (LIPITOR) 20 MG tablet TAKE 1 TABLET BY MOUTH ONCE  DAILY 100 tablet 1   BELSOMRA 20 MG TABS TAKE ONE TABLET BY MOUTH AT BEDTIME AS NEEDED 30 tablet 0   BELSOMRA 20 MG TABS Take 20 mg by mouth at bedtime. 30 tablet 4   carvedilol (COREG) 3.125 MG tablet Take 1 tablet (3.125 mg total) by mouth 2 (two) times daily with a meal. 180 tablet 3   cetirizine (ZYRTEC) 10 MG tablet Take 10 mg by mouth as needed for allergies.     fenofibrate 160 MG tablet TAKE 1 TABLET BY MOUTH DAILY 100 tablet 1   fexofenadine (ALLEGRA) 180 MG tablet Take 1 tablet (180 mg total) by mouth daily. For allergy symptoms (Patient taking differently: Take 180 mg by mouth as needed. For allergy symptoms) 90 tablet 3   fluticasone (FLONASE) 50 MCG/ACT nasal spray Place 1 spray into both nostrils daily as needed for allergies or rhinitis.     furosemide (LASIX) 20 MG tablet Take 1 tablet (20 mg total) by mouth daily as needed. 30 tablet 6   nitroGLYCERIN (NITROSTAT) 0.4 MG SL tablet  Place 1 tablet (0.4 mg total) under the tongue every 5 (five) minutes x 3 doses as needed for chest pain. If pain persist after call 911 25 tablet 4   sertraline (ZOLOFT) 50 MG tablet TAKE 1 TABLET BY MOUTH DAILY 100 tablet 1   spironolactone (ALDACTONE) 25 MG tablet Take 1 tablet (25 mg total) by mouth daily. 90 tablet 3   tirzepatide (MOUNJARO) 2.5 MG/0.5ML Pen Inject 2.5 mg into the skin once a week. (Patient not taking: Reported on 12/02/2021) 2 mL 0   valsartan (DIOVAN) 320 MG tablet TAKE 1 TABLET BY MOUTH DAILY 90 tablet 1   No current facility-administered medications for this visit.    REVIEW OF SYSTEMS:   Constitutional: ( - ) fevers, ( - )  chills , ( - ) night sweats Eyes: ( - ) blurriness of vision, ( - ) double vision, ( - ) watery eyes Ears, nose, mouth, throat, and face: ( - ) mucositis, ( - ) sore throat Respiratory: ( - ) cough, ( - ) dyspnea, ( - ) wheezes Cardiovascular: ( - ) palpitation, ( - ) chest discomfort, ( - ) lower extremity swelling Gastrointestinal:  ( - ) nausea, ( - ) heartburn, ( - ) change in bowel habits Skin: ( - ) abnormal skin rashes Lymphatics: ( - ) new lymphadenopathy, ( - ) easy bruising Neurological: ( - ) numbness, ( - ) tingling, ( - ) new weaknesses Behavioral/Psych: ( - ) mood change, ( - ) new changes  All other systems were reviewed with the patient and are negative.  PHYSICAL EXAMINATION:  Vitals:   12/02/21 1343  BP: (!) 140/70  Pulse: 93  Temp: 97.9 F (36.6 C)  SpO2: 94%   Filed Weights   12/02/21 1343  Weight: (!) 310 lb 9.6 oz (140.9 kg)    GENERAL: Chronically ill-appearing elderly Caucasian female, in NAD  SKIN: skin color, texture, turgor are normal, no rashes or significant lesions EYES: conjunctiva are pink and non-injected, sclera clear LUNGS: clear to auscultation and percussion with normal breathing effort HEART: regular rate & rhythm and no murmurs and no lower extremity edema Musculoskeletal: no cyanosis of  digits and no clubbing  PSYCH: alert & oriented x 3, fluent speech NEURO: no focal motor/sensory deficits  LABORATORY DATA:  I have reviewed the data as listed    Latest Ref Rng & Units 12/02/2021    2:45 PM 12/02/2021    9:38 AM 11/04/2021   10:29 AM  CBC  WBC 4.0 - 10.5 K/uL 15.6  12.5  14.6   Hemoglobin 12.0 - 15.0 g/dL 14.7  15.1  14.8   Hematocrit 36.0 - 46.0 % 46.0  48.8  45.6   Platelets 150 - 400 K/uL 275  267  290        Latest Ref Rng & Units 12/02/2021    2:45 PM 12/02/2021    9:38 AM 11/04/2021   10:29 AM  CMP  Glucose 70 - 99 mg/dL 107  96  87   BUN 8 - 23 mg/dL _0 Creatinine 0.44 - 1.00 mg/dL 1.36  1.15  1.05   Sodium 135 - 145 mmol/L 143  139  141   Potassium 3.5 - 5.1 mmol/L 4.2  4.9  4.6   Chloride 98 - 111 mmol/L 109  106  102   CO2 22 - 32 mmol/L _1 Calcium 8.9 - 10.3 mg/dL 8.8  9.0  9.6   Total Protein 6.5 - 8.1 g/dL 7.3   6.6   Total Bilirubin 0.3 - 1.2 mg/dL 0.5   0.5   Alkaline Phos 38 - 126 U/L 44   46   AST 15 - 41 U/L 19   13   ALT 0 - 44 U/L 15   6      ASSESSMENT & PLAN Julia West 66 y.o. female with medical history significant for CAD, left BKA, liver, hypertension, hyperlipidemia, internal/external hemorrhoids, and obesity who presents for evaluation of leukocytosis.  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  Lymphocytosis is an elevation in the lymphocyte cells in the blood. This may indicate several possible conditions including hematological malignancies, transient elevation from infection, or response to inflammation. Lymphocytes may also be elevated after splenectomy. The workup for lymphocytosis consists of determining if the lymphocytes are malignant in nature. The best test to determine this is flow cytometry, which can help  determine if the lymphocytes are a monoclonal population.  Additional workup includes inflammatory workup and detailed history to assure no infectious symptoms.    #Lymphocytosis  #Neutrophilia --labs to include CBC, CMP, LDH, and flow cytometry  --additional studies to include ESR and CRP   --Suspect that the patient's obstructive sleep apnea may be contributing to the elevated white blood cell count by increasing neutrophils. --no evidence of lymphadenopathy on physical exam. Patient has no history of splenectomy.   --RTC  in 6 months or sooner if indicated by the above labs.    Orders Placed This Encounter  Procedures   CBC with Differential ([place] Only)    Standing Status:   Future    Number of Occurrences:   1    Standing Expiration Date:   12/03/2022   CMP ([place] only)    Standing Status:   Future    Number of Occurrences:   1    Standing Expiration Date:   12/03/2022   Flow Cytometry, Peripheral Blood (Oncology)    Standing Status:   Future    Number of Occurrences:   1    Standing Expiration Date:   12/03/2022   Lactate dehydrogenase (LDH)    Standing Status:   Future    Number of Occurrences:   1    Standing Expiration Date:   12/02/2022   Sedimentation rate    Standing Status:   Future    Number of Occurrences:   1    Standing Expiration Date:   12/02/2022   C-reactive protein    Standing Status:   Future    Number of Occurrences:   1    Standing Expiration Date:   12/02/2022   Erythropoietin    Standing Status:   Future    Number of Occurrences:   1    Standing Expiration Date:   12/02/2022    All questions were answered. The patient knows to call the clinic with any problems, questions or concerns.  A total of more than 60 minutes were spent on this encounter with face-to-face time and non-face-to-face time, including preparing to see the patient, ordering tests and/or medications, counseling the patient and coordination of care as outlined above.    Ledell Peoples, MD Department of Hematology/Oncology [place] [facility] Phone: [phone] Pager: [phone] Email: [email or web address]  12/09/2021 2:52 PM

## 2021-12-10 DIAGNOSIS — G4733 Obstructive sleep apnea (adult) (pediatric): Secondary | ICD-10-CM | POA: Diagnosis not present

## 2021-12-14 ENCOUNTER — Other Ambulatory Visit (HOSPITAL_COMMUNITY): Payer: Self-pay | Admitting: *Deleted

## 2021-12-14 DIAGNOSIS — I4819 Other persistent atrial fibrillation: Secondary | ICD-10-CM

## 2021-12-15 ENCOUNTER — Other Ambulatory Visit: Payer: Self-pay | Admitting: Family Medicine

## 2021-12-16 ENCOUNTER — Ambulatory Visit (HOSPITAL_COMMUNITY): Payer: Medicare Other | Admitting: Nurse Practitioner

## 2021-12-18 ENCOUNTER — Encounter (HOSPITAL_COMMUNITY): Payer: Medicare Other | Admitting: Cardiology

## 2021-12-30 ENCOUNTER — Encounter (HOSPITAL_COMMUNITY)
Admission: RE | Disposition: A | Discharge status: Home / Self Care | Payer: Self-pay | Source: Home / Self Care | Attending: Cardiovascular Disease

## 2021-12-30 ENCOUNTER — Ambulatory Visit (HOSPITAL_COMMUNITY): Payer: Medicare Other | Admitting: Anesthesiology

## 2021-12-30 ENCOUNTER — Ambulatory Visit (HOSPITAL_COMMUNITY)
Admission: RE | Admit: 2021-12-30 | Discharge: 2021-12-30 | Disposition: A | Discharge status: Home / Self Care | Payer: Medicare Other | Attending: Cardiovascular Disease | Admitting: Cardiovascular Disease

## 2021-12-30 ENCOUNTER — Ambulatory Visit (HOSPITAL_BASED_OUTPATIENT_CLINIC_OR_DEPARTMENT_OTHER): Payer: Medicare Other | Admitting: Anesthesiology

## 2021-12-30 ENCOUNTER — Other Ambulatory Visit: Payer: Self-pay

## 2021-12-30 ENCOUNTER — Ambulatory Visit (HOSPITAL_COMMUNITY)
Admission: RE | Admit: 2021-12-30 | Discharge: 2021-12-30 | Disposition: A | Discharge status: Home / Self Care | Payer: Medicare Other | Source: Ambulatory Visit | Attending: Physician Assistant | Admitting: Physician Assistant

## 2021-12-30 ENCOUNTER — Encounter (HOSPITAL_COMMUNITY): Payer: Self-pay | Admitting: Cardiovascular Disease

## 2021-12-30 DIAGNOSIS — I2510 Atherosclerotic heart disease of native coronary artery without angina pectoris: Secondary | ICD-10-CM

## 2021-12-30 DIAGNOSIS — Z87891 Personal history of nicotine dependence: Secondary | ICD-10-CM | POA: Insufficient documentation

## 2021-12-30 DIAGNOSIS — I4819 Other persistent atrial fibrillation: Secondary | ICD-10-CM

## 2021-12-30 DIAGNOSIS — I509 Heart failure, unspecified: Secondary | ICD-10-CM

## 2021-12-30 DIAGNOSIS — Z6841 Body Mass Index (BMI) 40.0 and over, adult: Secondary | ICD-10-CM | POA: Insufficient documentation

## 2021-12-30 DIAGNOSIS — Z79899 Other long term (current) drug therapy: Secondary | ICD-10-CM | POA: Insufficient documentation

## 2021-12-30 DIAGNOSIS — I4891 Unspecified atrial fibrillation: Secondary | ICD-10-CM

## 2021-12-30 DIAGNOSIS — I252 Old myocardial infarction: Secondary | ICD-10-CM | POA: Insufficient documentation

## 2021-12-30 DIAGNOSIS — I480 Paroxysmal atrial fibrillation: Secondary | ICD-10-CM

## 2021-12-30 DIAGNOSIS — F32A Depression, unspecified: Secondary | ICD-10-CM | POA: Diagnosis not present

## 2021-12-30 DIAGNOSIS — I739 Peripheral vascular disease, unspecified: Secondary | ICD-10-CM | POA: Diagnosis not present

## 2021-12-30 DIAGNOSIS — Z951 Presence of aortocoronary bypass graft: Secondary | ICD-10-CM | POA: Insufficient documentation

## 2021-12-30 DIAGNOSIS — F419 Anxiety disorder, unspecified: Secondary | ICD-10-CM | POA: Diagnosis not present

## 2021-12-30 DIAGNOSIS — I110 Hypertensive heart disease with heart failure: Secondary | ICD-10-CM | POA: Diagnosis not present

## 2021-12-30 HISTORY — PX: CARDIOVERSION: SHX1299

## 2021-12-30 LAB — CBC
HCT: 48.2 % — ABNORMAL HIGH (ref 36.0–46.0)
Hemoglobin: 15.2 g/dL — ABNORMAL HIGH (ref 12.0–15.0)
MCH: 27.9 pg (ref 26.0–34.0)
MCHC: 31.5 g/dL (ref 30.0–36.0)
MCV: 88.4 fL (ref 80.0–100.0)
Platelets: 282 10*3/uL (ref 150–400)
RBC: 5.45 MIL/uL — ABNORMAL HIGH (ref 3.87–5.11)
RDW: 17.6 % — ABNORMAL HIGH (ref 11.5–15.5)
WBC: 10.7 10*3/uL — ABNORMAL HIGH (ref 4.0–10.5)
nRBC: 0 % (ref 0.0–0.2)

## 2021-12-30 LAB — BASIC METABOLIC PANEL
Anion gap: 9 (ref 5–15)
BUN: 18 mg/dL (ref 8–23)
CO2: 27 mmol/L (ref 22–32)
Calcium: 9.5 mg/dL (ref 8.9–10.3)
Chloride: 107 mmol/L (ref 98–111)
Creatinine, Ser: 1.17 mg/dL — ABNORMAL HIGH (ref 0.44–1.00)
GFR, Estimated: 51 mL/min — ABNORMAL LOW (ref >60)
Glucose, Bld: 99 mg/dL (ref 70–99)
Potassium: 5 mmol/L (ref 3.5–5.1)
Sodium: 143 mmol/L (ref 135–145)

## 2021-12-30 SURGERY — CARDIOVERSION
Anesthesia: General

## 2021-12-30 MED ORDER — LIDOCAINE 2% (20 MG/ML) 5 ML SYRINGE
INTRAMUSCULAR | Status: DC | PRN
  Administered 2021-12-30: 100 mg via INTRAVENOUS

## 2021-12-30 MED ORDER — SODIUM CHLORIDE 0.9 % IV SOLN: INTRAVENOUS | Status: DC

## 2021-12-30 MED ORDER — PROPOFOL 10 MG/ML IV BOLUS
INTRAVENOUS | Status: DC | PRN
  Administered 2021-12-30: 70 mg via INTRAVENOUS
  Administered 2021-12-30: 20 mg via INTRAVENOUS

## 2021-12-30 NOTE — Anesthesia Preprocedure Evaluation (Addendum)
Anesthesia Evaluation  Patient identified by MRN, date of birth, ID band Patient awake    Reviewed: Allergy & Precautions, NPO status , Patient's Chart, lab work & pertinent test results  Airway Mallampati: II  TM Distance: <3 FB Neck ROM: Full    Dental no notable dental hx. (+) Edentulous Upper, Edentulous Lower,    Pulmonary former smoker   Pulmonary exam normal breath sounds clear to auscultation       Cardiovascular hypertension, Pt. on medications + CAD, + Past MI, + CABG, + Peripheral Vascular Disease and +CHF  Normal cardiovascular exam Rhythm:Regular Rate:Normal  ECHO 7/23 echnically difficult study, consider limited echo with contrast to better evaluation wall motion. Left ventricular ejection fraction, by estimation, is 30 to 35%. The left ventricle has moderately decreased function. Left ventricular endocardial border not optimally defined to evaluate regional wall motion. There is mild left ventricular hypertrophy. Left ventricular diastolic parameters are indeterminate. 1. Right ventricular systolic function is normal. The right ventricular size is mildly enlarged. Tricuspid regurgitation signal is inadequate for assessing PA pressure. 2. 3. Right atrial size was mildly dilated. 4. The mitral valve is normal in structure. No evidence of mitral valve regurgitation. The aortic valve was not well visualized. Aortic valve regurgitation is trivial. No aortic stenosis is present. 5. The inferior vena cava is dilated in size with >50% respiratory variability, suggesting right atrial pressure of 8 mmHg.   Neuro/Psych  PSYCHIATRIC DISORDERS Anxiety Depression    negative neurological ROS     GI/Hepatic negative GI ROS, Neg liver ROS,,,  Endo/Other    Morbid obesity  Renal/GU negative Renal ROS  negative genitourinary   Musculoskeletal  (+) Arthritis , Rheumatoid disorders,    Abdominal  (+) + obese   Peds negative pediatric ROS (+)  Hematology negative hematology ROS (+)   Anesthesia Other Findings   Reproductive/Obstetrics negative OB ROS                             Anesthesia Physical Anesthesia Plan  ASA: 3  Anesthesia Plan: General   Post-op Pain Management: Minimal or no pain anticipated   Induction: Intravenous  PONV Risk Score and Plan: 3 and Treatment may vary due to age or medical condition  Airway Management Planned: Mask, Natural Airway and Simple Face Mask  Additional Equipment: None  Intra-op Plan:   Post-operative Plan: Extubation in OR  Informed Consent: I have reviewed the patients History and Physical, chart, labs and discussed the procedure including the risks, benefits and alternatives for the proposed anesthesia with the patient or authorized representative who has indicated his/her understanding and acceptance.     Dental advisory given  Plan Discussed with: CRNA and Anesthesiologist  Anesthesia Plan Comments:         Anesthesia Quick Evaluation

## 2021-12-30 NOTE — Interval H&P Note (Signed)
History and Physical Interval Note:  12/30/2021 10:05 AM  Julia West  has presented today for surgery, with the diagnosis of AFIB.  The various methods of treatment have been discussed with the patient and family. After consideration of risks, benefits and other options for treatment, the patient has consented to  Procedure(s): CARDIOVERSION (N/A) as a surgical intervention.  The patient's history has been reviewed, patient examined, no change in status, stable for surgery.  I have reviewed the patient's chart and labs.  Questions were answered to the patient's satisfaction.    NPO for DCCV. On eliquis and no missed doses >3 weeks.   [place] Bells T. Audie Box, MD, [place]  [address] [place], [place] [address] [phone]  10:05 AM

## 2021-12-30 NOTE — Transfer of Care (Signed)
Immediate Anesthesia Transfer of Care Note  Patient: Julia West  Procedure(s) Performed: CARDIOVERSION  Patient Location: Short Stay  Anesthesia Type:General  Level of Consciousness: drowsy  Airway & Oxygen Therapy: Patient Spontanous Breathing  Post-op Assessment: Report given to RN and Post -op Vital signs reviewed and stable  Post vital signs: Reviewed and stable  Last Vitals:  Vitals Value Taken Time  BP 114/98   Temp    Pulse 76   Resp 14   SpO2 93     Last Pain:  Vitals:   12/30/21 1000  TempSrc: Temporal  PainSc: 0-No pain         Complications: No notable events documented.

## 2021-12-30 NOTE — CV Procedure (Signed)
   DIRECT CURRENT CARDIOVERSION  NAME:  Julia West    MRN: [id number] DOB:  Aug 02, 1955    ADMIT DATE: 12/30/2021  Indication:  Symptomatic atrial fibrillation  Procedure Note:  The patient signed informed consent.  They have had had therapeutic anticoagulation with eliquis greater than 3 weeks.  Anesthesia was administered by Dr. Ambrose Pancoast.  Adequate airway was maintained throughout and vital followed per protocol.  They were cardioverted x 3 with 200J of biphasic synchronized energy.  The patient remained in atrial fibrillation and did not have any evidence of NSR. There were no apparent complications.  The patient had normal neuro status and respiratory status post procedure with vitals stable as recorded elsewhere.    Follow up: They will continue on current medical therapy and follow up with cardiology as scheduled.  [place] Bells T. Audie Box, MD, [place]  [address], [place], [place] [address] [phone]  10:54 AM

## 2021-12-30 NOTE — Anesthesia Procedure Notes (Signed)
Procedure Name: General with mask airway Date/Time: 12/30/2021 10:42 AM  Performed by: Erick Colace, CRNAPre-anesthesia Checklist: Patient identified, Emergency Drugs available, Suction available and Patient being monitored Patient Re-evaluated:Patient Re-evaluated prior to induction Oxygen Delivery Method: Ambu bag Preoxygenation: Pre-oxygenation with 100% oxygen Induction Type: IV induction Dental Injury: Teeth and Oropharynx as per pre-operative assessment

## 2021-12-31 DIAGNOSIS — G4733 Obstructive sleep apnea (adult) (pediatric): Secondary | ICD-10-CM | POA: Diagnosis not present

## 2021-12-31 DIAGNOSIS — M869 Osteomyelitis, unspecified: Secondary | ICD-10-CM | POA: Diagnosis not present

## 2021-12-31 DIAGNOSIS — T8189XA Other complications of procedures, not elsewhere classified, initial encounter: Secondary | ICD-10-CM | POA: Diagnosis not present

## 2021-12-31 DIAGNOSIS — S81009A Unspecified open wound, unspecified knee, initial encounter: Secondary | ICD-10-CM | POA: Diagnosis not present

## 2021-12-31 NOTE — Anesthesia Postprocedure Evaluation (Signed)
Anesthesia Post Note  Patient: Julia West  Procedure(s) Performed: CARDIOVERSION     Patient location during evaluation: PACU Anesthesia Type: General Level of consciousness: awake and alert Pain management: pain level controlled Vital Signs Assessment: post-procedure vital signs reviewed and stable Respiratory status: spontaneous breathing, nonlabored ventilation, respiratory function stable and patient connected to nasal cannula oxygen Cardiovascular status: blood pressure returned to baseline and stable Postop Assessment: no apparent nausea or vomiting Anesthetic complications: no   No notable events documented.  Last Vitals:  Vitals:   12/30/21 1120 12/30/21 1123  BP: 123/76   Pulse: 70 78  Resp: 20 (!) 23  Temp:    SpO2: 92% 92%    Last Pain:  Vitals:   12/30/21 1123  TempSrc:   PainSc: 0-No pain                 Hawthorne Day

## 2022-01-01 ENCOUNTER — Telehealth: Payer: Medicare Other

## 2022-01-03 ENCOUNTER — Encounter (HOSPITAL_COMMUNITY): Payer: Self-pay | Admitting: Cardiovascular Disease

## 2022-01-03 DIAGNOSIS — I4819 Other persistent atrial fibrillation: Secondary | ICD-10-CM | POA: Insufficient documentation

## 2022-01-03 NOTE — Progress Notes (Unsigned)
Cardiology Office Note   Date:  01/06/2022   ID:  Julia West, DOB [birth year], MRN [id number]  PCP:  [name], MD  Cardiologist:   [name], MD  Chief Complaint  Patient presents with   Atrial Fibrillation     History of Present Illness: Julia West is a 66 y.o. female who presented for evaluation of CAD. She was found to be in atrial fib at a previous visit.   She wore a monitor and was noted to be persistent atrial fib with some tachybradycardia rates .  She was in the hospital with HF and rapid atrial fib and she had an EF of 30 - 35%.  She failed Tikosyn and she was treated with amio.   She had DCCV.   However this failed to maintain sinus rhythm.  She says she really does not feel her heart skipping.  Does not feel particularly short of breath but she is not having any PND or orthopnea.  She does report that she is fatigued.  She wakes up and she feels okay but soon afterwards she gets tired.  She gets around pretty well with her prosthesis.  He had no new chest pain, neck or arm discomfort.  She had no weight gain or edema.     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    S/p CABG x 2 in 2003   Cancer [facility])    basal cell face   Complication of anesthesia    states low O2 sats post-op 11/13, always slow to awaken   Degenerative joint disease    Dehiscence of amputation stump ([place])    left BKA   Depression    Fatty liver    HTN (hypertension)    Hyperlipidemia LDL goal <70    Incarcerated hernia    Internal and external hemorrhoids without complication    Lymphedema of arm    right   Morbid obesity (HCC)    Rheumatoid arthritis ([place])    Surgical wound, non healing ABDOMINAL    has wound vac @ 125 mm Hg   Ventral hernia     Past Surgical History:  Procedure Laterality Date   AMPUTATION Left 05/06/2017   Procedure: BELOW KNEE AMPUTATION;  Surgeon: Newt Minion, MD;  Location: [place];  Service:  Orthopedics;  Laterality: Left;   APPLICATION OF WOUND VAC     BOWEL RESECTION  02/07/2012   Procedure: SMALL BOWEL RESECTION;  Surgeon: Gayland Curry, MD,FACS;  Location: [place];  Service: General;;   CARDIOVASCULAR STRESS TEST  01-17-2012  DR Scotti Kosta   LOW RISK NUCLEAR TEST   CARDIOVERSION N/A 07/14/2020   Procedure: CARDIOVERSION;  Surgeon: Arnoldo Lenis, MD;  Location: [facility];  Service: Endoscopy;  Laterality: N/A;   CARDIOVERSION N/A 09/22/2021   Procedure: CARDIOVERSION;  Surgeon: Jerline Pain, MD;  Location: [facility] ENDOSCOPY;  Service: Cardiovascular;  Laterality: N/A;   CARDIOVERSION N/A 12/30/2021   Procedure: CARDIOVERSION;  Surgeon: Geralynn Rile, MD;  Location: [place];  Service: Cardiovascular;  Laterality: N/A;   CESAREAN SECTION     x 4 in remote past   [place] GRAFT  2003   by Dr. Servando Snare. LIMA to the LAD, free RIMA to the circumflex. Stress perfusion study December 2009 with no high-risk areas of ischemia. She has a well-preserved ejection fraction   HYSTEROSCOPY  WITH D & C N/A 05/14/2014   Procedure: DILATATION AND CURETTAGE (no specimen); HYSTEROSCOPY;  Surgeon: Jonnie Kind, MD;  Location: [facility];  Service: Gynecology;  Laterality: N/A;   I & D EXTREMITY Left 06/21/2017   Procedure: IRRIGATION AND DEBRIDEMENT OF LEFT LEG AMPUTATION SITE;  Surgeon: Wallace Going, DO;  Location: [facility];  Service: Plastics;  Laterality: Left;   INCISION AND DRAINAGE OF WOUND  04/17/2012   Procedure: IRRIGATION AND DEBRIDEMENT WOUND;  Surgeon: Theodoro Kos, DO;  Location: [place];  Service: Plastics;  Laterality: N/A;  OF ABDOMINAL WOUND, SURGICAL PREP AND PLACEMENT OF VAC, REMOVAL FOREHEAD SKIN LESION   INCISION AND DRAINAGE OF WOUND N/A 04/24/2012   Procedure: IRRIGATION AND DEBRIDEMENT WOUND;  Surgeon: Theodoro Kos, DO;  Location: [place];  Service: Plastics;  Laterality: N/A;  I & D ABDOMINAL WOUND  WITH VAC AND ACELL   INCISION AND DRAINAGE OF WOUND N/A 05/01/2012   Procedure: IRRIGATION AND DEBRIDEMENT WOUND;  Surgeon: Theodoro Kos, DO;  Location: [place];  Service: Plastics;  Laterality: N/A;  WITH SURGICAL PREP AND PLACEMENT OF VAC   INCISION AND DRAINAGE OF WOUND N/A 05/08/2012   Procedure: IRRIGATION AND DEBRIDEMENT OF ABD WOUND SURGICAL PREP AND PLACEMENT OF VAC ;  Surgeon: Theodoro Kos, DO;  Location: [place];  Service: Plastics;  Laterality: N/A;  IRRIGATION AND DEBRIDEMENT OF ABD WOUND SURGICAL PREP AND PLACEMENT OF VAC    INCISION AND DRAINAGE OF WOUND N/A 05/15/2012   Procedure: IRRIGATION AND DEBRIDEMENT OF ABDOMINAL WOUND WITH POSSIBLE SURGICAL PREP AND PLACEMENT OF VAC;  Surgeon: Theodoro Kos, DO;  Location: [place];  Service: Plastics;  Laterality: N/A;   INCISION AND DRAINAGE OF WOUND N/A 05/22/2012   Procedure: IRRIGATION AND DEBRIDEMENT OF ABODOMINAL WOUND WITH  SURGICAL PREP AND VAC PLACEMENT;  Surgeon: Theodoro Kos, DO;  Location: [place];  Service: Plastics;  Laterality: N/A;   INCISION AND DRAINAGE OF WOUND N/A 06/28/2012   Procedure: IRRIGATION AND DEBRIDEMENT OF ABDOMINAL ULCER SURGICAL PREP AND PLACEMENT OF ACELL AND VAC;  Surgeon: Theodoro Kos, DO;  Location: [place];  Service: Plastics;  Laterality: N/A;   INCISIONAL HERNIA REPAIR  02/07/2012   Procedure: HERNIA REPAIR INCISIONAL;  Surgeon: Gayland Curry, MD,FACS;  Location: [place];  Service: General;;  Open, Primary repair, strangulated Incisional hernia.   LESION REMOVAL  04/17/2012   Procedure: LESION REMOVAL;  Surgeon: Theodoro Kos, DO;  Location: [place];  Service: Plastics;  Laterality: N/A;   CENTER OF FOREHEAD, REMOVAL FORHEAD SKIN LESION   ORIF TOE FRACTURE Left 02/09/2017   Procedure: EXCISION TALAR HEAD, INTERNAL FIXATION MEDIAL COLUMN LEFT FOOT;  Surgeon: Newt Minion, MD;  Location: [place];   Service: Orthopedics;  Laterality: Left;   POLYPECTOMY N/A 05/14/2014   Procedure: ENDOMETRIAL POLYPECTOMY;  Surgeon: Jonnie Kind, MD;  Location: [facility];  Service: Gynecology;  Laterality: N/A;   STUMP REVISION Left 08/31/2017   Procedure: REVISION LEFT BELOW KNEE AMPUTATION;  Surgeon: Newt Minion, MD;  Location: [place];  Service: Orthopedics;  Laterality: Left;     Current Outpatient Medications  Medication Sig Dispense Refill   acetaminophen (TYLENOL) 325 MG tablet Take 650 mg by mouth 2 (two) times daily as needed (back ache).     apixaban (ELIQUIS) 5 MG TABS tablet TAKE 1 TABLET BY MOUTH TWICE  DAILY 180 tablet 3   atorvastatin (  LIPITOR) 20 MG tablet TAKE 1 TABLET BY MOUTH ONCE  DAILY 100 tablet 1   BELSOMRA 20 MG TABS Take 20 mg by mouth at bedtime. 30 tablet 4   carvedilol (COREG) 3.125 MG tablet Take 1 tablet (3.125 mg total) by mouth 2 (two) times daily with a meal. 180 tablet 3   cetirizine (ZYRTEC) 10 MG tablet Take 10 mg by mouth daily as needed for allergies.     fenofibrate 160 MG tablet TAKE 1 TABLET BY MOUTH DAILY 100 tablet 1   fexofenadine (ALLEGRA) 180 MG tablet Take 1 tablet (180 mg total) by mouth daily. For allergy symptoms (Patient taking differently: Take 180 mg by mouth daily as needed for allergies. For allergy symptoms) 90 tablet 3   fluticasone (FLONASE) 50 MCG/ACT nasal spray Place 1 spray into both nostrils daily as needed for allergies or rhinitis.     furosemide (LASIX) 20 MG tablet Take 1 tablet (20 mg total) by mouth daily as needed. 30 tablet 6   nitroGLYCERIN (NITROSTAT) 0.4 MG SL tablet Place 1 tablet (0.4 mg total) under the tongue every 5 (five) minutes x 3 doses as needed for chest pain. If pain persist after call 911 25 tablet 4   sacubitril-valsartan (ENTRESTO) 49-51 MG Take 1 tablet by mouth 2 (two) times daily. 60 tablet 6   sertraline (ZOLOFT) 50 MG tablet TAKE 1 TABLET BY MOUTH DAILY 100 tablet 1   spironolactone (ALDACTONE) 25 MG tablet Take 1  tablet (25 mg total) by mouth daily. 90 tablet 3   No current facility-administered medications for this visit.    Allergies:   Ace inhibitors    ROS:  Please see the history of present illness.   Otherwise, review of systems are positive for none.   All other systems are reviewed and negative.    PHYSICAL EXAM: VS:  BP 138/70   Pulse 72   Ht '5\' 7"'$  (1.702 m)   Wt 295 lb (133.8 kg)   BMI 46.20 kg/m  , BMI Body mass index is 46.2 kg/m. GENERAL:  Well appearing NECK:  No jugular venous distention, waveform within normal limits, carotid upstroke brisk and symmetric, no bruits, no thyromegaly LUNGS:  Clear to auscultation bilaterally CHEST:  Unremarkable HEART:  PMI not displaced or sustained,S1 and S2 within normal limits, no S3,  no clicks, no rubs, no murmurs, irregular  ABD:  Flat, positive bowel sounds normal in frequency in pitch, no bruits, no rebound, no guarding, no midline pulsatile mass, no hepatomegaly, no splenomegaly EXT:  2 plus pulses throughout, no edema, no cyanosis no clubbing   EKG:  EKG is not ordered today.    Recent Labs: 09/21/2021: TSH 0.951 09/24/2021: Magnesium 2.2 10/05/2021: B Natriuretic Peptide 243.7 12/02/2021: ALT 15 12/30/2021: BUN 18; Creatinine, Ser 1.17; Hemoglobin 15.2; Platelets 282; Potassium 5.0; Sodium 143    Lipid Panel    Component Value Date/Time   CHOL 117 11/04/2021 1029   TRIG 100 11/04/2021 1029   TRIG 247 (H) 09/12/2013 1137   HDL 34 (L) 11/04/2021 1029   HDL 35 (L) 09/12/2013 1137   CHOLHDL 3.4 11/04/2021 1029   LDLCALC 64 11/04/2021 1029   LDLCALC 115 (H) 09/12/2013 1137      Wt Readings from Last 3 Encounters:  01/06/22 295 lb (133.8 kg)  12/30/21 (!) 307 lb (139.3 kg)  12/02/21 (!) 309 lb 12.8 oz (140.5 kg)      Other studies Reviewed: Additional studies/ records that were reviewed today include: DCCV  and hospital records Review of the above records demonstrates: See elsewhere   ASSESSMENT AND PLAN:  CAD:     The patient has no new sypmtoms.  No further cardiovascular testing is indicated.  We will continue with aggressive risk reduction and meds as listed.  ATRIAL FIB:   Ms. TALAYSHA FREEBERG has a CHA2DS2 - VASc score of 4.  She has failed Tikosyn.  She failed amiodarone.  She has been turned down for ablation because of her size.  I am going to pursue rate control and anticoagulation.  I will stop the amiodarone and she will watch her heart rate on her Watch.   HTN:  The blood pressure is being controlled in the context of managing her cardiomyopathy.  DYSLIPIDEMIA:   LDL was 61 with an HDL of 73.  No change in therapy.   FATIGUE:   She has been diagnosed with sleep apnea and now has CPAP.  She thinks this is working.  SLEEP APNEA: As above   CHRONIC SYSTOLIC HF: This may be rate related.  I am going to continue to titrate meds now.  I am going to switch her to [facility] although cost might be an issue.  She does have follow-up in the heart failure clinic in the continue med titration.  Of note she failed [place] before.  She will stop her valsartan.  Current medicines are reviewed at length with the patient today.  The patient does not have concerns regarding medicines.  The following changes have been made:   Labs/ tests ordered today include:    No orders of the defined types were placed in this encounter.   Disposition:   FU with me in 1 that she see heart failure.    Signed, [name], MD  01/06/2022 9:56 AM    [place]

## 2022-01-06 ENCOUNTER — Encounter: Payer: Self-pay | Admitting: Cardiology

## 2022-01-06 ENCOUNTER — Ambulatory Visit: Payer: Medicare Other | Admitting: Cardiology

## 2022-01-06 VITALS — BP 138/70 | HR 72 | Ht 67.0 in | Wt 295.0 lb

## 2022-01-06 DIAGNOSIS — I2510 Atherosclerotic heart disease of native coronary artery without angina pectoris: Secondary | ICD-10-CM | POA: Diagnosis not present

## 2022-01-06 DIAGNOSIS — I4819 Other persistent atrial fibrillation: Secondary | ICD-10-CM | POA: Diagnosis not present

## 2022-01-06 DIAGNOSIS — I5021 Acute systolic (congestive) heart failure: Secondary | ICD-10-CM

## 2022-01-06 DIAGNOSIS — E785 Hyperlipidemia, unspecified: Secondary | ICD-10-CM

## 2022-01-06 MED ORDER — SACUBITRIL-VALSARTAN 49-51 MG PO TABS: 1.0000 | ORAL_TABLET | Freq: Two times a day (BID) | ORAL | 6 refills | Status: DC

## 2022-01-06 NOTE — Patient Instructions (Addendum)
Medication Instructions:  Please discontinue your Amiodarone and Valsartan. Start Entresto 49-51 mg one tablet twice a day. Continue all other medications as listed.  *If you need a refill on your cardiac medications before your next appointment, please call your pharmacy*   Follow-Up: At [facility], you and your health needs are our priority.  As part of our continuing mission to provide you with exceptional heart care, we have created designated Provider Care Teams.  These Care Teams include your primary Cardiologist (physician) and Advanced Practice Providers (APPs -  Physician Assistants and Nurse Practitioners) who all work together to provide you with the care you need, when you need it.  We recommend signing up for the patient portal called "MyChart".  Sign up information is provided on this After Visit Summary.  MyChart is used to connect with patients for Virtual Visits (Telemedicine).  Patients are able to view lab/test results, encounter notes, upcoming appointments, etc.  Non-urgent messages can be sent to your provider as well.   To learn more about what you can do with MyChart, go to [email or web address].    Your next appointment:   3 month(s)  The format for your next appointment:   In Person  Provider:   [name], MD     Important Information About [place]

## 2022-01-09 DIAGNOSIS — G4733 Obstructive sleep apnea (adult) (pediatric): Secondary | ICD-10-CM | POA: Diagnosis not present

## 2022-01-15 ENCOUNTER — Other Ambulatory Visit (HOSPITAL_COMMUNITY): Payer: Self-pay

## 2022-01-15 DIAGNOSIS — I4819 Other persistent atrial fibrillation: Secondary | ICD-10-CM

## 2022-01-15 MED ORDER — APIXABAN 5 MG PO TABS: 5.0000 mg | ORAL_TABLET | Freq: Two times a day (BID) | ORAL | 0 refills | Status: DC

## 2022-01-20 ENCOUNTER — Telehealth: Payer: Self-pay | Admitting: *Deleted

## 2022-01-20 NOTE — Telephone Encounter (Signed)
Received completed paperwork from pt for pt assistance foundation for [facility].  Dr Percival Spanish has signed.  Application faxed to [employer].  Pt aware.   At pt's request, I mailed her proof of income back to her at her home address.

## 2022-01-21 DIAGNOSIS — G4733 Obstructive sleep apnea (adult) (pediatric): Secondary | ICD-10-CM | POA: Diagnosis not present

## 2022-01-27 ENCOUNTER — Ambulatory Visit (HOSPITAL_COMMUNITY)
Admission: RE | Admit: 2022-01-27 | Discharge: 2022-01-27 | Disposition: A | Discharge status: Home / Self Care | Payer: Medicare Other | Source: Ambulatory Visit | Attending: Adult Health | Admitting: Adult Health

## 2022-01-27 DIAGNOSIS — I252 Old myocardial infarction: Secondary | ICD-10-CM | POA: Insufficient documentation

## 2022-01-27 DIAGNOSIS — I5022 Chronic systolic (congestive) heart failure: Secondary | ICD-10-CM | POA: Insufficient documentation

## 2022-01-27 DIAGNOSIS — I080 Rheumatic disorders of both mitral and aortic valves: Secondary | ICD-10-CM | POA: Diagnosis not present

## 2022-01-27 DIAGNOSIS — I110 Hypertensive heart disease with heart failure: Secondary | ICD-10-CM | POA: Diagnosis not present

## 2022-01-27 DIAGNOSIS — E785 Hyperlipidemia, unspecified: Secondary | ICD-10-CM | POA: Diagnosis not present

## 2022-01-27 LAB — ECHOCARDIOGRAM COMPLETE
Calc EF: 39.8 %
S' Lateral: 4.4 cm
Single Plane A2C EF: 35.5 %
Single Plane A4C EF: 45.1 %

## 2022-01-27 NOTE — Progress Notes (Signed)
Cardiology Office Note Date:  01/27/2022  Patient ID:  Julia West, Julia West [birth year], MRN [id number] PCP:  [name], MD  Cardiologist:  Dr. Percival Spanish Electrophysiologist: Dr. Curt Bears    Chief Complaint:  3 mo  History of Present Illness: Julia West is a 66 y.o. female with history of CAD (CABG 2003), HTN, HLD obesity, PVD (s/p L BKA), NICM (suspect tachy/AFib mediated), PVCs, RA,   She saw Dr. Curt Bears 11/06/21, with increasing AFib burden Tikosyn stopped and loaded on amiodarone.  Saw the AFib clinic 12/02/21, amiodarone titration in progress, planned for DCCV  12/30/21: failed DCCV, shocked x3 with no sinus beats.  Saw Dr. Percival Spanish 01/06/22, no cardiac awareness, no overt symptoms/awareness of her Afib though generally fatigued.  Planned for rate control strategy, and amiodarone stopped. Planned to transition from ARB > Entresto and advance meds as able  TODAY She is accompanied by her daughter today, was not certain if she needed to come or not. 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    S/p CABG x 2 in 2003   Cancer [facility])    basal cell face   Complication of anesthesia    states low O2 sats post-op 11/13, always slow to awaken   Degenerative joint disease    Dehiscence of amputation stump ([place])    left BKA   Depression    Fatty liver    HTN (hypertension)    Hyperlipidemia LDL goal <70    Incarcerated hernia    Internal and external hemorrhoids without complication    Lymphedema of arm    right   Morbid obesity ([place])    Rheumatoid arthritis ([place])    Surgical wound, non healing ABDOMINAL    has wound vac @  125 mm Hg   Ventral hernia     Past Surgical History:  Procedure Laterality Date   AMPUTATION Left 05/06/2017   Procedure: BELOW KNEE AMPUTATION;  Surgeon: Newt Minion, MD;  Location: [place];  Service: Orthopedics;  Laterality: Left;   APPLICATION OF WOUND VAC     BOWEL RESECTION  02/07/2012   Procedure: SMALL BOWEL RESECTION;  Surgeon: Gayland Curry, MD,FACS;  Location: [place];  Service: General;;   CARDIOVASCULAR STRESS TEST  01-17-2012  DR HOCHREIN   LOW RISK NUCLEAR TEST   CARDIOVERSION N/A 07/14/2020   Procedure: CARDIOVERSION;  Surgeon: Arnoldo Lenis, MD;  [facility];  Service: Endoscopy;  Laterality: N/A;   CARDIOVERSION N/A 09/22/2021   Procedure: CARDIOVERSION;  Surgeon: Jerline Pain, MD;  Location: [facility] ENDOSCOPY;  Service: Cardiovascular;  Laterality: N/A;   CARDIOVERSION N/A 12/30/2021   Procedure: CARDIOVERSION;  Surgeon: Geralynn Rile, MD;  Location: [place];  Service: Cardiovascular;  Laterality: N/A;   CESAREAN SECTION     x 4 in remote past   [place] GRAFT  2003   by Dr. Servando Snare. LIMA to the LAD, free RIMA to the circumflex. Stress perfusion study December 2009 with no high-risk areas of ischemia. She has a well-preserved ejection fraction   HYSTEROSCOPY WITH D & C  N/A 05/14/2014   Procedure: DILATATION AND CURETTAGE (no specimen); HYSTEROSCOPY;  Surgeon: Jonnie Kind, MD;  Location: [facility];  Service: Gynecology;  Laterality: N/A;   I & D EXTREMITY Left 06/21/2017   Procedure: IRRIGATION AND DEBRIDEMENT OF LEFT LEG AMPUTATION SITE;  Surgeon: Wallace Going, DO;  Location: [facility];  Service: Plastics;  Laterality: Left;   INCISION AND DRAINAGE OF WOUND  04/17/2012   Procedure: IRRIGATION AND DEBRIDEMENT WOUND;  Surgeon: Theodoro Kos, DO;  Location: [place];  Service: Plastics;  Laterality: N/A;  OF ABDOMINAL WOUND, SURGICAL PREP AND PLACEMENT OF VAC, REMOVAL FOREHEAD SKIN LESION   INCISION AND  DRAINAGE OF WOUND N/A 04/24/2012   Procedure: IRRIGATION AND DEBRIDEMENT WOUND;  Surgeon: Theodoro Kos, DO;  Location: [place];  Service: Plastics;  Laterality: N/A;  I & D ABDOMINAL WOUND WITH VAC AND ACELL   INCISION AND DRAINAGE OF WOUND N/A 05/01/2012   Procedure: IRRIGATION AND DEBRIDEMENT WOUND;  Surgeon: Theodoro Kos, DO;  Location: [place];  Service: Plastics;  Laterality: N/A;  WITH SURGICAL PREP AND PLACEMENT OF VAC   INCISION AND DRAINAGE OF WOUND N/A 05/08/2012   Procedure: IRRIGATION AND DEBRIDEMENT OF ABD WOUND SURGICAL PREP AND PLACEMENT OF VAC ;  Surgeon: Theodoro Kos, DO;  Location: [place];  Service: Plastics;  Laterality: N/A;  IRRIGATION AND DEBRIDEMENT OF ABD WOUND SURGICAL PREP AND PLACEMENT OF VAC    INCISION AND DRAINAGE OF WOUND N/A 05/15/2012   Procedure: IRRIGATION AND DEBRIDEMENT OF ABDOMINAL WOUND WITH POSSIBLE SURGICAL PREP AND PLACEMENT OF VAC;  Surgeon: Theodoro Kos, DO;  Location: [place];  Service: Plastics;  Laterality: N/A;   INCISION AND DRAINAGE OF WOUND N/A 05/22/2012   Procedure: IRRIGATION AND DEBRIDEMENT OF ABODOMINAL WOUND WITH  SURGICAL PREP AND VAC PLACEMENT;  Surgeon: Theodoro Kos, DO;  Location: [place];  Service: Plastics;  Laterality: N/A;   INCISION AND DRAINAGE OF WOUND N/A 06/28/2012   Procedure: IRRIGATION AND DEBRIDEMENT OF ABDOMINAL ULCER SURGICAL PREP AND PLACEMENT OF ACELL AND VAC;  Surgeon: Theodoro Kos, DO;  Location: [place];  Service: Plastics;  Laterality: N/A;   INCISIONAL HERNIA REPAIR  02/07/2012   Procedure: HERNIA REPAIR INCISIONAL;  Surgeon: Gayland Curry, MD,FACS;  Location: [place];  Service: General;;  Open, Primary repair, strangulated Incisional hernia.   LESION REMOVAL  04/17/2012   Procedure: LESION REMOVAL;  Surgeon: Theodoro Kos, DO;  Location: [place];  Service: Plastics;  Laterality: N/A;   CENTER  OF FOREHEAD, REMOVAL FORHEAD SKIN LESION   ORIF TOE FRACTURE Left 02/09/2017   Procedure: EXCISION TALAR HEAD, INTERNAL FIXATION MEDIAL COLUMN LEFT FOOT;  Surgeon: Newt Minion, MD;  Location: [place];  Service: Orthopedics;  Laterality: Left;   POLYPECTOMY N/A 05/14/2014   Procedure: ENDOMETRIAL POLYPECTOMY;  Surgeon: Jonnie Kind, MD;  [facility];  Service: Gynecology;  Laterality: N/A;   STUMP REVISION Left 08/31/2017   Procedure: REVISION LEFT BELOW KNEE AMPUTATION;  Surgeon: Newt Minion, MD;  Location: [place];  Service: Orthopedics;  Laterality: Left;    Current Outpatient Medications  Medication Sig Dispense Refill   acetaminophen (TYLENOL) 325 MG tablet Take 650 mg by mouth 2 (two) times daily as needed (back ache).     apixaban (ELIQUIS) 5 MG TABS tablet Take 1 tablet (5 mg total) by mouth 2 (two) times daily. 28 tablet 0   atorvastatin (LIPITOR)  20 MG tablet TAKE 1 TABLET BY MOUTH ONCE  DAILY 100 tablet 1   BELSOMRA 20 MG TABS Take 20 mg by mouth at bedtime. 30 tablet 4   carvedilol (COREG) 3.125 MG tablet Take 1 tablet (3.125 mg total) by mouth 2 (two) times daily with a meal. 180 tablet 3   cetirizine (ZYRTEC) 10 MG tablet Take 10 mg by mouth daily as needed for allergies.     fenofibrate 160 MG tablet TAKE 1 TABLET BY MOUTH DAILY 100 tablet 1   fexofenadine (ALLEGRA) 180 MG tablet Take 1 tablet (180 mg total) by mouth daily. For allergy symptoms (Patient taking differently: Take 180 mg by mouth daily as needed for allergies. For allergy symptoms) 90 tablet 3   fluticasone (FLONASE) 50 MCG/ACT nasal spray Place 1 spray into both nostrils daily as needed for allergies or rhinitis.     furosemide (LASIX) 20 MG tablet Take 1 tablet (20 mg total) by mouth daily as needed. 30 tablet 6   nitroGLYCERIN (NITROSTAT) 0.4 MG SL tablet Place 1 tablet (0.4 mg total) under the tongue every 5 (five) minutes x 3 doses as needed for chest pain. If pain persist after call 911 25 tablet 4    sacubitril-valsartan (ENTRESTO) 49-51 MG Take 1 tablet by mouth 2 (two) times daily. 60 tablet 6   sertraline (ZOLOFT) 50 MG tablet TAKE 1 TABLET BY MOUTH DAILY 100 tablet 1   spironolactone (ALDACTONE) 25 MG tablet Take 1 tablet (25 mg total) by mouth daily. 90 tablet 3   No current facility-administered medications for this visit.    Allergies:   Ace inhibitors   Social History:  The patient  reports that she quit smoking about 10 years ago. Her smoking use included cigarettes. She has a 25.00 pack-year smoking history. She has been exposed to tobacco smoke. She has never used smokeless tobacco. She reports that she does not drink alcohol and does not use drugs.   Family History:  The patient's family history includes Birth defects in her son; Diabetes in her brother; Heart attack in her mother; Heart disease in her mother; Hypertension in her mother and sister; Obesity in her mother; Sudden death in her mother.  ROS:  Please see the history of present illness.    All other systems are reviewed and otherwise negative.   PHYSICAL EXAM:  VS:  There were no vitals taken for this visit. BMI: There is no height or weight on file to calculate BMI. Well nourished, well developed, in no acute distress HEENT: normocephalic, atraumatic Neck: no JVD, carotid bruits or masses Cardiac:  irreg-irreg; no significant murmurs, no rubs, or gallops Lungs:  CTA b/l, no wheezing, rhonchi or rales Abd: soft, nontender MS: prosthetic LLE Ext: +1 with some brawny type edema as well RLE Skin: warm and dry, no rash Neuro:  No gross deficits appreciated Psych: euthymic mood, full affect   EKG:  not done today  01/27/22: TTE 1. Left ventricular ejection fraction, by estimation, is 40 to 45%. Left  ventricular ejection fraction by 2D MOD biplane is 39.8 %. The left  ventricle has mildly decreased function. The left ventricle demonstrates  global hypokinesis. The left  ventricular internal cavity size was  mildly dilated. Left ventricular  diastolic function could not be evaluated.   2. Right ventricular systolic function is moderately reduced. The right  ventricular size is moderately enlarged. There is normal pulmonary artery  systolic pressure. The estimated right ventricular systolic pressure is  20.3  mmHg.   3. Left atrial size was moderately dilated.   4. Right atrial size was mildly dilated.   5. The mitral valve is grossly normal. Mild mitral valve regurgitation.  No evidence of mitral stenosis.   6. The aortic valve is tricuspid. There is mild calcification of the  aortic valve. There is mild thickening of the aortic valve. Aortic valve  regurgitation is trivial. Aortic valve sclerosis is present, with no  evidence of aortic valve stenosis.   7. The inferior vena cava is normal in size with greater than 50%  respiratory variability, suggesting right atrial pressure of 3 mmHg.   Comparison(s): The left ventricular function has improved.   TTE 09/21/21   1. Technically difficult study, consider limited echo with contrast to  better evaluation wall motion. Left ventricular ejection fraction, by  estimation, is 30 to 35%. The left ventricle has moderately decreased  function. Left ventricular endocardial  border not optimally defined to evaluate regional wall motion. There is  mild left ventricular hypertrophy. Left ventricular diastolic parameters  are indeterminate.   2. Right ventricular systolic function is normal. The right ventricular  size is mildly enlarged. Tricuspid regurgitation signal is inadequate for  assessing PA pressure.   3. Right atrial size was mildly dilated.   4. The mitral valve is normal in structure. No evidence of mitral valve  regurgitation.   5. The aortic valve was not well visualized. Aortic valve regurgitation  is trivial. No aortic stenosis is present.   6. The inferior vena cava is dilated in size with >50% respiratory  variability, suggesting  right atrial pressure of 8 mmHg.    Cardiac monitor 10/26/2021 personally reviewed 1. Predominantly NSR.  2. Atrial fibrillation noted 17% of the time, avg HR 100 bpm.  3. Frequent PVCs, 19.2% of beats.  4. 9 runs of NSVT, longest 16 beats.   Recent Labs: 09/21/2021: TSH 0.951 09/24/2021: Magnesium 2.2 10/05/2021: B Natriuretic Peptide 243.7 12/02/2021: ALT 15 12/30/2021: BUN 18; Creatinine, Ser 1.17; Hemoglobin 15.2; Platelets 282; Potassium 5.0; Sodium 143  11/04/2021: Chol/HDL Ratio 3.4; Cholesterol, Total 117; HDL 34; LDL Chol Calc (NIH) 64; Triglycerides 100   CrCl cannot be calculated (Patient's most recent lab result is older than the maximum 21 days allowed.).   Wt Readings from Last 3 Encounters:  01/06/22 295 lb (133.8 kg)  12/30/21 (!) 307 lb (139.3 kg)  12/02/21 (!) 309 lb 12.8 oz (140.5 kg)     Other studies reviewed: Additional studies/records reviewed today include: summarized above  ASSESSMENT AND PLAN:  Longstanding persistent AFib > permanent In review of record, appears felt not an ablation candidate 2/2 morbid obesity CHA2DS2Vasc is 3, on Eliquis, appropriately dosed Rate controlled today  Rate control strategy has been established.  NICM Improved LVEF by her last echo EF toward 29's Sees HF team today  CAD PVD C/w Dr. Percival Spanish and team   EP will step back though remain available in the future of needed   Disposition: F/u with Drs. Hochrien and McLean their teams as planned by them.   Current medicines are reviewed at length with the patient today.  The patient did not have any concerns regarding medicines.  Venetia Night, PA-C 01/27/2022 4:41 PM     [place] [address] [phone] (office)  [phone] (fax)

## 2022-01-29 ENCOUNTER — Telehealth (HOSPITAL_COMMUNITY): Payer: Self-pay

## 2022-01-29 ENCOUNTER — Ambulatory Visit: Payer: Medicare Other | Admitting: Physician Assistant

## 2022-01-29 ENCOUNTER — Ambulatory Visit
Admission: RE | Admit: 2022-01-29 | Discharge: 2022-01-29 | Disposition: A | Discharge status: Home / Self Care | Payer: Medicare Other | Source: Ambulatory Visit | Attending: Cardiology | Admitting: Cardiology

## 2022-01-29 ENCOUNTER — Encounter (HOSPITAL_COMMUNITY): Payer: Self-pay | Admitting: Cardiology

## 2022-01-29 ENCOUNTER — Encounter: Payer: Self-pay | Admitting: Physician Assistant

## 2022-01-29 VITALS — BP 118/72 | HR 81 | Ht 67.0 in | Wt 311.0 lb

## 2022-01-29 VITALS — BP 134/76 | HR 79 | Wt 310.8 lb

## 2022-01-29 DIAGNOSIS — E785 Hyperlipidemia, unspecified: Secondary | ICD-10-CM | POA: Diagnosis not present

## 2022-01-29 DIAGNOSIS — I5022 Chronic systolic (congestive) heart failure: Secondary | ICD-10-CM | POA: Insufficient documentation

## 2022-01-29 DIAGNOSIS — Z79899 Other long term (current) drug therapy: Secondary | ICD-10-CM | POA: Diagnosis not present

## 2022-01-29 DIAGNOSIS — M7989 Other specified soft tissue disorders: Secondary | ICD-10-CM | POA: Insufficient documentation

## 2022-01-29 DIAGNOSIS — I2510 Atherosclerotic heart disease of native coronary artery without angina pectoris: Secondary | ICD-10-CM | POA: Diagnosis not present

## 2022-01-29 DIAGNOSIS — I4811 Longstanding persistent atrial fibrillation: Secondary | ICD-10-CM | POA: Diagnosis not present

## 2022-01-29 DIAGNOSIS — M069 Rheumatoid arthritis, unspecified: Secondary | ICD-10-CM | POA: Diagnosis not present

## 2022-01-29 DIAGNOSIS — Z951 Presence of aortocoronary bypass graft: Secondary | ICD-10-CM | POA: Diagnosis not present

## 2022-01-29 DIAGNOSIS — I480 Paroxysmal atrial fibrillation: Secondary | ICD-10-CM | POA: Diagnosis not present

## 2022-01-29 DIAGNOSIS — I110 Hypertensive heart disease with heart failure: Secondary | ICD-10-CM | POA: Diagnosis not present

## 2022-01-29 DIAGNOSIS — Z7901 Long term (current) use of anticoagulants: Secondary | ICD-10-CM | POA: Insufficient documentation

## 2022-01-29 DIAGNOSIS — I739 Peripheral vascular disease, unspecified: Secondary | ICD-10-CM | POA: Insufficient documentation

## 2022-01-29 DIAGNOSIS — Z89512 Acquired absence of left leg below knee: Secondary | ICD-10-CM | POA: Insufficient documentation

## 2022-01-29 LAB — CBC
HCT: 50.9 % — ABNORMAL HIGH (ref 36.0–46.0)
Hemoglobin: 15.8 g/dL — ABNORMAL HIGH (ref 12.0–15.0)
MCH: 28.3 pg (ref 26.0–34.0)
MCHC: 31 g/dL (ref 30.0–36.0)
MCV: 91.2 fL (ref 80.0–100.0)
Platelets: 284 10*3/uL (ref 150–400)
RBC: 5.58 MIL/uL — ABNORMAL HIGH (ref 3.87–5.11)
RDW: 18.2 % — ABNORMAL HIGH (ref 11.5–15.5)
WBC: 15.5 10*3/uL — ABNORMAL HIGH (ref 4.0–10.5)
nRBC: 0 % (ref 0.0–0.2)

## 2022-01-29 LAB — HEMOGLOBIN A1C
Hgb A1c MFr Bld: 5.9 % — ABNORMAL HIGH (ref 4.8–5.6)
Mean Plasma Glucose: 122.63 mg/dL

## 2022-01-29 LAB — BASIC METABOLIC PANEL
Anion gap: 9 (ref 5–15)
BUN: 20 mg/dL (ref 8–23)
CO2: 25 mmol/L (ref 22–32)
Calcium: 9.2 mg/dL (ref 8.9–10.3)
Chloride: 105 mmol/L (ref 98–111)
Creatinine, Ser: 1.25 mg/dL — ABNORMAL HIGH (ref 0.44–1.00)
GFR, Estimated: 47 mL/min — ABNORMAL LOW (ref >60)
Glucose, Bld: 79 mg/dL (ref 70–99)
Potassium: 4.5 mmol/L (ref 3.5–5.1)
Sodium: 139 mmol/L (ref 135–145)

## 2022-01-29 LAB — BRAIN NATRIURETIC PEPTIDE: B Natriuretic Peptide: 144.9 pg/mL — ABNORMAL HIGH (ref 0.0–100.0)

## 2022-01-29 NOTE — Progress Notes (Signed)
Medication Samples have been provided to the patient.  Drug name: Eliquis        Strength: 5 mg        Qty: 4  LOT: [id number]  Exp.Date: 07/2023  Dosing instructions: Take 1 tablet Twice daily   The patient has been instructed regarding the correct time, dose, and frequency of taking this medication, including desired effects and most common side effects.   Juanita Laster [place] 10:03 AM 01/29/2022

## 2022-01-29 NOTE — Patient Instructions (Addendum)
Medication Instructions:   Your physician recommends that you continue on your current medications as directed. Please refer to the Current Medication list given to you today.  *If you need a refill on your cardiac medications before your next appointment, please call your pharmacy*   Lab Work: [place]    If you have labs (blood work) drawn today and your tests are completely normal, you will receive your results only by: [place] (if you have MyChart) OR A paper copy in the mail If you have any lab test that is abnormal or we need to change your treatment, we will call you to review the results.   Testing/Procedures: NONE ORDERED  TODAY     Follow-Up: At [facility], you and your health needs are our priority.  As part of our continuing mission to provide you with exceptional heart care, we have created designated Provider Care Teams.  These Care Teams include your primary Cardiologist (physician) and Advanced Practice Providers (APPs -  Physician Assistants and Nurse Practitioners) who all work together to provide you with the care you need, when you need it.  We recommend signing up for the patient portal called "MyChart".  Sign up information is provided on this After Visit Summary.  MyChart is used to connect with patients for Virtual Visits (Telemedicine).  Patients are able to view lab/test results, encounter notes, upcoming appointments, etc.  Non-urgent messages can be sent to your provider as well.   To learn more about what you can do with MyChart, go to [email or web address].    Your next appointment:  .CONTACT [facility] [phone] AS NEEDED FOR  ANY CARDIAC RELATED SYMPTOMS  The format for your next appointment:  NONE ORDERED  TODAY   Other Instructions   Important Information About [place]

## 2022-01-29 NOTE — Progress Notes (Signed)
Medication Samples have been provided to the patient.  Drug name: Wilder Glade        Strength: 10 mg        Qty: 1  LOT: [id number]  Exp.Date: 06/2024  Dosing instructions: Take 1 tablet daily  The patient has been instructed regarding the correct time, dose, and frequency of taking this medication, including desired effects and most common side effects.   Julia West 10:01 AM 01/29/2022

## 2022-01-29 NOTE — Telephone Encounter (Signed)
Patient aware and agreeable. 

## 2022-01-29 NOTE — Patient Instructions (Addendum)
EKG done today.  Labs done today. We will contact you only if your labs are abnormal.  No medication changes were made. Please continue all current medications as prescribed.  Your physician has requested that you have an ankle brachial index (ABI). During this test an ultrasound and blood pressure cuff are used to evaluate the arteries that supply the arms and legs with blood. Allow thirty minutes for this exam. There are no restrictions or special instructions. [facility] will contact you to schedule this appointment.   Your physician recommends that you schedule a follow-up appointment in: 10 days for a lab only appointment and in 1 month for a lab only appointment.   If you have any questions or concerns before your next appointment please send [place] a message through [place] or call our office at [phone].    TO LEAVE A MESSAGE FOR THE NURSE SELECT OPTION 2, PLEASE LEAVE A MESSAGE INCLUDING: YOUR NAME DATE OF BIRTH CALL BACK NUMBER REASON FOR CALL**this is important as we prioritize the call backs  YOU WILL RECEIVE A CALL BACK THE SAME DAY AS LONG AS YOU CALL BEFORE 4:00 PM   Do the following things EVERYDAY: Weigh yourself in the morning before breakfast. Write it down and keep it in a log. Take your medicines as prescribed Eat low salt foods--Limit salt (sodium) to 2000 mg per day.  Stay as active as you can everyday Limit all fluids for the day to less than 2 liters   At the [facility], you and your health needs are our priority. As part of our continuing mission to provide you with exceptional heart care, we have created designated Provider Care Teams. These Care Teams include your primary Cardiologist (physician) and Advanced Practice Providers (APPs- Physician Assistants and Nurse Practitioners) who all work together to provide you with the care you need, when you need it.   You may see any of the following providers on your designated Care Team at  your next follow up: Dr Glori Bickers Dr Haynes Kerns, NP Lyda Jester, [place] Audry Riles, PharmD   Please be sure to bring in all your medications bottles to every appointment.   You are scheduled for a Cardiac Catheterization on Friday, December 15 with Dr. Loralie Champagne.  1. Please arrive at the [facility] (Main Entrance A) at [facility]: [address] [place], [place] [address] at 8:30 AM (This time is two hours before your procedure to ensure your preparation). Free valet parking service is available.   Special note: Every effort is made to have your procedure done on time. Please understand that emergencies sometimes delay scheduled procedures.  2. Diet: Do not eat solid foods after midnight.  The patient may have clear liquids until 5am upon the day of the procedure.   3. Medication instructions in preparation for your procedure:  DO NOT TAKE YOUR ELIQUIS 2 DAYS PRIOR TO YOUR PROCEDURE AND THE MORNING OF YOUR PROCEDURE.  DO NOT TAKE YOUR LASIX THE MORNING OF YOUR PROCEDURE.   On the morning of your procedure, take your and any morning medicines NOT listed above.  You may use sips of water.  4. Plan for one night stay--bring personal belongings. 5. Bring a current list of your medications and current insurance cards. 6. You MUST have a responsible person to drive you home. 7. Someone MUST be with you the first 24 hours after you arrive home or your discharge will be delayed. 8.  Please wear clothes that are easy to get on and off and wear slip-on shoes.  Thank you for allowing [place] to care for you!   -- [place] [facility]

## 2022-01-31 NOTE — Progress Notes (Signed)
Primary Care: [name], MD HF Cardiology: Dr Aundra Dubin  HPI: Referred to clinic initially by Dr Cathlean Sauer for heart failure consultation.   Julia West is a 66 y.o. with a history of PAF,  HTN, CAD, CABG  2003, hyperlipidemia, LBKA, lymphedema RLE/RUE, and chronic HFrEF.   Admitted in 7/23 with atrial fibrillation/RVR and acute systolic CHF. Diuresed with IV lasix. Echo showed newly reduced EF 30-35%. Had DC-CV 09/22/21 with restoration of SR. Plan to f/u with Dr Myrtie Neither for ablation. Started on SGLT2i and MRA. Discharged on BB and ARB. Discharge weight 305 pounds.   Zio in 8/23 showed 19.2% PVCs.  She was transitioned from Tikosyn to amiodarone.  Atrial fibrillation was persistent, she failed DCCV in 10/23.  She saw Dr. Curt Bears, decided against ablation due to obesity.  Amiodarone was stopped. She has been in rate-controlled AF since that time.    She returns for followup of CHF. She is using CPAP every night.  She has a prosthetic left lower leg.  She uses crutches for balance when she walks. No chest pain.  She does get dyspnea walking around her house, this is worse over the  last few weeks. She is short of breath showering and dressing.  No orthopnea/PND.  She uses Lasix 1-2 times/week.  No orthopnea/PND.  Lives with husband.  Nonsmoker.   Labs (8/23): LDL 64 Labs (10/23): K 5, creatinine 1.17  ECG (personally reviewed): atrial fibrillation 82, left axis deviation, LVH  PMH: 1. CAD: CABG x 2 in 2003 with LIMA-LAD, free RIMA-LCX 2. Rheumatoid arthritis 3. Left Charcot foot with left BKA.  4. HTN 5. Prior smoker 6. Atrial fibrillation: Chronic.  Has failed Tikosyn, amiodarone, and DCCV.  7. PVCs: Zio (8/23) with 19.2% PVCs.  8. OSA: CPAP.  9. Chronic systolic CHF: ?Ischemic cardiomyopathy versus tachycardia-mediated.   - Echo 4/22: EF 50-55% - Echo 7/23: EF 30-35% RV normal  - Echo 11/23: EF 40-45%, mild LV dilation, moderate RV enlargement, moderately decreased RV systolic  function, mild MR, IVC normal.  10. Hyperlipidemia   Review of Systems:  All systems reviewed and negative except as per HPI.     Current Outpatient Medications  Medication Sig Dispense Refill   acetaminophen (TYLENOL) 325 MG tablet Take 650 mg by mouth 2 (two) times daily as needed (back ache).     apixaban (ELIQUIS) 5 MG TABS tablet Take 1 tablet (5 mg total) by mouth 2 (two) times daily. 28 tablet 0   atorvastatin (LIPITOR) 20 MG tablet TAKE 1 TABLET BY MOUTH ONCE  DAILY 100 tablet 1   carvedilol (COREG) 3.125 MG tablet Take 1 tablet (3.125 mg total) by mouth 2 (two) times daily with a meal. 180 tablet 3   cetirizine (ZYRTEC) 10 MG tablet Take 10 mg by mouth daily as needed for allergies.     fenofibrate 160 MG tablet TAKE 1 TABLET BY MOUTH DAILY 100 tablet 1   fexofenadine (ALLEGRA) 180 MG tablet Take 1 tablet (180 mg total) by mouth daily. For allergy symptoms (Patient taking differently: Take 180 mg by mouth daily as needed for allergies. For allergy symptoms) 90 tablet 3   fluticasone (FLONASE) 50 MCG/ACT nasal spray Place 1 spray into both nostrils daily as needed for allergies or rhinitis.     furosemide (LASIX) 20 MG tablet Take 1 tablet (20 mg total) by mouth daily as needed. 30 tablet 6   nitroGLYCERIN (NITROSTAT) 0.4 MG SL tablet Place 1 tablet (0.4 mg total) under the  tongue every 5 (five) minutes x 3 doses as needed for chest pain. If pain persist after call 911 25 tablet 4   sacubitril-valsartan (ENTRESTO) 49-51 MG Take 1 tablet by mouth 2 (two) times daily. 60 tablet 6   sertraline (ZOLOFT) 50 MG tablet TAKE 1 TABLET BY MOUTH DAILY 100 tablet 1   spironolactone (ALDACTONE) 25 MG tablet Take 1 tablet (25 mg total) by mouth daily. 90 tablet 3   No current facility-administered medications for this encounter.    Allergies  Allergen Reactions   Ace Inhibitors Cough      Social History   Socioeconomic History   Marital status: Married    Spouse name: Eddie Dibbles   Number of  children: 5   Years of education: Not on file   Highest education level: GED or equivalent  Occupational History   Occupation: Estate manager/land agent    Comment: In the [employer]   Occupation: retired  Tobacco Use   Smoking status: Former    Packs/day: 1.00    Years: 25.00    Total pack years: 25.00    Types: Cigarettes    Quit date: 11/17/2011    Years since quitting: 10.2    Passive exposure: Current   Smokeless tobacco: Never  Vaping Use   Vaping Use: Never used  Substance and Sexual Activity   Alcohol use: No   Drug use: No   Sexual activity: Yes    Birth control/protection: Post-menopausal  Other Topics Concern   Not on file  Social History Narrative   Lives in [place] with her husband. 5 children. One of whom has pulmonary stenosis.    Left BKA, uses crutches - doesn't get much exercise   Social Determinants of Health   Financial Resource Strain: Low Risk  (09/22/2021)   Overall Financial Resource Strain (CARDIA)    Difficulty of Paying Living Expenses: Not very hard  Food Insecurity: No Food Insecurity (09/22/2021)   Hunger Vital Sign    Worried About Running Out of Food in the Last Year: Never true    Ran Out of Food in the Last Year: Never true  Transportation Needs: No Transportation Needs (09/22/2021)   PRAPARE - Hydrologist (Medical): No    Lack of Transportation (Non-Medical): No  Physical Activity: Insufficiently Active (10/01/2020)   Exercise Vital Sign    Days of Exercise per Week: 7 days    Minutes of Exercise per Session: 10 min  Stress: No Stress Concern Present (10/01/2020)   [place]    Feeling of Stress : Not at all  Social Connections: Moderately Isolated (10/01/2020)   Social Connection and Isolation Panel [NHANES]    Frequency of Communication with Friends and Family: More than three times a week    Frequency of Social Gatherings with Friends and Family:  Twice a week    Attends Religious Services: Never    Marine scientist or Organizations: No    Attends Archivist Meetings: Never    Marital Status: Married  Human resources officer Violence: Not At Risk (10/01/2020)   Humiliation, Afraid, Rape, and Kick questionnaire    Fear of Current or Ex-Partner: No    Emotionally Abused: No    Physically Abused: No    Sexually Abused: No      Family History  Problem Relation Age of Onset   Heart attack Mother    Hypertension Mother    Heart disease Mother  Sudden death Mother    Obesity Mother    Hypertension Sister    Diabetes Brother    Birth defects Son        heart defect    Vitals:   01/29/22 0849  BP: 134/76  Pulse: 79  SpO2: 96%  Weight: (!) 141 kg (310 lb 12.8 oz)   Wt Readings from Last 3 Encounters:  01/29/22 (!) 141 kg (310 lb 12.8 oz)  01/29/22 (!) 141.1 kg (311 lb)  01/06/22 133.8 kg (295 lb)   PHYSICAL EXAM: General: NAD, obese Neck: JVP 8 cm, no thyromegaly or thyroid nodule.  Lungs: Clear to auscultation bilaterally with normal respiratory effort. CV: Nondisplaced PMI.  Heart regular S1/S2, no S3/S4, no murmur.  1+ edema to right knee, left BKA.  No carotid bruit.  Difficult to palpate right pedal pulses.  Abdomen: Soft, nontender, no hepatosplenomegaly, no distention.  Skin: Intact without lesions or rashes.  Neurologic: Alert and oriented x 3.  Psych: Normal affect. Extremities: Left BKA  HEENT: Normal.   ASSESSMENT & PLAN: 1. Chronic systolic CHF:  Echo 04/7739 down to 30-35% from previous 50-55% back 2022.  Patient was in atrial fibrillation with RVR, ?tachy-mediated cardiomyopathy.  Echo in 11/23 with controlled rate (still in atrial fibrillation) showed EF still low at 40-45%, mild LV dilation, moderate RV dilation and moderately decreased RV systolic function.  Also possible that PVCs play a role as well as CAD (history of CABG).  NYHA class III symptoms.  She looks at least mildly volume  overloaded.   - She can continue prn Lasix but I would like her to start Farxiga 10 mg daily. BMET/BNP today and BMET in 10 days.   - Continue carvedilol 3.125 mg twice a day  - Continue Entresto 49/51 bid.   - Continue spironolactone 25 mg daily.  - I will arrange for LHC/RHC to assess filling pressures and assess for worsening of CAD. Hold Eliquis 2 days prior to procedure. Discussed risks/benefits and she agrees to procedure.  2. Atrial fibrillation: Now chronic.  She has failed amiodarone and Tikosyn as well as DCCV.  She saw EP and was deemed to be not a candidate for ablation due to obesity.  - Would reassess ablation vs cardioversion if she could lose some weight.  - Continue Eliquis.  - good rate control on Coreg.  3. Obesity: Body mass index is 48.68 kg/m. - Check hgbA1c to see if she can get semaglutide.  4. PVCs: 19.2% PVCs on 8/23 Zio.  She is now off amiodarone. Question if PVCs play a role in cardiomyopathy.  - Would repeat Zio monitor at next appt to reassess PVCs, ?restart amiodarone.  5. OSA: Continue CPAP.  6. CAD: CABG 2003.  No chest pain but has significant exertional dyspnea.  - Not on ASA given apixaban use.  - Continue atorvastatin.  Good lipids in 8/23.  7. 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  Hard to feel right pedal pulses.  - Will get ABIs to assess circulation in right leg.    Followup 1 month APP.   Julia West  01/31/2022

## 2022-02-08 ENCOUNTER — Other Ambulatory Visit (HOSPITAL_COMMUNITY): Payer: Self-pay | Admitting: Cardiology

## 2022-02-08 DIAGNOSIS — I739 Peripheral vascular disease, unspecified: Secondary | ICD-10-CM

## 2022-02-09 ENCOUNTER — Telehealth (HOSPITAL_COMMUNITY): Payer: Self-pay | Admitting: *Deleted

## 2022-02-09 DIAGNOSIS — G4733 Obstructive sleep apnea (adult) (pediatric): Secondary | ICD-10-CM | POA: Diagnosis not present

## 2022-02-11 ENCOUNTER — Ambulatory Visit: Payer: Medicare Other | Admitting: Pharmacist

## 2022-02-11 ENCOUNTER — Encounter (HOSPITAL_COMMUNITY): Payer: Self-pay

## 2022-02-12 ENCOUNTER — Other Ambulatory Visit (HOSPITAL_COMMUNITY): Payer: Self-pay

## 2022-02-15 ENCOUNTER — Encounter (HOSPITAL_COMMUNITY): Payer: Self-pay

## 2022-02-15 ENCOUNTER — Other Ambulatory Visit (HOSPITAL_COMMUNITY): Payer: Self-pay | Admitting: *Deleted

## 2022-02-15 DIAGNOSIS — I5022 Chronic systolic (congestive) heart failure: Secondary | ICD-10-CM

## 2022-02-18 ENCOUNTER — Other Ambulatory Visit: Payer: Self-pay

## 2022-02-18 ENCOUNTER — Ambulatory Visit (HOSPITAL_COMMUNITY)
Admission: RE | Admit: 2022-02-18 | Discharge: 2022-02-18 | Disposition: A | Discharge status: Home / Self Care | Payer: Medicare Other | Source: Ambulatory Visit | Attending: Cardiovascular Disease | Admitting: Cardiovascular Disease

## 2022-02-18 DIAGNOSIS — M7989 Other specified soft tissue disorders: Secondary | ICD-10-CM | POA: Insufficient documentation

## 2022-02-18 DIAGNOSIS — I5022 Chronic systolic (congestive) heart failure: Secondary | ICD-10-CM

## 2022-02-18 DIAGNOSIS — I739 Peripheral vascular disease, unspecified: Secondary | ICD-10-CM | POA: Insufficient documentation

## 2022-02-19 ENCOUNTER — Telehealth (HOSPITAL_COMMUNITY): Payer: Self-pay

## 2022-02-19 LAB — BASIC METABOLIC PANEL
BUN/Creatinine Ratio: 17 (ref 12–28)
BUN: 21 mg/dL (ref 8–27)
CO2: 22 mmol/L (ref 20–29)
Calcium: 9.3 mg/dL (ref 8.7–10.3)
Chloride: 103 mmol/L (ref 96–106)
Creatinine, Ser: 1.23 mg/dL — ABNORMAL HIGH (ref 0.57–1.00)
Glucose: 110 mg/dL — ABNORMAL HIGH (ref 70–99)
Potassium: 4.8 mmol/L (ref 3.5–5.2)
Sodium: 140 mmol/L (ref 134–144)
eGFR: 48 mL/min/{1.73_m2} — ABNORMAL LOW (ref >59)

## 2022-02-19 NOTE — Telephone Encounter (Signed)
Patient aware of results.

## 2022-02-20 ENCOUNTER — Encounter (HOSPITAL_COMMUNITY): Payer: Self-pay

## 2022-02-23 NOTE — Telephone Encounter (Signed)
Per Adolm Joseph  CMA case is in appeals Turn around time can take up to 45 days will cancel procedure at this time and reschedule once approval from insurance has been received

## 2022-02-26 ENCOUNTER — Ambulatory Visit (HOSPITAL_COMMUNITY): Admission: RE | Admit: 2022-02-26 | Payer: Medicare Other | Source: Home / Self Care | Admitting: Cardiology

## 2022-02-26 ENCOUNTER — Encounter (HOSPITAL_COMMUNITY): Admission: RE | Payer: Self-pay | Source: Home / Self Care

## 2022-02-26 SURGERY — RIGHT/LEFT HEART CATH AND CORONARY/GRAFT ANGIOGRAPHY
Anesthesia: LOCAL

## 2022-03-01 ENCOUNTER — Other Ambulatory Visit (HOSPITAL_COMMUNITY): Payer: Self-pay

## 2022-03-02 ENCOUNTER — Other Ambulatory Visit: Payer: Medicare Other

## 2022-03-02 ENCOUNTER — Encounter (HOSPITAL_COMMUNITY): Payer: Medicare Other

## 2022-03-02 ENCOUNTER — Telehealth (HOSPITAL_COMMUNITY): Payer: Self-pay

## 2022-03-02 NOTE — Telephone Encounter (Signed)
Patient called and stated she checked on her [place] and was told it was denied. She asked if we can follow up on that. She also stated she checked on the [facility] and they are out of refills for this.   Is this something you would handle??

## 2022-03-10 ENCOUNTER — Other Ambulatory Visit (HOSPITAL_COMMUNITY): Payer: Self-pay | Admitting: *Deleted

## 2022-03-10 ENCOUNTER — Telehealth: Payer: Self-pay | Admitting: Cardiology

## 2022-03-10 MED ORDER — SACUBITRIL-VALSARTAN 49-51 MG PO TABS: 1.0000 | ORAL_TABLET | Freq: Two times a day (BID) | ORAL | 6 refills | Status: DC

## 2022-03-10 NOTE — Telephone Encounter (Signed)
  [employer]Phone: [phone] Fax: [phone]. Called and spoke with representative [place] she states that this has not been filled/shipped she will put it on the list to be shipped today. Pt should receive 03-16-22.  Tried to call pt, phone just keeps ringing. Will try to call again later.

## 2022-03-10 NOTE — Telephone Encounter (Signed)
Pt notified she states that she has enough medication to last until then. She will call back with anything else needed

## 2022-03-10 NOTE — Telephone Encounter (Signed)
Pt c/o medication issue:  1. Name of Medication:   sacubitril-valsartan (ENTRESTO) 49-51 MG    2. How are you currently taking this medication (dosage and times per day)? As prescribed   3. Are you having a reaction (difficulty breathing--STAT)? No   4. What is your medication issue? Patient is calling stating her [employer] application was approved in November. She is requesting a callback to discuss how she goes about getting the medication now due to only having 4 tablets left of the 30 day free trial received. Please advise.

## 2022-03-11 ENCOUNTER — Other Ambulatory Visit (HOSPITAL_COMMUNITY): Payer: Self-pay

## 2022-03-11 DIAGNOSIS — G4733 Obstructive sleep apnea (adult) (pediatric): Secondary | ICD-10-CM | POA: Diagnosis not present

## 2022-03-11 MED ORDER — SACUBITRIL-VALSARTAN 49-51 MG PO TABS: 1.0000 | ORAL_TABLET | Freq: Two times a day (BID) | ORAL | 2 refills | Status: DC

## 2022-03-14 DIAGNOSIS — G4733 Obstructive sleep apnea (adult) (pediatric): Secondary | ICD-10-CM | POA: Diagnosis not present

## 2022-03-15 NOTE — Progress Notes (Signed)
SLEEP MEDICINE VIRTUAL CONSULT NOTE via Video Note   Because of Julia West's co-morbid illnesses, she is at least at moderate risk for complications without adequate follow up.  This format is felt to be most appropriate for this patient at this time.  All issues noted in this document were discussed and addressed.  A limited physical exam was performed with this format.  Please refer to the patient's chart for her consent to telehealth for [facility].      Date:  03/17/2022   ID:  Julia West, DOB December 11, 1955, MRN [id number] The patient was identified using 2 identifiers.  Patient Location: Home Provider Location: Home Office   PCP:  [name], [place] [facility] Cardiologist:  [name], MD Electrophysiologist:  [name], MD     Evaluation Performed:  New Patient Evaluation  Chief Complaint:  OSA  History of Present Illness:    Julia West is a 67 y.o. female who is being seen today for the evaluation of OSA at the request of [name], NP.  [name] is a 67 y.o. female with a history of paroxysmal atrial fibrillation, hypertension, CAD status post remote CABG in 2003, hyperlipidemia, chronic lymphedema and chronic HFrEF.  Last visit with [name] in July 2023 she complained of problems with snoring.  Given her problems with atrial fibrillation in the past as well as chronic HFrEF a home sleep study was ordered.  Showed severe obstructive sleep apnea with an AHI of 68.8/h and O2 saturations as low as 69% meeting criteria for nocturnal hypoxemia with O2 saturations less than 88% for 272 minutes.  She underwent CPAP titration to 8 cm H2O.  She was started on CPAP therapy.  She is now referred for sleep medicine consult to establish sleep medicine care.  She is doing well with her PAP device and thinks that she has gotten used to it.  She tolerates the mask and feels the pressure is adequate.   Since going on PAP she feels rested in the am and has no significant daytime sleepiness.  She denies any significant mouth or nasal congestion.  She does not think that he snores.  She is getting a rash over the bridge of her nose from her mask that bothers her. She has had some problems with dry nose.    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    S/p CABG x 2 in 2003   Cancer [facility])    basal cell face   Complication of anesthesia    states low O2 sats post-op 11/13, always slow to awaken   Degenerative joint disease    Dehiscence of amputation stump ([place])    left BKA   Depression    Fatty liver    HTN (hypertension)    Hyperlipidemia LDL goal <70    Incarcerated hernia    Internal and external hemorrhoids without complication    Lymphedema of arm    right   Morbid obesity (HCC)    Rheumatoid arthritis ([place])    Surgical wound, non healing ABDOMINAL    has wound vac @ 125 mm Hg   Ventral hernia    Past Surgical History:  Procedure Laterality Date   AMPUTATION Left 05/06/2017   Procedure: BELOW KNEE AMPUTATION;  Surgeon: Newt Minion, MD;  Location: [place];  Service: Orthopedics;  Laterality: Left;  APPLICATION OF WOUND VAC     BOWEL RESECTION  02/07/2012   Procedure: SMALL BOWEL RESECTION;  Surgeon: Gayland Curry, MD,FACS;  Location: [place];  Service: General;;   CARDIOVASCULAR STRESS TEST  01-17-2012  DR HOCHREIN   LOW RISK NUCLEAR TEST   CARDIOVERSION N/A 07/14/2020   Procedure: CARDIOVERSION;  Surgeon: Arnoldo Lenis, MD;  [facility]: [facility];  Service: Endoscopy;  Laterality: N/A;   CARDIOVERSION N/A 09/22/2021   Procedure: CARDIOVERSION;  Surgeon: Jerline Pain, MD;  [facility];  Service: Cardiovascular;  Laterality: N/A;   CARDIOVERSION N/A 12/30/2021   Procedure: CARDIOVERSION;  Surgeon: Geralynn Rile, MD;  Location: [place];  Service: Cardiovascular;  Laterality: N/A;   CESAREAN SECTION     x 4 in  remote past   [place] GRAFT  2003   by Dr. Servando Snare. LIMA to the LAD, free RIMA to the circumflex. Stress perfusion study December 2009 with no high-risk areas of ischemia. She has a well-preserved ejection fraction   HYSTEROSCOPY WITH D & C N/A 05/14/2014   Procedure: DILATATION AND CURETTAGE (no specimen); HYSTEROSCOPY;  Surgeon: Jonnie Kind, MD;  Location: [facility];  Service: Gynecology;  Laterality: N/A;   I & D EXTREMITY Left 06/21/2017   Procedure: IRRIGATION AND DEBRIDEMENT OF LEFT LEG AMPUTATION SITE;  Surgeon: Wallace Going, DO;  Location: [facility];  Service: Plastics;  Laterality: Left;   INCISION AND DRAINAGE OF WOUND  04/17/2012   Procedure: IRRIGATION AND DEBRIDEMENT WOUND;  Surgeon: Theodoro Kos, DO;  Location: [place];  Service: Plastics;  Laterality: N/A;  OF ABDOMINAL WOUND, SURGICAL PREP AND PLACEMENT OF VAC, REMOVAL FOREHEAD SKIN LESION   INCISION AND DRAINAGE OF WOUND N/A 04/24/2012   Procedure: IRRIGATION AND DEBRIDEMENT WOUND;  Surgeon: Theodoro Kos, DO;  Location: [place];  Service: Plastics;  Laterality: N/A;  I & D ABDOMINAL WOUND WITH VAC AND ACELL   INCISION AND DRAINAGE OF WOUND N/A 05/01/2012   Procedure: IRRIGATION AND DEBRIDEMENT WOUND;  Surgeon: Theodoro Kos, DO;  Location: [place];  Service: Plastics;  Laterality: N/A;  WITH SURGICAL PREP AND PLACEMENT OF VAC   INCISION AND DRAINAGE OF WOUND N/A 05/08/2012   Procedure: IRRIGATION AND DEBRIDEMENT OF ABD WOUND SURGICAL PREP AND PLACEMENT OF VAC ;  Surgeon: Theodoro Kos, DO;  Location: [place];  Service: Plastics;  Laterality: N/A;  IRRIGATION AND DEBRIDEMENT OF ABD WOUND SURGICAL PREP AND PLACEMENT OF VAC    INCISION AND DRAINAGE OF WOUND N/A 05/15/2012   Procedure: IRRIGATION AND DEBRIDEMENT OF ABDOMINAL WOUND WITH POSSIBLE SURGICAL PREP AND PLACEMENT OF VAC;  Surgeon: Theodoro Kos, DO;  Location: [place];  Service: Plastics;  Laterality: N/A;   INCISION AND DRAINAGE OF WOUND N/A 05/22/2012   Procedure: IRRIGATION AND DEBRIDEMENT OF ABODOMINAL WOUND WITH  SURGICAL PREP AND VAC PLACEMENT;  Surgeon: Theodoro Kos, DO;  Location: [place];  Service: Plastics;  Laterality: N/A;   INCISION AND DRAINAGE OF WOUND N/A 06/28/2012   Procedure: IRRIGATION AND DEBRIDEMENT OF ABDOMINAL ULCER SURGICAL PREP AND PLACEMENT OF ACELL AND VAC;  Surgeon: Theodoro Kos, DO;  Location: [place];  Service: Plastics;  Laterality: N/A;   INCISIONAL HERNIA REPAIR  02/07/2012   Procedure: HERNIA REPAIR INCISIONAL;  Surgeon: [place] Curry, MD,FACS;  Location: [place];  Service: General;;  Open, Primary repair, strangulated Incisional hernia.  LESION REMOVAL  04/17/2012   Procedure: LESION REMOVAL;  Surgeon: Theodoro Kos, DO;  Location: [place];  Service: Plastics;  Laterality: N/A;   CENTER OF FOREHEAD, REMOVAL FORHEAD SKIN LESION   ORIF TOE FRACTURE Left 02/09/2017   Procedure: EXCISION TALAR HEAD, INTERNAL FIXATION MEDIAL COLUMN LEFT FOOT;  Surgeon: Newt Minion, MD;  Location: [place];  Service: Orthopedics;  Laterality: Left;   POLYPECTOMY N/A 05/14/2014   Procedure: ENDOMETRIAL POLYPECTOMY;  Surgeon: Jonnie Kind, MD;  Location: [facility];  Service: Gynecology;  Laterality: N/A;   STUMP REVISION Left 08/31/2017   Procedure: REVISION LEFT BELOW KNEE AMPUTATION;  Surgeon: Newt Minion, MD;  Location: [place];  Service: Orthopedics;  Laterality: Left;     Current Meds  Medication Sig   acetaminophen (TYLENOL) 325 MG tablet Take 650 mg by mouth 2 (two) times daily as needed (back ache).     Allergies:   Ace inhibitors   Social History   Tobacco Use   Smoking status: Former    Packs/day: 1.00    Years: 25.00    Total pack years: 25.00    Types: Cigarettes    Quit date: 11/17/2011    Years since quitting: 10.3    Passive exposure: Current    Smokeless tobacco: Never  Vaping Use   Vaping Use: Never used  Substance Use Topics   Alcohol use: No   Drug use: No     Family Hx: The patient's family history includes Birth defects in her son; Diabetes in her brother; Heart attack in her mother; Heart disease in her mother; Hypertension in her mother and sister; Obesity in her mother; Sudden death in her mother.  ROS:   Please see the history of present illness.     All other systems reviewed and are negative.   Prior Sleep studies:   The following studies were reviewed today:  Sleep study, CPAP titration, PAP compliance download  Labs/Other Tests and Data Reviewed:     Recent Labs: 09/21/2021: TSH 0.951 09/24/2021: Magnesium 2.2 12/02/2021: ALT 15 01/29/2022: B Natriuretic Peptide 144.9; Hemoglobin 15.8; Platelets 284 02/18/2022: BUN 21; Creatinine, Ser 1.23; Potassium 4.8; Sodium 140    Wt Readings from Last 3 Encounters:  03/17/22 (!) 311 lb (141.1 kg)  01/29/22 (!) 310 lb 12.8 oz (141 kg)  01/29/22 (!) 311 lb (141.1 kg)     Risk Assessment/Calculations:    CHA2DS2-VASc Score = 4   This indicates a 4.8% annual risk of stroke. The patient's score is based upon: CHF History: 0 HTN History: 1 Diabetes History: 0 Stroke History: 0 Vascular Disease History: 1 Age Score: 1 Gender Score: 1         Objective:    Vital Signs:  BP 136/85   Pulse 71   Ht '5\' 7"'$  (1.702 m)   Wt (!) 311 lb (141.1 kg)   BMI 48.71 kg/m    VITAL SIGNS:  reviewed GEN:  no acute distress EYES:  sclerae anicteric, EOMI - Extraocular Movements Intact RESPIRATORY:  normal respiratory effort, symmetric expansion CARDIOVASCULAR:  no peripheral edema SKIN:  no rash, lesions or ulcers. MUSCULOSKELETAL:  no obvious deformities. NEURO:  alert and oriented x 3, no obvious focal deficit PSYCH:  normal affect  ASSESSMENT & PLAN:    OSA - The patient is tolerating PAP therapy well without any problems. The PAP download performed by his  DME was personally reviewed and interpreted by me today and showed an AHI of 0.9/hr  on 8 cm H2O with 100% compliance in using more than 4 hours nightly.  The patient has been using and benefiting from PAP use and will continue to benefit from therapy.  -I will order an Under the nose FFM since she is having issues with a rash over the bridge of her nose -encouraged her to use nasal saline spray 2 sprays each nostril daily -I also encouraged her to adjust her humidity to help with dry nose   HTN -BP controlled -Continue prescription drug management with carvedilol 3.125 mg twice daily, Entresto 49-51 mg twice daily and spironolactone 25 mg daily with as needed refills   Time:   Today, I have spent 15 minutes with the patient with telehealth technology discussing the above problems.     Medication Adjustments/Labs and Tests Ordered: Current medicines are reviewed at length with the patient today.  Concerns regarding medicines are outlined above.   Tests Ordered: No orders of the defined types were placed in this encounter.   Medication Changes: No orders of the defined types were placed in this encounter.   Follow Up:  In Person in 1 year(s)  Signed, Fransico Him, MD  03/17/2022 9:23 AM    [place]

## 2022-03-17 ENCOUNTER — Ambulatory Visit: Payer: Medicare Other | Attending: Cardiology | Admitting: Cardiology

## 2022-03-17 ENCOUNTER — Telehealth (HOSPITAL_COMMUNITY): Payer: Self-pay | Admitting: *Deleted

## 2022-03-17 ENCOUNTER — Encounter (HOSPITAL_COMMUNITY): Payer: Self-pay | Admitting: *Deleted

## 2022-03-17 ENCOUNTER — Encounter: Payer: Self-pay | Admitting: Cardiology

## 2022-03-17 ENCOUNTER — Telehealth: Payer: Self-pay | Admitting: *Deleted

## 2022-03-17 VITALS — BP 136/85 | HR 71 | Ht 67.0 in | Wt 311.0 lb

## 2022-03-17 DIAGNOSIS — G4733 Obstructive sleep apnea (adult) (pediatric): Secondary | ICD-10-CM | POA: Insufficient documentation

## 2022-03-17 DIAGNOSIS — I10 Essential (primary) hypertension: Secondary | ICD-10-CM

## 2022-03-17 DIAGNOSIS — G4719 Other hypersomnia: Secondary | ICD-10-CM

## 2022-03-17 DIAGNOSIS — I2510 Atherosclerotic heart disease of native coronary artery without angina pectoris: Secondary | ICD-10-CM

## 2022-03-17 DIAGNOSIS — G4734 Idiopathic sleep related nonobstructive alveolar hypoventilation: Secondary | ICD-10-CM

## 2022-03-17 MED ORDER — SALINE SPRAY 0.65 % NA SOLN: 2.0000 | Freq: Every day | NASAL | 0 refills | Status: DC

## 2022-03-17 NOTE — Telephone Encounter (Signed)
Forms left at front desk for pt to sign.

## 2022-03-17 NOTE — Patient Instructions (Addendum)
Medication Instructions:  Please start using nasal saline spray. Use 2 sprays in each nare daily.  *If you need a refill on your cardiac medications before your next appointment, please call your pharmacy*   Lab Work: None. If you have labs (blood work) drawn today and your tests are completely normal, you will receive your results only by: [place] (if you have MyChart) OR A paper copy in the mail If you have any lab test that is abnormal or we need to change your treatment, we will call you to review the results.   Testing/Procedures: None.   Follow-Up: At [facility], you and your health needs are our priority.  As part of our continuing mission to provide you with exceptional heart care, we have created designated Provider Care Teams.  These Care Teams include your primary Cardiologist (physician) and Advanced Practice Providers (APPs -  Physician Assistants and Nurse Practitioners) who all work together to provide you with the care you need, when you need it.  We recommend signing up for the patient portal called "MyChart".  Sign up information is provided on this After Visit Summary.  MyChart is used to connect with patients for Virtual Visits (Telemedicine).  Patients are able to view lab/test results, encounter notes, upcoming appointments, etc.  Non-urgent messages can be sent to your provider as well.   To learn more about what you can do with MyChart, go to [email or web address].    Your next appointment:   1 year(s)  The format for your next appointment:   Virtual Visit   Provider:   Dr. Fransico Him, MD   Other Instructions The sleep team will contact you regarding your under the nose FFM once it is approved by your insurance.   Important Information About Sugar

## 2022-03-17 NOTE — Telephone Encounter (Signed)
Per Dr Radford Pax, Please order an under the nose full face mask.  Order placed to [place] via community message

## 2022-03-17 NOTE — Addendum Note (Signed)
Addended by: Joni Reining on: 03/17/2022 10:58 AM   Modules accepted: Orders

## 2022-03-18 ENCOUNTER — Other Ambulatory Visit: Payer: Self-pay | Admitting: Family Medicine

## 2022-03-18 DIAGNOSIS — E785 Hyperlipidemia, unspecified: Secondary | ICD-10-CM

## 2022-03-18 NOTE — Telephone Encounter (Signed)
Stacks NTBS in Feb for 6 mos FU RF sent to pharmacy

## 2022-03-18 NOTE — Telephone Encounter (Signed)
Pt aware to schedule an apt. She said she would call back.

## 2022-03-20 NOTE — Progress Notes (Unsigned)
Cardiology Office Note   Date:  03/20/2022   ID:  Julia West, DOB 1956-01-14, MRN [id number]  PCP:  Julia Fraise, MD  Cardiologist:   Julia Breeding, MD  No chief complaint on file.    History of Present Illness: Julia West is a 67 y.o. female who presented for evaluation of CAD.   She has had atrial fib.  She wore a monitor and was noted to be persistent atrial fib with some tachybradycardia rates .  She was in the hospital with HF and rapid atrial fib and she had an EF of 30 - 35%.  She failed Tikosyn and she was treated with amio.   I switched her to [facility] at the last visit.  She has not tolerated [place].  ***   ***   ***She had DCCV.   However this failed to maintain sinus rhythm.  She says she really does not feel her heart skipping.  Does not feel particularly short of breath but she is not having any PND or orthopnea.  She does report that she is fatigued.  She wakes up and she feels okay but soon afterwards she gets tired.  She gets around pretty well with her prosthesis.  He had no new chest West, neck or arm discomfort.  She had no weight gain or edema.     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)    left BKA   Depression    Fatty liver    HTN (hypertension)    Hyperlipidemia LDL goal <70    Incarcerated hernia    Internal and external hemorrhoids without complication    Lymphedema of arm    right   Morbid obesity (HCC)    OSA on CPAP    severe obstructive sleep apnea with an AHI of 68.8/h and O2 saturations as low as 69% meeting criteria for nocturnal hypoxemia with O2 saturations less than 88% for 272 minutes.  On CPAP at 8cm H2O   Rheumatoid arthritis (HCC)    Surgical wound, non  healing ABDOMINAL    has wound vac @ 125 mm Hg   Ventral hernia     Past Surgical History:  Procedure Laterality Date   AMPUTATION Left 05/06/2017   Procedure: BELOW KNEE AMPUTATION;  Surgeon: Newt Minion, MD;  Location: [place];  Service: Orthopedics;  Laterality: Left;   APPLICATION OF WOUND VAC     BOWEL RESECTION  02/07/2012   Procedure: SMALL BOWEL RESECTION;  Surgeon: Julia Curry, MD,FACS;  Location: [place];  Service: General;;   CARDIOVASCULAR STRESS TEST  01-17-2012  DR Anush Wiedeman   LOW RISK NUCLEAR TEST   CARDIOVERSION N/A 07/14/2020   Procedure: CARDIOVERSION;  Surgeon: Julia Lenis, MD;  Location: [facility];  Service: Endoscopy;  Laterality: N/A;   CARDIOVERSION N/A 09/22/2021   Procedure: CARDIOVERSION;  Surgeon: Julia Pain, MD;  Location: [facility] ENDOSCOPY;  Service: Cardiovascular;  Laterality: N/A;   CARDIOVERSION N/A 12/30/2021   Procedure: CARDIOVERSION;  Surgeon: Julia Rile, MD;  Location: [place];  Service: Cardiovascular;  Laterality: N/A;   CESAREAN SECTION  x 4 in remote past   CHOLECYSTECTOMY     CORONARY ARTERY BYPASS GRAFT  2003   by Dr. Servando West. LIMA to the LAD, free RIMA to the circumflex. Stress perfusion study December 2009 with no high-risk areas of ischemia. She has a well-preserved ejection fraction   HYSTEROSCOPY WITH D & C N/A 05/14/2014   Procedure: DILATATION AND CURETTAGE (no specimen); HYSTEROSCOPY;  Surgeon: Julia Kind, MD;  Location: [facility];  Service: Gynecology;  Laterality: N/A;   I & D EXTREMITY Left 06/21/2017   Procedure: IRRIGATION AND DEBRIDEMENT OF LEFT LEG AMPUTATION SITE;  Surgeon: Julia Going, DO;  Location: [facility];  Service: Plastics;  Laterality: Left;   INCISION AND DRAINAGE OF WOUND  04/17/2012   Procedure: IRRIGATION AND DEBRIDEMENT WOUND;  Surgeon: Julia Kos, DO;  Location: [place];  Service: Plastics;  Laterality: N/A;  OF ABDOMINAL WOUND, SURGICAL PREP AND PLACEMENT OF VAC,  REMOVAL FOREHEAD SKIN LESION   INCISION AND DRAINAGE OF WOUND N/A 04/24/2012   Procedure: IRRIGATION AND DEBRIDEMENT WOUND;  Surgeon: Julia Kos, DO;  Location: [place];  Service: Plastics;  Laterality: N/A;  I & D ABDOMINAL WOUND WITH VAC AND ACELL   INCISION AND DRAINAGE OF WOUND N/A 05/01/2012   Procedure: IRRIGATION AND DEBRIDEMENT WOUND;  Surgeon: Julia Kos, DO;  Location: [place];  Service: Plastics;  Laterality: N/A;  WITH SURGICAL PREP AND PLACEMENT OF VAC   INCISION AND DRAINAGE OF WOUND N/A 05/08/2012   Procedure: IRRIGATION AND DEBRIDEMENT OF ABD WOUND SURGICAL PREP AND PLACEMENT OF VAC ;  Surgeon: Julia Kos, DO;  Location: [employer];  Service: Plastics;  Laterality: N/A;  IRRIGATION AND DEBRIDEMENT OF ABD WOUND SURGICAL PREP AND PLACEMENT OF VAC    INCISION AND DRAINAGE OF WOUND N/A 05/15/2012   Procedure: IRRIGATION AND DEBRIDEMENT OF ABDOMINAL WOUND WITH POSSIBLE SURGICAL PREP AND PLACEMENT OF VAC;  Surgeon: Julia Kos, DO;  Location: [place];  Service: Plastics;  Laterality: N/A;   INCISION AND DRAINAGE OF WOUND N/A 05/22/2012   Procedure: IRRIGATION AND DEBRIDEMENT OF ABODOMINAL WOUND WITH  SURGICAL PREP AND VAC PLACEMENT;  Surgeon: Julia Kos, DO;  Location: [place];  Service: Plastics;  Laterality: N/A;   INCISION AND DRAINAGE OF WOUND N/A 06/28/2012   Procedure: IRRIGATION AND DEBRIDEMENT OF ABDOMINAL ULCER SURGICAL PREP AND PLACEMENT OF ACELL AND VAC;  Surgeon: Julia Kos, DO;  Location: [place];  Service: Plastics;  Laterality: N/A;   INCISIONAL HERNIA REPAIR  02/07/2012   Procedure: HERNIA REPAIR INCISIONAL;  Surgeon: Julia Curry, MD,FACS;  Location: [place];  Service: General;;  Open, Primary repair, strangulated Incisional hernia.   LESION REMOVAL  04/17/2012   Procedure: LESION REMOVAL;  Surgeon: Julia Kos, DO;  Location: [place];   Service: Plastics;  Laterality: N/A;   CENTER OF FOREHEAD, REMOVAL FORHEAD SKIN LESION   ORIF TOE FRACTURE Left 02/09/2017   Procedure: EXCISION TALAR HEAD, INTERNAL FIXATION MEDIAL COLUMN LEFT FOOT;  Surgeon: Newt Minion, MD;  Location: [place];  Service: Orthopedics;  Laterality: Left;   POLYPECTOMY N/A 05/14/2014   Procedure: ENDOMETRIAL POLYPECTOMY;  Surgeon: Julia Kind, MD;  Location: [facility];  Service: Gynecology;  Laterality: N/A;   STUMP REVISION Left 08/31/2017   Procedure: REVISION LEFT BELOW KNEE AMPUTATION;  Surgeon: Newt Minion, MD;  Location: [place];  Service: Orthopedics;  Laterality: Left;  Current Outpatient Medications  Medication Sig Dispense Refill   acetaminophen (TYLENOL) 325 MG tablet Take 650 mg by mouth 2 (two) times daily as needed (back ache).     apixaban (ELIQUIS) 5 MG TABS tablet Take 1 tablet (5 mg total) by mouth 2 (two) times daily. 28 tablet 0   atorvastatin (LIPITOR) 20 MG tablet TAKE 1 TABLET BY MOUTH ONCE  DAILY 100 tablet 1   carvedilol (COREG) 3.125 MG tablet Take 1 tablet (3.125 mg total) by mouth 2 (two) times daily with a meal. 180 tablet 3   cetirizine (ZYRTEC) 10 MG tablet Take 10 mg by mouth daily as needed for allergies.     fenofibrate 160 MG tablet TAKE 1 TABLET BY MOUTH DAILY 100 tablet 0   fexofenadine (ALLEGRA) 180 MG tablet Take 1 tablet (180 mg total) by mouth daily. For allergy symptoms (Patient taking differently: Take 180 mg by mouth daily as needed for allergies. For allergy symptoms) 90 tablet 3   fluticasone (FLONASE) 50 MCG/ACT nasal spray Place 1 spray into both nostrils daily as needed for allergies or rhinitis.     furosemide (LASIX) 20 MG tablet Take 1 tablet (20 mg total) by mouth daily as needed. 30 tablet 6   nitroGLYCERIN (NITROSTAT) 0.4 MG SL tablet Place 1 tablet (0.4 mg total) under the tongue every 5 (five) minutes x 3 doses as needed for chest West. If West persist after call 911 25 tablet 4   sacubitril-valsartan  (ENTRESTO) 49-51 MG Take 1 tablet by mouth 2 (two) times daily. 60 tablet 2   sertraline (ZOLOFT) 50 MG tablet TAKE 1 TABLET BY MOUTH DAILY 100 tablet 0   sodium chloride (OCEAN) 0.65 % SOLN nasal spray Place 2 sprays into both nostrils daily. 88 mL 0   spironolactone (ALDACTONE) 25 MG tablet Take 1 tablet (25 mg total) by mouth daily. 90 tablet 3   No current facility-administered medications for this visit.    Allergies:   Ace inhibitors    ROS:  Please see the history of present illness.   Otherwise, review of systems are positive for ***.   All other systems are reviewed and negative.    PHYSICAL EXAM: VS:  There were no vitals taken for this visit. , BMI There is no height or weight on file to calculate BMI. GENERAL:  Well appearing NECK:  No jugular venous distention, waveform within normal limits, carotid upstroke brisk and symmetric, no bruits, no thyromegaly LUNGS:  Clear to auscultation bilaterally CHEST:  Unremarkable HEART:  PMI not displaced or sustained,S1 and S2 within normal limits, no S3, no S4, no clicks, no rubs, *** murmurs ABD:  Flat, positive bowel sounds normal in frequency in pitch, no bruits, no rebound, no guarding, no midline pulsatile mass, no hepatomegaly, no splenomegaly EXT:  2 plus pulses throughout, no edema, no cyanosis no clubbing    ***GENERAL:  Well appearing NECK:  No jugular venous distention, waveform within normal limits, carotid upstroke brisk and symmetric, no bruits, no thyromegaly LUNGS:  Clear to auscultation bilaterally CHEST:  Unremarkable HEART:  PMI not displaced or sustained,S1 and S2 within normal limits, no S3,  no clicks, no rubs, no murmurs, irregular  ABD:  Flat, positive bowel sounds normal in frequency in pitch, no bruits, no rebound, no guarding, no midline pulsatile mass, no hepatomegaly, no splenomegaly EXT:  2 plus pulses throughout, no edema, no cyanosis no clubbing   EKG:  EKG is *** ordered today. ***   Recent  Labs: 09/21/2021: TSH 0.951 09/24/2021: Magnesium 2.2 12/02/2021: ALT 15 01/29/2022: B Natriuretic Peptide 144.9; Hemoglobin 15.8; Platelets 284 02/18/2022: BUN 21; Creatinine, Ser 1.23; Potassium 4.8; Sodium 140    Lipid Panel    Component Value Date/Time   CHOL 117 11/04/2021 1029   TRIG 100 11/04/2021 1029   TRIG 247 (H) 09/12/2013 1137   HDL 34 (L) 11/04/2021 1029   HDL 35 (L) 09/12/2013 1137   CHOLHDL 3.4 11/04/2021 1029   LDLCALC 64 11/04/2021 1029   LDLCALC 115 (H) 09/12/2013 1137      Wt Readings from Last 3 Encounters:  03/17/22 (!) 311 lb (141.1 kg)  01/29/22 (!) 310 lb 12.8 oz (141 kg)  01/29/22 (!) 311 lb (141.1 kg)      Other studies Reviewed: Additional studies/ records that were reviewed today include: *** Review of the above records demonstrates: See elsewhere   ASSESSMENT AND PLAN:  CAD:    ***  The patient has no new sypmtoms.  No further cardiovascular testing is indicated.  We will continue with aggressive risk reduction and meds as listed.  ATRIAL FIB:   Ms. SUMI LYE has a CHA2DS2 - VASc score of 4.  She has failed Tikosyn.  She failed amiodarone.  *** She has been turned down for ablation because of her size.  I am West to pursue rate control and anticoagulation.  I will stop the amiodarone and she will watch her heart rate on her Watch.   HTN:  The blood pressure is *** being controlled in the context of managing her cardiomyopathy.   DYSLIPIDEMIA:   LDL was ***61 with an HDL of 73.  No change in therapy.   SLEEP APNEA:  ***      She has been diagnosed with sleep apnea and now has CPAP.  She thinks this is working.  CHRONIC SYSTOLIC HF:   *** This may be rate related.  I am West to continue to titrate meds now.  I am West to switch her to [facility] although cost might be an issue.  She does have follow-up in the heart failure clinic in the continue med titration.  Of note she failed [place] before.  She will stop her valsartan.  Current  medicines are reviewed at length with the patient today.  The patient does not have concerns regarding medicines.  The following changes have been made:   ***   Labs/ tests ordered today include:   ***   No orders of the defined types were placed in this encounter.   Disposition:   FU with me in ***    Signed, Julia Breeding, MD  03/20/2022 9:13 PM    [place]

## 2022-03-24 ENCOUNTER — Ambulatory Visit: Payer: Medicare Other | Admitting: Cardiology

## 2022-03-24 ENCOUNTER — Encounter: Payer: Self-pay | Admitting: Cardiology

## 2022-03-24 VITALS — BP 120/78 | HR 80 | Ht 67.0 in | Wt 314.0 lb

## 2022-03-24 DIAGNOSIS — E785 Hyperlipidemia, unspecified: Secondary | ICD-10-CM

## 2022-03-24 DIAGNOSIS — I2510 Atherosclerotic heart disease of native coronary artery without angina pectoris: Secondary | ICD-10-CM

## 2022-03-24 DIAGNOSIS — I482 Chronic atrial fibrillation, unspecified: Secondary | ICD-10-CM | POA: Diagnosis not present

## 2022-03-24 DIAGNOSIS — I5022 Chronic systolic (congestive) heart failure: Secondary | ICD-10-CM

## 2022-03-24 DIAGNOSIS — I10 Essential (primary) hypertension: Secondary | ICD-10-CM | POA: Diagnosis not present

## 2022-03-24 MED ORDER — CARVEDILOL 6.25 MG PO TABS: 6.2500 mg | ORAL_TABLET | Freq: Two times a day (BID) | ORAL | 3 refills | Status: DC

## 2022-03-24 NOTE — Patient Instructions (Signed)
Medication Instructions:  Please increase your Carvedilol to 6.25 mg twice a day. Continue all other medications as listed.  *If you need a refill on your cardiac medications before your next appointment, please call your pharmacy*  Follow-Up: At [facility], you and your health needs are our priority.  As part of our continuing mission to provide you with exceptional heart care, we have created designated Provider Care Teams.  These Care Teams include your primary Cardiologist (physician) and Advanced Practice Providers (APPs -  Physician Assistants and Nurse Practitioners) who all work together to provide you with the care you need, when you need it.  We recommend signing up for the patient portal called "MyChart".  Sign up information is provided on this After Visit Summary.  MyChart is used to connect with patients for Virtual Visits (Telemedicine).  Patients are able to view lab/test results, encounter notes, upcoming appointments, etc.  Non-urgent messages can be sent to your provider as well.   To learn more about what you can do with MyChart, go to [email or web address].    Your next appointment:   2 month(s)  The format for your next appointment:   In Person  Provider:   [name], MD      Important Information About Sugar

## 2022-04-05 ENCOUNTER — Other Ambulatory Visit (HOSPITAL_COMMUNITY): Payer: Self-pay

## 2022-04-05 MED ORDER — DAPAGLIFLOZIN PROPANEDIOL 10 MG PO TABS: 10.0000 mg | ORAL_TABLET | Freq: Every day | ORAL | 3 refills | Status: DC

## 2022-04-07 ENCOUNTER — Other Ambulatory Visit (HOSPITAL_COMMUNITY): Payer: Self-pay | Admitting: Adult Health

## 2022-04-11 DIAGNOSIS — G4733 Obstructive sleep apnea (adult) (pediatric): Secondary | ICD-10-CM | POA: Diagnosis not present

## 2022-04-22 ENCOUNTER — Encounter (HOSPITAL_COMMUNITY): Payer: Self-pay | Admitting: *Deleted

## 2022-05-12 DIAGNOSIS — G4733 Obstructive sleep apnea (adult) (pediatric): Secondary | ICD-10-CM | POA: Diagnosis not present

## 2022-05-14 ENCOUNTER — Telehealth: Payer: Self-pay | Admitting: Pharmacist

## 2022-05-14 ENCOUNTER — Ambulatory Visit (HOSPITAL_COMMUNITY)
Admission: RE | Admit: 2022-05-14 | Discharge: 2022-05-14 | Disposition: A | Discharge status: Home / Self Care | Payer: Medicare Other | Source: Ambulatory Visit | Attending: Cardiology | Admitting: Cardiology

## 2022-05-14 ENCOUNTER — Encounter (HOSPITAL_COMMUNITY): Payer: Self-pay | Admitting: Cardiology

## 2022-05-14 ENCOUNTER — Other Ambulatory Visit (HOSPITAL_COMMUNITY): Payer: Self-pay | Admitting: Cardiology

## 2022-05-14 ENCOUNTER — Inpatient Hospital Stay (HOSPITAL_COMMUNITY)
Admission: RE | Admit: 2022-05-14 | Discharge: 2022-05-14 | Disposition: A | Discharge status: Home / Self Care | Payer: Medicare Other | Source: Ambulatory Visit | Attending: Cardiology | Admitting: Cardiology

## 2022-05-14 VITALS — BP 110/70 | HR 90 | Wt 314.0 lb

## 2022-05-14 DIAGNOSIS — E669 Obesity, unspecified: Secondary | ICD-10-CM | POA: Insufficient documentation

## 2022-05-14 DIAGNOSIS — I110 Hypertensive heart disease with heart failure: Secondary | ICD-10-CM | POA: Diagnosis not present

## 2022-05-14 DIAGNOSIS — I480 Paroxysmal atrial fibrillation: Secondary | ICD-10-CM | POA: Diagnosis not present

## 2022-05-14 DIAGNOSIS — Z7901 Long term (current) use of anticoagulants: Secondary | ICD-10-CM | POA: Insufficient documentation

## 2022-05-14 DIAGNOSIS — I493 Ventricular premature depolarization: Secondary | ICD-10-CM

## 2022-05-14 DIAGNOSIS — Z951 Presence of aortocoronary bypass graft: Secondary | ICD-10-CM | POA: Insufficient documentation

## 2022-05-14 DIAGNOSIS — R0602 Shortness of breath: Secondary | ICD-10-CM | POA: Insufficient documentation

## 2022-05-14 DIAGNOSIS — E785 Hyperlipidemia, unspecified: Secondary | ICD-10-CM | POA: Diagnosis not present

## 2022-05-14 DIAGNOSIS — Z6841 Body Mass Index (BMI) 40.0 and over, adult: Secondary | ICD-10-CM | POA: Diagnosis not present

## 2022-05-14 DIAGNOSIS — Z87891 Personal history of nicotine dependence: Secondary | ICD-10-CM | POA: Insufficient documentation

## 2022-05-14 DIAGNOSIS — G4733 Obstructive sleep apnea (adult) (pediatric): Secondary | ICD-10-CM | POA: Diagnosis not present

## 2022-05-14 DIAGNOSIS — Z79899 Other long term (current) drug therapy: Secondary | ICD-10-CM | POA: Insufficient documentation

## 2022-05-14 DIAGNOSIS — I2510 Atherosclerotic heart disease of native coronary artery without angina pectoris: Secondary | ICD-10-CM | POA: Insufficient documentation

## 2022-05-14 DIAGNOSIS — I482 Chronic atrial fibrillation, unspecified: Secondary | ICD-10-CM

## 2022-05-14 DIAGNOSIS — I5022 Chronic systolic (congestive) heart failure: Secondary | ICD-10-CM | POA: Insufficient documentation

## 2022-05-14 LAB — BASIC METABOLIC PANEL
Anion gap: 10 (ref 5–15)
BUN: 19 mg/dL (ref 8–23)
CO2: 25 mmol/L (ref 22–32)
Calcium: 8.9 mg/dL (ref 8.9–10.3)
Chloride: 103 mmol/L (ref 98–111)
Creatinine, Ser: 1.14 mg/dL — ABNORMAL HIGH (ref 0.44–1.00)
GFR, Estimated: 52 mL/min — ABNORMAL LOW (ref >60)
Glucose, Bld: 88 mg/dL (ref 70–99)
Potassium: 4.1 mmol/L (ref 3.5–5.1)
Sodium: 138 mmol/L (ref 135–145)

## 2022-05-14 LAB — CBC
HCT: 49.4 % — ABNORMAL HIGH (ref 36.0–46.0)
Hemoglobin: 16 g/dL — ABNORMAL HIGH (ref 12.0–15.0)
MCH: 29.1 pg (ref 26.0–34.0)
MCHC: 32.4 g/dL (ref 30.0–36.0)
MCV: 90 fL (ref 80.0–100.0)
Platelets: 272 10*3/uL (ref 150–400)
RBC: 5.49 MIL/uL — ABNORMAL HIGH (ref 3.87–5.11)
RDW: 15.9 % — ABNORMAL HIGH (ref 11.5–15.5)
WBC: 14.1 10*3/uL — ABNORMAL HIGH (ref 4.0–10.5)
nRBC: 0 % (ref 0.0–0.2)

## 2022-05-14 LAB — LIPID PANEL
Cholesterol: 134 mg/dL (ref 0–200)
HDL: 41 mg/dL (ref >40)
LDL Cholesterol: 76 mg/dL (ref 0–99)
Total CHOL/HDL Ratio: 3.3 RATIO
Triglycerides: 83 mg/dL (ref <150)
VLDL: 17 mg/dL (ref 0–40)

## 2022-05-14 LAB — BRAIN NATRIURETIC PEPTIDE: B Natriuretic Peptide: 199.1 pg/mL — ABNORMAL HIGH (ref 0.0–100.0)

## 2022-05-14 MED ORDER — SPIRONOLACTONE 25 MG PO TABS: 25.0000 mg | ORAL_TABLET | Freq: Every day | ORAL | 3 refills | Status: DC

## 2022-05-14 NOTE — Telephone Encounter (Signed)
-----   Message from Jerl Mina, RN sent at 05/14/2022  9:48 AM EST ----- Semaglutide or Delene Ruffini, which ever she qualifies for

## 2022-05-14 NOTE — Progress Notes (Signed)
ReDS Vest / Clip - 05/14/22 1000       ReDS Vest / Clip   Station Marker D    Ruler Value 34    ReDS Value Range Moderate volume overload    ReDS Actual Value 37

## 2022-05-14 NOTE — Telephone Encounter (Signed)
Pt has Medicare Part D insurance which does not cover weight loss medications.  Prior A1cs in prediabetic range, A1c rechecked on 3/1 and remains in prediabetic range at 5.8%. Unfortunately Medicare insurance will not cover GLP with prediabetic A1c.  Called pt to discuss, she states her PCP prescribed her Darcel Bayley last year but she wasn't able to afford it due to donut hole pricing. Then states her [facility] pharmacy called her recently for meds to be refilled and that Darcel Bayley was free? Not clear how this is the case. Must be on formulary without PA. Discussed it would be off label use so if her insurance requires PA in the future, she would no longer have coverage for the med. Medicare pricing for GLPs is also ~$45/month, not sure why they quoted her $0. She will call them back to confirm and let me know if she's able to get rx. If she is, discussed I do not know how long it would be free for since it sounds unusual, plus she's on multiple other branded meds which put pts in the donut hole sooner. She states she's getting pt assistance for her Delene Loll and Wilder Glade but not her Eliquis.

## 2022-05-14 NOTE — Patient Instructions (Signed)
INCREASE Spironolactone to 25 mg daily.  Labs done today, your results will be available in MyChart, we will contact you for abnormal readings.  Repeat blood work in 10 days.  Your provider has recommended that  you wear a Zio Patch for 7 days.  This monitor will record your heart rhythm for our review.  IF you have any symptoms while wearing the monitor please press the button.  If you have any issues with the patch or you notice a red or orange light on it please call the company at [phone].  Once you remove the patch please mail it back to the company as soon as possible so we can get the results.  You have been referred to the [facility]. They will call you to arrange your appointment.  Please follow up with our heart failure pharmacist in 5 weeks  Your physician recommends that you schedule a follow-up appointment in: 3 months.  If you have any questions or concerns before your next appointment please send [place] a message through [place] or call our office at [phone].    TO LEAVE A MESSAGE FOR THE NURSE SELECT OPTION 2, PLEASE LEAVE A MESSAGE INCLUDING: YOUR NAME DATE OF BIRTH CALL BACK NUMBER REASON FOR CALL**this is important as we prioritize the call backs  YOU WILL RECEIVE A CALL BACK THE SAME DAY AS LONG AS YOU CALL BEFORE 4:00 PM  At the [facility], you and your health needs are our priority. As part of our continuing mission to provide you with exceptional heart care, we have created designated Provider Care Teams. These Care Teams include your primary Cardiologist (physician) and Advanced Practice Providers (APPs- Physician Assistants and Nurse Practitioners) who all work together to provide you with the care you need, when you need it.   You may see any of the following providers on your designated Care Team at your next follow up: Dr Glori Bickers Dr Loralie Champagne Dr. Roxana Hires, NP Lyda Jester, [place] [facility], [place] Forestine Na, NP Audry Riles, PharmD   Please be sure to bring in all your medications bottles to every appointment.    Thank you for choosing [facility]

## 2022-05-15 LAB — HEMOGLOBIN A1C
Hgb A1c MFr Bld: 5.8 % — ABNORMAL HIGH (ref 4.8–5.6)
Mean Plasma Glucose: 120 mg/dL

## 2022-05-16 NOTE — Progress Notes (Signed)
Primary Care: [name], MD HF Cardiology: Dr Aundra Dubin  HPI: Referred to clinic initially by Dr Cathlean Sauer for heart failure consultation.   Julia West is a 67 y.o. with a history of PAF,  HTN, CAD, CABG  2003, hyperlipidemia, LBKA, lymphedema RLE/RUE, and chronic HFrEF.   Admitted in 7/23 with atrial fibrillation/RVR and acute systolic CHF. Diuresed with IV lasix. Echo showed newly reduced EF 30-35%. Had DC-CV 09/22/21 with restoration of SR. Plan to f/u with Dr Myrtie Neither for ablation. Started on SGLT2i and MRA. Discharged on BB and ARB. Discharge weight 305 pounds.   Zio in 8/23 showed 19.2% PVCs.  She was transitioned from Tikosyn to amiodarone.  Atrial fibrillation was persistent, she failed DCCV in 10/23.  She saw Dr. Curt Bears, decided against ablation due to obesity.  Amiodarone was stopped. She has been in rate-controlled AF since that time.    She returns for followup of CHF. She is using CPAP every night.  She has a prosthetic left lower leg.  She uses crutches for balance when she walks.  She has occasional chest "pinching" lasting for several seconds, nothing prolonged or exertional. She uses CPAP, no orthopnea/PND.  Tolerating meds without lightheadedness.  Weight up 4 lbs.  She has ongoing exertional dyspnea walking 100-200 feet, this has not changed.  She is no longer smoking. No BRBPR/melena.  She takes Lasix once or twice a week.   Labs (8/23): LDL 64 Labs (10/23): K 5, creatinine 1.17 Labs (12/23): K 4.8, creatinine 1.23  ECG (personally reviewed): atrial fibrillation, LAFB, septal Qs, PACs  REDs clip 37%  PMH: 1. CAD: CABG x 2 in 2003 with LIMA-LAD, free RIMA-LCX 2. Rheumatoid arthritis 3. Left Charcot foot with left BKA.  - Peripheral arterial dopplers (12/23) with mild right leg PAD.  4. HTN 5. Prior smoker 6. Atrial fibrillation: Chronic.  Has failed Tikosyn, amiodarone, and DCCV.  7. PVCs: Zio (8/23) with 19.2% PVCs.  8. OSA: CPAP.  9. Chronic systolic CHF:  ?Ischemic cardiomyopathy versus tachycardia-mediated.   - Echo 4/22: EF 50-55% - Echo 7/23: EF 30-35% RV normal  - Echo 11/23: EF 40-45%, mild LV dilation, moderate RV enlargement, moderately decreased RV systolic function, mild MR, IVC normal.  10. Hyperlipidemia   Review of Systems:  All systems reviewed and negative except as per HPI.     Current Outpatient Medications  Medication Sig Dispense Refill   acetaminophen (TYLENOL) 325 MG tablet Take 650 mg by mouth 2 (two) times daily as needed (back ache).     apixaban (ELIQUIS) 5 MG TABS tablet Take 1 tablet (5 mg total) by mouth 2 (two) times daily. 28 tablet 0   atorvastatin (LIPITOR) 20 MG tablet TAKE 1 TABLET BY MOUTH ONCE  DAILY 100 tablet 1   carvedilol (COREG) 6.25 MG tablet Take 1 tablet (6.25 mg total) by mouth 2 (two) times daily. 180 tablet 3   cetirizine (ZYRTEC) 10 MG tablet Take 10 mg by mouth daily as needed for allergies.     dapagliflozin propanediol (FARXIGA) 10 MG TABS tablet Take 1 tablet (10 mg total) by mouth daily before breakfast. 90 tablet 3   fenofibrate 160 MG tablet TAKE 1 TABLET BY MOUTH DAILY 100 tablet 0   fexofenadine (ALLEGRA) 180 MG tablet Take 1 tablet (180 mg total) by mouth daily. For allergy symptoms (Patient taking differently: Take 180 mg by mouth daily as needed for allergies. For allergy symptoms) 90 tablet 3   fluticasone (FLONASE) 50 MCG/ACT nasal spray  Place 1 spray into both nostrils daily as needed for allergies or rhinitis.     furosemide (LASIX) 20 MG tablet Take 1 tablet (20 mg total) by mouth daily as needed. 30 tablet 6   nitroGLYCERIN (NITROSTAT) 0.4 MG SL tablet Place 1 tablet (0.4 mg total) under the tongue every 5 (five) minutes x 3 doses as needed for chest pain. If pain persist after call 911 25 tablet 4   sacubitril-valsartan (ENTRESTO) 49-51 MG Take 1 tablet by mouth 2 (two) times daily. 60 tablet 2   sertraline (ZOLOFT) 50 MG tablet TAKE 1 TABLET BY MOUTH DAILY 100 tablet 0    sodium chloride (OCEAN) 0.65 % SOLN nasal spray Place 2 sprays into both nostrils daily. (Patient taking differently: Place 2 sprays into both nostrils daily. As needed) 88 mL 0   spironolactone (ALDACTONE) 25 MG tablet Take 1 tablet (25 mg total) by mouth daily. 90 tablet 3   No current facility-administered medications for this encounter.    Allergies  Allergen Reactions   Ace Inhibitors Cough      Social History   Socioeconomic History   Marital status: Married    Spouse name: Eddie Dibbles   Number of children: 5   Years of education: Not on file   Highest education level: GED or equivalent  Occupational History   Occupation: Estate manager/land agent    Comment: In the [employer]   Occupation: retired  Tobacco Use   Smoking status: Former    Packs/day: 1.00    Years: 25.00    Total pack years: 25.00    Types: Cigarettes    Quit date: 11/17/2011    Years since quitting: 10.5    Passive exposure: Current   Smokeless tobacco: Never  Vaping Use   Vaping Use: Never used  Substance and Sexual Activity   Alcohol use: No   Drug use: No   Sexual activity: Yes    Birth control/protection: Post-menopausal  Other Topics Concern   Not on file  Social History Narrative   Lives in [place] with her husband. 5 children. One of whom has pulmonary stenosis.    Left BKA, uses crutches - doesn't get much exercise   Social Determinants of Health   Financial Resource Strain: Low Risk  (09/22/2021)   Overall Financial Resource Strain (CARDIA)    Difficulty of Paying Living Expenses: Not very hard  Food Insecurity: No Food Insecurity (09/22/2021)   Hunger Vital Sign    Worried About Running Out of Food in the Last Year: Never true    Ran Out of Food in the Last Year: Never true  Transportation Needs: No Transportation Needs (09/22/2021)   PRAPARE - Hydrologist (Medical): No    Lack of Transportation (Non-Medical): No  Physical Activity: Insufficiently Active (10/01/2020)    Exercise Vital Sign    Days of Exercise per Week: 7 days    Minutes of Exercise per Session: 10 min  Stress: No Stress Concern Present (10/01/2020)   [place]    Feeling of Stress : Not at all  Social Connections: Moderately Isolated (10/01/2020)   Social Connection and Isolation Panel [NHANES]    Frequency of Communication with Friends and Family: More than three times a week    Frequency of Social Gatherings with Friends and Family: Twice a week    Attends Religious Services: Never    Marine scientist or Organizations: No    Attends  Club or Organization Meetings: Never    Marital Status: Married  Human resources officer Violence: Not At Risk (10/01/2020)   Humiliation, Afraid, Rape, and Kick questionnaire    Fear of Current or Ex-Partner: No    Emotionally Abused: No    Physically Abused: No    Sexually Abused: No      Family History  Problem Relation Age of Onset   Heart attack Mother    Hypertension Mother    Heart disease Mother    Sudden death Mother    Obesity Mother    Hypertension Sister    Diabetes Brother    Birth defects Son        heart defect    Vitals:   05/14/22 0915  BP: 110/70  Pulse: 90  SpO2: 92%  Weight: (!) 142.4 kg (314 lb)   Wt Readings from Last 3 Encounters:  05/14/22 (!) 142.4 kg (314 lb)  03/24/22 (!) 142.4 kg (314 lb)  03/17/22 (!) 141.1 kg (311 lb)   PHYSICAL EXAM: General: NAD Neck: JVP 8 cm, no thyromegaly or thyroid nodule.  Lungs: Clear to auscultation bilaterally with normal respiratory effort. CV: Nondisplaced PMI.  Heart irregular S1/S2, no S3/S4, no murmur.  No peripheral edema.  No carotid bruit.  Unable to palpate pedal pulses.  Abdomen: Soft, nontender, no hepatosplenomegaly, no distention.  Skin: Intact without lesions or rashes.  Neurologic: Alert and oriented x 3.  Psych: Normal affect. Extremities: No clubbing or cyanosis. Left BKA.  HEENT: Normal.    ASSESSMENT & PLAN: 1. Chronic systolic CHF:  Echo [id number] down to 30-35% from previous 50-55% back 2022.  Patient was in atrial fibrillation with RVR, ?tachy-mediated cardiomyopathy.  Echo in 11/23 with controlled rate (still in atrial fibrillation) showed EF still low at 40-45%, mild LV dilation, moderate RV dilation and moderately decreased RV systolic function.  Also possible that PVCs play a role as well as CAD (history of CABG).  NYHA class III symptoms, stable.  Weight is up 4 lbs.  Mild volume overload by exam and REDS clip.    - I would like her to start Lasix 20 mg every other day.  BMET/BNP today and BMET in 10 days.   - Farxiga 10 mg daily.  - Continue carvedilol 6.25 mg twice a day  - Continue Entresto 49/51 bid.   - Increase spironolactone to 25 mg daily.  - Her insurance refused RHC/LHC after last appointment (fall in EF, known CAD).  Will hold off given stable symptoms currently and treat medically for now.  - Zio monitor x 7 days to quantify PVCs (?contributing to cardiomyopathy).  If significant percentage, would restart amiodarone to suppress.  2. Atrial fibrillation: Now chronic.  She has failed amiodarone and Tikosyn as well as DCCV.  She saw EP and was deemed to be not a candidate for ablation due to obesity.  - Would reassess ablation vs cardioversion if she could lose some weight.  - Continue Eliquis.  - good rate control on Coreg.  3. Obesity: Body mass index is 49.18 kg/m. - Will check HgbA1c today and refer to pharmacy clinic for semaglutide vs tirzepatide.  4. PVCs: 19.2% PVCs on 8/23 Zio.  She is now off amiodarone. Question if PVCs play a role in cardiomyopathy.  - Repeat Zio monitor x 7 days to reassess PVCs, ?restart amiodarone.  5. OSA: Continue CPAP.  6. CAD: CABG 2003.  No chest pain but has significant exertional dyspnea.  - Not on ASA given apixaban  use.  - Continue atorvastatin.  Good lipids in 8/23.  7. 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  Mild  right leg PAD on peripheral arterial dopplers in 12/23.   Pharmacy clinic 1 month for med titration, followup 3 months with APP.   Loralie Champagne  05/16/2022

## 2022-05-21 NOTE — Telephone Encounter (Signed)
Called pt to follow up about Mounjaro, no answer, left message.

## 2022-05-26 ENCOUNTER — Ambulatory Visit: Payer: Medicare Other | Admitting: Cardiology

## 2022-05-26 NOTE — Telephone Encounter (Signed)
Called pt, she states she was advised that pharmacy cannot process Mounjaro rx without prior authorization which requires DM dx. Since she does not have DM, will not be able to start on GLP unfortunately.  She asks about new CV indication for [facility]. Discussed that hopefully insurance companies in the future will update their coverage criteria to include this indication, but that plans are not yet doing this since the indication is so new (likely would be next year at the earliest if even then). Advised she can ask [place] to rerun benefit coverage next year to see if her plan has updated coverage.

## 2022-05-27 ENCOUNTER — Other Ambulatory Visit: Payer: Medicare Other

## 2022-05-27 ENCOUNTER — Other Ambulatory Visit (HOSPITAL_COMMUNITY): Payer: Self-pay | Admitting: Cardiology

## 2022-05-27 DIAGNOSIS — I5022 Chronic systolic (congestive) heart failure: Secondary | ICD-10-CM | POA: Diagnosis not present

## 2022-05-28 LAB — BASIC METABOLIC PANEL
BUN/Creatinine Ratio: 18 (ref 12–28)
BUN: 21 mg/dL (ref 8–27)
CO2: 21 mmol/L (ref 20–29)
Calcium: 9.2 mg/dL (ref 8.7–10.3)
Chloride: 104 mmol/L (ref 96–106)
Creatinine, Ser: 1.18 mg/dL — ABNORMAL HIGH (ref 0.57–1.00)
Glucose: 81 mg/dL (ref 70–99)
Potassium: 5 mmol/L (ref 3.5–5.2)
Sodium: 141 mmol/L (ref 134–144)
eGFR: 50 mL/min/{1.73_m2} — ABNORMAL LOW (ref >59)

## 2022-05-31 ENCOUNTER — Encounter (HOSPITAL_COMMUNITY): Payer: Self-pay

## 2022-05-31 ENCOUNTER — Telehealth: Payer: Self-pay | Admitting: Family Medicine

## 2022-05-31 DIAGNOSIS — I493 Ventricular premature depolarization: Secondary | ICD-10-CM | POA: Diagnosis not present

## 2022-05-31 NOTE — Telephone Encounter (Signed)
Contacted [name] to schedule their annual wellness visit. Appointment made for 06/10/2022.  Thank you,  Colletta Maryland,  [facility] Direct Dial ??[phone]

## 2022-06-04 ENCOUNTER — Telehealth: Payer: Self-pay | Admitting: Pharmacist

## 2022-06-04 NOTE — Telephone Encounter (Signed)
Pt called clinic, saw new FDA indication for CV risk reduction for [facility] and that Medicare is supposed to start covering. Discussed it will likely take some time before insurance plans update their formulary with the new indication, but will try submitting prior auth and see what happens. Key: [id number]

## 2022-06-07 NOTE — Telephone Encounter (Addendum)
PA request denied because info was not provided stating that pt is not using Wegovy in combination with reduced calorie diet and increased physical activity. This question was never asked on her prior authorization form. Unclear why they expect [place] to provide information that they require when they don't ask the question in their coverage criteria in the first place. Appeal has been faxed to [phone].  Left message for pt that appeal has been faxed.

## 2022-06-08 ENCOUNTER — Other Ambulatory Visit: Payer: Self-pay | Admitting: Hematology and Oncology

## 2022-06-08 DIAGNOSIS — D72820 Lymphocytosis (symptomatic): Secondary | ICD-10-CM

## 2022-06-08 NOTE — Progress Notes (Unsigned)
[place] [phone]   [phone]  PROGRESS NOTE  Patient Care Team: [name], MD as PCP - General (Family Medicine) [name], MD as PCP - Cardiology (Cardiology) Constance Haw, MD as PCP - Electrophysiology (Cardiology) Lavera Guise, [facility] as Pharmacist (Family Medicine)  Hematological/Oncological History # Leukocytosis-Mixed Lymphocytosis/Neutrophilia 11/04/21: WBC 14.6, Hgb 14.8, MCV 85, Plt 290 12/02/2021: establish care with Dr. Lorenso Courier     Interval History:  Julia West 67 y.o. female with medical history significant for leukocytosis who presents for a follow up visit. The patient's last visit was on 12/02/2021 at which time she established care. In the interim since the last visit ***  On exam today Mrs. 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    S/p CABG x 2 in 2003   Cancer [facility])    basal cell face   Complication of anesthesia    states low O2 sats post-op 11/13, always slow to awaken   Degenerative joint disease    Dehiscence of amputation stump ([place])    left BKA   Depression    Fatty liver    HTN (hypertension)    Hyperlipidemia LDL goal <70    Incarcerated hernia    Internal and external hemorrhoids without complication    Lymphedema of arm    right   Morbid obesity (HCC)    OSA on CPAP    severe obstructive sleep apnea with an AHI of 68.8/h and O2 saturations as low as 69% meeting criteria for nocturnal hypoxemia with O2 saturations less than 88% for 272 minutes.  On CPAP at 8cm H2O   Rheumatoid arthritis ([place])    Surgical wound, non healing ABDOMINAL    has wound vac @ 125 mm Hg   Ventral hernia     SURGICAL HISTORY: Past Surgical History:  Procedure Laterality Date   AMPUTATION Left 05/06/2017   Procedure: BELOW KNEE AMPUTATION;  Surgeon: Newt Minion, MD;  Location: [place];  Service: Orthopedics;  Laterality:  Left;   APPLICATION OF WOUND VAC     BOWEL RESECTION  02/07/2012   Procedure: SMALL BOWEL RESECTION;  Surgeon: Gayland Curry, MD,FACS;  Location: [place];  Service: General;;   CARDIOVASCULAR STRESS TEST  01-17-2012  DR HOCHREIN   LOW RISK NUCLEAR TEST   CARDIOVERSION N/A 07/14/2020   Procedure: CARDIOVERSION;  Surgeon: Arnoldo Lenis, MD;  [facility]: [facility];  Service: Endoscopy;  Laterality: N/A;   CARDIOVERSION N/A 09/22/2021   Procedure: CARDIOVERSION;  Surgeon: Jerline Pain, MD;  Location: [facility] ENDOSCOPY;  Service: Cardiovascular;  Laterality: N/A;   CARDIOVERSION N/A 12/30/2021   Procedure: CARDIOVERSION;  Surgeon: Geralynn Rile, MD;  Location: [place];  Service: Cardiovascular;  Laterality: N/A;   CESAREAN SECTION     x 4 in remote past   [place] GRAFT  2003   by Dr. Servando Snare. LIMA to the LAD, free RIMA to the circumflex. Stress perfusion study December 2009 with no high-risk areas of ischemia. She has a well-preserved ejection fraction   HYSTEROSCOPY WITH D & C N/A 05/14/2014   Procedure: DILATATION AND CURETTAGE (no specimen); HYSTEROSCOPY;  Surgeon: Jonnie Kind, MD;  Location: [facility];  Service: Gynecology;  Laterality: N/A;   I & D EXTREMITY Left 06/21/2017   Procedure: IRRIGATION AND DEBRIDEMENT OF LEFT LEG AMPUTATION SITE;  Surgeon: Wallace Going, DO;  Location: [facility];  Service: Plastics;  Laterality: Left;   INCISION AND DRAINAGE OF WOUND  04/17/2012   Procedure: IRRIGATION AND DEBRIDEMENT WOUND;  Surgeon: Theodoro Kos, DO;  Location: [place];  Service: Plastics;  Laterality: N/A;  OF ABDOMINAL WOUND, SURGICAL PREP AND PLACEMENT OF VAC, REMOVAL FOREHEAD SKIN LESION   INCISION AND DRAINAGE OF WOUND N/A 04/24/2012   Procedure: IRRIGATION AND DEBRIDEMENT WOUND;  Surgeon: Theodoro Kos, DO;  Location: [place];  Service: Plastics;  Laterality: N/A;  I & D ABDOMINAL WOUND WITH VAC AND ACELL    INCISION AND DRAINAGE OF WOUND N/A 05/01/2012   Procedure: IRRIGATION AND DEBRIDEMENT WOUND;  Surgeon: Theodoro Kos, DO;  Location: [place];  Service: Plastics;  Laterality: N/A;  WITH SURGICAL PREP AND PLACEMENT OF VAC   INCISION AND DRAINAGE OF WOUND N/A 05/08/2012   Procedure: IRRIGATION AND DEBRIDEMENT OF ABD WOUND SURGICAL PREP AND PLACEMENT OF VAC ;  Surgeon: Theodoro Kos, DO;  Location: [place];  Service: Plastics;  Laterality: N/A;  IRRIGATION AND DEBRIDEMENT OF ABD WOUND SURGICAL PREP AND PLACEMENT OF VAC    INCISION AND DRAINAGE OF WOUND N/A 05/15/2012   Procedure: IRRIGATION AND DEBRIDEMENT OF ABDOMINAL WOUND WITH POSSIBLE SURGICAL PREP AND PLACEMENT OF VAC;  Surgeon: Theodoro Kos, DO;  Location: [place];  Service: Plastics;  Laterality: N/A;   INCISION AND DRAINAGE OF WOUND N/A 05/22/2012   Procedure: IRRIGATION AND DEBRIDEMENT OF ABODOMINAL WOUND WITH  SURGICAL PREP AND VAC PLACEMENT;  Surgeon: Theodoro Kos, DO;  Location: [place];  Service: Plastics;  Laterality: N/A;   INCISION AND DRAINAGE OF WOUND N/A 06/28/2012   Procedure: IRRIGATION AND DEBRIDEMENT OF ABDOMINAL ULCER SURGICAL PREP AND PLACEMENT OF ACELL AND VAC;  Surgeon: Theodoro Kos, DO;  Location: [place];  Service: Plastics;  Laterality: N/A;   INCISIONAL HERNIA REPAIR  02/07/2012   Procedure: HERNIA REPAIR INCISIONAL;  Surgeon: Gayland Curry, MD,FACS;  Location: [place];  Service: General;;  Open, Primary repair, strangulated Incisional hernia.   LESION REMOVAL  04/17/2012   Procedure: LESION REMOVAL;  Surgeon: Theodoro Kos, DO;  Location: [place];  Service: Plastics;  Laterality: N/A;   CENTER OF FOREHEAD, REMOVAL FORHEAD SKIN LESION   ORIF TOE FRACTURE Left 02/09/2017   Procedure: EXCISION TALAR HEAD, INTERNAL FIXATION MEDIAL COLUMN LEFT FOOT;  Surgeon: Newt Minion, MD;  Location: [place];  Service: Orthopedics;   Laterality: Left;   POLYPECTOMY N/A 05/14/2014   Procedure: ENDOMETRIAL POLYPECTOMY;  Surgeon: Jonnie Kind, MD;  Location: [facility];  Service: Gynecology;  Laterality: N/A;   STUMP REVISION Left 08/31/2017   Procedure: REVISION LEFT BELOW KNEE AMPUTATION;  Surgeon: Newt Minion, MD;  Location: [place];  Service: Orthopedics;  Laterality: Left;    SOCIAL HISTORY: Social History   Socioeconomic History   Marital status: Married    Spouse name: Eddie Dibbles   Number of children: 5   Years of education: Not on file   Highest education level: GED or equivalent  Occupational History   Occupation: Estate manager/land agent    Comment: In the [employer]   Occupation: retired  Tobacco Use   Smoking status: Former    Packs/day: 1.00    Years: 25.00    Additional pack years: 0.00    Total pack years: 25.00    Types: Cigarettes    Quit date: 11/17/2011  Years since quitting: 10.5    Passive exposure: Current   Smokeless tobacco: Never  Vaping Use   Vaping Use: Never used  Substance and Sexual Activity   Alcohol use: No   Drug use: No   Sexual activity: Yes    Birth control/protection: Post-menopausal  Other Topics Concern   Not on file  Social History Narrative   Lives in [place] with her husband. 5 children. One of whom has pulmonary stenosis.    Left BKA, uses crutches - doesn't get much exercise   Social Determinants of Health   Financial Resource Strain: Low Risk  (09/22/2021)   Overall Financial Resource Strain (CARDIA)    Difficulty of Paying Living Expenses: Not very hard  Food Insecurity: No Food Insecurity (09/22/2021)   Hunger Vital Sign    Worried About Running Out of Food in the Last Year: Never true    Ran Out of Food in the Last Year: Never true  Transportation Needs: No Transportation Needs (09/22/2021)   PRAPARE - Hydrologist (Medical): No    Lack of Transportation (Non-Medical): No  Physical Activity: Insufficiently Active (10/01/2020)   Exercise  Vital Sign    Days of Exercise per Week: 7 days    Minutes of Exercise per Session: 10 min  Stress: No Stress Concern Present (10/01/2020)   [place]    Feeling of Stress : Not at all  Social Connections: Moderately Isolated (10/01/2020)   Social Connection and Isolation Panel [NHANES]    Frequency of Communication with Friends and Family: More than three times a week    Frequency of Social Gatherings with Friends and Family: Twice a week    Attends Religious Services: Never    Marine scientist or Organizations: No    Attends Archivist Meetings: Never    Marital Status: Married  Human resources officer Violence: Not At Risk (10/01/2020)   Humiliation, Afraid, Rape, and Kick questionnaire    Fear of Current or Ex-Partner: No    Emotionally Abused: No    Physically Abused: No    Sexually Abused: No    FAMILY HISTORY: Family History  Problem Relation Age of Onset   Heart attack Mother    Hypertension Mother    Heart disease Mother    Sudden death Mother    Obesity Mother    Hypertension Sister    Diabetes Brother    Birth defects Son        heart defect    ALLERGIES:  is allergic to ace inhibitors.  MEDICATIONS:  Current Outpatient Medications  Medication Sig Dispense Refill   acetaminophen (TYLENOL) 325 MG tablet Take 650 mg by mouth 2 (two) times daily as needed (back ache).     apixaban (ELIQUIS) 5 MG TABS tablet Take 1 tablet (5 mg total) by mouth 2 (two) times daily. 28 tablet 0   atorvastatin (LIPITOR) 20 MG tablet TAKE 1 TABLET BY MOUTH ONCE  DAILY 100 tablet 1   carvedilol (COREG) 6.25 MG tablet Take 1 tablet (6.25 mg total) by mouth 2 (two) times daily. 180 tablet 3   cetirizine (ZYRTEC) 10 MG tablet Take 10 mg by mouth daily as needed for allergies.     dapagliflozin propanediol (FARXIGA) 10 MG TABS tablet Take 1 tablet (10 mg total) by mouth daily before breakfast. 90 tablet 3    fenofibrate 160 MG tablet TAKE 1 TABLET BY MOUTH DAILY 100 tablet 0  fexofenadine (ALLEGRA) 180 MG tablet Take 1 tablet (180 mg total) by mouth daily. For allergy symptoms (Patient taking differently: Take 180 mg by mouth daily as needed for allergies. For allergy symptoms) 90 tablet 3   fluticasone (FLONASE) 50 MCG/ACT nasal spray Place 1 spray into both nostrils daily as needed for allergies or rhinitis.     furosemide (LASIX) 20 MG tablet Take 1 tablet (20 mg total) by mouth daily as needed. 30 tablet 6   nitroGLYCERIN (NITROSTAT) 0.4 MG SL tablet Place 1 tablet (0.4 mg total) under the tongue every 5 (five) minutes x 3 doses as needed for chest pain. If pain persist after call 911 25 tablet 4   sacubitril-valsartan (ENTRESTO) 49-51 MG Take 1 tablet by mouth 2 (two) times daily. 60 tablet 2   sertraline (ZOLOFT) 50 MG tablet TAKE 1 TABLET BY MOUTH DAILY 100 tablet 0   sodium chloride (OCEAN) 0.65 % SOLN nasal spray Place 2 sprays into both nostrils daily. (Patient taking differently: Place 2 sprays into both nostrils daily. As needed) 88 mL 0   spironolactone (ALDACTONE) 25 MG tablet Take 1 tablet (25 mg total) by mouth daily. 90 tablet 3   No current facility-administered medications for this visit.    REVIEW OF SYSTEMS:   Constitutional: ( - ) fevers, ( - )  chills , ( - ) night sweats Eyes: ( - ) blurriness of vision, ( - ) double vision, ( - ) watery eyes Ears, nose, mouth, throat, and face: ( - ) mucositis, ( - ) sore throat Respiratory: ( - ) cough, ( - ) dyspnea, ( - ) wheezes Cardiovascular: ( - ) palpitation, ( - ) chest discomfort, ( - ) lower extremity swelling Gastrointestinal:  ( - ) nausea, ( - ) heartburn, ( - ) change in bowel habits Skin: ( - ) abnormal skin rashes Lymphatics: ( - ) new lymphadenopathy, ( - ) easy bruising Neurological: ( - ) numbness, ( - ) tingling, ( - ) new weaknesses Behavioral/Psych: ( - ) mood change, ( - ) new changes  All other systems were  reviewed with the patient and are negative.  PHYSICAL EXAMINATION: ECOG PERFORMANCE STATUS: {CHL ONC ECOG PS:[id number]}  There were no vitals filed for this visit. There were no vitals filed for this visit.  GENERAL: alert, no distress and comfortable SKIN: skin color, texture, turgor are normal, no rashes or significant lesions EYES: conjunctiva are pink and non-injected, sclera clear OROPHARYNX: no exudate, no erythema; lips, buccal mucosa, and tongue normal  NECK: supple, non-tender LYMPH:  no palpable lymphadenopathy in the cervical, axillary or inguinal LUNGS: clear to auscultation and percussion with normal breathing effort HEART: regular rate & rhythm and no murmurs and no lower extremity edema ABDOMEN: soft, non-tender, non-distended, normal bowel sounds Musculoskeletal: no cyanosis of digits and no clubbing  PSYCH: alert & oriented x 3, fluent speech NEURO: no focal motor/sensory deficits  LABORATORY DATA:  I have reviewed the data as listed    Latest Ref Rng & Units 05/14/2022    9:53 AM 01/29/2022   10:09 AM 12/30/2021    9:35 AM  CBC  WBC 4.0 - 10.5 K/uL 14.1  15.5  10.7   Hemoglobin 12.0 - 15.0 g/dL 16.0  15.8  15.2   Hematocrit 36.0 - 46.0 % 49.4  50.9  48.2   Platelets 150 - 400 K/uL 272  284  282        Latest Ref Rng & Units 05/27/2022  9:43 AM 05/14/2022    9:53 AM 02/18/2022    2:36 PM  CMP  Glucose 70 - 99 mg/dL 81  88  110   BUN 8 - 27 mg/dL 21  19  21    Creatinine 0.57 - 1.00 mg/dL 1.18  1.14  1.23   Sodium 134 - 144 mmol/L 141  138  140   Potassium 3.5 - 5.2 mmol/L 5.0  4.1  4.8   Chloride 96 - 106 mmol/L 104  103  103   CO2 20 - 29 mmol/L 21  25  22    Calcium 8.7 - 10.3 mg/dL 9.2  8.9  9.3     No results found for: "MPROTEIN" No results found for: "KPAFRELGTCHN", "LAMBDASER", "KAPLAMBRATIO"   BLOOD FILM: *** Review of the peripheral blood smear showed normal appearing white cells with neutrophils that were appropriately lobated and  granulated. There was no predominance of bi-lobed or hyper-segmented neutrophils appreciated. No Dohle bodies were noted. There was no left shifting, immature forms or blasts noted. Lymphocytes remain normal in size without any predominance of large granular lymphocytes. Red cells show no anisopoikilocytosis, macrocytes , microcytes or polychromasia. There were no schistocytes, target cells, echinocytes, acanthocytes, dacrocytes, or stomatocytes.There was no rouleaux formation, nucleated red cells, or intra-cellular inclusions noted. The platelets are normal in size, shape, and color without any clumping evident.  RADIOGRAPHIC STUDIES: I have personally reviewed the radiological images as listed and agreed with the findings in the report. No results found.  ASSESSMENT & PLAN ***  No orders of the defined types were placed in this encounter.   All questions were answered. The patient knows to call the clinic with any problems, questions or concerns.  A total of more than 30 minutes were spent on this encounter with face-to-face time and non-face-to-face time, including preparing to see the patient, ordering tests and/or medications, counseling the patient and coordination of care as outlined above.   Ledell Peoples, MD Department of Hematology/Oncology [place] at [facility] Phone: [phone] Pager: [phone] Email: [email or web address]  06/08/2022 2:04 PM

## 2022-06-08 NOTE — Addendum Note (Signed)
Encounter addended by: Micki Riley, RN on: 06/08/2022 11:30 AM  Actions taken: Imaging Exam ended

## 2022-06-09 ENCOUNTER — Inpatient Hospital Stay: Payer: Medicare Other | Attending: Hematology and Oncology | Admitting: Hematology and Oncology

## 2022-06-09 ENCOUNTER — Inpatient Hospital Stay: Payer: Medicare Other

## 2022-06-09 VITALS — BP 98/54 | HR 86 | Temp 97.3°F | Resp 16 | Ht 67.0 in | Wt 319.9 lb

## 2022-06-09 DIAGNOSIS — D72829 Elevated white blood cell count, unspecified: Secondary | ICD-10-CM | POA: Diagnosis not present

## 2022-06-09 DIAGNOSIS — Z87891 Personal history of nicotine dependence: Secondary | ICD-10-CM | POA: Insufficient documentation

## 2022-06-09 DIAGNOSIS — D72820 Lymphocytosis (symptomatic): Secondary | ICD-10-CM

## 2022-06-09 DIAGNOSIS — I4891 Unspecified atrial fibrillation: Secondary | ICD-10-CM | POA: Diagnosis not present

## 2022-06-09 DIAGNOSIS — I509 Heart failure, unspecified: Secondary | ICD-10-CM | POA: Insufficient documentation

## 2022-06-09 LAB — CBC WITH DIFFERENTIAL (CANCER CENTER ONLY)
Abs Immature Granulocytes: 0.05 10*3/uL (ref 0.00–0.07)
Basophils Absolute: 0.1 10*3/uL (ref 0.0–0.1)
Basophils Relative: 1 %
Eosinophils Absolute: 0.4 10*3/uL (ref 0.0–0.5)
Eosinophils Relative: 3 %
HCT: 45.3 % (ref 36.0–46.0)
Hemoglobin: 14.7 g/dL (ref 12.0–15.0)
Immature Granulocytes: 0 %
Lymphocytes Relative: 48 %
Lymphs Abs: 6.6 10*3/uL — ABNORMAL HIGH (ref 0.7–4.0)
MCH: 29.3 pg (ref 26.0–34.0)
MCHC: 32.5 g/dL (ref 30.0–36.0)
MCV: 90.4 fL (ref 80.0–100.0)
Monocytes Absolute: 1 10*3/uL (ref 0.1–1.0)
Monocytes Relative: 7 %
Neutro Abs: 5.7 10*3/uL (ref 1.7–7.7)
Neutrophils Relative %: 41 %
Platelet Count: 264 10*3/uL (ref 150–400)
RBC: 5.01 MIL/uL (ref 3.87–5.11)
RDW: 16.5 % — ABNORMAL HIGH (ref 11.5–15.5)
WBC Count: 13.8 10*3/uL — ABNORMAL HIGH (ref 4.0–10.5)
nRBC: 0 % (ref 0.0–0.2)

## 2022-06-09 LAB — CMP (CANCER CENTER ONLY)
ALT: 9 U/L (ref 0–44)
AST: 11 U/L — ABNORMAL LOW (ref 15–41)
Albumin: 3.8 g/dL (ref 3.5–5.0)
Alkaline Phosphatase: 40 U/L (ref 38–126)
Anion gap: 4 — ABNORMAL LOW (ref 5–15)
BUN: 22 mg/dL (ref 8–23)
CO2: 30 mmol/L (ref 22–32)
Calcium: 9 mg/dL (ref 8.9–10.3)
Chloride: 106 mmol/L (ref 98–111)
Creatinine: 1.25 mg/dL — ABNORMAL HIGH (ref 0.44–1.00)
GFR, Estimated: 47 mL/min — ABNORMAL LOW (ref >60)
Glucose, Bld: 80 mg/dL (ref 70–99)
Potassium: 4.6 mmol/L (ref 3.5–5.1)
Sodium: 140 mmol/L (ref 135–145)
Total Bilirubin: 0.6 mg/dL (ref 0.3–1.2)
Total Protein: 6.9 g/dL (ref 6.5–8.1)

## 2022-06-10 ENCOUNTER — Ambulatory Visit (INDEPENDENT_AMBULATORY_CARE_PROVIDER_SITE_OTHER): Payer: Medicare Other

## 2022-06-10 VITALS — Ht 67.0 in | Wt 319.0 lb

## 2022-06-10 DIAGNOSIS — Z0000 Encounter for general adult medical examination without abnormal findings: Secondary | ICD-10-CM | POA: Diagnosis not present

## 2022-06-10 DIAGNOSIS — Z1231 Encounter for screening mammogram for malignant neoplasm of breast: Secondary | ICD-10-CM

## 2022-06-10 DIAGNOSIS — G4733 Obstructive sleep apnea (adult) (pediatric): Secondary | ICD-10-CM | POA: Diagnosis not present

## 2022-06-10 NOTE — Patient Instructions (Signed)
Julia West , Thank you for taking time to come for your Medicare Wellness Visit. I appreciate your ongoing commitment to your health goals. Please review the following plan we discussed and let me know if I can assist you in the future.   These are the goals we discussed:  Goals       AFIB, INSOMNIA (pt-stated)      Current Barriers:  Unable to independently afford treatment regimen Suboptimal therapeutic regimen for INSOMNIA  Pharmacist Clinical Goal(s):  Over the next 90 days, patient will verbalize ability to afford treatment regimen through collaboration with PharmD and provider.   Interventions: 1:1 collaboration with Julia Fraise, MD regarding development and update of comprehensive plan of care as evidenced by provider attestation and co-signature Inter-disciplinary care team collaboration (see longitudinal plan of care) Comprehensive medication review performed; medication list updated in electronic medical record  Atrial Fibrillation: controlled; current rate/rhythm control; TIKOSYN anticoagulant treatment: ELIQUIS CHADS2VASc score: 4 DENIES SIGNS/SYMPTOMS OF BLEEDING Home blood pressure, heart rate readings:  150S/70S Assessed patient finances. PATIENT to gather income documents--need 3% OOP TO QUALIFY FOR BMS PATIENT ASSISTANCE PROGRAM (ELIQUIS)--INFO FAXED TO [phone]   Hyperlipidema -patient tolerating atorvastatin well--f/u repeat labs & expect LDL to be at goal -reviewed purpose and side effects Lipid Panel     Component Value Date/Time   CHOL 144 08/19/2020 0909   TRIG 107 08/19/2020 0909   TRIG 247 (H) 09/12/2013 1137   HDL 41 08/19/2020 0909   HDL 35 (L) 09/12/2013 1137   CHOLHDL 3.5 08/19/2020 0909   LDLCALC 83 08/19/2020 0909   LDLCALC 115 (H) 09/12/2013 1137   LABVLDL 20 08/19/2020 0909   MEDICATION ASSISTANCE: GENERICS SENT TO [facility] PER PATIENT REQUEST (ATORVASTATIN, FENOFIBRATE, VALSARTAN, SERTRALINE INSOMNIA: UNCONTROLLED  AND DIFFICULT TO CHOOSE MEDICATION THAT DOESN'T INTERACT WITH TIKOSYN-->BELSOMRA WAS PRESCRIBED, TOLERATING WELL; ONLY ABLE TO CONTINUE ON BELSOMRA DUE TO PATIENT ASSISTANCE PROGRAM  Received notification from [facility] regarding approval for BELSOMRA 20MG . Patient assistance approved from 11/07/20 to 03/14/21.   APPLICATION SENT FOR RE-ENROLLMENT IN [facility]) FOR 2023   Phone: [phone]  Patient Goals/Self-Care Activities Over the next 90 days, patient will:  - take medications as prescribed  Follow Up Plan: Telephone follow up appointment with care management team member scheduled for: 2 MONTHS       Exercise 3x per week (30 min per time)        This is a list of the screening recommended for you and due dates:  Health Maintenance  Topic Date Due   Complete foot exam   Never done   Eye exam for diabetics  Never done   Zoster (Shingles) Vaccine (1 of 2) Never done   Stool Blood Test  03/26/2015   Mammogram  02/01/2017   DEXA scan (bone density measurement)  Never done   COVID-19 Vaccine (3 - Pfizer risk series) 12/14/2019   Yearly kidney health urinalysis for diabetes  08/15/2020   DTaP/Tdap/Td vaccine (2 - Td or Tdap) 12/16/2020   Flu Shot  06/13/2022*   Pneumonia Vaccine (2 of 2 - PCV) 11/05/2022*   Colon Cancer Screening  11/05/2022*   Screening for Lung Cancer  09/21/2022   Hemoglobin A1C  11/14/2022   Yearly kidney function blood test for diabetes  06/09/2023   Medicare Annual Wellness Visit  06/10/2023   Hepatitis C Screening: USPSTF Recommendation to screen - Ages 18-79 yo.  Completed   HPV Vaccine  Aged Out  *Topic was postponed. The date  shown is not the original due date.    Advanced directives: Advance directive discussed with you today. I have provided a copy for you to complete at home and have notarized. Once this is complete please bring a copy in to our office so we can scan it into your chart.   Conditions/risks identified: Aim for 30 minutes of  exercise or brisk walking, 6-8 glasses of water, and 5 servings of fruits and vegetables each day.   Next appointment: Follow up in one year for your annual wellness visit    Preventive Care 65 Years and Older, Female Preventive care refers to lifestyle choices and visits with your health care provider that can promote health and wellness. What does preventive care include? A yearly physical exam. This is also called an annual well check. Dental exams once or twice a year. Routine eye exams. Ask your health care provider how often you should have your eyes checked. Personal lifestyle choices, including: Daily care of your teeth and gums. Regular physical activity. Eating a healthy diet. Avoiding tobacco and drug use. Limiting alcohol use. Practicing safe sex. Taking low-dose aspirin every day. Taking vitamin and mineral supplements as recommended by your health care provider. What happens during an annual well check? The services and screenings done by your health care provider during your annual well check will depend on your age, overall health, lifestyle risk factors, and family history of disease. Counseling  Your health care provider may ask you questions about your: Alcohol use. Tobacco use. Drug use. Emotional well-being. Home and relationship well-being. Sexual activity. Eating habits. History of falls. Memory and ability to understand (cognition). Work and work Statistician. Reproductive health. Screening  You may have the following tests or measurements: Height, weight, and BMI. Blood pressure. Lipid and cholesterol levels. These may be checked every 5 years, or more frequently if you are over 43 years old. Skin check. Lung cancer screening. You may have this screening every year starting at age 62 if you have a 30-pack-year history of smoking and currently smoke or have quit within the past 15 years. Fecal occult blood test (FOBT) of the stool. You may have this  test every year starting at age 47. Flexible sigmoidoscopy or colonoscopy. You may have a sigmoidoscopy every 5 years or a colonoscopy every 10 years starting at age 58. Hepatitis C blood test. Hepatitis B blood test. Sexually transmitted disease (STD) testing. Diabetes screening. This is done by checking your blood sugar (glucose) after you have not eaten for a while (fasting). You may have this done every 1-3 years. Bone density scan. This is done to screen for osteoporosis. You may have this done starting at age 37. Mammogram. This may be done every 1-2 years. Talk to your health care provider about how often you should have regular mammograms. Talk with your health care provider about your test results, treatment options, and if necessary, the need for more tests. Vaccines  Your health care provider may recommend certain vaccines, such as: Influenza vaccine. This is recommended every year. Tetanus, diphtheria, and acellular pertussis (Tdap, Td) vaccine. You may need a Td booster every 10 years. Zoster vaccine. You may need this after age 66. Pneumococcal 13-valent conjugate (PCV13) vaccine. One dose is recommended after age 88. Pneumococcal polysaccharide (PPSV23) vaccine. One dose is recommended after age 22. Talk to your health care provider about which screenings and vaccines you need and how often you need them. This information is not intended to replace advice given  to you by your health care provider. Make sure you discuss any questions you have with your health care provider. Document Released: 03/28/2015 Document Revised: 11/19/2015 Document Reviewed: 12/31/2014 Elsevier Interactive Patient Education  2017 [place] Prevention in the Home Falls can cause injuries. They can happen to people of all ages. There are many things you can do to make your home safe and to help prevent falls. What can I do on the outside of my home? Regularly fix the edges of walkways and  driveways and fix any cracks. Remove anything that might make you trip as you walk through a door, such as a raised step or threshold. Trim any bushes or trees on the path to your home. Use bright outdoor lighting. Clear any walking paths of anything that might make someone trip, such as rocks or tools. Regularly check to see if handrails are loose or broken. Make sure that both sides of any steps have handrails. Any raised decks and porches should have guardrails on the edges. Have any leaves, snow, or ice cleared regularly. Use sand or salt on walking paths during winter. Clean up any spills in your garage right away. This includes oil or grease spills. What can I do in the bathroom? Use night lights. Install grab bars by the toilet and in the tub and shower. Do not use towel bars as grab bars. Use non-skid mats or decals in the tub or shower. If you need to sit down in the shower, use a plastic, non-slip stool. Keep the floor dry. Clean up any water that spills on the floor as soon as it happens. Remove soap buildup in the tub or shower regularly. Attach bath mats securely with double-sided non-slip rug tape. Do not have throw rugs and other things on the floor that can make you trip. What can I do in the bedroom? Use night lights. Make sure that you have a light by your bed that is easy to reach. Do not use any sheets or blankets that are too big for your bed. They should not hang down onto the floor. Have a firm chair that has side arms. You can use this for support while you get dressed. Do not have throw rugs and other things on the floor that can make you trip. What can I do in the kitchen? Clean up any spills right away. Avoid walking on wet floors. Keep items that you use a lot in easy-to-reach places. If you need to reach something above you, use a strong step stool that has a grab bar. Keep electrical cords out of the way. Do not use floor polish or wax that makes floors  slippery. If you must use wax, use non-skid floor wax. Do not have throw rugs and other things on the floor that can make you trip. What can I do with my stairs? Do not leave any items on the stairs. Make sure that there are handrails on both sides of the stairs and use them. Fix handrails that are broken or loose. Make sure that handrails are as long as the stairways. Check any carpeting to make sure that it is firmly attached to the stairs. Fix any carpet that is loose or worn. Avoid having throw rugs at the top or bottom of the stairs. If you do have throw rugs, attach them to the floor with carpet tape. Make sure that you have a light switch at the top of the stairs and the bottom of the  stairs. If you do not have them, ask someone to add them for you. What else can I do to help prevent falls? Wear shoes that: Do not have high heels. Have rubber bottoms. Are comfortable and fit you well. Are closed at the toe. Do not wear sandals. If you use a stepladder: Make sure that it is fully opened. Do not climb a closed stepladder. Make sure that both sides of the stepladder are locked into place. Ask someone to hold it for you, if possible. Clearly mark and make sure that you can see: Any grab bars or handrails. First and last steps. Where the edge of each step is. Use tools that help you move around (mobility aids) if they are needed. These include: Canes. Walkers. Scooters. Crutches. Turn on the lights when you go into a dark area. Replace any light bulbs as soon as they burn out. Set up your furniture so you have a clear path. Avoid moving your furniture around. If any of your floors are uneven, fix them. If there are any pets around you, be aware of where they are. Review your medicines with your doctor. Some medicines can make you feel dizzy. This can increase your chance of falling. Ask your doctor what other things that you can do to help prevent falls. This information is not  intended to replace advice given to you by your health care provider. Make sure you discuss any questions you have with your health care provider. Document Released: 12/26/2008 Document Revised: 08/07/2015 Document Reviewed: 04/05/2014 Elsevier Interactive Patient Education  2017 [employer].

## 2022-06-10 NOTE — Progress Notes (Signed)
Subjective:   Julia West is a 67 y.o. female who presents for Medicare Annual (Subsequent) preventive examination. I connected with  [name] on 06/10/22 by a audio enabled telemedicine application and verified that I am speaking with the correct person using two identifiers.  Patient Location: Home  Provider Location: Home Office  I discussed the limitations of evaluation and management by telemedicine. The patient expressed understanding and agreed to proceed.  Review of Systems          Objective:    There were no vitals filed for this visit. There is no height or weight on file to calculate BMI.     12/30/2021    9:51 AM 11/20/2021    8:21 PM 09/20/2021    1:47 PM 10/01/2020   10:37 AM 09/09/2020    1:00 PM 07/11/2020    7:47 AM 02/13/2018    8:06 AM  Advanced Directives  Does Patient Have a Medical Advance Directive? No No No No No No No  Would patient like information on creating a medical advance directive? No - Patient declined No - Patient declined No - Patient declined No - Patient declined No - Patient declined No - Patient declined No - Patient declined    Current Medications (verified) Outpatient Encounter Medications as of 06/10/2022  Medication Sig   acetaminophen (TYLENOL) 325 MG tablet Take 650 mg by mouth 2 (two) times daily as needed (back ache).   apixaban (ELIQUIS) 5 MG TABS tablet Take 1 tablet (5 mg total) by mouth 2 (two) times daily.   atorvastatin (LIPITOR) 20 MG tablet TAKE 1 TABLET BY MOUTH ONCE  DAILY   carvedilol (COREG) 6.25 MG tablet Take 1 tablet (6.25 mg total) by mouth 2 (two) times daily.   cetirizine (ZYRTEC) 10 MG tablet Take 10 mg by mouth daily as needed for allergies.   dapagliflozin propanediol (FARXIGA) 10 MG TABS tablet Take 1 tablet (10 mg total) by mouth daily before breakfast.   fenofibrate 160 MG tablet TAKE 1 TABLET BY MOUTH DAILY   fluticasone (FLONASE) 50 MCG/ACT nasal spray Place 1 spray into both nostrils  daily as needed for allergies or rhinitis.   furosemide (LASIX) 20 MG tablet Take 1 tablet (20 mg total) by mouth daily as needed.   nitroGLYCERIN (NITROSTAT) 0.4 MG SL tablet Place 1 tablet (0.4 mg total) under the tongue every 5 (five) minutes x 3 doses as needed for chest pain. If pain persist after call 911   sacubitril-valsartan (ENTRESTO) 49-51 MG Take 1 tablet by mouth 2 (two) times daily.   sertraline (ZOLOFT) 50 MG tablet TAKE 1 TABLET BY MOUTH DAILY   sodium chloride (OCEAN) 0.65 % SOLN nasal spray Place 2 sprays into both nostrils daily. (Patient taking differently: Place 2 sprays into both nostrils daily. As needed)   spironolactone (ALDACTONE) 25 MG tablet Take 1 tablet (25 mg total) by mouth daily.   No facility-administered encounter medications on file as of 06/10/2022.    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    S/p CABG x 2 in 2003   Cancer [facility])    basal cell face   Complication of anesthesia    states low O2 sats post-op 11/13, always slow to awaken   Degenerative joint disease    Dehiscence of amputation stump ([place])    left BKA   Depression  Fatty liver    HTN (hypertension)    Hyperlipidemia LDL goal <70    Incarcerated hernia    Internal and external hemorrhoids without complication    Lymphedema of arm    right   Morbid obesity (HCC)    OSA on CPAP    severe obstructive sleep apnea with an AHI of 68.8/h and O2 saturations as low as 69% meeting criteria for nocturnal hypoxemia with O2 saturations less than 88% for 272 minutes.  On CPAP at 8cm H2O   Rheumatoid arthritis (HCC)    Surgical wound, non healing ABDOMINAL    has wound vac @ 125 mm Hg   Ventral hernia    Past Surgical History:  Procedure Laterality Date   AMPUTATION Left 05/06/2017   Procedure: BELOW KNEE AMPUTATION;  Surgeon: Newt Minion, MD;  Location: [place];  Service: Orthopedics;  Laterality:  Left;   APPLICATION OF WOUND VAC     BOWEL RESECTION  02/07/2012   Procedure: SMALL BOWEL RESECTION;  Surgeon: Gayland Curry, MD,FACS;  Location: [place];  Service: General;;   CARDIOVASCULAR STRESS TEST  01-17-2012  DR HOCHREIN   LOW RISK NUCLEAR TEST   CARDIOVERSION N/A 07/14/2020   Procedure: CARDIOVERSION;  Surgeon: Arnoldo Lenis, MD;  Location: [facility];  Service: Endoscopy;  Laterality: N/A;   CARDIOVERSION N/A 09/22/2021   Procedure: CARDIOVERSION;  Surgeon: Jerline Pain, MD;  Location: [facility];  Service: Cardiovascular;  Laterality: N/A;   CARDIOVERSION N/A 12/30/2021   Procedure: CARDIOVERSION;  Surgeon: Geralynn Rile, MD;  Location: [place];  Service: Cardiovascular;  Laterality: N/A;   CESAREAN SECTION     x 4 in remote past   [place] GRAFT  2003   by Dr. Servando Snare. LIMA to the LAD, free RIMA to the circumflex. Stress perfusion study December 2009 with no high-risk areas of ischemia. She has a well-preserved ejection fraction   HYSTEROSCOPY WITH D & C N/A 05/14/2014   Procedure: DILATATION AND CURETTAGE (no specimen); HYSTEROSCOPY;  Surgeon: Jonnie Kind, MD;  Location: [facility];  Service: Gynecology;  Laterality: N/A;   I & D EXTREMITY Left 06/21/2017   Procedure: IRRIGATION AND DEBRIDEMENT OF LEFT LEG AMPUTATION SITE;  Surgeon: Wallace Going, DO;  Location: [facility];  Service: Plastics;  Laterality: Left;   INCISION AND DRAINAGE OF WOUND  04/17/2012   Procedure: IRRIGATION AND DEBRIDEMENT WOUND;  Surgeon: Theodoro Kos, DO;  Location: [place];  Service: Plastics;  Laterality: N/A;  OF ABDOMINAL WOUND, SURGICAL PREP AND PLACEMENT OF VAC, REMOVAL FOREHEAD SKIN LESION   INCISION AND DRAINAGE OF WOUND N/A 04/24/2012   Procedure: IRRIGATION AND DEBRIDEMENT WOUND;  Surgeon: Theodoro Kos, DO;  Location: [place];  Service: Plastics;  Laterality: N/A;  I & D ABDOMINAL WOUND WITH VAC AND ACELL    INCISION AND DRAINAGE OF WOUND N/A 05/01/2012   Procedure: IRRIGATION AND DEBRIDEMENT WOUND;  Surgeon: Theodoro Kos, DO;  Location: [place];  Service: Plastics;  Laterality: N/A;  WITH SURGICAL PREP AND PLACEMENT OF VAC   INCISION AND DRAINAGE OF WOUND N/A 05/08/2012   Procedure: IRRIGATION AND DEBRIDEMENT OF ABD WOUND SURGICAL PREP AND PLACEMENT OF VAC ;  Surgeon: Theodoro Kos, DO;  Location: [place];  Service: Plastics;  Laterality: N/A;  IRRIGATION AND DEBRIDEMENT OF ABD WOUND SURGICAL PREP AND PLACEMENT OF VAC    INCISION AND DRAINAGE OF WOUND N/A 05/15/2012  Procedure: IRRIGATION AND DEBRIDEMENT OF ABDOMINAL WOUND WITH POSSIBLE SURGICAL PREP AND PLACEMENT OF VAC;  Surgeon: Theodoro Kos, DO;  Location: [place];  Service: Plastics;  Laterality: N/A;   INCISION AND DRAINAGE OF WOUND N/A 05/22/2012   Procedure: IRRIGATION AND DEBRIDEMENT OF ABODOMINAL WOUND WITH  SURGICAL PREP AND VAC PLACEMENT;  Surgeon: Theodoro Kos, DO;  Location: [place];  Service: Plastics;  Laterality: N/A;   INCISION AND DRAINAGE OF WOUND N/A 06/28/2012   Procedure: IRRIGATION AND DEBRIDEMENT OF ABDOMINAL ULCER SURGICAL PREP AND PLACEMENT OF ACELL AND VAC;  Surgeon: Theodoro Kos, DO;  Location: [place];  Service: Plastics;  Laterality: N/A;   INCISIONAL HERNIA REPAIR  02/07/2012   Procedure: HERNIA REPAIR INCISIONAL;  Surgeon: Gayland Curry, MD,FACS;  Location: [place];  Service: General;;  Open, Primary repair, strangulated Incisional hernia.   LESION REMOVAL  04/17/2012   Procedure: LESION REMOVAL;  Surgeon: Theodoro Kos, DO;  Location: [place];  Service: Plastics;  Laterality: N/A;   CENTER OF FOREHEAD, REMOVAL FORHEAD SKIN LESION   ORIF TOE FRACTURE Left 02/09/2017   Procedure: EXCISION TALAR HEAD, INTERNAL FIXATION MEDIAL COLUMN LEFT FOOT;  Surgeon: Newt Minion, MD;  Location: [place];  Service: Orthopedics;   Laterality: Left;   POLYPECTOMY N/A 05/14/2014   Procedure: ENDOMETRIAL POLYPECTOMY;  Surgeon: Jonnie Kind, MD;  Location: [facility];  Service: Gynecology;  Laterality: N/A;   STUMP REVISION Left 08/31/2017   Procedure: REVISION LEFT BELOW KNEE AMPUTATION;  Surgeon: Newt Minion, MD;  Location: [place];  Service: Orthopedics;  Laterality: Left;   Family History  Problem Relation Age of Onset   Heart attack Mother    Hypertension Mother    Heart disease Mother    Sudden death Mother    Obesity Mother    Hypertension Sister    Diabetes Brother    Birth defects Son        heart defect   Social History   Socioeconomic History   Marital status: Married    Spouse name: Eddie Dibbles   Number of children: 5   Years of education: Not on file   Highest education level: GED or equivalent  Occupational History   Occupation: Estate manager/land agent    Comment: In the Risk [employer]   Occupation: retired  Tobacco Use   Smoking status: Former    Packs/day: 1.00    Years: 25.00    Additional pack years: 0.00    Total pack years: 25.00    Types: Cigarettes    Quit date: 11/17/2011    Years since quitting: 10.5    Passive exposure: Current   Smokeless tobacco: Never  Vaping Use   Vaping Use: Never used  Substance and Sexual Activity   Alcohol use: No   Drug use: No   Sexual activity: Yes    Birth control/protection: Post-menopausal  Other Topics Concern   Not on file  Social History Narrative   Lives in [place] with her husband. 5 children. One of whom has pulmonary stenosis.    Left BKA, uses crutches - doesn't get much exercise   Social Determinants of Health   Financial Resource Strain: Low Risk  (09/22/2021)   Overall Financial Resource Strain (CARDIA)    Difficulty of Paying Living Expenses: Not very hard  Food Insecurity: No Food Insecurity (09/22/2021)   Hunger Vital Sign    Worried About Running Out of Food in the Last Year: Never true  Ran Out of Food in the Last Year: Never true   Transportation Needs: No Transportation Needs (09/22/2021)   PRAPARE - Hydrologist (Medical): No    Lack of Transportation (Non-Medical): No  Physical Activity: Insufficiently Active (10/01/2020)   Exercise Vital Sign    Days of Exercise per Week: 7 days    Minutes of Exercise per Session: 10 min  Stress: No Stress Concern Present (10/01/2020)   [place]    Feeling of Stress : Not at all  Social Connections: Moderately Isolated (10/01/2020)   Social Connection and Isolation Panel [NHANES]    Frequency of Communication with Friends and Family: More than three times a week    Frequency of Social Gatherings with Friends and Family: Twice a week    Attends Religious Services: Never    Marine scientist or Organizations: No    Attends Archivist Meetings: Never    Marital Status: Married    Tobacco Counseling Counseling given: Not Answered   Clinical Intake:              How often do you need to have someone help you when you read instructions, pamphlets, or other written materials from your doctor or pharmacy?: (P) 1 - Never  Diabetic?no          Activities of Daily Living    06/06/2022   12:55 PM 09/25/2021   10:02 AM  In your present state of health, do you have any difficulty performing the following activities:  Hearing? 0   Vision? 0   Difficulty concentrating or making decisions? 0   Walking or climbing stairs? 0   Dressing or bathing? 0   Doing errands, shopping?  0  Preparing Food and eating ? N   Using the Toilet? N   In the past six months, have you accidently leaked urine? Y   Do you have problems with loss of bowel control? N   Managing your Medications? N   Managing your Finances? N   Housekeeping or managing your Housekeeping? N     Patient Care Team: [name], MD as PCP - General (Family Medicine) [name], MD as PCP -  Cardiology (Cardiology) Constance Haw, MD as PCP - Electrophysiology (Cardiology) Lavera Guise, [facility] as Pharmacist (Family Medicine)  Indicate any recent Medical Services you may have received from other than [facility] providers in the past year (date may be approximate).     Assessment:   This is a routine wellness examination for Julia West.  Hearing/Vision screen No results found.  Dietary issues and exercise activities discussed:     Goals Addressed   None    Depression Screen    11/04/2021    9:45 AM 11/04/2021    9:30 AM 10/01/2020   10:32 AM 05/27/2020    9:45 AM 02/19/2020    8:15 AM 08/16/2019    8:32 AM 12/18/2018    3:48 PM  PHQ 2/9 Scores  PHQ - 2 Score 0 0 0 2 0 0 0  PHQ- 9 Score 3   8       Fall Risk    06/06/2022   12:55 PM 11/04/2021    9:44 AM 11/04/2021    9:30 AM 10/01/2020   10:38 AM 09/25/2020    9:46 AM  Fall Risk   Falls in the past year? 0 1 0 1 1  Number falls in past yr:  0  0 0  Injury with Fall? 0 0  0 0  Risk for fall due to :  History of fall(s);Orthopedic patient;Impaired balance/gait  History of fall(s);Impaired mobility;Orthopedic patient History of fall(s);Impaired mobility  Follow up  Falls evaluation completed  Falls prevention discussed;Education provided Falls evaluation completed    FALL RISK PREVENTION PERTAINING TO THE HOME:  Any stairs in or around the home? No  If so, are there any without handrails? No  Home free of loose throw rugs in walkways, pet beds, electrical cords, etc? Yes  Adequate lighting in your home to reduce risk of falls? Yes   ASSISTIVE DEVICES UTILIZED TO PREVENT FALLS:  Life alert? No  Use of a cane, walker or w/c? Yes  Grab bars in the bathroom? Yes  Shower chair or bench in shower? Yes  Elevated toilet seat or a handicapped toilet? Yes          10/01/2020   11:00 AM  6CIT Screen  What Year? 0 points  What month? 0 points  What time? 0 points  Count back from 20 0 points  Months in reverse 0  points  Repeat phrase 0 points  Total Score 0 points    Immunizations Immunization History  Administered Date(s) Administered   Fluad Quad(high Dose 65+) 12/18/2018, 12/10/2019   Influenza,inj,Quad PF,6+ Mos 01/12/2013, 01/09/2016, 12/10/2016, 01/25/2018   Influenza-Unspecified 12/10/2016, 12/18/2018   PFIZER(Purple Top)SARS-COV-2 Vaccination 10/20/2019, 11/16/2019   Pneumococcal Polysaccharide-23 09/10/2020   Tdap 12/17/2010    TDAP status: Due, Education has been provided regarding the importance of this vaccine. Advised may receive this vaccine at local pharmacy or Health Dept. Aware to provide a copy of the vaccination record if obtained from local pharmacy or Health Dept. Verbalized acceptance and understanding.  Flu Vaccine status: Declined, Education has been provided regarding the importance of this vaccine but patient still declined. Advised may receive this vaccine at local pharmacy or Health Dept. Aware to provide a copy of the vaccination record if obtained from local pharmacy or Health Dept. Verbalized acceptance and understanding.  Pneumococcal vaccine status: Up to date  Covid-19 vaccine status: Completed vaccines  Qualifies for Shingles Vaccine? Yes   Zostavax completed No   Shingrix Completed?: No.    Education has been provided regarding the importance of this vaccine. Patient has been advised to call insurance company to determine out of pocket expense if they have not yet received this vaccine. Advised may also receive vaccine at local pharmacy or Health Dept. Verbalized acceptance and understanding.  Screening Tests Health Maintenance  Topic Date Due   FOOT EXAM  Never done   OPHTHALMOLOGY EXAM  Never done   Zoster Vaccines- Shingrix (1 of 2) Never done   COLON CANCER SCREENING ANNUAL FOBT  03/26/2015   MAMMOGRAM  02/01/2018   DEXA SCAN  Never done   COVID-19 Vaccine (3 - Pfizer risk series) 12/14/2019   Diabetic kidney evaluation - Urine ACR  08/15/2020    DTaP/Tdap/Td (2 - Td or Tdap) 12/16/2020   Medicare Annual Wellness (AWV)  10/01/2021   INFLUENZA VACCINE  06/13/2022 (Originally 10/13/2021)   Pneumonia Vaccine 27+ Years old (2 of 2 - PCV) 11/05/2022 (Originally 09/10/2021)   COLONOSCOPY (Pts 45-33yrs Insurance coverage will need to be confirmed)  11/05/2022 (Originally 04/18/1998)   Lung Cancer Screening  09/21/2022   HEMOGLOBIN A1C  11/14/2022   Diabetic kidney evaluation - eGFR measurement  06/09/2023   Hepatitis C Screening  Completed   HPV VACCINES  Aged  Out    Health Maintenance  Health Maintenance Due  Topic Date Due   FOOT EXAM  Never done   OPHTHALMOLOGY EXAM  Never done   Zoster Vaccines- Shingrix (1 of 2) Never done   COLON CANCER SCREENING ANNUAL FOBT  03/26/2015   MAMMOGRAM  02/01/2018   DEXA SCAN  Never done   COVID-19 Vaccine (3 - Pfizer risk series) 12/14/2019   Diabetic kidney evaluation - Urine ACR  08/15/2020   DTaP/Tdap/Td (2 - Td or Tdap) 12/16/2020   Medicare Annual Wellness (AWV)  10/01/2021    Colorectal cancer screening: Referral to GI placed has a Cologuard lit to complete. Pt aware the office will call re: appt.  Mammogram status: Ordered 06/10/2022. Pt provided with contact info and advised to call to schedule appt.   Bone Density status: Ordered declined . Pt provided with contact info and advised to call to schedule appt.  Lung Cancer Screening: (Low Dose CT Chest recommended if Age 20-80 years, 30 pack-year currently smoking OR have quit w/in 15years.) does not qualify.   Lung Cancer Screening Referral: n/a  Additional Screening:  Hepatitis C Screening: does not qualify; Completed 12/18/2018  Vision Screening: Recommended annual ophthalmology exams for early detection of glaucoma and other disorders of the eye. Is the patient up to date with their annual eye exam?  Yes  Who is the provider or what is the name of the office in which the patient attends annual eye exams? [facility]   If pt is not established with a provider, would they like to be referred to a provider to establish care? No .   Dental Screening: Recommended annual dental exams for proper oral hygiene  Community Resource Referral / Chronic Care Management: CRR required this visit?  No   CCM required this visit?  No      Plan:     I have personally reviewed and noted the following in the patient's chart:   Medical and social history Use of alcohol, tobacco or illicit drugs  Current medications and supplements including opioid prescriptions. Patient is currently taking opioid prescriptions. Information provided to patient regarding non-opioid alternatives. Patient advised to discuss non-opioid treatment plan with their provider. Functional ability and status Nutritional status Physical activity Advanced directives List of other physicians Hospitalizations, surgeries, and ER visits in previous 12 months Vitals Screenings to include cognitive, depression, and falls Referrals and appointments  In addition, I have reviewed and discussed with patient certain preventive protocols, quality metrics, and best practice recommendations. A written personalized care plan for preventive services as well as general preventive health recommendations were provided to patient.     Daphane Shepherd, LPN   [id number]   Nurse Notes: Dur Tdap Vaccine

## 2022-06-12 DIAGNOSIS — G4733 Obstructive sleep apnea (adult) (pediatric): Secondary | ICD-10-CM | POA: Diagnosis not present

## 2022-06-14 ENCOUNTER — Telehealth (HOSPITAL_COMMUNITY): Payer: Self-pay | Admitting: Cardiology

## 2022-06-14 MED ORDER — CARVEDILOL 12.5 MG PO TABS: 12.5000 mg | ORAL_TABLET | Freq: Two times a day (BID) | ORAL | 3 refills | Status: DC

## 2022-06-14 MED ORDER — WEGOVY 0.25 MG/0.5ML ~~LOC~~ SOAJ: 0.2500 mg | SUBCUTANEOUS | 0 refills | Status: DC

## 2022-06-14 NOTE — Telephone Encounter (Signed)
-----   Message from Larey Dresser, MD sent at 06/13/2022  8:43 PM EDT ----- PVCs 6.5%, less than in the past but still present.  Would increase Coreg to 12.5 mg bid.

## 2022-06-14 NOTE — Telephone Encounter (Addendum)
Called pt's insurance, appeal has been overturned and [place] approved. Rx sent to pharmacy to determine copay.  Copay $207, [facility] able to get sporadically. Called pt to discuss, she will think about it but cost is pricy.

## 2022-06-14 NOTE — Telephone Encounter (Signed)
Patient called.  Patient aware and voiced understanding  ?

## 2022-06-16 ENCOUNTER — Ambulatory Visit (INDEPENDENT_AMBULATORY_CARE_PROVIDER_SITE_OTHER): Payer: Medicare Other | Admitting: Family Medicine

## 2022-06-16 ENCOUNTER — Encounter: Payer: Self-pay | Admitting: Family Medicine

## 2022-06-16 ENCOUNTER — Ambulatory Visit: Payer: Medicare Other | Admitting: Family Medicine

## 2022-06-16 VITALS — BP 126/71 | HR 88 | Temp 97.1°F | Ht 67.0 in | Wt 320.6 lb

## 2022-06-16 DIAGNOSIS — Z6841 Body Mass Index (BMI) 40.0 and over, adult: Secondary | ICD-10-CM

## 2022-06-16 DIAGNOSIS — E11610 Type 2 diabetes mellitus with diabetic neuropathic arthropathy: Secondary | ICD-10-CM

## 2022-06-16 DIAGNOSIS — I10 Essential (primary) hypertension: Secondary | ICD-10-CM | POA: Diagnosis not present

## 2022-06-16 DIAGNOSIS — E785 Hyperlipidemia, unspecified: Secondary | ICD-10-CM

## 2022-06-16 DIAGNOSIS — E782 Mixed hyperlipidemia: Secondary | ICD-10-CM | POA: Diagnosis not present

## 2022-06-16 MED ORDER — ATORVASTATIN CALCIUM 20 MG PO TABS: 20.0000 mg | ORAL_TABLET | Freq: Every day | ORAL | 1 refills | Status: DC

## 2022-06-16 MED ORDER — SALINE SPRAY 0.65 % NA SOLN: 2.0000 | Freq: Every day | NASAL | Status: AC

## 2022-06-16 MED ORDER — CETIRIZINE HCL 10 MG PO TABS: 10.0000 mg | ORAL_TABLET | Freq: Every day | ORAL | 3 refills | Status: AC | PRN

## 2022-06-16 MED ORDER — FENOFIBRATE 160 MG PO TABS: 160.0000 mg | ORAL_TABLET | Freq: Every day | ORAL | 3 refills | Status: DC

## 2022-06-16 MED ORDER — FLUTICASONE PROPIONATE 50 MCG/ACT NA SUSP: 1.0000 | Freq: Every day | NASAL | 3 refills | Status: DC | PRN

## 2022-06-16 MED ORDER — AMOXICILLIN-POT CLAVULANATE 875-125 MG PO TABS: 1.0000 | ORAL_TABLET | Freq: Two times a day (BID) | ORAL | 0 refills | Status: DC

## 2022-06-16 MED ORDER — SERTRALINE HCL 50 MG PO TABS: 50.0000 mg | ORAL_TABLET | Freq: Every day | ORAL | 2 refills | Status: DC

## 2022-06-16 NOTE — Progress Notes (Signed)
Subjective:  Patient ID: Julia West, female    DOB: 31-May-1955  Age: 67 y.o. MRN: [id number]  CC: Medical Management of Chronic Issues   HPI Julia West presents for regular check up Was approved for wegovy, but couldn't afford the $200 copay. Still trying to find ways to deal with morbid oesity.   presents for  follow-up of hypertension. Patient has no history of headache chest pain or shortness of breath or recent cough. Patient also denies symptoms of TIA such as focal numbness or weakness. Patient denies side effects from medication. States taking it regularly.  in for follow-up of elevated cholesterol. Doing well without complaints on current medication. Denies side effects of statin including myalgia and arthralgia and nausea. Currently no chest pain, shortness of breath or other cardiovascular related symptoms noted.  presents forFollow-up of diabetes. Patient denies symptoms such as polyuria, polydipsia, excessive hunger, nausea No significant hypoglycemic spells noted. Medications reviewed. Pt reports taking them regularly without complication/adverse reaction being reported today.  Lab Results  Component Value Date   HGBA1C 5.8 (H) 05/14/2022   HGBA1C 5.9 (H) 01/29/2022   HGBA1C 5.7 (H) 11/04/2021          06/16/2022   10:45 AM 06/10/2022    8:49 AM 11/04/2021    9:45 AM  Depression screen PHQ 2/9  Decreased Interest 0 0 0  Down, Depressed, Hopeless 0 0 0  PHQ - 2 Score 0 0 0  Altered sleeping   2  Tired, decreased energy   1  Change in appetite   0  Feeling bad or failure about yourself    0  Trouble concentrating   0  Moving slowly or fidgety/restless   0  Suicidal thoughts   0  PHQ-9 Score   3  Difficult doing work/chores   Not difficult at all    History Julia West has a past medical history of Anxiety, 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   She has a past surgical history that includes Cesarean section; Cholecystectomy; Coronary artery bypass graft (2003); Incisional hernia repair (02/07/2012); Bowel resection (02/07/2012); Application if wound vac; Incision and drainage of wound (04/17/2012); Lesion removal (04/17/2012); Incision and drainage of wound (N/A, 04/24/2012); Incision and drainage of wound (N/A, 05/01/2012); Incision and drainage of wound (N/A, 05/08/2012); Cardiovascular stress test (01-17-2012  [facility]); Incision and drainage of wound (N/A, 05/15/2012); Incision and drainage of wound (N/A, 05/22/2012); Incision and drainage of wound (N/A, 06/28/2012); polypectomy (N/A, 05/14/2014); Hysteroscopy with D & C (N/A, 05/14/2014); ORIF toe fracture (Left, 02/09/2017); Amputation (Left, 05/06/2017); I & D extremity (Left, 06/21/2017); Stump revision (Left, 08/31/2017); Cardioversion (N/A, 07/14/2020); Cardioversion (N/A, 09/22/2021); and Cardioversion (N/A, 12/30/2021).   Her family history includes Birth defects in her son; Diabetes in her brother; Heart attack in her mother; Heart disease in her mother; Hypertension in her mother and sister; Obesity in her mother; Sudden death in her mother.She reports that she quit smoking about 10 years ago. Her smoking use included cigarettes. She has a 25.00 pack-year smoking history. She has been exposed to tobacco smoke. She has never used smokeless tobacco. She reports that she does not drink alcohol and does not use drugs.    ROS  Review of Systems  Constitutional: Negative.   HENT: Negative.    Eyes:  Negative for visual disturbance.  Respiratory:  Negative for shortness of breath.   Cardiovascular:  Negative for chest pain.  Gastrointestinal:   Negative for abdominal pain.  Musculoskeletal:  Positive for gait problem (LLE BKA). Negative for arthralgias.    Objective:  BP 126/71   Pulse 88   Temp (!) 97.1 F (36.2 C)   Ht 5\' 7"  (1.702 m)   Wt (!) 320 lb 9.6 oz (145.4 kg)   SpO2 90%   BMI 50.21 kg/m   BP Readings from Last 3 Encounters:  06/17/22 110/72  06/16/22 126/71  06/09/22 (!) 98/54    Wt Readings from Last 3 Encounters:  06/17/22 (!) 320 lb (145.2 kg)  06/16/22 (!) 320 lb 9.6 oz (145.4 kg)  06/10/22 (!) 319 lb (144.7 kg)     Physical Exam Constitutional:      General: She is not in acute distress.    Appearance: She is well-developed.  Cardiovascular:     Rate and Rhythm: Normal rate and regular rhythm.  Pulmonary:     Breath sounds: Normal breath sounds.  Musculoskeletal:        General: Normal range of motion.  Skin:    General: Skin is warm and dry.  Neurological:     Mental Status: She is alert and oriented to person, place, and time.    Results for orders placed or performed in visit on 06/09/22  CMP (Cancer Center only)  Result Value Ref Range   Sodium 140 135 - 145 mmol/L   Potassium 4.6 3.5 - 5.1 mmol/L   Chloride 106 98 - 111 mmol/L   CO2 30 22 - 32 mmol/L   Glucose, Bld 80 70 - 99 mg/dL   BUN 22 8 - 23 mg/dL   Creatinine 9.511.25 (H) 8.840.44 - 1.00 mg/dL   Calcium 9.0 8.9 - 16.610.3 mg/dL   Total Protein 6.9 6.5 - 8.1 g/dL   Albumin 3.8 3.5 - 5.0 g/dL   AST 11 (L) 15 - 41 U/L   ALT 9 0 - 44 U/L   Alkaline Phosphatase 40 38 - 126 U/L   Total Bilirubin 0.6 0.3 - 1.2 mg/dL   GFR, Estimated 47 (L) >60 mL/min   Anion gap 4 (L) 5 - 15  CBC with Differential (Cancer Center Only)  Result Value Ref Range   WBC Count 13.8 (H) 4.0 - 10.5 K/uL   RBC 5.01 3.87 - 5.11 MIL/uL   Hemoglobin 14.7 12.0 - 15.0 g/dL   HCT 06.345.3 01.636.0 - 01.046.0 %   MCV 90.4 80.0 - 100.0 fL   MCH 29.3 26.0 - 34.0 pg   MCHC 32.5 30.0 - 36.0 g/dL   RDW 93.216.5 (H) 35.511.5 - 73.215.5 %   Platelet Count 264 150 - 400 K/uL   nRBC 0.0 0.0  - 0.2 %   Neutrophils Relative % 41 %   Neutro Abs 5.7 1.7 - 7.7 K/uL   Lymphocytes Relative 48 %   Lymphs Abs 6.6 (H) 0.7 - 4.0 K/uL   Monocytes Relative 7 %   Monocytes Absolute 1.0 0.1 - 1.0 K/uL   Eosinophils Relative 3 %   Eosinophils Absolute 0.4 0.0 - 0.5 K/uL   Basophils Relative 1 %   Basophils Absolute 0.1 0.0 - 0.1 K/uL   WBC Morphology SMUDGE CELLS    RBC Morphology MORPHOLOGY UNREMARKABLE    Smear Review LARGE PLATELETS SEEN  Immature Granulocytes 0 %   Abs Immature Granulocytes 0.05 0.00 - 0.07 K/uL    A1c on 3/1 was 5.8  Assessment & Plan:   Julia West was seen today for medical management of chronic issues.  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 -     fenofibrate 160 MG tablet; Take 1 tablet (160 mg total) by mouth daily.  Hyperlipidemia, unspecified hyperlipidemia type  Other orders -     sertraline (ZOLOFT) 50 MG tablet; Take 1 tablet (50 mg total) by mouth daily. -     amoxicillin-clavulanate (AUGMENTIN) 875-125 MG tablet; Take 1 tablet by mouth 2 (two) times daily. Take all of this medication -     cetirizine (ZYRTEC) 10 MG tablet; Take 1 tablet (10 mg total) by mouth daily as needed for allergies. -     fluticasone (FLONASE) 50 MCG/ACT nasal spray; Place 1 spray into both nostrils daily as needed for allergies or rhinitis. -     sodium chloride (OCEAN) 0.65 % SOLN nasal spray; Place 2 sprays into both nostrils daily. As needed (Patient not taking: Reported on 06/17/2022)       I have discontinued Julia West's acetaminophen. I have also changed her atorvastatin, sertraline, cetirizine, fenofibrate, and sodium chloride. Additionally, I am having her start on amoxicillin-clavulanate.  Lastly, I am having her maintain her furosemide, nitroGLYCERIN, apixaban, dapagliflozin propanediol, spironolactone, carvedilol, Wegovy, and fluticasone.  Allergies as of 06/16/2022       Reactions   Ace Inhibitors Cough        Medication List        Accurate as of June 16, 2022 11:59 PM. If you have any questions, ask your nurse or doctor.          STOP taking these medications    acetaminophen 325 MG tablet Commonly known as: TYLENOL Stopped by: Mechele Claude, MD       TAKE these medications    amoxicillin-clavulanate 875-125 MG tablet Commonly known as: AUGMENTIN Take 1 tablet by mouth 2 (two) times daily. Take all of this medication Started by: Mechele Claude, MD   apixaban 5 MG Tabs tablet Commonly known as: Eliquis Take 1 tablet (5 mg total) by mouth 2 (two) times daily.   atorvastatin 20 MG tablet Commonly known as: LIPITOR Take 1 tablet (20 mg total) by mouth daily.   carvedilol 12.5 MG tablet Commonly known as: COREG Take 1 tablet (12.5 mg total) by mouth 2 (two) times daily.   cetirizine 10 MG tablet Commonly known as: ZYRTEC Take 1 tablet (10 mg total) by mouth daily as needed for allergies.   dapagliflozin propanediol 10 MG Tabs tablet Commonly known as: Farxiga Take 1 tablet (10 mg total) by mouth daily before breakfast.   fenofibrate 160 MG tablet Take 1 tablet (160 mg total) by mouth daily.   fluticasone 50 MCG/ACT nasal spray Commonly known as: FLONASE Place 1 spray into both nostrils daily as needed for allergies or rhinitis.   furosemide 20 MG tablet Commonly known as: LASIX Take 1 tablet (20 mg total) by mouth daily as needed.   nitroGLYCERIN 0.4 MG SL tablet Commonly known as: NITROSTAT Place 1 tablet (0.4 mg total) under the tongue every  5 (five) minutes x 3 doses as needed for chest pain. If pain persist after call 911   sacubitril-valsartan 49-51 MG Commonly known as: ENTRESTO Take 1 tablet by mouth 2 (two) times daily.    sertraline 50 MG tablet Commonly known as: ZOLOFT Take 1 tablet (50 mg total) by mouth daily.   sodium chloride 0.65 % Soln nasal spray Commonly known as: OCEAN Place 2 sprays into both nostrils daily. As needed   spironolactone 25 MG tablet Commonly known as: ALDACTONE Take 1 tablet (25 mg total) by mouth daily.   Wegovy 0.25 MG/0.5ML Soaj Generic drug: Semaglutide-Weight Management Inject 0.25 mg into the skin once a week.         Follow-up: Return in about 6 months (around 12/16/2022).  Mechele Claude, M.D.

## 2022-06-17 ENCOUNTER — Other Ambulatory Visit (HOSPITAL_COMMUNITY): Payer: Self-pay

## 2022-06-17 ENCOUNTER — Ambulatory Visit (HOSPITAL_COMMUNITY)
Admission: RE | Admit: 2022-06-17 | Discharge: 2022-06-17 | Disposition: A | Discharge status: Home / Self Care | Payer: Medicare Other | Source: Ambulatory Visit | Attending: Cardiology | Admitting: Cardiology

## 2022-06-17 VITALS — BP 110/72 | HR 74 | Wt 320.0 lb

## 2022-06-17 DIAGNOSIS — I5022 Chronic systolic (congestive) heart failure: Secondary | ICD-10-CM

## 2022-06-17 DIAGNOSIS — I739 Peripheral vascular disease, unspecified: Secondary | ICD-10-CM | POA: Diagnosis not present

## 2022-06-17 DIAGNOSIS — Z79899 Other long term (current) drug therapy: Secondary | ICD-10-CM | POA: Insufficient documentation

## 2022-06-17 DIAGNOSIS — Z6841 Body Mass Index (BMI) 40.0 and over, adult: Secondary | ICD-10-CM | POA: Insufficient documentation

## 2022-06-17 DIAGNOSIS — I110 Hypertensive heart disease with heart failure: Secondary | ICD-10-CM | POA: Insufficient documentation

## 2022-06-17 DIAGNOSIS — Z89512 Acquired absence of left leg below knee: Secondary | ICD-10-CM | POA: Insufficient documentation

## 2022-06-17 DIAGNOSIS — Z7901 Long term (current) use of anticoagulants: Secondary | ICD-10-CM | POA: Insufficient documentation

## 2022-06-17 DIAGNOSIS — Z951 Presence of aortocoronary bypass graft: Secondary | ICD-10-CM | POA: Diagnosis not present

## 2022-06-17 DIAGNOSIS — I480 Paroxysmal atrial fibrillation: Secondary | ICD-10-CM | POA: Diagnosis not present

## 2022-06-17 DIAGNOSIS — G4733 Obstructive sleep apnea (adult) (pediatric): Secondary | ICD-10-CM | POA: Diagnosis not present

## 2022-06-17 DIAGNOSIS — E669 Obesity, unspecified: Secondary | ICD-10-CM | POA: Insufficient documentation

## 2022-06-17 DIAGNOSIS — I2510 Atherosclerotic heart disease of native coronary artery without angina pectoris: Secondary | ICD-10-CM | POA: Insufficient documentation

## 2022-06-17 MED ORDER — ENTRESTO 97-103 MG PO TABS: 1.0000 | ORAL_TABLET | Freq: Two times a day (BID) | ORAL | 3 refills | Status: DC

## 2022-06-17 NOTE — Patient Instructions (Signed)
It was a pleasure seeing you today!  MEDICATIONS: -We are changing your medications today -Increase Entresto 97/103 mg twice daily -Call if you have questions about your medications.  LABS: -We will call you if your labs need attention.  NEXT APPOINTMENT: Return to clinic on 08/13/22 with HF Clinic.  In general, to take care of your heart failure: -Limit your fluid intake to 2 Liters (half-gallon) per day.   -Limit your salt intake to ideally 2-3 grams (2000-3000 mg) per day. -Weigh yourself daily and record, and bring that "weight diary" to your next appointment.  (Weight gain of 2-3 pounds in 1 day typically means fluid weight.) -The medications for your heart are to help your heart and help you live longer.   -Please contact us before stopping any of your heart medications.  Call the clinic at [phone] with questions or to reschedule future appointments.

## 2022-06-17 NOTE — Progress Notes (Signed)
Advanced Heart Failure Clinic Note  Primary Care: [name], MD HF Cardiology: Dr Aundra Dubin  HPI:  Referred to clinic initially by Dr Cathlean Sauer for heart failure consultation.    Julia West is a 67 y.o. with a history of PAF, HTN, CAD, CABG 2003, hyperlipidemia, LBKA, lymphedema RLE/RUE, and chronic HFrEF.    Admitted in 09/2021 with atrial fibrillation/RVR and acute systolic CHF. Diuresed with IV Lasix. Echo showed newly reduced EF 30-35%. Had DC-CV 09/22/21 with restoration of SR. Plan to f/u with Dr Curt Bears for ablation. Started on SGLT2i and MRA. Discharged on BB and ARB. Discharge weight 305 pounds.    Zio in 10/2021 showed 19.2% PVCs. She was transitioned from Tikosyn to amiodarone. Atrial fibrillation was persistent, she failed DCCV in 12/2021. She saw Dr. Curt Bears, decided against ablation due to obesity. Amiodarone was stopped. She has been in rate-controlled AF since that time.     On 05/14/22, she returned to ADHF clinic for follow up. She reported utilizing CPAP nightly, along with a prosthetic left lower leg. She also reported to use crutches for balance when she walking. Occasional chest "pinching" previously reported that would last for several seconds, nothing prolonged or exertional. Denied orthopnea/PND. Reported medication tolerability without lightheadedness. Baseline exertional dyspnea with walking 100-200 feet. She has stopped smoking smoking. No BRBPR/melena. Reported taking Lasix once or twice a week  Today she returns to HF clinic for pharmacist medication titration. At last visit with MD, spironolactone was increased to 25 mg once daily and she was instructed to take Lasix 20 mg every other day. On 06/14/22, her carvedilol was increased to 12.5 mg BID as her PVC burden remained at 6.5%. She is accompanied by her husband for the visit today. Overall she has been doing well. Denies palpitations, lightheadedness or dizziness. At baseline for fatigue. Continues to endorse chest  "pinching." Denies SOB, unless she is walking long distances. No SOB while walking around the grocery store. Weight at home 316 pounds. Takes Lasix 20 mg every other day. No LEE on exam, however she does have RLE/RLE lymphedema at baseline. Denies PND/orthopnea. Appetite is good and she watches her salt intake. Mancel Parsons was recently approved through her insurance, however, the medication remains over $200 a month. She is not planning to purchase Wegovy at this time. Her Eliquis copay is over $100 per month, however she is getting this medication.   HF Medications: Carvedilol 12.5 mg BID  Entresto 49/51 BID   Spironolactone 25 mg daily  Farxiga 10 mg daily  Lasix 20 mg every other day   Has the patient been experiencing any side effects to the medications prescribed?  no  Does the patient have any problems obtaining medications due to transportation or finances?   [place] expensive on insurance. Has been approved for [employer] patient assistance for Entresto and [employer] PAP for Farxiga through 03/15/2023.  Understanding of regimen: excellent Understanding of indications: excellent Potential of compliance: excellent Patient understands to avoid NSAIDs. Patient understands to avoid decongestants.    Pertinent Lab Values: 06/09/22: Serum creatinine 1.25, BUN 22, Potassium 4.6, Sodium 140, BNP 199.1  Vital Signs: Weight: 320 lbs (last clinic weight: 314 lbs) Blood pressure: 110/72 mmHg Heart rate: 74   Assessment/Plan: 1. Chronic systolic CHF:  Echo [id number] down to 30-35% from previous 50-55% back 2022.  Patient was in atrial fibrillation with RVR, ?tachy-mediated cardiomyopathy.  Echo in 01/2022 with controlled rate (still in atrial fibrillation) showed EF still low  at 40-45%, mild LV dilation, moderate RV dilation and moderately decreased RV systolic function.  Also possible that PVCs play a role as well as CAD (history of CABG).  NYHA class II symptoms. Not  volume-overloaded on exam - Continue Lasix 20 mg every other day.   - Continue carvedilol 12.5 mg BID  - Increase Entresto to 97/103 BID.   - Continue spironolactone 25 mg daily.  - Continue Farxiga 10 mg daily.  - Her insurance previously refused RHC/LHC after last appointment (fall in EF, known CAD).  Per Dr. Aundra Dubin, will hold off given stable symptoms currently and treat medically for now.   2. Atrial fibrillation: Now chronic.  She has failed amiodarone and Tikosyn as well as DCCV.  She saw EP and was deemed to be not a candidate for ablation due to obesity.  - Would reassess ablation vs cardioversion if she could lose some weight per Dr. Aundra Dubin - Continue Eliquis.  - At last visit, good rate control on carvedilol  3. Obesity: Body mass index is 50.2 kg/m. - GLP1 copay is still high on her insurance. With her high Eliquis copay, patient is not wanting to trial Wegovy at this time. May reconsider in the future.  4. PVCs: 19.2% PVCs on 10/2021 Zio. Zio 05/2022 showed PVC burden of 6.5%.  She is now off amiodarone.  -continue carvedilol  5. OSA: Continue CPAP.   6. CAD: CABG 2003.  No chest pain  - Not on ASA given Eliquis use.  - Continue atorvastatin.   7. 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  Mild right leg PAD on peripheral arterial dopplers in 02/2022.   Follow up 08/13/2022 in APP clinic  Maryan Puls, PharmD PGY-1 [facility] Pharmacy Resident  Audry Riles, PharmD, BCPS, [facility], CPP Heart Failure Clinic Pharmacist [phone]

## 2022-06-19 ENCOUNTER — Encounter: Payer: Self-pay | Admitting: Family Medicine

## 2022-06-21 ENCOUNTER — Ambulatory Visit: Payer: Medicare Other | Admitting: Cardiology

## 2022-07-11 DIAGNOSIS — G4733 Obstructive sleep apnea (adult) (pediatric): Secondary | ICD-10-CM | POA: Diagnosis not present

## 2022-07-14 ENCOUNTER — Encounter: Payer: Self-pay | Admitting: Cardiology

## 2022-07-14 ENCOUNTER — Encounter (HOSPITAL_COMMUNITY): Payer: Self-pay

## 2022-07-16 ENCOUNTER — Telehealth (HOSPITAL_COMMUNITY): Payer: Self-pay

## 2022-07-16 NOTE — Telephone Encounter (Signed)
Patient Advocate Encounter  Patient has contacted the office with concerns about the cost of Eliquis increasing in June/July due to the donut hole. Spoke with patient by phone, she confirmed that she does have enough supply to last until the end of June at this time.  Reviewed prior application for Eliquis assistance and discussed 3% of income criteria. Patient will check with the pharmacy to obtain a printout of the current expense report to see if an application can be sent in before this supply runs out. Patient will contact me directly if assistance can be started, or if supply runs low enough that samples will need to be provided.  Burnell Blanks, CPhT Rx Patient Advocate Phone: [phone]

## 2022-07-21 NOTE — Telephone Encounter (Signed)
Patient Advocate Encounter  Spoke with patient by phone. The OOP records she has sent are not enough to meet the BMS criteria for Eliquis at this time, but she has confirmed that she has enough medication to last into July. Patient will contact me when she is running low so that we can reassess the situation. Nothing further needed at this time.

## 2022-07-28 ENCOUNTER — Encounter: Payer: Self-pay | Admitting: Family Medicine

## 2022-07-28 ENCOUNTER — Telehealth: Payer: Self-pay | Admitting: Family Medicine

## 2022-07-28 NOTE — Telephone Encounter (Signed)
Pt called [facility] about this today as well - advised her that since she does not have a diagnosis of type 2 diabetes, she does not qualify for Ozempic pt assistance. I previously got an authorization approved for [facility] using new CV indication but the copay was cost prohibitive ~$200/month. [facility] does not have pt assistance available. Pt aware there are not other avenues to lower GLP copay.

## 2022-07-28 NOTE — Telephone Encounter (Signed)
Patient is needing help getting signed up for patient assistance for Ozempic, said she did this for Eliquis already. Wants to talk to someone about this to see what she needs to do. Please call back.

## 2022-08-10 DIAGNOSIS — G4733 Obstructive sleep apnea (adult) (pediatric): Secondary | ICD-10-CM | POA: Diagnosis not present

## 2022-08-13 ENCOUNTER — Encounter (HOSPITAL_COMMUNITY): Payer: Medicare Other

## 2022-08-18 ENCOUNTER — Other Ambulatory Visit: Payer: Self-pay | Admitting: Cardiology

## 2022-08-18 DIAGNOSIS — I4819 Other persistent atrial fibrillation: Secondary | ICD-10-CM

## 2022-08-18 NOTE — Telephone Encounter (Signed)
Prescription refill request for Eliquis received. Indication: AF Last office visit: 05/14/22  [place] [address] MD Scr: 1.25 on 06/09/22  Epic Age: 68 Weight: 142.4kg  Based on above findings Eliquis 5mg  twice daily is the appropriate dose.  Refill approved.

## 2022-08-20 ENCOUNTER — Other Ambulatory Visit: Payer: Self-pay | Admitting: Cardiology

## 2022-08-25 ENCOUNTER — Encounter: Payer: Self-pay | Admitting: Nurse Practitioner

## 2022-08-25 ENCOUNTER — Ambulatory Visit (INDEPENDENT_AMBULATORY_CARE_PROVIDER_SITE_OTHER): Payer: Medicare Other | Admitting: Nurse Practitioner

## 2022-08-25 VITALS — BP 116/75 | HR 60 | Temp 97.7°F | Ht 67.0 in | Wt 323.6 lb

## 2022-08-25 DIAGNOSIS — M545 Low back pain, unspecified: Secondary | ICD-10-CM | POA: Diagnosis not present

## 2022-08-25 MED ORDER — TIZANIDINE HCL 2 MG PO CAPS
2.0000 mg | ORAL_CAPSULE | Freq: Three times a day (TID) | ORAL | 0 refills | Status: DC | PRN
Start: 2022-08-25 — End: 2022-09-22

## 2022-08-25 MED ORDER — KETOROLAC TROMETHAMINE 30 MG/ML IJ SOLN
30.0000 mg | Freq: Once | INTRAMUSCULAR | Status: AC
Start: 2022-08-25 — End: 2022-08-25
  Administered 2022-08-25: 30 mg via INTRAMUSCULAR

## 2022-08-25 NOTE — Progress Notes (Addendum)
    Acute Office Visit  Subjective:     Patient ID: Julia West, female    DOB: 1956/02/17, 67 y.o.   MRN: [id number]  Chief Complaint  Patient presents with   middle back pain    Thinks it is a pulled muscle started hurting about a week ago. Alternating heat and ice. Pain will go away and then come back.     HPI Julia West is a 67 y.o. female who complains of an injury causing low back pain 1 week(s) ago. The pain is positional with bending or lifting, without radiation down the legs. Mechanism of injury: Wile in bed it was a small queen bed and we normally sed a king. Symptoms have been intermittent since that time. Prior history of back problems: recurrent self limited episodes of low back pain in the past. There is no numbness in the legs, los of bowel or bladder. Has been taking OTC tylenol with minimal relief  ROS Negative unless indicated in HPI    Objective:    BP 116/75   Pulse 60   Temp 97.7 F (36.5 C) (Temporal)   Ht 5\' 7"  (1.702 m)   Wt (!) 323 lb 9.6 oz (146.8 kg)   SpO2 91%   BMI 50.68 kg/m  BP Readings from Last 3 Encounters:  08/25/22 116/75  06/17/22 110/72  06/16/22 126/71   Wt Readings from Last 3 Encounters:  08/25/22 (!) 323 lb 9.6 oz (146.8 kg)  06/17/22 (!) 320 lb (145.2 kg)  06/16/22 (!) 320 lb 9.6 oz (145.4 kg)      Physical Exam OBJECTIVE: Vital signs as noted above. Patient appears to be in mild to moderate pain, has left prostatic left and on crutches  antalgic gait noted. Lumbosacral spine area reveals no local tenderness or mass. Painful and reduced LS ROM noted. Straight leg raise is positive at 45 degrees on left.  Peripheral pulses are palpable. Lumbar spine X-Ray: not indicated.  No results found for any visits on 08/25/22.      Assessment & Plan:  Acute midline low back pain without sciatica -     Ketorolac Tromethamine -     tiZANidine HCl; Take 1 capsule (2 mg total) by mouth 3 (three) times daily as needed for  muscle spasms.  Dispense: 20 capsule; Refill: 0  ASSESSMENT:  lumbar strain  PLAN: Toradol 30 ml IM injection administered Zanaflex 2 mg 1-tab TID PRN For acute pain, rest, intermittent application of cold packs (later, may switch to heat, but do not sleep on heating pad) Discussed longer term treatment plan of prn NSAID's and discussed a home back care exercise program with flexion exercise routine.  Proper lifting with avoidance of heavy lifting discussed.  Consider Physical Therapy and XRay studies if not improving. Call or return to clinic prn if these symptoms worsen or fail to improve as anticipated.   Return if symptoms worsen or fail to improve.  [address] Santa Lighter DNP

## 2022-08-27 ENCOUNTER — Other Ambulatory Visit (HOSPITAL_BASED_OUTPATIENT_CLINIC_OR_DEPARTMENT_OTHER): Payer: Self-pay

## 2022-08-27 ENCOUNTER — Other Ambulatory Visit: Payer: Self-pay

## 2022-08-27 ENCOUNTER — Emergency Department (HOSPITAL_BASED_OUTPATIENT_CLINIC_OR_DEPARTMENT_OTHER)
Admission: EM | Admit: 2022-08-27 | Discharge: 2022-08-27 | Disposition: A | Discharge status: Home / Self Care | Payer: Medicare Other | Attending: Emergency Medicine | Admitting: Emergency Medicine

## 2022-08-27 ENCOUNTER — Encounter (HOSPITAL_BASED_OUTPATIENT_CLINIC_OR_DEPARTMENT_OTHER): Payer: Self-pay | Admitting: Emergency Medicine

## 2022-08-27 ENCOUNTER — Emergency Department (HOSPITAL_BASED_OUTPATIENT_CLINIC_OR_DEPARTMENT_OTHER): Payer: Medicare Other

## 2022-08-27 DIAGNOSIS — K760 Fatty (change of) liver, not elsewhere classified: Secondary | ICD-10-CM | POA: Diagnosis not present

## 2022-08-27 DIAGNOSIS — I2510 Atherosclerotic heart disease of native coronary artery without angina pectoris: Secondary | ICD-10-CM | POA: Insufficient documentation

## 2022-08-27 DIAGNOSIS — I509 Heart failure, unspecified: Secondary | ICD-10-CM | POA: Insufficient documentation

## 2022-08-27 DIAGNOSIS — I110 Hypertensive heart disease with heart failure: Secondary | ICD-10-CM | POA: Insufficient documentation

## 2022-08-27 DIAGNOSIS — Z79899 Other long term (current) drug therapy: Secondary | ICD-10-CM | POA: Diagnosis not present

## 2022-08-27 DIAGNOSIS — K5730 Diverticulosis of large intestine without perforation or abscess without bleeding: Secondary | ICD-10-CM | POA: Diagnosis not present

## 2022-08-27 DIAGNOSIS — M8580 Other specified disorders of bone density and structure, unspecified site: Secondary | ICD-10-CM | POA: Insufficient documentation

## 2022-08-27 DIAGNOSIS — D72829 Elevated white blood cell count, unspecified: Secondary | ICD-10-CM | POA: Diagnosis not present

## 2022-08-27 DIAGNOSIS — R160 Hepatomegaly, not elsewhere classified: Secondary | ICD-10-CM | POA: Diagnosis not present

## 2022-08-27 DIAGNOSIS — Z85828 Personal history of other malignant neoplasm of skin: Secondary | ICD-10-CM | POA: Insufficient documentation

## 2022-08-27 DIAGNOSIS — I10 Essential (primary) hypertension: Secondary | ICD-10-CM | POA: Diagnosis not present

## 2022-08-27 DIAGNOSIS — M546 Pain in thoracic spine: Secondary | ICD-10-CM | POA: Diagnosis not present

## 2022-08-27 DIAGNOSIS — Z7901 Long term (current) use of anticoagulants: Secondary | ICD-10-CM | POA: Insufficient documentation

## 2022-08-27 DIAGNOSIS — E119 Type 2 diabetes mellitus without complications: Secondary | ICD-10-CM | POA: Diagnosis not present

## 2022-08-27 DIAGNOSIS — K429 Umbilical hernia without obstruction or gangrene: Secondary | ICD-10-CM | POA: Diagnosis not present

## 2022-08-27 DIAGNOSIS — M48061 Spinal stenosis, lumbar region without neurogenic claudication: Secondary | ICD-10-CM | POA: Diagnosis not present

## 2022-08-27 DIAGNOSIS — I700 Atherosclerosis of aorta: Secondary | ICD-10-CM | POA: Insufficient documentation

## 2022-08-27 LAB — URINALYSIS, ROUTINE W REFLEX MICROSCOPIC
Bilirubin Urine: NEGATIVE
Glucose, UA: >1000 mg/dL — AB
Hgb urine dipstick: NEGATIVE
Ketones, ur: NEGATIVE mg/dL
Nitrite: NEGATIVE
Specific Gravity, Urine: 1.027 (ref 1.005–1.030)
pH: 5.5 (ref 5.0–8.0)

## 2022-08-27 LAB — CBC WITH DIFFERENTIAL/PLATELET
Abs Immature Granulocytes: 0.04 10*3/uL (ref 0.00–0.07)
Basophils Absolute: 0.1 10*3/uL (ref 0.0–0.1)
Basophils Relative: 0 %
Eosinophils Absolute: 0.4 10*3/uL (ref 0.0–0.5)
Eosinophils Relative: 3 %
HCT: 45 % (ref 36.0–46.0)
Hemoglobin: 14.4 g/dL (ref 12.0–15.0)
Immature Granulocytes: 0 %
Lymphocytes Relative: 51 %
Lymphs Abs: 6.8 10*3/uL — ABNORMAL HIGH (ref 0.7–4.0)
MCH: 29 pg (ref 26.0–34.0)
MCHC: 32 g/dL (ref 30.0–36.0)
MCV: 90.5 fL (ref 80.0–100.0)
Monocytes Absolute: 0.8 10*3/uL (ref 0.1–1.0)
Monocytes Relative: 6 %
Neutro Abs: 5.4 10*3/uL (ref 1.7–7.7)
Neutrophils Relative %: 40 %
Platelets: 240 10*3/uL (ref 150–400)
RBC: 4.97 MIL/uL (ref 3.87–5.11)
RDW: 16.8 % — ABNORMAL HIGH (ref 11.5–15.5)
WBC: 13.4 10*3/uL — ABNORMAL HIGH (ref 4.0–10.5)
nRBC: 0 % (ref 0.0–0.2)

## 2022-08-27 LAB — COMPREHENSIVE METABOLIC PANEL
ALT: 9 U/L (ref 0–44)
AST: 10 U/L — ABNORMAL LOW (ref 15–41)
Albumin: 3.9 g/dL (ref 3.5–5.0)
Alkaline Phosphatase: 40 U/L (ref 38–126)
Anion gap: 8 (ref 5–15)
BUN: 26 mg/dL — ABNORMAL HIGH (ref 8–23)
CO2: 25 mmol/L (ref 22–32)
Calcium: 8.8 mg/dL — ABNORMAL LOW (ref 8.9–10.3)
Chloride: 106 mmol/L (ref 98–111)
Creatinine, Ser: 1.02 mg/dL — ABNORMAL HIGH (ref 0.44–1.00)
GFR, Estimated: 60 mL/min — ABNORMAL LOW (ref >60)
Glucose, Bld: 79 mg/dL (ref 70–99)
Potassium: 4.4 mmol/L (ref 3.5–5.1)
Sodium: 139 mmol/L (ref 135–145)
Total Bilirubin: 0.6 mg/dL (ref 0.3–1.2)
Total Protein: 6.5 g/dL (ref 6.5–8.1)

## 2022-08-27 MED ORDER — MORPHINE SULFATE (PF) 4 MG/ML IV SOLN
4.0000 mg | Freq: Once | INTRAVENOUS | Status: AC
  Administered 2022-08-27: 4 mg via INTRAVENOUS
  Filled 2022-08-27: qty 1

## 2022-08-27 MED ORDER — SODIUM CHLORIDE 0.9 % IV BOLUS
500.0000 mL | Freq: Once | INTRAVENOUS | Status: AC
  Administered 2022-08-27: 500 mL via INTRAVENOUS

## 2022-08-27 MED ORDER — ONDANSETRON HCL 4 MG/2ML IJ SOLN
4.0000 mg | Freq: Once | INTRAMUSCULAR | Status: AC
  Administered 2022-08-27: 4 mg via INTRAVENOUS
  Filled 2022-08-27: qty 2

## 2022-08-27 MED ORDER — HYDROCODONE-ACETAMINOPHEN 5-325 MG PO TABS
1.0000 | ORAL_TABLET | ORAL | 0 refills | Status: DC | PRN
  Filled 2022-08-27: qty 10, 2d supply, fill #0

## 2022-08-27 NOTE — ED Provider Notes (Signed)
[place] EMERGENCY DEPARTMENT AT [facility] Provider Note   CSN: [id number] Arrival date & time: 08/27/22  0701     History  Chief Complaint  Patient presents with   Back Pain    Julia West is a 67 y.o. female.  Pt is a 67 yo female with pmhx significant for obesity, djd, htn, hld, cad, skin cancer, sleep apnea, RA, afib (on Eliquis), CHF, DM2, and s/p left BKA.  Pt has been having left flank pain for the past few days.  She did go to her PCP office on 6/12 and was given a shot of toradol and a rx for tizanidine.  Pt said the toradol did help, but it wore off the pain came back last night and the tizanidine did not help.  She denies any pain going down her legs.  No rash.  No hematuria or dysuria.  No hx of this kind of pain in the past.       Home Medications Prior to Admission medications   Medication Sig Start Date End Date Taking? Authorizing Provider  HYDROcodone-acetaminophen (NORCO/VICODIN) 5-325 MG tablet Take 1 tablet by mouth every 4 (four) hours as needed. 08/27/22  Yes Jacalyn Lefevre, MD  amoxicillin-clavulanate (AUGMENTIN) 875-125 MG tablet Take 1 tablet by mouth 2 (two) times daily. Take all of this medication Patient not taking: Reported on 08/25/2022 06/16/22   Mechele Claude, MD  apixaban (ELIQUIS) 5 MG TABS tablet TAKE 1 TABLET BY MOUTH TWICE  DAILY 08/18/22   Rollene Rotunda, MD  atorvastatin (LIPITOR) 20 MG tablet Take 1 tablet (20 mg total) by mouth daily. 06/16/22   Mechele Claude, MD  carvedilol (COREG) 12.5 MG tablet Take 1 tablet (12.5 mg total) by mouth 2 (two) times daily. 06/14/22   Laurey Morale, MD  cetirizine (ZYRTEC) 10 MG tablet Take 1 tablet (10 mg total) by mouth daily as needed for allergies. 06/16/22   Mechele Claude, MD  dapagliflozin propanediol (FARXIGA) 10 MG TABS tablet Take 1 tablet (10 mg total) by mouth daily before breakfast. 04/05/22   Laurey Morale, MD  fenofibrate 160 MG tablet Take 1 tablet (160 mg total) by mouth  daily. 06/16/22   Mechele Claude, MD  fluticasone (FLONASE) 50 MCG/ACT nasal spray Place 1 spray into both nostrils daily as needed for allergies or rhinitis. 06/16/22   Mechele Claude, MD  furosemide (LASIX) 20 MG tablet TAKE 1 TABLET BY MOUTH EVERY DAY AS NEEDED 08/20/22   Rollene Rotunda, MD  nitroGLYCERIN (NITROSTAT) 0.4 MG SL tablet Place 1 tablet (0.4 mg total) under the tongue every 5 (five) minutes x 3 doses as needed for chest pain. If pain persist after call 911 11/11/21   Rollene Rotunda, MD  sacubitril-valsartan (ENTRESTO) 97-103 MG Take 1 tablet by mouth 2 (two) times daily. 06/17/22   Laurey Morale, MD  sertraline (ZOLOFT) 50 MG tablet Take 1 tablet (50 mg total) by mouth daily. 06/16/22   Mechele Claude, MD  sodium chloride (OCEAN) 0.65 % SOLN nasal spray Place 2 sprays into both nostrils daily. As needed 06/16/22   Mechele Claude, MD  spironolactone (ALDACTONE) 25 MG tablet Take 1 tablet (25 mg total) by mouth daily. 05/14/22   Laurey Morale, MD  tizanidine (ZANAFLEX) 2 MG capsule Take 1 capsule (2 mg total) by mouth 3 (three) times daily as needed for muscle spasms. 08/25/22   St Vena Austria, NP  WEGOVY 0.25 MG/0.5ML SOAJ Inject 0.25 mg into the skin once a week.  Patient not taking: Reported on 06/17/2022 06/14/22   Laurey Morale, MD      Allergies    Ace inhibitors    Review of Systems   Review of Systems  Musculoskeletal:  Positive for back pain.  All other systems reviewed and are negative.   Physical Exam Updated Vital Signs BP 128/72   Pulse 85   Temp 97.7 F (36.5 C) (Oral)   Resp 18   SpO2 96%  Physical Exam Vitals and nursing note reviewed.  Constitutional:      Appearance: Normal appearance. She is obese.  HENT:     Head: Normocephalic and atraumatic.     Right Ear: External ear normal.     Left Ear: External ear normal.     Nose: Nose normal.     Mouth/Throat:     Mouth: Mucous membranes are moist.     Pharynx: Oropharynx is clear.  Eyes:      Extraocular Movements: Extraocular movements intact.     Conjunctiva/sclera: Conjunctivae normal.     Pupils: Pupils are equal, round, and reactive to light.  Cardiovascular:     Rate and Rhythm: Normal rate. Rhythm irregular.     Pulses: Normal pulses.     Heart sounds: Normal heart sounds.  Pulmonary:     Effort: Pulmonary effort is normal.     Breath sounds: Normal breath sounds.  Abdominal:     General: Abdomen is flat. Bowel sounds are normal.     Palpations: Abdomen is soft.  Musculoskeletal:        General: Normal range of motion.       Arms:     Cervical back: Normal range of motion and neck supple.  Skin:    General: Skin is warm.     Capillary Refill: Capillary refill takes less than 2 seconds.     Findings: No rash.  Neurological:     General: No focal deficit present.     Mental Status: She is alert and oriented to person, place, and time.  Psychiatric:        Mood and Affect: Mood normal.        Behavior: Behavior normal.     ED Results / Procedures / Treatments   Labs (all labs ordered are listed, but only abnormal results are displayed) Labs Reviewed  COMPREHENSIVE METABOLIC PANEL - Abnormal; Notable for the following components:      Result Value   BUN 26 (*)    Creatinine, Ser 1.02 (*)    Calcium 8.8 (*)    AST 10 (*)    GFR, Estimated 60 (*)    All other components within normal limits  CBC WITH DIFFERENTIAL/PLATELET - Abnormal; Notable for the following components:   WBC 13.4 (*)    RDW 16.8 (*)    Lymphs Abs 6.8 (*)    All other components within normal limits  URINALYSIS, ROUTINE W REFLEX MICROSCOPIC - Abnormal; Notable for the following components:   APPearance HAZY (*)    Glucose, UA >1,000 (*)    Protein, ur TRACE (*)    Leukocytes,Ua SMALL (*)    Bacteria, UA RARE (*)    All other components within normal limits    EKG None  Radiology CT Renal Stone Study  Result Date: 08/27/2022 CLINICAL DATA:  Left flank pain. EXAM: CT ABDOMEN  AND PELVIS WITHOUT CONTRAST TECHNIQUE: Multidetector CT imaging of the abdomen and pelvis was performed following the standard protocol without IV contrast. RADIATION DOSE REDUCTION:  This exam was performed according to the departmental dose-optimization program which includes automated exposure control, adjustment of the mA and/or kV according to patient size and/or use of iterative reconstruction technique. COMPARISON:  CT with IV contrast 02/07/2012. FINDINGS: Lower chest: There is mild cardiomegaly. Calcification in the right coronary artery. No pericardial effusion. Lung bases are clear. Hepatobiliary: The liver is 22 cm length, demonstrating mild steatosis. Portions of the hepatic undersurface show slight lobulation which may suggest early cirrhosis. No masses seen without contrast. There is a tiny hypodensity in the dome of segment 4A, which is probably a very small cyst but was not seen previously and is too small to characterize. Cholecystectomy changes again noted without biliary dilatation. Pancreas: Fatty atrophic changes are again noted in the tail section. No focal abnormality without contrast. Spleen: Unremarkable without contrast. Adrenals/Urinary Tract: No adrenal mass. There is a Bosniak 1 homogeneous thin walled cyst in the superior pole of the left kidney measuring 9.5 cm, with Hounsfield density of -9. No follow-up imaging is needed. This is larger than previously having previously measured 6 cm. The unenhanced kidneys are otherwise unremarkable. There is no urinary stone or obstruction. There is asymmetric left perinephric stranding which could be senescent in etiology or inflammatory in nature. Correlate with urinalysis. The bladder is contracted and not well seen. Stomach/Bowel: No dilatation or wall or wall thickening. An appendix is not seen in this patient. There are colonic diverticula, most numerous in the sigmoid. There are faint stranding changes along side the mid third of the sigmoid  segment, which could indicate fat scarring from prior diverticulitis or a mild acute diverticulitis. Rectus diastasis and a wide mouth umbilical fat hernia are again noted. Rectus diastasis continues into the low anterior pelvic wall with a wide mouth infraumbilical hernia containing nonobstructed small bowel loops. Vascular/Lymphatic: Aortic atherosclerosis. No enlarged abdominal or pelvic lymph nodes. Reproductive: Uterus and bilateral adnexa are unremarkable. Other: There is no free air, free fluid or free hemorrhage. No incarcerated hernia. Musculoskeletal: Osteopenia and degenerative change thoracic and lumbar spine. Mild grade 1 degenerative anterolisthesis with facet hypertrophy also noted at L3-4 and L4-5 where there is acquired spinal stenosis. There is no spinal compression fracture. No aggressive osseous lesions. IMPRESSION: 1. Asymmetric left perinephric stranding which could be senescent or inflammatory in nature. Correlate with urinalysis. No urinary stone or obstruction is seen. 2. Diverticulosis with faint stranding changes along the mid third of the sigmoid segment which could be due to fat scarring from prior diverticulitis or a mild acute diverticulitis. 3. Rectus diastasis and wide mouth infraumbilical hernia containing nonobstructed small bowel loops. 4. Umbilical fat hernia. 5. Cardiomegaly with aortic and coronary artery atherosclerosis. 6. Mildly enlarged liver with mild steatosis and slight lobulation of the undersurface which may suggest early cirrhosis. 7. Tiny hypodensity in the dome of segment 4A is probably a very small cyst but was not seen previously and is too small to characterize. 8. Osteopenia and degenerative change. Acquired spinal stenosis at L3-4 and L4-5. Aortic Atherosclerosis (ICD10-I70.0). Electronically Signed   By: Almira Bar M.D.   On: 08/27/2022 08:05    Procedures Procedures    Medications Ordered in ED Medications  sodium chloride 0.9 % bolus 500 mL (0  mLs Intravenous Stopped 08/27/22 0809)  morphine (PF) 4 MG/ML injection 4 mg (4 mg Intravenous Given 08/27/22 0749)  ondansetron (ZOFRAN) injection 4 mg (4 mg Intravenous Given 08/27/22 0960)    ED Course/ Medical Decision Making/ A&P  Medical Decision Making Amount and/or Complexity of Data Reviewed Labs: ordered. Radiology: ordered.  Risk Prescription drug management.   This patient presents to the ED for concern of back pain, this involves an extensive number of treatment options, and is a complaint that carries with it a high risk of complications and morbidity.  The differential diagnosis includes msk, kidney stone, pyelo, shingles, aorta   Co morbidities that complicate the patient evaluation  obesity, djd, htn, hld, cad, skin cancer, sleep apnea, RA, afib (on Eliquis), CHF, DM2, and s/p left BKA   Additional history obtained:  Additional history obtained from epic chart review External records from outside source obtained and reviewed including daughter   Lab Tests:  I Ordered, and personally interpreted labs.  The pertinent results include:  cbc with wbc elevated at 13.4; cmp with cr elevated at 1.02 (chronic);ua neg   Imaging Studies ordered:  I ordered imaging studies including ct renal  I independently visualized and interpreted imaging which showed   Asymmetric left perinephric stranding which could be senescent or  inflammatory in nature. Correlate with urinalysis. No urinary stone  or obstruction is seen.  2. Diverticulosis with faint stranding changes along the mid third  of the sigmoid segment which could be due to fat scarring from prior  diverticulitis or a mild acute diverticulitis.  3. Rectus diastasis and wide mouth infraumbilical hernia containing  nonobstructed small bowel loops.  4. Umbilical fat hernia.  5. Cardiomegaly with aortic and coronary artery atherosclerosis.  6. Mildly enlarged liver with mild steatosis and  slight lobulation  of the undersurface which may suggest early cirrhosis.  7. Tiny hypodensity in the dome of segment 4A is probably a very  small cyst but was not seen previously and is too small to  characterize.  8. Osteopenia and degenerative change. Acquired spinal stenosis at  L3-4 and L4-5.    Aortic Atherosclerosis (ICD10-I70.0).   I agree with the radiologist interpretation   Cardiac Monitoring:  The patient was maintained on a cardiac monitor.  I personally viewed and interpreted the cardiac monitored which showed an underlying rhythm of: afib   Medicines ordered and prescription drug management:  I ordered medication including ivfs, morphine, zofran  for sx  Reevaluation of the patient after these medicines showed that the patient improved I have reviewed the patients home medicines and have made adjustments as needed   Test Considered:  ct   Critical Interventions:  Pain control  Problem List / ED Course:  Back pain:  ? Passed stone.  Pt does have some mild dilation to left kidney.  No uti.  Pt is stable for d/c.  Return if worse.    Reevaluation:  After the interventions noted above, I reevaluated the patient and found that they have :improved   Social Determinants of Health:  Lives at home   Dispostion:  After consideration of the diagnostic results and the patients response to treatment, I feel that the patent would benefit from discharge with outpatient f/u.          Final Clinical Impression(s) / ED Diagnoses Final diagnoses:  Acute left-sided thoracic back pain    Rx / DC Orders ED Discharge Orders          Ordered    HYDROcodone-acetaminophen (NORCO/VICODIN) 5-325 MG tablet  Every 4 hours PRN        08/27/22 1610              Jacalyn Lefevre, MD 08/27/22 0932

## 2022-08-27 NOTE — ED Triage Notes (Signed)
Pt here from home with c/o left side back pain , was seen at the office on Wednesday and was given a shot and muscle relaxer with minimal relief

## 2022-08-29 ENCOUNTER — Encounter (HOSPITAL_COMMUNITY): Payer: Self-pay

## 2022-09-07 NOTE — Progress Notes (Signed)
Primary Care: Mechele Claude, MD HF Cardiology: Dr Shirlee Latch  HPI: Referred to clinic initially by Dr Ella Jubilee for heart failure consultation.   Ms Parrillo is a 67 y.o. with a history of PAF,  HTN, CAD, CABG  2003, hyperlipidemia, LBKA, lymphedema RLE/RUE, and chronic HFrEF.   Admitted in 7/23 with atrial fibrillation/RVR and acute systolic CHF. Diuresed with IV lasix. Echo showed newly reduced EF 30-35%. Had DC-CV 09/22/21 with restoration of SR. Plan to f/u with Dr Anselm Jungling for ablation. Started on SGLT2i and MRA. Discharged on BB and ARB. Discharge weight 305 pounds.   Zio in 8/23 showed 19.2% PVCs.  She was transitioned from Tikosyn to amiodarone.  Atrial fibrillation was persistent, she failed DCCV in 10/23.  She saw Dr. Elberta Fortis, decided against ablation due to obesity.  Amiodarone was stopped. She has been in rate-controlled AF since that time.    She returns for followup of CHF. She is using CPAP every night.  She has a prosthetic left lower leg.  She uses crutches for balance when she walks.  She has occasional chest "pinching" lasting for several seconds, nothing prolonged or exertional. She uses CPAP, no orthopnea/PND.  Tolerating meds without lightheadedness.  Weight up 4 lbs.  She has ongoing exertional dyspnea walking 100-200 feet, this has not changed.  She is no longer smoking. No BRBPR/melena.  She takes Lasix once or twice a week.   Labs (8/23): LDL 64 Labs (10/23): K 5, creatinine 1.17 Labs (12/23): K 4.8, creatinine 1.23  ECG (personally reviewed): atrial fibrillation, LAFB, septal Qs, PACs  REDs clip 37%  PMH: 1. CAD: CABG x 2 in 2003 with LIMA-LAD, free RIMA-LCX 2. Rheumatoid arthritis 3. Left Charcot foot with left BKA.  - Peripheral arterial dopplers (12/23) with mild right leg PAD.  4. HTN 5. Prior smoker 6. Atrial fibrillation: Chronic.  Has failed Tikosyn, amiodarone, and DCCV.  7. PVCs: Zio (8/23) with 19.2% PVCs.  8. OSA: CPAP.  9. Chronic systolic CHF:  ?Ischemic cardiomyopathy versus tachycardia-mediated.   - Echo 4/22: EF 50-55% - Echo 7/23: EF 30-35% RV normal  - Echo 11/23: EF 40-45%, mild LV dilation, moderate RV enlargement, moderately decreased RV systolic function, mild MR, IVC normal.  10. Hyperlipidemia   Review of Systems:  All systems reviewed and negative except as per HPI.     Current Outpatient Medications  Medication Sig Dispense Refill   amoxicillin-clavulanate (AUGMENTIN) 875-125 MG tablet Take 1 tablet by mouth 2 (two) times daily. Take all of this medication (Patient not taking: Reported on 08/25/2022) 20 tablet 0   apixaban (ELIQUIS) 5 MG TABS tablet TAKE 1 TABLET BY MOUTH TWICE  DAILY 200 tablet 1   atorvastatin (LIPITOR) 20 MG tablet Take 1 tablet (20 mg total) by mouth daily. 100 tablet 1   carvedilol (COREG) 12.5 MG tablet Take 1 tablet (12.5 mg total) by mouth 2 (two) times daily. 90 tablet 3   cetirizine (ZYRTEC) 10 MG tablet Take 1 tablet (10 mg total) by mouth daily as needed for allergies. 90 tablet 3   dapagliflozin propanediol (FARXIGA) 10 MG TABS tablet Take 1 tablet (10 mg total) by mouth daily before breakfast. 90 tablet 3   fenofibrate 160 MG tablet Take 1 tablet (160 mg total) by mouth daily. 100 tablet 3   fluticasone (FLONASE) 50 MCG/ACT nasal spray Place 1 spray into both nostrils daily as needed for allergies or rhinitis. 47.4 mL 3   furosemide (LASIX) 20 MG tablet TAKE 1 TABLET  BY MOUTH EVERY DAY AS NEEDED 90 tablet 3   HYDROcodone-acetaminophen (NORCO/VICODIN) 5-325 MG tablet Take 1 tablet by mouth every 4 (four) hours as needed. 10 tablet 0   nitroGLYCERIN (NITROSTAT) 0.4 MG SL tablet Place 1 tablet (0.4 mg total) under the tongue every 5 (five) minutes x 3 doses as needed for chest pain. If pain persist after call 911 25 tablet 4   sacubitril-valsartan (ENTRESTO) 97-103 MG Take 1 tablet by mouth 2 (two) times daily. 180 tablet 3   sertraline (ZOLOFT) 50 MG tablet Take 1 tablet (50 mg total) by  mouth daily. 100 tablet 2   sodium chloride (OCEAN) 0.65 % SOLN nasal spray Place 2 sprays into both nostrils daily. As needed     spironolactone (ALDACTONE) 25 MG tablet Take 1 tablet (25 mg total) by mouth daily. 90 tablet 3   tizanidine (ZANAFLEX) 2 MG capsule Take 1 capsule (2 mg total) by mouth 3 (three) times daily as needed for muscle spasms. 20 capsule 0   WEGOVY 0.25 MG/0.5ML SOAJ Inject 0.25 mg into the skin once a week. (Patient not taking: Reported on 06/17/2022) 2 mL 0   No current facility-administered medications for this visit.    Allergies  Allergen Reactions   Ace Inhibitors Cough      Social History   Socioeconomic History   Marital status: Married    Spouse name: Renae Fickle   Number of children: 5   Years of education: Not on file   Highest education level: GED or equivalent  Occupational History   Occupation: Facilities manager    Comment: In the [employer]   Occupation: retired  Tobacco Use   Smoking status: Former    Packs/day: 1.00    Years: 25.00    Additional pack years: 0.00    Total pack years: 25.00    Types: Cigarettes    Quit date: 11/17/2011    Years since quitting: 10.8    Passive exposure: Current   Smokeless tobacco: Never  Vaping Use   Vaping Use: Never used  Substance and Sexual Activity   Alcohol use: No   Drug use: No   Sexual activity: Yes    Birth control/protection: Post-menopausal  Other Topics Concern   Not on file  Social History Narrative   Lives in [place] with her husband. 5 children. One of whom has pulmonary stenosis.    Left BKA, uses crutches - doesn't get much exercise   Social Determinants of Health   Financial Resource Strain: Low Risk  (06/14/2022)   Overall Financial Resource Strain (CARDIA)    Difficulty of Paying Living Expenses: Not hard at all  Food Insecurity: No Food Insecurity (06/14/2022)   Hunger Vital Sign    Worried About Running Out of Food in the Last Year: Never true    Ran Out of Food in the Last Year:  Never true  Transportation Needs: No Transportation Needs (06/14/2022)   PRAPARE - Administrator, Civil Service (Medical): No    Lack of Transportation (Non-Medical): No  Physical Activity: Inactive (06/14/2022)   Exercise Vital Sign    Days of Exercise per Week: 0 days    Minutes of Exercise per Session: 30 min  Stress: No Stress Concern Present (06/14/2022)   [employer] of Occupational Health - Occupational Stress Questionnaire    Feeling of Stress : Not at all  Social Connections: Moderately Isolated (06/14/2022)   Social Connection and Isolation Panel [NHANES]    Frequency of Communication with  Friends and Family: More than three times a week    Frequency of Social Gatherings with Friends and Family: Once a week    Attends Religious Services: Never    Database administrator or Organizations: No    Attends Banker Meetings: Never    Marital Status: Married  Catering manager Violence: Not At Risk (06/10/2022)   Humiliation, Afraid, Rape, and Kick questionnaire    Fear of Current or Ex-Partner: No    Emotionally Abused: No    Physically Abused: No    Sexually Abused: No      Family History  Problem Relation Age of Onset   Heart attack Mother    Hypertension Mother    Heart disease Mother    Sudden death Mother    Obesity Mother    Hypertension Sister    Diabetes Brother    Birth defects Son        heart defect    There were no vitals filed for this visit.  Wt Readings from Last 3 Encounters:  08/25/22 (!) 146.8 kg (323 lb 9.6 oz)  06/17/22 (!) 145.2 kg (320 lb)  06/16/22 (!) 145.4 kg (320 lb 9.6 oz)   PHYSICAL EXAM: General: NAD Neck: JVP 8 cm, no thyromegaly or thyroid nodule.  Lungs: Clear to auscultation bilaterally with normal respiratory effort. CV: Nondisplaced PMI.  Heart irregular S1/S2, no S3/S4, no murmur.  No peripheral edema.  No carotid bruit.  Unable to palpate pedal pulses.  Abdomen: Soft, nontender, no hepatosplenomegaly,  no distention.  Skin: Intact without lesions or rashes.  Neurologic: Alert and oriented x 3.  Psych: Normal affect. Extremities: No clubbing or cyanosis. Left BKA.  HEENT: Normal.   ASSESSMENT & PLAN: 1. Chronic systolic CHF:  Echo 09/2021 down to 30-35% from previous 50-55% back 2022.  Patient was in atrial fibrillation with RVR, ?tachy-mediated cardiomyopathy.  Echo in 11/23 with controlled rate (still in atrial fibrillation) showed EF still low at 40-45%, mild LV dilation, moderate RV dilation and moderately decreased RV systolic function.  Also possible that PVCs play a role as well as CAD (history of CABG).  NYHA class III symptoms, stable.  Weight is up 4 lbs.  Mild volume overload by exam and REDS clip.    - I would like her to start Lasix 20 mg every other day.  BMET/BNP today and BMET in 10 days.   - Farxiga 10 mg daily.  - Continue carvedilol 6.25 mg twice a day  - Continue Entresto 49/51 bid.   - Increase spironolactone to 25 mg daily.  - Her insurance refused RHC/LHC after last appointment (fall in EF, known CAD).  Will hold off given stable symptoms currently and treat medically for now.  - Zio monitor x 7 days to quantify PVCs (?contributing to cardiomyopathy).  If significant percentage, would restart amiodarone to suppress.  2. Atrial fibrillation: Now chronic.  She has failed amiodarone and Tikosyn as well as DCCV.  She saw EP and was deemed to be not a candidate for ablation due to obesity.  - Would reassess ablation vs cardioversion if she could lose some weight.  - Continue Eliquis.  - good rate control on Coreg.  3. Obesity: There is no height or weight on file to calculate BMI. - Will check HgbA1c today and refer to pharmacy clinic for semaglutide vs tirzepatide.  4. PVCs: 19.2% PVCs on 8/23 Zio.  She is now off amiodarone. Question if PVCs play a role in cardiomyopathy.  -  Repeat Zio monitor x 7 days to reassess PVCs, ?restart amiodarone.  5. OSA: Continue CPAP.  6.  CAD: CABG 2003.  No chest pain but has significant exertional dyspnea.  - Not on ASA given apixaban use.  - Continue atorvastatin.  Good lipids in 8/23.  7. 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  Mild right leg PAD on peripheral arterial dopplers in 12/23.   Pharmacy clinic 1 month for med titration, followup 3 months with APP.   [facility]  09/07/2022

## 2022-09-08 ENCOUNTER — Encounter (HOSPITAL_COMMUNITY): Payer: Self-pay

## 2022-09-08 ENCOUNTER — Telehealth: Payer: Self-pay | Admitting: Pharmacist

## 2022-09-08 ENCOUNTER — Other Ambulatory Visit (HOSPITAL_COMMUNITY): Payer: Self-pay

## 2022-09-08 ENCOUNTER — Ambulatory Visit (HOSPITAL_COMMUNITY)
Admission: RE | Admit: 2022-09-08 | Discharge: 2022-09-08 | Disposition: A | Discharge status: Home / Self Care | Payer: Medicare Other | Source: Ambulatory Visit | Attending: Family Medicine | Admitting: Family Medicine

## 2022-09-08 VITALS — BP 98/62 | HR 63 | Wt 326.6 lb

## 2022-09-08 DIAGNOSIS — I110 Hypertensive heart disease with heart failure: Secondary | ICD-10-CM | POA: Diagnosis not present

## 2022-09-08 DIAGNOSIS — I739 Peripheral vascular disease, unspecified: Secondary | ICD-10-CM

## 2022-09-08 DIAGNOSIS — G4733 Obstructive sleep apnea (adult) (pediatric): Secondary | ICD-10-CM | POA: Diagnosis not present

## 2022-09-08 DIAGNOSIS — I493 Ventricular premature depolarization: Secondary | ICD-10-CM | POA: Insufficient documentation

## 2022-09-08 DIAGNOSIS — I2510 Atherosclerotic heart disease of native coronary artery without angina pectoris: Secondary | ICD-10-CM | POA: Diagnosis not present

## 2022-09-08 DIAGNOSIS — Z951 Presence of aortocoronary bypass graft: Secondary | ICD-10-CM | POA: Diagnosis not present

## 2022-09-08 DIAGNOSIS — I480 Paroxysmal atrial fibrillation: Secondary | ICD-10-CM | POA: Diagnosis not present

## 2022-09-08 DIAGNOSIS — R000 Tachycardia, unspecified: Secondary | ICD-10-CM | POA: Diagnosis not present

## 2022-09-08 DIAGNOSIS — Z6841 Body Mass Index (BMI) 40.0 and over, adult: Secondary | ICD-10-CM | POA: Diagnosis not present

## 2022-09-08 DIAGNOSIS — Z89512 Acquired absence of left leg below knee: Secondary | ICD-10-CM | POA: Insufficient documentation

## 2022-09-08 DIAGNOSIS — E785 Hyperlipidemia, unspecified: Secondary | ICD-10-CM | POA: Diagnosis not present

## 2022-09-08 DIAGNOSIS — I482 Chronic atrial fibrillation, unspecified: Secondary | ICD-10-CM | POA: Diagnosis not present

## 2022-09-08 DIAGNOSIS — Z79899 Other long term (current) drug therapy: Secondary | ICD-10-CM | POA: Insufficient documentation

## 2022-09-08 DIAGNOSIS — Z7901 Long term (current) use of anticoagulants: Secondary | ICD-10-CM | POA: Insufficient documentation

## 2022-09-08 DIAGNOSIS — E669 Obesity, unspecified: Secondary | ICD-10-CM | POA: Insufficient documentation

## 2022-09-08 DIAGNOSIS — I890 Lymphedema, not elsewhere classified: Secondary | ICD-10-CM | POA: Insufficient documentation

## 2022-09-08 DIAGNOSIS — I5022 Chronic systolic (congestive) heart failure: Secondary | ICD-10-CM

## 2022-09-08 DIAGNOSIS — I5023 Acute on chronic systolic (congestive) heart failure: Secondary | ICD-10-CM | POA: Insufficient documentation

## 2022-09-08 LAB — BASIC METABOLIC PANEL
Anion gap: 8 (ref 5–15)
BUN: 25 mg/dL — ABNORMAL HIGH (ref 8–23)
CO2: 25 mmol/L (ref 22–32)
Calcium: 9 mg/dL (ref 8.9–10.3)
Chloride: 106 mmol/L (ref 98–111)
Creatinine, Ser: 1.13 mg/dL — ABNORMAL HIGH (ref 0.44–1.00)
GFR, Estimated: 53 mL/min — ABNORMAL LOW (ref >60)
Glucose, Bld: 100 mg/dL — ABNORMAL HIGH (ref 70–99)
Potassium: 5.1 mmol/L (ref 3.5–5.1)
Sodium: 139 mmol/L (ref 135–145)

## 2022-09-08 MED ORDER — WEGOVY 0.25 MG/0.5ML ~~LOC~~ SOAJ: 0.2500 mg | SUBCUTANEOUS | 0 refills | Status: DC

## 2022-09-08 MED ORDER — ENTRESTO 49-51 MG PO TABS: 1.0000 | ORAL_TABLET | Freq: Two times a day (BID) | ORAL | 3 refills | Status: DC

## 2022-09-08 NOTE — Telephone Encounter (Addendum)
Pt has Medicare which won't cover weight loss GLPs, no DM either. Will try submitting for [facility] under new CV indication, pt has hx of CABG. Received notice that PA is already approved on file through 03/15/23. Had tech run test claim to see what pt's copay would be - cost is $231/month, no copay assistance available.  Called pt to discuss, she would like to talk about it with her husband given the cost and will give me a call back if she decides she would like to start on Wegovy. At that time, would send in rx for starting dose and schedule PharmD appt.

## 2022-09-08 NOTE — Progress Notes (Signed)
Medication Samples have been provided to the patient.  Drug name: Eliquis       Strength: 5mg         Qty: 4  LOT: [id number]  Exp.Date: 12/2023  Dosing instructions: take 1 tab po bid  The patient has been instructed regarding the correct time, dose, and frequency of taking this medication, including desired effects and most common side effects.   Azka Steger R Garl Speigner 9:45 AM 09/08/2022

## 2022-09-08 NOTE — Telephone Encounter (Signed)
-----   Message from Jacklynn Ganong, [place] sent at 09/08/2022  9:25 AM EDT ----- Regarding: Polo Riley,  Question about this patient. Would she be able to qualify for Zepbound?  Thanks!

## 2022-09-08 NOTE — Patient Instructions (Signed)
RedsClip done today.  Labs done today. We will contact you only if your labs are abnormal.  DECREASE Entresto to 49-51mg  (1 tablet) by mouth 2 times daily.   No other medication changes were made. Please continue all current medications as prescribed.  Your physician recommends that you schedule a follow-up appointment in: 4 months. Please contact our office in August to schedule a October appointment.   If you have any questions or concerns before your next appointment please send [place] a message through [place] or call our office at [phone].    TO LEAVE A MESSAGE FOR THE NURSE SELECT OPTION 2, PLEASE LEAVE A MESSAGE INCLUDING: YOUR NAME DATE OF BIRTH CALL BACK NUMBER REASON FOR CALL**this is important as we prioritize the call backs  YOU WILL RECEIVE A CALL BACK THE SAME DAY AS LONG AS YOU CALL BEFORE 4:00 PM   Do the following things EVERYDAY: Weigh yourself in the morning before breakfast. Write it down and keep it in a log. Take your medicines as prescribed Eat low salt foods--Limit salt (sodium) to 2000 mg per day.  Stay as active as you can everyday Limit all fluids for the day to less than 2 liters   At the [facility], you and your health needs are our priority. As part of our continuing mission to provide you with exceptional heart care, we have created designated Provider Care Teams. These Care Teams include your primary Cardiologist (physician) and Advanced Practice Providers (APPs- Physician Assistants and Nurse Practitioners) who all work together to provide you with the care you need, when you need it.   You may see any of the following providers on your designated Care Team at your next follow up: Dr Arvilla Meres Dr Marca Ancona Dr. Marcos Eke, NP Robbie Lis, [place] [facility] [place], [place] Brynda Peon, NP Karle Plumber, PharmD   Please be sure to bring in all your medications bottles to every appointment.     Thank you for choosing [facility]

## 2022-09-08 NOTE — Telephone Encounter (Signed)
My chart message "closed" Will route pt message to provider as encounter and FYI

## 2022-09-08 NOTE — Progress Notes (Signed)
ReDS Vest / Clip - 09/08/22 0900       ReDS Vest / Clip   Station Marker D    Ruler Value 39    ReDS Value Range Low volume    ReDS Actual Value 27

## 2022-09-08 NOTE — Telephone Encounter (Signed)
Pt returned call to clinic, would like first month of [facility] sent to [employer]. She is aware to store med in the fridge, scheduled PharmD appt on 7/10.

## 2022-09-08 NOTE — Addendum Note (Signed)
Addended by: Kaja Jackowski E on: 09/08/2022 02:15 PM   Modules accepted: Orders

## 2022-09-10 ENCOUNTER — Other Ambulatory Visit (HOSPITAL_BASED_OUTPATIENT_CLINIC_OR_DEPARTMENT_OTHER): Payer: Self-pay

## 2022-09-10 DIAGNOSIS — G4733 Obstructive sleep apnea (adult) (pediatric): Secondary | ICD-10-CM | POA: Diagnosis not present

## 2022-09-10 MED ORDER — WEGOVY 0.25 MG/0.5ML ~~LOC~~ SOAJ
0.2500 mg | SUBCUTANEOUS | 0 refills | Status: DC
  Filled 2022-09-10: qty 2, 28d supply, fill #0

## 2022-09-10 NOTE — Addendum Note (Signed)
Addended by: Refael Fulop E on: 09/10/2022 04:20 PM   Modules accepted: Orders

## 2022-09-10 NOTE — Telephone Encounter (Signed)
Pt called clinic, states [address], [address], [address], [place], and [facility] can't get [place] [facility] stock. Will send in rx to [facility] pharmacy, pt has filled at [facility] before, and see if they have it in stock. Pt aware to call if she cannot get med before her PharmD appt on 7/10.

## 2022-09-20 NOTE — Progress Notes (Unsigned)
Patient ID: MASHA SESTER                 DOB: 1955-12-02                    MRN: [id number]     HPI: Julia West is a 67 y.o. female patient referred to pharmacy clinic by Prince Rome, NP to initiate weight loss therapy with GLP1-RA. PMH is significant for afib, systolic CHF, HTN, CAD s/p CABG in 2003, HLD, left BKA, obesity, former tobacco use, OSA on CPAP, and RA. Most recent BMI 51.  Patient has Medicare insurance which doesn't cover weight loss medications, no history of DM either. Submitted for [facility] coverage under new CV indication which was approved through 03/15/23. Copay $231/month which pt was ok with.  Weigh pt Can send in rest of wegovy titration and call with issues  Diet:   Exercise:   Family History: Mother with prior MI, HTN, heart disease, obesity, and sudden death. Brother with DM.  Social History: Former smoker 1 PPD x 25 years, quit in 2013.  Labs: Lab Results  Component Value Date   HGBA1C 5.8 (H) 05/14/2022    Wt Readings from Last 1 Encounters:  09/08/22 (!) 326 lb 9.6 oz (148.1 kg)    BP Readings from Last 1 Encounters:  09/08/22 98/62   Pulse Readings from Last 1 Encounters:  09/08/22 63       Component Value Date/Time   CHOL 134 05/14/2022 0953   CHOL 117 11/04/2021 1029   TRIG 83 05/14/2022 0953   TRIG 247 (H) 09/12/2013 1137   HDL 41 05/14/2022 0953   HDL 34 (L) 11/04/2021 1029   HDL 35 (L) 09/12/2013 1137   CHOLHDL 3.3 05/14/2022 0953   VLDL 17 05/14/2022 0953   LDLCALC 76 05/14/2022 0953   LDLCALC 64 11/04/2021 1029   LDLCALC 115 (H) 09/12/2013 1137    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    S/p CABG x 2 in 2003   Cancer [facility])    basal cell face   Complication of anesthesia    states low O2 sats post-op 11/13, always slow to awaken   Degenerative joint disease    Dehiscence of amputation stump (HCC)    left BKA   Depression    Fatty liver    HTN (hypertension)     Hyperlipidemia LDL goal <70    Incarcerated hernia    Internal and external hemorrhoids without complication    Lymphedema of arm    right   Morbid obesity (HCC)    OSA on CPAP    severe obstructive sleep apnea with an AHI of 68.8/h and O2 saturations as low as 69% meeting criteria for nocturnal hypoxemia with O2 saturations less than 88% for 272 minutes.  On CPAP at 8cm H2O   Rheumatoid arthritis (HCC)    Surgical wound, non healing ABDOMINAL    has wound vac @ 125 mm Hg   Ventral hernia     Current Outpatient Medications on File Prior to Visit  Medication Sig Dispense Refill   apixaban (ELIQUIS) 5 MG TABS tablet TAKE 1 TABLET BY MOUTH TWICE  DAILY 200 tablet 1   atorvastatin (LIPITOR) 20 MG tablet Take 1 tablet (20 mg total) by mouth daily. 100 tablet 1   carvedilol (COREG) 12.5 MG tablet Take 1 tablet (12.5 mg total) by mouth 2 (two) times daily.  90 tablet 3   cetirizine (ZYRTEC) 10 MG tablet Take 1 tablet (10 mg total) by mouth daily as needed for allergies. 90 tablet 3   dapagliflozin propanediol (FARXIGA) 10 MG TABS tablet Take 1 tablet (10 mg total) by mouth daily before breakfast. 90 tablet 3   fenofibrate 160 MG tablet Take 1 tablet (160 mg total) by mouth daily. 100 tablet 3   fluticasone (FLONASE) 50 MCG/ACT nasal spray Place 1 spray into both nostrils daily as needed for allergies or rhinitis. 47.4 mL 3   furosemide (LASIX) 20 MG tablet TAKE 1 TABLET BY MOUTH EVERY DAY AS NEEDED (Patient taking differently: Take 20 mg by mouth every other day.) 90 tablet 3   HYDROcodone-acetaminophen (NORCO/VICODIN) 5-325 MG tablet Take 1 tablet by mouth every 4 (four) hours as needed. 10 tablet 0   nitroGLYCERIN (NITROSTAT) 0.4 MG SL tablet Place 1 tablet (0.4 mg total) under the tongue every 5 (five) minutes x 3 doses as needed for chest pain. If pain persist after call 911 25 tablet 4   sacubitril-valsartan (ENTRESTO) 49-51 MG Take 1 tablet by mouth 2 (two) times daily. 180 tablet 3    sertraline (ZOLOFT) 50 MG tablet Take 1 tablet (50 mg total) by mouth daily. 100 tablet 2   sodium chloride (OCEAN) 0.65 % SOLN nasal spray Place 2 sprays into both nostrils daily. As needed     spironolactone (ALDACTONE) 25 MG tablet Take 1 tablet (25 mg total) by mouth daily. 90 tablet 3   tizanidine (ZANAFLEX) 2 MG capsule Take 1 capsule (2 mg total) by mouth 3 (three) times daily as needed for muscle spasms. 20 capsule 0   WEGOVY 0.25 MG/0.5ML SOAJ Inject 0.25 mg into the skin once a week. 2 mL 0   No current facility-administered medications on file prior to visit.    Allergies  Allergen Reactions   Ace Inhibitors Cough     Assessment/Plan:  1. Weight loss - Patient has not met goal of at least 5% of body weight loss with comprehensive lifestyle modifications alone in the past 3-6 months. Pharmacotherapy is appropriate to pursue as augmentation. Will start ***. Confirmed patient not ***pregnant and no personal or family history of medullary thyroid carcinoma (MTC) or Multiple Endocrine Neoplasia syndrome type 2 (MEN 2). Injection technique reviewed at today's visit.  Advised patient on common side effects including nausea, diarrhea, dyspepsia, decreased appetite, and fatigue. Counseled patient on reducing meal size and how to titrate medication to minimize side effects. Counseled patient to call if intolerable side effects or if experiencing dehydration, abdominal pain, or dizziness. Patient will adhere to dietary modifications and will target at least 150 minutes of moderate intensity exercise weekly.   Titration Plan:  Will plan to follow the titration plan as below, pending patient is tolerating each dose before increasing to the next. Can slow titration if needed for tolerability.    -Month 1: Inject *** SQ once weekly x 4 weeks -Month 2: Inject *** SQ once weekly x 4 weeks -Month 3: Inject *** SQ once weekly x 4 weeks -Month 4+: Inject *** SQ once weekly   Follow up in ***.

## 2022-09-22 ENCOUNTER — Ambulatory Visit: Payer: Medicare Other | Admitting: Family Medicine

## 2022-09-22 ENCOUNTER — Ambulatory Visit: Payer: Medicare Other | Attending: Cardiology | Admitting: Pharmacist

## 2022-09-22 DIAGNOSIS — I2510 Atherosclerotic heart disease of native coronary artery without angina pectoris: Secondary | ICD-10-CM | POA: Diagnosis not present

## 2022-09-22 NOTE — Patient Instructions (Signed)
Xarelto: Xarelto is a medication similar to Eliquis, but it is taken just once a day. The drug company offers a program called Xarelto with Me Coverage Gap Support. It runs from April 1-December 31. If you are in the coverage gap, you can get Xarelto for $89 (for 30 days) or $250 (for 90 days), plus sales tax if applicable. You can call to enroll at: 888-XARELTO [phone])  Dabigatran: Dabigatran is the generic of Pradaxa (it recently went generic). Like Eliquis and Xarelto, it does not require a lot of monitoring. You might consider contacting your insurance to see what the cost of this medication is through your insurance. There is also a GoodRx coupon for ~$69-75 per month if your insurance does not cover it well.   WGNFAO Counseling Points This medication reduces your appetite and may make you feel fuller longer.  Stop eating when your body tells you that you are full. This will likely happen sooner than you are used to. Fried/greasy food and sweets may upset your stomach - minimize these as much as possible. Store your medication in the fridge until you are ready to use it. Inject your medication in the fatty tissue of your lower abdominal area (2 inches away from belly button) or upper outer thigh. Rotate injection sites. Common side effects include: nausea, diarrhea/constipation, and heartburn, and are more likely to occur if you overeat. Stop your injection for 7 days prior to surgical procedures requiring anesthesia.  Dosing schedule: I'll call you monthly to increase your dose as tolerated. The goal maintenance dose by month 5 is 2.4mg  once weekly  Tips for success: Write down the reasons why you want to lose weight and post it in a place where you'll see it often.  Start small and work your way up. Keep in mind that it takes time to achieve goals, and small steps add up.  Any additional movements help to burn calories. Taking the stairs rather than the elevator and parking at the  far end of your parking lot are easy ways to start. Brisk walking for at least 30 minutes 4 or more days of the week is an excellent goal to work toward  Understanding what it means to feel full: Did you know that it can take 15 minutes or more for your brain to receive the message that you've eaten? That means that, if you eat less food, but consume it slower, you may still feel satisfied.  Eating a lot of fruits and vegetables can also help you feel fuller.  Eat off of smaller plates so that moderate portions don't seem too small  Tips for living a healthier life     Building a Healthy and Balanced Diet Make most of your meal vegetables and fruits -  of your plate. Aim for color and variety, and remember that potatoes don't count as vegetables on the Healthy Eating Plate because of their negative impact on blood sugar.  Go for whole grains -  of your plate. Whole and intact grains--whole wheat, barley, wheat berries, quinoa, oats, brown rice, and foods made with them, such as whole wheat pasta--have a milder effect on blood sugar and insulin than white bread, white rice, and other refined grains.  Protein power -  of your plate. Fish, poultry, beans, and nuts are all healthy, versatile protein sources--they can be mixed into salads, and pair well with vegetables on a plate. Limit red meat, and avoid processed meats such as bacon and sausage.  Healthy plant  oils - in moderation. Choose healthy vegetable oils like olive, canola, soy, corn, sunflower, peanut, and others, and avoid partially hydrogenated oils, which contain unhealthy trans fats. Remember that low-fat does not mean "healthy."  Drink water, coffee, or tea. Skip sugary drinks, limit milk and dairy products to one to two servings per day, and limit juice to a small glass per day.  Stay active. The red figure running across the Healthy Eating Plate's placemat is a reminder that staying active is also important in weight  control.  The main message of the Healthy Eating Plate is to focus on diet quality:  The type of carbohydrate in the diet is more important than the amount of carbohydrate in the diet, because some sources of carbohydrate--like vegetables (other than potatoes), fruits, whole grains, and beans--are healthier than others. The Healthy Eating Plate also advises consumers to avoid sugary beverages, a major source of calories--usually with little nutritional value--in the American diet. The Healthy Eating Plate encourages consumers to use healthy oils, and it does not set a maximum on the percentage of calories people should get each day from healthy sources of fat. In this way, the Healthy Eating Plate recommends the opposite of the low-fat message promoted for decades by the USDA.  [employer].ee  SUGAR  Sugar is a huge problem in the modern day diet. Sugar is a big contributor to heart disease, diabetes, high triglyceride levels, fatty liver disease and obesity. Sugar is hidden in almost all packaged foods/beverages. Added sugar is extra sugar that is added beyond what is naturally found and has no nutritional benefit for your body. The American Heart Association recommends limiting added sugars to no more than 25g for women and 36 grams for men per day. There are many names for sugar including maltose, sucrose (names ending in "ose"), high fructose corn syrup, molasses, cane sugar, corn sweetener, raw sugar, syrup, honey or fruit juice concentrate.   One of the best ways to limit your added sugars is to stop drinking sweetened beverages such as soda, sweet tea, and fruit juice.  There is 65g of added sugars in one 20oz bottle of Coke! That is equal to 7.5 donuts.   Pay attention and read all nutrition facts labels. Below is an examples of a nutrition facts label. The #1 is showing you the total sugars where the # 2 is showing you the added sugars.  This one serving has almost the max amount of added sugars per day!   EXERCISE  Exercise is good. We've all heard that. In an ideal world, we would all have time and resources to get plenty of it. When you are active, your heart pumps more efficiently and you will feel better.  Multiple studies show that even walking regularly has benefits that include living a longer life. The American Heart Association recommends 150 minutes per week of exercise (30 minutes per day most days of the week). You can do this in any increment you wish. Nine or more 10-minute walks count. So does an hour-long exercise class. Break the time apart into what will work in your life. Some of the best things you can do include walking briskly, jogging, cycling or swimming laps. Not everyone is ready to "exercise." Sometimes we need to start with just getting active. Here are some easy ways to be more active throughout the day:  Take the stairs instead of the elevator  Go for a 10-15 minute walk during your lunch break (find a friend to  make it more enjoyable)  When shopping, park at the back of the parking lot  If you take public transportation, get off one stop early and walk the extra distance  Pace around while making phone calls  Check with your doctor if you aren't sure what your limitations may be. Always remember to drink plenty of water when doing any type of exercise. Don't feel like a failure if you're not getting the 90-150 minutes per week. If you started by being a couch potato, then just a 10-minute walk each day is a huge improvement. Start with little victories and work your way up.   HEALTHY EATING TIPS              Plan ahead: make a menu of the meals for a week then create a grocery list to go with that menu. Consider meals that easily stretch into a night of leftovers, such as stews or casseroles. Or consider making two of your favorite meal and put one in the freezer for another night. Try a night or two  each week that is "meatless" or "no cook" such as salads. When you get home from the grocery store wash and prepare your vegetables and fruits. Then when you need them they are ready to go.   Tips for going to the grocery store:  Buy store or generic brands  Check the weekly ad from your store on-line or in their in-store flyer  Look at the unit price on the shelf tag to compare/contrast the costs of different items  Buy fruits/vegetables in season  Carrots, bananas and apples are low-cost, naturally healthy items  If meats or frozen vegetables are on sale, buy some extras and put in your freezer  Limit buying prepared or "ready to eat" items, even if they are pre-made salads or fruit snacks  Do not shop when you're hungry  Foods at eye level tend to be more expensive. Look on the high and low shelves for deals.  Consider shopping at the farmer's market for fresh foods in season.  Avoid the cookie and chip aisles (these are expensive, high in calories and low in nutritional value). Shop on the outside of the grocery store.  Healthy food preparations:  If you can't get lean hamburger, be sure to drain the fat when cooking  Steam, saut (in olive oil), grill or bake foods  Experiment with different seasonings to avoid adding salt to your foods. Kosher salt, sea salt and Himalayan salt are all still salt and should be avoided. Try seasoning food with onion, garlic, thyme, rosemary, basil ect. Onion powder or garlic powder is ok. Avoid if it says salt (ie garlic salt).

## 2022-09-27 DIAGNOSIS — G4733 Obstructive sleep apnea (adult) (pediatric): Secondary | ICD-10-CM | POA: Diagnosis not present

## 2022-10-08 ENCOUNTER — Other Ambulatory Visit (HOSPITAL_COMMUNITY): Payer: Self-pay | Admitting: Cardiology

## 2022-10-11 ENCOUNTER — Encounter: Payer: Self-pay | Admitting: Pharmacist

## 2022-10-11 ENCOUNTER — Other Ambulatory Visit (HOSPITAL_BASED_OUTPATIENT_CLINIC_OR_DEPARTMENT_OTHER): Payer: Self-pay

## 2022-10-11 MED ORDER — WEGOVY 0.5 MG/0.5ML ~~LOC~~ SOAJ
0.5000 mg | SUBCUTANEOUS | 0 refills | Status: DC
  Filled 2022-10-11: qty 2, 28d supply, fill #0

## 2022-10-13 ENCOUNTER — Other Ambulatory Visit (HOSPITAL_BASED_OUTPATIENT_CLINIC_OR_DEPARTMENT_OTHER): Payer: Self-pay

## 2022-10-13 ENCOUNTER — Other Ambulatory Visit (HOSPITAL_COMMUNITY): Payer: Self-pay

## 2022-10-15 ENCOUNTER — Other Ambulatory Visit (HOSPITAL_BASED_OUTPATIENT_CLINIC_OR_DEPARTMENT_OTHER): Payer: Self-pay

## 2022-10-25 ENCOUNTER — Other Ambulatory Visit (HOSPITAL_COMMUNITY): Payer: Self-pay | Admitting: Cardiology

## 2022-10-25 MED ORDER — DAPAGLIFLOZIN PROPANEDIOL 10 MG PO TABS: 10.0000 mg | ORAL_TABLET | Freq: Every day | ORAL | 3 refills | Status: DC

## 2022-10-25 NOTE — Telephone Encounter (Signed)
90 day farxiga toaz&me

## 2022-10-25 NOTE — Telephone Encounter (Signed)
Per pharmacy team Script for farxiga needed to [facility] Script sent

## 2022-10-29 MED ORDER — ENTRESTO 49-51 MG PO TABS: 1.0000 | ORAL_TABLET | Freq: Two times a day (BID) | ORAL | 3 refills | Status: DC

## 2022-10-29 NOTE — Telephone Encounter (Signed)
Updated script needed for entresto patient assistance -script sent to [facility]

## 2022-10-29 NOTE — Addendum Note (Signed)
Addended by: Theresia Bough on: 10/29/2022 09:34 AM   Modules accepted: Orders

## 2022-11-09 ENCOUNTER — Other Ambulatory Visit (HOSPITAL_BASED_OUTPATIENT_CLINIC_OR_DEPARTMENT_OTHER): Payer: Self-pay

## 2022-11-09 ENCOUNTER — Other Ambulatory Visit (HOSPITAL_COMMUNITY): Payer: Self-pay | Admitting: Cardiology

## 2022-11-09 DIAGNOSIS — I4819 Other persistent atrial fibrillation: Secondary | ICD-10-CM

## 2022-11-09 MED ORDER — APIXABAN 5 MG PO TABS
5.0000 mg | ORAL_TABLET | Freq: Two times a day (BID) | ORAL | 1 refills | Status: DC
Start: 2022-11-09 — End: 2023-07-25

## 2022-11-09 MED ORDER — WEGOVY 1 MG/0.5ML ~~LOC~~ SOAJ
1.0000 mg | SUBCUTANEOUS | 0 refills | Status: DC
  Filled 2022-11-09: qty 2, 28d supply, fill #0

## 2022-11-09 NOTE — Telephone Encounter (Signed)
Patient called to request script to pharmacy in [place] to assist with cost Script printed and faxed to [phone] [facility] pharmacy service

## 2022-11-10 ENCOUNTER — Other Ambulatory Visit: Payer: Self-pay

## 2022-11-11 ENCOUNTER — Other Ambulatory Visit (HOSPITAL_BASED_OUTPATIENT_CLINIC_OR_DEPARTMENT_OTHER): Payer: Self-pay

## 2022-11-18 NOTE — Progress Notes (Signed)
[facility] Quality Team Note  Name: Julia West Date of Birth: 02/12/1956 MRN: [id number] Date: 11/18/2022  [facility] Quality Team has reviewed this patient's chart, please see recommendations below:  Diabetic Retinal Eye Exam; Patient requests [facility] Quality Coordinator to schedule Diabetic Retinal Screening at [facility] [place] [facility] event 11/24/2022, 9:30am).

## 2022-12-01 ENCOUNTER — Ambulatory Visit: Payer: Medicare Other

## 2022-12-01 DIAGNOSIS — E11610 Type 2 diabetes mellitus with diabetic neuropathic arthropathy: Secondary | ICD-10-CM

## 2022-12-01 LAB — HM DIABETES EYE EXAM

## 2022-12-01 NOTE — Progress Notes (Signed)
Julia West arrived 12/01/2022 and has given verbal consent to obtain images and complete their overdue diabetic retinal screening.  The images have been sent to an ophthalmologist or optometrist for review and interpretation.  Results will be sent back to Mechele Claude, MD for review.  Patient has been informed they will be contacted when we receive the results via telephone or MyChart

## 2022-12-08 ENCOUNTER — Other Ambulatory Visit (HOSPITAL_BASED_OUTPATIENT_CLINIC_OR_DEPARTMENT_OTHER): Payer: Self-pay

## 2022-12-08 ENCOUNTER — Telehealth: Payer: Self-pay | Admitting: Pharmacist

## 2022-12-08 MED ORDER — WEGOVY 1.7 MG/0.75ML ~~LOC~~ SOAJ
1.7000 mg | SUBCUTANEOUS | 0 refills | Status: DC
  Filled 2022-12-08: qty 3, 28d supply, fill #0

## 2022-12-08 NOTE — Telephone Encounter (Signed)
Called pt to follow up with [facility]. She reports nausea Mon and Tues of this week, otherwise feeling fine. Down 25 lbs on her scale. Has 1 more dose of 1mg  due next Sunday. Agreeable to increasing dose to 1.7mg , rx sent in for next month.

## 2022-12-16 ENCOUNTER — Other Ambulatory Visit (HOSPITAL_BASED_OUTPATIENT_CLINIC_OR_DEPARTMENT_OTHER): Payer: Self-pay

## 2022-12-16 ENCOUNTER — Other Ambulatory Visit: Payer: Self-pay | Admitting: Family Medicine

## 2022-12-16 ENCOUNTER — Ambulatory Visit: Payer: Medicare Other | Admitting: Family Medicine

## 2022-12-16 DIAGNOSIS — E782 Mixed hyperlipidemia: Secondary | ICD-10-CM

## 2022-12-23 ENCOUNTER — Other Ambulatory Visit: Payer: Self-pay

## 2022-12-23 DIAGNOSIS — E11610 Type 2 diabetes mellitus with diabetic neuropathic arthropathy: Secondary | ICD-10-CM

## 2022-12-29 ENCOUNTER — Ambulatory Visit (HOSPITAL_COMMUNITY)
Admission: RE | Admit: 2022-12-29 | Discharge: 2022-12-29 | Disposition: A | Discharge status: Home / Self Care | Payer: Medicare Other | Source: Ambulatory Visit | Attending: Cardiology | Admitting: Cardiology

## 2022-12-29 ENCOUNTER — Encounter (HOSPITAL_COMMUNITY): Payer: Self-pay | Admitting: Cardiology

## 2022-12-29 VITALS — BP 112/70 | HR 86 | Wt 296.6 lb

## 2022-12-29 DIAGNOSIS — Z6841 Body Mass Index (BMI) 40.0 and over, adult: Secondary | ICD-10-CM | POA: Diagnosis not present

## 2022-12-29 DIAGNOSIS — R0602 Shortness of breath: Secondary | ICD-10-CM | POA: Insufficient documentation

## 2022-12-29 DIAGNOSIS — I110 Hypertensive heart disease with heart failure: Secondary | ICD-10-CM | POA: Insufficient documentation

## 2022-12-29 DIAGNOSIS — I2510 Atherosclerotic heart disease of native coronary artery without angina pectoris: Secondary | ICD-10-CM | POA: Diagnosis not present

## 2022-12-29 DIAGNOSIS — I444 Left anterior fascicular block: Secondary | ICD-10-CM | POA: Diagnosis not present

## 2022-12-29 DIAGNOSIS — Z7984 Long term (current) use of oral hypoglycemic drugs: Secondary | ICD-10-CM | POA: Insufficient documentation

## 2022-12-29 DIAGNOSIS — Z89512 Acquired absence of left leg below knee: Secondary | ICD-10-CM | POA: Insufficient documentation

## 2022-12-29 DIAGNOSIS — G4733 Obstructive sleep apnea (adult) (pediatric): Secondary | ICD-10-CM | POA: Diagnosis not present

## 2022-12-29 DIAGNOSIS — Z7901 Long term (current) use of anticoagulants: Secondary | ICD-10-CM | POA: Diagnosis not present

## 2022-12-29 DIAGNOSIS — Z951 Presence of aortocoronary bypass graft: Secondary | ICD-10-CM | POA: Diagnosis not present

## 2022-12-29 DIAGNOSIS — I480 Paroxysmal atrial fibrillation: Secondary | ICD-10-CM | POA: Diagnosis not present

## 2022-12-29 DIAGNOSIS — I5022 Chronic systolic (congestive) heart failure: Secondary | ICD-10-CM | POA: Diagnosis not present

## 2022-12-29 DIAGNOSIS — E669 Obesity, unspecified: Secondary | ICD-10-CM | POA: Insufficient documentation

## 2022-12-29 DIAGNOSIS — E785 Hyperlipidemia, unspecified: Secondary | ICD-10-CM | POA: Insufficient documentation

## 2022-12-29 DIAGNOSIS — Z79899 Other long term (current) drug therapy: Secondary | ICD-10-CM | POA: Diagnosis not present

## 2022-12-29 LAB — BASIC METABOLIC PANEL
Anion gap: 8 (ref 5–15)
BUN: 16 mg/dL (ref 8–23)
CO2: 26 mmol/L (ref 22–32)
Calcium: 9.4 mg/dL (ref 8.9–10.3)
Chloride: 109 mmol/L (ref 98–111)
Creatinine, Ser: 0.99 mg/dL (ref 0.44–1.00)
GFR, Estimated: >60 mL/min (ref >60)
Glucose, Bld: 90 mg/dL (ref 70–99)
Potassium: 4.7 mmol/L (ref 3.5–5.1)
Sodium: 143 mmol/L (ref 135–145)

## 2022-12-29 LAB — BRAIN NATRIURETIC PEPTIDE: B Natriuretic Peptide: 284.2 pg/mL — ABNORMAL HIGH (ref 0.0–100.0)

## 2022-12-29 MED ORDER — CARVEDILOL 12.5 MG PO TABS: 18.7500 mg | ORAL_TABLET | Freq: Two times a day (BID) | ORAL | 3 refills | Status: DC

## 2022-12-29 NOTE — Patient Instructions (Signed)
Good to see you today!  INCREASE coreg to 18.75 (1 1/2 tablets) Twice daily  Labs done today, your results will be available in MyChart, we will contact you for abnormal readings.  Your physician has requested that you have an echocardiogram. Echocardiography is a painless test that uses sound waves to create images of your heart. It provides your doctor with information about the size and shape of your heart and how well your heart's chambers and valves are working. This procedure takes approximately one hour. There are no restrictions for this procedure. Please do NOT wear cologne, perfume, aftershave, or lotions (deodorant is allowed). Please arrive 15 minutes prior to your appointment time.  Your physician recommends that you schedule a follow-up appointment in: 3 months with echocardiogram(January) Call in November to schedule an appointment  If you have any questions or concerns before your next appointment please send [place] a message through [place] or call our office at [phone].    TO LEAVE A MESSAGE FOR THE NURSE SELECT OPTION 2, PLEASE LEAVE A MESSAGE INCLUDING: YOUR NAME DATE OF BIRTH CALL BACK NUMBER REASON FOR CALL**this is important as we prioritize the call backs  YOU WILL RECEIVE A CALL BACK THE SAME DAY AS LONG AS YOU CALL BEFORE 4:00 PM  At the [facility], you and your health needs are our priority. As part of our continuing mission to provide you with exceptional heart care, we have created designated Provider Care Teams. These Care Teams include your primary Cardiologist (physician) and Advanced Practice Providers (APPs- Physician Assistants and Nurse Practitioners) who all work together to provide you with the care you need, when you need it.   You may see any of the following providers on your designated Care Team at your next follow up: Dr Arvilla Meres Dr Marca Ancona Dr. Dorthula Nettles Dr. Clearnce Hasten Amy Filbert Schilder, NP Robbie Lis, [place] [facility], [place] Brynda Peon, NP [place] Lee, NP Karle Plumber, PharmD   Please be sure to bring in all your medications bottles to every appointment.    Thank you for choosing [facility]

## 2022-12-30 NOTE — Progress Notes (Signed)
Primary Care: Mechele Claude, MD HF Cardiology: Dr Shirlee Latch  HPI: Ms Kuri is a 67 y.o. with a history of PAF,  HTN, CAD, CABG  2003, hyperlipidemia, LBKA, lymphedema RLE/RUE, and chronic HFrEF.   Admitted in 7/23 with atrial fibrillation/RVR and acute systolic CHF. Diuresed with IV lasix. Echo showed newly reduced EF 30-35%. Had DC-CV 09/22/21 with restoration of SR. Plan to f/u with Dr Anselm Jungling for ablation. Started on SGLT2i and MRA. Discharged on BB and ARB. Discharge weight 305 pounds.   Zio in 8/23 showed 19.2% PVCs.  She was transitioned from Tikosyn to amiodarone.  Atrial fibrillation was persistent, she failed DCCV in 10/23.  She saw Dr. Elberta Fortis, decided against ablation due to obesity.  Amiodarone was stopped. She has been in rate-controlled AF since that time.    Echo 11/23 showed EF 40-45%, RV moderately reduced  Today she returns for HF follow up with her husband.  She has lost 30 lbs on semaglutide.  Breathing is better with weight loss.  She wears her left leg prothesis and uses crutches for stability when she walks, gets short of breath walking longer distances.  Does fine around the house.  No chest pain.  No orthopnea/PND.  No lightheadedness. She has been using her CPAP regularly.   Labs (8/23): LDL 64 Labs (10/23): K 5, creatinine 1.17 Labs (12/23): K 4.8, creatinine 1.23 Labs (3/24): LDL 76 Labs (6/24): K 4.4, creatinine 1.02  ECG (personally reviewed): atrial fibrillation, PVC, LAFB  PMH: 1. CAD: CABG x 2 in 2003 with LIMA-LAD, free RIMA-LCX 2. Rheumatoid arthritis 3. Left Charcot foot with left BKA.  - Peripheral arterial dopplers (12/23) with mild right leg PAD.  4. HTN 5. Prior smoker 6. Atrial fibrillation: Chronic.  Has failed Tikosyn, amiodarone, and DCCV.  7. PVCs: Zio (8/23) with 19.2% PVCs.  - Zio (3/24) with 6.5% PVCs 8. OSA: CPAP.  9. Chronic systolic CHF: ?Ischemic cardiomyopathy versus tachycardia-mediated.   - Echo 4/22: EF 50-55% - Echo  7/23: EF 30-35% RV normal  - Echo 11/23: EF 40-45%, mild LV dilation, moderate RV enlargement, moderately decreased RV systolic function, mild MR, IVC normal.  10. Hyperlipidemia   Review of Systems:  All systems reviewed and negative except as per HPI.   Current Outpatient Medications  Medication Sig Dispense Refill   apixaban (ELIQUIS) 5 MG TABS tablet Take 1 tablet (5 mg total) by mouth 2 (two) times daily. 180 tablet 1   atorvastatin (LIPITOR) 20 MG tablet TAKE 1 TABLET BY MOUTH DAILY 100 tablet 0   cetirizine (ZYRTEC) 10 MG tablet Take 1 tablet (10 mg total) by mouth daily as needed for allergies. 90 tablet 3   dapagliflozin propanediol (FARXIGA) 10 MG TABS tablet Take 1 tablet (10 mg total) by mouth daily before breakfast. 90 tablet 3   fenofibrate 160 MG tablet Take 1 tablet (160 mg total) by mouth daily. 100 tablet 3   fluticasone (FLONASE) 50 MCG/ACT nasal spray Place 1 spray into both nostrils daily as needed for allergies or rhinitis. 47.4 mL 3   furosemide (LASIX) 20 MG tablet Take 20 mg by mouth every other day.     nitroGLYCERIN (NITROSTAT) 0.4 MG SL tablet Place 1 tablet (0.4 mg total) under the tongue every 5 (five) minutes x 3 doses as needed for chest pain. If pain persist after call 911 25 tablet 4   sacubitril-valsartan (ENTRESTO) 49-51 MG Take 1 tablet by mouth 2 (two) times daily. 180 tablet 3  Semaglutide-Weight Management (WEGOVY) 1.7 MG/0.75ML SOAJ Inject 1.7 mg into the skin once a week. 3 mL 0   sertraline (ZOLOFT) 50 MG tablet Take 1 tablet (50 mg total) by mouth daily. 100 tablet 2   sodium chloride (OCEAN) 0.65 % SOLN nasal spray Place 2 sprays into both nostrils daily. As needed     spironolactone (ALDACTONE) 25 MG tablet Take 1 tablet (25 mg total) by mouth daily. 90 tablet 3   carvedilol (COREG) 12.5 MG tablet Take 1.5 tablets (18.75 mg total) by mouth 2 (two) times daily. 180 tablet 3   No current facility-administered medications for this encounter.    Allergies  Allergen Reactions   Ace Inhibitors Cough   Social History   Socioeconomic History   Marital status: Married    Spouse name: Renae Fickle   Number of children: 5   Years of education: Not on file   Highest education level: GED or equivalent  Occupational History   Occupation: Facilities manager    Comment: In the [employer]   Occupation: retired  Tobacco Use   Smoking status: Former    Current packs/day: 0.00    Average packs/day: 1 pack/day for 25.0 years (25.0 ttl pk-yrs)    Types: Cigarettes    Start date: 11/17/1986    Quit date: 11/17/2011    Years since quitting: 11.1    Passive exposure: Current   Smokeless tobacco: Never  Vaping Use   Vaping status: Never Used  Substance and Sexual Activity   Alcohol use: No   Drug use: No   Sexual activity: Yes    Birth control/protection: Post-menopausal  Other Topics Concern   Not on file  Social History Narrative   Lives in [place] with her husband. 5 children. One of whom has pulmonary stenosis.    Left BKA, uses crutches - doesn't get much exercise   Social Determinants of Health   Financial Resource Strain: Low Risk  (06/14/2022)   Overall Financial Resource Strain (CARDIA)    Difficulty of Paying Living Expenses: Not hard at all  Food Insecurity: No Food Insecurity (06/14/2022)   Hunger Vital Sign    Worried About Running Out of Food in the Last Year: Never true    Ran Out of Food in the Last Year: Never true  Transportation Needs: No Transportation Needs (06/14/2022)   PRAPARE - Administrator, Civil Service (Medical): No    Lack of Transportation (Non-Medical): No  Physical Activity: Inactive (06/14/2022)   Exercise Vital Sign    Days of Exercise per Week: 0 days    Minutes of Exercise per Session: 30 min  Stress: No Stress Concern Present (06/14/2022)   [employer] of Occupational Health - Occupational Stress Questionnaire    Feeling of Stress : Not at all  Social Connections: Moderately Isolated  (06/14/2022)   Social Connection and Isolation Panel [NHANES]    Frequency of Communication with Friends and Family: More than three times a week    Frequency of Social Gatherings with Friends and Family: Once a week    Attends Religious Services: Never    Database administrator or Organizations: No    Attends Banker Meetings: Never    Marital Status: Married  Catering manager Violence: Not At Risk (06/10/2022)   Humiliation, Afraid, Rape, and Kick questionnaire    Fear of Current or Ex-Partner: No    Emotionally Abused: No    Physically Abused: No    Sexually Abused: No  Family History  Problem Relation Age of Onset   Heart attack Mother    Hypertension Mother    Heart disease Mother    Sudden death Mother    Obesity Mother    Hypertension Sister    Diabetes Brother    Birth defects Son        heart defect   BP 112/70   Pulse 86   Wt 134.5 kg (296 lb 9.6 oz)   SpO2 94%   BMI 46.45 kg/m   Wt Readings from Last 3 Encounters:  12/29/22 134.5 kg (296 lb 9.6 oz)  09/22/22 (!) 147 kg (324 lb)  09/08/22 (!) 148.1 kg (326 lb 9.6 oz)   PHYSICAL EXAM: General: NAD Neck: No JVD, no thyromegaly or thyroid nodule.  Lungs: Clear to auscultation bilaterally with normal respiratory effort. CV: Nondisplaced PMI.  Heart regular S1/S2, no S3/S4, no murmur.  Trace right ankle edema.  No carotid bruit.  Difficult to palpate right foot pulses.   Abdomen: Soft, nontender, no hepatosplenomegaly, no distention.  Skin: Intact without lesions or rashes.  Neurologic: Alert and oriented x 3.  Psych: Normal affect. Extremities: No clubbing or cyanosis. Left BKA with prosthesis.  HEENT: Normal.   ASSESSMENT & PLAN: 1. Chronic systolic CHF:  Echo 09/2021 down to 30-35% from previous 50-55% back 2022.  Patient was in atrial fibrillation with RVR, ?tachy-mediated cardiomyopathy.  Echo in 11/23 with controlled rate (still in atrial fibrillation) showed EF still low at 40-45%, mild LV  dilation, moderate RV dilation and moderately decreased RV systolic function.  She may have primarily ischemic CMP given CAD (history of CABG).  PVCs 19.2% on 8/23 Zio monitor and possibly contributing to cardiomyopathy, Zio monitor in 3/24 showed PVCs down to 6.5%.  NYHA class II symptoms, improved with weight loss. She is not volume overloaded today on exam. - Continue Entresto 49/51 bid, decreased in past due to orthostasis.  - Continue Lasix 20 mg every other day.  BMET/BNP today. - Continue Farxiga 10 mg daily.  - Increase Coreg to 18.75 mg bid.  - Continue spironolactone 25 mg daily.  - Her insurance refused RHC/LHC (fall in EF, known CAD).  Will hold off given stable/improved symptoms currently and treat medically for now.  Repeat echo, if EF lower, would revisit cardiac cath.  2. Atrial fibrillation: Now chronic.  She has failed amiodarone and Tikosyn as well as DCCV.  She saw EP and was deemed to be not a candidate for ablation due to obesity.  - Would reassess ablation vs cardioversion if she continues to lose weight.  - Continue Eliquis.  - Good rate control on Coreg (increasing today).  3. Obesity: Body mass index is 46.45 kg/m.  She has lost 30 lbs.  - Continue semaglutide.  4. PVCs: 19.2% PVCs on 8/23 Zio but down to 6.5% on 3/24 Zio.  She is now off amiodarone.  - Increasing Coreg.  5. OSA: Continue CPAP.  6. CAD: CABG 2003.  No chest pain.  - Not on ASA given apixaban use.  - Continue atorvastatin. 7. 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  Mild right leg PAD on peripheral arterial dopplers in 12/23.   Follow up in 3 months with APP.   Marca Ancona  12/30/2022

## 2023-01-10 ENCOUNTER — Telehealth: Payer: Self-pay | Admitting: Pharmacist

## 2023-01-10 ENCOUNTER — Other Ambulatory Visit (HOSPITAL_BASED_OUTPATIENT_CLINIC_OR_DEPARTMENT_OTHER): Payer: Self-pay

## 2023-01-10 MED ORDER — WEGOVY 2.4 MG/0.75ML ~~LOC~~ SOAJ
2.4000 mg | SUBCUTANEOUS | 11 refills | Status: DC
  Filled 2023-01-10: qty 3, 28d supply, fill #0
  Filled 2023-02-07 – 2023-02-09 (×2): qty 3, 28d supply, fill #1
  Filled 2023-03-03: qty 3, 28d supply, fill #2
  Filled 2023-03-18 – 2023-03-24 (×2): qty 3, 28d supply, fill #3

## 2023-01-10 NOTE — Telephone Encounter (Signed)
Called pt to follow up with [facility] 1.7mg  dose. Left voicemail. If tolerating well, will increase to 2.4mg  maintenance dose. Down from baseline of 324 lbs to 296 lbs at recent HF appt.

## 2023-01-10 NOTE — Telephone Encounter (Signed)
Spoke with pt, tolerating Wegovy well. Lost 32 lbs so far. Changing insurance in January. Advised her to let us know when new plan is active, will need to see if new plan continues to cover weight loss meds. She stated new plan would cover Ozempic, advised her that is only for pts with T2DM which she doesn't have. Rx sent in for maintenance dose of 2.4mg . She'll let me know if Dec if she wants rx sent in as 3 month supply to last longer into new year, rx currently expensive though and wishes to pick up as 1 month supply for the mean time.

## 2023-01-13 ENCOUNTER — Encounter: Payer: Self-pay | Admitting: Pharmacist

## 2023-01-26 ENCOUNTER — Ambulatory Visit: Payer: Medicare Other | Admitting: Family Medicine

## 2023-01-26 ENCOUNTER — Encounter: Payer: Self-pay | Admitting: Family Medicine

## 2023-01-26 VITALS — BP 107/71 | HR 96 | Temp 96.8°F | Ht 67.0 in | Wt 291.0 lb

## 2023-01-26 DIAGNOSIS — J329 Chronic sinusitis, unspecified: Secondary | ICD-10-CM

## 2023-01-26 DIAGNOSIS — I10 Essential (primary) hypertension: Secondary | ICD-10-CM | POA: Diagnosis not present

## 2023-01-26 DIAGNOSIS — E782 Mixed hyperlipidemia: Secondary | ICD-10-CM

## 2023-01-26 DIAGNOSIS — J40 Bronchitis, not specified as acute or chronic: Secondary | ICD-10-CM | POA: Diagnosis not present

## 2023-01-26 DIAGNOSIS — E11610 Type 2 diabetes mellitus with diabetic neuropathic arthropathy: Secondary | ICD-10-CM

## 2023-01-26 DIAGNOSIS — Z7984 Long term (current) use of oral hypoglycemic drugs: Secondary | ICD-10-CM | POA: Diagnosis not present

## 2023-01-26 LAB — BAYER DCA HB A1C WAIVED: HB A1C (BAYER DCA - WAIVED): 5.1 % (ref 4.8–5.6)

## 2023-01-26 MED ORDER — ATORVASTATIN CALCIUM 20 MG PO TABS
20.0000 mg | ORAL_TABLET | Freq: Every day | ORAL | 3 refills | Status: AC
Start: 2023-01-26 — End: ?

## 2023-01-26 MED ORDER — MOXIFLOXACIN HCL 400 MG PO TABS
400.0000 mg | ORAL_TABLET | Freq: Every day | ORAL | 0 refills | Status: DC
Start: 2023-01-26 — End: 2023-07-20

## 2023-01-26 NOTE — Progress Notes (Signed)
Subjective:  Patient ID: Julia West, female    DOB: 11-03-1955  Age: 67 y.o. MRN: [id number]  CC: Medical Management of Chronic Issues   HPI Julia West presents forFollow-up of diabetes. Patient doesn't check blood sugar at home.  Patient denies symptoms such as polyuria, polydipsia, excessive hunger, nausea No significant hypoglycemic spells noted. Medications reviewed. Pt reports taking them regularly without complication/adverse reaction being reported today.   Yellow productive cough.    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   She has a past surgical history that includes Cesarean section; Cholecystectomy; Coronary artery bypass graft (2003); Incisional hernia repair (02/07/2012); Bowel resection (02/07/2012); Application if wound vac; Incision and drainage of wound (04/17/2012); Lesion removal (04/17/2012); Incision and drainage of wound (N/A, 04/24/2012); Incision and drainage of wound (N/A, 05/01/2012); Incision and drainage of wound (N/A, 05/08/2012); Cardiovascular stress test (01-17-2012  [facility]); Incision and drainage of wound (N/A, 05/15/2012); Incision and drainage of wound (N/A, 05/22/2012); Incision and drainage of wound (N/A, 06/28/2012); polypectomy (N/A, 05/14/2014); Hysteroscopy with D & C (N/A, 05/14/2014); ORIF toe fracture (Left, 02/09/2017); Amputation (Left, 05/06/2017); I & D extremity (Left, 06/21/2017); Stump revision (Left, 08/31/2017); Cardioversion (N/A, 07/14/2020); Cardioversion (N/A, 09/22/2021); and Cardioversion (N/A,  12/30/2021).   Julia West family history includes Birth defects in Julia West son; Diabetes in Julia West brother; Heart attack in Julia West mother; Heart disease in Julia West mother; Hypertension in Julia West mother and sister; Obesity in Julia West mother; Sudden death in Julia West mother.She reports that she quit smoking about 11 years ago. Julia West smoking use included cigarettes. She started smoking about 36 years ago. She has a 25 pack-year smoking history. She has been exposed to tobacco smoke. She has never used smokeless tobacco. She reports that she does not drink alcohol and does not use drugs.  Current Outpatient Medications on File Prior to Visit  Medication Sig Dispense Refill   apixaban (ELIQUIS) 5 MG TABS tablet Take 1 tablet (5 mg total) by mouth 2 (two) times daily. 180 tablet 1   carvedilol (COREG) 12.5 MG tablet Take 1.5 tablets (18.75 mg total) by mouth 2 (two) times daily. 180 tablet 3   cetirizine (ZYRTEC) 10 MG tablet Take 1 tablet (10 mg total) by mouth daily as needed for allergies. 90 tablet 3   dapagliflozin propanediol (FARXIGA) 10 MG TABS tablet Take 1 tablet (10 mg total) by mouth daily before breakfast. 90 tablet 3   fenofibrate 160 MG tablet Take 1 tablet (160 mg total) by mouth daily. 100 tablet 3   fluticasone (FLONASE) 50 MCG/ACT nasal spray Place 1 spray into both nostrils daily as needed for allergies or rhinitis. 47.4 mL 3   furosemide (LASIX) 20 MG tablet Take 20 mg by mouth every other day.     nitroGLYCERIN (NITROSTAT) 0.4 MG SL tablet Place 1 tablet (0.4 mg total) under the tongue every 5 (five) minutes x 3 doses as needed for chest pain. If pain persist after call 911 25 tablet 4   sacubitril-valsartan (ENTRESTO) 49-51 MG Take 1 tablet by mouth 2 (two) times daily. 180 tablet 3   sertraline (ZOLOFT) 50 MG tablet Take 1 tablet (50 mg total)  by mouth daily. 100 tablet 2   sodium chloride (OCEAN) 0.65 % SOLN nasal spray Place 2 sprays into both nostrils daily. As needed     spironolactone (ALDACTONE) 25 MG tablet  Take 1 tablet (25 mg total) by mouth daily. 90 tablet 3   WEGOVY 2.4 MG/0.75ML SOAJ Inject 2.4 mg into the skin once a week. 3 mL 11   No current facility-administered medications on file prior to visit.    ROS Review of Systems  Constitutional: Negative.  Negative for activity change, appetite change, chills and fever.  HENT:  Positive for congestion, postnasal drip, rhinorrhea and sinus pressure. Negative for ear discharge, ear pain, hearing loss, nosebleeds, sneezing and trouble swallowing.   Eyes:  Negative for visual disturbance.  Respiratory:  Negative for chest tightness and shortness of breath.   Cardiovascular:  Negative for chest pain and palpitations.  Gastrointestinal:  Negative for abdominal pain.  Musculoskeletal:  Negative for arthralgias.  Skin:  Negative for rash.    Objective:  BP 107/71   Pulse 96   Temp (!) 96.8 F (36 C)   Ht 5\' 7"  (1.702 m)   Wt 291 lb (132 kg)   SpO2 93%   BMI 45.58 kg/m   BP Readings from Last 3 Encounters:  01/26/23 107/71  12/29/22 112/70  09/08/22 98/62    Wt Readings from Last 3 Encounters:  01/26/23 291 lb (132 kg)  12/29/22 296 lb 9.6 oz (134.5 kg)  09/22/22 (!) 324 lb (147 kg)     Physical Exam Constitutional:      General: She is not in acute distress.    Appearance: She is well-developed.  Cardiovascular:     Rate and Rhythm: Normal rate and regular rhythm.  Pulmonary:     Breath sounds: Normal breath sounds.  Musculoskeletal:        General: Normal range of motion.  Skin:    General: Skin is warm and dry.  Neurological:     Mental Status: She is alert and oriented to person, place, and time.       Assessment & Plan:   Julia West was seen today for medical management of chronic issues.  Diagnoses and all orders for this visit:  Mixed hyperlipidemia -     Lipid panel -     atorvastatin (LIPITOR) 20 MG tablet; Take 1 tablet (20 mg total) by mouth daily.  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      I have changed Julia West's atorvastatin. I am also having Julia West start on moxifloxacin. Additionally, I am having Julia West maintain Julia West nitroGLYCERIN, spironolactone, sertraline, cetirizine, fluticasone, fenofibrate, sodium chloride, furosemide, dapagliflozin propanediol, Entresto, apixaban, carvedilol, and Wegovy.  Meds ordered this encounter  Medications   atorvastatin (LIPITOR) 20 MG tablet    Sig: Take 1 tablet (20 mg total) by mouth daily.    Dispense:  100 tablet    Refill:  3    Please send a replace/new response with 100-Day Supply if appropriate to maximize member benefit. Requesting 1 year supply.   moxifloxacin (AVELOX) 400 MG tablet    Sig: Take  1 tablet (400 mg total) by mouth daily.    Dispense:  10 tablet    Refill:  0     Follow-up: Return in about 6 months (around 07/26/2023).  Mechele Claude, M.D.

## 2023-01-27 LAB — LIPID PANEL
Chol/HDL Ratio: 3.7 ratio (ref 0.0–4.4)
Cholesterol, Total: 122 mg/dL (ref 100–199)
HDL: 33 mg/dL — ABNORMAL LOW (ref >39)
LDL Chol Calc (NIH): 71 mg/dL (ref 0–99)
Triglycerides: 93 mg/dL (ref 0–149)
VLDL Cholesterol Cal: 18 mg/dL (ref 5–40)

## 2023-01-27 LAB — CBC WITH DIFFERENTIAL/PLATELET
Basophils Absolute: 0.1 10*3/uL (ref 0.0–0.2)
Basos: 1 %
EOS (ABSOLUTE): 0.3 10*3/uL (ref 0.0–0.4)
Eos: 2 %
Hematocrit: 48.4 % — ABNORMAL HIGH (ref 34.0–46.6)
Hemoglobin: 15.9 g/dL (ref 11.1–15.9)
Immature Grans (Abs): 0 10*3/uL (ref 0.0–0.1)
Immature Granulocytes: 0 %
Lymphocytes Absolute: 8.4 10*3/uL — ABNORMAL HIGH (ref 0.7–3.1)
Lymphs: 58 %
MCH: 29.6 pg (ref 26.6–33.0)
MCHC: 32.9 g/dL (ref 31.5–35.7)
MCV: 90 fL (ref 79–97)
Monocytes Absolute: 0.8 10*3/uL (ref 0.1–0.9)
Monocytes: 6 %
Neutrophils Absolute: 4.8 10*3/uL (ref 1.4–7.0)
Neutrophils: 33 %
Platelets: 242 10*3/uL (ref 150–450)
RBC: 5.38 x10E6/uL — ABNORMAL HIGH (ref 3.77–5.28)
RDW: 16.3 % — ABNORMAL HIGH (ref 11.7–15.4)
WBC: 14.5 10*3/uL — ABNORMAL HIGH (ref 3.4–10.8)

## 2023-01-27 LAB — CMP14+EGFR
ALT: 18 [IU]/L (ref 0–32)
AST: 20 [IU]/L (ref 0–40)
Albumin: 3.8 g/dL — ABNORMAL LOW (ref 3.9–4.9)
Alkaline Phosphatase: 49 [IU]/L (ref 44–121)
BUN/Creatinine Ratio: 18 (ref 12–28)
BUN: 19 mg/dL (ref 8–27)
Bilirubin Total: 0.7 mg/dL (ref 0.0–1.2)
CO2: 24 mmol/L (ref 20–29)
Calcium: 9.4 mg/dL (ref 8.7–10.3)
Chloride: 106 mmol/L (ref 96–106)
Creatinine, Ser: 1.03 mg/dL — ABNORMAL HIGH (ref 0.57–1.00)
Globulin, Total: 2.8 g/dL (ref 1.5–4.5)
Glucose: 74 mg/dL (ref 70–99)
Potassium: 4.9 mmol/L (ref 3.5–5.2)
Sodium: 143 mmol/L (ref 134–144)
Total Protein: 6.6 g/dL (ref 6.0–8.5)
eGFR: 59 mL/min/{1.73_m2} — ABNORMAL LOW (ref >59)

## 2023-01-28 NOTE — Progress Notes (Signed)
Hello Renie,  Your lab result is normal and/or stable.Some minor variations that are not significant are commonly marked abnormal, but do not represent any medical problem for you.  Best regards, Ismar Yabut, M.D.

## 2023-02-07 ENCOUNTER — Encounter (HOSPITAL_BASED_OUTPATIENT_CLINIC_OR_DEPARTMENT_OTHER): Payer: Self-pay

## 2023-02-07 ENCOUNTER — Other Ambulatory Visit (HOSPITAL_BASED_OUTPATIENT_CLINIC_OR_DEPARTMENT_OTHER): Payer: Self-pay

## 2023-02-09 ENCOUNTER — Other Ambulatory Visit (HOSPITAL_BASED_OUTPATIENT_CLINIC_OR_DEPARTMENT_OTHER): Payer: Self-pay

## 2023-02-11 ENCOUNTER — Encounter: Payer: Self-pay | Admitting: Pharmacist

## 2023-02-16 ENCOUNTER — Ambulatory Visit: Payer: Medicare Other | Admitting: Cardiology

## 2023-03-03 ENCOUNTER — Other Ambulatory Visit (HOSPITAL_BASED_OUTPATIENT_CLINIC_OR_DEPARTMENT_OTHER): Payer: Self-pay

## 2023-03-04 ENCOUNTER — Other Ambulatory Visit (HOSPITAL_BASED_OUTPATIENT_CLINIC_OR_DEPARTMENT_OTHER): Payer: Self-pay

## 2023-03-18 ENCOUNTER — Telehealth (HOSPITAL_COMMUNITY): Payer: Self-pay | Admitting: Pharmacist

## 2023-03-18 ENCOUNTER — Other Ambulatory Visit (HOSPITAL_BASED_OUTPATIENT_CLINIC_OR_DEPARTMENT_OTHER): Payer: Self-pay

## 2023-03-18 DIAGNOSIS — G4733 Obstructive sleep apnea (adult) (pediatric): Secondary | ICD-10-CM | POA: Diagnosis not present

## 2023-03-18 NOTE — Telephone Encounter (Signed)
 Patient left VM asking if her new insurance will cover Wegovy . Provided the following information:  ID: [id number] Group: [id number] Bin: [id number]  Routing to [facility] Pharmacists based off multiple MyChart messages from the past year indicating PA's would need to be submitted with new insurance after 03/16/23.  Tinnie Redman, PharmD, BCPS, BCCP, CPP Heart Failure Clinic Pharmacist [phone]

## 2023-03-20 NOTE — Progress Notes (Signed)
 Cardiology Office Note:   Date:  03/23/2023  ID:  ALIZAYA OSHEA, DOB [birth year], MRN [id number] PCP: Zollie Lowers, MD  [facility] Providers Cardiologist:  Lynwood Schilling, MD Electrophysiologist:  Will Gladis Norton, MD {  History of Present Illness:   CORTINA VULTAGGIO is a 68 y.o. female who presented for evaluation of CAD.   She has had atrial fib.  She wore a monitor and was noted to be persistent atrial fib with some tachybradycardia rates .  She was in the hospital with HF and rapid atrial fib and she had an EF of 30 - 35%.  She failed Tikosyn  and she was treated with amio.   I switched her to Entresto  at the last visit.  She saw Dr. Rolan who wanted to do right and left heart cath on her because of her slightly reduced ejection fraction PVCs.  However, this was denied by insurance.  He did increase carvedilol  when he saw her most recently in October 2024.  EF was improved to 45% on echo in Nov 2023.      Since she was last seen she has done well. The patient denies any new symptoms such as chest discomfort, neck or arm discomfort. There has been no new shortness of breath, PND or orthopnea. There have been no reported palpitations, presyncope or syncope.     ROS: As stated in the HPI and negative for all other systems.  Studies Reviewed:    EKG:   Atrial fibrillation, rate 86, left axis deviation, premature ventricular contraction, poor anterior R wave progression, 12/29/2022  Risk Assessment/Calculations:    CHA2DS2-VASc Score = 6   This indicates a 9.7% annual risk of stroke. The patient's score is based upon: CHF History: 1 HTN History: 1 Diabetes History: 1 Stroke History: 0 Vascular Disease History: 1 Age Score: 1 Gender Score: 1   Physical Exam:   VS:  BP 92/62   Pulse 80   Ht 5' 7 (1.702 m)   Wt 282 lb (127.9 kg)   BMI 44.17 kg/m    Wt Readings from Last 3 Encounters:  03/23/23 282 lb (127.9 kg)  01/26/23 291 lb (132 kg)  12/29/22 296 lb  9.6 oz (134.5 kg)     GEN: Well nourished, well developed in no acute distress NECK: No JVD; No carotid bruits CARDIAC:Irregular RR, no murmurs, rubs, gallops RESPIRATORY:  Clear to auscultation without rales, wheezing or rhonchi  ABDOMEN: Soft, non-tender, non-distended EXTREMITIES:  No edema; No deformity   ASSESSMENT AND PLAN:   CAD:     The patient has no new sypmtoms.  No further cardiovascular testing is indicated.  We will continue with aggressive risk reduction and meds as listed.   ATRIAL FIB:   Ms. JERALYN NOLDEN has a CHA2DS2 - VASc score of 4.  She tolerates anticoagulation.  No change in therapy.  She feels this once in a while.  She has already failed Tikosyn  and amiodarone .   HTN:  The blood pressure is being managed in the context of treating her cardiomyopathy.   DYSLIPIDEMIA:   LDL was 71 with an HDL of 33.  No change in therapy. 71.  No change in therapy.    SLEEP APNEA:    She uses CPAP.    CHRONIC SYSTOLIC HF:   I will repeat an echocardiogram.  I do not think that her blood pressure will allow med titration.  She seems to be euvolemic.  No change in therapy otherwise pending  the results of the echo.     Follow up with me in 6 months  Signed, Lynwood Schilling, MD

## 2023-03-23 ENCOUNTER — Ambulatory Visit: Payer: Medicare Other | Admitting: Cardiology

## 2023-03-23 ENCOUNTER — Encounter: Payer: Self-pay | Admitting: Cardiology

## 2023-03-23 VITALS — BP 92/62 | HR 80 | Ht 67.0 in | Wt 282.0 lb

## 2023-03-23 DIAGNOSIS — I2510 Atherosclerotic heart disease of native coronary artery without angina pectoris: Secondary | ICD-10-CM | POA: Diagnosis not present

## 2023-03-23 DIAGNOSIS — I5022 Chronic systolic (congestive) heart failure: Secondary | ICD-10-CM | POA: Diagnosis not present

## 2023-03-23 DIAGNOSIS — E785 Hyperlipidemia, unspecified: Secondary | ICD-10-CM

## 2023-03-23 DIAGNOSIS — I482 Chronic atrial fibrillation, unspecified: Secondary | ICD-10-CM

## 2023-03-23 DIAGNOSIS — I10 Essential (primary) hypertension: Secondary | ICD-10-CM

## 2023-03-23 NOTE — Patient Instructions (Signed)
 Medication Instructions:  The current medical regimen is effective;  continue present plan and medications.  *If you need a refill on your cardiac medications before your next appointment, please call your pharmacy*  Testing/Procedures: Your physician has requested that you have an echocardiogram. Echocardiography is a painless test that uses sound waves to create images of your heart. It provides your doctor with information about the size and shape of your heart and how well your heart's chambers and valves are working. This procedure takes approximately one hour. There are no restrictions for this procedure. Please do NOT wear cologne, perfume, aftershave, or lotions (deodorant is allowed). Please arrive 15 minutes prior to your appointment time.  Please note: We ask at that you not bring children with you during ultrasound (echo/ vascular) testing. Due to room size and safety concerns, children are not allowed in the ultrasound rooms during exams. Our front office staff cannot provide observation of children in our lobby area while testing is being conducted. An adult accompanying a patient to their appointment will only be allowed in the ultrasound room at the discretion of the ultrasound technician under special circumstances. We apologize for any inconvenience.   Follow-Up: At [facility], you and your health needs are our priority.  As part of our continuing mission to provide you with exceptional heart care, we have created designated Provider Care Teams.  These Care Teams include your primary Cardiologist (physician) and Advanced Practice Providers (APPs -  Physician Assistants and Nurse Practitioners) who all work together to provide you with the care you need, when you need it.  We recommend signing up for the patient portal called MyChart.  Sign up information is provided on this After Visit Summary.  MyChart is used to connect with patients for Virtual Visits (Telemedicine).   Patients are able to view lab/test results, encounter notes, upcoming appointments, etc.  Non-urgent messages can be sent to your provider as well.   To learn more about what you can do with MyChart, go to [email or web address].    Your next appointment:   6 month(s)  Provider:   Lynwood Schilling, MD

## 2023-03-24 ENCOUNTER — Other Ambulatory Visit (HOSPITAL_COMMUNITY): Payer: Self-pay

## 2023-03-24 ENCOUNTER — Encounter: Payer: Self-pay | Admitting: Family Medicine

## 2023-03-24 ENCOUNTER — Other Ambulatory Visit (HOSPITAL_BASED_OUTPATIENT_CLINIC_OR_DEPARTMENT_OTHER): Payer: Self-pay

## 2023-03-24 ENCOUNTER — Telehealth: Payer: Self-pay | Admitting: Pharmacy Technician

## 2023-03-24 NOTE — Telephone Encounter (Signed)
 Pharmacy Patient Advocate Encounter   Received notification from Patient Advice Request messages that prior authorization for wegovy  is required/requested.   Insurance verification completed.   The patient is insured through  [place]  .   Per test claim: The current 03/24/23 day co-pay is, $584.97 one month (DEDUCTIBLE).  No PA needed at this time. This test claim was processed through [facility]- copay amounts may vary at other pharmacies due to pharmacy/plan contracts, or as the patient moves through the different stages of their insurance plan.

## 2023-03-25 ENCOUNTER — Other Ambulatory Visit (HOSPITAL_COMMUNITY): Payer: Self-pay

## 2023-03-25 ENCOUNTER — Telehealth: Payer: Self-pay | Admitting: Pharmacy Technician

## 2023-03-25 NOTE — Telephone Encounter (Signed)
 Pharmacy Patient Advocate Encounter   Received notification from Pt Calls Messages that prior authorization for zepbound  is required/requested.   Insurance verification completed.   The patient is insured through [facility] .   Per test claim: PA required; PA submitted to above mentioned insurance via CoverMyMeds Key/confirmation #/EOC [facility] Status is pending

## 2023-03-25 NOTE — Telephone Encounter (Signed)
 Could we check Zepbound with OSA and see if that is cheaper

## 2023-03-25 NOTE — Telephone Encounter (Signed)
 Sent over appeal with documentation of OSA sleep pattern

## 2023-03-26 ENCOUNTER — Other Ambulatory Visit: Payer: Self-pay | Admitting: Family Medicine

## 2023-03-26 ENCOUNTER — Other Ambulatory Visit (HOSPITAL_BASED_OUTPATIENT_CLINIC_OR_DEPARTMENT_OTHER): Payer: Self-pay

## 2023-03-27 DIAGNOSIS — G4733 Obstructive sleep apnea (adult) (pediatric): Secondary | ICD-10-CM | POA: Diagnosis not present

## 2023-03-28 ENCOUNTER — Other Ambulatory Visit (HOSPITAL_COMMUNITY): Payer: Self-pay

## 2023-03-28 ENCOUNTER — Other Ambulatory Visit (HOSPITAL_BASED_OUTPATIENT_CLINIC_OR_DEPARTMENT_OTHER): Payer: Self-pay

## 2023-03-28 MED ORDER — ZEPBOUND 12.5 MG/0.5ML ~~LOC~~ SOAJ
12.5000 mg | SUBCUTANEOUS | 0 refills | Status: DC
  Filled 2023-03-28: qty 2, 28d supply, fill #0

## 2023-03-28 NOTE — Addendum Note (Signed)
 Addended by: Malena Peer D on: 03/28/2023 12:51 PM   Modules accepted: Orders

## 2023-03-29 ENCOUNTER — Other Ambulatory Visit (HOSPITAL_BASED_OUTPATIENT_CLINIC_OR_DEPARTMENT_OTHER): Payer: Self-pay

## 2023-03-29 ENCOUNTER — Other Ambulatory Visit (HOSPITAL_COMMUNITY): Payer: Self-pay

## 2023-03-29 ENCOUNTER — Encounter: Payer: Self-pay | Admitting: Pharmacist

## 2023-03-29 ENCOUNTER — Other Ambulatory Visit: Payer: Self-pay

## 2023-03-29 NOTE — Telephone Encounter (Signed)
 Pharmacy Patient Advocate Encounter  Received notification from [employer] that Prior Authorization for zepbound  has been APPROVED from 03/25/23 to 03/14/24. Ran test claim, Copay is $327.37 one month. This test claim was processed through [facility]- copay amounts may vary at other pharmacies due to pharmacy/plan contracts, or as the patient moves through the different stages of their insurance plan.   PA #/Case ID/Reference #: [id number]

## 2023-04-04 ENCOUNTER — Encounter (HOSPITAL_COMMUNITY): Payer: Medicare Other

## 2023-04-04 ENCOUNTER — Ambulatory Visit (HOSPITAL_COMMUNITY): Payer: Medicare Other

## 2023-04-04 ENCOUNTER — Ambulatory Visit (HOSPITAL_COMMUNITY)
Admission: RE | Admit: 2023-04-04 | Discharge: 2023-04-04 | Disposition: A | Discharge status: Home / Self Care | Payer: Medicare Other | Source: Ambulatory Visit | Attending: Family Medicine | Admitting: Family Medicine

## 2023-04-04 DIAGNOSIS — R9431 Abnormal electrocardiogram [ECG] [EKG]: Secondary | ICD-10-CM | POA: Insufficient documentation

## 2023-04-04 DIAGNOSIS — I482 Chronic atrial fibrillation, unspecified: Secondary | ICD-10-CM | POA: Diagnosis not present

## 2023-04-04 DIAGNOSIS — I110 Hypertensive heart disease with heart failure: Secondary | ICD-10-CM | POA: Insufficient documentation

## 2023-04-04 DIAGNOSIS — I252 Old myocardial infarction: Secondary | ICD-10-CM | POA: Diagnosis not present

## 2023-04-04 DIAGNOSIS — I2510 Atherosclerotic heart disease of native coronary artery without angina pectoris: Secondary | ICD-10-CM | POA: Insufficient documentation

## 2023-04-04 DIAGNOSIS — I5022 Chronic systolic (congestive) heart failure: Secondary | ICD-10-CM | POA: Diagnosis not present

## 2023-04-04 DIAGNOSIS — I3481 Nonrheumatic mitral (valve) annulus calcification: Secondary | ICD-10-CM | POA: Diagnosis not present

## 2023-04-04 LAB — ECHOCARDIOGRAM COMPLETE
AR max vel: 2.27 cm2
AV Area VTI: 2.2 cm2
AV Area mean vel: 2.47 cm2
AV Mean grad: 5 mm[Hg]
AV Peak grad: 10.4 mm[Hg]
Ao pk vel: 1.61 m/s
Area-P 1/2: 3.76 cm2
S' Lateral: 3.9 cm

## 2023-04-06 ENCOUNTER — Encounter: Payer: Self-pay | Admitting: Cardiology

## 2023-04-12 ENCOUNTER — Other Ambulatory Visit: Payer: Self-pay

## 2023-04-14 ENCOUNTER — Telehealth: Payer: Self-pay | Admitting: Family Medicine

## 2023-04-14 NOTE — Telephone Encounter (Signed)
Pt aware of no outgoing call from our office.  Copied from CRM [id number]. Topic: General - Other >> Apr 14, 2023  3:20 PM Fredrica W wrote: Reason for CRM: Patient called states she missed a call but there was no message. It was today and she does not know if it is concerning her or her husband. Please call patient back if needed. Thank You

## 2023-04-17 ENCOUNTER — Other Ambulatory Visit (HOSPITAL_COMMUNITY): Payer: Self-pay | Admitting: Cardiology

## 2023-04-22 ENCOUNTER — Other Ambulatory Visit (HOSPITAL_BASED_OUTPATIENT_CLINIC_OR_DEPARTMENT_OTHER): Payer: Self-pay

## 2023-04-22 MED ORDER — ZEPBOUND 15 MG/0.5ML ~~LOC~~ SOAJ
15.0000 mg | SUBCUTANEOUS | 11 refills | Status: AC
  Filled 2023-04-22: qty 2, 28d supply, fill #0
  Filled 2023-05-26: qty 2, 28d supply, fill #1
  Filled 2023-06-20 – 2023-06-22 (×4): qty 2, 28d supply, fill #2
  Filled 2023-07-19 – 2023-07-23 (×3): qty 2, 28d supply, fill #3
  Filled 2023-08-19: qty 2, 28d supply, fill #4
  Filled 2023-09-19: qty 2, 28d supply, fill #5
  Filled 2023-10-11: qty 2, 28d supply, fill #6
  Filled 2023-11-16: qty 2, 28d supply, fill #7
  Filled 2023-12-06 – 2023-12-07 (×2): qty 2, 28d supply, fill #8
  Filled 2024-01-04: qty 2, 28d supply, fill #9
  Filled 2024-01-30: qty 2, 28d supply, fill #10

## 2023-04-22 NOTE — Addendum Note (Signed)
 Addended by: Elzada Pytel D on: 04/22/2023 01:10 PM   Modules accepted: Orders

## 2023-04-28 ENCOUNTER — Other Ambulatory Visit (HOSPITAL_BASED_OUTPATIENT_CLINIC_OR_DEPARTMENT_OTHER): Payer: Self-pay

## 2023-04-29 ENCOUNTER — Encounter: Payer: Self-pay | Admitting: Cardiology

## 2023-05-04 ENCOUNTER — Encounter: Payer: Self-pay | Admitting: Family Medicine

## 2023-05-06 NOTE — Telephone Encounter (Signed)
Paperwork has been printed. Will put on Dr Darlyn Read desk to sign and fax back to [facility] before 05/13/23. Pt says she wants someone to call her to let her know when it has been filled out and faxed.

## 2023-05-09 ENCOUNTER — Encounter: Payer: Self-pay | Admitting: Family Medicine

## 2023-05-10 ENCOUNTER — Telehealth: Payer: Self-pay | Admitting: Hematology and Oncology

## 2023-05-13 ENCOUNTER — Other Ambulatory Visit: Payer: Self-pay | Admitting: Family Medicine

## 2023-05-13 DIAGNOSIS — E782 Mixed hyperlipidemia: Secondary | ICD-10-CM

## 2023-05-26 ENCOUNTER — Other Ambulatory Visit (HOSPITAL_COMMUNITY): Payer: Self-pay

## 2023-06-02 ENCOUNTER — Other Ambulatory Visit (HOSPITAL_BASED_OUTPATIENT_CLINIC_OR_DEPARTMENT_OTHER): Payer: Self-pay

## 2023-06-02 DIAGNOSIS — G4733 Obstructive sleep apnea (adult) (pediatric): Secondary | ICD-10-CM | POA: Diagnosis not present

## 2023-06-03 DIAGNOSIS — G4733 Obstructive sleep apnea (adult) (pediatric): Secondary | ICD-10-CM | POA: Diagnosis not present

## 2023-06-07 ENCOUNTER — Telehealth: Payer: Self-pay | Admitting: Cardiology

## 2023-06-07 ENCOUNTER — Encounter: Payer: Self-pay | Admitting: Family Medicine

## 2023-06-07 ENCOUNTER — Telehealth: Payer: Self-pay | Admitting: Family Medicine

## 2023-06-07 MED ORDER — DAPAGLIFLOZIN PROPANEDIOL 10 MG PO TABS
10.0000 mg | ORAL_TABLET | Freq: Every day | ORAL | 3 refills | Status: AC
Start: 2023-06-07 — End: ?

## 2023-06-07 MED ORDER — DAPAGLIFLOZIN PROPANEDIOL 10 MG PO TABS: 10.0000 mg | ORAL_TABLET | Freq: Every day | ORAL | 3 refills | Status: DC

## 2023-06-07 NOTE — Telephone Encounter (Signed)
 Copied from CRM [id number]. Topic: General - Other >> Jun 07, 2023 10:18 AM Emylou G wrote: Reason for CRM: Kenyatta w/[facility] faxed over written order today and needs face to face notes from this year.. needs a sleep study included.. her number [phone] fax [phone]

## 2023-06-07 NOTE — Telephone Encounter (Signed)
*  STAT* If patient is at the pharmacy, call can be transferred to refill team.   1. Which medications need to be refilled? (please list name of each medication and dose if known) new written prescription for Farxiga   2. Would you like to learn more about the convenience, safety, & potential cost savings by using the [facility]?      3. Are you open to using the [facility] (Type [facility]. .   4. Which pharmacy/location (including street and city if local pharmacy) is medication to be sent to?[facility]  Fax# [phone]  5. Do they need a 30 day or 90 day supply? 90 days and refills

## 2023-06-07 NOTE — Telephone Encounter (Signed)
I did not need this encounter. °

## 2023-06-07 NOTE — Telephone Encounter (Signed)
 Called and spoke to pt who states she was approved for the pt assistance from [employer] and needs a RX sent to them. FAX [phone].    RX for dapagliflozin propanediol (FARXIGA) 10 mg one tablet, once daily sent to [facility], [place] [facility], [address].

## 2023-06-09 ENCOUNTER — Ambulatory Visit: Payer: Medicare Other | Admitting: Hematology and Oncology

## 2023-06-09 ENCOUNTER — Other Ambulatory Visit: Payer: Medicare Other

## 2023-06-09 DIAGNOSIS — G4733 Obstructive sleep apnea (adult) (pediatric): Secondary | ICD-10-CM | POA: Diagnosis not present

## 2023-06-10 NOTE — Telephone Encounter (Signed)
 Information and order faxed back yesterday

## 2023-06-14 ENCOUNTER — Other Ambulatory Visit: Payer: Self-pay | Admitting: Hematology and Oncology

## 2023-06-14 DIAGNOSIS — D72820 Lymphocytosis (symptomatic): Secondary | ICD-10-CM

## 2023-06-14 NOTE — Progress Notes (Unsigned)
 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 Leonides Schanz  06/09/2022: WBC 13.8, Hgb 14.7, MCV 45.3, Plt 264 06/15/2023: WBC 18.9, Hgb 15.1, MCV 92.4, Plt 230    Interval History:  Julia West 68 y.o. female with medical history significant for monoclonal B Cell lymphocytosis/leukocytosis who presents for a follow up visit. The patient's last visit was on 06/09/2022. In the interim since the last visit she has had no major changes in her health.  On exam today Julia West reports that she has been all right in the interim since our last visit.  She reports that she has had no recent infectious symptoms such as fevers, chills, sweats.  She reports that she is currently on a GLP-1 shot and is lost 52 pounds.  She is delighted by her weight loss results but has goals of losing more weight.  She is at 278 pounds today and was at 291 and she reports her energy levels have been "so-so".  She reports about a 5 out of 10.  She notes that she is not having any issues concerning for progression of her CLL such as B symptoms.  Otherwise she is not having any concerning findings such as lymphadenopathy, fevers, chills, sweats, or unexpected weight loss.  She has had no infectious symptoms recently.  A full 10 point ROS is otherwise negative.  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    S/p CABG x 2 in 2003   Cancer [facility])    basal cell face   Complication of  anesthesia    states low O2 sats post-op 11/13, always slow to awaken   Degenerative joint disease    Dehiscence of amputation stump (HCC)    left BKA   Depression    Fatty liver    HTN (hypertension)    Hyperlipidemia LDL goal <70    Incarcerated hernia    Internal and external hemorrhoids without complication    Lymphedema of arm    right   Morbid obesity (HCC)    OSA on CPAP    severe obstructive sleep apnea with an AHI of 68.8/h and O2 saturations as low as 69% meeting criteria for nocturnal hypoxemia with O2 saturations less than 88% for 272 minutes.  On CPAP at 8cm H2O   Rheumatoid arthritis (HCC)    Surgical wound, non healing ABDOMINAL    has wound vac @ 125 mm Hg   Ventral hernia     SURGICAL HISTORY: Past Surgical History:  Procedure Laterality Date   AMPUTATION Left 05/06/2017   Procedure: BELOW KNEE AMPUTATION;  Surgeon: Nadara Mustard, MD;  Location: [facility] OR;  Service: Orthopedics;  Laterality: Left;   APPLICATION OF WOUND VAC     BOWEL RESECTION  02/07/2012   Procedure: SMALL BOWEL RESECTION;  Surgeon: [name], MD,FACS;  Location: [facility] OR;  Service: General;;   CARDIOVASCULAR STRESS TEST  01-17-2012  DR HOCHREIN   LOW RISK NUCLEAR TEST   CARDIOVERSION N/A 07/14/2020   Procedure: CARDIOVERSION;  Surgeon: Wyline Mood,  Dorothe Pea, MD;  Location: [facility] ORS;  Service: Endoscopy;  Laterality: N/A;   CARDIOVERSION N/A 09/22/2021   Procedure: CARDIOVERSION;  Surgeon: Jake Bathe, MD;  Location: [facility] END[facility]COPY;  Service: Cardiovascular;  Laterality: N/A;   CARDIOVERSION N/A 12/30/2021   Procedure: CARDIOVERSION;  Surgeon: Sande Rives, MD;  Location: [facility] ENDOSCOPY;  Service: Cardiovascular;  Laterality: N/A;   CESAREAN SECTION     x 4 in remote past   CHOLECYSTECTOMY     CORONARY ARTERY BYPASS GRAFT  2003   by Dr. Tyrone Sage. LIMA to the LAD, free RIMA to the circumflex. Stress perfusion study December 2009 with no high-risk areas of ischemia. She has a well-preserved  ejection fraction   HYSTEROSCOPY WITH D & C N/A 05/14/2014   Procedure: DILATATION AND CURETTAGE (no specimen); HYSTEROSCOPY;  Surgeon: Tilda Burrow, MD;  Location: [facility];  Service: Gynecology;  Laterality: N/A;   I & D EXTREMITY Left 06/21/2017   Procedure: IRRIGATION AND DEBRIDEMENT OF LEFT LEG AMPUTATION SITE;  Surgeon: Peggye Form, DO;  Location: [facility];  Service: Plastics;  Laterality: Left;   INCISION AND DRAINAGE OF WOUND  04/17/2012   Procedure: IRRIGATION AND DEBRIDEMENT WOUND;  Surgeon: Wayland Denis, DO;  Location: [facility] [place];  Service: Plastics;  Laterality: N/A;  OF ABDOMINAL WOUND, SURGICAL PREP AND PLACEMENT OF VAC, REMOVAL FOREHEAD SKIN LESION   INCISION AND DRAINAGE OF WOUND N/A 04/24/2012   Procedure: IRRIGATION AND DEBRIDEMENT WOUND;  Surgeon: Wayland Denis, DO;  Location: [facility];  Service: Plastics;  Laterality: N/A;  I & D ABDOMINAL WOUND WITH VAC AND ACELL   INCISION AND DRAINAGE OF WOUND N/A 05/01/2012   Procedure: IRRIGATION AND DEBRIDEMENT WOUND;  Surgeon: Wayland Denis, DO;  Location: [facility];  Service: Plastics;  Laterality: N/A;  WITH SURGICAL PREP AND PLACEMENT OF VAC   INCISION AND DRAINAGE OF WOUND N/A 05/08/2012   Procedure: IRRIGATION AND DEBRIDEMENT OF ABD WOUND SURGICAL PREP AND PLACEMENT OF VAC ;  Surgeon: Wayland Denis, DO;  Location: [facility];  Service: Plastics;  Laterality: N/A;  IRRIGATION AND DEBRIDEMENT OF ABD WOUND SURGICAL PREP AND PLACEMENT OF VAC    INCISION AND DRAINAGE OF WOUND N/A 05/15/2012   Procedure: IRRIGATION AND DEBRIDEMENT OF ABDOMINAL WOUND WITH POSSIBLE SURGICAL PREP AND PLACEMENT OF VAC;  Surgeon: Wayland Denis, DO;  Location: [facility];  Service: Plastics;  Laterality: N/A;   INCISION AND DRAINAGE OF WOUND N/A 05/22/2012   Procedure: IRRIGATION AND DEBRIDEMENT OF ABODOMINAL WOUND WITH  SURGICAL PREP AND VAC PLACEMENT;  Surgeon: Wayland Denis, DO;   Location: [facility];  Service: Plastics;  Laterality: N/A;   INCISION AND DRAINAGE OF WOUND N/A 06/28/2012   Procedure: IRRIGATION AND DEBRIDEMENT OF ABDOMINAL ULCER SURGICAL PREP AND PLACEMENT OF ACELL AND VAC;  Surgeon: Wayland Denis, DO;  Location: [facility];  Service: Plastics;  Laterality: N/A;   INCISIONAL HERNIA REPAIR  02/07/2012   Procedure: HERNIA REPAIR INCISIONAL;  Surgeon: [name], MD,FACS;  Location: [facility];  Service: General;;  Open, Primary repair, strangulated Incisional hernia.   LESION REMOVAL  04/17/2012   Procedure: LESION REMOVAL;  Surgeon: Wayland Denis, DO;  Location: [facility] [place];  Service: Plastics;  Laterality: N/A;   CENTER OF FOREHEAD, REMOVAL FORHEAD SKIN LESION   ORIF TOE FRACTURE Left 02/09/2017   Procedure: EXCISION TALAR HEAD, INTERNAL FIXATION MEDIAL COLUMN LEFT FOOT;  Surgeon: Nadara Mustard,  MD;  Location: [facility];  Service: Orthopedics;  Laterality: Left;   POLYPECTOMY N/A 05/14/2014   Procedure: ENDOMETRIAL POLYPECTOMY;  Surgeon: Tilda Burrow, MD;  Location: [facility];  Service: Gynecology;  Laterality: N/A;   STUMP REVISION Left 08/31/2017   Procedure: REVISION LEFT BELOW KNEE AMPUTATION;  Surgeon: Nadara Mustard, MD;  Location: [facility] OR;  Service: Orthopedics;  Laterality: Left;    SOCIAL HISTORY: Social History   Socioeconomic History   Marital status: Married    Spouse name: Renae Fickle   Number of children: 5   Years of education: Not on file   Highest education level: 12th grade  Occupational History   Occupation: Facilities manager    Comment: In the [employer]   Occupation: retired  Tobacco Use   Smoking status: Former    Current packs/day: 0.00    Average packs/day: 1 pack/day for 25.0 years (25.0 ttl pk-yrs)    Types: Cigarettes    Start date: 11/17/1986    Quit date: 11/17/2011    Years since quitting: 11.5    Passive exposure: Current   Smokeless tobacco: Never  Vaping Use   Vaping status: Never Used   Substance and Sexual Activity   Alcohol use: No   Drug use: No   Sexual activity: Yes    Birth control/protection: Post-menopausal  Other Topics Concern   Not on file  Social History Narrative   Lives in [place] with her husband. 5 children. One of whom has pulmonary stenosis.    Left BKA, uses crutches - doesn't get much exercise   Social Drivers of Health   Financial Resource Strain: Low Risk  (01/25/2023)   Overall Financial Resource Strain (CARDIA)    Difficulty of Paying Living Expenses: Not hard at all  Food Insecurity: No Food Insecurity (01/25/2023)   Hunger Vital Sign    Worried About Running Out of Food in the Last Year: Never true    Ran Out of Food in the Last Year: Never true  Transportation Needs: No Transportation Needs (01/25/2023)   PRAPARE - Administrator, Civil Service (Medical): No    Lack of Transportation (Non-Medical): No  Physical Activity: Unknown (01/25/2023)   Exercise Vital Sign    Days of Exercise per Week: 0 days    Minutes of Exercise per Session: Not on file  Recent Concern: Physical Activity - Inactive (01/25/2023)   Exercise Vital Sign    Days of Exercise per Week: 0 days    Minutes of Exercise per Session: 30 min  Stress: No Stress Concern Present (01/25/2023)   [employer] of Occupational Health - Occupational Stress Questionnaire    Feeling of Stress : Not at all  Social Connections: Moderately Isolated (01/25/2023)   Social Connection and Isolation Panel [NHANES]    Frequency of Communication with Friends and Family: More than three times a week    Frequency of Social Gatherings with Friends and Family: Three times a week    Attends Religious Services: Never    Active Member of Clubs or Organizations: No    Attends Banker Meetings: Not on file    Marital Status: Married  Intimate Partner Violence: Not At Risk (06/10/2022)   Humiliation, Afraid, Rape, and Kick questionnaire    Fear of Current or  Ex-Partner: No    Emotionally Abused: No    Physically Abused: No    Sexually Abused: No    FAMILY HISTORY: Family History  Problem Relation Age of Onset  Heart attack Mother    Hypertension Mother    Heart disease Mother    Sudden death Mother    Obesity Mother    Hypertension Sister    Diabetes Brother    Birth defects Son        heart defect    ALLERGIES:  is allergic to ace inhibitors.  MEDICATIONS:  Current Outpatient Medications  Medication Sig Dispense Refill   apixaban (ELIQUIS) 5 MG TABS tablet Take 1 tablet (5 mg total) by mouth 2 (two) times daily. 180 tablet 1   atorvastatin (LIPITOR) 20 MG tablet Take 1 tablet (20 mg total) by mouth daily. 100 tablet 3   carvedilol (COREG) 12.5 MG tablet Take 1.5 tablets (18.75 mg total) by mouth 2 (two) times daily. 180 tablet 3   cetirizine (ZYRTEC) 10 MG tablet Take 1 tablet (10 mg total) by mouth daily as needed for allergies. 90 tablet 3   dapagliflozin propanediol (FARXIGA) 10 MG TABS tablet Take 1 tablet (10 mg total) by mouth daily before breakfast. 90 tablet 3   fenofibrate 160 MG tablet TAKE 1 TABLET BY MOUTH DAILY 100 tablet 0   fluticasone (FLONASE) 50 MCG/ACT nasal spray Place 1 spray into both nostrils daily as needed for allergies or rhinitis. 47.4 mL 3   furosemide (LASIX) 20 MG tablet Take 20 mg by mouth every other day.     moxifloxacin (AVELOX) 400 MG tablet Take 1 tablet (400 mg total) by mouth daily. 10 tablet 0   nitroGLYCERIN (NITROSTAT) 0.4 MG SL tablet Place 1 tablet (0.4 mg total) under the tongue every 5 (five) minutes x 3 doses as needed for chest pain. If pain persist after call 911 25 tablet 4   sacubitril-valsartan (ENTRESTO) 49-51 MG Take 1 tablet by mouth 2 (two) times daily. 180 tablet 3   sertraline (ZOLOFT) 50 MG tablet TAKE 1 TABLET BY MOUTH DAILY 100 tablet 1   sodium chloride (OCEAN) 0.65 % SOLN nasal spray Place 2 sprays into both nostrils daily. As needed     spironolactone (ALDACTONE) 25 MG  tablet TAKE 1 TABLET BY MOUTH DAILY 100 tablet 2   tirzepatide (ZEPBOUND) 15 MG/0.5ML Pen Inject 15 mg into the skin once a week. 2 mL 11   No current facility-administered medications for this visit.    REVIEW OF SYSTEMS:   Constitutional: ( - ) fevers, ( - )  chills , ( - ) night sweats Eyes: ( - ) blurriness of vision, ( - ) double vision, ( - ) watery eyes Ears, nose, mouth, throat, and face: ( - ) mucositis, ( - ) sore throat Respiratory: ( - ) cough, ( - ) dyspnea, ( - ) wheezes Cardiovascular: ( - ) palpitation, ( - ) chest discomfort, ( - ) lower extremity swelling Gastrointestinal:  ( - ) nausea, ( - ) heartburn, ( - ) change in bowel habits Skin: ( - ) abnormal skin rashes Lymphatics: ( - ) new lymphadenopathy, ( - ) easy bruising Neurological: ( - ) numbness, ( - ) tingling, ( - ) new weaknesses Behavioral/Psych: ( - ) mood change, ( - ) new changes  All other systems were reviewed with the patient and are negative.  PHYSICAL EXAMINATION:  Vitals:   06/15/23 1038  BP: 102/65  Pulse: 91  Resp: 16  Temp: (!) 96.4 F (35.8 C)  SpO2: 97%    Filed Weights   06/15/23 1038  Weight: 278 lb 12.8 oz (126.5 kg)  GENERAL: Well-appearing elderly Caucasian female, alert, no distress and comfortable SKIN: skin color, texture, turgor are normal, no rashes or significant lesions EYES: conjunctiva are pink and non-injected, sclera clear LUNGS: clear to auscultation and percussion with normal breathing effort HEART: regular rate & rhythm and no murmurs and no lower extremity edema Musculoskeletal: no cyanosis of digits and no clubbing  PSYCH: alert & oriented x 3, fluent speech NEURO: no focal motor/sensory deficits  LABORATORY DATA:  I have reviewed the data as listed    Latest Ref Rng & Units 06/15/2023   10:07 AM 01/26/2023   10:48 AM 08/27/2022    7:27 AM  CBC  WBC 4.0 - 10.5 K/uL 18.9  14.5  13.4   Hemoglobin 12.0 - 15.0 g/dL 60.4  54.0  98.1   Hematocrit 36.0 -  46.0 % 46.5  48.4  45.0   Platelets 150 - 400 K/uL 230  242  240        Latest Ref Rng & Units 06/15/2023   10:07 AM 01/26/2023   10:48 AM 12/29/2022   12:41 PM  CMP  Glucose 70 - 99 mg/dL 63  74  90   BUN 8 - 23 mg/dL 23  19  16    Creatinine 0.44 - 1.00 mg/dL 1.91  4.78  2.95   Sodium 135 - 145 mmol/L 141  143  143   Potassium 3.5 - 5.1 mmol/L 4.1  4.9  4.7   Chloride 98 - 111 mmol/L 107  106  109   CO2 22 - 32 mmol/L 27  24  26    Calcium 8.9 - 10.3 mg/dL 9.1  9.4  9.4   Total Protein 6.5 - 8.1 g/dL 7.0  6.6    Total Bilirubin 0.0 - 1.2 mg/dL 0.7  0.7    Alkaline Phos 38 - 126 U/L 51  49    AST 15 - 41 U/L 16  20    ALT 0 - 44 U/L 12  18      RADIOGRAPHIC STUDIES: No results found.  ASSESSMENT & PLAN Julia West 68 y.o. female with medical history significant for leukocytosis who presents for a follow up visit.  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 -- labs today show WBC 18.9, Hgb 15.1, MCV 92.4, Plt 230  -- Discussed with her today the need for continued monitoring of her monoclonal B-cell lymphocytosis and the likely treatment options if she were to develop symptomatic lymphadenopathy, or cytopenias. -- Plan to see the patient back with labs in 1 years time.  She gets frequent labs with other providers.  If she were found to have concerning abnormalities would recommend that she promptly be referred back to our clinic.  No orders of the defined types were placed in this encounter.   All questions were answered. The patient knows to call the clinic with any problems, questions or concerns.  A total of more than 25 minutes were spent on this encounter with face-to-face time and non-face-to-face time, including preparing to see the patient, ordering tests and/or medications, counseling the patient and  coordination of care as outlined above.   Ulysees Barns, MD Department of Hematology/Oncology [facility] Phone: [phone] Pager: [phone] Email: [email or web address]  06/15/2023 3:47 PM

## 2023-06-15 ENCOUNTER — Inpatient Hospital Stay: Payer: Medicare Other | Admitting: Hematology and Oncology

## 2023-06-15 ENCOUNTER — Other Ambulatory Visit: Payer: Self-pay

## 2023-06-15 ENCOUNTER — Inpatient Hospital Stay: Payer: Medicare Other | Attending: Hematology and Oncology

## 2023-06-15 VITALS — BP 102/65 | HR 91 | Temp 96.4°F | Resp 16 | Wt 278.8 lb

## 2023-06-15 DIAGNOSIS — D72820 Lymphocytosis (symptomatic): Secondary | ICD-10-CM

## 2023-06-15 DIAGNOSIS — Z79899 Other long term (current) drug therapy: Secondary | ICD-10-CM | POA: Insufficient documentation

## 2023-06-15 DIAGNOSIS — D72829 Elevated white blood cell count, unspecified: Secondary | ICD-10-CM | POA: Diagnosis not present

## 2023-06-15 LAB — CBC WITH DIFFERENTIAL (CANCER CENTER ONLY)
Abs Immature Granulocytes: 0.05 10*3/uL (ref 0.00–0.07)
Basophils Absolute: 0.1 10*3/uL (ref 0.0–0.1)
Basophils Relative: 1 %
Eosinophils Absolute: 0.2 10*3/uL (ref 0.0–0.5)
Eosinophils Relative: 1 %
HCT: 46.5 % — ABNORMAL HIGH (ref 36.0–46.0)
Hemoglobin: 15.1 g/dL — ABNORMAL HIGH (ref 12.0–15.0)
Immature Granulocytes: 0 %
Lymphocytes Relative: 67 %
Lymphs Abs: 12.8 10*3/uL — ABNORMAL HIGH (ref 0.7–4.0)
MCH: 30 pg (ref 26.0–34.0)
MCHC: 32.5 g/dL (ref 30.0–36.0)
MCV: 92.4 fL (ref 80.0–100.0)
Monocytes Absolute: 0.8 10*3/uL (ref 0.1–1.0)
Monocytes Relative: 5 %
Neutro Abs: 4.9 10*3/uL (ref 1.7–7.7)
Neutrophils Relative %: 26 %
Platelet Count: 230 10*3/uL (ref 150–400)
RBC: 5.03 MIL/uL (ref 3.87–5.11)
RDW: 16.4 % — ABNORMAL HIGH (ref 11.5–15.5)
Smear Review: NORMAL
WBC Count: 18.9 10*3/uL — ABNORMAL HIGH (ref 4.0–10.5)
nRBC: 0 % (ref 0.0–0.2)

## 2023-06-15 LAB — CMP (CANCER CENTER ONLY)
ALT: 12 U/L (ref 0–44)
AST: 16 U/L (ref 15–41)
Albumin: 3.9 g/dL (ref 3.5–5.0)
Alkaline Phosphatase: 51 U/L (ref 38–126)
Anion gap: 7 (ref 5–15)
BUN: 23 mg/dL (ref 8–23)
CO2: 27 mmol/L (ref 22–32)
Calcium: 9.1 mg/dL (ref 8.9–10.3)
Chloride: 107 mmol/L (ref 98–111)
Creatinine: 1.08 mg/dL — ABNORMAL HIGH (ref 0.44–1.00)
GFR, Estimated: 55 mL/min — ABNORMAL LOW (ref >60)
Glucose, Bld: 63 mg/dL — ABNORMAL LOW (ref 70–99)
Potassium: 4.1 mmol/L (ref 3.5–5.1)
Sodium: 141 mmol/L (ref 135–145)
Total Bilirubin: 0.7 mg/dL (ref 0.0–1.2)
Total Protein: 7 g/dL (ref 6.5–8.1)

## 2023-06-15 LAB — LACTATE DEHYDROGENASE: LDH: 158 U/L (ref 98–192)

## 2023-06-20 ENCOUNTER — Other Ambulatory Visit (HOSPITAL_BASED_OUTPATIENT_CLINIC_OR_DEPARTMENT_OTHER): Payer: Self-pay

## 2023-06-21 ENCOUNTER — Other Ambulatory Visit (HOSPITAL_BASED_OUTPATIENT_CLINIC_OR_DEPARTMENT_OTHER): Payer: Self-pay

## 2023-06-22 ENCOUNTER — Other Ambulatory Visit (HOSPITAL_BASED_OUTPATIENT_CLINIC_OR_DEPARTMENT_OTHER): Payer: Self-pay

## 2023-07-19 ENCOUNTER — Other Ambulatory Visit (HOSPITAL_BASED_OUTPATIENT_CLINIC_OR_DEPARTMENT_OTHER): Payer: Self-pay

## 2023-07-20 ENCOUNTER — Ambulatory Visit (INDEPENDENT_AMBULATORY_CARE_PROVIDER_SITE_OTHER): Admitting: Family Medicine

## 2023-07-20 ENCOUNTER — Encounter: Payer: Self-pay | Admitting: Family Medicine

## 2023-07-20 VITALS — BP 88/54 | HR 80 | Temp 98.0°F | Ht 67.0 in | Wt 273.0 lb

## 2023-07-20 DIAGNOSIS — D72829 Elevated white blood cell count, unspecified: Secondary | ICD-10-CM

## 2023-07-20 DIAGNOSIS — E66813 Obesity, class 3: Secondary | ICD-10-CM

## 2023-07-20 DIAGNOSIS — I10 Essential (primary) hypertension: Secondary | ICD-10-CM | POA: Diagnosis not present

## 2023-07-20 DIAGNOSIS — I5023 Acute on chronic systolic (congestive) heart failure: Secondary | ICD-10-CM | POA: Diagnosis not present

## 2023-07-20 DIAGNOSIS — I5022 Chronic systolic (congestive) heart failure: Secondary | ICD-10-CM

## 2023-07-20 DIAGNOSIS — E782 Mixed hyperlipidemia: Secondary | ICD-10-CM | POA: Diagnosis not present

## 2023-07-20 DIAGNOSIS — F5101 Primary insomnia: Secondary | ICD-10-CM

## 2023-07-20 DIAGNOSIS — Z6841 Body Mass Index (BMI) 40.0 and over, adult: Secondary | ICD-10-CM

## 2023-07-20 DIAGNOSIS — E11610 Type 2 diabetes mellitus with diabetic neuropathic arthropathy: Secondary | ICD-10-CM | POA: Diagnosis not present

## 2023-07-20 LAB — LIPID PANEL

## 2023-07-20 LAB — CBC WITH DIFFERENTIAL/PLATELET

## 2023-07-20 LAB — BAYER DCA HB A1C WAIVED: HB A1C (BAYER DCA - WAIVED): 5.1 % (ref 4.8–5.6)

## 2023-07-20 MED ORDER — FENOFIBRATE 160 MG PO TABS: 160.0000 mg | ORAL_TABLET | Freq: Every day | ORAL | 0 refills | Status: DC

## 2023-07-20 MED ORDER — ACETAMINOPHEN 500 MG PO TABS: 1000.0000 mg | ORAL_TABLET | Freq: Three times a day (TID) | ORAL | Status: AC

## 2023-07-20 MED ORDER — SERTRALINE HCL 50 MG PO TABS: 50.0000 mg | ORAL_TABLET | Freq: Every day | ORAL | 1 refills | Status: DC

## 2023-07-20 NOTE — Progress Notes (Signed)
 Subjective:  Patient ID: Julia West, female    DOB: [birth year]  Age: 68 y.o. MRN: [id number]  CC: Medical Management of Chronic Issues, allergies (Takes zyrtec  daily but still having runny nose and sneezing. ), and Hand Pain (Middle finger on left hand. Gets locked up and it is sore when she has to manually straighten it. Started 2 weeks ago. )   HPI RANETTA LEMERISE presents for patient has allergic rhinitis symptoms including sneezing frequently sniffling, clear rhinorrhea, watery and itchy eyes. There has been no fever no chills no sweats. No earaches. There is some scratchy throat but no sore throat or difficulty swallowing. There is some nasal congestion.   Middle finger of the left hand pops and locks up and she has to straighten it out using the other hand.  Pain is manageable  She is being followed by hematology due to leukocytosis that may be a precursor to leukemia.  They are monitoring her white count and if it doubles there is a plan to initiate treatment potentially.  Patient in for follow-up of elevated cholesterol. Doing well without complaints on current medication. Denies side effects of statin including myalgia and arthralgia and nausea. Also in today for liver function testing. Currently no chest pain, shortness of breath or other cardiovascular related symptoms noted.   Follow-up of hypertension. Patient has no history of headache chest pain or shortness of breath or recent cough. Patient also denies symptoms of TIA such as numbness weakness lateralizing. Patient checks  blood pressure at home and has not had any elevated readings recently. Patient denies side effects from his medication. States taking it regularly.  She continues to have problems getting to sleep.  Currently just using over the counter medications.  No intervention requested/desired.  Patient has history of congestive heart failure at this time she is taking Farxiga  Entresto  Lasix  and  spironolactone  and has not had any exacerbation recently.  For her obesity she continues to take tirzepatide /Zepbound .  This also has activity to help with prevention of acute exacerbations of heart failure.    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   She has a past surgical history that includes Cesarean section; Cholecystectomy; Coronary artery bypass graft (2003); Incisional hernia repair (02/07/2012); Bowel resection (02/07/2012); Application if wound vac; Incision and drainage of wound (04/17/2012); Lesion removal (04/17/2012); Incision and drainage of wound (N/A, 04/24/2012); Incision and drainage of wound (N/A, 05/01/2012); Incision and drainage of wound (N/A, 05/08/2012); Cardiovascular  stress test (01-17-2012  [facility]); Incision and drainage of wound (N/A, 05/15/2012); Incision and drainage of wound (N/A, 05/22/2012); Incision and drainage of wound (N/A, 06/28/2012); polypectomy (N/A, 05/14/2014); Hysteroscopy with D & C (N/A, 05/14/2014); ORIF toe fracture (Left, 02/09/2017);  Amputation (Left, 05/06/2017); I & D extremity (Left, 06/21/2017); Stump revision (Left, 08/31/2017); Cardioversion (N/A, 07/14/2020); Cardioversion (N/A, 09/22/2021); and Cardioversion (N/A, 12/30/2021).   Her family history includes Birth defects in her son; Diabetes in her brother; Heart attack in her mother; Heart disease in her mother; Hypertension in her mother and sister; Obesity in her mother; Sudden death in her mother.She reports that she quit smoking about 11 years ago. Her smoking use included cigarettes. She started smoking about 36 years ago. She has a 25 pack-year smoking history. She has been exposed to tobacco smoke. She has never used smokeless tobacco. She reports that she does not drink alcohol and does not use drugs.    ROS Review of Systems  Constitutional: Negative.   HENT: Negative.    Eyes:  Negative for visual disturbance.  Respiratory:  Negative for shortness of breath.   Cardiovascular:  Negative for chest pain.  Gastrointestinal:  Negative for abdominal pain.  Musculoskeletal:  Negative for arthralgias.    Objective:  BP (!) 88/54   Pulse 80   Temp 98 F (36.7 C)   Ht 5\' 7"  (1.702 m)   Wt 273 lb (123.8 kg)   SpO2 95%   BMI 42.76 kg/m   BP Readings from Last 3 Encounters:  07/20/23 (!) 88/54  06/15/23 102/65  03/23/23 92/62    Wt Readings from Last 3 Encounters:  07/20/23 273 lb (123.8 kg)  06/15/23 278 lb 12.8 oz (126.5 kg)  03/23/23 282 lb (127.9 kg)     Physical Exam Constitutional:      General: She is not in acute distress.    Appearance: She is well-developed.  HENT:     Head: Normocephalic and atraumatic.  Eyes:     Conjunctiva/sclera: Conjunctivae normal.     Pupils: Pupils are equal, round, and reactive to light.  Neck:     Thyroid : No thyromegaly.  Cardiovascular:     Rate and Rhythm: Normal rate and regular rhythm.     Heart sounds: Normal heart sounds. No murmur heard. Pulmonary:     Effort: Pulmonary effort is normal. No  respiratory distress.     Breath sounds: Normal breath sounds. No wheezing or rales.  Abdominal:     General: Bowel sounds are normal. There is no distension.     Palpations: Abdomen is soft.     Tenderness: There is no abdominal tenderness.  Musculoskeletal:        General: Normal range of motion.     Cervical back: Normal range of motion and neck supple.     Comments: Status post left BKA  Lymphadenopathy:     Cervical: No cervical adenopathy.  Skin:    General: Skin is warm and dry.  Neurological:     Mental Status: She is alert and oriented to person, place, and time.  Psychiatric:        Behavior: Behavior normal.        Thought Content: Thought content normal.        Judgment: Judgment normal.      Assessment & Plan:  Essential hypertension -     CBC with Differential/Platelet -     CMP14+EGFR  Mixed hyperlipidemia -     CMP14+EGFR -  Lipid panel -     Fenofibrate ; Take 1 tablet (160 mg total) by mouth daily.  Dispense: 100 tablet; Refill: 0  Morbid obesity (HCC) -     Bayer DCA Hb A1c Waived -     CBC with Differential/Platelet -     Microalbumin / creatinine urine ratio  Leukocytosis, unspecified type -     CBC with Differential/Platelet  Class 3 severe obesity with serious comorbidity and body mass index (BMI) of 50.0 to 59.9 in adult, unspecified obesity type -     Lipid panel  Primary insomnia -     CBC with Differential/Platelet  Chronic systolic (congestive) heart failure (HCC) -     CBC with Differential/Platelet -     CMP14+EGFR -     Lipid panel  Other orders -     Sertraline  HCl; Take 1 tablet (50 mg total) by mouth daily.  Dispense: 100 tablet; Refill: 1 -     Acetaminophen ; Take 2 tablets (1,000 mg total) by mouth 3 (three) times daily.     Follow-up: Return in about 6 months (around 01/20/2024) for [facility] physical.  Roise Cleaver, M.D.

## 2023-07-21 ENCOUNTER — Encounter: Payer: Self-pay | Admitting: Family Medicine

## 2023-07-21 LAB — CBC WITH DIFFERENTIAL/PLATELET
Basos: 1 %
EOS (ABSOLUTE): 0.1 10*3/uL (ref 0.0–0.2)
Eos: 2 %
Hematocrit: 45.4 % (ref 34.0–46.6)
Hemoglobin: 14.7 g/dL (ref 11.1–15.9)
Immature Granulocytes: 0 %
Immature Granulocytes: 0 10*3/uL (ref 0.0–0.1)
Lymphs: 64 %
MCH: 29.8 pg (ref 26.6–33.0)
MCHC: 32.4 g/dL (ref 31.5–35.7)
MCV: 92 fL (ref 79–97)
Monocytes Absolute: 0.4 10*3/uL (ref 0.0–0.4)
Monocytes Absolute: 0.9 10*3/uL (ref 0.1–0.9)
Monocytes: 5 %
Neutrophils Absolute: 10.9 10*3/uL — ABNORMAL HIGH (ref 0.7–3.1)
Neutrophils Absolute: 4.8 10*3/uL (ref 1.4–7.0)
Neutrophils: 28 %
Platelets: 236 10*3/uL (ref 150–450)
RBC: 4.93 x10E6/uL (ref 3.77–5.28)
RDW: 14.2 % (ref 11.7–15.4)
WBC: 17.1 10*3/uL — ABNORMAL HIGH (ref 3.4–10.8)

## 2023-07-21 LAB — CMP14+EGFR
ALT: 12 IU/L (ref 0–32)
AST: 18 IU/L (ref 0–40)
Albumin: 3.9 g/dL (ref 3.9–4.9)
Alkaline Phosphatase: 50 IU/L (ref 44–121)
BUN/Creatinine Ratio: 20 (ref 12–28)
BUN: 22 mg/dL (ref 8–27)
Bilirubin Total: 0.6 mg/dL (ref 0.0–1.2)
CO2: 22 mmol/L (ref 20–29)
Calcium: 9.2 mg/dL (ref 8.7–10.3)
Chloride: 105 mmol/L (ref 96–106)
Creatinine, Ser: 1.12 mg/dL — ABNORMAL HIGH (ref 0.57–1.00)
Globulin, Total: 2.2 g/dL (ref 1.5–4.5)
Glucose: 64 mg/dL — ABNORMAL LOW (ref 70–99)
Potassium: 4.6 mmol/L (ref 3.5–5.2)
Sodium: 140 mmol/L (ref 134–144)
Total Protein: 6.1 g/dL (ref 6.0–8.5)
eGFR: 53 mL/min/{1.73_m2} — ABNORMAL LOW (ref >59)

## 2023-07-21 LAB — MICROALBUMIN / CREATININE URINE RATIO
Creatinine, Urine: 109 mg/dL
Microalb/Creat Ratio: 21 mg/g{creat} (ref 0–29)
Microalbumin, Urine: 22.9 ug/mL

## 2023-07-21 LAB — LIPID PANEL
Cholesterol, Total: 118 mg/dL (ref 100–199)
HDL: 33 mg/dL — ABNORMAL LOW (ref >39)
LDL CALC COMMENT:: 3.6 ratio (ref 0.0–4.4)
LDL Chol Calc (NIH): 69 mg/dL (ref 0–99)
Triglycerides: 77 mg/dL (ref 0–149)
VLDL Cholesterol Cal: 16 mg/dL (ref 5–40)

## 2023-07-23 ENCOUNTER — Other Ambulatory Visit (HOSPITAL_BASED_OUTPATIENT_CLINIC_OR_DEPARTMENT_OTHER): Payer: Self-pay

## 2023-07-24 ENCOUNTER — Other Ambulatory Visit (HOSPITAL_COMMUNITY): Payer: Self-pay | Admitting: Cardiology

## 2023-07-24 ENCOUNTER — Other Ambulatory Visit: Payer: Self-pay | Admitting: Cardiology

## 2023-07-24 DIAGNOSIS — I4819 Other persistent atrial fibrillation: Secondary | ICD-10-CM

## 2023-07-25 NOTE — Telephone Encounter (Signed)
 Prescription refill request for Eliquis  received. Indication:afib Last office visit:1/25 Scr:1.12  5/25 Age: 68 Weight:123.8  kg  Prescription refilled

## 2023-07-27 ENCOUNTER — Other Ambulatory Visit (HOSPITAL_BASED_OUTPATIENT_CLINIC_OR_DEPARTMENT_OTHER): Payer: Self-pay

## 2023-07-27 ENCOUNTER — Ambulatory Visit: Payer: Medicare Other | Admitting: Family Medicine

## 2023-08-19 ENCOUNTER — Other Ambulatory Visit (HOSPITAL_BASED_OUTPATIENT_CLINIC_OR_DEPARTMENT_OTHER): Payer: Self-pay

## 2023-08-24 ENCOUNTER — Other Ambulatory Visit (HOSPITAL_BASED_OUTPATIENT_CLINIC_OR_DEPARTMENT_OTHER): Payer: Self-pay

## 2023-08-28 NOTE — Progress Notes (Unsigned)
 Cardiology Office Note:   Date:  08/31/2023  ID:  Leory Rands, DOB August 17, 1955, MRN [id number] PCP: Roise Cleaver, MD  [facility] Providers Cardiologist:  Eilleen Grates, MD Electrophysiologist:  Will Cortland Ding, MD {  History of Present Illness:   Julia West is a 68 y.o. female who presented for evaluation of CAD.   She has had atrial fib.  She wore a monitor and was noted to be persistent atrial fib with some tachybradycardia rates .  She was in the hospital with HF and rapid atrial fib and she had an EF of 30 - 35%.  She failed Tikosyn  and she was treated with amio.   I switched her to Entresto  at the last visit.  She saw Dr. Mitzie Anda who wanted to do right and left heart cath on her because of her slightly reduced ejection fraction PVCs.  However, this was denied by insurance.  He did increase carvedilol  when he saw her most recently in October 2024.  EF was improved to 45% on echo in Nov 2023.   EF was normal in Jan 2025.       Since she was last seen she has done well. The patient denies any new symptoms such as chest discomfort, neck or arm discomfort. There has been no new shortness of breath, PND or orthopnea. There have been no reported palpitations, presyncope or syncope.     ROS: As stated in the HPI and negative for all other systems.  Studies Reviewed:    EKG:   EKG Interpretation Date/Time:  Wednesday August 31 2023 11:48:43 EDT Ventricular Rate:  89 PR Interval:    QRS Duration:  90 QT Interval:  364 QTC Calculation: 442 R Axis:   -61  Text Interpretation: Atrial fibrillation with premature ventricular or aberrantly conducted complexes Left axis deviation Low voltage QRS Septal infarct (cited on or before 29-Dec-2022) When compared with ECG of 29-Dec-2022 11:59, No significant change was found Confirmed by Eilleen Grates ([id number]) on 08/31/2023 12:05:04 PM     Risk Assessment/Calculations:    CHA2DS2-VASc Score = 6  Should be within just a  minute this indicates a 9.7% annual risk of stroke. The patient's score is based upon: CHF History: 1 HTN History: 1 Diabetes History: 1 Stroke History: 0 Vascular Disease History: 1 Age Score: 1 Gender Score: 1   Physical Exam:   VS:  BP 98/60   Pulse 89   Ht 5' 7 (1.702 m)   Wt 269 lb (122 kg)   BMI 42.13 kg/m    Wt Readings from Last 3 Encounters:  08/31/23 269 lb (122 kg)  07/20/23 273 lb (123.8 kg)  06/15/23 278 lb 12.8 oz (126.5 kg)     GEN: Well nourished, well developed in no acute distress NECK: No JVD; No carotid bruits CARDIAC: Irregular RR, no murmurs, rubs, gallops RESPIRATORY:  Clear to auscultation without rales, wheezing or rhonchi  ABDOMEN: Soft, non-tender, non-distended EXTREMITIES:  No edema; No deformity   ASSESSMENT AND PLAN:   CAD:   The patient has no new sypmtoms.  No further cardiovascular testing is indicated.  We will continue with aggressive risk reduction and meds as listed.  ATRIAL FIB:   Ms. EMILLIA WEATHERLY has a CHA2DS2 - VASc score of 4.  She tolerates anticoagulation.  She is a good rate control.  No change in therapy.  She failed Tikosyn  and amiodarone .    HTN:  The blood pressure is at target.  No change  in therapy.  Is actually running a little low if she gets lightheaded because she has had continued weight loss we would have to back off on her blood pressure medicine.   DYSLIPIDEMIA:   LDL was 69.  No change in therapy.   SLEEP APNEA:    He has lost about 26 pounds and so her CPAP probably is not fitting correctly.  She is going to try some old masks and let me know if she needs to have another sleep study in the future.   CHRONIC SYSTOLIC HF:    EF was normal in January 2025. No change in therapy.   Follow up with me in January of next year  Signed, Eilleen Grates, MD

## 2023-08-31 ENCOUNTER — Ambulatory Visit: Payer: Medicare Other | Admitting: Cardiology

## 2023-08-31 ENCOUNTER — Encounter: Payer: Self-pay | Admitting: Cardiology

## 2023-08-31 VITALS — BP 98/60 | HR 89 | Ht 67.0 in | Wt 269.0 lb

## 2023-08-31 DIAGNOSIS — I10 Essential (primary) hypertension: Secondary | ICD-10-CM | POA: Diagnosis not present

## 2023-08-31 DIAGNOSIS — I5022 Chronic systolic (congestive) heart failure: Secondary | ICD-10-CM

## 2023-08-31 DIAGNOSIS — I2510 Atherosclerotic heart disease of native coronary artery without angina pectoris: Secondary | ICD-10-CM | POA: Diagnosis not present

## 2023-08-31 DIAGNOSIS — E785 Hyperlipidemia, unspecified: Secondary | ICD-10-CM

## 2023-08-31 NOTE — Patient Instructions (Signed)
 Medication Instructions:  Your physician recommends that you continue on your current medications as directed. Please refer to the Current Medication list given to you today.  *If you need a refill on your cardiac medications before your next appointment, please call your pharmacy*  Lab Work: NONE   If you have labs (blood work) drawn today and your tests are completely normal, you will receive your results only by: MyChart Message (if you have MyChart) OR A paper copy in the mail If you have any lab test that is abnormal or we need to change your treatment, we will call you to review the results.  Testing/Procedures: NONE   Follow-Up: At [facility], you and your health needs are our priority.  As part of our continuing mission to provide you with exceptional heart care, our providers are all part of one team.  This team includes your primary Cardiologist (physician) and Advanced Practice Providers or APPs (Physician Assistants and Nurse Practitioners) who all work together to provide you with the care you need, when you need it.  Your next appointment:    January   Provider:   Eilleen Grates, MD    We recommend signing up for the patient portal called MyChart.  Sign up information is provided on this After Visit Summary.  MyChart is used to connect with patients for Virtual Visits (Telemedicine).  Patients are able to view lab/test results, encounter notes, upcoming appointments, etc.  Non-urgent messages can be sent to your provider as well.   To learn more about what you can do with MyChart, go to [email or web address].   Other Instructions Thank you for choosing [facility]!

## 2023-09-04 ENCOUNTER — Telehealth: Admitting: Family Medicine

## 2023-09-04 DIAGNOSIS — H1033 Unspecified acute conjunctivitis, bilateral: Secondary | ICD-10-CM | POA: Diagnosis not present

## 2023-09-04 MED ORDER — OFLOXACIN 0.3 % OP SOLN: 1.0000 [drp] | Freq: Four times a day (QID) | OPHTHALMIC | 0 refills | Status: AC

## 2023-09-04 NOTE — Progress Notes (Signed)
 E-Visit for [employer]   We are sorry that you are not feeling well.  Here is how we plan to help!  Based on what you have shared with me it looks like you have conjunctivitis.  Conjunctivitis is a common inflammatory or infectious condition of the eye that is often referred to as "pink eye".  In most cases it is contagious (viral or bacterial). However, not all conjunctivitis requires antibiotics (ex. Allergic).  We have made appropriate suggestions for you based upon your presentation.  I have prescribed Oflaxacin 1-2 drops 4 times a day times 5 days   Pink eye can be highly contagious.  It is typically spread through direct contact with secretions, or contaminated objects or surfaces that one may have touched.  Strict handwashing is suggested with soap and water is urged.  If not available, use alcohol based had sanitizer.  Avoid unnecessary touching of the eye.  If you wear contact lenses, you will need to refrain from wearing them until you see no white discharge from the eye for at least 24 hours after being on medication.  You should see symptom improvement in 1-2 days after starting the medication regimen.  Call us if symptoms are not improved in 1-2 days.  Home Care: Wash your hands often! Do not wear your contacts until you complete your treatment plan. Avoid sharing towels, bed linen, personal items with a person who has pink eye. See attention for anyone in your home with similar symptoms.  Get Help Right Away If: Your symptoms do not improve. You develop blurred or loss of vision. Your symptoms worsen (increased discharge, pain or redness)   Thank you for choosing an e-visit.  Your e-visit answers were reviewed by a board certified advanced clinical practitioner to complete your personal care plan. Depending upon the condition, your plan could have included both over the counter or prescription medications.  Please review your pharmacy choice. Make sure the pharmacy is open so  you can pick up prescription now. If there is a problem, you may contact your provider through [employer] and have the prescription routed to another pharmacy.  Your safety is important to [place]. If you have drug allergies check your prescription carefully.   For the next 24 hours you can use MyChart to ask questions about today's visit, request a non-urgent call back, or ask for a work or school excuse. You will get an email in the next two days asking about your experience. I hope that your e-visit has been valuable and will speed your recovery.   have provided 5 minutes of non face to face time during this encounter for chart review and documentation.

## 2023-09-19 ENCOUNTER — Other Ambulatory Visit (HOSPITAL_BASED_OUTPATIENT_CLINIC_OR_DEPARTMENT_OTHER): Payer: Self-pay

## 2023-09-27 ENCOUNTER — Other Ambulatory Visit: Payer: Self-pay | Admitting: Family Medicine

## 2023-09-27 DIAGNOSIS — E782 Mixed hyperlipidemia: Secondary | ICD-10-CM

## 2023-10-11 ENCOUNTER — Telehealth (HOSPITAL_COMMUNITY): Payer: Self-pay | Admitting: Cardiology

## 2023-10-11 ENCOUNTER — Other Ambulatory Visit (HOSPITAL_COMMUNITY): Payer: Self-pay

## 2023-10-11 ENCOUNTER — Other Ambulatory Visit (HOSPITAL_BASED_OUTPATIENT_CLINIC_OR_DEPARTMENT_OTHER): Payer: Self-pay

## 2023-10-11 MED ORDER — SACUBITRIL-VALSARTAN 49-51 MG PO TABS: 1.0000 | ORAL_TABLET | Freq: Two times a day (BID) | ORAL | 1 refills | Status: DC

## 2023-10-17 DIAGNOSIS — I25810 Atherosclerosis of coronary artery bypass graft(s) without angina pectoris: Secondary | ICD-10-CM | POA: Diagnosis not present

## 2023-10-17 DIAGNOSIS — I10 Essential (primary) hypertension: Secondary | ICD-10-CM | POA: Diagnosis not present

## 2023-10-17 DIAGNOSIS — I480 Paroxysmal atrial fibrillation: Secondary | ICD-10-CM | POA: Diagnosis not present

## 2023-10-17 DIAGNOSIS — Z89512 Acquired absence of left leg below knee: Secondary | ICD-10-CM | POA: Diagnosis not present

## 2023-10-17 DIAGNOSIS — E782 Mixed hyperlipidemia: Secondary | ICD-10-CM | POA: Diagnosis not present

## 2023-11-16 ENCOUNTER — Other Ambulatory Visit (HOSPITAL_BASED_OUTPATIENT_CLINIC_OR_DEPARTMENT_OTHER): Payer: Self-pay

## 2023-12-06 ENCOUNTER — Other Ambulatory Visit: Payer: Self-pay

## 2023-12-06 ENCOUNTER — Other Ambulatory Visit: Payer: Self-pay | Admitting: Family Medicine

## 2023-12-06 ENCOUNTER — Encounter: Payer: Self-pay | Admitting: Family Medicine

## 2023-12-06 ENCOUNTER — Other Ambulatory Visit (HOSPITAL_COMMUNITY): Payer: Self-pay | Admitting: Cardiology

## 2023-12-06 DIAGNOSIS — E782 Mixed hyperlipidemia: Secondary | ICD-10-CM

## 2023-12-06 NOTE — Telephone Encounter (Signed)
 LEFT MESSAGE TO SCHEDULE 6 MONTH FOLLOW UP IN [place]

## 2023-12-06 NOTE — Telephone Encounter (Signed)
 Stacks NTBS in Nov for a 6 mos FU RFs sent to mail order pharmacy

## 2023-12-07 ENCOUNTER — Ambulatory Visit

## 2023-12-07 MED ORDER — SPIRONOLACTONE 25 MG PO TABS: 25.0000 mg | ORAL_TABLET | Freq: Every day | ORAL | 2 refills | Status: AC

## 2023-12-07 NOTE — Telephone Encounter (Signed)
 Pt of Dr. Lavona. This RX was prescribed and refilled by [facility]. Does Dr. Lavona want to refill this? Please advise.

## 2023-12-08 ENCOUNTER — Other Ambulatory Visit (HOSPITAL_BASED_OUTPATIENT_CLINIC_OR_DEPARTMENT_OTHER): Payer: Self-pay

## 2023-12-21 ENCOUNTER — Telehealth (HOSPITAL_COMMUNITY): Payer: Self-pay

## 2023-12-21 NOTE — Telephone Encounter (Signed)
 Advanced Heart Failure Patient Advocate Encounter  Received notification from [employer] that patient has been conditionally approved for 2026 but needs to submit updated authorization form. Contacted patient by phone, patient consents to advocate signature for authorization.  Re-enrollment form faxed on 12/21/2023.  Rachel DEL, CPhT Rx Patient Advocate Phone: [phone]

## 2023-12-23 ENCOUNTER — Other Ambulatory Visit (HOSPITAL_COMMUNITY): Payer: Self-pay

## 2023-12-23 NOTE — Telephone Encounter (Signed)
 Patient was approved to receive Farxiga  from [employer] Effective 03/15/2024 to 03/14/2025  Discussed 2026 assistance with patient, she is currently enrolled in the MPPP and if she enrolls for MPPP 2026 she may not need assistance for other medications. Patient will contact office if she does not enroll in MPPP 2026 and may need a grant or other assistance programs.

## 2023-12-27 ENCOUNTER — Other Ambulatory Visit: Payer: Self-pay | Admitting: Cardiology

## 2023-12-27 DIAGNOSIS — E782 Mixed hyperlipidemia: Secondary | ICD-10-CM

## 2023-12-28 ENCOUNTER — Other Ambulatory Visit (HOSPITAL_COMMUNITY): Payer: Self-pay | Admitting: Cardiology

## 2023-12-28 MED ORDER — SACUBITRIL-VALSARTAN 49-51 MG PO TABS: 1.0000 | ORAL_TABLET | Freq: Two times a day (BID) | ORAL | 1 refills | Status: AC

## 2024-01-02 ENCOUNTER — Telehealth: Payer: Self-pay | Admitting: Cardiology

## 2024-01-02 NOTE — Telephone Encounter (Signed)
  Per MyChart scheduling message:   I also need a pre approval on my Zepbound  starting in January. I need an appointment in [place] office morning preferably if you have nothing in December I'll have to take January but will need a pre approval for Zepbound  before January. Thank You Lenward Honer

## 2024-01-03 ENCOUNTER — Telehealth: Payer: Self-pay | Admitting: Pharmacy Technician

## 2024-01-03 ENCOUNTER — Other Ambulatory Visit (HOSPITAL_COMMUNITY): Payer: Self-pay

## 2024-01-03 NOTE — Telephone Encounter (Signed)
    Pharmacy Patient Advocate Encounter   Received notification from Pt Calls Messages that prior authorization for zepbound  is required/requested.   Insurance verification completed.   The patient is insured through [facility].   Per test claim: PA required; PA submitted to above mentioned insurance via Latent Key/confirmation #/EOC [id number] Status is pending

## 2024-01-03 NOTE — Telephone Encounter (Signed)
 PA request has been Submitted. New Encounter has been or will be created for follow up. For additional info see Pharmacy Prior Auth telephone encounter from 01/03/24.

## 2024-01-03 NOTE — Telephone Encounter (Signed)
 Pharmacy Patient Advocate Encounter  Received notification from [facility] that Prior Authorization for zepbound  has been APPROVED from 01/03/24 to 03/14/25   PA #/Case ID/Reference #: [id number]

## 2024-01-30 ENCOUNTER — Other Ambulatory Visit (HOSPITAL_BASED_OUTPATIENT_CLINIC_OR_DEPARTMENT_OTHER): Payer: Self-pay

## 2024-02-01 ENCOUNTER — Encounter: Admitting: Family Medicine

## 2024-02-14 ENCOUNTER — Encounter: Payer: Self-pay | Admitting: Cardiology

## 2024-02-14 DIAGNOSIS — I4819 Other persistent atrial fibrillation: Secondary | ICD-10-CM

## 2024-02-15 MED ORDER — APIXABAN 5 MG PO TABS: 5.0000 mg | ORAL_TABLET | Freq: Two times a day (BID) | ORAL | 2 refills | Status: AC

## 2024-02-23 ENCOUNTER — Other Ambulatory Visit (HOSPITAL_COMMUNITY): Payer: Self-pay

## 2024-02-29 ENCOUNTER — Other Ambulatory Visit: Payer: Self-pay | Admitting: Family Medicine

## 2024-03-28 ENCOUNTER — Ambulatory Visit: Admitting: Cardiology

## 2024-06-20 ENCOUNTER — Ambulatory Visit: Admitting: Cardiology

## 2024-06-20 ENCOUNTER — Other Ambulatory Visit

## 2024-06-20 ENCOUNTER — Ambulatory Visit: Admitting: Hematology and Oncology
